# Patient Record
Sex: Female | Born: 1955 | Race: White | Hispanic: No | Marital: Married | Smoking: Current every day smoker
Health system: Southern US, Community
[De-identification: ages and names within clinical notes are randomized; demographics above are authoritative.]

## PROBLEM LIST (undated history)

## (undated) DIAGNOSIS — E049 Nontoxic goiter, unspecified: Secondary | ICD-10-CM

## (undated) DIAGNOSIS — J449 Chronic obstructive pulmonary disease, unspecified: Secondary | ICD-10-CM

## (undated) DIAGNOSIS — I10 Essential (primary) hypertension: Secondary | ICD-10-CM

## (undated) DIAGNOSIS — J45909 Unspecified asthma, uncomplicated: Secondary | ICD-10-CM

## (undated) DIAGNOSIS — N8502 Endometrial intraepithelial neoplasia [EIN]: Secondary | ICD-10-CM

## (undated) DIAGNOSIS — E538 Deficiency of other specified B group vitamins: Secondary | ICD-10-CM

## (undated) DIAGNOSIS — E039 Hypothyroidism, unspecified: Secondary | ICD-10-CM

## (undated) DIAGNOSIS — M109 Gout, unspecified: Secondary | ICD-10-CM

## (undated) DIAGNOSIS — R011 Cardiac murmur, unspecified: Secondary | ICD-10-CM

## (undated) DIAGNOSIS — B379 Candidiasis, unspecified: Secondary | ICD-10-CM

## (undated) HISTORY — DX: Gout, unspecified: M10.9

## (undated) HISTORY — DX: Unspecified asthma, uncomplicated: J45.909

## (undated) HISTORY — PX: APPENDECTOMY: SHX54

## (undated) HISTORY — DX: Essential (primary) hypertension: I10

## (undated) HISTORY — DX: Hypothyroidism, unspecified: E03.9

## (undated) HISTORY — DX: Nontoxic goiter, unspecified: E04.9

## (undated) HISTORY — PX: TONSILLECTOMY: SUR1361

## (undated) HISTORY — DX: Endometrial intraepithelial neoplasia (EIN): N85.02

---

## 1992-12-24 HISTORY — PX: NECK SURGERY: SHX720

## 2003-05-26 ENCOUNTER — Encounter: Payer: Self-pay | Admitting: Family Medicine

## 2003-05-26 ENCOUNTER — Ambulatory Visit (HOSPITAL_COMMUNITY): Admission: RE | Admit: 2003-05-26 | Discharge: 2003-05-26 | Payer: Self-pay | Admitting: Family Medicine

## 2003-06-29 ENCOUNTER — Encounter: Payer: Self-pay | Admitting: Family Medicine

## 2003-06-29 ENCOUNTER — Ambulatory Visit (HOSPITAL_COMMUNITY): Admission: RE | Admit: 2003-06-29 | Discharge: 2003-06-29 | Payer: Self-pay | Admitting: Family Medicine

## 2003-12-29 ENCOUNTER — Ambulatory Visit (HOSPITAL_COMMUNITY): Admission: RE | Admit: 2003-12-29 | Discharge: 2003-12-29 | Payer: Self-pay | Admitting: Family Medicine

## 2007-05-20 ENCOUNTER — Encounter (HOSPITAL_COMMUNITY): Admission: RE | Admit: 2007-05-20 | Discharge: 2007-05-21 | Payer: Self-pay | Admitting: Endocrinology

## 2007-12-25 HISTORY — PX: CHOLECYSTECTOMY: SHX55

## 2012-11-12 ENCOUNTER — Encounter: Payer: Self-pay | Admitting: Gynecologic Oncology

## 2012-11-12 ENCOUNTER — Ambulatory Visit: Payer: Self-pay | Admitting: Gynecologic Oncology

## 2012-11-12 ENCOUNTER — Ambulatory Visit: Payer: BC Managed Care – PPO | Attending: Gynecologic Oncology | Admitting: Gynecologic Oncology

## 2012-11-12 ENCOUNTER — Encounter (HOSPITAL_COMMUNITY): Payer: Self-pay | Admitting: Pharmacy Technician

## 2012-11-12 VITALS — BP 128/76 | HR 78 | Temp 98.4°F | Resp 22 | Ht 66.1 in | Wt 277.0 lb

## 2012-11-12 DIAGNOSIS — E669 Obesity, unspecified: Secondary | ICD-10-CM | POA: Insufficient documentation

## 2012-11-12 DIAGNOSIS — N8501 Benign endometrial hyperplasia: Secondary | ICD-10-CM

## 2012-11-12 DIAGNOSIS — N8502 Endometrial intraepithelial neoplasia [EIN]: Secondary | ICD-10-CM

## 2012-11-12 NOTE — Patient Instructions (Signed)
Return to clinic for pre-op visit.

## 2012-11-12 NOTE — Progress Notes (Signed)
Consult Note: Gyn-Onc  Christina Castaneda 56 y.o. female  CC:  Chief Complaint  Patient presents with  . Atypical Hyperplasia    New patient    HPI: Patient is seen today in consultation at the request of Dr. Tresa Res for complex atypical hyperplasia.  Patient is a 56 year old gravida 1 para 1 who had a son in 51 who she put up for adoption. She been menopausal for about 8 years and never took any hormone replacement therapy. In January of this year she began taking some aspirin intermittently for headaches or discomfort and she at that time noticed that she would have some spotting bleeding or even clotted blood per vagina and that it would then stop. She subsequently saw her primary physician Dr. Luster Landsberg had a Pap smear performed which was unremarkable. He then referred her to Dr. Tresa Res. She was seen by Dr. Tresa Res on November 5 and an endometrial biopsy was performed at that time that revealed complex atypical hyperplasia. It is for this reason that she comes in to see [place] today.  She is otherwise doing quite well and has no concerns. 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 The patient's father is there to be of assistance and hospice is also involved.   Review of Systems: She denies any chest pain, shortness of breath, nausea, vomiting, fevers, chills. She has a change in her bowel bladder habits. She has metastases greater than 4 she can go up a flight of steps. However, she states that she has to take them one step at a time as she continues to get used to her new glasses. She has lost in the last 3-4 years approximately 80 pounds. She states her thyroid has been increasingly improved with regards to regulation she's been able to lose weight though not completely intentionally. She does have a yeast infection on her pannus that she's been taking care of. She has chronic lower extremity edema left greater than right. Review of systems is otherwise negative. Of note she  does smoke a pack per day for the past 2 years. 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  Her last mammogram was 3 years ago her last colonoscopy was 6 years ago.  Current Meds:  Outpatient Encounter Prescriptions as of 11/12/2012  Medication Sig Dispense Refill  . albuterol (PROVENTIL HFA;VENTOLIN HFA) 108 (90 BASE) MCG/ACT inhaler Inhale 2 puffs into the lungs every 6 (six) hours as needed.      . ergocalciferol (VITAMIN D2) 50000 UNITS capsule Take 50,000 Units by mouth once a week.      . Febuxostat (ULORIC) 80 MG TABS Take by mouth daily.      Marland Kitchen l-methylfolate-B6-B12 (METANX) 3-35-2 MG TABS Take 1 tablet by mouth daily.      Marland Kitchen levothyroxine (SYNTHROID, LEVOTHROID) 200 MCG tablet Take 200 mcg by mouth daily.      Marland Kitchen triamterene-hydrochlorothiazide (MAXZIDE) 75-50 MG per tablet Take 1 tablet by mouth daily.        Allergy:  Allergies  Allergen Reactions  . Penicillins Hives, Shortness Of Breath and Swelling    Social Hx:   History   Social History  . Marital Status: Married    Spouse Name: N/A    Number of Children: N/A  . Years of Education: N/A   Occupational History  . Not on file.   Social History Main Topics  . Smoking status: Current  Every Day Smoker -- 1.0 packs/day for 28 years    Types: Cigarettes  . Smokeless tobacco: Not on file  . Alcohol Use: Yes     Comment: "couple beers on the weekend"  . Drug Use: No  . Sexually Active: Not on file   Other Topics Concern  . Not on file   Social History Narrative  . No narrative on file    Past Surgical Hx:  Past Surgical History  Procedure Date  . Cholecystectomy 2009    gallstone removed  . Neck surgery     replace disk  . Tonsillectomy     as a child    Past Medical Hx:  Past Medical History  Diagnosis Date  . Goiter   . Hypothyroidism   . Asthma     mild  . Hypertension   . Atypical endometrial hyperplasia     Family Hx:  Family History  Problem  Relation Age of Onset  . COPD Mother   . Diabetes Mother   . Congestive Heart Failure Mother   . Diabetes Father   . Heart attack Father     Vitals:  Blood pressure 128/76, pulse 78, temperature 98.4 F (36.9 C), temperature source Oral, resp. rate 22, height 5' 6.1" (1.679 m), weight 277 lb (125.646 kg).  Physical Exam:  Rash well-developed female in no acute distress. BMI is 35.  Neck: Supple, no lymphadenopathy, no thyromegaly.  Lungs: Clear to auscultation bilaterally.  Cardiovascular: Regular rate and rhythm.  Abdomen: Morbidly obese large pannus. There is evidence of erythema consistent with yeast under the pannus. There is no palpable masses or hepatosplenomegaly. Exam is limited by habitus. She does have well-healed incisions from her laparoscopic cholecystectomy. There is no obvious abdominal wall hernias.  Groins: No lymphadenopathy.  Extremities: She has 4+ nonpitting edema on the left leg. It is approximately 3+ on the right leg. She has 1+ distal pulses.  Pelvic: Normal external female genitalia. Vagina is atrophic. The cervix is visualized portions of prominent anterior lip the cervix is no visible lesions. Bimanual examination the corpus is not markedly enlarged and there are no adnexal masses. However, exam is limited by habitus.  Assessment/Plan: 56-year-old gravida 1 para 1 with postmenopausal bleeding. Endometrial biopsy of a complex atypical hyperplasia. She had previously discussed definitive surgery with Dr. Phylliss Bob mind in this is how she would like to proceed at this time. She understands that we'll proceed with a total hysterectomy bilateral salpingo-oophorectomy. While she is asleep the uterus we sent for frozen section. If there is either no evidence of cancer or cancer has less than 50% myometrial invasion will conclude the procedure. If there is a higher grade of cancer or there's greater than 50% myometrial invasion we'll need to proceed with attempt at  lymphadenectomy. She understands we'll attempt to do this in a minimally invasive fashion. The limitations to minimally invasive surgery that could decrease our success in performing a minimally invasive fashion include her morbid obesity as well as her smoking. I discussed with her the need for Trendelenburg position how this can be an issue in smokers who have reactive airway disease. She states that she can quit smoking tomorrow prior to her surgery. I also counseled her to stop taking any aspirin products prior surgery.  She understands as stated above that we'll attempt to proceed with surgery in a minimally invasive fashion. Should we not be able to the surgery via MIA, we will proceed with a vertical midline incision above  the area of erythema. Risks of the surgery included but are not limited to bleeding infection cervical and treated surrounding organs and thromboembolic disease. She understands the need for home Lovenox will depend on what type of incision was made and whether or not she has cancer. Her questions as well as those of her husband were elicited in answer to her satisfaction. She'll go to her pre-care system. She has our card and will call us if she has any questions prior to her surgery. Her surgery is tentatively scheduled for November 26.  Anel Creighton A., MD 11/12/2012, 12:27 PM

## 2012-11-14 ENCOUNTER — Encounter (HOSPITAL_COMMUNITY): Payer: Self-pay

## 2012-11-14 ENCOUNTER — Other Ambulatory Visit (HOSPITAL_COMMUNITY): Payer: Self-pay | Admitting: *Deleted

## 2012-11-14 ENCOUNTER — Other Ambulatory Visit: Payer: Self-pay | Admitting: *Deleted

## 2012-11-14 ENCOUNTER — Encounter (HOSPITAL_COMMUNITY)
Admission: RE | Admit: 2012-11-14 | Discharge: 2012-11-14 | Disposition: A | Discharge status: Home / Self Care | Payer: BC Managed Care – PPO | Source: Ambulatory Visit | Attending: Obstetrics & Gynecology | Admitting: Obstetrics & Gynecology

## 2012-11-14 ENCOUNTER — Ambulatory Visit (HOSPITAL_COMMUNITY)
Admission: RE | Admit: 2012-11-14 | Discharge: 2012-11-14 | Disposition: A | Discharge status: Home / Self Care | Payer: BC Managed Care – PPO | Source: Ambulatory Visit | Attending: Gynecologic Oncology | Admitting: Gynecologic Oncology

## 2012-11-14 DIAGNOSIS — Z01818 Encounter for other preprocedural examination: Secondary | ICD-10-CM | POA: Insufficient documentation

## 2012-11-14 HISTORY — DX: Deficiency of other specified B group vitamins: E53.8

## 2012-11-14 HISTORY — DX: Candidiasis, unspecified: B37.9

## 2012-11-14 HISTORY — DX: Cardiac murmur, unspecified: R01.1

## 2012-11-14 LAB — COMPREHENSIVE METABOLIC PANEL
ALT: 17 U/L (ref 0–35)
Alkaline Phosphatase: 69 U/L (ref 39–117)
BUN: 15 mg/dL (ref 6–23)
CO2: 28 mEq/L (ref 19–32)
GFR calc Af Amer: 57 mL/min — ABNORMAL LOW (ref >90)
GFR calc non Af Amer: 49 mL/min — ABNORMAL LOW (ref >90)
Glucose, Bld: 78 mg/dL (ref 70–99)
Potassium: 3.7 mEq/L (ref 3.5–5.1)
Total Bilirubin: 0.4 mg/dL (ref 0.3–1.2)
Total Protein: 7.4 g/dL (ref 6.0–8.3)

## 2012-11-14 LAB — CBC WITH DIFFERENTIAL/PLATELET
Eosinophils Absolute: 0.4 10*3/uL (ref 0.0–0.7)
Hemoglobin: 16.3 g/dL — ABNORMAL HIGH (ref 12.0–15.0)
Lymphocytes Relative: 27 % (ref 12–46)
Lymphs Abs: 2.5 10*3/uL (ref 0.7–4.0)
MCH: 30.8 pg (ref 26.0–34.0)
MCV: 90.9 fL (ref 78.0–100.0)
Monocytes Relative: 5 % (ref 3–12)
Neutrophils Relative %: 64 % (ref 43–77)
RBC: 5.29 MIL/uL — ABNORMAL HIGH (ref 3.87–5.11)
WBC: 9.4 10*3/uL (ref 4.0–10.5)

## 2012-11-14 LAB — SURGICAL PCR SCREEN: MRSA, PCR: NEGATIVE

## 2012-11-14 LAB — TYPE AND SCREEN: ABO/RH(D): O POS

## 2012-11-14 NOTE — Progress Notes (Signed)
Last office visit 04/21/12 note Dellie Catholic PA from [facility] cardiology on chart, ECHO 03/23/11 [facility] on chart.

## 2012-11-14 NOTE — Patient Instructions (Addendum)
20 CAELIE [address]URG  11/14/2012   Your procedure is scheduled on:  11-18-12  Report to [id number]  AM.  Call this number if you have problems the morning of surgery: [phone]   Remember:   Do not eat food :After Midnight. Sunday night.               Clear liquids all day Monday 11-17-2012 and clear liquids day of surgery 11-18-12 until 0745 am, then nothing.  .  Take these medicines the morning of surgery with A SIP OF WATER: synthroid, uloric albuterol inhaler if needed and bring inhaler   Do not wear jewelry or make up.  Do not wear lotions, powders, or perfumes. You may wear deodorant.    Do not bring valuables to the hospital.  Contacts, dentures or bridgework may not be worn into surgery.  Leave suitcase in the car. After surgery it may be brought to your room.  For patients admitted to the hospital, checkout time is 11:00 AM the day of discharge                             Patients discharged the day of surgery will not be allowed to drive home. If going home same day of surgery, you must have someone stay with you the first 24 hours at home and arrange for some one to drive you home from hospital.    Special Instructions: See [facility] Preparing for Surgery instruction sheet. Women do not shave legs or underarms for 12 hours before showers. Men may shave face morning of surgery.    Please read over the following fact sheets that you were given: MRSA Information, blood fact sheet, incentive spirometer fact sheet, clear liquid sheet.  Cain Sieve [facility] pre op nurse phone number [phone], call if needed

## 2012-11-14 NOTE — Progress Notes (Signed)
11/14/12 1002  OBSTRUCTIVE SLEEP APNEA  Have you ever been diagnosed with sleep apnea through a sleep study? No  Do you snore loudly (loud enough to be heard through closed doors)?  0  Do you often feel tired, fatigued, or sleepy during the daytime? 1  Has anyone observed you stop breathing during your sleep? 0  Do you have, or are you being treated for high blood pressure? 1  BMI more than 35 kg/m2? 1  Age over 58 years old? 1  Neck circumference greater than 40 cm/18 inches? 0  Gender: 0  Obstructive Sleep Apnea Score 4   Score 4 or greater  Results sent to PCP

## 2012-11-18 ENCOUNTER — Inpatient Hospital Stay (HOSPITAL_COMMUNITY): Payer: BC Managed Care – PPO | Admitting: Anesthesiology

## 2012-11-18 ENCOUNTER — Ambulatory Visit (HOSPITAL_COMMUNITY)
Admission: RE | Admit: 2012-11-18 | Discharge: 2012-11-19 | Disposition: A | Discharge status: Home / Self Care | Payer: BC Managed Care – PPO | Source: Ambulatory Visit | Attending: Obstetrics & Gynecology | Admitting: Obstetrics & Gynecology

## 2012-11-18 ENCOUNTER — Encounter (HOSPITAL_COMMUNITY)
Admission: RE | Disposition: A | Discharge status: Home / Self Care | Payer: Self-pay | Source: Ambulatory Visit | Attending: Obstetrics & Gynecology

## 2012-11-18 ENCOUNTER — Encounter (HOSPITAL_COMMUNITY): Payer: Self-pay | Admitting: *Deleted

## 2012-11-18 ENCOUNTER — Encounter (HOSPITAL_COMMUNITY): Payer: Self-pay | Admitting: Anesthesiology

## 2012-11-18 DIAGNOSIS — N7013 Chronic salpingitis and oophoritis: Secondary | ICD-10-CM | POA: Insufficient documentation

## 2012-11-18 DIAGNOSIS — N8000 Endometriosis of the uterus, unspecified: Secondary | ICD-10-CM | POA: Insufficient documentation

## 2012-11-18 DIAGNOSIS — E039 Hypothyroidism, unspecified: Secondary | ICD-10-CM | POA: Insufficient documentation

## 2012-11-18 DIAGNOSIS — I10 Essential (primary) hypertension: Secondary | ICD-10-CM | POA: Insufficient documentation

## 2012-11-18 DIAGNOSIS — J4489 Other specified chronic obstructive pulmonary disease: Secondary | ICD-10-CM | POA: Insufficient documentation

## 2012-11-18 DIAGNOSIS — J449 Chronic obstructive pulmonary disease, unspecified: Secondary | ICD-10-CM | POA: Insufficient documentation

## 2012-11-18 DIAGNOSIS — N8501 Benign endometrial hyperplasia: Secondary | ICD-10-CM

## 2012-11-18 DIAGNOSIS — Z23 Encounter for immunization: Secondary | ICD-10-CM | POA: Insufficient documentation

## 2012-11-18 DIAGNOSIS — C549 Malignant neoplasm of corpus uteri, unspecified: Principal | ICD-10-CM | POA: Insufficient documentation

## 2012-11-18 DIAGNOSIS — N135 Crossing vessel and stricture of ureter without hydronephrosis: Secondary | ICD-10-CM | POA: Insufficient documentation

## 2012-11-18 DIAGNOSIS — Z79899 Other long term (current) drug therapy: Secondary | ICD-10-CM | POA: Insufficient documentation

## 2012-11-18 HISTORY — PX: ROBOTIC ASSISTED TOTAL HYSTERECTOMY WITH BILATERAL SALPINGO OOPHERECTOMY: SHX6086

## 2012-11-18 SURGERY — ROBOTIC ASSISTED TOTAL HYSTERECTOMY WITH BILATERAL SALPINGO OOPHORECTOMY
Anesthesia: General | Wound class: Clean Contaminated

## 2012-11-18 MED ORDER — ZOLPIDEM TARTRATE 5 MG PO TABS: 5.0000 mg | ORAL_TABLET | Freq: Every evening | ORAL | Status: DC | PRN

## 2012-11-18 MED ORDER — HYDROMORPHONE HCL PF 1 MG/ML IJ SOLN
0.2500 mg | INTRAMUSCULAR | Status: DC | PRN
  Administered 2012-11-18 (×2): 0.5 mg via INTRAVENOUS

## 2012-11-18 MED ORDER — LACTATED RINGERS IV SOLN
INTRAVENOUS | Status: DC
  Administered 2012-11-18 (×2): via INTRAVENOUS
  Administered 2012-11-18: 1000 mL via INTRAVENOUS

## 2012-11-18 MED ORDER — MORPHINE SULFATE 2 MG/ML IJ SOLN
2.0000 mg | INTRAMUSCULAR | Status: DC | PRN
  Administered 2012-11-18: 2 mg via INTRAVENOUS
  Filled 2012-11-18: qty 1

## 2012-11-18 MED ORDER — METRONIDAZOLE IN NACL 5-0.79 MG/ML-% IV SOLN
500.0000 mg | INTRAVENOUS | Status: AC
  Administered 2012-11-18: 500 mg via INTRAVENOUS

## 2012-11-18 MED ORDER — GLYCOPYRROLATE 0.2 MG/ML IJ SOLN
INTRAMUSCULAR | Status: DC | PRN
  Administered 2012-11-18: .6 mg via INTRAVENOUS

## 2012-11-18 MED ORDER — ALBUTEROL SULFATE HFA 108 (90 BASE) MCG/ACT IN AERS
INHALATION_SPRAY | RESPIRATORY_TRACT | Status: AC
  Filled 2012-11-18: qty 6.7

## 2012-11-18 MED ORDER — SUCCINYLCHOLINE CHLORIDE 20 MG/ML IJ SOLN
INTRAMUSCULAR | Status: DC | PRN
  Administered 2012-11-18: 100 mg via INTRAVENOUS

## 2012-11-18 MED ORDER — MEPERIDINE HCL 50 MG/ML IJ SOLN: 6.2500 mg | INTRAMUSCULAR | Status: DC | PRN

## 2012-11-18 MED ORDER — ACETAMINOPHEN 10 MG/ML IV SOLN: 1000.0000 mg | Freq: Once | INTRAVENOUS | Status: DC | PRN

## 2012-11-18 MED ORDER — HYDROMORPHONE HCL PF 1 MG/ML IJ SOLN
INTRAMUSCULAR | Status: AC
  Filled 2012-11-18: qty 1

## 2012-11-18 MED ORDER — PNEUMOCOCCAL VAC POLYVALENT 25 MCG/0.5ML IJ INJ
0.5000 mL | INJECTION | INTRAMUSCULAR | Status: DC
  Filled 2012-11-18: qty 0.5

## 2012-11-18 MED ORDER — ONDANSETRON HCL 4 MG PO TABS: 4.0000 mg | ORAL_TABLET | Freq: Four times a day (QID) | ORAL | Status: DC | PRN

## 2012-11-18 MED ORDER — ALBUTEROL SULFATE HFA 108 (90 BASE) MCG/ACT IN AERS
2.0000 | INHALATION_SPRAY | Freq: Four times a day (QID) | RESPIRATORY_TRACT | Status: DC | PRN
  Filled 2012-11-18: qty 6.7

## 2012-11-18 MED ORDER — MORPHINE SULFATE 10 MG/ML IJ SOLN: 2.0000 mg | INTRAMUSCULAR | Status: DC | PRN

## 2012-11-18 MED ORDER — ALBUTEROL SULFATE HFA 108 (90 BASE) MCG/ACT IN AERS
INHALATION_SPRAY | RESPIRATORY_TRACT | Status: DC | PRN
  Administered 2012-11-18: 2 via RESPIRATORY_TRACT

## 2012-11-18 MED ORDER — LACTATED RINGERS IV SOLN
INTRAVENOUS | Status: DC | PRN
  Administered 2012-11-18: 500 mL via INTRAVENOUS

## 2012-11-18 MED ORDER — KCL IN DEXTROSE-NACL 20-5-0.45 MEQ/L-%-% IV SOLN
INTRAVENOUS | Status: DC
  Administered 2012-11-18 – 2012-11-19 (×2): via INTRAVENOUS
  Filled 2012-11-18 (×4): qty 1000

## 2012-11-18 MED ORDER — ONDANSETRON HCL 4 MG/2ML IJ SOLN
INTRAMUSCULAR | Status: DC | PRN
  Administered 2012-11-18: 4 mg via INTRAVENOUS

## 2012-11-18 MED ORDER — CIPROFLOXACIN IN D5W 400 MG/200ML IV SOLN
400.0000 mg | INTRAVENOUS | Status: AC
  Administered 2012-11-18: 400 mg via INTRAVENOUS

## 2012-11-18 MED ORDER — OXYCODONE HCL 5 MG PO TABS
5.0000 mg | ORAL_TABLET | Freq: Once | ORAL | Status: DC | PRN
Start: 2012-11-18 — End: 2012-11-18

## 2012-11-18 MED ORDER — ACETAMINOPHEN 10 MG/ML IV SOLN
INTRAVENOUS | Status: DC | PRN
  Administered 2012-11-18: 1000 mg via INTRAVENOUS

## 2012-11-18 MED ORDER — ROCURONIUM BROMIDE 100 MG/10ML IV SOLN: INTRAVENOUS | Status: DC | PRN

## 2012-11-18 MED ORDER — OXYCODONE-ACETAMINOPHEN 5-325 MG PO TABS
1.0000 | ORAL_TABLET | ORAL | Status: DC | PRN
  Administered 2012-11-19 (×2): 2 via ORAL
  Filled 2012-11-18 (×2): qty 2

## 2012-11-18 MED ORDER — PROMETHAZINE HCL 25 MG/ML IJ SOLN: 6.2500 mg | INTRAMUSCULAR | Status: DC | PRN

## 2012-11-18 MED ORDER — FENTANYL CITRATE 0.05 MG/ML IJ SOLN
INTRAMUSCULAR | Status: DC | PRN
  Administered 2012-11-18 (×3): 50 ug via INTRAVENOUS
  Administered 2012-11-18: 100 ug via INTRAVENOUS
  Administered 2012-11-18 (×3): 50 ug via INTRAVENOUS

## 2012-11-18 MED ORDER — KETOROLAC TROMETHAMINE 30 MG/ML IJ SOLN
30.0000 mg | Freq: Four times a day (QID) | INTRAMUSCULAR | Status: AC
  Filled 2012-11-18 (×2): qty 1

## 2012-11-18 MED ORDER — NEOSTIGMINE METHYLSULFATE 1 MG/ML IJ SOLN
INTRAMUSCULAR | Status: DC | PRN
  Administered 2012-11-18: 4 mg via INTRAVENOUS

## 2012-11-18 MED ORDER — PROPOFOL 10 MG/ML IV BOLUS
INTRAVENOUS | Status: DC | PRN
  Administered 2012-11-18: 150 mg via INTRAVENOUS

## 2012-11-18 MED ORDER — DEXAMETHASONE SODIUM PHOSPHATE 10 MG/ML IJ SOLN
INTRAMUSCULAR | Status: DC | PRN
  Administered 2012-11-18: 10 mg via INTRAVENOUS

## 2012-11-18 MED ORDER — ONDANSETRON HCL 4 MG/2ML IJ SOLN: 4.0000 mg | Freq: Four times a day (QID) | INTRAMUSCULAR | Status: DC | PRN

## 2012-11-18 MED ORDER — KETOROLAC TROMETHAMINE 30 MG/ML IJ SOLN
30.0000 mg | Freq: Four times a day (QID) | INTRAMUSCULAR | Status: AC
  Administered 2012-11-18 – 2012-11-19 (×3): 30 mg via INTRAVENOUS
  Filled 2012-11-18: qty 1

## 2012-11-18 MED ORDER — MIDAZOLAM HCL 5 MG/5ML IJ SOLN
INTRAMUSCULAR | Status: DC | PRN
  Administered 2012-11-18: 2 mg via INTRAVENOUS

## 2012-11-18 MED ORDER — ROCURONIUM BROMIDE 100 MG/10ML IV SOLN
INTRAVENOUS | Status: DC | PRN
  Administered 2012-11-18: 10 mg via INTRAVENOUS
  Administered 2012-11-18: 5 mg via INTRAVENOUS
  Administered 2012-11-18: 50 mg via INTRAVENOUS

## 2012-11-18 MED ORDER — OXYCODONE HCL 5 MG/5ML PO SOLN
5.0000 mg | Freq: Once | ORAL | Status: DC | PRN
  Filled 2012-11-18: qty 5

## 2012-11-18 SURGICAL SUPPLY — 51 items
APL SKNCLS STERI-STRIP NONHPOA (GAUZE/BANDAGES/DRESSINGS) ×1
BAG SPEC RTRVL LRG 6X4 10 (ENDOMECHANICALS) ×2
BENZOIN TINCTURE PRP APPL 2/3 (GAUZE/BANDAGES/DRESSINGS) ×2 IMPLANT
CHLORAPREP W/TINT 26ML (MISCELLANEOUS) ×3 IMPLANT
CLOTH BEACON ORANGE TIMEOUT ST (SAFETY) ×2 IMPLANT
CORD HIGH FREQUENCY UNIPOLAR (ELECTROSURGICAL) ×2 IMPLANT
CORDS BIPOLAR (ELECTRODE) ×1 IMPLANT
COVER MAYO STAND STRL (DRAPES) ×2 IMPLANT
COVER SURGICAL LIGHT HANDLE (MISCELLANEOUS) ×2 IMPLANT
COVER TIP SHEARS 8 DVNC (MISCELLANEOUS) ×1 IMPLANT
COVER TIP SHEARS 8MM DA VINCI (MISCELLANEOUS) ×1
DECANTER SPIKE VIAL GLASS SM (MISCELLANEOUS) ×2 IMPLANT
DRAPE LG THREE QUARTER DISP (DRAPES) ×4 IMPLANT
DRAPE SURG IRRIG POUCH 19X23 (DRAPES) ×2 IMPLANT
DRAPE TABLE BACK 44X90 PK DISP (DRAPES) ×3 IMPLANT
DRAPE UTILITY XL STRL (DRAPES) ×2 IMPLANT
DRAPE WARM FLUID 44X44 (DRAPE) ×2 IMPLANT
DRSG TEGADERM 6X8 (GAUZE/BANDAGES/DRESSINGS) ×4 IMPLANT
ELECT REM PT RETURN 9FT ADLT (ELECTROSURGICAL) ×2
ELECTRODE REM PT RTRN 9FT ADLT (ELECTROSURGICAL) ×1 IMPLANT
GAUZE VASELINE 3X9 (GAUZE/BANDAGES/DRESSINGS) IMPLANT
GLOVE BIO SURGEON STRL SZ 6.5 (GLOVE) ×8 IMPLANT
GLOVE BIO SURGEON STRL SZ7.5 (GLOVE) ×4 IMPLANT
GLOVE BIOGEL PI IND STRL 7.0 (GLOVE) ×2 IMPLANT
GLOVE BIOGEL PI INDICATOR 7.0 (GLOVE) ×2
GOWN PREVENTION PLUS XLARGE (GOWN DISPOSABLE) ×10 IMPLANT
HOLDER FOLEY CATH W/STRAP (MISCELLANEOUS) ×2 IMPLANT
KIT ACCESSORY DA VINCI DISP (KITS) ×1
KIT ACCESSORY DVNC DISP (KITS) ×1 IMPLANT
MANIPULATOR UTERINE 4.5 ZUMI (MISCELLANEOUS) ×2 IMPLANT
OCCLUDER COLPOPNEUMO (BALLOONS) ×2 IMPLANT
PACK LAPAROSCOPY W LONG (CUSTOM PROCEDURE TRAY) ×2 IMPLANT
POUCH SPECIMEN RETRIEVAL 10MM (ENDOMECHANICALS) ×4 IMPLANT
SET TUBE IRRIG SUCTION NO TIP (IRRIGATION / IRRIGATOR) ×2 IMPLANT
SHEET LAVH (DRAPES) ×2 IMPLANT
SOLUTION ELECTROLUBE (MISCELLANEOUS) ×2 IMPLANT
SPONGE LAP 18X18 X RAY DECT (DISPOSABLE) IMPLANT
STRIP CLOSURE SKIN 1/2X4 (GAUZE/BANDAGES/DRESSINGS) ×2 IMPLANT
SUT VIC AB 0 CT1 27 (SUTURE) ×6
SUT VIC AB 0 CT1 27XBRD ANTBC (SUTURE) ×3 IMPLANT
SUT VIC AB 4-0 PS2 27 (SUTURE) ×4 IMPLANT
SUT VICRYL 0 UR6 27IN ABS (SUTURE) ×2 IMPLANT
SYR 50ML LL SCALE MARK (SYRINGE) ×2 IMPLANT
SYR BULB IRRIGATION 50ML (SYRINGE) IMPLANT
TOWEL NATURAL 10PK STERILE (DISPOSABLE) ×2 IMPLANT
TOWEL OR NON WOVEN STRL DISP B (DISPOSABLE) ×1 IMPLANT
TRAP SPECIMEN MUCOUS 40CC (MISCELLANEOUS) ×2 IMPLANT
TRAY FOLEY CATH 14FRSI W/METER (CATHETERS) ×2 IMPLANT
TROCAR XCEL 12X100 BLDLESS (ENDOMECHANICALS) ×2 IMPLANT
TROCAR XCEL BLADELESS 5X75MML (TROCAR) ×2 IMPLANT
WATER STERILE IRR 1500ML POUR (IV SOLUTION) ×4 IMPLANT

## 2012-11-18 NOTE — Op Note (Signed)
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 Duard Brady, MD; Antionette Char, MD   Assistant: Telford Nab   Anesthesia: General   Estimated blood loss: 50 ml   IVF: 1500 ml   Urine output: 100 ml   Complications: None   Pathology: Uterus, cervix, bilateral tubes and ovaries  Operative findings: Bilateral hydrosalpinges. The left measured approximately 6 cm in the right approximately 4 cm. Significant adhesive disease of the omentum to the left adnexa and the posterior cul-de-sac. Endometriotic implant on the right pelvic sidewall. 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 Normal size uterus. Significant intra-abdominal obesity. 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   Procedure: The patient was identified in the preoperative holding area. Informed consent was signed on the chart. Patient was seen history was reviewed and exam was performed.   The patient was then taken to the operating room and placed in the supine position with SCD hose on. General anesthesia was then induced without difficulty. She was then placed in the dorsolithotomy position. Her arms were tucked at her side with appropriate precautions on the gel pad and sleds. Shoulder blocks were then placed in the usual fashion with appropriate precautions. A OG-tube was placed to suction. First timeout was performed to confirm the patient procedure antibiotic allergy status, estimated estimated blood loss and OR time. The perineum was then prepped in the usual fashion with  Betadine. A 14 French Foley was inserted into the bladder under sterile conditions. A sterile speculum was placed in the vagina. The cervix was without lesions. The cervix was grasped with a single-tooth tenaculum. The dilator without difficulty. A ZUMI with a large Koe ring was placed without difficulty. The abdomen was then prepped with 2 Chlor prep sponges per protocol.  Patient was then draped after the prep was dried.   Second timeout was performed to confirm the above. After again confirming OG tube placement and it was to suction. A stab-wound was made in left upper quadrant 2 cm below the costal margin on the left in the midclavicular line. A 5 mm operative report was used to assure intra-abdominal placement. The abdomen was insufflated. At this point all points during the procedure the patient's intra-abdominal pressure was not increased over 15 mm of mercury. After insufflation was complete, the patient was placed in deep Trendelenburg position. 25 cm above the pubic symphysis that area was marked the camera port. Bilateral robotic ports were marked 10 cm from the midline incision at approximately 5 angle and a fourth trochars placed approximately 8 cm away from the third arm in a straight line. Under direct visualization each of the trochars was placed into the abdomen. We could not fold the small bowel back on its mesentery secondary to the significant adhesive disease of the omentum to the pelvis. The 5 mm LUQ port was then converted to a 10/12 port under direct visualization.  After assuring adequate visualization, the robot was then docked in the usual fashion. Under direct visualization the robotic instruments replaced.   Using careful dissection the omentum was dissected off the descending colon, the left adnexa, the left hydrosalpinx, the posterior cul-de-sac, and the right ovary. The descending  colon was then dissected free of the left lateral side wall and the left adnexa. The round  ligament on the patient's left side was transected with monopolar cautery. The anterior and posterior leaves of the broad ligament were then taken down in the usual fashion. The ureter was identified on the medial leaf of the broad ligament. A window was made between the IP and the ureter. The IP was coagulated with bipolar cautery and transected. The posterior leaf of the broad ligament was taken down to the level of the KOH ring. The bladder flap was created using meticulous dissection and pinpoint cautery. The uterine vessels were coagulated with bipolar cautery. The uterine vessels were then transected and the C loop was created. The same procedure was performed on the patient's right side except that prior to making a window between the ureter and the infundibulopelvic vessels,the ureter was dissected off the medial leaf of the broad ligament. The paravesical space on the patient's right side was opened to assist with the identification of the ureter. The ovary was densely adherent to the medial leaf of the peritoneum and the posterior ovarian fossa. This was dissected free to ensure visualization of the ureter at all times. At this point and the window could be created between the ureter and the infundibulopelvic vessels to. The vessels were then cauterized and transected. The bladder flap was completed anteriorly. The uterine vessels were skeletonized on the patient's right side and then coagulated with bipolar cautery. The uterine vessels were transected and the C-loop was created.  The pneumo-occulder in the vagina was then insufflated. The colpotomy was then created in the usual fashion. The specimen was then delivered to the vagina and sent for frozen section.  The vaginal cuff was closed with a running 0 Vicryl on CT 1 suture. At this point frozen section returned as minimal myometrial invasion of grade 1 lesion. The abdomen and pelvis were copiously irrigated and noted to be hemostatic.   The  robotic instruments were removed under direct visualization as were the robotic trochars. The pneumoperitoneum was removed. The patient was then taken out of the Trendelenburg position. Anesthesia gave the patient deep breath took spell the CO2 gas in the abdomen.  S retractors were used to identify the fascia the midline port. It was grasped with Allis clamps and closed in a figure-of-eight fashion with 0 Vicryl on a UR 6 needle. The left upper quadrant subcutaneous tissues were reapproximated with 0 Vicryl on a UR 6.The skin was closed using 4-0 Vicryl. Steri-Strips and benzoin were applied. The pneumo occluded balloon was removed from the vagina. The vagina was swabbed and noted to be hemostatic.    All instrument needle and Ray-Tec counts were correct x2. The patient tolerated the procedure well and was taken to the recovery room in stable condition. This is Lynsey Ange dictating an operative note on patient Samar Cleaton Sundell.

## 2012-11-18 NOTE — Anesthesia Preprocedure Evaluation (Addendum)
Anesthesia Evaluation  Patient identified by MRN, date of birth, ID band Patient awake    Reviewed: Allergy & Precautions, H&P , NPO status , Patient's Chart, lab work & pertinent test results  Airway Mallampati: II TM Distance: >3 FB Neck ROM: Full    Dental  (+) Dental Advisory Given, Teeth Intact and Missing   Pulmonary asthma , COPD COPD inhaler,  breath sounds clear to auscultation  Pulmonary exam normal       Cardiovascular hypertension, Pt. on medications - CAD and - Past MI Rhythm:Regular Rate:Normal + Systolic murmurs    Neuro/Psych negative neurological ROS  negative psych ROS   GI/Hepatic negative GI ROS, Neg liver ROS,   Endo/Other  Hypothyroidism Morbid obesity  Renal/GU negative Renal ROS     Musculoskeletal negative musculoskeletal ROS (+)   Abdominal (+) + obese,   Peds  Hematology negative hematology ROS (+)   Anesthesia Other Findings   Reproductive/Obstetrics                        Anesthesia Physical Anesthesia Plan  ASA: III  Anesthesia Plan: General   Post-op Pain Management:    Induction: Intravenous  Airway Management Planned: Oral ETT  Additional Equipment:   Intra-op Plan:   Post-operative Plan: Extubation in OR  Informed Consent: I have reviewed the patients History and Physical, chart, labs and discussed the procedure including the risks, benefits and alternatives for the proposed anesthesia with the patient or authorized representative who has indicated his/her understanding and acceptance.   Dental advisory given  Plan Discussed with: CRNA  Anesthesia Plan Comments:         Anesthesia Quick Evaluation

## 2012-11-18 NOTE — Anesthesia Postprocedure Evaluation (Signed)
Anesthesia Post Note  Patient: Christina Castaneda  Procedure(s) Performed: Procedure(s) (LRB): ROBOTIC ASSISTED TOTAL HYSTERECTOMY WITH BILATERAL SALPINGO OOPHORECTOMY (N/A)  Anesthesia type: General  Patient location: PACU  Post pain: Pain level controlled  Post assessment: Post-op Vital signs reviewed  Last Vitals: BP 111/47  Pulse 43  Temp 36.3 C (Oral)  Resp 13  SpO2 96%  Post vital signs: Reviewed  Level of consciousness: sedated  Complications: No apparent anesthesia complications

## 2012-11-18 NOTE — Interval H&P Note (Signed)
History and Physical Interval Note:  11/18/2012 12:16 PM  Christina Castaneda  has presented today for surgery, with the diagnosis of complex hyperplasia  The various methods of treatment have been discussed with the patient and family. After consideration of risks, benefits and other options for treatment, the patient has consented to  Procedure(s) (LRB) with comments: ROBOTIC ASSISTED TOTAL HYSTERECTOMY WITH BILATERAL SALPINGO OOPHORECTOMY (N/A) as a surgical intervention .  The patient's history has been reviewed, patient examined, no change in status, stable for surgery.  I have reviewed the patient's chart and labs.  Questions were answered to the patient's satisfaction.     Kegan Shepardson A.

## 2012-11-18 NOTE — Transfer of Care (Signed)
Immediate Anesthesia Transfer of Care Note  Patient: Christina Castaneda  Procedure(s) Performed: Procedure(s) (LRB) with comments: ROBOTIC ASSISTED TOTAL HYSTERECTOMY WITH BILATERAL SALPINGO OOPHORECTOMY (N/A)  Patient Location: PACU  Anesthesia Type:General  Level of Consciousness: awake, alert , oriented and patient cooperative  Airway & Oxygen Therapy: Patient Spontanous Breathing and Patient connected to face mask oxygen  Post-op Assessment: Report given to PACU RN and Post -op Vital signs reviewed and stable  Post vital signs: Reviewed and stable  Complications: No apparent anesthesia complications

## 2012-11-18 NOTE — Preoperative (Signed)
Beta Blockers   Reason not to administer Beta Blockers:Not Applicable 

## 2012-11-18 NOTE — H&P (View-Only) (Signed)
Consult Note: Gyn-Onc  Christina Castaneda 56 y.o. female  CC:  Chief Complaint  Patient presents with  . Atypical Hyperplasia    New patient    HPI: Patient is seen today in consultation at the request of Dr. Romine for complex atypical hyperplasia.  Patient is a 48-year-old gravida 1 para 1 who had a son in 1974 who she put up for adoption. She been menopausal for about 8 years and never took any hormone replacement therapy. In January of this year she began taking some aspirin intermittently for headaches or discomfort and she at that time noticed that she would have some spotting bleeding or even clotted blood per vagina and that it would then stop. She subsequently saw her primary physician Dr. Berger had a Pap smear performed which was unremarkable. He then referred her to Dr. Romine. She was seen by Dr. Romine on November 5 and an endometrial biopsy was performed at that time that revealed complex atypical hyperplasia. It is for this reason that she comes in to see us today.  She is otherwise doing quite well and has no concerns. 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 The patient's father is there to be of assistance and hospice is also involved.   Review of Systems: She denies any chest pain, shortness of breath, nausea, vomiting, fevers, chills. She has a change in her bowel bladder habits. She has metastases greater than 4 she can go up a flight of steps. However, she states that she has to take them one step at a time as she continues to get used to her new glasses. She has lost in the last 3-4 years approximately 80 pounds. She states her thyroid has been increasingly improved with regards to regulation she's been able to lose weight though not completely intentionally. She does have a yeast infection on her pannus that she's been taking care of. She has chronic lower extremity edema left greater than right. Review of systems is otherwise negative. Of note she  does smoke a pack per day for the past 2 years. 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  Her last mammogram was 3 years ago her last colonoscopy was 6 years ago.  Current Meds:  Outpatient Encounter Prescriptions as of 11/12/2012  Medication Sig Dispense Refill  . albuterol (PROVENTIL HFA;VENTOLIN HFA) 108 (90 BASE) MCG/ACT inhaler Inhale 2 puffs into the lungs every 6 (six) hours as needed.      . ergocalciferol (VITAMIN D2) 50000 UNITS capsule Take 50,000 Units by mouth once a week.      . Febuxostat (ULORIC) 80 MG TABS Take by mouth daily.      . l-methylfolate-B6-B12 (METANX) 3-35-2 MG TABS Take 1 tablet by mouth daily.      . levothyroxine (SYNTHROID, LEVOTHROID) 200 MCG tablet Take 200 mcg by mouth daily.      . triamterene-hydrochlorothiazide (MAXZIDE) 75-50 MG per tablet Take 1 tablet by mouth daily.        Allergy:  Allergies  Allergen Reactions  . Penicillins Hives, Shortness Of Breath and Swelling    Social Hx:   History   Social History  . Marital Status: Married    Spouse Name: N/A    Number of Children: N/A  . Years of Education: N/A   Occupational History  . Not on file.   Social History Main Topics  . Smoking status: Current   Every Day Smoker -- 1.0 packs/day for 28 years    Types: Cigarettes  . Smokeless tobacco: Not on file  . Alcohol Use: Yes     Comment: "couple beers on the weekend"  . Drug Use: No  . Sexually Active: Not on file   Other Topics Concern  . Not on file   Social History Narrative  . No narrative on file    Past Surgical Hx:  Past Surgical History  Procedure Date  . Cholecystectomy 2009    gallstone removed  . Neck surgery     replace disk  . Tonsillectomy     as a child    Past Medical Hx:  Past Medical History  Diagnosis Date  . Goiter   . Hypothyroidism   . Asthma     mild  . Hypertension   . Atypical endometrial hyperplasia     Family Hx:  Family History  Problem  Relation Age of Onset  . COPD Mother   . Diabetes Mother   . Congestive Heart Failure Mother   . Diabetes Father   . Heart attack Father     Vitals:  Blood pressure 128/76, pulse 78, temperature 98.4 F (36.9 C), temperature source Oral, resp. rate 22, height 5' 6.1" (1.679 m), weight 277 lb (125.646 kg).  Physical Exam:  Rash well-developed female in no acute distress. BMI is 35.  Neck: Supple, no lymphadenopathy, no thyromegaly.  Lungs: Clear to auscultation bilaterally.  Cardiovascular: Regular rate and rhythm.  Abdomen: Morbidly obese large pannus. There is evidence of erythema consistent with yeast under the pannus. There is no palpable masses or hepatosplenomegaly. Exam is limited by habitus. She does have well-healed incisions from her laparoscopic cholecystectomy. There is no obvious abdominal wall hernias.  Groins: No lymphadenopathy.  Extremities: She has 4+ nonpitting edema on the left leg. It is approximately 3+ on the right leg. She has 1+ distal pulses.  Pelvic: Normal external female genitalia. Vagina is atrophic. The cervix is visualized portions of prominent anterior lip the cervix is no visible lesions. Bimanual examination the corpus is not markedly enlarged and there are no adnexal masses. However, exam is limited by habitus.  Assessment/Plan: D6-year-old gravida 1 para 1 with postmenopausal bleeding. Endometrial biopsy of a complex atypical hyperplasia. She had previously discussed definitive surgery with Dr. Rowe mind in this is how she would like to proceed at this time. She understands that we'll proceed with a total hysterectomy bilateral salpingo-oophorectomy. While she is asleep the uterus we sent for frozen section. If there is either no evidence of cancer or cancer has less than 50% myometrial invasion will conclude the procedure. If there is a higher grade of cancer or there's greater than 50% myometrial invasion we'll need to proceed with attempt at  lymphadenectomy. She understands we'll attempt to do this in a minimally invasive fashion. The limitations to minimally invasive surgery that could decrease our success in performing a minimally invasive fashion include her morbid obesity as well as her smoking. I discussed with her the need for Trendelenburg position how this can be an issue in smokers who have reactive airway disease. She states that she can quit smoking tomorrow prior to her surgery. I also counseled her to stop taking any aspirin products prior surgery.  She understands as stated above that we'll attempt to proceed with surgery in a minimally invasive fashion. Should we not be able to the surgery via MIA, we will proceed with a vertical midline incision above   the area of erythema. Risks of the surgery included but are not limited to bleeding infection cervical and treated surrounding organs and thromboembolic disease. She understands the need for home Lovenox will depend on what type of incision was made and whether or not she has cancer. Her questions as well as those of her husband were elicited in answer to her satisfaction. She'll go to her pre-care system. She has our card and will call us if she has any questions prior to her surgery. Her surgery is tentatively scheduled for November 26.  Axell Trigueros A., MD 11/12/2012, 12:27 PM   

## 2012-11-19 ENCOUNTER — Encounter (HOSPITAL_COMMUNITY): Payer: Self-pay | Admitting: Gynecologic Oncology

## 2012-11-19 LAB — BASIC METABOLIC PANEL
BUN: 16 mg/dL (ref 6–23)
CO2: 24 mEq/L (ref 19–32)
Calcium: 8.8 mg/dL (ref 8.4–10.5)
Glucose, Bld: 160 mg/dL — ABNORMAL HIGH (ref 70–99)
Sodium: 131 mEq/L — ABNORMAL LOW (ref 135–145)

## 2012-11-19 MED ORDER — OXYCODONE-ACETAMINOPHEN 5-325 MG PO TABS: 1.0000 | ORAL_TABLET | ORAL | Status: DC | PRN

## 2012-11-19 NOTE — Discharge Summary (Signed)
Physician Discharge Summary  Patient ID: Christina Castaneda MRN: [id number] DOB/AGE: Jun 20, 1956 56 y.o.  Admit date: 11/18/2012 Discharge date: 11/19/2012  Admission Diagnoses: Complex endometrial hyperplasia  Discharge Diagnoses:  Principal Problem:  *Complex endometrial hyperplasia  Discharged Condition: The patient is in good condition and stable for discharge.  Hospital Course:  On 11/18/2012, the patient underwent the following: Procedure(s): ROBOTIC ASSISTED TOTAL HYSTERECTOMY WITH BILATERAL SALPINGO OOPHORECTOMY.  The postoperative course was uneventful.  She was discharged to home on postoperative day 1 tolerating a regular diet.  Consults: None  Significant Diagnostic Studies: None  Treatments: surgery: See above  Discharge Exam: Blood pressure 141/73, pulse 54, temperature 98.2 F (36.8 C), temperature source Oral, resp. rate 18, height 5\' 6"  (1.676 m), weight 277 lb 15.9 oz (126.098 kg), SpO2 94.00%. General appearance: alert, cooperative and no distress Resp: clear to auscultation bilaterally Cardio: bradycardic, HR 55- pt asymptomatic, systolic murmur noted GI: soft, non-tender; bowel sounds normal; no masses,  no organomegaly and obese, +flatus Extremities: extremities normal, atraumatic, no cyanosis or edema Incision/Wound: Lap sites with steri strips clean, dry, and intact  Disposition: Final discharge disposition not confirmed  Discharge Orders    Future Orders Please Complete By Expires   Diet - low sodium heart healthy      Increase activity slowly      Driving Restrictions      Comments:   No driving for 1-2 weeks.  Do not take narcotics and drive.   Lifting restrictions      Comments:   No lifting greater than 10 lbs.   Sexual Activity Restrictions      Comments:   No sexual activity, nothing in the vagina, for 8 weeks.   Call MD for:  temperature >100.4      Call MD for:  persistant nausea and vomiting      Call MD for:  severe uncontrolled pain       Call MD for:  redness, tenderness, or signs of infection (pain, swelling, redness, odor or green/yellow discharge around incision site)      Call MD for:  difficulty breathing, headache or visual disturbances      Call MD for:  hives      Call MD for:  persistant dizziness or light-headedness      Call MD for:  extreme fatigue          Medication List     As of 11/19/2012  9:22 AM    TAKE these medications         albuterol 108 (90 BASE) MCG/ACT inhaler   Commonly known as: PROVENTIL HFA;VENTOLIN HFA   Inhale 2 puffs into the lungs every 6 (six) hours as needed. Wheezing and shortness of breath      ergocalciferol 50000 UNITS capsule   Commonly known as: VITAMIN D2   Take 50,000 Units by mouth once a week.      l-methylfolate-B6-B12 3-35-2 MG Tabs   Commonly known as: METANX   Take 1 tablet by mouth daily.      levothyroxine 200 MCG tablet   Commonly known as: SYNTHROID, LEVOTHROID   Take 200 mcg by mouth daily before breakfast.      nystatin cream   Commonly known as: MYCOSTATIN   Apply topically 2 (two) times daily. For yeast infection      oxyCODONE-acetaminophen 5-325 MG per tablet   Commonly known as: PERCOCET/ROXICET   Take 1-2 tablets by mouth every 4 (four) hours as needed (for moderate to  severe pain.).      triamterene-hydrochlorothiazide 75-50 MG per tablet   Commonly known as: MAXZIDE   Take 1 tablet by mouth daily before breakfast.      ULORIC 80 MG Tabs   Generic drug: Febuxostat   Take 80 mg by mouth daily before breakfast.           Follow-up Information    Please follow up. (GYN Oncology will call you with the final pathology report and to schedule a follow up appointment.)          Signed: Damyah Gugel DEAL 11/19/2012, 9:22 AM

## 2013-02-05 ENCOUNTER — Ambulatory Visit: Payer: BC Managed Care – PPO | Admitting: Gynecologic Oncology

## 2013-03-05 ENCOUNTER — Encounter: Payer: Self-pay | Admitting: Gynecologic Oncology

## 2013-03-05 ENCOUNTER — Ambulatory Visit: Payer: BC Managed Care – PPO | Attending: Gynecologic Oncology | Admitting: Gynecologic Oncology

## 2013-03-05 VITALS — BP 128/68 | HR 68 | Temp 98.4°F | Resp 18 | Ht 66.0 in | Wt 276.7 lb

## 2013-03-05 DIAGNOSIS — Z90710 Acquired absence of both cervix and uterus: Secondary | ICD-10-CM | POA: Insufficient documentation

## 2013-03-05 DIAGNOSIS — C549 Malignant neoplasm of corpus uteri, unspecified: Secondary | ICD-10-CM | POA: Insufficient documentation

## 2013-03-05 DIAGNOSIS — J45909 Unspecified asthma, uncomplicated: Secondary | ICD-10-CM | POA: Insufficient documentation

## 2013-03-05 DIAGNOSIS — Z9079 Acquired absence of other genital organ(s): Secondary | ICD-10-CM | POA: Insufficient documentation

## 2013-03-05 DIAGNOSIS — Z79899 Other long term (current) drug therapy: Secondary | ICD-10-CM | POA: Insufficient documentation

## 2013-03-05 DIAGNOSIS — E039 Hypothyroidism, unspecified: Secondary | ICD-10-CM | POA: Insufficient documentation

## 2013-03-05 DIAGNOSIS — C541 Malignant neoplasm of endometrium: Secondary | ICD-10-CM

## 2013-03-05 DIAGNOSIS — I10 Essential (primary) hypertension: Secondary | ICD-10-CM | POA: Insufficient documentation

## 2013-03-05 NOTE — Patient Instructions (Signed)
Please schedule your mammogram.  See Dr. Tresa Res in 6 months and return to see [place] in one year.

## 2013-03-05 NOTE — Progress Notes (Signed)
Consult Note: Gyn-Onc  Christina Castaneda 57 y.o. female  CC:  Chief Complaint  Patient presents with  . Endometrial ca    Follow up    HPI:  Patient is a 57 year old gravida 1 para 1 who had a son in 79 who she put up for adoption. She been menopausal for about 8 years and never took any hormone replacement therapy. In January of this year she began taking some aspirin intermittently for headaches or discomfort and she at that time noticed that she would have some spotting bleeding or even clotted blood per vagina and that it would then stop. She subsequently saw her primary physician Dr. Luster Landsberg had a Pap smear performed which was unremarkable. He then referred her to Dr. Tresa Res. She was seen by Dr. Tresa Res on November 5 and an endometrial biopsy was performed at that time that revealed complex atypical hyperplasia.   She underwent a diagnostic laparoscopy total robotic hysterectomy bilateral salpingo-oophorectomy extensive lysis of her adhesions for 1 hour and right ureteral lysis on November 26. Operative findings included bilateral house hydrosalpinges. The left measured proximal B6 centimeters and on the right 4 cm. She has significant adhesive disease of the omentum to the left adnexa and the posterior cul-de-sac. Should endometriotic appearing implant on the right pelvic sidewall. 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 She did well postoperatively. Final pathology revealed:    Uterus and cervix, bilateral ovaries and fallopian tubes - ENDOMETRIOID ADENOCARCINOMA, FIGO GRADE I, ARISING IN A BACKGROUND OF EXTENSIVE ATYPICAL COMPLEX ENDOMETRIAL HYPERPLASIA, SUPERFICIALLY INVOLVING THE UNDERLYING MYOMETRIUM, CONFINED WITH INNER HALF OF THE MYOMETRIUM. - CERVIX: BENIGN SQUAMOUS MUCOSA AND ENDOCERVICAL MUCOSA, NO DYSPLASIA OR MALIGNANCY. - LEIOMYOMA. - BILATERAL OVARIES: BENIGN OVARIAN TISSUE WITH ENDOMETRIOSIS AND ENDOSALPINGIOSIS, NO ATYPIA OR MALIGNANCY. - BILATERAL FALLOPIAN  TUBES: BENIGN FALLOPIAN TUBAL TISSUE WITH ASSOCIATED ENDOMETRIOSIS, NO ATYPIA, OR MALIGNANCY.   Myometrial invasion: 0.1 cm where myometrium is 2.0 cm in thickness Lymph vascular invasion: Not identified.  Interval History:  She comes in today for her postop check as she had missed appointments. She's doing very well and is a very uneventful recovery from her surgery. She does have some issues with him something " close to" stool incontinence but never actually has had any stool incontinence. She continues to smoke but is at approximately 15 cigarettes per day. She has any vaginal bleeding any change about bladder habits other than mentioned above. She denies any abdominal or pelvic pain. She's not yet had a mammogram with her last one being 3 years ago. She does have yeast infection under her pannus.  Current Meds:  Outpatient Encounter Prescriptions as of 03/05/2013  Medication Sig Dispense Refill  . albuterol (PROVENTIL HFA;VENTOLIN HFA) 108 (90 BASE) MCG/ACT inhaler Inhale 2 puffs into the lungs every 6 (six) hours as needed. Wheezing and shortness of breath      . ergocalciferol (VITAMIN D2) 50000 UNITS capsule Take 50,000 Units by mouth once a week.      . Febuxostat (ULORIC) 80 MG TABS Take 80 mg by mouth daily before breakfast.       . l-methylfolate-B6-B12 (METANX) 3-35-2 MG TABS Take 1 tablet by mouth daily.      Marland Kitchen levothyroxine (SYNTHROID, LEVOTHROID) 200 MCG tablet Take 200 mcg by mouth daily before breakfast.       . nystatin cream (MYCOSTATIN) Apply topically 2 (two) times daily. For yeast infection      . triamterene-hydrochlorothiazide (MAXZIDE) 75-50 MG per tablet Take 1 tablet by mouth  daily before breakfast.       . oxyCODONE-acetaminophen (PERCOCET/ROXICET) 5-325 MG per tablet Take 1-2 tablets by mouth every 4 (four) hours as needed (for moderate to severe pain.).  45 tablet  0   No facility-administered encounter medications on file as of 03/05/2013.    Allergy:  Allergies   Allergen Reactions  . Penicillins Hives, Shortness Of Breath and Swelling    Social Hx:   History   Social History  . Marital Status: Married    Spouse Name: N/A    Number of Children: N/A  . Years of Education: N/A   Occupational History  . Not on file.   Social History Main Topics  . Smoking status: Current Every Day Smoker -- 1.00 packs/day for 28 years    Types: Cigarettes  . Smokeless tobacco: Never Used  . Alcohol Use: Yes     Comment: "couple beers on the weekend"  . Drug Use: No  . Sexually Active: Not on file   Other Topics Concern  . Not on file   Social History Narrative  . No narrative on file    Past Surgical Hx:  Past Surgical History  Procedure Laterality Date  . Neck surgery      replace disk  . Tonsillectomy      as a child  . Cholecystectomy  2009    gallstone removed  . Robotic assisted total hysterectomy with bilateral salpingo oopherectomy  11/18/2012    Procedure: ROBOTIC ASSISTED TOTAL HYSTERECTOMY WITH BILATERAL SALPINGO OOPHORECTOMY;  Surgeon: Rejeana Brock A. Duard Brady, MD;  Location: [facility];  Service: Gynecology;  Laterality: N/A;    Past Medical Hx:  Past Medical History  Diagnosis Date  . Goiter   . Hypothyroidism   . Asthma     mild  . Hypertension   . Atypical endometrial hyperplasia   . Heart murmur   . Vitamin B12 deficiency   . Yeast infection     for last 3 weeks    Family Hx:  Family History  Problem Relation Age of Onset  . COPD Mother   . Diabetes Mother   . Congestive Heart Failure Mother   . Diabetes Father   . Heart attack Father     Vitals:  Blood pressure 128/68, pulse 68, temperature 98.4 F (36.9 C), temperature source Oral, resp. rate 18, height 5\' 6"  (1.676 m), weight 276 lb 11.2 oz (125.51 kg).  Physical Exam:  Well-nourished well-developed female in no acute distress.  Abdomen: Well-healed laparoscopic skin incisions. There is no evidence of a laparoscopic R. incision site hernias. Abdomen is morbidly  obese. There is no masses or hepatosplenomegaly but exam is limited by habitus. Under her pannus there is erythema consistent with Candida.  Pelvic: Normal external female genitalia. Vagina is atrophic. The vaginal cuff is well-healed. There is no masses or nodularity rectal confirms.  Assessment/Plan: 56 CD59 her with a stage IA grade 1 endometrioid adenocarcinoma status post surgery November 2013 who clinically is doing well postoperatively has no evidence of persistent or recurrent disease.  She will followup with Dr. Tresa Res in 6 months return to see me in one year.  Demri Poulton A., MD 03/05/2013, 2:20 PM

## 2013-12-04 ENCOUNTER — Ambulatory Visit: Payer: Self-pay | Admitting: Certified Nurse Midwife

## 2014-01-06 ENCOUNTER — Encounter: Payer: Self-pay | Admitting: Certified Nurse Midwife

## 2014-01-07 ENCOUNTER — Encounter: Payer: Self-pay | Admitting: Certified Nurse Midwife

## 2014-01-07 ENCOUNTER — Ambulatory Visit (INDEPENDENT_AMBULATORY_CARE_PROVIDER_SITE_OTHER): Payer: BC Managed Care – PPO | Admitting: Certified Nurse Midwife

## 2014-01-07 VITALS — BP 110/70 | HR 72 | Resp 16 | Ht 66.25 in | Wt 305.0 lb

## 2014-01-07 DIAGNOSIS — Z0000 Encounter for general adult medical examination without abnormal findings: Secondary | ICD-10-CM

## 2014-01-07 DIAGNOSIS — Z01419 Encounter for gynecological examination (general) (routine) without abnormal findings: Secondary | ICD-10-CM

## 2014-01-07 NOTE — Progress Notes (Signed)
58 y.o. G64P1001 Married Caucasian Fe here for annual exam. Menopausal no HRT. Denies vaginal dryness or issues. Sees [facility] for all labs and medication management of asthma, hypothyroid,hypertension and following vitamin B12 deficiency. Recovered fine from Robotic hysterectomy and oophorectomy for atypical hyperplasia and endometrial hyperplasia, done by Dr.Gehrig in 11/13.Marland KitchenHappy with referral and care. 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 No health issues today.  Patient's last menstrual period was 11/18/2012.          Sexually active: yes  The current method of family planning is status post hysterectomy.    Exercising: no  exercise Smoker:  yes  Health Maintenance: Pap:  2013 done by dr berger (unremarkable) MMG:  2007 plans to schedule this year Colonoscopy:  2008 polyps 5 years,aware due schedules with PCP BMD:   Scheduled for tomorrow TDaP:  2013 Labs: done monthly   reports that she has been smoking Cigarettes.  She has a 28 pack-year smoking history. She has never used smokeless tobacco. She reports that she drinks about 0.5 ounces of alcohol per week. She reports that she does not use illicit drugs.  Past Medical History  Diagnosis Date  . Goiter   . Hypothyroidism   . Asthma     mild  . Hypertension   . Atypical endometrial hyperplasia   . Heart murmur   . Vitamin B12 deficiency   . Yeast infection     for last 3 weeks  . Gout     Past Surgical History  Procedure Laterality Date  . Neck surgery  1994    repair disk  . Tonsillectomy      as a child  . Cholecystectomy  2009    gallstone removed  . Robotic assisted total hysterectomy with bilateral salpingo oopherectomy  11/18/2012    Procedure: ROBOTIC ASSISTED TOTAL HYSTERECTOMY WITH BILATERAL SALPINGO OOPHORECTOMY;  Surgeon: Imagene Gurney A. Alycia Rossetti, MD;  Location: [facility];   Service: Gynecology;  Laterality: N/A;    Current Outpatient Prescriptions  Medication Sig Dispense Refill  . ADVAIR DISKUS 250-50 MCG/DOSE AEPB Once in the am & once at pm      . albuterol (PROVENTIL HFA;VENTOLIN HFA) 108 (90 BASE) MCG/ACT inhaler Inhale 2 puffs into the lungs every 6 (six) hours as needed. Wheezing and shortness of breath      . ergocalciferol (VITAMIN D2) 50000 UNITS capsule Take 50,000 Units by mouth once a week.      . Febuxostat (ULORIC) 80 MG TABS Take 80 mg by mouth daily before breakfast.       . levothyroxine (SYNTHROID, LEVOTHROID) 200 MCG tablet Take 200 mcg by mouth daily before breakfast.       . nystatin cream (MYCOSTATIN) Apply topically 2 (two) times daily. For yeast infection      . triamterene-hydrochlorothiazide (MAXZIDE) 75-50 MG per tablet Take 1 tablet by mouth daily before breakfast.        No current facility-administered medications for this visit.    Family History  Problem Relation Age of Onset  . COPD Mother   . Diabetes Mother   . Congestive Heart Failure Mother   . Diabetes Father   . Heart attack Father     ROS:  Pertinent items are noted in HPI.  Otherwise, a comprehensive ROS was negative.  Exam:   BP 110/70  Pulse 72  Resp 16  Ht 5' 6.25" (1.683  m)  Wt 305 lb (138.347 kg)  BMI 48.84 kg/m2  LMP 11/18/2012 Height: 5' 6.25" (168.3 cm)  Ht Readings from Last 3 Encounters:  01/07/14 5' 6.25" (1.683 m)  03/05/13 5\' 6"  (1.676 m)  11/18/12 5\' 6"  (1.676 m)    General appearance: alert, cooperative and appears stated age Head: Normocephalic, without obvious abnormality, atraumatic Neck: no adenopathy, supple, symmetrical, trachea midline and thyroid non-palpable Lungs: clear to auscultation bilaterally Breasts: normal appearance, no masses or tenderness, No nipple retraction or dimpling, No nipple discharge or bleeding, No axillary or supraclavicular adenopathy Heart: regular rate and rhythm Abdomen: soft, non-tender; no masses,   no organomegaly Extremities: extremities normal, atraumatic, no cyanosis or edema Skin: Skin color, texture, turgor normal. No rashes or lesions Lymph nodes: Cervical, supraclavicular, and axillary nodes normal. No abnormal inguinal nodes palpated Neurologic: Grossly normal   Pelvic: External genitalia:  no lesions              Urethra:  normal appearing urethra with no masses, tenderness or lesions              Bartholin's and Skene's: normal                 Vagina: normal appearing vagina with normal color and discharge, no lesions              Cervix: absent              Pap taken: yes Bimanual Exam:  Uterus:  uterus absent              Adnexa: no mass, fullness, tenderness adnexa absent, exam limited by body habitus               Rectovaginal: Confirms               Anus:  normal sphincter tone, no lesions  A:  Well Woman with normal exam  Menopausal no HRT S/P Robotic hysterectomy for endometrial hyperplasia and atypical hyperplasia per chart with bilateral oophorectomy  Morbid Obesity, not interested in surgical intervention  Hypertension/hypothyroid/asthma stable on medication with PCP management  Needs mammogram, not done since 2007  Colonoscopy due, patient to schedule with PCP  P:   Reviewed health and wellness pertinent to exam  Continue follow up as indicated  Encouraged dietary changes and exercise  Pap smear as per guidelines   Mammogram yearly stressed pap smear taken today with reflex  counseled on breast self exam, mammography screening, adequate intake of calcium and vitamin D, diet and exercise, Kegel's exercises  return annually or prn  An After Visit Summary was printed and given to the patient.

## 2014-01-07 NOTE — Patient Instructions (Signed)

## 2014-01-08 LAB — IPS PAP TEST WITH REFLEX TO HPV

## 2014-01-08 NOTE — Progress Notes (Signed)
Reviewed personally.  M. Suzanne Kiwan Gadsden, MD.  

## 2014-01-11 ENCOUNTER — Telehealth: Payer: Self-pay

## 2014-01-11 NOTE — Telephone Encounter (Signed)
Message copied by Susy Manor on Mon Jan 11, 2014 10:32 AM ------      Message from: Regina Eck      Created: Sat Jan 09, 2014 11:13 AM       Notify patient pap smear negative      08 ------

## 2014-01-11 NOTE — Telephone Encounter (Signed)
[email or web address]

## 2014-01-20 NOTE — Telephone Encounter (Signed)
Patient notified of results.

## 2014-01-20 NOTE — Telephone Encounter (Signed)
Returning a call to Joy. °

## 2014-10-25 ENCOUNTER — Encounter: Payer: Self-pay | Admitting: Certified Nurse Midwife

## 2015-01-17 ENCOUNTER — Ambulatory Visit: Payer: BC Managed Care – PPO | Admitting: Certified Nurse Midwife

## 2015-01-17 ENCOUNTER — Encounter: Payer: Self-pay | Admitting: Certified Nurse Midwife

## 2015-09-06 DIAGNOSIS — G4734 Idiopathic sleep related nonobstructive alveolar hypoventilation: Secondary | ICD-10-CM | POA: Insufficient documentation

## 2015-09-06 DIAGNOSIS — N289 Disorder of kidney and ureter, unspecified: Secondary | ICD-10-CM | POA: Insufficient documentation

## 2015-12-22 DIAGNOSIS — E890 Postprocedural hypothyroidism: Secondary | ICD-10-CM | POA: Insufficient documentation

## 2016-03-13 DIAGNOSIS — A419 Sepsis, unspecified organism: Secondary | ICD-10-CM | POA: Insufficient documentation

## 2016-03-13 DIAGNOSIS — K631 Perforation of intestine (nontraumatic): Secondary | ICD-10-CM | POA: Insufficient documentation

## 2016-03-13 DIAGNOSIS — D649 Anemia, unspecified: Secondary | ICD-10-CM | POA: Insufficient documentation

## 2016-03-28 ENCOUNTER — Emergency Department (HOSPITAL_COMMUNITY): Payer: BLUE CROSS/BLUE SHIELD | Admitting: Anesthesiology

## 2016-03-28 ENCOUNTER — Emergency Department (HOSPITAL_COMMUNITY): Payer: BLUE CROSS/BLUE SHIELD

## 2016-03-28 ENCOUNTER — Encounter (HOSPITAL_COMMUNITY): Admission: EM | Disposition: A | Discharge status: Rehabilitation Facility | Payer: Self-pay | Source: Home / Self Care

## 2016-03-28 ENCOUNTER — Inpatient Hospital Stay (HOSPITAL_COMMUNITY)
Admission: EM | Admit: 2016-03-28 | Discharge: 2016-05-02 | DRG: 003 | Disposition: A | Discharge status: Rehabilitation Facility | Payer: BLUE CROSS/BLUE SHIELD | Attending: General Surgery | Admitting: General Surgery

## 2016-03-28 ENCOUNTER — Encounter (HOSPITAL_COMMUNITY): Payer: Self-pay | Admitting: Emergency Medicine

## 2016-03-28 DIAGNOSIS — M25562 Pain in left knee: Secondary | ICD-10-CM | POA: Diagnosis not present

## 2016-03-28 DIAGNOSIS — Z9911 Dependence on respirator [ventilator] status: Secondary | ICD-10-CM | POA: Diagnosis not present

## 2016-03-28 DIAGNOSIS — R1313 Dysphagia, pharyngeal phase: Secondary | ICD-10-CM | POA: Diagnosis not present

## 2016-03-28 DIAGNOSIS — R6521 Severe sepsis with septic shock: Secondary | ICD-10-CM | POA: Diagnosis not present

## 2016-03-28 DIAGNOSIS — Z880 Allergy status to penicillin: Secondary | ICD-10-CM

## 2016-03-28 DIAGNOSIS — S31109A Unspecified open wound of abdominal wall, unspecified quadrant without penetration into peritoneal cavity, initial encounter: Secondary | ICD-10-CM | POA: Diagnosis not present

## 2016-03-28 DIAGNOSIS — G9341 Metabolic encephalopathy: Secondary | ICD-10-CM | POA: Diagnosis not present

## 2016-03-28 DIAGNOSIS — J9621 Acute and chronic respiratory failure with hypoxia: Secondary | ICD-10-CM | POA: Insufficient documentation

## 2016-03-28 DIAGNOSIS — G4733 Obstructive sleep apnea (adult) (pediatric): Secondary | ICD-10-CM | POA: Diagnosis present

## 2016-03-28 DIAGNOSIS — D6489 Other specified anemias: Secondary | ICD-10-CM | POA: Diagnosis not present

## 2016-03-28 DIAGNOSIS — T8140XA Infection following a procedure, unspecified, initial encounter: Secondary | ICD-10-CM

## 2016-03-28 DIAGNOSIS — Z9289 Personal history of other medical treatment: Secondary | ICD-10-CM

## 2016-03-28 DIAGNOSIS — I10 Essential (primary) hypertension: Secondary | ICD-10-CM | POA: Diagnosis present

## 2016-03-28 DIAGNOSIS — Z930 Tracheostomy status: Secondary | ICD-10-CM | POA: Diagnosis not present

## 2016-03-28 DIAGNOSIS — N179 Acute kidney failure, unspecified: Secondary | ICD-10-CM | POA: Diagnosis not present

## 2016-03-28 DIAGNOSIS — R1310 Dysphagia, unspecified: Secondary | ICD-10-CM | POA: Insufficient documentation

## 2016-03-28 DIAGNOSIS — Y838 Other surgical procedures as the cause of abnormal reaction of the patient, or of later complication, without mention of misadventure at the time of the procedure: Secondary | ICD-10-CM | POA: Diagnosis present

## 2016-03-28 DIAGNOSIS — Z720 Tobacco use: Secondary | ICD-10-CM | POA: Insufficient documentation

## 2016-03-28 DIAGNOSIS — R1110 Vomiting, unspecified: Secondary | ICD-10-CM

## 2016-03-28 DIAGNOSIS — E039 Hypothyroidism, unspecified: Secondary | ICD-10-CM | POA: Insufficient documentation

## 2016-03-28 DIAGNOSIS — B957 Other staphylococcus as the cause of diseases classified elsewhere: Secondary | ICD-10-CM | POA: Diagnosis not present

## 2016-03-28 DIAGNOSIS — K632 Fistula of intestine: Secondary | ICD-10-CM | POA: Diagnosis present

## 2016-03-28 DIAGNOSIS — M109 Gout, unspecified: Secondary | ICD-10-CM | POA: Diagnosis present

## 2016-03-28 DIAGNOSIS — J9600 Acute respiratory failure, unspecified whether with hypoxia or hypercapnia: Secondary | ICD-10-CM | POA: Diagnosis not present

## 2016-03-28 DIAGNOSIS — Z6841 Body Mass Index (BMI) 40.0 and over, adult: Secondary | ICD-10-CM | POA: Diagnosis not present

## 2016-03-28 DIAGNOSIS — R609 Edema, unspecified: Secondary | ICD-10-CM | POA: Diagnosis not present

## 2016-03-28 DIAGNOSIS — F17210 Nicotine dependence, cigarettes, uncomplicated: Secondary | ICD-10-CM | POA: Diagnosis present

## 2016-03-28 DIAGNOSIS — A419 Sepsis, unspecified organism: Secondary | ICD-10-CM | POA: Diagnosis not present

## 2016-03-28 DIAGNOSIS — K651 Peritoneal abscess: Secondary | ICD-10-CM | POA: Diagnosis present

## 2016-03-28 DIAGNOSIS — L8990 Pressure ulcer of unspecified site, unspecified stage: Secondary | ICD-10-CM | POA: Insufficient documentation

## 2016-03-28 DIAGNOSIS — R001 Bradycardia, unspecified: Secondary | ICD-10-CM | POA: Diagnosis not present

## 2016-03-28 DIAGNOSIS — J449 Chronic obstructive pulmonary disease, unspecified: Secondary | ICD-10-CM | POA: Diagnosis present

## 2016-03-28 DIAGNOSIS — Z978 Presence of other specified devices: Secondary | ICD-10-CM

## 2016-03-28 DIAGNOSIS — E872 Acidosis: Secondary | ICD-10-CM | POA: Diagnosis not present

## 2016-03-28 DIAGNOSIS — N390 Urinary tract infection, site not specified: Secondary | ICD-10-CM | POA: Diagnosis not present

## 2016-03-28 DIAGNOSIS — E876 Hypokalemia: Secondary | ICD-10-CM | POA: Diagnosis not present

## 2016-03-28 DIAGNOSIS — Z825 Family history of asthma and other chronic lower respiratory diseases: Secondary | ICD-10-CM

## 2016-03-28 DIAGNOSIS — Z430 Encounter for attention to tracheostomy: Secondary | ICD-10-CM | POA: Diagnosis not present

## 2016-03-28 DIAGNOSIS — K9402 Colostomy infection: Secondary | ICD-10-CM | POA: Diagnosis present

## 2016-03-28 DIAGNOSIS — Z4659 Encounter for fitting and adjustment of other gastrointestinal appliance and device: Secondary | ICD-10-CM

## 2016-03-28 DIAGNOSIS — E8770 Fluid overload, unspecified: Secondary | ICD-10-CM | POA: Diagnosis not present

## 2016-03-28 DIAGNOSIS — D62 Acute posthemorrhagic anemia: Secondary | ICD-10-CM | POA: Diagnosis not present

## 2016-03-28 DIAGNOSIS — E871 Hypo-osmolality and hyponatremia: Secondary | ICD-10-CM | POA: Diagnosis not present

## 2016-03-28 DIAGNOSIS — S31109D Unspecified open wound of abdominal wall, unspecified quadrant without penetration into peritoneal cavity, subsequent encounter: Secondary | ICD-10-CM | POA: Diagnosis not present

## 2016-03-28 DIAGNOSIS — M25561 Pain in right knee: Secondary | ICD-10-CM | POA: Diagnosis not present

## 2016-03-28 DIAGNOSIS — J9601 Acute respiratory failure with hypoxia: Secondary | ICD-10-CM | POA: Diagnosis not present

## 2016-03-28 DIAGNOSIS — L03311 Cellulitis of abdominal wall: Secondary | ICD-10-CM | POA: Diagnosis present

## 2016-03-28 DIAGNOSIS — J9602 Acute respiratory failure with hypercapnia: Secondary | ICD-10-CM | POA: Diagnosis not present

## 2016-03-28 DIAGNOSIS — R34 Anuria and oliguria: Secondary | ICD-10-CM | POA: Diagnosis not present

## 2016-03-28 DIAGNOSIS — Z79899 Other long term (current) drug therapy: Secondary | ICD-10-CM | POA: Diagnosis not present

## 2016-03-28 DIAGNOSIS — J9690 Respiratory failure, unspecified, unspecified whether with hypoxia or hypercapnia: Secondary | ICD-10-CM

## 2016-03-28 DIAGNOSIS — S31109S Unspecified open wound of abdominal wall, unspecified quadrant without penetration into peritoneal cavity, sequela: Secondary | ICD-10-CM | POA: Diagnosis not present

## 2016-03-28 DIAGNOSIS — R0600 Dyspnea, unspecified: Secondary | ICD-10-CM | POA: Diagnosis not present

## 2016-03-28 HISTORY — DX: Chronic obstructive pulmonary disease, unspecified: J44.9

## 2016-03-28 HISTORY — PX: OMENTECTOMY: SHX5985

## 2016-03-28 HISTORY — PX: COLOSTOMY REVISION: SHX5232

## 2016-03-28 HISTORY — PX: WOUND DEBRIDEMENT: SHX247

## 2016-03-28 HISTORY — PX: LAPAROTOMY: SHX154

## 2016-03-28 LAB — URINALYSIS, ROUTINE W REFLEX MICROSCOPIC
BILIRUBIN URINE: NEGATIVE
GLUCOSE, UA: NEGATIVE mg/dL
Hgb urine dipstick: NEGATIVE
KETONES UR: 15 mg/dL — AB
LEUKOCYTES UA: NEGATIVE
NITRITE: NEGATIVE
PROTEIN: NEGATIVE mg/dL
Specific Gravity, Urine: 1.025 (ref 1.005–1.030)
pH: 7.5 (ref 5.0–8.0)

## 2016-03-28 LAB — I-STAT CG4 LACTIC ACID, ED: LACTIC ACID, VENOUS: 1.63 mmol/L (ref 0.5–2.0)

## 2016-03-28 LAB — COMPREHENSIVE METABOLIC PANEL
ALBUMIN: 2.5 g/dL — AB (ref 3.5–5.0)
ALK PHOS: 50 U/L (ref 38–126)
ALT: 24 U/L (ref 14–54)
AST: 34 U/L (ref 15–41)
Anion gap: 10 (ref 5–15)
BILIRUBIN TOTAL: 0.8 mg/dL (ref 0.3–1.2)
BUN: 7 mg/dL (ref 6–20)
CALCIUM: 8.9 mg/dL (ref 8.9–10.3)
CO2: 29 mmol/L (ref 22–32)
CREATININE: 1.12 mg/dL — AB (ref 0.44–1.00)
Chloride: 101 mmol/L (ref 101–111)
GFR calc Af Amer: 60 mL/min — ABNORMAL LOW (ref >60)
GFR calc non Af Amer: 52 mL/min — ABNORMAL LOW (ref >60)
GLUCOSE: 87 mg/dL (ref 65–99)
Potassium: 3.9 mmol/L (ref 3.5–5.1)
Sodium: 140 mmol/L (ref 135–145)
TOTAL PROTEIN: 6.6 g/dL (ref 6.5–8.1)

## 2016-03-28 LAB — CBC WITH DIFFERENTIAL/PLATELET
BASOS ABS: 0 10*3/uL (ref 0.0–0.1)
BASOS PCT: 0 %
EOS PCT: 1 %
Eosinophils Absolute: 0.1 10*3/uL (ref 0.0–0.7)
HCT: 35.8 % — ABNORMAL LOW (ref 36.0–46.0)
Hemoglobin: 11.2 g/dL — ABNORMAL LOW (ref 12.0–15.0)
Lymphocytes Relative: 15 %
Lymphs Abs: 1.4 10*3/uL (ref 0.7–4.0)
MCH: 29.4 pg (ref 26.0–34.0)
MCHC: 31.3 g/dL (ref 30.0–36.0)
MCV: 94 fL (ref 78.0–100.0)
MONO ABS: 1 10*3/uL (ref 0.1–1.0)
Monocytes Relative: 10 %
Neutro Abs: 7.2 10*3/uL (ref 1.7–7.7)
Neutrophils Relative %: 74 %
PLATELETS: 524 10*3/uL — AB (ref 150–400)
RBC: 3.81 MIL/uL — ABNORMAL LOW (ref 3.87–5.11)
RDW: 15.9 % — AB (ref 11.5–15.5)
WBC: 9.7 10*3/uL (ref 4.0–10.5)

## 2016-03-28 LAB — I-STAT VENOUS BLOOD GAS, ED
ACID-BASE EXCESS: 8 mmol/L — AB (ref 0.0–2.0)
Bicarbonate: 32.5 mEq/L — ABNORMAL HIGH (ref 20.0–24.0)
O2 SAT: 28 %
PO2 VEN: 17 mmHg — AB (ref 31.0–45.0)
TCO2: 34 mmol/L (ref 0–100)
pCO2, Ven: 44.7 mmHg — ABNORMAL LOW (ref 45.0–50.0)
pH, Ven: 7.47 — ABNORMAL HIGH (ref 7.250–7.300)

## 2016-03-28 LAB — BRAIN NATRIURETIC PEPTIDE: B NATRIURETIC PEPTIDE 5: 87.2 pg/mL (ref 0.0–100.0)

## 2016-03-28 LAB — LIPASE, BLOOD: Lipase: 64 U/L — ABNORMAL HIGH (ref 11–51)

## 2016-03-28 SURGERY — REVISION, COLOSTOMY
Anesthesia: General | Site: Abdomen

## 2016-03-28 MED ORDER — MEPERIDINE HCL 25 MG/ML IJ SOLN: 6.2500 mg | INTRAMUSCULAR | Status: DC | PRN

## 2016-03-28 MED ORDER — IOPAMIDOL (ISOVUE-300) INJECTION 61%
INTRAVENOUS | Status: AC
  Administered 2016-03-28: 100 mL
  Filled 2016-03-28: qty 100

## 2016-03-28 MED ORDER — LIDOCAINE HCL (CARDIAC) 20 MG/ML IV SOLN
INTRAVENOUS | Status: AC
  Filled 2016-03-28: qty 5

## 2016-03-28 MED ORDER — CIPROFLOXACIN IN D5W 400 MG/200ML IV SOLN
400.0000 mg | INTRAVENOUS | Status: AC
  Administered 2016-03-28: 400 mg via INTRAVENOUS
  Filled 2016-03-28: qty 200

## 2016-03-28 MED ORDER — HYDROMORPHONE HCL 1 MG/ML IJ SOLN
1.0000 mg | Freq: Once | INTRAMUSCULAR | Status: AC
  Administered 2016-03-28: 1 mg via INTRAVENOUS
  Filled 2016-03-28: qty 1

## 2016-03-28 MED ORDER — FENTANYL CITRATE (PF) 250 MCG/5ML IJ SOLN
INTRAMUSCULAR | Status: AC
  Filled 2016-03-28: qty 5

## 2016-03-28 MED ORDER — ROCURONIUM BROMIDE 50 MG/5ML IV SOLN
INTRAVENOUS | Status: AC
  Filled 2016-03-28: qty 1

## 2016-03-28 MED ORDER — PROPOFOL 10 MG/ML IV BOLUS
INTRAVENOUS | Status: AC
  Filled 2016-03-28: qty 20

## 2016-03-28 MED ORDER — METRONIDAZOLE IN NACL 5-0.79 MG/ML-% IV SOLN
500.0000 mg | INTRAVENOUS | Status: AC
  Administered 2016-03-29: 500 mg via INTRAVENOUS
  Filled 2016-03-28: qty 100

## 2016-03-28 MED ORDER — OXYCODONE HCL 5 MG/5ML PO SOLN: 5.0000 mg | Freq: Once | ORAL | Status: DC | PRN

## 2016-03-28 MED ORDER — OXYCODONE HCL 5 MG PO TABS: 5.0000 mg | ORAL_TABLET | Freq: Once | ORAL | Status: DC | PRN

## 2016-03-28 MED ORDER — SODIUM CHLORIDE 0.9 % IV SOLN: Freq: Once | INTRAVENOUS | Status: DC

## 2016-03-28 MED ORDER — ALBUTEROL (5 MG/ML) CONTINUOUS INHALATION SOLN
10.0000 mg/h | INHALATION_SOLUTION | RESPIRATORY_TRACT | Status: DC
  Administered 2016-03-28: 10 mg/h via RESPIRATORY_TRACT
  Filled 2016-03-28: qty 20

## 2016-03-28 MED ORDER — MIDAZOLAM HCL 2 MG/2ML IJ SOLN
INTRAMUSCULAR | Status: AC
  Filled 2016-03-28: qty 2

## 2016-03-28 MED ORDER — SODIUM CHLORIDE 0.9 % IV SOLN: 1.0000 g | Freq: Once | INTRAVENOUS | Status: DC

## 2016-03-28 MED ORDER — HYDROMORPHONE HCL 1 MG/ML IJ SOLN: 0.2500 mg | INTRAMUSCULAR | Status: DC | PRN

## 2016-03-28 SURGICAL SUPPLY — 71 items
APL SKNCLS STERI-STRIP NONHPOA (GAUZE/BANDAGES/DRESSINGS) ×2
BENZOIN TINCTURE PRP APPL 2/3 (GAUZE/BANDAGES/DRESSINGS) ×1 IMPLANT
BLADE SURG ROTATE 9660 (MISCELLANEOUS) IMPLANT
BRR ADH 5X3 SEPRAFILM 6 SHT (MISCELLANEOUS)
CANISTER SUCTION 2500CC (MISCELLANEOUS) ×3 IMPLANT
CANISTER WOUND CARE 500ML ATS (WOUND CARE) ×1 IMPLANT
CHLORAPREP W/TINT 26ML (MISCELLANEOUS) ×2 IMPLANT
COVER MAYO STAND STRL (DRAPES) ×6 IMPLANT
COVER SURGICAL LIGHT HANDLE (MISCELLANEOUS) ×4 IMPLANT
DRAPE LAPAROSCOPIC ABDOMINAL (DRAPES) ×3 IMPLANT
DRAPE PROXIMA HALF (DRAPES) ×6 IMPLANT
DRAPE UTILITY XL STRL (DRAPES) ×15 IMPLANT
DRAPE WARM FLUID 44X44 (DRAPE) ×3 IMPLANT
DRSG OPSITE POSTOP 4X10 (GAUZE/BANDAGES/DRESSINGS) IMPLANT
DRSG OPSITE POSTOP 4X8 (GAUZE/BANDAGES/DRESSINGS) IMPLANT
DRSG VAC ATS LRG SENSATRAC (GAUZE/BANDAGES/DRESSINGS) ×1 IMPLANT
DRSG VERSA FOAM LRG 10X15 (GAUZE/BANDAGES/DRESSINGS) ×1 IMPLANT
ELECT BLADE 6.5 EXT (BLADE) ×3 IMPLANT
ELECT CAUTERY BLADE 6.4 (BLADE) ×6 IMPLANT
ELECT REM PT RETURN 9FT ADLT (ELECTROSURGICAL) ×3
ELECTRODE REM PT RTRN 9FT ADLT (ELECTROSURGICAL) ×2 IMPLANT
GLOVE BIOGEL PI IND STRL 8 (GLOVE) ×4 IMPLANT
GLOVE BIOGEL PI INDICATOR 8 (GLOVE) ×2
GLOVE ECLIPSE 7.5 STRL STRAW (GLOVE) ×6 IMPLANT
GOWN STRL REUS W/ TWL LRG LVL3 (GOWN DISPOSABLE) ×12 IMPLANT
GOWN STRL REUS W/TWL LRG LVL3 (GOWN DISPOSABLE) ×18
KIT BASIN OR (CUSTOM PROCEDURE TRAY) ×3 IMPLANT
KIT REMOVER STAPLE SKIN (MISCELLANEOUS) ×1 IMPLANT
KIT ROOM TURNOVER OR (KITS) ×3 IMPLANT
LEGGING LITHOTOMY PAIR STRL (DRAPES) IMPLANT
LIGASURE IMPACT 36 18CM CVD LR (INSTRUMENTS) ×1 IMPLANT
NS IRRIG 1000ML POUR BTL (IV SOLUTION) ×6 IMPLANT
PACK GENERAL/GYN (CUSTOM PROCEDURE TRAY) ×3 IMPLANT
PAD ARMBOARD 7.5X6 YLW CONV (MISCELLANEOUS) ×6 IMPLANT
PENCIL BUTTON HOLSTER BLD 10FT (ELECTRODE) ×3 IMPLANT
SEPRAFILM PROCEDURAL PACK 3X5 (MISCELLANEOUS) IMPLANT
SOLUTION BETADINE 4OZ (MISCELLANEOUS) ×2 IMPLANT
SPECIMEN JAR LARGE (MISCELLANEOUS) ×1 IMPLANT
SPECIMEN JAR MEDIUM (MISCELLANEOUS) IMPLANT
SPONGE ABDOMINAL VAC ABTHERA (MISCELLANEOUS) ×1 IMPLANT
SPONGE GAUZE 4X4 12PLY STER LF (GAUZE/BANDAGES/DRESSINGS) ×2 IMPLANT
SPONGE LAP 18X18 X RAY DECT (DISPOSABLE) ×2 IMPLANT
STAPLER GUN LINEAR PROX 60 (STAPLE) ×1 IMPLANT
STAPLER VISISTAT 35W (STAPLE) ×3 IMPLANT
SUCTION POOLE TIP (SUCTIONS) ×3 IMPLANT
SUT NOVA 1 T20/GS 25DT (SUTURE) IMPLANT
SUT NOVA NAB DX-16 0-1 5-0 T12 (SUTURE) IMPLANT
SUT PDS AB 1 TP1 54 (SUTURE) IMPLANT
SUT PDS AB 1 TP1 96 (SUTURE) ×6 IMPLANT
SUT PROLENE 2 0 CT2 30 (SUTURE) ×1 IMPLANT
SUT SILK 2 0 SH (SUTURE) ×3 IMPLANT
SUT SILK 2 0 SH CR/8 (SUTURE) ×3 IMPLANT
SUT SILK 2 0 TIES 10X30 (SUTURE) ×3 IMPLANT
SUT SILK 3 0 SH CR/8 (SUTURE) ×3 IMPLANT
SUT SILK 3 0 TIES 10X30 (SUTURE) ×3 IMPLANT
SUT VIC AB 1 CT1 18XCR BRD 8 (SUTURE) IMPLANT
SUT VIC AB 1 CT1 8-18 (SUTURE)
SUT VIC AB 2-0 CT1 27 (SUTURE)
SUT VIC AB 2-0 CT1 TAPERPNT 27 (SUTURE) IMPLANT
SUT VIC AB 3-0 54X BRD REEL (SUTURE) IMPLANT
SUT VIC AB 3-0 BRD 54 (SUTURE)
SUT VIC AB 3-0 SH 27 (SUTURE)
SUT VIC AB 3-0 SH 27X BRD (SUTURE) IMPLANT
SUT VIC AB 3-0 SH 8-18 (SUTURE) ×3 IMPLANT
SYR BULB IRRIGATION 50ML (SYRINGE) ×3 IMPLANT
TOWEL OR 17X26 10 PK STRL BLUE (TOWEL DISPOSABLE) ×6 IMPLANT
TRAY FOLEY CATH 14FRSI W/METER (CATHETERS) IMPLANT
TRAY FOLEY CATH 16FRSI W/METER (SET/KITS/TRAYS/PACK) ×1 IMPLANT
TUBE CONNECTING 12X1/4 (SUCTIONS) ×3 IMPLANT
WATER STERILE IRR 1000ML POUR (IV SOLUTION) ×3 IMPLANT
YANKAUER SUCT BULB TIP NO VENT (SUCTIONS) IMPLANT

## 2016-03-28 NOTE — ED Provider Notes (Signed)
Patient seen/examined in the Emergency Department in conjunction with Resident Physician Provider Irick Patient presents with increased abdominal pain and leaking from abdominal wound.  Pt with recent exp. Laparotomy and colostomy placement in [place].  She reports starting yesterday she had onset of pain and leaking from wound Exam : awake/alert, diffuse abdominal tenderness, wound is noticeably leaking She is also wheezing but in no distress tolerating albuterol Plan: will need CT imaging and surgical consult    Ripley Fraise, MD 03/28/16 1731

## 2016-03-28 NOTE — Anesthesia Preprocedure Evaluation (Addendum)
Anesthesia Evaluation  Patient identified by MRN, date of birth, ID band Patient awake    Reviewed: Allergy & Precautions, NPO status   Airway Mallampati: I  TM Distance: >3 FB Neck ROM: Full    Dental  (+) Dental Advisory Given, Teeth Intact, Lower Dentures Patient described two lower molars as 'shattered':   Pulmonary asthma , COPD,  COPD inhaler, Current Smoker,    breath sounds clear to auscultation       Cardiovascular hypertension, Pt. on medications + Valvular Problems/Murmurs  Rhythm:Regular Rate:Normal     Neuro/Psych    GI/Hepatic   Endo/Other  Hypothyroidism Morbid obesity  Renal/GU      Musculoskeletal   Abdominal   Peds  Hematology   Anesthesia Other Findings   Reproductive/Obstetrics                            Anesthesia Physical Anesthesia Plan  ASA: III and emergent  Anesthesia Plan: General   Post-op Pain Management:    Induction: Intravenous, Rapid sequence and Cricoid pressure planned  Airway Management Planned: Oral ETT  Additional Equipment:   Intra-op Plan:   Post-operative Plan: Extubation in OR  Informed Consent: I have reviewed the patients History and Physical, chart, labs and discussed the procedure including the risks, benefits and alternatives for the proposed anesthesia with the patient or authorized representative who has indicated his/her understanding and acceptance.   Dental advisory given  Plan Discussed with: CRNA, Anesthesiologist and Surgeon  Anesthesia Plan Comments:        Anesthesia Quick Evaluation

## 2016-03-28 NOTE — ED Notes (Signed)
Per [place], pt from her doctors office, lives at home. Pt has colostmy bag that's leaking, leaking out of belly button. Low grade fever. Pt had bowel surgery a few weeks ago. Incision on belly is approximated but greenish drainage. Pt SOB. Hx of COPD. C/o cough.

## 2016-03-28 NOTE — ED Notes (Signed)
Dr. Irick at bedside   

## 2016-03-28 NOTE — H&P (Signed)
Christina Castaneda is an 60 y.o. female.   Chief Complaint: Stool draining from midline wound and abdominal pain. HPI: Patient is about two weeks out from an emergency colectomy and col[place] for perforated diverticulitis.  She was discharge Sunday to come home to the [place] area, went to have staples from her midline wound removed, when feculent discharge was noted from her midline wound.  She was sent to the ED for evaluation.  Past Medical History  Diagnosis Date  . Goiter   . Hypothyroidism   . Asthma     mild  . Hypertension   . Atypical endometrial hyperplasia   . Heart murmur   . Vitamin B12 deficiency   . Yeast infection     for last 3 weeks  . Gout   . COPD (chronic obstructive pulmonary disease) [facility])     Past Surgical History  Procedure Laterality Date  . Neck surgery  1994    repair disk  . Tonsillectomy      as a child  . Cholecystectomy  2009    gallstone removed  . Robotic assisted total hysterectomy with bilateral salpingo oopherectomy  11/18/2012    Procedure: ROBOTIC ASSISTED TOTAL HYSTERECTOMY WITH BILATERAL SALPINGO OOPHORECTOMY;  Surgeon: Imagene Gurney A. Alycia Rossetti, MD;  Location: [facility] ORS;  Service: Gynecology;  Laterality: N/A;    Family History  Problem Relation Age of Onset  . COPD Mother   . Diabetes Mother   . Congestive Heart Failure Mother   . Diabetes Father   . Heart attack Father    Social History:  reports that she has been smoking Cigarettes.  She has a 28 pack-year smoking history. She has never used smokeless tobacco. She reports that she drinks about 0.5 - 1.5 oz of alcohol per week. She reports that she does not use illicit drugs.  Allergies:  Allergies  Allergen Reactions  . Penicillins Hives, Shortness Of Breath and Swelling     (Not in a hospital admission)  Results for orders placed or performed during the hospital encounter of 03/28/16 (from the past 48 hour(s))  Comprehensive metabolic panel     Status: Abnormal   Collection  Time: 03/28/16  5:05 PM  Result Value Ref Range   Sodium 140 135 - 145 mmol/L   Potassium 3.9 3.5 - 5.1 mmol/L   Chloride 101 101 - 111 mmol/L   CO2 29 22 - 32 mmol/L   Glucose, Bld 87 65 - 99 mg/dL   BUN 7 6 - 20 mg/dL   Creatinine, Ser 1.12 (H) 0.44 - 1.00 mg/dL   Calcium 8.9 8.9 - 10.3 mg/dL   Total Protein 6.6 6.5 - 8.1 g/dL   Albumin 2.5 (L) 3.5 - 5.0 g/dL   AST 34 15 - 41 U/L   ALT 24 14 - 54 U/L   Alkaline Phosphatase 50 38 - 126 U/L   Total Bilirubin 0.8 0.3 - 1.2 mg/dL   GFR calc non Af Amer 52 (L) >60 mL/min   GFR calc Af Amer 60 (L) >60 mL/min    Comment: (NOTE) The eGFR has been calculated using the CKD EPI equation. This calculation has not been validated in all clinical situations. eGFR's persistently <60 mL/min signify possible Chronic Kidney Disease.    Anion gap 10 5 - 15  CBC WITH DIFFERENTIAL     Status: Abnormal   Collection Time: 03/28/16  5:05 PM  Result Value Ref Range   WBC 9.7 4.0 - 10.5 K/uL  RBC 3.81 (L) 3.87 - 5.11 MIL/uL   Hemoglobin 11.2 (L) 12.0 - 15.0 g/dL   HCT 35.8 (L) 36.0 - 46.0 %   MCV 94.0 78.0 - 100.0 fL   MCH 29.4 26.0 - 34.0 pg   MCHC 31.3 30.0 - 36.0 g/dL   RDW 15.9 (H) 11.5 - 15.5 %   Platelets 524 (H) 150 - 400 K/uL   Neutrophils Relative % 74 %   Neutro Abs 7.2 1.7 - 7.7 K/uL   Lymphocytes Relative 15 %   Lymphs Abs 1.4 0.7 - 4.0 K/uL   Monocytes Relative 10 %   Monocytes Absolute 1.0 0.1 - 1.0 K/uL   Eosinophils Relative 1 %   Eosinophils Absolute 0.1 0.0 - 0.7 K/uL   Basophils Relative 0 %   Basophils Absolute 0.0 0.0 - 0.1 K/uL  Lipase, blood     Status: Abnormal   Collection Time: 03/28/16  5:05 PM  Result Value Ref Range   Lipase 64 (H) 11 - 51 U/L  Brain natriuretic peptide     Status: None   Collection Time: 03/28/16  5:05 PM  Result Value Ref Range   B Natriuretic Peptide 87.2 0.0 - 100.0 pg/mL  I-Stat venous blood gas, ED ([facility], [facility])     Status: Abnormal   Collection Time: 03/28/16  5:17 PM  Result Value Ref  Range   pH, Ven 7.470 (H) 7.250 - 7.300   pCO2, Ven 44.7 (L) 45.0 - 50.0 mmHg   pO2, Ven 17.0 (L) 31.0 - 45.0 mmHg   Bicarbonate 32.5 (H) 20.0 - 24.0 mEq/L   TCO2 34 0 - 100 mmol/L   O2 Saturation 28.0 %   Acid-Base Excess 8.0 (H) 0.0 - 2.0 mmol/L   Patient temperature HIDE    Sample type VENOUS    Comment NOTIFIED PHYSICIAN   I-Stat CG4 Lactic Acid, ED  (not at  [facility])     Status: None   Collection Time: 03/28/16  5:22 PM  Result Value Ref Range   Lactic Acid, Venous 1.63 0.5 - 2.0 mmol/L  Urinalysis, Routine w reflex microscopic (not at [facility])     Status: Abnormal   Collection Time: 03/28/16  9:42 PM  Result Value Ref Range   Color, Urine YELLOW YELLOW   APPearance CLOUDY (A) CLEAR   Specific Gravity, Urine 1.025 1.005 - 1.030   pH 7.5 5.0 - 8.0   Glucose, UA NEGATIVE NEGATIVE mg/dL   Hgb urine dipstick NEGATIVE NEGATIVE   Bilirubin Urine NEGATIVE NEGATIVE   Ketones, ur 15 (A) NEGATIVE mg/dL   Protein, ur NEGATIVE NEGATIVE mg/dL   Nitrite NEGATIVE NEGATIVE   Leukocytes, UA NEGATIVE NEGATIVE    Comment: MICROSCOPIC NOT DONE ON URINES WITH NEGATIVE PROTEIN, BLOOD, LEUKOCYTES, NITRITE, OR GLUCOSE <1000 mg/dL.   Ct Abdomen Pelvis W Contrast  03/28/2016  CLINICAL DATA:  Abdominal pain status post hemicolectomy for perforated diverticulitis 2 weeks prior 03/12/2016. Patient reports pain before and after surgery, progressive pain since hospital discharge. EXAM: CT ABDOMEN AND PELVIS WITH CONTRAST TECHNIQUE: Multidetector CT imaging of the abdomen and pelvis was performed using the standard protocol following bolus administration of intravenous contrast. CONTRAST:  100 mL ISOVUE-300 IOPAMIDOL (ISOVUE-300) INJECTION 61% COMPARISON:  None available.  Preoperative CT not available. FINDINGS: Lower chest: Opacity in the medial posterior right lung base with small right pleural effusion. Trace dependent atelectasis in the left lower lobe. There mitral annulus calcifications. Liver: Prominent size  measuring 23 cm cranial caudal. Probable steatosis. No focal  lesion. Hepatobiliary: Clips in the gallbladder fossa postcholecystectomy. No biliary dilatation. Pancreas: No ductal dilatation or inflammation. Spleen: Normal. Adrenal glands: No nodule. Kidneys: Symmetric renal enhancement. No hydronephrosis. No perinephric stranding. Stomach/Bowel: Stomach is decompressed. Small hiatal hernia. There are no dilated or thickened small bowel loops. No small bowel obstruction. Left lower quadrant colostomy, contrast opacifies the entire colon. No large bowel obstruction, contrast exits the colostomy and is present in the ostomy bag. Contrast opacifies the distal sigmoid colon and rectum with unremarkable appearance of the Hartmann's pouch. No extraluminal contrast to suggest bowel perforation. Vascular/Lymphatic: No retroperitoneal adenopathy. Abdominal aorta is normal in caliber. Atherosclerosis of the abdominal aorta without aneurysm. Reproductive: Uterus is surgically absent.  No adnexal mass. Bladder: Physiologically distended. Intravesicular air is likely iatrogenic. Other: No intra-abdominal abscess or free air. Minimal pelvic stranding and trace free fluid is likely postoperative. Postsurgical change in the anterior abdominal wall with fat containing lower ventral abdominal wall hernia. Midline skin staples with ill-defined air in fluid in the subcutaneous tissues, without rim enhancing or loculated collection. Left flank edema. Musculoskeletal: There are no acute or suspicious osseous abnormalities. Multilevel degenerative change in the spine. IMPRESSION: 1. Recent postsurgical change with left lower quadrant colostomy. No bowel obstruction or intra-abdominal abscess. 2. Midline abdominal skin staples remain in place. Air with ill-defined fluid in the subjacent subcutaneous tissues, without rim enhancing or drainable collection. This may reflect normal postsurgical findings, superimposed infection is difficult to  exclude based on imaging findings alone. There is a fat containing lower ventral abdominal wall hernia. 3. Medial right lung base opacity, favor atelectasis, less likely pneumonia. Trace right pleural effusion. 4. Additional chronic findings as described. Electronically Signed   By: Jeb Levering M.D.   On: 03/28/2016 21:18   Dg Chest Port 1 View  03/28/2016  CLINICAL DATA:  Shortness of breath and cough.  COPD.  Fever. EXAM: PORTABLE CHEST 1 VIEW COMPARISON:  11/14/2012 FINDINGS: The heart size and mediastinal contours are within normal limits. Both lungs are clear. The visualized skeletal structures are unremarkable. IMPRESSION: Normal chest. Electronically Signed   By: Lorriane Shire M.D.   On: 03/28/2016 17:43    Review of Systems  Constitutional: Positive for fever and chills.  Gastrointestinal: Positive for abdominal pain.       Stool drainage from midline wound    Blood pressure 144/66, pulse 88, temperature 99.5 F (37.5 C), temperature source Oral, resp. rate 25, last menstrual period 11/18/2012, SpO2 96 %. Physical Exam  Constitutional: She is oriented to person, place, and time.  Morbidly obese  HENT:  Head: Normocephalic and atraumatic.  Eyes: Conjunctivae and EOM are normal. Pupils are equal, round, and reactive to light.  Neck: Normal range of motion. Neck supple.  Cardiovascular: Normal rate and normal heart sounds.   No murmur heard. Respiratory: Effort normal and breath sounds normal.  GI: Bowel sounds are decreased. There is generalized tenderness.    Musculoskeletal: Normal range of motion.  Neurological: She is alert and oriented to person, place, and time. She has normal reflexes.  Skin: Skin is warm and dry.  Psychiatric: She has a normal mood and affect. Her behavior is normal. Thought content normal.     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  IV antibiotics OR for revision, possible  diversion.  Judeth Horn, MD 03/28/2016, 11:07 PM

## 2016-03-28 NOTE — ED Provider Notes (Signed)
CSN: [id number]     Arrival date & time 03/28/16  1633 History   First MD Initiated Contact with Patient 03/28/16 1635     Chief Complaint  Patient presents with  . GI Problem      HPI  60 y.o. female presenting with postoperative complication. 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 She went to a nearby hospital and was diagnosed with perforated diverticulitis. She underwent an exploratory laparotomy with sigmoid colon resection on March 20. She was discharged from the hospital there after a week and just returned home to [place] 5 days ago. Yesterday she reports developing leakage of fecal matter from her midline surgical incision site. She continues to have normal ostomy output from her left lower quadrant stoma. Over the last 24 hours she has also experienced increased generalized abdominal pain. This had low-grade temperatures of 99.9. Denies nausea, vomiting, dysuria, frequency, chest pain. Vision has COPD and reports increasing shortness of breath over the day today as well as a nonproductive cough. Her home inhalers produced minimal improvement.   Past Medical History  Diagnosis Date  . Goiter   . Hypothyroidism   . Asthma     mild  . Hypertension   . Atypical endometrial hyperplasia   . Heart murmur   . Vitamin B12 deficiency   . Yeast infection     for last 3 weeks  . Gout   . COPD (chronic obstructive pulmonary disease) [facility])    Past Surgical History  Procedure Laterality Date  . Neck surgery  1994    repair disk  . Tonsillectomy      as a child  . Cholecystectomy  2009    gallstone removed  . Robotic assisted total hysterectomy with bilateral salpingo oopherectomy  11/18/2012    Procedure: ROBOTIC ASSISTED TOTAL HYSTERECTOMY WITH BILATERAL SALPINGO OOPHORECTOMY;  Surgeon: Imagene Gurney A. Alycia Rossetti, MD;  Location: [facility];  Service: Gynecology;  Laterality: N/A;   Family History  Problem  Relation Age of Onset  . COPD Mother   . Diabetes Mother   . Congestive Heart Failure Mother   . Diabetes Father   . Heart attack Father    Social History  Substance Use Topics  . Smoking status: Current Every Day Smoker -- 1.00 packs/day for 28 years    Types: Cigarettes  . Smokeless tobacco: Never Used  . Alcohol Use: 0.5 - 1.5 oz/week    1-3 Standard drinks or equivalent per week     Comment: "couple beers on the weekend"   OB History    Gravida Para Term Preterm AB TAB SAB Ectopic Multiple Living   1 1 1       1      Review of Systems  Constitutional: Negative for fever, chills, activity change and appetite change.  HENT: Negative for congestion, rhinorrhea and sore throat.   Eyes: Negative for visual disturbance.  Respiratory: Positive for cough, shortness of breath and wheezing.   Cardiovascular: Positive for chest pain. Negative for palpitations.  Gastrointestinal: Positive for abdominal pain. Negative for nausea, vomiting, blood in stool and abdominal distention.  Genitourinary: Negative for dysuria, frequency and flank pain.  Musculoskeletal: Negative for myalgias, back pain, joint swelling, arthralgias, gait problem, neck pain and neck stiffness.  Skin: Negative for rash.  Neurological: Negative for dizziness, tremors, syncope, facial asymmetry, speech difficulty, weakness, numbness and headaches.  Psychiatric/Behavioral: Negative for behavioral problems, confusion and agitation.  Allergies  Penicillins  Home Medications   Prior to Admission medications   Medication Sig Start Date End Date Taking? Authorizing Provider  ADVAIR DISKUS 250-50 MCG/DOSE AEPB Once in the am & once at pm 01/06/14  Yes Historical Provider, MD  albuterol (PROVENTIL HFA;VENTOLIN HFA) 108 (90 BASE) MCG/ACT inhaler Inhale 2 puffs into the lungs every 6 (six) hours as needed. Wheezing and shortness of breath   Yes Historical Provider, MD  ergocalciferol (VITAMIN D2) 50000 UNITS capsule  Take 50,000 Units by mouth once a week.   Yes Historical Provider, MD  Febuxostat (ULORIC) 80 MG TABS Take 80 mg by mouth daily before breakfast.    Yes Historical Provider, MD  levothyroxine (SYNTHROID, LEVOTHROID) 200 MCG tablet Take 200 mcg by mouth daily before breakfast.    Yes Historical Provider, MD  lisinopril (PRINIVIL,ZESTRIL) 20 MG tablet Take 20 mg by mouth daily.    Historical Provider, MD  nystatin cream (MYCOSTATIN) Apply topically 2 (two) times daily. For yeast infection    Historical Provider, MD   BP 144/66 mmHg  Pulse 88  Temp(Src) 99.5 F (37.5 C) (Oral)  Resp 25  SpO2 96%  LMP 11/18/2012 Physical Exam  Constitutional: She is oriented to person, place, and time. She appears well-developed and well-nourished. No distress.  HENT:  Head: Normocephalic and atraumatic.  Right Ear: External ear normal.  Left Ear: External ear normal.  Nose: Nose normal.  Mouth/Throat: Oropharynx is clear and moist. No oropharyngeal exudate.  Eyes: Conjunctivae and EOM are normal. Pupils are equal, round, and reactive to light. Right eye exhibits no discharge. Left eye exhibits no discharge.  Neck: Normal range of motion. Neck supple.  Cardiovascular: Normal rate, regular rhythm and normal heart sounds.  Exam reveals no gallop and no friction rub.   No murmur heard. Pulmonary/Chest:  Diffuse wheezing with mild conversational dyspnea. No retractions. Mild increased work of breathing. No rales or rhonchi.  Abdominal: Soft. Bowel sounds are normal. She exhibits no distension.  Leakage of liquid fecal contents from midline surgical incision. Incision is well approximated with no surrounding erythema. Left lower quadrant stoma is retracted beneath the skin, with exposure of subcutaneous tissue. Normal appearance of liquid stool in the ostomy bag. Generalized tenderness to palpation of the abdomen without rebound, guarding, peritoneal signs.  Musculoskeletal: Normal range of motion. She exhibits  no edema or tenderness.  Neurological: She is alert and oriented to person, place, and time. She exhibits normal muscle tone.  Skin: Skin is warm and dry. No rash noted. She is not diaphoretic.  Psychiatric: She has a normal mood and affect. Her behavior is normal. Judgment and thought content normal.    ED Course  Procedures (including critical care time) Labs Review Labs Reviewed  COMPREHENSIVE METABOLIC PANEL - Abnormal; Notable for the following:    Creatinine, Ser 1.12 (*)    Albumin 2.5 (*)    GFR calc non Af Amer 52 (*)    GFR calc Af Amer 60 (*)    All other components within normal limits  CBC WITH DIFFERENTIAL/PLATELET - Abnormal; Notable for the following:    RBC 3.81 (*)    Hemoglobin 11.2 (*)    HCT 35.8 (*)    RDW 15.9 (*)    Platelets 524 (*)    All other components within normal limits  URINALYSIS, ROUTINE W REFLEX MICROSCOPIC (NOT AT [facility]) - Abnormal; Notable for the following:    APPearance CLOUDY (*)    Ketones, ur 15 (*)  All other components within normal limits  LIPASE, BLOOD - Abnormal; Notable for the following:    Lipase 64 (*)    All other components within normal limits  I-STAT VENOUS BLOOD GAS, ED - Abnormal; Notable for the following:    pH, Ven 7.470 (*)    pCO2, Ven 44.7 (*)    pO2, Ven 17.0 (*)    Bicarbonate 32.5 (*)    Acid-Base Excess 8.0 (*)    All other components within normal limits  CULTURE, BLOOD (ROUTINE X 2)  CULTURE, BLOOD (ROUTINE X 2)  URINE CULTURE  BRAIN NATRIURETIC PEPTIDE  I-STAT CG4 LACTIC ACID, ED  TYPE AND SCREEN  ABO/RH    Imaging Review Ct Abdomen Pelvis W Contrast  03/28/2016  CLINICAL DATA:  Abdominal pain status post hemicolectomy for perforated diverticulitis 2 weeks prior 03/12/2016. Patient reports pain before and after surgery, progressive pain since hospital discharge. EXAM: CT ABDOMEN AND PELVIS WITH CONTRAST TECHNIQUE: Multidetector CT imaging of the abdomen and pelvis was performed using the standard  protocol following bolus administration of intravenous contrast. CONTRAST:  100 mL ISOVUE-300 IOPAMIDOL (ISOVUE-300) INJECTION 61% COMPARISON:  None available.  Preoperative CT not available. FINDINGS: Lower chest: Opacity in the medial posterior right lung base with small right pleural effusion. Trace dependent atelectasis in the left lower lobe. There mitral annulus calcifications. Liver: Prominent size measuring 23 cm cranial caudal. Probable steatosis. No focal lesion. Hepatobiliary: Clips in the gallbladder fossa postcholecystectomy. No biliary dilatation. Pancreas: No ductal dilatation or inflammation. Spleen: Normal. Adrenal glands: No nodule. Kidneys: Symmetric renal enhancement. No hydronephrosis. No perinephric stranding. Stomach/Bowel: Stomach is decompressed. Small hiatal hernia. There are no dilated or thickened small bowel loops. No small bowel obstruction. Left lower quadrant colostomy, contrast opacifies the entire colon. No large bowel obstruction, contrast exits the colostomy and is present in the ostomy bag. Contrast opacifies the distal sigmoid colon and rectum with unremarkable appearance of the Hartmann's pouch. No extraluminal contrast to suggest bowel perforation. Vascular/Lymphatic: No retroperitoneal adenopathy. Abdominal aorta is normal in caliber. Atherosclerosis of the abdominal aorta without aneurysm. Reproductive: Uterus is surgically absent.  No adnexal mass. Bladder: Physiologically distended. Intravesicular air is likely iatrogenic. Other: No intra-abdominal abscess or free air. Minimal pelvic stranding and trace free fluid is likely postoperative. Postsurgical change in the anterior abdominal wall with fat containing lower ventral abdominal wall hernia. Midline skin staples with ill-defined air in fluid in the subcutaneous tissues, without rim enhancing or loculated collection. Left flank edema. Musculoskeletal: There are no acute or suspicious osseous abnormalities. Multilevel  degenerative change in the spine. IMPRESSION: 1. Recent postsurgical change with left lower quadrant colostomy. No bowel obstruction or intra-abdominal abscess. 2. Midline abdominal skin staples remain in place. Air with ill-defined fluid in the subjacent subcutaneous tissues, without rim enhancing or drainable collection. This may reflect normal postsurgical findings, superimposed infection is difficult to exclude based on imaging findings alone. There is a fat containing lower ventral abdominal wall hernia. 3. Medial right lung base opacity, favor atelectasis, less likely pneumonia. Trace right pleural effusion. 4. Additional chronic findings as described. Electronically Signed   By: Jeb Levering M.D.   On: 03/28/2016 21:18   Dg Chest Port 1 View  03/28/2016  CLINICAL DATA:  Shortness of breath and cough.  COPD.  Fever. EXAM: PORTABLE CHEST 1 VIEW COMPARISON:  11/14/2012 FINDINGS: The heart size and mediastinal contours are within normal limits. Both lungs are clear. The visualized skeletal structures are unremarkable. IMPRESSION: Normal chest. Electronically  Signed   By: Lorriane Shire M.D.   On: 03/28/2016 17:43   I have personally reviewed and evaluated these images and lab results as part of my medical decision-making.   EKG Interpretation   Date/Time:  Wednesday March 28 2016 17:07:38 EDT Ventricular Rate:  68 PR Interval:  159 QRS Duration: 103 QT Interval:  466 QTC Calculation: 496 R Axis:   64 Text Interpretation:  Sinus rhythm RSR' in V1 or V2, right VCD or RVH  Borderline prolonged QT interval artifact noted No significant change  since last tracing Confirmed by [place] ([id number]) on 03/28/2016  5:27:17 PM      MDM   Final diagnoses:  Post-operative infection    Patient is ill-appearing. Stoma is retracted beneath the skin with exposure of subcutaneous tissue. Fecal matter noted to be leaking from midline incision. CT abdomen pelvis with no intra-abdominal abscess  and normal postsurgical changes. Patient is afebrile, normotensive, and does not appear septic at this time. No leukocytosis. Given abnormal physical exam findings, Gen. surgery was consultation and evaluated the patient at bedside. They're concerned for retraction of the patient's stoma, with necrosis of portions of the colon leading to leakage of fecal contents into the subcutaneous tissue. The patient will be brought emergently to the operating room by general surgery for definitive management. Per request of general surgery, ertapenem was given in the ED for preoperative prophylaxis.  Patient was noted to have increased work of breathing with wheezing on arrival. She was given 1 hour of continuous albuterol with marked improvement in her breathing. Chest x-ray without indication of pneumonia. On reassessment lungs are clear to auscultation bilaterally. Patient is stable for transfer to the OR.      Jenifer Algernon Huxley, MD 03/28/16 [place], MD 03/28/16 [phone]

## 2016-03-28 NOTE — ED Notes (Signed)
Pt has front partial dentures on the bottom, pt states they cant come out.

## 2016-03-28 NOTE — ED Notes (Signed)
X-ray at bedside

## 2016-03-28 NOTE — ED Notes (Signed)
Short stay 36. [phone]

## 2016-03-29 ENCOUNTER — Encounter (HOSPITAL_COMMUNITY): Payer: Self-pay | Admitting: General Surgery

## 2016-03-29 ENCOUNTER — Inpatient Hospital Stay (HOSPITAL_COMMUNITY): Payer: BLUE CROSS/BLUE SHIELD

## 2016-03-29 DIAGNOSIS — K632 Fistula of intestine: Secondary | ICD-10-CM | POA: Diagnosis present

## 2016-03-29 DIAGNOSIS — K9402 Colostomy infection: Principal | ICD-10-CM

## 2016-03-29 DIAGNOSIS — G4733 Obstructive sleep apnea (adult) (pediatric): Secondary | ICD-10-CM

## 2016-03-29 DIAGNOSIS — J9601 Acute respiratory failure with hypoxia: Secondary | ICD-10-CM

## 2016-03-29 DIAGNOSIS — Z9911 Dependence on respirator [ventilator] status: Secondary | ICD-10-CM

## 2016-03-29 DIAGNOSIS — E039 Hypothyroidism, unspecified: Secondary | ICD-10-CM

## 2016-03-29 DIAGNOSIS — S31109A Unspecified open wound of abdominal wall, unspecified quadrant without penetration into peritoneal cavity, initial encounter: Secondary | ICD-10-CM

## 2016-03-29 LAB — CBC WITH DIFFERENTIAL/PLATELET
BASOS ABS: 0 10*3/uL (ref 0.0–0.1)
Basophils Relative: 0 %
EOS PCT: 0 %
Eosinophils Absolute: 0 10*3/uL (ref 0.0–0.7)
HEMATOCRIT: 35.1 % — AB (ref 36.0–46.0)
Hemoglobin: 11 g/dL — ABNORMAL LOW (ref 12.0–15.0)
LYMPHS PCT: 6 %
Lymphs Abs: 1 10*3/uL (ref 0.7–4.0)
MCH: 30.1 pg (ref 26.0–34.0)
MCHC: 31.3 g/dL (ref 30.0–36.0)
MCV: 95.9 fL (ref 78.0–100.0)
MONOS PCT: 9 %
Monocytes Absolute: 1.5 10*3/uL — ABNORMAL HIGH (ref 0.1–1.0)
NEUTROS ABS: 14 10*3/uL — AB (ref 1.7–7.7)
Neutrophils Relative %: 85 %
Platelets: 427 10*3/uL — ABNORMAL HIGH (ref 150–400)
RBC: 3.66 MIL/uL — ABNORMAL LOW (ref 3.87–5.11)
RDW: 16.3 % — AB (ref 11.5–15.5)
WBC: 16.5 10*3/uL — ABNORMAL HIGH (ref 4.0–10.5)

## 2016-03-29 LAB — COMPREHENSIVE METABOLIC PANEL
ALT: 18 U/L (ref 14–54)
AST: 25 U/L (ref 15–41)
Albumin: 2.1 g/dL — ABNORMAL LOW (ref 3.5–5.0)
Alkaline Phosphatase: 41 U/L (ref 38–126)
Anion gap: 9 (ref 5–15)
BILIRUBIN TOTAL: 1 mg/dL (ref 0.3–1.2)
BUN: 7 mg/dL (ref 6–20)
CO2: 26 mmol/L (ref 22–32)
CREATININE: 1.35 mg/dL — AB (ref 0.44–1.00)
Calcium: 7.9 mg/dL — ABNORMAL LOW (ref 8.9–10.3)
Chloride: 99 mmol/L — ABNORMAL LOW (ref 101–111)
GFR calc Af Amer: 48 mL/min — ABNORMAL LOW (ref >60)
GFR, EST NON AFRICAN AMERICAN: 41 mL/min — AB (ref >60)
Glucose, Bld: 103 mg/dL — ABNORMAL HIGH (ref 65–99)
POTASSIUM: 4.4 mmol/L (ref 3.5–5.1)
Sodium: 134 mmol/L — ABNORMAL LOW (ref 135–145)
TOTAL PROTEIN: 5.3 g/dL — AB (ref 6.5–8.1)

## 2016-03-29 LAB — POCT I-STAT 3, ART BLOOD GAS (G3+)
Acid-Base Excess: 2 mmol/L (ref 0.0–2.0)
BICARBONATE: 27.4 meq/L — AB (ref 20.0–24.0)
O2 Saturation: 99 %
PCO2 ART: 46.4 mmHg — AB (ref 35.0–45.0)
PO2 ART: 135 mmHg — AB (ref 80.0–100.0)
Patient temperature: 98.69
TCO2: 29 mmol/L (ref 0–100)
pH, Arterial: 7.38 (ref 7.350–7.450)

## 2016-03-29 LAB — GLUCOSE, CAPILLARY
GLUCOSE-CAPILLARY: 58 mg/dL — AB (ref 65–99)
GLUCOSE-CAPILLARY: 79 mg/dL (ref 65–99)
GLUCOSE-CAPILLARY: 81 mg/dL (ref 65–99)
GLUCOSE-CAPILLARY: 73 mg/dL (ref 65–99)
GLUCOSE-CAPILLARY: 89 mg/dL (ref 65–99)
Glucose-Capillary: 69 mg/dL (ref 65–99)
Glucose-Capillary: 132 mg/dL — ABNORMAL HIGH (ref 65–99)

## 2016-03-29 LAB — TYPE AND SCREEN
ABO/RH(D): O POS
ANTIBODY SCREEN: NEGATIVE

## 2016-03-29 LAB — TRIGLYCERIDES: Triglycerides: 155 mg/dL — ABNORMAL HIGH (ref <150)

## 2016-03-29 LAB — PHOSPHORUS: PHOSPHORUS: 3.8 mg/dL (ref 2.5–4.6)

## 2016-03-29 LAB — MAGNESIUM: MAGNESIUM: 1.9 mg/dL (ref 1.7–2.4)

## 2016-03-29 LAB — MRSA PCR SCREENING: MRSA by PCR: NEGATIVE

## 2016-03-29 LAB — ABO/RH: ABO/RH(D): O POS

## 2016-03-29 LAB — LACTIC ACID, PLASMA: LACTIC ACID, VENOUS: 0.9 mmol/L (ref 0.5–2.0)

## 2016-03-29 MED ORDER — GLYCOPYRROLATE 0.2 MG/ML IJ SOLN
INTRAMUSCULAR | Status: AC
  Filled 2016-03-29: qty 4

## 2016-03-29 MED ORDER — ONDANSETRON HCL 4 MG/2ML IJ SOLN
4.0000 mg | Freq: Four times a day (QID) | INTRAMUSCULAR | Status: DC | PRN
  Administered 2016-04-15 – 2016-04-18 (×2): 4 mg via INTRAVENOUS
  Filled 2016-03-29 (×3): qty 2

## 2016-03-29 MED ORDER — SODIUM CHLORIDE 0.9% FLUSH: 10.0000 mL | INTRAVENOUS | Status: DC | PRN

## 2016-03-29 MED ORDER — POTASSIUM CHLORIDE IN NACL 20-0.9 MEQ/L-% IV SOLN
INTRAVENOUS | Status: DC
  Administered 2016-03-29 (×3): via INTRAVENOUS
  Filled 2016-03-29 (×4): qty 1000

## 2016-03-29 MED ORDER — CHLORHEXIDINE GLUCONATE 0.12% ORAL RINSE (MEDLINE KIT)
15.0000 mL | Freq: Once | OROMUCOSAL | Status: AC
  Administered 2016-03-29: 15 mL via OROMUCOSAL

## 2016-03-29 MED ORDER — MIDAZOLAM HCL 2 MG/2ML IJ SOLN
INTRAMUSCULAR | Status: AC
  Filled 2016-03-29: qty 2

## 2016-03-29 MED ORDER — ALBUTEROL SULFATE (2.5 MG/3ML) 0.083% IN NEBU
2.5000 mg | INHALATION_SOLUTION | Freq: Three times a day (TID) | RESPIRATORY_TRACT | Status: DC
  Administered 2016-03-29 – 2016-04-06 (×23): 2.5 mg via RESPIRATORY_TRACT
  Filled 2016-03-29 (×25): qty 3

## 2016-03-29 MED ORDER — PROPOFOL 500 MG/50ML IV EMUL
INTRAVENOUS | Status: DC | PRN
Start: 2016-03-29 — End: 2016-03-29
  Administered 2016-03-29: 25 ug/kg/min via INTRAVENOUS

## 2016-03-29 MED ORDER — CHLORHEXIDINE GLUCONATE 0.12% ORAL RINSE (MEDLINE KIT): 15.0000 mL | Freq: Two times a day (BID) | OROMUCOSAL | Status: DC

## 2016-03-29 MED ORDER — ANTISEPTIC ORAL RINSE SOLUTION (CORINZ)
7.0000 mL | OROMUCOSAL | Status: DC
  Administered 2016-03-29 – 2016-04-20 (×190): 7 mL via OROMUCOSAL

## 2016-03-29 MED ORDER — LACTATED RINGERS IV SOLN
INTRAVENOUS | Status: DC | PRN
  Administered 2016-03-28 – 2016-03-29 (×2): via INTRAVENOUS

## 2016-03-29 MED ORDER — FENTANYL BOLUS VIA INFUSION
50.0000 ug | INTRAVENOUS | Status: DC | PRN
  Administered 2016-03-29 – 2016-04-17 (×33): 50 ug via INTRAVENOUS
  Filled 2016-03-29: qty 50

## 2016-03-29 MED ORDER — ENOXAPARIN SODIUM 40 MG/0.4ML ~~LOC~~ SOLN
40.0000 mg | SUBCUTANEOUS | Status: DC
Start: 2016-03-29 — End: 2016-05-02
  Administered 2016-03-29 – 2016-05-01 (×33): 40 mg via SUBCUTANEOUS
  Filled 2016-03-29 (×33): qty 0.4

## 2016-03-29 MED ORDER — PROPOFOL 1000 MG/100ML IV EMUL
0.0000 ug/kg/min | INTRAVENOUS | Status: DC
  Administered 2016-03-29 (×2): 25 ug/kg/min via INTRAVENOUS
  Administered 2016-03-29: 20 ug/kg/min via INTRAVENOUS
  Administered 2016-03-29: 40 ug/kg/min via INTRAVENOUS
  Administered 2016-03-29: 50 ug/kg/min via INTRAVENOUS
  Administered 2016-03-30 (×2): 20 ug/kg/min via INTRAVENOUS
  Administered 2016-03-30: 25 ug/kg/min via INTRAVENOUS
  Administered 2016-03-31 – 2016-04-01 (×3): 15 ug/kg/min via INTRAVENOUS
  Administered 2016-04-02: 10 ug/kg/min via INTRAVENOUS
  Administered 2016-04-02: 15 ug/kg/min via INTRAVENOUS
  Administered 2016-04-03: 10 ug/kg/min via INTRAVENOUS
  Administered 2016-04-04: 20 ug/kg/min via INTRAVENOUS
  Filled 2016-03-29 (×21): qty 100

## 2016-03-29 MED ORDER — ROCURONIUM BROMIDE 100 MG/10ML IV SOLN
INTRAVENOUS | Status: DC | PRN
  Administered 2016-03-29: 50 mg via INTRAVENOUS

## 2016-03-29 MED ORDER — CIPROFLOXACIN IN D5W 400 MG/200ML IV SOLN
400.0000 mg | Freq: Two times a day (BID) | INTRAVENOUS | Status: DC
  Administered 2016-03-29 – 2016-04-06 (×16): 400 mg via INTRAVENOUS
  Filled 2016-03-29 (×17): qty 200

## 2016-03-29 MED ORDER — PROPOFOL 1000 MG/100ML IV EMUL: 0.0000 ug/kg/min | INTRAVENOUS | Status: DC

## 2016-03-29 MED ORDER — PROPOFOL 10 MG/ML IV BOLUS
INTRAVENOUS | Status: DC | PRN
Start: 2016-03-29 — End: 2016-03-29
  Administered 2016-03-29: 200 mg via INTRAVENOUS

## 2016-03-29 MED ORDER — NOREPINEPHRINE BITARTRATE 1 MG/ML IV SOLN
2.0000 ug/min | INTRAVENOUS | Status: DC
  Administered 2016-03-29: 2 ug/min via INTRAVENOUS
  Administered 2016-03-30: 16 ug/min via INTRAVENOUS
  Filled 2016-03-29 (×3): qty 4

## 2016-03-29 MED ORDER — FENTANYL CITRATE (PF) 250 MCG/5ML IJ SOLN
INTRAMUSCULAR | Status: DC | PRN
  Administered 2016-03-29 (×2): 50 ug via INTRAVENOUS
  Administered 2016-03-29 (×2): 100 ug via INTRAVENOUS
  Administered 2016-03-29: 50 ug via INTRAVENOUS

## 2016-03-29 MED ORDER — POTASSIUM CHLORIDE IN NACL 20-0.9 MEQ/L-% IV SOLN
INTRAVENOUS | Status: DC
  Administered 2016-03-29: 03:00:00 via INTRAVENOUS
  Filled 2016-03-29 (×3): qty 1000

## 2016-03-29 MED ORDER — ONDANSETRON HCL 4 MG/2ML IJ SOLN
INTRAMUSCULAR | Status: AC
  Filled 2016-03-29: qty 2

## 2016-03-29 MED ORDER — METRONIDAZOLE IN NACL 5-0.79 MG/ML-% IV SOLN
500.0000 mg | Freq: Three times a day (TID) | INTRAVENOUS | Status: DC
  Administered 2016-03-29 – 2016-04-06 (×25): 500 mg via INTRAVENOUS
  Filled 2016-03-29 (×26): qty 100

## 2016-03-29 MED ORDER — LEVOTHYROXINE SODIUM 100 MCG IV SOLR
100.0000 ug | Freq: Every day | INTRAVENOUS | Status: DC
  Administered 2016-03-29 – 2016-04-12 (×14): 100 ug via INTRAVENOUS
  Filled 2016-03-29 (×14): qty 5

## 2016-03-29 MED ORDER — ONDANSETRON 4 MG PO TBDP
4.0000 mg | ORAL_TABLET | Freq: Four times a day (QID) | ORAL | Status: DC | PRN
  Filled 2016-03-29: qty 1

## 2016-03-29 MED ORDER — INSULIN ASPART 100 UNIT/ML ~~LOC~~ SOLN
0.0000 [IU] | Freq: Four times a day (QID) | SUBCUTANEOUS | Status: DC
  Administered 2016-03-30 (×2): 3 [IU] via SUBCUTANEOUS
  Administered 2016-03-30 – 2016-03-31 (×2): 4 [IU] via SUBCUTANEOUS
  Administered 2016-03-31 – 2016-04-01 (×4): 3 [IU] via SUBCUTANEOUS
  Administered 2016-04-01: 2 [IU] via SUBCUTANEOUS
  Administered 2016-04-01 – 2016-04-05 (×7): 3 [IU] via SUBCUTANEOUS

## 2016-03-29 MED ORDER — SODIUM CHLORIDE 0.9% FLUSH
10.0000 mL | Freq: Two times a day (BID) | INTRAVENOUS | Status: DC
  Administered 2016-03-29 – 2016-04-19 (×28): 10 mL
  Administered 2016-04-20: 20 mL
  Administered 2016-04-20 – 2016-04-27 (×9): 10 mL
  Administered 2016-04-27: 20 mL
  Administered 2016-04-28: 10 mL
  Administered 2016-04-28 – 2016-04-29 (×2): 20 mL
  Administered 2016-04-30: 10 mL
  Administered 2016-04-30 – 2016-05-02 (×4): 30 mL

## 2016-03-29 MED ORDER — FENTANYL CITRATE (PF) 100 MCG/2ML IJ SOLN
50.0000 ug | Freq: Once | INTRAMUSCULAR | Status: AC
  Administered 2016-03-29: 50 ug via INTRAVENOUS

## 2016-03-29 MED ORDER — MIDAZOLAM HCL 2 MG/2ML IJ SOLN
INTRAMUSCULAR | Status: DC | PRN
  Administered 2016-03-28 – 2016-03-29 (×2): 2 mg via INTRAVENOUS

## 2016-03-29 MED ORDER — TRACE MINERALS CR-CU-MN-SE-ZN 10-1000-500-60 MCG/ML IV SOLN
INTRAVENOUS | Status: AC
  Administered 2016-03-29: 18:00:00 via INTRAVENOUS
  Filled 2016-03-29: qty 840

## 2016-03-29 MED ORDER — PANTOPRAZOLE SODIUM 40 MG IV SOLR
40.0000 mg | Freq: Every day | INTRAVENOUS | Status: DC
  Administered 2016-03-29 – 2016-04-22 (×25): 40 mg via INTRAVENOUS
  Filled 2016-03-29 (×25): qty 40

## 2016-03-29 MED ORDER — SODIUM CHLORIDE 0.9 % IV BOLUS (SEPSIS)
1000.0000 mL | Freq: Once | INTRAVENOUS | Status: AC
  Administered 2016-03-29: 1000 mL via INTRAVENOUS

## 2016-03-29 MED ORDER — NEOSTIGMINE METHYLSULFATE 10 MG/10ML IV SOLN
INTRAVENOUS | Status: AC
Start: 2016-03-29 — End: 2016-03-29
  Filled 2016-03-29: qty 1

## 2016-03-29 MED ORDER — ANTISEPTIC ORAL RINSE SOLUTION (CORINZ)
7.0000 mL | Freq: Four times a day (QID) | OROMUCOSAL | Status: DC
  Administered 2016-03-29: 7 mL via OROMUCOSAL

## 2016-03-29 MED ORDER — FENTANYL CITRATE (PF) 250 MCG/5ML IJ SOLN
INTRAMUSCULAR | Status: AC
  Filled 2016-03-29: qty 5

## 2016-03-29 MED ORDER — LIDOCAINE HCL (CARDIAC) 20 MG/ML IV SOLN
INTRAVENOUS | Status: DC | PRN
  Administered 2016-03-29: 80 mg via INTRATRACHEAL

## 2016-03-29 MED ORDER — 0.9 % SODIUM CHLORIDE (POUR BTL) OPTIME
TOPICAL | Status: DC | PRN
  Administered 2016-03-29: 1000 mL

## 2016-03-29 MED ORDER — CHLORHEXIDINE GLUCONATE 0.12% ORAL RINSE (MEDLINE KIT)
15.0000 mL | Freq: Two times a day (BID) | OROMUCOSAL | Status: DC
  Administered 2016-03-29 – 2016-05-02 (×63): 15 mL via OROMUCOSAL

## 2016-03-29 MED ORDER — SUCCINYLCHOLINE CHLORIDE 20 MG/ML IJ SOLN
INTRAMUSCULAR | Status: DC | PRN
  Administered 2016-03-29: 160 mg via INTRAVENOUS

## 2016-03-29 MED ORDER — ALBUTEROL SULFATE (2.5 MG/3ML) 0.083% IN NEBU
2.5000 mg | INHALATION_SOLUTION | RESPIRATORY_TRACT | Status: DC | PRN
  Administered 2016-03-30 – 2016-04-23 (×9): 2.5 mg via RESPIRATORY_TRACT
  Filled 2016-03-29 (×9): qty 3

## 2016-03-29 MED ORDER — SODIUM CHLORIDE 0.9 % IV SOLN
25.0000 ug/h | INTRAVENOUS | Status: DC
  Administered 2016-03-29: 50 ug/h via INTRAVENOUS
  Administered 2016-03-29: 150 ug/h via INTRAVENOUS
  Administered 2016-03-29: 50 ug/h via INTRAVENOUS
  Administered 2016-03-30: 175 ug/h via INTRAVENOUS
  Administered 2016-03-30: 200 ug/h via INTRAVENOUS
  Administered 2016-04-01: 150 ug/h via INTRAVENOUS
  Administered 2016-04-01: 200 ug/h via INTRAVENOUS
  Administered 2016-04-02 (×2): 250 ug/h via INTRAVENOUS
  Administered 2016-04-03: 200 ug/h via INTRAVENOUS
  Administered 2016-04-04: 10:00:00 via INTRAVENOUS
  Administered 2016-04-04: 225 ug/h via INTRAVENOUS
  Administered 2016-04-04 – 2016-04-09 (×15): 400 ug/h via INTRAVENOUS
  Administered 2016-04-10: 300 ug/h via INTRAVENOUS
  Administered 2016-04-10 (×2): 400 ug/h via INTRAVENOUS
  Administered 2016-04-11: 300 ug/h via INTRAVENOUS
  Administered 2016-04-12: 250 ug/h via INTRAVENOUS
  Administered 2016-04-12: 300 ug/h via INTRAVENOUS
  Administered 2016-04-12 – 2016-04-13 (×3): 250 ug/h via INTRAVENOUS
  Administered 2016-04-14 (×2): 300 ug/h via INTRAVENOUS
  Administered 2016-04-14: 30 ug/h via INTRAVENOUS
  Administered 2016-04-15: 400 ug/h via INTRAVENOUS
  Administered 2016-04-15 (×2): 300 ug/h via INTRAVENOUS
  Administered 2016-04-16: 400 ug/h via INTRAVENOUS
  Administered 2016-04-16: 300 ug/h via INTRAVENOUS
  Administered 2016-04-16 – 2016-04-18 (×5): 250 ug/h via INTRAVENOUS
  Filled 2016-03-29 (×60): qty 50

## 2016-03-29 NOTE — Anesthesia Postprocedure Evaluation (Signed)
Anesthesia Post Note  Patient: Christina Castaneda  Procedure(s) Performed: Procedure(s) (LRB): COLOSTOMY REVISION (N/A) EXPLORATORY LAPAROTOMY (N/A) OMENTECTOMY (N/A) DEBRIDEMENT WOUND (N/A)  Patient location during evaluation: ICU Anesthesia Type: General Level of consciousness: sedated Pain management: pain level controlled Vital Signs Assessment: post-procedure vital signs reviewed and stable Respiratory status: patient on ventilator - see flowsheet for VS and patient remains intubated per anesthesia plan Cardiovascular status: stable Anesthetic complications: no    Last Vitals:  Filed Vitals:   03/29/16 0300 03/29/16 0400  BP: 110/62   Pulse: 74   Temp:  36.8 C  Resp: 16     Last Pain:  Filed Vitals:   03/29/16 0420  PainSc: 8                  Doshia Dalia A

## 2016-03-29 NOTE — Progress Notes (Signed)
Peripherally Inserted Central Catheter/Midline Placement  The IV Nurse has discussed with the patient and/or persons authorized to consent for the patient, the purpose of this procedure and the potential benefits and risks involved with this procedure.  The benefits include less needle sticks, lab draws from the catheter and patient may be discharged home with the catheter.  Risks include, but not limited to, infection, bleeding, blood clot (thrombus formation), and puncture of an artery; nerve damage and irregular heat beat.  Alternatives to this procedure were also discussed.  Consent signed by Husband due to altered mental status.  PICC/Midline Placement Documentation        Haytham Maher, Nicolette Bang 03/29/2016, 5:20 PM

## 2016-03-29 NOTE — Progress Notes (Signed)
eLink Physician-Brief Progress Note Patient Name: Christina Castaneda DOB: 09-09-1956 MRN: [id number]   Date of Service  03/29/2016  HPI/Events of Note  Hypotension - BP = 87/51. CVP = 8.  eICU Interventions  Will bolus with 0.9 NaCl 1 liter IV over 1 hour now.      Intervention Category Major Interventions: Hypotension - evaluation and management  Lysle Dingwall 03/29/2016, 6:43 PM

## 2016-03-29 NOTE — Progress Notes (Signed)
eLink Physician-Brief Progress Note Patient Name: Christina Castaneda DOB: Oct 18, 1956 MRN: [id number]   Date of Service  03/29/2016  HPI/Events of Note  Hypotension - BP = 79/48.   eICU Interventions  Will order Norepinephrine IV infusion. Titrate to MAP >= 65.     Intervention Category Major Interventions: Hypotension - evaluation and management  Sommer,Steven Eugene 03/29/2016, 7:20 PM

## 2016-03-29 NOTE — Anesthesia Procedure Notes (Signed)
Procedure Name: Intubation Date/Time: 03/29/2016 12:05 AM Performed by: Valetta Fuller Pre-anesthesia Checklist: Patient identified, Emergency Drugs available, Suction available and Patient being monitored Patient Re-evaluated:Patient Re-evaluated prior to inductionOxygen Delivery Method: Circle system utilized Preoxygenation: Pre-oxygenation with 100% oxygen Intubation Type: IV induction, Rapid sequence and Cricoid Pressure applied Laryngoscope Size: Miller and 2 Grade View: Grade I Tube type: Oral Tube size: 7.5 mm Airway Equipment and Method: Stylet Placement Confirmation: ETT inserted through vocal cords under direct vision,  positive ETCO2 and breath sounds checked- equal and bilateral Secured at: 23 cm Tube secured with: Tape Dental Injury: Teeth and Oropharynx as per pre-operative assessment

## 2016-03-29 NOTE — Progress Notes (Addendum)
PARENTERAL NUTRITION CONSULT NOTE - INITIAL  Pharmacy Consult for TPN Indication: Intolerance to enteral feeds  Allergies  Allergen Reactions  . Penicillins Hives, Shortness Of Breath and Swelling    Patient Measurements: Weight: (!) 300 lb 7.8 oz (136.3 kg)  IBW: ~59.3kg Adjusted Body Weight: 82.4kg  Vital Signs: Temp: 97.9 F (36.6 C) (04/06 0700) Temp Source: Axillary (04/06 0700) BP: 107/63 mmHg (04/06 0838) Pulse Rate: 75 (04/06 0845) Intake/Output from previous day: 04/05 0701 - 04/06 0700 In: 1771.4 [I.V.:1671.4; IV Piggyback:100] Out: 1015 [Urine:465; Drains:150; Stool:200; Blood:200] Intake/Output from this shift: Total I/O In: 335.4 [I.V.:335.4] Out: 155 [Urine:55; Drains:100]  Labs:  Recent Labs  03/28/16 1705  WBC 9.7  HGB 11.2*  HCT 35.8*  PLT 524*     Recent Labs  03/28/16 1705 03/29/16 0455  NA 140  --   K 3.9  --   CL 101  --   CO2 29  --   GLUCOSE 87  --   BUN 7  --   CREATININE 1.12*  --   CALCIUM 8.9  --   PROT 6.6  --   ALBUMIN 2.5*  --   AST 34  --   ALT 24  --   ALKPHOS 50  --   BILITOT 0.8  --   TRIG  --  155*   CrCl cannot be calculated (Unknown ideal weight.).   No results for input(s): GLUCAP in the last 72 hours.  Medical History: Past Medical History  Diagnosis Date  . Goiter   . Hypothyroidism   . Asthma     mild  . Hypertension   . Atypical endometrial hyperplasia   . Heart murmur   . Vitamin B12 deficiency   . Yeast infection     for last 3 weeks  . Gout   . COPD (chronic obstructive pulmonary disease) (HCC)     Insulin Requirements in the past 24 hours:  Not on SSI  Assessment: 58 yof 2 weeks out from emergency colectomy/colostomy. Discharged 4/2, feculent discharge noted from midline wound when having staples removed. Noted to have necrotic stoma w/ peritonitis, fascial necrosis. Pharmacy consulted to start TPN due to expected prolonged ileus. Patient in the surgical ICU post-op on  4/6.  Surgeries/Procedures: ~2 wks pta: emergency colectomy/colostomy 4/6: colostomy removal and end stapling of stoma, partial colectomy, ex-lap, omentectomy, wound debridement  GI: s/p Hartmann's procedure ~2wks ago, POD#1 here. PPI IV, NGT, bowel rest, likely back to OR 4/7 Endo: No DM hx. CBGs 82-87 on no SSI. hypothyroidism - synthroid Lytes:  K 4.4, coCa~9.3, Mag 1.9, phos 3.8 Renal: SCr 1.12>1.35. IVF Pulm: COPD. Vent post-op - keeping since likely back to OR 4/7. Consulting PCCM Cards: HTN. VSS Hepatobil: LFTs/alk phos/tbili wnl, TG 155 Neuro: sedated on propofol/fent gtts ID: Cipro/Flagyl D#1 for intra-abdominal infxn. AF, wbc wnl. BC/UC pending 4/6 cipro/flagyl>> Best Practices: scds, lovenox ppx, ppi iv  TPN Access: not yet placed TPN start date: 4/6>>  Current Nutrition:  NPO Propofol gtt - received ~220 kcal so far (will provide ~860kcal/24h if continued at same rate)  IVF: NS+20k '@125'   Nutritional Goals:  1648 kCal, 71-83 grams of protein per day  Plan:  -Start Clinimix E 5/15 at 35 ml/h. Start low with no lipids as patient will receive ~860 kcal from propofol daily at current rate. Advance to goal as tolerated. -Will provide 1456 kcal + 42 g protein daily (including kcal from propofol) -Add MVI + trace elements to TPN -Order SSI and  monitor CBGs -Decrease IVF to 85 ml/h when TPN starts at 1800 -F/u dietitian recs -F/u TPN labs -Monitor TG on propofol  Elicia Lamp, PharmD, [facility] Clinical Pharmacist Pager [phone] 03/29/2016 9:26 AM

## 2016-03-29 NOTE — Progress Notes (Signed)
Notified E-link of continued decrease in SBP after 1L bolus

## 2016-03-29 NOTE — Consult Note (Signed)
PULMONARY / CRITICAL CARE MEDICINE   Name: Christina Castaneda MRN: [id number] DOB: [birth year]    ADMISSION DATE:  03/28/2016 CONSULTATION DATE:  4/6  REFERRING MD:  CCS  CHIEF COMPLAINT:  Vent managment  HISTORY OF PRESENT ILLNESS:   MO WF with diverticulitis , surgery 3/20 Hartman's with colostomy at [facility], returns to [facility] with feculent drainage and required exp lap with open wound 4/6. She has a PMH of OSA, COPD, Smoker, MO (300 lbs) and will remain on vent till abd wound stabilized. PCCM asked to manage vent.   PAST MEDICAL HISTORY :  She  has a past medical history of Goiter; Hypothyroidism; Asthma; Hypertension; Atypical endometrial hyperplasia; Heart murmur; Vitamin B12 deficiency; Yeast infection; Gout; and COPD (chronic obstructive pulmonary disease) ([place]).  PAST SURGICAL HISTORY: She  has past surgical history that includes Neck surgery (1994); Tonsillectomy; Cholecystectomy (2009); and Robotic assisted total hysterectomy with bilateral salpingo oophorectomy (11/18/2012).  Allergies  Allergen Reactions  . Penicillins Hives, Shortness Of Breath and Swelling    No current facility-administered medications on file prior to encounter.   Current Outpatient Prescriptions on File Prior to Encounter  Medication Sig  . ADVAIR DISKUS 250-50 MCG/DOSE AEPB Once in the am & once at pm  . albuterol (PROVENTIL HFA;VENTOLIN HFA) 108 (90 BASE) MCG/ACT inhaler Inhale 2 puffs into the lungs every 6 (six) hours as needed. Wheezing and shortness of breath  . ergocalciferol (VITAMIN D2) 50000 UNITS capsule Take 50,000 Units by mouth once a week.  . Febuxostat (ULORIC) 80 MG TABS Take 80 mg by mouth daily before breakfast.   . levothyroxine (SYNTHROID, LEVOTHROID) 200 MCG tablet Take 200 mcg by mouth daily before breakfast.   . nystatin cream (MYCOSTATIN) Apply topically 2 (two) times daily. For yeast infection    FAMILY HISTORY:  Her indicated that her mother is deceased. She indicated that her  father is deceased.   SOCIAL HISTORY: She  reports that she has been smoking Cigarettes.  She has a 28 pack-year smoking history. She has never used smokeless tobacco. She reports that she drinks about 0.5 - 1.5 oz of alcohol per week. She reports that she does not use illicit drugs.  REVIEW OF SYSTEMS:   NA  SUBJECTIVE:  Sedated on vent  VITAL SIGNS: BP 107/63 mmHg  Pulse 75  Temp(Src) 97.9 F (36.6 C) (Axillary)  Resp 18  Wt 300 lb 7.8 oz (136.3 kg)  SpO2 100%  LMP 11/18/2012  HEMODYNAMICS:    VENTILATOR SETTINGS: Vent Mode:  [-] PRVC FiO2 (%):  [50 %-60 %] 50 % Set Rate:  [16 bmp] 16 bmp Vt Set:  [600 mL] 600 mL PEEP:  [5 cmH20] 5 cmH20 Plateau Pressure:  [19 cmH20-25 cmH20] 25 cmH20  INTAKE / OUTPUT: I/O last 3 completed shifts: In: 1771.4 [I.V.:1671.4; IV Piggyback:100] Out: [id number] [Urine:465; Drains:150; Stool:200; Blood:200]  PHYSICAL EXAMINATION: General:  MOWF sedated on vent Neuro:  Currently sedated. Arouse to voice but no follow commands HEENT:  No JVD, ETT-> vent OGT Cardiovascular:  HSR RRR Lungs: Decreased in bases Abdomen:  Open wound as noted Musculoskeletal:  intact Skin: warm  LABS:  BMET  Recent Labs Lab 03/28/16 1705  NA 140  K 3.9  CL 101  CO2 29  BUN 7  CREATININE 1.12*  GLUCOSE 87    Electrolytes  Recent Labs Lab 03/28/16 1705  CALCIUM 8.9    CBC  Recent Labs Lab 03/28/16 1705  WBC 9.7  HGB 11.2*  HCT 35.8*  PLT 524*    Coag's No results for input(s): APTT, INR in the last 168 hours.  Sepsis Markers  Recent Labs Lab 03/28/16 1722  LATICACIDVEN 1.63    ABG  Recent Labs Lab 03/29/16 0320  PHART 7.380  PCO2ART 46.4*  PO2ART 135.0*    Liver Enzymes  Recent Labs Lab 03/28/16 1705  AST 34  ALT 24  ALKPHOS 50  BILITOT 0.8  ALBUMIN 2.5*    Cardiac Enzymes No results for input(s): TROPONINI, PROBNP in the last 168 hours.  Glucose No results for input(s): GLUCAP in the last 168  hours.  Imaging Ct Abdomen Pelvis W Contrast  03/28/2016  CLINICAL DATA:  Abdominal pain status post hemicolectomy for perforated diverticulitis 2 weeks prior 03/12/2016. Patient reports pain before and after surgery, progressive pain since hospital discharge. EXAM: CT ABDOMEN AND PELVIS WITH CONTRAST TECHNIQUE: Multidetector CT imaging of the abdomen and pelvis was performed using the standard protocol following bolus administration of intravenous contrast. CONTRAST:  100 mL ISOVUE-300 IOPAMIDOL (ISOVUE-300) INJECTION 61% COMPARISON:  None available.  Preoperative CT not available. FINDINGS: Lower chest: Opacity in the medial posterior right lung base with small right pleural effusion. Trace dependent atelectasis in the left lower lobe. There mitral annulus calcifications. Liver: Prominent size measuring 23 cm cranial caudal. Probable steatosis. No focal lesion. Hepatobiliary: Clips in the gallbladder fossa postcholecystectomy. No biliary dilatation. Pancreas: No ductal dilatation or inflammation. Spleen: Normal. Adrenal glands: No nodule. Kidneys: Symmetric renal enhancement. No hydronephrosis. No perinephric stranding. Stomach/Bowel: Stomach is decompressed. Small hiatal hernia. There are no dilated or thickened small bowel loops. No small bowel obstruction. Left lower quadrant colostomy, contrast opacifies the entire colon. No large bowel obstruction, contrast exits the colostomy and is present in the ostomy bag. Contrast opacifies the distal sigmoid colon and rectum with unremarkable appearance of the Hartmann's pouch. No extraluminal contrast to suggest bowel perforation. Vascular/Lymphatic: No retroperitoneal adenopathy. Abdominal aorta is normal in caliber. Atherosclerosis of the abdominal aorta without aneurysm. Reproductive: Uterus is surgically absent.  No adnexal mass. Bladder: Physiologically distended. Intravesicular air is likely iatrogenic. Other: No intra-abdominal abscess or free air. Minimal  pelvic stranding and trace free fluid is likely postoperative. Postsurgical change in the anterior abdominal wall with fat containing lower ventral abdominal wall hernia. Midline skin staples with ill-defined air in fluid in the subcutaneous tissues, without rim enhancing or loculated collection. Left flank edema. Musculoskeletal: There are no acute or suspicious osseous abnormalities. Multilevel degenerative change in the spine. IMPRESSION: 1. Recent postsurgical change with left lower quadrant colostomy. No bowel obstruction or intra-abdominal abscess. 2. Midline abdominal skin staples remain in place. Air with ill-defined fluid in the subjacent subcutaneous tissues, without rim enhancing or drainable collection. This may reflect normal postsurgical findings, superimposed infection is difficult to exclude based on imaging findings alone. There is a fat containing lower ventral abdominal wall hernia. 3. Medial right lung base opacity, favor atelectasis, less likely pneumonia. Trace right pleural effusion. 4. Additional chronic findings as described. Electronically Signed   By: Jeb Levering M.D.   On: 03/28/2016 21:18   Dg Chest Port 1 View  03/29/2016  CLINICAL DATA:  Check endotracheal tube placement EXAM: PORTABLE CHEST 1 VIEW COMPARISON:  03/29/2016 FINDINGS: Endotracheal tube is again noted 4.7 cm above the carina. The nasogastric catheter courses into the stomach. Cardiac shadow is mildly enlarged but stable. The lungs are well aerated. IMPRESSION: Tubes and lines as described.  No acute abnormality noted. Electronically Signed   By: Elta Guadeloupe  Lukens M.D.   On: 03/29/2016 08:12   Dg Chest Port 1 View  03/29/2016  CLINICAL DATA:  Endotracheal tube placement. EXAM: PORTABLE CHEST 1 VIEW COMPARISON:  Radiographs yesterday at 1711 hour FINDINGS: Endotracheal tube 3.4 cm from the carina. Enteric tube in place tip below the diaphragm not included in the field of view. Lung volumes are low. Cardiomediastinal  contours are unchanged. Medial right lung base opacity on CT is not well visualized radiographically. No pulmonary edema, large pleural effusion or pneumothorax. IMPRESSION: Endotracheal and enteric tubes in place. Low lung volumes. Medial right lung base opacity on CT not well seen radiographically. Electronically Signed   By: Jeb Levering M.D.   On: 03/29/2016 02:33   Dg Chest Port 1 View  03/28/2016  CLINICAL DATA:  Shortness of breath and cough.  COPD.  Fever. EXAM: PORTABLE CHEST 1 VIEW COMPARISON:  11/14/2012 FINDINGS: The heart size and mediastinal contours are within normal limits. Both lungs are clear. The visualized skeletal structures are unremarkable. IMPRESSION: Normal chest. Electronically Signed   By: Lorriane Shire M.D.   On: 03/28/2016 17:43     STUDIES:    CULTURES: 4/5 uc>> 4/5 bc x 2>>  ANTIBIOTICS: 4/5 cipro>> 4/5 flagyl>>  SIGNIFICANT EVENTS: 3/20 Exp lap appendectomy,removal sigmoid colon, bowel repair in setting diverticulitis. [facility] 4/6 Exp Lap , colostomy removal, partial colectomy,omentectom, wound debridement, open wound with wound vac.  LINES/TUBES: 4/6 et >> 4/6 abd wound vac>>  DISCUSSION: MO(300 lb) female , smoker, asthma , who had exp lap 3/30 at [facility] with colostomy and presented 4/5 feculent drainage and required exp lap with open wound. She will remain on vent and return to OR in 24-48 hrs.   ASSESSMENT / PLAN:  PULMONARY A: VDRF post lap 4/6 Asthma Copd and tobacco abuse OSA P:   No wean till further abd surgery to stabilize wound  BD's May need NIMVS post extubation, likely needs CPAP long term  CARDIOVASCULAR A:  Hx of Htn  P:  Monitor VS Hold antihypertensives for now, restart as indicated  RENAL Lab Results  Component Value Date   CREATININE 1.12* 03/28/2016   CREATININE 1.13* 11/19/2012   CREATININE 1.20* 11/14/2012   A:   Renal insuff P:   Follow creatinine, volume status  GASTROINTESTINAL A:     Post Exp lap  with open wound, in discontinuity 4/6 P:   Back to OR per [facility] plans PPI Consider PICC for TNA  HEMATOLOGIC  Recent Labs  03/28/16 1705  HGB 11.2*  A:   DVT protection P:  Enoxaparin  Follow H/H  INFECTIOUS A:   Open abd wound with feculent drainage P:   cipro + flagyl day 2 of X  ENDOCRINE A:   Hypothyroid    P:   Synthroid IV Check TSH  NEUROLOGIC A:   Sedated on vent P:   RASS goal: -2 Open abd wound therefore heavier sedation till wound stabilized.   FAMILY  - Updates: None at bedside  - Inter-disciplinary family meet or Palliative Care meeting due by: 4/12   Richardson Landry Minor ACNP Maryanna Shape PCCM Pager [phone] till 3 pm If no answer page [phone] 03/29/2016, 9:11 AM   Attending Note:  I have examined patient, reviewed labs, studies and notes. I have discussed the case with S Minor, and I agree with the data and plans as amended above. Patient is post-op urgent exp-lap for peritonitis, infection of and loss of integrity of colostomy stoma. Left in discontinuity, with open  abd. In the ICU sedated and on MV. On my eval she is hemodynamically stable, will arouse to voice but not follow any commands. Wound vac in place. We will plan to continue pain control, sedation and mechanical ventilation pending repeat trips to OR. Suspect she will require long term TPN, will request PICC placement.  Independent critical care time is 40 minutes.   Baltazar Apo, MD, PhD 03/29/2016, 10:16 AM [facility] [phone] or if no answer [phone]

## 2016-03-29 NOTE — Progress Notes (Signed)
eLink Physician-Brief Progress Note Patient Name: Christina Castaneda DOB: [birth year] MRN: [id number]   Date of Service  03/29/2016  HPI/Events of Note  Hypotension - BP = 83/46  eICU Interventions  Will bolus with 0.9 NaCl 1 liter IV over 1 hour now.      Intervention Category Major Interventions: Hypotension - evaluation and management  Sommer,Steven Eugene 03/29/2016, 3:53 PM

## 2016-03-29 NOTE — Progress Notes (Signed)
Notified e-link regarding urine output of 21mL/hr for 3 hours and 10mL for 2hours; bladder scan completed with 48mL in bladder; foley flushed with return of all flush.  Awaiting further orders.

## 2016-03-29 NOTE — Progress Notes (Signed)
Initial Nutrition Assessment  DOCUMENTATION CODES:   Morbid obesity  INTERVENTION:    TPN per pharmacy  NUTRITION DIAGNOSIS:   Inadequate oral intake related to inability to eat as evidenced by NPO status  GOAL:   Provide needs based on ASPEN/SCCM guidelines  MONITOR:   Vent status, Labs, Weight trends, Skin, I & O's  REASON FOR ASSESSMENT:   Consult New TPN/TNA  ASSESSMENT:   60 y.o. Female presented with postoperative complication. 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 She went to a nearby hospital and was diagnosed with perforated diverticulitis. She underwent an exploratory laparotomy with sigmoid colon resection on March 20. She was discharged from the hospital there after a week and just returned home to [place] 5 days ago. Yesterday she reports developing leakage of fecal matter from her midline surgical incision site. She continues to have normal ostomy output from her left lower quadrant stoma. Over the last 24 hours she has also experienced increased generalized abdominal pain.  Patient s/p procedure 4/6: COLOSTOMY REMOVAL AND END STAPLING OF STOMA PARTIAL COLECTOMY EXPLORATORY LAPAROTOMY OMENTECTOMY DEBRIDEMENT WOUND  Patient is currently intubated on ventilator support -- NGT to LIS Temp (24hrs), Avg:98.7 F (37.1 C), Min:97.9 F (36.6 C), Max:99.8 F (37.7 C)   Propofol: 20.4 ml/hr ----> 538 fat kcals   Patient is receiving TPN with Clinimix E 5/15 @ 35 ml/hr.  No lipids at this time.  TPN + Propofol infusion to provide 1130 kcal and 42 grams protein per day.  Meets 75% minimum estimated energy needs and 29% minimum estimated protein needs.  Nutrition focused physical exam completed.  No muscle or subcutaneous fat depletion noticed.  Diet Order:  Diet NPO time specified .TPN (CLINIMIX-E) Adult  Skin:  Wound (see comment) (abdominal wound VAC)  Last BM:   N/A  Height:   Ht Readings from Last 1 Encounters:  03/29/16 5\' 6"  (1.676 m)    Weight:   Wt Readings from Last 1 Encounters:  03/29/16 300 lb 7.8 oz (136.3 kg)    Ideal Body Weight:  59 kg  BMI:  Body mass index is 48.52 kg/(m^2).  Estimated Nutritional Needs:   Kcal:  [id number]  Protein:  145-155 gm  Fluid:  per MD  EDUCATION NEEDS:   No education needs identified at this time  Arthur Holms, RD, LDN Pager #: [phone] After-Hours Pager #: [phone]

## 2016-03-29 NOTE — Transfer of Care (Signed)
Immediate Anesthesia Transfer of Care Note  Patient: Christina Castaneda  Procedure(s) Performed: Procedure(s): COLOSTOMY REVISION (N/A) EXPLORATORY LAPAROTOMY (N/A) OMENTECTOMY (N/A) DEBRIDEMENT WOUND (N/A)  Patient Location: SICU  Anesthesia Type:General  Level of Consciousness: Patient remains intubated per anesthesia plan  Airway & Oxygen Therapy: Patient placed on Ventilator (see vital sign flow sheet for setting)  Post-op Assessment: Report given to RN and Post -op Vital signs reviewed and stable  Post vital signs: Reviewed and stable  Last Vitals:  Filed Vitals:   03/28/16 2230 03/28/16 2245  BP: 138/75 144/66  Pulse: 83 88  Temp:    Resp: 23 25    Complications: No apparent anesthesia complications

## 2016-03-29 NOTE — Progress Notes (Signed)
Patient ID: Christina Castaneda, female   DOB: [birth year], 60 y.o.   MRN: [id number]     [facility]      [address] [place], [place] [address]    Phone: [phone] FAX: [phone]     Subjective: On vent, sedated.  Agitation with abdominal palpation.   VSS.  Afebrile.  abg's okay, no labs this AM.   Objective:  Vital signs:  Filed Vitals:   03/29/16 0400 03/29/16 0500 03/29/16 0600 03/29/16 0700  BP: 111/66 112/52 121/61 126/58  Pulse: 66 66 71 73  Temp: 98.2 F (36.8 C)   97.9 F (36.6 C)  TempSrc: Oral   Axillary  Resp: _0 Weight:      SpO2: 100% 100% 100% 100%       Intake/Output   Yesterday:  04/05 0701 - 04/06 0700 In: 1771.4 [I.V.:1671.4; IV Piggyback:100] Out: 1275 [Urine:465; Drains:150; Stool:200; Blood:200] This shift:    I/O last 3 completed shifts: In: 1771.4 [I.V.:1671.4; IV Piggyback:100] Out: 1700 [Urine:465; Drains:150; Stool:200; Blood:200]    Physical Exam: General: Pt on vent and sedated.  Chest: scattered rhonchi.  CV:  Pulses intact.  Regular rhythm MS: Normal AROM mjr joints.  No obvious deformity Abdomen: no bowel sounds are present.  VAC in place with dark serosanguinous drainage.  llq stoma with a sponge.  Grimacing with exam.  Ext:  SCDs BLE.  No mjr edema.  No cyanosis Skin: No petechiae / purpura   Problem List:   Active Problems:   Infection of colostomy stoma (HCC)   Colocutaneous fistula    Results:   Labs: Results for orders placed or performed during the hospital encounter of 03/28/16 (from the past 48 hour(s))  Comprehensive metabolic panel     Status: Abnormal   Collection Time: 03/28/16  5:05 PM  Result Value Ref Range   Sodium 140 135 - 145 mmol/L   Potassium 3.9 3.5 - 5.1 mmol/L   Chloride 101 101 - 111 mmol/L   CO2 29 22 - 32 mmol/L   Glucose, Bld 87 65 - 99 mg/dL   BUN 7 6 - 20 mg/dL   Creatinine, Ser 1.12 (H) 0.44 - 1.00 mg/dL   Calcium 8.9 8.9  - 10.3 mg/dL   Total Protein 6.6 6.5 - 8.1 g/dL   Albumin 2.5 (L) 3.5 - 5.0 g/dL   AST 34 15 - 41 U/L   ALT 24 14 - 54 U/L   Alkaline Phosphatase 50 38 - 126 U/L   Total Bilirubin 0.8 0.3 - 1.2 mg/dL   GFR calc non Af Amer 52 (L) >60 mL/min   GFR calc Af Amer 60 (L) >60 mL/min    Comment: (NOTE) The eGFR has been calculated using the CKD EPI equation. This calculation has not been validated in all clinical situations. eGFR's persistently <60 mL/min signify possible Chronic Kidney Disease.    Anion gap 10 5 - 15  CBC WITH DIFFERENTIAL     Status: Abnormal   Collection Time: 03/28/16  5:05 PM  Result Value Ref Range   WBC 9.7 4.0 - 10.5 K/uL   RBC 3.81 (L) 3.87 - 5.11 MIL/uL   Hemoglobin 11.2 (L) 12.0 - 15.0 g/dL   HCT 35.8 (L) 36.0 - 46.0 %   MCV 94.0 78.0 - 100.0 fL   MCH 29.4 26.0 - 34.0 pg   MCHC 31.3 30.0 - 36.0 g/dL   RDW 15.9 (H) 11.5 - 15.5 %  Platelets 524 (H) 150 - 400 K/uL   Neutrophils Relative % 74 %   Neutro Abs 7.2 1.7 - 7.7 K/uL   Lymphocytes Relative 15 %   Lymphs Abs 1.4 0.7 - 4.0 K/uL   Monocytes Relative 10 %   Monocytes Absolute 1.0 0.1 - 1.0 K/uL   Eosinophils Relative 1 %   Eosinophils Absolute 0.1 0.0 - 0.7 K/uL   Basophils Relative 0 %   Basophils Absolute 0.0 0.0 - 0.1 K/uL  Lipase, blood     Status: Abnormal   Collection Time: 03/28/16  5:05 PM  Result Value Ref Range   Lipase 64 (H) 11 - 51 U/L  Brain natriuretic peptide     Status: None   Collection Time: 03/28/16  5:05 PM  Result Value Ref Range   B Natriuretic Peptide 87.2 0.0 - 100.0 pg/mL  I-Stat venous blood gas, ED ([facility], [facility])     Status: Abnormal   Collection Time: 03/28/16  5:17 PM  Result Value Ref Range   pH, Ven 7.470 (H) 7.250 - 7.300   pCO2, Ven 44.7 (L) 45.0 - 50.0 mmHg   pO2, Ven 17.0 (L) 31.0 - 45.0 mmHg   Bicarbonate 32.5 (H) 20.0 - 24.0 mEq/L   TCO2 34 0 - 100 mmol/L   O2 Saturation 28.0 %   Acid-Base Excess 8.0 (H) 0.0 - 2.0 mmol/L   Patient temperature HIDE     Sample type VENOUS    Comment NOTIFIED PHYSICIAN   I-Stat CG4 Lactic Acid, ED  (not at  [facility])     Status: None   Collection Time: 03/28/16  5:22 PM  Result Value Ref Range   Lactic Acid, Venous 1.63 0.5 - 2.0 mmol/L  Urinalysis, Routine w reflex microscopic (not at [facility])     Status: Abnormal   Collection Time: 03/28/16  9:42 PM  Result Value Ref Range   Color, Urine YELLOW YELLOW   APPearance CLOUDY (A) CLEAR   Specific Gravity, Urine 1.025 1.005 - 1.030   pH 7.5 5.0 - 8.0   Glucose, UA NEGATIVE NEGATIVE mg/dL   Hgb urine dipstick NEGATIVE NEGATIVE   Bilirubin Urine NEGATIVE NEGATIVE   Ketones, ur 15 (A) NEGATIVE mg/dL   Protein, ur NEGATIVE NEGATIVE mg/dL   Nitrite NEGATIVE NEGATIVE   Leukocytes, UA NEGATIVE NEGATIVE    Comment: MICROSCOPIC NOT DONE ON URINES WITH NEGATIVE PROTEIN, BLOOD, LEUKOCYTES, NITRITE, OR GLUCOSE <1000 mg/dL.  Type and screen [facility]     Status: None   Collection Time: 03/28/16 11:20 PM  Result Value Ref Range   ABO/RH(D) O POS    Antibody Screen NEG    Sample Expiration 03/31/2016   ABO/Rh     Status: None   Collection Time: 03/28/16 11:20 PM  Result Value Ref Range   ABO/RH(D) O POS   MRSA PCR Screening     Status: None   Collection Time: 03/29/16  2:15 AM  Result Value Ref Range   MRSA by PCR NEGATIVE NEGATIVE    Comment:        The GeneXpert MRSA Assay (FDA approved for NASAL specimens only), is one component of a comprehensive MRSA colonization surveillance program. It is not intended to diagnose MRSA infection nor to guide or monitor treatment for MRSA infections.   I-STAT 3, arterial blood gas (G3+)     Status: Abnormal   Collection Time: 03/29/16  3:20 AM  Result Value Ref Range   pH, Arterial 7.380 7.350 - 7.450  pCO2 arterial 46.4 (H) 35.0 - 45.0 mmHg   pO2, Arterial 135.0 (H) 80.0 - 100.0 mmHg   Bicarbonate 27.4 (H) 20.0 - 24.0 mEq/L   TCO2 29 0 - 100 mmol/L   O2 Saturation 99.0 %   Acid-Base Excess 2.0  0.0 - 2.0 mmol/L   Patient temperature 98.69 F    Collection site RADIAL, ALLEN'S TEST ACCEPTABLE    Drawn by RT    Sample type ARTERIAL   Triglycerides     Status: Abnormal   Collection Time: 03/29/16  4:55 AM  Result Value Ref Range   Triglycerides 155 (H) <150 mg/dL    Imaging / Studies: Ct Abdomen Pelvis W Contrast  03/28/2016  CLINICAL DATA:  Abdominal pain status post hemicolectomy for perforated diverticulitis 2 weeks prior 03/12/2016. Patient reports pain before and after surgery, progressive pain since hospital discharge. EXAM: CT ABDOMEN AND PELVIS WITH CONTRAST TECHNIQUE: Multidetector CT imaging of the abdomen and pelvis was performed using the standard protocol following bolus administration of intravenous contrast. CONTRAST:  100 mL ISOVUE-300 IOPAMIDOL (ISOVUE-300) INJECTION 61% COMPARISON:  None available.  Preoperative CT not available. FINDINGS: Lower chest: Opacity in the medial posterior right lung base with small right pleural effusion. Trace dependent atelectasis in the left lower lobe. There mitral annulus calcifications. Liver: Prominent size measuring 23 cm cranial caudal. Probable steatosis. No focal lesion. Hepatobiliary: Clips in the gallbladder fossa postcholecystectomy. No biliary dilatation. Pancreas: No ductal dilatation or inflammation. Spleen: Normal. Adrenal glands: No nodule. Kidneys: Symmetric renal enhancement. No hydronephrosis. No perinephric stranding. Stomach/Bowel: Stomach is decompressed. Small hiatal hernia. There are no dilated or thickened small bowel loops. No small bowel obstruction. Left lower quadrant colostomy, contrast opacifies the entire colon. No large bowel obstruction, contrast exits the colostomy and is present in the ostomy bag. Contrast opacifies the distal sigmoid colon and rectum with unremarkable appearance of the Hartmann's pouch. No extraluminal contrast to suggest bowel perforation. Vascular/Lymphatic: No retroperitoneal adenopathy.  Abdominal aorta is normal in caliber. Atherosclerosis of the abdominal aorta without aneurysm. Reproductive: Uterus is surgically absent.  No adnexal mass. Bladder: Physiologically distended. Intravesicular air is likely iatrogenic. Other: No intra-abdominal abscess or free air. Minimal pelvic stranding and trace free fluid is likely postoperative. Postsurgical change in the anterior abdominal wall with fat containing lower ventral abdominal wall hernia. Midline skin staples with ill-defined air in fluid in the subcutaneous tissues, without rim enhancing or loculated collection. Left flank edema. Musculoskeletal: There are no acute or suspicious osseous abnormalities. Multilevel degenerative change in the spine. IMPRESSION: 1. Recent postsurgical change with left lower quadrant colostomy. No bowel obstruction or intra-abdominal abscess. 2. Midline abdominal skin staples remain in place. Air with ill-defined fluid in the subjacent subcutaneous tissues, without rim enhancing or drainable collection. This may reflect normal postsurgical findings, superimposed infection is difficult to exclude based on imaging findings alone. There is a fat containing lower ventral abdominal wall hernia. 3. Medial right lung base opacity, favor atelectasis, less likely pneumonia. Trace right pleural effusion. 4. Additional chronic findings as described. Electronically Signed   By: Jeb Levering M.D.   On: 03/28/2016 21:18   Dg Chest Port 1 View  03/29/2016  CLINICAL DATA:  Check endotracheal tube placement EXAM: PORTABLE CHEST 1 VIEW COMPARISON:  03/29/2016 FINDINGS: Endotracheal tube is again noted 4.7 cm above the carina. The nasogastric catheter courses into the stomach. Cardiac shadow is mildly enlarged but stable. The lungs are well aerated. IMPRESSION: Tubes and lines as described.  No acute  abnormality noted. Electronically Signed   By: Inez Catalina M.D.   On: 03/29/2016 08:12   Dg Chest Port 1 View  03/29/2016  CLINICAL  DATA:  Endotracheal tube placement. EXAM: PORTABLE CHEST 1 VIEW COMPARISON:  Radiographs yesterday at 1711 hour FINDINGS: Endotracheal tube 3.4 cm from the carina. Enteric tube in place tip below the diaphragm not included in the field of view. Lung volumes are low. Cardiomediastinal contours are unchanged. Medial right lung base opacity on CT is not well visualized radiographically. No pulmonary edema, large pleural effusion or pneumothorax. IMPRESSION: Endotracheal and enteric tubes in place. Low lung volumes. Medial right lung base opacity on CT not well seen radiographically. Electronically Signed   By: Jeb Levering M.D.   On: 03/29/2016 02:33   Dg Chest Port 1 View  03/28/2016  CLINICAL DATA:  Shortness of breath and cough.  COPD.  Fever. EXAM: PORTABLE CHEST 1 VIEW COMPARISON:  11/14/2012 FINDINGS: The heart size and mediastinal contours are within normal limits. Both lungs are clear. The visualized skeletal structures are unremarkable. IMPRESSION: Normal chest. Electronically Signed   By: Lorriane Shire M.D.   On: 03/28/2016 17:43    Medications / Allergies:  Scheduled Meds: . albuterol  2.5 mg Nebulization TID  . antiseptic oral rinse  7 mL Mouth Rinse 10 times per day  . chlorhexidine gluconate (SAGE KIT)  15 mL Mouth Rinse BID  . ciprofloxacin  400 mg Intravenous Q12H  . enoxaparin (LOVENOX) injection  40 mg Subcutaneous Q24H  . metronidazole  500 mg Intravenous Q8H  . pantoprazole (PROTONIX) IV  40 mg Intravenous QHS   Continuous Infusions: . 0.9 % NaCl with KCl 20 mEq / L 125 mL/hr at 03/29/16 0700  . fentaNYL infusion INTRAVENOUS 100 mcg/hr (03/29/16 0700)  . propofol (DIPRIVAN) infusion 40 mcg/kg/min (03/29/16 0700)   PRN Meds:.albuterol, fentaNYL, ondansetron **OR** ondansetron (ZOFRAN) IV  Antibiotics: Anti-infectives    Start     Dose/Rate Route Frequency Ordered Stop   03/29/16 1200  ciprofloxacin (CIPRO) IVPB 400 mg     400 mg 200 mL/hr over 60 Minutes Intravenous  Every 12 hours 03/29/16 0213     03/29/16 0600  [MAR Hold]  ciprofloxacin (CIPRO) IVPB 400 mg     (MAR Hold since 03/28/16 2357)   400 mg 200 mL/hr over 60 Minutes Intravenous On call to O.R. 03/28/16 2306 03/28/16 2355   03/29/16 0600  [MAR Hold]  metroNIDAZOLE (FLAGYL) IVPB 500 mg     (MAR Hold since 03/28/16 2357)   500 mg 100 mL/hr over 60 Minutes Intravenous On call to O.R. 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 Hulen Skains -NGT, bowel rest -likely back to OR tomorrow -keep on vent for OR Respiratory-full vent support, consulted CCM, Dr. Lamonte Sakai to see.  Appreciate assistance  ID-cipro/flagyl VTE prophylaxis-SCD/lovenox Neuro-propofol and fentanyl for sedation FEN-IVF, check labs. PCM-no ostomy presently.  Expected to have a prolonged ileus.  Will therefore insert a PICC and start TPN Hypothyroidism-start IV levo Dispo-ICU, tentative take back to [facility], [facility] Pager [phone](7A-4:30P) For consults and floor pages call [phone](7A-4:30P)  03/29/2016 8:35 AM

## 2016-03-29 NOTE — Op Note (Addendum)
OPERATIVE REPORT  DATE OF OPERATION:  03/29/2016  PATIENT:  Christina Castaneda  60 y.o. female  PRE-OPERATIVE DIAGNOSIS:  Necrostic stoma with peritonitis  POST-OPERATIVE DIAGNOSIS:  Necrostic stoma with peritonitis, fascial necrosis  PROCEDURE:  Procedure(s): COLOSTOMY REMOVAL AND END STAPLING OF STOMA PARTIAL COLECTOMY EXPLORATORY LAPAROTOMY OMENTECTOMY DEBRIDEMENT WOUND  SURGEON:  Surgeon(s): Judeth Horn, MD Hall Busing, MD  ASSISTANT: Rolanda Jay, M.D.  ANESTHESIA:   general  EBL: 200 ml  BLOOD ADMINISTERED: none  DRAINS: Nasogastric Tube, Urinary Catheter (Foley) and Open abdomen wound dressng for open abdomen   SPECIMEN:  Source of Specimen:  Necrotic distal colon at colostomy site  COUNTS CORRECT:  YES  PROCEDURE DETAILS: The patient was taken to the operating room urgently for exploratory lateral parotid me because of a devascularize, necrotic, fistulized left upper quadrant stoma placed approximately 2 weeks ago.  She was placed on the table in supine position. After an adequate general endotracheal anesthetic was administered she was prepped and draped in the usual sterile manner exposing her large midline wound and also the left-sided stoma which was necrotic.  A proper timeout was performed identifying the patient and the procedure to be performed. We started by removing the midline staples through which purulent and feculent material was losing. All the staples are used to clearly see that the subcutaneous fat and tissue down to the fascia had been infected and partially eaten away by this intense infection.  The midline fascia stitches were partially torn through the left side of the abdominal wall fascia. There was a large mass protruding from the lower one third of the incision. In order to safely get into the peritoneal cavity the incision was actually taken proximal to the midline fascia from the previous operation. Was at that site that we were able to get into  some free peritoneal and come down on the fascial attachments laterally which were mostly omentum.  A large piece of omentum was resected from the lower third of the wound using a LigaSure device. It is intensely attached to what appeared to be the preperitoneal fascia in the pelvis and also the rectal stump. Within the omentum there appeared to be somewhat of an internal hernia where there was small bowel loops attached to this large lump of omentum. These were safely separated in the large clump of omentum in the lower part of wound was resected without event.  Once we are able to freely enter into the peritoneal cavity attachment of the stoma to the left side could be seen. It was significantly necrotic and stenotic where the stoma had fallen away from the skin and just above the fascia which was several inches from the surface. The stoma was dissected free of its fascial opening brought back into the peritoneal cavity were Kocher clamp was passed across the distal and then subsequently resected with a TX 60 stapler.  The distal end of the descending colon was marked using a figure-of-eight stitch of 2-0 Prolene. It was slightly mobilize also in order to provide for better formation of the stoma in the future.  The distal rectal stump was identified did not appear to be leaking. There was necrotic fluid in the subcutaneous wound. Cultures were not sent as this was clearly feculent material. We irrigated this out copiously as we did in the peritoneal cavity. No significant drains were placed in the subcutaneous space however the wound was left open with a negative pressure wound dressing.  The stoma site was washed  out with saline solution then a thick rol of white foam placed in the stoma noted to account for the multiple layers and flaps. On top of the stoma site at the skin was a folded piece of blue foam which correlated with the blue foam on the intra-abdominal negative pressure wound dressing  placed.  Prior to closing the abdomen with the negative pressure wound dressing we irrigated the peritoneal cavity copiously with saline solution. All needle counts, sponge counts, and instrument counts were correct. The negative pressure wound dressing was applied. There was excellent leak control. There are no lap tapes (the peritoneal cavity however the transected descending colon was left in discontinuity and not brought out as a stoma as the patient has significant fascial and muscular necrosis on the left side this may need to be mobilized to the right side. This is more or less a damage control procedure with the necrotic and retracted stoma was controlled and the subfascial and the subcutaneous tissue necrotic areas were debrided.  All counts were correct including needles, sponges, and instruments.  PATIENT DISPOSITION:  ICU - intubated and critically ill.   Christina Castaneda 4/6/20171:55 AM

## 2016-03-30 ENCOUNTER — Inpatient Hospital Stay (HOSPITAL_COMMUNITY): Payer: BLUE CROSS/BLUE SHIELD

## 2016-03-30 ENCOUNTER — Inpatient Hospital Stay (HOSPITAL_COMMUNITY): Payer: BLUE CROSS/BLUE SHIELD | Admitting: Anesthesiology

## 2016-03-30 ENCOUNTER — Encounter (HOSPITAL_COMMUNITY): Admission: EM | Disposition: A | Discharge status: Rehabilitation Facility | Payer: Self-pay | Source: Home / Self Care

## 2016-03-30 DIAGNOSIS — Z9911 Dependence on respirator [ventilator] status: Secondary | ICD-10-CM

## 2016-03-30 DIAGNOSIS — S31109S Unspecified open wound of abdominal wall, unspecified quadrant without penetration into peritoneal cavity, sequela: Secondary | ICD-10-CM

## 2016-03-30 DIAGNOSIS — E039 Hypothyroidism, unspecified: Secondary | ICD-10-CM

## 2016-03-30 HISTORY — PX: VACUUM ASSISTED CLOSURE CHANGE: SHX5227

## 2016-03-30 HISTORY — PX: COLOSTOMY: SHX63

## 2016-03-30 HISTORY — PX: COLOSTOMY REVISION: SHX5232

## 2016-03-30 LAB — COMPREHENSIVE METABOLIC PANEL
ALBUMIN: 1.8 g/dL — AB (ref 3.5–5.0)
ALT: 16 U/L (ref 14–54)
AST: 20 U/L (ref 15–41)
Alkaline Phosphatase: 38 U/L (ref 38–126)
Anion gap: 8 (ref 5–15)
BUN: 8 mg/dL (ref 6–20)
CHLORIDE: 102 mmol/L (ref 101–111)
CO2: 23 mmol/L (ref 22–32)
CREATININE: 1.23 mg/dL — AB (ref 0.44–1.00)
Calcium: 7.6 mg/dL — ABNORMAL LOW (ref 8.9–10.3)
GFR calc non Af Amer: 46 mL/min — ABNORMAL LOW (ref >60)
GFR, EST AFRICAN AMERICAN: 53 mL/min — AB (ref >60)
Glucose, Bld: 156 mg/dL — ABNORMAL HIGH (ref 65–99)
Potassium: 4.2 mmol/L (ref 3.5–5.1)
Sodium: 133 mmol/L — ABNORMAL LOW (ref 135–145)
Total Bilirubin: 0.9 mg/dL (ref 0.3–1.2)
Total Protein: 5.1 g/dL — ABNORMAL LOW (ref 6.5–8.1)

## 2016-03-30 LAB — TRIGLYCERIDES: Triglycerides: 75 mg/dL (ref <150)

## 2016-03-30 LAB — DIFFERENTIAL
BASOS ABS: 0 10*3/uL (ref 0.0–0.1)
BASOS PCT: 0 %
EOS ABS: 0.2 10*3/uL (ref 0.0–0.7)
Eosinophils Relative: 1 %
Lymphocytes Relative: 5 %
Lymphs Abs: 0.7 10*3/uL (ref 0.7–4.0)
Monocytes Absolute: 0.8 10*3/uL (ref 0.1–1.0)
Monocytes Relative: 6 %
NEUTROS PCT: 88 %
Neutro Abs: 12.4 10*3/uL — ABNORMAL HIGH (ref 1.7–7.7)

## 2016-03-30 LAB — CBC
HCT: 31.8 % — ABNORMAL LOW (ref 36.0–46.0)
Hemoglobin: 10.1 g/dL — ABNORMAL LOW (ref 12.0–15.0)
MCH: 30.4 pg (ref 26.0–34.0)
MCHC: 31.8 g/dL (ref 30.0–36.0)
MCV: 95.8 fL (ref 78.0–100.0)
Platelets: 433 10*3/uL — ABNORMAL HIGH (ref 150–400)
RBC: 3.32 MIL/uL — ABNORMAL LOW (ref 3.87–5.11)
RDW: 16.3 % — AB (ref 11.5–15.5)
WBC: 14 10*3/uL — AB (ref 4.0–10.5)

## 2016-03-30 LAB — POCT I-STAT 3, ART BLOOD GAS (G3+)
ACID-BASE DEFICIT: 3 mmol/L — AB (ref 0.0–2.0)
BICARBONATE: 22.8 meq/L (ref 20.0–24.0)
O2 Saturation: 98 %
TCO2: 24 mmol/L (ref 0–100)
pCO2 arterial: 41.9 mmHg (ref 35.0–45.0)
pH, Arterial: 7.345 — ABNORMAL LOW (ref 7.350–7.450)
pO2, Arterial: 116 mmHg — ABNORMAL HIGH (ref 80.0–100.0)

## 2016-03-30 LAB — LACTIC ACID, PLASMA: LACTIC ACID, VENOUS: 1 mmol/L (ref 0.5–2.0)

## 2016-03-30 LAB — GLUCOSE, CAPILLARY
GLUCOSE-CAPILLARY: 116 mg/dL — AB (ref 65–99)
GLUCOSE-CAPILLARY: 107 mg/dL — AB (ref 65–99)
Glucose-Capillary: 121 mg/dL — ABNORMAL HIGH (ref 65–99)
Glucose-Capillary: 150 mg/dL — ABNORMAL HIGH (ref 65–99)
Glucose-Capillary: 155 mg/dL — ABNORMAL HIGH (ref 65–99)

## 2016-03-30 LAB — URINE CULTURE

## 2016-03-30 LAB — MAGNESIUM: MAGNESIUM: 1.7 mg/dL (ref 1.7–2.4)

## 2016-03-30 LAB — PHOSPHORUS: PHOSPHORUS: 3.8 mg/dL (ref 2.5–4.6)

## 2016-03-30 LAB — PREALBUMIN: Prealbumin: 8.4 mg/dL — ABNORMAL LOW (ref 18–38)

## 2016-03-30 SURGERY — REPLACEMENT, WOUND VAC DRESSING, ABDOMEN
Anesthesia: General | Site: Abdomen

## 2016-03-30 MED ORDER — MIDAZOLAM HCL 5 MG/5ML IJ SOLN
INTRAMUSCULAR | Status: DC | PRN
  Administered 2016-03-30: 2 mg via INTRAVENOUS

## 2016-03-30 MED ORDER — FENTANYL CITRATE (PF) 100 MCG/2ML IJ SOLN
INTRAMUSCULAR | Status: DC | PRN
  Administered 2016-03-30: 50 ug via INTRAVENOUS

## 2016-03-30 MED ORDER — TRACE MINERALS CR-CU-MN-SE-ZN 10-1000-500-60 MCG/ML IV SOLN
INTRAVENOUS | Status: AC
  Administered 2016-03-30: 18:00:00 via INTRAVENOUS
  Filled 2016-03-30: qty 1320

## 2016-03-30 MED ORDER — ROCURONIUM BROMIDE 100 MG/10ML IV SOLN
INTRAVENOUS | Status: DC | PRN
  Administered 2016-03-30 (×2): 50 mg via INTRAVENOUS

## 2016-03-30 MED ORDER — 0.9 % SODIUM CHLORIDE (POUR BTL) OPTIME
TOPICAL | Status: DC | PRN
  Administered 2016-03-30 (×2): 1000 mL

## 2016-03-30 MED ORDER — MIDAZOLAM HCL 2 MG/2ML IJ SOLN
INTRAMUSCULAR | Status: AC
  Filled 2016-03-30: qty 2

## 2016-03-30 MED ORDER — NOREPINEPHRINE BITARTRATE 1 MG/ML IV SOLN
2.0000 ug/min | INTRAVENOUS | Status: DC
  Administered 2016-03-30: 35 ug/min via INTRAVENOUS
  Administered 2016-03-30: 13 ug/min via INTRAVENOUS
  Administered 2016-03-31 (×2): 33 ug/min via INTRAVENOUS
  Administered 2016-04-01: 30 ug/min via INTRAVENOUS
  Administered 2016-04-02: 10 ug/min via INTRAVENOUS
  Administered 2016-04-04: 15 ug/min via INTRAVENOUS
  Administered 2016-04-05: 20 ug/min via INTRAVENOUS
  Administered 2016-04-06: 6 ug/min via INTRAVENOUS
  Administered 2016-04-07: 10 ug/min via INTRAVENOUS
  Administered 2016-04-07: 12 ug/min via INTRAVENOUS
  Administered 2016-04-08: 14 ug/min via INTRAVENOUS
  Administered 2016-04-09: 13 ug/min via INTRAVENOUS
  Administered 2016-04-11: 8 ug/min via INTRAVENOUS
  Administered 2016-04-12: 7 ug/min via INTRAVENOUS
  Administered 2016-04-14: 4 ug/min via INTRAVENOUS
  Filled 2016-03-30 (×16): qty 16

## 2016-03-30 MED ORDER — LIDOCAINE HCL (CARDIAC) 20 MG/ML IV SOLN
INTRAVENOUS | Status: AC
  Filled 2016-03-30: qty 5

## 2016-03-30 MED ORDER — LACTATED RINGERS IV SOLN
INTRAVENOUS | Status: DC | PRN
  Administered 2016-03-30: 10:00:00 via INTRAVENOUS

## 2016-03-30 MED ORDER — PHENYLEPHRINE 40 MCG/ML (10ML) SYRINGE FOR IV PUSH (FOR BLOOD PRESSURE SUPPORT)
PREFILLED_SYRINGE | INTRAVENOUS | Status: AC
  Filled 2016-03-30: qty 10

## 2016-03-30 MED ORDER — FENTANYL CITRATE (PF) 250 MCG/5ML IJ SOLN
INTRAMUSCULAR | Status: AC
  Filled 2016-03-30: qty 5

## 2016-03-30 MED ORDER — EVICEL 5 ML EX KIT
PACK | CUTANEOUS | Status: AC
  Filled 2016-03-30: qty 1

## 2016-03-30 MED ORDER — ONDANSETRON HCL 4 MG/2ML IJ SOLN
INTRAMUSCULAR | Status: AC
  Filled 2016-03-30: qty 2

## 2016-03-30 MED ORDER — MAGNESIUM SULFATE 2 GM/50ML IV SOLN
2.0000 g | Freq: Once | INTRAVENOUS | Status: AC
  Administered 2016-03-30: 2 g via INTRAVENOUS
  Filled 2016-03-30: qty 50

## 2016-03-30 MED ORDER — PHENYLEPHRINE HCL 10 MG/ML IJ SOLN
INTRAMUSCULAR | Status: DC | PRN
  Administered 2016-03-30: 120 ug via INTRAVENOUS

## 2016-03-30 MED ORDER — PROPOFOL 10 MG/ML IV BOLUS
INTRAVENOUS | Status: AC
  Filled 2016-03-30: qty 20

## 2016-03-30 MED ORDER — POTASSIUM CHLORIDE IN NACL 20-0.9 MEQ/L-% IV SOLN
INTRAVENOUS | Status: AC
Start: 2016-03-30 — End: 2016-04-02
  Administered 2016-04-01 – 2016-04-02 (×2): 65 mL/h via INTRAVENOUS
  Filled 2016-03-30 (×5): qty 1000

## 2016-03-30 SURGICAL SUPPLY — 43 items
BLADE SURG ROTATE 9660 (MISCELLANEOUS) IMPLANT
CANISTER SUCTION 2500CC (MISCELLANEOUS) ×3 IMPLANT
CANISTER WOUND CARE 500ML ATS (WOUND CARE) ×5 IMPLANT
CATH ROBINSON RED A/P 16FR (CATHETERS) IMPLANT
CATH ROBINSON RED A/P 18FR (CATHETERS) IMPLANT
CATH ROBINSON RED A/P 20FR (CATHETERS) IMPLANT
CHLORAPREP W/TINT 26ML (MISCELLANEOUS) ×3 IMPLANT
COVER SURGICAL LIGHT HANDLE (MISCELLANEOUS) ×3 IMPLANT
DRAPE LAPAROSCOPIC ABDOMINAL (DRAPES) ×3 IMPLANT
DRAPE WARM FLUID 44X44 (DRAPE) ×3 IMPLANT
DRSG VAC ATS LRG SENSATRAC (GAUZE/BANDAGES/DRESSINGS) IMPLANT
DURAPREP 26ML APPLICATOR (WOUND CARE) IMPLANT
ELECT BLADE 6.5 EXT (BLADE) ×2 IMPLANT
ELECT CAUTERY BLADE 6.4 (BLADE) ×3 IMPLANT
ELECT REM PT RETURN 9FT ADLT (ELECTROSURGICAL) ×3
ELECTRODE REM PT RTRN 9FT ADLT (ELECTROSURGICAL) ×2 IMPLANT
GAUZE SPONGE 4X4 12PLY STRL (GAUZE/BANDAGES/DRESSINGS) ×3 IMPLANT
GLOVE BIO SURGEON STRL SZ7 (GLOVE) ×8 IMPLANT
GLOVE BIOGEL PI IND STRL 7.0 (GLOVE) ×2 IMPLANT
GLOVE BIOGEL PI IND STRL 7.5 (GLOVE) ×2 IMPLANT
GLOVE BIOGEL PI INDICATOR 7.0 (GLOVE) ×2
GLOVE BIOGEL PI INDICATOR 7.5 (GLOVE) ×1
GOWN STRL REUS W/ TWL LRG LVL3 (GOWN DISPOSABLE) ×5 IMPLANT
GOWN STRL REUS W/TWL LRG LVL3 (GOWN DISPOSABLE) ×9
KIT BASIN OR (CUSTOM PROCEDURE TRAY) ×3 IMPLANT
KIT ROOM TURNOVER OR (KITS) ×3 IMPLANT
LOOP VESSEL MAXI BLUE (MISCELLANEOUS) ×2 IMPLANT
NS IRRIG 1000ML POUR BTL (IV SOLUTION) ×6 IMPLANT
PACK GENERAL/GYN (CUSTOM PROCEDURE TRAY) ×3 IMPLANT
PAD ARMBOARD 7.5X6 YLW CONV (MISCELLANEOUS) ×6 IMPLANT
PAD NEG PRESSURE SENSATRAC (MISCELLANEOUS) ×3 IMPLANT
SPONGE ABDOMINAL VAC ABTHERA (MISCELLANEOUS) ×5 IMPLANT
STAPLER PROXIMATE 75MM BLUE (STAPLE) ×2 IMPLANT
STAPLER VISISTAT 35W (STAPLE) ×3 IMPLANT
SUCTION POOLE TIP (SUCTIONS) ×2 IMPLANT
SUT ETHILON 2 0 FS 18 (SUTURE) ×3 IMPLANT
SUT SILK 2 0 SH CR/8 (SUTURE) ×3 IMPLANT
SUT SILK 2 0 TIES 10X30 (SUTURE) ×2 IMPLANT
SUT VIC AB 3-0 SH 18 (SUTURE) ×2 IMPLANT
TOWEL OR 17X24 6PK STRL BLUE (TOWEL DISPOSABLE) ×1 IMPLANT
TOWEL OR 17X26 10 PK STRL BLUE (TOWEL DISPOSABLE) ×3 IMPLANT
TRAY FOLEY CATH 16FRSI W/METER (SET/KITS/TRAYS/PACK) ×3 IMPLANT
WATER STERILE IRR 1000ML POUR (IV SOLUTION) IMPLANT

## 2016-03-30 NOTE — Transfer of Care (Signed)
Immediate Anesthesia Transfer of Care Note  Patient: Christina Castaneda  Procedure(s) Performed: Procedure(s): ABDOMINAL VAC CHANGE (N/A) COLOSTOMY (N/A) COLON RESECTION LEFT (N/A)  Patient Location: SICU  Anesthesia Type:General  Level of Consciousness: sedated and Patient remains intubated per anesthesia plan  Airway & Oxygen Therapy: Patient remains intubated per anesthesia plan and Patient placed on Ventilator (see vital sign flow sheet for setting)  Post-op Assessment: Report given to RN and Post -op Vital signs reviewed and stable  Post vital signs: Reviewed  Last Vitals:  Filed Vitals:   03/30/16 0845 03/30/16 0900  BP: 108/61 123/58  Pulse: 76 75  Temp:    Resp: 16 17    Complications: No apparent anesthesia complications

## 2016-03-30 NOTE — Progress Notes (Signed)
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 She has a PMH of OSA, COPD, Smoker, MO (300 lbs) and will remain on vent till abd wound stabilized. PCCM asked to manage vent.  SUBJECTIVE:  Plan back to OR today for debridement Evolved shock overnight, started norepi  VITAL SIGNS: BP 123/58 mmHg  Pulse 75  Temp(Src) 99.2 F (37.3 C) (Oral)  Resp 17  Ht 5\' 6"  (1.676 m)  Wt 136.3 kg (300 lb 7.8 oz)  BMI 48.52 kg/m2  SpO2 100%  LMP 11/18/2012  HEMODYNAMICS: CVP:  [5 mmHg-15 mmHg] 9 mmHg  VENTILATOR SETTINGS: Vent Mode:  [-] PRVC FiO2 (%):  [50 %] 50 % Set Rate:  [16 bmp] 16 bmp Vt Set:  [600 mL] 600 mL PEEP:  [5 cmH20] 5 cmH20 Plateau Pressure:  [20 [id number] cmH20] 22 cmH20  INTAKE / OUTPUT: I/O last 3 completed shifts: In: [id number] [I.V.:5375.3; Other:20; IV [id number] Out: 2295 [Urine:895; Emesis/NG output:100; Drains:900; Stool:200; Blood:200]  PHYSICAL EXAMINATION: General:  MOWF sedated on vent Neuro:  Currently sedated. Arouse to voice but no follow commands HEENT:  No JVD, ETT-> vent OGT Cardiovascular:  HSR RRR Lungs: Decreased in bases Abdomen:  Open wound as noted Musculoskeletal:  intact Skin: warm  LABS:  BMET  Recent Labs Lab 03/28/16 1705 03/29/16 1411 03/30/16 0500  NA 140 134* 133*  K 3.9 4.4 4.2  CL 101 99* 102  CO2 29 26 23   BUN 7 7 8   CREATININE 1.12* 1.35* 1.23*  GLUCOSE 87 103* 156*    Electrolytes  Recent Labs Lab 03/28/16 1705 03/29/16 1411 03/30/16 0500  CALCIUM 8.9 7.9* 7.6*  MG  --  1.9 1.7  PHOS  --  3.8 3.8    CBC  Recent Labs Lab 03/28/16 1705 03/29/16 1411  03/30/16 0500  WBC 9.7 16.5* 14.0*  HGB 11.2* 11.0* 10.1*  HCT 35.8* 35.1* 31.8*  PLT 524* 427* 433*    Coag's No results for input(s): APTT, INR in the last 168 hours.  Sepsis Markers  Recent Labs Lab 03/28/16 1722 03/29/16 2000 03/29/16 2320  LATICACIDVEN 1.63 0.9 1.0    ABG  Recent Labs Lab 03/29/16 0320 03/30/16 0458  PHART 7.380 7.345*  PCO2ART 46.4* 41.9  PO2ART 135.0* 116.0*    Liver Enzymes  Recent Labs Lab 03/28/16 1705 03/29/16 1411 03/30/16 0500  AST 34 25 20  ALT 24 18 16   ALKPHOS 50 41 38  BILITOT 0.8 1.0 0.9  ALBUMIN 2.5* 2.1* 1.8*    Cardiac Enzymes No results for input(s): TROPONINI, PROBNP in the last 168 hours.  Glucose  Recent Labs Lab 03/29/16 1535 03/29/16 1724 03/29/16 1935 03/29/16 2321 03/30/16 0506 03/30/16 0831  GLUCAP 81 73 89 132* 155* 116*    Imaging Dg Chest Port 1 View  03/30/2016  CLINICAL DATA:  Hypoxia EXAM: PORTABLE CHEST 1 VIEW COMPARISON:  March 29, 2016 FINDINGS: Endotracheal tube tip is 5.3 cm above the carina. Central catheter tip is in the superior vena cava. Nasogastric tube tip and side port are below the diaphragm. No pneumothorax. There is a small pleural effusion on the right. Lungs elsewhere clear. Heart  is borderline enlarged with pulmonary vascular within normal limits. No adenopathy evident. IMPRESSION: Tube and catheter positions as described without pneumothorax. Small right effusion. Lungs elsewhere clear. Stable cardiac silhouette. Electronically Signed   By: Lowella Grip III M.D.   On: 03/30/2016 07:53     STUDIES:    CULTURES: 4/5 uc>> 4/5 bc x 2>>  ANTIBIOTICS: 4/5 cipro>> 4/5 flagyl>>  SIGNIFICANT EVENTS: 3/20 Exp lap appendectomy,removal sigmoid colon, bowel repair in setting diverticulitis. [facility] 4/6 Exp Lap , colostomy removal, partial colectomy,omentectom, wound debridement, open wound with wound vac.  LINES/TUBES: 4/6 et >> 4/6 abd wound vac>> 4/6 R PICC >>    DISCUSSION: MO(300 lb) female , smoker, asthma , who had exp lap 3/30 at [facility] with colostomy and presented 4/5 feculent drainage and required exp lap with open wound. She will remain on vent and return to OR in 24-48 hrs.   ASSESSMENT / PLAN:  PULMONARY A: VDRF post lap 4/6 Asthma Copd and tobacco abuse OSA P:   No wean till further abd surgery to stabilize wound  BD's May need NIMVS post extubation, likely needs CPAP long term  CARDIOVASCULAR A:  Septic Shock, due to peritonitis, cellulitis  Hx of Htn P:  Norepi started 4/7, titrate for MAP > 65 Hold antihypertensives for now, restart as indicated  RENAL Lab Results  Component Value Date   CREATININE 1.23* 03/30/2016   CREATININE 1.35* 03/29/2016   CREATININE 1.12* 03/28/2016   A:   Acute renal failure, oliguric P:   Follow BMP with improved perfusion  GASTROINTESTINAL A:     Post Exp lap with open wound, in discontinuity 4/6 P:   Back to OR per CCS plans PPI PICC for TNA  HEMATOLOGIC  Recent Labs  03/29/16 1411 03/30/16 0500  HGB 11.0* 10.1*  A:   DVT protection P:  Enoxaparin  Follow H/H  INFECTIOUS A:   Peritonitis  Ostomy infection / cellulitis P:   cipro + flagyl day 3 of X Consider addition vanco given skin and fascial involvement; would do so if clinical worsening  ENDOCRINE A:   Hypothyroid    P:   Synthroid IV Check TSH  NEUROLOGIC A:   Sedated on vent P:   RASS goal: -2 Open abd wound therefore heavier sedation till wound stabilized.   FAMILY  - Updates: None at bedside  - Inter-disciplinary family meet or Palliative Care meeting due by: 4/12   Independent critical care time is 32 minutes.    Baltazar Apo, MD, PhD 03/30/2016, 10:31 AM [facility] [phone] or if no answer [phone]

## 2016-03-30 NOTE — Care Management Note (Signed)
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  Pt may benefit from HH/DME at discharge; CM will continue to monitor for disposition needs   Expected Discharge Date:                  Expected Discharge Plan:  [place]  In-House Referral:     Discharge planning Services  CM Consult  Post Acute Care Choice:    Choice offered to:     DME Arranged:    DME Agency:     HH Arranged:    [place] Agency:     Status of Service:  In process, will continue to follow  Medicare Important Message Given:    Date Medicare IM Given:    Medicare IM give by:    Date Additional Medicare IM Given:    Additional Medicare Important Message give by:     If discussed at [place] of Stay Meetings, dates discussed:    Additional Comments:  Maryclare Labrador, RN 03/30/2016, 11:53 AM

## 2016-03-30 NOTE — Progress Notes (Signed)
PARENTERAL NUTRITION CONSULT NOTE - INITIAL  Pharmacy Consult for TPN Indication: Intolerance to enteral feeds  Allergies  Allergen Reactions  . Penicillins Anaphylaxis, Hives, Shortness Of Breath, Swelling and Other (See Comments)    Angioedema    Patient Measurements: Height: '5\' 6"'  (167.6 cm) Weight: (!) 300 lb 7.8 oz (136.3 kg) IBW/kg (Calculated) : 59.3  IBW: ~59.3kg Adjusted Body Weight: 82.4kg  Vital Signs: Temp: 99.2 F (37.3 C) (04/07 0408) Temp Source: Oral (04/07 0408) BP: 116/57 mmHg (04/07 0700) Pulse Rate: 77 (04/07 0700) Intake/Output from previous day: 04/06 0701 - 04/07 0700 In: 4884.2 [I.V.:3704; IV Piggyback:700; TPN:460.3] Out: 1280 [Urine:430; Emesis/NG output:100; Drains:750] Intake/Output from this shift:    Labs:  Recent Labs  03/28/16 1705 03/29/16 1411 03/30/16 0500  WBC 9.7 16.5* 14.0*  HGB 11.2* 11.0* 10.1*  HCT 35.8* 35.1* 31.8*  PLT 524* 427* 433*     Recent Labs  03/28/16 1705 03/29/16 0455 03/29/16 1411 03/30/16 0500  NA 140  --  134* 133*  K 3.9  --  4.4 4.2  CL 101  --  99* 102  CO2 29  --  26 23  GLUCOSE 87  --  103* 156*  BUN 7  --  7 8  CREATININE 1.12*  --  1.35* 1.23*  CALCIUM 8.9  --  7.9* 7.6*  MG  --   --  1.9 1.7  PHOS  --   --  3.8 3.8  PROT 6.6  --  5.3* 5.1*  ALBUMIN 2.5*  --  2.1* 1.8*  AST 34  --  25 20  ALT 24  --  18 16  ALKPHOS 50  --  41 38  BILITOT 0.8  --  1.0 0.9  PREALBUMIN  --   --   --  8.4*  TRIG  --  155*  --  75   Estimated Creatinine Clearance: 67.5 mL/min (by C-G formula based on Cr of 1.23).    Recent Labs  03/29/16 1935 03/29/16 2321 03/30/16 0506  GLUCAP 89 132* 155*    Medical History: Past Medical History  Diagnosis Date  . Goiter   . Hypothyroidism   . Asthma     mild  . Hypertension   . Atypical endometrial hyperplasia   . Heart murmur   . Vitamin B12 deficiency   . Yeast infection     for last 3 weeks  . Gout   . COPD (chronic obstructive pulmonary  disease) (HCC)     Insulin Requirements in the past 24 hours:  7 units/h on q6h SSI resistant  Assessment: 60 yof 2 weeks out from emergency colectomy/colostomy. Discharged 4/2, feculent discharge noted from midline wound when having staples removed. Noted to have necrotic stoma w/ peritonitis, fascial necrosis. Pharmacy consulted to start TPN due to expected prolonged ileus. Patient in the surgical ICU post-op on 4/6. No muscle or subcutaneous fat depletion noticed per RD assessment.  Surgeries/Procedures: ~2 wks pta: emergency colectomy/colostomy 4/6: colostomy removal and end stapling of stoma, partial colectomy, ex-lap, omentectomy, wound debridement 4/7: lap, VAC change, possible debridement  GI: s/p Hartmann's procedure ~2wks ago, POD#2 here. PPI IV, NGT, bowel rest, back to OR 4/7 for lap, VAC change, possible debridement. Pre-alb 8.4 Endo: No DM hx. CBGs 103-156 on SSI. hypothyroidism - synthroid Lytes: Na 133, K 4.4>4.2 (goal >/=4 w/ ileus), coCa~9.3, Mag 1.9>1.7 (goal >/=2 w/ ileus), phos 3.8 stable Renal: SCr 1.35>1.23, UOP 0.1 per documentation. IVF Pulm: COPD. Vent post-op - back to  OR 4/7. PCCM following Cards: hypotensive overnight 4/6 - NE gtt started Hepatobil: LFTs/alk phos/tbili wnl, TG 155>75 Neuro: sedated on propofol/fent gtts ID: Cipro/Flagyl D#2 for intra-abdominal infxn. AF, wbc down 14. BC ngtd. UC pending Best Practices: scds, lovenox ppx, ppi iv  TPN Access: PICC 03/29/16>> TPN start date: 03/29/16  Current Nutrition:  NPO Clinimix E 5/15 at 35 ml/h - 42g protein + 596 kcal daily Propofol gtt - received ~550 kcal in last 24 hours  IVF: NS+20k '@85'   Nutritional Goals: (per RD recs 4/6) 1460-4799 kCal, 145-155 grams of protein per day  Plan:  -Increase Clinimix E 5/15 to 55 ml/h. No lipids as propofol contributing ~550 kcal in last 24h hours. Start low and advance to goal as tolerated. -Will provide 1487 kcal + 66 g protein daily (including kcal from  propofol) -Add MVI + trace elements to TPN -Monitor CBGs on SSI -Mag sulfate 2g x1 -Decrease IVF to 65 ml/h when TPN starts at 1800 -F/u AM labs -Monitor TG on propofol  Elicia Lamp, PharmD, BCPS Clinical Pharmacist Pager [phone] 03/30/2016 8:15 AM

## 2016-03-30 NOTE — Progress Notes (Addendum)
RT suctioned patient and then took patient off the vent to have the CRNA manually bag the patient during transport to OR.

## 2016-03-30 NOTE — Progress Notes (Signed)
RT connected patient to vent upon returning to 2S11 from OR.

## 2016-03-30 NOTE — Anesthesia Preprocedure Evaluation (Signed)
Anesthesia Evaluation  Patient identified by MRN, date of birth, ID band  Reviewed: Allergy & Precautions, NPO status , Unable to perform ROS - Chart review only  Airway Mallampati: I  TM Distance: >3 FB Neck ROM: Full    Dental  (+) Teeth Intact, Dental Advisory Given   Pulmonary Current Smoker,    breath sounds clear to auscultation       Cardiovascular hypertension, Pt. on medications  Rhythm:Regular Rate:Normal     Neuro/Psych    GI/Hepatic   Endo/Other  Hypothyroidism Morbid obesity  Renal/GU      Musculoskeletal   Abdominal   Peds  Hematology   Anesthesia Other Findings   Reproductive/Obstetrics                             Anesthesia Physical Anesthesia Plan  ASA: III  Anesthesia Plan: General   Post-op Pain Management:    Induction: Intravenous  Airway Management Planned: Oral ETT  Additional Equipment:   Intra-op Plan:   Post-operative Plan: Extubation in OR  Informed Consent: I have reviewed the patients History and Physical, chart, labs and discussed the procedure including the risks, benefits and alternatives for the proposed anesthesia with the patient or authorized representative who has indicated his/her understanding and acceptance.   Dental advisory given  Plan Discussed with: CRNA, Anesthesiologist and Surgeon  Anesthesia Plan Comments:         Anesthesia Quick Evaluation

## 2016-03-30 NOTE — Anesthesia Procedure Notes (Signed)
Procedure Name: Intubation Date/Time: 03/30/2016 11:10 AM Performed by: Carney Living Pre-anesthesia Checklist: Patient identified, Emergency Drugs available, Suction available, Patient being monitored and Timeout performed Patient Re-evaluated:Patient Re-evaluated prior to inductionOxygen Delivery Method: Circle system utilized Tube size: 8.0 mm Number of attempts: 1 Airway Equipment and Method: Stylet Placement Confirmation: positive ETCO2 and breath sounds checked- equal and bilateral Secured at: 22 cm Dental Injury: Teeth and Oropharynx as per pre-operative assessment  Comments: ETT easily changed over cook catheter, + ETC02, BBS=, VSS

## 2016-03-30 NOTE — Op Note (Addendum)
Preoperative diagnosis: open abdomen, intestinal discontinuity Postoperative diagnosis: same as above Procedure:completion left colectomy with mobilization splenic flexure, end colostomy, placement of abdominal vac sponge Surgeon: Dr Serita Grammes Anes: general EBL: 50cc Specimens: left colon  Complications none Drains abdominal vac Sponge and needle count correct at end dispo to icu  Indications: This is a 44 yof who underwent colectomy and colostomy at outside facility.  She had necrosis of her colostomy and underwent surgery a couple nights ago.  She had left colon stapled off and an abdominal wound vac. She is going to return today.   Procedure: After informed consent was obtained she was taken to the operating room. She was already on antibiotic regimen. She had scds in place. Se was placed under general anesthesia without complication.The vac was removed.  Shee was prepped and draped in the standard sterile surgical fashion. A timeout was performed.  I inspected the abdomen and all was viable. I removed some more rectus muscle on the left. I elected to remove the remainder of the left colon to mid transverse colon in order to place a stoma in better location. I divided the transverse colon with a gia stapler and used the ligasure device to divided the mesentery. The splenic flexure was mobilized entirely to do this.  I then made a stoma site in the ruq to be out of way in case gastrostomy was placed and in thinner part of abdominal wall given history. It easily reached there without tension. I then made a cruciate incision in the fascia and entered the peritoneum. I brought the colon out.  The abdomen was not going to close today and likely the abdominal wall may need additional debridement.  I placed an abdominal wound vac and connected this underneath to the prior stoma site. I used vessel loops to apply tension to the skin.  I then matured the stoma with 3-0 vicryl and this was viable.   The vac was sealed and functional. An appliance was placed. She tolerated well and was transferred back to icu stable.

## 2016-03-30 NOTE — Progress Notes (Signed)
Patient ID: Christina Castaneda, female   DOB: [birth year], 60 y.o.   MRN: [id number]     [facility]      [address] [place], [place] [address]    Phone: [phone] FAX: [phone]     Subjective: Hypotensive, requiring levo.  Awake.  sCr improving.   uop down.   Objective:  Vital signs:  Filed Vitals:   03/30/16 0615 03/30/16 0630 03/30/16 0645 03/30/16 0700  BP: 118/54 112/58 109/58 116/57  Pulse: 72 72 75 77  Temp:      TempSrc:      Resp: '17 17 20 19  ' Height:      Weight:      SpO2: 100% 100% 100% 100%       Intake/Output   Yesterday:  04/06 0701 - 04/07 0700 In: 4884.2 [I.V.:3704; IV Piggyback:700; TPN:460.3] Out: 1280 [Urine:430; Emesis/NG output:100; Drains:750] This shift: I/O last 3 completed shifts: In: 5916.3 [I.V.:5375.3; Other:20; IV [id number]:701] Out: 2295 [Urine:895; Emesis/NG output:100; Drains:900; Stool:200; Blood:200]    Physical Exam: General: Pt awake on the vent.  Chest: scattered rhonchi.  CV: Pulses intact. Regular rhythm MS: Normal AROM mjr joints. No obvious deformity Abdomen: no bowel sounds are present. VAC in place with dark serosanguinous drainage. llq stoma with a sponge. Grimacing with exam.  Ext: SCDs BLE. No mjr edema. No cyanosis Skin: No petechiae / purpura    Problem List:   Active Problems:   Infection of colostomy stoma (HCC)   Colocutaneous fistula   Ventilator dependence ([place]) post exp lap with open abd   Hypothyroid   Morbid obesity ([place])   Open wound of abdomen    Results:   Labs: Results for orders placed or performed during the hospital encounter of 03/28/16 (from the past 48 hour(s))  Comprehensive metabolic panel     Status: Abnormal   Collection Time: 03/28/16  5:05 PM  Result Value Ref Range   Sodium 140 135 - 145 mmol/L   Potassium 3.9 3.5 - 5.1 mmol/L   Chloride 101 101 - 111 mmol/L   CO2 29 22 - 32 mmol/L   Glucose, Bld 87 65 - 99  mg/dL   BUN 7 6 - 20 mg/dL   Creatinine, Ser 1.12 (H) 0.44 - 1.00 mg/dL   Calcium 8.9 8.9 - 10.3 mg/dL   Total Protein 6.6 6.5 - 8.1 g/dL   Albumin 2.5 (L) 3.5 - 5.0 g/dL   AST 34 15 - 41 U/L   ALT 24 14 - 54 U/L   Alkaline Phosphatase 50 38 - 126 U/L   Total Bilirubin 0.8 0.3 - 1.2 mg/dL   GFR calc non Af Amer 52 (L) >60 mL/min   GFR calc Af Amer 60 (L) >60 mL/min    Comment: (NOTE) The eGFR has been calculated using the CKD EPI equation. This calculation has not been validated in all clinical situations. eGFR's persistently <60 mL/min signify possible Chronic Kidney Disease.    Anion gap 10 5 - 15  CBC WITH DIFFERENTIAL     Status: Abnormal   Collection Time: 03/28/16  5:05 PM  Result Value Ref Range   WBC 9.7 4.0 - 10.5 K/uL   RBC 3.81 (L) 3.87 - 5.11 MIL/uL   Hemoglobin 11.2 (L) 12.0 - 15.0 g/dL   HCT 35.8 (L) 36.0 - 46.0 %   MCV 94.0 78.0 - 100.0 fL   MCH 29.4 26.0 - 34.0 pg   MCHC 31.3 30.0 -  36.0 g/dL   RDW 15.9 (H) 11.5 - 15.5 %   Platelets 524 (H) 150 - 400 K/uL   Neutrophils Relative % 74 %   Neutro Abs 7.2 1.7 - 7.7 K/uL   Lymphocytes Relative 15 %   Lymphs Abs 1.4 0.7 - 4.0 K/uL   Monocytes Relative 10 %   Monocytes Absolute 1.0 0.1 - 1.0 K/uL   Eosinophils Relative 1 %   Eosinophils Absolute 0.1 0.0 - 0.7 K/uL   Basophils Relative 0 %   Basophils Absolute 0.0 0.0 - 0.1 K/uL  Blood Culture (routine x 2)     Status: None (Preliminary result)   Collection Time: 03/28/16  5:05 PM  Result Value Ref Range   Specimen Description BLOOD LEFT ANTECUBITAL    Special Requests BOTTLES DRAWN AEROBIC AND ANAEROBIC 5CC    Culture NO GROWTH < 24 HOURS    Report Status PENDING   Lipase, blood     Status: Abnormal   Collection Time: 03/28/16  5:05 PM  Result Value Ref Range   Lipase 64 (H) 11 - 51 U/L  Brain natriuretic peptide     Status: None   Collection Time: 03/28/16  5:05 PM  Result Value Ref Range   B Natriuretic Peptide 87.2 0.0 - 100.0 pg/mL  I-Stat venous  blood gas, ED ([facility], [facility])     Status: Abnormal   Collection Time: 03/28/16  5:17 PM  Result Value Ref Range   pH, Ven 7.470 (H) 7.250 - 7.300   pCO2, Ven 44.7 (L) 45.0 - 50.0 mmHg   pO2, Ven 17.0 (L) 31.0 - 45.0 mmHg   Bicarbonate 32.5 (H) 20.0 - 24.0 mEq/L   TCO2 34 0 - 100 mmol/L   O2 Saturation 28.0 %   Acid-Base Excess 8.0 (H) 0.0 - 2.0 mmol/L   Patient temperature HIDE    Sample type VENOUS    Comment NOTIFIED PHYSICIAN   I-Stat CG4 Lactic Acid, ED  (not at  [facility])     Status: None   Collection Time: 03/28/16  5:22 PM  Result Value Ref Range   Lactic Acid, Venous 1.63 0.5 - 2.0 mmol/L  Blood Culture (routine x 2)     Status: None (Preliminary result)   Collection Time: 03/28/16  5:34 PM  Result Value Ref Range   Specimen Description BLOOD LEFT HAND    Special Requests BOTTLES DRAWN AEROBIC AND ANAEROBIC 5CC    Culture NO GROWTH < 24 HOURS    Report Status PENDING   Urinalysis, Routine w reflex microscopic (not at [facility])     Status: Abnormal   Collection Time: 03/28/16  9:42 PM  Result Value Ref Range   Color, Urine YELLOW YELLOW   APPearance CLOUDY (A) CLEAR   Specific Gravity, Urine 1.025 1.005 - 1.030   pH 7.5 5.0 - 8.0   Glucose, UA NEGATIVE NEGATIVE mg/dL   Hgb urine dipstick NEGATIVE NEGATIVE   Bilirubin Urine NEGATIVE NEGATIVE   Ketones, ur 15 (A) NEGATIVE mg/dL   Protein, ur NEGATIVE NEGATIVE mg/dL   Nitrite NEGATIVE NEGATIVE   Leukocytes, UA NEGATIVE NEGATIVE    Comment: MICROSCOPIC NOT DONE ON URINES WITH NEGATIVE PROTEIN, BLOOD, LEUKOCYTES, NITRITE, OR GLUCOSE <1000 mg/dL.  Urine culture     Status: None (Preliminary result)   Collection Time: 03/28/16  9:42 PM  Result Value Ref Range   Specimen Description URINE, RANDOM    Special Requests NONE    Culture TOO YOUNG TO READ    Report Status  PENDING   Type and screen [place]     Status: None   Collection Time: 03/28/16 11:20 PM  Result Value Ref Range   ABO/RH(D) O POS    Antibody  Screen NEG    Sample Expiration 03/31/2016   ABO/Rh     Status: None   Collection Time: 03/28/16 11:20 PM  Result Value Ref Range   ABO/RH(D) O POS   MRSA PCR Screening     Status: None   Collection Time: 03/29/16  2:15 AM  Result Value Ref Range   MRSA by PCR NEGATIVE NEGATIVE    Comment:        The GeneXpert MRSA Assay (FDA approved for NASAL specimens only), is one component of a comprehensive MRSA colonization surveillance program. It is not intended to diagnose MRSA infection nor to guide or monitor treatment for MRSA infections.   I-STAT 3, arterial blood gas (G3+)     Status: Abnormal   Collection Time: 03/29/16  3:20 AM  Result Value Ref Range   pH, Arterial 7.380 7.350 - 7.450   pCO2 arterial 46.4 (H) 35.0 - 45.0 mmHg   pO2, Arterial 135.0 (H) 80.0 - 100.0 mmHg   Bicarbonate 27.4 (H) 20.0 - 24.0 mEq/L   TCO2 29 0 - 100 mmol/L   O2 Saturation 99.0 %   Acid-Base Excess 2.0 0.0 - 2.0 mmol/L   Patient temperature 98.69 F    Collection site RADIAL, ALLEN'S TEST ACCEPTABLE    Drawn by RT    Sample type ARTERIAL   Triglycerides     Status: Abnormal   Collection Time: 03/29/16  4:55 AM  Result Value Ref Range   Triglycerides 155 (H) <150 mg/dL  Glucose, capillary     Status: Abnormal   Collection Time: 03/29/16 10:40 AM  Result Value Ref Range   Glucose-Capillary 58 (L) 65 - 99 mg/dL   Comment 1 Capillary Specimen   Glucose, capillary     Status: None   Collection Time: 03/29/16 10:42 AM  Result Value Ref Range   Glucose-Capillary 79 65 - 99 mg/dL   Comment 1 Capillary Specimen   Glucose, capillary     Status: None   Collection Time: 03/29/16 11:47 AM  Result Value Ref Range   Glucose-Capillary 69 65 - 99 mg/dL  CBC with Differential/Platelet     Status: Abnormal   Collection Time: 03/29/16  2:11 PM  Result Value Ref Range   WBC 16.5 (H) 4.0 - 10.5 K/uL   RBC 3.66 (L) 3.87 - 5.11 MIL/uL   Hemoglobin 11.0 (L) 12.0 - 15.0 g/dL   HCT 35.1 (L) 36.0 - 46.0 %    MCV 95.9 78.0 - 100.0 fL   MCH 30.1 26.0 - 34.0 pg   MCHC 31.3 30.0 - 36.0 g/dL   RDW 16.3 (H) 11.5 - 15.5 %   Platelets 427 (H) 150 - 400 K/uL   Neutrophils Relative % 85 %   Lymphocytes Relative 6 %   Monocytes Relative 9 %   Eosinophils Relative 0 %   Basophils Relative 0 %   Neutro Abs 14.0 (H) 1.7 - 7.7 K/uL   Lymphs Abs 1.0 0.7 - 4.0 K/uL   Monocytes Absolute 1.5 (H) 0.1 - 1.0 K/uL   Eosinophils Absolute 0.0 0.0 - 0.7 K/uL   Basophils Absolute 0.0 0.0 - 0.1 K/uL  Comprehensive metabolic panel     Status: Abnormal   Collection Time: 03/29/16  2:11 PM  Result Value Ref Range  Sodium 134 (L) 135 - 145 mmol/L   Potassium 4.4 3.5 - 5.1 mmol/L   Chloride 99 (L) 101 - 111 mmol/L   CO2 26 22 - 32 mmol/L   Glucose, Bld 103 (H) 65 - 99 mg/dL   BUN 7 6 - 20 mg/dL   Creatinine, Ser 1.35 (H) 0.44 - 1.00 mg/dL   Calcium 7.9 (L) 8.9 - 10.3 mg/dL   Total Protein 5.3 (L) 6.5 - 8.1 g/dL   Albumin 2.1 (L) 3.5 - 5.0 g/dL   AST 25 15 - 41 U/L   ALT 18 14 - 54 U/L   Alkaline Phosphatase 41 38 - 126 U/L   Total Bilirubin 1.0 0.3 - 1.2 mg/dL   GFR calc non Af Amer 41 (L) >60 mL/min   GFR calc Af Amer 48 (L) >60 mL/min    Comment: (NOTE) The eGFR has been calculated using the CKD EPI equation. This calculation has not been validated in all clinical situations. eGFR's persistently <60 mL/min signify possible Chronic Kidney Disease.    Anion gap 9 5 - 15  Magnesium     Status: None   Collection Time: 03/29/16  2:11 PM  Result Value Ref Range   Magnesium 1.9 1.7 - 2.4 mg/dL  Phosphorus     Status: None   Collection Time: 03/29/16  2:11 PM  Result Value Ref Range   Phosphorus 3.8 2.5 - 4.6 mg/dL  Glucose, capillary     Status: None   Collection Time: 03/29/16  3:35 PM  Result Value Ref Range   Glucose-Capillary 81 65 - 99 mg/dL   Comment 1 Capillary Specimen   Glucose, capillary     Status: None   Collection Time: 03/29/16  5:24 PM  Result Value Ref Range   Glucose-Capillary 73 65  - 99 mg/dL  Glucose, capillary     Status: None   Collection Time: 03/29/16  7:35 PM  Result Value Ref Range   Glucose-Capillary 89 65 - 99 mg/dL  Lactic acid, plasma     Status: None   Collection Time: 03/29/16  8:00 PM  Result Value Ref Range   Lactic Acid, Venous 0.9 0.5 - 2.0 mmol/L  Lactic acid, plasma     Status: None   Collection Time: 03/29/16 11:20 PM  Result Value Ref Range   Lactic Acid, Venous 1.0 0.5 - 2.0 mmol/L  Glucose, capillary     Status: Abnormal   Collection Time: 03/29/16 11:21 PM  Result Value Ref Range   Glucose-Capillary 132 (H) 65 - 99 mg/dL   Comment 1 Venous Specimen   I-STAT 3, arterial blood gas (G3+)     Status: Abnormal   Collection Time: 03/30/16  4:58 AM  Result Value Ref Range   pH, Arterial 7.345 (L) 7.350 - 7.450   pCO2 arterial 41.9 35.0 - 45.0 mmHg   pO2, Arterial 116.0 (H) 80.0 - 100.0 mmHg   Bicarbonate 22.8 20.0 - 24.0 mEq/L   TCO2 24 0 - 100 mmol/L   O2 Saturation 98.0 %   Acid-base deficit 3.0 (H) 0.0 - 2.0 mmol/L   Patient temperature 99.2 F    Collection site RADIAL, ALLEN'S TEST ACCEPTABLE    Drawn by RT    Sample type ARTERIAL   CBC     Status: Abnormal   Collection Time: 03/30/16  5:00 AM  Result Value Ref Range   WBC 14.0 (H) 4.0 - 10.5 K/uL   RBC 3.32 (L) 3.87 - 5.11 MIL/uL   Hemoglobin  10.1 (L) 12.0 - 15.0 g/dL   HCT 31.8 (L) 36.0 - 46.0 %   MCV 95.8 78.0 - 100.0 fL   MCH 30.4 26.0 - 34.0 pg   MCHC 31.8 30.0 - 36.0 g/dL   RDW 16.3 (H) 11.5 - 15.5 %   Platelets 433 (H) 150 - 400 K/uL  Comprehensive metabolic panel     Status: Abnormal   Collection Time: 03/30/16  5:00 AM  Result Value Ref Range   Sodium 133 (L) 135 - 145 mmol/L   Potassium 4.2 3.5 - 5.1 mmol/L   Chloride 102 101 - 111 mmol/L   CO2 23 22 - 32 mmol/L   Glucose, Bld 156 (H) 65 - 99 mg/dL   BUN 8 6 - 20 mg/dL   Creatinine, Ser 1.23 (H) 0.44 - 1.00 mg/dL   Calcium 7.6 (L) 8.9 - 10.3 mg/dL   Total Protein 5.1 (L) 6.5 - 8.1 g/dL   Albumin 1.8 (L) 3.5  - 5.0 g/dL   AST 20 15 - 41 U/L   ALT 16 14 - 54 U/L   Alkaline Phosphatase 38 38 - 126 U/L   Total Bilirubin 0.9 0.3 - 1.2 mg/dL   GFR calc non Af Amer 46 (L) >60 mL/min   GFR calc Af Amer 53 (L) >60 mL/min    Comment: (NOTE) The eGFR has been calculated using the CKD EPI equation. This calculation has not been validated in all clinical situations. eGFR's persistently <60 mL/min signify possible Chronic Kidney Disease.    Anion gap 8 5 - 15  Prealbumin     Status: Abnormal   Collection Time: 03/30/16  5:00 AM  Result Value Ref Range   Prealbumin 8.4 (L) 18 - 38 mg/dL  Magnesium     Status: None   Collection Time: 03/30/16  5:00 AM  Result Value Ref Range   Magnesium 1.7 1.7 - 2.4 mg/dL  Phosphorus     Status: None   Collection Time: 03/30/16  5:00 AM  Result Value Ref Range   Phosphorus 3.8 2.5 - 4.6 mg/dL  Triglycerides     Status: None   Collection Time: 03/30/16  5:00 AM  Result Value Ref Range   Triglycerides 75 <150 mg/dL  Differential     Status: Abnormal   Collection Time: 03/30/16  5:00 AM  Result Value Ref Range   Neutrophils Relative % 88 %   Neutro Abs 12.4 (H) 1.7 - 7.7 K/uL   Lymphocytes Relative 5 %   Lymphs Abs 0.7 0.7 - 4.0 K/uL   Monocytes Relative 6 %   Monocytes Absolute 0.8 0.1 - 1.0 K/uL   Eosinophils Relative 1 %   Eosinophils Absolute 0.2 0.0 - 0.7 K/uL   Basophils Relative 0 %   Basophils Absolute 0.0 0.0 - 0.1 K/uL  Glucose, capillary     Status: Abnormal   Collection Time: 03/30/16  5:06 AM  Result Value Ref Range   Glucose-Capillary 155 (H) 65 - 99 mg/dL   Comment 1 Venous Specimen     Imaging / Studies: Ct Abdomen Pelvis W Contrast  03/28/2016  CLINICAL DATA:  Abdominal pain status post hemicolectomy for perforated diverticulitis 2 weeks prior 03/12/2016. Patient reports pain before and after surgery, progressive pain since hospital discharge. EXAM: CT ABDOMEN AND PELVIS WITH CONTRAST TECHNIQUE: Multidetector CT imaging of the abdomen  and pelvis was performed using the standard protocol following bolus administration of intravenous contrast. CONTRAST:  100 mL ISOVUE-300 IOPAMIDOL (ISOVUE-300) INJECTION 61% COMPARISON:  None available.  Preoperative CT not available. FINDINGS: Lower chest: Opacity in the medial posterior right lung base with small right pleural effusion. Trace dependent atelectasis in the left lower lobe. There mitral annulus calcifications. Liver: Prominent size measuring 23 cm cranial caudal. Probable steatosis. No focal lesion. Hepatobiliary: Clips in the gallbladder fossa postcholecystectomy. No biliary dilatation. Pancreas: No ductal dilatation or inflammation. Spleen: Normal. Adrenal glands: No nodule. Kidneys: Symmetric renal enhancement. No hydronephrosis. No perinephric stranding. Stomach/Bowel: Stomach is decompressed. Small hiatal hernia. There are no dilated or thickened small bowel loops. No small bowel obstruction. Left lower quadrant colostomy, contrast opacifies the entire colon. No large bowel obstruction, contrast exits the colostomy and is present in the ostomy bag. Contrast opacifies the distal sigmoid colon and rectum with unremarkable appearance of the Hartmann's pouch. No extraluminal contrast to suggest bowel perforation. Vascular/Lymphatic: No retroperitoneal adenopathy. Abdominal aorta is normal in caliber. Atherosclerosis of the abdominal aorta without aneurysm. Reproductive: Uterus is surgically absent.  No adnexal mass. Bladder: Physiologically distended. Intravesicular air is likely iatrogenic. Other: No intra-abdominal abscess or free air. Minimal pelvic stranding and trace free fluid is likely postoperative. Postsurgical change in the anterior abdominal wall with fat containing lower ventral abdominal wall hernia. Midline skin staples with ill-defined air in fluid in the subcutaneous tissues, without rim enhancing or loculated collection. Left flank edema. Musculoskeletal: There are no acute or  suspicious osseous abnormalities. Multilevel degenerative change in the spine. IMPRESSION: 1. Recent postsurgical change with left lower quadrant colostomy. No bowel obstruction or intra-abdominal abscess. 2. Midline abdominal skin staples remain in place. Air with ill-defined fluid in the subjacent subcutaneous tissues, without rim enhancing or drainable collection. This may reflect normal postsurgical findings, superimposed infection is difficult to exclude based on imaging findings alone. There is a fat containing lower ventral abdominal wall hernia. 3. Medial right lung base opacity, favor atelectasis, less likely pneumonia. Trace right pleural effusion. 4. Additional chronic findings as described. Electronically Signed   By: Jeb Levering M.D.   On: 03/28/2016 21:18   Dg Chest Port 1 View  03/30/2016  CLINICAL DATA:  Hypoxia EXAM: PORTABLE CHEST 1 VIEW COMPARISON:  March 29, 2016 FINDINGS: Endotracheal tube tip is 5.3 cm above the carina. Central catheter tip is in the superior vena cava. Nasogastric tube tip and side port are below the diaphragm. No pneumothorax. There is a small pleural effusion on the right. Lungs elsewhere clear. Heart is borderline enlarged with pulmonary vascular within normal limits. No adenopathy evident. IMPRESSION: Tube and catheter positions as described without pneumothorax. Small right effusion. Lungs elsewhere clear. Stable cardiac silhouette. Electronically Signed   By: Lowella Grip III M.D.   On: 03/30/2016 07:53   Dg Chest Port 1 View  03/29/2016  CLINICAL DATA:  Check endotracheal tube placement EXAM: PORTABLE CHEST 1 VIEW COMPARISON:  03/29/2016 FINDINGS: Endotracheal tube is again noted 4.7 cm above the carina. The nasogastric catheter courses into the stomach. Cardiac shadow is mildly enlarged but stable. The lungs are well aerated. IMPRESSION: Tubes and lines as described.  No acute abnormality noted. Electronically Signed   By: Inez Catalina M.D.   On:  03/29/2016 08:12   Dg Chest Port 1 View  03/29/2016  CLINICAL DATA:  Endotracheal tube placement. EXAM: PORTABLE CHEST 1 VIEW COMPARISON:  Radiographs yesterday at 1711 hour FINDINGS: Endotracheal tube 3.4 cm from the carina. Enteric tube in place tip below the diaphragm not included in the field of view. Lung volumes are low. Cardiomediastinal contours  are unchanged. Medial right lung base opacity on CT is not well visualized radiographically. No pulmonary edema, large pleural effusion or pneumothorax. IMPRESSION: Endotracheal and enteric tubes in place. Low lung volumes. Medial right lung base opacity on CT not well seen radiographically. Electronically Signed   By: Jeb Levering M.D.   On: 03/29/2016 02:33   Dg Chest Port 1 View  03/28/2016  CLINICAL DATA:  Shortness of breath and cough.  COPD.  Fever. EXAM: PORTABLE CHEST 1 VIEW COMPARISON:  11/14/2012 FINDINGS: The heart size and mediastinal contours are within normal limits. Both lungs are clear. The visualized skeletal structures are unremarkable. IMPRESSION: Normal chest. Electronically Signed   By: Lorriane Shire M.D.   On: 03/28/2016 17:43    Medications / Allergies:  Scheduled Meds: . albuterol  2.5 mg Nebulization TID  . antiseptic oral rinse  7 mL Mouth Rinse 10 times per day  . chlorhexidine gluconate (SAGE KIT)  15 mL Mouth Rinse BID  . ciprofloxacin  400 mg Intravenous Q12H  . enoxaparin (LOVENOX) injection  40 mg Subcutaneous Q24H  . insulin aspart  0-20 Units Subcutaneous 4 times per day  . levothyroxine  100 mcg Intravenous Daily  . metronidazole  500 mg Intravenous Q8H  . pantoprazole (PROTONIX) IV  40 mg Intravenous QHS  . sodium chloride flush  10-40 mL Intracatheter Q12H   Continuous Infusions: . Marland KitchenTPN (CLINIMIX-E) Adult 35 mL/hr at 03/30/16 0700  . 0.9 % NaCl with KCl 20 mEq / L 85 mL/hr at 03/30/16 0700  . fentaNYL infusion INTRAVENOUS 150 mcg/hr (03/30/16 0700)  . norepinephrine (LEVOPHED) Adult infusion    .  propofol (DIPRIVAN) infusion 20 mcg/kg/min (03/30/16 0700)   PRN Meds:.albuterol, fentaNYL, ondansetron **OR** ondansetron (ZOFRAN) IV, sodium chloride flush  Antibiotics: Anti-infectives    Start     Dose/Rate Route Frequency Ordered Stop   03/29/16 1200  ciprofloxacin (CIPRO) IVPB 400 mg     400 mg 200 mL/hr over 60 Minutes Intravenous Every 12 hours 03/29/16 0213     03/29/16 0600  [MAR Hold]  ciprofloxacin (CIPRO) IVPB 400 mg     (MAR Hold since 03/28/16 2357)   400 mg 200 mL/hr over 60 Minutes Intravenous On call to O.R. 03/28/16 2306 03/28/16 2355   03/29/16 0600  [MAR Hold]  metroNIDAZOLE (FLAGYL) IVPB 500 mg     (MAR Hold since 03/28/16 2357)   500 mg 100 mL/hr over 60 Minutes Intravenous On call to O.R. 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 Hulen Skains -NGT, bowel rest -OR today for laparotomy for VAC change, possible debridement  Hypotension-on levo Respiratory-full vent support, appreciate CCM assistance ID-cipro/flagyl.  ?change to Invanz  VTE prophylaxis-SCD/lovenox Neuro-propofol and fentanyl for sedation FEN-IVF, check labs. PCM-TPN started 4/6.  Prealbumin 8.4.  Hypothyroidism-start IV levo Dispo-ICU, OR today   Erby Pian, ANP-BC [facility] Pager [phone](7A-4:30P) For consults and floor pages  call [phone](7A-4:30P)  03/30/2016 7:56 AM

## 2016-03-30 NOTE — Anesthesia Postprocedure Evaluation (Signed)
Anesthesia Post Note  Patient: Labella Golob Manganaro  Procedure(s) Performed: Procedure(s) (LRB): ABDOMINAL VAC CHANGE (N/A) COLOSTOMY (N/A) COLON RESECTION LEFT (N/A)  Patient location during evaluation: ICU Anesthesia Type: General Level of consciousness: sedated and patient remains intubated per anesthesia plan Pain management: pain level controlled Vital Signs Assessment: post-procedure vital signs reviewed and stable Respiratory status: patient remains intubated per anesthesia plan and patient on ventilator - see flowsheet for VS Cardiovascular status: blood pressure returned to baseline Anesthetic complications: no    Last Vitals:  Filed Vitals:   03/30/16 0900 03/30/16 1134  BP: 123/58 115/55  Pulse: 75 98  Temp:  36.9 C  Resp: 17 17    Last Pain:  Filed Vitals:   03/30/16 1217  PainSc: 8                  Robie Oats A

## 2016-03-31 ENCOUNTER — Inpatient Hospital Stay (HOSPITAL_COMMUNITY): Payer: BLUE CROSS/BLUE SHIELD

## 2016-03-31 DIAGNOSIS — R6521 Severe sepsis with septic shock: Secondary | ICD-10-CM

## 2016-03-31 DIAGNOSIS — S31109A Unspecified open wound of abdominal wall, unspecified quadrant without penetration into peritoneal cavity, initial encounter: Secondary | ICD-10-CM

## 2016-03-31 DIAGNOSIS — A419 Sepsis, unspecified organism: Secondary | ICD-10-CM

## 2016-03-31 LAB — TSH: TSH: 4.183 u[IU]/mL (ref 0.350–4.500)

## 2016-03-31 LAB — BASIC METABOLIC PANEL
Anion gap: 6 (ref 5–15)
BUN: 8 mg/dL (ref 6–20)
CALCIUM: 7.2 mg/dL — AB (ref 8.9–10.3)
CO2: 21 mmol/L — AB (ref 22–32)
CREATININE: 1.08 mg/dL — AB (ref 0.44–1.00)
Chloride: 105 mmol/L (ref 101–111)
GFR calc non Af Amer: 54 mL/min — ABNORMAL LOW (ref >60)
Glucose, Bld: 149 mg/dL — ABNORMAL HIGH (ref 65–99)
Potassium: 4.8 mmol/L (ref 3.5–5.1)
SODIUM: 132 mmol/L — AB (ref 135–145)

## 2016-03-31 LAB — PROCALCITONIN: PROCALCITONIN: 0.85 ng/mL

## 2016-03-31 LAB — CBC
HCT: 32.1 % — ABNORMAL LOW (ref 36.0–46.0)
Hemoglobin: 9.6 g/dL — ABNORMAL LOW (ref 12.0–15.0)
MCH: 28.5 pg (ref 26.0–34.0)
MCHC: 29.9 g/dL — AB (ref 30.0–36.0)
MCV: 95.3 fL (ref 78.0–100.0)
PLATELETS: 448 10*3/uL — AB (ref 150–400)
RBC: 3.37 MIL/uL — ABNORMAL LOW (ref 3.87–5.11)
RDW: 16.3 % — ABNORMAL HIGH (ref 11.5–15.5)
WBC: 20.5 10*3/uL — ABNORMAL HIGH (ref 4.0–10.5)

## 2016-03-31 LAB — GLUCOSE, CAPILLARY
GLUCOSE-CAPILLARY: 133 mg/dL — AB (ref 65–99)
Glucose-Capillary: 153 mg/dL — ABNORMAL HIGH (ref 65–99)
Glucose-Capillary: 118 mg/dL — ABNORMAL HIGH (ref 65–99)

## 2016-03-31 LAB — PHOSPHORUS: PHOSPHORUS: 3.4 mg/dL (ref 2.5–4.6)

## 2016-03-31 LAB — MAGNESIUM: MAGNESIUM: 2.1 mg/dL (ref 1.7–2.4)

## 2016-03-31 MED ORDER — TRACE MINERALS CR-CU-MN-SE-ZN 10-1000-500-60 MCG/ML IV SOLN
INTRAVENOUS | Status: AC
  Administered 2016-03-31: 18:00:00 via INTRAVENOUS
  Filled 2016-03-31: qty 1560

## 2016-03-31 MED ORDER — SODIUM CHLORIDE 0.9 % IV BOLUS (SEPSIS)
500.0000 mL | Freq: Once | INTRAVENOUS | Status: AC
  Administered 2016-03-31: 500 mL via INTRAVENOUS

## 2016-03-31 NOTE — Progress Notes (Signed)
eLink Physician-Brief Progress Note Patient Name: Christina ABERG DOB: [birth year] MRN: [id number]   Date of Service  03/31/2016  HPI/Events of Note  Nurse reports gradual increase in NE gtt for BP support over past 7 hours.  Now at 35 mcg.  CVP through PICC line is 5 (? Accuracy).  Current MAP if 61.  Is net positive over past 24 to 48 hours over 5 liters.  eICU Interventions  Plan: Bolus of 500 cc NS for BP support     Intervention Category Intermediate Interventions: Hypotension - evaluation and management  DETERDING,ELIZABETH 03/31/2016, 12:36 AM

## 2016-03-31 NOTE — Progress Notes (Signed)
PULMONARY / CRITICAL CARE MEDICINE   Name: Christina Castaneda MRN: [id number] DOB: 09/21/1956    ADMISSION DATE:  03/28/2016 CONSULTATION DATE:  4/6  REFERRING MD:  CCS  CHIEF COMPLAINT:  Vent managment  HISTORY OF PRESENT ILLNESS:   MO WF with diverticulitis , surgery 3/20 Hartman's with colostomy at [facility], returns to [facility] with feculent drainage and required exp lap with open wound 4/6. She has a PMH of OSA, COPD, Smoker, MO (300 lbs) and will remain on vent till abd wound stabilized. PCCM asked to manage vent.  SUBJECTIVE: Patient with increasing pressure requirement overnight but did seem to respond to IVF bolus. Patient planned for repeat surgery on Monday (4/10). Patient nods no to any chest pain or difficulty breathing but does signify that she is having abdominal pain.  REVIEW OF SYSTEMS:  Unable to obtain given intubation & sedation.   VITAL SIGNS: BP 123/45 mmHg  Pulse 61  Temp(Src) 99.4 F (37.4 C) (Oral)  Resp 15  Ht 5\' 6"  (1.676 m)  Wt 300 lb 7.8 oz (136.3 kg)  BMI 48.52 kg/m2  SpO2 99%  LMP 11/18/2012  HEMODYNAMICS: CVP:  [5 mmHg-9 mmHg] 7 mmHg  VENTILATOR SETTINGS: Vent Mode:  [-] PRVC FiO2 (%):  [50 %] 50 % Set Rate:  [16 bmp] 16 bmp Vt Set:  [600 mL] 600 mL PEEP:  [5 cmH20] 5 cmH20 Plateau Pressure:  [20 cmH20-26 cmH20] 22 cmH20  INTAKE / OUTPUT: I/O last 3 completed shifts: In: 8635.1 [I.V.:5462.2; NG/GT:120; IV Piggyback:1600] Out: 3280 [Urine:1805; Emesis/NG output:200; Drains:1275]  PHYSICAL EXAMINATION: General:  Obese female. No distress. Family at bedside. Neuro:  Eyes open. Following commands. Nods to questions. HEENT:  Endotracheal tube in place. No scleral icterus or injection. Cardiovascular:  Regular rate. No appreciable JVD. Normal S1 & S2. Lungs: Symmetric chest rise on ventilator. Overall clear on auscultation.  Abdomen:  Soft. Protuberant. Wound vac in place with mild erythema around edge of open wound. Integument:  Warm & dry. No rash on  exposed skin. LABS:  BMET  Recent Labs Lab 03/29/16 1411 03/30/16 0500 03/31/16 0510  NA 134* 133* 132*  K 4.4 4.2 4.8  CL 99* 102 105  CO2 26 23 21*  BUN 7 8 8   CREATININE 1.35* 1.23* 1.08*  GLUCOSE 103* 156* 149*    Electrolytes  Recent Labs Lab 03/29/16 1411 03/30/16 0500 03/31/16 0510  CALCIUM 7.9* 7.6* 7.2*  MG 1.9 1.7 2.1  PHOS 3.8 3.8 3.4    CBC  Recent Labs Lab 03/29/16 1411 03/30/16 0500 03/31/16 0510  WBC 16.5* 14.0* 20.5*  HGB 11.0* 10.1* 9.6*  HCT 35.1* 31.8* 32.1*  PLT 427* 433* 448*    Coag's No results for input(s): APTT, INR in the last 168 hours.  Sepsis Markers  Recent Labs Lab 03/28/16 1722 03/29/16 2000 03/29/16 2320  LATICACIDVEN 1.63 0.9 1.0    ABG  Recent Labs Lab 03/29/16 0320 03/30/16 0458  PHART 7.380 7.345*  PCO2ART 46.4* 41.9  PO2ART 135.0* 116.0*    Liver Enzymes  Recent Labs Lab 03/28/16 1705 03/29/16 1411 03/30/16 0500  AST 34 25 20  ALT 24 18 16   ALKPHOS 50 41 38  BILITOT 0.8 1.0 0.9  ALBUMIN 2.5* 2.1* 1.8*    Cardiac Enzymes No results for input(s): TROPONINI, PROBNP in the last 168 hours.  Glucose  Recent Labs Lab 03/30/16 0506 03/30/16 0831 03/30/16 1206 03/30/16 1538 03/30/16 2358 03/31/16 0515  GLUCAP 155* 116* 107* 121* 150* 153*  Imaging Dg Chest Port 1 View  03/31/2016  CLINICAL DATA:  60 year old female with a history of acute respiratory failure. Asthma, hypertension, COPD. EXAM: PORTABLE CHEST 1 VIEW COMPARISON:  03/30/2016, 03/29/2016 FINDINGS: Cardiomediastinal silhouette unchanged in size and contour. Low lung volumes with bibasilar atelectasis. Opacity at the right base has improved, with improved aeration. Unchanged position of endotracheal tube terminating 5.5 cm above the carina. Unchanged gastric tube terminating out of the field of view. Unchanged right-sided PICC. IMPRESSION: Improved aeration at the right base, with decreased size of right pleural effusion.  Similar appearance of bilateral atelectasis. Unchanged endotracheal tube and gastric tube. Unchanged right-sided PICC Signed, Dulcy Fanny. Earleen Newport, DO [facility] Radiology Electronically Signed   By: Corrie Mckusick D.O.   On: 03/31/2016 08:30   Dg Chest Port 1 View  03/30/2016  CLINICAL DATA:  Follow-up endotracheal tube exchange EXAM: PORTABLE CHEST 1 VIEW COMPARISON:  03/30/2016 FINDINGS: Cardiomediastinal silhouette is stable. Persistent small right pleural effusion with right basilar atelectasis or infiltrate. Endotracheal tube in place with tip 4.4 cm above the carina. NG tube in place. Stable right arm PICC line position. No pulmonary edema. There is no pneumothorax. IMPRESSION: Persistent small right pleural effusion with right basilar atelectasis or infiltrate. Endotracheal tube in place with tip 4.4 cm above the carina. NG tube in place. Stable right arm PICC line position. No pulmonary edema. There is no pneumothorax. Electronically Signed   By: Lahoma Crocker M.D.   On: 03/30/2016 13:58     STUDIES:  PORT CXR 4/8:  Personally reviewed by me. Low lung volumes. Improving right basilar opacification. Endotracheal tube 6 cm above carina.  MICROBIOLOGY: Urine Ctx 4/5:  Multiple species present Blood Ctx x2 4/5>>> MRSA PCR 4/6:  Negative   ANTIBIOTICS: Cipro 4/5>>> Flagyl 4/5>>>  SIGNIFICANT EVENTS: 3/20 Exp lap appendectomy,removal sigmoid colon, bowel repair in setting diverticulitis. [facility] 4/6 Exp Lap , colostomy removal, partial colectomy,omentectom, wound debridement, open wound with wound vac.  LINES/TUBES: OETT 8.0 4/6>>> R PICC 4/6>>> L RADIAL ART LINE 4/7>>> FOLEY 4/6>>> NGT 4/6>>> WOUND VAC 4/6>>> PIV x2  ASSESSMENT / PLAN:  PULMONARY A: Acute Hypoxic Respiratory Failure H/O Asthma vs COPD Chronic Tobacco Use H/O OSA  P:   Advance ETT 2cm today No weaning for now Likely needs long-term CPAP & NIPPV post extubation Full  Vent Support for now Albuterol neb tid  CARDIOVASCULAR A:  Septic Shock - Worsening. Volume up. H/O HTN  P:  Levophed to maintain MAP >65 Vitals per unit protocol Monitor on telemetry  RENAL A:   Acute Renal Failure - Improving. UOP improving.  P:   Maintain MAP >65 Trending UOP with foley Monitoring electrolytes & renal function daily Replacing electrolytes as indicated  GASTROINTESTINAL A:     Post Exp lap with open wound, in discontinuity 4/6  P:   Surgery per CCS TPN  HEMATOLOGIC A:   Anemia - Mild. No obvious active bleeding Leukocytosis - Worsening. Likely secondary to sepsis.  P:  Trending cell counts daily Lovenox [place] daily SCDs  INFECTIOUS A:   Sepsis Peritonitis  Ostomy infection / cellulitis  P:   Empiric Cipro & Flagyl Day #4 Awaiting blood cultures Checking Procalcitonin per algorithm  ENDOCRINE A:   H/O Hypothyroidism   P:   Synthroid IV Resistant algorithm SSI Accu-Checks q6hr  NEUROLOGIC A:   Sedation on Ventilator Pain Control  P:   RASS goal: -2 Propofol gtt Fentanyl gtt & IV prn  FAMILY  -  Updates: Family updated at bedside by Dr. Ashok Cordia 4/8.  - Inter-disciplinary family meet or Palliative Care meeting due by: 4/12   TODAY'S SUMMARY:  MO(300 lb) female , smoker, asthma , who had exp lap 3/30 at [facility] with colostomy and presented 4/5 feculent drainage and required exp lap with open wound. She will remain on vent and return to OR on 4/10. Slight elevation in WBC concerning. Checking procalcitonin to guide further empiric antibiotics.  I have spent a total of 34 minutes of critical care time this morning caring for the patient and reviewing the patient's electronic medical record.   Sonia Baller Ashok Cordia, M.D. [facility] Pulmonary & Critical Care Pager:  [phone] After 3pm or if no response, call [phone]  03/31/2016, 10:51 AM

## 2016-03-31 NOTE — Progress Notes (Signed)
RT note- breath stacking

## 2016-03-31 NOTE — Progress Notes (Signed)
1 Day Post-Op  Subjective: Had to go up on pressor.   Objective: Vital signs in last 24 hours: Temp:  [98.2 F (36.8 C)-99.6 F (37.6 C)] 99.4 F (37.4 C) (04/08 0758) Pulse Rate:  [60-98] 61 (04/08 0800) Resp:  [13-26] 15 (04/08 0800) BP: (81-126)/(45-61) 123/45 mmHg (04/07 1654) SpO2:  [95 %-100 %] 99 % (04/08 0804) Arterial Line BP: (102-162)/(41-56) 116/51 mmHg (04/08 0800) FiO2 (%):  [50 %] 50 % (04/08 0804)    Intake/Output from previous day: 04/07 0701 - 04/08 0700 In: 5885.4 [I.V.:3532.6; NG/GT:120; IV Piggyback:1200; TPN:1032.8] Out: 2480 [Urine:1505; Emesis/NG output:100; Drains:875] Intake/Output this shift: Total I/O In: 115.7 [I.V.:60.7; TPN:55] Out: 50 [Urine:50]  Intubated, mild sedation. oe to voice Obese, OPEN abd wound vac in place. serosang drainage. TTP on L abd Colostomy viable - no air/stool in bag  Lab Results:   Recent Labs  03/30/16 0500 03/31/16 0510  WBC 14.0* 20.5*  HGB 10.1* 9.6*  HCT 31.8* 32.1*  PLT 433* 448*   BMET  Recent Labs  03/30/16 0500 03/31/16 0510  NA 133* 132*  K 4.2 4.8  CL 102 105  CO2 23 21*  GLUCOSE 156* 149*  BUN 8 8  CREATININE 1.23* 1.08*  CALCIUM 7.6* 7.2*   PT/INR No results for input(s): LABPROT, INR in the last 72 hours. ABG  Recent Labs  03/29/16 0320 03/30/16 0458  PHART 7.380 7.345*  HCO3 27.4* 22.8    Studies/Results: Dg Chest Port 1 View  03/31/2016  CLINICAL DATA:  60 year old female with a history of acute respiratory failure. Asthma, hypertension, COPD. EXAM: PORTABLE CHEST 1 VIEW COMPARISON:  03/30/2016, 03/29/2016 FINDINGS: Cardiomediastinal silhouette unchanged in size and contour. Low lung volumes with bibasilar atelectasis. Opacity at the right base has improved, with improved aeration. Unchanged position of endotracheal tube terminating 5.5 cm above the carina. Unchanged gastric tube terminating out of the field of view. Unchanged right-sided PICC. IMPRESSION: Improved aeration at  the right base, with decreased size of right pleural effusion. Similar appearance of bilateral atelectasis. Unchanged endotracheal tube and gastric tube. Unchanged right-sided PICC Signed, Dulcy Fanny. Earleen Newport, DO [facility] Radiology Electronically Signed   By: Corrie Mckusick D.O.   On: 03/31/2016 08:30   Dg Chest Port 1 View  03/30/2016  CLINICAL DATA:  Follow-up endotracheal tube exchange EXAM: PORTABLE CHEST 1 VIEW COMPARISON:  03/30/2016 FINDINGS: Cardiomediastinal silhouette is stable. Persistent small right pleural effusion with right basilar atelectasis or infiltrate. Endotracheal tube in place with tip 4.4 cm above the carina. NG tube in place. Stable right arm PICC line position. No pulmonary edema. There is no pneumothorax. IMPRESSION: Persistent small right pleural effusion with right basilar atelectasis or infiltrate. Endotracheal tube in place with tip 4.4 cm above the carina. NG tube in place. Stable right arm PICC line position. No pulmonary edema. There is no pneumothorax. Electronically Signed   By: Lahoma Crocker M.D.   On: 03/30/2016 13:58   Dg Chest Port 1 View  03/30/2016  CLINICAL DATA:  Hypoxia EXAM: PORTABLE CHEST 1 VIEW COMPARISON:  March 29, 2016 FINDINGS: Endotracheal tube tip is 5.3 cm above the carina. Central catheter tip is in the superior vena cava. Nasogastric tube tip and side port are below the diaphragm. No pneumothorax. There is a small pleural effusion on the right. Lungs elsewhere clear. Heart is borderline enlarged with pulmonary vascular within normal limits. No adenopathy evident. IMPRESSION: Tube and catheter positions as described without pneumothorax. Small right effusion.  Lungs elsewhere clear. Stable cardiac silhouette. Electronically Signed   By: Lowella Grip III M.D.   On: 03/30/2016 07:53    Anti-infectives: Anti-infectives    Start     Dose/Rate Route Frequency Ordered Stop   03/29/16 1200  ciprofloxacin (CIPRO)  IVPB 400 mg     400 mg 200 mL/hr over 60 Minutes Intravenous Every 12 hours 03/29/16 0213     03/29/16 0600  [MAR Hold]  ciprofloxacin (CIPRO) IVPB 400 mg     (MAR Hold since 03/28/16 2357)   400 mg 200 mL/hr over 60 Minutes Intravenous On call to O.R. 03/28/16 2306 03/28/16 2355   03/29/16 0600  [MAR Hold]  metroNIDAZOLE (FLAGYL) IVPB 500 mg     (MAR Hold since 03/28/16 2357)   500 mg 100 mL/hr over 60 Minutes Intravenous On call to O.R. 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 Hulen Skains POD #1, reexploration, complete resection of L colon, creation RUQ colostomy, debridement abdominal wall, place open vac -NGT, bowel rest -OR Monday for laparotomy for VAC change, possible debridement, possible feeding tube, possible closure, possible placement of ABRA- closure will be very difficult, incisional hernia 100% guaranteed.  Hypotension-on levo, CVP 6-8, could use addl IVF Respiratory-full vent support, appreciate CCM assistance ID-cipro/flagyl. wbc up but no fever. Monitor. May need to change up abx VTE prophylaxis-SCD/lovenox Neuro-propofol and fentanyl for sedation FEN-IVF, check labs. PCM-TPN started 4/6. Prealbumin 8.4.  Hypothyroidism-start IV levo Dispo-ICU, stays intubated given OPEN abdomen  Leighton Ruff. Redmond Pulling, MD, FACS General, Bariatric, & Minimally Invasive Surgery [facility]: [facility], [place]   LOS: 3 days    Gayland Curry 03/31/2016

## 2016-03-31 NOTE — Progress Notes (Signed)
PARENTERAL NUTRITION CONSULT NOTE - Follow Up  Pharmacy Consult for TPN Indication: Intolerance to enteral feeds  Allergies  Allergen Reactions  . Penicillins Anaphylaxis, Hives, Shortness Of Breath, Swelling and Other (See Comments)    Angioedema   Patient Measurements: Height: '5\' 6"'  (167.6 cm) Weight: (!) 300 lb 7.8 oz (136.3 kg) IBW/kg (Calculated) : 59.3  IBW: ~59.3kg Adjusted Body Weight: 82.4kg  Vital Signs: Temp: 98.2 F (36.8 C) (04/08 0330) Temp Source: Oral (04/08 0330) Pulse Rate: 66 (04/08 0700) Intake/Output from previous day: 04/07 0701 - 04/08 0700 In: 5885.4 [I.V.:3532.6; NG/GT:120; IV Piggyback:1200; TPN:1032.8] Out: 2480 [Urine:1505; Emesis/NG output:100; Drains:875] Intake/Output from this shift:   Labs:  Recent Labs  03/29/16 1411 03/30/16 0500 03/31/16 0510  WBC 16.5* 14.0* 20.5*  HGB 11.0* 10.1* 9.6*  HCT 35.1* 31.8* 32.1*  PLT 427* 433* 448*    Recent Labs  03/28/16 1705 03/29/16 0455 03/29/16 1411 03/30/16 0500 03/31/16 0510  NA 140  --  134* 133* 132*  K 3.9  --  4.4 4.2 4.8  CL 101  --  99* 102 105  CO2 29  --  26 23 21*  GLUCOSE 87  --  103* 156* 149*  BUN 7  --  '7 8 8  ' CREATININE 1.12*  --  1.35* 1.23* 1.08*  CALCIUM 8.9  --  7.9* 7.6* 7.2*  MG  --   --  1.9 1.7 2.1  PHOS  --   --  3.8 3.8 3.4  PROT 6.6  --  5.3* 5.1*  --   ALBUMIN 2.5*  --  2.1* 1.8*  --   AST 34  --  25 20  --   ALT 24  --  18 16  --   ALKPHOS 50  --  41 38  --   BILITOT 0.8  --  1.0 0.9  --   PREALBUMIN  --   --   --  8.4*  --   TRIG  --  155*  --  75  --    Estimated Creatinine Clearance: 76.8 mL/min (by C-G formula based on Cr of 1.08).   Recent Labs  03/30/16 1538 03/30/16 2358 03/31/16 0515  GLUCAP 121* 150* 153*   Medical History: Past Medical History  Diagnosis Date  . Goiter   . Hypothyroidism   . Asthma     mild  . Hypertension   . Atypical endometrial hyperplasia   . Heart murmur   . Vitamin B12 deficiency   . Yeast  infection     for last 3 weeks  . Gout   . COPD (chronic obstructive pulmonary disease) (HCC)    Insulin Requirements in the past 24 hours:  10 units/h on q6h SSI resistant  Admission: 60 yof 2 weeks out from emergency colectomy/colostomy. Discharged 4/2, feculent discharge noted from midline wound when having staples removed. Noted to have necrotic stoma w/ peritonitis, fascial necrosis. Pharmacy consulted to start TPN due to expected prolonged ileus.   Assessment: 4-7 Patient in the surgical ICU post-op on 4/6. No muscle or subcutaneous fat depletion noticed per RD assessment. 4-8 Patient remains intubated and on pressor support  Surgeries/Procedures: ~2 wks pta: emergency colectomy/colostomy 4/6: colostomy removal and end stapling of stoma, partial colectomy, ex-lap, omentectomy, wound debridement 4/7: lap, VAC change, possible debridement  GI: s/p Hartmann's procedure ~2wks ago, POD#2 here. PPI IV, NGT, bowel rest, back to OR 4/7 for lap, VAC change, possible debridement. Pre-alb 8.4 Endo: No DM hx. CBGs 149-156  on SSI. hypothyroidism - synthroid Lytes: Na 132, K 4.8>4.2 (goal >/=4 w/ ileus), coCa~9.3, Mag 2.1 (goal >/=2 w/ ileus), phos 3.4 stable Renal: SCr 1.08, UOP 0.1>>0.4 slightly improving Pulm: COPD. Vent post-op - back to OR 4/7. PCCM following Cards: hypotensive overnight 4/6 - NE gtt started Hepatobil: LFTs/alk phos/tbili wnl, TG 155>75 Neuro: sedated on propofol/fent gtts - RASS -2 ID: Cipro/Flagyl D#3 for intra-abdominal infxn. AF, wbc up - 20.5. BC ngtd. UC pending Best Practices: scds, lovenox ppx, ppi iv  TPN Access: PICC 03/29/16>> TPN start date: 03/29/16  Current Nutrition:  NPO Clinimix E 5/15 at 55 ml/h - 66g protein +  kcal daily Propofol gtt - received ~325 kcal in last 24 hours  IVF: NS+20k '@65'   Nutritional Goals: (per RD recs 4/6) 1496-1904 kCal, 145-155 grams of protein per day  Plan:  -Increase Clinimix E 5/15 to 65 ml/h. No lipids yet.  Propofol  contributing ~325 kcal in last 24h hours (~ 23%kcal from lipids).  -Will provide 1106 kcal + 78 g protein daily (will not be able to provide adequate protein needs with TPN).  Lipids from Propofol currently at 41mg/kg/min (12.337mhr) - ~ 325kcal for a total of ~1430kcal. -Monitor CBGs on SSI -F/u AM labs -Monitor TG on propofol  NiRober MinionPharmD., MS Clinical Pharmacist Pager:  [phone] you for allowing pharmacy to be part of this patients care team. 03/31/2016 7:32 AM

## 2016-04-01 DIAGNOSIS — K632 Fistula of intestine: Secondary | ICD-10-CM

## 2016-04-01 DIAGNOSIS — L8990 Pressure ulcer of unspecified site, unspecified stage: Secondary | ICD-10-CM | POA: Insufficient documentation

## 2016-04-01 LAB — RENAL FUNCTION PANEL
ALBUMIN: 1.2 g/dL — AB (ref 3.5–5.0)
Anion gap: 7 (ref 5–15)
BUN: 8 mg/dL (ref 6–20)
CALCIUM: 7.2 mg/dL — AB (ref 8.9–10.3)
CO2: 22 mmol/L (ref 22–32)
CREATININE: 1.08 mg/dL — AB (ref 0.44–1.00)
Chloride: 104 mmol/L (ref 101–111)
GFR, EST NON AFRICAN AMERICAN: 54 mL/min — AB (ref >60)
Glucose, Bld: 131 mg/dL — ABNORMAL HIGH (ref 65–99)
PHOSPHORUS: 2.1 mg/dL — AB (ref 2.5–4.6)
Potassium: 4.8 mmol/L (ref 3.5–5.1)
SODIUM: 133 mmol/L — AB (ref 135–145)

## 2016-04-01 LAB — TRIGLYCERIDES: Triglycerides: 48 mg/dL (ref <150)

## 2016-04-01 LAB — GLUCOSE, CAPILLARY
Glucose-Capillary: 124 mg/dL — ABNORMAL HIGH (ref 65–99)
Glucose-Capillary: 131 mg/dL — ABNORMAL HIGH (ref 65–99)
Glucose-Capillary: 131 mg/dL — ABNORMAL HIGH (ref 65–99)
Glucose-Capillary: 136 mg/dL — ABNORMAL HIGH (ref 65–99)

## 2016-04-01 LAB — CBC WITH DIFFERENTIAL/PLATELET
BASOS PCT: 0 %
Basophils Absolute: 0 10*3/uL (ref 0.0–0.1)
EOS PCT: 1 %
Eosinophils Absolute: 0.1 10*3/uL (ref 0.0–0.7)
HEMATOCRIT: 28.8 % — AB (ref 36.0–46.0)
HEMOGLOBIN: 8.8 g/dL — AB (ref 12.0–15.0)
LYMPHS PCT: 10 %
Lymphs Abs: 1.3 10*3/uL (ref 0.7–4.0)
MCH: 28.9 pg (ref 26.0–34.0)
MCHC: 30.6 g/dL (ref 30.0–36.0)
MCV: 94.7 fL (ref 78.0–100.0)
MONOS PCT: 13 %
Monocytes Absolute: 1.7 10*3/uL — ABNORMAL HIGH (ref 0.1–1.0)
NEUTROS PCT: 76 %
Neutro Abs: 9.9 10*3/uL — ABNORMAL HIGH (ref 1.7–7.7)
Platelets: 363 10*3/uL (ref 150–400)
RBC: 3.04 MIL/uL — AB (ref 3.87–5.11)
RDW: 16.1 % — AB (ref 11.5–15.5)
WBC: 13 10*3/uL — AB (ref 4.0–10.5)

## 2016-04-01 LAB — PROCALCITONIN: PROCALCITONIN: 0.62 ng/mL

## 2016-04-01 LAB — MAGNESIUM: Magnesium: 1.8 mg/dL (ref 1.7–2.4)

## 2016-04-01 MED ORDER — TRACE MINERALS CR-CU-MN-SE-ZN 10-1000-500-60 MCG/ML IV SOLN
INTRAVENOUS | Status: AC
Start: 2016-04-01 — End: 2016-04-02
  Administered 2016-04-01: 18:00:00 via INTRAVENOUS
  Filled 2016-04-01: qty 2280

## 2016-04-01 MED ORDER — MAGNESIUM SULFATE 2 GM/50ML IV SOLN
2.0000 g | Freq: Once | INTRAVENOUS | Status: AC
  Administered 2016-04-01: 2 g via INTRAVENOUS
  Filled 2016-04-01: qty 50

## 2016-04-01 MED ORDER — SODIUM PHOSPHATE 3 MMOLE/ML IV SOLN
30.0000 mmol | Freq: Once | INTRAVENOUS | Status: AC
  Administered 2016-04-01: 30 mmol via INTRAVENOUS
  Filled 2016-04-01: qty 10

## 2016-04-01 MED ORDER — ACETAMINOPHEN 160 MG/5ML PO SOLN
650.0000 mg | Freq: Four times a day (QID) | ORAL | Status: DC | PRN
Start: 2016-04-01 — End: 2016-04-28
  Administered 2016-04-01: 650 mg

## 2016-04-01 MED ORDER — ACETAMINOPHEN 160 MG/5ML PO SOLN
650.0000 mg | Freq: Four times a day (QID) | ORAL | Status: DC | PRN
  Filled 2016-04-01: qty 20.3

## 2016-04-01 NOTE — Progress Notes (Signed)
PULMONARY / CRITICAL CARE MEDICINE   Name: Christina Castaneda MRN: [id number] DOB: [birth year]    ADMISSION DATE:  03/28/2016 CONSULTATION DATE:  4/6  REFERRING MD:  CCS  CHIEF COMPLAINT:  Vent managment  HISTORY OF PRESENT ILLNESS:   MO WF with diverticulitis , surgery 3/20 Hartman's with colostomy at [facility], returns to [facility] with feculent drainage and required exp lap with open wound 4/6. She has a PMH of OSA, COPD, Smoker, MO (300 lbs) and will remain on vent till abd wound stabilized. PCCM asked to manage vent.  SUBJECTIVE: Febrile overnight. Cultures ordered. No other events overnight. Patient reporting only abdominal pain. Weaning pressor.  REVIEW OF SYSTEMS:  Unable to obtain given intubation & sedation.   VITAL SIGNS: BP 123/45 mmHg  Pulse 75  Temp(Src) 100.9 F (38.3 C) (Oral)  Resp 16  Ht 5\' 6"  (1.676 m)  Wt 316 lb 12.8 oz (143.7 kg)  BMI 51.16 kg/m2  SpO2 99%  LMP 11/18/2012  HEMODYNAMICS: CVP:  [5 mmHg-12 mmHg] 10 mmHg  VENTILATOR SETTINGS: Vent Mode:  [-] PRVC FiO2 (%):  [40 %-50 %] 40 % Set Rate:  [16 bmp] 16 bmp Vt Set:  [600 mL] 600 mL PEEP:  [5 cmH20] 5 cmH20 Plateau Pressure:  [11 cmH20-23 cmH20] 23 cmH20  INTAKE / OUTPUT: I/O last 3 completed shifts: In: 8436.6 [I.V.:4391.6; NG/GT:190; IV Piggyback:1800] Out: [id number] [Urine:3930; Emesis/NG output:400; Drains:1050]  PHYSICAL EXAMINATION: General:  Obese female. No distress. No family at bedside. Neuro:  Sleeping until awoken. Following commands. Nods to questions. HEENT:  Endotracheal tube in place. No scleral icterus or injection. Cardiovascular:  Regular rate. No appreciable JVD. Normal S1 & S2. Lungs: Symmetric chest rise on ventilator. Overall clear on auscultation.  Abdomen:  Soft. Protuberant. Questionable hypoactive bowel sounds. Wound vac in place. Integument:  Warm & dry. No rash on exposed skin. Mild erythema around edge of open abdominal wound.  LABS:  BMET  Recent Labs Lab 03/30/16 0500  03/31/16 0510 04/01/16 0320  NA 133* 132* 133*  K 4.2 4.8 4.8  CL 102 105 104  CO2 23 21* 22  BUN 8 8 8   CREATININE 1.23* 1.08* 1.08*  GLUCOSE 156* 149* 131*    Electrolytes  Recent Labs Lab 03/30/16 0500 03/31/16 0510 04/01/16 0320  CALCIUM 7.6* 7.2* 7.2*  MG 1.7 2.1 1.8  PHOS 3.8 3.4 2.1*    CBC  Recent Labs Lab 03/30/16 0500 03/31/16 0510 04/01/16 0320  WBC 14.0* 20.5* 13.0*  HGB 10.1* 9.6* 8.8*  HCT 31.8* 32.1* 28.8*  PLT 433* 448* 363    Coag's No results for input(s): APTT, INR in the last 168 hours.  Sepsis Markers  Recent Labs Lab 03/28/16 1722 03/29/16 2000 03/29/16 2320 03/31/16 1500 04/01/16 0320  LATICACIDVEN 1.63 0.9 1.0  --   --   PROCALCITON  --   --   --  0.85 0.62    ABG  Recent Labs Lab 03/29/16 0320 03/30/16 0458  PHART 7.380 7.345*  PCO2ART 46.4* 41.9  PO2ART 135.0* 116.0*    Liver Enzymes  Recent Labs Lab 03/28/16 1705 03/29/16 1411 03/30/16 0500 04/01/16 0320  AST 34 25 20  --   ALT 24 18 16   --   ALKPHOS 50 41 38  --   BILITOT 0.8 1.0 0.9  --   ALBUMIN 2.5* 2.1* 1.8* 1.2*    Cardiac Enzymes No results for input(s): TROPONINI, PROBNP in the last 168 hours.  Glucose  Recent Labs Lab 03/30/16 2358  03/31/16 0515 03/31/16 1138 03/31/16 1731 03/31/16 2357 04/01/16 0621  GLUCAP 150* 153* 133* 118* 136* 131*    Imaging No results found.   STUDIES:  PORT CXR 4/8:  Personally reviewed by me. Low lung volumes. Improving right basilar opacification. Endotracheal tube 6 cm above carina.  MICROBIOLOGY: Blood Ctx x2 4/9>>> Urine Ctx 4/9>>> Tracheal Asp Ctx 4/9>>> Blood Ctx x2 4/5>>> Urine Ctx 4/5:  Multiple species present MRSA PCR 4/6:  Negative   ANTIBIOTICS: Cipro 4/5>>> Flagyl 4/5>>>  SIGNIFICANT EVENTS: 3/20 Exp lap appendectomy,removal sigmoid colon, bowel repair in setting diverticulitis. [facility] 4/6 Exp Lap , colostomy removal, partial colectomy,omentectom, wound debridement, open wound  with wound vac.  LINES/TUBES: OETT 8.0 4/6>>> R PICC 4/6>>> L RADIAL ART LINE 4/7>>> FOLEY 4/6>>> NGT 4/6>>> WOUND VAC 4/6>>> PIV x2  ASSESSMENT / PLAN:  PULMONARY A: Acute Hypoxic Respiratory Failure H/O Asthma vs COPD Chronic Tobacco Use H/O OSA  P:   Advance ETT 2cm today No weaning for now Likely needs long-term CPAP & NIPPV post extubation Full Vent Support for now Albuterol neb tid  CARDIOVASCULAR A:  Septic Shock - Worsening. Volume up. H/O HTN  P:  Levophed to maintain MAP >65 Vitals per unit protocol Monitor on telemetry  RENAL A:   Acute Renal Failure - Improving. UOP improving. Hypophosphatemia - Replacing.  P:   Sodium Phos 61mmol IV Maintain MAP >65 Trending UOP with foley Monitoring electrolytes & renal function daily Replacing electrolytes as indicated  GASTROINTESTINAL A:     Post Exp lap with open wound, in discontinuity 4/6  P:   Surgery per CCS TPN  HEMATOLOGIC A:   Anemia - Worsening but question dilution. No obvious active bleeding Leukocytosis - Improving. Likely secondary to sepsis.  P:  Trending cell counts daily Lovenox [place] daily SCDs  INFECTIOUS A:   Sepsis Peritonitis  Ostomy infection / cellulitis  P:   Empiric Cipro & Flagyl Day #5 Awaiting blood cultures Repeat Blood, Urine, & Tracheal aspirate cultures Procalcitonin per algorithm - Improving.  ENDOCRINE A:   H/O Hypothyroidism   P:   Synthroid IV Resistant algorithm SSI Accu-Checks q6hr  NEUROLOGIC A:   Sedation on Ventilator Pain Control  P:   RASS goal: -2 Propofol gtt Fentanyl gtt & IV prn  FAMILY  - Updates: Family updated at bedside by Dr. Ashok Cordia 4/8.  - Inter-disciplinary family meet or Palliative Care meeting due by: 4/12   TODAY'S SUMMARY:  MO(300 lb) female , smoker, asthma , who had exp lap 3/30 at [facility] with colostomy and presented 4/5 feculent drainage and required exp lap with open wound. She will remain on vent and  return to OR on 4/10. Suspect improvement in leukocytosis & slight worsening in anemia due to dilution. Repeating cultures given fever but holding on changing antibiotics given improving Procalcitonin.   I have spent a total of 31 minutes of critical care time this morning caring for the patient and reviewing the patient's electronic medical record.   Sonia Baller Ashok Cordia, M.D. [facility] Pulmonary & Critical Care Pager:  [phone] After 3pm or if no response, call [phone]  04/01/2016, 7:25 AM

## 2016-04-01 NOTE — Progress Notes (Signed)
eLink Physician-Brief Progress Note Patient Name: Christina Castaneda DOB: 06-30-1956 MRN: [id number]   Date of Service  04/01/2016  HPI/Events of Note  Hypophosphatemia  eICU Interventions  Phos replaced     Intervention Category Intermediate Interventions: Electrolyte abnormality - evaluation and management  DETERDING,ELIZABETH 04/01/2016, 4:55 AM

## 2016-04-01 NOTE — Progress Notes (Signed)
eLink Physician-Brief Progress Note Patient Name: Christina Castaneda DOB: 1956/09/16 MRN: [id number]   Date of Service  04/01/2016  HPI/Events of Note  Temp of 102.4 with normal LFTs.  Blood cultures obtained and on broad spec ABX.  eICU Interventions  Order for tylenol via tube q6 hours prn temp greater than 100.28F     Intervention Category Minor Interventions: Routine modifications to care plan (e.g. PRN medications for pain, fever)  DETERDING,ELIZABETH 04/01/2016, 12:17 AM

## 2016-04-01 NOTE — Progress Notes (Signed)
2 Days Post-Op  Subjective: Remains on vent and sedated, responsive  Objective: Vital signs in last 24 hours: Temp:  [99.1 F (37.3 C)-102.4 F (39.1 C)] 100 F (37.8 C) (04/09 0833) Pulse Rate:  [64-91] 75 (04/09 0630) Resp:  [14-24] 16 (04/09 0630) SpO2:  [98 %-100 %] 99 % (04/09 0930) Arterial Line BP: (76-162)/(37-64) 137/62 mmHg (04/09 0900) FiO2 (%):  [40 %] 40 % (04/09 0930) Weight:  [143.7 kg (316 lb 12.8 oz)] 143.7 kg (316 lb 12.8 oz) (04/09 0400) Last BM Date:  (unknown. Pt intubated)  Intake/Output from previous day: 04/08 0701 - 04/09 0700 In: 5470 [I.V.:2980; NG/GT:130; IV Piggyback:900; TPN:1460] Out: [id number] [Urine:2935; Emesis/NG output:300; Drains:650] Intake/Output this shift: Total I/O In: 30 [NG/GT:30] Out: 400 [Urine:400]  Abdomen obese with VAC in place Ostomy viable  Lab Results:   Recent Labs  03/31/16 0510 04/01/16 0320  WBC 20.5* 13.0*  HGB 9.6* 8.8*  HCT 32.1* 28.8*  PLT 448* 363   BMET  Recent Labs  03/31/16 0510 04/01/16 0320  NA 132* 133*  K 4.8 4.8  CL 105 104  CO2 21* 22  GLUCOSE 149* 131*  BUN 8 8  CREATININE 1.08* 1.08*  CALCIUM 7.2* 7.2*   PT/INR No results for input(s): LABPROT, INR in the last 72 hours. ABG  Recent Labs  03/30/16 0458  PHART 7.345*  HCO3 22.8    Studies/Results: Dg Chest Port 1 View  03/31/2016  CLINICAL DATA:  60 year old female with a history of acute respiratory failure. Asthma, hypertension, COPD. EXAM: PORTABLE CHEST 1 VIEW COMPARISON:  03/30/2016, 03/29/2016 FINDINGS: Cardiomediastinal silhouette unchanged in size and contour. Low lung volumes with bibasilar atelectasis. Opacity at the right base has improved, with improved aeration. Unchanged position of endotracheal tube terminating 5.5 cm above the carina. Unchanged gastric tube terminating out of the field of view. Unchanged right-sided PICC. IMPRESSION: Improved aeration at the right base, with decreased size of right pleural effusion.  Similar appearance of bilateral atelectasis. Unchanged endotracheal tube and gastric tube. Unchanged right-sided PICC Signed, Dulcy Fanny. Earleen Newport, DO [facility] Electronically Signed   By: Corrie Mckusick D.O.   On: 03/31/2016 08:30   Dg Chest Port 1 View  03/30/2016  CLINICAL DATA:  Follow-up endotracheal tube exchange EXAM: PORTABLE CHEST 1 VIEW COMPARISON:  03/30/2016 FINDINGS: Cardiomediastinal silhouette is stable. Persistent small right pleural effusion with right basilar atelectasis or infiltrate. Endotracheal tube in place with tip 4.4 cm above the carina. NG tube in place. Stable right arm PICC line position. No pulmonary edema. There is no pneumothorax. IMPRESSION: Persistent small right pleural effusion with right basilar atelectasis or infiltrate. Endotracheal tube in place with tip 4.4 cm above the carina. NG tube in place. Stable right arm PICC line position. No pulmonary edema. There is no pneumothorax. Electronically Signed   By: Lahoma Crocker M.D.   On: 03/30/2016 13:58    Anti-infectives: Anti-infectives    Start     Dose/Rate Route Frequency Ordered Stop   03/29/16 1200  ciprofloxacin (CIPRO) IVPB 400 mg     400 mg 200 mL/hr over 60 Minutes Intravenous Every 12 hours 03/29/16 0213     03/29/16 0600  [MAR Hold]  ciprofloxacin (CIPRO) IVPB 400 mg     (MAR Hold since 03/28/16 2357)   400 mg 200 mL/hr over 60 Minutes Intravenous On call to O.R. 03/28/16 2306 03/28/16 2355   03/29/16 0600  [MAR Hold]  metroNIDAZOLE (FLAGYL) IVPB 500 mg     (  MAR Hold since 03/28/16 2357)   500 mg 100 mL/hr over 60 Minutes Intravenous On call to O.R. 03/28/16 2306 03/29/16 0025   03/29/16 0600  metroNIDAZOLE (FLAGYL) IVPB 500 mg     500 mg 100 mL/hr over 60 Minutes Intravenous Every 8 hours 03/29/16 0213     03/28/16 2300  ertapenem (INVANZ) 1 g in sodium chloride 0.9 % 50 mL IVPB  Status:  Discontinued     1 g 100 mL/hr over 30 Minutes Intravenous   Once 03/28/16 2259 03/28/16 2306      Assessment/Plan: s/p Procedure(s): ABDOMINAL VAC CHANGE (N/A) COLOSTOMY (N/A) COLON RESECTION LEFT (N/A)  Continuing ICU current care Dr. Redmond Pulling to return to OR tomorrow   LOS: 4 days    Christina Castaneda A 04/01/2016

## 2016-04-01 NOTE — Progress Notes (Signed)
PARENTERAL NUTRITION CONSULT NOTE - Follow Up  Pharmacy Consult for TPN Indication: Intolerance to enteral feeds  Allergies  Allergen Reactions  . Penicillins Anaphylaxis, Hives, Shortness Of Breath, Swelling and Other (See Comments)    Angioedema   Patient Measurements: Height: '5\' 6"'  (167.6 cm) Weight: (!) 316 lb 12.8 oz (143.7 kg) IBW/kg (Calculated) : 59.3  IBW: ~59.3kg Adjusted Body Weight: 82.4kg  Vital Signs: Temp: 100.9 F (38.3 C) (04/09 0454) Temp Source: Oral (04/09 0454) Pulse Rate: 75 (04/09 0630) Intake/Output from previous day: 04/08 0701 - 04/09 0700 In: 5300.3 [I.V.:2875.3; NG/GT:130; IV Piggyback:900; TPN:1395] Out: 3885 [Urine:2935; Emesis/NG output:300; Drains:650] Intake/Output from this shift:   Labs:  Recent Labs  03/30/16 0500 03/31/16 0510 04/01/16 0320  WBC 14.0* 20.5* 13.0*  HGB 10.1* 9.6* 8.8*  HCT 31.8* 32.1* 28.8*  PLT 433* 448* 363    Recent Labs  03/29/16 1411 03/30/16 0500 03/31/16 0510 04/01/16 0320  NA 134* 133* 132* 133*  K 4.4 4.2 4.8 4.8  CL 99* 102 105 104  CO2 26 23 21* 22  GLUCOSE 103* 156* 149* 131*  BUN '7 8 8 8  ' CREATININE 1.35* 1.23* 1.08* 1.08*  CALCIUM 7.9* 7.6* 7.2* 7.2*  MG 1.9 1.7 2.1 1.8  PHOS 3.8 3.8 3.4 2.1*  PROT 5.3* 5.1*  --   --   ALBUMIN 2.1* 1.8*  --  1.2*  AST 25 20  --   --   ALT 18 16  --   --   ALKPHOS 41 38  --   --   BILITOT 1.0 0.9  --   --   PREALBUMIN  --  8.4*  --   --   TRIG  --  75  --  48   Estimated Creatinine Clearance: 79.4 mL/min (by C-G formula based on Cr of 1.08).   Recent Labs  03/31/16 1731 03/31/16 2357 04/01/16 0621  GLUCAP 118* 136* 131*   Medical History: Past Medical History  Diagnosis Date  . Goiter   . Hypothyroidism   . Asthma     mild  . Hypertension   . Atypical endometrial hyperplasia   . Heart murmur   . Vitamin B12 deficiency   . Yeast infection     for last 3 weeks  . Gout   . COPD (chronic obstructive pulmonary disease) (HCC)     Insulin Requirements in the past 24 hours:  10 units/h on q6h SSI resistant  Admission: 60 yof 2 weeks out from emergency colectomy/colostomy. Discharged 4/2, feculent discharge noted from midline wound when having staples removed. Noted to have necrotic stoma w/ peritonitis, fascial necrosis. Pharmacy consulted to start TPN due to expected prolonged ileus.   Assessment: 4-7 Patient in the surgical ICU post-op on 4/6. No muscle or subcutaneous fat depletion noticed per RD assessment. 4-9 Triglycerides 48 on propofol this AM  Surgeries/Procedures: ~2 wks pta: emergency colectomy/colostomy 4/6: colostomy removal and end stapling of stoma, partial colectomy, ex-lap, omentectomy, wound debridement 4/7: lap, VAC change, possible debridement  GI: s/p Hartmann's procedure ~2wks ago, POD#3 here. PPI IV, NGT, bowel rest, back to OR 4/10 for lap, VAC change, possible debridement. Pre-alb 8.4 Endo: No DM hx. CBGs 130's on SSI. hypothyroidism - synthroid Lytes: Na 133, K 4.8 (goal >/=4 w/ ileus), coCa~9.3, Mag 1.8 (goal >/=2 w/ ileus), phos 2.1 (MD ordered 30Mmol sodium phos x 1 this AM).  4/7 Magnesium 2gm x 1, 4/9 Sodium Phos. 84mol x1 Renal: SCr 1.08, UOP 0.1>>0.4>0.9 much improved Pulm:  COPD. Vent post-op - back to OR 4/7. CXR - improved pleural effusion/aeration Cards: hypotensive overnight 4/6 - NE gtt titrated Hepatobil: LFTs/alk phos/tbili wnl, TG 155>75 Neuro: sedated on propofol/fent gtts - RASS -2 ID: Cipro/Flagyl D#4 for intra-abdominal infxn. Temp to 102.4 now 100.9, wbc up - 20.5>>13. BC ngtd. UC pending Best Practices: scds, lovenox ppx, ppi iv  TPN Access: PICC 03/29/16>> TPN start date: 03/29/16  Current Nutrition:  NPO Clinimix E 5/15 at 65 ml/h - 78g protein +  1108kcal daily Propofol gtt - at 57mg/kg/hr - received ~325 kcal in last 24 hours  IVF: NS+20k '@65'   Nutritional Goals: (per RD recs 4/6) 1496-1904 kCal, 145-155 grams of protein per day  Plan:  -Increase  Clinimix E 5/15 to 95 ml/h. No lipids yet.  Propofol contributing ~325 kcal in last 24h hours - At 965mhr she will receive 114gm protein, 1617kcal + 325 kcal from Propofol (~18% total kcal) for a total of 114 gm protein and 1942kcal.  We will not be able to meet protein needs and kcal goals with TPN - Magnesium bolus of 2gm x 1 -Monitor CBGs on SSI -F/u AM labs -Monitor TG on propofol  NiRober MinionPharmD., MS Clinical Pharmacist Pager:  [phone] you for allowing pharmacy to be part of this patients care team. 04/01/2016 7:21 AM

## 2016-04-02 ENCOUNTER — Inpatient Hospital Stay (HOSPITAL_COMMUNITY): Payer: BLUE CROSS/BLUE SHIELD | Admitting: Certified Registered Nurse Anesthetist

## 2016-04-02 ENCOUNTER — Encounter (HOSPITAL_COMMUNITY): Payer: Self-pay | Admitting: Certified Registered Nurse Anesthetist

## 2016-04-02 ENCOUNTER — Encounter (HOSPITAL_COMMUNITY): Admission: EM | Disposition: A | Discharge status: Rehabilitation Facility | Payer: Self-pay | Source: Home / Self Care

## 2016-04-02 HISTORY — PX: APPLICATION OF WOUND VAC: SHX5189

## 2016-04-02 HISTORY — PX: LAPAROTOMY: SHX154

## 2016-04-02 LAB — PREALBUMIN: PREALBUMIN: 3.2 mg/dL — AB (ref 18–38)

## 2016-04-02 LAB — GLUCOSE, CAPILLARY
GLUCOSE-CAPILLARY: 136 mg/dL — AB (ref 65–99)
Glucose-Capillary: 113 mg/dL — ABNORMAL HIGH (ref 65–99)
Glucose-Capillary: 140 mg/dL — ABNORMAL HIGH (ref 65–99)

## 2016-04-02 LAB — COMPREHENSIVE METABOLIC PANEL
ALT: 11 U/L — ABNORMAL LOW (ref 14–54)
AST: 17 U/L (ref 15–41)
Albumin: 1.2 g/dL — ABNORMAL LOW (ref 3.5–5.0)
Alkaline Phosphatase: 34 U/L — ABNORMAL LOW (ref 38–126)
Anion gap: 5 (ref 5–15)
BUN: 10 mg/dL (ref 6–20)
CHLORIDE: 104 mmol/L (ref 101–111)
CO2: 24 mmol/L (ref 22–32)
Calcium: 7.3 mg/dL — ABNORMAL LOW (ref 8.9–10.3)
Creatinine, Ser: 0.87 mg/dL (ref 0.44–1.00)
Glucose, Bld: 116 mg/dL — ABNORMAL HIGH (ref 65–99)
POTASSIUM: 4.7 mmol/L (ref 3.5–5.1)
Sodium: 133 mmol/L — ABNORMAL LOW (ref 135–145)
Total Bilirubin: 0.4 mg/dL (ref 0.3–1.2)
Total Protein: 4.4 g/dL — ABNORMAL LOW (ref 6.5–8.1)

## 2016-04-02 LAB — CULTURE, BLOOD (ROUTINE X 2)
CULTURE: NO GROWTH
Culture: NO GROWTH

## 2016-04-02 LAB — PROCALCITONIN: PROCALCITONIN: 0.27 ng/mL

## 2016-04-02 LAB — CBC WITH DIFFERENTIAL/PLATELET
BASOS ABS: 0 10*3/uL (ref 0.0–0.1)
Basophils Relative: 0 %
EOS ABS: 0.2 10*3/uL (ref 0.0–0.7)
EOS PCT: 2 %
HCT: 28 % — ABNORMAL LOW (ref 36.0–46.0)
Hemoglobin: 8.4 g/dL — ABNORMAL LOW (ref 12.0–15.0)
Lymphocytes Relative: 16 %
Lymphs Abs: 1.5 10*3/uL (ref 0.7–4.0)
MCH: 28.5 pg (ref 26.0–34.0)
MCHC: 30 g/dL (ref 30.0–36.0)
MCV: 94.9 fL (ref 78.0–100.0)
MONO ABS: 1.4 10*3/uL — AB (ref 0.1–1.0)
Monocytes Relative: 15 %
Neutro Abs: 6.3 10*3/uL (ref 1.7–7.7)
Neutrophils Relative %: 67 %
PLATELETS: 309 10*3/uL (ref 150–400)
RBC: 2.95 MIL/uL — AB (ref 3.87–5.11)
RDW: 16.1 % — AB (ref 11.5–15.5)
WBC: 9.4 10*3/uL (ref 4.0–10.5)

## 2016-04-02 LAB — PHOSPHORUS: PHOSPHORUS: 2.9 mg/dL (ref 2.5–4.6)

## 2016-04-02 LAB — TRIGLYCERIDES: Triglycerides: 50 mg/dL (ref <150)

## 2016-04-02 LAB — MAGNESIUM: MAGNESIUM: 1.8 mg/dL (ref 1.7–2.4)

## 2016-04-02 SURGERY — APPLICATION, WOUND VAC
Anesthesia: General | Site: Abdomen

## 2016-04-02 MED ORDER — FENTANYL CITRATE (PF) 100 MCG/2ML IJ SOLN
INTRAMUSCULAR | Status: DC | PRN
  Administered 2016-04-02: 25 ug via INTRAVENOUS
  Administered 2016-04-02: 50 ug via INTRAVENOUS
  Administered 2016-04-02: 25 ug via INTRAVENOUS

## 2016-04-02 MED ORDER — VANCOMYCIN HCL 10 G IV SOLR
2500.0000 mg | Freq: Once | INTRAVENOUS | Status: AC
  Administered 2016-04-02: 2500 mg via INTRAVENOUS
  Filled 2016-04-02: qty 2500

## 2016-04-02 MED ORDER — TRACE MINERALS CR-CU-MN-SE-ZN 10-1000-500-60 MCG/ML IV SOLN
INTRAVENOUS | Status: AC
  Administered 2016-04-02: 18:00:00 via INTRAVENOUS
  Filled 2016-04-02: qty 2640

## 2016-04-02 MED ORDER — 0.9 % SODIUM CHLORIDE (POUR BTL) OPTIME
TOPICAL | Status: DC | PRN
  Administered 2016-04-02: 2000 mL

## 2016-04-02 MED ORDER — SODIUM CHLORIDE 0.9% FLUSH
10.0000 mL | Freq: Two times a day (BID) | INTRAVENOUS | Status: DC
  Administered 2016-04-02 – 2016-04-06 (×3): 10 mL

## 2016-04-02 MED ORDER — MAGNESIUM SULFATE 50 % IJ SOLN
3.0000 g | Freq: Once | INTRAVENOUS | Status: AC
  Administered 2016-04-02: 3 g via INTRAVENOUS
  Filled 2016-04-02: qty 6

## 2016-04-02 MED ORDER — MIDAZOLAM HCL 2 MG/2ML IJ SOLN
INTRAMUSCULAR | Status: AC
  Filled 2016-04-02: qty 2

## 2016-04-02 MED ORDER — FENTANYL CITRATE (PF) 250 MCG/5ML IJ SOLN
INTRAMUSCULAR | Status: AC
  Filled 2016-04-02: qty 5

## 2016-04-02 MED ORDER — POTASSIUM CHLORIDE IN NACL 20-0.9 MEQ/L-% IV SOLN
INTRAVENOUS | Status: AC
  Administered 2016-04-03: 01:00:00 via INTRAVENOUS
  Filled 2016-04-02 (×2): qty 1000

## 2016-04-02 MED ORDER — ROCURONIUM BROMIDE 100 MG/10ML IV SOLN
INTRAVENOUS | Status: DC | PRN
  Administered 2016-04-02: 20 mg via INTRAVENOUS
  Administered 2016-04-02: 30 mg via INTRAVENOUS
  Administered 2016-04-02 (×2): 50 mg via INTRAVENOUS

## 2016-04-02 MED ORDER — VANCOMYCIN HCL 10 G IV SOLR
1500.0000 mg | Freq: Two times a day (BID) | INTRAVENOUS | Status: DC
  Administered 2016-04-03 – 2016-04-06 (×5): 1500 mg via INTRAVENOUS
  Filled 2016-04-02 (×7): qty 1500

## 2016-04-02 MED ORDER — MIDAZOLAM HCL 5 MG/5ML IJ SOLN
INTRAMUSCULAR | Status: DC | PRN
  Administered 2016-04-02 (×2): 2 mg via INTRAVENOUS

## 2016-04-02 MED ORDER — SODIUM CHLORIDE 0.9% FLUSH: 10.0000 mL | INTRAVENOUS | Status: DC | PRN

## 2016-04-02 MED ORDER — LACTATED RINGERS IV SOLN
INTRAVENOUS | Status: DC | PRN
  Administered 2016-04-02: 15:00:00 via INTRAVENOUS

## 2016-04-02 MED ORDER — PHENYLEPHRINE HCL 10 MG/ML IJ SOLN
INTRAMUSCULAR | Status: DC | PRN
  Administered 2016-04-02 (×3): 80 ug via INTRAVENOUS
  Administered 2016-04-02 (×2): 40 ug via INTRAVENOUS

## 2016-04-02 SURGICAL SUPPLY — 51 items
BLADE SURG ROTATE 9660 (MISCELLANEOUS) IMPLANT
CANISTER SUCTION 2500CC (MISCELLANEOUS) ×3 IMPLANT
CANISTER WOUND CARE 500ML ATS (WOUND CARE) ×3 IMPLANT
CHLORAPREP W/TINT 26ML (MISCELLANEOUS) ×2 IMPLANT
COVER SURGICAL LIGHT HANDLE (MISCELLANEOUS) ×3 IMPLANT
DRAPE LAPAROSCOPIC ABDOMINAL (DRAPES) ×3 IMPLANT
DRAPE LAPAROTOMY T 98X78 PEDS (DRAPES) IMPLANT
DRAPE UTILITY XL STRL (DRAPES) ×6 IMPLANT
DRAPE WARM FLUID 44X44 (DRAPE) ×3 IMPLANT
DRSG OPSITE POSTOP 4X10 (GAUZE/BANDAGES/DRESSINGS) IMPLANT
DRSG OPSITE POSTOP 4X8 (GAUZE/BANDAGES/DRESSINGS) IMPLANT
DRSG VAC ATS LRG SENSATRAC (GAUZE/BANDAGES/DRESSINGS) IMPLANT
DRSG VAC ATS MED SENSATRAC (GAUZE/BANDAGES/DRESSINGS) IMPLANT
DRSG VAC ATS SM SENSATRAC (GAUZE/BANDAGES/DRESSINGS) IMPLANT
DRSG VERSA FOAM LRG 10X15 (GAUZE/BANDAGES/DRESSINGS) IMPLANT
ELECT BLADE 6.5 EXT (BLADE) IMPLANT
ELECT CAUTERY BLADE 6.4 (BLADE) ×3 IMPLANT
ELECT REM PT RETURN 9FT ADLT (ELECTROSURGICAL) ×3
ELECTRODE REM PT RTRN 9FT ADLT (ELECTROSURGICAL) ×2 IMPLANT
GLOVE BIOGEL M STRL SZ7.5 (GLOVE) ×3 IMPLANT
GLOVE BIOGEL PI IND STRL 8 (GLOVE) ×2 IMPLANT
GLOVE BIOGEL PI INDICATOR 8 (GLOVE) ×1
GLOVE SURG SS PI 7.5 STRL IVOR (GLOVE) ×2 IMPLANT
GOWN STRL REUS W/ TWL LRG LVL3 (GOWN DISPOSABLE) ×4 IMPLANT
GOWN STRL REUS W/ TWL XL LVL3 (GOWN DISPOSABLE) ×2 IMPLANT
GOWN STRL REUS W/TWL LRG LVL3 (GOWN DISPOSABLE) ×6
GOWN STRL REUS W/TWL XL LVL3 (GOWN DISPOSABLE) ×3
KIT BASIN OR (CUSTOM PROCEDURE TRAY) ×3 IMPLANT
KIT OSTOMY DRAINABLE 2.75 STR (WOUND CARE) ×1 IMPLANT
KIT REMOVER STAPLE SKIN (MISCELLANEOUS) ×2 IMPLANT
KIT ROOM TURNOVER OR (KITS) ×3 IMPLANT
LIGASURE IMPACT 36 18CM CVD LR (INSTRUMENTS) IMPLANT
NS IRRIG 1000ML POUR BTL (IV SOLUTION) ×6 IMPLANT
PACK GENERAL/GYN (CUSTOM PROCEDURE TRAY) ×3 IMPLANT
PAD ARMBOARD 7.5X6 YLW CONV (MISCELLANEOUS) ×3 IMPLANT
SPECIMEN JAR LARGE (MISCELLANEOUS) IMPLANT
SPONGE ABDOMINAL VAC ABTHERA (MISCELLANEOUS) ×1 IMPLANT
SPONGE LAP 18X18 X RAY DECT (DISPOSABLE) IMPLANT
STAPLER VISISTAT 35W (STAPLE) ×2 IMPLANT
SUCTION POOLE TIP (SUCTIONS) ×3 IMPLANT
SUT PDS AB 1 TP1 96 (SUTURE) ×4 IMPLANT
SUT SILK 2 0 SH CR/8 (SUTURE) ×3 IMPLANT
SUT SILK 2 0 TIES 10X30 (SUTURE) ×3 IMPLANT
SUT SILK 3 0 SH CR/8 (SUTURE) ×2 IMPLANT
SUT SILK 3 0 TIES 10X30 (SUTURE) ×3 IMPLANT
SUT VIC AB 3-0 SH 18 (SUTURE) IMPLANT
TOWEL OR 17X24 6PK STRL BLUE (TOWEL DISPOSABLE) ×3 IMPLANT
TOWEL OR 17X26 10 PK STRL BLUE (TOWEL DISPOSABLE) ×3 IMPLANT
TRAY FOLEY CATH 16FRSI W/METER (SET/KITS/TRAYS/PACK) IMPLANT
WATER STERILE IRR 1000ML POUR (IV SOLUTION) IMPLANT
YANKAUER SUCT BULB TIP NO VENT (SUCTIONS) IMPLANT

## 2016-04-02 NOTE — Progress Notes (Signed)
eLink Physician-Brief Progress Note Patient Name: Christina Castaneda DOB: July 29, 1956 MRN: [id number]   Date of Service  04/02/2016  HPI/Events of Note  Blood culture from 04/01/2016 is positive for GPC in clusters.   eICU Interventions  Will order: 1. Vancomycin per pharmacy consultation.      Intervention Category Major Interventions: Acute renal failure - evaluation and management;Infection - evaluation and management  Sommer,Steven Eugene 04/02/2016, 8:09 PM

## 2016-04-02 NOTE — Anesthesia Preprocedure Evaluation (Addendum)
Anesthesia Evaluation  Patient identified by MRN, date of birth, ID band  Reviewed: Allergy & Precautions, NPO status , Unable to perform ROS - Chart review only  Airway Mallampati: Intubated  TM Distance: >3 FB Neck ROM: Full    Dental  (+) Teeth Intact, Dental Advisory Given   Pulmonary COPD, Current Smoker,    breath sounds clear to auscultation       Cardiovascular hypertension, Pt. on medications  Rhythm:Regular Rate:Normal     Neuro/Psych    GI/Hepatic   Endo/Other  Hypothyroidism Morbid obesity  Renal/GU      Musculoskeletal   Abdominal   Peds  Hematology   Anesthesia Other Findings   Reproductive/Obstetrics                            Lab Results  Component Value Date   WBC 9.4 04/02/2016   HGB 8.4* 04/02/2016   HCT 28.0* 04/02/2016   MCV 94.9 04/02/2016   PLT 309 04/02/2016   Lab Results  Component Value Date   CREATININE 0.87 04/02/2016   BUN 10 04/02/2016   NA 133* 04/02/2016   K 4.7 04/02/2016   CL 104 04/02/2016   CO2 24 04/02/2016    Anesthesia Physical  Anesthesia Plan  ASA: III  Anesthesia Plan: General   Post-op Pain Management:    Induction: Intravenous  Airway Management Planned: Oral ETT  Additional Equipment:   Intra-op Plan:   Post-operative Plan: Post-operative intubation/ventilation  Informed Consent: I have reviewed the patients History and Physical, chart, labs and discussed the procedure including the risks, benefits and alternatives for the proposed anesthesia with the patient or authorized representative who has indicated his/her understanding and acceptance.   Dental advisory given  Plan Discussed with: CRNA, Anesthesiologist and Surgeon  Anesthesia Plan Comments:        Anesthesia Quick Evaluation

## 2016-04-02 NOTE — Op Note (Signed)
03/28/2016 - 04/02/2016  4:15 PM  PATIENT:  Christina Castaneda  60 y.o. female  PRE-OPERATIVE DIAGNOSIS:  OPEN  ABDOMEN; History of perforated diverticulitis with sigmoid resection with end colostomy at [facility] in [place] complicated by necrotic-ischemic colostomy; status post reexploration, resection of colostomy,, completion left colectomy, creation of right upper quadrant colostomy and placement of open abdominal wound VAC  POST-OPERATIVE DIAGNOSIS:  same  PROCEDURE:  Procedure(s): Re-exploration of open abdomen, application of abdominal wound vac application of wound vac+  SURGEON:  Surgeon(s): Greer Pickerel, MD  ASSISTANTS: none   ANESTHESIA:   general  DRAINS: Nasogastric Tube and Urinary Catheter (Foley)   LOCAL MEDICATIONS USED:  NONE  SPECIMEN:  No Specimen  DISPOSITION OF SPECIMEN:  N/A  COUNTS:  YES  INDICATION FOR PROCEDURE: Patient is a morbidly obese Caucasian female who developed perforated diverticulitis that required emergency exploratory laparotomy in [place]. She underwent Hartman's procedure and returned to [place]. She presented to our institution with pain. She was found to have an ischemic colostomy.  Her colostomy was nonviable. She was taken to the operating room by my partner where the colostomy was resected and underwent drainage of intra-abdominal abscess, resection of  Part of her left rectus muscle due to necrosis and completion of left colectomy with creation of a new right upper quadrant colostomy and placement of an open abdominal wound VAC to her septic shock. She was brought back to the operating room today for reexploration, possible bowel resection possible abdominal closure. Please see chart for additional details regarding that discussion I had with the patient and her husband  PROCEDURE: She was brought to operating room one and placed supine on the operating room table. Her endotracheal circuit was connected to the anesthesia  circuit. Full deep General anesthesia was established.She was on therapeutic broad-spectrum antibiotics. The old wound VAC was removed. Her skin was prepped with Betadine. A surgical timeout was performed. I reexplored her abdomen carefully. I didn't get too aggressive due to her on was having bowel brain. Her left rectus muscle had been debrided as Dr. Donne Hazel had verbally discuss with me. Her peritoneum on that side looked decently healthy. Her colostomy was viable. There is no necrotic tissue left in the abdominal wall. There is no purulent fluid in the abdomen. There is just some simple serous fluid above the liver. At this point  Then began evaluating her abdominal wall muscle for potential closure. There was no way to bring her fascia back together primarily due to her morbid obesity and the loss of muscle on the left side. There is at least 15 cm between the right fascial edge and the most distal aspect of the left fascial edge. At this point I decided just to do simple 1 VAC change. The open abdominal wound VAC was replaced. The new colostomy appliance was  Applied. All needle, instrument, and sponge counts were correct 2. The patient was taken back to the ICU intubated.. I will discuss abdominal wall  Closure options with the patient's husband. Options include bridging the fascia with biologic mesh and covering with skin versus placing ABRA abdominal wound closure device.   PLAN OF CARE: ivu  PATIENT DISPOSITION:  ICU - intubated and hemodynamically stable.   Delay start of Pharmacological VTE agent (>24hrs) due to surgical blood loss or risk of bleeding:  No  Leighton Ruff. Redmond Pulling, MD, [place], Bariatric, & Minimally Invasive Surgery [facility], [place]   Leighton Ruff. Redmond Pulling, MD, [place],  Bariatric, & Minimally Invasive Surgery [facility], [place]

## 2016-04-02 NOTE — Progress Notes (Signed)
Initial Nutrition Assessment  DOCUMENTATION CODES:   Morbid obesity  INTERVENTION:    TPN per pharmacy  NUTRITION DIAGNOSIS:   Inadequate oral intake related to inability to eat as evidenced by NPO status, ongoing  GOAL:   Provide needs based on ASPEN/SCCM guidelines, met  MONITOR:   Vent status, Labs, Weight trends, Skin, I & O's, TPN prescription   ASSESSMENT:   60 y.o. Female presented with postoperative complication. 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 She went to a nearby hospital and was diagnosed with perforated diverticulitis. She underwent an exploratory laparotomy with sigmoid colon resection on March 20. She was discharged from the hospital there after a week and just returned home to [place] 5 days ago. Yesterday she reports developing leakage of fecal matter from her midline surgical incision site. She continues to have normal ostomy output from her left lower quadrant stoma. Over the last 24 hours she has also experienced increased generalized abdominal pain.  Patient s/p procedure 4/6: COLOSTOMY REMOVAL AND END STAPLING OF STOMA PARTIAL COLECTOMY EXPLORATORY LAPAROTOMY OMENTECTOMY DEBRIDEMENT WOUND  Patient is currently intubated on ventilator support -- NGT to LIS Temp (24hrs), Avg:98 F (36.7 C), Min:97.9 F (36.6 C), Max:98.2 F (36.8 C)   Propofol: 4.1 ml/hr ----> 108 fat kcals   Patient is receiving TPN with Clinimix E 5/15 @ 110 ml/hr.  No lipids at this time.  TPN + Propofol infusion to provide 1982 kcal and 132 grams protein per day.  Meets 100% minimum estimated energy needs and 90% minimum estimated protein needs.  Diet Order:  Diet NPO time specified .TPN (CLINIMIX-E) Adult .TPN (CLINIMIX-E) Adult  Skin:  Wound (see comment) (abdominal wound VAC)  Last BM:  N/A  Height:   Ht Readings from Last 1 Encounters:  03/29/16 '5\' 6"'  (1.676 m)    Weight:    Wt Readings from Last 1 Encounters:  04/01/16 316 lb 12.8 oz (143.7 kg)    Ideal Body Weight:  59 kg  BMI:  Body mass index is 51.16 kg/(m^2).  Estimated Nutritional Needs:   Kcal:  9678-9381  Protein:  145-155 gm  Fluid:  per MD  EDUCATION NEEDS:   No education needs identified at this time  Arthur Holms, RD, LDN Pager #: [phone] After-Hours Pager #: [phone]

## 2016-04-02 NOTE — Anesthesia Postprocedure Evaluation (Signed)
Anesthesia Post Note  Patient: Christina Castaneda  Procedure(s) Performed: Procedure(s) (LRB): Re-exploration of open abdomen, application of abdominal wound vac (N/A) application of wound vac+ (N/A)  Patient location during evaluation: SICU Anesthesia Type: General Level of consciousness: sedated Pain management: pain level controlled Vital Signs Assessment: post-procedure vital signs reviewed and stable Respiratory status: patient remains intubated per anesthesia plan Cardiovascular status: stable Anesthetic complications: no    Last Vitals:  Filed Vitals:   04/02/16 1218 04/02/16 1300  BP:    Pulse:  60  Temp: 36.8 C   Resp:  16    Last Pain:  Filed Vitals:   04/02/16 1457  PainSc: 8                  Tiajuana Amass

## 2016-04-02 NOTE — Progress Notes (Signed)
3 Days Post-Op  Subjective: No changes.   Objective: Vital signs in last 24 hours: Temp:  [97.9 F (36.6 C)-98.9 F (37.2 C)] 97.9 F (36.6 C) (04/10 0824) Pulse Rate:  [51-79] 57 (04/10 1000) Resp:  [12-20] 16 (04/10 1000) BP: (103-135)/(54-58) 103/54 mmHg (04/10 0806) SpO2:  [95 %-100 %] 97 % (04/10 1000) Arterial Line BP: (79-258)/(41-146) 123/53 mmHg (04/10 1000) FiO2 (%):  [40 %] 40 % (04/10 1000) Last BM Date:  (pt intubated, date unknown)  Intake/Output from previous day: 04/09 0701 - 04/10 0700 In: 5045.3 [I.V.:2635.9; NG/GT:90; IV Piggyback:650; TPN:1669.4] Out: 3770 [Urine:2820; Emesis/NG output:250; Drains:700] Intake/Output this shift: Total I/O In: 623.5 [I.V.:308.5; NG/GT:30; TPN:285] Out: 220 [Urine:120; Emesis/NG output:50; Drains:50]  Awake, on vent Obese open abd. Wound vac intact. serosang drainage  Lab Results:   Recent Labs  04/01/16 0320 04/02/16 0349  WBC 13.0* 9.4  HGB 8.8* 8.4*  HCT 28.8* 28.0*  PLT 363 309   BMET  Recent Labs  04/01/16 0320 04/02/16 0349  NA 133* 133*  K 4.8 4.7  CL 104 104  CO2 22 24  GLUCOSE 131* 116*  BUN 8 10  CREATININE 1.08* 0.87  CALCIUM 7.2* 7.3*   PT/INR No results for input(s): LABPROT, INR in the last 72 hours. ABG No results for input(s): PHART, HCO3 in the last 72 hours.  Invalid input(s): PCO2, PO2  Studies/Results: No results found.  Anti-infectives: Anti-infectives    Start     Dose/Rate Route Frequency Ordered Stop   03/29/16 1200  ciprofloxacin (CIPRO) IVPB 400 mg     400 mg 200 mL/hr over 60 Minutes Intravenous Every 12 hours 03/29/16 0213     03/29/16 0600  [MAR Hold]  ciprofloxacin (CIPRO) IVPB 400 mg     (MAR Hold since 03/28/16 2357)   400 mg 200 mL/hr over 60 Minutes Intravenous On call to O.R. 03/28/16 2306 03/28/16 2355   03/29/16 0600  [MAR Hold]  metroNIDAZOLE (FLAGYL) IVPB 500 mg     (MAR Hold since 03/28/16 2357)   500 mg 100 mL/hr over 60 Minutes Intravenous On call  to O.R. 03/28/16 2306 03/29/16 0025   03/29/16 0600  metroNIDAZOLE (FLAGYL) IVPB 500 mg     500 mg 100 mL/hr over 60 Minutes Intravenous Every 8 hours 03/29/16 0213     03/28/16 2300  ertapenem (INVANZ) 1 g in sodium chloride 0.9 % 50 mL IVPB  Status:  Discontinued     1 g 100 mL/hr over 30 Minutes Intravenous  Once 03/28/16 2259 03/28/16 2306      Assessment/Plan: s/p Procedure(s): ABDOMINAL VAC CHANGE (N/A) COLOSTOMY (N/A) COLON RESECTION LEFT (N/A)  Plan to return to OR today Discussed with pt and husband via phone risks of surgery - HERNIA!!!, just a wound vac change today, potential bowel resection,fistula, infection, injury to surrounding structures, dehiscence, need for addl procedures, need for blood, potential feeding tube, prolonged ICU stay. Discussed that given her obesity, PCMN, rectus muscle debridement - surgery will be challenging and high risk for problems as described above.   Leighton Ruff. Redmond Pulling, MD, FACS General, Bariatric, & Minimally Invasive Surgery [facility], [place]   LOS: 5 days    Gayland Curry 04/02/2016

## 2016-04-02 NOTE — Progress Notes (Signed)
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 She has a PMH of OSA, COPD, Smoker, MO (300 lbs) and will remain on vent till abd wound stabilized. PCCM asked to manage vent.  SUBJECTIVE: No events overnight. Not febrile today AM  REVIEW OF SYSTEMS:  Unable to obtain given intubation & sedation.   VITAL SIGNS: BP 103/54 mmHg  Pulse 57  Temp(Src) 97.9 F (36.6 C) (Axillary)  Resp 16  Ht 5\' 6"  (1.676 m)  Wt 316 lb 12.8 oz (143.7 kg)  BMI 51.16 kg/m2  SpO2 97%  LMP 11/18/2012  HEMODYNAMICS: CVP:  [8 mmHg-16 mmHg] 9 mmHg  VENTILATOR SETTINGS: Vent Mode:  [-] PRVC FiO2 (%):  [40 %] 40 % Set Rate:  [16 bmp] 16 bmp Vt Set:  [600 mL] 600 mL PEEP:  [5 cmH20] 5 cmH20 Plateau Pressure:  [18 cmH20-23 cmH20] 18 cmH20  INTAKE / OUTPUT: I/O last 3 completed shifts: In: 7956.9 [I.V.:4107.5; NG/GT:150; IV Piggyback:1250] Out: 6005 [Urine:4605; Emesis/NG output:400; Drains:1000]  PHYSICAL EXAMINATION: General:  No distress. Obeys commands Neuro:  Moves all ext. No focal deficits.  HEENT:  ETT in place, no thyormegaly, JVD Cardiovascular:  RRR, no MRG Lungs: Clear. No wheeze, crackles Abdomen:  Soft, Wound vac in place. Integument:  Warm & dry. No rash.  LABS:  BMET  Recent Labs Lab 03/31/16 0510 04/01/16 0320 04/02/16 0349  NA 132* 133* 133*  K 4.8 4.8 4.7  CL 105 104 104  CO2 21* 22 24  BUN 8 8 10   CREATININE 1.08* 1.08* 0.87  GLUCOSE 149* 131* 116*    Electrolytes  Recent Labs Lab 03/31/16 0510 04/01/16 0320 04/02/16 0349  CALCIUM 7.2* 7.2* 7.3*  MG 2.1 1.8 1.8  PHOS 3.4 2.1* 2.9    CBC  Recent Labs Lab  03/31/16 0510 04/01/16 0320 04/02/16 0349  WBC 20.5* 13.0* 9.4  HGB 9.6* 8.8* 8.4*  HCT 32.1* 28.8* 28.0*  PLT 448* 363 309    Coag's No results for input(s): APTT, INR in the last 168 hours.  Sepsis Markers  Recent Labs Lab 03/28/16 1722 03/29/16 2000 03/29/16 2320 03/31/16 1500 04/01/16 0320 04/02/16 0349  LATICACIDVEN 1.63 0.9 1.0  --   --   --   PROCALCITON  --   --   --  0.85 0.62 0.27    ABG  Recent Labs Lab 03/29/16 0320 03/30/16 0458  PHART 7.380 7.345*  PCO2ART 46.4* 41.9  PO2ART 135.0* 116.0*    Liver Enzymes  Recent Labs Lab 03/29/16 1411 03/30/16 0500 04/01/16 0320 04/02/16 0349  AST 25 20  --  17  ALT 18 16  --  11*  ALKPHOS 41 38  --  34*  BILITOT 1.0 0.9  --  0.4  ALBUMIN 2.1* 1.8* 1.2* 1.2*    Cardiac Enzymes No results for input(s): TROPONINI, PROBNP in the last 168 hours.  Glucose  Recent Labs Lab 03/31/16 2357 04/01/16 0621 04/01/16 1200 04/01/16 1833 04/02/16 0020 04/02/16 0603  GLUCAP 136* 131* 131* 124* 136* 113*    Imaging No results found.   STUDIES:  No new CXR  MICROBIOLOGY: Blood Ctx x2 4/9>>> Urine  Ctx 4/9>>> Tracheal Asp Ctx 4/9>>> Blood Ctx x2 4/5>>> Urine Ctx 4/5:  Multiple species present MRSA PCR 4/6:  Negative   ANTIBIOTICS: Cipro 4/5>>> Flagyl 4/5>>>  SIGNIFICANT EVENTS: 3/20 Exp lap appendectomy,removal sigmoid colon, bowel repair in setting diverticulitis. [facility] 4/6 Exp Lap , colostomy removal, partial colectomy,omentectom, wound debridement, open wound with wound vac.  LINES/TUBES: OETT 8.0 4/6>>> R PICC 4/6>>> L RADIAL ART LINE 4/7>>> FOLEY 4/6>>> NGT 4/6>>> WOUND VAC 4/6>>> PIV x2  ASSESSMENT / PLAN:  PULMONARY A: Acute Hypoxic Respiratory Failure H/O Asthma vs COPD Chronic Tobacco Use H/O OSA  P:   No weaning for now as she is going back to OR for wound closure Full Vent Support for now Albuterol neb tid  CARDIOVASCULAR A:  Septic Shock - Worsening. Volume  up. H/O HTN  P:  Levophed to maintain MAP >65. Wean as tolerated Vitals per unit protocol Monitor on telemetry  RENAL A:   Acute Renal Failure - Improving. UOP improving.  P:   Monitoring electrolytes & renal function daily Replacing electrolytes as needed  GASTROINTESTINAL A:     Post Exp lap with open wound, in discontinuity 4/6  P:   To OR today per [facility] TPN  HEMATOLOGIC A:   Anemia - Worsening but question dilution. No obvious active bleeding Leukocytosis - Improving. Likely secondary to sepsis.  P:  Follow CBC Lovenox [place] daily SCDs  INFECTIOUS A:   Sepsis Peritonitis  Ostomy infection / cellulitis  P:   Empiric Cipro & Flagyl Day #6 Follow repeat cultures Procalcitonin per algorithm - Improving.  ENDOCRINE A:   H/O Hypothyroidism   P:   Synthroid IV Resistant algorithm SSI Accu-Checks q6hr  NEUROLOGIC A:   Sedation on Ventilator Pain Control  P:   RASS goal: -2 Propofol gtt Fentanyl gtt & IV prn  FAMILY  - Updates: Family updated at bedside by Dr. Ashok Cordia 4/8. Nio family at bedside 4/10. - Inter-disciplinary family meet or Palliative Care meeting due by: 4/12  TODAY'S SUMMARY:  MO(300 lb) female , smoker, asthma , who had exp lap 3/30 at [facility] with colostomy and presented 4/5 feculent drainage and required exp lap with open wound. She will  return to OR today. Fever curve and procalcitonin are improving so we will continue the current abx without any change.   Critical care time- 35 mins  Marshell Garfinkel MD [facility] Pager [phone] If no answer or after 3pm call: [phone] 04/02/2016, 10:38 AM

## 2016-04-02 NOTE — Progress Notes (Signed)
Pharmacy Antibiotic Note  Christina Castaneda is a 60 y.o. female admitted on 03/28/2016 with bacteremia.  S/p OR for colostomy and wound vac.  Pharmacy has been consulted for vancomycin dosing.  Scr 0.87, est CrCl ~ 100 ml/min.  Wt = 143.7 kg  Plan: 1. Vancomycin 2500 mg x 1, then 2. Vancomycin 1500 mg IV q 12 hr. 3. F/u cultures and narrow as able.  Height: 5\' 6"  (167.6 cm) Weight: (!) 316 lb 12.8 oz (143.7 kg) IBW/kg (Calculated) : 59.3  Temp (24hrs), Avg:98 F (36.7 C), Min:97.9 F (36.6 C), Max:98.2 F (36.8 C)   Recent Labs Lab 03/28/16 1722 03/29/16 1411 03/29/16 2000 03/29/16 2320 03/30/16 0500 03/31/16 0510 04/01/16 0320 04/02/16 0349  WBC  --  16.5*  --   --  14.0* 20.5* 13.0* 9.4  CREATININE  --  1.35*  --   --  1.23* 1.08* 1.08* 0.87  LATICACIDVEN 1.63  --  0.9 1.0  --   --   --   --     Estimated Creatinine Clearance: 98.5 mL/min (by C-G formula based on Cr of 0.87).    Allergies  Allergen Reactions  . Penicillins Anaphylaxis, Hives, Shortness Of Breath, Swelling and Other (See Comments)    Angioedema    Antimicrobials this admission: Vancomycin >> 4/10 Cipro >> 4/6 > Flagyl 4/6 >  Dose adjustments this admission:   Microbiology results: 4/5 BCx : Neg 4/9 BCx: GPC in clusters 4/9 UCx: CNS  4/9 Sputum: pending  4/6 MRSA PCR: neg  Thank you for allowing pharmacy to be a part of this patient's care.  Uvaldo Rising, BCPS  Clinical Pharmacist Pager [phone]  04/02/2016 8:15 PM

## 2016-04-02 NOTE — Progress Notes (Addendum)
CRITICAL VALUE ALERT  Critical value received:  Anerobic Gram positive cocci  Date of notification:  04/02/16  Time of notification:  [id number]  Critical value read back:Yes.    Nurse who received alert:  Berenice Primas, RN  MD notified (1st page):  Dr. Vaughan Browner  Time of first page:  62  MD notified (2nd page):  Time of second page:  Responding MD:  Dr. Vaughan Browner  Time MD responded: [phone]

## 2016-04-02 NOTE — Transfer of Care (Signed)
Immediate Anesthesia Transfer of Care Note  Patient: Christina Castaneda  Procedure(s) Performed: Procedure(s): Re-exploration of open abdomen, application of abdominal wound vac (N/A) application of wound vac+ (N/A)  Patient Location: SICU  Anesthesia Type:General  Level of Consciousness: sedated and Patient remains intubated per anesthesia plan  Airway & Oxygen Therapy: Patient remains intubated per anesthesia plan and Patient placed on Ventilator (see vital sign flow sheet for setting)  Post-op Assessment: Report given to RN and Post -op Vital signs reviewed and stable  Post vital signs: Reviewed and stable  Last Vitals:  Filed Vitals:   04/02/16 1218 04/02/16 1300  BP:    Pulse:  60  Temp: 36.8 C   Resp:  16    Complications: No apparent anesthesia complications   Pt tx from OR to ICU with standard monitors (HR, BP, SPO2). Emergency drugs and equipment available. VSS. Pt VSS during transport to ICU. Pt met my ICU RN and RT. Report given to RN/RT and all questions answered. Pt connected to ICU monitors and continued stability confirmed.

## 2016-04-02 NOTE — Progress Notes (Signed)
PARENTERAL NUTRITION CONSULT NOTE - Follow Up  Pharmacy Consult for TPN Indication: Intolerance to enteral feeds (Expected prolonged ileus)  Allergies  Allergen Reactions  . Penicillins Anaphylaxis, Hives, Shortness Of Breath, Swelling and Other (See Comments)    Angioedema   Patient Measurements: Height: '5\' 6"'  (167.6 cm) Weight: (!) 316 lb 12.8 oz (143.7 kg) IBW/kg (Calculated) : 59.3  IBW: ~59.3kg Adjusted Body Weight: 82.4kg  Vital Signs: Temp: 97.9 F (36.6 C) (04/10 0418) Temp Source: Axillary (04/10 0418) BP: 135/58 mmHg (04/09 2329) Pulse Rate: 55 (04/10 0700) Intake/Output from previous day: 04/09 0701 - 04/10 0700 In: 5045.3 [I.V.:2635.9; NG/GT:90; IV Piggyback:650; TPN:1669.4] Out: 3770 [Urine:2820; Emesis/NG output:250; Drains:700] Intake/Output from this shift: Total I/O In: 30 [NG/GT:30] Out: -  Labs:  Recent Labs  03/31/16 0510 04/01/16 0320 04/02/16 0349  WBC 20.5* 13.0* 9.4  HGB 9.6* 8.8* 8.4*  HCT 32.1* 28.8* 28.0*  PLT 448* 363 309    Recent Labs  03/31/16 0510 04/01/16 0320 04/02/16 0349  NA 132* 133* 133*  K 4.8 4.8 4.7  CL 105 104 104  CO2 21* 22 24  GLUCOSE 149* 131* 116*  BUN '8 8 10  ' CREATININE 1.08* 1.08* 0.87  CALCIUM 7.2* 7.2* 7.3*  MG 2.1 1.8 1.8  PHOS 3.4 2.1* 2.9  PROT  --   --  4.4*  ALBUMIN  --  1.2* 1.2*  AST  --   --  17  ALT  --   --  11*  ALKPHOS  --   --  34*  BILITOT  --   --  0.4  PREALBUMIN  --   --  3.2*  TRIG  --  48 50   Estimated Creatinine Clearance: 98.5 mL/min (by C-G formula based on Cr of 0.87).   Recent Labs  04/01/16 1833 04/02/16 0020 04/02/16 0603  GLUCAP 124* 136* 113*   Medical History: Past Medical History  Diagnosis Date  . Goiter   . Hypothyroidism   . Asthma     mild  . Hypertension   . Atypical endometrial hyperplasia   . Heart murmur   . Vitamin B12 deficiency   . Yeast infection     for last 3 weeks  . Gout   . COPD (chronic obstructive pulmonary disease) (HCC)     Insulin Requirements in the past 24 hours:  9 units of SSI resistant  Admission: 32 yof 2 weeks out from emergency colectomy/colostomy. Discharged 4/2, feculent discharge noted from midline wound when having staples removed. Noted to have necrotic stoma w/ peritonitis, fascial necrosis. Pharmacy consulted to start TPN due to expected prolonged ileus.   Assessment: 4-7 Patient in the surgical ICU post-op on 4/6. No muscle or subcutaneous fat depletion noticed per RD assessment. 4-9 Triglycerides 48 on propofol this AM  Surgeries/Procedures: ~2 wks pta: emergency colectomy/colostomy 4/6: colostomy removal and end stapling of stoma, partial colectomy, ex-lap, omentectomy, wound debridement 4/7: lap, VAC change, possible debridement  GI: s/p Hartmann's procedure ~2wks ago, POD#4 here. Pre-alb down to 3.2. Plan to return back to OR today for lap, VAC change, and possible debridement. NG output down to 218m yesterday. Drain output is stable at 65164m Endo: No DM hx. CBGs 110-130s on SSI. hypothyroidism - synthroid Lytes: wnl exc Na 133, K 4.7 (goal >/=4 w/ ileus), coCa~9.6, Mag remains at 1.8 (goal >/=2 w/ ileus) after replacement yesterday, phos up to 2.9 after replacement  Renal: SCr down to wnl, UOP improved to 0.64m85mg/hr. NS with 20 of KCl  at 55m/hr. Pulm: COPD. Vent post-op - back to OR 4/7. CXR - improved pleural effusion/aeration Cards: HR ok and BR soft. NE gtt Hepatobil: LFTs/alk phos/tbili wnl, TG down to 50 Neuro: sedated on propofol/fent gtts ID: Day #5 of Cipro/Flagyl for intra-abdominal infxn. Afebrile, WBC down to wnl Best Practices: scds, lovenox ppx, ppi iv  TPN Access: PICC 03/29/16>> TPN start date: 03/29/16  Current Nutrition:  NPO Clinimix E 5/15 at 95 ml/h  Propofol gtt - at 549m/kg/hr (~100 kcal per day)  Nutritional Goals: (per RD recs 4/6) Kcal: 142567-2091Cal,  Protein: 145-155 g  Plan:  Increase Clinimix E 5/15 to 11073mr tonight Propofol at  5mc31mg/min (4.1ml/20m ~100 kcal) Hold 20% lipid emulsion for first 7 days for ICU patients per ASPEN guidelines (Start date 4/6)  Decrease NS with 20 KCl to 50 ml/hr TPN and propofol provides 132 g of protein and 1974 kCals per day meeting 90% of protein and 100% of kCal needs We will not be able to meet protein and kcal needs with Clinimix Continue MVI and TE in TPN Continue moderate SSI and adjust as needed Monitor TPN labs, daily Mg  Give Mg 3g IV x 1 today  NathaElenor QuinonesrmD, BCPS Clinical Pharmacist Pager [phone] 8:16 AM

## 2016-04-02 NOTE — Progress Notes (Signed)
RT note- Patient back from OR, placed bck to full support

## 2016-04-03 ENCOUNTER — Encounter (HOSPITAL_COMMUNITY): Payer: Self-pay | Admitting: General Surgery

## 2016-04-03 LAB — GLUCOSE, CAPILLARY
GLUCOSE-CAPILLARY: 109 mg/dL — AB (ref 65–99)
Glucose-Capillary: 100 mg/dL — ABNORMAL HIGH (ref 65–99)
Glucose-Capillary: 104 mg/dL — ABNORMAL HIGH (ref 65–99)
Glucose-Capillary: 109 mg/dL — ABNORMAL HIGH (ref 65–99)
Glucose-Capillary: 128 mg/dL — ABNORMAL HIGH (ref 65–99)
Glucose-Capillary: 134 mg/dL — ABNORMAL HIGH (ref 65–99)

## 2016-04-03 LAB — CBC WITH DIFFERENTIAL/PLATELET
BASOS ABS: 0 10*3/uL (ref 0.0–0.1)
BASOS PCT: 0 %
EOS ABS: 0.1 10*3/uL (ref 0.0–0.7)
Eosinophils Relative: 1 %
HCT: 26.8 % — ABNORMAL LOW (ref 36.0–46.0)
HEMOGLOBIN: 8.3 g/dL — AB (ref 12.0–15.0)
LYMPHS PCT: 10 %
Lymphs Abs: 1.4 10*3/uL (ref 0.7–4.0)
MCH: 29.2 pg (ref 26.0–34.0)
MCHC: 31 g/dL (ref 30.0–36.0)
MCV: 94.4 fL (ref 78.0–100.0)
MONO ABS: 1.6 10*3/uL — AB (ref 0.1–1.0)
Monocytes Relative: 12 %
NEUTROS PCT: 77 %
Neutro Abs: 10.4 10*3/uL — ABNORMAL HIGH (ref 1.7–7.7)
PLATELETS: 325 10*3/uL (ref 150–400)
RBC: 2.84 MIL/uL — ABNORMAL LOW (ref 3.87–5.11)
RDW: 16.1 % — ABNORMAL HIGH (ref 11.5–15.5)
WBC MORPHOLOGY: INCREASED
WBC: 13.5 10*3/uL — ABNORMAL HIGH (ref 4.0–10.5)

## 2016-04-03 LAB — MAGNESIUM: MAGNESIUM: 2 mg/dL (ref 1.7–2.4)

## 2016-04-03 LAB — RENAL FUNCTION PANEL
ALBUMIN: 1.1 g/dL — AB (ref 3.5–5.0)
ANION GAP: 6 (ref 5–15)
BUN: 15 mg/dL (ref 6–20)
CALCIUM: 7.2 mg/dL — AB (ref 8.9–10.3)
CO2: 22 mmol/L (ref 22–32)
Chloride: 103 mmol/L (ref 101–111)
Creatinine, Ser: 0.81 mg/dL (ref 0.44–1.00)
Glucose, Bld: 134 mg/dL — ABNORMAL HIGH (ref 65–99)
PHOSPHORUS: 2.6 mg/dL (ref 2.5–4.6)
POTASSIUM: 5 mmol/L (ref 3.5–5.1)
SODIUM: 131 mmol/L — AB (ref 135–145)

## 2016-04-03 LAB — CULTURE, RESPIRATORY

## 2016-04-03 LAB — URINE CULTURE
Culture: 30000 — AB
Special Requests: NORMAL

## 2016-04-03 LAB — CULTURE, RESPIRATORY W GRAM STAIN: Special Requests: NORMAL

## 2016-04-03 MED ORDER — TRACE MINERALS CR-CU-MN-SE-ZN 10-1000-500-60 MCG/ML IV SOLN
INTRAVENOUS | Status: AC
  Administered 2016-04-03: 18:00:00 via INTRAVENOUS
  Filled 2016-04-03: qty 2640

## 2016-04-03 MED ORDER — SODIUM CHLORIDE 0.9 % IV SOLN
INTRAVENOUS | Status: DC
  Administered 2016-04-05: 10 mL via INTRAVENOUS
  Administered 2016-04-07: 07:00:00 via INTRAVENOUS
  Administered 2016-04-13: 10 mL via INTRAVENOUS
  Administered 2016-04-18: 01:00:00 via INTRAVENOUS
  Administered 2016-04-21 – 2016-04-22 (×2): 10 mL/h via INTRAVENOUS
  Administered 2016-04-23 – 2016-04-28 (×4): via INTRAVENOUS

## 2016-04-03 NOTE — Progress Notes (Signed)
Elink notified of pt HR dipping into 40's (45-50) intermittently. Will continue to monitor closely.  Eleonore Chiquito RN 2 [place]

## 2016-04-03 NOTE — Plan of Care (Signed)
Problem: Activity: Goal: Risk for activity intolerance will decrease Outcome: Progressing Unable to progress mobility d/t unstable condition. Doing AROM w/pt as tolerated throughout day.  Problem: Urinary Elimination: Goal: Will remain free from infection Outcome: Progressing Addressing current infections w/antibiotics

## 2016-04-03 NOTE — Progress Notes (Signed)
PARENTERAL NUTRITION CONSULT NOTE - Follow Up  Pharmacy Consult for TPN Indication: Intolerance to enteral feeds (Expected prolonged ileus)  Allergies  Allergen Reactions  . Penicillins Anaphylaxis, Hives, Shortness Of Breath, Swelling and Other (See Comments)    Angioedema   Patient Measurements: Height: '5\' 6"'  (167.6 cm) Weight: (!) 330 lb 0.5 oz (149.7 kg) IBW/kg (Calculated) : 59.3  IBW: ~59.3kg Adjusted Body Weight: 82.4kg  Vital Signs: Temp: 99 F (37.2 C) (04/11 0407) Temp Source: Axillary (04/11 0407) BP: 86/52 mmHg (04/10 1952) Pulse Rate: 66 (04/11 0715) Intake/Output from previous day: 04/10 0701 - 04/11 0700 In: 4033.1 [I.V.:2173.1; NG/GT:60; IV Piggyback:900; TPN:900] Out: 2265 [Urine:1605; Emesis/NG output:50; Drains:600; Blood:10] Intake/Output from this shift:   Labs:  Recent Labs  04/01/16 0320 04/02/16 0349 04/03/16 0438  WBC 13.0* 9.4 13.5*  HGB 8.8* 8.4* 8.3*  HCT 28.8* 28.0* 26.8*  PLT 363 309 325    Recent Labs  04/01/16 0320 04/02/16 0349 04/03/16 0438  NA 133* 133* 131*  K 4.8 4.7 5.0  CL 104 104 103  CO2 '22 24 22  ' GLUCOSE 131* 116* 134*  BUN '8 10 15  ' CREATININE 1.08* 0.87 0.81  CALCIUM 7.2* 7.3* 7.2*  MG 1.8 1.8 2.0  PHOS 2.1* 2.9 2.6  PROT  --  4.4*  --   ALBUMIN 1.2* 1.2* 1.1*  AST  --  17  --   ALT  --  11*  --   ALKPHOS  --  34*  --   BILITOT  --  0.4  --   PREALBUMIN  --  3.2*  --   TRIG 48 50  --    Estimated Creatinine Clearance: 108.6 mL/min (by C-G formula based on Cr of 0.81).   Recent Labs  04/02/16 1835 04/02/16 2338 04/03/16 0615  GLUCAP 104* 100* 109*   Medical History: Past Medical History  Diagnosis Date  . Goiter   . Hypothyroidism   . Asthma     mild  . Hypertension   . Atypical endometrial hyperplasia   . Heart murmur   . Vitamin B12 deficiency   . Yeast infection     for last 3 weeks  . Gout   . COPD (chronic obstructive pulmonary disease) (HCC)    Insulin Requirements in the past 24  hours:  3 units of SSI resistant  Admission: 72 yof 2 weeks out from emergency colectomy/colostomy. Discharged 4/2, feculent discharge noted from midline wound when having staples removed. Noted to have necrotic stoma w/ peritonitis, fascial necrosis. Pharmacy consulted to start TPN due to expected prolonged ileus.   Assessment: 4-7 Patient in the surgical ICU post-op on 4/6. No muscle or subcutaneous fat depletion noticed per RD assessment. 4-9 Triglycerides 48 on propofol this AM  Surgeries/Procedures: ~2 wks pta: emergency colectomy/colostomy 4/6: colostomy removal and end stapling of stoma, partial colectomy, ex-lap, omentectomy, wound debridement 4/7: lap, VAC change, possible debridement 4/10: Re-exploration of abdomen, Wound VAC change  GI: s/p Hartmann's procedure ~2wks ago, POD#4 here. Pre-alb down to 3.2. Albumin is low at 1.1. Reutned back to OR on 4/10 for re-ex lap and VAC change. NG output down to 161m yesterday. Drain output is stable at 6564m Endo: No DM hx. CBGs 100-140s on SSI. hypothyroidism - synthroid Lytes: wnl exc Na 131, K 5.0 (goal >/=4 w/ ileus), coCa~9.6, Mag up to 2.0 after replacement yesterday (goal >/=2 w/ ileus) phos ok at 2.6. Renal: SCr down to wnl, UOPok at 0.56m64mg/hr. NS with 20 of KCl at  83m/hr. Pulm: COPD. Vent post-op - back to OR 4/7. CXR - improved pleural effusion/aeration Cards: HR ok and BR soft. NE gtt Hepatobil: LFTs/alk phos/tbili wnl, TG down to 50 Neuro: sedated on propofol/fent gtts ID: Day #6 of Cipro/Flagyl for intra-abdominal infxn. Afebrile, WBC up to 13.5. Best Practices: scds, lovenox ppx, ppi iv  TPN Access: PICC 03/29/16>> TPN start date: 03/29/16  Current Nutrition:  NPO Clinimix E 5/15 at 110 ml/hr Propofol gtt at 133m/kg/hr (216 kcal per day)  Nutritional Goals: (per RD recs 4/10) Kcal: 142241-1464Cal,  Protein: 145-155 g  Plan:  Continue Clinimix E 5/15 to 11058mr tonight Propofol at 32m59mg/min (8.2ml/31m 216  kcal) Hold 20% lipid emulsion for first 7 days for ICU patients per ASPEN guidelines (Start date 4/6)  Switch NS with KCl to plain NS at 50ml/47mue to rising K TPN and propofol provides 132 g of protein and 2090 kCals per day meeting 90% of protein and > 100% of kCal needs Will not be able to meet protein and kcal needs with Clinimix Continue MVI and TE in TPN Continue moderate SSI and adjust as needed Monitor TPN labs, K closely  NathanElenor QuinonesmD, BCPS Clinical Pharmacist Pager [phone] 7:34 AM

## 2016-04-03 NOTE — Progress Notes (Signed)
1 Day Post-Op  Subjective: intubated  Objective: Vital signs in last 24 hours: Temp:  [98.2 F (36.8 C)-99 F (37.2 C)] 98.5 F (36.9 C) (04/11 1202) Pulse Rate:  [47-96] 59 (04/11 1700) Resp:  [13-20] 16 (04/11 1700) BP: (86-125)/(45-64) 125/47 mmHg (04/11 1459) SpO2:  [95 %-100 %] 95 % (04/11 1700) FiO2 (%):  [40 %] 40 % (04/11 1600) Weight:  [149.7 kg (330 lb 0.5 oz)] 149.7 kg (330 lb 0.5 oz) (04/11 0500) Last BM Date:  (Pt intubated. Unknown)  Intake/Output from previous day: 04/10 0701 - 04/11 0700 In: 4033.1 [I.V.:2173.1; NG/GT:60; IV Piggyback:900; TPN:900] Out: 2265 [Urine:1605; Emesis/NG output:50; Drains:600; Blood:10] Intake/Output this shift: Total I/O In: 1247.2 [I.V.:947.2; IV Piggyback:300] Out: 1180 [Urine:1180]  Intubated, OE to voice, FC Obese, ostomy viable, no ostomy function. Open abd - wound vac intact - serosang   Lab Results:   Recent Labs  04/02/16 0349 04/03/16 0438  WBC 9.4 13.5*  HGB 8.4* 8.3*  HCT 28.0* 26.8*  PLT 309 325   BMET  Recent Labs  04/02/16 0349 04/03/16 0438  NA 133* 131*  K 4.7 5.0  CL 104 103  CO2 24 22  GLUCOSE 116* 134*  BUN 10 15  CREATININE 0.87 0.81  CALCIUM 7.3* 7.2*   PT/INR No results for input(s): LABPROT, INR in the last 72 hours. ABG No results for input(s): PHART, HCO3 in the last 72 hours.  Invalid input(s): PCO2, PO2  Studies/Results: No results found.  Anti-infectives: Anti-infectives    Start     Dose/Rate Route Frequency Ordered Stop   04/03/16 2100  vancomycin (VANCOCIN) 1,500 mg in sodium chloride 0.9 % 500 mL IVPB     1,500 mg 250 mL/hr over 120 Minutes Intravenous Every 12 hours 04/02/16 2020     04/02/16 2030  vancomycin (VANCOCIN) 2,500 mg in sodium chloride 0.9 % 500 mL IVPB     2,500 mg 250 mL/hr over 120 Minutes Intravenous  Once 04/02/16 2020 04/02/16 2306   03/29/16 1200  ciprofloxacin (CIPRO) IVPB 400 mg     400 mg 200 mL/hr over 60 Minutes Intravenous Every 12 hours  03/29/16 0213     03/29/16 0600  [MAR Hold]  ciprofloxacin (CIPRO) IVPB 400 mg     (MAR Hold since 03/28/16 2357)   400 mg 200 mL/hr over 60 Minutes Intravenous On call to O.R. 03/28/16 2306 03/28/16 2355   03/29/16 0600  [MAR Hold]  metroNIDAZOLE (FLAGYL) IVPB 500 mg     (MAR Hold since 03/28/16 2357)   500 mg 100 mL/hr over 60 Minutes Intravenous On call to O.R. 03/28/16 2306 03/29/16 0025   03/29/16 0600  metroNIDAZOLE (FLAGYL) IVPB 500 mg     500 mg 100 mL/hr over 60 Minutes Intravenous Every 8 hours 03/29/16 0213     03/28/16 2300  ertapenem (INVANZ) 1 g in sodium chloride 0.9 % 50 mL IVPB  Status:  Discontinued     1 g 100 mL/hr over 30 Minutes Intravenous  Once 03/28/16 2259 03/28/16 2306      Assessment/Plan: s/p Procedure(s): Re-exploration of open abdomen, application of abdominal wound vac (N/A) application of wound vac+ (N/A)  Long discussion with pt's husband regarding open abdomen situation. I really only see 2 decent options. Pt has large separation of fascia that currently can't be closed primarily Option 1 - bridge defect with biologic mesh and cover mesh with skin. Adv - should be able to get off ventilator soon, disadv - 100% chance of forming  rather large incisional hernia which would complicate future procedures, this is assuming biologic mesh doesn't disintegrate or she dehisces wound   Option 2 - apply dynamic abdominal wall closure device (ABRA). Husband shown video. This would allow gradual bedside reapproximation of fascia with eventual trip to OR for closure. I still believe even with ABRA that she will prob form incisional hernia (morbid obesity, PCMN, state of muscle) but I believe she would have less risk of loss of abdominal domain with this route. Disadv - would be on ventilator longer, skin ulcers   O/w risks are similar - bleeding, injury surrounding tissues, fistula formation, dehiscence (biologic > ABRA), hernia, ileus, skin breakdown/ulcer, blood clot,  cardiac -pulmonary events.   After much discussion, husband has decided to go with placement of abdominal wall closure system (ABRA).   Assuming no delay in arrival of closure system from vendor, will plan on OR first thing in AM  Appreciate CCM assistance  Leighton Ruff. Redmond Pulling, MD, FACS General, Bariatric, & Minimally Invasive Surgery [facility], [place]   LOS: 6 days    Gayland Curry 04/03/2016

## 2016-04-03 NOTE — Progress Notes (Signed)
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 She has a PMH of OSA, COPD, Smoker, MO (300 lbs) and will remain on vent till abd wound stabilized. PCCM asked to manage vent.  SUBJECTIVE: To OR yesterday. Still with open abdomen  REVIEW OF SYSTEMS:  Unable to obtain given intubation & sedation.   VITAL SIGNS: BP 96/48 mmHg  Pulse 68  Temp(Src) 98.2 F (36.8 C) (Axillary)  Resp 14  Ht 5\' 6"  (1.676 m)  Wt 330 lb 0.5 oz (149.7 kg)  BMI 53.29 kg/m2  SpO2 95%  LMP 11/18/2012  HEMODYNAMICS: CVP:  [9 mmHg-12 mmHg] 12 mmHg  VENTILATOR SETTINGS: Vent Mode:  [-] PRVC FiO2 (%):  [40 %] 40 % Set Rate:  [16 bmp] 16 bmp Vt Set:  [600 mL] 600 mL PEEP:  [5 cmH20] 5 cmH20 Plateau Pressure:  [18 cmH20-20 cmH20] 19 cmH20  INTAKE / OUTPUT: I/O last 3 completed shifts: In: 6781.5 [I.V.:3451.5; NG/GT:90; IV Piggyback:1200] Out: 32 [Urine:2575; Emesis/NG output:200; Drains:1000; Blood:10]  PHYSICAL EXAMINATION: General:  No distress. Obeys commands Neuro:  Moves all ext. No focal deficits.  HEENT:  ETT in place, no thyormegaly, JVD Cardiovascular:  RRR, no MRG Lungs: Clear. No wheeze, crackles Abdomen:  Soft, Wound vac in place. Integument:  Warm & dry. No rash.  LABS:  BMET  Recent Labs Lab 04/01/16 0320 04/02/16 0349 04/03/16 0438  NA 133* 133* 131*  K 4.8 4.7 5.0  CL 104 104 103  CO2 22 24 22   BUN 8 10 15   CREATININE 1.08* 0.87 0.81  GLUCOSE 131* 116* 134*    Electrolytes  Recent Labs Lab 04/01/16 0320 04/02/16 0349 04/03/16 0438  CALCIUM 7.2* 7.3* 7.2*  MG 1.8 1.8 2.0  PHOS 2.1* 2.9 2.6    CBC  Recent Labs Lab  04/01/16 0320 04/02/16 0349 04/03/16 0438  WBC 13.0* 9.4 13.5*  HGB 8.8* 8.4* 8.3*  HCT 28.8* 28.0* 26.8*  PLT 363 309 325    Coag's No results for input(s): APTT, INR in the last 168 hours.  Sepsis Markers  Recent Labs Lab 03/28/16 1722 03/29/16 2000 03/29/16 2320 03/31/16 1500 04/01/16 0320 04/02/16 0349  LATICACIDVEN 1.63 0.9 1.0  --   --   --   PROCALCITON  --   --   --  0.85 0.62 0.27    ABG  Recent Labs Lab 03/29/16 0320 03/30/16 0458  PHART 7.380 7.345*  PCO2ART 46.4* 41.9  PO2ART 135.0* 116.0*    Liver Enzymes  Recent Labs Lab 03/29/16 1411 03/30/16 0500 04/01/16 0320 04/02/16 0349 04/03/16 0438  AST 25 20  --  17  --   ALT 18 16  --  11*  --   ALKPHOS 41 38  --  34*  --   BILITOT 1.0 0.9  --  0.4  --   ALBUMIN 2.1* 1.8* 1.2* 1.2* 1.1*    Cardiac Enzymes No results for input(s): TROPONINI, PROBNP in the last 168 hours.  Glucose  Recent Labs Lab 04/02/16 0603 04/02/16 1216 04/02/16 1825 04/02/16 1835 04/02/16 2338 04/03/16 0615  GLUCAP 113* 140* >600* 104* 100* 109*    Imaging No  results found.   STUDIES:  No new CXR  MICROBIOLOGY: Blood Ctx x2 4/9>>> GPCs Urine Ctx 4/9>>> Coag neg staph Tracheal Asp Ctx 4/9>>> Blood Ctx x2 4/5>>> Urine Ctx 4/5:  Multiple species present MRSA PCR 4/6:  Negative   ANTIBIOTICS: Cipro 4/5>>> Flagyl 4/5>>> Vanco 4/11 >>  SIGNIFICANT EVENTS: 3/20 Exp lap appendectomy,removal sigmoid colon, bowel repair in setting diverticulitis. [facility] 4/6 Exp Lap , colostomy removal, partial colectomy,omentectom, wound debridement, open wound with wound vac.  LINES/TUBES: OETT 8.0 4/6>>> R PICC 4/6>>> L RADIAL ART LINE 4/7>>> FOLEY 4/6>>> NGT 4/6>>> WOUND VAC 4/6>>> PIV x2  ASSESSMENT / PLAN:  PULMONARY A: Acute Hypoxic Respiratory Failure H/O Asthma vs COPD Chronic Tobacco Use H/O OSA  P:   No weaning for now until abd is closed.  Full Vent Support for now Albuterol neb  tid  CARDIOVASCULAR A:  Septic Shock - Worsening. Volume up. H/O HTN  P:  Levophed to maintain MAP >65. Wean as tolerated Vitals per unit protocol Monitor on telemetry  RENAL A:   Acute Renal Failure - Improving. UOP improving.  P:   Monitoring electrolytes & renal function daily Replacing electrolytes as needed  GASTROINTESTINAL A:     Post Exp lap with open wound, in discontinuity 4/6  P:   TPN PPI for SUP  HEMATOLOGIC A:   Anemia - Worsening but question dilution. No obvious active bleeding Leukocytosis - Improving. Likely secondary to sepsis.  P:  Follow CBC Lovenox [place] daily SCDs  INFECTIOUS A:   Sepsis Peritonitis  Ostomy infection / cellulitis  P:   Empiric Cipro & Flagyl Day #7 On vanco form GPCs Follow repeat cultures Procalcitonin per algorithm - Improving.  ENDOCRINE A:   H/O Hypothyroidism   P:   Synthroid IV Resistant algorithm SSI Accu-Checks q6hr  NEUROLOGIC A:   Sedation on Ventilator Pain Control  P:   RASS goal: -2 Propofol gtt Fentanyl gtt & IV prn  FAMILY  - Updates: Son updated 4/11. - Inter-disciplinary family meet or Palliative Care meeting due by: 4/12  TODAY'S SUMMARY:  MO(300 lb) female , smoker, asthma , who had exp lap 3/30 at [facility] with colostomy and presented 4/5 feculent drainage and required exp lap with open wound. Fever curve and procalcitonin are improving.  Critical care time- 35 mins  Marshell Garfinkel MD [place] [facility] Pager [phone] If no answer or after 3pm call: [phone] 04/03/2016, 9:25 AM

## 2016-04-04 ENCOUNTER — Inpatient Hospital Stay (HOSPITAL_COMMUNITY): Payer: BLUE CROSS/BLUE SHIELD | Admitting: Anesthesiology

## 2016-04-04 ENCOUNTER — Encounter (HOSPITAL_COMMUNITY): Payer: Self-pay | Admitting: Certified Registered Nurse Anesthetist

## 2016-04-04 ENCOUNTER — Encounter (HOSPITAL_COMMUNITY): Admission: EM | Disposition: A | Discharge status: Rehabilitation Facility | Payer: Self-pay | Source: Home / Self Care

## 2016-04-04 HISTORY — PX: LAPAROTOMY: SHX154

## 2016-04-04 LAB — TRIGLYCERIDES: Triglycerides: 49 mg/dL (ref <150)

## 2016-04-04 LAB — RENAL FUNCTION PANEL
Albumin: 1.2 g/dL — ABNORMAL LOW (ref 3.5–5.0)
Anion gap: 5 (ref 5–15)
BUN: 16 mg/dL (ref 6–20)
CHLORIDE: 105 mmol/L (ref 101–111)
CO2: 23 mmol/L (ref 22–32)
CREATININE: 0.81 mg/dL (ref 0.44–1.00)
Calcium: 7.2 mg/dL — ABNORMAL LOW (ref 8.9–10.3)
GFR calc Af Amer: >60 mL/min (ref >60)
GFR calc non Af Amer: >60 mL/min (ref >60)
GLUCOSE: 147 mg/dL — AB (ref 65–99)
Phosphorus: 2.9 mg/dL (ref 2.5–4.6)
Potassium: 4.6 mmol/L (ref 3.5–5.1)
Sodium: 133 mmol/L — ABNORMAL LOW (ref 135–145)

## 2016-04-04 LAB — CBC WITH DIFFERENTIAL/PLATELET
BAND NEUTROPHILS: 10 %
BASOS PCT: 0 %
Basophils Absolute: 0 10*3/uL (ref 0.0–0.1)
Blasts: 0 %
EOS PCT: 0 %
Eosinophils Absolute: 0 10*3/uL (ref 0.0–0.7)
HCT: 26.3 % — ABNORMAL LOW (ref 36.0–46.0)
Hemoglobin: 8 g/dL — ABNORMAL LOW (ref 12.0–15.0)
LYMPHS ABS: 2.2 10*3/uL (ref 0.7–4.0)
LYMPHS PCT: 22 %
MCH: 28.8 pg (ref 26.0–34.0)
MCHC: 30.4 g/dL (ref 30.0–36.0)
MCV: 94.6 fL (ref 78.0–100.0)
MONO ABS: 0.5 10*3/uL (ref 0.1–1.0)
Metamyelocytes Relative: 0 %
Monocytes Relative: 5 %
Myelocytes: 0 %
NEUTROS PCT: 63 %
NRBC: 0 /100{WBCs}
Neutro Abs: 7.3 10*3/uL (ref 1.7–7.7)
OTHER: 0 %
PLATELETS: 301 10*3/uL (ref 150–400)
Promyelocytes Absolute: 0 %
RBC: 2.78 MIL/uL — ABNORMAL LOW (ref 3.87–5.11)
RDW: 16 % — AB (ref 11.5–15.5)
WBC: 10 10*3/uL (ref 4.0–10.5)

## 2016-04-04 LAB — GLUCOSE, CAPILLARY
GLUCOSE-CAPILLARY: 90 mg/dL (ref 65–99)
Glucose-Capillary: 126 mg/dL — ABNORMAL HIGH (ref 65–99)
Glucose-Capillary: 116 mg/dL — ABNORMAL HIGH (ref 65–99)
Glucose-Capillary: 102 mg/dL — ABNORMAL HIGH (ref 65–99)
Glucose-Capillary: 91 mg/dL (ref 65–99)

## 2016-04-04 LAB — CULTURE, BLOOD (SINGLE)

## 2016-04-04 LAB — MAGNESIUM: MAGNESIUM: 1.9 mg/dL (ref 1.7–2.4)

## 2016-04-04 SURGERY — LAPAROTOMY, EXPLORATORY
Anesthesia: General | Site: Abdomen

## 2016-04-04 MED ORDER — VECURONIUM BROMIDE 10 MG IV SOLR
INTRAVENOUS | Status: AC
  Filled 2016-04-04: qty 10

## 2016-04-04 MED ORDER — PROPOFOL 1000 MG/100ML IV EMUL
5.0000 ug/kg/min | INTRAVENOUS | Status: DC
  Administered 2016-04-04: 10 ug/kg/min via INTRAVENOUS
  Filled 2016-04-04: qty 100

## 2016-04-04 MED ORDER — VECURONIUM BROMIDE 10 MG IV SOLR
INTRAVENOUS | Status: DC | PRN
  Administered 2016-04-04 (×2): 5 mg via INTRAVENOUS

## 2016-04-04 MED ORDER — STERILE WATER FOR INJECTION IJ SOLN
INTRAMUSCULAR | Status: AC
  Filled 2016-04-04: qty 10

## 2016-04-04 MED ORDER — PHENYLEPHRINE HCL 10 MG/ML IJ SOLN
INTRAMUSCULAR | Status: DC | PRN
  Administered 2016-04-04: 80 ug via INTRAVENOUS

## 2016-04-04 MED ORDER — MIDAZOLAM HCL 5 MG/5ML IJ SOLN
INTRAMUSCULAR | Status: DC | PRN
  Administered 2016-04-04: 2 mg via INTRAVENOUS

## 2016-04-04 MED ORDER — MAGNESIUM SULFATE 2 GM/50ML IV SOLN
2.0000 g | Freq: Once | INTRAVENOUS | Status: AC
  Administered 2016-04-04: 2 g via INTRAVENOUS
  Filled 2016-04-04: qty 50

## 2016-04-04 MED ORDER — TRACE MINERALS CR-CU-MN-SE-ZN 10-1000-500-60 MCG/ML IV SOLN
INTRAVENOUS | Status: AC
  Administered 2016-04-04: 18:00:00 via INTRAVENOUS
  Filled 2016-04-04: qty 2640

## 2016-04-04 MED ORDER — HYDROMORPHONE HCL 1 MG/ML IJ SOLN
INTRAMUSCULAR | Status: AC
  Filled 2016-04-04: qty 1

## 2016-04-04 MED ORDER — LACTATED RINGERS IV SOLN
INTRAVENOUS | Status: DC | PRN
  Administered 2016-04-04: 08:00:00 via INTRAVENOUS

## 2016-04-04 MED ORDER — VITAL HIGH PROTEIN PO LIQD
1000.0000 mL | ORAL | Status: DC
  Administered 2016-04-05 – 2016-04-06 (×2): 1000 mL

## 2016-04-04 MED ORDER — ROCURONIUM BROMIDE 50 MG/5ML IV SOLN
INTRAVENOUS | Status: AC
  Filled 2016-04-04: qty 1

## 2016-04-04 MED ORDER — MIDAZOLAM HCL 2 MG/2ML IJ SOLN
1.0000 mg | INTRAMUSCULAR | Status: DC | PRN
  Administered 2016-04-04 (×3): 1 mg via INTRAVENOUS
  Filled 2016-04-04 (×3): qty 2

## 2016-04-04 MED ORDER — FENTANYL CITRATE (PF) 250 MCG/5ML IJ SOLN
INTRAMUSCULAR | Status: AC
  Filled 2016-04-04: qty 5

## 2016-04-04 MED ORDER — 0.9 % SODIUM CHLORIDE (POUR BTL) OPTIME
TOPICAL | Status: DC | PRN
  Administered 2016-04-04: 2000 mL

## 2016-04-04 MED ORDER — VECURONIUM BROMIDE 10 MG IV SOLR
INTRAVENOUS | Status: AC
Start: 2016-04-04 — End: 2016-04-04
  Filled 2016-04-04: qty 10

## 2016-04-04 MED ORDER — LIDOCAINE HCL (CARDIAC) 20 MG/ML IV SOLN
INTRAVENOUS | Status: AC
  Filled 2016-04-04: qty 5

## 2016-04-04 MED ORDER — PROPOFOL 10 MG/ML IV BOLUS
INTRAVENOUS | Status: AC
  Filled 2016-04-04: qty 20

## 2016-04-04 MED ORDER — HYDROMORPHONE HCL 1 MG/ML IJ SOLN
INTRAMUSCULAR | Status: DC | PRN
  Administered 2016-04-04 (×2): 0.5 mg via INTRAVENOUS

## 2016-04-04 MED ORDER — MIDAZOLAM HCL 2 MG/2ML IJ SOLN
INTRAMUSCULAR | Status: AC
  Filled 2016-04-04: qty 2

## 2016-04-04 MED ORDER — PHENYLEPHRINE 40 MCG/ML (10ML) SYRINGE FOR IV PUSH (FOR BLOOD PRESSURE SUPPORT)
PREFILLED_SYRINGE | INTRAVENOUS | Status: AC
  Filled 2016-04-04: qty 10

## 2016-04-04 MED ORDER — EPHEDRINE SULFATE 50 MG/ML IJ SOLN
INTRAMUSCULAR | Status: AC
  Filled 2016-04-04: qty 1

## 2016-04-04 MED ORDER — ROCURONIUM BROMIDE 100 MG/10ML IV SOLN
INTRAVENOUS | Status: DC | PRN
  Administered 2016-04-04: 50 mg via INTRAVENOUS
  Administered 2016-04-04: 20 mg via INTRAVENOUS
  Administered 2016-04-04: 30 mg via INTRAVENOUS

## 2016-04-04 MED ORDER — FENTANYL CITRATE (PF) 100 MCG/2ML IJ SOLN
INTRAMUSCULAR | Status: DC | PRN
  Administered 2016-04-04 (×4): 100 ug via INTRAVENOUS
  Administered 2016-04-04 (×2): 50 ug via INTRAVENOUS

## 2016-04-04 MED ORDER — PROPOFOL 1000 MG/100ML IV EMUL
5.0000 ug/kg/min | INTRAVENOUS | Status: DC
Start: 2016-04-04 — End: 2016-04-05
  Administered 2016-04-04: 40 ug/kg/min via INTRAVENOUS
  Administered 2016-04-04: 25 ug/kg/min via INTRAVENOUS
  Administered 2016-04-04 – 2016-04-05 (×3): 40 ug/kg/min via INTRAVENOUS
  Filled 2016-04-04 (×5): qty 100
  Filled 2016-04-04: qty 200

## 2016-04-04 SURGICAL SUPPLY — 54 items
BLADE SURG 11 STRL SS (BLADE) ×1 IMPLANT
BLADE SURG ROTATE 9660 (MISCELLANEOUS) IMPLANT
CANISTER SUCTION 2500CC (MISCELLANEOUS) ×2 IMPLANT
CHLORAPREP W/TINT 26ML (MISCELLANEOUS) ×2 IMPLANT
COVER SURGICAL LIGHT HANDLE (MISCELLANEOUS) ×2 IMPLANT
DRAPE INCISE IOBAN 66X45 STRL (DRAPES) ×1 IMPLANT
DRAPE INCISE IOBAN 85X60 (DRAPES) ×1 IMPLANT
DRAPE LAPAROSCOPIC ABDOMINAL (DRAPES) ×2 IMPLANT
DRAPE WARM FLUID 44X44 (DRAPE) ×2 IMPLANT
DRSG ADAPTIC 3X8 NADH LF (GAUZE/BANDAGES/DRESSINGS) ×1 IMPLANT
DRSG OPSITE POSTOP 4X10 (GAUZE/BANDAGES/DRESSINGS) IMPLANT
DRSG OPSITE POSTOP 4X8 (GAUZE/BANDAGES/DRESSINGS) IMPLANT
DRSG VAC ATS LRG SENSATRAC (GAUZE/BANDAGES/DRESSINGS) ×1 IMPLANT
DRSG VAC ATS SM SENSATRAC (GAUZE/BANDAGES/DRESSINGS) ×1 IMPLANT
DURAPREP 26ML APPLICATOR (WOUND CARE) ×1 IMPLANT
ELECT BLADE 6.5 EXT (BLADE) IMPLANT
ELECT CAUTERY BLADE 6.4 (BLADE) ×2 IMPLANT
ELECT REM PT RETURN 9FT ADLT (ELECTROSURGICAL) ×2
ELECTRODE REM PT RTRN 9FT ADLT (ELECTROSURGICAL) ×1 IMPLANT
GEL ULTRASOUND 20GR AQUASONIC (MISCELLANEOUS) ×1 IMPLANT
GLOVE BIOGEL M STRL SZ7.5 (GLOVE) ×2 IMPLANT
GLOVE BIOGEL PI IND STRL 8 (GLOVE) ×1 IMPLANT
GLOVE BIOGEL PI INDICATOR 8 (GLOVE) ×1
GOWN STRL REUS W/ TWL LRG LVL3 (GOWN DISPOSABLE) ×1 IMPLANT
GOWN STRL REUS W/ TWL XL LVL3 (GOWN DISPOSABLE) ×1 IMPLANT
GOWN STRL REUS W/TWL LRG LVL3 (GOWN DISPOSABLE) ×2
GOWN STRL REUS W/TWL XL LVL3 (GOWN DISPOSABLE) ×2
KIT BASIN OR (CUSTOM PROCEDURE TRAY) ×2 IMPLANT
KIT OSTOMY DRAINABLE 2.75 STR (WOUND CARE) ×1 IMPLANT
KIT ROOM TURNOVER OR (KITS) ×2 IMPLANT
LIGASURE IMPACT 36 18CM CVD LR (INSTRUMENTS) IMPLANT
MATRIX SURGICAL PSM 7X10CM (Tissue) ×1 IMPLANT
MICROMATRIX 1000MG (Tissue) ×4 IMPLANT
NS IRRIG 1000ML POUR BTL (IV SOLUTION) ×4 IMPLANT
PACK GENERAL/GYN (CUSTOM PROCEDURE TRAY) ×2 IMPLANT
PAD ARMBOARD 7.5X6 YLW CONV (MISCELLANEOUS) ×2 IMPLANT
PEN SKIN MARKING BROAD (MISCELLANEOUS) ×3 IMPLANT
SET ABDOMINAL WALL CLOSURE (MISCELLANEOUS) ×1 IMPLANT
SET ABDOMINAL WALL CLS EXT 30 (MISCELLANEOUS) ×1 IMPLANT
SOLUTION PARTIC MCRMTRX 1000MG (Tissue) IMPLANT
SPECIMEN JAR LARGE (MISCELLANEOUS) IMPLANT
SPONGE LAP 18X18 X RAY DECT (DISPOSABLE) IMPLANT
STAPLER VISISTAT 35W (STAPLE) ×3 IMPLANT
SUCTION POOLE TIP (SUCTIONS) ×2 IMPLANT
SUT CHROMIC 3 0 SH 27 (SUTURE) ×1 IMPLANT
SUT PDS AB 1 TP1 96 (SUTURE) ×4 IMPLANT
SUT SILK 2 0 SH CR/8 (SUTURE) ×2 IMPLANT
SUT SILK 2 0 TIES 10X30 (SUTURE) ×2 IMPLANT
SUT SILK 3 0 SH CR/8 (SUTURE) ×2 IMPLANT
SUT SILK 3 0 TIES 10X30 (SUTURE) ×2 IMPLANT
SUT VIC AB 3-0 SH 18 (SUTURE) IMPLANT
TOWEL OR 17X26 10 PK STRL BLUE (TOWEL DISPOSABLE) ×2 IMPLANT
TRAY FOLEY CATH 16FRSI W/METER (SET/KITS/TRAYS/PACK) IMPLANT
YANKAUER SUCT BULB TIP NO VENT (SUCTIONS) IMPLANT

## 2016-04-04 NOTE — Transfer of Care (Signed)
Immediate Anesthesia Transfer of Care Note  Patient: Christina Castaneda  Procedure(s) Performed: Procedure(s): EXPLORATORY LAPAROTOMY, PLACEMENT OF ABRA ABDOMINAL WALL CLOSURE SET  (N/A)  Patient Location: SICU  Anesthesia Type:General  Level of Consciousness: sedated and Patient remains intubated per anesthesia plan  Airway & Oxygen Therapy: Patient remains intubated per anesthesia plan and Patient placed on Ventilator (see vital sign flow sheet for setting)  Post-op Assessment: Report given to RN and Post -op Vital signs reviewed and stable  Post vital signs: Reviewed and stable  Last Vitals:  Filed Vitals:   04/04/16 0800 04/04/16 0815  BP:    Pulse: 56 76  Temp:    Resp: 11 16    Complications: No apparent anesthesia complications

## 2016-04-04 NOTE — Brief Op Note (Signed)
03/28/2016 - 04/04/2016  10:53 AM  PATIENT:  Christina Castaneda  60 y.o. female  PRE-OPERATIVE DIAGNOSIS:  OPEN ABDOMEN  POST-OPERATIVE DIAGNOSIS:  OPEN ABDOMEN  PROCEDURE:  Procedure(s): EXPLORATORY LAPAROTOMY, PLACEMENT OF ABRA ABDOMINAL WALL CLOSURE SET  (N/A)  SURGEON:  Surgeon(s) and Role:    * Greer Pickerel, MD - Primary    * Judeth Horn, MD - Assisting  PHYSICIAN ASSISTANT:   ASSISTANTS: see above   ANESTHESIA:   general  EBL:  Total I/O In: 500 [I.V.:500] Out: 370 [Urine:350; Blood:20]  BLOOD ADMINISTERED:none  DRAINS: Nasogastric Tube and Urinary Catheter (Foley)   LOCAL MEDICATIONS USED:  NONE  SPECIMEN:  No Specimen  DISPOSITION OF SPECIMEN:  N/A  COUNTS:  YES  TOURNIQUET:  * No tourniquets in log *  DICTATION: .Other Dictation: Dictation Number [id number]  PLAN OF CARE: return to ICU  PATIENT DISPOSITION:  ICU - intubated and hemodynamically stable.   Delay start of Pharmacological VTE agent (>24hrs) due to surgical blood loss or risk of bleeding: no  Leighton Ruff. Redmond Pulling, MD, FACS General, Bariatric, & Minimally Invasive Surgery [facility], [place]

## 2016-04-04 NOTE — Anesthesia Preprocedure Evaluation (Signed)
Anesthesia Evaluation  Patient identified by MRN, date of birth, ID band Patient awake    Reviewed: Allergy & Precautions, NPO status , Patient's Chart, lab work & pertinent test results  History of Anesthesia Complications Negative for: history of anesthetic complications  Airway Mallampati: Intubated       Dental no notable dental hx.    Pulmonary asthma , COPD, Current Smoker,    Pulmonary exam normal breath sounds clear to auscultation       Cardiovascular hypertension, Pt. on medications Normal cardiovascular exam Rhythm:Regular Rate:Normal     Neuro/Psych negative neurological ROS  negative psych ROS   GI/Hepatic negative GI ROS, Neg liver ROS,   Endo/Other  Hypothyroidism Morbid obesity  Renal/GU negative Renal ROS  negative genitourinary   Musculoskeletal negative musculoskeletal ROS (+)   Abdominal   Peds negative pediatric ROS (+)  Hematology negative hematology ROS (+)   Anesthesia Other Findings   Reproductive/Obstetrics negative OB ROS                             Anesthesia Physical Anesthesia Plan  ASA: IV  Anesthesia Plan: General   Post-op Pain Management:    Induction: Intravenous  Airway Management Planned: Oral ETT  Additional Equipment:   Intra-op Plan:   Post-operative Plan: Post-operative intubation/ventilation  Informed Consent: I have reviewed the patients History and Physical, chart, labs and discussed the procedure including the risks, benefits and alternatives for the proposed anesthesia with the patient or authorized representative who has indicated his/her understanding and acceptance.   Dental advisory given  Plan Discussed with: CRNA  Anesthesia Plan Comments:         Anesthesia Quick Evaluation

## 2016-04-04 NOTE — Progress Notes (Signed)
eLink Physician-Brief Progress Note Patient Name: Christina Castaneda DOB: 1956/12/23 MRN: [id number]   Date of Service  04/04/2016  HPI/Events of Note  Bradycardia - HR = 46. Patient is currently on a Propofol IV infusion and a Fentanyl IV infusion.   eICU Interventions  Will order: 1. D/C Propofol IV infusion. 2. Versed 1 mg IV Q 1 hour PRN.      Intervention Category Major Interventions: Arrhythmia - evaluation and management  Kerston Landeck Cornelia Copa 04/04/2016, 3:39 AM

## 2016-04-04 NOTE — Progress Notes (Signed)
PARENTERAL NUTRITION CONSULT NOTE - Follow Up  Pharmacy Consult for TPN Indication: Intolerance to enteral feeds (Expected prolonged ileus)  Allergies  Allergen Reactions  . Penicillins Anaphylaxis, Hives, Shortness Of Breath, Swelling and Other (See Comments)    Angioedema   Patient Measurements: Height: '5\' 6"'  (167.6 cm) Weight: (!) 337 lb 4.9 oz (153 kg) IBW/kg (Calculated) : 59.3  IBW: ~59.3kg Adjusted Body Weight: 82.4kg  Vital Signs: Temp: 97.6 F (36.4 C) (04/12 0400) Temp Source: Oral (04/12 0400) BP: 97/48 mmHg (04/12 0700) Pulse Rate: 50 (04/12 0700) Intake/Output from previous day: 04/11 0701 - 04/12 0700 In: 4876.3 [I.V.:2246.3; IV Piggyback:1200; TPN:1430] Out: 4709 [GGEZM:6294; Emesis/NG output:250; Drains:825] Intake/Output from this shift:   Labs:  Recent Labs  04/02/16 0349 04/03/16 0438 04/04/16 0214  WBC 9.4 13.5* 10.0  HGB 8.4* 8.3* 8.0*  HCT 28.0* 26.8* 26.3*  PLT 309 325 301    Recent Labs  04/02/16 0349 04/03/16 0438 04/04/16 0214  NA 133* 131* 133*  K 4.7 5.0 4.6  CL 104 103 105  CO2 '24 22 23  ' GLUCOSE 116* 134* 147*  BUN '10 15 16  ' CREATININE 0.87 0.81 0.81  CALCIUM 7.3* 7.2* 7.2*  MG 1.8 2.0 1.9  PHOS 2.9 2.6 2.9  PROT 4.4*  --   --   ALBUMIN 1.2* 1.1* 1.2*  AST 17  --   --   ALT 11*  --   --   ALKPHOS 34*  --   --   BILITOT 0.4  --   --   PREALBUMIN 3.2*  --   --   TRIG 50  --  49   Estimated Creatinine Clearance: 110 mL/min (by C-G formula based on Cr of 0.81).   Recent Labs  04/03/16 1730 04/03/16 2355 04/04/16 0529  GLUCAP 109* 134* 126*   Medical History: Past Medical History  Diagnosis Date  . Goiter   . Hypothyroidism   . Asthma     mild  . Hypertension   . Atypical endometrial hyperplasia   . Heart murmur   . Vitamin B12 deficiency   . Yeast infection     for last 3 weeks  . Gout   . COPD (chronic obstructive pulmonary disease) (HCC)    Insulin Requirements in the past 24 hours:  9 units of SSI  resistant  Admission: 48 yof 2 weeks out from emergency colectomy/colostomy. Discharged 4/2, feculent discharge noted from midline wound when having staples removed. Noted to have necrotic stoma w/ peritonitis, fascial necrosis. Pharmacy consulted to start TPN due to expected prolonged ileus.   Assessment: 4-7 Patient in the surgical ICU post-op on 4/6. No muscle or subcutaneous fat depletion noticed per RD assessment. 4-9 Triglycerides 48 on propofol this AM  Surgeries/Procedures: ~2 wks pta: emergency colectomy/colostomy 4/6: colostomy removal and end stapling of stoma, partial colectomy, ex-lap, omentectomy, wound debridement 4/7: lap, VAC change, possible debridement 4/10: Re-exploration of abdomen, Wound VAC change  GI: s/p Hartmann's procedure ~2wks ago. Pre-alb down to 3.2. Albumin is low at 1.2. Reutned back to OR on 4/10 for re-ex lap and VAC change. Now going back to OR today for placement of abdominal wall closure system. NG output up to 249m yesterday. Drain output is fairly stable at 8218m Endo: No DM hx. CBGs controlled (100-130s) on SSI. hypothyroidism - synthroid Lytes: wnl exc Na 133, K 4.6 (goal >/=4 w/ ileus), coCa~9.7, Mag 1.9 (goal >/=2 w/ ileus) phos ok at 2.9. Renal: SCr stable, normalized CrCl ~8083min. UOP ok  at 0.57m/kg/hr. NS at 553mhr Pulm: COPD. Vent post-op. CXR - improved pleural effusion/aeration. Fi02 at 40% Cards: HR ok and BR soft. NE gtt Hepatobil: LFTs/alk phos/tbili wnl, TG down to 49 Neuro: fentanyl gtt. Propofol off ID: Day #7 of Cipro/Flagyl for intra-abdominal infxn. Also on vancomycin. Afebrile, WBC up to 13.5. Best Practices: scds, lovenox ppx, ppi iv  TPN Access: PICC 03/29/16>> TPN start date: 03/29/16  Current Nutrition:  NPO Clinimix E 5/15 at 110 ml/hr  Nutritional Goals: (per RD recs 4/10) Kcal: 148367-2550Cal,  Protein: 145-155 g   Plan:  Continue Clinimix E 5/15 at 11067mr tonight Propofol gtt titrated off Hold 20% lipid  emulsion for first 7 days for ICU patients per ASPEN guidelines (Start date 4/6) *Could consider doing MWF to help try and meet lower Kcal goal better Continue NS at 42m57m TPN provides 132 g of protein and 1874 kCals per day meeting 90% of protein and 100% of kCal needs Will not be able to meet protein and kcal needs with Clinimix Continue MVI and TE in TPN Continue moderate SSI and adjust as needed Monitor TPN labs, renal function panel daily, K closely  NathElenor QuinonesarmD, BCPS Clinical Pharmacist Pager [phone]/2017 7:07 AM

## 2016-04-04 NOTE — Progress Notes (Signed)
Notified Dr Redmond Pulling of continued VAC leak; will come to bedside to fix.

## 2016-04-04 NOTE — Progress Notes (Signed)
Patient returned from OR and placed back on vent.

## 2016-04-04 NOTE — Progress Notes (Signed)
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 She has a PMH of OSA, COPD, Smoker, MO (300 lbs) and will remain on vent till abd wound stabilized. PCCM asked to manage vent.  SUBJECTIVE: Out of OR, no complications, no events.  VITAL SIGNS: BP 185/91 mmHg  Pulse 104  Temp(Src) 96.4 F (35.8 C) (Axillary)  Resp 16  Ht 5\' 6"  (1.676 m)  Wt 153 kg (337 lb 4.9 oz)  BMI 54.47 kg/m2  SpO2 99%  LMP 11/18/2012  HEMODYNAMICS: CVP:  [6 mmHg-20 mmHg] 20 mmHg  VENTILATOR SETTINGS: Vent Mode:  [-] PRVC FiO2 (%):  [40 %] 40 % Set Rate:  [16 bmp] 16 bmp Vt Set:  [600 mL] 600 mL PEEP:  [5 cmH20] 5 cmH20 Plateau Pressure:  [18 cmH20-25 cmH20] 25 cmH20  INTAKE / OUTPUT: I/O last 3 completed shifts: In: 7041.1 [I.V.:3351.1; NG/GT:30; IV Piggyback:1900] Out: 5570 [id number]; Emesis/NG output:250; Drains:1325]  PHYSICAL EXAMINATION: General:  No distress. Obeys commands Neuro:  Moves all ext. No focal deficits.  HEENT:  ETT in place, no thyormegaly, JVD Cardiovascular:  RRR, no MRG Lungs: Clear. No wheeze, crackles Abdomen:  Soft, Wound vac in place. Integument:  Warm & dry. No rash.  LABS:  BMET  Recent Labs Lab 04/02/16 0349 04/03/16 0438 04/04/16 0214  NA 133* 131* 133*  K 4.7 5.0 4.6  CL 104 103 105  CO2 24 22 23   BUN 10 15 16   CREATININE 0.87 0.81 0.81  GLUCOSE 116* 134* 147*   Electrolytes  Recent Labs Lab 04/02/16 0349 04/03/16 0438 04/04/16 0214  CALCIUM 7.3* 7.2* 7.2*  MG 1.8 2.0 1.9  PHOS 2.9 2.6 2.9   CBC  Recent Labs Lab 04/02/16 0349 04/03/16 0438 04/04/16 0214  WBC 9.4 13.5* 10.0  HGB 8.4* 8.3* 8.0*    HCT 28.0* 26.8* 26.3*  PLT 309 325 301   Coag's No results for input(s): APTT, INR in the last 168 hours.  Sepsis Markers  Recent Labs Lab 03/28/16 1722 03/29/16 2000 03/29/16 2320 03/31/16 1500 04/01/16 0320 04/02/16 0349  LATICACIDVEN 1.63 0.9 1.0  --   --   --   PROCALCITON  --   --   --  0.85 0.62 0.27   ABG  Recent Labs Lab 03/29/16 0320 03/30/16 0458  PHART 7.380 7.345*  PCO2ART 46.4* 41.9  PO2ART 135.0* 116.0*   Liver Enzymes  Recent Labs Lab 03/29/16 1411 03/30/16 0500  04/02/16 0349 04/03/16 0438 04/04/16 0214  AST 25 20  --  17  --   --   ALT 18 16  --  11*  --   --   ALKPHOS 41 38  --  34*  --   --   BILITOT 1.0 0.9  --  0.4  --   --   ALBUMIN 2.1* 1.8*  < > 1.2* 1.1* 1.2*  < > = values in this interval not displayed.  Cardiac Enzymes No results for input(s): TROPONINI, PROBNP in the last 168 hours.  Glucose  Recent Labs Lab 04/02/16 2338 04/03/16 0615 04/03/16 1159 04/03/16 1730 04/03/16 2355 04/04/16 0529  GLUCAP 100* 109* 128* 109* 134*  126*   Imaging No results found.  STUDIES:  No new CXR  MICROBIOLOGY: Blood Ctx x2 4/9>>> GPCs Urine Ctx 4/9>>> Coag neg staph Tracheal Asp Ctx 4/9>>> Blood Ctx x2 4/5>>> Urine Ctx 4/5:  Multiple species present MRSA PCR 4/6:  Negative   ANTIBIOTICS: Cipro 4/5>>> Flagyl 4/5>>> Vanco 4/11 >>  SIGNIFICANT EVENTS: 3/20 Exp lap appendectomy,removal sigmoid colon, bowel repair in setting diverticulitis. [facility] 4/6 Exp Lap , colostomy removal, partial colectomy,omentectom, wound debridement, open wound with wound vac.  LINES/TUBES: OETT 8.0 4/6>>> R PICC 4/6>>> L RADIAL ART LINE 4/7>>> FOLEY 4/6>>> NGT 4/6>>> WOUND VAC 4/6>>> PIV x2  ASSESSMENT / PLAN:  PULMONARY A: Acute Hypoxic Respiratory Failure H/O Asthma vs COPD Chronic Tobacco Use H/O OSA  P:   No weaning for now until abd is closed.  Full Vent Support for now Albuterol neb TID Adjust vent for ABG. No weaning for 2  wks  CARDIOVASCULAR A:  Septic Shock - Worsening. Volume up. H/O HTN  P:  D/C levophed (very hypertensive this AM). Vitals per unit protocol. Monitor on telemetry.  RENAL A:   Acute Renal Failure - Improving. UOP improving.  P:   Monitoring electrolytes & renal function daily. Replacing electrolytes as needed.  GASTROINTESTINAL A:     Post Exp lap with open wound, in discontinuity 4/6  P:   TPN. PPI for SUP.  HEMATOLOGIC A:   Anemia - Worsening but question dilution. No obvious active bleeding Leukocytosis - Improving. Likely secondary to sepsis.  P:  Follow CBC. Lovenox [place] daily. SCDs.  INFECTIOUS A:   Sepsis Peritonitis  Ostomy infection / cellulitis  P:   Empiric Cipro & Flagyl Day #8. On vanco form GPCs. Follow repeat cultures. Procalcitonin per algorithm - Improving.  ENDOCRINE A:   H/O Hypothyroidism   P:   Synthroid IV Resistant algorithm SSI Accu-Checks q6hr TPN per pharmacy.  NEUROLOGIC A:   Sedation on Ventilator Pain Control  P:   RASS goal: -2. Propofol gtt restarted. Fentanyl gtt & IV prn.  FAMILY  - Updates: No family bedside 4/12. - Inter-disciplinary family meet or Palliative Care meeting due by: 4/12  The patient is critically ill with multiple organ systems failure and requires high complexity decision making for assessment and support, frequent evaluation and titration of therapies, application of advanced monitoring technologies and extensive interpretation of multiple databases.   Critical Care Time devoted to patient care services described in this note is  35  Minutes. This time reflects time of care of this signee Dr Jennet Maduro. This critical care time does not reflect procedure time, or teaching time or supervisory time of PA/NP/Med student/Med Resident etc but could involve care discussion time.  Rush Farmer, M.D. [facility] Pulmonary/Critical Care Medicine. Pager: [phone]. After hours pager:  [phone].  04/04/2016, 1:04 PM

## 2016-04-04 NOTE — Progress Notes (Signed)
Called [facility] d/t sustained sinus bradycardia in 40s. Spoke with Dr. Oletta Darter, got orders to d/c propofol, which was at 72mcg, and got PRN versed added if needed for sedation purposes. Will monitor over next hour to see if any improvement.  Pt currently resting comfortably.  Henreitta Leber, RN 3:42 AM 04/04/2016

## 2016-04-05 ENCOUNTER — Inpatient Hospital Stay (HOSPITAL_COMMUNITY): Payer: BLUE CROSS/BLUE SHIELD

## 2016-04-05 ENCOUNTER — Encounter (HOSPITAL_COMMUNITY): Payer: Self-pay | Admitting: General Surgery

## 2016-04-05 DIAGNOSIS — J9621 Acute and chronic respiratory failure with hypoxia: Secondary | ICD-10-CM | POA: Insufficient documentation

## 2016-04-05 DIAGNOSIS — J9600 Acute respiratory failure, unspecified whether with hypoxia or hypercapnia: Secondary | ICD-10-CM | POA: Insufficient documentation

## 2016-04-05 LAB — GLUCOSE, CAPILLARY
GLUCOSE-CAPILLARY: 104 mg/dL — AB (ref 65–99)
GLUCOSE-CAPILLARY: 119 mg/dL — AB (ref 65–99)
Glucose-Capillary: 134 mg/dL — ABNORMAL HIGH (ref 65–99)
Glucose-Capillary: 91 mg/dL (ref 65–99)
Glucose-Capillary: 78 mg/dL (ref 65–99)

## 2016-04-05 LAB — COMPREHENSIVE METABOLIC PANEL
ALT: 15 U/L (ref 14–54)
AST: 32 U/L (ref 15–41)
Albumin: 1.1 g/dL — ABNORMAL LOW (ref 3.5–5.0)
Alkaline Phosphatase: 46 U/L (ref 38–126)
Anion gap: 7 (ref 5–15)
BUN: 18 mg/dL (ref 6–20)
CHLORIDE: 105 mmol/L (ref 101–111)
CO2: 22 mmol/L (ref 22–32)
CREATININE: 0.73 mg/dL (ref 0.44–1.00)
Calcium: 7.2 mg/dL — ABNORMAL LOW (ref 8.9–10.3)
GFR calc Af Amer: >60 mL/min (ref >60)
Glucose, Bld: 141 mg/dL — ABNORMAL HIGH (ref 65–99)
Potassium: 4.5 mmol/L (ref 3.5–5.1)
Sodium: 134 mmol/L — ABNORMAL LOW (ref 135–145)
Total Bilirubin: 0.3 mg/dL (ref 0.3–1.2)
Total Protein: 4.3 g/dL — ABNORMAL LOW (ref 6.5–8.1)

## 2016-04-05 LAB — PROCALCITONIN: Procalcitonin: 0.31 ng/mL

## 2016-04-05 LAB — CBC WITH DIFFERENTIAL/PLATELET
BASOS PCT: 0 %
Basophils Absolute: 0 10*3/uL (ref 0.0–0.1)
EOS ABS: 0.2 10*3/uL (ref 0.0–0.7)
EOS PCT: 1 %
HEMATOCRIT: 30.8 % — AB (ref 36.0–46.0)
Hemoglobin: 9.3 g/dL — ABNORMAL LOW (ref 12.0–15.0)
LYMPHS PCT: 13 %
Lymphs Abs: 2.3 10*3/uL (ref 0.7–4.0)
MCH: 28.8 pg (ref 26.0–34.0)
MCHC: 30.2 g/dL (ref 30.0–36.0)
MCV: 95.4 fL (ref 78.0–100.0)
MONOS PCT: 9 %
Monocytes Absolute: 1.6 10*3/uL — ABNORMAL HIGH (ref 0.1–1.0)
NEUTROS ABS: 13.6 10*3/uL — AB (ref 1.7–7.7)
Neutrophils Relative %: 77 %
Platelets: 425 10*3/uL — ABNORMAL HIGH (ref 150–400)
RBC: 3.23 MIL/uL — ABNORMAL LOW (ref 3.87–5.11)
RDW: 16.1 % — ABNORMAL HIGH (ref 11.5–15.5)
WBC MORPHOLOGY: INCREASED
WBC: 17.7 10*3/uL — ABNORMAL HIGH (ref 4.0–10.5)

## 2016-04-05 LAB — POCT I-STAT 3, ART BLOOD GAS (G3+)
ACID-BASE DEFICIT: 2 mmol/L (ref 0.0–2.0)
BICARBONATE: 24.9 meq/L — AB (ref 20.0–24.0)
O2 SAT: 98 %
PCO2 ART: 53.3 mmHg — AB (ref 35.0–45.0)
PH ART: 7.276 — AB (ref 7.350–7.450)
Patient temperature: 36.6
TCO2: 27 mmol/L (ref 0–100)
pO2, Arterial: 113 mmHg — ABNORMAL HIGH (ref 80.0–100.0)

## 2016-04-05 LAB — PHOSPHORUS: Phosphorus: 3.1 mg/dL (ref 2.5–4.6)

## 2016-04-05 LAB — MAGNESIUM: MAGNESIUM: 2.1 mg/dL (ref 1.7–2.4)

## 2016-04-05 MED ORDER — ETOMIDATE 2 MG/ML IV SOLN: 40.0000 mg | Freq: Once | INTRAVENOUS | Status: DC

## 2016-04-05 MED ORDER — SODIUM CHLORIDE 0.9 % IV SOLN
0.0000 mg/h | INTRAVENOUS | Status: DC
  Administered 2016-04-05: 2 mg/h via INTRAVENOUS
  Filled 2016-04-05 (×3): qty 10

## 2016-04-05 MED ORDER — MIDAZOLAM HCL 5 MG/ML IJ SOLN
0.0000 mg/h | INTRAMUSCULAR | Status: DC
  Filled 2016-04-05: qty 20

## 2016-04-05 MED ORDER — VECURONIUM BROMIDE 10 MG IV SOLR
10.0000 mg | Freq: Once | INTRAVENOUS | Status: AC
  Administered 2016-04-05: 10 mg via INTRAVENOUS

## 2016-04-05 MED ORDER — VECURONIUM BROMIDE 10 MG IV SOLR
INTRAVENOUS | Status: AC
  Filled 2016-04-05: qty 10

## 2016-04-05 MED ORDER — LIDOCAINE VISCOUS 2 % MT SOLN
OROMUCOSAL | Status: AC
  Filled 2016-04-05: qty 15

## 2016-04-05 MED ORDER — MIDAZOLAM HCL 2 MG/2ML IJ SOLN: 1.0000 mg | INTRAMUSCULAR | Status: DC | PRN

## 2016-04-05 MED ORDER — MIDAZOLAM HCL 2 MG/2ML IJ SOLN: 2.0000 mg | INTRAMUSCULAR | Status: DC | PRN

## 2016-04-05 MED ORDER — MIDAZOLAM BOLUS VIA INFUSION
1.0000 mg | INTRAVENOUS | Status: DC | PRN
  Administered 2016-04-07: 2 mg via INTRAVENOUS
  Administered 2016-04-12: 1 mg via INTRAVENOUS
  Filled 2016-04-05 (×3): qty 2

## 2016-04-05 MED ORDER — MIDAZOLAM HCL 2 MG/2ML IJ SOLN: 4.0000 mg | Freq: Once | INTRAMUSCULAR | Status: DC

## 2016-04-05 MED ORDER — SODIUM CHLORIDE 0.9 % IV SOLN
0.0000 mg/h | INTRAVENOUS | Status: DC
  Administered 2016-04-05: 4 mg/h via INTRAVENOUS
  Administered 2016-04-06: 5 mg/h via INTRAVENOUS
  Administered 2016-04-06: 4 mg/h via INTRAVENOUS
  Administered 2016-04-07 – 2016-04-08 (×4): 10 mg/h via INTRAVENOUS
  Filled 2016-04-05 (×7): qty 10

## 2016-04-05 MED ORDER — IOPAMIDOL (ISOVUE-300) INJECTION 61%: 50.0000 mL | Freq: Once | INTRAVENOUS | Status: DC | PRN

## 2016-04-05 MED ORDER — TRACE MINERALS CR-CU-MN-SE-ZN 10-1000-500-60 MCG/ML IV SOLN
INTRAVENOUS | Status: AC
  Administered 2016-04-05: 19:00:00 via INTRAVENOUS
  Filled 2016-04-05: qty 2640

## 2016-04-05 MED ORDER — FENTANYL CITRATE (PF) 100 MCG/2ML IJ SOLN: 200.0000 ug | Freq: Once | INTRAMUSCULAR | Status: DC

## 2016-04-05 MED ORDER — IOHEXOL 300 MG/ML  SOLN
50.0000 mL | Freq: Once | INTRAMUSCULAR | Status: AC | PRN
  Administered 2016-04-05: 50 mL via ORAL

## 2016-04-05 MED ORDER — FENTANYL CITRATE (PF) 100 MCG/2ML IJ SOLN: 25.0000 ug | INTRAMUSCULAR | Status: DC | PRN

## 2016-04-05 MED ORDER — PROPOFOL 500 MG/50ML IV EMUL
5.0000 ug/kg/min | Freq: Once | INTRAVENOUS | Status: AC
  Administered 2016-04-05: 40 ug/kg/min via INTRAVENOUS

## 2016-04-05 NOTE — Op Note (Signed)
NAMEJACOB, WOLAK NO.:  [id number]  MEDICAL RECORD NO.:  [id number]  LOCATION:  2S11C                        FACILITY:  [place]  PHYSICIAN:  Leighton Ruff. 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  PROCEDURES:  Re-exploration of abdomen; placement of ABRA abdominal wall closure device.  SURGEON:  Leighton Ruff. Redmond Pulling, MD, FACS  ASSISTANT SURGEON:  Gwenyth Ober, M.D. FACS  ANESTHESIA:  General.  EBL:  Minimal.  INDICATIONS FOR PROCEDURE:  The patient is an unfortunate, morbidly obese, Caucasian female, who was [place] out of [place] when she developed perforated sigmoid diverticulitis, which required emergency laparotomy at outside institution in [place].  She underwent sigmoid colectomy with end-colostomy.  She presented at our institution with stool draining from her midline wound.  She underwent operative evaluation, which required resection of her colostomy, debridement of her left abdominal wall rectus muscle.  She was left with an open abdomen.  She was then brought back for re-exploration and had completion left colectomy and creation of a new right upper quadrant colostomy.  She has maintained an open  abdomen.  She had quite an extensive fascial defect that was not able to be brought together primarily.  The width of the fascial separation was approximately 20 cm.  After discussing several options with the patient's husband, he elected for [place] to place the dynamic tissue systems-abdominal wall closure set, also known as ABRA.  We discussed at length the risks and benefits of the procedure.  Please see my notes for discussion.  FINDINGS:  At the time of operation today, the patient had a fascial defect of 20 cm wide by 22 cm vertical.  DESCRIPTION OF PROCEDURE:  After obtaining informed consent, she was taken to the OR #1 at [facility].  Her endotracheal tube circuit was connected to the anesthesia machine and full general endotracheal anesthesia was established after being placed supine on the OR table.  The patient already had sequential compression devices.  Her open abdominal ABThera wound VAC was removed.  Her abdomen was prepped and draped in usual standard surgical fashion with Betadine.  A surgical time-out was performed.  She was on broad-spectrum antibiotics already. Surgical time-out was performed.  Her abdomen was inspected.  The bowel was viable.  She had a decent amount of omentum overlying the small bowel.  There was no evidence of additional necrotic abdominal wall.  At this point, we took a marking pen and placed 5 cm marks from the fascial edge on the skin surface along both the left and right side  of the abdomen.  Then, starting at our ostomy site on the patient's right side, we outlined the ostomy appliance device through an outline on the skin. We then measured 1.75 cm above and below that mark to create our first new hashtag mark.  We then measured 3-cm intervals from that superior and inferior marks to mark our hash marks and all those were approximately 5 cm from the fascial edge.  We then did the same thing on the contralateral side where there was no  colostomy.  Again, we made a 5- cm mark on the skin surface that was 5 cm from the fascial edge.  We then connected all the dots with the line.  We then started marking at 3- cm intervals along that line to creat hash marks.  It should be noted that the patient had an old fascial and skin defect from her original colostomy in the left lower quadrant.  We then made a small stab incision with electrocautery at each of those hash marks.  We then, using the trocar device, placed the elastomers on the patient's left Side-The side that did not have the current colostomy.  The trocar was placed perpendicular through the skin and fascia into the abdominal cavity.  The abdominal contents were protected with the Fish.  We then threaded the elastomer to the trocar and brought it back up on the left side and secured the other end to itself with a hemostat.  We placed all the elastomers on the patient's left side initially.  Prior to placing the elastomers on left side, we numbered the hash tags so that we could place the elastomers in proper fashion so that there was appropriate tension accounting for the colostomy on the right side.  I then passed the trocar on the right side on the side of the colostomy at the previously placed hashtags perpendicular to the skin and fashioned, bringing it into the abdominal cavity and then we passed the elastomer from the left side to the right side through the appropriate corresponding side.  Around the ostomy in the upper quadrant, two elastomers were placed above the colostomy through the same button and then two elastomers were placed through the same button inferior to the colostomy.  We then obtained the silicone sheet and trimmed it to accommodate the colostomy in the right upper quadrant.  We then placed the silicone sheath in the abdominal cavity, so that the edges were extending at least 2 cm distal to the placement of the elastomers.  We then placed the  elastomer retainer into the abdomen on top of the silicone sheet and trimmed it so that it was not pushing into the viscera or the fascia.  We then placed the elastomers into the appropriate retainer slot.  We then placed the button anchors and secured the elastomer on each side.  The elastomers were placed at 90 degree angles coming up through the button anchors.  We then set the elastomer tension to two times stretch for the majority of all the elastomers.  Around the colostomy where there were two elastomers per button anchor on the right side, that tension strength was set to 1-1/2 stretch.  We then reinspected our silicone sheet to make sure that it was not bunched and that the colostomy site was still protected.  We then performed the move, which is the myofascial massage on the left and right side of the abdominal wall three times for 10 seconds each.  We then measured  the fascial defect, at this point, it was 10 cm, part with.  We then placed ACell powder 2 g into the left lower quadrant open wound where the old colostomy had come through and then we sutured a piece of the ACell sheet inside the wound with several 3-0 chromic sutures and then placed Xeroform and then Wound Gel on top.  We then obtained a piece of negative pressure wound therapy, black sponge in the midline over the open wound and secured it to the skin with several skin staples so that the skin would be flushed with the wound VAC sponge.  We then placed the plastic drape over the wound VAC.  We then placed a black sponge into the left lower quadrant site on top of the Wound Gel and created a bridge with a sponge to the midline.  We then secured this with a plastic drape.  We then connected the wound VAC to suction.  We had a small leak, but we were able to get a good seal by adding some additional plastic drapes around the old colostomy site.  We then secured the buttons to the button tails to the abdominal wall.   We did not place any additional new tension on the elastomers point setting the button tails.  It should be noted that prior to placing the elastomers, we did place a large sheath of [place] across the abdomen for skin protection on the left and right side and then we obviously cut a hole in the [place] around the midline defect.  We then applied the appliance on the colostomy in the right upper quadrant.  All needle, instrument, and sponge counts were correct x2.  There were no immediate complications.  The patient tolerated this procedure well.  She will be taken back to the ICU in stable condition.     Leighton Ruff. Redmond Pulling, MD, FACS     EMW/MEDQ  D:  04/04/2016  T:  04/05/2016  Job:  [id number]

## 2016-04-05 NOTE — Progress Notes (Addendum)
PULMONARY / CRITICAL CARE MEDICINE   Name: Christina Castaneda MRN: [id number] DOB: 1956/02/06    ADMISSION DATE:  03/28/2016 CONSULTATION DATE:  4/6  REFERRING MD:  CCS  CHIEF COMPLAINT:  Vent managment  HISTORY OF PRESENT ILLNESS:   [place] WF with diverticulitis , surgery 3/20 Hartman's with colostomy at [facility], returns to [facility] with feculent drainage and required exp lap with open wound 4/6. She has a PMH of OSA, COPD, Smoker, MO (300 lbs) and will remain on vent till abd wound stabilized. PCCM asked to manage vent.  SUBJECTIVE:  No events overnight.  VITAL SIGNS: BP 109/41 mmHg  Pulse 52  Temp(Src) 98.4 F (36.9 C) (Axillary)  Resp 16  Ht 5\' 6"  (1.676 m)  Wt 353 lb (160.12 kg)  BMI 57.00 kg/m2  SpO2 100%  LMP 11/18/2012  HEMODYNAMICS: CVP:  [10 mmHg-20 mmHg] 10 mmHg  VENTILATOR SETTINGS: Vent Mode:  [-] PRVC FiO2 (%):  [40 %] 40 % Set Rate:  [16 bmp-22 bmp] 22 bmp Vt Set:  [600 mL] 600 mL PEEP:  [5 cmH20] 5 cmH20 Plateau Pressure:  [22 cmH20-25 cmH20] 24 cmH20  INTAKE / OUTPUT: I/O last 3 completed shifts: In: 9394.2 [I.V.:3724.2; IV [id number] Out: [id number]; Emesis/NG output:250; Drains:1350; Blood:20]  PHYSICAL EXAMINATION: General:  Mild distress, sedated unresponsive Neuro:  Moves all ext. No focal deficits.  HEENT:  ETT in place, no thyormegaly, JVD Cardiovascular:  RRR, no MRG Lungs: Clear. No wheeze, crackles Abdomen:  Soft, Abd closure device in place. Colostomy clean. Integument:  Warm & dry. No rash.  LABS:  BMET  Recent Labs Lab 04/03/16 0438 04/04/16 0214 04/05/16 0547  NA 131* 133* 134*  K 5.0 4.6 4.5  CL 103 105 105  CO2 22 23 22   BUN 15 16 18   CREATININE 0.81 0.81 0.73  GLUCOSE 134* 147* 141*   Electrolytes  Recent Labs Lab 04/03/16 0438 04/04/16 0214 04/05/16 0547  CALCIUM 7.2* 7.2* 7.2*  MG 2.0 1.9 2.1  PHOS 2.6 2.9 3.1   CBC  Recent Labs Lab 04/03/16 0438 04/04/16 0214 04/05/16 0547  WBC 13.5* 10.0 17.7*    HGB 8.3* 8.0* 9.3*  HCT 26.8* 26.3* 30.8*  PLT 325 301 425*   Coag's No results for input(s): APTT, INR in the last 168 hours.  Sepsis Markers  Recent Labs Lab 03/29/16 2000 03/29/16 2320 03/31/16 1500 04/01/16 0320 04/02/16 0349  LATICACIDVEN 0.9 1.0  --   --   --   PROCALCITON  --   --  0.85 0.62 0.27   ABG  Recent Labs Lab 03/30/16 0458 04/05/16 0453  PHART 7.345* 7.276*  PCO2ART 41.9 53.3*  PO2ART 116.0* 113.0*   Liver Enzymes  Recent Labs Lab 03/30/16 0500  04/02/16 0349 04/03/16 0438 04/04/16 0214 04/05/16 0547  AST 20  --  17  --   --  32  ALT 16  --  11*  --   --  15  ALKPHOS 38  --  34*  --   --  46  BILITOT 0.9  --  0.4  --   --  0.3  ALBUMIN 1.8*  < > 1.2* 1.1* 1.2* 1.1*  < > = values in this interval not displayed.  Cardiac Enzymes No results for input(s): TROPONINI, PROBNP in the last 168 hours.  Glucose  Recent Labs Lab 04/04/16 0529 04/04/16 1231 04/04/16 1853 04/04/16 1923 04/04/16 2346 04/05/16 0347  GLUCAP 126* 116* 102* 91 90 134*   Imaging Dg Chest [facility]  1 View  04/05/2016  CLINICAL DATA:  Intubated. EXAM: PORTABLE CHEST 1 VIEW COMPARISON:  03/31/2016 FINDINGS: The endotracheal tube remains in place and terminates approximately 2.5 cm above the carina. Enteric tube courses into the left upper abdomen with tip not imaged. Right PICC terminates over the lower SVC. Cardiomediastinal silhouette is unchanged. There is mild bibasilar atelectasis, with improved aeration of the right lung base. No sizable pleural effusion or pneumothorax is identified. IMPRESSION: Mild bibasilar atelectasis with improved aeration of the right base. Electronically Signed   By: Logan Bores M.D.   On: 04/05/2016 07:45    STUDIES:  No new CXR  MICROBIOLOGY: Blood Ctx x2 4/9>>> Coag neg staph Urine Ctx 4/9>>> Coag neg staph Tracheal Asp Ctx 4/9>>> Blood Ctx x2 4/5>>> Urine Ctx 4/5:  Multiple species present MRSA PCR 4/6:  Negative    ANTIBIOTICS: Cipro 4/5>>> Flagyl 4/5>>> Vanco 4/11 >>  SIGNIFICANT EVENTS: 3/20 Exp lap appendectomy,removal sigmoid colon, bowel repair in setting diverticulitis. [facility] 4/6 Exp Lap , colostomy removal, partial colectomy,omentectom, wound debridement, open wound with wound vac.  LINES/TUBES: OETT 8.0 4/6>>> R PICC 4/6>>> L RADIAL ART LINE 4/7>>> FOLEY 4/6>>> NGT 4/6>>> WOUND VAC 4/6>>> PIV x2  ASSESSMENT / PLAN:  PULMONARY A: Acute Hypoxic Respiratory Failure H/O Asthma vs COPD Chronic Tobacco Use H/O OSA  P:   No weaning as she has an open abdomen for atleast 2 more weeks. Will need a trach next week. Discussed with husband. He is agreeable for the trach. Full Vent Support for now Albuterol neb TID Adjust vent for ABG. Increase RR for hypercarbia.   CARDIOVASCULAR A:  Septic Shock - Worsening. Volume up. H/O HTN  P:  In levaphed. Will wean as tolerated Vitals per unit protocol. Monitor on telemetry.  RENAL A:   Acute Renal Failure - Improving. UOP improving.  P:   Monitoring electrolytes & renal function daily. Replacing electrolytes as needed.  GASTROINTESTINAL A:   4/13  Post Exp lap with open wound S/P wound closure device 4/12 P:   TPN. PPI for SUP.  HEMATOLOGIC A:   Anemia - Worsening but question dilution. No obvious active bleeding Leukocytosis - Higher today. Maybe from stress of surgery yesterday. Will monitor.   P:  Follow CBC. Lovenox [place] daily. SCDs.  INFECTIOUS A:   Sepsis Peritonitis  Ostomy infection / cellulitis Coag neg staph bacteremia and UTI  P:   Empiric Cipro & Flagyl Day #9 On vanco for Coag neg staph Follow repeat cultures. Procalcitonin per algorithm - Improving.  ENDOCRINE A:   H/O Hypothyroidism   P:   Synthroid IV Resistant algorithm SSI Accu-Checks q6hr TPN per pharmacy.  NEUROLOGIC A:   Sedation on Ventilator Pain Control  P:   RASS goal: -2. D/C propofol due to bradycardia. Start  versed drip.  Fentanyl gtt & IV prn.  FAMILY  - Updates: Husband updated over phone on 4/13.  - Inter-disciplinary family meet or Palliative Care meeting due by: 4/12  Critical care time- 35 mins.  Marshell Garfinkel MD [place] [facility] Pager [phone] If no answer or after 3pm call: [phone] 04/05/2016, 8:25 AM

## 2016-04-05 NOTE — Progress Notes (Signed)
1 Day Post-Op  Subjective: Sedated on the Vent, getting abdominal film.  Nothing from the NG and it sounds like it's in the back of her throat.  Well sedated on the Vent.    Objective: Vital signs in last 24 hours: Temp:  [96.4 F (35.8 C)-98.6 F (37 C)] 98.4 F (36.9 C) (04/13 0751) Pulse Rate:  [45-104] 52 (04/13 0751) Resp:  [9-20] 16 (04/13 0751) BP: (79-185)/(40-91) 109/41 mmHg (04/13 0751) SpO2:  [98 %-100 %] 100 % (04/13 0806) FiO2 (%):  [40 %] 40 % (04/13 0751) Weight:  [160.12 kg (353 lb)] 160.12 kg (353 lb) (04/13 0630) Last BM Date:  (UTA; pt intubated; fresh colostomy w/minimal serosanguinous ) IV fluids/TNA Drain 1000 Stool 0 Afebrile, BP stable WBC is up [place] 7.26/pco2 53/hco3 24.9 Intake/Output from previous day: 04/12 0701 - 04/13 0700 In: 6048.5 [I.V.:2598.5; IV Piggyback:1250; TPN:2200] Out: 9629 [Urine:3405; Drains:1000; Blood:20] Intake/Output this shift:    General appearance: Sedated on Vent,  Resp: clear to auscultation bilaterally and Anterior exam, she is getting film now.  sedated on VENT. GI: open ABRRA/wound vac in place.  No bowel sounds, nothing in ostomy pouch, ostomy looks good.  ABBRA system in place  Lab Results:   Recent Labs  04/04/16 0214 04/05/16 0547  WBC 10.0 17.7*  HGB 8.0* 9.3*  HCT 26.3* 30.8*  PLT 301 425*    BMET  Recent Labs  04/04/16 0214 04/05/16 0547  NA 133* 134*  K 4.6 4.5  CL 105 105  CO2 23 22  GLUCOSE 147* 141*  BUN 16 18  CREATININE 0.81 0.73  CALCIUM 7.2* 7.2*   PT/INR No results for input(s): LABPROT, INR in the last 72 hours.   Recent Labs Lab 03/29/16 1411 03/30/16 0500 04/01/16 0320 04/02/16 0349 04/03/16 0438 04/04/16 0214 04/05/16 0547  AST 25 20  --  17  --   --  32  ALT 18 16  --  11*  --   --  15  ALKPHOS 41 38  --  34*  --   --  46  BILITOT 1.0 0.9  --  0.4  --   --  0.3  PROT 5.3* 5.1*  --  4.4*  --   --  4.3*  ALBUMIN 2.1* 1.8* 1.2* 1.2* 1.1* 1.2* 1.1*     Lipase      Component Value Date/Time   LIPASE 64* 03/28/2016 1705     Studies/Results: Dg Chest Port 1 View  04/05/2016  CLINICAL DATA:  Intubated. EXAM: PORTABLE CHEST 1 VIEW COMPARISON:  03/31/2016 FINDINGS: The endotracheal tube remains in place and terminates approximately 2.5 cm above the carina. Enteric tube courses into the left upper abdomen with tip not imaged. Right PICC terminates over the lower SVC. Cardiomediastinal silhouette is unchanged. There is mild bibasilar atelectasis, with improved aeration of the right lung base. No sizable pleural effusion or pneumothorax is identified. IMPRESSION: Mild bibasilar atelectasis with improved aeration of the right base. Electronically Signed   By: Logan Bores M.D.   On: 04/05/2016 07:45    Medications: . albuterol  2.5 mg Nebulization TID  . antiseptic oral rinse  7 mL Mouth Rinse 10 times per day  . chlorhexidine gluconate (SAGE KIT)  15 mL Mouth Rinse BID  . ciprofloxacin  400 mg Intravenous Q12H  . enoxaparin (LOVENOX) injection  40 mg Subcutaneous Q24H  . feeding supplement (VITAL HIGH PROTEIN)  1,000 mL Per Tube Q24H  . insulin aspart  0-20 Units Subcutaneous  4 times per day  . levothyroxine  100 mcg Intravenous Daily  . metronidazole  500 mg Intravenous Q8H  . pantoprazole (PROTONIX) IV  40 mg Intravenous QHS  . sodium chloride flush  10-40 mL Intracatheter Q12H  . sodium chloride flush  10-40 mL Intracatheter Q12H  . vancomycin  1,500 mg Intravenous Q12H   . Marland KitchenTPN (CLINIMIX-E) Adult 110 mL/hr at 04/05/16 0400  . sodium chloride 50 mL/hr at 04/05/16 0400  . fentaNYL infusion INTRAVENOUS 400 mcg/hr (04/05/16 0856)  . norepinephrine (LEVOPHED) Adult infusion 16 mcg/min (04/05/16 0729)  . propofol (DIPRIVAN) infusion 40 mcg/kg/min (04/05/16 3428)     Assessment/Plan Open abdomen, history of perforated sigmoid diverticulitis with history of Hartmann's procedure 03/12/16 in [place], complicated by necrotic colostomy with intraabdominal  abscess, which required resection of ostomy, debridement of abdominal wall, completion of left colectomy and creation of a right upper quadrant colostomy. 1.  COLOSTOMY REMOVAL AND END STAPLING OF STOMA, PARTIAL COLECTOMY. EXPLORATORY LAPAROTOMY, OMENTECTOMY, DEBRIDEMENT WOUND 03/29/16. Judeth Horn, MD 2.  Re-exploration of open abdomen, application of abdominal wound vac application of wound vac+, 04/02/16, Greer Pickerel 3.  EXPLORATORY LAPAROTOMY, PLACEMENT OF ABRA ABDOMINAL WALL CLOSURE SET, 04/04/16, Dr. Greer Pickerel Acute respiratory failure/COPD/OSA/Tobacco use (tracheal aspirate 04/03/16 [place]) Septic shock Acute renal failure- Improving Anemia Hypothyroid Body mass index is 57 Antibiotics:  Lovenox 40 MGQ 24H -is this enough? SCD Antibiotics: Day 8, currently on Cipro/Flagyl/Vancomycin  Urine culture - Staph Coag negative  NO GROWTH blood cultures  Plan:  I don't think NG is in, film, is pending.  Feeding tube has been placed, and I assume film is to confirm placement.    Will continue current support level.        LOS: 8 days    Kazi Montoro 04/05/2016 [phone]

## 2016-04-05 NOTE — Anesthesia Postprocedure Evaluation (Signed)
Anesthesia Post Note  Patient: Christina Castaneda  Procedure(s) Performed: Procedure(s) (LRB): EXPLORATORY LAPAROTOMY, PLACEMENT OF ABRA ABDOMINAL WALL CLOSURE SET  (N/A)  Patient location during evaluation: ICU Anesthesia Type: General Level of consciousness: sedated Pain management: pain level controlled Vital Signs Assessment: post-procedure vital signs reviewed and stable Respiratory status: patient remains intubated per anesthesia plan Cardiovascular status: stable Anesthetic complications: no    Last Vitals:  Filed Vitals:   04/05/16 0715 04/05/16 0751  BP:  109/41  Pulse: 50 52  Temp:  36.9 C  Resp: 16 16    Last Pain:  Filed Vitals:   04/05/16 1102  PainSc: [place]

## 2016-04-05 NOTE — Progress Notes (Signed)
PARENTERAL NUTRITION CONSULT NOTE - Follow Up  Pharmacy Consult for TPN Indication: Intolerance to enteral feeds (Expected prolonged ileus)  Allergies  Allergen Reactions  . Penicillins Anaphylaxis, Hives, Shortness Of Breath, Swelling and Other (See Comments)    Angioedema   Patient Measurements: Height: '5\' 6"'  (167.6 cm) Weight: (!) 353 lb (160.12 kg) IBW/kg (Calculated) : 59.3  IBW: ~59.3kg Adjusted Body Weight: 82.4kg  Vital Signs: Temp: 97.9 F (36.6 C) (04/13 0403) Temp Source: Oral (04/13 0403) BP: 108/41 mmHg (04/13 0630) Pulse Rate: 50 (04/13 0715) Intake/Output from previous day: 04/12 0701 - 04/13 0700 In: 6048.5 [I.V.:2598.5; IV Piggyback:1250; TPN:2200] Out: 9458 [Urine:3405; Drains:1000; Blood:20] Intake/Output from this shift:   Labs:  Recent Labs  04/03/16 0438 04/04/16 0214 04/05/16 0547  WBC 13.5* 10.0 17.7*  HGB 8.3* 8.0* 9.3*  HCT 26.8* 26.3* 30.8*  PLT 325 301 425*    Recent Labs  04/03/16 0438 04/04/16 0214 04/05/16 0547  NA 131* 133* 134*  K 5.0 4.6 4.5  CL 103 105 105  CO2 '22 23 22  ' GLUCOSE 134* 147* 141*  BUN '15 16 18  ' CREATININE 0.81 0.81 0.73  CALCIUM 7.2* 7.2* 7.2*  MG 2.0 1.9 2.1  PHOS 2.6 2.9 3.1  PROT  --   --  4.3*  ALBUMIN 1.1* 1.2* 1.1*  AST  --   --  32  ALT  --   --  15  ALKPHOS  --   --  46  BILITOT  --   --  0.3  TRIG  --  49  --    Estimated Creatinine Clearance: 114.6 mL/min (by C-G formula based on Cr of 0.73).   Recent Labs  04/04/16 1923 04/04/16 2346 04/05/16 0347  GLUCAP 91 90 134*   Medical History: Past Medical History  Diagnosis Date  . Goiter   . Hypothyroidism   . Asthma     mild  . Hypertension   . Atypical endometrial hyperplasia   . Heart murmur   . Vitamin B12 deficiency   . Yeast infection     for last 3 weeks  . Gout   . COPD (chronic obstructive pulmonary disease) (HCC)    Insulin Requirements in the past 24 hours:  3 units of SSI resistant   Admission: 22 yof 2 weeks  out from emergency colectomy/colostomy. Discharged 4/2, feculent discharge noted from midline wound when having staples removed. Noted to have necrotic stoma w/ peritonitis, fascial necrosis. Pharmacy consulted to start TPN due to expected prolonged ileus.   Assessment: 4-7 Patient in the surgical ICU post-op on 4/6. No muscle or subcutaneous fat depletion noticed per RD assessment. 4-9 Triglycerides 48 on propofol this AM 4-13 TG 49 on 4/12; propofol resumed post-operatively 4/12 and running at 40 mcg/kg/min.   Surgeries/Procedures: ~2 wks pta: emergency colectomy/colostomy 4/6: colostomy removal and end stapling of stoma, partial colectomy, ex-lap, omentectomy, wound debridement 4/7: lap, VAC change, possible debridement 4/10: Re-exploration of abdomen, Wound VAC change 4/12: Re-exploration of abdomen, placement of abdominal wall closure device   GI: s/p Hartmann's procedure ~2wks ago. Pre-alb down to 3.2. Albumin is low at 1.1. Reutned back to OR on 4/12 for reexploration and placement of abdominal wall closure system. NG output 0 yesterday, Colostomy output 41m. Initiating trickle feeds today.  Endo: No DM hx. CBGs controlled (90-141) on SSI. hypothyroidism - synthroid Lytes:Na 134 (trend up), K 4.5 (goal >/=4 w/ ileus), coCa~9.52, Mag 2.1 (goal >/=2 w/ ileus) phos ok at 3.1 Renal: SCr stable,  normalized CrCl ~9m/min. UOP ok at 0.964mkg/hr. NS at 505mr Pulm: COPD. Vent post-op. CXR - improved pleural effusion/aeration. Fi02 at 40%>> pH 7.276, CO2 53 (resp acidosis)- plan for trach today.  Cards: Hypotensive on norepinephrine @ 16 mcg/min (increased), HR 50 (SB): both of which may be due to Propofol.  Hepatobil: LFTs/alk phos/tbili wnl, TG down to 49 Neuro: Fentanyl gtt @ 400. Propofol resumed 1300 4/12 post-op- currently at 40 mcg/kg/min>> now changing to Versed drip due to bradycardia. RASS -2 (at goal), GCS 13. CPOT 0 currently.  ID: Day #8 of Cipro/Flagyl for intra-abdominal infxn.  Also on vancomycin. Afebrile, WBC down to 10. Coagulase negative staph in blood and urine.  Best Practices: scds, lovenox [id number], ppi iv  TPN Access: PICC 03/29/16>> TPN start date: 03/29/16  Current Nutrition:  NPO >> initiating trickle feeds Clinimix E 5/15 at 110 ml/hr Propofol gtt stopping and changing to Versed gtt  Nutritional Goals: (per RD recs 4/10) Kcal: [id number],  Protein: 145-155 g   Plan:  Continue Clinimix E 5/15 to 110 ml/hr tonight (stopping propofol and going to versed) Continue to hold 20% lipid emulsion for first 7 days (day 7/7) for ICU patients per ASPEN guidelines (Start date 4/6)*Could consider doing MWF to help try and meet lower Kcal goal better Continue NS at 50ml72mTPN provides 132 g of protein and 1874 kCals per day meeting 90% of protein and 100% of kCal needs Will not be able to meet protein and kcal needs with Clinimix Continue MVI and TE in TPN Continue moderate SSI and adjust as needed Monitor TPN labs, renal function panel daily, K closely Follow-up toleration of trickle feeds and ability to advance.   JessiSloan LeiterrmD, BCPS Clinical Pharmacist [phone] 7:36 AM

## 2016-04-05 NOTE — Procedures (Signed)
Percutaneous Tracheostomy Placement  Consent from family.  Patient sedated, paralyzed and position.  Placed on 100% FiO2 and RR matched.  Area cleaned and draped.  Lidocaine/epi injected.  Skin incision done followed by blunt dissection.  Trachea palpated then punctured, catheter passed and visualized bronchoscopically.  Wire placed and visualized.  Catheter removed.  Airway then crushed and dilated.  Size 6 cuffed shiley trach placed and visualized bronchoscopically well above carina.  Good volume returns.  Patient tolerated the procedure well without complications.  Minimal blood loss.  CXR ordered and pending.  Amica Harron G. Timithy Arons, M.D. [place] [facility]. Pager: [phone]. After hours pager: [phone]. 

## 2016-04-05 NOTE — Procedures (Signed)
Bronchoscopy note:  At first bronch was introduced through ET tube and structures of tracheal rings, carina identified for operator of tracheostomy who was Dr Nelda Marseille. Light of bronch passed through trachea and skin for indentification of tracheal rings for tracheostomy puncture. After this, under bronchoscopy guidance, ET tube was pulled back sufficiently and very carefully. The ET tube was pulled back enough to give room for tracheostomy operator and yet at same time to to ensure a secured airway. After this was accomplished, bronchoscope was withdrawn into the ET tube. After this, Dr Nelda Marseille then performed tracheostomy under video visual provided by flexible video bronchoscopy. Followng introduction of tracheostomy, the bronchoscope was removed from ET tube and introduced through tracheostomy. Correct position of tracheostomy was ensured, with enough room between carina and distal tracheostomy and no evidence of bleeding. The bronchoscope was then withdrawn. Respiratory therapist was then instructed to remove the ET tube. Dr Nelda Marseille then proceeded to complete the tracheostomy with stay sutures   No complications   Marshell Garfinkel MD [facility] Pager [phone] If no answer or after 3pm call: [phone] 04/05/2016, 5:29 PM

## 2016-04-05 NOTE — Progress Notes (Signed)
Nutrition Consult/Follow Up  DOCUMENTATION CODES:   Morbid obesity  INTERVENTION:    Initiate Vital High Protein formula at 10 ml/hr - recommend goal rate of 70 ml/hr to provide 1680 kcals, 147 gm protein, 1404 ml of water   TPN per pharmacy  NUTRITION DIAGNOSIS:   Inadequate oral intake related to inability to eat as evidenced by NPO status, ongoing  GOAL:   Provide needs based on ASPEN/SCCM guidelines, met  MONITOR:   Vent status, TF tolerance, Labs, Weight trends, Skin, I & O's  ASSESSMENT:   60 y.o. Female presented with postoperative complication. 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 She went to a nearby hospital and was diagnosed with perforated diverticulitis. She underwent an exploratory laparotomy with sigmoid colon resection on March 20. She was discharged from the hospital there after a week and just returned home to [place] 5 days ago. Yesterday she reports developing leakage of fecal matter from her midline surgical incision site. She continues to have normal ostomy output from her left lower quadrant stoma. Over the last 24 hours she has also experienced increased generalized abdominal pain.  Patient s/p procedure 4/6: COLOSTOMY REMOVAL AND END STAPLING OF STOMA PARTIAL COLECTOMY EXPLORATORY LAPAROTOMY OMENTECTOMY DEBRIDEMENT WOUND  Patient is currently intubated on ventilator support -- NGT to LIS Temp (24hrs), Avg:97.7 F (36.5 C), Min:96.4 F (35.8 C), Max:98.6 F (37 C)   Plan to discontinue Propofol and start Versed.  Patient is receiving TPN with Clinimix E 5/15 @ 110 ml/hr. No lipids at this time. TPN providing 1874 kcal and 132 grams protein per day. Meets 100% minimum estimated energy needs and 90% minimum estimated protein needs.  Plan to start trickle feeding (Vital HP formula).  CORTRAK tube in place.  Tip in stomach at this time.  IR to try and advance  post-pyloric.  Diet Order:  Diet NPO time specified .TPN (CLINIMIX-E) Adult Diet NPO time specified .TPN (CLINIMIX-E) Adult  Skin:  Wound (see comment) (abdominal wound VAC)  Last BM:  N/A  Height:   Ht Readings from Last 1 Encounters:  04/05/16 '5\' 6"'  (1.676 m)    Weight:   Wt Readings from Last 1 Encounters:  04/05/16 353 lb (160.12 kg)    Ideal Body Weight:  59 kg  BMI:  Body mass index is 57 kg/(m^2).  Estimated Nutritional Needs:   Kcal:  9038-3338  Protein:  145-155 gm  Fluid:  per MD  EDUCATION NEEDS:   No education needs identified at this time  Arthur Holms, RD, LDN Pager #: [phone] After-Hours Pager #: [phone]

## 2016-04-05 NOTE — Progress Notes (Signed)
Per Dr Vaughan Browner, based on current ABG, Increased RR to 22 on ventilator. RT will continue to monitor.

## 2016-04-05 NOTE — Progress Notes (Signed)
Unable to complete trach and vent check at this time as pt is having a bedside procedure.

## 2016-04-06 ENCOUNTER — Inpatient Hospital Stay (HOSPITAL_COMMUNITY): Payer: BLUE CROSS/BLUE SHIELD

## 2016-04-06 LAB — BLOOD GAS, ARTERIAL
Acid-base deficit: 3.1 mmol/L — ABNORMAL HIGH (ref 0.0–2.0)
BICARBONATE: 22.4 meq/L (ref 20.0–24.0)
DRAWN BY: 36496
FIO2: 0.4
O2 SAT: 98.3 %
PATIENT TEMPERATURE: 98
PCO2 ART: 46.5 mmHg — AB (ref 35.0–45.0)
PEEP: 5 cmH2O
PH ART: 7.302 — AB (ref 7.350–7.450)
PO2 ART: 115 mmHg — AB (ref 80.0–100.0)
RATE: 22 resp/min
TCO2: 23.8 mmol/L (ref 0–100)
VT: 480 mL

## 2016-04-06 LAB — GLUCOSE, CAPILLARY
GLUCOSE-CAPILLARY: 111 mg/dL — AB (ref 65–99)
GLUCOSE-CAPILLARY: 85 mg/dL (ref 65–99)
Glucose-Capillary: 110 mg/dL — ABNORMAL HIGH (ref 65–99)
Glucose-Capillary: 89 mg/dL (ref 65–99)
Glucose-Capillary: 91 mg/dL (ref 65–99)

## 2016-04-06 LAB — RENAL FUNCTION PANEL
ANION GAP: 5 (ref 5–15)
Albumin: 1.1 g/dL — ABNORMAL LOW (ref 3.5–5.0)
BUN: 23 mg/dL — ABNORMAL HIGH (ref 6–20)
CALCIUM: 7.2 mg/dL — AB (ref 8.9–10.3)
CHLORIDE: 106 mmol/L (ref 101–111)
CO2: 23 mmol/L (ref 22–32)
Creatinine, Ser: 0.91 mg/dL (ref 0.44–1.00)
GFR calc non Af Amer: >60 mL/min (ref >60)
GLUCOSE: 120 mg/dL — AB (ref 65–99)
POTASSIUM: 5 mmol/L (ref 3.5–5.1)
Phosphorus: 3.6 mg/dL (ref 2.5–4.6)
SODIUM: 134 mmol/L — AB (ref 135–145)

## 2016-04-06 LAB — CBC WITH DIFFERENTIAL/PLATELET
BASOS ABS: 0 10*3/uL (ref 0.0–0.1)
Basophils Relative: 0 %
EOS PCT: 1 %
Eosinophils Absolute: 0.1 10*3/uL (ref 0.0–0.7)
HEMATOCRIT: 25.3 % — AB (ref 36.0–46.0)
HEMOGLOBIN: 7.2 g/dL — AB (ref 12.0–15.0)
LYMPHS ABS: 1.5 10*3/uL (ref 0.7–4.0)
Lymphocytes Relative: 13 %
MCH: 28.1 pg (ref 26.0–34.0)
MCHC: 28.5 g/dL — AB (ref 30.0–36.0)
MCV: 98.8 fL (ref 78.0–100.0)
MONO ABS: 1 10*3/uL (ref 0.1–1.0)
Monocytes Relative: 9 %
NEUTROS ABS: 8.8 10*3/uL — AB (ref 1.7–7.7)
Neutrophils Relative %: 77 %
Platelets: 323 10*3/uL (ref 150–400)
RBC: 2.56 MIL/uL — AB (ref 3.87–5.11)
RDW: 16.6 % — AB (ref 11.5–15.5)
WBC: 11.4 10*3/uL — AB (ref 4.0–10.5)

## 2016-04-06 LAB — BASIC METABOLIC PANEL
ANION GAP: 4 — AB (ref 5–15)
BUN: 23 mg/dL — AB (ref 6–20)
CALCIUM: 7.6 mg/dL — AB (ref 8.9–10.3)
CO2: 24 mmol/L (ref 22–32)
Chloride: 107 mmol/L (ref 101–111)
Creatinine, Ser: 0.82 mg/dL (ref 0.44–1.00)
GFR calc Af Amer: >60 mL/min (ref >60)
GLUCOSE: 110 mg/dL — AB (ref 65–99)
Potassium: 5.1 mmol/L (ref 3.5–5.1)
Sodium: 135 mmol/L (ref 135–145)

## 2016-04-06 LAB — PROCALCITONIN: PROCALCITONIN: 0.26 ng/mL

## 2016-04-06 LAB — MAGNESIUM: MAGNESIUM: 1.9 mg/dL (ref 1.7–2.4)

## 2016-04-06 MED ORDER — BUDESONIDE 0.25 MG/2ML IN SUSP
0.2500 mg | Freq: Two times a day (BID) | RESPIRATORY_TRACT | Status: DC
  Administered 2016-04-06 – 2016-04-23 (×35): 0.25 mg via RESPIRATORY_TRACT
  Filled 2016-04-06 (×35): qty 2

## 2016-04-06 MED ORDER — MAGNESIUM SULFATE 2 GM/50ML IV SOLN
2.0000 g | Freq: Once | INTRAVENOUS | Status: AC
  Administered 2016-04-06: 2 g via INTRAVENOUS
  Filled 2016-04-06: qty 50

## 2016-04-06 MED ORDER — TRACE MINERALS CR-CU-MN-SE-ZN 10-1000-500-60 MCG/ML IV SOLN
INTRAVENOUS | Status: AC
  Administered 2016-04-06: 17:00:00 via INTRAVENOUS
  Filled 2016-04-06: qty 2640

## 2016-04-06 MED ORDER — IPRATROPIUM-ALBUTEROL 0.5-2.5 (3) MG/3ML IN SOLN
3.0000 mL | Freq: Four times a day (QID) | RESPIRATORY_TRACT | Status: DC
  Administered 2016-04-06 – 2016-04-07 (×3): 3 mL via RESPIRATORY_TRACT
  Filled 2016-04-06 (×3): qty 3

## 2016-04-06 MED ORDER — ARFORMOTEROL TARTRATE 15 MCG/2ML IN NEBU
15.0000 ug | INHALATION_SOLUTION | Freq: Two times a day (BID) | RESPIRATORY_TRACT | Status: DC
  Administered 2016-04-06 – 2016-05-02 (×53): 15 ug via RESPIRATORY_TRACT
  Filled 2016-04-06 (×55): qty 2

## 2016-04-06 NOTE — Progress Notes (Signed)
04/06/2016 CCM paged and made aware of pt. Ventilator dyssynchrony despite increasing sedation and prn fentanyl bolus. Orders received to change settings to Pressure Support. Dr. Rosendo Gros at bedside and verbal order received not to change to weaning mode due to open abdomen. Orders enacted. Respiratory therapy updated on MD orders. Pt. Continued on PRVC. Will continue to closely monitor patient.  Endora Teresi, Arville Lime

## 2016-04-06 NOTE — Progress Notes (Signed)
Changed dressing on a-line per RN request. RT will continue to monitor.

## 2016-04-06 NOTE — Progress Notes (Signed)
PULMONARY / CRITICAL CARE MEDICINE   Name: Christina Castaneda MRN: [id number] DOB: 1956-11-23    ADMISSION DATE:  03/28/2016 CONSULTATION DATE:  03/29/2016  REFERRING MD:  CCS  CHIEF COMPLAINT:  Colostomy bag leaking  SUBJECTIVE:  Remains on sedation, pressors.  VITAL SIGNS: BP 127/55 mmHg  Pulse 66  Temp(Src) 98.4 F (36.9 C) (Oral)  Resp 20  Ht 5\' 6"  (1.676 m)  Wt 353 lb (160.12 kg)  BMI 57.00 kg/m2  SpO2 100%  LMP 11/18/2012  HEMODYNAMICS: CVP:  [10 mmHg-31 mmHg] 15 mmHg  VENTILATOR SETTINGS: Vent Mode:  [-] PRVC FiO2 (%):  [40 %] 40 % Set Rate:  [22 bmp] 22 bmp Vt Set:  [480 mL] 480 mL PEEP:  [5 cmH20] 5 cmH20 Plateau Pressure:  [14 cmH20-28 cmH20] 28 cmH20  INTAKE / OUTPUT: I/O last 3 completed shifts: In: 8439.4 [I.V.:2979.4; NG/GT:220; IV Piggyback:2600] Out: [id number] [Urine:4190; Drains:1400]  PHYSICAL EXAMINATION: General: sedated Neuro: RASS -2 HEENT: trach site clean Cardiac: regular Chest: b/l wheeze Abd: wound vac Ext: 2+ edema Skin: no rashes  LABS:  BMET  Recent Labs Lab 04/04/16 0214 04/05/16 0547 04/06/16 0530  NA 133* 134* 134*  K 4.6 4.5 5.0  CL 105 105 106  CO2 23 22 23   BUN 16 18 23*  CREATININE 0.81 0.73 0.91  GLUCOSE 147* 141* 120*   Electrolytes  Recent Labs Lab 04/04/16 0214 04/05/16 0547 04/06/16 0444 04/06/16 0530  CALCIUM 7.2* 7.2*  --  7.2*  MG 1.9 2.1 1.9  --   PHOS 2.9 3.1  --  3.6   CBC  Recent Labs Lab 04/04/16 0214 04/05/16 0547 04/06/16 0444  WBC 10.0 17.7* 11.4*  HGB 8.0* 9.3* 7.2*  HCT 26.3* 30.8* 25.3*  PLT 301 425* 323   Coag's No results for input(s): APTT, INR in the last 168 hours.  Sepsis Markers  Recent Labs Lab 04/02/16 0349 04/05/16 1022 04/06/16 0444  PROCALCITON 0.27 0.31 0.26   ABG  Recent Labs Lab 04/05/16 0453 04/06/16 0339  PHART 7.276* 7.302*  PCO2ART 53.3* 46.5*  PO2ART 113.0* 115*   Liver Enzymes  Recent Labs Lab 04/02/16 0349  04/04/16 0214  04/05/16 0547 04/06/16 0530  AST 17  --   --  32  --   ALT 11*  --   --  15  --   ALKPHOS 34*  --   --  46  --   BILITOT 0.4  --   --  0.3  --   ALBUMIN 1.2*  < > 1.2* 1.1* 1.1*  < > = values in this interval not displayed.  Cardiac Enzymes No results for input(s): TROPONINI, PROBNP in the last 168 hours.  Glucose  Recent Labs Lab 04/05/16 1203 04/05/16 1810 04/05/16 1917 04/05/16 2351 04/06/16 0352 04/06/16 0622  GLUCAP 119* 91 78 104* 111* 110*   Imaging Dg Abd 1 View  04/05/2016  CLINICAL DATA:  Attempted advancement of feeding tube EXAM: ABDOMEN - 1 VIEW COMPARISON:  Earlier abdominal radiograph 04/05/2016 FINDINGS: Feeding tube is coiled in the proximal stomach with tip projecting over mid stomach. Injected contrast material opacifies the gastric fundus. The feeding tube was unable to be placed beyond the pylorus. IMPRESSION: Feeding tube coiled in stomach. Electronically Signed   By: Lavonia Dana M.D.   On: 04/05/2016 18:51   Dg Abd 1 View  04/05/2016  CLINICAL DATA:  Encounter for feeding tube placement. EXAM: ABDOMEN - 1 VIEW COMPARISON:  CT, 03/28/2016 FINDINGS: Enteric feeding tube  passes below the diaphragm. Tip projects in the left mid abdomen, consistent with the metallic tip being in the distal stomach. Normal bowel gas pattern. Surgical vascular clips in the right upper quadrant from a prior cholecystectomy. There are skin staples in the left upper abdomen. IMPRESSION: Enteric feeding tube tip projects in the distal stomach. Electronically Signed   By: Lajean Manes M.D.   On: 04/05/2016 09:31   Dg Chest Port 1 View  04/06/2016  CLINICAL DATA:  Acute respiratory failure. EXAM: PORTABLE CHEST 1 VIEW COMPARISON:  04/05/2016. FINDINGS: Tracheostomy tube, feeding tube, right PICC line in stable position. Cardiomegaly with normal pulmonary vascularity. Mild bibasilar atelectasis. Mild infiltrate left lung base cannot be excluded. No pleural effusion or pneumothorax  IMPRESSION: One lines and tubes in stable position. 1. Low lung volumes with mild bibasilar atelectasis. Mild infiltrate left lung base cannot be excluded Electronically Signed   By: [place]   On: 04/06/2016 07:55   Dg Chest Port 1 View  04/05/2016  CLINICAL DATA:  Tracheostomy EXAM: PORTABLE CHEST 1 VIEW COMPARISON:  04/05/2016 and 03/31/2016 FINDINGS: Cardiomediastinal silhouette is stable. Endotracheal tube has been removed. New tracheostomy tube in place. There is NG feeding tube in place. Persistent mild basilar atelectasis. Central mild vascular congestion without convincing pulmonary edema. There is no evidence of pneumothorax. IMPRESSION: New tracheostomy tube in place. There is NG feeding tube in place. Persistent mild basilar atelectasis. Central mild vascular congestion without convincing pulmonary edema. There is no evidence Electronically Signed   By: Lahoma Crocker M.D.   On: 04/05/2016 11:42   Dg Addison Bailey G Tube Plc W/fl-no Rad  04/05/2016  CLINICAL DATA:  NASO G TUBE PLACEMENT WITH FLUORO Fluoroscopy was utilized by the requesting physician.  No radiographic interpretation.    STUDIES:    MICROBIOLOGY: Blood Ctx x2 4/9>>> Coag neg staph Urine Ctx 4/9>>> Coag neg staph Tracheal Asp Ctx 4/9>>> yeast Blood Ctx x2 4/5>>> negative Urine Ctx 4/5:  Multiple species present MRSA PCR 4/6:  Negative   ANTIBIOTICS: Cipro 4/5>>> Flagyl 4/5>>> Vanco 4/11 >>  SIGNIFICANT EVENTS: 3/20 Exp lap appendectomy,removal sigmoid colon, bowel repair in setting diverticulitis. [facility] 4/6 Exp Lap , colostomy removal, partial colectomy,omentectom, wound debridement, open wound with wound vac. 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  Noted to have feculent material from midline wound and present  to ER.  ASSESSMENT / PLAN:  PULMONARY A: Compromised airway. 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 Hx of COPD with asthma, OSA >> wheezing noted 4/14. Tobacco abuse. P:   Pressure support wean as tolerated F/u CXR Brovana, pulmicort with prn albuterol  CARDIOVASCULAR A:  Septic shock. Hx of HTN. P:  Pressors to keep MAP > 65  RENAL A:   AKI >> resolved. P:   Monitor renal fx, urine outpt  GASTROINTESTINAL A:   Peritonitis s/p laparotomy. P:   Post-op care, TPN per CCS Protonix for SUP  HEMATOLOGIC A:   Anemia of critical illness. P:  F/u CBC Transfuse for Hb < 7 Lovenox for DVT prevention  INFECTIOUS A:   Septic shock 2nd to peritonitis and cellulitis of ostomy site. Coag neg Staph bacteremia, UTI. Hx of PCN allergy. P:   Day 10 cipro, flagyl for peritonitis Day 5 vancomycin for Coag neg staph  ENDOCRINE A:  Hx of hypothyroidism. P:   Synthroid IV  NEUROLOGIC A:   Acute metabolic encephalopathy. Bradycardia from diprivan. P:   RASS goal: -2   CC time 36 minutes.  Chesley Mires, MD [facility] 04/06/2016, 9:10 AM Pager:  [phone] After 3pm call: [phone]

## 2016-04-06 NOTE — Progress Notes (Signed)
04/06/2016 1300 Nursing note 75 mcg Fentanyl and 4 mg Versed given via bolus from bag per verbal order Dr. Rosendo Gros during [facility] dressing change. Will continue to closely monitor patient.  Ivoree Felmlee, Arville Lime

## 2016-04-06 NOTE — Progress Notes (Addendum)
04/06/2016 0930 Nursing note Dr. Rosendo Gros at bedside states he will return shortly to complete abdominal dressing change. Verbal order received ok to leave VAC off for right now and disconnected from patient. Will continue to closely monitor patient.  Naji Mehringer, Arville Lime

## 2016-04-06 NOTE — Progress Notes (Signed)
PARENTERAL NUTRITION CONSULT NOTE - Follow Up  Pharmacy Consult for TPN Indication: Intolerance to enteral feeds (Expected prolonged ileus)  Allergies  Allergen Reactions  . Penicillins Anaphylaxis, Hives, Shortness Of Breath, Swelling and Other (See Comments)    Angioedema   Patient Measurements: Height: '5\' 6"'  (167.6 cm) Weight: (!) 353 lb (160.12 kg) IBW/kg (Calculated) : 59.3  IBW: ~59.3kg Adjusted Body Weight: 82.4kg  Vital Signs: Temp: 98.4 F (36.9 C) (04/14 0400) Temp Source: Oral (04/14 0400) BP: 120/50 mmHg (04/13 2008) Pulse Rate: 58 (04/14 0700) Intake/Output from previous day: 04/13 0701 - 04/14 0700 In: 4709.6 [I.V.:1359.6; NG/GT:220; IV Piggyback:1700; TPN:1430] Out: 2585 [Urine:1685; Drains:900] Intake/Output from this shift:   Labs:  Recent Labs  04/04/16 0214 04/05/16 0547 04/06/16 0444  WBC 10.0 17.7* 11.4*  HGB 8.0* 9.3* 7.2*  HCT 26.3* 30.8* 25.3*  PLT 301 425* 323    Recent Labs  04/04/16 0214 04/05/16 0547 04/06/16 0444 04/06/16 0530  NA 133* 134*  --  134*  K 4.6 4.5  --  5.0  CL 105 105  --  106  CO2 23 22  --  23  GLUCOSE 147* 141*  --  120*  BUN 16 18  --  23*  CREATININE 0.81 0.73  --  0.91  CALCIUM 7.2* 7.2*  --  7.2*  MG 1.9 2.1 1.9  --   PHOS 2.9 3.1  --  3.6  PROT  --  4.3*  --   --   ALBUMIN 1.2* 1.1*  --  1.1*  AST  --  32  --   --   ALT  --  15  --   --   ALKPHOS  --  46  --   --   BILITOT  --  0.3  --   --   TRIG 49  --   --   --    Estimated Creatinine Clearance: 100.8 mL/min (by C-G formula based on Cr of 0.91).   Recent Labs  04/05/16 2351 04/06/16 0352 04/06/16 0622  GLUCAP 104* 111* 110*   Medical History: Past Medical History  Diagnosis Date  . Goiter   . Hypothyroidism   . Asthma     mild  . Hypertension   . Atypical endometrial hyperplasia   . Heart murmur   . Vitamin B12 deficiency   . Yeast infection     for last 3 weeks  . Gout   . COPD (chronic obstructive pulmonary disease) (HCC)     Insulin Requirements in the past 24 hours:  3 units of SSI resistant   Admission: 20 yof 2 weeks out from emergency colectomy/colostomy. Discharged 4/2, feculent discharge noted from midline wound when having staples removed. Noted to have necrotic stoma w/ peritonitis, fascial necrosis. Pharmacy consulted to start TPN due to expected prolonged ileus.   Assessment: 4-7 Patient in the surgical ICU post-op on 4/6. No muscle or subcutaneous fat depletion noticed per RD assessment. 4-9 Triglycerides 48 on propofol this AM 4-13 TG 49 on 4/12; propofol resumed post-operatively 4/12 and running at 40 mcg/kg/min.   Surgeries/Procedures: ~2 wks pta: emergency colectomy/colostomy 4/6: colostomy removal and end stapling of stoma, partial colectomy, ex-lap, omentectomy, wound debridement 4/7: lap, VAC change, possible debridement 4/10: Re-exploration of abdomen, Wound VAC change 4/12: Re-exploration of abdomen, placement of abdominal wall closure device 4/13: Trach placed   GI: s/p Hartmann's procedure ~2wks ago. Pre-alb down to 3.2. Albumin is low at 1.1. NG output 0 yesterday, Colostomy output 42m.  Trickle feeds to continue until off pressors.  Endo: No DM hx. CBGs controlled (78-120) on SSI. hypothyroidism - synthroid Lytes:Na 134 (trend up), K 5 (goal >/=4 w/ ileus), coCa~9.52, Mag 1.9 (goal >/=2 w/ ileus) phos ok at 3.6 Renal: SCr bump to 0.91-monitor, normalized CrCl ~40m/min. UOP down [facility] 0.5 ml[facility]kg[facility]  Pulm: COPD. Vent post-op. Trach placed 4/13- FiO2 40% on vent. Thick, moderate secretions. Cards: Weaning norepinephrine down to 6 mcg/min, HR 60-70s (SB-NSR)- improved off propofol.  Hepatobil: LFTs/alk phos/tbili wnl, TG down to 49 Neuro: Fentanyl gtt @ 375, Versed @ 2, off Propofol due to bradycardia. RASS -2 (at goal), GCS 13. CPOT 0 currently.  ID: 10d of Cipro/Flagyl for intra-abdominal infxn. Also on vancomycin. Afebrile, WBC trending down. Coagulase negative staph in blood  and urine.  Heme: Hgb drop s/p trach (9.3>>7.2), plts wnl Best Practices: scds, lovenox 459mppx, ppi iv  TPN Access: PICC 03/29/16>> TPN start date: 03/29/16  Current Nutrition:  Vital HP @ 10 ml/hr - remaining at this rate until off pressors Clinimix E 5/15 at 110 ml/hr (provides 132g protein (90%) and 1874 kcals per day (100%)  *Off Propofol [received propofol x7 days including yesterday when running at 36.52m61mr for ~17 hrs (providing ~686 kcals or 76g) & prior to this averaged 8.2 to 12.3ml26m x7 days or more (providng~216 to 325 kcals/day or ~24-36g/day)] >>this equates to >100g lipids per week and should be sufficient to prevent fatty acid deficiency; will recheck lipids on Monday if to continue.   Nutritional Goals: (per RD recs 4/10) Kcal: [id number],  Protein: 145-155 g   Plan:  -Change Clinimix to 5/15 (No lytes today) at 110 ml/hr - *removing lytes due to rising K without other sources, bump in SCr, and decline in UOP.  -Continue to hold lipids due patient having received >100g/week of lipids from propofol this week *Will consider doing MWF to help try and meet lower Kcal goal better if continuing TPN.  -Will not be able to meet protein and kcal needs with Clinimix - currently providing 90% protein and 100% kcal needs -Continue MVI and TE in TPN -Continue moderate SSI and adjust as needed -Monitor TPN labs, renal function panel daily, K closely -Follow-up toleration of trickle feeds, pressor requirements and ability to advance.  -Give Magnesium 2g IV x1 today -No electrolytes in TNA today - will recheck electrolytes at 1800 PM and replace outside of bag if needed  JessSloan LeiterarmD, BCPS Clinical Pharmacist [phone] 7:32 AM

## 2016-04-06 NOTE — Progress Notes (Signed)
2 Days Post-Op  Subjective: Pt with no acute events overnight.  Objective: Vital signs in last 24 hours: Temp:  [97.7 F (36.5 C)-98.8 F (37.1 C)] 97.8 F (36.6 C) (04/14 0920) Pulse Rate:  [43-102] 66 (04/14 0800) Resp:  [11-27] 20 (04/14 0800) BP: (120-127)/(48-55) 127/55 mmHg (04/14 0734) SpO2:  [94 %-100 %] 100 % (04/14 0800) Arterial Line BP: (99-116)/(52-55) 116/52 mmHg (04/14 0500) FiO2 (%):  [40 %] 40 % (04/14 0739) Weight:  [160.12 kg (353 lb)] 160.12 kg (353 lb) (04/14 0420) Last BM Date:  (UTA; pt intubated; fresh colostomy w/minimal serosanguinous )  Intake/Output from previous day: 04/13 0701 - 04/14 0700 In: 4720.3 [I.V.:1370.3; NG/GT:220; IV Piggyback:1700; TPN:1430] Out: 2635 [Urine:1735; Drains:900] Intake/Output this shift: Total I/O In: 176.1 [I.V.:56.1; NG/GT:10; TPN:110] Out: 50 [Urine:50]  General appearance: alert Cardio: regular rate and rhythm, S1, S2 normal, no murmur, click, rub or gallop GI: soft, ABRA in place, ostomy pink,   Lab Results:   Recent Labs  04/05/16 0547 04/06/16 0444  WBC 17.7* 11.4*  HGB 9.3* 7.2*  HCT 30.8* 25.3*  PLT 425* 323   BMET  Recent Labs  04/05/16 0547 04/06/16 0530  NA 134* 134*  K 4.5 5.0  CL 105 106  CO2 22 23  GLUCOSE 141* 120*  BUN 18 23*  CREATININE 0.73 0.91  CALCIUM 7.2* 7.2*   PT/INR No results for input(s): LABPROT, INR in the last 72 hours. ABG  Recent Labs  04/05/16 0453 04/06/16 0339  PHART 7.276* 7.302*  HCO3 24.9* 22.4    Studies/Results: Dg Abd 1 View  04/05/2016  CLINICAL DATA:  Attempted advancement of feeding tube EXAM: ABDOMEN - 1 VIEW COMPARISON:  Earlier abdominal radiograph 04/05/2016 FINDINGS: Feeding tube is coiled in the proximal stomach with tip projecting over mid stomach. Injected contrast material opacifies the gastric fundus. The feeding tube was unable to be placed beyond the pylorus. IMPRESSION: Feeding tube coiled in stomach. Electronically Signed   By: Lavonia Dana M.D.   On: 04/05/2016 18:51   Dg Abd 1 View  04/05/2016  CLINICAL DATA:  Encounter for feeding tube placement. EXAM: ABDOMEN - 1 VIEW COMPARISON:  CT, 03/28/2016 FINDINGS: Enteric feeding tube passes below the diaphragm. Tip projects in the left mid abdomen, consistent with the metallic tip being in the distal stomach. Normal bowel gas pattern. Surgical vascular clips in the right upper quadrant from a prior cholecystectomy. There are skin staples in the left upper abdomen. IMPRESSION: Enteric feeding tube tip projects in the distal stomach. Electronically Signed   By: Lajean Manes M.D.   On: 04/05/2016 09:31   Dg Chest Port 1 View  04/06/2016  CLINICAL DATA:  Acute respiratory failure. EXAM: PORTABLE CHEST 1 VIEW COMPARISON:  04/05/2016. FINDINGS: Tracheostomy tube, feeding tube, right PICC line in stable position. Cardiomegaly with normal pulmonary vascularity. Mild bibasilar atelectasis. Mild infiltrate left lung base cannot be excluded. No pleural effusion or pneumothorax IMPRESSION: One lines and tubes in stable position. 1. Low lung volumes with mild bibasilar atelectasis. Mild infiltrate left lung base cannot be excluded Electronically Signed   By: Harlan   On: 04/06/2016 07:55   Dg Chest Port 1 View  04/05/2016  CLINICAL DATA:  Tracheostomy EXAM: PORTABLE CHEST 1 VIEW COMPARISON:  04/05/2016 and 03/31/2016 FINDINGS: Cardiomediastinal silhouette is stable. Endotracheal tube has been removed. New tracheostomy tube in place. There is NG feeding tube in place. Persistent mild basilar atelectasis. Central mild vascular congestion without convincing pulmonary edema.  There is no evidence of pneumothorax. IMPRESSION: New tracheostomy tube in place. There is NG feeding tube in place. Persistent mild basilar atelectasis. Central mild vascular congestion without convincing pulmonary edema. There is no evidence Electronically Signed   By: Lahoma Crocker M.D.   On: 04/05/2016 11:42   Dg Chest  Port 1 View  04/05/2016  CLINICAL DATA:  Intubated. EXAM: PORTABLE CHEST 1 VIEW COMPARISON:  03/31/2016 FINDINGS: The endotracheal tube remains in place and terminates approximately 2.5 cm above the carina. Enteric tube courses into the left upper abdomen with tip not imaged. Right PICC terminates over the lower SVC. Cardiomediastinal silhouette is unchanged. There is mild bibasilar atelectasis, with improved aeration of the right lung base. No sizable pleural effusion or pneumothorax is identified. IMPRESSION: Mild bibasilar atelectasis with improved aeration of the right base. Electronically Signed   By: Logan Bores M.D.   On: 04/05/2016 07:45   Dg Addison Bailey G Tube Plc W/fl-no Rad  04/05/2016  CLINICAL DATA:  NASO G TUBE PLACEMENT WITH FLUORO Fluoroscopy was utilized by the requesting physician.  No radiographic interpretation.    Anti-infectives: Anti-infectives    Start     Dose/Rate Route Frequency Ordered Stop   04/03/16 2100  vancomycin (VANCOCIN) 1,500 mg in sodium chloride 0.9 % 500 mL IVPB     1,500 mg 250 mL/hr over 120 Minutes Intravenous Every 12 hours 04/02/16 2020     04/02/16 2030  vancomycin (VANCOCIN) 2,500 mg in sodium chloride 0.9 % 500 mL IVPB     2,500 mg 250 mL/hr over 120 Minutes Intravenous  Once 04/02/16 2020 04/02/16 2306   03/29/16 1200  ciprofloxacin (CIPRO) IVPB 400 mg     400 mg 200 mL/hr over 60 Minutes Intravenous Every 12 hours 03/29/16 0213     03/29/16 0600  [MAR Hold]  ciprofloxacin (CIPRO) IVPB 400 mg     (MAR Hold since 03/28/16 2357)   400 mg 200 mL/hr over 60 Minutes Intravenous On call to O.R. 03/28/16 2306 03/28/16 2355   03/29/16 0600  [MAR Hold]  metroNIDAZOLE (FLAGYL) IVPB 500 mg     (MAR Hold since 03/28/16 2357)   500 mg 100 mL/hr over 60 Minutes Intravenous On call to O.R. 03/28/16 2306 03/29/16 0025   03/29/16 0600  metroNIDAZOLE (FLAGYL) IVPB 500 mg     500 mg 100 mL/hr over 60 Minutes Intravenous Every 8 hours 03/29/16 0213     03/28/16 2300   ertapenem (INVANZ) 1 g in sodium chloride 0.9 % 50 mL IVPB  Status:  Discontinued     1 g 100 mL/hr over 30 Minutes Intravenous  Once 03/28/16 2259 03/28/16 2306      Assessment/Plan: s/p Procedure(s): EXPLORATORY LAPAROTOMY, PLACEMENT OF ABRA ABDOMINAL WALL CLOSURE SET  (N/A) Con't TF at trickle until off pressors than adv Will plan on changing ABRA later this morning Stop abx today Wean pressors as possible  LOS: 9 days    Rosario Jacks., [facility] 04/06/2016

## 2016-04-06 NOTE — Progress Notes (Addendum)
Dr. Rosendo Gros paged and aware of two top loose buttons of ABRA closure system that have broken completely. Will assess at bedside shortly. Day nurse aware during report.

## 2016-04-06 NOTE — Progress Notes (Signed)
Pt very dysynchronous with ventilator. RT adjusted I-time with no changes in pt's comfort. Dr Lamonte Sakai was walking through unit and was in agreement to try Pressure Control Mode with a Pressure of 10, Peep of 5, for a total pressure of 15, Rate of 12, and 40%. I-time remains at 0.9. Pt tolerating at this time. RN aware of changes. RT will continue to monitor.

## 2016-04-06 NOTE — Op Note (Signed)
Pre Operative Diagnosis:  Open abdomen, ABRA  Post Operative Diagnosis: Same  Procedure: VAC change, myofascial advancement  Surgeon: Dr. Ralene Ok  Assistant: Dr. Dalbert Batman  Anesthesia: Sedation  EBL: Minimal  Complications: None  Findings:  Fascial gap approximately 8-10 cm, the last murmurs tightened by approximately 2 times.  Indications for procedure:  Patient is a 60 year old female with a placement of ABRA for an open abdomen. Patient required sponge exchange.  Details of the procedure:  Patient was prepped and draped in the standard fashion. The previous elastomers were unhitched. These were tightened by approximately 1 hashmark on each side. The elastomers were reattached. The sponge was placed. The plastic sheaths were placed over the sponge and suction was restarted. There was a good seal. It should be noted that the most superior elastomers had popped. This was attempted to be replaced however could not. At this time the fascial massage took place.  Patient on the procedure well.

## 2016-04-07 ENCOUNTER — Inpatient Hospital Stay (HOSPITAL_COMMUNITY): Payer: BLUE CROSS/BLUE SHIELD

## 2016-04-07 LAB — POCT I-STAT 3, VENOUS BLOOD GAS (G3P V)
ACID-BASE DEFICIT: 3 mmol/L — AB (ref 0.0–2.0)
Bicarbonate: 23 mEq/L (ref 20.0–24.0)
O2 SAT: 80 %
PH VEN: 7.331 — AB (ref 7.250–7.300)
Patient temperature: 98.5
TCO2: 24 mmol/L (ref 0–100)
pCO2, Ven: 43.6 mmHg — ABNORMAL LOW (ref 45.0–50.0)
pO2, Ven: 48 mmHg — ABNORMAL HIGH (ref 31.0–45.0)

## 2016-04-07 LAB — POCT I-STAT 3, ART BLOOD GAS (G3+)
ACID-BASE DEFICIT: 2 mmol/L (ref 0.0–2.0)
ACID-BASE DEFICIT: 3 mmol/L — AB (ref 0.0–2.0)
Bicarbonate: 26.6 mEq/L — ABNORMAL HIGH (ref 20.0–24.0)
Bicarbonate: 24.4 mEq/L — ABNORMAL HIGH (ref 20.0–24.0)
O2 SAT: 94 %
O2 SAT: 98 %
PCO2 ART: 60 mmHg — AB (ref 35.0–45.0)
PH ART: 7.253 — AB (ref 7.350–7.450)
PH ART: 7.271 — AB (ref 7.350–7.450)
Patient temperature: 36.8
TCO2: 28 mmol/L (ref 0–100)
TCO2: 26 mmol/L (ref 0–100)
pCO2 arterial: 52.9 mmHg — ABNORMAL HIGH (ref 35.0–45.0)
pO2, Arterial: 85 mmHg (ref 80.0–100.0)
pO2, Arterial: 118 mmHg — ABNORMAL HIGH (ref 80.0–100.0)

## 2016-04-07 LAB — GLUCOSE, CAPILLARY
GLUCOSE-CAPILLARY: 104 mg/dL — AB (ref 65–99)
GLUCOSE-CAPILLARY: 111 mg/dL — AB (ref 65–99)
GLUCOSE-CAPILLARY: 81 mg/dL (ref 65–99)

## 2016-04-07 LAB — RENAL FUNCTION PANEL
Albumin: 1.2 g/dL — ABNORMAL LOW (ref 3.5–5.0)
Anion gap: 5 (ref 5–15)
BUN: 24 mg/dL — AB (ref 6–20)
CHLORIDE: 107 mmol/L (ref 101–111)
CO2: 23 mmol/L (ref 22–32)
Calcium: 7.6 mg/dL — ABNORMAL LOW (ref 8.9–10.3)
Creatinine, Ser: 0.8 mg/dL (ref 0.44–1.00)
GFR calc Af Amer: >60 mL/min (ref >60)
GLUCOSE: 123 mg/dL — AB (ref 65–99)
PHOSPHORUS: 3.2 mg/dL (ref 2.5–4.6)
Potassium: 4.9 mmol/L (ref 3.5–5.1)
Sodium: 135 mmol/L (ref 135–145)

## 2016-04-07 LAB — CBC WITH DIFFERENTIAL/PLATELET
BASOS ABS: 0 10*3/uL (ref 0.0–0.1)
BASOS PCT: 0 %
Eosinophils Absolute: 0.1 10*3/uL (ref 0.0–0.7)
Eosinophils Relative: 1 %
HEMATOCRIT: 26.5 % — AB (ref 36.0–46.0)
Hemoglobin: 8.4 g/dL — ABNORMAL LOW (ref 12.0–15.0)
Lymphocytes Relative: 13 %
Lymphs Abs: 1.7 10*3/uL (ref 0.7–4.0)
MCH: 30.3 pg (ref 26.0–34.0)
MCHC: 31.7 g/dL (ref 30.0–36.0)
MCV: 95.7 fL (ref 78.0–100.0)
MONO ABS: 1.1 10*3/uL — AB (ref 0.1–1.0)
Monocytes Relative: 9 %
NEUTROS ABS: 9.5 10*3/uL — AB (ref 1.7–7.7)
NEUTROS PCT: 77 %
PLATELETS: 376 10*3/uL (ref 150–400)
RBC: 2.77 MIL/uL — ABNORMAL LOW (ref 3.87–5.11)
RDW: 16.2 % — AB (ref 11.5–15.5)
WBC: 12.4 10*3/uL — AB (ref 4.0–10.5)

## 2016-04-07 LAB — TRIGLYCERIDES: TRIGLYCERIDES: 53 mg/dL (ref <150)

## 2016-04-07 LAB — PROCALCITONIN: Procalcitonin: 0.2 ng/mL

## 2016-04-07 LAB — MAGNESIUM: MAGNESIUM: 2.2 mg/dL (ref 1.7–2.4)

## 2016-04-07 MED ORDER — M.V.I. ADULT IV INJ
INJECTION | INTRAVENOUS | Status: AC
Start: 2016-04-07 — End: 2016-04-08
  Administered 2016-04-07: 18:00:00 via INTRAVENOUS
  Filled 2016-04-07: qty 2400

## 2016-04-07 MED ORDER — PRO-STAT SUGAR FREE PO LIQD
60.0000 mL | Freq: Two times a day (BID) | ORAL | Status: DC
  Administered 2016-04-07 – 2016-04-15 (×6): 60 mL
  Filled 2016-04-07 (×7): qty 60

## 2016-04-07 MED ORDER — VITAL HIGH PROTEIN PO LIQD
1000.0000 mL | ORAL | Status: DC
  Administered 2016-04-07: 1000 mL
  Administered 2016-04-13 (×2)
  Administered 2016-04-13: 1000 mL
  Administered 2016-04-13: 16:00:00
  Administered 2016-04-13: 1000 mL
  Administered 2016-04-13 – 2016-04-14 (×4)
  Administered 2016-04-14: 1000 mL
  Administered 2016-04-14: 03:00:00
  Administered 2016-04-15: 1000 mL

## 2016-04-07 MED ORDER — FUROSEMIDE 10 MG/ML IJ SOLN
40.0000 mg | Freq: Four times a day (QID) | INTRAMUSCULAR | Status: AC
  Administered 2016-04-07 (×2): 40 mg via INTRAVENOUS
  Filled 2016-04-07 (×2): qty 4

## 2016-04-07 NOTE — Progress Notes (Signed)
PARENTERAL NUTRITION CONSULT NOTE - Follow Up  Pharmacy Consult for TPN Indication: Intolerance to enteral feeds (Expected prolonged ileus)  Allergies  Allergen Reactions  . Penicillins Anaphylaxis, Hives, Shortness Of Breath, Swelling and Other (See Comments)    Angioedema   Patient Measurements: Height: _0  (167.6 cm) Weight: (!) 339 lb 8.1 oz (154 kg) IBW/kg (Calculated) : 59.3  IBW: ~59.3kg Adjusted Body Weight: 82.4kg  Vital Signs: Temp: 98.2 F (36.8 C) (04/15 0338) Temp Source: Oral (04/15 0338) BP: 120/53 mmHg (04/15 0350) Pulse Rate: 59 (04/15 0715) Intake/Output from previous day: 04/14 0701 - 04/15 0700 In: 4920.2 [I.V.:1490.2; NG/GT:240; IV Piggyback:550; TPN:2640] Out: 2500 [Urine:2100; Drains:400] Intake/Output from this shift:   Labs:  Recent Labs  04/05/16 0547 04/06/16 0444 04/07/16 0210  WBC 17.7* 11.4* 12.4*  HGB 9.3* 7.2* 8.4*  HCT 30.8* 25.3* 26.5*  PLT 425* 323 376    Recent Labs  04/05/16 0547 04/06/16 0444 04/06/16 0530 04/06/16 1724 04/07/16 0210  NA 134*  --  134* 135 135  K 4.5  --  5.0 5.1 4.9  CL 105  --  106 107 107  CO2 22  --  _1 GLUCOSE 141*  --  120* 110* 123*  BUN 18  --  23* 23* 24*  CREATININE 0.73  --  0.91 0.82 0.80  CALCIUM 7.2*  --  7.2* 7.6* 7.6*  MG 2.1 1.9  --   --  2.2  PHOS 3.1  --  3.6  --  3.2  PROT 4.3*  --   --   --   --   ALBUMIN 1.1*  --  1.1*  --  1.2*  AST 32  --   --   --   --   ALT 15  --   --   --   --   ALKPHOS 46  --   --   --   --   BILITOT 0.3  --   --   --   --   TRIG  --   --   --   --  53   Estimated Creatinine Clearance: 111.9 mL/min (by C-G formula based on Cr of 0.8).   Recent Labs  04/06/16 0918 04/06/16 1231 04/06/16 1721  GLUCAP 89 91 85   Medical History: Past Medical History  Diagnosis Date  . Goiter   . Hypothyroidism   . Asthma     mild  . Hypertension   . Atypical endometrial hyperplasia   . Heart murmur   . Vitamin B12 deficiency   . Yeast  infection     for last 3 weeks  . Gout   . COPD (chronic obstructive pulmonary disease) (HCC)    Insulin Requirements in the past 24 hours:  0 units of SSI  Admission: 47 yof 2 weeks out from emergency colectomy/colostomy. Discharged 4/2, feculent discharge noted from midline wound when having staples removed. Noted to have necrotic stoma w/ peritonitis, fascial necrosis. Pharmacy consulted to start TPN due to expected prolonged ileus.   Assessment: 4-7 Patient in the surgical ICU post-op on 4/6. No muscle or subcutaneous fat depletion noticed per RD assessment. 4-9 Triglycerides 48 on propofol this AM 4-13 TG 49 on 4/12; propofol resumed post-operatively 4/12 and running at 40 mcg/kg/min.   Surgeries/Procedures: ~2 wks pta: emergency colectomy/colostomy 4/6: colostomy removal and end stapling of stoma, partial colectomy, ex-lap, omentectomy, wound debridement 4/7: lap, VAC change, possible debridement 4/10: Re-exploration of abdomen, Wound VAC change  4/12: Re-exploration of abdomen, placement of abdominal wall closure device 4/13: Trach placed  4/14: Wound VAC change  GI: s/p Hartmann's procedure ~2wks ago. Pre-alb down to 3.2. Albumin is low at 1.2. NG output 0 yesterday,Drains put out 47m yesterday which is down from the day before. Trickle feeds to continue until off pressors. Surgery this am plans to advance TFs even though still on pressors. Endo: No DM hx. CBGs controlled (80s-120s) on SSI. hypothyroidism - synthroid Lytes: wnl. K 4.9 (goal >/=4 w/ ileus), coCa 9.9, Mag 2.2 (goal >/=2 w/ ileus) phos ok at 3.2 Renal: SCr stable, normalized CrCl ~870mmin. UOP ok at 0.70m35mg/hr. [facility] Vent post-op. Trach placed 4/13- FiO2 40% on vent. Thick, moderate secretions. Cards: BP ok and HR brady on Norepi back up to 12m15min.  Hepatobil: LFTs/alk phos/tbili wnl, TG remain stable at 53 Neuro: Fentanyl gtt @ 400, Versed @ 10mg69m off Propofol due to bradycardia. RASS  -2 (at goal), GCS 12. CPOT 1 currently.  ID: S/p 10 days of Cipro/Flagyl for intra-abdominal infxn. Afebrile, WBC trending down. Coagulase negative staph in blood and urine.  Heme: Hgb low at 8.4, plts wnl Best Practices: scds, lovenox 40mg 41m ppi iv  TPN Access: PICC 03/29/16>> TPN start date: 03/29/16  Current Nutrition:  Vital HP @ 10 ml/hr - remaining at this rate until off pressors Clinimix 5/15 at 110 ml/hr  TF and TPN provides 153 g of protein and 2,114 kcal per day meeting 100% of protein needs and over 100% of kcal needs  Nutritional Goals: (per RD recs 4/13) Kcal: [phone]  Protein: 145-155 g   Plan:  Decrease Clinimix 5/15 (no lytes) to 100ml/h12mntinue to hold lipids due to patient having received > 100g of lipids in the past week while on propofol  TF and TPN provides 141 g of protein and 1,944 kcal per day meeting 95% of protein needs and 100% of kcal needs *Will consider MWF lipids to better meet current nutritional goals Continue MVI and TE in TPN Continue moderate SSI and adjust as needed Monitor TPN labs, renal function panel daily, K closely Follow-up toleration of trickle feeds and ability to advance  Brieann Osinski Elenor QuinonesD, BCPS ClDuluth Surgical Suites LLCal Pharmacist Pager [phone] 8:03 AM

## 2016-04-07 NOTE — Progress Notes (Signed)
Nutrition Follow-up  DOCUMENTATION CODES:   Morbid obesity  INTERVENTION:  - Will order Vital High Protein @ 40 mL/hr with 60 mL Prostat BID (goal rate). Increase TF from 20 to 30 mL/hr at this time and then advance to goal rate of 40 mL/hr after 12 hours. Goal rate as outlined above will provide 1360 kcal, 144 grams of protein (97% minimum estimated protein needs), and 802 mL free water. - Free water flush per MD/NP/PA - TPN/TPN weaning per pharmacy - RD will continue to monitor for adjustment of nutrition support   NUTRITION DIAGNOSIS:   Inadequate oral intake related to inability to eat as evidenced by NPO status. -ongoing  GOAL:   Provide needs based on ASPEN/SCCM guidelines -greatly exceeding with current nutrition support regimen  MONITOR:   Vent status, TF tolerance, Weight trends, Labs, Skin, I & O's, Other (Comment) (TPN regimen)  REASON FOR ASSESSMENT:   Consult Enteral/tube feeding initiation and management (advance TF to goal rate)  ASSESSMENT:   60 y.o. Female presented with postoperative complication. 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 She went to a nearby hospital and was diagnosed with perforated diverticulitis. She underwent an exploratory laparotomy with sigmoid colon resection on March 20. She was discharged from the hospital there after a week and just returned home to [place] 5 days ago. Yesterday she reports developing leakage of fecal matter from her midline surgical incision site. She continues to have normal ostomy output from her left lower quadrant stoma. Over the last 24 hours she has also experienced increased generalized abdominal pain  4/15 New consult for advancement of TF rate and plan to start weaning TPN if this is successful. Pt remains intubated with CORTRAK tube in place. No family/visitors present at this time. Nutrition needs updated since previous  assessment based on current weight status and in accordance with ASPEN guidelines for morbidly obese, critically ill pt. Weight fluctuations since admission: 300-353 lbs; will continue to monitor weight trends and need for adjustment concerning estimated nutrition needs.  Patient is currently intubated on ventilator support via trach collar with NGT in place. MV: 10.6 L/min Temp (24hrs), Avg:98 F (36.7 C), Min:97.4 F (36.3 C), Max:98.2 F (36.8 C) Propofol: none   Per pulmonology note 4/13 concerning feeding tube: Earlier abdominal radiograph 04/05/2016 FINDINGS: Feeding tube is coiled in the proximal stomach with tip projecting over mid stomach. Injected contrast material opacifies the gastric fundus. The feeding tube was unable to be placed beyond the pylorus.  Pt currently receiving Vital High Protein @ 20 mL/hr with 30 mL free water every 4 hours which is providing 480 kcal, 42 grams of protein, and 521 mL free water. She is also receiving Clinimix E 5/15 @ 110 mL/hr without lipids, per ICU protocol, which is providing 1874 kcal, 132 grams protein. Current nutrition support regimen providing total of  2354 kcal, 174 grams protein which greatly exceeds estimated needs.   Per pharmacy note today at [phone]: plan to decrease TPN to Clinimix E 5/15 @ 100 mL/hr without lipids to provide 1704 kcal, 120 grams of protein. Will adjust TF as outlined above and monitor for tolerance and ability for continued TPN wean.   Medications reviewed. Labs reviewed; BUN: 24 mg/dL, Ca: 7.6 mg/dL. Drips: Levo @ 11.73 mcg/min, Fentanyl @ 400 mcg/hr.     4/13 Patient s/p procedure 4/6: COLOSTOMY REMOVAL AND END STAPLING OF STOMA PARTIAL COLECTOMY EXPLORATORY LAPAROTOMY OMENTECTOMY  DEBRIDEMENT WOUND  Patient is currently intubated on ventilator support -- NGT to LIS Plan to discontinue Propofol and start Versed.  Patient is receiving TPN with Clinimix E 5/15 @ 110 ml/hr. No lipids at this time. TPN  providing 1874 kcal and 132 grams protein per day. Meets 100% minimum estimated energy needs and 90% minimum estimated protein needs.  Plan to start trickle feeding (Vital HP formula). CORTRAK tube in place. Tip in stomach at this time. IR to try and advance post-pyloric.   Diet Order:  Diet NPO time specified TPN (CLINIMIX) Adult without lytes TPN (CLINIMIX) Adult without lytes  Skin:  Wound (see comment) (abdominal wound VAC)  Last BM:  N/A  Height:   Ht Readings from Last 1 Encounters:  04/07/16 5\' 6"  (1.676 m)    Weight:   Wt Readings from Last 1 Encounters:  04/07/16 339 lb 8.1 oz (154 kg)    Ideal Body Weight:  59 kg  BMI:  Body mass index is 54.82 kg/(m^2).  Estimated Nutritional Needs:   Kcal:  [id number] (22-25 kcal/kg IBW)  Protein:  148 grams (2.5 grams/kg IBW)  Fluid:  per MD/NP/PA  EDUCATION NEEDS:   No education needs identified at this time     Jarome Matin, RD, LDN Inpatient Clinical Dietitian Pager # [phone] After hours/weekend pager # [phone]

## 2016-04-07 NOTE — Progress Notes (Signed)
RRT changed vent settings per MD Deterding to [facility] 40/5/18 at 04:30. Will monitor pt's sedation gtt and collect f/u ABG at 05:30.  Henreitta Leber, RN 5:07 AM 04/07/2016

## 2016-04-07 NOTE — Procedures (Signed)
Arterial Catheter Insertion Procedure Note [name] [id number] 08-Jul-1956  Procedure: Insertion of Arterial Catheter  Indications: Blood pressure monitoring and Frequent blood sampling  Procedure Details Consent: Unable to obtain consent because of emergent medical necessity. Time Out: Verified patient identification, verified procedure, site/side was marked, verified correct patient position, special equipment/implants available, medications/allergies/relevent history reviewed, required imaging and test results available.  Performed  Maximum sterile technique was used including antiseptics, cap, gloves, gown, hand hygiene, mask and sheet. Skin prep: Chlorhexidine; local anesthetic administered 20 gauge catheter was inserted into right femoral artery using the Seldinger technique.  Evaluation Blood flow good; BP tracing good. Complications: No apparent complications.   Richardson Landry Minor ACNP Maryanna Shape PCCM Pager [phone] till 3 pm If no answer page [phone] 04/07/2016, 2:39 PM

## 2016-04-07 NOTE — Progress Notes (Signed)
eLink Physician-Brief Progress Note Patient Name: Christina Castaneda DOB: 01-08-1956 MRN: [id number]   Date of Service  04/07/2016  HPI/Events of Note  Patient with resp acidosis following change in vent settings from [employer] to PCV along with change in sedation.     eICU Interventions  Plan: Change back to [facility] Continue to increase versed for patient comfort and vent synchrony Recheck ABG in 1 hour post vent change      Intervention Category Major Interventions: Acid-Base disturbance - evaluation and management  DETERDING,ELIZABETH 04/07/2016, 4:14 AM

## 2016-04-07 NOTE — Progress Notes (Signed)
PULMONARY / CRITICAL CARE MEDICINE   Name: Christina Castaneda MRN: [id number] DOB: 08-11-1956    ADMISSION DATE:  03/28/2016 CONSULTATION DATE:  03/29/2016  REFERRING MD:  CCS  CHIEF COMPLAINT:  Colostomy bag leaking  SUBJECTIVE:  Remains on sedation, pressors.  VITAL SIGNS: BP 121/49 mmHg  Pulse 60  Temp(Src) 98.2 F (36.8 C) (Axillary)  Resp 22  Ht 5\' 6"  (1.676 m)  Wt 339 lb 8.1 oz (154 kg)  BMI 54.82 kg/m2  SpO2 100%  LMP 11/18/2012  HEMODYNAMICS: CVP:  [6 mmHg-16 mmHg] 6 mmHg  VENTILATOR SETTINGS: Vent Mode:  [-] PCV FiO2 (%):  [40 %] 40 % Set Rate:  [12 bmp-18 bmp] 12 bmp Vt Set:  [480 mL] 480 mL PEEP:  [5 cmH20] 5 cmH20 Plateau Pressure:  [14 cmH20-17 cmH20] 14 cmH20  INTAKE / OUTPUT: I/O last 3 completed shifts: In: 7989.9 [I.V.:2129.9; NG/GT:450; IV Piggyback:1450] Out: [id number] [Urine:3010; Drains:950]  PHYSICAL EXAMINATION: General: sedated Neuro: RASS -2 HEENT: trach site clean Cardiac: regular Chest: b/l wheeze Abd: wound vac Ext: 2+ edema Skin: no rashes  LABS:  BMET  Recent Labs Lab 04/06/16 0530 04/06/16 1724 04/07/16 0210  NA 134* 135 135  K 5.0 5.1 4.9  CL 106 107 107  CO2 23 24 23   BUN 23* 23* 24*  CREATININE 0.91 0.82 0.80  GLUCOSE 120* 110* 123*   Electrolytes  Recent Labs Lab 04/05/16 0547 04/06/16 0444 04/06/16 0530 04/06/16 1724 04/07/16 0210  CALCIUM 7.2*  --  7.2* 7.6* 7.6*  MG 2.1 1.9  --   --  2.2  PHOS 3.1  --  3.6  --  3.2   CBC  Recent Labs Lab 04/05/16 0547 04/06/16 0444 04/07/16 0210  WBC 17.7* 11.4* 12.4*  HGB 9.3* 7.2* 8.4*  HCT 30.8* 25.3* 26.5*  PLT 425* 323 376   Coag's No results for input(s): APTT, INR in the last 168 hours.  Sepsis Markers  Recent Labs Lab 04/05/16 1022 04/06/16 0444 04/07/16 0210  PROCALCITON 0.31 0.26 0.20   ABG  Recent Labs Lab 04/06/16 0339 04/07/16 0401 04/07/16 0540  PHART 7.302* 7.253* 7.271*  PCO2ART 46.5* 60.0* 52.9*  PO2ART 115* 85.0 118.0*    Liver Enzymes  Recent Labs Lab 04/02/16 0349  04/05/16 0547 04/06/16 0530 04/07/16 0210  AST 17  --  32  --   --   ALT 11*  --  15  --   --   ALKPHOS 34*  --  46  --   --   BILITOT 0.4  --  0.3  --   --   ALBUMIN 1.2*  < > 1.1* 1.1* 1.2*  < > = values in this interval not displayed.  Cardiac Enzymes No results for input(s): TROPONINI, PROBNP in the last 168 hours.  Glucose  Recent Labs Lab 04/05/16 2351 04/06/16 0352 04/06/16 0622 04/06/16 0918 04/06/16 1231 04/06/16 1721  GLUCAP 104* 111* 110* 89 91 85   Imaging No results found.  STUDIES:    MICROBIOLOGY: Blood Ctx x2 4/9>>> Coag neg staph Urine Ctx 4/9>>> Coag neg staph Tracheal Asp Ctx 4/9>>> yeast Blood Ctx x2 4/5>>> negative Urine Ctx 4/5:  Multiple species present MRSA PCR 4/6:  Negative   ANTIBIOTICS: Cipro 4/5>>> Flagyl 4/5>>> Vanco 4/11 >>  SIGNIFICANT EVENTS: 3/20 Exp lap appendectomy,removal sigmoid colon, bowel repair in setting diverticulitis. [facility] 4/6 Exp Lap , colostomy removal, partial colectomy,omentectom, wound debridement, open wound with wound vac. 4/10 Start TPN  LINES/TUBES: ETT 4/6>>> 4/13  R PICC 4/6>>> L RADIAL ART LINE 4/7>>> #6 cuffed Trach Hyman Bible) 4/13 >>  DISCUSSION: 60 yo female had diverticulitis with sigmoid colon resection, appendectomy at [facility] on 3/20 and required colostomy.  Noted to have feculent material from midline wound and present to ER.  ASSESSMENT / PLAN:  PULMONARY A: Compromised airway. 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 Hx of COPD with asthma, OSA >> wheezing noted 4/14. Tobacco abuse. P:   Continue full vent support >> no weaning until okay with surgery F/u CXR, ABG Brovana, pulmicort with prn albuterol  CARDIOVASCULAR A:  Septic shock. Hx of HTN. Hypervolemia. P:  Lasix 40 mg x 2 on 4/15, and pressors to keep MAP > 65 F/u Echo  RENAL A:   AKI >> resolved. P:   Monitor renal fx, urine outpt  GASTROINTESTINAL A:   Peritonitis s/p  laparotomy. P:   Post-op care, TPN per CCS Protonix for SUP  HEMATOLOGIC A:   Anemia of critical illness. P:  F/u CBC Transfuse for Hb < 7 Lovenox for DVT prevention  INFECTIOUS A:   Septic shock 2nd to peritonitis and cellulitis of ostomy site. Coag neg Staph bacteremia, UTI. Hx of PCN allergy. P:   Day 11 cipro, flagyl for peritonitis Day 6 vancomycin for Coag neg staph  ENDOCRINE A:   Hx of hypothyroidism. P:   Synthroid IV  NEUROLOGIC A:   Acute metabolic encephalopathy. Bradycardia from diprivan. P:   RASS goal: -2   CC time 34 minutes.  Chesley Mires, MD [facility] Pulmonary/Critical Care 04/07/2016, 9:04 AM Pager:  [phone] After 3pm call: [phone]

## 2016-04-07 NOTE — Progress Notes (Signed)
3 Days Post-Op  Subjective: dysynchrony with vent now better, on levophed still, sedated  Objective: Vital signs in last 24 hours: Temp:  [97.4 F (36.3 C)-98.2 F (36.8 C)] 98.2 F (36.8 C) (04/15 0338) Pulse Rate:  [53-78] 60 (04/15 0749) Resp:  [10-30] 22 (04/15 0749) BP: (116-141)/(49-59) 121/49 mmHg (04/15 0749) SpO2:  [99 %-100 %] 100 % (04/15 0752) FiO2 (%):  [40 %] 40 % (04/15 0749) Weight:  [154 kg (339 lb 8.1 oz)] 154 kg (339 lb 8.1 oz) (04/15 0600) Last BM Date:  (UTA; pt intubated; fresh colostomy w/minimal serosanguinous )  Intake/Output from previous day: 04/14 0701 - 04/15 0700 In: 4920.2 [I.V.:1490.2; NG/GT:240; IV Piggyback:550; TPN:2640] Out: 2500 [Urine:2100; Drains:400] Intake/Output this shift:    Resp: coarse bilateral breath sounds Cardio: regular rate and rhythm GI: vac and elastomers in place, ostomy pink  Lab Results:   Recent Labs  04/06/16 0444 04/07/16 0210  WBC 11.4* 12.4*  HGB 7.2* 8.4*  HCT 25.3* 26.5*  PLT 323 376   BMET  Recent Labs  04/06/16 1724 04/07/16 0210  NA 135 135  K 5.1 4.9  CL 107 107  CO2 24 23  GLUCOSE 110* 123*  BUN 23* 24*  CREATININE 0.82 0.80  CALCIUM 7.6* 7.6*   PT/INR No results for input(s): LABPROT, INR in the last 72 hours. ABG  Recent Labs  04/07/16 0401 04/07/16 0540  PHART 7.253* 7.271*  HCO3 26.6* 24.4*    Studies/Results: Dg Abd 1 View  04/05/2016  CLINICAL DATA:  Attempted advancement of feeding tube EXAM: ABDOMEN - 1 VIEW COMPARISON:  Earlier abdominal radiograph 04/05/2016 FINDINGS: Feeding tube is coiled in the proximal stomach with tip projecting over mid stomach. Injected contrast material opacifies the gastric fundus. The feeding tube was unable to be placed beyond the pylorus. IMPRESSION: Feeding tube coiled in stomach. Electronically Signed   By: Lavonia Dana M.D.   On: 04/05/2016 18:51   Dg Abd 1 View  04/05/2016  CLINICAL DATA:  Encounter for feeding tube placement. EXAM:  ABDOMEN - 1 VIEW COMPARISON:  CT, 03/28/2016 FINDINGS: Enteric feeding tube passes below the diaphragm. Tip projects in the left mid abdomen, consistent with the metallic tip being in the distal stomach. Normal bowel gas pattern. Surgical vascular clips in the right upper quadrant from a prior cholecystectomy. There are skin staples in the left upper abdomen. IMPRESSION: Enteric feeding tube tip projects in the distal stomach. Electronically Signed   By: Lajean Manes M.D.   On: 04/05/2016 09:31   Dg Chest Port 1 View  04/06/2016  CLINICAL DATA:  Acute respiratory failure. EXAM: PORTABLE CHEST 1 VIEW COMPARISON:  04/05/2016. FINDINGS: Tracheostomy tube, feeding tube, right PICC line in stable position. Cardiomegaly with normal pulmonary vascularity. Mild bibasilar atelectasis. Mild infiltrate left lung base cannot be excluded. No pleural effusion or pneumothorax IMPRESSION: One lines and tubes in stable position. 1. Low lung volumes with mild bibasilar atelectasis. Mild infiltrate left lung base cannot be excluded Electronically Signed   By: Fords   On: 04/06/2016 07:55   Dg Chest Port 1 View  04/05/2016  CLINICAL DATA:  Tracheostomy EXAM: PORTABLE CHEST 1 VIEW COMPARISON:  04/05/2016 and 03/31/2016 FINDINGS: Cardiomediastinal silhouette is stable. Endotracheal tube has been removed. New tracheostomy tube in place. There is NG feeding tube in place. Persistent mild basilar atelectasis. Central mild vascular congestion without convincing pulmonary edema. There is no evidence of pneumothorax. IMPRESSION: New tracheostomy tube in place. There is NG feeding  tube in place. Persistent mild basilar atelectasis. Central mild vascular congestion without convincing pulmonary edema. There is no evidence Electronically Signed   By: Lahoma Crocker M.D.   On: 04/05/2016 11:42   Dg Addison Bailey G Tube Plc W/fl-no Rad  04/05/2016  CLINICAL DATA:  NASO G TUBE PLACEMENT WITH FLUORO Fluoroscopy was utilized by the requesting  physician.  No radiographic interpretation.    Anti-infectives: Anti-infectives    Start     Dose/Rate Route Frequency Ordered Stop   04/03/16 2100  vancomycin (VANCOCIN) 1,500 mg in sodium chloride 0.9 % 500 mL IVPB  Status:  Discontinued     1,500 mg 250 mL/hr over 120 Minutes Intravenous Every 12 hours 04/02/16 2020 04/06/16 0943   04/02/16 2030  vancomycin (VANCOCIN) 2,500 mg in sodium chloride 0.9 % 500 mL IVPB     2,500 mg 250 mL/hr over 120 Minutes Intravenous  Once 04/02/16 2020 04/02/16 2306   03/29/16 1200  ciprofloxacin (CIPRO) IVPB 400 mg  Status:  Discontinued     400 mg 200 mL/hr over 60 Minutes Intravenous Every 12 hours 03/29/16 0213 04/06/16 0943   03/29/16 0600  [MAR Hold]  ciprofloxacin (CIPRO) IVPB 400 mg     (MAR Hold since 03/28/16 2357)   400 mg 200 mL/hr over 60 Minutes Intravenous On call to O.R. 03/28/16 2306 03/28/16 2355   03/29/16 0600  [MAR Hold]  metroNIDAZOLE (FLAGYL) IVPB 500 mg     (MAR Hold since 03/28/16 2357)   500 mg 100 mL/hr over 60 Minutes Intravenous On call to O.R. 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 Hulen Skains, subsequent ruq stoma and washout, subsequent abra placement by Dr Redmond Pulling - change vac on Monday - will try to advance tube feeds as tolerated, if can go up will wean tna to off Hypotension-on levo, will try to wean today Respiratory-full vent support,  appreciate CCM assistance, better again after going back to pc VTE prophylaxis-SCD/lovenox Neuro-propofol and fentanyl for sedation FEN-tna PCM-TPN started 4/6.advance tube feeds Dispo-ICU  Abiageal Blowe 04/07/2016

## 2016-04-07 NOTE — Progress Notes (Signed)
CRITICAL VALUE ALERT  Critical value received:  PCO2 on ABG = 60.0  Date of notification:  04/07/2016  Time of notification:  04:01  Critical value read back: n/a, RN did test via iSTAT  Nurse who received alert:  Paris Lore, RN  MD notified:  Cornish Warren Lacy)  Time of first page:  04:10  Reported critical value as well as rest of ABG with Dr. Jimmy Footman via Warren Lacy. The pH was 7.253, discussed pt's vent asynchrony problems and current settings. MD gave orders to switch vent settings from pressure control to [facility] and to keep increasing sedation to stop asynchrony issues. Will report to RRT and continue to monitor.  Henreitta Leber, RN 4:15 AM 04/07/2016

## 2016-04-07 NOTE — Progress Notes (Signed)
Text-paged General Surgery on-call Donne Hazel) about pt's high peak airway pressures d/t possibility of issue with ABRA closure cords. Also talked with RRT, who said it could be caused by pt's asynchrony with vent (has been double-stacking breaths all night despite maxed out versed and fentanyl gtts).   Passed information on to the day shift RN.   Henreitta Leber, RN 7:41 AM 04/07/2016

## 2016-04-08 ENCOUNTER — Inpatient Hospital Stay (HOSPITAL_COMMUNITY): Payer: BLUE CROSS/BLUE SHIELD

## 2016-04-08 DIAGNOSIS — J9602 Acute respiratory failure with hypercapnia: Secondary | ICD-10-CM

## 2016-04-08 LAB — CBC WITH DIFFERENTIAL/PLATELET
BASOS ABS: 0 10*3/uL (ref 0.0–0.1)
BASOS PCT: 0 %
EOS ABS: 0.1 10*3/uL (ref 0.0–0.7)
EOS PCT: 1 %
HEMATOCRIT: 24.9 % — AB (ref 36.0–46.0)
Hemoglobin: 7.7 g/dL — ABNORMAL LOW (ref 12.0–15.0)
LYMPHS ABS: 1.6 10*3/uL (ref 0.7–4.0)
Lymphocytes Relative: 15 %
MCH: 29.2 pg (ref 26.0–34.0)
MCHC: 30.9 g/dL (ref 30.0–36.0)
MCV: 94.3 fL (ref 78.0–100.0)
MONO ABS: 0.8 10*3/uL (ref 0.1–1.0)
Monocytes Relative: 7 %
Neutro Abs: 8.6 10*3/uL — ABNORMAL HIGH (ref 1.7–7.7)
Neutrophils Relative %: 77 %
Platelets: 355 10*3/uL (ref 150–400)
RBC: 2.64 MIL/uL — ABNORMAL LOW (ref 3.87–5.11)
RDW: 16.1 % — AB (ref 11.5–15.5)
WBC: 11 10*3/uL — AB (ref 4.0–10.5)

## 2016-04-08 LAB — GLUCOSE, CAPILLARY
GLUCOSE-CAPILLARY: 80 mg/dL (ref 65–99)
GLUCOSE-CAPILLARY: 92 mg/dL (ref 65–99)
Glucose-Capillary: 98 mg/dL (ref 65–99)
Glucose-Capillary: 102 mg/dL — ABNORMAL HIGH (ref 65–99)
Glucose-Capillary: 112 mg/dL — ABNORMAL HIGH (ref 65–99)

## 2016-04-08 LAB — BLOOD GAS, ARTERIAL
Acid-Base Excess: 0.6 mmol/L (ref 0.0–2.0)
BICARBONATE: 26.2 meq/L — AB (ref 20.0–24.0)
Drawn by: 345601
FIO2: 0.5
LHR: 16 {breaths}/min
O2 Saturation: 99 %
PATIENT TEMPERATURE: 98.6
PEEP: 8 cmH2O
TCO2: 27.8 mmol/L (ref 0–100)
VT: 550 mL
pCO2 arterial: 53.7 mmHg — ABNORMAL HIGH (ref 35.0–45.0)
pH, Arterial: 7.309 — ABNORMAL LOW (ref 7.350–7.450)
pO2, Arterial: 169 mmHg — ABNORMAL HIGH (ref 80.0–100.0)

## 2016-04-08 LAB — POCT I-STAT 3, ART BLOOD GAS (G3+)
Acid-Base Excess: 1 mmol/L (ref 0.0–2.0)
Bicarbonate: 27.7 mEq/L — ABNORMAL HIGH (ref 20.0–24.0)
O2 Saturation: 99 %
PCO2 ART: 54.9 mmHg — AB (ref 35.0–45.0)
PH ART: 7.309 — AB (ref 7.350–7.450)
TCO2: 29 mmol/L (ref 0–100)
pO2, Arterial: 170 mmHg — ABNORMAL HIGH (ref 80.0–100.0)

## 2016-04-08 LAB — RENAL FUNCTION PANEL
ALBUMIN: 1.2 g/dL — AB (ref 3.5–5.0)
ANION GAP: 6 (ref 5–15)
BUN: 24 mg/dL — AB (ref 6–20)
CHLORIDE: 104 mmol/L (ref 101–111)
CO2: 26 mmol/L (ref 22–32)
Calcium: 7.5 mg/dL — ABNORMAL LOW (ref 8.9–10.3)
Creatinine, Ser: 0.86 mg/dL (ref 0.44–1.00)
Glucose, Bld: 112 mg/dL — ABNORMAL HIGH (ref 65–99)
PHOSPHORUS: 2.7 mg/dL (ref 2.5–4.6)
POTASSIUM: 3.6 mmol/L (ref 3.5–5.1)
Sodium: 136 mmol/L (ref 135–145)

## 2016-04-08 LAB — BASIC METABOLIC PANEL
Anion gap: 7 (ref 5–15)
BUN: 23 mg/dL — AB (ref 6–20)
CHLORIDE: 102 mmol/L (ref 101–111)
CO2: 27 mmol/L (ref 22–32)
CREATININE: 0.84 mg/dL (ref 0.44–1.00)
Calcium: 7.7 mg/dL — ABNORMAL LOW (ref 8.9–10.3)
GFR calc Af Amer: >60 mL/min (ref >60)
GLUCOSE: 118 mg/dL — AB (ref 65–99)
POTASSIUM: 3.7 mmol/L (ref 3.5–5.1)
Sodium: 136 mmol/L (ref 135–145)

## 2016-04-08 LAB — MAGNESIUM
MAGNESIUM: 1.6 mg/dL — AB (ref 1.7–2.4)
Magnesium: 1.7 mg/dL (ref 1.7–2.4)

## 2016-04-08 MED ORDER — MIDAZOLAM HCL 5 MG/ML IJ SOLN
0.0000 mg/h | INTRAMUSCULAR | Status: DC
  Administered 2016-04-08 – 2016-04-09 (×4): 10 mg/h via INTRAVENOUS
  Administered 2016-04-10: 5 mg/h via INTRAVENOUS
  Administered 2016-04-10: 10 mg/h via INTRAVENOUS
  Filled 2016-04-08 (×8): qty 20

## 2016-04-08 MED ORDER — TRACE MINERALS CR-CU-MN-SE-ZN 10-1000-500-60 MCG/ML IV SOLN
INTRAVENOUS | Status: AC
  Administered 2016-04-08: 17:00:00 via INTRAVENOUS
  Filled 2016-04-08: qty 1992

## 2016-04-08 MED ORDER — TRACE MINERALS CR-CU-MN-SE-ZN 10-1000-500-60 MCG/ML IV SOLN
INTRAVENOUS | Status: DC
  Filled 2016-04-08: qty 1992

## 2016-04-08 MED ORDER — POTASSIUM CHLORIDE 10 MEQ/50ML IV SOLN
10.0000 meq | INTRAVENOUS | Status: AC
  Administered 2016-04-08 (×4): 10 meq via INTRAVENOUS
  Filled 2016-04-08 (×4): qty 50

## 2016-04-08 MED ORDER — MAGNESIUM SULFATE 2 GM/50ML IV SOLN
2.0000 g | Freq: Once | INTRAVENOUS | Status: AC
  Administered 2016-04-08: 2 g via INTRAVENOUS
  Filled 2016-04-08: qty 50

## 2016-04-08 MED ORDER — FUROSEMIDE 10 MG/ML IJ SOLN
40.0000 mg | Freq: Four times a day (QID) | INTRAMUSCULAR | Status: AC
  Administered 2016-04-08 (×2): 40 mg via INTRAVENOUS
  Filled 2016-04-08 (×2): qty 4

## 2016-04-08 NOTE — Progress Notes (Signed)
PULMONARY / CRITICAL CARE MEDICINE   Name: Christina Castaneda MRN: [id number] DOB: 01-Oct-1956    ADMISSION DATE:  03/28/2016 CONSULTATION DATE:  03/29/2016  REFERRING MD:  CCS  CHIEF COMPLAINT:  Colostomy bag leaking  SUBJECTIVE:  Had 8 liters urine outpt with lasix yesterday.  VITAL SIGNS: BP 102/48 mmHg  Pulse 51  Temp(Src) 98.1 F (36.7 C) (Oral)  Resp 15  Ht 5\' 6"  (1.676 m)  Wt 336 lb (152.409 kg)  BMI 54.26 kg/m2  SpO2 100%  LMP 11/18/2012  HEMODYNAMICS: CVP:  [5 mmHg-8 mmHg] 8 mmHg  VENTILATOR SETTINGS: Vent Mode:  [-] PRVC FiO2 (%):  [40 %-100 %] 50 % Set Rate:  [16 bmp] 16 bmp Vt Set:  [550 mL] 550 mL PEEP:  [8 cmH20] 8 cmH20 Plateau Pressure:  [20 [id number] cmH20] 20 cmH20  INTAKE / OUTPUT: I/O last 3 completed shifts: In: 6622.4 [I.V.:2332.4; NG/GT:420; IV Piggyback:50] Out: [id number] [Urine:9585; Drains:725]  PHYSICAL EXAMINATION: General: sedated Neuro: RASS -2 HEENT: trach site clean Cardiac: regular Chest: better air movement, still faint b/l wheeze Abd: wound vac Ext: 2+ edema Skin: no rashes  LABS:  BMET  Recent Labs Lab 04/06/16 1724 04/07/16 0210 04/08/16 0450  NA 135 135 136  K 5.1 4.9 3.6  CL 107 107 104  CO2 24 23 26   BUN 23* 24* 24*  CREATININE 0.82 0.80 0.86  GLUCOSE 110* 123* 112*   Electrolytes  Recent Labs Lab 04/06/16 0444 04/06/16 0530 04/06/16 1724 04/07/16 0210 04/08/16 0450  CALCIUM  --  7.2* 7.6* 7.6* 7.5*  MG 1.9  --   --  2.2 1.6*  PHOS  --  3.6  --  3.2 2.7   CBC  Recent Labs Lab 04/06/16 0444 04/07/16 0210 04/08/16 0450  WBC 11.4* 12.4* 11.0*  HGB 7.2* 8.4* 7.7*  HCT 25.3* 26.5* 24.9*  PLT 323 376 355   Coag's No results for input(s): APTT, INR in the last 168 hours.  Sepsis Markers  Recent Labs Lab 04/05/16 1022 04/06/16 0444 04/07/16 0210  PROCALCITON 0.31 0.26 0.20   ABG  Recent Labs Lab 04/07/16 0540 04/08/16 0423 04/08/16 0453  PHART 7.271* 7.309* 7.309*  PCO2ART 52.9* 53.7*  54.9*  PO2ART 118.0* 169* 170.0*   Liver Enzymes  Recent Labs Lab 04/02/16 0349  04/05/16 0547 04/06/16 0530 04/07/16 0210 04/08/16 0450  AST 17  --  32  --   --   --   ALT 11*  --  15  --   --   --   ALKPHOS 34*  --  46  --   --   --   BILITOT 0.4  --  0.3  --   --   --   ALBUMIN 1.2*  < > 1.1* 1.1* 1.2* 1.2*  < > = values in this interval not displayed.  Cardiac Enzymes No results for input(s): TROPONINI, PROBNP in the last 168 hours.  Glucose  Recent Labs Lab 04/06/16 1721 04/06/16 2339 04/07/16 1245 04/07/16 1731 04/07/16 2357 04/08/16 0624  GLUCAP 85 81 111* 104* 80 92   Imaging No results found.  STUDIES:    MICROBIOLOGY: Blood Ctx x2 4/9>>> Coag neg staph Urine Ctx 4/9>>> Coag neg staph Tracheal Asp Ctx 4/9>>> yeast Blood Ctx x2 4/5>>> negative Urine Ctx 4/5:  Multiple species present MRSA PCR 4/6:  Negative   ANTIBIOTICS: Cipro 4/5>>> Flagyl 4/5>>> Vanco 4/11 >>  SIGNIFICANT EVENTS: 3/20 Exp lap appendectomy,removal sigmoid colon, bowel repair in setting diverticulitis. [facility] 4/6  Exp Lap , colostomy removal, partial colectomy,omentectom, wound debridement, open wound with wound vac. 4/10 Start TPN  LINES/TUBES: ETT 4/6>>> 4/13 R PICC 4/6>>> L RADIAL ART LINE 4/7>>>4/15 #6 cuffed Trach Hyman Bible) 4/13 >> Rt femoral aline 4/15 >>  DISCUSSION: 60 yo female had diverticulitis with sigmoid colon resection, appendectomy at [facility] on 3/20 and required colostomy.  Noted to have feculent material from midline wound and present to ER.  ASSESSMENT / PLAN:  PULMONARY A: Compromised airway. 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 Hx of COPD with asthma, OSA >> wheezing noted 4/14. Tobacco abuse. P:   Continue full vent support >> no weaning until okay with surgery F/u CXR, ABG Brovana, pulmicort with prn albuterol  CARDIOVASCULAR A:  Septic shock. Hx of HTN. Hypervolemia >> great response to lasix 4/15. P:  Lasix 40 mg x 2 on 4/16, and pressors to keep  MAP > 65 ?if she has RV dilation causing LV compression >> /u Echo  RENAL A:   AKI >> resolved. Hypokalemia. Hypomagnesemia. P:   Monitor renal fx, urine outpt Replace electrolytes as needed  GASTROINTESTINAL A:   Peritonitis s/p laparotomy. P:   Post-op care, TPN per CCS Trickle tube feeds as tolerated per CCS >> held 4/16 due to increase residual Protonix for SUP  HEMATOLOGIC A:   Anemia of critical illness. P:  F/u CBC Transfuse for Hb < 7 Lovenox for DVT prevention  INFECTIOUS A:   Septic shock 2nd to peritonitis and cellulitis of ostomy site. Coag neg Staph bacteremia, UTI. Hx of PCN allergy. P:   Day 12 cipro, flagyl for peritonitis Day 7 vancomycin for Coag neg staph  ENDOCRINE A:   Hx of hypothyroidism. P:   Synthroid IV  NEUROLOGIC A:   Acute metabolic encephalopathy. Bradycardia from diprivan. P:   RASS goal: -2   CC time 32 minutes.  Chesley Mires, MD [facility] Pulmonary/Critical Care 04/08/2016, 7:58 AM Pager:  [phone] After 3pm call: [phone]

## 2016-04-08 NOTE — Progress Notes (Signed)
Pt vomited small amount bilious liquid from mouth and nose.  Vital HP infusing at 20cc/hr paused, checked residual (20cc).  Dr. Elsworth Soho notified, ordered to call CCS.  Dr Donne Hazel notified, orders received to stop tube feeding and all per tube meds and reassess in am.  Will continue to monitor.  Vista Lawman, RN

## 2016-04-08 NOTE — Progress Notes (Signed)
[facility] ADULT ICU REPLACEMENT PROTOCOL FOR AM LAB REPLACEMENT ONLY  The patient does apply for the [facility] Adult ICU Electrolyte Replacment Protocol based on the criteria listed below:   1. Is GFR >/= 40 ml/min? Yes.    Patient's GFR today is >60 2. Is urine output >/= 0.5 ml/kg/hr for the last 6 hours? Yes.   Patient's UOP is 1.03 ml/kg/hr 3. Is BUN < 60 mg/dL? Yes.    Patient's BUN today is 24 4. Abnormal electrolyte(s):  Mg - 1.6 5. Ordered repletion with: PER PROTOCOL 6. If a panic level lab has been reported, has the CCM MD in charge been notified? Yes.  .   Physician:  Dr. Molli Posey 04/08/2016 5:59 AM

## 2016-04-08 NOTE — Progress Notes (Signed)
PARENTERAL NUTRITION CONSULT NOTE - Follow Up  Pharmacy Consult for TPN Indication: Intolerance to enteral feeds (Expected prolonged ileus)  Allergies  Allergen Reactions  . Penicillins Anaphylaxis, Hives, Shortness Of Breath, Swelling and Other (See Comments)    Angioedema   Patient Measurements: Height: _0  (167.6 cm) Weight: (!) 336 lb (152.409 kg) IBW/kg (Calculated) : 59.3  IBW: ~59.3kg Adjusted Body Weight: 82.4kg  Vital Signs: Temp: 98.1 F (36.7 C) (04/16 0410) Temp Source: Oral (04/16 0410) BP: 102/48 mmHg (04/16 0700) Pulse Rate: 51 (04/16 0700) Intake/Output from previous day: 04/15 0701 - 04/16 0700 In: 4402 [I.V.:1552; NG/GT:300; IV Piggyback:50; TPN:2500] Out: 8800 [Urine:8300; Drains:500] Intake/Output from this shift:   Labs:  Recent Labs  04/06/16 0444 04/07/16 0210 04/08/16 0450  WBC 11.4* 12.4* 11.0*  HGB 7.2* 8.4* 7.7*  HCT 25.3* 26.5* 24.9*  PLT 323 376 355    Recent Labs  04/06/16 0444  04/06/16 0530 04/06/16 1724 04/07/16 0210 04/08/16 0450  NA  --   < > 134* 135 135 136  K  --   < > 5.0 5.1 4.9 3.6  CL  --   < > 106 107 107 104  CO2  --   < > _1 GLUCOSE  --   < > 120* 110* 123* 112*  BUN  --   < > 23* 23* 24* 24*  CREATININE  --   < > 0.91 0.82 0.80 0.86  CALCIUM  --   < > 7.2* 7.6* 7.6* 7.5*  MG 1.9  --   --   --  2.2 1.6*  PHOS  --   --  3.6  --  3.2 2.7  ALBUMIN  --   --  1.1*  --  1.2* 1.2*  TRIG  --   --   --   --  53  --   < > = values in this interval not displayed. Estimated Creatinine Clearance: 103.3 mL/min (by C-G formula based on Cr of 0.86).   Recent Labs  04/07/16 1731 04/07/16 2357 04/08/16 0624  GLUCAP 104* 80 92   Medical History: Past Medical History  Diagnosis Date  . Goiter   . Hypothyroidism   . Asthma     mild  . Hypertension   . Atypical endometrial hyperplasia   . Heart murmur   . Vitamin B12 deficiency   . Yeast infection     for last 3 weeks  . Gout   . COPD (chronic  obstructive pulmonary disease) (HCC)    Insulin Requirements in the past 24 hours:  0 units of SSI  Admission: 36 yof 2 weeks out from emergency colectomy/colostomy. Discharged 4/2, feculent discharge noted from midline wound when having staples removed. Noted to have necrotic stoma w/ peritonitis, fascial necrosis. Pharmacy consulted to start TPN due to expected prolonged ileus.   Assessment: 4-7 Patient in the surgical ICU post-op on 4/6. No muscle or subcutaneous fat depletion noticed per RD assessment. 4-9 Triglycerides 48 on propofol this AM 4-13 TG 49 on 4/12; propofol resumed post-operatively 4/12 and running at 40 mcg/kg/min.   Surgeries/Procedures: ~2 wks pta: emergency colectomy/colostomy 4/6: colostomy removal and end stapling of stoma, partial colectomy, ex-lap, omentectomy, wound debridement 4/7: lap, VAC change, possible debridement 4/10: Re-exploration of abdomen, Wound VAC change 4/12: Re-exploration of abdomen, placement of abdominal wall closure device 4/13: Trach placed  4/14: Wound VAC change  GI: s/p Hartmann's procedure ~2wks ago. Pre-alb down to 3.2. Albumin is low  at 1.2. NG output 0 yesterday,Drains put out 546m yesterday which is up slightly. Trickle feeds to continue until off pressors. TFs were advanced per surgery yesterday while on pressors uo to 257mhr. Early this am patient had bilious liquid coming out from mouth and nose so TFs and per tube meds are currently on hold. Endo: No DM hx. CBGs controlled (80s-110s) on SSI. hypothyroidism - synthroid Lytes: wnl. K down to 3.6 (goal >/=4 w/ ileus), coCa 9.8, Mag down to 1.6 'already replaced' (goal >/=2 w/ ileus) phos ok at 2.7 Renal: SCr stable, normalized CrCl ~8040min. UOP good at 2.3ml25m/hr. NS [facility]. Vent post-op. Trach placed 4/13- FiO2 40% on vent. Thick, moderate secretions. Cards: BP ok and HR brady on Norepi back up to 12mc75mn.  Hepatobil: LFTs/alk phos/tbili wnl, TG remain stable  at 53 Neuro: Fentanyl gtt @ 400, Versed @ 10mg/39moff Propofol due to bradycardia. RASS -2 (at goal), GCS 12. CPOT 1 currently.  ID: S/p 10 days of Cipro/Flagyl for intra-abdominal infxn. Afebrile, WBC trending down. Coagulase negative staph in blood and urine.  Heme: Hgb low at 8.4, plts wnl Best Practices: scds, lovenox 40mg p50mppi iv  TPN Access: PICC 03/29/16>> TPN start date: 03/29/16  Current Nutrition:  Vital HP Prostat 60mL BI106m0g and 200 kcal per serving) TFs and per tube meds currently on hold Clinimix 5/15 at 100 ml/hr  TPN provides 120 g of protein and 1704 kcal per day  Nutritional Goals: (per RD recs 4/13) Kcal: 1300-1477 kCal,  Protein: 148 g   Plan:  Decrease Clinimix E 5/15  (add back electrolytes) to 83ml/hr 25minue to hold lipids due to patient having received > 100g of lipids in the past week while on propofol  *Will consider MWF lipids to better meet current nutritional goals TPN provides 83 g of protein and 1414 kcal per day meeting 56% of protein needs and 100% of kcal needs *Will not be able to meet patient needs with Clinimix Currently holding tube feeds *If patient able to restart Prostat 60mL BID 95m it will help meet patient's daily protein goal Continue MVI and TE in TPN Continue moderate SSI and adjust as needed Monitor TPN labs, renal function panel daily, K closely Follow-up restart of TFs  Give KCl 10mEq x 4 38m today  Briget Shaheed BatcElenor QuinonesCPS ClinicProvidence Seward Medical Centerharmacist Pager [phone] [id number] AM

## 2016-04-08 NOTE — Progress Notes (Signed)
4 Days Post-Op  Subjective: Pt with no acute issues overnight  Objective: Vital signs in last 24 hours: Temp:  [97 F (36.1 C)-98.1 F (36.7 C)] 97.7 F (36.5 C) (04/16 0822) Pulse Rate:  [45-63] 50 (04/16 0822) Resp:  [10-20] 17 (04/16 0822) BP: (90-168)/(36-93) 106/46 mmHg (04/16 0822) SpO2:  [100 %] 100 % (04/16 0822) Arterial Line BP: (77-148)/(34-64) 103/43 mmHg (04/16 0800) FiO2 (%):  [40 %-100 %] 40 % (04/16 0822) Weight:  [152.409 kg (336 lb)] 152.409 kg (336 lb) (04/16 0454) Last BM Date:  (UTA; pt intubated; fresh colostomy w/minimal serosanguinous )  Intake/Output from previous day: 04/15 0701 - 04/16 0700 In: 4402 [I.V.:1552; NG/GT:300; IV Piggyback:50; TPN:2500] Out: 9000 [Urine:8500; Drains:500] Intake/Output this shift: Total I/O In: 168.4 [I.V.:68.4; TPN:100] Out: -   General appearance: arousable Resp: clear to auscultation bilaterally Cardio: regular rate and rhythm, S1, S2 normal, no murmur, click, rub or gallop GI: soft, ABRA in place, ostomy pink  Lab Results:   Recent Labs  04/07/16 0210 04/08/16 0450  WBC 12.4* 11.0*  HGB 8.4* 7.7*  HCT 26.5* 24.9*  PLT 376 355   BMET  Recent Labs  04/07/16 0210 04/08/16 0450  NA 135 136  K 4.9 3.6  CL 107 104  CO2 23 26  GLUCOSE 123* 112*  BUN 24* 24*  CREATININE 0.80 0.86  CALCIUM 7.6* 7.5*   PT/INR No results for input(s): LABPROT, INR in the last 72 hours. ABG  Recent Labs  04/08/16 0423 04/08/16 0453  PHART 7.309* 7.309*  HCO3 26.2* 27.7*    Studies/Results: Dg Chest Port 1 View  04/08/2016  CLINICAL DATA:  60 year old female with history of respiratory failure. EXAM: PORTABLE CHEST 1 VIEW COMPARISON:  Chest x-ray 04/07/2016. FINDINGS: A tracheostomy tube is in place with tip 5.7 cm above the carina. There is a right upper extremity PICC with tip terminating in the mid superior vena cava. Persistent opacity in the medial aspect of the right lower lobe likely corresponds to an area of  scarring based on comparison with prior CT of the abdomen and pelvis 03/28/2016. No definite acute consolidative airspace disease. No pleural effusions. No evidence of pulmonary edema. Heart size is mildly enlarged. Upper mediastinal contours are within normal limits. IMPRESSION: 1. Support apparatus, as above. 2. Probable scarring in the right lower lobe redemonstrated, as above. No radiographic evidence of acute cardiopulmonary disease. Electronically Signed   By: Vinnie Langton M.D.   On: 04/08/2016 08:22   Dg Chest Port 1 View  04/07/2016  CLINICAL DATA:  60 year old female with a history of hypertension EXAM: PORTABLE CHEST 1 VIEW COMPARISON:  04/06/2016 FINDINGS: Cardiomediastinal silhouette unchanged in size and contour. Vague airspace and interstitial opacities at the bilateral lung bases. Unchanged tracheostomy tube. Unchanged gastric tube, terminating out of the field of view. Unchanged right sided upper extremity PICC. Atherosclerosis. IMPRESSION: Low lung volumes with bibasilar vague opacities, potentially atelectasis or developing consolidation. No pleural effusion. Unchanged right upper extremity PICC, gastric tube, and tracheostomy tube. Atherosclerosis. Signed, Dulcy Fanny. Earleen Newport, DO [facility] Electronically Signed   By: Corrie Mckusick D.O.   On: 04/07/2016 09:32    Anti-infectives: Anti-infectives    Start     Dose/Rate Route Frequency Ordered Stop   04/03/16 2100  vancomycin (VANCOCIN) 1,500 mg in sodium chloride 0.9 % 500 mL IVPB  Status:  Discontinued     1,500 mg 250 mL/hr over 120 Minutes Intravenous Every 12 hours 04/02/16 2020  04/06/16 0943   04/02/16 2030  vancomycin (VANCOCIN) 2,500 mg in sodium chloride 0.9 % 500 mL IVPB     2,500 mg 250 mL/hr over 120 Minutes Intravenous  Once 04/02/16 2020 04/02/16 2306   03/29/16 1200  ciprofloxacin (CIPRO) IVPB 400 mg  Status:  Discontinued     400 mg 200 mL/hr over 60 Minutes  Intravenous Every 12 hours 03/29/16 0213 04/06/16 0943   03/29/16 0600  [MAR Hold]  ciprofloxacin (CIPRO) IVPB 400 mg     (MAR Hold since 03/28/16 2357)   400 mg 200 mL/hr over 60 Minutes Intravenous On call to O.R. 03/28/16 2306 03/28/16 2355   03/29/16 0600  [MAR Hold]  metroNIDAZOLE (FLAGYL) IVPB 500 mg     (MAR Hold since 03/28/16 2357)   500 mg 100 mL/hr over 60 Minutes Intravenous On call to O.R. 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 Hulen Skains, subsequent ruq stoma and washout, subsequent abra placement by Dr Redmond Pulling - change vac on Monday - hold TFs for now, Con't TNA Hypotension-on levo, will try to wean as tol Respiratory-full vent support, appreciate CCM assistance, better again after going back to pc VTE prophylaxis-SCD/lovenox Neuro-propofol and fentanyl for sedation FEN-tna Dispo-ICU   LOS: 11 days    Rosario Jacks., Anne Hahn 04/08/2016

## 2016-04-09 ENCOUNTER — Inpatient Hospital Stay (HOSPITAL_COMMUNITY): Payer: BLUE CROSS/BLUE SHIELD

## 2016-04-09 DIAGNOSIS — R0600 Dyspnea, unspecified: Secondary | ICD-10-CM

## 2016-04-09 DIAGNOSIS — Z930 Tracheostomy status: Secondary | ICD-10-CM

## 2016-04-09 LAB — ECHOCARDIOGRAM COMPLETE
HEIGHTINCHES: 66 in
WEIGHTICAEL: 5360 [oz_av]

## 2016-04-09 LAB — CBC WITH DIFFERENTIAL/PLATELET
BASOS ABS: 0 10*3/uL (ref 0.0–0.1)
BASOS PCT: 0 %
EOS ABS: 0.1 10*3/uL (ref 0.0–0.7)
Eosinophils Relative: 1 %
HEMATOCRIT: 27.6 % — AB (ref 36.0–46.0)
HEMOGLOBIN: 8.3 g/dL — AB (ref 12.0–15.0)
Lymphocytes Relative: 15 %
Lymphs Abs: 1.6 10*3/uL (ref 0.7–4.0)
MCH: 28.2 pg (ref 26.0–34.0)
MCHC: 30.1 g/dL (ref 30.0–36.0)
MCV: 93.9 fL (ref 78.0–100.0)
Monocytes Absolute: 0.5 10*3/uL (ref 0.1–1.0)
Monocytes Relative: 5 %
NEUTROS ABS: 8.8 10*3/uL — AB (ref 1.7–7.7)
NEUTROS PCT: 79 %
Platelets: 417 10*3/uL — ABNORMAL HIGH (ref 150–400)
RBC: 2.94 MIL/uL — AB (ref 3.87–5.11)
RDW: 15.9 % — ABNORMAL HIGH (ref 11.5–15.5)
WBC: 11.1 10*3/uL — AB (ref 4.0–10.5)

## 2016-04-09 LAB — COMPREHENSIVE METABOLIC PANEL
ALBUMIN: 1.3 g/dL — AB (ref 3.5–5.0)
ALT: 11 U/L — ABNORMAL LOW (ref 14–54)
AST: 18 U/L (ref 15–41)
Alkaline Phosphatase: 60 U/L (ref 38–126)
Anion gap: 7 (ref 5–15)
BUN: 21 mg/dL — AB (ref 6–20)
CHLORIDE: 101 mmol/L (ref 101–111)
CO2: 29 mmol/L (ref 22–32)
Calcium: 7.9 mg/dL — ABNORMAL LOW (ref 8.9–10.3)
Creatinine, Ser: 0.91 mg/dL (ref 0.44–1.00)
GFR calc Af Amer: >60 mL/min (ref >60)
GFR calc non Af Amer: >60 mL/min (ref >60)
GLUCOSE: 127 mg/dL — AB (ref 65–99)
POTASSIUM: 3.7 mmol/L (ref 3.5–5.1)
Sodium: 137 mmol/L (ref 135–145)
Total Bilirubin: 0.2 mg/dL — ABNORMAL LOW (ref 0.3–1.2)
Total Protein: 5.7 g/dL — ABNORMAL LOW (ref 6.5–8.1)

## 2016-04-09 LAB — POCT I-STAT 3, ART BLOOD GAS (G3+)
ACID-BASE EXCESS: 6 mmol/L — AB (ref 0.0–2.0)
BICARBONATE: 31.9 meq/L — AB (ref 20.0–24.0)
O2 Saturation: 97 %
PH ART: 7.385 (ref 7.350–7.450)
TCO2: 34 mmol/L (ref 0–100)
pCO2 arterial: 53.4 mmHg — ABNORMAL HIGH (ref 35.0–45.0)
pO2, Arterial: 93 mmHg (ref 80.0–100.0)

## 2016-04-09 LAB — GLUCOSE, CAPILLARY
GLUCOSE-CAPILLARY: 91 mg/dL (ref 65–99)
GLUCOSE-CAPILLARY: 93 mg/dL (ref 65–99)
Glucose-Capillary: 73 mg/dL (ref 65–99)

## 2016-04-09 LAB — PREALBUMIN: Prealbumin: 13 mg/dL — ABNORMAL LOW (ref 18–38)

## 2016-04-09 LAB — MAGNESIUM: Magnesium: 1.7 mg/dL (ref 1.7–2.4)

## 2016-04-09 LAB — PHOSPHORUS: Phosphorus: 2.8 mg/dL (ref 2.5–4.6)

## 2016-04-09 MED ORDER — POTASSIUM CHLORIDE 10 MEQ/50ML IV SOLN
10.0000 meq | INTRAVENOUS | Status: AC
  Administered 2016-04-09 (×2): 10 meq via INTRAVENOUS
  Filled 2016-04-09: qty 50

## 2016-04-09 MED ORDER — INSULIN ASPART 100 UNIT/ML ~~LOC~~ SOLN: 0.0000 [IU] | Freq: Two times a day (BID) | SUBCUTANEOUS | Status: DC

## 2016-04-09 MED ORDER — MAGNESIUM SULFATE 50 % IJ SOLN
3.0000 g | Freq: Once | INTRAVENOUS | Status: AC
  Administered 2016-04-09: 3 g via INTRAVENOUS
  Filled 2016-04-09: qty 6

## 2016-04-09 MED ORDER — FAT EMULSION 20 % IV EMUL
240.0000 mL | INTRAVENOUS | Status: AC
  Administered 2016-04-09: 240 mL via INTRAVENOUS
  Filled 2016-04-09: qty 250

## 2016-04-09 MED ORDER — FUROSEMIDE 10 MG/ML IJ SOLN
20.0000 mg | Freq: Every day | INTRAMUSCULAR | Status: DC
  Administered 2016-04-09 – 2016-04-10 (×2): 20 mg via INTRAVENOUS
  Filled 2016-04-09 (×2): qty 2

## 2016-04-09 MED ORDER — M.V.I. ADULT IV INJ
INJECTION | INTRAVENOUS | Status: AC
  Administered 2016-04-09: 18:00:00 via INTRAVENOUS
  Filled 2016-04-09: qty 1992

## 2016-04-09 NOTE — Progress Notes (Signed)
PULMONARY / CRITICAL CARE MEDICINE   Name: Christina Castaneda MRN: [id number] DOB: 11-Dec-1956    ADMISSION DATE:  03/28/2016 CONSULTATION DATE:  03/29/2016  REFERRING MD:  CCS  CHIEF COMPLAINT:  Colostomy bag leaking   60 yo female had diverticulitis with sigmoid colon resection, appendectomy at [facility] on 3/20 and required colostomy.  Noted to have feculent material from midline wound and present to ER on 4/5  S/p laparotomy, colostomy removal and end stapling of stoma, partial colectomy, omentectomy, debridement of wound--Dr. Hulen Skains, subsequent ruq stoma and washout, subsequent abra placement by Dr Redmond Pulling  SUBJECTIVE:  Remains critically ill on levo gtt Sedated 9L out with lasix  VITAL SIGNS: BP 94/40 mmHg  Pulse 25  Temp(Src) 98.7 F (37.1 C) (Axillary)  Resp 16  Ht 5\' 6"  (1.676 m)  Wt 335 lb (151.955 kg)  BMI 54.10 kg/m2  SpO2 100%  LMP 11/18/2012  HEMODYNAMICS: CVP:  [10 mmHg-13 mmHg] 10 mmHg  VENTILATOR SETTINGS: Vent Mode:  [-] PRVC FiO2 (%):  [40 %] 40 % Set Rate:  [16 bmp] 16 bmp Vt Set:  [550 mL] 550 mL PEEP:  [5 cmH20] 5 cmH20 Plateau Pressure:  [18 cmH20-23 cmH20] 18 cmH20  INTAKE / OUTPUT: I/O last 3 completed shifts: In: 6298.4 [I.V.:2616.4; NG/GT:70; IV Piggyback:250] Out: [id number]; Drains:550]  PHYSICAL EXAMINATION: General: sedated, acutely ill Neuro: RASS -2 HEENT: trach site clean Cardiac: regular Chest: decreased BS BL, still faint b/l wheeze Abd: wound vac Ext: 2+ edema Skin: no rashes  LABS:  BMET  Recent Labs Lab 04/08/16 0450 04/08/16 1744 04/09/16 0225  NA 136 136 137  K 3.6 3.7 3.7  CL 104 102 101  CO2 26 27 29   BUN 24* 23* 21*  CREATININE 0.86 0.84 0.91  GLUCOSE 112* 118* 127*   Electrolytes  Recent Labs Lab 04/07/16 0210 04/08/16 0450 04/08/16 1744 04/09/16 0225  CALCIUM 7.6* 7.5* 7.7* 7.9*  MG 2.2 1.6* 1.7 1.7  PHOS 3.2 2.7  --  2.8   CBC  Recent Labs Lab 04/07/16 0210 04/08/16 0450 04/09/16 0225   WBC 12.4* 11.0* 11.1*  HGB 8.4* 7.7* 8.3*  HCT 26.5* 24.9* 27.6*  PLT 376 355 417*   Coag's No results for input(s): APTT, INR in the last 168 hours.  Sepsis Markers  Recent Labs Lab 04/05/16 1022 04/06/16 0444 04/07/16 0210  PROCALCITON 0.31 0.26 0.20   ABG  Recent Labs Lab 04/08/16 0423 04/08/16 0453 04/09/16 0433  PHART 7.309* 7.309* 7.385  PCO2ART 53.7* 54.9* 53.4*  PO2ART 169* 170.0* 93.0   Liver Enzymes  Recent Labs Lab 04/05/16 0547  04/07/16 0210 04/08/16 0450 04/09/16 0225  AST 32  --   --   --  18  ALT 15  --   --   --  11*  ALKPHOS 46  --   --   --  60  BILITOT 0.3  --   --   --  0.2*  ALBUMIN 1.1*  < > 1.2* 1.2* 1.3*  < > = values in this interval not displayed.  Cardiac Enzymes No results for input(s): TROPONINI, PROBNP in the last 168 hours.  Glucose  Recent Labs Lab 04/08/16 0624 04/08/16 1219 04/08/16 1740 04/08/16 2004 04/09/16 04/09/16 0821  GLUCAP 92 98 102* 112* 91 93   Imaging Dg Chest Port 1 View  04/09/2016  CLINICAL DATA:  60 year old female with respiratory failure. Subsequent encounter. EXAM: PORTABLE CHEST 1 VIEW COMPARISON:  04/08/2016. FINDINGS: Tracheostomy tube tip midline. Right central  line tip distal superior vena cava level. Feeding tube in place with tip not imaged on present exam. Mild cardiomegaly. Mild pulmonary vascular prominence most notable centrally. Right infrahilar consolidation may represent atelectasis rather than infiltrate. Attention to this on follow-up. IMPRESSION: Mild pulmonary vascular prominence most notable centrally. Right infrahilar consolidation may represent atelectasis rather than infiltrate. Attention to this on follow-up. Electronically Signed   By: Genia Del M.D.   On: 04/09/2016 08:04    STUDIES:    MICROBIOLOGY: Blood Ctx x2 4/9>>> Coag neg staph Urine Ctx 4/9>>> Coag neg staph Tracheal Asp Ctx 4/9>>> yeast Blood Ctx x2 4/5>>> negative Urine Ctx 4/5:  Multiple species  present MRSA PCR 4/6:  Negative   ANTIBIOTICS: Cipro 4/5>>> Flagyl 4/5>>> Vanco 4/11 >>  SIGNIFICANT EVENTS: 3/20 Exp lap appendectomy,removal sigmoid colon, bowel repair in setting diverticul[facility]. [facility]6 Exp Lap , colostomy removal, partial colectomy,omentectom, wound debridement, open wound with wound vac. 4/10 Start TPN  LINES/TUBES: ETT 4/6>>> 4/13 R PICC 4/6>>> L RADIAL ART LINE 4/7>>>4/15 #6 cuffed Trach [employer]) 4/13 >> Rt femoral aline 4/15 >>  DISCUSSION: Maintain deep sedation while open abdomen  ASSESSMENT / PLAN:  PULMONARY A: Acute resp failure , prolonged ventialtion  s/p tracheostomy. Hx of COPD with asthma, OSA >> wheezing noted 4/14. Tobacco abuse. P:   Continue full vent support >> no weaning until okay with surgery Brovana, pulmicort with prn albuterol  CARDIOVASCULAR A:  Septic shock. Hx of HTN. Hypervolemia >> great response to lasix 4/15. Echo - nml LV fn, nml RV P:  Levophed gtt  to keep MAP > 65   RENAL A:   AKI >> resolved. Hypokalemia. Hypomagnesemia. P:   Monitor renal fx, urine outpt Replace electrolytes as needed Drop lasix to 20 daily for equal balance  GASTROINTESTINAL A:   Peritonitis s/p laparotomy. P:   Post-op care, TPN per CCS Trickle tube feeds as tolerated per CCS >> held 4/16 due to increase residual Protonix for SUP  HEMATOLOGIC A:   Anemia of critical illness. P:  F/u CBC Transfuse for Hb < 7 Lovenox for DVT prevention  INFECTIOUS A:   Septic shock 2nd to peritonitis and cellulitis of ostomy site. Coag neg Staph bacteremia, UTI. Hx of PCN allergy. P:   cipro, flagyl for peritonitis- until open abd dc vancomycin for Coag neg staph x 7ds  ENDOCRINE A:   Hx of hypothyroidism. P:   Synthroid IV  NEUROLOGIC A:   Acute metabolic encephalopathy. Bradycardia from diprivan. P:   RASS goal: -2 Versed/fent gtt  The patient is critically ill with multiple organ systems failure and requires high  complexity decision making for assessment and support, frequent evaluation and titration of therapies, application of advanced monitoring technologies and extensive interpretation of multiple databases. Critical Care Time devoted to patient care services described in this note independent of APP time is 35 minutes.   Kara Mead MD. Shade Flood. [facility] Pager [phone] If no response call [phone]   04/09/2016     04/09/2016, 12:31 PM

## 2016-04-09 NOTE — Progress Notes (Signed)
Progress Note: General Surgery Service   Subjective: Hypotensive this morning, better with levophed  Objective: Vital signs in last 24 hours: Temp:  [98.3 F (36.8 C)-98.7 F (37.1 C)] 98.7 F (37.1 C) (04/17 1217) Pulse Rate:  [25-66] 25 (04/17 1000) Resp:  [14-22] 16 (04/17 1100) BP: (66-153)/(18-96) 94/40 mmHg (04/17 1100) SpO2:  [82 %-100 %] 100 % (04/17 1138) Arterial Line BP: (64-211)/(33-81) 115/50 mmHg (04/17 1100) FiO2 (%):  [40 %] 40 % (04/17 1138) Weight:  [151.955 kg (335 lb)] 151.955 kg (335 lb) (04/17 0500) Last BM Date:  (UTA; pt intubated; fresh colostomy w/minimal serosanguinous )  Intake/Output from previous day: 04/16 0701 - 04/17 0700 In: 4066.4 [I.V.:1674.4; NG/GT:30; IV Piggyback:200; TPN:2162] Out: 9965 [XMIWO:0321; Drains:300] Intake/Output this shift: Total I/O In: 819.9 [I.V.:287.9; IV Piggyback:200; TPN:332] Out: 275 [Urine:275]  Lungs: coarse b/l  Cardiovascular: RRR  Abd: open abdomen with abra and vac in place  Extremities: +edema  Neuro: GCS 5T  Lab Results: CBC   Recent Labs  04/08/16 0450 04/09/16 0225  WBC 11.0* 11.1*  HGB 7.7* 8.3*  HCT 24.9* 27.6*  PLT 355 417*   BMET  Recent Labs  04/08/16 1744 04/09/16 0225  NA 136 137  K 3.7 3.7  CL 102 101  CO2 27 29  GLUCOSE 118* 127*  BUN 23* 21*  CREATININE 0.84 0.91  CALCIUM 7.7* 7.9*   PT/INR No results for input(s): LABPROT, INR in the last 72 hours. ABG  Recent Labs  04/08/16 0453 04/09/16 0433  PHART 7.309* 7.385  HCO3 27.7* 31.9*    Studies/Results:  Anti-infectives: Anti-infectives    Start     Dose/Rate Route Frequency Ordered Stop   04/03/16 2100  vancomycin (VANCOCIN) 1,500 mg in sodium chloride 0.9 % 500 mL IVPB  Status:  Discontinued     1,500 mg 250 mL/hr over 120 Minutes Intravenous Every 12 hours 04/02/16 2020 04/06/16 0943   04/02/16 2030  vancomycin (VANCOCIN) 2,500 mg in sodium chloride 0.9 % 500 mL IVPB     2,500 mg 250 mL/hr over  120 Minutes Intravenous  Once 04/02/16 2020 04/02/16 2306   03/29/16 1200  ciprofloxacin (CIPRO) IVPB 400 mg  Status:  Discontinued     400 mg 200 mL/hr over 60 Minutes Intravenous Every 12 hours 03/29/16 0213 04/06/16 0943   03/29/16 0600  [MAR Hold]  ciprofloxacin (CIPRO) IVPB 400 mg     (MAR Hold since 03/28/16 2357)   400 mg 200 mL/hr over 60 Minutes Intravenous On call to O.R. 03/28/16 2306 03/28/16 2355   03/29/16 0600  [MAR Hold]  metroNIDAZOLE (FLAGYL) IVPB 500 mg     (MAR Hold since 03/28/16 2357)   500 mg 100 mL/hr over 60 Minutes Intravenous On call to O.R. 03/28/16 2306 03/29/16 0025   03/29/16 0600  metroNIDAZOLE (FLAGYL) IVPB 500 mg  Status:  Discontinued     500 mg 100 mL/hr over 60 Minutes Intravenous Every 8 hours 03/29/16 0213 04/06/16 0943   03/28/16 2300  ertapenem (INVANZ) 1 g in sodium chloride 0.9 % 50 mL IVPB  Status:  Discontinued     1 g 100 mL/hr over 30 Minutes Intravenous  Once 03/28/16 2259 03/28/16 2306      Medications: Scheduled Meds: . antiseptic oral rinse  7 mL Mouth Rinse 10 times per day  . arformoterol  15 mcg Nebulization BID  . budesonide (PULMICORT) nebulizer solution  0.25 mg Nebulization BID  . chlorhexidine gluconate (SAGE KIT)  15 mL Mouth Rinse  BID  . enoxaparin (LOVENOX) injection  40 mg Subcutaneous Q24H  . feeding supplement (PRO-STAT SUGAR FREE 64)  60 mL Per Tube BID  . feeding supplement (VITAL HIGH PROTEIN)  1,000 mL Per Tube Q24H  . furosemide  20 mg Intravenous Daily  . insulin aspart  0-20 Units Subcutaneous Q12H  . levothyroxine  100 mcg Intravenous Daily  . pantoprazole (PROTONIX) IV  40 mg Intravenous QHS  . sodium chloride flush  10-40 mL Intracatheter Q12H   Continuous Infusions: . Marland KitchenTPN (CLINIMIX-E) Adult 83 mL/hr at 04/08/16 1708  . sodium chloride 10 mL/hr at 04/07/16 1800  . Marland KitchenTPN (CLINIMIX-E) Adult     And  . fat emulsion    . fentaNYL infusion INTRAVENOUS 400 mcg/hr (04/09/16 1100)  . midazolam (VERSED)  infusion 10 mg/hr (04/09/16 1100)  . norepinephrine (LEVOPHED) Adult infusion 13 mcg/min (04/09/16 1100)   PRN Meds:.acetaminophen (TYLENOL) oral liquid 160 mg/5 mL, albuterol, fentaNYL, iopamidol, midazolam, [DISCONTINUED] ondansetron **OR** ondansetron (ZOFRAN) IV, sodium chloride flush  Assessment/Plan: Patient Active Problem List   Diagnosis Date Noted  . Acute respiratory failure ([place])   . Pressure ulcer 04/01/2016  . 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 Hypothyroid 03/29/2016  . Morbid obesity ([place]) 03/29/2016  . Open wound of abdomen 03/29/2016  . 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 Obesity 11/12/2012   s/p Procedure(s): EXPLORATORY LAPAROTOMY, PLACEMENT OF ABRA ABDOMINAL WALL CLOSURE SET  04/04/2016  abra "move" today Continue TPN Continue ventilator and pressors per PCCM    LOS: 12 days   Mickeal Skinner, MD Pg# [phone] [facility]

## 2016-04-09 NOTE — Op Note (Signed)
Preoperative diagnosis: open abdomen  Postoperative diagnosis: open abdomen   Procedure: VAC change > 125cm^2, myofascial advancement  Surgeon: Gurney Maxin, M.D.  Asst: Jomarie Longs  Anesthesia: in ICU with IV sedation and ventilator  Indications for procedure: [name] is a 60 y.o. year old female with symptoms of ischemic end colostomy who underwent resection and laparotomy with open abdomen.  Description of procedure: The patient was prepped and draped in the usual sterile fashion. A time out was performed. Next the previous vac was taken down. The fascia defect was measured at 7.5cm. The myofascial advancement maneuver was performed twice. The bands were adjusted to appropriate length. A final advancement maneuver was performed. The fascial defect was then measured as 6cm. The wound vac was replaced in usual fashion with bridging to the previous ostomy to avoid skin injury.  Findings: 7.5 --> 6cm defect  Specimen: none  Implant: large black sponge vac  Blood loss: minimal  Local anesthesia:   Complications: none  Gurney Maxin, M.D. General, Bariatric, & Minimally Invasive Surgery [facility]

## 2016-04-09 NOTE — Progress Notes (Signed)
PARENTERAL NUTRITION CONSULT NOTE - FOLLOW UP  Pharmacy Consult: TPN Indication: Intolerance to EN (Expected prolonged ileus)  Allergies  Allergen Reactions  . Penicillins Anaphylaxis, Hives, Shortness Of Breath, Swelling and Other (See Comments)    Angioedema   Patient Measurements: Height: 5\' 6"  (167.6 cm) Weight: (!) 335 lb (151.955 kg) IBW/kg (Calculated) : 59.3  Adjusted Body Weight: 82 kg  Vital Signs: Temp: 98.6 F (37 C) (04/17 0400) Temp Source: Axillary (04/17 0400) BP: 105/38 mmHg (04/17 0700) Pulse Rate: 51 (04/17 0630) Intake/Output from previous day: 04/16 0701 - 04/17 0700 In: 4066.4 [I.V.:1674.4; NG/GT:30; IV Piggyback:200; TPN:2162] Out: 9965 [Urine:9665; Drains:300]  Labs:  Recent Labs  04/07/16 0210 04/08/16 0450 04/09/16 0225  WBC 12.4* 11.0* 11.1*  HGB 8.4* 7.7* 8.3*  HCT 26.5* 24.9* 27.6*  PLT 376 355 417*    Recent Labs  04/07/16 0210 04/08/16 0450 04/08/16 1744 04/09/16 0225  NA 135 136 136 137  K 4.9 3.6 3.7 3.7  CL 107 104 102 101  CO2 23 26 27 29   GLUCOSE 123* 112* 118* 127*  BUN 24* 24* 23* 21*  CREATININE 0.80 0.86 0.84 0.91  CALCIUM 7.6* 7.5* 7.7* 7.9*  MG 2.2 1.6* 1.7 1.7  PHOS 3.2 2.7  --  2.8  PROT  --   --   --  5.7*  ALBUMIN 1.2* 1.2*  --  1.3*  AST  --   --   --  18  ALT  --   --   --  11*  ALKPHOS  --   --   --  60  BILITOT  --   --   --  0.2*  PREALBUMIN  --   --   --  13.0*  TRIG 53  --   --   --    Estimated Creatinine Clearance: 97.5 mL/min (by C-G formula based on Cr of 0.91).   Recent Labs  04/08/16 1740 04/08/16 2004 04/09/16  GLUCAP 102* 112* 91    Insulin Requirements in the past 24 hours:  0 unit of SSI  Admission: 60 YOF who was 2 weeks out from emergency colectomy/colostomy and discharged on 03/25/16.  Noted to have feculent discharge from midline wound when having staples removed and diagnosed with necrotic stoma, peritonitis, and fascial necrosis.  Pharmacy consulted to start TPN due to  expected prolonged ileus.   Assessment: 4-7 Patient in the surgical ICU post-op on 4/6. No muscle or subcutaneous fat depletion noticed per RD assessment. 4-9 Triglycerides 48 on propofol this AM 4-13 TG 49 on 4/12; propofol resumed post-operatively 4/12 and running at 40 mcg/kg/min.   Surgeries/Procedures: ~2 wks pta: emergency colectomy/colostomy (Hartman's) 4/6: colostomy removal and end stapling of stoma, partial colectomy, ex-lap, omentectomy, wound debridement 4/7: lap, VAC change, possible debridement 4/10: Re-exploration of abdomen, Wound VAC change 4/12: Re-exploration of abdomen, placement of abdominal wall closure device 4/13: Trach placed  4/14, 4/17: Wound VAC change  GI: prealbumin 3.2 > 13, continued drain output. TF held 4/16 d/t bilious liquid coming out from mouth/nose.   Endo: hypothyroid on IV Synthroid.  Gout. No hx DM - CBGs low normal, no SSI use Lytes: no lytes in TPN on 4/14 and 4/15 - K+ 3.7 (goal >/= 4 for ileus), Mag 1.7 (goal >/= 2 for ileus), others WNL (CoCa 10.06) Renal: SCr stable, normalized CrCL 73 ml/min - excellent UOP 2.7 ml/kg/hr (Lasix x2), NS at 10 ml/hr Pulm: COPD / asthma. Vent post-op, trached 4/13, on vent with FiO2 40% -  Brovana, Pulmicort Cards: HTN - BP soft on Levophed, brady, CVP 13 Hepatobil: LFTs / tbili / TG WNL.  No lipid with TPN since initiation, only through Propofol which was d/c'ed on 4/13. Neuro: Fentanyl/Versed gtts, off Propofol d/t bradycardia - GCS 8, CPOT 0-1, RASS at goal -2 ID: s/p 10d of Cipro/Flagyl for intra-abd infxn.  CoNS in urine and blood (1 of 2).  Afebrile, WBC 11.1 Best Practices: Lovenox, PPI IV, MC TPN Access: PICC 03/29/16 TPN start date: 03/29/16  Current Nutrition:  Vital HP - held Prostat 68mL BID (30g and 200 kCal per serving) - none received TPN  Nutritional Goals: 1300-1477 kCal and 145-155 gm protein per day    Plan:  - Continue Clinimix E 5/15 at 83 ml/hr + 20% ILE at 10 ml/hr only on Mon and  Thurs to prevent EFAD.  TPN will provide a weekly average of 1551 kCal and 100gm of protein daily.  TPN will exceed kCal need by 5% and meet 70% of protein need.  Unable to meet patient's needs d/t limitations of Clinimix. - Daily multivitamin and trace elements in TPN - Continue SSI, change to Q12H.  Consider d/c'ing if CBGs remain low normal. - Mag sulfate 3gm IV x 1 - KCL x 2 runs.  Lytes were removed from TPN on 4/14 and 4/15 d/t rising K.  Supplement conservatively today and allow K+ to trend up with what is provided in TPN. - F/U with possibility of restarting TF, labs in AM    Christina Castaneda, PharmD, BCPS Pager:  [phone] 04/09/2016, 7:58 AM

## 2016-04-10 LAB — RENAL FUNCTION PANEL
Albumin: 1.3 g/dL — ABNORMAL LOW (ref 3.5–5.0)
Anion gap: 8 (ref 5–15)
BUN: 20 mg/dL (ref 6–20)
CHLORIDE: 100 mmol/L — AB (ref 101–111)
CO2: 27 mmol/L (ref 22–32)
Calcium: 7.7 mg/dL — ABNORMAL LOW (ref 8.9–10.3)
Creatinine, Ser: 0.86 mg/dL (ref 0.44–1.00)
GFR calc Af Amer: >60 mL/min (ref >60)
GFR calc non Af Amer: >60 mL/min (ref >60)
GLUCOSE: 109 mg/dL — AB (ref 65–99)
POTASSIUM: 3.8 mmol/L (ref 3.5–5.1)
Phosphorus: 3.2 mg/dL (ref 2.5–4.6)
Sodium: 135 mmol/L (ref 135–145)

## 2016-04-10 LAB — CBC WITH DIFFERENTIAL/PLATELET
BASOS ABS: 0 10*3/uL (ref 0.0–0.1)
BASOS PCT: 0 %
Eosinophils Absolute: 0.1 10*3/uL (ref 0.0–0.7)
Eosinophils Relative: 1 %
HEMATOCRIT: 27.4 % — AB (ref 36.0–46.0)
HEMOGLOBIN: 8.4 g/dL — AB (ref 12.0–15.0)
LYMPHS ABS: 2.2 10*3/uL (ref 0.7–4.0)
Lymphocytes Relative: 19 %
MCH: 28.8 pg (ref 26.0–34.0)
MCHC: 30.7 g/dL (ref 30.0–36.0)
MCV: 93.8 fL (ref 78.0–100.0)
Monocytes Absolute: 0.9 10*3/uL (ref 0.1–1.0)
Monocytes Relative: 8 %
NEUTROS ABS: 8.3 10*3/uL — AB (ref 1.7–7.7)
Neutrophils Relative %: 72 %
Platelets: 446 10*3/uL — ABNORMAL HIGH (ref 150–400)
RBC: 2.92 MIL/uL — AB (ref 3.87–5.11)
RDW: 16.3 % — ABNORMAL HIGH (ref 11.5–15.5)
WBC: 11.5 10*3/uL — ABNORMAL HIGH (ref 4.0–10.5)

## 2016-04-10 LAB — MAGNESIUM: MAGNESIUM: 1.8 mg/dL (ref 1.7–2.4)

## 2016-04-10 LAB — GLUCOSE, CAPILLARY
GLUCOSE-CAPILLARY: 103 mg/dL — AB (ref 65–99)
GLUCOSE-CAPILLARY: 102 mg/dL — AB (ref 65–99)
GLUCOSE-CAPILLARY: 107 mg/dL — AB (ref 65–99)

## 2016-04-10 MED ORDER — POTASSIUM CHLORIDE 10 MEQ/50ML IV SOLN
10.0000 meq | INTRAVENOUS | Status: AC
  Administered 2016-04-10 (×2): 10 meq via INTRAVENOUS
  Filled 2016-04-10 (×2): qty 50

## 2016-04-10 MED ORDER — TRACE MINERALS CR-CU-MN-SE-ZN 10-1000-500-60 MCG/ML IV SOLN
INTRAVENOUS | Status: AC
  Administered 2016-04-10: 18:00:00 via INTRAVENOUS
  Filled 2016-04-10: qty 1992

## 2016-04-10 MED ORDER — MAGNESIUM SULFATE 2 GM/50ML IV SOLN
2.0000 g | Freq: Once | INTRAVENOUS | Status: AC
  Administered 2016-04-10: 2 g via INTRAVENOUS
  Filled 2016-04-10: qty 50

## 2016-04-10 MED ORDER — FUROSEMIDE 10 MG/ML IJ SOLN
20.0000 mg | Freq: Two times a day (BID) | INTRAMUSCULAR | Status: DC
  Administered 2016-04-10 – 2016-04-18 (×16): 20 mg via INTRAVENOUS
  Filled 2016-04-10 (×16): qty 2

## 2016-04-10 NOTE — Progress Notes (Signed)
PULMONARY / CRITICAL CARE MEDICINE   Name: Christina Castaneda MRN: [id number] DOB: 01-30-1956    ADMISSION DATE:  03/28/2016 CONSULTATION DATE:  03/29/2016  REFERRING MD:  CCS  CHIEF COMPLAINT:  Colostomy bag leaking   60 yo female had diverticulitis with sigmoid colon resection, appendectomy at [place], [place] on 3/20 and required colostomy.Came home to [place] , [place]  Noted to have feculent material from midline wound and present to ER on 4/5  S/p laparotomy, colostomy removal and end stapling of stoma, partial colectomy, omentectomy, debridement of wound--Dr. Hulen Skains, subsequent ruq stoma and washout, subsequent abra placement by Dr Redmond Pulling  SUBJECTIVE:  Remains critically ill on levo gtt Sedated deeply on versed/ fent gtt Afebrile Good UO with lasix  VITAL SIGNS: BP 137/63 mmHg  Pulse 58  Temp(Src) 99 F (37.2 C) (Oral)  Resp 16  Ht 5\' 6"  (1.676 m)  Wt 337 lb (152.862 kg)  BMI 54.42 kg/m2  SpO2 98%  LMP 11/18/2012  HEMODYNAMICS: CVP:  [9 mmHg-10 mmHg] 10 mmHg  VENTILATOR SETTINGS: Vent Mode:  [-] PRVC FiO2 (%):  [40 %] 40 % Set Rate:  [16 bmp] 16 bmp Vt Set:  [550 mL] 550 mL PEEP:  [5 cmH20] 5 cmH20 Plateau Pressure:  [20 cmH20-25 cmH20] 23 cmH20  INTAKE / OUTPUT: I/O last 3 completed shifts: In: 5946.8 [I.V.:2593.8; NG/GT:30; IV Piggyback:200] Out: DI:[id number]; Drains:550]  PHYSICAL EXAMINATION: General: sedated, acutely ill Neuro: RASS -2 HEENT: trach site clean Cardiac: regular Chest: decreased BS BL, still faint b/l wheeze Abd: wound vac, abra closure device Ext: 2+ edema Skin: no rashes  LABS:  BMET  Recent Labs Lab 04/08/16 1744 04/09/16 0225 04/10/16 0358  NA 136 137 135  K 3.7 3.7 3.8  CL 102 101 100*  CO2 27 29 27   BUN 23* 21* 20  CREATININE 0.84 0.91 0.86  GLUCOSE 118* 127* 109*   Electrolytes  Recent Labs Lab 04/08/16 0450 04/08/16 1744 04/09/16 0225 04/10/16 0358 04/10/16 0359  CALCIUM 7.5* 7.7* 7.9* 7.7*  --   MG 1.6* 1.7 1.7  --   1.8  PHOS 2.7  --  2.8 3.2  --    CBC  Recent Labs Lab 04/08/16 0450 04/09/16 0225 04/10/16 0359  WBC 11.0* 11.1* 11.5*  HGB 7.7* 8.3* 8.4*  HCT 24.9* 27.6* 27.4*  PLT 355 417* 446*   Coag's No results for input(s): APTT, INR in the last 168 hours.  Sepsis Markers  Recent Labs Lab 04/05/16 1022 04/06/16 0444 04/07/16 0210  PROCALCITON 0.31 0.26 0.20   ABG  Recent Labs Lab 04/08/16 0423 04/08/16 0453 04/09/16 0433  PHART 7.309* 7.309* 7.385  PCO2ART 53.7* 54.9* 53.4*  PO2ART 169* 170.0* 93.0   Liver Enzymes  Recent Labs Lab 04/05/16 0547  04/08/16 0450 04/09/16 0225 04/10/16 0358  AST 32  --   --  18  --   ALT 15  --   --  11*  --   ALKPHOS 46  --   --  60  --   BILITOT 0.3  --   --  0.2*  --   ALBUMIN 1.1*  < > 1.2* 1.3* 1.3*  < > = values in this interval not displayed.  Cardiac Enzymes No results for input(s): TROPONINI, PROBNP in the last 168 hours.  Glucose  Recent Labs Lab 04/08/16 2004 04/09/16 04/09/16 0821 04/09/16 1957 04/10/16 0744 04/10/16 1138  GLUCAP 112* 91 93 73 103* 102*   Imaging No results found.  STUDIES:  MICROBIOLOGY: Blood Ctx x1 4/9>>> Coag neg staph Urine Ctx 4/9>>> Coag neg staph Tracheal Asp Ctx 4/9>>> yeast Blood Ctx x2 4/5>>> negative Urine Ctx 4/5:  Multiple species present MRSA PCR 4/6:  Negative   ANTIBIOTICS: Cipro 4/5>>> Flagyl 4/5>>> Vanco 4/11 >>  SIGNIFICANT EVENTS: 3/20 Exp lap appendectomy,removal sigmoid colon, bowel repair in setting diverticulitis. [facility] 4/6 Exp Lap , colostomy removal, partial colectomy,omentectom, wound debridement, open wound with wound vac. 4/10 Start TPN  LINES/TUBES: ETT 4/6>>> 4/13 R PICC 4/6>>> L RADIAL ART LINE 4/7>>>4/15 #6 cuffed Trach [employer]) 4/13 >> Rt femoral aline 4/15 >>  DISCUSSION: Maintain deep sedation while open abdomen  ASSESSMENT / PLAN:  PULMONARY A: Acute resp failure , prolonged ventialtion  s/p tracheostomy. Hx of COPD with  asthma, OSA >> wheezing noted 4/14. Tobacco abuse. P:   Continue full vent support >> no weaning until  abd closed Brovana, pulmicort with prn albuterol  CARDIOVASCULAR A:  Septic shock. Hx of HTN. Hypervolemia >> great response to lasix 4/15. Echo - nml LV fn, nml RV P:  Levophed gtt  to keep MAP > 65   RENAL A:   AKI >> resolved. Hypokalemia. Hypomagnesemia. P:   Monitor renal fx, urine outpt Replace electrolytes as needed  lasix to 20 q 12h for equal balance  GASTROINTESTINAL A:   Peritonitis s/p laparotomy. Trickle tube feeds as tolerated per CCS >> held 4/16 due to increase residual/ vomiting P:   Post-op care, TPN per CCS  Protonix for SUP  HEMATOLOGIC A:   Anemia of critical illness. P:  F/u CBC Transfuse for Hb < 7 Lovenox for DVT prevention  INFECTIOUS A:   Septic shock 2nd to peritonitis and cellulitis of ostomy site. Coag neg Staph bacteremia, UTI. Hx of PCN allergy. P:   cipro, flagyl for peritonitis- until open abd dc vancomycin for Coag neg staph x 7ds Rpt blood cx   ENDOCRINE A:   Hx of hypothyroidism. P:   Synthroid IV  NEUROLOGIC A:   Acute metabolic encephalopathy. Bradycardia from diprivan. P:   RASS goal: -2 Versed gtt- minimise /fent gtt   The patient is critically ill with multiple organ systems failure and requires high complexity decision making for assessment and support, frequent evaluation and titration of therapies, application of advanced monitoring technologies and extensive interpretation of multiple databases. Critical Care Time devoted to patient care services described in this note independent of APP time is 35 minutes.   Kara Mead MD. Shade Flood. [facility] Pager [phone] If no response call [phone]    04/10/2016, 12:01 PM

## 2016-04-10 NOTE — Progress Notes (Signed)
Patient ID: Christina Castaneda, female   DOB: 16-Dec-1956, 60 y.o.   MRN: [id number]     [address] [place], [place] [address]    Phone: [phone] FAX: [phone]     Subjective: bp okay with levo.  Sedation decreased, but pt still sedated.  Vomited tf.   Objective:  Vital signs:  Filed Vitals:   04/10/16 0830 04/10/16 0844 04/10/16 0845 04/10/16 0900  BP:      Pulse: 55  54 58  Temp:      TempSrc:      Resp: '16  15 16  ' Height:      Weight:      SpO2: 100% 100% 100% 99%    Last BM Date:  (unknown)  Intake/Output   Yesterday:  04/17 0701 - 04/18 0700 In: 4041.2 [I.V.:1714.2; IV Piggyback:200; JOI:7867] Out: 6720 [Urine:3130; Drains:250] This shift:  Total I/O In: 286 [I.V.:100; TPN:186] Out: -    Physical Exam: General: sedated on vent.  Chest: cta. Vent.  CV:  Pulses intact.  Regular rhythm Abdomen: midline wound with vac in place.  elastemers in place, the move was performed.  Ostomy is pink and viable, no significant air, no stool.  Ext:  SCDs BLE.  Generalized edema.  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[place])   Colocutaneous fistula   Ventilator dependence ([place]) post exp lap with open abd   Hypothyroid   Morbid obesity ([place])   Open wound of abdomen   Pressure ulcer   Acute respiratory failure ([place])    Results:   Labs: Results for orders placed or performed during the hospital encounter of 03/28/16 (from the past 48 hour(s))  Glucose, capillary     Status: None   Collection Time: 04/08/16 12:19 PM  Result Value Ref Range   Glucose-Capillary 98 65 - 99 mg/dL   Comment 1 Notify RN   Glucose, capillary     Status: Abnormal   Collection Time: 04/08/16  5:40 PM  Result Value Ref Range   Glucose-Capillary 102 (H) 65 - 99 mg/dL  Basic metabolic panel     Status: Abnormal   Collection Time: 04/08/16  5:44 PM  Result Value Ref Range    Sodium 136 135 - 145 mmol/L   Potassium 3.7 3.5 - 5.1 mmol/L   Chloride 102 101 - 111 mmol/L   CO2 27 22 - 32 mmol/L   Glucose, Bld 118 (H) 65 - 99 mg/dL   BUN 23 (H) 6 - 20 mg/dL   Creatinine, Ser 0.84 0.44 - 1.00 mg/dL   Calcium 7.7 (L) 8.9 - 10.3 mg/dL   GFR calc non Af Amer >60 >60 mL/min   GFR calc Af Amer >60 >60 mL/min    Comment: (NOTE) The eGFR has been calculated using the CKD EPI equation. This calculation has not been validated in all clinical situations. eGFR's persistently <60 mL/min signify possible Chronic Kidney Disease.    Anion gap 7 5 - 15  Magnesium     Status: None   Collection Time: 04/08/16  5:44 PM  Result Value Ref Range   Magnesium 1.7 1.7 - 2.4 mg/dL  Glucose, capillary     Status: Abnormal   Collection Time: 04/08/16  8:04 PM  Result Value Ref Range   Glucose-Capillary 112 (H) 65 - 99 mg/dL   Comment 1 Arterial Specimen  Comment 2 Notify RN   Glucose, capillary     Status: None   Collection Time: 04/09/16 12:00 AM  Result Value Ref Range   Glucose-Capillary 91 65 - 99 mg/dL   Comment 1 Notify RN    Comment 2 Document in Chart   Comprehensive metabolic panel     Status: Abnormal   Collection Time: 04/09/16  2:25 AM  Result Value Ref Range   Sodium 137 135 - 145 mmol/L   Potassium 3.7 3.5 - 5.1 mmol/L   Chloride 101 101 - 111 mmol/L   CO2 29 22 - 32 mmol/L   Glucose, Bld 127 (H) 65 - 99 mg/dL   BUN 21 (H) 6 - 20 mg/dL   Creatinine, Ser 0.91 0.44 - 1.00 mg/dL   Calcium 7.9 (L) 8.9 - 10.3 mg/dL   Total Protein 5.7 (L) 6.5 - 8.1 g/dL   Albumin 1.3 (L) 3.5 - 5.0 g/dL   AST 18 15 - 41 U/L   ALT 11 (L) 14 - 54 U/L   Alkaline Phosphatase 60 38 - 126 U/L   Total Bilirubin 0.2 (L) 0.3 - 1.2 mg/dL   GFR calc non Af Amer >60 >60 mL/min   GFR calc Af Amer >60 >60 mL/min    Comment: (NOTE) The eGFR has been calculated using the CKD EPI equation. This calculation has not been validated in all clinical situations. eGFR's persistently <60 mL/min  signify possible Chronic Kidney Disease.    Anion gap 7 5 - 15  Magnesium     Status: None   Collection Time: 04/09/16  2:25 AM  Result Value Ref Range   Magnesium 1.7 1.7 - 2.4 mg/dL  Phosphorus     Status: None   Collection Time: 04/09/16  2:25 AM  Result Value Ref Range   Phosphorus 2.8 2.5 - 4.6 mg/dL  Prealbumin     Status: Abnormal   Collection Time: 04/09/16  2:25 AM  Result Value Ref Range   Prealbumin 13.0 (L) 18 - 38 mg/dL  CBC with Differential/Platelet     Status: Abnormal   Collection Time: 04/09/16  2:25 AM  Result Value Ref Range   WBC 11.1 (H) 4.0 - 10.5 K/uL   RBC 2.94 (L) 3.87 - 5.11 MIL/uL   Hemoglobin 8.3 (L) 12.0 - 15.0 g/dL   HCT 27.6 (L) 36.0 - 46.0 %   MCV 93.9 78.0 - 100.0 fL   MCH 28.2 26.0 - 34.0 pg   MCHC 30.1 30.0 - 36.0 g/dL   RDW 15.9 (H) 11.5 - 15.5 %   Platelets 417 (H) 150 - 400 K/uL   Neutrophils Relative % 79 %   Neutro Abs 8.8 (H) 1.7 - 7.7 K/uL   Lymphocytes Relative 15 %   Lymphs Abs 1.6 0.7 - 4.0 K/uL   Monocytes Relative 5 %   Monocytes Absolute 0.5 0.1 - 1.0 K/uL   Eosinophils Relative 1 %   Eosinophils Absolute 0.1 0.0 - 0.7 K/uL   Basophils Relative 0 %   Basophils Absolute 0.0 0.0 - 0.1 K/uL  I-STAT 3, arterial blood gas (G3+)     Status: Abnormal   Collection Time: 04/09/16  4:33 AM  Result Value Ref Range   pH, Arterial 7.385 7.350 - 7.450   pCO2 arterial 53.4 (H) 35.0 - 45.0 mmHg   pO2, Arterial 93.0 80.0 - 100.0 mmHg   Bicarbonate 31.9 (H) 20.0 - 24.0 mEq/L   TCO2 34 0 - 100 mmol/L   O2 Saturation 97.0 %  Acid-Base Excess 6.0 (H) 0.0 - 2.0 mmol/L   Patient temperature 98.6 F    Collection site ARTERIAL LINE    Drawn by Nurse    Sample type ARTERIAL   Glucose, capillary     Status: None   Collection Time: 04/09/16  8:21 AM  Result Value Ref Range   Glucose-Capillary 93 65 - 99 mg/dL   Comment 1 Venous Specimen   Glucose, capillary     Status: None   Collection Time: 04/09/16  7:57 PM  Result Value Ref Range    Glucose-Capillary 73 65 - 99 mg/dL   Comment 1 Notify RN    Comment 2 Document in Chart   Renal function panel     Status: Abnormal   Collection Time: 04/10/16  3:58 AM  Result Value Ref Range   Sodium 135 135 - 145 mmol/L   Potassium 3.8 3.5 - 5.1 mmol/L   Chloride 100 (L) 101 - 111 mmol/L   CO2 27 22 - 32 mmol/L   Glucose, Bld 109 (H) 65 - 99 mg/dL   BUN 20 6 - 20 mg/dL   Creatinine, Ser 0.86 0.44 - 1.00 mg/dL   Calcium 7.7 (L) 8.9 - 10.3 mg/dL   Phosphorus 3.2 2.5 - 4.6 mg/dL   Albumin 1.3 (L) 3.5 - 5.0 g/dL   GFR calc non Af Amer >60 >60 mL/min   GFR calc Af Amer >60 >60 mL/min    Comment: (NOTE) The eGFR has been calculated using the CKD EPI equation. This calculation has not been validated in all clinical situations. eGFR's persistently <60 mL/min signify possible Chronic Kidney Disease.    Anion gap 8 5 - 15  CBC with Differential/Platelet     Status: Abnormal   Collection Time: 04/10/16  3:59 AM  Result Value Ref Range   WBC 11.5 (H) 4.0 - 10.5 K/uL   RBC 2.92 (L) 3.87 - 5.11 MIL/uL   Hemoglobin 8.4 (L) 12.0 - 15.0 g/dL   HCT 27.4 (L) 36.0 - 46.0 %   MCV 93.8 78.0 - 100.0 fL   MCH 28.8 26.0 - 34.0 pg   MCHC 30.7 30.0 - 36.0 g/dL   RDW 16.3 (H) 11.5 - 15.5 %   Platelets 446 (H) 150 - 400 K/uL   Neutrophils Relative % 72 %   Neutro Abs 8.3 (H) 1.7 - 7.7 K/uL   Lymphocytes Relative 19 %   Lymphs Abs 2.2 0.7 - 4.0 K/uL   Monocytes Relative 8 %   Monocytes Absolute 0.9 0.1 - 1.0 K/uL   Eosinophils Relative 1 %   Eosinophils Absolute 0.1 0.0 - 0.7 K/uL   Basophils Relative 0 %   Basophils Absolute 0.0 0.0 - 0.1 K/uL  Magnesium     Status: None   Collection Time: 04/10/16  3:59 AM  Result Value Ref Range   Magnesium 1.8 1.7 - 2.4 mg/dL  Glucose, capillary     Status: Abnormal   Collection Time: 04/10/16  7:44 AM  Result Value Ref Range   Glucose-Capillary 103 (H) 65 - 99 mg/dL   Comment 1 Capillary Specimen     Imaging / Studies: Dg Chest Port 1  View  04/09/2016  CLINICAL DATA:  60 year old female with respiratory failure. Subsequent encounter. EXAM: PORTABLE CHEST 1 VIEW COMPARISON:  04/08/2016. FINDINGS: Tracheostomy tube tip midline. Right central line tip distal superior vena cava level. Feeding tube in place with tip not imaged on present exam. Mild cardiomegaly. Mild pulmonary vascular prominence most notable centrally.  Right infrahilar consolidation may represent atelectasis rather than infiltrate. Attention to this on follow-up. IMPRESSION: Mild pulmonary vascular prominence most notable centrally. Right infrahilar consolidation may represent atelectasis rather than infiltrate. Attention to this on follow-up. Electronically Signed   By: Genia Del M.D.   On: 04/09/2016 08:04    Medications / Allergies:  Scheduled Meds: . antiseptic oral rinse  7 mL Mouth Rinse 10 times per day  . arformoterol  15 mcg Nebulization BID  . budesonide (PULMICORT) nebulizer solution  0.25 mg Nebulization BID  . chlorhexidine gluconate (SAGE KIT)  15 mL Mouth Rinse BID  . enoxaparin (LOVENOX) injection  40 mg Subcutaneous Q24H  . feeding supplement (PRO-STAT SUGAR FREE 64)  60 mL Per Tube BID  . feeding supplement (VITAL HIGH PROTEIN)  1,000 mL Per Tube Q24H  . furosemide  20 mg Intravenous Daily  . levothyroxine  100 mcg Intravenous Daily  . magnesium sulfate 1 - 4 g bolus IVPB  2 g Intravenous Once  . pantoprazole (PROTONIX) IV  40 mg Intravenous QHS  . potassium chloride  10 mEq Intravenous Q1 Hr x 2  . sodium chloride flush  10-40 mL Intracatheter Q12H   Continuous Infusions: . [place] KitchenTPN (CLINIMIX-E) Adult    . sodium chloride 10 mL/hr at 04/10/16 0700  . [place] KitchenTPN (CLINIMIX-E) Adult 83 mL/hr at 04/09/16 1730   And  . fat emulsion 240 mL (04/09/16 1730)  . fentaNYL infusion INTRAVENOUS 400 mcg/hr (04/10/16 0700)  . midazolam (VERSED) infusion 5 mg/hr (04/10/16 0800)  . norepinephrine (LEVOPHED) Adult infusion 13 mcg/min (04/10/16 0215)   PRN  Meds:.acetaminophen (TYLENOL) oral liquid 160 mg/5 mL, albuterol, fentaNYL, iopamidol, midazolam, [DISCONTINUED] ondansetron **OR** ondansetron (ZOFRAN) IV, sodium chloride flush  Antibiotics: Anti-infectives    Start     Dose/Rate Route Frequency Ordered Stop   04/03/16 2100  vancomycin (VANCOCIN) 1,500 mg in sodium chloride 0.9 % 500 mL IVPB  Status:  Discontinued     1,500 mg 250 mL/hr over 120 Minutes Intravenous Every 12 hours 04/02/16 2020 04/06/16 0943   04/02/16 2030  vancomycin (VANCOCIN) 2,500 mg in sodium chloride 0.9 % 500 mL IVPB     2,500 mg 250 mL/hr over 120 Minutes Intravenous  Once 04/02/16 2020 04/02/16 2306   03/29/16 1200  ciprofloxacin (CIPRO) IVPB 400 mg  Status:  Discontinued     400 mg 200 mL/hr over 60 Minutes Intravenous Every 12 hours 03/29/16 0213 04/06/16 0943   03/29/16 0600  [MAR Hold]  ciprofloxacin (CIPRO) IVPB 400 mg     (MAR Hold since 03/28/16 2357)   400 mg 200 mL/hr over 60 Minutes Intravenous On call to O.R. 03/28/16 2306 03/28/16 2355   03/29/16 0600  [MAR Hold]  metroNIDAZOLE (FLAGYL) IVPB 500 mg     (MAR Hold since 03/28/16 2357)   500 mg 100 mL/hr over 60 Minutes Intravenous On call to O.R. 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 Hulen Skains, subsequent ruq stoma and washout, subsequent abra placement by Dr Redmond Pulling - change vac on  - hold to TF, will consider trickle TF  once ostomy function increases and no n/v Hypotension-on levo Respiratory-full vent support, appreciate CCM assistance, s/p trach.  MAY WEAN TOWARDS EXTUBATION WITH ABRA DEVICE..  VTE prophylaxis-SCD/lovenox Neuro-versed and fentanyl for sedation, weaning some PCM-TPN started 4/6. Vomited, TF stopped  Dispo-ICU    Erby Pian, [facility] Surgery Pager [phone]) For consults and floor pages call [phone]([id number])  04/10/2016 10:32 AM

## 2016-04-10 NOTE — Progress Notes (Signed)
PARENTERAL NUTRITION CONSULT NOTE - FOLLOW UP  Pharmacy Consult for TPN Indication: Intolerance to EN (expected to prolong ileus)  Allergies  Allergen Reactions  . Penicillins Anaphylaxis, Hives, Shortness Of Breath, Swelling and Other (See Comments)    Angioedema    Patient Measurements: Height: _0  (167.6 cm) Weight: (!) 337 lb (152.862 kg) IBW/kg (Calculated) : 59.3 Adjusted Body Weight:  Usual Weight:   Vital Signs: Temp: 98.4 F (36.9 C) (04/18 0411) Temp Source: Axillary (04/18 0411) BP: 137/63 mmHg (04/18 0316) Pulse Rate: 63 (04/18 0645) Intake/Output from previous day: 04/17 0701 - 04/18 0700 In: 3888.2 [I.V.:1654.2; IV Piggyback:200; TPN:2034] Out: 1950 [Urine:3130; Drains:250] Intake/Output from this shift:    Labs:  Recent Labs  04/08/16 0450 04/09/16 0225 04/10/16 0359  WBC 11.0* 11.1* 11.5*  HGB 7.7* 8.3* 8.4*  HCT 24.9* 27.6* 27.4*  PLT 355 417* 446*     Recent Labs  04/08/16 0450 04/08/16 1744 04/09/16 0225 04/10/16 0358 04/10/16 0359  NA 136 136 137 135  --   K 3.6 3.7 3.7 3.8  --   CL 104 102 101 100*  --   CO2 _1 --   GLUCOSE 112* 118* 127* 109*  --   BUN 24* 23* 21* 20  --   CREATININE 0.86 0.84 0.91 0.86  --   CALCIUM 7.5* 7.7* 7.9* 7.7*  --   MG 1.6* 1.7 1.7  --  1.8  PHOS 2.7  --  2.8 3.2  --   PROT  --   --  5.7*  --   --   ALBUMIN 1.2*  --  1.3* 1.3*  --   AST  --   --  18  --   --   ALT  --   --  11*  --   --   ALKPHOS  --   --  60  --   --   BILITOT  --   --  0.2*  --   --   PREALBUMIN  --   --  13.0*  --   --    Estimated Creatinine Clearance: 103.5 mL/min (by C-G formula based on Cr of 0.86).    Recent Labs  04/09/16 04/09/16 0821 04/09/16 1957  GLUCAP 91 93 73    Medications:  Scheduled:  . antiseptic oral rinse  7 mL Mouth Rinse 10 times per day  . arformoterol  15 mcg Nebulization BID  . budesonide (PULMICORT) nebulizer solution  0.25 mg Nebulization BID  . chlorhexidine gluconate (SAGE  KIT)  15 mL Mouth Rinse BID  . enoxaparin (LOVENOX) injection  40 mg Subcutaneous Q24H  . feeding supplement (PRO-STAT SUGAR FREE 64)  60 mL Per Tube BID  . feeding supplement (VITAL HIGH PROTEIN)  1,000 mL Per Tube Q24H  . furosemide  20 mg Intravenous Daily  . insulin aspart  0-20 Units Subcutaneous Q12H  . levothyroxine  100 mcg Intravenous Daily  . pantoprazole (PROTONIX) IV  40 mg Intravenous QHS  . sodium chloride flush  10-40 mL Intracatheter Q12H    Insulin Requirements in the past 24 hours:  0 unit of SSI  Admission: 60 YOF who was 2 weeks out from emergency colectomy/colostomy and discharged on 03/25/16. Noted to have feculent discharge from midline wound when having staples removed and diagnosed with necrotic stoma, peritonitis, and fascial necrosis. Pharmacy consulted to start TPN due to expected prolonged ileus.   Assessment: 4-7 Patient in the surgical ICU post-op on 4/6.  No muscle or subcutaneous fat depletion noticed per RD assessment. 4-9 Triglycerides 48 on propofol this AM 4-13 TG 49 on 4/12; propofol resumed post-operatively 4/12 and running at 40 mcg/kg/min.   Surgeries/Procedures: ~2 wks pta: emergency colectomy/colostomy (Hartman's) 4/6: colostomy removal and end stapling of stoma, partial colectomy, ex-lap, omentectomy, wound debridement 4/7: lap, VAC change, possible debridement 4/10: Re-exploration of abdomen, Wound VAC change 4/12: Re-exploration of abdomen, placement of abdominal wall closure device 4/13: Trach placed  4/14, 4/17: Wound VAC change  GI: prealbumin 3.2 > 13, continued drain output. TF held 4/16 d/t bilious liquid coming out from mouth/nose.  Endo: hypothyroid on IV Synthroid. Gout. No hx DM - CBGs low normal, no SSI use Lytes: no lytes in TPN on 4/14 and 4/15 - K+ 3.8 (goal >/= 4 for ileus; rec'd 2 runs on 4/17), Mag 1.8 (goal >/= 2 for ileus; rec'd 3g on 4/17), others WNL (CoCa 9.86) Renal: SCr stable, normalized CrCL 73 ml/min -  excellent UOP 0.9 ml/kg/hr (Lasix x2), NS at 10 ml/hr.  Furosemide 66m IV qday Pulm: COPD / asthma. Vent post-op, trached 4/13, on vent with FiO2 40% - Brovana, Pulmicort Cards: HTN - BP soft on Levophed, brady, CVP 10 Hepatobil: LFTs / tbili / TG WNL. No lipid with TPN since initiation, only through Propofol which was d/c'ed on 4/13. Neuro: Fentanyl/Versed gtts, off Propofol d/t bradycardia - GCS 9, CPOT 0-1, RASS at goal -2 ID: s/p 10d of Cipro/Flagyl for intra-abd infxn. CoNS in urine and blood (1 of 2). Afebrile, WBC 11.5  Best Practices: Lovenox, PPI IV, MC TPN Access: PICC 03/29/16 TPN start date: 03/29/16  Current Nutrition:  Vital HP - held Prostat 636mBID (30g and 200 kCal per serving) - none received TPN  Nutritional Goals: 1300-1477 kCal and 145-155 gm protein per day    Plan:  - Continue Clinimix E 5/15 at 83 ml/hr + 20% ILE at 10 ml/hr only on Mon and Thurs to prevent EFAD. TPN will provide a weekly average of 1551 kCal and 100gm of protein daily. TPN will exceed kCal need by 5% and meet 70% of protein need. Unable to meet patient's needs d/t limitations of Clinimix. - Daily multivitamin and trace elements in TPN - D/C SSI - Mag sulfate 2gm IV x 1 - KCL x 2 runs. Lytes were removed from TPN on 4/14 and 4/15 d/t rising K. Supplement conservatively today and allow K+ to trend up with what is provided in TPN. - F/U with possibility of restarting TF, labs in AM  KeGracy BruinsPharmD Clinical Pharmacist [facility]

## 2016-04-10 NOTE — Progress Notes (Signed)
Report obtained at this time.  Will assume care of pt at this time.

## 2016-04-11 ENCOUNTER — Inpatient Hospital Stay (HOSPITAL_COMMUNITY): Payer: BLUE CROSS/BLUE SHIELD

## 2016-04-11 LAB — RENAL FUNCTION PANEL
ALBUMIN: 1.4 g/dL — AB (ref 3.5–5.0)
Anion gap: 8 (ref 5–15)
BUN: 21 mg/dL — AB (ref 6–20)
CO2: 30 mmol/L (ref 22–32)
Calcium: 7.9 mg/dL — ABNORMAL LOW (ref 8.9–10.3)
Chloride: 98 mmol/L — ABNORMAL LOW (ref 101–111)
Creatinine, Ser: 0.86 mg/dL (ref 0.44–1.00)
GFR calc Af Amer: >60 mL/min (ref >60)
GFR calc non Af Amer: >60 mL/min (ref >60)
GLUCOSE: 110 mg/dL — AB (ref 65–99)
PHOSPHORUS: 3.9 mg/dL (ref 2.5–4.6)
POTASSIUM: 4 mmol/L (ref 3.5–5.1)
Sodium: 136 mmol/L (ref 135–145)

## 2016-04-11 LAB — CBC WITH DIFFERENTIAL/PLATELET
BASOS ABS: 0 10*3/uL (ref 0.0–0.1)
Basophils Relative: 0 %
EOS PCT: 1 %
Eosinophils Absolute: 0.1 10*3/uL (ref 0.0–0.7)
HEMATOCRIT: 24.8 % — AB (ref 36.0–46.0)
Hemoglobin: 7.6 g/dL — ABNORMAL LOW (ref 12.0–15.0)
LYMPHS ABS: 1.8 10*3/uL (ref 0.7–4.0)
LYMPHS PCT: 20 %
MCH: 28.8 pg (ref 26.0–34.0)
MCHC: 30.6 g/dL (ref 30.0–36.0)
MCV: 93.9 fL (ref 78.0–100.0)
MONO ABS: 0.8 10*3/uL (ref 0.1–1.0)
MONOS PCT: 9 %
Neutro Abs: 6.3 10*3/uL (ref 1.7–7.7)
Neutrophils Relative %: 70 %
PLATELETS: 383 10*3/uL (ref 150–400)
RBC: 2.64 MIL/uL — ABNORMAL LOW (ref 3.87–5.11)
RDW: 16.4 % — AB (ref 11.5–15.5)
WBC: 8.9 10*3/uL (ref 4.0–10.5)

## 2016-04-11 LAB — GLUCOSE, CAPILLARY
GLUCOSE-CAPILLARY: 101 mg/dL — AB (ref 65–99)
GLUCOSE-CAPILLARY: 95 mg/dL (ref 65–99)
Glucose-Capillary: 93 mg/dL (ref 65–99)

## 2016-04-11 LAB — BASIC METABOLIC PANEL
Anion gap: 8 (ref 5–15)
BUN: 21 mg/dL — AB (ref 6–20)
CALCIUM: 7.9 mg/dL — AB (ref 8.9–10.3)
CO2: 30 mmol/L (ref 22–32)
Chloride: 98 mmol/L — ABNORMAL LOW (ref 101–111)
Creatinine, Ser: 0.89 mg/dL (ref 0.44–1.00)
GFR calc Af Amer: >60 mL/min (ref >60)
GLUCOSE: 111 mg/dL — AB (ref 65–99)
Potassium: 4 mmol/L (ref 3.5–5.1)
Sodium: 136 mmol/L (ref 135–145)

## 2016-04-11 LAB — MAGNESIUM: Magnesium: 2.1 mg/dL (ref 1.7–2.4)

## 2016-04-11 MED ORDER — TRACE MINERALS CR-CU-MN-SE-ZN 10-1000-500-60 MCG/ML IV SOLN
INTRAVENOUS | Status: AC
  Administered 2016-04-11: 18:00:00 via INTRAVENOUS
  Filled 2016-04-11: qty 1992

## 2016-04-11 NOTE — Progress Notes (Addendum)
Nutrition Follow Up  DOCUMENTATION CODES:   Morbid obesity  INTERVENTION:    TPN per pharmacy   Restart Vital HP formula at trickle rate of 10 ml/hr?  NUTRITION DIAGNOSIS:   Inadequate oral intake related to inability to eat as evidenced by NPO status, ongoing  GOAL:   Provide needs based on ASPEN/SCCM guidelines, progressing  MONITOR:   Vent status, Labs, Weight trends, Skin, I & O's (TPN prescription)  ASSESSMENT:   60 y.o. Female presented with postoperative complication. 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 She went to a nearby hospital and was diagnosed with perforated diverticulitis. She underwent an exploratory laparotomy with sigmoid colon resection on March 20. She was discharged from the hospital there after a week and just returned home to [place] 5 days ago. Yesterday she reports developing leakage of fecal matter from her midline surgical incision site. She continues to have normal ostomy output from her left lower quadrant stoma. Over the last 24 hours she has also experienced increased generalized abdominal pain.  Patient s/p procedure 4/6: COLOSTOMY REMOVAL AND END STAPLING OF STOMA PARTIAL COLECTOMY EXPLORATORY LAPAROTOMY OMENTECTOMY DEBRIDEMENT WOUND  Patient is currently intubated on ventilator support Temp (24hrs), Avg:99.1 F (37.3 C), Min:98.4 F (36.9 C), Max:99.6 F (37.6 C)   Vital HP formula started 4/13 and stopped 4/15 due to pt vomiting.   CORTRAK small bore feeding tube remains in place. Tube tip was unable to be advanced post-pyloric per IR.  Patient is receiving TPN with Clinimix E 5/15 @ 83 ml/hr + 20% ILE at 10 ml/hr on Monday and Thursday only to prevent EFAD.  This provides a weekly average of 1551 kcal and 100 gm of protein per day.  Meets 100% minimum estimated energy needs and 69% minimum estimated protein needs.  Diet Order:  Diet NPO time  specified .TPN (CLINIMIX-E) Adult .TPN (CLINIMIX-E) Adult  Skin:  open abdomen with ABRA and VAC in place  Last BM:  N/A  Height:   Ht Readings from Last 1 Encounters:  04/07/16 5\' 6"  (1.676 m)    Weight:   Wt Readings from Last 1 Encounters:  04/11/16 344 lb (156.037 kg)    Ideal Body Weight:  59 kg  BMI:  Body mass index is 55.55 kg/(m^2).  Estimated Nutritional Needs:   Kcal:  [id number]  Protein:  145-155 gm  Fluid:  per MD  EDUCATION NEEDS:   No education needs identified at this time  Arthur Holms, RD, LDN Pager #: [phone] After-Hours Pager #: [phone]

## 2016-04-11 NOTE — Progress Notes (Signed)
PULMONARY / CRITICAL CARE MEDICINE   Name: Christina Castaneda MRN: [id number] DOB: 25-Jul-1956    ADMISSION DATE:  03/28/2016 CONSULTATION DATE:  03/29/2016  REFERRING MD:  CCS  CHIEF COMPLAINT:  Colostomy bag leaking  60 yo female had diverticulitis with sigmoid colon resection, appendectomy at [facility] on 3/20 and required colostomy.Came home to [place] , [place]  Noted to have feculent material from midline wound and present to ER on 4/5  S/p laparotomy, colostomy removal and end stapling of stoma, partial colectomy, omentectomy, debridement of wound--Dr. Hulen Skains, subsequent ruq stoma and washout, subsequent abra placement by Dr Redmond Pulling  SUBJECTIVE:  Remains critically ill on levo gtt & sedated deeply on versed/ fent gtt due to open abdomen Afebrile Even balance with lasix  VITAL SIGNS: BP 143/71 mmHg  Pulse 58  Temp(Src) 99.2 F (37.3 C) (Axillary)  Resp 16  Ht 5\' 6"  (1.676 m)  Wt 344 lb (156.037 kg)  BMI 55.55 kg/m2  SpO2 100%  LMP 11/18/2012  HEMODYNAMICS: CVP:  [10 mmHg-11 mmHg] 11 mmHg  VENTILATOR SETTINGS: Vent Mode:  [-] PRVC FiO2 (%):  [40 %] 40 % Set Rate:  [16 bmp] 16 bmp Vt Set:  [550 mL] 550 mL PEEP:  [5 cmH20] 5 cmH20 Plateau Pressure:  [20 cmH20-24 cmH20] 20 cmH20  INTAKE / OUTPUT: I/O last 3 completed shifts: In: 5333.8 [I.V.:2190.5; IV Piggyback:100] Out: MB:[id number]; Drains:600]  PHYSICAL EXAMINATION: General: sedated, acutely ill Neuro: RASS -2 HEENT: trach site clean Cardiac: regular Chest: decreased BS BL, still faint b/l wheeze Abd: wound vac, abra closure device Ext: 2+ edema Skin: no rashes  LABS:  BMET  Recent Labs Lab 04/09/16 0225 04/10/16 0358 04/11/16 0340  NA 137 135 136  136  K 3.7 3.8 4.0  4.0  CL 101 100* 98*  98*  CO2 29 27 30  30   BUN 21* 20 21*  21*  CREATININE 0.91 0.86 0.86  0.89  GLUCOSE 127* 109* 110*  111*   Electrolytes  Recent Labs Lab 04/09/16 0225 04/10/16 0358 04/10/16 0359 04/11/16 0340   CALCIUM 7.9* 7.7*  --  7.9*  7.9*  MG 1.7  --  1.8 2.1  PHOS 2.8 3.2  --  3.9   CBC  Recent Labs Lab 04/09/16 0225 04/10/16 0359 04/11/16 0340  WBC 11.1* 11.5* 8.9  HGB 8.3* 8.4* 7.6*  HCT 27.6* 27.4* 24.8*  PLT 417* 446* 383   Coag's No results for input(s): APTT, INR in the last 168 hours.  Sepsis Markers  Recent Labs Lab 04/05/16 1022 04/06/16 0444 04/07/16 0210  PROCALCITON 0.31 0.26 0.20   ABG  Recent Labs Lab 04/08/16 0423 04/08/16 0453 04/09/16 0433  PHART 7.309* 7.309* 7.385  PCO2ART 53.7* 54.9* 53.4*  PO2ART 169* 170.0* 93.0   Liver Enzymes  Recent Labs Lab 04/05/16 0547  04/09/16 0225 04/10/16 0358 04/11/16 0340  AST 32  --  18  --   --   ALT 15  --  11*  --   --   ALKPHOS 46  --  60  --   --   BILITOT 0.3  --  0.2*  --   --   ALBUMIN 1.1*  < > 1.3* 1.3* 1.4*  < > = values in this interval not displayed.  Cardiac Enzymes No results for input(s): TROPONINI, PROBNP in the last 168 hours.  Glucose  Recent Labs Lab 04/09/16 0821 04/09/16 1957 04/10/16 0744 04/10/16 1138 04/10/16 2310 04/11/16 0356  GLUCAP 93 73 103*  102* 107* 93   Imaging Dg Chest Port 1 View  04/11/2016  CLINICAL DATA:  Respiratory failure EXAM: PORTABLE CHEST 1 VIEW COMPARISON:  April 09, 2016 FINDINGS: Tracheostomy catheter tip is 5.4 cm above the carina. Feeding tube tip is below the diaphragm. Central catheter tip is in the superior vena cava. No pneumothorax. There is no edema or consolidation. Heart is borderline enlarged with pulmonary vascularity within normal limits. No adenopathy. IMPRESSION: Tube and catheter positions as described without pneumothorax. No edema or consolidation. Stable cardiac prominence. Electronically Signed   By: Lowella Grip III M.D.   On: 04/11/2016 07:38    STUDIES:    MICROBIOLOGY: Blood Ctx x1 4/9>>> Coag neg staph Urine Ctx 4/9>>> Coag neg staph Tracheal Asp Ctx 4/9>>> yeast Blood Ctx x2 4/5>>> negative Urine Ctx  4/5:  Multiple species present MRSA PCR 4/6:  Negative   ANTIBIOTICS: Cipro 4/5>>> off Flagyl 4/5>>>off Vanco 4/11 >>off  SIGNIFICANT EVENTS: 3/20 Exp lap appendectomy,removal sigmoid colon, bowel repair in setting diverticulitis. [facility] 4/6 Exp Lap , colostomy removal, partial colectomy,omentectom, wound debridement, open wound with wound vac. 4/10 Start TPN  LINES/TUBES: ETT 4/6>>> 4/13 R PICC 4/6>>> L RADIAL ART LINE 4/7>>>4/15 #6 cuffed Trach [employer]) 4/13 >> Rt femoral aline 4/15 >>  DISCUSSION: Maintaining deep sedation while open abdomen  ASSESSMENT / PLAN:  PULMONARY A: Acute resp failure , prolonged ventialtion  s/p tracheostomy. Hx of COPD with asthma, OSA >> wheezing noted 4/14. Tobacco abuse. P:   Continue full vent support >> no weaning until  abd closed Brovana, pulmicort with prn albuterol  CARDIOVASCULAR A:  Septic shock. Hx of HTN. Hypervolemia >> great response to lasix 4/15. Echo - nml LV fn, nml RV P:  Levophed gtt  to keep MAP > 65 -attempt taper   RENAL A:   AKI >> resolved. Hypokalemia. Hypomagnesemia. P:   Monitor renal fx, urine outpt Replace electrolytes as needed  lasix to 20 q 12h for equal balance  GASTROINTESTINAL A:   Peritonitis s/p laparotomy. Trickle tube feeds as tolerated per CCS >> held 4/16 due to increase residual/ vomiting P:   Post-op care, TPN per CCS Protonix for SUP  HEMATOLOGIC A:   Anemia of critical illness. P:  F/u CBC Transfuse for Hb < 7 Lovenox for DVT prevention  INFECTIOUS A:   Septic shock 2nd to peritonitis and cellulitis of ostomy site. Coag neg Staph bacteremia, UTI. Hx of PCN allergy. P:   cipro, flagyl for peritonitis- stopped dc vancomycin for Coag neg staph x 7ds Rpt blood cx to ensure clearance   ENDOCRINE A:   Hx of hypothyroidism. P:   Synthroid IV  NEUROLOGIC A:   Acute metabolic encephalopathy. Bradycardia from diprivan. P:   RASS goal: -2 Versed gtt- minimise /fent  gtt   The patient is critically ill with multiple organ systems failure and requires high complexity decision making for assessment and support, frequent evaluation and titration of therapies, application of advanced monitoring technologies and extensive interpretation of multiple databases. Critical Care Time devoted to patient care services described in this note independent of APP time is 31 minutes.   Kara Mead MD. Shade Flood. [facility] Pager [phone] If no response call [phone]    04/11/2016, 11:59 AM

## 2016-04-11 NOTE — Progress Notes (Signed)
PARENTERAL NUTRITION CONSULT NOTE - FOLLOW UP  Pharmacy Consult for TPN Indication: Intolerance to EN (expected to prolong ileus)  Allergies  Allergen Reactions  . Penicillins Anaphylaxis, Hives, Shortness Of Breath, Swelling and Other (See Comments)    Angioedema    Patient Measurements: Height: _0  (167.6 cm) Weight: (!) 344 lb (156.037 kg) IBW/kg (Calculated) : 59.3 Adjusted Body Weight:  Usual Weight:   Vital Signs: Temp: 98.4 F (36.9 C) (04/19 0400) Temp Source: Oral (04/19 0400) BP: 143/71 mmHg (04/19 0310) Pulse Rate: 54 (04/19 0600) Intake/Output from previous day: 04/18 0701 - 04/19 0700 In: 3322.3 [I.V.:1294.9; IV Piggyback:100; TPN:1927.4] Out: 4025 [Urine:3675; Drains:350] Intake/Output from this shift: Total I/O In: -  Out: 130 [Urine:130]  Labs:  Recent Labs  04/09/16 0225 04/10/16 0359 04/11/16 0340  WBC 11.1* 11.5* 8.9  HGB 8.3* 8.4* 7.6*  HCT 27.6* 27.4* 24.8*  PLT 417* 446* 383     Recent Labs  04/09/16 0225 04/10/16 0358 04/10/16 0359 04/11/16 0340  NA 137 135  --  136  136  K 3.7 3.8  --  4.0  4.0  CL 101 100*  --  98*  98*  CO2 29 27  --  30  30  GLUCOSE 127* 109*  --  110*  111*  BUN 21* 20  --  21*  21*  CREATININE 0.91 0.86  --  0.86  0.89  CALCIUM 7.9* 7.7*  --  7.9*  7.9*  MG 1.7  --  1.8 2.1  PHOS 2.8 3.2  --  3.9  PROT 5.7*  --   --   --   ALBUMIN 1.3* 1.3*  --  1.4*  AST 18  --   --   --   ALT 11*  --   --   --   ALKPHOS 60  --   --   --   BILITOT 0.2*  --   --   --   PREALBUMIN 13.0*  --   --   --    Estimated Creatinine Clearance: 104.9 mL/min (by C-G formula based on Cr of 0.86).    Recent Labs  04/10/16 1138 04/10/16 2310 04/11/16 0356  GLUCAP 102* 107* 93    Medications:  Scheduled:  . antiseptic oral rinse  7 mL Mouth Rinse 10 times per day  . arformoterol  15 mcg Nebulization BID  . budesonide (PULMICORT) nebulizer solution  0.25 mg Nebulization BID  . chlorhexidine gluconate (SAGE  KIT)  15 mL Mouth Rinse BID  . enoxaparin (LOVENOX) injection  40 mg Subcutaneous Q24H  . feeding supplement (PRO-STAT SUGAR FREE 64)  60 mL Per Tube BID  . feeding supplement (VITAL HIGH PROTEIN)  1,000 mL Per Tube Q24H  . furosemide  20 mg Intravenous BID  . levothyroxine  100 mcg Intravenous Daily  . pantoprazole (PROTONIX) IV  40 mg Intravenous QHS  . sodium chloride flush  10-40 mL Intracatheter Q12H    Insulin Requirements in the past 24 hours:  SSI d/c'd  Admission: 60 YOF who was 2 weeks out from emergency colectomy/colostomy and discharged on 03/25/16. Noted to have feculent discharge from midline wound when having staples removed and diagnosed with necrotic stoma, peritonitis, and fascial necrosis. Pharmacy consulted to start TPN due to expected prolonged ileus.   Assessment: 4-7 Patient in the surgical ICU post-op on 4/6. No muscle or subcutaneous fat depletion noticed per RD assessment. 4-9 Triglycerides 48 on propofol this AM 4-13 TG 49 on 4/12; propofol  resumed post-operatively 4/12 and running at 40 mcg/kg/min.   Surgeries/Procedures: ~2 wks pta: emergency colectomy/colostomy (Hartman's) 4/6: colostomy removal and end stapling of stoma, partial colectomy, ex-lap, omentectomy, wound debridement 4/7: lap, VAC change, possible debridement 4/10: Re-exploration of abdomen, Wound VAC change 4/12: Re-exploration of abdomen, placement of abdominal wall closure device 4/13: Trach placed  4/14, 4/17: Wound VAC change  GI: prealbumin 3.2 > 13, continued drain output. TF held 4/16 d/t bilious liquid coming out from mouth/nose.PPI-IV  Endo: hypothyroid on IV Synthroid. Gout. No hx DM - SSI stopped, serum gluc 110. Lytes: no lytes in TPN on 4/14 and 4/15 - K 4 (goal >/= 4 for ileus; rec'd 2 runs on 4/18), Mag 2.1 (goal >/= 2 for ileus; rec'd 3g on 4/18).  Phos 3.9, Ca 7.9 (CoCa 9.98) Renal: SCr stable, normalized CrCL 73 ml/min - UOP 1 ml/kg/hr (Lasix x2), NS at 10 ml/hr.  Furosemide 58m IV qday Pulm: COPD / asthma. Vent post-op, trached 4/13, FiO2 40%, no wean until abd closed.  Brovana, Pulmicort Cards: HTN - BP soft on Levophed, brady, CVP 10 Hepatobil: LFTs / tbili / TG WNL. No lipid with TPN since initiation, only through Propofol which was d/c'ed on 4/13. Neuro: Fentanyl/Versed gtts, off Propofol d/t bradycardia - GCS 9, CPOT 0-1, RASS at goal -2 ID: s/p 10d of Cipro/Flagyl for intra-abd infxn. CoNS in urine and blood (1 of 2). Afebrile, WBC wnl this AM.  New blood cx drawn 4/18, pending.  Alerted Dr AElsworth Sohothat abx were stopped by surgery on 4/14.  Best Practices: Lovenox, PPI IV, MC TPN Access: PICC 03/29/16 TPN start date: 03/29/16  Current Nutrition:  Vital HP - held Prostat 690mBID (30g and 200 kCal per serving) - none received TPN  Nutritional Goals: 1300-1477 kCal and 145-155 gm protein per day    Plan:  - Continue Clinimix E 5/15 at 83 ml/hr + 20% ILE at 10 ml/hr only on Mon and Thurs to prevent EFAD. TPN will provide a weekly average of 1551 kCal and 100gm of protein daily. TPN will exceed kCal need by 5% and meet 70% of protein need. Unable to meet patient's needs d/t limitations of Clinimix. - Daily multivitamin and trace elements in TPN - TPN labs Mon/Thurs - F/U with possibility of restarting TF, labs in AM  KeGracy BruinsPharmD Clinical Pharmacist [facility]

## 2016-04-12 LAB — CBC WITH DIFFERENTIAL/PLATELET
BASOS ABS: 0 10*3/uL (ref 0.0–0.1)
BASOS PCT: 1 %
Eosinophils Absolute: 0.1 10*3/uL (ref 0.0–0.7)
Eosinophils Relative: 1 %
HEMATOCRIT: 25.4 % — AB (ref 36.0–46.0)
HEMOGLOBIN: 7.5 g/dL — AB (ref 12.0–15.0)
Lymphocytes Relative: 18 %
Lymphs Abs: 1.5 10*3/uL (ref 0.7–4.0)
MCH: 27.7 pg (ref 26.0–34.0)
MCHC: 29.5 g/dL — ABNORMAL LOW (ref 30.0–36.0)
MCV: 93.7 fL (ref 78.0–100.0)
MONO ABS: 0.6 10*3/uL (ref 0.1–1.0)
Monocytes Relative: 8 %
NEUTROS ABS: 5.9 10*3/uL (ref 1.7–7.7)
NEUTROS PCT: 72 %
Platelets: 405 10*3/uL — ABNORMAL HIGH (ref 150–400)
RBC: 2.71 MIL/uL — ABNORMAL LOW (ref 3.87–5.11)
RDW: 16.8 % — AB (ref 11.5–15.5)
WBC: 8.1 10*3/uL (ref 4.0–10.5)

## 2016-04-12 LAB — COMPREHENSIVE METABOLIC PANEL
ALBUMIN: 1.5 g/dL — AB (ref 3.5–5.0)
ALT: 10 U/L — ABNORMAL LOW (ref 14–54)
ANION GAP: 9 (ref 5–15)
AST: 24 U/L (ref 15–41)
Alkaline Phosphatase: 64 U/L (ref 38–126)
BUN: 20 mg/dL (ref 6–20)
CO2: 30 mmol/L (ref 22–32)
Calcium: 8 mg/dL — ABNORMAL LOW (ref 8.9–10.3)
Chloride: 97 mmol/L — ABNORMAL LOW (ref 101–111)
Creatinine, Ser: 1.01 mg/dL — ABNORMAL HIGH (ref 0.44–1.00)
GFR calc Af Amer: >60 mL/min (ref >60)
GFR calc non Af Amer: 58 mL/min — ABNORMAL LOW (ref >60)
GLUCOSE: 102 mg/dL — AB (ref 65–99)
POTASSIUM: 4 mmol/L (ref 3.5–5.1)
SODIUM: 136 mmol/L (ref 135–145)
Total Bilirubin: 0.3 mg/dL (ref 0.3–1.2)
Total Protein: 5.8 g/dL — ABNORMAL LOW (ref 6.5–8.1)

## 2016-04-12 LAB — MAGNESIUM: Magnesium: 1.9 mg/dL (ref 1.7–2.4)

## 2016-04-12 LAB — PHOSPHORUS: Phosphorus: 4 mg/dL (ref 2.5–4.6)

## 2016-04-12 LAB — RENAL FUNCTION PANEL
ALBUMIN: 1.5 g/dL — AB (ref 3.5–5.0)
ANION GAP: 9 (ref 5–15)
BUN: 20 mg/dL (ref 6–20)
CALCIUM: 7.9 mg/dL — AB (ref 8.9–10.3)
CO2: 30 mmol/L (ref 22–32)
Chloride: 96 mmol/L — ABNORMAL LOW (ref 101–111)
Creatinine, Ser: 0.91 mg/dL (ref 0.44–1.00)
Glucose, Bld: 104 mg/dL — ABNORMAL HIGH (ref 65–99)
PHOSPHORUS: 4 mg/dL (ref 2.5–4.6)
Potassium: 4 mmol/L (ref 3.5–5.1)
SODIUM: 135 mmol/L (ref 135–145)

## 2016-04-12 LAB — GLUCOSE, CAPILLARY
GLUCOSE-CAPILLARY: 77 mg/dL (ref 65–99)
Glucose-Capillary: 100 mg/dL — ABNORMAL HIGH (ref 65–99)
Glucose-Capillary: 103 mg/dL — ABNORMAL HIGH (ref 65–99)

## 2016-04-12 MED ORDER — TRACE MINERALS CR-CU-MN-SE-ZN 10-1000-500-60 MCG/ML IV SOLN
INTRAVENOUS | Status: AC
  Administered 2016-04-12: 17:00:00 via INTRAVENOUS
  Filled 2016-04-12: qty 1992

## 2016-04-12 MED ORDER — FAT EMULSION 20 % IV EMUL
240.0000 mL | INTRAVENOUS | Status: AC
  Administered 2016-04-12: 240 mL via INTRAVENOUS
  Filled 2016-04-12: qty 250

## 2016-04-12 NOTE — Progress Notes (Signed)
PULMONARY / CRITICAL CARE MEDICINE   Name: Christina Castaneda MRN: [id number] DOB: 09/28/1956    ADMISSION DATE:  03/28/2016 CONSULTATION DATE:  03/29/2016  REFERRING MD:  CCS  CHIEF COMPLAINT:  Colostomy bag leaking  60 yo female had diverticulitis with sigmoid colon resection, appendectomy at [place] [place] on 3/20 and required colostomy.Came home to [place] , [place]  Noted to have feculent material from midline wound and present to ER on 4/5  S/p laparotomy, colostomy removal and end stapling of stoma, partial colectomy, omentectomy, debridement of wound--Dr. Hulen Skains, subsequent ruq stoma and washout, subsequent abra placement by Dr Redmond Pulling  SUBJECTIVE:  Remains critically ill on  Low dose levo gtt  sedated deeply on lower doses versed/ fent gtt due to open abdomen Afebrile   VITAL SIGNS: BP 143/55 mmHg  Pulse 68  Temp(Src) 98.8 F (37.1 C) (Axillary)  Resp 20  Ht 5\' 6"  (1.676 m)  Wt 343 lb (155.584 kg)  BMI 55.39 kg/m2  SpO2 100%  LMP 11/18/2012  HEMODYNAMICS: CVP:  [9 mmHg-10 mmHg] 9 mmHg  VENTILATOR SETTINGS: Vent Mode:  [-] PRVC FiO2 (%):  [40 %] 40 % Set Rate:  [16 bmp] 16 bmp Vt Set:  [550 mL] 550 mL PEEP:  [5 cmH20] 5 cmH20 Plateau Pressure:  [17 cmH20-19 cmH20] 19 cmH20  INTAKE / OUTPUT: I/O last 3 completed shifts: In: 4711.9 [I.V.:1806.9] Out: [id number]; Drains:500]  PHYSICAL EXAMINATION: General: sedated, acutely ill Neuro: RASS -2 HEENT: trach site clean Cardiac: regular Chest: decreased BS BL Abd: wound vac, abra closure device Ext: 2+ edema Skin: no rashes  LABS:  BMET  Recent Labs Lab 04/11/16 0340 04/12/16 0430 04/12/16 0431  NA 136  136 136 135  K 4.0  4.0 4.0 4.0  CL 98*  98* 97* 96*  CO2 30  30 30 30   BUN 21*  21* 20 20  CREATININE 0.86  0.89 1.01* 0.91  GLUCOSE 110*  111* 102* 104*   Electrolytes  Recent Labs Lab 04/10/16 0359 04/11/16 0340 04/12/16 0430 04/12/16 0431  CALCIUM  --  7.9*  7.9* 8.0* 7.9*  MG 1.8 2.1  1.9  --   PHOS  --  3.9 4.0 4.0   CBC  Recent Labs Lab 04/10/16 0359 04/11/16 0340 04/12/16 0430  WBC 11.5* 8.9 8.1  HGB 8.4* 7.6* 7.5*  HCT 27.4* 24.8* 25.4*  PLT 446* 383 405*   Coag's No results for input(s): APTT, INR in the last 168 hours.  Sepsis Markers  Recent Labs Lab 04/05/16 1022 04/06/16 0444 04/07/16 0210  PROCALCITON 0.31 0.26 0.20   ABG  Recent Labs Lab 04/08/16 0423 04/08/16 0453 04/09/16 0433  PHART 7.309* 7.309* 7.385  PCO2ART 53.7* 54.9* 53.4*  PO2ART 169* 170.0* 93.0   Liver Enzymes  Recent Labs Lab 04/09/16 0225  04/11/16 0340 04/12/16 0430 04/12/16 0431  AST 18  --   --  24  --   ALT 11*  --   --  10*  --   ALKPHOS 60  --   --  64  --   BILITOT 0.2*  --   --  0.3  --   ALBUMIN 1.3*  < > 1.4* 1.5* 1.5*  < > = values in this interval not displayed.  Cardiac Enzymes No results for input(s): TROPONINI, PROBNP in the last 168 hours.  Glucose  Recent Labs Lab 04/10/16 1138 04/10/16 2310 04/11/16 0356 04/11/16 1229 04/11/16 1641 04/12/16 0356  GLUCAP 102* 107* 93 101* 95 100*  Imaging No results found.  STUDIES:    MICROBIOLOGY: Blood Ctx x1 4/9>>> Coag neg staph Urine Ctx 4/9>>> Coag neg staph Tracheal Asp Ctx 4/9>>> yeast Blood Ctx x2 4/18>>> negative Urine Ctx 4/5:  Multiple species present MRSA PCR 4/6:  Negative   ANTIBIOTICS: Cipro 4/5>>> off Flagyl 4/5>>>off Vanco 4/11 >>off  SIGNIFICANT EVENTS: 3/20 Exp lap appendectomy,removal sigmoid colon, bowel repair in setting diverticulitis. [facility] 4/6 Exp Lap , colostomy removal, partial colectomy,omentectom, wound debridement, open wound with wound vac. 4/10 Start TPN  LINES/TUBES: ETT 4/6>>> 4/13 R PICC 4/6>>> L RADIAL ART LINE 4/7>>>4/15 #6 cuffed Trach [employer]) 4/13 >> Rt femoral aline 4/15 >>  DISCUSSION: Maintaining deep sedation while open abdomen  ASSESSMENT / PLAN:  PULMONARY A: Acute resp failure , prolonged ventialtion  s/p tracheostomy. Hx  of COPD with asthma, OSA >> wheezing noted 4/14. Tobacco abuse. P:   Continue full vent support >> no weaning until  abd closed Brovana, pulmicort with prn albuterol  CARDIOVASCULAR A:  Septic shock. Hx of HTN. Hypervolemia >> great response to lasix 4/15. Echo - nml LV fn, nml RV P:  Levophed gtt  to keep MAP > 65 -attempt taper   RENAL A:   AKI >> resolved. Hypokalemia. Hypomagnesemia. P:   Monitor renal fx, urine outpt Replace electrolytes as needed  lasix to 20 q 12h for equal balance  GASTROINTESTINAL A:   Peritonitis s/p laparotomy. Trickle tube feeds as tolerated per CCS >> held 4/16 due to increase residual/ vomiting Protein calorie malnutrition  Morbid obesity P:   Post-op care, TPN per CCS,abra closure device being advanced last 4/19 Protonix for SUP Resume trickle TFs when ostomy output improves  HEMATOLOGIC A:   Anemia of critical illness. P:  F/u CBC Transfuse for Hb < 7 Lovenox for DVT prevention  INFECTIOUS A:   Septic shock 2nd to peritonitis and cellulitis of ostomy site. Coag neg Staph bacteremia, UTI. Hx of PCN allergy. P:   cipro, flagyl for peritonitis- stopped dc vancomycin for Coag neg staph x 7ds Rpt blood cx shows clearance   ENDOCRINE A:   Hx of hypothyroidism. P:   Synthroid IV -change to via tube soon  NEUROLOGIC A:   Acute metabolic encephalopathy. Bradycardia from diprivan. P:   RASS goal: -2 Versed gtt- minimise /fent gtt   The patient is critically ill with multiple organ systems failure and requires high complexity decision making for assessment and support, frequent evaluation and titration of therapies, application of advanced monitoring technologies and extensive interpretation of multiple databases. Critical Care Time devoted to patient care services described in this note independent of APP time is 31 minutes.   Kara Mead MD. [place] Flood. [facility] Pager [phone] If no response call [phone]    04/12/2016, 9:03 AM

## 2016-04-12 NOTE — Progress Notes (Signed)
CVP and Arterial line set up changed per protocol.

## 2016-04-12 NOTE — Progress Notes (Signed)
8 Days Post-Op  Subjective: On vent  Objective: Vital signs in last 24 hours: Temp:  [98.6 F (37 C)-99.2 F (37.3 C)] 98.8 F (37.1 C) (04/20 0400) Pulse Rate:  [50-79] 58 (04/20 0645) Resp:  [14-31] 18 (04/20 0645) BP: (130-139)/(56-75) 139/66 mmHg (04/20 0338) SpO2:  [98 %-100 %] 100 % (04/20 0645) Arterial Line BP: (81-199)/(38-82) 105/45 mmHg (04/20 0645) FiO2 (%):  [40 %] 40 % (04/20 0338) Weight:  [155.584 kg (343 lb)] 155.584 kg (343 lb) (04/20 0159) Last BM Date:  (unknown/PTA)  Intake/Output from previous day: 04/19 0701 - 04/20 0700 In: 3191.4 [I.V.:1199.4; TPN:1992] Out: 4550 [Urine:4250; Drains:300] Intake/Output this shift:    General appearance: no distress Neck: trach Resp: clear to auscultation bilaterally GI: soft, RUQ stoma pink, midline VAC  Lab Results:   Recent Labs  04/11/16 0340 04/12/16 0430  WBC 8.9 8.1  HGB 7.6* 7.5*  HCT 24.8* 25.4*  PLT 383 405*   BMET  Recent Labs  04/12/16 0430 04/12/16 0431  NA 136 135  K 4.0 4.0  CL 97* 96*  CO2 30 30  GLUCOSE 102* 104*  BUN 20 20  CREATININE 1.01* 0.91  CALCIUM 8.0* 7.9*   PT/INR No results for input(s): LABPROT, INR in the last 72 hours. ABG No results for input(s): PHART, HCO3 in the last 72 hours.  Invalid input(s): PCO2, PO2  Studies/Results: Dg Chest Port 1 View  04/11/2016  CLINICAL DATA:  Respiratory failure EXAM: PORTABLE CHEST 1 VIEW COMPARISON:  April 09, 2016 FINDINGS: Tracheostomy catheter tip is 5.4 cm above the carina. Feeding tube tip is below the diaphragm. Central catheter tip is in the superior vena cava. No pneumothorax. There is no edema or consolidation. Heart is borderline enlarged with pulmonary vascularity within normal limits. No adenopathy. IMPRESSION: Tube and catheter positions as described without pneumothorax. No edema or consolidation. Stable cardiac prominence. Electronically Signed   By: Lowella Grip III M.D.   On: 04/11/2016 07:38     Anti-infectives: Anti-infectives    Start     Dose/Rate Route Frequency Ordered Stop   04/03/16 2100  vancomycin (VANCOCIN) 1,500 mg in sodium chloride 0.9 % 500 mL IVPB  Status:  Discontinued     1,500 mg 250 mL/hr over 120 Minutes Intravenous Every 12 hours 04/02/16 2020 04/06/16 0943   04/02/16 2030  vancomycin (VANCOCIN) 2,500 mg in sodium chloride 0.9 % 500 mL IVPB     2,500 mg 250 mL/hr over 120 Minutes Intravenous  Once 04/02/16 2020 04/02/16 2306   03/29/16 1200  ciprofloxacin (CIPRO) IVPB 400 mg  Status:  Discontinued     400 mg 200 mL/hr over 60 Minutes Intravenous Every 12 hours 03/29/16 0213 04/06/16 0943   03/29/16 0600  [MAR Hold]  ciprofloxacin (CIPRO) IVPB 400 mg     (MAR Hold since 03/28/16 2357)   400 mg 200 mL/hr over 60 Minutes Intravenous On call to O.R. 03/28/16 2306 03/28/16 2355   03/29/16 0600  [MAR Hold]  metroNIDAZOLE (FLAGYL) IVPB 500 mg     (MAR Hold since 03/28/16 2357)   500 mg 100 mL/hr over 60 Minutes Intravenous On call to O.R. 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 Hulen Skains, subsequent ruq stoma and washout, subsequent abra placement by Dr Redmond Pulling - change vac tomorrow - hold TF until ostomy output increases as had emesis Hypotension-on levo Respiratory-full vent support, appreciate CCM assistance, s/p trach.  May wean with ABRA in place  VTE prophylaxis-SCD/lovenox Neuro- versed down to  3mg /h PCM-TPN started 4/6. Vomited, TF stopped  Dispo-ICU  LOS: 15 days    Ingeborg Fite E 04/12/2016

## 2016-04-12 NOTE — Progress Notes (Signed)
PARENTERAL NUTRITION CONSULT NOTE - FOLLOW UP  Pharmacy Consult for TPN Indication: Intolerance to EN (expected to prolong ileus)  Allergies  Allergen Reactions  . Penicillins Anaphylaxis, Hives, Shortness Of Breath, Swelling and Other (See Comments)    Angioedema    Patient Measurements: Height: '5\' 6"'  (167.6 cm) Weight: (!) 343 lb (155.584 kg) IBW/kg (Calculated) : 59.3 Adjusted Body Weight:  Usual Weight:   Vital Signs: Temp: 98.8 F (37.1 C) (04/20 0801) Temp Source: Axillary (04/20 0801) BP: 143/55 mmHg (04/20 0801) Pulse Rate: 68 (04/20 0801) Intake/Output from previous day: 04/19 0701 - 04/20 0700 In: 3320.1 [I.V.:1245.1; TPN:2075] Out: 1975 [Urine:4375; Drains:300] Intake/Output from this shift: Total I/O In: 247.6 [I.V.:81.6; TPN:166] Out: 100 [Urine:100]  Labs:  Recent Labs  04/10/16 0359 04/11/16 0340 04/12/16 0430  WBC 11.5* 8.9 8.1  HGB 8.4* 7.6* 7.5*  HCT 27.4* 24.8* 25.4*  PLT 446* 383 405*     Recent Labs  04/10/16 0359 04/11/16 0340 04/12/16 0430 04/12/16 0431  NA  --  136  136 136 135  K  --  4.0  4.0 4.0 4.0  CL  --  98*  98* 97* 96*  CO2  --  '30  30 30 30  ' GLUCOSE  --  110*  111* 102* 104*  BUN  --  21*  21* 20 20  CREATININE  --  0.86  0.89 1.01* 0.91  CALCIUM  --  7.9*  7.9* 8.0* 7.9*  MG 1.8 2.1 1.9  --   PHOS  --  3.9 4.0 4.0  PROT  --   --  5.8*  --   ALBUMIN  --  1.4* 1.5* 1.5*  AST  --   --  24  --   ALT  --   --  10*  --   ALKPHOS  --   --  64  --   BILITOT  --   --  0.3  --    Estimated Creatinine Clearance: 99 mL/min (by C-G formula based on Cr of 0.91).    Recent Labs  04/11/16 1229 04/11/16 1641 04/12/16 0356  GLUCAP 101* 95 100*    Medications:  Scheduled:  . antiseptic oral rinse  7 mL Mouth Rinse 10 times per day  . arformoterol  15 mcg Nebulization BID  . budesonide (PULMICORT) nebulizer solution  0.25 mg Nebulization BID  . chlorhexidine gluconate (SAGE KIT)  15 mL Mouth Rinse BID  .  enoxaparin (LOVENOX) injection  40 mg Subcutaneous Q24H  . feeding supplement (PRO-STAT SUGAR FREE 64)  60 mL Per Tube BID  . feeding supplement (VITAL HIGH PROTEIN)  1,000 mL Per Tube Q24H  . furosemide  20 mg Intravenous BID  . levothyroxine  100 mcg Intravenous Daily  . pantoprazole (PROTONIX) IV  40 mg Intravenous QHS  . sodium chloride flush  10-40 mL Intracatheter Q12H    Insulin Requirements in the past 24 hours:  SSI d/c'd  Admission: 60 YOF who was 2 weeks out from emergency colectomy/colostomy and discharged on 03/25/16. Noted to have feculent discharge from midline wound when having staples removed and diagnosed with necrotic stoma, peritonitis, and fascial necrosis. Pharmacy consulted to start TPN due to expected prolonged ileus.   Assessment: 4-7 Patient in the surgical ICU post-op on 4/6. No muscle or subcutaneous fat depletion noticed per RD assessment. 4-9 Triglycerides 48 on propofol this AM 4-13 TG 49 on 4/12; propofol resumed post-operatively 4/12 and running at 40 mcg/kg/min.   Surgeries/Procedures: ~2 wks  pta: emergency colectomy/colostomy (Hartman's) 4/6: colostomy removal and end stapling of stoma, partial colectomy, ex-lap, omentectomy, wound debridement 4/7: lap, VAC change, possible debridement 4/10: Re-exploration of abdomen, Wound VAC change 4/12: Re-exploration of abdomen, placement of abdominal wall closure device 4/13: Trach placed  4/14, 4/17: Wound VAC change  GI: prealbumin 3.2 > 13.  Drain with 348m out. TF held 4/16 d/t bilious liquid coming out from mouth/nose.Holding TF until ostomy output increases. VAC change 4/21.  PPI-IV  Endo: hypothyroid on IV Synthroid. Gout. No hx DM - SSI stopped, serum gluc 104. Lytes: no lytes in TPN on 4/14 and 4/15 - K 4 (goal >/= 4 for ileus), Mag 1.9. Phos 4, Ca 7.9 (CoCa 9.98) Renal: SCr stable, normalized CrCL 73 ml/min - UOP 1.1 ml/kg/hr (Lasix x2), NS at 10 ml/hr. Furosemide 257mIV qday Pulm: COPD /  asthma. Vent post-op, trached 4/13, FiO2 40%, no wean until abd closed. Brovana, Pulmicort Cards: HTN - BP soft on Levophed, brady, CVP 10 Hepatobil: LFTs / tbili / TG WNL. No lipid with TPN since initiation, only through Propofol which was d/c'ed on 4/13. Neuro: Fentanyl/Versed gtts- weaning, sedation d/t open abd - GCS 9, CPOT 0-1, RASS at goal -2 ID: s/p 10d of Cipro/Flagyl for intra-abd infxn. CoNS in urine and blood (1 of 2). Afebrile, WBC wnl this AM. New blood cx drawn 4/18, pending.   Best Practices: Lovenox, PPI IV, MC TPN Access: PICC 03/29/16 TPN start date: 03/29/16  Current Nutrition:  Vital HP - held Prostat 6019mID (30g and 200 kCal per serving) - held  Nutritional Goals: [id number] and 145-155 gm protein per day    Plan:  - Continue Clinimix E 5/15 at 83 ml/hr + 20% ILE at 10 ml/hr only on Mon and Thurs to prevent EFAD. TPN will provide a weekly average of 1551 kCal and 100gm of protein daily. TPN will exceed kCal need by 5% and meet 70% of protein need. Unable to meet patient's needs d/t limitations of Clinimix. - Daily multivitamin and trace elements in TPN - TPN labs Mon/Thurs - F/U with possibility of restarting TF, labs in AM   KenGracy BruinsharmD Clinical Pharmacist [facility]

## 2016-04-12 NOTE — Progress Notes (Signed)
"  the move" performed.

## 2016-04-12 NOTE — Progress Notes (Signed)
Pt is sleeping comfortably at this time no distress noted.  

## 2016-04-12 NOTE — Op Note (Signed)
Preoperative diagnosis: open abdomen  Postoperative diagnosis: open abdomen   Procedure: myofascial advancement, negative pressure dressing of 50-100cm^2  Surgeon: Gurney Maxin, M.D.  Asst: Jomarie Longs  Anesthesia: in ICU with fentanyl and versed  Indications for procedure: Christina Castaneda is a 60 y.o. year old female with symptoms of dead colostomy, s/p resection with complication of open abdomen.  Description of procedure: WHO checklist was applied. The patient was prepped and draped in the usual sterile fashion. The previous vac was taken down. The fashion was measured at 5.5cm in the middle with the lower area of 10cm. The myofascial maneuver was performed. The bands were tightened to 2cm  In the lower abdomen and 1.5cm around the ostomy. The myofascial maneuver was performed again. The fascial defect was 5cm in the middle with 9cm at the lower area. The vac was replaced in standard fashion with good suction  Findings: 5cm defect  Specimen: non  Implant: wound vac  Blood loss: <53ml  Local anesthesia:   Complications: none  Gurney Maxin, M.D. General, Bariatric, & Minimally Invasive Surgery [facility]

## 2016-04-13 LAB — CBC WITH DIFFERENTIAL/PLATELET
Basophils Absolute: 0 10*3/uL (ref 0.0–0.1)
Basophils Relative: 0 %
EOS PCT: 1 %
Eosinophils Absolute: 0.1 10*3/uL (ref 0.0–0.7)
HCT: 24.6 % — ABNORMAL LOW (ref 36.0–46.0)
Hemoglobin: 7.7 g/dL — ABNORMAL LOW (ref 12.0–15.0)
LYMPHS ABS: 1.3 10*3/uL (ref 0.7–4.0)
LYMPHS PCT: 15 %
MCH: 29.3 pg (ref 26.0–34.0)
MCHC: 31.3 g/dL (ref 30.0–36.0)
MCV: 93.5 fL (ref 78.0–100.0)
MONO ABS: 0.7 10*3/uL (ref 0.1–1.0)
MONOS PCT: 8 %
Neutro Abs: 6.6 10*3/uL (ref 1.7–7.7)
Neutrophils Relative %: 76 %
PLATELETS: 422 10*3/uL — AB (ref 150–400)
RBC: 2.63 MIL/uL — ABNORMAL LOW (ref 3.87–5.11)
RDW: 17.1 % — AB (ref 11.5–15.5)
WBC: 8.8 10*3/uL (ref 4.0–10.5)

## 2016-04-13 LAB — RENAL FUNCTION PANEL
ALBUMIN: 1.4 g/dL — AB (ref 3.5–5.0)
Anion gap: 8 (ref 5–15)
BUN: 21 mg/dL — AB (ref 6–20)
CO2: 31 mmol/L (ref 22–32)
Calcium: 8 mg/dL — ABNORMAL LOW (ref 8.9–10.3)
Chloride: 95 mmol/L — ABNORMAL LOW (ref 101–111)
Creatinine, Ser: 0.87 mg/dL (ref 0.44–1.00)
GFR calc Af Amer: >60 mL/min (ref >60)
GFR calc non Af Amer: >60 mL/min (ref >60)
GLUCOSE: 110 mg/dL — AB (ref 65–99)
PHOSPHORUS: 3.8 mg/dL (ref 2.5–4.6)
POTASSIUM: 4 mmol/L (ref 3.5–5.1)
Sodium: 134 mmol/L — ABNORMAL LOW (ref 135–145)

## 2016-04-13 LAB — GLUCOSE, CAPILLARY
Glucose-Capillary: 105 mg/dL — ABNORMAL HIGH (ref 65–99)
Glucose-Capillary: 86 mg/dL (ref 65–99)

## 2016-04-13 MED ORDER — TRACE MINERALS CR-CU-MN-SE-ZN 10-1000-500-60 MCG/ML IV SOLN
INTRAVENOUS | Status: AC
  Administered 2016-04-13: 17:00:00 via INTRAVENOUS
  Filled 2016-04-13: qty 1680

## 2016-04-13 MED ORDER — LEVOTHYROXINE SODIUM 100 MCG PO TABS
200.0000 ug | ORAL_TABLET | Freq: Every day | ORAL | Status: DC
  Administered 2016-04-14: 200 ug via ORAL
  Filled 2016-04-13: qty 2

## 2016-04-13 MED ORDER — MIDAZOLAM HCL 2 MG/2ML IJ SOLN
1.0000 mg | INTRAMUSCULAR | Status: DC | PRN
  Administered 2016-04-14 – 2016-04-15 (×3): 2 mg via INTRAVENOUS
  Filled 2016-04-13 (×4): qty 2

## 2016-04-13 MED ORDER — FAT EMULSION 20 % IV EMUL
240.0000 mL | INTRAVENOUS | Status: AC
  Administered 2016-04-13: 240 mL via INTRAVENOUS
  Filled 2016-04-13: qty 250

## 2016-04-13 NOTE — Progress Notes (Addendum)
30 mg midazolam wasted and witnessed by Sula Soda RN, in sink [place], [place] 6:07 PM Witnessed [place], [place]

## 2016-04-13 NOTE — Progress Notes (Signed)
PARENTERAL NUTRITION CONSULT NOTE - FOLLOW UP  Pharmacy Consult for TPN Indication: Intolerance to EN (expected to prolong ileus)  Allergies  Allergen Reactions  . Penicillins Anaphylaxis, Hives, Shortness Of Breath, Swelling and Other (See Comments)    Angioedema    Patient Measurements: Height: '5\' 6"'  (167.6 cm) Weight: (!) 337 lb (152.862 kg) IBW/kg (Calculated) : 59.3 Adjusted Body Weight:  Usual Weight:   Vital Signs: Temp: 99 F (37.2 C) (04/21 0400) Temp Source: Oral (04/21 0400) BP: 138/63 mmHg (04/21 0319) Pulse Rate: 55 (04/21 0645) Intake/Output from previous day: 04/20 0701 - 04/21 0700 In: 3405.9 [I.V.:973.9; NG/GT:300; JTT:0177] Out: 3375 [Urine:2725; Drains:250; Stool:400] Intake/Output from this shift:    Labs:  Recent Labs  04/11/16 0340 04/12/16 0430 04/13/16 0500  WBC 8.9 8.1 8.8  HGB 7.6* 7.5* 7.7*  HCT 24.8* 25.4* 24.6*  PLT 383 405* 422*     Recent Labs  04/11/16 0340 04/12/16 0430 04/12/16 0431 04/13/16 0500  NA 136  136 136 135 134*  K 4.0  4.0 4.0 4.0 4.0  CL 98*  98* 97* 96* 95*  CO2 '30  30 30 30 31  ' GLUCOSE 110*  111* 102* 104* 110*  BUN 21*  21* 20 20 21*  CREATININE 0.86  0.89 1.01* 0.91 0.87  CALCIUM 7.9*  7.9* 8.0* 7.9* 8.0*  MG 2.1 1.9  --   --   PHOS 3.9 4.0 4.0 3.8  PROT  --  5.8*  --   --   ALBUMIN 1.4* 1.5* 1.5* 1.4*  AST  --  24  --   --   ALT  --  10*  --   --   ALKPHOS  --  64  --   --   BILITOT  --  0.3  --   --    Estimated Creatinine Clearance: 102.4 mL/min (by C-G formula based on Cr of 0.87).    Recent Labs  04/12/16 0356 04/12/16 1140 04/12/16 2240  GLUCAP 100* 77 103*    Medications:  Scheduled:  . antiseptic oral rinse  7 mL Mouth Rinse 10 times per day  . arformoterol  15 mcg Nebulization BID  . budesonide (PULMICORT) nebulizer solution  0.25 mg Nebulization BID  . chlorhexidine gluconate (SAGE KIT)  15 mL Mouth Rinse BID  . enoxaparin (LOVENOX) injection  40 mg Subcutaneous  Q24H  . feeding supplement (PRO-STAT SUGAR FREE 64)  60 mL Per Tube BID  . feeding supplement (VITAL HIGH PROTEIN)  1,000 mL Per Tube Q24H  . furosemide  20 mg Intravenous BID  . levothyroxine  100 mcg Intravenous Daily  . pantoprazole (PROTONIX) IV  40 mg Intravenous QHS  . sodium chloride flush  10-40 mL Intracatheter Q12H    Insulin Requirements in the past 24 hours:  SSI d/c'd  Admission: 60 YOF who was 2 weeks out from emergency colectomy/colostomy and discharged on 03/25/16. Noted to have feculent discharge from midline wound when having staples removed and diagnosed with necrotic stoma, peritonitis, and fascial necrosis. Pharmacy consulted to start TPN due to expected prolonged ileus.   Surgeries/Procedures: ~2 wks pta: emergency colectomy/colostomy (Hartman's) 4/6: colostomy removal and end stapling of stoma, partial colectomy, ex-lap, omentectomy, wound debridement 4/7: lap, VAC change, possible debridement 4/10: Re-exploration of abdomen, Wound VAC change 4/12: Re-exploration of abdomen, placement of abdominal wall closure device 4/13: Trach placed  4/14, 4/17: Wound VAC change  GI: prealbumin 3.2 > 13.  Drain with 249m out. TF held 4/16 d/t bilious  liquid coming out from mouth/nose.Resuming trickle feeds today since ostomy output improving. VAC change 4/21.  PPI-IV  Endo: hypothyroid on IV Synthroid. Gout. No hx DM - SSI stopped, serum gluc 104. Lytes: K 4 (goal >/= 4 for ileus), Mag 1.9. Phos 3.8, Ca 8 (CoCa 9.76) Renal: SCr stable, normalized CrCL 71 ml/min - UOP 0.8 ml/kg/hr (Lasix x2), NS at 10 ml/hr. Furosemide 54m IV qday Pulm: COPD / asthma. Vent post-op, trached 4/13, FiO2 40%, no wean until abd closed. Brovana, Pulmicort Cards: HTN - BP soft on Levophed, brady Hepatobil: LFTs / tbili / TG WNL. No lipid with TPN since initiation, only through Propofol which was d/c'ed on 4/13. Neuro: Fentanyl/Versed gtts- weaning, sedation d/t open abd - GCS 12, CPOT 0, RASS  at goal -2 ID: s/p 10d of Cipro/Flagyl for intra-abd infxn. CoNS in urine and blood (1 of 2). Afebrile, WBC wnl this AM. New blood cx drawn 4/18, pending.   Best Practices: Lovenox, PPI IV, MC TPN Access: PICC 03/29/16 TPN start date: 03/29/16  Current Nutrition:  Resuming tricking feeds today  Prostat 639mBID (30g and 200 kCal per serving) - held   Nutritional Goals: 134287-6811Cal and 145-155 gm protein per day    Plan:  - Decrease Clinimix E 5/15 to 70 ml/hr + 20% ILE at 10 ml/hr only on Mon/Wed/Fri to prevent EFAD. TPN plus trickle feeds will provide goal calories. Unable to meet patient's protein needs d/t limitations of Clinimix. - Daily multivitamin and trace elements in TPN - TPN labs Mon/Thurs - Monitor tolerance to trickle feeds and plans for diet advancement tomorrow    BeAlbertina ParrPharmD., BCPS Clinical Pharmacist Pager [phone]

## 2016-04-13 NOTE — Progress Notes (Signed)
RT note- attempted to wean, increase in blood pressure, RN aware and medicaton given. Sutures removed per Dr. Elsworth Soho.

## 2016-04-13 NOTE — Care Management Note (Signed)
Case Management Note  Patient Details  Name: Christina Castaneda MRN: [id number] Date of Birth: [birth year]  Subjective/Objective:     S/p Procedure:completion left colectomy with mobilization splenic flexure, end colostomy, placement of abdominal vac sponge               Action/Plan:  PTA - pt is independent from home alone with husband, recent surgery 3/20 [facility]man's with colostomy at [facility].  Pt may benefit from HH/DME at discharge; CM will continue to monitor for disposition needs   Expected Discharge Date:                  Expected Discharge Plan:  [place]  In-House Referral:     Discharge planning Services  CM Consult  Post Acute Care Choice:    Choice offered to:     DME Arranged:    DME Agency:     HH Arranged:    [place] Agency:     Status of Service:  In process, will continue to follow  Medicare Important Message Given:    Date Medicare IM Given:    Medicare IM give by:    Date Additional Medicare IM Given:    Additional Medicare Important Message give by:     If discussed at [place] of Stay Meetings, dates discussed:    Additional Comments: 04/13/2016 CM spoke with husband in great detail regarding discharge planning.  Husband is very positive regarding pts recovery.  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  Pt remains vented with an open wound.  CM will continue to follow for disposition need Maryclare Labrador, RN 04/13/2016, 1:32 PM

## 2016-04-13 NOTE — Progress Notes (Signed)
Progress Note: General Surgery Service   Subjective: Weaned versed, fentanyl and levophed. +ostomy output  Objective: Vital signs in last 24 hours: Temp:  [98.7 F (37.1 C)-99 F (37.2 C)] 99 F (37.2 C) (04/21 0400) Pulse Rate:  [50-87] 55 (04/21 0645) Resp:  [17-26] 18 (04/21 0700) BP: (111-139)/(51-65) 138/63 mmHg (04/21 0319) SpO2:  [95 %-100 %] 100 % (04/21 0645) Arterial Line BP: (110-211)/(48-104) 124/48 mmHg (04/21 0700) FiO2 (%):  [40 %] 40 % (04/21 0400) Weight:  [152.862 kg (337 lb)] 152.862 kg (337 lb) (04/21 0400) Last BM Date: 04/12/16 (RUQ ostomy putting out stool)  Intake/Output from previous day: 04/20 0701 - 04/21 0700 In: 3405.9 [I.V.:973.9; NG/GT:300; SHF:0263] Out: 3375 [Urine:2725; Drains:250; Stool:400] Intake/Output this shift:    Lungs: coarse b/l, assisted ventilation  Cardiovascular: bradycardic  Abd: ABRA in place, ostomy pink with liquid stool  Extremities: +edema  Neuro: intubated sedated, GCS 6t  Lab Results: CBC   Recent Labs  04/12/16 0430 04/13/16 0500  WBC 8.1 8.8  HGB 7.5* 7.7*  HCT 25.4* 24.6*  PLT 405* 422*   BMET  Recent Labs  04/12/16 0431 04/13/16 0500  NA 135 134*  K 4.0 4.0  CL 96* 95*  CO2 30 31  GLUCOSE 104* 110*  BUN 20 21*  CREATININE 0.91 0.87  CALCIUM 7.9* 8.0*   PT/INR No results for input(s): LABPROT, INR in the last 72 hours. ABG No results for input(s): PHART, HCO3 in the last 72 hours.  Invalid input(s): PCO2, PO2  Studies/Results:  Anti-infectives: Anti-infectives    Start     Dose/Rate Route Frequency Ordered Stop   04/03/16 2100  vancomycin (VANCOCIN) 1,500 mg in sodium chloride 0.9 % 500 mL IVPB  Status:  Discontinued     1,500 mg 250 mL/hr over 120 Minutes Intravenous Every 12 hours 04/02/16 2020 04/06/16 0943   04/02/16 2030  vancomycin (VANCOCIN) 2,500 mg in sodium chloride 0.9 % 500 mL IVPB     2,500 mg 250 mL/hr over 120 Minutes Intravenous  Once 04/02/16 2020 04/02/16  2306   03/29/16 1200  ciprofloxacin (CIPRO) IVPB 400 mg  Status:  Discontinued     400 mg 200 mL/hr over 60 Minutes Intravenous Every 12 hours 03/29/16 0213 04/06/16 0943   03/29/16 0600  [MAR Hold]  ciprofloxacin (CIPRO) IVPB 400 mg     (MAR Hold since 03/28/16 2357)   400 mg 200 mL/hr over 60 Minutes Intravenous On call to O.R. 03/28/16 2306 03/28/16 2355   03/29/16 0600  [MAR Hold]  metroNIDAZOLE (FLAGYL) IVPB 500 mg     (MAR Hold since 03/28/16 2357)   500 mg 100 mL/hr over 60 Minutes Intravenous On call to O.R. 03/28/16 2306 03/29/16 0025   03/29/16 0600  metroNIDAZOLE (FLAGYL) IVPB 500 mg  Status:  Discontinued     500 mg 100 mL/hr over 60 Minutes Intravenous Every 8 hours 03/29/16 0213 04/06/16 0943   03/28/16 2300  ertapenem (INVANZ) 1 g in sodium chloride 0.9 % 50 mL IVPB  Status:  Discontinued     1 g 100 mL/hr over 30 Minutes Intravenous  Once 03/28/16 2259 03/28/16 2306      Medications: Scheduled Meds: . antiseptic oral rinse  7 mL Mouth Rinse 10 times per day  . arformoterol  15 mcg Nebulization BID  . budesonide (PULMICORT) nebulizer solution  0.25 mg Nebulization BID  . chlorhexidine gluconate (SAGE KIT)  15 mL Mouth Rinse BID  . enoxaparin (LOVENOX) injection  40 mg Subcutaneous  Q24H  . feeding supplement (PRO-STAT SUGAR FREE 64)  60 mL Per Tube BID  . feeding supplement (VITAL HIGH PROTEIN)  1,000 mL Per Tube Q24H  . furosemide  20 mg Intravenous BID  . levothyroxine  100 mcg Intravenous Daily  . pantoprazole (PROTONIX) IV  40 mg Intravenous QHS  . sodium chloride flush  10-40 mL Intracatheter Q12H   Continuous Infusions: . sodium chloride 10 mL/hr at 04/13/16 0700  . Marland KitchenTPN (CLINIMIX-E) Adult 83 mL/hr at 04/13/16 0700   And  . fat emulsion 240 mL (04/13/16 0700)  . fentaNYL infusion INTRAVENOUS 250 mcg/hr (04/13/16 0734)  . midazolam (VERSED) infusion 1 mg/hr (04/13/16 0700)  . norepinephrine (LEVOPHED) Adult infusion 3 mcg/min (04/13/16 0700)   PRN  Meds:.acetaminophen (TYLENOL) oral liquid 160 mg/5 mL, albuterol, fentaNYL, iopamidol, midazolam, [DISCONTINUED] ondansetron **OR** ondansetron (ZOFRAN) IV, sodium chloride flush  Assessment/Plan: Patient Active Problem List   Diagnosis Date Noted  . Acute respiratory failure ([place])   . Pressure ulcer 04/01/2016  . 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 Hypothyroid 03/29/2016  . Morbid obesity ([place]) 03/29/2016  . Open wound of abdomen 03/29/2016  . 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 Obesity 11/12/2012   s/p Procedure(s): EXPLORATORY LAPAROTOMY, PLACEMENT OF ABRA ABDOMINAL WALL CLOSURE SET  04/04/2016 -plan for vac change today -ok to restart tube feeds -continue wean   LOS: 16 days   Mickeal Skinner, MD Pg# [phone] [facility]

## 2016-04-13 NOTE — Progress Notes (Signed)
PULMONARY / CRITICAL CARE MEDICINE   Name: Christina Castaneda MRN: [id number] DOB: 05-21-1956    ADMISSION DATE:  03/28/2016 CONSULTATION DATE:  03/29/2016  REFERRING MD:  CCS  CHIEF COMPLAINT:  Colostomy bag leaking  60 yo female had diverticulitis with sigmoid colon resection, appendectomy at [place], [place] on 3/20 and required colostomy.Came home to [place] , [place]  Noted to have feculent material from midline wound and present to ER on 4/5  S/p laparotomy, colostomy removal and end stapling of stoma, partial colectomy, omentectomy, debridement of wound--Dr. Hulen Skains, subsequent ruq stoma and washout, subsequent abra placement by Dr Redmond Pulling  SUBJECTIVE:  Remains critically ill , intubated on  Low dose levo gtt   on lower doses versed/ fent gtt  Afebrile   VITAL SIGNS: BP 138/63 mmHg  Pulse 55  Temp(Src) 99 F (37.2 C) (Oral)  Resp 18  Ht 5\' 6"  (1.676 m)  Wt 337 lb (152.862 kg)  BMI 54.42 kg/m2  SpO2 100%  LMP 11/18/2012  HEMODYNAMICS: CVP:  [6 mmHg-10 mmHg] 10 mmHg  VENTILATOR SETTINGS: Vent Mode:  [-] PRVC FiO2 (%):  [40 %] 40 % Set Rate:  [16 bmp] 16 bmp Vt Set:  [550 mL] 550 mL PEEP:  [5 cmH20] 5 cmH20 Plateau Pressure:  [13 cmH20-18 cmH20] 18 cmH20  INTAKE / OUTPUT: I/O last 3 completed shifts: In: 4995.5 [I.V.:1567.5; NG/GT:300] Out: 6355 [Urine:5505; Drains:450; Stool:400]  PHYSICAL EXAMINATION: General: sedated, acutely ill Neuro: RASS -2 HEENT: trach site clean Cardiac: regular Chest: decreased BS BL Abd: wound vac, abra closure device Ext: 2+ edema Skin: no rashes  LABS:  BMET  Recent Labs Lab 04/12/16 0430 04/12/16 0431 04/13/16 0500  NA 136 135 134*  K 4.0 4.0 4.0  CL 97* 96* 95*  CO2 30 30 31   BUN 20 20 21*  CREATININE 1.01* 0.91 0.87  GLUCOSE 102* 104* 110*   Electrolytes  Recent Labs Lab 04/10/16 0359 04/11/16 0340 04/12/16 0430 04/12/16 0431 04/13/16 0500  CALCIUM  --  7.9*  7.9* 8.0* 7.9* 8.0*  MG 1.8 2.1 1.9  --   --   PHOS  --   3.9 4.0 4.0 3.8   CBC  Recent Labs Lab 04/11/16 0340 04/12/16 0430 04/13/16 0500  WBC 8.9 8.1 8.8  HGB 7.6* 7.5* 7.7*  HCT 24.8* 25.4* 24.6*  PLT 383 405* 422*   Coag's No results for input(s): APTT, INR in the last 168 hours.  Sepsis Markers  Recent Labs Lab 04/07/16 0210  PROCALCITON 0.20   ABG  Recent Labs Lab 04/08/16 0423 04/08/16 0453 04/09/16 0433  PHART 7.309* 7.309* 7.385  PCO2ART 53.7* 54.9* 53.4*  PO2ART 169* 170.0* 93.0   Liver Enzymes  Recent Labs Lab 04/09/16 0225  04/12/16 0430 04/12/16 0431 04/13/16 0500  AST 18  --  24  --   --   ALT 11*  --  10*  --   --   ALKPHOS 60  --  64  --   --   BILITOT 0.2*  --  0.3  --   --   ALBUMIN 1.3*  < > 1.5* 1.5* 1.4*  < > = values in this interval not displayed.  Cardiac Enzymes No results for input(s): TROPONINI, PROBNP in the last 168 hours.  Glucose  Recent Labs Lab 04/11/16 0356 04/11/16 1229 04/11/16 1641 04/12/16 0356 04/12/16 1140 04/12/16 2240  GLUCAP 93 101* 95 100* 77 103*   Imaging No results found.  STUDIES:    MICROBIOLOGY: Blood Ctx  x1 4/9>>> Coag neg staph Urine Ctx 4/9>>> Coag neg staph Tracheal Asp Ctx 4/9>>> yeast Blood Ctx x2 4/18>>> negative Urine Ctx 4/5:  Multiple species present MRSA PCR 4/6:  Negative   ANTIBIOTICS: Cipro 4/5>>> off Flagyl 4/5>>>off Vanco 4/11 >>off  SIGNIFICANT EVENTS: 3/20 Exp lap appendectomy,removal sigmoid colon, bowel repair in setting diverticulitis. [facility] 4/6 Exp Lap , colostomy removal, partial colectomy,omentectom, wound debridement, open wound with wound vac. 4/10 Start TPN  LINES/TUBES: ETT 4/6>>> 4/13 R PICC 4/6>>> L RADIAL ART LINE 4/7>>>4/15 #6 cuffed Trach Hyman Bible) 4/13 >> Rt femoral aline 4/15 >>  DISCUSSION: Can lighten sedation  with open abdomen  ASSESSMENT / PLAN:  PULMONARY A: Acute resp failure , prolonged ventialtion  s/p tracheostomy. Hx of COPD with asthma, OSA >> wheezing noted 4/14. Tobacco  abuse. P:   Start SBTs with goal ATC daytime Brovana, pulmicort with prn albuterol  CARDIOVASCULAR A:  Septic shock. Hx of HTN. Hypervolemia >> great response to lasix 4/15. Echo - nml LV fn, nml RV P:  Levophed gtt  to keep MAP > 65 -attempt taper   RENAL A:   AKI >> resolved. Hypokalemia. Hypomagnesemia. P:   Monitor renal fx, urine outpt Replace electrolytes as needed  lasix to 20 q 12h for equal balance  GASTROINTESTINAL A:   Peritonitis s/p laparotomy. Trickle tube feeds as tolerated per CCS >> held 4/16 due to increase residual/ vomiting Protein calorie malnutrition  Morbid obesity P:   Post-op care, TPN per CCS,abra closure device being advanced last 4/19 Protonix for SUP Resume trickle TFs now that ostomy output improving  HEMATOLOGIC A:   Anemia of critical illness. P:  F/u CBC Transfuse for Hb < 7 Lovenox for DVT prevention  INFECTIOUS A:   Septic shock 2nd to peritonitis and cellulitis of ostomy site. Coag neg Staph bacteremia, UTI. Hx of PCN allergy. P:   cipro, flagyl for peritonitis- stopped dc vancomycin for Coag neg staph x 7ds Rpt blood cx shows clearance   ENDOCRINE A:   Hx of hypothyroidism. P:   Synthroid IV -change to via tube   NEUROLOGIC A:   Acute metabolic encephalopathy. Bradycardia from diprivan. P:   RASS goal: -1 Dc Versed gtt- change to prn Ct fent gtt   The patient is critically ill with multiple organ systems failure and requires high complexity decision making for assessment and support, frequent evaluation and titration of therapies, application of advanced monitoring technologies and extensive interpretation of multiple databases. Critical Care Time devoted to patient care services described in this note independent of APP time is 31 minutes.   Kara Mead MD. [place] Flood. [place] [facility] Pager [phone] If no response call [phone]    04/13/2016, 11:18 AM

## 2016-04-14 LAB — POCT I-STAT 3, ART BLOOD GAS (G3+)
Acid-Base Excess: 11 mmol/L — ABNORMAL HIGH (ref 0.0–2.0)
Bicarbonate: 35.7 mEq/L — ABNORMAL HIGH (ref 20.0–24.0)
O2 Saturation: 97 %
PCO2 ART: 47.8 mmHg — AB (ref 35.0–45.0)
PH ART: 7.48 — AB (ref 7.350–7.450)
TCO2: 37 mmol/L (ref 0–100)
pO2, Arterial: 89 mmHg (ref 80.0–100.0)

## 2016-04-14 LAB — CBC WITH DIFFERENTIAL/PLATELET
BASOS ABS: 0 10*3/uL (ref 0.0–0.1)
BASOS PCT: 0 %
EOS ABS: 0.1 10*3/uL (ref 0.0–0.7)
Eosinophils Relative: 1 %
HEMATOCRIT: 24.5 % — AB (ref 36.0–46.0)
HEMOGLOBIN: 7.6 g/dL — AB (ref 12.0–15.0)
Lymphocytes Relative: 15 %
Lymphs Abs: 1.6 10*3/uL (ref 0.7–4.0)
MCH: 29.1 pg (ref 26.0–34.0)
MCHC: 31 g/dL (ref 30.0–36.0)
MCV: 93.9 fL (ref 78.0–100.0)
Monocytes Absolute: 0.7 10*3/uL (ref 0.1–1.0)
Monocytes Relative: 6 %
NEUTROS ABS: 8.1 10*3/uL — AB (ref 1.7–7.7)
NEUTROS PCT: 78 %
Platelets: 447 10*3/uL — ABNORMAL HIGH (ref 150–400)
RBC: 2.61 MIL/uL — AB (ref 3.87–5.11)
RDW: 17.1 % — ABNORMAL HIGH (ref 11.5–15.5)
WBC: 10.4 10*3/uL (ref 4.0–10.5)

## 2016-04-14 LAB — GLUCOSE, CAPILLARY
GLUCOSE-CAPILLARY: 134 mg/dL — AB (ref 65–99)
GLUCOSE-CAPILLARY: 112 mg/dL — AB (ref 65–99)
Glucose-Capillary: 115 mg/dL — ABNORMAL HIGH (ref 65–99)

## 2016-04-14 LAB — RENAL FUNCTION PANEL
ALBUMIN: 1.5 g/dL — AB (ref 3.5–5.0)
ANION GAP: 10 (ref 5–15)
BUN: 22 mg/dL — ABNORMAL HIGH (ref 6–20)
CO2: 30 mmol/L (ref 22–32)
Calcium: 8.1 mg/dL — ABNORMAL LOW (ref 8.9–10.3)
Chloride: 92 mmol/L — ABNORMAL LOW (ref 101–111)
Creatinine, Ser: 0.85 mg/dL (ref 0.44–1.00)
Glucose, Bld: 123 mg/dL — ABNORMAL HIGH (ref 65–99)
Phosphorus: 3.8 mg/dL (ref 2.5–4.6)
Potassium: 3.9 mmol/L (ref 3.5–5.1)
Sodium: 132 mmol/L — ABNORMAL LOW (ref 135–145)

## 2016-04-14 LAB — MAGNESIUM: Magnesium: 2 mg/dL (ref 1.7–2.4)

## 2016-04-14 MED ORDER — TRACE MINERALS CR-CU-MN-SE-ZN 10-1000-500-60 MCG/ML IV SOLN
INTRAVENOUS | Status: AC
  Administered 2016-04-14: 17:00:00 via INTRAVENOUS
  Filled 2016-04-14: qty 960

## 2016-04-14 NOTE — Progress Notes (Signed)
PARENTERAL NUTRITION CONSULT NOTE - FOLLOW UP  Pharmacy Consult for TPN Indication: Intolerance to EN (expected to prolong ileus)  Allergies  Allergen Reactions  . Penicillins Anaphylaxis, Hives, Shortness Of Breath, Swelling and Other (See Comments)    Angioedema    Patient Measurements: Height: '5\' 6"'  (167.6 cm) Weight: (!) 338 lb (153.316 kg) IBW/kg (Calculated) : 59.3 Adjusted Body Weight:  Usual Weight:   Vital Signs: Temp: 98.2 F (36.8 C) (04/22 0338) Temp Source: Oral (04/22 0338) BP: 94/42 mmHg (04/22 0600) Pulse Rate: 59 (04/22 0600) Intake/Output from previous day: 04/21 0701 - 04/22 0700 In: 3585.9 [I.V.:965.2; NG/GT:557.7; TPN:2063] Out: 3735 [Urine:3085; Drains:250; Stool:400] Intake/Output from this shift:    Labs:  Recent Labs  04/12/16 0430 04/13/16 0500 04/14/16 0405  WBC 8.1 8.8 10.4  HGB 7.5* 7.7* 7.6*  HCT 25.4* 24.6* 24.5*  PLT 405* 422* 447*     Recent Labs  04/12/16 0430 04/12/16 0431 04/13/16 0500 04/14/16 0405  NA 136 135 134* 132*  K 4.0 4.0 4.0 3.9  CL 97* 96* 95* 92*  CO2 '30 30 31 30  ' GLUCOSE 102* 104* 110* 123*  BUN 20 20 21* 22*  CREATININE 1.01* 0.91 0.87 0.85  CALCIUM 8.0* 7.9* 8.0* 8.1*  MG 1.9  --   --  2.0  PHOS 4.0 4.0 3.8 3.8  PROT 5.8*  --   --   --   ALBUMIN 1.5* 1.5* 1.4* 1.5*  AST 24  --   --   --   ALT 10*  --   --   --   ALKPHOS 64  --   --   --   BILITOT 0.3  --   --   --    Estimated Creatinine Clearance: 105 mL/min (by C-G formula based on Cr of 0.85).    Recent Labs  04/13/16 1137 04/13/16 2248 04/13/16 2343  GLUCAP 86 134* 105*    Medications:  Scheduled:  . antiseptic oral rinse  7 mL Mouth Rinse 10 times per day  . arformoterol  15 mcg Nebulization BID  . budesonide (PULMICORT) nebulizer solution  0.25 mg Nebulization BID  . chlorhexidine gluconate (SAGE KIT)  15 mL Mouth Rinse BID  . enoxaparin (LOVENOX) injection  40 mg Subcutaneous Q24H  . feeding supplement (PRO-STAT SUGAR FREE  64)  60 mL Per Tube BID  . feeding supplement (VITAL HIGH PROTEIN)  1,000 mL Per Tube Q24H  . furosemide  20 mg Intravenous BID  . levothyroxine  200 mcg Oral QAC breakfast  . pantoprazole (PROTONIX) IV  40 mg Intravenous QHS  . sodium chloride flush  10-40 mL Intracatheter Q12H    Insulin Requirements in the past 24 hours:  SSI d/c'd  Admission: 60 YOF who was 2 weeks out from emergency colectomy/colostomy and discharged on 03/25/16. Noted to have feculent discharge from midline wound when having staples removed and diagnosed with necrotic stoma, peritonitis, and fascial necrosis. Pharmacy consulted to start TPN due to expected prolonged ileus.   Surgeries/Procedures: ~2 wks pta: emergency colectomy/colostomy ([facility]man's) 4/6: colostomy removal and end stapling of stoma, partial colectomy, ex-lap, omentectomy, wound debridement 4/7: lap, VAC change, possible debridement 4/10: Re-exploration of abdomen, Wound VAC change 4/12: Re-exploration of abdomen, placement of abdominal wall closure device 4/13: Trach placed  4/14, 4/17: Wound VAC change  GI: prealbumin 3.2 > 13.  Drain with 287m out. TF held 4/16 d/t bilious liquid coming out from mouth/nose.Resumed feeds yesterday since ostomy output improving. Vital HP up to  30 mL/hr. VAC change 4/21.  PPI-IV  Endo: hypothyroid on IV Synthroid. Gout. No hx DM - SSI stopped, serum gluc 123 Lytes: K 3.9 (goal >/= 4 for ileus), Mag 2. Phos 3.8, Ca 8 (CoCa 9.76) Renal: SCr stable, normalized CrCL 71 ml/min - UOP 0.8 ml/kg/hr (Lasix x2), NS at 10 ml/hr. Furosemide 78m IV twice daily  Pulm: COPD / asthma. Vent post-op, trached 4/13, FiO2 40%, no wean until abd closed. Brovana, Pulmicort Cards: HTN - BP soft on Levophed, brady Hepatobil: LFTs / tbili / TG WNL. No lipid with TPN since initiation, only through Propofol which was d/c'ed on 4/13. Neuro: Fentanyl/Versed gtts- weaning, sedation d/t open abd - GCS 12, CPOT 0, RASS at goal -2 ID: s/p  10d of Cipro/Flagyl for intra-abd infxn. CoNS in urine and blood (1 of 2). Afebrile, WBC wnl this AM. New blood cx drawn 4/18, pending.   Best Practices: Lovenox, PPI IV, MC TPN Access: PICC 03/29/16 TPN start date: 03/29/16  Current Nutrition:  Vital HP @ 30 mL/hr   Prostat 634mBID (30g and 200 kCal per serving)  Clinimix E 5/15 at 70 mL/hr + 20% ILE at 10 mL/hr   Nutritional Goals: 1300-1477 kCal and 145-155 gm protein per day    Plan:  - Decrease Clinimix E 5/15 to 40 ml/hr + continue 20% ILE at 10 ml/hr only on Mon/Wed/Fri to prevent EFAD. TPN plus trickle feeds will provide goal calories. Unable to meet patient's protein needs d/t limitations of Clinimix. - Daily multivitamin and trace elements in TPN - TPN labs Mon/Thurs - Monitor tolerance tube feeds and plans for diet advancement tomorrow    BeAlbertina ParrPharmD., BCPS Clinical Pharmacist Pager [phone]

## 2016-04-14 NOTE — Progress Notes (Signed)
PULMONARY / CRITICAL CARE MEDICINE   Name: Christina Castaneda MRN: [id number] DOB: [birth year]    ADMISSION DATE:  03/28/2016 CONSULTATION DATE:  03/29/2016  REFERRING MD:  CCS  CHIEF COMPLAINT:  Colostomy bag leaking  60 yo female had diverticulitis with sigmoid colon resection, appendectomy at [place], [place] on 3/20 and required colostomy.Came home to [place] , [place]  Noted to have feculent material from midline wound and present to ER on 4/5. S/p laparotomy, colostomy removal and end stapling of stoma, partial colectomy, omentectomy, debridement of wound--Dr. Hulen Skains, subsequent ruq stoma and washout, subsequent abra placement by Dr Redmond Pulling    MICROBIOLOGY: Blood Ctx x1 4/9>>> Coag neg staph Urine Ctx 4/9>>> Coag neg staph Tracheal Asp Ctx 4/9>>> yeast Blood Ctx x2 4/18>>> negative Urine Ctx 4/5:  Multiple species present MRSA PCR 4/6:  Negative   ANTIBIOTICS: Cipro 4/5>>> off Flagyl 4/5>>>off Vanco 4/11 >>off   LINES/TUBES: ETT 4/6>>> 4/13 R PICC 4/6>>> L RADIAL ART LINE 4/7>>>4/15 #6 cuffed Trach Hyman Bible) 4/13 >> Rt femoral aline 4/15 >>    SIGNIFICANT EVENTS: 3/20 Exp lap appendectomy,removal sigmoid colon, bowel repair in setting diverticulitis. [facility] 4/6 Exp Lap , colostomy removal, partial colectomy,omentectom, wound debridement, open wound with wound vac. 4/10 Start TPN 04/13/16 - Can lighten sedation  with open abdomen. Remains critically ill , intubated on  Low dose levo gtt .  on lower doses versed/ fent gtt  Afebrile   SUBJECTIVE/OVERNIGHT/INTERVAL HX 04/14/16 - tapering down levophed - 25mcg/min. On fent 372mcg. On trickle feed x 1 day. Still on TPN. Followed commands on 357mcg fent - per RN. lowring sedation makes her agitated - per RN CCS does not want vent dysnchronly due to open abd  VITAL SIGNS: BP 101/53 mmHg  Pulse 76  Temp(Src) 99.8 F (37.7 C) (Axillary)  Resp 18  Ht 5\' 6"  (1.676 m)  Wt 153.316 kg (338 lb)  BMI 54.58 kg/m2  SpO2 97%  LMP  11/18/2012  HEMODYNAMICS: CVP:  [8 mmHg-11 mmHg] 8 mmHg  VENTILATOR SETTINGS: Vent Mode:  [-] PRVC FiO2 (%):  [40 %] 40 % Set Rate:  [16 bmp] 16 bmp Vt Set:  [550 mL] 550 mL PEEP:  [5 cmH20] 5 cmH20 Plateau Pressure:  [15 cmH20-18 cmH20] 16 cmH20  INTAKE / OUTPUT: I/O last 3 completed shifts: In: 5525.2 [I.V.:1458.5; NG/GT:887.7] Out: 5070 [id number]; Drains:350; Stool:400]  PHYSICAL EXAMINATION: General: sedated, acutely ill, obese Neuro: RASS -2 on fent gtt at this poin HEENT: trach site clean Cardiac: regular Chest: decreased BS BL. Coarse. Sync with vent Abd: wound vac, abra closure device Ext: 2+ edema Skin: no rashes  LABS:  PULMONARY  Recent Labs Lab 04/08/16 0423 04/08/16 0453 04/09/16 0433 04/14/16 0345  PHART 7.309* 7.309* 7.385 7.480*  PCO2ART 53.7* 54.9* 53.4* 47.8*  PO2ART 169* 170.0* 93.0 89.0  HCO3 26.2* 27.7* 31.9* 35.7*  TCO2 27.8 29 34 37  O2SAT 99.0 99.0 97.0 97.0    CBC  Recent Labs Lab 04/12/16 0430 04/13/16 0500 04/14/16 0405  HGB 7.5* 7.7* 7.6*  HCT 25.4* 24.6* 24.5*  WBC 8.1 8.8 10.4  PLT 405* 422* 447*    COAGULATION No results for input(s): INR in the last 168 hours.  CARDIAC  No results for input(s): TROPONINI in the last 168 hours. No results for input(s): PROBNP in the last 168 hours.   CHEMISTRY  Recent Labs Lab 04/09/16 0225  04/10/16 0359 04/11/16 0340 04/12/16 0430 04/12/16 0431 04/13/16 0500 04/14/16 0405  NA 137  < >  --  136  136 136 135 134* 132*  K 3.7  < >  --  4.0  4.0 4.0 4.0 4.0 3.9  CL 101  < >  --  98*  98* 97* 96* 95* 92*  CO2 29  < >  --  30  30 30 30 31 30   GLUCOSE 127*  < >  --  110*  111* 102* 104* 110* 123*  BUN 21*  < >  --  21*  21* 20 20 21* 22*  CREATININE 0.91  < >  --  0.86  0.89 1.01* 0.91 0.87 0.85  CALCIUM 7.9*  < >  --  7.9*  7.9* 8.0* 7.9* 8.0* 8.1*  MG 1.7  --  1.8 2.1 1.9  --   --  2.0  PHOS 2.8  < >  --  3.9 4.0 4.0 3.8 3.8  < > = values in this interval not  displayed. Estimated Creatinine Clearance: 105 mL/min (by C-G formula based on Cr of 0.85).   LIVER  Recent Labs Lab 04/09/16 0225  04/11/16 0340 04/12/16 0430 04/12/16 0431 04/13/16 0500 04/14/16 0405  AST 18  --   --  24  --   --   --   ALT 11*  --   --  10*  --   --   --   ALKPHOS 60  --   --  64  --   --   --   BILITOT 0.2*  --   --  0.3  --   --   --   PROT 5.7*  --   --  5.8*  --   --   --   ALBUMIN 1.3*  < > 1.4* 1.5* 1.5* 1.4* 1.5*  < > = values in this interval not displayed.   INFECTIOUS No results for input(s): LATICACIDVEN, PROCALCITON in the last 168 hours.   ENDOCRINE CBG (last 3)   Recent Labs  04/13/16 2248 04/13/16 2343 04/14/16 1154  GLUCAP 134* 105* 112*         IMAGING x48h  - image(s) personally visualized  -   highlighted in bold No results found.     ASSESSMENT / PLAN:  PULMONARY A: Acute resp failure , prolonged ventialtion  s/p tracheostomy. Hx of COPD with asthma, OSA >> wheezing noted 4/14. Tobacco abuse.   - on full vent support 40%  P:    SBTs with goal ATC daytime - ful vent support otherwise Brovana, pulmicort with prn albuterol  CARDIOVASCULAR A:  Septic shock. Hx of HTN. Hypervolemia >> great response to lasix 4/15. Echo - nml LV fn, nml RV   - improving pressor needed P:  Levophed gtt  to keep MAP > 65 -  RENAL A:   AKI >> resolved. Hypokalemia. Hypomagnesemia. P:   Monitor renal fx, urine outpt Replace electrolytes as needed  lasix to 20 q 12h for equal balance  GASTROINTESTINAL A:   Peritonitis s/p laparotomy. Trickle tube feeds as tolerated per CCS >> held 4/16 due to increase residual/ vomiting Protein calorie malnutrition  Morbid obesity P:   Post-op care, TPN per CCS,abra closure device being advanced last 4/19 Protonix for SUP  trickle TFs now that ostomy output improving since 04/13/16   HEMATOLOGIC A:   Anemia of critical illness. P:  F/u CBC Transfuse for Hb < 7 Lovenox for  DVT prevention  INFECTIOUS A:   Septic shock 2nd to peritonitis and cellulitis of ostomy site. Coag neg Staph bacteremia, UTI.  Hx of PCN allergy. P:   cipro, flagyl for peritonitis- stopped dc vancomycin for Coag neg staph x 7ds Rpt blood cx shows clearance   ENDOCRINE A:   Hx of hypothyroidism. P:   Synthroid IV -change to via tube   NEUROLOGIC A:   Acute metabolic encephalopathy. Bradycardia from diprivan. P:   RASS goal: -1 Dc Versed gtt- change to prn Ct fent gtt   No family at bedside  The patient is critically ill with multiple organ systems failure and requires high complexity decision making for assessment and support, frequent evaluation and titration of therapies, application of advanced monitoring technologies and extensive interpretation of multiple databases.   Critical Care Time devoted to patient care services described in this note is  30  Minutes. This time reflects time of care of this signee Dr Brand Males. This critical care time does not reflect procedure time, or teaching time or supervisory time of PA/NP/Med student/Med Resident etc but could involve care discussion time    Dr. Brand Males, M.D., [facility].C.P Pulmonary and Critical Care Medicine Staff Physician [place] [facility] Pager: [phone], If no answer or between  15:00h - 7:00h: call [phone]  04/14/2016 1:32 PM

## 2016-04-14 NOTE — Progress Notes (Signed)
Progress Note: General Surgery Service   Subjective: Versed weaned to off, TF restarted  Objective: Vital signs in last 24 hours: Temp:  [98.2 F (36.8 C)-100.2 F (37.9 C)] 98.2 F (36.8 C) (04/22 0338) Pulse Rate:  [59-84] 59 (04/22 0600) Resp:  [14-24] 18 (04/22 0600) BP: (87-133)/(42-65) 94/42 mmHg (04/22 0600) SpO2:  [96 %-100 %] 99 % (04/22 0600) Arterial Line BP: (103-143)/(47-68) 106/49 mmHg (04/22 0600) FiO2 (%):  [40 %] 40 % (04/22 0348) Weight:  [153.316 kg (338 lb)] 153.316 kg (338 lb) (04/22 0600) Last BM Date: 04/12/16 (RUQ ostomy putting out stool)  Intake/Output from previous day: 04/21 0701 - 04/22 0700 In: 3585.9 [I.V.:965.2; NG/GT:557.7; TPN:2063] Out: 3735 [Urine:3085; Drains:250; Stool:400] Intake/Output this shift:    Lungs: coarse b/l  Cardiovascular: bradycardic  Abd: open abdomen with ABRA in place, ostomy with liquid output  Extremities: + edema improved from previous  Neuro: intubated sedated GCS 6T  Lab Results: CBC   Recent Labs  04/13/16 0500 04/14/16 0405  WBC 8.8 10.4  HGB 7.7* 7.6*  HCT 24.6* 24.5*  PLT 422* 447*   BMET  Recent Labs  04/13/16 0500 04/14/16 0405  NA 134* 132*  K 4.0 3.9  CL 95* 92*  CO2 31 30  GLUCOSE 110* 123*  BUN 21* 22*  CREATININE 0.87 0.85  CALCIUM 8.0* 8.1*   PT/INR No results for input(s): LABPROT, INR in the last 72 hours. ABG  Recent Labs  04/14/16 0345  PHART 7.480*  HCO3 35.7*    Studies/Results:  Anti-infectives: Anti-infectives    Start     Dose/Rate Route Frequency Ordered Stop   04/03/16 2100  vancomycin (VANCOCIN) 1,500 mg in sodium chloride 0.9 % 500 mL IVPB  Status:  Discontinued     1,500 mg 250 mL/hr over 120 Minutes Intravenous Every 12 hours 04/02/16 2020 04/06/16 0943   04/02/16 2030  vancomycin (VANCOCIN) 2,500 mg in sodium chloride 0.9 % 500 mL IVPB     2,500 mg 250 mL/hr over 120 Minutes Intravenous  Once 04/02/16 2020 04/02/16 2306   03/29/16 1200   ciprofloxacin (CIPRO) IVPB 400 mg  Status:  Discontinued     400 mg 200 mL/hr over 60 Minutes Intravenous Every 12 hours 03/29/16 0213 04/06/16 0943   03/29/16 0600  [MAR Hold]  ciprofloxacin (CIPRO) IVPB 400 mg     (MAR Hold since 03/28/16 2357)   400 mg 200 mL/hr over 60 Minutes Intravenous On call to O.R. 03/28/16 2306 03/28/16 2355   03/29/16 0600  [MAR Hold]  metroNIDAZOLE (FLAGYL) IVPB 500 mg     (MAR Hold since 03/28/16 2357)   500 mg 100 mL/hr over 60 Minutes Intravenous On call to O.R. 03/28/16 2306 03/29/16 0025   03/29/16 0600  metroNIDAZOLE (FLAGYL) IVPB 500 mg  Status:  Discontinued     500 mg 100 mL/hr over 60 Minutes Intravenous Every 8 hours 03/29/16 0213 04/06/16 0943   03/28/16 2300  ertapenem (INVANZ) 1 g in sodium chloride 0.9 % 50 mL IVPB  Status:  Discontinued     1 g 100 mL/hr over 30 Minutes Intravenous  Once 03/28/16 2259 03/28/16 2306      Medications: Scheduled Meds: . antiseptic oral rinse  7 mL Mouth Rinse 10 times per day  . arformoterol  15 mcg Nebulization BID  . budesonide (PULMICORT) nebulizer solution  0.25 mg Nebulization BID  . chlorhexidine gluconate (SAGE KIT)  15 mL Mouth Rinse BID  . enoxaparin (LOVENOX) injection  40 mg  Subcutaneous Q24H  . feeding supplement (PRO-STAT SUGAR FREE 64)  60 mL Per Tube BID  . feeding supplement (VITAL HIGH PROTEIN)  1,000 mL Per Tube Q24H  . furosemide  20 mg Intravenous BID  . levothyroxine  200 mcg Oral QAC breakfast  . pantoprazole (PROTONIX) IV  40 mg Intravenous QHS  . sodium chloride flush  10-40 mL Intracatheter Q12H   Continuous Infusions: . Marland KitchenTPN (CLINIMIX-E) Adult 70 mL/hr at 04/14/16 0700   And  . fat emulsion 240 mL (04/14/16 0700)  . sodium chloride 10 mL/hr at 04/14/16 0700  . fentaNYL infusion INTRAVENOUS 300 mcg/hr (04/14/16 0700)  . norepinephrine (LEVOPHED) Adult infusion 4.053 mcg/min (04/14/16 0700)   PRN Meds:.acetaminophen (TYLENOL) oral liquid 160 mg/5 mL, albuterol, fentaNYL,  iopamidol, midazolam, [DISCONTINUED] ondansetron **OR** ondansetron (ZOFRAN) IV, sodium chloride flush  Assessment/Plan: Patient Active Problem List   Diagnosis Date Noted  . Acute respiratory failure ([place])   . Pressure ulcer 04/01/2016  . 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 Hypothyroid 03/29/2016  . Morbid obesity ([place]) 03/29/2016  . Open wound of abdomen 03/29/2016  . 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 Obesity 11/12/2012   s/p Procedure(s): EXPLORATORY LAPAROTOMY, PLACEMENT OF ABRA ABDOMINAL WALL CLOSURE SET  04/04/2016 -continue tube feeds -continue TPN -myofascial manipulation performed at bedside -plan for vac change monday   LOS: 17 days   Mickeal Skinner, MD Pg# [phone] [facility]

## 2016-04-15 ENCOUNTER — Inpatient Hospital Stay (HOSPITAL_COMMUNITY): Payer: BLUE CROSS/BLUE SHIELD

## 2016-04-15 LAB — BASIC METABOLIC PANEL
ANION GAP: 10 (ref 5–15)
BUN: 24 mg/dL — ABNORMAL HIGH (ref 6–20)
CHLORIDE: 93 mmol/L — AB (ref 101–111)
CO2: 32 mmol/L (ref 22–32)
Calcium: 8.1 mg/dL — ABNORMAL LOW (ref 8.9–10.3)
Creatinine, Ser: 0.86 mg/dL (ref 0.44–1.00)
GFR calc non Af Amer: >60 mL/min (ref >60)
GLUCOSE: 102 mg/dL — AB (ref 65–99)
Potassium: 3.5 mmol/L (ref 3.5–5.1)
Sodium: 135 mmol/L (ref 135–145)

## 2016-04-15 LAB — RENAL FUNCTION PANEL
ALBUMIN: 1.5 g/dL — AB (ref 3.5–5.0)
Anion gap: 7 (ref 5–15)
BUN: 25 mg/dL — ABNORMAL HIGH (ref 6–20)
CALCIUM: 8.1 mg/dL — AB (ref 8.9–10.3)
CO2: 33 mmol/L — AB (ref 22–32)
Chloride: 93 mmol/L — ABNORMAL LOW (ref 101–111)
Creatinine, Ser: 0.92 mg/dL (ref 0.44–1.00)
GFR calc Af Amer: >60 mL/min (ref >60)
GFR calc non Af Amer: >60 mL/min (ref >60)
GLUCOSE: 103 mg/dL — AB (ref 65–99)
PHOSPHORUS: 3.5 mg/dL (ref 2.5–4.6)
Potassium: 3.4 mmol/L — ABNORMAL LOW (ref 3.5–5.1)
SODIUM: 133 mmol/L — AB (ref 135–145)

## 2016-04-15 LAB — CBC WITH DIFFERENTIAL/PLATELET
BASOS ABS: 0 10*3/uL (ref 0.0–0.1)
BASOS PCT: 0 %
Eosinophils Absolute: 0 10*3/uL (ref 0.0–0.7)
Eosinophils Relative: 0 %
HEMATOCRIT: 24.9 % — AB (ref 36.0–46.0)
Hemoglobin: 7.5 g/dL — ABNORMAL LOW (ref 12.0–15.0)
LYMPHS PCT: 14 %
Lymphs Abs: 1.6 10*3/uL (ref 0.7–4.0)
MCH: 28 pg (ref 26.0–34.0)
MCHC: 30.1 g/dL (ref 30.0–36.0)
MCV: 92.9 fL (ref 78.0–100.0)
MONO ABS: 0.8 10*3/uL (ref 0.1–1.0)
MONOS PCT: 7 %
NEUTROS ABS: 8.8 10*3/uL — AB (ref 1.7–7.7)
NEUTROS PCT: 79 %
Platelets: 440 10*3/uL — ABNORMAL HIGH (ref 150–400)
RBC: 2.68 MIL/uL — ABNORMAL LOW (ref 3.87–5.11)
RDW: 17.2 % — AB (ref 11.5–15.5)
WBC: 11.2 10*3/uL — ABNORMAL HIGH (ref 4.0–10.5)

## 2016-04-15 LAB — CULTURE, BLOOD (ROUTINE X 2)
CULTURE: NO GROWTH
CULTURE: NO GROWTH

## 2016-04-15 LAB — GLUCOSE, CAPILLARY
GLUCOSE-CAPILLARY: 102 mg/dL — AB (ref 65–99)
Glucose-Capillary: 102 mg/dL — ABNORMAL HIGH (ref 65–99)
Glucose-Capillary: 110 mg/dL — ABNORMAL HIGH (ref 65–99)

## 2016-04-15 MED ORDER — POTASSIUM CHLORIDE 20 MEQ/15ML (10%) PO SOLN: 20.0000 meq | ORAL | Status: DC

## 2016-04-15 MED ORDER — M.V.I. ADULT IV INJ
INJECTION | INTRAVENOUS | Status: AC
  Administered 2016-04-15: 17:00:00 via INTRAVENOUS
  Filled 2016-04-15: qty 1680

## 2016-04-15 MED ORDER — LEVOTHYROXINE SODIUM 100 MCG IV SOLR
100.0000 ug | Freq: Every day | INTRAVENOUS | Status: DC
  Administered 2016-04-15 – 2016-04-18 (×4): 100 ug via INTRAVENOUS
  Filled 2016-04-15 (×4): qty 5

## 2016-04-15 MED ORDER — MIDAZOLAM HCL 2 MG/2ML IJ SOLN
1.0000 mg | INTRAMUSCULAR | Status: DC | PRN
  Administered 2016-04-15 – 2016-04-16 (×7): 1 mg via INTRAVENOUS
  Filled 2016-04-15 (×6): qty 2

## 2016-04-15 MED ORDER — POTASSIUM CHLORIDE 10 MEQ/50ML IV SOLN
10.0000 meq | INTRAVENOUS | Status: AC
  Administered 2016-04-15 (×3): 10 meq via INTRAVENOUS
  Filled 2016-04-15 (×3): qty 50

## 2016-04-15 NOTE — Progress Notes (Signed)
PARENTERAL NUTRITION CONSULT NOTE - FOLLOW UP  Pharmacy Consult for TPN Indication: Intolerance to EN (expected to prolong ileus)  Allergies  Allergen Reactions  . Penicillins Anaphylaxis, Hives, Shortness Of Breath, Swelling and Other (See Comments)    Angioedema    Patient Measurements: Height: '5\' 6"'  (167.6 cm) Weight: (!) 336 lb (152.409 kg) IBW/kg (Calculated) : 59.3   Vital Signs: Temp: 98.4 F (36.9 C) (04/23 0722) Temp Source: Oral (04/23 0722) BP: 106/56 mmHg (04/23 0730) Pulse Rate: 75 (04/23 0730) Intake/Output from previous day: 04/22 0701 - 04/23 0700 In: 3026.8 [I.V.:872.3; NG/GT:880; TPN:1274.5] Out: 4945 [Urine:3720; Drains:350; Stool:875] Intake/Output from this shift:    Labs:  Recent Labs  04/13/16 0500 04/14/16 0405 04/15/16 0352  WBC 8.8 10.4 11.2*  HGB 7.7* 7.6* 7.5*  HCT 24.6* 24.5* 24.9*  PLT 422* 447* 440*     Recent Labs  04/13/16 0500 04/14/16 0405 04/15/16 0352  NA 134* 132* 135  133*  K 4.0 3.9 3.5  3.4*  CL 95* 92* 93*  93*  CO2 31 30 32  33*  GLUCOSE 110* 123* 102*  103*  BUN 21* 22* 24*  25*  CREATININE 0.87 0.85 0.86  0.92  CALCIUM 8.0* 8.1* 8.1*  8.1*  MG  --  2.0  --   PHOS 3.8 3.8 3.5  ALBUMIN 1.4* 1.5* 1.5*   Estimated Creatinine Clearance: 96.6 mL/min (by C-G formula based on Cr of 0.92).    Recent Labs  04/13/16 2343 04/14/16 1154 04/14/16 2315  GLUCAP 105* 112* 115*    Medications:  Scheduled:  . antiseptic oral rinse  7 mL Mouth Rinse 10 times per day  . arformoterol  15 mcg Nebulization BID  . budesonide (PULMICORT) nebulizer solution  0.25 mg Nebulization BID  . chlorhexidine gluconate (SAGE KIT)  15 mL Mouth Rinse BID  . enoxaparin (LOVENOX) injection  40 mg Subcutaneous Q24H  . feeding supplement (PRO-STAT SUGAR FREE 64)  60 mL Per Tube BID  . feeding supplement (VITAL HIGH PROTEIN)  1,000 mL Per Tube Q24H  . furosemide  20 mg Intravenous BID  . levothyroxine  200 mcg Oral QAC  breakfast  . pantoprazole (PROTONIX) IV  40 mg Intravenous QHS  . potassium chloride  20 mEq Per Tube Q4H  . sodium chloride flush  10-40 mL Intracatheter Q12H    Insulin Requirements in the past 24 hours:  SSI d/c'd  Admission: [place] who was 2 weeks out from emergency colectomy/colostomy and discharged on 03/25/16. Noted to have feculent discharge from midline wound when having staples removed and diagnosed with necrotic stoma, peritonitis, and fascial necrosis. Pharmacy consulted to start TPN due to expected prolonged ileus.   Surgeries/Procedures: ~2 wks pta: emergency colectomy/colostomy (Hartman's) 4/6: colostomy removal and end stapling of stoma, partial colectomy, ex-lap, omentectomy, wound debridement 4/7: lap, VAC change, possible debridement 4/10: Re-exploration of abdomen, Wound VAC change 4/12: Re-exploration of abdomen, placement of abdominal wall closure device 4/13: Trach placed  4/14, 4/17: Wound VAC change  GI: prealbumin 3.2 > 13.  Drain with 224m out. VAC change 4/21.  PPI-IV. TF were resumed yesterday but patient vomited three times yesterday. TF have been held but surgery not concerned for obstruction since ostomy has flatus and stool  Endo: hypothyroid on IV Synthroid. Gout. No hx DM - SSI stopped, serum gluc 123 Lytes: K 3.4 (goal >/= 4 for ileus), Mag 2. Phos 3.5, Ca 8 (CoCa 10.1) Renal: SCr stable, normalized CrCL 71 ml/min - UOP 1  ml/kg/hr (Lasix x2), NS at 10 ml/hr. Furosemide 55m IV twice daily  Pulm: COPD / asthma. Vent post-op, trached 4/13, FiO2 30%, no wean until abd closed. Brovana, Pulmicort Cards: HTN - BP soft on Levophed, HR improved  Hepatobil: LFTs / tbili / TG WNL.  Neuro: Fentanyl/Versed gtts- weaning, sedation d/t open abd - GCS 12, CPOT 0, RASS at goal -2 ID: s/p 10d of Cipro/Flagyl for intra-abd infxn. CoNS in urine and blood (1 of 2). Afebrile, WBC wnl this AM. New blood cx drawn 4/18, pending.   Best Practices: Lovenox, PPI IV,  MC TPN Access: PICC 03/29/16 TPN start date: 03/29/16  Current Nutrition:  Vital HP @ 30 mL/hr   Prostat 623mBID (30g and 200 kCal per serving)  Clinimix E 5/15 at 30 mL/hr + 20% ILE at 10 mL/hr on M/W/F  Nutritional Goals: 1300-1477 kCal and 145-155 gm protein per day    Plan:  - Increase Clinimix E 5/15 back to 70 ml/hr + continue 20% ILE at 10 ml/hr only on Mon/Wed/Fri to prevent EFAD. Unable to meet patient's protein needs d/t limitations of Clinimix. - KCl x 3 runs ordered per MD  - Daily multivitamin and trace elements in TPN - TPN labs Mon/Thurs    BeAlbertina ParrPharmD., BCPS Clinical Pharmacist Pager [phone]

## 2016-04-15 NOTE — Progress Notes (Signed)
[facility] ADULT ICU REPLACEMENT PROTOCOL FOR AM LAB REPLACEMENT ONLY  The patient does apply for the [facility] Adult ICU Electrolyte Replacment Protocol based on the criteria listed below:   1. Is GFR >/= 40 ml/min? Yes.    Patient's GFR today is >60 2. Is urine output >/= 0.5 ml/kg/hr for the last 6 hours? Yes.   Patient's UOP is 0.5 ml/kg/hr 3. Is BUN < 60 mg/dL? Yes.    Patient's BUN today is 24 4. Abnormal electrolyte(s): K 3.5 5. Ordered repletion with: per protocol 6. If a panic level lab has been reported, has the CCM MD in charge been notified? No..   Physician:    Ronda Fairly A 04/15/2016 6:08 AM

## 2016-04-15 NOTE — Op Note (Signed)
Preoperative diagnosis: open abdomen  Postoperative diagnosis: open abdomen   Procedure: myofascial advancement, vac change of 50-100 cm^2  Surgeon: Gurney Maxin, M.D.  Asst: Jomarie Longs, PA  Anesthesia: fentanyl and versed  Indications for procedure: Christina Castaneda is a 60 y.o. year old female with symptoms of dead colostomy, s/p resection with complication of open abdomen.  Description of procedure: WHO checklist was applied. The patient was prepped and draped in the usual sterile fashion. The previous vac was taken down. The fashion was measured at 4cm in the middle with the lower area of 6cm. The myofascial maneuver was performed. The bands were tightened to 2cm In the lower abdomen and 1.5cm around the ostomy. The myofascial maneuver was performed again. The fascial defect was 3.5cm in the middle with 6cm at the lower area. The vac was replaced in standard fashion with good suction  Findings: 3.5 and 6cm defect  Specimen: none  Implant: vac  Blood loss: <49ml  Local anesthesia: 0 ml 0.25% marcaine with epinephrine  Complications: none  Gurney Maxin, M.D. General, Bariatric, & Minimally Invasive Surgery [facility]

## 2016-04-15 NOTE — Progress Notes (Signed)
Patient vomited cream colored vomitus once around 2215, stopped tube feeding and E Link MD notified and ordered to restart tube feeding 2 hours later if no residuals. Nausea relieved per patient. Tube feeding restarted two hours later with no residuals, patient vomited gain cream colored with 20 ml of residual and later vomited green colored vomitus. Stopped Tube feeding and IV Zofran given once for nausea. Head of bed has been elevated and will continue to monitor. Levo drip stopped. Suctioned trach multiple times.

## 2016-04-15 NOTE — Progress Notes (Signed)
PULMONARY / CRITICAL CARE MEDICINE   Name: Christina Castaneda MRN: [id number] DOB: January 16, 1956    ADMISSION DATE:  03/28/2016 CONSULTATION DATE:  03/29/2016  REFERRING MD:  CCS  CHIEF COMPLAINT:  Colostomy bag leaking  60 yo female had diverticulitis with sigmoid colon resection, appendectomy at [place], [place] on 3/20 and required colostomy.Came home to [place] , [place]  Noted to have feculent material from midline wound and present to ER on 4/5. S/p laparotomy, colostomy removal and end stapling of stoma, partial colectomy, omentectomy, debridement of wound--Dr. Hulen Skains, subsequent ruq stoma and washout, subsequent abra placement by Dr Redmond Pulling    MICROBIOLOGY: Blood Ctx x1 4/9>>> Coag neg staph Urine Ctx 4/9>>> Coag neg staph Tracheal Asp Ctx 4/9>>> yeast Blood Ctx x2 4/18>>> negative Urine Ctx 4/5:  Multiple species present MRSA PCR 4/6:  Negative   ANTIBIOTICS: Cipro 4/5>>> off Flagyl 4/5>>>off Vanco 4/11 >>off   LINES/TUBES: ETT 4/6>>> 4/13 R PICC 4/6>>> L RADIAL ART LINE 4/7>>>4/15 #6 cuffed Trach [employer]) 4/13 >> Rt femoral aline 4/15 >>    SIGNIFICANT EVENTS: 3/20 Exp lap appendectomy,removal sigmoid colon, bowel repair in setting diverticulitis. [facility] 4/6 Exp Lap , colostomy removal, partial colectomy,omentectom, wound debridement, open wound with wound vac. 4/10 Start TPN 04/13/16 - Can lighten sedation  with open abdomen. Remains critically ill , intubated on  Low dose levo gtt .  on lower doses versed/ fent gtt  Afebrile   SUBJECTIVE/OVERNIGHT/INTERVAL HX 04/15/16 -Weaned off pressors last pm, TF on hold due to vomiting . KUB without obstruction. surgery left TF on hold and TPN increased. Following commands on 367mcg fent Weaned 10/5 for 2 hr 4/23    VITAL SIGNS: BP 121/65 mmHg  Pulse 70  Temp(Src) 98.4 F (36.9 C) (Oral)  Resp 13  Ht 5\' 6"  (1.676 m)  Wt 336 lb (152.409 kg)  BMI 54.26 kg/m2  SpO2 98%  LMP 11/18/2012  HEMODYNAMICS: CVP:  [11 mmHg] 11 mmHg  VENTILATOR  SETTINGS: Vent Mode:  [-] PRVC FiO2 (%):  [30 %-40 %] 30 % Set Rate:  [16 bmp] 16 bmp Vt Set:  [550 mL] 550 mL PEEP:  [5 cmH20] 5 cmH20 Plateau Pressure:  [16 cmH20-21 cmH20] 21 cmH20  INTAKE / OUTPUT: I/O last 3 completed shifts: In: 5416.9 [I.V.:1554.7; NG/GT:1467.7] Out: [id number] [Urine:5350; Drains:550; [id number]  PHYSICAL EXAMINATION: General: sedated, acutely ill, obese Neuro: RASS 1 on fent gtt  HEENT: trach site clean Cardiac: regular Chest: decreased BS BL. Coarse. Sync with vent Abd: wound vac, abra closure device Ext: 1+ edema Skin: no rashes  LABS:  PULMONARY  Recent Labs Lab 04/09/16 0433 04/14/16 0345  PHART 7.385 7.480*  PCO2ART 53.4* 47.8*  PO2ART 93.0 89.0  HCO3 31.9* 35.7*  TCO2 34 37  O2SAT 97.0 97.0    CBC  Recent Labs Lab 04/13/16 0500 04/14/16 0405 04/15/16 0352  HGB 7.7* 7.6* 7.5*  HCT 24.6* 24.5* 24.9*  WBC 8.8 10.4 11.2*  PLT 422* 447* 440*    COAGULATION No results for input(s): INR in the last 168 hours.  CARDIAC  No results for input(s): TROPONINI in the last 168 hours. No results for input(s): PROBNP in the last 168 hours.   CHEMISTRY  Recent Labs Lab 04/09/16 0225  04/10/16 0359 04/11/16 0340 04/12/16 0430 04/12/16 0431 04/13/16 0500 04/14/16 0405 04/15/16 0352  NA 137  < >  --  136  136 136 135 134* 132* 135  133*  K 3.7  < >  --  4.0  4.0  4.0 4.0 4.0 3.9 3.5  3.4*  CL 101  < >  --  98*  98* 97* 96* 95* 92* 93*  93*  CO2 29  < >  --  30  30 30 30 31 30  32  33*  GLUCOSE 127*  < >  --  110*  111* 102* 104* 110* 123* 102*  103*  BUN 21*  < >  --  21*  21* 20 20 21* 22* 24*  25*  CREATININE 0.91  < >  --  0.86  0.89 1.01* 0.91 0.87 0.85 0.86  0.92  CALCIUM 7.9*  < >  --  7.9*  7.9* 8.0* 7.9* 8.0* 8.1* 8.1*  8.1*  MG 1.7  --  1.8 2.1 1.9  --   --  2.0  --   PHOS 2.8  < >  --  3.9 4.0 4.0 3.8 3.8 3.5  < > = values in this interval not displayed. Estimated Creatinine Clearance: 96.6 mL/min (by C-G  formula based on Cr of 0.92).   LIVER  Recent Labs Lab 04/09/16 0225  04/12/16 0430 04/12/16 0431 04/13/16 0500 04/14/16 0405 04/15/16 0352  AST 18  --  24  --   --   --   --   ALT 11*  --  10*  --   --   --   --   ALKPHOS 60  --  64  --   --   --   --   BILITOT 0.2*  --  0.3  --   --   --   --   PROT 5.7*  --  5.8*  --   --   --   --   ALBUMIN 1.3*  < > 1.5* 1.5* 1.4* 1.5* 1.5*  < > = values in this interval not displayed.   INFECTIOUS No results for input(s): LATICACIDVEN, PROCALCITON in the last 168 hours.   ENDOCRINE CBG (last 3)   Recent Labs  04/13/16 2343 04/14/16 1154 04/14/16 2315  GLUCAP 105* 112* 115*         IMAGING x48h  - image(s) personally visualized  -   highlighted in bold Dg Chest Port 1 View  04/15/2016  CLINICAL DATA:  Endotracheal tube present EXAM: PORTABLE CHEST 1 VIEW COMPARISON:  04/11/2016 FINDINGS: Tracheostomy tube and feeding tube unchanged. RIGHT PICC line unchanged. Stable mildly enlarged cardiac silhouette. There is central venous congestion pattern unchanged. No consolidation or pneumothorax. IMPRESSION: 1. Stable support apparatus. 2. No interval change. 3. Central venous congestion. Electronically Signed   By: Suzy Bouchard M.D.   On: 04/15/2016 09:48   Dg Abd Portable 1v  04/15/2016  CLINICAL DATA:  Emesis 3 times last night. EXAM: PORTABLE ABDOMEN - 1 VIEW COMPARISON:  Abdominal film 04/05/2016 FINDINGS: The feeding tube looped in the stomach with tip in the fundus of the stomach. No dilated loops of large or small bowel. Small amount of contrast in the rectum. IMPRESSION: Feeding tube looped in the stomach with tip in the gastric fundus Electronically Signed   By: Suzy Bouchard M.D.   On: 04/15/2016 11:00       ASSESSMENT / PLAN:  PULMONARY A: Acute resp failure , prolonged ventialtion  s/p tracheostomy. Hx of COPD with asthma, OSA >> wheezing noted 4/14. Tobacco abuse.   - on full vent support 40%  P:     SBTs with goal ATC daytime - ful vent support otherwise Cont to try to wean  Brovana, pulmicort  with prn albuterol  CARDIOVASCULAR A:  Septic shock. Hx of HTN. Hypervolemia >> great response to lasix 4/15. Echo - nml LV fn, nml RV 4/23 >weaned off pressors , remains in neg bal ~5L    P:  Cont to monitor Cont lasix   RENAL A:   AKI >> resolved. Hypokalemia. Hypomagnesemia. Hypokalemia  P:   Monitor renal fx, urine outpt Replace electrolytes as needed  lasix to 20 q 12h for equal balance K+ replaced   GASTROINTESTINAL A:   Peritonitis s/p laparotomy. Trickle tube feeds as tolerated per CCS >> held 4/23 due to  Vomiting>KUB w/ no ileus  Protein calorie malnutrition  Morbid obesity P:   Post-op care, TPN per CCS,abra closure device being advanced last 4/19 Protonix for SUP    HEMATOLOGIC A:   Anemia of critical illness. P:  F/u CBC Transfuse for Hb < 7 Lovenox for DVT prevention  INFECTIOUS A:   Septic shock 2nd to peritonitis and cellulitis of ostomy site. Coag neg Staph bacteremia, UTI. Hx of PCN allergy. P:   cipro, flagyl for peritonitis- stopped Off vancomycin for Coag neg staph x 7ds, Rpt blood cx shows clearance   ENDOCRINE A:   Hx of hypothyroidism. P:   Synthroid IV  NEUROLOGIC A:   Acute metabolic encephalopathy.-improving  Bradycardia from diprivan. P:   RASS goal: 0  Versed gtt-  prn Ct fent gtt    Carson Bogden NP-C  [facility]  [phone]    04/15/2016 11:44 AM

## 2016-04-15 NOTE — Progress Notes (Signed)
Patient ID: Christina Castaneda, female   DOB: 01-31-1956, 60 y.o.   MRN: [id number]  Progress Note: General Surgery Service   Subjective: Had some n/v last night.  Tube feeds on hold.    Objective: Vital signs in last 24 hours: Temp:  [98.4 F (36.9 C)-99.8 F (37.7 C)] 98.4 F (36.9 C) (04/23 0722) Pulse Rate:  [56-91] 77 (04/23 0800) Resp:  [10-26] 10 (04/23 0800) BP: (88-138)/(42-73) 123/62 mmHg (04/23 0800) SpO2:  [90 %-100 %] 99 % (04/23 0800) Arterial Line BP: (97-181)/(44-94) 153/70 mmHg (04/23 0800) FiO2 (%):  [30 %-40 %] 30 % (04/23 0735) Weight:  [152.409 kg (336 lb)] 152.409 kg (336 lb) (04/23 0330) Last BM Date: 04/15/16  Intake/Output from previous day: 04/22 0701 - 04/23 0700 In: 3246.8 [I.V.:1012.3; NG/GT:880; TPN:1354.5] Out: 1950 [Urine:3720; Drains:350; Stool:875] Intake/Output this shift: Total I/O In: 210 [I.V.:40; NG/GT:30; IV Piggyback:100; TPN:40] Out: 430 [Urine:380; Drains:50]  Gen - anxious.  Lungs: coarse b/l  Cardiovascular: bradycardic  Abd: open abdomen with ABRA in place, ostomy with liquid output  Extremities: + edema improved from previous  Neuro: anxious.  Trached, but alert. Able to answer yes/no questions.    Lab Results: CBC   Recent Labs  04/14/16 0405 04/15/16 0352  WBC 10.4 11.2*  HGB 7.6* 7.5*  HCT 24.5* 24.9*  PLT 447* 440*   BMET  Recent Labs  04/14/16 0405 04/15/16 0352  NA 132* 135  133*  K 3.9 3.5  3.4*  CL 92* 93*  93*  CO2 30 32  33*  GLUCOSE 123* 102*  103*  BUN 22* 24*  25*  CREATININE 0.85 0.86  0.92  CALCIUM 8.1* 8.1*  8.1*   PT/INR No results for input(s): LABPROT, INR in the last 72 hours. ABG  Recent Labs  04/14/16 0345  PHART 7.480*  HCO3 35.7*    Studies/Results:  Anti-infectives: Anti-infectives    Start     Dose/Rate Route Frequency Ordered Stop   04/03/16 2100  vancomycin (VANCOCIN) 1,500 mg in sodium chloride 0.9 % 500 mL IVPB  Status:  Discontinued     1,500 mg 250  mL/hr over 120 Minutes Intravenous Every 12 hours 04/02/16 2020 04/06/16 0943   04/02/16 2030  vancomycin (VANCOCIN) 2,500 mg in sodium chloride 0.9 % 500 mL IVPB     2,500 mg 250 mL/hr over 120 Minutes Intravenous  Once 04/02/16 2020 04/02/16 2306   03/29/16 1200  ciprofloxacin (CIPRO) IVPB 400 mg  Status:  Discontinued     400 mg 200 mL/hr over 60 Minutes Intravenous Every 12 hours 03/29/16 0213 04/06/16 0943   03/29/16 0600  [MAR Hold]  ciprofloxacin (CIPRO) IVPB 400 mg     (MAR Hold since 03/28/16 2357)   400 mg 200 mL/hr over 60 Minutes Intravenous On call to O.R. 03/28/16 2306 03/28/16 2355   03/29/16 0600  [MAR Hold]  metroNIDAZOLE (FLAGYL) IVPB 500 mg     (MAR Hold since 03/28/16 2357)   500 mg 100 mL/hr over 60 Minutes Intravenous On call to O.R. 03/28/16 2306 03/29/16 0025   03/29/16 0600  metroNIDAZOLE (FLAGYL) IVPB 500 mg  Status:  Discontinued     500 mg 100 mL/hr over 60 Minutes Intravenous Every 8 hours 03/29/16 0213 04/06/16 0943   03/28/16 2300  ertapenem (INVANZ) 1 g in sodium chloride 0.9 % 50 mL IVPB  Status:  Discontinued     1 g 100 mL/hr over 30 Minutes Intravenous  Once 03/28/16 2259 03/28/16 2306  Medications: Scheduled Meds: . antiseptic oral rinse  7 mL Mouth Rinse 10 times per day  . arformoterol  15 mcg Nebulization BID  . budesonide (PULMICORT) nebulizer solution  0.25 mg Nebulization BID  . chlorhexidine gluconate (SAGE KIT)  15 mL Mouth Rinse BID  . enoxaparin (LOVENOX) injection  40 mg Subcutaneous Q24H  . feeding supplement (PRO-STAT SUGAR FREE 64)  60 mL Per Tube BID  . feeding supplement (VITAL HIGH PROTEIN)  1,000 mL Per Tube Q24H  . furosemide  20 mg Intravenous BID  . levothyroxine  100 mcg Intravenous Daily  . pantoprazole (PROTONIX) IV  40 mg Intravenous QHS  . potassium chloride  10 mEq Intravenous Q1 Hr x 3  . sodium chloride flush  10-40 mL Intracatheter Q12H   Continuous Infusions: . Marland KitchenTPN (CLINIMIX-E) Adult 40 mL/hr at 04/15/16  0800  . sodium chloride 10 mL/hr at 04/15/16 0800  . fentaNYL infusion INTRAVENOUS 300 mcg/hr (04/15/16 0800)  . norepinephrine (LEVOPHED) Adult infusion Stopped (04/14/16 2146)   PRN Meds:.acetaminophen (TYLENOL) oral liquid 160 mg/5 mL, albuterol, fentaNYL, iopamidol, midazolam, [DISCONTINUED] ondansetron **OR** ondansetron (ZOFRAN) IV, sodium chloride flush  Assessment/Plan: Patient Active Problem List   Diagnosis Date Noted  . Acute respiratory failure ([place])   . Pressure ulcer 04/01/2016  . 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 Hypothyroid 03/29/2016  . Morbid obesity ([place]) 03/29/2016  . Open wound of abdomen 03/29/2016  . 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 Obesity 11/12/2012   s/p Procedure(s): EXPLORATORY LAPAROTOMY, PLACEMENT OF ABRA ABDOMINAL WALL CLOSURE SET  04/04/2016 -hold tube feeds secondary to emesis.  Clearly not obstructed because ostomy has flatus and stool.   -continue TPN -tentative vac change planned Monday. - convert synthroid to PO.   LOS: 18 days   Modupe Shampine, [facility]

## 2016-04-16 LAB — COMPREHENSIVE METABOLIC PANEL
ALT: 20 U/L (ref 14–54)
AST: 43 U/L — AB (ref 15–41)
Albumin: 1.8 g/dL — ABNORMAL LOW (ref 3.5–5.0)
Alkaline Phosphatase: 69 U/L (ref 38–126)
Anion gap: 8 (ref 5–15)
BUN: 24 mg/dL — AB (ref 6–20)
CHLORIDE: 97 mmol/L — AB (ref 101–111)
CO2: 31 mmol/L (ref 22–32)
Calcium: 8.2 mg/dL — ABNORMAL LOW (ref 8.9–10.3)
Creatinine, Ser: 0.91 mg/dL (ref 0.44–1.00)
GFR calc Af Amer: >60 mL/min (ref >60)
Glucose, Bld: 107 mg/dL — ABNORMAL HIGH (ref 65–99)
POTASSIUM: 3.3 mmol/L — AB (ref 3.5–5.1)
SODIUM: 136 mmol/L (ref 135–145)
Total Bilirubin: 0.2 mg/dL — ABNORMAL LOW (ref 0.3–1.2)
Total Protein: 6.2 g/dL — ABNORMAL LOW (ref 6.5–8.1)

## 2016-04-16 LAB — GLUCOSE, CAPILLARY: Glucose-Capillary: 107 mg/dL — ABNORMAL HIGH (ref 65–99)

## 2016-04-16 LAB — CBC WITH DIFFERENTIAL/PLATELET
Basophils Absolute: 0 10*3/uL (ref 0.0–0.1)
Basophils Relative: 0 %
EOS ABS: 0.1 10*3/uL (ref 0.0–0.7)
EOS PCT: 1 %
HCT: 24.2 % — ABNORMAL LOW (ref 36.0–46.0)
Hemoglobin: 7.4 g/dL — ABNORMAL LOW (ref 12.0–15.0)
LYMPHS ABS: 1.8 10*3/uL (ref 0.7–4.0)
Lymphocytes Relative: 17 %
MCH: 28.2 pg (ref 26.0–34.0)
MCHC: 30.6 g/dL (ref 30.0–36.0)
MCV: 92.4 fL (ref 78.0–100.0)
MONO ABS: 0.9 10*3/uL (ref 0.1–1.0)
MONOS PCT: 8 %
Neutro Abs: 8 10*3/uL — ABNORMAL HIGH (ref 1.7–7.7)
Neutrophils Relative %: 74 %
PLATELETS: 442 10*3/uL — AB (ref 150–400)
RBC: 2.62 MIL/uL — ABNORMAL LOW (ref 3.87–5.11)
RDW: 17.3 % — ABNORMAL HIGH (ref 11.5–15.5)
WBC: 10.2 10*3/uL (ref 4.0–10.5)

## 2016-04-16 LAB — PREALBUMIN: Prealbumin: 16.1 mg/dL — ABNORMAL LOW (ref 18–38)

## 2016-04-16 LAB — PHOSPHORUS: PHOSPHORUS: 3.6 mg/dL (ref 2.5–4.6)

## 2016-04-16 LAB — TRIGLYCERIDES: TRIGLYCERIDES: 128 mg/dL (ref <150)

## 2016-04-16 LAB — MAGNESIUM: MAGNESIUM: 2 mg/dL (ref 1.7–2.4)

## 2016-04-16 MED ORDER — PANCRELIPASE (LIP-PROT-AMYL) 12000-38000 UNITS PO CPEP
2.0000 | ORAL_CAPSULE | Freq: Once | ORAL | Status: AC
  Administered 2016-04-16: 24000 [IU] via ORAL
  Filled 2016-04-16: qty 2

## 2016-04-16 MED ORDER — SODIUM BICARBONATE 650 MG PO TABS
650.0000 mg | ORAL_TABLET | Freq: Once | ORAL | Status: AC
  Administered 2016-04-16: 650 mg via ORAL
  Filled 2016-04-16: qty 1

## 2016-04-16 MED ORDER — MIDAZOLAM HCL 2 MG/2ML IJ SOLN
INTRAMUSCULAR | Status: AC
  Administered 2016-04-16: 2 mg
  Filled 2016-04-16: qty 2

## 2016-04-16 MED ORDER — FENTANYL 75 MCG/HR TD PT72
100.0000 ug | MEDICATED_PATCH | TRANSDERMAL | Status: DC
  Administered 2016-04-16 – 2016-05-01 (×6): 100 ug via TRANSDERMAL
  Filled 2016-04-16 (×7): qty 1

## 2016-04-16 MED ORDER — POTASSIUM CHLORIDE 10 MEQ/50ML IV SOLN
10.0000 meq | INTRAVENOUS | Status: AC
  Administered 2016-04-16 (×2): 10 meq via INTRAVENOUS
  Filled 2016-04-16 (×2): qty 50

## 2016-04-16 MED ORDER — MIDAZOLAM HCL 2 MG/2ML IJ SOLN: 2.0000 mg | Freq: Once | INTRAMUSCULAR | Status: AC

## 2016-04-16 MED ORDER — TRACE MINERALS CR-CU-MN-SE-ZN 10-1000-500-60 MCG/ML IV SOLN
INTRAVENOUS | Status: AC
  Administered 2016-04-16: 17:00:00 via INTRAVENOUS
  Filled 2016-04-16: qty 1992

## 2016-04-16 NOTE — Progress Notes (Addendum)
[facility] NOTE  Pharmacy Consult for TPN Indication: Intolerance to EN (expected to prolong ileus)  Allergies  Allergen Reactions  . Penicillins Anaphylaxis, Hives, Shortness Of Breath, Swelling and Other (See Comments)    Angioedema    Patient Measurements: Height: '5\' 6"'  (167.6 cm) Weight: (!) 328 lb (148.78 kg) IBW/kg (Calculated) : 59.3   Vital Signs: Temp: 98.2 F (36.8 C) (04/24 0748) Temp Source: Oral (04/24 0748) BP: 130/65 mmHg (04/24 0800) Pulse Rate: 55 (04/24 0800) Intake/Output from previous day: 04/23 0701 - 04/24 0700 In: 2726.5 [I.V.:1045; NG/GT:60; IV Piggyback:250; TPN:1371.5] Out: 2960 [Urine:2535; Drains:250; Stool:175] Intake/Output from this shift: Total I/O In: 180 [I.V.:80; NG/GT:30; TPN:70] Out: 300 [Urine:300]  Labs:  Recent Labs  04/14/16 0405 04/15/16 0352 04/16/16 0335  WBC 10.4 11.2* 10.2  HGB 7.6* 7.5* 7.4*  HCT 24.5* 24.9* 24.2*  PLT 447* 440* 442*     Recent Labs  04/14/16 0405 04/15/16 0352 04/16/16 0335  NA 132* 135  133* 136  K 3.9 3.5  3.4* 3.3*  CL 92* 93*  93* 97*  CO2 30 32  33* 31  GLUCOSE 123* 102*  103* 107*  BUN 22* 24*  25* 24*  CREATININE 0.85 0.86  0.92 0.91  CALCIUM 8.1* 8.1*  8.1* 8.2*  MG 2.0  --  2.0  PHOS 3.8 3.5 3.6  PROT  --   --  6.2*  ALBUMIN 1.5* 1.5* 1.8*  AST  --   --  43*  ALT  --   --  20  ALKPHOS  --   --  69  BILITOT  --   --  0.2*  PREALBUMIN  --   --  16.1*  TRIG  --   --  128   Estimated Creatinine Clearance: 96.2 mL/min (by C-G formula based on Cr of 0.91).    Recent Labs  04/15/16 1150 04/15/16 2154 04/15/16 2342  GLUCAP 102* 110* 102*    Medications:  Scheduled:  . antiseptic oral rinse  7 mL Mouth Rinse 10 times per day  . arformoterol  15 mcg Nebulization BID  . budesonide (PULMICORT) nebulizer solution  0.25 mg Nebulization BID  . chlorhexidine gluconate (SAGE KIT)  15 mL Mouth Rinse BID  . enoxaparin (LOVENOX) injection  40 mg  Subcutaneous Q24H  . feeding supplement (PRO-STAT SUGAR FREE 64)  60 mL Per Tube BID  . feeding supplement (VITAL HIGH PROTEIN)  1,000 mL Per Tube Q24H  . furosemide  20 mg Intravenous BID  . levothyroxine  100 mcg Intravenous Daily  . pantoprazole (PROTONIX) IV  40 mg Intravenous QHS  . sodium chloride flush  10-40 mL Intracatheter Q12H    Insulin Requirements in the past 24 hours:  None  Admission: 50 YOF who was 2 weeks out from emergency colectomy/colostomy and discharged on 03/25/16. Noted to have feculent discharge from midline wound when having staples removed and diagnosed with necrotic stoma, peritonitis, and fascial necrosis. Pharmacy consulted to start TPN due to expected prolonged ileus.   Surgeries/Procedures: ~2 wks pta: emergency colectomy/colostomy (Hartman's) 4/6: colostomy removal and end stapling of stoma, partial colectomy, ex-lap, omentectomy, wound debridement 4/7: lap, VAC change, possible debridement 4/10: Re-exploration of abdomen, Wound VAC change 4/12: Re-exploration of abdomen, placement of abdominal wall closure device 4/13: Trach placed  4/14, 4/17: Wound VAC change  GI: prealbumin 3.2 > 13 > 16.1.  Drain with 249m out yesterday. VAC change 4/21.  PPI-IV. TF were resumed but patient had +N/V. TF have  been held but surgery not concerned for obstruction since ostomy has flatus and stool Endo: hypothyroid on IV Synthroid. Gout. No hx DM, cbgs controlled Lytes: K 3.3 (goal >/= 4 for ileus), MD ordered KCl x 2, Mg 2.CoCa 9.92 Renal: SCr stable, normalized CrCL 71 ml/min - UOP 1 ml/kg/hr (Lasix x2), NS at 10 ml/hr. Furosemide 60m IV twice daily  Pulm: COPD / asthma. Vent post-op, trached 4/13, FiO2 30%, no wean until abd closed. Brovana, Pulmicort Cards: HTN - BP soft on Levophedm also on Lasix Hepatobil: AST 24 > 43, ALT 10 > 20, Tbili wnl, TG 128 Neuro: Fentanyl/Versed gtts- weaning, sedation d/t open abd - GCS 12, CPOT 0, RASS at goal -2 ID: s/p 10d of  Cipro/Flagyl for intra-abd infxn. CoNS in urine and blood (1 of 2). Afebrile, WBC wnl this AM. New blood cx drawn 4/18, ngtd.   Best Practices: Lovenox, PPI IV, MC TPN Access: PICC 03/29/16 TPN start date: 03/29/16  Current Nutrition:  Vital HP @ 30 mL/hr  -- on hold Prostat 643mBID (30g and 200 kCal per serving)  -- on hold Clinimix E 5/15 at 70 mL/hr + 20% ILE at 10 mL/hr on M/W/F  Nutritional Goals: Per RD 4/15 1300-1477 kCal and 145-155 gm protein per day    Plan:  - Increase Clinimix E 5/15 to 83 ml/hr, this will provide 1414 kcal (100% of goal) and 100 g AA (69% of goal) - No IVFA day #1 in ICU -- will also allow [place] increase protein in TPN - F/u restart of TF - KCl x 4 runs - Daily multivitamin and trace elements in TPN - TPN labs Mon/Thurs    Al[place] BetterPharmD, BCPS Clinical Pharmacist 04/16/2016 8:37 AM

## 2016-04-16 NOTE — Progress Notes (Signed)
12 Days Post-Op  Subjective: Pt on trach and awake and responsive   Objective: Vital signs in last 24 hours: Temp:  [97.8 F (36.6 C)-98.5 F (36.9 C)] 98.2 F (36.8 C) (04/24 0748) Pulse Rate:  [45-70] 55 (04/24 0800) Resp:  [11-28] 13 (04/24 0800) BP: (100-132)/(45-76) 130/65 mmHg (04/24 0800) SpO2:  [94 %-100 %] 100 % (04/24 0848) Arterial Line BP: (105-154)/(49-67) 147/61 mmHg (04/24 0800) FiO2 (%):  [30 %] 30 % (04/24 0848) Weight:  [148.78 kg (328 lb)] 148.78 kg (328 lb) (04/24 0500) Last BM Date: 04/15/16  Intake/Output from previous day: 04/23 0701 - 04/24 0700 In: 2726.5 [I.V.:1045; NG/GT:60; IV Piggyback:250; TPN:1371.5] Out: 2960 [Urine:2535; Drains:250; Stool:175] Intake/Output this shift: Total I/O In: 180 [I.V.:80; NG/GT:30; TPN:70] Out: 300 [Urine:300]  Incision/Wound:see op note   Lab Results:   Recent Labs  04/15/16 0352 04/16/16 0335  WBC 11.2* 10.2  HGB 7.5* 7.4*  HCT 24.9* 24.2*  PLT 440* 442*   BMET  Recent Labs  04/15/16 0352 04/16/16 0335  NA 135  133* 136  K 3.5  3.4* 3.3*  CL 93*  93* 97*  CO2 32  33* 31  GLUCOSE 102*  103* 107*  BUN 24*  25* 24*  CREATININE 0.86  0.92 0.91  CALCIUM 8.1*  8.1* 8.2*   PT/INR No results for input(s): LABPROT, INR in the last 72 hours. ABG  Recent Labs  04/14/16 0345  PHART 7.480*  HCO3 35.7*    Studies/Results: Dg Chest Port 1 View  04/15/2016  CLINICAL DATA:  Endotracheal tube present EXAM: PORTABLE CHEST 1 VIEW COMPARISON:  04/11/2016 FINDINGS: Tracheostomy tube and feeding tube unchanged. RIGHT PICC line unchanged. Stable mildly enlarged cardiac silhouette. There is central venous congestion pattern unchanged. No consolidation or pneumothorax. IMPRESSION: 1. Stable support apparatus. 2. No interval change. 3. Central venous congestion. Electronically Signed   By: Suzy Bouchard M.D.   On: 04/15/2016 09:48   Dg Abd Portable 1v  04/15/2016  CLINICAL DATA:  Emesis 3 times last  night. EXAM: PORTABLE ABDOMEN - 1 VIEW COMPARISON:  Abdominal film 04/05/2016 FINDINGS: The feeding tube looped in the stomach with tip in the fundus of the stomach. No dilated loops of large or small bowel. Small amount of contrast in the rectum. IMPRESSION: Feeding tube looped in the stomach with tip in the gastric fundus Electronically Signed   By: Suzy Bouchard M.D.   On: 04/15/2016 11:00    Anti-infectives: Anti-infectives    Start     Dose/Rate Route Frequency Ordered Stop   04/03/16 2100  vancomycin (VANCOCIN) 1,500 mg in sodium chloride 0.9 % 500 mL IVPB  Status:  Discontinued     1,500 mg 250 mL/hr over 120 Minutes Intravenous Every 12 hours 04/02/16 2020 04/06/16 0943   04/02/16 2030  vancomycin (VANCOCIN) 2,500 mg in sodium chloride 0.9 % 500 mL IVPB     2,500 mg 250 mL/hr over 120 Minutes Intravenous  Once 04/02/16 2020 04/02/16 2306   03/29/16 1200  ciprofloxacin (CIPRO) IVPB 400 mg  Status:  Discontinued     400 mg 200 mL/hr over 60 Minutes Intravenous Every 12 hours 03/29/16 0213 04/06/16 0943   03/29/16 0600  [MAR Hold]  ciprofloxacin (CIPRO) IVPB 400 mg     (MAR Hold since 03/28/16 2357)   400 mg 200 mL/hr over 60 Minutes Intravenous On call to O.R. 03/28/16 2306 03/28/16 2355   03/29/16 0600  [MAR Hold]  metroNIDAZOLE (FLAGYL) IVPB 500 mg     (  MAR Hold since 03/28/16 2357)   500 mg 100 mL/hr over 60 Minutes Intravenous On call to O.R. 03/28/16 2306 03/29/16 0025   03/29/16 0600  metroNIDAZOLE (FLAGYL) IVPB 500 mg  Status:  Discontinued     500 mg 100 mL/hr over 60 Minutes Intravenous Every 8 hours 03/29/16 0213 04/06/16 0943   03/28/16 2300  ertapenem (INVANZ) 1 g in sodium chloride 0.9 % 50 mL IVPB  Status:  Discontinued     1 g 100 mL/hr over 30 Minutes Intravenous  Once 03/28/16 2259 03/28/16 2306      Assessment/Plan: s/p Procedure(s): EXPLORATORY LAPAROTOMY, PLACEMENT OF ABRA ABDOMINAL WALL CLOSURE SET  (N/A) Dressing change  See note   Plan on return to OR  wed for closure   LOS: 19 days    Christina Castaneda A. 04/16/2016

## 2016-04-16 NOTE — Progress Notes (Signed)
PULMONARY / CRITICAL CARE MEDICINE   Name: Christina Castaneda MRN: [id number] DOB: 1956-07-11    ADMISSION DATE:  03/28/2016 CONSULTATION DATE:  03/29/2016  REFERRING MD:  CCS  CHIEF COMPLAINT:  Colostomy bag leaking  60 yo female had diverticulitis with sigmoid colon resection, appendectomy at [place], [place] on 3/20 and required colostomy.Came home to [place] , [place]  Noted to have feculent material from midline wound and present to ER on 4/5. S/p laparotomy, colostomy removal and end stapling of stoma, partial colectomy, omentectomy, debridement of wound--Dr. Hulen Skains, subsequent ruq stoma and washout, subsequent abra placement by Dr Redmond Pulling  MICROBIOLOGY: Blood Ctx x1 4/9>>> Coag neg staph Urine Ctx 4/9>>> Coag neg staph Tracheal Asp Ctx 4/9>>> yeast Blood Ctx x2 4/18>>> negative Urine Ctx 4/5:  Multiple species present MRSA PCR 4/6:  Negative   ANTIBIOTICS: Cipro 4/5>>> off Flagyl 4/5>>>off Vanco 4/11 >>off  LINES/TUBES: ETT 4/6>>> 4/13 R PICC 4/6>>> L RADIAL ART LINE 4/7>>>4/15 #6 cuffed Trach [employer]) 4/13 >> Rt femoral aline 4/15 >>  SIGNIFICANT EVENTS: 3/20 Exp lap appendectomy,removal sigmoid colon, bowel repair in setting diverticulitis. [facility] 4/6 Exp Lap , colostomy removal, partial colectomy,omentectom, wound debridement, open wound with wound vac. 4/10 Start TPN 04/13/16 - Can lighten sedation  with open abdomen. Remains critically ill , intubated on  Low dose levo gtt .  on lower doses versed/ fent gtt  Afebrile   SUBJECTIVE/OVERNIGHT/INTERVAL HX Back on the vent for sedation for myofacial advancement.  VITAL SIGNS: BP 130/65 mmHg  Pulse 55  Temp(Src) 98.2 F (36.8 C) (Oral)  Resp 13  Ht 5\' 6"  (1.676 m)  Wt 148.78 kg (328 lb)  BMI 52.97 kg/m2  SpO2 100%  LMP 11/18/2012  HEMODYNAMICS: CVP:  [8 mmHg-12 mmHg] 8 mmHg  VENTILATOR SETTINGS: Vent Mode:  [-] PSV;CPAP FiO2 (%):  [30 %] 30 % Set Rate:  [16 bmp] 16 bmp Vt Set:  [550 mL] 550 mL PEEP:  [5 cmH20] 5 cmH20 Pressure  Support:  [10 cmH20] 10 cmH20 Plateau Pressure:  [18 cmH20-24 cmH20] 19 cmH20  INTAKE / OUTPUT: I/O last 3 completed shifts: In: 3904.1 [I.V.:1532.6; NG/GT:270; IV Piggyback:250] Out: [id number] [Urine:3570; Drains:450; Stool:525]  PHYSICAL EXAMINATION: General: sedated, acutely ill, obese Neuro: RASS 1 on fent gtt  HEENT: trach site clean Cardiac: regular Chest: decreased BS BL. Coarse. Sync with vent Abd: wound vac, abra closure device  Ext: 1+ edema Skin: no rashes   LABS:  PULMONARY  Recent Labs Lab 04/14/16 0345  PHART 7.480*  PCO2ART 47.8*  PO2ART 89.0  HCO3 35.7*  TCO2 37  O2SAT 97.0    CBC  Recent Labs Lab 04/14/16 0405 04/15/16 0352 04/16/16 0335  HGB 7.6* 7.5* 7.4*  HCT 24.5* 24.9* 24.2*  WBC 10.4 11.2* 10.2  PLT 447* 440* 442*    COAGULATION No results for input(s): INR in the last 168 hours.  CARDIAC  No results for input(s): TROPONINI in the last 168 hours. No results for input(s): PROBNP in the last 168 hours.   CHEMISTRY  Recent Labs Lab 04/10/16 0359 04/11/16 0340 04/12/16 0430 04/12/16 0431 04/13/16 0500 04/14/16 0405 04/15/16 0352 04/16/16 0335  NA  --  136  136 136 135 134* 132* 135  133* 136  K  --  4.0  4.0 4.0 4.0 4.0 3.9 3.5  3.4* 3.3*  CL  --  98*  98* 97* 96* 95* 92* 93*  93* 97*  CO2  --  30  30 30 30 31 30  32  33* 31  GLUCOSE  --  110*  111* 102* 104* 110* 123* 102*  103* 107*  BUN  --  21*  21* 20 20 21* 22* 24*  25* 24*  CREATININE  --  0.86  0.89 1.01* 0.91 0.87 0.85 0.86  0.92 0.91  CALCIUM  --  7.9*  7.9* 8.0* 7.9* 8.0* 8.1* 8.1*  8.1* 8.2*  MG 1.8 2.1 1.9  --   --  2.0  --  2.0  PHOS  --  3.9 4.0 4.0 3.8 3.8 3.5 3.6   Estimated Creatinine Clearance: 96.2 mL/min (by C-G formula based on Cr of 0.91).  LIVER  Recent Labs Lab 04/12/16 0430 04/12/16 0431 04/13/16 0500 04/14/16 0405 04/15/16 0352 04/16/16 0335  AST 24  --   --   --   --  43*  ALT 10*  --   --   --   --  20  ALKPHOS 64  --    --   --   --  69  BILITOT 0.3  --   --   --   --  0.2*  PROT 5.8*  --   --   --   --  6.2*  ALBUMIN 1.5* 1.5* 1.4* 1.5* 1.5* 1.8*   INFECTIOUS No results for input(s): LATICACIDVEN, PROCALCITON in the last 168 hours.  ENDOCRINE CBG (last 3)   Recent Labs  04/15/16 1150 04/15/16 2154 04/15/16 2342  GLUCAP 102* 110* 102*   IMAGING x48h  - image(s) personally visualized  -   highlighted in bold Dg Chest Port 1 View  04/15/2016  CLINICAL DATA:  Endotracheal tube present EXAM: PORTABLE CHEST 1 VIEW COMPARISON:  04/11/2016 FINDINGS: Tracheostomy tube and feeding tube unchanged. RIGHT PICC line unchanged. Stable mildly enlarged cardiac silhouette. There is central venous congestion pattern unchanged. No consolidation or pneumothorax. IMPRESSION: 1. Stable support apparatus. 2. No interval change. 3. Central venous congestion. Electronically Signed   By: Suzy Bouchard M.D.   On: 04/15/2016 09:48   Dg Abd Portable 1v  04/15/2016  CLINICAL DATA:  Emesis 3 times last night. EXAM: PORTABLE ABDOMEN - 1 VIEW COMPARISON:  Abdominal film 04/05/2016 FINDINGS: The feeding tube looped in the stomach with tip in the fundus of the stomach. No dilated loops of large or small bowel. Small amount of contrast in the rectum. IMPRESSION: Feeding tube looped in the stomach with tip in the gastric fundus Electronically Signed   By: Suzy Bouchard M.D.   On: 04/15/2016 11:00   ASSESSMENT / PLAN:  PULMONARY A: Acute resp failure , prolonged ventialtion  s/p tracheostomy. Hx of COPD with asthma, OSA >> wheezing noted 4/14. Tobacco abuse.   - on full vent support 40%  P:    SBTs with goal ATC daytime - ful vent support otherwise Cont to try to wean  Brovana, pulmicort with prn albuterol  CARDIOVASCULAR A:  Septic shock. Hx of HTN. Hypervolemia >> great response to lasix 4/15. Echo - nml LV fn, nml RV 4/23 >weaned off pressors , remains in neg bal ~5L   P:  Cont to monitor Cont lasix 20 mg IV  BID.  RENAL A:   AKI >> resolved. Hypokalemia. Hypomagnesemia. Hypokalemia  P:   Monitor renal fx, urine outpt Replace electrolytes as needed Lasix to 20 q 12h for equal balance K+ replaced 40 meq IV.  GASTROINTESTINAL A:   Peritonitis s/p laparotomy. Trickle tube feeds as tolerated per CCS >> held 4/23 due to  Vomiting>KUB w/ no  ileus  Protein calorie malnutrition  Morbid obesity P:   Post-op care, TPN per CCS,abra closure device being advanced last 4/19 Protonix for SUP Reinsert cortrak Restart TF once it is post pyloric.  HEMATOLOGIC A:   Anemia of critical illness. P:  F/u CBC Transfuse for Hb < 7 Lovenox for DVT prevention  INFECTIOUS A:   Septic shock 2nd to peritonitis and cellulitis of ostomy site. Coag neg Staph bacteremia, UTI. Hx of PCN allergy. P:   Cipro, flagyl for peritonitis- stopped Off vancomycin for Coag neg staph x 7ds, Rpt blood cx shows clearance   ENDOCRINE A:   Hx of hypothyroidism. P:   Synthroid IV  NEUROLOGIC A:   Acute metabolic encephalopathy.-improving  Bradycardia from diprivan. P:   RASS goal: 0  Versed prn Ct fent gtt at 400 mcg/hr. Add Fentanyl patch. Once able to start PO then will add methadone to decrease fentanyl drip.  The patient is critically ill with multiple organ systems failure and requires high complexity decision making for assessment and support, frequent evaluation and titration of therapies, application of advanced monitoring technologies and extensive interpretation of multiple databases.   Critical Care Time devoted to patient care services described in this note is  35  Minutes. This time reflects time of care of this signee Dr Jennet Maduro. This critical care time does not reflect procedure time, or teaching time or supervisory time of PA/NP/Med student/Med Resident etc but could involve care discussion time.  Rush Farmer, M.D. [facility] Pulmonary/Critical Care Medicine. Pager: [phone]. After  hours pager: [phone].  04/16/2016 9:52 AM

## 2016-04-16 NOTE — Op Note (Signed)
Preoperative diagnosis: open abdomen  Postoperative diagnosis: open abdomen   Procedure: myofascial advancement, vac change of 50-100 cm^2  Surgeon: Gurney Maxin, M.D.  Asst: Jomarie Longs, PA  Anesthesia: fentanyl and versed  Indications for procedure: Christina Castaneda is a 60 y.o. year old female with symptoms of dead colostomy, s/p resection with complication of open abdomen.  Description of procedure: WHO checklist was applied. The patient was prepped and draped in the usual sterile fashion. The previous vac was taken down. The fashion was measured at 4cm in the middle with the lower area of 6cm. The myofascial maneuver was performed. The bands were tightened to 2cm In the lower abdomen and 1.5cm around the ostomy. The myofascial maneuver was performed again. The fascial defect was 3.5cm in the middle with 6cm at the lower area. The vac was replaced in standard fashion with good suction  Findings: 4 cm  and 6cm defect  Specimen: none  Implant: vac  Blood loss: <20ml  Local anesthesia: 0 ml 0.25% marcaine with epinephrine  Complications: none   Pt much more awake and stable Closure planned for wed since she has good abdominal wall mobility during the "move"    High risk of abdominal wall problems no matter how she is closed but this may help get her up faster and hopefully shorten hospital stay  I see no further advantage leaving the ABRA device in place any longer

## 2016-04-17 ENCOUNTER — Inpatient Hospital Stay (HOSPITAL_COMMUNITY): Payer: BLUE CROSS/BLUE SHIELD

## 2016-04-17 LAB — BLOOD GAS, ARTERIAL
Acid-Base Excess: 5.1 mmol/L — ABNORMAL HIGH (ref 0.0–2.0)
Bicarbonate: 27.9 mEq/L — ABNORMAL HIGH (ref 20.0–24.0)
FIO2: 0.3
MECHVT: 550 mL
O2 Saturation: 99.2 %
PATIENT TEMPERATURE: 98.6
PCO2 ART: 32.2 mmHg — AB (ref 35.0–45.0)
PEEP: 5 cmH2O
PO2 ART: 129 mmHg — AB (ref 80.0–100.0)
RATE: 16 resp/min
TCO2: 28.9 mmol/L (ref 0–100)
pH, Arterial: 7.547 — ABNORMAL HIGH (ref 7.350–7.450)

## 2016-04-17 LAB — RENAL FUNCTION PANEL
ALBUMIN: 1.7 g/dL — AB (ref 3.5–5.0)
Anion gap: 11 (ref 5–15)
BUN: 23 mg/dL — AB (ref 6–20)
CHLORIDE: 93 mmol/L — AB (ref 101–111)
CO2: 27 mmol/L (ref 22–32)
CREATININE: 0.84 mg/dL (ref 0.44–1.00)
Calcium: 7.8 mg/dL — ABNORMAL LOW (ref 8.9–10.3)
GFR calc Af Amer: >60 mL/min (ref >60)
GLUCOSE: 108 mg/dL — AB (ref 65–99)
PHOSPHORUS: 4 mg/dL (ref 2.5–4.6)
Potassium: 3.4 mmol/L — ABNORMAL LOW (ref 3.5–5.1)
Sodium: 131 mmol/L — ABNORMAL LOW (ref 135–145)

## 2016-04-17 LAB — CBC WITH DIFFERENTIAL/PLATELET
Basophils Absolute: 0 10*3/uL (ref 0.0–0.1)
Basophils Relative: 0 %
EOS ABS: 0.1 10*3/uL (ref 0.0–0.7)
Eosinophils Relative: 1 %
HEMATOCRIT: 25.7 % — AB (ref 36.0–46.0)
HEMOGLOBIN: 7.2 g/dL — AB (ref 12.0–15.0)
LYMPHS ABS: 1.5 10*3/uL (ref 0.7–4.0)
Lymphocytes Relative: 16 %
MCH: 28.1 pg (ref 26.0–34.0)
MCHC: 28 g/dL — ABNORMAL LOW (ref 30.0–36.0)
MCV: 100.4 fL — ABNORMAL HIGH (ref 78.0–100.0)
Monocytes Absolute: 0.7 10*3/uL (ref 0.1–1.0)
Monocytes Relative: 7 %
NEUTROS ABS: 6.8 10*3/uL (ref 1.7–7.7)
NEUTROS PCT: 75 %
Platelets: 428 10*3/uL — ABNORMAL HIGH (ref 150–400)
RBC: 2.56 MIL/uL — AB (ref 3.87–5.11)
RDW: 18.4 % — ABNORMAL HIGH (ref 11.5–15.5)
WBC: 9.2 10*3/uL (ref 4.0–10.5)

## 2016-04-17 LAB — BASIC METABOLIC PANEL
Anion gap: 8 (ref 5–15)
BUN: 21 mg/dL — AB (ref 6–20)
CHLORIDE: 88 mmol/L — AB (ref 101–111)
CO2: 27 mmol/L (ref 22–32)
Calcium: 7.9 mg/dL — ABNORMAL LOW (ref 8.9–10.3)
Creatinine, Ser: 0.95 mg/dL (ref 0.44–1.00)
GFR calc non Af Amer: >60 mL/min (ref >60)
Glucose, Bld: 1209 mg/dL (ref 65–99)
POTASSIUM: 6 mmol/L — AB (ref 3.5–5.1)
SODIUM: 123 mmol/L — AB (ref 135–145)

## 2016-04-17 LAB — MAGNESIUM: MAGNESIUM: 2.4 mg/dL (ref 1.7–2.4)

## 2016-04-17 LAB — GLUCOSE, CAPILLARY
GLUCOSE-CAPILLARY: 89 mg/dL (ref 65–99)
GLUCOSE-CAPILLARY: 99 mg/dL (ref 65–99)
Glucose-Capillary: 101 mg/dL — ABNORMAL HIGH (ref 65–99)

## 2016-04-17 LAB — PHOSPHORUS: Phosphorus: 8.6 mg/dL — ABNORMAL HIGH (ref 2.5–4.6)

## 2016-04-17 MED ORDER — TRACE MINERALS CR-CU-MN-SE-ZN 10-1000-500-60 MCG/ML IV SOLN
INTRAVENOUS | Status: AC
  Administered 2016-04-17: 18:00:00 via INTRAVENOUS
  Filled 2016-04-17: qty 1992

## 2016-04-17 MED ORDER — CIPROFLOXACIN IN D5W 400 MG/200ML IV SOLN
400.0000 mg | INTRAVENOUS | Status: DC
  Filled 2016-04-17: qty 200

## 2016-04-17 MED ORDER — POTASSIUM CHLORIDE 10 MEQ/50ML IV SOLN
10.0000 meq | INTRAVENOUS | Status: AC
  Administered 2016-04-17 (×3): 10 meq via INTRAVENOUS
  Filled 2016-04-17 (×3): qty 50

## 2016-04-17 NOTE — Progress Notes (Signed)
PULMONARY / CRITICAL CARE MEDICINE   Name: Christina Castaneda MRN: [id number] DOB: Oct 31, 1956    ADMISSION DATE:  03/28/2016 CONSULTATION DATE:  03/29/2016  REFERRING MD:  CCS  CHIEF COMPLAINT:  Colostomy bag leaking  60 yo female had diverticulitis with sigmoid colon resection, appendectomy at [facility] [place] on 3/20 and required colostomy.Came home to [place] , [place]  Noted to have feculent material from midline wound and present to ER on 4/5. S/p laparotomy, colostomy removal and end stapling of stoma, partial colectomy, omentectomy, debridement of wound--Dr. Hulen Skains, subsequent ruq stoma and washout, subsequent abra placement by Dr Redmond Pulling  MICROBIOLOGY: Blood Ctx x1 4/9>>> Coag neg staph Urine Ctx 4/9>>> Coag neg staph Tracheal Asp Ctx 4/9>>> yeast Blood Ctx x2 4/18>>> negative Urine Ctx 4/5:  Multiple species present MRSA PCR 4/6:  Negative   ANTIBIOTICS: Cipro 4/5>>> off Flagyl 4/5>>>off Vanco 4/11 >>off  LINES/TUBES: ETT 4/6>>> 4/13 R PICC 4/6>>> L RADIAL ART LINE 4/7>>>4/15 #6 cuffed Trach [employer]) 4/13 >> Rt femoral aline 4/15 >>  SIGNIFICANT EVENTS: 3/20 Exp lap appendectomy,removal sigmoid colon, bowel repair in setting diverticulitis. [facility] 4/6 Exp Lap , colostomy removal, partial colectomy,omentectom, wound debridement, open wound with wound vac. 4/10 Start TPN 04/13/16 - Can lighten sedation  with open abdomen. Remains critically ill , intubated on  Low dose levo gtt .  on lower doses versed/ fent gtt  Afebrile   SUBJECTIVE/OVERNIGHT/INTERVAL HX No events overnight, tolerating TC a little better this AM.  VITAL SIGNS: BP 132/71 mmHg  Pulse 64  Temp(Src) 98.6 F (37 C) (Oral)  Resp 11  Ht 5\' 6"  (1.676 m)  Wt 147.419 kg (325 lb)  BMI 52.48 kg/m2  SpO2 99%  LMP 11/18/2012  HEMODYNAMICS: CVP:  [7 mmHg-10 mmHg] 7 mmHg  VENTILATOR SETTINGS: Vent Mode:  [-] PSV;CPAP FiO2 (%):  [30 %] 30 % Set Rate:  [16 bmp] 16 bmp Vt Set:  [550 mL] 550 mL PEEP:  [5 cmH20] 5 cmH20 Pressure  Support:  [5 cmH20] 5 cmH20 Plateau Pressure:  [18 cmH20-21 cmH20] 18 cmH20  INTAKE / OUTPUT: I/O last 3 completed shifts: In: 4531.3 [I.V.:1485.8; NG/GT:145; IV Piggyback:200] Out: 4880 [Urine:4420; Drains:425; Stool:35]  PHYSICAL EXAMINATION: General: Alert and interactive, obese Neuro: RASS 1 on fent gtt  HEENT: trach site clean Cardiac: regular Chest: decreased BS BL. Coarse. Sync with vent Abd: wound vac, abra closure device  Ext: 1+ edema Skin: no rashes   LABS:  PULMONARY  Recent Labs Lab 04/14/16 0345 04/17/16 0600  PHART 7.480* 7.547*  PCO2ART 47.8* 32.2*  PO2ART 89.0 129*  HCO3 35.7* 27.9*  TCO2 37 28.9  O2SAT 97.0 99.2    CBC  Recent Labs Lab 04/15/16 0352 04/16/16 0335 04/17/16 0532  HGB 7.5* 7.4* 7.2*  HCT 24.9* 24.2* 25.7*  WBC 11.2* 10.2 9.2  PLT 440* 442* 428*    COAGULATION No results for input(s): INR in the last 168 hours.  CARDIAC  No results for input(s): TROPONINI in the last 168 hours. No results for input(s): PROBNP in the last 168 hours.   CHEMISTRY  Recent Labs Lab 04/11/16 0340 04/12/16 0430  04/14/16 0405 04/15/16 0352 04/16/16 0335 04/17/16 0532 04/17/16 0637  NA 136  136 136  < > 132* 135  133* 136 123* 131*  K 4.0  4.0 4.0  < > 3.9 3.5  3.4* 3.3* 6.0* 3.4*  CL 98*  98* 97*  < > 92* 93*  93* 97* 88* 93*  CO2 30  30 30   < >  30 32  33* 31 27 27   GLUCOSE 110*  111* 102*  < > 123* 102*  103* 107* 1209* 108*  BUN 21*  21* 20  < > 22* 24*  25* 24* 21* 23*  CREATININE 0.86  0.89 1.01*  < > 0.85 0.86  0.92 0.91 0.95 0.84  CALCIUM 7.9*  7.9* 8.0*  < > 8.1* 8.1*  8.1* 8.2* 7.9* 7.8*  MG 2.1 1.9  --  2.0  --  2.0 2.4  --   PHOS 3.9 4.0  < > 3.8 3.5 3.6 8.6* 4.0  < > = values in this interval not displayed. Estimated Creatinine Clearance: 103.6 mL/min (by C-G formula based on Cr of 0.84).  LIVER  Recent Labs Lab 04/12/16 0430  04/13/16 0500 04/14/16 0405 04/15/16 0352 04/16/16 0335 04/17/16 0637   AST 24  --   --   --   --  43*  --   ALT 10*  --   --   --   --  20  --   ALKPHOS 64  --   --   --   --  69  --   BILITOT 0.3  --   --   --   --  0.2*  --   PROT 5.8*  --   --   --   --  6.2*  --   ALBUMIN 1.5*  < > 1.4* 1.5* 1.5* 1.8* 1.7*  < > = values in this interval not displayed. INFECTIOUS No results for input(s): LATICACIDVEN, PROCALCITON in the last 168 hours.  ENDOCRINE CBG (last 3)   Recent Labs  04/15/16 2342 04/16/16 1156 04/16/16 2346  GLUCAP 102* 89 107*   IMAGING x48h  - image(s) personally visualized  -   highlighted in bold Dg Chest Port 1 View  04/17/2016  CLINICAL DATA:  Respiratory difficulty EXAM: PORTABLE CHEST 1 VIEW COMPARISON:  04/15/2016 FINDINGS: Tracheostomy tube, feeding tube, right arm PICC are stable. Lungs are under aerated and grossly clear. Upper normal heart size. No pneumothorax. IMPRESSION: Stable exam. Low volumes without evidence of consolidation or pulmonary edema. Electronically Signed   By: Marybelle Killings M.D.   On: 04/17/2016 07:40   ASSESSMENT / PLAN:  PULMONARY A: Acute resp failure , prolonged ventialtion  s/p tracheostomy. Hx of COPD with asthma, OSA >> wheezing noted 4/14. Tobacco abuse.   - on full vent support 40%  P:   PS but no TC since going to the OR in AM, anticipate will advance to TC quickly, Tv of 1.2L on 5/5. Titrate O2 for sat of 88-92%. Brovana, pulmicort with prn albuterol  CARDIOVASCULAR A:  Septic shock. Hx of HTN. Hypervolemia >> great response to lasix 4/15. Echo - nml LV fn, nml RV 4/23 >weaned off pressors , remains in neg bal ~5L   P:  Cont to monitor Cont lasix 20 mg IV BID.  RENAL A:   AKI >> resolved. Hypokalemia. Hypomagnesemia. Hypokalemia  P:   Monitor renal fx, urine outpt Replace electrolytes as needed Lasix to 20 q 12h for equal balance. Goal is fluid even at this point.  GASTROINTESTINAL A:   Peritonitis s/p laparotomy. Trickle tube feeds as tolerated per CCS >> held 4/23  due to  Vomiting>KUB w/ no ileus  Protein calorie malnutrition  Morbid obesity P:   Post-op care, TPN per CCS, abra closure device being advanced, anticipate closure in the OR in AM 4/26. Protonix for SUP NPO for OR.  HEMATOLOGIC A:  Anemia of critical illness. P:  F/u CBC. Transfuse for Hb < 7. Lovenox for DVT prevention.  INFECTIOUS A:   Septic shock 2nd to peritonitis and cellulitis of ostomy site. Coag neg Staph bacteremia, UTI. Hx of PCN allergy. P:   Cipro, flagyl for peritonitis- stopped Cipro restarted by CCS. Off vancomycin for Coag neg staph x 7ds, Rpt blood cx shows clearance   ENDOCRINE A:   Hx of hypothyroidism. P:   Synthroid IV  NEUROLOGIC A:   Acute metabolic encephalopathy.-improving  Bradycardia from diprivan. P:   RASS goal: 0  Versed prn Ct fent gtt down to 250 mcg/hr after initiation of fentanyl patch at 100. Once able to start PO then will add methadone to decrease fentanyl drip.  The patient is critically ill with multiple organ systems failure and requires high complexity decision making for assessment and support, frequent evaluation and titration of therapies, application of advanced monitoring technologies and extensive interpretation of multiple databases.   Critical Care Time devoted to patient care services described in this note is  35  Minutes. This time reflects time of care of this signee Dr Jennet Maduro. This critical care time does not reflect procedure time, or teaching time or supervisory time of PA/NP/Med student/Med Resident etc but could involve care discussion time.  Rush Farmer, M.D. [facility] Pulmonary/Critical Care Medicine. Pager: [phone]. After hours pager: [phone].  04/17/2016 11:15 AM

## 2016-04-17 NOTE — Progress Notes (Signed)
[place] NOTE  Pharmacy Consult for TPN Indication: Intolerance to EN (expected to prolong ileus)  Allergies  Allergen Reactions  . Penicillins Anaphylaxis, Hives, Shortness Of Breath, Swelling and Other (See Comments)    Angioedema    Patient Measurements: Height: '5\' 6"'  (167.6 cm) Weight: (!) 325 lb (147.419 kg) IBW/kg (Calculated) : 59.3   Vital Signs: Temp: 98.6 F (37 C) (04/25 0727) Temp Source: Oral (04/25 0727) BP: 145/75 mmHg (04/25 0700) Pulse Rate: 69 (04/25 0700) Intake/Output from previous day: 04/24 0701 - 04/25 0700 In: 3031.3 [I.V.:925.8; NG/GT:145; IV Piggyback:100; TPN:1860.5] Out: 3560 [Urine:3225; Drains:325; Stool:10] Intake/Output from this shift:    Labs:  Recent Labs  04/15/16 0352 04/16/16 0335 04/17/16 0532  WBC 11.2* 10.2 9.2  HGB 7.5* 7.4* 7.2*  HCT 24.9* 24.2* 25.7*  PLT 440* 442* 428*     Recent Labs  04/15/16 0352 04/16/16 0335 04/17/16 0532 04/17/16 0637  NA 135  133* 136 123* 131*  K 3.5  3.4* 3.3* 6.0* 3.4*  CL 93*  93* 97* 88* 93*  CO2 32  33* '31 27 27  ' GLUCOSE 102*  103* 107* 1209* 108*  BUN 24*  25* 24* 21* 23*  CREATININE 0.86  0.92 0.91 0.95 0.84  CALCIUM 8.1*  8.1* 8.2* 7.9* 7.8*  MG  --  2.0 2.4  --   PHOS 3.5 3.6 8.6* 4.0  PROT  --  6.2*  --   --   ALBUMIN 1.5* 1.8*  --  1.7*  AST  --  43*  --   --   ALT  --  20  --   --   ALKPHOS  --  69  --   --   BILITOT  --  0.2*  --   --   PREALBUMIN  --  16.1*  --   --   TRIG  --  128  --   --    Estimated Creatinine Clearance: 103.6 mL/min (by C-G formula based on Cr of 0.84).    Recent Labs  04/15/16 2342 04/16/16 1156 04/16/16 2346  GLUCAP 102* 89 107*    Medications:  Scheduled:  . antiseptic oral rinse  7 mL Mouth Rinse 10 times per day  . arformoterol  15 mcg Nebulization BID  . budesonide (PULMICORT) nebulizer solution  0.25 mg Nebulization BID  . chlorhexidine gluconate (SAGE KIT)  15 mL Mouth Rinse BID  . enoxaparin  (LOVENOX) injection  40 mg Subcutaneous Q24H  . feeding supplement (VITAL HIGH PROTEIN)  1,000 mL Per Tube Q24H  . fentaNYL  100 mcg Transdermal Q72H  . furosemide  20 mg Intravenous BID  . levothyroxine  100 mcg Intravenous Daily  . pantoprazole (PROTONIX) IV  40 mg Intravenous QHS  . potassium chloride  10 mEq Intravenous Q1 Hr x 3  . sodium chloride flush  10-40 mL Intracatheter Q12H    Insulin Requirements in the past 24 hours:  None  Admission: 62 YOF who was 2 weeks out from emergency colectomy/colostomy and discharged on 03/25/16. Noted to have feculent discharge from midline wound when having staples removed and diagnosed with necrotic stoma, peritonitis, and fascial necrosis. Pharmacy consulted to start TPN due to expected prolonged ileus.   Surgeries/Procedures: ~2 wks pta: emergency colectomy/colostomy (Hartman's) 4/6: colostomy removal and end stapling of stoma, partial colectomy, ex-lap, omentectomy, wound debridement 4/7: lap, VAC change, possible debridement 4/10: Re-exploration of abdomen, Wound VAC change 4/12: Re-exploration of abdomen, placement of abdominal wall closure device 4/13:  Trach placed  4/14, 4/17: Wound VAC change 4/24: Wound vac change 4/145 OR tomorrow for wound vac closure  GI: prealbumin 3.2 > 13 > 16.1.  Drain with 234m out yesterday. VAC change 4/21.  PPI-IV. TF were resumed but patient had +N/V. TF have been held but surgery not concerned for obstruction since ostomy has flatus and stool Endo: hypothyroid on IV Synthroid. Gout. No hx DM, cbgs controlled Lytes: K 3.4 -KCl x3 ordered per MD, Mg 2.CoCa 9.92 Renal: SCr stable4, normalized CrCL 71 ml/min - UOP 1 ml/kg/hr (Lasix x2), NS at 10 ml/hr. Furosemide 260mIV twice daily  Pulm: COPD / asthma. Vent post-op, trached 4/13, FiO2 30%, no wean until abd closed. Brovana, Pulmicort Cards: HTN - BP soft on Levophedm also on Lasix Hepatobil: AST 24 > 43, ALT 10 > 20, Tbili wnl, TG 128 Neuro:  Fentanyl/Versed gtts- weaning, sedation d/t open abd - GCS 12, CPOT 0, RASS at goal -2 ID: s/p 10d of Cipro/Flagyl for intra-abd infxn. CoNS in urine and blood (1 of 2). Afebrile, WBC wnl this AM. New blood cx drawn 4/18, ngtd.   Best Practices: Lovenox, PPI IV, MC TPN Access: PICC 03/29/16 TPN start date: 03/29/16  Current Nutrition:  Vital HP @ 30 mL/hr  -- on hold Prostat 6073mID (30g and 200 kCal per serving)  -- on hold Clinimix E 5/15 at 83 mL/hr   Nutritional Goals: Per RD 4/19 1300-1475 kCal and 145-155 gm protein per day    Plan:  - Continue Clinimix E 5/15 at 83 ml/hr, this will provide 1414 kcal (100% of goal) and 100 g AA (69% of goal) - No IVFA day #2 in ICU -- will also allow us [place] increase protein in TPN - F/u restart of TF - Daily multivitamin and trace elements in TPN - TPN labs Mon/Thurs    AliHughes BetterharmD, BCPS Clinical Pharmacist 04/17/2016 9:03 AM

## 2016-04-17 NOTE — Plan of Care (Signed)
Problem: Pain Managment: Goal: General experience of comfort will improve Outcome: Progressing Patient received IV fentanyl 50 mcg bolus x 3 for breakthrough pain. Patient appeared comfortable and verbalized pain relief to a tolerable level. Fentanyl has been infusing at 250 mcg/hr and the patient was wide awake and alert. Patient had a bath and slept intermittently. Suctioned the trach and had 100 cc output (cream colored) during my shift.

## 2016-04-17 NOTE — Progress Notes (Signed)
13 Days Post-Op  Subjective: Pt awake and alert  Objective: Vital signs in last 24 hours: Temp:  [98.4 F (36.9 C)-98.8 F (37.1 C)] 98.6 F (37 C) (04/25 0727) Pulse Rate:  [52-73] 69 (04/25 0700) Resp:  [8-19] 14 (04/25 0700) BP: (107-155)/(54-87) 145/75 mmHg (04/25 0700) SpO2:  [93 %-100 %] 98 % (04/25 0757) Arterial Line BP: (127-158)/(54-69) 151/62 mmHg (04/25 0700) FiO2 (%):  [30 %] 30 % (04/25 0757) Weight:  [147.419 kg (325 lb)] 147.419 kg (325 lb) (04/25 0415) Last BM Date: 04/17/16  Intake/Output from previous day: 04/24 0701 - 04/25 0700 In: 3031.3 [I.V.:925.8; NG/GT:145; IV Piggyback:100; TPN:1860.5] Out: 3560 [Urine:3225; Drains:325; Stool:10] Intake/Output this shift:    Incision/Wound:dressing in place suction intact Will need move later this am   Lab Results:   Recent Labs  04/16/16 0335 04/17/16 0532  WBC 10.2 9.2  HGB 7.4* 7.2*  HCT 24.2* 25.7*  PLT 442* 428*   BMET  Recent Labs  04/17/16 0532 04/17/16 0637  NA 123* 131*  K 6.0* 3.4*  CL 88* 93*  CO2 27 27  GLUCOSE 1209* 108*  BUN 21* 23*  CREATININE 0.95 0.84  CALCIUM 7.9* 7.8*   PT/INR No results for input(s): LABPROT, INR in the last 72 hours. ABG  Recent Labs  04/17/16 0600  PHART 7.547*  HCO3 27.9*    Studies/Results: Dg Chest Port 1 View  04/17/2016  CLINICAL DATA:  Respiratory difficulty EXAM: PORTABLE CHEST 1 VIEW COMPARISON:  04/15/2016 FINDINGS: Tracheostomy tube, feeding tube, right arm PICC are stable. Lungs are under aerated and grossly clear. Upper normal heart size. No pneumothorax. IMPRESSION: Stable exam. Low volumes without evidence of consolidation or pulmonary edema. Electronically Signed   By: Marybelle Killings M.D.   On: 04/17/2016 07:40   Dg Abd Portable 1v  04/15/2016  CLINICAL DATA:  Emesis 3 times last night. EXAM: PORTABLE ABDOMEN - 1 VIEW COMPARISON:  Abdominal film 04/05/2016 FINDINGS: The feeding tube looped in the stomach with tip in the fundus of the  stomach. No dilated loops of large or small bowel. Small amount of contrast in the rectum. IMPRESSION: Feeding tube looped in the stomach with tip in the gastric fundus Electronically Signed   By: Suzy Bouchard M.D.   On: 04/15/2016 11:00    Anti-infectives: Anti-infectives    Start     Dose/Rate Route Frequency Ordered Stop   04/03/16 2100  vancomycin (VANCOCIN) 1,500 mg in sodium chloride 0.9 % 500 mL IVPB  Status:  Discontinued     1,500 mg 250 mL/hr over 120 Minutes Intravenous Every 12 hours 04/02/16 2020 04/06/16 0943   04/02/16 2030  vancomycin (VANCOCIN) 2,500 mg in sodium chloride 0.9 % 500 mL IVPB     2,500 mg 250 mL/hr over 120 Minutes Intravenous  Once 04/02/16 2020 04/02/16 2306   03/29/16 1200  ciprofloxacin (CIPRO) IVPB 400 mg  Status:  Discontinued     400 mg 200 mL/hr over 60 Minutes Intravenous Every 12 hours 03/29/16 0213 04/06/16 0943   03/29/16 0600  [MAR Hold]  ciprofloxacin (CIPRO) IVPB 400 mg     (MAR Hold since 03/28/16 2357)   400 mg 200 mL/hr over 60 Minutes Intravenous On call to O.R. 03/28/16 2306 03/28/16 2355   03/29/16 0600  [MAR Hold]  metroNIDAZOLE (FLAGYL) IVPB 500 mg     (MAR Hold since 03/28/16 2357)   500 mg 100 mL/hr over 60 Minutes Intravenous On call to O.R. 03/28/16 2306 03/29/16 0025  03/29/16 0600  metroNIDAZOLE (FLAGYL) IVPB 500 mg  Status:  Discontinued     500 mg 100 mL/hr over 60 Minutes Intravenous Every 8 hours 03/29/16 0213 04/06/16 0943   03/28/16 2300  ertapenem (INVANZ) 1 g in sodium chloride 0.9 % 50 mL IVPB  Status:  Discontinued     1 g 100 mL/hr over 30 Minutes Intravenous  Once 03/28/16 2259 03/28/16 2306      Assessment/Plan: s/p Procedure(s): EXPLORATORY LAPAROTOMY, PLACEMENT OF ABRA ABDOMINAL WALL CLOSURE SET  (N/A) Patient Active Problem List   Diagnosis Date Noted  . Acute respiratory failure ([place])   . Pressure ulcer 04/01/2016  . 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 Hypothyroid 03/29/2016  . Morbid obesity ([place]) 03/29/2016  . Open wound of abdomen 03/29/2016  . 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 Obesity 11/12/2012    Pt doing well Will plan on closing her wed in OR Discussed with patient and husband  The procedure has been discussed with the patient.  Alternative therapies have been discussed with the patient.  Operative risks include bleeding,  Infection,  Organ injury,  Nerve injury,  Blood vessel injury,  DVT,  Pulmonary embolism,  Death,  And possible reoperation.  Medical management risks include worsening of present situation.  The success of the procedure is 50 -90 % at treating patients symptoms.  The patient understands and agrees to proceed.  LOS: 20 days    Jeffory Snelgrove A. 04/17/2016

## 2016-04-17 NOTE — Progress Notes (Signed)
Nutrition Follow Up  DOCUMENTATION CODES:   Morbid obesity  INTERVENTION:    TPN per pharmacy  NUTRITION DIAGNOSIS:   Inadequate oral intake related to inability to eat as evidenced by NPO status, ongoing  GOAL:   Provide needs based on ASPEN/SCCM guidelines, progressing  MONITOR:   Vent status, Labs, Weight trends, Skin, I & O's (TPN prescription)  ASSESSMENT:   60 y.o. Female presented with postoperative complication. 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 She went to a nearby hospital and was diagnosed with perforated diverticulitis. She underwent an exploratory laparotomy with sigmoid colon resection on March 20. She was discharged from the hospital there after a week and just returned home to [place] 5 days ago. Yesterday she reports developing leakage of fecal matter from her midline surgical incision site. She continues to have normal ostomy output from her left lower quadrant stoma. Over the last 24 hours she has also experienced increased generalized abdominal pain.  Patient s/p procedure 4/6: COLOSTOMY REMOVAL AND END STAPLING OF STOMA PARTIAL COLECTOMY EXPLORATORY LAPAROTOMY OMENTECTOMY DEBRIDEMENT WOUND  Patient is currently intubated on ventilator support Temp (24hrs), Avg:98.6 F (37 C), Min:98.4 F (36.9 C), Max:98.8 F (37.1 C)   Vital HP formula started 4/13 and stopped 4/15 due to pt vomiting.   CORTRAK small bore feeding tube remains in place. Tube tip was unable to be advanced post-pyloric per IR.  Patient is receiving TPN with Clinimix E 5/15 @ 83 ml/hr.  No ILE at this time.  Provides 1414 kcals and 100 gm protein per day.  Meets 100% minimum estimated energy needs and 69% minimum estimated protein needs.  Surgery note reviewed.  Plan is for abdominal closure tomorrow.  Diet Order:  Diet NPO time specified .TPN (CLINIMIX-E) Adult Diet NPO time specified .TPN  (CLINIMIX-E) Adult  Skin:  open abdomen with ABRA and VAC in place  Last BM:  N/A  Height:   Ht Readings from Last 1 Encounters:  04/13/16 5\' 6"  (1.676 m)    Weight:   Wt Readings from Last 1 Encounters:  04/17/16 325 lb (147.419 kg)    Ideal Body Weight:  59 kg  BMI:  Body mass index is 52.48 kg/(m^2).  Estimated Nutritional Needs:   Kcal:  [id number]  Protein:  145-155 gm  Fluid:  per MD  EDUCATION NEEDS:   No education needs identified at this time  Arthur Holms, RD, LDN Pager #: [phone] After-Hours Pager #: [phone]

## 2016-04-18 ENCOUNTER — Inpatient Hospital Stay (HOSPITAL_COMMUNITY): Payer: BLUE CROSS/BLUE SHIELD | Admitting: Certified Registered Nurse Anesthetist

## 2016-04-18 ENCOUNTER — Encounter (HOSPITAL_COMMUNITY): Admission: EM | Disposition: A | Discharge status: Rehabilitation Facility | Payer: Self-pay | Source: Home / Self Care

## 2016-04-18 HISTORY — PX: LAPAROTOMY: SHX154

## 2016-04-18 HISTORY — PX: ABDOMINAL WALL DEFECT REPAIR: SHX53

## 2016-04-18 LAB — BASIC METABOLIC PANEL
Anion gap: 8 (ref 5–15)
BUN: 23 mg/dL — AB (ref 6–20)
CHLORIDE: 97 mmol/L — AB (ref 101–111)
CO2: 28 mmol/L (ref 22–32)
CREATININE: 0.71 mg/dL (ref 0.44–1.00)
Calcium: 8 mg/dL — ABNORMAL LOW (ref 8.9–10.3)
GFR calc non Af Amer: >60 mL/min (ref >60)
Glucose, Bld: 107 mg/dL — ABNORMAL HIGH (ref 65–99)
Potassium: 3.8 mmol/L (ref 3.5–5.1)
Sodium: 133 mmol/L — ABNORMAL LOW (ref 135–145)

## 2016-04-18 LAB — CBC WITH DIFFERENTIAL/PLATELET
Basophils Absolute: 0 10*3/uL (ref 0.0–0.1)
Basophils Relative: 0 %
Eosinophils Absolute: 0.2 10*3/uL (ref 0.0–0.7)
Eosinophils Relative: 2 %
HCT: 26.2 % — ABNORMAL LOW (ref 36.0–46.0)
Hemoglobin: 8.2 g/dL — ABNORMAL LOW (ref 12.0–15.0)
Lymphocytes Relative: 19 %
Lymphs Abs: 1.7 10*3/uL (ref 0.7–4.0)
MCH: 29.1 pg (ref 26.0–34.0)
MCHC: 31.3 g/dL (ref 30.0–36.0)
MCV: 92.9 fL (ref 78.0–100.0)
Monocytes Absolute: 0.8 10*3/uL (ref 0.1–1.0)
Monocytes Relative: 9 %
Neutro Abs: 6.6 10*3/uL (ref 1.7–7.7)
Neutrophils Relative %: 71 %
Platelets: 484 10*3/uL — ABNORMAL HIGH (ref 150–400)
RBC: 2.82 MIL/uL — ABNORMAL LOW (ref 3.87–5.11)
RDW: 17.5 % — ABNORMAL HIGH (ref 11.5–15.5)
WBC: 9.3 10*3/uL (ref 4.0–10.5)

## 2016-04-18 LAB — RENAL FUNCTION PANEL
Albumin: 1.8 g/dL — ABNORMAL LOW (ref 3.5–5.0)
Anion gap: 8 (ref 5–15)
BUN: 23 mg/dL — ABNORMAL HIGH (ref 6–20)
CO2: 28 mmol/L (ref 22–32)
Calcium: 8 mg/dL — ABNORMAL LOW (ref 8.9–10.3)
Chloride: 97 mmol/L — ABNORMAL LOW (ref 101–111)
Creatinine, Ser: 0.76 mg/dL (ref 0.44–1.00)
GFR calc Af Amer: >60 mL/min (ref >60)
GFR calc non Af Amer: >60 mL/min (ref >60)
Glucose, Bld: 110 mg/dL — ABNORMAL HIGH (ref 65–99)
Phosphorus: 4 mg/dL (ref 2.5–4.6)
Potassium: 3.8 mmol/L (ref 3.5–5.1)
Sodium: 133 mmol/L — ABNORMAL LOW (ref 135–145)

## 2016-04-18 LAB — PHOSPHORUS: PHOSPHORUS: 4.1 mg/dL (ref 2.5–4.6)

## 2016-04-18 LAB — GLUCOSE, CAPILLARY
GLUCOSE-CAPILLARY: 85 mg/dL (ref 65–99)
Glucose-Capillary: 97 mg/dL (ref 65–99)

## 2016-04-18 LAB — MAGNESIUM: Magnesium: 2.2 mg/dL (ref 1.7–2.4)

## 2016-04-18 SURGERY — LAPAROTOMY, EXPLORATORY
Anesthesia: General

## 2016-04-18 MED ORDER — PHENYLEPHRINE HCL 10 MG/ML IJ SOLN
INTRAMUSCULAR | Status: DC | PRN
  Administered 2016-04-18: 120 ug via INTRAVENOUS

## 2016-04-18 MED ORDER — PHENYLEPHRINE 40 MCG/ML (10ML) SYRINGE FOR IV PUSH (FOR BLOOD PRESSURE SUPPORT)
PREFILLED_SYRINGE | INTRAVENOUS | Status: AC
  Filled 2016-04-18: qty 20

## 2016-04-18 MED ORDER — PROPOFOL 10 MG/ML IV BOLUS
INTRAVENOUS | Status: AC
  Filled 2016-04-18: qty 20

## 2016-04-18 MED ORDER — FUROSEMIDE 10 MG/ML IJ SOLN: 20.0000 mg | Freq: Every day | INTRAMUSCULAR | Status: DC

## 2016-04-18 MED ORDER — MIDAZOLAM HCL 2 MG/2ML IJ SOLN
INTRAMUSCULAR | Status: AC
  Filled 2016-04-18: qty 2

## 2016-04-18 MED ORDER — ROCURONIUM BROMIDE 50 MG/5ML IV SOLN
INTRAVENOUS | Status: AC
  Filled 2016-04-18: qty 7

## 2016-04-18 MED ORDER — EPHEDRINE SULFATE 50 MG/ML IJ SOLN
INTRAMUSCULAR | Status: DC | PRN
  Administered 2016-04-18 (×4): 5 mg via INTRAVENOUS

## 2016-04-18 MED ORDER — MIDAZOLAM HCL 2 MG/2ML IJ SOLN
INTRAMUSCULAR | Status: DC | PRN
  Administered 2016-04-18: 2 mg via INTRAVENOUS

## 2016-04-18 MED ORDER — TRACE MINERALS CR-CU-MN-SE-ZN 10-1000-500-60 MCG/ML IV SOLN
INTRAVENOUS | Status: AC
  Administered 2016-04-18: 18:00:00 via INTRAVENOUS
  Filled 2016-04-18: qty 1992

## 2016-04-18 MED ORDER — GLYCOPYRROLATE 0.2 MG/ML IJ SOLN
INTRAMUSCULAR | Status: DC | PRN
Start: 2016-04-18 — End: 2016-04-18
  Administered 2016-04-18: 0.2 mg via INTRAVENOUS

## 2016-04-18 MED ORDER — LACTATED RINGERS IV SOLN
INTRAVENOUS | Status: DC | PRN
Start: 2016-04-18 — End: 2016-04-18
  Administered 2016-04-18: 12:00:00 via INTRAVENOUS

## 2016-04-18 MED ORDER — PROPOFOL 500 MG/50ML IV EMUL
INTRAVENOUS | Status: DC | PRN
  Administered 2016-04-18: 30 ug/kg/min via INTRAVENOUS

## 2016-04-18 MED ORDER — ROCURONIUM BROMIDE 100 MG/10ML IV SOLN
INTRAVENOUS | Status: DC | PRN
  Administered 2016-04-18: 50 mg via INTRAVENOUS
  Administered 2016-04-18: 3 mg via INTRAVENOUS
  Administered 2016-04-18: 50 mg via INTRAVENOUS

## 2016-04-18 MED ORDER — 0.9 % SODIUM CHLORIDE (POUR BTL) OPTIME
TOPICAL | Status: DC | PRN
  Administered 2016-04-18: 1000 mL

## 2016-04-18 MED ORDER — DEXTROSE 5 % IV SOLN
10.0000 mg | INTRAVENOUS | Status: DC | PRN
  Administered 2016-04-18: 20 ug/min via INTRAVENOUS

## 2016-04-18 SURGICAL SUPPLY — 49 items
BAG URIMETER BARDEX IC 350 (UROLOGICAL SUPPLIES) ×1 IMPLANT
BLADE SURG ROTATE 9660 (MISCELLANEOUS) IMPLANT
BNDG GAUZE ELAST 4 BULKY (GAUZE/BANDAGES/DRESSINGS) ×1 IMPLANT
CANISTER SUCTION 2500CC (MISCELLANEOUS) ×2 IMPLANT
CHLORAPREP W/TINT 26ML (MISCELLANEOUS) ×2 IMPLANT
COVER SURGICAL LIGHT HANDLE (MISCELLANEOUS) ×2 IMPLANT
DRAPE LAPAROSCOPIC ABDOMINAL (DRAPES) ×2 IMPLANT
DRAPE WARM FLUID 44X44 (DRAPE) ×2 IMPLANT
DRSG OPSITE POSTOP 4X10 (GAUZE/BANDAGES/DRESSINGS) IMPLANT
DRSG OPSITE POSTOP 4X8 (GAUZE/BANDAGES/DRESSINGS) IMPLANT
DRSG PAD ABDOMINAL 8X10 ST (GAUZE/BANDAGES/DRESSINGS) ×2 IMPLANT
ELECT BLADE 6.5 EXT (BLADE) IMPLANT
ELECT CAUTERY BLADE 6.4 (BLADE) ×2 IMPLANT
ELECT REM PT RETURN 9FT ADLT (ELECTROSURGICAL) ×2
ELECTRODE REM PT RTRN 9FT ADLT (ELECTROSURGICAL) ×1 IMPLANT
GAUZE SPONGE 4X4 12PLY STRL (GAUZE/BANDAGES/DRESSINGS) ×1 IMPLANT
GLOVE BIO SURGEON STRL SZ8 (GLOVE) ×2 IMPLANT
GLOVE BIOGEL PI IND STRL 8 (GLOVE) ×1 IMPLANT
GLOVE BIOGEL PI INDICATOR 8 (GLOVE) ×1
GOWN STRL REUS W/ TWL LRG LVL3 (GOWN DISPOSABLE) ×1 IMPLANT
GOWN STRL REUS W/ TWL XL LVL3 (GOWN DISPOSABLE) ×1 IMPLANT
GOWN STRL REUS W/TWL LRG LVL3 (GOWN DISPOSABLE) ×2
GOWN STRL REUS W/TWL XL LVL3 (GOWN DISPOSABLE) ×2
KIT BASIN OR (CUSTOM PROCEDURE TRAY) ×2 IMPLANT
KIT ROOM TURNOVER OR (KITS) ×2 IMPLANT
LIGASURE IMPACT 36 18CM CVD LR (INSTRUMENTS) IMPLANT
NS IRRIG 1000ML POUR BTL (IV SOLUTION) ×4 IMPLANT
PACK GENERAL/GYN (CUSTOM PROCEDURE TRAY) ×2 IMPLANT
PAD ARMBOARD 7.5X6 YLW CONV (MISCELLANEOUS) ×2 IMPLANT
POUCH OSTOMY 2 3/4  H 3804 (WOUND CARE) ×1
POUCH OSTOMY 2 3/4 H 3804 (WOUND CARE) ×1
POUCH OSTOMY 2 PC DRNBL 2.75 (WOUND CARE) IMPLANT
SPECIMEN JAR LARGE (MISCELLANEOUS) IMPLANT
SPONGE LAP 18X18 X RAY DECT (DISPOSABLE) IMPLANT
STAPLER VISISTAT 35W (STAPLE) ×1 IMPLANT
SUCTION POOLE TIP (SUCTIONS) ×2 IMPLANT
SUT ETHILON 1 LR 30 (SUTURE) ×5 IMPLANT
SUT ETHILON 2 0 FS 18 (SUTURE) ×2 IMPLANT
SUT NOVA 1 T20/GS 25DT (SUTURE) ×1 IMPLANT
SUT PDS AB 1 TP1 96 (SUTURE) ×2 IMPLANT
SUT RET BRIDGE (SUTURE) ×2 IMPLANT
SUT VIC AB 2-0 SH 18 (SUTURE) ×1 IMPLANT
SUT VIC AB 3-0 SH 18 (SUTURE) ×2 IMPLANT
SUT VICRYL AB 2 0 TIES (SUTURE) ×1 IMPLANT
SUT VICRYL AB 3 0 TIES (SUTURE) ×1 IMPLANT
TOWEL OR 17X24 6PK STRL BLUE (TOWEL DISPOSABLE) ×2 IMPLANT
TOWEL OR 17X26 10 PK STRL BLUE (TOWEL DISPOSABLE) ×2 IMPLANT
TRAY FOLEY CATH 16FRSI W/METER (SET/KITS/TRAYS/PACK) IMPLANT
YANKAUER SUCT BULB TIP NO VENT (SUCTIONS) IMPLANT

## 2016-04-18 NOTE — Interval H&P Note (Signed)
History and Physical Interval Note:  04/18/2016 11:20 AM  Christina Castaneda  has presented today for surgery, with the diagnosis of open abdomen  The various methods of treatment have been discussed with the patient and family. After consideration of risks, benefits and other options for treatment, the patient has consented to  Procedure(s): EXPLORATORY LAPAROTOMY (N/A) CLOSURE OF ABDOMEN (N/A) as a surgical intervention .  The patient's history has been reviewed, patient examined, no change in status, stable for surgery.  I have reviewed the patient's chart and labs.  Questions were answered to the patient's satisfaction.     [name] A.

## 2016-04-18 NOTE — Anesthesia Preprocedure Evaluation (Addendum)
Anesthesia Evaluation  Patient identified by MRN, date of birth, ID band Patient awake    Reviewed: Allergy & Precautions, NPO status , Patient's Chart, lab work & pertinent test results  Airway Mallampati: [place] no notable dental hx.    Pulmonary asthma , COPD, Current Smoker,    Pulmonary exam normal        Cardiovascular hypertension, negative cardio ROS Normal cardiovascular exam     Neuro/Psych negative neurological ROS  negative psych ROS   GI/Hepatic negative GI ROS, Neg liver ROS,   Endo/Other  negative endocrine ROSMorbid obesity  Renal/GU negative Renal ROS  negative genitourinary   Musculoskeletal negative musculoskeletal ROS (+)   Abdominal Normal abdominal exam  (+)   Peds negative pediatric ROS (+)  Hematology negative hematology ROS (+)   Anesthesia Other Findings   Reproductive/Obstetrics negative OB ROS                          Anesthesia Physical Anesthesia Plan  ASA: IV  Anesthesia Plan: General   Post-op Pain Management:    Induction: Intravenous  Airway Management Planned: Tracheostomy  Additional Equipment:   Intra-op Plan:   Post-operative Plan: Post-operative intubation/ventilation  Informed Consent: I have reviewed the patients History and Physical, chart, labs and discussed the procedure including the risks, benefits and alternatives for the proposed anesthesia with the patient or authorized representative who has indicated his/her understanding and acceptance.   Dental advisory given  Plan Discussed with: CRNA  Anesthesia Plan Comments: (Straight back ICU to OR. Trach, ventilated)       Anesthesia Quick Evaluation                                  Anesthesia Evaluation  Patient identified by MRN, date of birth, ID band Patient awake    Reviewed: Allergy & Precautions, NPO status , Patient's Chart, lab work & pertinent test  results  History of Anesthesia Complications Negative for: history of anesthetic complications  Airway Mallampati: Intubated       Dental no notable dental hx.    Pulmonary asthma , COPD, Current Smoker,    Pulmonary exam normal breath sounds clear to auscultation       Cardiovascular hypertension, Pt. on medications Normal cardiovascular exam Rhythm:Regular Rate:Normal     Neuro/Psych negative neurological ROS  negative psych ROS   GI/Hepatic negative GI ROS, Neg liver ROS,   Endo/Other  Hypothyroidism Morbid obesity  Renal/GU negative Renal ROS  negative genitourinary   Musculoskeletal negative musculoskeletal ROS (+)   Abdominal   Peds negative pediatric ROS (+)  Hematology negative hematology ROS (+)   Anesthesia Other Findings   Reproductive/Obstetrics negative OB ROS                             Anesthesia Physical Anesthesia Plan  ASA: IV  Anesthesia Plan: General   Post-op Pain Management:    Induction: Intravenous  Airway Management Planned: Oral ETT  Additional Equipment:   Intra-op Plan:   Post-operative Plan: Post-operative intubation/ventilation  Informed Consent: I have reviewed the patients History and Physical, chart, labs and discussed the procedure including the risks, benefits and alternatives for the proposed anesthesia with the patient or authorized representative who has indicated his/her understanding and acceptance.   Dental advisory given  Plan  Discussed with: CRNA  Anesthesia Plan Comments:         Anesthesia Quick Evaluation

## 2016-04-18 NOTE — Progress Notes (Signed)
PULMONARY / CRITICAL CARE MEDICINE   Name: Christina Castaneda MRN: [id number] DOB: 1956/09/30    ADMISSION DATE:  03/28/2016 CONSULTATION DATE:  03/29/2016  REFERRING MD:  CCS  CHIEF COMPLAINT:  Colostomy bag leaking  60 yo female had diverticulitis with sigmoid colon resection, appendectomy at [place] [facility],  on 3/20 and required colostomy.Came home to [place] , [place]  Noted to have feculent material from midline wound and present to ER on 4/5. S/p laparotomy, colostomy removal and end stapling of stoma, partial colectomy, omentectomy, debridement of wound--Dr. Hulen Skains, subsequent ruq stoma and washout, subsequent abra placement by Dr Redmond Pulling  MICROBIOLOGY: Blood Ctx x1 4/9>>> Coag neg staph Urine Ctx 4/9>>> Coag neg staph Tracheal Asp Ctx 4/9>>> yeast Blood Ctx x2 4/18>>> negative Urine Ctx 4/5:  Multiple species present MRSA PCR 4/6:  Negative   ANTIBIOTICS: Cipro 4/5>>> off Flagyl 4/5>>>off Vanco 4/11 >>off  LINES/TUBES: ETT 4/6>>> 4/13 R PICC 4/6>>> L RADIAL ART LINE 4/7>>>4/15 #6 cuffed Trach [employer]) 4/13 >> Rt femoral aline 4/15 >>  SIGNIFICANT EVENTS: 3/20 Exp lap appendectomy,removal sigmoid colon, bowel repair in setting diverticulitis. [facility] 4/6 Exp Lap , colostomy removal, partial colectomy,omentectom, wound debridement, open wound with wound vac. 4/10 Start TPN 04/13/16 -  lightened sedation  with open abdomen. Remains critically ill , intubated on  Low dose levo gtt .  on lower doses versed/ fent gtt  Afebrile   SUBJECTIVE/OVERNIGHT/INTERVAL HX Afebrile Denies pain Remains critically ill  VITAL SIGNS: BP 129/62 mmHg  Pulse 60  Temp(Src) 98.4 F (36.9 C) (Oral)  Resp 18  Ht 5\' 6"  (1.676 m)  Wt 324 lb (146.965 kg)  BMI 52.32 kg/m2  SpO2 99%  LMP 11/18/2012  HEMODYNAMICS: CVP:  [8 mmHg-88 mmHg] 9 mmHg  VENTILATOR SETTINGS: Vent Mode:  [-] PRVC FiO2 (%):  [30 %] 30 % Set Rate:  [16 bmp] 16 bmp Vt Set:  [550 mL] 550 mL PEEP:  [5 cmH20] 5 cmH20 Pressure Support:  [5  cmH20] 5 cmH20 Plateau Pressure:  [12 cmH20-21 cmH20] 17 cmH20  INTAKE / OUTPUT: I/O last 3 completed shifts: In: [id number] [I.V.:1250; NG/GT:85] Out: 4580 [Urine:3985; Drains:535; Stool:60]  PHYSICAL EXAMINATION: General: Alert and interactive, obese Neuro: RASS 1 on fent gtt  HEENT: trach site clean Cardiac: regular Chest: decreased BS BL. Coarse. Sync with vent Abd: wound vac, abra closure device  Ext: 1+ edema Skin: no rashes   LABS:  PULMONARY  Recent Labs Lab 04/14/16 0345 04/17/16 0600  PHART 7.480* 7.547*  PCO2ART 47.8* 32.2*  PO2ART 89.0 129*  HCO3 35.7* 27.9*  TCO2 37 28.9  O2SAT 97.0 99.2    CBC  Recent Labs Lab 04/16/16 0335 04/17/16 0532 04/18/16 0305  HGB 7.4* 7.2* 8.2*  HCT 24.2* 25.7* 26.2*  WBC 10.2 9.2 9.3  PLT 442* 428* 484*    COAGULATION No results for input(s): INR in the last 168 hours.  CARDIAC  No results for input(s): TROPONINI in the last 168 hours. No results for input(s): PROBNP in the last 168 hours.   CHEMISTRY  Recent Labs Lab 04/12/16 0430  04/14/16 0405 04/15/16 0352 04/16/16 0335 04/17/16 0532 04/17/16 0637 04/18/16 0305  NA 136  < > 132* 135  133* 136 123* 131* 133*  133*  K 4.0  < > 3.9 3.5  3.4* 3.3* 6.0* 3.4* 3.8  3.8  CL 97*  < > 92* 93*  93* 97* 88* 93* 97*  97*  CO2 30  < > 30 32  33* 31 27  27 28  28   GLUCOSE 102*  < > 123* 102*  103* 107* 1209* 108* 107*  110*  BUN 20  < > 22* 24*  25* 24* 21* 23* 23*  23*  CREATININE 1.01*  < > 0.85 0.86  0.92 0.91 0.95 0.84 0.71  0.76  CALCIUM 8.0*  < > 8.1* 8.1*  8.1* 8.2* 7.9* 7.8* 8.0*  8.0*  MG 1.9  --  2.0  --  2.0 2.4  --  2.2  PHOS 4.0  < > 3.8 3.5 3.6 8.6* 4.0 4.1  4.0  < > = values in this interval not displayed. Estimated Creatinine Clearance: 108.7 mL/min (by C-G formula based on Cr of 0.76).  LIVER  Recent Labs Lab 04/12/16 0430  04/14/16 0405 04/15/16 0352 04/16/16 0335 04/17/16 0637 04/18/16 0305  AST 24  --   --   --  43*   --   --   ALT 10*  --   --   --  20  --   --   ALKPHOS 64  --   --   --  69  --   --   BILITOT 0.3  --   --   --  0.2*  --   --   PROT 5.8*  --   --   --  6.2*  --   --   ALBUMIN 1.5*  < > 1.5* 1.5* 1.8* 1.7* 1.8*  < > = values in this interval not displayed. INFECTIOUS No results for input(s): LATICACIDVEN, PROCALCITON in the last 168 hours.  ENDOCRINE CBG (last 3)   Recent Labs  04/17/16 1118 04/17/16 1135 04/18/16 0916  GLUCAP 99 101* 97   IMAGING x48h  - image(s) personally visualized  -   highlighted in bold Dg Chest Port 1 View  04/17/2016  CLINICAL DATA:  Respiratory difficulty EXAM: PORTABLE CHEST 1 VIEW COMPARISON:  04/15/2016 FINDINGS: Tracheostomy tube, feeding tube, right arm PICC are stable. Lungs are under aerated and grossly clear. Upper normal heart size. No pneumothorax. IMPRESSION: Stable exam. Low volumes without evidence of consolidation or pulmonary edema. Electronically Signed   By: Marybelle Killings M.D.   On: 04/17/2016 07:40   ASSESSMENT / PLAN:  PULMONARY A: Acute resp failure , prolonged ventialtion  s/p tracheostomy. Hx of COPD with asthma, OSA >> wheezing noted 4/14. Tobacco abuse.   P:   anticipate will advance to TC quickly once abdomen closed Titrate O2 for sat of 88-92%. Brovana, pulmicort with prn albuterol  CARDIOVASCULAR A:  Septic shock -resolved Hx of HTN. Hypervolemia >> great response to lasix 4/15. Echo - nml LV fn, nml RV 4/23 >weaned off pressors , remains in neg bal ~5L   P:  Cont to monitor drop lasix 20 mg IV q 24  RENAL A:   AKI >> resolved. Hypokalemia. Hypomagnesemia. Hypokalemia  P:   Monitor renal fx, urine outpt Replace electrolytes as needed Lasix to 20 q24h for equal balance.   GASTROINTESTINAL A:   Peritonitis s/p laparotomy. Trickle tube feeds as tolerated per CCS >> held 4/23 due to  Vomiting>KUB w/ no ileus  Protein calorie malnutrition  Morbid obesity P:   Post-op care, TPN per CCS, abra closure  device being advanced, anticipate closure in the OR 4/26. Protonix for SUP NPO for OR.  HEMATOLOGIC A:   Anemia of critical illness. P:  F/u CBC. Transfuse for Hb < 7. Lovenox for DVT prevention.  INFECTIOUS A:   Septic shock 2nd to peritonitis  and cellulitis of ostomy site. Coag neg Staph bacteremia, UTI- vancomycin  x 7ds, Rpt blood cx shows clearance  Hx of PCN allergy. P:   Cipro, flagyl for peritonitis- stopped Cipro restarted by CCS.   ENDOCRINE A:   Hx of hypothyroidism. P:   Synthroid IV  NEUROLOGIC A:   Acute metabolic encephalopathy.-improving  Bradycardia from diprivan. P:   RASS goal: 0  Versed prn Taper fent gtt  after initiation of fentanyl patch at 100. Once able to start PO then can add methadone to decrease fentanyl drip.  The patient is critically ill with multiple organ systems failure and requires high complexity decision making for assessment and support, frequent evaluation and titration of therapies, application of advanced monitoring technologies and extensive interpretation of multiple databases.   Critical Care Time devoted to patient care services described in this note is  35  Minutes.  Kara Mead MD. Shade Flood. [facility] Pager [phone] If no response call [phone]     04/18/2016 9:25 AM

## 2016-04-18 NOTE — Progress Notes (Signed)
14 Days Post-Op  Subjective: Christina Castaneda awake on TC  Family at bedside    Objective: Vital signs in last 24 hours: Temp:  [98.3 F (36.8 C)-98.7 F (37.1 C)] 98.4 F (36.9 C) (04/26 0802) Pulse Rate:  [49-71] 60 (04/26 0802) Resp:  [11-25] 17 (04/26 0802) BP: (107-154)/(54-87) 127/61 mmHg (04/26 0802) SpO2:  [88 %-100 %] 99 % (04/26 0802) Arterial Line BP: (125-162)/(52-87) 150/59 mmHg (04/26 0800) FiO2 (%):  [30 %] 30 % (04/26 0802) Weight:  [146.965 kg (324 lb)] 146.965 kg (324 lb) (04/26 0400) Last BM Date: 04/17/16  Intake/Output from previous day: 04/25 0701 - 04/26 0700 In: 2882 [I.V.:830; NG/GT:60; [id number] Out: 3150 [Urine:2880; Drains:220; Stool:50] Intake/Output this shift: Total I/O In: 128 [I.V.:45; TPN:83] Out: 650 [Urine:600; Drains:50]  Incision/Wound:vac ABRA in place   Lab Results:   Recent Labs  04/17/16 0532 04/18/16 0305  WBC 9.2 9.3  HGB 7.2* 8.2*  HCT 25.7* 26.2*  PLT 428* 484*   BMET  Recent Labs  04/17/16 0637 04/18/16 0305  NA 131* 133*  133*  K 3.4* 3.8  3.8  CL 93* 97*  97*  CO2 27 28  28   GLUCOSE 108* 107*  110*  BUN 23* 23*  23*  CREATININE 0.84 0.71  0.76  CALCIUM 7.8* 8.0*  8.0*   Christina Castaneda/INR No results for input(s): LABPROT, INR in the last 72 hours. ABG  Recent Labs  04/17/16 0600  PHART 7.547*  HCO3 27.9*    Studies/Results: Dg Chest Port 1 View  04/17/2016  CLINICAL DATA:  Respiratory difficulty EXAM: PORTABLE CHEST 1 VIEW COMPARISON:  04/15/2016 FINDINGS: Tracheostomy tube, feeding tube, right arm PICC are stable. Lungs are under aerated and grossly clear. Upper normal heart size. No pneumothorax. IMPRESSION: Stable exam. Low volumes without evidence of consolidation or pulmonary edema. Electronically Signed   By: Marybelle Killings M.D.   On: 04/17/2016 07:40    Anti-infectives: Anti-infectives    Start     Dose/Rate Route Frequency Ordered Stop   04/18/16 1000  ciprofloxacin (CIPRO) IVPB 400 mg     400 mg 200  mL/hr over 60 Minutes Intravenous To Short Stay 04/17/16 1026 04/19/16 1000   04/03/16 2100  vancomycin (VANCOCIN) 1,500 mg in sodium chloride 0.9 % 500 mL IVPB  Status:  Discontinued     1,500 mg 250 mL/hr over 120 Minutes Intravenous Every 12 hours 04/02/16 2020 04/06/16 0943   04/02/16 2030  vancomycin (VANCOCIN) 2,500 mg in sodium chloride 0.9 % 500 mL IVPB     2,500 mg 250 mL/hr over 120 Minutes Intravenous  Once 04/02/16 2020 04/02/16 2306   03/29/16 1200  ciprofloxacin (CIPRO) IVPB 400 mg  Status:  Discontinued     400 mg 200 mL/hr over 60 Minutes Intravenous Every 12 hours 03/29/16 0213 04/06/16 0943   03/29/16 0600  [MAR Hold]  ciprofloxacin (CIPRO) IVPB 400 mg     (MAR Hold since 03/28/16 2357)   400 mg 200 mL/hr over 60 Minutes Intravenous On call to O.R. 03/28/16 2306 03/28/16 2355   03/29/16 0600  [MAR Hold]  metroNIDAZOLE (FLAGYL) IVPB 500 mg     (MAR Hold since 03/28/16 2357)   500 mg 100 mL/hr over 60 Minutes Intravenous On call to O.R. 03/28/16 2306 03/29/16 0025   03/29/16 0600  metroNIDAZOLE (FLAGYL) IVPB 500 mg  Status:  Discontinued     500 mg 100 mL/hr over 60 Minutes Intravenous Every 8 hours 03/29/16 0213 04/06/16 0943   03/28/16 2300  ertapenem (INVANZ) 1 g in sodium chloride 0.9 % 50 mL IVPB  Status:  Discontinued     1 g 100 mL/hr over 30 Minutes Intravenous  Once 03/28/16 2259 03/28/16 2306      Assessment/Plan: s/p Procedure(s): EXPLORATORY LAPAROTOMY, PLACEMENT OF ABRA ABDOMINAL WALL CLOSURE SET  (N/A) To OR today for closure /  Removal of ABRA device  The procedure has been discussed with the patient.  Alternative therapies have been discussed with the patient.  Operative risks include bleeding,  Infection,  Organ injury,  Nerve injury,  Blood vessel injury,  DVT,  Pulmonary embolism,  Death,  And possible reoperation.  Medical management risks include worsening of present situation.  The success of the procedure is 50 -90 % at treating patients symptoms.   The patient understands and agrees to proceed.  LOS: 21 days    Shashank Kwasnik A. 04/18/2016

## 2016-04-18 NOTE — Transfer of Care (Signed)
Immediate Anesthesia Transfer of Care Note  Patient: Christina Castaneda  Procedure(s) Performed: Procedure(s): EXPLORATORY LAPAROTOMY (N/A) CLOSURE OF ABDOMEN (N/A)  Patient Location: ICU  Anesthesia Type:General  Level of Consciousness: sedated and Patient remains intubated per anesthesia plan  Airway & Oxygen Therapy: Patient Spontanous Breathing and Patient placed on Ventilator (see vital sign flow sheet for setting)  Post-op Assessment: Report given to RN  Post vital signs: Reviewed and stable  Last Vitals:  Filed Vitals:   04/18/16 0802 04/18/16 0900  BP: 127/61 129/62  Pulse: 60 60  Temp: 36.9 C   Resp: 17 18    Last Pain:  Filed Vitals:   04/18/16 0922  PainSc: 3       Patients Stated Pain Goal: 4 ([id number])  Complications: No apparent anesthesia complications

## 2016-04-18 NOTE — Progress Notes (Signed)
Patient suctioned prior to transport to OR with ambu bag  by CRNA.

## 2016-04-18 NOTE — Brief Op Note (Signed)
03/28/2016 - 04/18/2016  1:14 PM  PATIENT:  Christina Castaneda  60 y.o. female  PRE-OPERATIVE DIAGNOSIS:  open abdomen  POST-OPERATIVE DIAGNOSIS:  same  PROCEDURE:  Procedure(s): EXPLORATORY LAPAROTOMY (N/A) CLOSURE OF ABDOMEN (N/A)  SURGEON:  Surgeon(s) and Role:    * Erroll Luna, MD - Primary        ANESTHESIA:   general  EBL:  Total I/O In: 15 [I.V.:80; TPN:166] Out: 950 [Urine:900; Drains:50]            COUNTS:  YES   DICTATION: .Other Dictation: Dictation Number [id number]  PLAN OF CARE: Admit to inpatient   PATIENT DISPOSITION:  ICU - intubated and hemodynamically stable.   Delay start of Pharmacological VTE agent (>24hrs) due to surgical blood loss or risk of bleeding: yes

## 2016-04-18 NOTE — Anesthesia Postprocedure Evaluation (Signed)
Anesthesia Post Note  Patient: Christina Castaneda  Procedure(s) Performed: Procedure(s) (LRB): EXPLORATORY LAPAROTOMY (N/A) CLOSURE OF ABDOMEN (N/A)  Patient location during evaluation: ICU Anesthesia Type: General Level of consciousness: sedated Pain management: pain level controlled Vital Signs Assessment: post-procedure vital signs reviewed and stable Respiratory status: spontaneous breathing, respiratory function stable and patient on ventilator - see flowsheet for VS Cardiovascular status: blood pressure returned to baseline and stable Postop Assessment: no signs of nausea or vomiting Anesthetic complications: no    Last Vitals:  Filed Vitals:   04/18/16 1400 04/18/16 1500  BP: 113/61 111/70  Pulse: 80 91  Temp:    Resp: 16 24    Last Pain:  Filed Vitals:   04/18/16 1502  PainSc: 3                  Montez Hageman

## 2016-04-18 NOTE — Progress Notes (Signed)
[place] NOTE  Pharmacy Consult for TPN Indication: Intolerance to EN  Allergies  Allergen Reactions  . Penicillins Anaphylaxis, Hives, Shortness Of Breath, Swelling and Other (See Comments)    Angioedema    Patient Measurements: Height: _0  (167.6 cm) Weight: (!) 324 lb (146.965 kg) IBW/kg (Calculated) : 59.3   Vital Signs: Temp: 98.4 F (36.9 C) (04/26 0802) Temp Source: Oral (04/26 0802) BP: 129/62 mmHg (04/26 0900) Pulse Rate: 60 (04/26 0900) Intake/Output from previous day: 04/25 0701 - 04/26 0700 In: 2882 [I.V.:830; NG/GT:60; TPN:1992] Out: 3150 [Urine:2880; Drains:220; Stool:50] Intake/Output from this shift: Total I/O In: 246 [I.V.:80; TPN:166] Out: 650 [Urine:600; Drains:50]  Labs:  Recent Labs  04/16/16 0335 04/17/16 0532 04/18/16 0305  WBC 10.2 9.2 9.3  HGB 7.4* 7.2* 8.2*  HCT 24.2* 25.7* 26.2*  PLT 442* 428* 484*     Recent Labs  04/16/16 0335 04/17/16 0532 04/17/16 0637 04/18/16 0305  NA 136 123* 131* 133*  133*  K 3.3* 6.0* 3.4* 3.8  3.8  CL 97* 88* 93* 97*  97*  CO2 _1 GLUCOSE 107* 1209* 108* 107*  110*  BUN 24* 21* 23* 23*  23*  CREATININE 0.91 0.95 0.84 0.71  0.76  CALCIUM 8.2* 7.9* 7.8* 8.0*  8.0*  MG 2.0 2.4  --  2.2  PHOS 3.6 8.6* 4.0 4.1  4.0  PROT 6.2*  --   --   --   ALBUMIN 1.8*  --  1.7* 1.8*  AST 43*  --   --   --   ALT 20  --   --   --   ALKPHOS 69  --   --   --   BILITOT 0.2*  --   --   --   PREALBUMIN 16.1*  --   --   --   TRIG 128  --   --   --    Estimated Creatinine Clearance: 108.7 mL/min (by C-G formula based on Cr of 0.76).    Recent Labs  04/17/16 1118 04/17/16 1135 04/18/16 0916  GLUCAP 99 101* 97    Medications:  Scheduled:  . antiseptic oral rinse  7 mL Mouth Rinse 10 times per day  . arformoterol  15 mcg Nebulization BID  . budesonide (PULMICORT) nebulizer solution  0.25 mg Nebulization BID  . chlorhexidine gluconate (SAGE KIT)  15 mL Mouth Rinse  BID  . ciprofloxacin  400 mg Intravenous To SSTC  . enoxaparin (LOVENOX) injection  40 mg Subcutaneous Q24H  . feeding supplement (VITAL HIGH PROTEIN)  1,000 mL Per Tube Q24H  . fentaNYL  100 mcg Transdermal Q72H  . furosemide  20 mg Intravenous BID  . levothyroxine  100 mcg Intravenous Daily  . pantoprazole (PROTONIX) IV  40 mg Intravenous QHS  . sodium chloride flush  10-40 mL Intracatheter Q12H    Insulin Requirements in the past 24 hours:  None  Admission: 70 YOF who was 2 weeks out from emergency colectomy/colostomy and discharged on 03/25/16. Noted to have feculent discharge from midline wound when having staples removed and diagnosed with necrotic stoma, peritonitis, and fascial necrosis. Pharmacy consulted to start TPN due to expected prolonged ileus.   Surgeries/Procedures: ~2 wks pta: emergency colectomy/colostomy (Hartman's) 4/6: colostomy removal and end stapling of stoma, partial colectomy, ex-lap, omentectomy, wound debridement 4/7: lap, VAC change, possible debridement 4/10: Re-exploration of abdomen, Wound VAC change 4/12: Re-exploration of abdomen, placement of abdominal wall closure device 4/13: Lurline Idol  placed  4/14, 4/17: Wound VAC change 4/24: Wound vac change 4/25 OR tomorrow for wound vac closure 4/26 OR today for closure and removal of ABRA  GI: prealbumin 3.2 > 13 > 16.1.  Drain with 536m out yesterday.  PPI-IV. TF were resumed but patient had +N/V. TF have been held but surgery not concerned for obstruction since ostomy has flatus and stool Endo: hypothyroid on IV Synthroid. Gout. No hx DM, cbgs controlled Lytes: K 3.8, Mg 2.CoCa 9.92 Renal: SCr stable4, normalized CrCL 71 ml/min - UOP 1 ml/kg/hr (Lasix x2), NS at 10 ml/hr. Furosemide 228mIV twice daily  Pulm: COPD / asthma. Vent post-op, trached 4/13, FiO2 30%, no wean until abd closed. Brovana, Pulmicort Cards: HTN - BP soft on Levophedm also on Lasix Hepatobil: AST 24 > 43, ALT 10 > 20, Tbili wnl, TG  128 Neuro: Fentanyl/Versed gtts- weaning, sedation d/t open abd - GCS 12, CPOT 0, RASS at goal -2 ID: s/p 10d of Cipro/Flagyl for intra-abd infxn. CoNS in urine and blood (1 of 2). Afebrile, WBC wnl this AM. New blood cx drawn 4/18, ngtd.    Best Practices: Lovenox, PPI IV, MC TPN Access: PICC 03/29/16 TPN start date: 03/29/16  Current Nutrition:  Vital HP @ 30 mL/hr  -- on hold Prostat 6026mID (30g and 200 kCal per serving)  -- on hold Clinimix E 5/15 at 83 mL/hr   Nutritional Goals: Per RD 4/25 1300-1475 kCal/day  145-155 g AA/day   Plan:  - Continue Clinimix E 5/15 at 83 ml/hr, this will provide 1414 kcal (100% of goal) and 100 g AA (69% of goal) - No IVFA day #3 in ICU  - F/u restart of TF - Daily multivitamin and trace elements in TPN - TPN labs Mon/Thurs    AliHughes BetterharmD, BCPS Clinical Pharmacist 04/18/2016 9:29 AM

## 2016-04-18 NOTE — H&P (View-Only) (Signed)
14 Days Post-Op  Subjective: Pt awake on TC  Family at bedside    Objective: Vital signs in last 24 hours: Temp:  [98.3 F (36.8 C)-98.7 F (37.1 C)] 98.4 F (36.9 C) (04/26 0802) Pulse Rate:  [49-71] 60 (04/26 0802) Resp:  [11-25] 17 (04/26 0802) BP: (107-154)/(54-87) 127/61 mmHg (04/26 0802) SpO2:  [88 %-100 %] 99 % (04/26 0802) Arterial Line BP: (125-162)/(52-87) 150/59 mmHg (04/26 0800) FiO2 (%):  [30 %] 30 % (04/26 0802) Weight:  [146.965 kg (324 lb)] 146.965 kg (324 lb) (04/26 0400) Last BM Date: 04/17/16  Intake/Output from previous day: 04/25 0701 - 04/26 0700 In: 2882 [I.V.:830; NG/GT:60; [id number]:[id number] Out: 3150 [Urine:2880; Drains:220; Stool:50] Intake/Output this shift: Total I/O In: 128 [I.V.:45; TPN:83] Out: 650 [Urine:600; Drains:50]  Incision/Wound:vac ABRA in place   Lab Results:   Recent Labs  04/17/16 0532 04/18/16 0305  WBC 9.2 9.3  HGB 7.2* 8.2*  HCT 25.7* 26.2*  PLT 428* 484*   BMET  Recent Labs  04/17/16 0637 04/18/16 0305  NA 131* 133*  133*  K 3.4* 3.8  3.8  CL 93* 97*  97*  CO2 27 28  28   GLUCOSE 108* 107*  110*  BUN 23* 23*  23*  CREATININE 0.84 0.71  0.76  CALCIUM 7.8* 8.0*  8.0*   PT/INR No results for input(s): LABPROT, INR in the last 72 hours. ABG  Recent Labs  04/17/16 0600  PHART 7.547*  HCO3 27.9*    Studies/Results: Dg Chest Port 1 View  04/17/2016  CLINICAL DATA:  Respiratory difficulty EXAM: PORTABLE CHEST 1 VIEW COMPARISON:  04/15/2016 FINDINGS: Tracheostomy tube, feeding tube, right arm PICC are stable. Lungs are under aerated and grossly clear. Upper normal heart size. No pneumothorax. IMPRESSION: Stable exam. Low volumes without evidence of consolidation or pulmonary edema. Electronically Signed   By: Marybelle Killings M.D.   On: 04/17/2016 07:40    Anti-infectives: Anti-infectives    Start     Dose/Rate Route Frequency Ordered Stop   04/18/16 1000  ciprofloxacin (CIPRO) IVPB 400 mg     400 mg 200  mL/hr over 60 Minutes Intravenous To Short Stay 04/17/16 1026 04/19/16 1000   04/03/16 2100  vancomycin (VANCOCIN) 1,500 mg in sodium chloride 0.9 % 500 mL IVPB  Status:  Discontinued     1,500 mg 250 mL/hr over 120 Minutes Intravenous Every 12 hours 04/02/16 2020 04/06/16 0943   04/02/16 2030  vancomycin (VANCOCIN) 2,500 mg in sodium chloride 0.9 % 500 mL IVPB     2,500 mg 250 mL/hr over 120 Minutes Intravenous  Once 04/02/16 2020 04/02/16 2306   03/29/16 1200  ciprofloxacin (CIPRO) IVPB 400 mg  Status:  Discontinued     400 mg 200 mL/hr over 60 Minutes Intravenous Every 12 hours 03/29/16 0213 04/06/16 0943   03/29/16 0600  [MAR Hold]  ciprofloxacin (CIPRO) IVPB 400 mg     (MAR Hold since 03/28/16 2357)   400 mg 200 mL/hr over 60 Minutes Intravenous On call to O.R. 03/28/16 2306 03/28/16 2355   03/29/16 0600  [MAR Hold]  metroNIDAZOLE (FLAGYL) IVPB 500 mg     (MAR Hold since 03/28/16 2357)   500 mg 100 mL/hr over 60 Minutes Intravenous On call to O.R. 03/28/16 2306 03/29/16 0025   03/29/16 0600  metroNIDAZOLE (FLAGYL) IVPB 500 mg  Status:  Discontinued     500 mg 100 mL/hr over 60 Minutes Intravenous Every 8 hours 03/29/16 0213 04/06/16 0943   03/28/16 2300  ertapenem (INVANZ) 1 g in sodium chloride 0.9 % 50 mL IVPB  Status:  Discontinued     1 g 100 mL/hr over 30 Minutes Intravenous  Once 03/28/16 2259 03/28/16 2306      Assessment/Plan: s/p Procedure(s): EXPLORATORY LAPAROTOMY, PLACEMENT OF ABRA ABDOMINAL WALL CLOSURE SET  (N/A) To OR today for closure /  Removal of ABRA device  The procedure has been discussed with the patient.  Alternative therapies have been discussed with the patient.  Operative risks include bleeding,  Infection,  Organ injury,  Nerve injury,  Blood vessel injury,  DVT,  Pulmonary embolism,  Death,  And possible reoperation.  Medical management risks include worsening of present situation.  The success of the procedure is 50 -90 % at treating patients symptoms.   The patient understands and agrees to proceed.  LOS: 21 days    Kayna Suppa A. 04/18/2016

## 2016-04-18 NOTE — Progress Notes (Signed)
Patient arrived back from OR to 2S11 and placed back on full vent support.

## 2016-04-18 NOTE — Progress Notes (Signed)
Pt came back from OR on 68mcg of Neo and 12mcg of Propofol. The Propofol and Neo are turned off. She is currently following commands and opening eyes to speech. Vitals are stable. Will continue to monitor closely.

## 2016-04-19 ENCOUNTER — Encounter (HOSPITAL_COMMUNITY): Payer: Self-pay | Admitting: Surgery

## 2016-04-19 LAB — COMPREHENSIVE METABOLIC PANEL
ALT: 35 U/L (ref 14–54)
AST: 54 U/L — ABNORMAL HIGH (ref 15–41)
Albumin: 1.7 g/dL — ABNORMAL LOW (ref 3.5–5.0)
Alkaline Phosphatase: 91 U/L (ref 38–126)
Anion gap: 8 (ref 5–15)
BUN: 24 mg/dL — ABNORMAL HIGH (ref 6–20)
CHLORIDE: 97 mmol/L — AB (ref 101–111)
CO2: 28 mmol/L (ref 22–32)
Calcium: 7.7 mg/dL — ABNORMAL LOW (ref 8.9–10.3)
Creatinine, Ser: 0.83 mg/dL (ref 0.44–1.00)
Glucose, Bld: 116 mg/dL — ABNORMAL HIGH (ref 65–99)
POTASSIUM: 4.2 mmol/L (ref 3.5–5.1)
Sodium: 133 mmol/L — ABNORMAL LOW (ref 135–145)
Total Bilirubin: 0.3 mg/dL (ref 0.3–1.2)
Total Protein: 5.8 g/dL — ABNORMAL LOW (ref 6.5–8.1)

## 2016-04-19 LAB — CBC WITH DIFFERENTIAL/PLATELET
Basophils Absolute: 0 10*3/uL (ref 0.0–0.1)
Basophils Relative: 0 %
EOS PCT: 1 %
Eosinophils Absolute: 0.1 10*3/uL (ref 0.0–0.7)
HEMATOCRIT: 26.1 % — AB (ref 36.0–46.0)
Hemoglobin: 7.9 g/dL — ABNORMAL LOW (ref 12.0–15.0)
LYMPHS ABS: 1.5 10*3/uL (ref 0.7–4.0)
Lymphocytes Relative: 12 %
MCH: 28.1 pg (ref 26.0–34.0)
MCHC: 30.3 g/dL (ref 30.0–36.0)
MCV: 92.9 fL (ref 78.0–100.0)
MONO ABS: 1 10*3/uL (ref 0.1–1.0)
Monocytes Relative: 8 %
Neutro Abs: 10.3 10*3/uL — ABNORMAL HIGH (ref 1.7–7.7)
Neutrophils Relative %: 79 %
Platelets: 456 10*3/uL — ABNORMAL HIGH (ref 150–400)
RBC: 2.81 MIL/uL — AB (ref 3.87–5.11)
RDW: 17.7 % — ABNORMAL HIGH (ref 11.5–15.5)
WBC: 12.9 10*3/uL — AB (ref 4.0–10.5)

## 2016-04-19 LAB — RENAL FUNCTION PANEL
ALBUMIN: 1.7 g/dL — AB (ref 3.5–5.0)
Anion gap: 8 (ref 5–15)
BUN: 24 mg/dL — AB (ref 6–20)
CHLORIDE: 97 mmol/L — AB (ref 101–111)
CO2: 27 mmol/L (ref 22–32)
CREATININE: 0.82 mg/dL (ref 0.44–1.00)
Calcium: 7.6 mg/dL — ABNORMAL LOW (ref 8.9–10.3)
GFR calc Af Amer: >60 mL/min (ref >60)
Glucose, Bld: 111 mg/dL — ABNORMAL HIGH (ref 65–99)
PHOSPHORUS: 4.1 mg/dL (ref 2.5–4.6)
Potassium: 4.1 mmol/L (ref 3.5–5.1)
Sodium: 132 mmol/L — ABNORMAL LOW (ref 135–145)

## 2016-04-19 LAB — GLUCOSE, CAPILLARY: Glucose-Capillary: 116 mg/dL — ABNORMAL HIGH (ref 65–99)

## 2016-04-19 LAB — PHOSPHORUS: PHOSPHORUS: 4.1 mg/dL (ref 2.5–4.6)

## 2016-04-19 LAB — MAGNESIUM: MAGNESIUM: 2.1 mg/dL (ref 1.7–2.4)

## 2016-04-19 MED ORDER — TRACE MINERALS CR-CU-MN-SE-ZN 10-1000-500-60 MCG/ML IV SOLN
INTRAVENOUS | Status: AC
  Administered 2016-04-19: 17:00:00 via INTRAVENOUS
  Filled 2016-04-19: qty 1992

## 2016-04-19 MED ORDER — FENTANYL CITRATE (PF) 100 MCG/2ML IJ SOLN
25.0000 ug | INTRAMUSCULAR | Status: DC | PRN
Start: 2016-04-19 — End: 2016-04-26
  Administered 2016-04-19 – 2016-04-23 (×25): 100 ug via INTRAVENOUS
  Administered 2016-04-23: 25 ug via INTRAVENOUS
  Administered 2016-04-24 (×4): 100 ug via INTRAVENOUS
  Administered 2016-04-24: 50 ug via INTRAVENOUS
  Administered 2016-04-24 – 2016-04-25 (×2): 100 ug via INTRAVENOUS
  Administered 2016-04-25: 50 ug via INTRAVENOUS
  Administered 2016-04-25 (×2): 100 ug via INTRAVENOUS
  Administered 2016-04-25: 50 ug via INTRAVENOUS
  Administered 2016-04-25 – 2016-04-26 (×9): 100 ug via INTRAVENOUS
  Filled 2016-04-19 (×45): qty 2

## 2016-04-19 MED ORDER — FUROSEMIDE 10 MG/ML IJ SOLN
20.0000 mg | Freq: Two times a day (BID) | INTRAMUSCULAR | Status: DC
  Administered 2016-04-19 – 2016-04-26 (×16): 20 mg via INTRAVENOUS
  Filled 2016-04-19 (×16): qty 2

## 2016-04-19 MED ORDER — LEVOTHYROXINE SODIUM 100 MCG PO TABS: 200.0000 ug | ORAL_TABLET | Freq: Every day | ORAL | Status: DC

## 2016-04-19 MED ORDER — FUROSEMIDE 10 MG/ML IJ SOLN
INTRAMUSCULAR | Status: AC
  Filled 2016-04-19: qty 2

## 2016-04-19 NOTE — Progress Notes (Signed)
PULMONARY / CRITICAL CARE MEDICINE   Name: Christina Castaneda MRN: [id number] DOB: Sep 21, 1956    ADMISSION DATE:  03/28/2016 CONSULTATION DATE:  03/29/2016  REFERRING MD:  CCS  CHIEF COMPLAINT:  Colostomy bag leaking  61 yo female had diverticulitis with sigmoid colon resection, appendectomy at [place] [place] on 3/20 and required colostomy.Came home to [place] , [place]  Noted to have feculent material from midline wound and present to ER on 4/5. S/p laparotomy, colostomy removal and end stapling of stoma, partial colectomy, omentectomy, debridement of wound--Dr. Hulen Skains, subsequent ruq stoma and washout, subsequent abra placement by Dr Redmond Pulling  MICROBIOLOGY: Blood Ctx x1 4/9>>> Coag neg staph Urine Ctx 4/9>>> Coag neg staph Tracheal Asp Ctx 4/9>>> yeast Blood Ctx x2 4/18>>> negative Urine Ctx 4/5:  Multiple species present MRSA PCR 4/6:  Negative   ANTIBIOTICS: Cipro 4/5>>> off Flagyl 4/5>>>off Vanco 4/11 >>off  LINES/TUBES: ETT 4/6>>> 4/13 R PICC 4/6>>> L RADIAL ART LINE 4/7>>>4/15 #6 cuffed Trach [employer]) 4/13 >> Rt femoral aline 4/15 >>  SIGNIFICANT EVENTS: 3/20 Exp lap appendectomy,removal sigmoid colon, bowel repair in setting diverticulitis. [facility] 4/6 Exp Lap , colostomy removal, partial colectomy,omentectom, wound debridement, open wound with wound vac. 4/10 Start TPN 04/13/16 -  lightened sedation  with open abdomen. Remains critically ill , intubated on  Low dose levo gtt .  on lower doses versed/ fent gtt  4/26 fascia closure & removal of abra device   SUBJECTIVE/OVERNIGHT/INTERVAL HX Afebrile Denies pain Remains critically ill on vent  VITAL SIGNS: BP 95/55 mmHg  Pulse 56  Temp(Src) 98.3 F (36.8 C) (Oral)  Resp 17  Ht 5\' 6"  (1.676 m)  Wt 320 lb (145.151 kg)  BMI 51.67 kg/m2  SpO2 99%  LMP 11/18/2012  HEMODYNAMICS: CVP:  [7 mmHg-90 mmHg] 77 mmHg  VENTILATOR SETTINGS: Vent Mode:  [-] PRVC FiO2 (%):  [30 %] 30 % Set Rate:  [16 bmp] 16 bmp Vt Set:  [550 mL] 550 mL PEEP:   [5 cmH20] 5 cmH20 Plateau Pressure:  [12 cmH20-20 cmH20] 18 cmH20  INTAKE / OUTPUT: I/O last 3 completed shifts: In: 4839.1 [I.V.:1791.1; NG/GT:60] Out: [id number] [Urine:3735; Drains:200; Stool:50; Blood:10]  PHYSICAL EXAMINATION: General: Alert and interactive, obese Neuro: RASS 1 on fent gtt  HEENT: trach site clean Cardiac: regular Chest: decreased BS BL. Coarse. Sync with vent Abd: wound vac, abra closure device  Ext: 1+ edema Skin: no rashes   LABS:  PULMONARY  Recent Labs Lab 04/14/16 0345 04/17/16 0600  PHART 7.480* 7.547*  PCO2ART 47.8* 32.2*  PO2ART 89.0 129*  HCO3 35.7* 27.9*  TCO2 37 28.9  O2SAT 97.0 99.2    CBC  Recent Labs Lab 04/17/16 0532 04/18/16 0305 04/19/16 0310  HGB 7.2* 8.2* 7.9*  HCT 25.7* 26.2* 26.1*  WBC 9.2 9.3 12.9*  PLT 428* 484* 456*    COAGULATION No results for input(s): INR in the last 168 hours.  CARDIAC  No results for input(s): TROPONINI in the last 168 hours. No results for input(s): PROBNP in the last 168 hours.   CHEMISTRY  Recent Labs Lab 04/14/16 0405  04/16/16 0335 04/17/16 0532 04/17/16 0637 04/18/16 0305 04/19/16 0310  NA 132*  < > 136 123* 131* 133*  133* 133*  132*  K 3.9  < > 3.3* 6.0* 3.4* 3.8  3.8 4.2  4.1  CL 92*  < > 97* 88* 93* 97*  97* 97*  97*  CO2 30  < > 31 27 27 28  28  28  27  GLUCOSE 123*  < > 107* 1209* 108* 107*  110* 116*  111*  BUN 22*  < > 24* 21* 23* 23*  23* 24*  24*  CREATININE 0.85  < > 0.91 0.95 0.84 0.71  0.76 0.83  0.82  CALCIUM 8.1*  < > 8.2* 7.9* 7.8* 8.0*  8.0* 7.7*  7.6*  MG 2.0  --  2.0 2.4  --  2.2 2.1  PHOS 3.8  < > 3.6 8.6* 4.0 4.1  4.0 4.1  4.1  < > = values in this interval not displayed. Estimated Creatinine Clearance: 104 mL/min (by C-G formula based on Cr of 0.83).  LIVER  Recent Labs Lab 04/15/16 0352 04/16/16 0335 04/17/16 0637 04/18/16 0305 04/19/16 0310  AST  --  43*  --   --  54*  ALT  --  20  --   --  35  ALKPHOS  --  69  --   --   91  BILITOT  --  0.2*  --   --  0.3  PROT  --  6.2*  --   --  5.8*  ALBUMIN 1.5* 1.8* 1.7* 1.8* 1.7*  1.7*   INFECTIOUS No results for input(s): LATICACIDVEN, PROCALCITON in the last 168 hours.  ENDOCRINE CBG (last 3)   Recent Labs  04/18/16 0916 04/18/16 1629 04/18/16 2342  GLUCAP 97 85 116*   IMAGING x48h  - image(s) personally visualized  -   highlighted in bold No results found. ASSESSMENT / PLAN:  PULMONARY A: Acute resp failure , prolonged ventialtion  s/p tracheostomy. Hx of COPD with asthma, OSA >> wheezing noted 4/14. Tobacco abuse.   P:   ATC trials - goal daytime Titrate O2 for sat of 88-92%. Brovana, pulmicort with prn albuterol  CARDIOVASCULAR A:  Septic shock -resolved Hx of HTN. Hypervolemia >> great response to lasix 4/15. Echo - nml LV fn, nml RV 4/23 >weaned off pressors , remains in neg bal ~5L   P:  Cont to monitor  RENAL A:   AKI >> resolved. Hypokalemia. Hypomagnesemia. Hypokalemia  P:   Monitor renal fx, urine outpt Replace electrolytes as needed Lasix to 20 q12h for equal balance.   GASTROINTESTINAL A:   Peritonitis s/p laparotomy. Trickle tube feeds as tolerated per CCS >> held 4/23 due to  Vomiting>KUB w/ no ileus  Protein calorie malnutrition  Morbid obesity P:   Post-op care, TPN per CCS Protonix for SUP NPO for OR.  HEMATOLOGIC A:   Anemia of critical illness. P:  F/u CBC. Transfuse for Hb < 7. Lovenox for DVT prevention.  INFECTIOUS A:   Septic shock 2nd to peritonitis and cellulitis of ostomy site. Coag neg Staph bacteremia, UTI- vancomycin  x 7ds, Rpt blood cx shows clearance  Hx of PCN allergy. P:   Cipro, flagyl for peritonitis- stopped Cipro restarted by CCS.   ENDOCRINE A:   Hx of hypothyroidism. P:   Synthroid IV -change to PO soon  NEUROLOGIC A:   Acute metabolic encephalopathy.-improving  Bradycardia from diprivan. P:   RASS goal: 0  Versed prn Taper fent gtt  -change to prn  after initiation of fentanyl patch at 100.   The patient is critically ill with multiple organ systems failure and requires high complexity decision making for assessment and support, frequent evaluation and titration of therapies, application of advanced monitoring technologies and extensive interpretation of multiple databases.   Critical Care Time devoted to patient care services described in this note is  31  Minutes.  Kara Mead MD. Shade Flood. [facility] Pager [phone] If no response call [phone]     04/19/2016 8:37 AM

## 2016-04-19 NOTE — Progress Notes (Signed)
[facility] NOTE  Pharmacy Consult for TPN Indication: Intolerance to EN  Allergies  Allergen Reactions  . Penicillins Anaphylaxis, Hives, Shortness Of Breath, Swelling and Other (See Comments)    Angioedema    Patient Measurements: Height: '5\' 6"'  (167.6 cm) Weight: (!) 320 lb (145.151 kg) IBW/kg (Calculated) : 59.3   Vital Signs: Temp: 98.3 F (36.8 C) (04/27 0700) Temp Source: Oral (04/27 0700) BP: 95/55 mmHg (04/27 0809) Pulse Rate: 66 (04/27 0809) Intake/Output from previous day: 04/26 0701 - 04/27 0700 In: 3393.1 [I.V.:1371.1; NG/GT:30; TPN:1992] Out: 2665 [Urine:2605; Drains:50; Blood:10] Intake/Output from this shift: Total I/O In: 108 [I.V.:25; TPN:83] Out: 120 [Urine:120]  Labs:  Recent Labs  04/17/16 0532 04/18/16 0305 04/19/16 0310  WBC 9.2 9.3 12.9*  HGB 7.2* 8.2* 7.9*  HCT 25.7* 26.2* 26.1*  PLT 428* 484* 456*     Recent Labs  04/17/16 0532 04/17/16 0637 04/18/16 0305 04/19/16 0310  NA 123* 131* 133*  133* 133*  132*  K 6.0* 3.4* 3.8  3.8 4.2  4.1  CL 88* 93* 97*  97* 97*  97*  CO2 '27 27 28  28 28  27  ' GLUCOSE 1209* 108* 107*  110* 116*  111*  BUN 21* 23* 23*  23* 24*  24*  CREATININE 0.95 0.84 0.71  0.76 0.83  0.82  CALCIUM 7.9* 7.8* 8.0*  8.0* 7.7*  7.6*  MG 2.4  --  2.2 2.1  PHOS 8.6* 4.0 4.1  4.0 4.1  4.1  PROT  --   --   --  5.8*  ALBUMIN  --  1.7* 1.8* 1.7*  1.7*  AST  --   --   --  54*  ALT  --   --   --  35  ALKPHOS  --   --   --  91  BILITOT  --   --   --  0.3   Estimated Creatinine Clearance: 104 mL/min (by C-G formula based on Cr of 0.83).    Recent Labs  04/18/16 0916 04/18/16 1629 04/18/16 2342  GLUCAP 97 85 116*    Medications:  Scheduled:  . antiseptic oral rinse  7 mL Mouth Rinse 10 times per day  . arformoterol  15 mcg Nebulization BID  . budesonide (PULMICORT) nebulizer solution  0.25 mg Nebulization BID  . chlorhexidine gluconate (SAGE KIT)  15 mL Mouth Rinse BID  .  enoxaparin (LOVENOX) injection  40 mg Subcutaneous Q24H  . feeding supplement (VITAL HIGH PROTEIN)  1,000 mL Per Tube Q24H  . fentaNYL  100 mcg Transdermal Q72H  . furosemide      . furosemide  20 mg Intravenous Q12H  . levothyroxine  200 mcg Oral QAC breakfast  . pantoprazole (PROTONIX) IV  40 mg Intravenous QHS  . sodium chloride flush  10-40 mL Intracatheter Q12H    Insulin Requirements in the past 24 hours:  None  Admission: 1 YOF who was 2 weeks out from emergency colectomy/colostomy and discharged on 03/25/16. Noted to have feculent discharge from midline wound when having staples removed and diagnosed with necrotic stoma, peritonitis, and fascial necrosis. Pharmacy consulted to start TPN due to expected prolonged ileus.   Surgeries/Procedures: ~2 wks pta: emergency colectomy/colostomy (Hartman's) 4/6: colostomy removal and end stapling of stoma, partial colectomy, ex-lap, omentectomy, wound debridement 4/7: lap, VAC change, possible debridement 4/10: Re-exploration of abdomen, Wound VAC change 4/12: Re-exploration of abdomen, placement of abdominal wall closure device 4/13: Trach placed  4/14, 4/17: Wound VAC  change 4/24: Wound vac change 4/25 OR tomorrow for wound vac closure 4/26 OR today for closure and removal of ABRA  GI: prealbumin 3.2 > 13 > 16.1.  Drain with 50 mL out yesterday.  PPI-IV. TF were resumed but patient had +N/V. TF have been held but surgery not concerned for obstruction since ostomy has flatus and stool, s/p closure of abdomen yesterday Endo: hypothyroid on IV Synthroid. Gout. No hx DM, cbgs controlled Lytes: K 4.2, Mg 2.1,CoCa 9.92, Phos 4.1 Renal: SCr stable, good uop, NS at kvo. Furosemide 63m IV twice daily  Pulm: COPD / asthma. Vent post-op, trached 4/13, FiO2 30%, no wean until abd closed. Brovana, Pulmicort Cards: HTN - BP soft on Levophedm also on Lasix Hepatobil: Small change in LFTs, AST 43 > 54, ALT 20 > 35, Tbili wnl, TG 128 Neuro: On  trach collar, weaning sedation ID: s/p 10d of Cipro/Flagyl for intra-abd infxn. CoNS in urine and blood (1 of 2). Afebrile, WBC wnl this AM. New blood cx drawn 4/18, ngtd.    Best Practices: Lovenox, PPI IV, MC TPN Access: PICC 03/29/16 TPN start date: 03/29/16  Current Nutrition:  Vital HP @ 30 mL/hr  -- on hold Prostat 612mBID (30g and 200 kCal per serving)  -- on hold Clinimix E 5/15 at 83 mL/hr   Nutritional Goals: Per RD 4/25 1300-1475 kCal/day  145-155 g AA/day   Plan:  - Continue Clinimix E 5/15 at 83 ml/hr, this will provide 1414 kcal (100% of goal) and 100 g AA (69% of goal) - No IVFA day #4 in ICU  - F/u restart of TF - Daily multivitamin and trace elements in TPN - TPN labs Mon/Thurs    [place] BetterPharmD, BCPS Clinical Pharmacist 04/19/2016 9:36 AM

## 2016-04-19 NOTE — Trach Care Team (Signed)
Trach Care Progression Note   Patient Details Name: Christina Castaneda MRN: [id number] DOB: [birth year] Today's Date: 04/19/2016   Tracheostomy Assessment    Tracheostomy [place] 6 mm Cuffed (Active)  Status Secured 04/19/2016 12:00 PM  Site Assessment Clean;Dry 04/19/2016 12:00 PM  Site Care Cleansed;Dried;Dressing applied 04/18/2016  8:00 PM  [place] Changed/new 04/18/2016  8:00 PM  Ties Assessment Clean;Dry;Secure 04/19/2016 12:00 PM  Cuff pressure (cm) 24 cm 04/19/2016 12:00 PM  Trach Changed Yes 04/15/2016  8:00 AM  Tracheostomy Equipment at bedside Yes and checklist posted at head of bed 04/19/2016 12:00 PM     Care Needs     Respiratory Therapy O2 Device: Tracheostomy Collar FiO2 (%): 28 % SpO2: 100 % Education: Not applicable Follow up recommendations: Will continue to follow for progression    Speech Language Pathology  SLP chart review complete: Patient ready for SLP services, Request order for PMSV evaluation   Physical Therapy      Occupational Therapy      Nutritional Patient's Current Diet: Total nutrient admixture, NPO Tube Feeding:  Tube Feeding Frequency: TF on HOLD.    Case Management/Social Work      Clinical biochemist Care Team/Provider                        Recommendations [place] Team Members Present-  Doris Cheadle, RT, Molli Barrows, RD Herbie Baltimore, SLP     Trach during this adm and tolerating trach collar. SLP to see.          Claudell Rhody, Jaci Carrel 04/19/2016, 2:26 PM  Scribe for team

## 2016-04-19 NOTE — Progress Notes (Addendum)
Pt states she feels tired and wants to rest. Pt taken off trach collar and placed back on ventilator on Full Support. RN aware. RT will continue to monitor.  Pt placed on trach collar at 0800 and back on FS at 1546

## 2016-04-19 NOTE — Evaluation (Signed)
[facility] Speaking [facility] - Evaluation Patient Details  Name: Christina Castaneda MRN: [id number] Date of Birth: 1956-05-31  Today's Date: 04/19/2016 Time: [id number] SLP Time Calculation (min) (ACUTE ONLY): 28 min  Past Medical History:  Past Medical History  Diagnosis Date  . Goiter   . Hypothyroidism   . Asthma     mild  . Hypertension   . Atypical endometrial hyperplasia   . Heart murmur   . Vitamin B12 deficiency   . Yeast infection     for last 3 weeks  . Gout   . COPD (chronic obstructive pulmonary disease) ([place])    Past Surgical History:  Past Surgical History  Procedure Laterality Date  . Neck surgery  1994    repair disk  . Tonsillectomy      as a child  . Cholecystectomy  2009    gallstone removed  . Robotic assisted total hysterectomy with bilateral salpingo oopherectomy  11/18/2012    Procedure: ROBOTIC ASSISTED TOTAL HYSTERECTOMY WITH BILATERAL SALPINGO OOPHORECTOMY;  Surgeon: Imagene Gurney A. Alycia Rossetti, MD;  Location: [facility];  Service: Gynecology;  Laterality: N/A;  . Colostomy revision N/A 03/28/2016    Procedure: COLOSTOMY REVISION;  Surgeon: Judeth Horn, MD;  Location: [facility];  Service: General;  Laterality: N/A;  . Laparotomy N/A 03/28/2016    Procedure: EXPLORATORY LAPAROTOMY;  Surgeon: Judeth Horn, MD;  Location: [facility];  Service: General;  Laterality: N/A;  . Omentectomy N/A 03/28/2016    Procedure: OMENTECTOMY;  Surgeon: Judeth Horn, MD;  Location: [place];  Service: General;  Laterality: N/A;  . Wound debridement N/A 03/28/2016    Procedure: DEBRIDEMENT WOUND;  Surgeon: Judeth Horn, MD;  Location: [place];  Service: General;  Laterality: N/A;  . Vacuum assisted closure change N/A 03/30/2016    Procedure: ABDOMINAL VAC CHANGE;  Surgeon: Rolm Bookbinder, MD;  Location: [facility];  Service: General;  Laterality: N/A;  . Colostomy N/A 03/30/2016    Procedure: COLOSTOMY;  Surgeon: Rolm Bookbinder, MD;  Location: [facility] [place];  Service: General;  Laterality: N/A;  . Colostomy revision N/A  03/30/2016    Procedure: COLON RESECTION LEFT;  Surgeon: Rolm Bookbinder, MD;  Location: [facility];  Service: General;  Laterality: N/A;  . Laparotomy N/A 04/02/2016    Procedure: Re-exploration of open abdomen, application of abdominal wound vac;  Surgeon: Greer Pickerel, MD;  Location: [facility];  Service: General;  Laterality: N/A;  . Application of wound vac N/A 04/02/2016    Procedure: application of wound vac+;  Surgeon: Greer Pickerel, MD;  Location: [facility];  Service: General;  Laterality: N/A;  . Laparotomy N/A 04/04/2016    Procedure: EXPLORATORY LAPAROTOMY, PLACEMENT OF ABRA ABDOMINAL WALL CLOSURE SET ;  Surgeon: Greer Pickerel, MD;  Location: [facility];  Service: General;  Laterality: N/A;  . Laparotomy N/A 04/18/2016    Procedure: EXPLORATORY LAPAROTOMY;  Surgeon: Erroll Luna, MD;  Location: [place];  Service: General;  Laterality: N/A;  . Abdominal wall defect repair N/A 04/18/2016    Procedure: CLOSURE OF ABDOMEN;  Surgeon: Erroll Luna, MD;  Location: [facility];  Service: General;  Laterality: N/A;   HPI:  60 yo female had diverticulitis with sigmoid colon resection, appendectomy at [place], [place] on 3/20 and required colostomy.Came home to [place] , [place] Noted to have feculent material from midline wound and present to ER on 4/5. S/p laparotomy, colostomy removal and end stapling of stoma, partial colectomy, omentectomy, debridement of wound--Dr. Hulen Skains, subsequent ruq stoma and washout, subsequent abra placement by Dr  Wilson. Pt was intubated 4/6-4/13, then trach.    Assessment / Plan / Recommendation Clinical Impression  Pt's cuff was deflated with strong cough to tracheally expectorate small amount of secretions. Good airflow noted with digital occlusion, followed by good tolerance of PMSV placement for over 20 minutes. Phonation was soft with reduced breath support, requiring Mod cues for deeper inhalations and "chunking" of phrases to facilitate vocal intensity and intelligibility. Recommend to utilize PMSV with  full supervision from staff.     SLP Assessment  Patient needs continued Speech Lanaguage Pathology Services    Follow Up Recommendations   (tba)    Frequency and Duration min 2x/week  2 weeks    PMSV Trial PMSV was placed for: 23 min Able to redirect subglottic air through upper airway: Yes Able to Attain Phonation: Yes Voice Quality: Hoarse;Low vocal intensity;Breathy Able to Expectorate Secretions: Yes Level of Secretion Expectoration with PMSV: Tracheal (x1 upon cuff deflation) Breath Support for Phonation: Moderately decreased Intelligibility: Intelligibility reduced Conversation: 75-100% accurate Respirations During Trial: 14 SpO2 During Trial: 100 % Pulse During Trial: 62 Behavior: Alert;Controlled;Cooperative   Tracheostomy Tube       Vent Dependency  FiO2 (%): 28 %    Cuff Deflation Trial  GO Tolerated Cuff Deflation: Yes Length of Time for Cuff Deflation Trial: over 30 min Behavior: Alert;Controlled;Cooperative        Germain Osgood, M.A. CCC-SLP [phone]  Germain Osgood 04/19/2016, 3:24 PM

## 2016-04-19 NOTE — Progress Notes (Signed)
[facility] Progress Note  1 Day Post-Op  Subjective: Pt doing well post-operatively.  No N/V, not much pain.  We did dressing change at bedside.  She's very thankful for our care.  I showed her the picture of her abdomen.  Objective: Vital signs in last 24 hours: Temp:  [98.3 F (36.8 C)-99.2 F (37.3 C)] 98.3 F (36.8 C) (04/27 0700) Pulse Rate:  [53-92] 66 (04/27 0809) Resp:  [15-25] 24 (04/27 0809) BP: (88-141)/(43-84) 95/55 mmHg (04/27 0809) SpO2:  [90 %-99 %] 95 % (04/27 0809) Arterial Line BP: (109-159)/(53-87) 152/75 mmHg (04/27 0500) FiO2 (%):  [30 %] 30 % (04/27 0809) Weight:  [320 lb (145.151 kg)] 320 lb (145.151 kg) (04/27 0500) Last BM Date: 04/17/16  Intake/Output from previous day: 04/26 0701 - 04/27 0700 In: 3393.1 [I.V.:1371.1; NG/GT:30; [id number] Out: 2665 [Urine:2605; Drains:50; Blood:10] Intake/Output this shift: Total I/O In: 108 [I.V.:25; TPN:83] Out: 120 [Urine:120]  PE: Gen:  Alert, NAD, pleasant Abd: Obese, soft, mild distension, mild tenderness, +BS, no HSM, incisions C/D/I, drain with minimal sanguinous drainage, no abdominal scars noted    Above:  Before abdominal closure with ABRA device    Above:  04/19/16 Abdominal wound closure, open old ostomy site wound in LLQ   Lab Results:   Recent Labs  04/18/16 0305 04/19/16 0310  WBC 9.3 12.9*  HGB 8.2* 7.9*  HCT 26.2* 26.1*  PLT 484* 456*   BMET  Recent Labs  04/18/16 0305 04/19/16 0310  NA 133*  133* 133*  132*  K 3.8  3.8 4.2  4.1  CL 97*  97* 97*  97*  CO2 28  28 28  27   GLUCOSE 107*  110* 116*  111*  BUN 23*  23* 24*  24*  CREATININE 0.71  0.76 0.83  0.82  CALCIUM 8.0*  8.0* 7.7*  7.6*   PT/INR No results for input(s): LABPROT, INR in the last 72 hours. CMP     Component Value Date/Time   NA 133* 04/19/2016 0310   NA 132* 04/19/2016 0310   K 4.2 04/19/2016 0310   K 4.1 04/19/2016 0310   CL 97* 04/19/2016 0310   CL 97* 04/19/2016 0310     CO2 28 04/19/2016 0310   CO2 27 04/19/2016 0310   GLUCOSE 116* 04/19/2016 0310   GLUCOSE 111* 04/19/2016 0310   BUN 24* 04/19/2016 0310   BUN 24* 04/19/2016 0310   CREATININE 0.83 04/19/2016 0310   CREATININE 0.82 04/19/2016 0310   CALCIUM 7.7* 04/19/2016 0310   CALCIUM 7.6* 04/19/2016 0310   PROT 5.8* 04/19/2016 0310   ALBUMIN 1.7* 04/19/2016 0310   ALBUMIN 1.7* 04/19/2016 0310   AST 54* 04/19/2016 0310   ALT 35 04/19/2016 0310   ALKPHOS 91 04/19/2016 0310   BILITOT 0.3 04/19/2016 0310   GFRNONAA >60 04/19/2016 0310   GFRNONAA >60 04/19/2016 0310   GFRAA >60 04/19/2016 0310   GFRAA >60 04/19/2016 0310   Lipase     Component Value Date/Time   LIPASE 64* 03/28/2016 1705       Studies/Results: No results found.  Anti-infectives: Anti-infectives    Start     Dose/Rate Route Frequency Ordered Stop   04/18/16 1000  ciprofloxacin (CIPRO) IVPB 400 mg  Status:  Discontinued     400 mg 200 mL/hr over 60 Minutes Intravenous To Short Stay 04/17/16 1026 04/18/16 1340   04/03/16 2100  vancomycin (VANCOCIN) 1,500 mg in sodium chloride 0.9 % 500 mL IVPB  Status:  Discontinued     1,500 mg 250 mL/hr over 120 Minutes Intravenous Every 12 hours 04/02/16 2020 04/06/16 0943   04/02/16 2030  vancomycin (VANCOCIN) 2,500 mg in sodium chloride 0.9 % 500 mL IVPB     2,500 mg 250 mL/hr over 120 Minutes Intravenous  Once 04/02/16 2020 04/02/16 2306   03/29/16 1200  ciprofloxacin (CIPRO) IVPB 400 mg  Status:  Discontinued     400 mg 200 mL/hr over 60 Minutes Intravenous Every 12 hours 03/29/16 0213 04/06/16 0943   03/29/16 0600  [MAR Hold]  ciprofloxacin (CIPRO) IVPB 400 mg     (MAR Hold since 03/28/16 2357)   400 mg 200 mL/hr over 60 Minutes Intravenous On call to O.R. 03/28/16 2306 03/28/16 2355   03/29/16 0600  [MAR Hold]  metroNIDAZOLE (FLAGYL) IVPB 500 mg     (MAR Hold since 03/28/16 2357)   500 mg 100 mL/hr over 60 Minutes Intravenous On call to O.R. 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 Hulen Skains, subsequent RUQ stoma and washout, subsequent ABRA placement by Dr. Redmond Pulling POD #1 s/p Removal of ABRA device, closure of abdominal wall with retention sutures - Dr. Brantley Stage -Hold to TF for now, continue TPN -SLP swallow evaluation and PMV trials.  If she's able to tolerate PO then will go that route, if recommendation is NPO then will start tube feeds through NG.   -Ostomy and wound care -Abdominal binder ordered -Retention sutures will stay in for at least 4-6 weeks as well as interrupted Novafil sutures -Okay to elevate the head of bed Respiratory - TC at 30% FIO2, PMV trials VTE prophylaxis - SCD/lovenox Deconditioning - PT/OT ordered, bed exercises recommended and start to get OOB.  Use abdominal binder. PCM - TPN started 4/6. Vomited, TF stopped  Dispo - ICU    LOS: 22 days    Nat Christen 04/19/2016, 10:41 AM Pager: [phone]  (7am - 4:30pm M-F; 7am - 11:30am Sa/Su)

## 2016-04-19 NOTE — Evaluation (Signed)
Clinical/Bedside Swallow Evaluation Patient Details  Name: Christina Castaneda MRN: [id number] Date of Birth: November 16, 1956  Today's Date: 04/19/2016 Time: SLP Start Time (ACUTE ONLY): [id number] SLP Stop Time (ACUTE ONLY): 1417 SLP Time Calculation (min) (ACUTE ONLY): 28 min  Past Medical History:  Past Medical History  Diagnosis Date  . Goiter   . Hypothyroidism   . Asthma     mild  . Hypertension   . Atypical endometrial hyperplasia   . Heart murmur   . Vitamin B12 deficiency   . Yeast infection     for last 3 weeks  . Gout   . COPD (chronic obstructive pulmonary disease) ([place])    Past Surgical History:  Past Surgical History  Procedure Laterality Date  . Neck surgery  1994    repair disk  . Tonsillectomy      as a child  . Cholecystectomy  2009    gallstone removed  . Robotic assisted total hysterectomy with bilateral salpingo oopherectomy  11/18/2012    Procedure: ROBOTIC ASSISTED TOTAL HYSTERECTOMY WITH BILATERAL SALPINGO OOPHORECTOMY;  Surgeon: Imagene Gurney A. Alycia Rossetti, MD;  Location: [facility];  Service: Gynecology;  Laterality: N/A;  . Colostomy revision N/A 03/28/2016    Procedure: COLOSTOMY REVISION;  Surgeon: Judeth Horn, MD;  Location: [facility];  Service: General;  Laterality: N/A;  . Laparotomy N/A 03/28/2016    Procedure: EXPLORATORY LAPAROTOMY;  Surgeon: Judeth Horn, MD;  Location: [facility];  Service: General;  Laterality: N/A;  . Omentectomy N/A 03/28/2016    Procedure: OMENTECTOMY;  Surgeon: Judeth Horn, MD;  Location: [facility];  Service: General;  Laterality: N/A;  . Wound debridement N/A 03/28/2016    Procedure: DEBRIDEMENT WOUND;  Surgeon: Judeth Horn, MD;  Location: [facility];  Service: General;  Laterality: N/A;  . Vacuum assisted closure change N/A 03/30/2016    Procedure: ABDOMINAL VAC CHANGE;  Surgeon: Rolm Bookbinder, MD;  Location: [facility];  Service: General;  Laterality: N/A;  . Colostomy N/A 03/30/2016    Procedure: COLOSTOMY;  Surgeon: Rolm Bookbinder, MD;  Location: [facility];  Service:  General;  Laterality: N/A;  . Colostomy revision N/A 03/30/2016    Procedure: COLON RESECTION LEFT;  Surgeon: Rolm Bookbinder, MD;  Location: [facility];  Service: General;  Laterality: N/A;  . Laparotomy N/A 04/02/2016    Procedure: Re-exploration of open abdomen, application of abdominal wound vac;  Surgeon: Greer Pickerel, MD;  Location: [facility];  Service: General;  Laterality: N/A;  . Application of wound vac N/A 04/02/2016    Procedure: application of wound vac+;  Surgeon: Greer Pickerel, MD;  Location: [facility];  Service: General;  Laterality: N/A;  . Laparotomy N/A 04/04/2016    Procedure: EXPLORATORY LAPAROTOMY, PLACEMENT OF ABRA ABDOMINAL WALL CLOSURE SET ;  Surgeon: Greer Pickerel, MD;  Location: [facility];  Service: General;  Laterality: N/A;  . Laparotomy N/A 04/18/2016    Procedure: EXPLORATORY LAPAROTOMY;  Surgeon: Erroll Luna, MD;  Location: [facility];  Service: General;  Laterality: N/A;  . Abdominal wall defect repair N/A 04/18/2016    Procedure: CLOSURE OF ABDOMEN;  Surgeon: Erroll Luna, MD;  Location: [facility];  Service: General;  Laterality: N/A;   HPI:  60 yo female had diverticulitis with sigmoid colon resection, appendectomy at [place], [place] on 3/20 and required colostomy.Came home to [place] , [place] Noted to have feculent material from midline wound and present to ER on 4/5. S/p laparotomy, colostomy removal and end stapling of stoma, partial colectomy, omentectomy, debridement of wound--Dr. Hulen Skains, subsequent  ruq stoma and washout, subsequent abra placement by Dr Redmond Pulling. Pt was intubated 4/6-4/13, then trach.    Assessment / Plan / Recommendation Clinical Impression  Pt consumed ice chips and small sips of water with coughing after initial sips not observed as trials continued. Her voice is hoarse and low in intensity, although reflexive cough does appear to be strong. Recommend to proceed with FEES prior to initiating PO diet given risk for aspiration and pt's already complicated hospital course. Can attempt on  next date.    Aspiration Risk  Moderate aspiration risk    Diet Recommendation NPO   Medication Administration: Via alternative means    Other  Recommendations Oral Care Recommendations: Oral care QID   Follow up Recommendations   (tba)    Frequency and Duration min 2x/week          Prognosis        Swallow Study   General HPI: 60 yo female had diverticulitis with sigmoid colon resection, appendectomy at [place], [place] on 3/20 and required colostomy.Came home to [place] , [place] Noted to have feculent material from midline wound and present to ER on 4/5. S/p laparotomy, colostomy removal and end stapling of stoma, partial colectomy, omentectomy, debridement of wound--Dr. Hulen Skains, subsequent ruq stoma and washout, subsequent abra placement by Dr Redmond Pulling. Pt was intubated 4/6-4/13, then trach.  Type of Study: Bedside Swallow Evaluation Previous Swallow Assessment: none in chart, denies any prior dysphagia Diet Prior to this Study: NPO Temperature Spikes Noted: No Respiratory Status: Trach;Trach Collar Trach Size and Type: Cuff;#6;Deflated;With PMSV in place History of Recent Intubation: Yes Length of Intubations (days): 7 days Date extubated:  (trach 4/13) Behavior/Cognition: Alert;Cooperative;Pleasant mood Oral Cavity Assessment: Within Functional Limits Oral Care Completed by SLP: No Oral Cavity - Dentition: Missing dentition Patient Positioning: Upright in bed Baseline Vocal Quality: Breathy;Hoarse;Low vocal intensity    Oral/Motor/Sensory Function     Ice Chips Ice chips: Impaired Presentation: Spoon Pharyngeal Phase Impairments: Cough - Immediate;Suspected delayed Swallow   Thin Liquid Thin Liquid: Impaired Presentation: Spoon;Straw Pharyngeal  Phase Impairments: Suspected delayed Swallow    Nectar Thick Nectar Thick Liquid: Not tested   Honey Thick Honey Thick Liquid: Not tested   Puree Puree: Not tested   Solid   GO   Solid: Not tested        Germain Osgood, M.A.  CCC-SLP [phone]  Germain Osgood 04/19/2016,3:31 PM

## 2016-04-19 NOTE — Op Note (Addendum)
NAMESHAWNAY, EAKEN NO.:  [id number]  MEDICAL RECORD NO.:  [id number]  LOCATION:  2S11C                        FACILITY:  [facility]  PHYSICIAN:  Marcello Moores A. Tyjai Matuszak, M.D.DATE OF BIRTH:  [birth year]  DATE OF PROCEDURE:04/18/2016 DATE OF DISCHARGE:                              OPERATIVE REPORT   PREOPERATIVE DIAGNOSIS:  Open abdomen secondary to intraabdominal sepsis.  POSTOPERATIVE DIAGNOSIS:  Open abdomen secondary to intraabdominal sepsis.  PROCEDURE:  Removal of ABRA device and closure of abdominal wall.  SURGEON:  Marcello Moores A. Kyler Lerette, M.D.  ANESTHESIA:  General endotracheal anesthesia.  EBL:  Minimal.  SPECIMENS:  None.  DRAINS:  None.  INDICATIONS FOR PROCEDURE:  The patient is a 60 year old female, admitted 2 weeks ago secondary to intraabdominal sepsis.  She underwent colostomy in [place], [place] 4 weeks ago.  Her colostomy died and she developed intraabdominal sepsis and necrosis of her abdominal wall fascia.  This required emergent exploration with debridement and washout.  Her colostomy was removed and she was left open with the ABRA device placed by Dr. Redmond Pulling.  This device was used to slowly bring her abdominal wall together to facilitate closure.  This has been in place for the last almost few weeks.  Over the last 5 days, she has made good progress and came off the ventilator.  She is awake and alert, on trach collar.  She is bed ridden secondary to this device.  This was changed 2 days ago and the fascia looked close enough to close.  She is morbidly obese, which creates multiple problems.  At this point in time, it is being 2 weeks out and the fact that her fascia was approximating each other and looked to be closeable, recommended to her and her husband that we go ahead and proceed to get her abdominal wall closed, we could begin to mobilize her and get her out of bed.  I explained that she will have her hernia no matter when this was  done, given her poor abdominal wall status and morbid obesity.  I discussed use of possible bioprosthetic and mesh, but explained these would more than likely not prevent hernia formation and are expensive to use.  The ideal situation is to be able to use her native tissue to close her abdominal wall.  I explained she has well and over 80-100% chance of developing a hernia down the road, but this will least unable to get her out of bed and at some point begin physical therapy as the step getting closer going home. The patient understood this as well as her husband and family.  Risk of bleeding, infection, bowel injury, bladder injury, fistula formation, recurrence of wound, breakdown of wound, death, DVT, sepsis, injury to small intestine, injury to large intestine, intracutaneous fistula, hernia formation, and other exacerbation of underlying medical problems discussed.  DESCRIPTION OF PROCEDURE:  The patient was brought from the ICU and then taken to the operating room.  She was placed under anesthesia through her tracheostomy.  The ABRA device was then exposed.  I used scissors to cut and remove the sponge from the wound VAC portion of central part of this.  This exposed the underlying  vessel loops that were used to tighten the device.  I cut away all the occlusive dressing.  I was careful to remove all the anchors on both sides of the incision and passed these off the field and these were all accounted for.  The vessel loops were also then removed and passed off the field.  We then prepped and draped the abdomen in a sterile fashion.  I then was able to remove the inner circular buttress, and this was taken down in its entirety and passed off the field.  The sheet barrier was then removed in its entirety.  I then felt around the inside of the intraabdominal cavity and felt no other foreign body circumferentially.  Her bowel was densely adherent to each other.  This was a bowel BRAIN  _.  I could not separate loops and chose not to do so being 2 weeks out and this would have lead to fistula formation if I did that.  I very carefully irrigated out the abdominal cavity.  At this point in time, I assessed her fascia, and for the most part, I had about areas that were maybe 2 cm apart when pulled together.  She did lose a lot of her fascia around her old ostomy site in the left lower quadrant, this was closer to 11. Superiorly, the fascia was about 1 cm apart.  I felt closure with sutures would be her best interest.  I did not feel the use of biologic gave her any advantage in this circumstance, and since she was going to get her hernia with or without it especially if it was used as a bridge. After irrigating the abdominal cavity, I felt around a 2nd time to make sure there were no foreign bodies from the ABRA device retained which I could not feel any.  I then began to close the fascia with a combination of #1 Novafil pop-off sutures and 2-0 nylon retention sutures with bridges.  This was done from the top and bottom.  The central portion of the wound where the largest fascial defect was noted, was closed with full-thickness bites of tissue using the #1 Novafil sutures.  I checked to make sure there was no bow stringing of the sutures under the incisions as I since this would potentially lead to fistula formation. There was none upon checking prior to complete closure of the abdomen. Once the abdomen was closed, I packed the open skin wound with Kerlix soaked in saline.  The left upper quadrant wound was packed with a saline soaked Kerlix.  Colostomy appliances were placed and dry dressings were applied.  General anesthesia was terminated and the patient was taken back to the ICU in stable condition.  All final counts of sponge, needles, and instruments were found to be correct for this portion of the case.     Dawnielle Christiana A. Abie Killian, M.D.     TAC/MEDQ  D:   04/18/2016  T:  04/18/2016  Job:  [id number]

## 2016-04-20 LAB — CBC WITH DIFFERENTIAL/PLATELET
BASOS ABS: 0 10*3/uL (ref 0.0–0.1)
BASOS PCT: 0 %
EOS ABS: 0.3 10*3/uL (ref 0.0–0.7)
Eosinophils Relative: 2 %
HEMATOCRIT: 18.7 % — AB (ref 36.0–46.0)
HEMOGLOBIN: 5.7 g/dL — AB (ref 12.0–15.0)
Lymphocytes Relative: 17 %
Lymphs Abs: 2.2 10*3/uL (ref 0.7–4.0)
MCH: 28.5 pg (ref 26.0–34.0)
MCHC: 30.5 g/dL (ref 30.0–36.0)
MCV: 93.5 fL (ref 78.0–100.0)
MONOS PCT: 10 %
Monocytes Absolute: 1.4 10*3/uL — ABNORMAL HIGH (ref 0.1–1.0)
NEUTROS ABS: 9.3 10*3/uL — AB (ref 1.7–7.7)
NEUTROS PCT: 71 %
Platelets: 583 10*3/uL — ABNORMAL HIGH (ref 150–400)
RBC: 2 MIL/uL — ABNORMAL LOW (ref 3.87–5.11)
RDW: 17.5 % — ABNORMAL HIGH (ref 11.5–15.5)
WBC: 13.2 10*3/uL — AB (ref 4.0–10.5)

## 2016-04-20 LAB — RENAL FUNCTION PANEL
ALBUMIN: 1.8 g/dL — AB (ref 3.5–5.0)
ANION GAP: 7 (ref 5–15)
BUN: 22 mg/dL — ABNORMAL HIGH (ref 6–20)
CALCIUM: 7.8 mg/dL — AB (ref 8.9–10.3)
CO2: 27 mmol/L (ref 22–32)
CREATININE: 0.78 mg/dL (ref 0.44–1.00)
Chloride: 96 mmol/L — ABNORMAL LOW (ref 101–111)
Glucose, Bld: 104 mg/dL — ABNORMAL HIGH (ref 65–99)
PHOSPHORUS: 3.3 mg/dL (ref 2.5–4.6)
Potassium: 3.9 mmol/L (ref 3.5–5.1)
SODIUM: 130 mmol/L — AB (ref 135–145)

## 2016-04-20 LAB — GLUCOSE, CAPILLARY
Glucose-Capillary: 100 mg/dL — ABNORMAL HIGH (ref 65–99)
Glucose-Capillary: 99 mg/dL (ref 65–99)
Glucose-Capillary: 85 mg/dL (ref 65–99)
Glucose-Capillary: 88 mg/dL (ref 65–99)

## 2016-04-20 LAB — CBC
HCT: 25.9 % — ABNORMAL LOW (ref 36.0–46.0)
Hemoglobin: 7.9 g/dL — ABNORMAL LOW (ref 12.0–15.0)
MCH: 28.8 pg (ref 26.0–34.0)
MCHC: 30.5 g/dL (ref 30.0–36.0)
MCV: 94.5 fL (ref 78.0–100.0)
PLATELETS: 452 10*3/uL — AB (ref 150–400)
RBC: 2.74 MIL/uL — AB (ref 3.87–5.11)
RDW: 17.6 % — ABNORMAL HIGH (ref 11.5–15.5)
WBC: 10.8 10*3/uL — ABNORMAL HIGH (ref 4.0–10.5)

## 2016-04-20 LAB — PREPARE RBC (CROSSMATCH)

## 2016-04-20 MED ORDER — SODIUM CHLORIDE 0.9 % IV SOLN: Freq: Once | INTRAVENOUS | Status: DC

## 2016-04-20 MED ORDER — TRACE MINERALS CR-CU-MN-SE-ZN 10-1000-500-60 MCG/ML IV SOLN
INTRAVENOUS | Status: AC
  Administered 2016-04-20: 18:00:00 via INTRAVENOUS
  Filled 2016-04-20: qty 1992

## 2016-04-20 MED ORDER — LEVOTHYROXINE SODIUM 100 MCG IV SOLR
100.0000 ug | Freq: Every day | INTRAVENOUS | Status: DC
  Administered 2016-04-20 – 2016-04-23 (×4): 100 ug via INTRAVENOUS
  Filled 2016-04-20 (×4): qty 5

## 2016-04-20 NOTE — Progress Notes (Signed)
[facility] NOTE  Pharmacy Consult for TPN Indication: Intolerance to EN  Allergies  Allergen Reactions  . Penicillins Anaphylaxis, Hives, Shortness Of Breath, Swelling and Other (See Comments)    Angioedema    Patient Measurements: Height: '5\' 6"'  (167.6 cm) Weight: (!) 319 lb (144.697 kg) IBW/kg (Calculated) : 59.3   Vital Signs: Temp: 98.7 F (37.1 C) (04/28 0813) Temp Source: Oral (04/28 0813) BP: 120/57 mmHg (04/28 0734) Pulse Rate: 58 (04/28 0734) Intake/Output from previous day: 04/27 0701 - 04/28 0700 In: 2220 [I.V.:192.5; NG/GT:30; PIR:5188.4] Out: 3280 [Urine:3180; Stool:100] Intake/Output from this shift:    Labs:  Recent Labs  04/19/16 0310 04/20/16 0315 04/20/16 0709  WBC 12.9* 13.2* 10.8*  HGB 7.9* 5.7* 7.9*  HCT 26.1* 18.7* 25.9*  PLT 456* 583* 452*     Recent Labs  04/18/16 0305 04/19/16 0310 04/20/16 0315  NA 133*  133* 133*  132* 130*  K 3.8  3.8 4.2  4.1 3.9  CL 97*  97* 97*  97* 96*  CO2 '28  28 28  27 27  ' GLUCOSE 107*  110* 116*  111* 104*  BUN 23*  23* 24*  24* 22*  CREATININE 0.71  0.76 0.83  0.82 0.78  CALCIUM 8.0*  8.0* 7.7*  7.6* 7.8*  MG 2.2 2.1  --   PHOS 4.1  4.0 4.1  4.1 3.3  PROT  --  5.8*  --   ALBUMIN 1.8* 1.7*  1.7* 1.8*  AST  --  54*  --   ALT  --  35  --   ALKPHOS  --  91  --   BILITOT  --  0.3  --    Estimated Creatinine Clearance: 107.6 mL/min (by C-G formula based on Cr of 0.78).    Recent Labs  04/18/16 1629 04/18/16 2342 04/20/16 0346  GLUCAP 85 116* 99    Medications:  Scheduled:  . sodium chloride   Intravenous Once  . antiseptic oral rinse  7 mL Mouth Rinse 10 times per day  . arformoterol  15 mcg Nebulization BID  . budesonide (PULMICORT) nebulizer solution  0.25 mg Nebulization BID  . chlorhexidine gluconate (SAGE KIT)  15 mL Mouth Rinse BID  . enoxaparin (LOVENOX) injection  40 mg Subcutaneous Q24H  . feeding supplement (VITAL HIGH PROTEIN)  1,000 mL Per  Tube Q24H  . fentaNYL  100 mcg Transdermal Q72H  . furosemide  20 mg Intravenous Q12H  . levothyroxine  100 mcg Intravenous Daily  . pantoprazole (PROTONIX) IV  40 mg Intravenous QHS  . sodium chloride flush  10-40 mL Intracatheter Q12H    Insulin Requirements in the past 24 hours:  None  Admission: 63 YOF who was 2 weeks out from emergency colectomy/colostomy and discharged on 03/25/16. Noted to have feculent discharge from midline wound when having staples removed and diagnosed with necrotic stoma, peritonitis, and fascial necrosis. Pharmacy consulted to start TPN due to expected prolonged ileus.   Surgeries/Procedures: ~2 wks pta: emergency colectomy/colostomy ([facility]'s) 4/6: colostomy removal and end stapling of stoma, partial colectomy, ex-lap, omentectomy, wound debridement 4/7: lap, VAC change, possible debridement 4/10: Re-exploration of abdomen, Wound VAC change 4/12: Re-exploration of abdomen, placement of abdominal wall closure device 4/13: Trach placed  4/14, 4/17: Wound VAC change 4/24: Wound vac change 4/25 OR tomorrow for wound vac closure 4/26 OR today for closure and removal of ABRA  GI: prealbumin 3.2 > 13 > 16.1.  Drain with 50 mL out yesterday.  PPI-IV.  TF were resumed but patient had +N/V. TF have been held but surgery not concerned for obstruction since ostomy has flatus and stool, s/p closure of abdomen yesterday Endo: hypothyroid on IV Synthroid. Gout. No hx DM, cbgs controlled Lytes: K 3.9, Mg 2.1,CoCa 9.9, Phos 3.3 Renal: SCr stable, good uop, NS at kvo. Furosemide 46m IV twice daily  Pulm: COPD / asthma. Trached 4/13 [place], Pulmicort Cards: HTN - VSS. Lasix q12 Hepatobil: Small change in LFTs, AST 43 > 54, ALT 20 > 35, Tbili wnl, TG 128 Neuro: On trach collar, weaning sedation ID: s/p 10d of Cipro/Flagyl for intra-abd infxn. CoNS in urine and blood (1 of 2). Afebrile, WBC wnl this AM. New blood cx drawn 4/18, ngF.    Best Practices: Lovenox,  PPI IV, MC TPN Access: PICC 03/29/16 TPN start date: 03/29/16  Current Nutrition:  Vital HP   -- on hold Clinimix E 5/15 at 83 mL/hr   Nutritional Goals: Per RD 4/25 1300-1475 kCal/day  145-155 g AA/day   Plan:  - Continue Clinimix E 5/15 at 83 ml/hr, this will provide 1414 kcal (100% of goal) and 100 g AA (69% of goal) - No IVFA day #5 in ICU  - F/u restart of TF - Daily multivitamin and trace elements in TPN - TPN labs Mon/Thurs    AHughes Better PharmD, BCPS Clinical Pharmacist 04/20/2016 9:18 AM

## 2016-04-20 NOTE — Progress Notes (Signed)
Repeat CBC done this AM following critical value of 5.4 for HGB.  Hgb results of 7.9 reported to Dr. Brantley Stage.  Was told to not give the PRBC ordered in response to the first critical low HGB.  Christina Castaneda

## 2016-04-20 NOTE — Evaluation (Signed)
Physical Therapy Evaluation Patient Details Name: Christina Castaneda MRN: [id number] DOB: [birth year] Today's Date: 04/20/2016   History of Present Illness  Pt adm with Necrotic stoma with peritonitis and fascial necrosis after recent colostomy in [place]. Underwent S/p laparotomy, colostomy removal and end stapling of stoma, partial colectomy, omentectomy, debridement of wound--Dr. Hulen Skains, subsequent RUQ stoma and washout, subsequent ABRA placement. Underwent Removal of ABRA device, closure of abdominal wall with retention sutures on 04/18/16. PMH -HTN, gout, obesity, COPD  Clinical Impression  Pt admitted with above diagnosis and presents to PT with functional limitations due to deficits listed below (See PT problem list). Pt needs skilled PT to maximize independence and safety to allow discharge to CIR prior to return home. Pt with lengthy hospitalization resulting in significant decline in function. Pt motivated to work to return to independence.     Follow Up Recommendations CIR    Equipment Recommendations  Other (comment) (To be determined)    Recommendations for Other Services Rehab consult     Precautions / Restrictions Precautions Precautions: Fall      Mobility  Bed Mobility Overal bed mobility: Needs Assistance Bed Mobility: Rolling;Sidelying to Sit;Sit to Sidelying Rolling: Min assist Sidelying to sit: +2 for physical assistance;Max assist     Sit to sidelying: +2 for physical assistance;Max assist General bed mobility comments: Assist for legs and to elevate trunk. Assist to lower trunk and to bring legs back up into bed.  Transfers                    Ambulation/Gait                Stairs            Wheelchair Mobility    Modified Rankin (Stroke Patients Only)       Balance Overall balance assessment: Needs assistance Sitting-balance support: Bilateral upper extremity supported;Feet unsupported Sitting balance-Leahy Scale: Poor Sitting  balance - Comments: Sat EOB x 3 minutes with min A. Pt request to return to supine due to fatigue and anxiety.                                     Pertinent Vitals/Pain      Home Living Family/patient expects to be discharged to:: Private residence Living Arrangements: Spouse/significant other Available Help at Discharge: Family;Available PRN/intermittently Type of Home: House Home Access: Stairs to enter Entrance Stairs-Rails: Right Entrance Stairs-Number of Steps: 2 Home Layout: Two level;Laundry or work area in basement;Able to live on main level with bedroom/bathroom Home Equipment: Environmental consultant - 2 wheels;Bedside commode      Prior Function Level of Independence: Independent               Hand Dominance        Extremity/Trunk Assessment   Upper Extremity Assessment: Defer to OT evaluation           Lower Extremity Assessment: RLE deficits/detail;LLE deficits/detail RLE Deficits / Details: grossly 3-/5 LLE Deficits / Details: grossly 3-/5     Communication   Communication: Tracheostomy;Passy-Muir valve  Cognition Arousal/Alertness: Awake/alert Behavior During Therapy: WFL for tasks assessed/performed Overall Cognitive Status: Within Functional Limits for tasks assessed                      General Comments      Exercises        Assessment/Plan  PT Assessment Patient needs continued PT services  PT Diagnosis Difficulty walking;Generalized weakness   PT Problem List Decreased strength;Decreased activity tolerance;Decreased balance;Decreased mobility;Obesity  PT Treatment Interventions DME instruction;Gait training;Functional mobility training;Therapeutic activities;Therapeutic exercise;Balance training;Patient/family education   PT Goals (Current goals can be found in the Care Plan section) Acute Rehab PT Goals Patient Stated Goal: Return home PT Goal Formulation: With patient Time For Goal Achievement: 05/04/16 Potential to  Achieve Goals: Good    Frequency Min 3X/week   Barriers to discharge        Co-evaluation               End of Session Equipment Utilized During Treatment: Gait belt Activity Tolerance: Patient tolerated treatment well Patient left: in bed;with call bell/phone within reach Nurse Communication: Mobility status;Need for lift equipment         Time: 1447-1511 PT Time Calculation (min) (ACUTE ONLY): 24 min   Charges:   PT Evaluation $PT Eval Moderate Complexity: 1 Procedure     PT G Codes:        Armine Rizzolo 2016-05-16, 3:41 PM [facility] PT [phone]

## 2016-04-20 NOTE — Progress Notes (Signed)
Rehab Admissions Coordinator Note:  Patient was screened by Cleatrice Burke for appropriateness for an Inpatient Acute Rehab Consult per PT, OT, and SLP recommendations.  At this time, we are recommending an inpt rehab consult once on trach collar without needs for vent support to rest.  Cleatrice Burke 04/20/2016, 4:13 PM  I can be reached at [phone].

## 2016-04-20 NOTE — Procedures (Signed)
Objective Swallowing Evaluation: Type of Study: FEES-Fiberoptic Endoscopic Evaluation of Swallow  Patient Details  Name: Christina Castaneda MRN: [id number] Date of Birth: Dec 04, 1956  Today's Date: 04/20/2016 Time: SLP Start Time (ACUTE ONLY): 1324-SLP Stop Time (ACUTE ONLY): 1400 SLP Time Calculation (min) (ACUTE ONLY): 36 min  Past Medical History:  Past Medical History  Diagnosis Date  . Goiter   . Hypothyroidism   . Asthma     mild  . Hypertension   . Atypical endometrial hyperplasia   . Heart murmur   . Vitamin B12 deficiency   . Yeast infection     for last 3 weeks  . Gout   . COPD (chronic obstructive pulmonary disease) ([place])    Past Surgical History:  Past Surgical History  Procedure Laterality Date  . Neck surgery  1994    repair disk  . Tonsillectomy      as a child  . Cholecystectomy  2009    gallstone removed  . Robotic assisted total hysterectomy with bilateral salpingo oopherectomy  11/18/2012    Procedure: ROBOTIC ASSISTED TOTAL HYSTERECTOMY WITH BILATERAL SALPINGO OOPHORECTOMY;  Surgeon: Imagene Gurney A. Alycia Rossetti, MD;  Location: [facility];  Service: Gynecology;  Laterality: N/A;  . Colostomy revision N/A 03/28/2016    Procedure: COLOSTOMY REVISION;  Surgeon: Judeth Horn, MD;  Location: [facility];  Service: General;  Laterality: N/A;  . Laparotomy N/A 03/28/2016    Procedure: EXPLORATORY LAPAROTOMY;  Surgeon: Judeth Horn, MD;  Location: [facility];  Service: General;  Laterality: N/A;  . Omentectomy N/A 03/28/2016    Procedure: OMENTECTOMY;  Surgeon: Judeth Horn, MD;  Location: [facility];  Service: General;  Laterality: N/A;  . Wound debridement N/A 03/28/2016    Procedure: DEBRIDEMENT WOUND;  Surgeon: Judeth Horn, MD;  Location: [facility];  Service: General;  Laterality: N/A;  . Vacuum assisted closure change N/A 03/30/2016    Procedure: ABDOMINAL VAC CHANGE;  Surgeon: Rolm Bookbinder, MD;  Location: [facility];  Service: General;  Laterality: N/A;  . Colostomy N/A 03/30/2016    Procedure: COLOSTOMY;   Surgeon: Rolm Bookbinder, MD;  Location: [facility];  Service: General;  Laterality: N/A;  . Colostomy revision N/A 03/30/2016    Procedure: COLON RESECTION LEFT;  Surgeon: Rolm Bookbinder, MD;  Location: [facility];  Service: General;  Laterality: N/A;  . Laparotomy N/A 04/02/2016    Procedure: Re-exploration of open abdomen, application of abdominal wound vac;  Surgeon: Greer Pickerel, MD;  Location: [facility];  Service: General;  Laterality: N/A;  . Application of wound vac N/A 04/02/2016    Procedure: application of wound vac+;  Surgeon: Greer Pickerel, MD;  Location: [facility];  Service: General;  Laterality: N/A;  . Laparotomy N/A 04/04/2016    Procedure: EXPLORATORY LAPAROTOMY, PLACEMENT OF ABRA ABDOMINAL WALL CLOSURE SET ;  Surgeon: Greer Pickerel, MD;  Location: [facility];  Service: General;  Laterality: N/A;  . Laparotomy N/A 04/18/2016    Procedure: EXPLORATORY LAPAROTOMY;  Surgeon: Erroll Luna, MD;  Location: [facility];  Service: General;  Laterality: N/A;  . Abdominal wall defect repair N/A 04/18/2016    Procedure: CLOSURE OF ABDOMEN;  Surgeon: Erroll Luna, MD;  Location: [facility];  Service: General;  Laterality: N/A;   HPI: 60 yo female had diverticulitis with sigmoid colon resection, appendectomy at [place], [place] on 3/20 and required colostomy.Came home to [place] , [place] Noted to have feculent material from midline wound and present to ER on 4/5. S/p laparotomy, colostomy removal and end stapling of stoma, partial  colectomy, omentectomy, debridement of wound--Dr. Hulen Skains, subsequent ruq stoma and washout, subsequent abra placement by Dr Redmond Pulling. Pt was intubated 4/6-4/13, then trach.   Subjective: pt alert, cooperative, pleasant  Assessment / Plan / Recommendation  CHL IP CLINICAL IMPRESSIONS 04/20/2016  Therapy Diagnosis Mild pharyngeal phase dysphagia  Clinical Impression Pt has a mild pharyngeal dysphagia with a mild delay in swallow trigger and small amounts of vallecular residue with dry solids, which clear with a liquid  wash. No evidence of airway penetration or aspiration observed. Recommend to start with Dys 2 diet for energy conservation and thin liquids. Pt should wear PMSV during all PO intake. Will continue to follow for tolerance with good prognosis for advancement.  Impact on safety and function Mild aspiration risk      CHL IP TREATMENT RECOMMENDATION 04/20/2016  Treatment Recommendations Therapy as outlined in treatment plan below     Prognosis 04/20/2016  Prognosis for Safe Diet Advancement Good  Barriers to Reach Goals --  Barriers/Prognosis Comment --    CHL IP DIET RECOMMENDATION 04/20/2016  SLP Diet Recommendations Dysphagia 2 (Fine chop) solids;Thin liquid  Liquid Administration via Cup;Straw  Medication Administration Whole meds with puree  Compensations Slow rate;Small sips/bites  Postural Changes Remain semi-upright after after feeds/meals (Comment);Seated upright at 90 degrees      CHL IP OTHER RECOMMENDATIONS 04/20/2016  Recommended Consults --  Oral Care Recommendations Oral care BID  Other Recommendations --      CHL IP FOLLOW UP RECOMMENDATIONS 04/20/2016  Follow up Recommendations Inpatient Rehab      CHL IP FREQUENCY AND DURATION 04/20/2016  Speech Therapy Frequency (ACUTE ONLY) min 2x/week  Treatment Duration 2 weeks           CHL IP ORAL PHASE 04/20/2016  Oral Phase WFL  Oral - Pudding Teaspoon --  Oral - Pudding Cup --  Oral - Honey Teaspoon --  Oral - Honey Cup --  Oral - Nectar Teaspoon --  Oral - Nectar Cup --  Oral - Nectar Straw --  Oral - Thin Teaspoon --  Oral - Thin Cup --  Oral - Thin Straw --  Oral - Puree --  Oral - Mech Soft --  Oral - Regular --  Oral - Multi-Consistency --  Oral - Pill --  Oral Phase - Comment --    CHL IP PHARYNGEAL PHASE 04/20/2016  Pharyngeal Phase Impaired  Pharyngeal- Pudding Teaspoon --  Pharyngeal --  Pharyngeal- Pudding Cup --  Pharyngeal --  Pharyngeal- Honey Teaspoon --  Pharyngeal --  Pharyngeal- Honey Cup  --  Pharyngeal --  Pharyngeal- Nectar Teaspoon --  Pharyngeal --  Pharyngeal- Nectar Cup --  Pharyngeal --  Pharyngeal- Nectar Straw --  Pharyngeal --  Pharyngeal- Thin Teaspoon Delayed swallow initiation-vallecula  Pharyngeal --  Pharyngeal- Thin Cup --  Pharyngeal --  Pharyngeal- Thin Straw Delayed swallow initiation-vallecula  Pharyngeal --  Pharyngeal- Puree Delayed swallow initiation-vallecula  Pharyngeal --  Pharyngeal- Mechanical Soft --  Pharyngeal --  Pharyngeal- Regular Delayed swallow initiation-vallecula;Pharyngeal residue - valleculae  Pharyngeal --  Pharyngeal- Multi-consistency --  Pharyngeal --  Pharyngeal- Pill --  Pharyngeal --  Pharyngeal Comment --     CHL IP CERVICAL ESOPHAGEAL PHASE 04/20/2016  Cervical Esophageal Phase WFL  Pudding Teaspoon --  Pudding Cup --  Honey Teaspoon --  Honey Cup --  Nectar Teaspoon --  Nectar Cup --  Nectar Straw --  Thin Teaspoon --  Thin Cup --  Thin Straw --  Puree --  Mechanical Soft --  Regular --  Multi-consistency --  Pill --  Cervical Esophageal Comment --    No flowsheet data found.  Germain Osgood, M.A. CCC-SLP [phone]  Germain Osgood 04/20/2016, 4:13 PM

## 2016-04-20 NOTE — Evaluation (Signed)
Occupational Therapy Evaluation Patient Details Name: Christina Castaneda MRN: [id number] DOB: 05-Jul-1956 Today's Date: 04/20/2016    History of Present Illness Pt adm with Necrotic stoma with peritonitis and fascial necrosis after recent colostomy in [place]. Underwent S/p laparotomy, colostomy removal and end stapling of stoma, partial colectomy, omentectomy, debridement of wound--Dr. Hulen Skains, subsequent RUQ stoma and washout, subsequent ABRA placement. Underwent Removal of ABRA device, closure of abdominal wall with retention sutures on 04/18/16. PMH -HTN, gout, obesity, COPD   Clinical Impression   Pt reports being independent at her baseline. She has had a prolonged hospitalization and bed rest and presents with generalized weakness, decreased activity tolerance, anxiety when seated at EOB and dependence in all ADL. She requires +2 assistance for bed mobility. Pt will need intensive rehab prior to return home. Recommending inpatient rehab. Will follow acutely.    Follow Up Recommendations  CIR    Equipment Recommendations  Tub/shower bench    Recommendations for Other Services       Precautions / Restrictions Precautions Precautions: Fall, Abdominal binder Restrictions Weight Bearing Restrictions: No      Mobility Bed Mobility Overal bed mobility: Needs Assistance Bed Mobility: Rolling;Sidelying to Sit;Sit to Sidelying Rolling: Min assist Sidelying to sit: +2 for physical assistance;Max assist     Sit to sidelying: +2 for physical assistance;Max assist General bed mobility comments: Assist for legs and to elevate trunk. Assist to lower trunk and to bring legs back up into bed.  Transfers                 General transfer comment: deferred this visit    Balance Overall balance assessment: Needs assistance Sitting-balance support: Bilateral upper extremity supported;Feet unsupported Sitting balance-Leahy Scale: Poor Sitting balance - Comments: Sat EOB x 3 minutes  with min A. Pt request to return to supine due to fatigue and anxiety.                                    ADL Overall ADL's : Needs assistance/impaired Eating/Feeding: NPO   Grooming: Wash/dry hands;Wash/dry face;Bed level;Set up   Upper Body Bathing: Total assistance;Bed level   Lower Body Bathing: Total assistance;Bed level   Upper Body Dressing : Bed level;Moderate assistance   Lower Body Dressing: Total assistance;Bed level       Toileting- Clothing Manipulation and Hygiene: Total assistance;Bed level               Vision     Perception     Praxis      Pertinent Vitals/Pain Pain Assessment: Faces Faces Pain Scale: No hurt     Hand Dominance Right   Extremity/Trunk Assessment Upper Extremity Assessment Upper Extremity Assessment: Generalized weakness   Lower Extremity Assessment Lower Extremity Assessment: Defer to PT evaluation RLE Deficits / Details: grossly 3-/5 LLE Deficits / Details: grossly 3-/5       Communication Communication Communication: Tracheostomy;Passy-Muir valve   Cognition Arousal/Alertness: Awake/alert Behavior During Therapy: WFL for tasks assessed/performed;Anxious Overall Cognitive Status: Within Functional Limits for tasks assessed                     General Comments       Exercises       Shoulder Instructions      Home Living Family/patient expects to be discharged to:: Private residence Living Arrangements: Spouse/significant other Available Help at Discharge: Family;Available PRN/intermittently Type of Home: [place]  Access: Stairs to enter [employer] of Steps: 2 Entrance Stairs-Rails: Right Home Layout: Two level;Laundry or work area in basement;Able to live on main level with bedroom/bathroom     Bathroom Shower/Tub: Teacher, early years/pre: [place]: Environmental consultant - 2 wheels;Bedside commode          Prior Functioning/Environment Level of  Independence: Independent             OT Diagnosis: Generalized weakness   OT Problem List: Decreased strength;Decreased activity tolerance;Impaired balance (sitting and/or standing);Decreased knowledge of use of DME or AE;Cardiopulmonary status limiting activity;Obesity;Pain   OT Treatment/Interventions: Self-care/ADL training;Therapeutic exercise;DME and/or AE instruction;Patient/family education;Balance training;Therapeutic activities    OT Goals(Current goals can be found in the care plan section) Acute Rehab OT Goals Patient Stated Goal: Return home OT Goal Formulation: With patient Time For Goal Achievement: 05/04/16 Potential to Achieve Goals: Good ADL Goals Pt Will Perform Grooming: with supervision;with set-up;sitting Pt Will Perform Upper Body Bathing: with min assist;sitting Pt Will Perform Upper Body Dressing: with min assist;sitting Pt Will Transfer to Toilet: with +2 assist;with min assist;bedside commode;stand pivot transfer Pt/caregiver will Perform Home Exercise Program: Increased strength;Both right and left upper extremity;With theraband;With Supervision (level 2) Additional ADL Goal #1: Pt will perform side<>sit with mod assist in preparation for ADL at EOB. Additional ADL Goal #2: Pt will sit EOB x 10 minutes with supervision in preparation for ADL.  OT Frequency: Min 2X/week   Barriers to D/C:            Co-evaluation PT/OT/SLP Co-Evaluation/Treatment: Yes Reason for Co-Treatment: Complexity of the patient's impairments (multi-system involvement);For patient/therapist safety   OT goals addressed during session: Strengthening/ROM      End of Session Equipment Utilized During Treatment: Oxygen  Activity Tolerance: Patient limited by fatigue Patient left: in bed;with call bell/phone within reach   Time: [id number] OT Time Calculation (min): 23 min Charges:  OT General Charges $OT Visit: 1 Procedure OT Evaluation $OT Eval High Complexity: 1  Procedure G-Codes:    Malka So 04/20/2016, 3:51 PM

## 2016-04-20 NOTE — Progress Notes (Signed)
eLink Physician-Brief Progress Note Patient Name: Christina Castaneda DOB: 1956-02-18 MRN: [id number]   Date of Service  04/20/2016  HPI/Events of Note  Hgb drop from 7.9 to 5.7  eICU Interventions  Plan: Transfuse 1 unit pRBC Post-transfusion CBC     Intervention Category Intermediate Interventions: Other:  Chaela Branscum 04/20/2016, 3:53 AMHgb

## 2016-04-20 NOTE — Progress Notes (Signed)
Orthopedic Tech Progress Note Patient Details:  Christina Castaneda Nov 05, 1956 [id number]  Ortho Devices Type of Ortho Device: Abdominal binder Ortho Device/Splint Location: 2 large abdominal binders   [name] 04/20/2016, 8:53 AM

## 2016-04-20 NOTE — Progress Notes (Signed)
2 Days Post-Op  Subjective: Pt awake and alert  In good spirits   Objective: Vital signs in last 24 hours: Temp:  [98.2 F (36.8 C)-99.2 F (37.3 C)] 98.7 F (37.1 C) (04/28 0813) Pulse Rate:  [54-79] 58 (04/28 0734) Resp:  [14-32] 25 (04/28 0734) BP: (98-139)/(45-90) 120/57 mmHg (04/28 0734) SpO2:  [95 %-100 %] 100 % (04/28 0734) FiO2 (%):  [28 %-30 %] 30 % (04/28 0734) Weight:  [144.697 kg (319 lb)] 144.697 kg (319 lb) (04/28 0500) Last BM Date: 04/17/16  Intake/Output from previous day: 04/27 0701 - 04/28 0700 In: 2220 [I.V.:192.5; NG/GT:30; [id number] Out: 3280 [Urine:3180; Stool:100] Intake/Output this shift:    Incision/Wound:wound closed  Maceration from ABRA device noted:  sutures in place : ostomy pink and functioning  Open areas clean   Lab Results:   Recent Labs  04/20/16 0315 04/20/16 0709  WBC 13.2* 10.8*  HGB 5.7* 7.9*  HCT 18.7* 25.9*  PLT 583* 452*   BMET  Recent Labs  04/19/16 0310 04/20/16 0315  NA 133*  132* 130*  K 4.2  4.1 3.9  CL 97*  97* 96*  CO2 28  27 27   GLUCOSE 116*  111* 104*  BUN 24*  24* 22*  CREATININE 0.83  0.82 0.78  CALCIUM 7.7*  7.6* 7.8*   PT/INR No results for input(s): LABPROT, INR in the last 72 hours. ABG No results for input(s): PHART, HCO3 in the last 72 hours.  Invalid input(s): PCO2, PO2  Studies/Results: No results found.  Anti-infectives: Anti-infectives    Start     Dose/Rate Route Frequency Ordered Stop   04/18/16 1000  ciprofloxacin (CIPRO) IVPB 400 mg  Status:  Discontinued     400 mg 200 mL/hr over 60 Minutes Intravenous To Short Stay 04/17/16 1026 04/18/16 1340   04/03/16 2100  vancomycin (VANCOCIN) 1,500 mg in sodium chloride 0.9 % 500 mL IVPB  Status:  Discontinued     1,500 mg 250 mL/hr over 120 Minutes Intravenous Every 12 hours 04/02/16 2020 04/06/16 0943   04/02/16 2030  vancomycin (VANCOCIN) 2,500 mg in sodium chloride 0.9 % 500 mL IVPB     2,500 mg 250 mL/hr over 120  Minutes Intravenous  Once 04/02/16 2020 04/02/16 2306   03/29/16 1200  ciprofloxacin (CIPRO) IVPB 400 mg  Status:  Discontinued     400 mg 200 mL/hr over 60 Minutes Intravenous Every 12 hours 03/29/16 0213 04/06/16 0943   03/29/16 0600  [MAR Hold]  ciprofloxacin (CIPRO) IVPB 400 mg     (MAR Hold since 03/28/16 2357)   400 mg 200 mL/hr over 60 Minutes Intravenous On call to O.R. 03/28/16 2306 03/28/16 2355   03/29/16 0600  [MAR Hold]  metroNIDAZOLE (FLAGYL) IVPB 500 mg     (MAR Hold since 03/28/16 2357)   500 mg 100 mL/hr over 60 Minutes Intravenous On call to O.R. 03/28/16 2306 03/29/16 0025   03/29/16 0600  metroNIDAZOLE (FLAGYL) IVPB 500 mg  Status:  Discontinued     500 mg 100 mL/hr over 60 Minutes Intravenous Every 8 hours 03/29/16 0213 04/06/16 0943   03/28/16 2300  ertapenem (INVANZ) 1 g in sodium chloride 0.9 % 50 mL IVPB  Status:  Discontinued     1 g 100 mL/hr over 30 Minutes Intravenous  Once 03/28/16 2259 03/28/16 2306      Assessment/Plan: s/p Procedure(s): EXPLORATORY LAPAROTOMY (N/A) CLOSURE OF ABDOMEN (N/A)   Open abdomen s/p ABRA placement Hx of Hartman's procedure in [place] secondary  to perforated diverticulitis Abdomen closed Start PT/OT Trach in place  Speech to evaluate swallowing   On TF OOB  today     Patient Active Problem List   Diagnosis Date Noted  . Acute respiratory failure ([place])   . Pressure ulcer 04/01/2016  . 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 Hypothyroid 03/29/2016  . Morbid obesity ([place]) 03/29/2016  . Open wound of abdomen 03/29/2016  . 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 Obesity 11/12/2012    LOS: 23 days    Josh Nicolosi A. 04/20/2016

## 2016-04-20 NOTE — Progress Notes (Signed)
PULMONARY / CRITICAL CARE MEDICINE   Name: Christina Castaneda MRN: [id number] DOB: [birth year]    ADMISSION DATE:  03/28/2016 CONSULTATION DATE:  03/29/2016  REFERRING MD:  [facility]  CHIEF COMPLAINT:  Colostomy bag leaking  60 yo female had diverticulitis with sigmoid colon resection, appendectomy at [place], [place] on 3/20 and required colostomy.Came home to [place] , [place]  Noted to have feculent material from midline wound and present to ER on 4/5. S/p laparotomy, colostomy removal and end stapling of stoma, partial colectomy, omentectomy, debridement of wound--Dr. Hulen Skains, subsequent ruq stoma and washout, subsequent abra placement by Dr Redmond Pulling  MICROBIOLOGY: Blood Ctx x1 4/9>>> Coag neg staph Urine Ctx 4/9>>> Coag neg staph Tracheal Asp Ctx 4/9>>> yeast Blood Ctx x2 4/18>>> negative Urine Ctx 4/5:  Multiple species present MRSA PCR 4/6:  Negative   ANTIBIOTICS: Cipro 4/5>>> off Flagyl 4/5>>>off Vanco 4/11 >>off  LINES/TUBES: ETT 4/6>>> 4/13 R PICC 4/6>>> L RADIAL ART LINE 4/7>>>4/15 #6 cuffed Trach [employer]) 4/13 >> Rt femoral aline 4/15 >>  SIGNIFICANT EVENTS: 3/20 Exp lap appendectomy,removal sigmoid colon, bowel repair in setting diverticulitis. [facility] 4/6 Exp Lap , colostomy removal, partial colectomy,omentectom, wound debridement, open wound with wound vac. 4/10 Start TPN 04/13/16 -  lightened sedation  with open abdomen. Remains critically ill , intubated on  Low dose levo gtt .  on lower doses versed/ fent gtt  4/26 fascia closure & removal of abra device 4/27 ATC daytime   SUBJECTIVE/OVERNIGHT/INTERVAL HX Afebrile Denies pain Tolerated ATC daytime 4/27 Hb drop this am - transfused  VITAL SIGNS: BP 130/68 mmHg  Pulse 61  Temp(Src) 99 F (37.2 C) (Oral)  Resp 20  Ht 5\' 6"  (1.676 m)  Wt 319 lb (144.697 kg)  BMI 51.51 kg/m2  SpO2 99%  LMP 11/18/2012  HEMODYNAMICS: CVP:  [8 mmHg-69 mmHg] 8 mmHg  VENTILATOR SETTINGS: Vent Mode:  [-] PRVC FiO2 (%):  [28 %-30 %] 28 % Set Rate:  [16  bmp] 16 bmp Vt Set:  [550 mL] 550 mL PEEP:  [5 cmH20] 5 cmH20 Plateau Pressure:  [12 cmH20-17 cmH20] 12 cmH20  INTAKE / OUTPUT: I/O last 3 completed shifts: In: 4006 [I.V.:952.5; NG/GT:60] Out: [id number] [Urine:3860; Stool:100]  PHYSICAL EXAMINATION: General: Alert and interactive, obese Neuro: RASS 1  HEENT: trach site clean Cardiac: regular Chest: decreased BS BL. Coarse.  Abd: wound vac, abra closure device  Ext: 1+ edema Skin: no rashes   LABS:  PULMONARY  Recent Labs Lab 04/14/16 0345 04/17/16 0600  PHART 7.480* 7.547*  PCO2ART 47.8* 32.2*  PO2ART 89.0 129*  HCO3 35.7* 27.9*  TCO2 37 28.9  O2SAT 97.0 99.2    CBC  Recent Labs Lab 04/19/16 0310 04/20/16 0315 04/20/16 0709  HGB 7.9* 5.7* 7.9*  HCT 26.1* 18.7* 25.9*  WBC 12.9* 13.2* 10.8*  PLT 456* 583* 452*    COAGULATION No results for input(s): INR in the last 168 hours.  CARDIAC  No results for input(s): TROPONINI in the last 168 hours. No results for input(s): PROBNP in the last 168 hours.   CHEMISTRY  Recent Labs Lab 04/14/16 0405  04/16/16 0335 04/17/16 0532 04/17/16 [id number] 04/18/16 0305 04/19/16 0310 04/20/16 0315  NA 132*  < > 136 123* 131* 133*  133* 133*  132* 130*  K 3.9  < > 3.3* 6.0* 3.4* 3.8  3.8 4.2  4.1 3.9  CL 92*  < > 97* 88* 93* 97*  97* 97*  97* 96*  CO2 30  < > 31  27 27 28  28 28  27 27   GLUCOSE 123*  < > 107* 1209* 108* 107*  110* 116*  111* 104*  BUN 22*  < > 24* 21* 23* 23*  23* 24*  24* 22*  CREATININE 0.85  < > 0.91 0.95 0.84 0.71  0.76 0.83  0.82 0.78  CALCIUM 8.1*  < > 8.2* 7.9* 7.8* 8.0*  8.0* 7.7*  7.6* 7.8*  MG 2.0  --  2.0 2.4  --  2.2 2.1  --   PHOS 3.8  < > 3.6 8.6* 4.0 4.1  4.0 4.1  4.1 3.3  < > = values in this interval not displayed. Estimated Creatinine Clearance: 107.6 mL/min (by C-G formula based on Cr of 0.78).  LIVER  Recent Labs Lab 04/16/16 0335 04/17/16 0637 04/18/16 0305 04/19/16 0310 04/20/16 0315  AST 43*  --   --   54*  --   ALT 20  --   --  35  --   ALKPHOS 69  --   --  91  --   BILITOT 0.2*  --   --  0.3  --   PROT 6.2*  --   --  5.8*  --   ALBUMIN 1.8* 1.7* 1.8* 1.7*  1.7* 1.8*   INFECTIOUS No results for input(s): LATICACIDVEN, PROCALCITON in the last 168 hours.  ENDOCRINE CBG (last 3)   Recent Labs  04/18/16 2342 04/20/16 0346 04/20/16 1236  GLUCAP 116* 99 85   IMAGING x48h  - image(s) personally visualized  -   highlighted in bold No results found. ASSESSMENT / PLAN:  PULMONARY A: Acute resp failure , prolonged ventialtion  s/p tracheostomy. Hx of COPD with asthma, OSA >> wheezing noted 4/14. Tobacco abuse.   P:   ATC trials - goal 24h  Titrate O2 for sat of 88-92%. Brovana, pulmicort with prn albuterol  CARDIOVASCULAR A:  Septic shock -resolved Hx of HTN. Hypervolemia >> great response to lasix 4/15. Echo - nml LV fn, nml RV 4/23 >weaned off pressors , remains in neg bal ~5L   P:  Cont to monitor  RENAL A:   AKI >> resolved. Hypokalemia. Hypomagnesemia. Hypokalemia  P:   Monitor renal fx, urine outpt Replace electrolytes as needed Lasix to 20 q12h for equal balance.   GASTROINTESTINAL A:   Peritonitis s/p laparotomy. Trickle tube feeds as tolerated per [facility] >> held 4/23 due to  Vomiting>KUB w/ no ileus  Protein calorie malnutrition  Morbid obesity P:   Post-op care, TPN per [facility] Protonix for SUP Restart TFs?  HEMATOLOGIC A:   Anemia of critical illness, acute blood loss 4/27  P:  F/u CBC check today. Transfuse for Hb < 7. Lovenox for DVT prevention.  INFECTIOUS A:   Septic shock 2nd to peritonitis and cellulitis of ostomy site. Coag neg Staph bacteremia, UTI- vancomycin  x 7ds, Rpt blood cx shows clearance  Hx of PCN allergy. P:   Cipro, flagyl for peritonitis- stopped   ENDOCRINE A:   Hx of hypothyroidism. P:   Synthroid IV -change to PO soon  NEUROLOGIC A:   Acute metabolic encephalopathy.-improving  Bradycardia from  diprivan. P:   RASS goal: 0  Versed prn fent prn + fentanyl patch    The patient is critically ill with multiple organ systems failure and requires high complexity decision making for assessment and support, frequent evaluation and titration of therapies, application of advanced monitoring technologies and extensive interpretation of multiple databases.   Critical Care  Time devoted to patient care services described in this note is  31  Minutes.  Kara Mead MD. Shade Flood. [facility] Pager [phone] If no response call [phone]     04/20/2016 1:13 PM

## 2016-04-20 NOTE — Progress Notes (Signed)
Speech Language Pathology Treatment: Christina Castaneda Speaking valve  Patient Details Name: Christina Castaneda MRN: [id number] DOB: 1956-10-11 Today's Date: 04/20/2016 Time: 1324-1400 SLP Time Calculation (min) (ACUTE ONLY): 36 min  Assessment / Plan / Recommendation Clinical Impression  SLP deflated cuff and placed PMSV for approximately 30 minutes with no evidence of intolerance. Her voice remains soft, but she can increase her volume with Mod cues for deeper breaths and louder voice. Min cues provided for more effortful cough to clear secretions. Recommend to continue use during the day with full staff supervision. Valve was removed upon SLP departure, although cuff left deflated (RN made aware).   HPI HPI: 60 yo female had diverticulitis with sigmoid colon resection, appendectomy at [facility], [place] on 3/20 and required colostomy.Came home to [place] , [place] Noted to have feculent material from midline wound and present to ER on 4/5. S/p laparotomy, colostomy removal and end stapling of stoma, partial colectomy, omentectomy, debridement of wound--Dr. Hulen Skains, subsequent ruq stoma and washout, subsequent abra placement by Dr Redmond Pulling. Pt was intubated 4/6-4/13, then trach.       SLP Plan  Continue with current plan of care     Recommendations         Patient may use Passy-Muir Speech Valve: Intermittently with supervision;During all therapies with supervision PMSV Supervision: Full MD: Please consider changing trach tube to : Smaller size;Cuffless      Oral Care Recommendations: Oral care BID Follow up Recommendations: Inpatient Rehab Plan: Continue with current plan of care     GO               Germain Osgood, M.A. CCC-SLP [phone]  Germain Osgood 04/20/2016, 3:59 PM

## 2016-04-21 DIAGNOSIS — S31109D Unspecified open wound of abdominal wall, unspecified quadrant without penetration into peritoneal cavity, subsequent encounter: Secondary | ICD-10-CM

## 2016-04-21 LAB — RENAL FUNCTION PANEL
Albumin: 1.7 g/dL — ABNORMAL LOW (ref 3.5–5.0)
Anion gap: 7 (ref 5–15)
BUN: 19 mg/dL (ref 6–20)
CHLORIDE: 97 mmol/L — AB (ref 101–111)
CO2: 29 mmol/L (ref 22–32)
Calcium: 7.9 mg/dL — ABNORMAL LOW (ref 8.9–10.3)
Creatinine, Ser: 0.76 mg/dL (ref 0.44–1.00)
Glucose, Bld: 98 mg/dL (ref 65–99)
POTASSIUM: 4.2 mmol/L (ref 3.5–5.1)
Phosphorus: 3.6 mg/dL (ref 2.5–4.6)
Sodium: 133 mmol/L — ABNORMAL LOW (ref 135–145)

## 2016-04-21 LAB — GLUCOSE, CAPILLARY
Glucose-Capillary: 82 mg/dL (ref 65–99)
Glucose-Capillary: 97 mg/dL (ref 65–99)

## 2016-04-21 LAB — CBC WITH DIFFERENTIAL/PLATELET
Basophils Absolute: 0 10*3/uL (ref 0.0–0.1)
Basophils Relative: 1 %
Eosinophils Absolute: 0.3 10*3/uL (ref 0.0–0.7)
Eosinophils Relative: 3 %
HCT: 27.7 % — ABNORMAL LOW (ref 36.0–46.0)
HEMOGLOBIN: 8.2 g/dL — AB (ref 12.0–15.0)
LYMPHS ABS: 1.9 10*3/uL (ref 0.7–4.0)
LYMPHS PCT: 23 %
MCH: 27.7 pg (ref 26.0–34.0)
MCHC: 29.6 g/dL — ABNORMAL LOW (ref 30.0–36.0)
MCV: 93.6 fL (ref 78.0–100.0)
Monocytes Absolute: 0.8 10*3/uL (ref 0.1–1.0)
Monocytes Relative: 10 %
NEUTROS PCT: 64 %
Neutro Abs: 5.3 10*3/uL (ref 1.7–7.7)
Platelets: 415 10*3/uL — ABNORMAL HIGH (ref 150–400)
RBC: 2.96 MIL/uL — AB (ref 3.87–5.11)
RDW: 17.3 % — ABNORMAL HIGH (ref 11.5–15.5)
WBC: 8.3 10*3/uL (ref 4.0–10.5)

## 2016-04-21 MED ORDER — TRACE MINERALS CR-CU-MN-SE-ZN 10-1000-500-60 MCG/ML IV SOLN
INTRAVENOUS | Status: AC
  Administered 2016-04-21: 18:00:00 via INTRAVENOUS
  Filled 2016-04-21: qty 1992

## 2016-04-21 MED ORDER — ALTEPLASE 2 MG IJ SOLR
2.0000 mg | Freq: Once | INTRAMUSCULAR | Status: AC
  Administered 2016-04-21: 2 mg
  Filled 2016-04-21: qty 2

## 2016-04-21 MED ORDER — CETYLPYRIDINIUM CHLORIDE 0.05 % MT LIQD
7.0000 mL | Freq: Two times a day (BID) | OROMUCOSAL | Status: DC
  Administered 2016-04-21 – 2016-05-02 (×22): 7 mL via OROMUCOSAL

## 2016-04-21 NOTE — Plan of Care (Signed)
Problem: Nutrition: Goal: Adequate nutrition will be maintained Outcome: Completed/Met Date Met:  04/21/16 Pt receiving TNA until pt reaches adequate PO intake.

## 2016-04-21 NOTE — Progress Notes (Signed)
[facility] NOTE  Pharmacy Consult for TPN Indication: Intolerance to EN  Allergies  Allergen Reactions  . Penicillins Anaphylaxis, Hives, Shortness Of Breath, Swelling and Other (See Comments)    Angioedema    Patient Measurements: Height: '5\' 6"'  (167.6 cm) Weight: (!) 319 lb (144.697 kg) IBW/kg (Calculated) : 59.3   Vital Signs: Temp: 98.3 F (36.8 C) (04/29 0359) Temp Source: Oral (04/29 0359) BP: 120/47 mmHg (04/29 0700) Pulse Rate: 59 (04/29 0700) Intake/Output from previous day: 04/28 0701 - 04/29 0700 In: 2073 [P.O.:30; I.V.:240; NG/GT:60; UXN:2355] Out: 2980 [Urine:2980]  Labs:  Recent Labs  04/20/16 0315 04/20/16 0709 04/21/16 0559  WBC 13.2* 10.8* 8.3  HGB 5.7* 7.9* 8.2*  HCT 18.7* 25.9* 27.7*  PLT 583* 452* 415*    Recent Labs  04/19/16 0310 04/20/16 0315 04/21/16 0558  NA 133*  132* 130* 133*  K 4.2  4.1 3.9 4.2  CL 97*  97* 96* 97*  CO2 '28  27 27 29  ' GLUCOSE 116*  111* 104* 98  BUN 24*  24* 22* 19  CREATININE 0.83  0.82 0.78 0.76  CALCIUM 7.7*  7.6* 7.8* 7.9*  MG 2.1  --   --   PHOS 4.1  4.1 3.3 3.6  PROT 5.8*  --   --   ALBUMIN 1.7*  1.7* 1.8* 1.7*  AST 54*  --   --   ALT 35  --   --   ALKPHOS 91  --   --   BILITOT 0.3  --   --    Estimated Creatinine Clearance: 107.6 mL/min (by C-G formula based on Cr of 0.76).    Recent Labs  04/20/16 0346 04/20/16 1236 04/20/16 2203  GLUCAP 99 85 88    Medications:  Scheduled:  . sodium chloride   Intravenous Once  . antiseptic oral rinse  7 mL Mouth Rinse q12n4p  . arformoterol  15 mcg Nebulization BID  . budesonide (PULMICORT) nebulizer solution  0.25 mg Nebulization BID  . chlorhexidine gluconate (SAGE KIT)  15 mL Mouth Rinse BID  . enoxaparin (LOVENOX) injection  40 mg Subcutaneous Q24H  . fentaNYL  100 mcg Transdermal Q72H  . furosemide  20 mg Intravenous Q12H  . levothyroxine  100 mcg Intravenous Daily  . pantoprazole (PROTONIX) IV  40 mg Intravenous  QHS  . sodium chloride flush  10-40 mL Intracatheter Q12H    Insulin Requirements in the past 24 hours:  None  Admission: 61 YOF who was 2 weeks out from emergency colectomy/colostomy and discharged on 03/25/16. Noted to have feculent discharge from midline wound when having staples removed and diagnosed with necrotic stoma, peritonitis, and fascial necrosis. Pharmacy consulted to start TPN due to expected prolonged ileus.   Surgeries/Procedures: ~2 wks pta: emergency colectomy/colostomy (Hartman's) 4/6: colostomy removal and end stapling of stoma, partial colectomy, ex-lap, omentectomy, wound debridement 4/7: lap, VAC change, possible debridement 4/10: Re-exploration of abdomen, Wound VAC change 4/12: Re-exploration of abdomen, placement of abdominal wall closure device 4/13: Trach placed  4/14, 4/17: Wound VAC change 4/24: Wound vac change 4/25 OR tomorrow for wound vac closure 4/26 OR today for closure and removal of ABRA  GI: prealbumin 3.2 > 13 > 16.1.  Drain with 50 mL out yesterday.  PPI-IV. TF were resumed but patient had +N/V. TF have been held but surgery not concerned for obstruction since ostomy has flatus and stool, s/p closure of abdomen yesterday Endo: hypothyroid on IV Synthroid. Gout. No hx DM, cbgs controlled  Lytes: Lytes stable - K 3.9>>4.2, (Mg 2.1,CoCa 9.9, Phos 3.6 on 4/27) Renal: SCr stable, good uop, NS at kvo. Furosemide 22m IV twice daily  Pulm: COPD / asthma. Trached 4/13 Brovana, Pulmicort Cards: HTN - VSS. Lasix q12 Hepatobil: Small change in LFTs, AST 43 > 54, ALT 20 > 35, Tbili wnl, TG 128 Neuro: On trach collar, weaning sedation ID: s/p 10d of Cipro/Flagyl for intra-abd infxn. CoNS in urine and blood (1 of 2). Afebrile, WBC wnl this AM. New blood cx drawn 4/18, ngF.   DVT Prophylaxis - LMWH - may need weight based dose adj. at some point. (Hgb up/down 5.7>7.9>8.2 last 72hr)  Best Practices: Lovenox, PPI IV, MC TPN Access: PICC 03/29/16 TPN  start date: 03/29/16  Current Nutrition:  Vital HP   -- on hold Clinimix E 5/15 at 83 mL/hr   Nutritional Goals: Per RD 4/25 1300-1475 kCal/day  145-155 g AA/day  Plan:  - Continue Clinimix E 5/15 at 83 ml/hr, this will provide 1414 kcal (100% of goal) and 100 g AA (69% of goal) - No IVFA day #6 in ICU  - F/u restart of TF - Daily multivitamin and trace elements in TPN - TPN labs Mon/Thurs  NRober Minion PharmD., MS Clinical Pharmacist Pager:  [phone] you for allowing pharmacy to be part of this patients care team. 04/21/2016 7:15 AM

## 2016-04-21 NOTE — Progress Notes (Signed)
Another IV team nurse came at 06:45, instilled 2mg /54mL of TPA into red port lumen (has orange STOP sticker over cap right now). TPA to instill for 2 hours. White port lumen with Total Parenteral Nutrition infusing. Grey port lumen saline locked and blue-capped d/t inability to flush as of 06:45 (was able to flush earlier).  Passed info on to AM nurse.  Henreitta Leber, RN 7:53 AM 04/21/2016

## 2016-04-21 NOTE — Progress Notes (Signed)
3 Days Post-Op  Subjective: Awake and alert Comfortable   Objective: Vital signs in last 24 hours: Temp:  [98.1 F (36.7 C)-99 F (37.2 C)] 98.2 F (36.8 C) (04/29 0800) Pulse Rate:  [50-68] 56 (04/29 0800) Resp:  [15-29] 19 (04/29 0800) BP: (102-139)/(47-77) 117/59 mmHg (04/29 0800) SpO2:  [94 %-100 %] 100 % (04/29 0800) FiO2 (%):  [28 %] 28 % (04/29 0400) Last BM Date: 04/20/16  Intake/Output from previous day: 04/28 0701 - 04/29 0700 In: 2352 [P.O.:30; I.V.:270; NG/GT:60; TPN:1992] Out: 3255 [Urine:3255] Intake/Output this shift: Total I/O In: 93 [I.V.:10; TPN:83] Out: 200 [Urine:200]  Awake Trach site ok Lungs clear Abdomen soft Ostomy working well  Lab Results:   Recent Labs  04/20/16 0709 04/21/16 0559  WBC 10.8* 8.3  HGB 7.9* 8.2*  HCT 25.9* 27.7*  PLT 452* 415*   BMET  Recent Labs  04/20/16 0315 04/21/16 0558  NA 130* 133*  K 3.9 4.2  CL 96* 97*  CO2 27 29  GLUCOSE 104* 98  BUN 22* 19  CREATININE 0.78 0.76  CALCIUM 7.8* 7.9*   PT/INR No results for input(s): LABPROT, INR in the last 72 hours. ABG No results for input(s): PHART, HCO3 in the last 72 hours.  Invalid input(s): PCO2, PO2  Studies/Results: No results found.  Anti-infectives: Anti-infectives    Start     Dose/Rate Route Frequency Ordered Stop   04/18/16 1000  ciprofloxacin (CIPRO) IVPB 400 mg  Status:  Discontinued     400 mg 200 mL/hr over 60 Minutes Intravenous To Short Stay 04/17/16 1026 04/18/16 1340   04/03/16 2100  vancomycin (VANCOCIN) 1,500 mg in sodium chloride 0.9 % 500 mL IVPB  Status:  Discontinued     1,500 mg 250 mL/hr over 120 Minutes Intravenous Every 12 hours 04/02/16 2020 04/06/16 0943   04/02/16 2030  vancomycin (VANCOCIN) 2,500 mg in sodium chloride 0.9 % 500 mL IVPB     2,500 mg 250 mL/hr over 120 Minutes Intravenous  Once 04/02/16 2020 04/02/16 2306   03/29/16 1200  ciprofloxacin (CIPRO) IVPB 400 mg  Status:  Discontinued     400 mg 200 mL/hr  over 60 Minutes Intravenous Every 12 hours 03/29/16 0213 04/06/16 0943   03/29/16 0600  [MAR Hold]  ciprofloxacin (CIPRO) IVPB 400 mg     (MAR Hold since 03/28/16 2357)   400 mg 200 mL/hr over 60 Minutes Intravenous On call to O.R. 03/28/16 2306 03/28/16 2355   03/29/16 0600  [MAR Hold]  metroNIDAZOLE (FLAGYL) IVPB 500 mg     (MAR Hold since 03/28/16 2357)   500 mg 100 mL/hr over 60 Minutes Intravenous On call to O.R. 03/28/16 2306 03/29/16 0025   03/29/16 0600  metroNIDAZOLE (FLAGYL) IVPB 500 mg  Status:  Discontinued     500 mg 100 mL/hr over 60 Minutes Intravenous Every 8 hours 03/29/16 0213 04/06/16 0943   03/28/16 2300  ertapenem (INVANZ) 1 g in sodium chloride 0.9 % 50 mL IVPB  Status:  Discontinued     1 g 100 mL/hr over 30 Minutes Intravenous  Once 03/28/16 2259 03/28/16 2306      Assessment/Plan: s/p Procedure(s): EXPLORATORY LAPAROTOMY (N/Castaneda) CLOSURE OF ABDOMEN (N/Castaneda)  Continuing current care ?resuming tube feeds Ok for trach change from surgery standpoint per speech path recommendations  LOS: 24 days    Christina Castaneda 04/21/2016

## 2016-04-21 NOTE — Progress Notes (Signed)
PICC line not drawing back blood now (did at start of shift 1900 4/28). Flushed all three lumens and repositioned arm, but still did not draw back at all. Fluids go in fine, lines just won't pull back.   Put in IV team consult to assess problem and called Phlebotomy at 05:16 to come draw her labs for this AM so they won't be delayed.  Will continue to monitor. Explained to pt the plan, verbalized understanding and acceptance.  Henreitta Leber, RN 5:17 AM 04/21/2016

## 2016-04-21 NOTE — Progress Notes (Signed)
PULMONARY / CRITICAL CARE MEDICINE   Name: Christina Castaneda MRN: [id number] DOB: 24-May-1956    ADMISSION DATE:  03/28/2016 CONSULTATION DATE:  03/29/2016  REFERRING MD:  CCS  CHIEF COMPLAINT:  Colostomy bag leaking  60 yo female had diverticulitis with sigmoid colon resection, appendectomy at [facility] [place], [place] on 3/20 and required colostomy.Came home to [place] , [place]  Noted to have feculent material from midline wound and present to ER on 4/5. S/p laparotomy, colostomy removal and end stapling of stoma, partial colectomy, omentectomy, debridement of wound--Dr. Hulen Skains, subsequent ruq stoma and washout, subsequent abra placement by Dr Redmond Pulling  MICROBIOLOGY: Blood Ctx x1 4/9>>> Coag neg staph Urine Ctx 4/9>>> Coag neg staph Tracheal Asp Ctx 4/9>>> yeast Blood Ctx x2 4/18>>> negative Urine Ctx 4/5:  Multiple species present MRSA PCR 4/6:  Negative   ANTIBIOTICS: Cipro 4/5>>> off Flagyl 4/5>>>off Vanco 4/11 >>off  LINES/TUBES: ETT 4/6>>> 4/13 R PICC 4/6>>> L RADIAL ART LINE 4/7>>>4/15 #6 cuffed Trach [employer]) 4/13 >> Rt femoral aline 4/15 >> out   SIGNIFICANT EVENTS: 3/20 Exp lap appendectomy,removal sigmoid colon, bowel repair in setting diverticulitis. [facility] 4/6 Exp Lap , colostomy removal, partial colectomy,omentectom, wound debridement, open wound with wound vac. 4/10 Start TPN 04/13/16 -  lightened sedation  with open abdomen. Remains critically ill , intubated on  Low dose levo gtt .  on lower doses versed/ fent gtt  4/26 fascia closure & removal of abra device 4/27 ATC daytime > continued 4/29   SUBJECTIVE/OVERNIGHT/INTERVAL HX ATC since yesterday morning No acute events  VITAL SIGNS: BP 117/59 mmHg  Pulse 56  Temp(Src) 98.2 F (36.8 C) (Oral)  Resp 18  Ht 5\' 6"  (1.676 m)  Wt 144.697 kg (319 lb)  BMI 51.51 kg/m2  SpO2 100%  LMP 11/18/2012  HEMODYNAMICS: CVP:  [4 mmHg-8 mmHg] 5 mmHg  VENTILATOR SETTINGS: Vent Mode:  [-]  FiO2 (%):  [28 %] 28 %  INTAKE / OUTPUT: I/O last 3  completed shifts: In: 3838 [P.O.:30; I.V.:730; NG/GT:90] Out: 4955 [Urine:4855; Stool:100]  PHYSICAL EXAMINATION: General: Alert and interactive, obese Neuro: no distress HEENT: trach site clean Cardiac: regular Chest: CTA B Abd: wound vac,  Ext: trace edema Skin: no rash   LABS:  PULMONARY  Recent Labs Lab 04/17/16 0600  PHART 7.547*  PCO2ART 32.2*  PO2ART 129*  HCO3 27.9*  TCO2 28.9  O2SAT 99.2    CBC  Recent Labs Lab 04/20/16 0315 04/20/16 0709 04/21/16 0559  HGB 5.7* 7.9* 8.2*  HCT 18.7* 25.9* 27.7*  WBC 13.2* 10.8* 8.3  PLT 583* 452* 415*    COAGULATION No results for input(s): INR in the last 168 hours.  CARDIAC  No results for input(s): TROPONINI in the last 168 hours. No results for input(s): PROBNP in the last 168 hours.   CHEMISTRY  Recent Labs Lab 04/16/16 0335 04/17/16 0532 04/17/16 [id number] 04/18/16 0305 04/19/16 0310 04/20/16 0315 04/21/16 0558  NA 136 123* 131* 133*  133* 133*  132* 130* 133*  K 3.3* 6.0* 3.4* 3.8  3.8 4.2  4.1 3.9 4.2  CL 97* 88* 93* 97*  97* 97*  97* 96* 97*  CO2 31 27 27 28  28 28  27 27 29   GLUCOSE 107* 1209* 108* 107*  110* 116*  111* 104* 98  BUN 24* 21* 23* 23*  23* 24*  24* 22* 19  CREATININE 0.91 0.95 0.84 0.71  0.76 0.83  0.82 0.78 0.76  CALCIUM 8.2* 7.9* 7.8* 8.0*  8.0* 7.7*  7.6* 7.8* 7.9*  MG 2.0 2.4  --  2.2 2.1  --   --   PHOS 3.6 8.6* 4.0 4.1  4.0 4.1  4.1 3.3 3.6   Estimated Creatinine Clearance: 107.6 mL/min (by C-G formula based on Cr of 0.76).  LIVER  Recent Labs Lab 04/16/16 0335 04/17/16 [id number] 04/18/16 0305 04/19/16 0310 04/20/16 0315 04/21/16 0558  AST 43*  --   --  54*  --   --   ALT 20  --   --  35  --   --   ALKPHOS 69  --   --  91  --   --   BILITOT 0.2*  --   --  0.3  --   --   PROT 6.2*  --   --  5.8*  --   --   ALBUMIN 1.8* 1.7* 1.8* 1.7*  1.7* 1.8* 1.7*   INFECTIOUS No results for input(s): LATICACIDVEN, PROCALCITON in the last 168  hours.  ENDOCRINE CBG (last 3)   Recent Labs  04/20/16 0346 04/20/16 1236 04/20/16 2203  GLUCAP 99 85 88   IMAGING x48h  - image(s) personally visualized  -   highlighted in bold No results found. ASSESSMENT / PLAN:  PULMONARY A: Acute resp failure , prolonged ventialtion  s/p tracheostomy> now doing well with trach collar trials Hx of COPD with asthma, OSA >> wheezing noted 4/14 Tobacco abuse   P:   ATC trials > continue Downsize tracheostomy tomorrow if tolerates ATC all day Titrate O2 for sat of 88-92%. Brovana, pulmicort with prn albuterol  CARDIOVASCULAR A:  Septic shock -resolved Hx of HTN Echo - nml LV fn, nml RV  P:  Cont to monitor  RENAL A:   AKI >> resolved Hypokalemia Hypomagnesemia Hypokalemia  P:   Continue lasix for now Monitor BMET and UOP Replace electrolytes as needed   GASTROINTESTINAL A:   Peritonitis s/p laparotomy Trickle tube feeds as tolerated per CCS >> held 4/23 due to  Vomiting>KUB w/ no ileus  Protein calorie malnutrition  Morbid obesity P:   Post-op care, nutrition per CCS Protonix for SUP  HEMATOLOGIC A:   Anemia of critical illness, acute blood loss 4/27  P:  Transfuse for Hb < 7 Lovenox for DVT prevention  INFECTIOUS A:   Septic shock 2nd to peritonitis and cellulitis of ostomy site> resolved Coag neg Staph bacteremia, UTI- vancomycin  x 7ds, Rpt blood cx shows clearance  Hx of PCN allergy P:   Monitor off of antibiotics   ENDOCRINE A:   Hx of hypothyroidism P:   Synthroid IV -change to PO soon  NEUROLOGIC A:   Acute metabolic encephalopathy.-improving  P:   RASS goal: 0  D/c versed fent prn + fentanyl patch   Roselie Awkward, MD [place] [facility] Pager: [phone] Cell: [phone] After 3pm or if no response, call [phone]     04/21/2016 10:30 AM

## 2016-04-22 LAB — CBC WITH DIFFERENTIAL/PLATELET
BASOS PCT: 0 %
Basophils Absolute: 0 10*3/uL (ref 0.0–0.1)
EOS ABS: 0.4 10*3/uL (ref 0.0–0.7)
Eosinophils Relative: 4 %
HCT: 28 % — ABNORMAL LOW (ref 36.0–46.0)
HEMOGLOBIN: 8.6 g/dL — AB (ref 12.0–15.0)
Lymphocytes Relative: 27 %
Lymphs Abs: 2.2 10*3/uL (ref 0.7–4.0)
MCH: 28.7 pg (ref 26.0–34.0)
MCHC: 30.7 g/dL (ref 30.0–36.0)
MCV: 93.3 fL (ref 78.0–100.0)
MONOS PCT: 9 %
Monocytes Absolute: 0.7 10*3/uL (ref 0.1–1.0)
NEUTROS PCT: 60 %
Neutro Abs: 4.8 10*3/uL (ref 1.7–7.7)
Platelets: 410 10*3/uL — ABNORMAL HIGH (ref 150–400)
RBC: 3 MIL/uL — ABNORMAL LOW (ref 3.87–5.11)
RDW: 17.1 % — ABNORMAL HIGH (ref 11.5–15.5)
WBC: 8.1 10*3/uL (ref 4.0–10.5)

## 2016-04-22 LAB — RENAL FUNCTION PANEL
ALBUMIN: 1.8 g/dL — AB (ref 3.5–5.0)
Anion gap: 6 (ref 5–15)
BUN: 21 mg/dL — ABNORMAL HIGH (ref 6–20)
CALCIUM: 7.9 mg/dL — AB (ref 8.9–10.3)
CO2: 29 mmol/L (ref 22–32)
CREATININE: 0.76 mg/dL (ref 0.44–1.00)
Chloride: 95 mmol/L — ABNORMAL LOW (ref 101–111)
Glucose, Bld: 101 mg/dL — ABNORMAL HIGH (ref 65–99)
PHOSPHORUS: 3.6 mg/dL (ref 2.5–4.6)
Potassium: 4 mmol/L (ref 3.5–5.1)
SODIUM: 130 mmol/L — AB (ref 135–145)

## 2016-04-22 LAB — GLUCOSE, CAPILLARY
GLUCOSE-CAPILLARY: 97 mg/dL (ref 65–99)
Glucose-Capillary: 95 mg/dL (ref 65–99)
Glucose-Capillary: 104 mg/dL — ABNORMAL HIGH (ref 65–99)

## 2016-04-22 MED ORDER — TRACE MINERALS CR-CU-MN-SE-ZN 10-1000-500-60 MCG/ML IV SOLN
INTRAVENOUS | Status: AC
  Administered 2016-04-22: 18:00:00 via INTRAVENOUS
  Filled 2016-04-22: qty 1992

## 2016-04-22 NOTE — Progress Notes (Signed)
[place] NOTE  Pharmacy Consult for TPN Indication: Intolerance to EN  Please consider trial of trickle feeds if you feel appropriate at this time   Allergies  Allergen Reactions  . Penicillins Anaphylaxis, Hives, Shortness Of Breath, Swelling and Other (See Comments)    Angioedema   Patient Measurements: Height: _0  (167.6 cm) Weight: (!) 319 lb (144.697 kg) IBW/kg (Calculated) : 59.3  Vital Signs: Temp: 98.5 F (36.9 C) (04/30 0400) Temp Source: Oral (04/30 0400) BP: 127/67 mmHg (04/30 0700) Pulse Rate: 55 (04/30 0700) Intake/Output from previous day: 04/29 0701 - 04/30 0700 In: 2232 [I.V.:240; TPN:1992] Out: 4670 [TZGYF:7494; Stool:25]  Labs:  Recent Labs  04/20/16 0709 04/21/16 0559 04/22/16 0410  WBC 10.8* 8.3 8.1  HGB 7.9* 8.2* 8.6*  HCT 25.9* 27.7* 28.0*  PLT 452* 415* 410*    Recent Labs  04/20/16 0315 04/21/16 0558 04/22/16 0410  NA 130* 133* 130*  K 3.9 4.2 4.0  CL 96* 97* 95*  CO2 _1 GLUCOSE 104* 98 101*  BUN 22* 19 21*  CREATININE 0.78 0.76 0.76  CALCIUM 7.8* 7.9* 7.9*  PHOS 3.3 3.6 3.6  ALBUMIN 1.8* 1.7* 1.8*   Estimated Creatinine Clearance: 107.6 mL/min (by C-G formula based on Cr of 0.76).    Recent Labs  04/21/16 1144 04/21/16 2211 04/22/16 0046  GLUCAP 82 97 97   Medications:  Scheduled:  . sodium chloride   Intravenous Once  . antiseptic oral rinse  7 mL Mouth Rinse q12n4p  . arformoterol  15 mcg Nebulization BID  . budesonide (PULMICORT) nebulizer solution  0.25 mg Nebulization BID  . chlorhexidine gluconate (SAGE KIT)  15 mL Mouth Rinse BID  . enoxaparin (LOVENOX) injection  40 mg Subcutaneous Q24H  . fentaNYL  100 mcg Transdermal Q72H  . furosemide  20 mg Intravenous Q12H  . levothyroxine  100 mcg Intravenous Daily  . pantoprazole (PROTONIX) IV  40 mg Intravenous QHS  . sodium chloride flush  10-40 mL Intracatheter Q12H   Insulin Requirements in the past 24 hours:   None  Admission: 69 YOF who was 2 weeks out from emergency colectomy/colostomy and discharged on 03/25/16. Noted to have feculent discharge from midline wound when having staples removed and diagnosed with necrotic stoma, peritonitis, and fascial necrosis. Pharmacy consulted to start TPN due to expected prolonged ileus.   Surgeries/Procedures: ~2 wks pta: emergency colectomy/colostomy (Hartman's) 4/6: colostomy removal and end stapling of stoma, partial colectomy, ex-lap, omentectomy, wound debridement 4/7: lap, VAC change, possible debridement 4/10: Re-exploration of abdomen, Wound VAC change 4/12: Re-exploration of abdomen, placement of abdominal wall closure device 4/13: Trach placed  4/14, 4/17: Wound VAC change 4/24: Wound vac change 4/25 OR tomorrow for wound vac closure 4/26 OR today for closure and removal of ABRA  GI: prealbumin 3.2 > 13 > 16.1.  Drain with 50 mL out yesterday.  PPI-IV. TF were resumed but patient had +N/V. TF have been held but surgery not concerned for obstruction since ostomy has flatus and stool, s/p closure of abdomen yesterday Endo: hypothyroid on IV Synthroid. Gout. No hx DM, cbgs controlled Lytes: Lytes stable - K 3.9>>4.0, (Mg 2.1,CoCa 9.9, Phos 3.6 on 4/27) Renal: SCr stable, good uop, NS at kvo. Furosemide 19m IV twice daily  Pulm: COPD / asthma. Trached 4/13 Brovana, Pulmicort Cards: HTN - VSS. Lasix q12 Hepatobil: Small change in LFTs, AST 43 > 54, ALT 20 > 35, Tbili wnl, TG 128 Neuro: On trach collar, weaning sedation  ID: s/p 10d of Cipro/Flagyl for intra-abd infxn. CoNS in urine and blood (1 of 2). Afebrile, WBC wnl this AM. New blood cx drawn 4/18, ngF.   DVT Prophylaxis - LMWH - may need weight based dose adj. at some point. (Hgb up/down 5.7>7.9>8.2 last 72hr)  Best Practices: Lovenox, PPI IV, MC TPN Access: PICC 03/29/16 TPN start date: 03/29/16  Current Nutrition:  Vital HP   -- on hold Clinimix E 5/15 at 83 mL/hr   Nutritional  Goals: Per RD 4/25 1300-1475 kCal/day  145-155 g AA/day  Plan:  - Continue Clinimix E 5/15 at 83 ml/hr, this will provide 1414 kcal (100% of goal) and 100 g AA (69% of goal) - No IVFA day #7 in ICU  - F/u restart of TF - Daily multivitamin and trace elements in TPN - TPN labs Mon/Thurs  Rober Minion, PharmD., MS Clinical Pharmacist Pager:  [phone] Thank you for allowing pharmacy to be part of this patients care team. 04/22/2016 7:42 AM

## 2016-04-22 NOTE — Progress Notes (Signed)
Progress Note: General Surgery Service   Subjective: Tolerated some oral food yesterday, no issues overnight, continues on trach collar  Objective: Vital signs in last 24 hours: Temp:  [98.1 F (36.7 C)-98.9 F (37.2 C)] 98.5 F (36.9 C) (04/30 0400) Pulse Rate:  [49-64] 55 (04/30 0700) Resp:  [16-22] 17 (04/30 0700) BP: (99-139)/(46-77) 127/67 mmHg (04/30 0700) SpO2:  [97 %-100 %] 100 % (04/30 0748) FiO2 (%):  [28 %] 28 % (04/30 0748) Last BM Date: 04/20/16  Intake/Output from previous day: 04/29 0701 - 04/30 0700 In: 2232 [I.V.:240; JKD:3267] Out: 1245 [YKDXI:3382; Stool:25] Intake/Output this shift:    Lungs: CTAB  Cardiovascular: RRR  Abd: soft, with obesity, retention sutures in place, no erythema  Extremities: moves all extremities  Neuro: GCS 11T  Lab Results: CBC   Recent Labs  04/21/16 0559 04/22/16 0410  WBC 8.3 8.1  HGB 8.2* 8.6*  HCT 27.7* 28.0*  PLT 415* 410*   BMET  Recent Labs  04/21/16 0558 04/22/16 0410  NA 133* 130*  K 4.2 4.0  CL 97* 95*  CO2 29 29  GLUCOSE 98 101*  BUN 19 21*  CREATININE 0.76 0.76  CALCIUM 7.9* 7.9*   PT/INR No results for input(s): LABPROT, INR in the last 72 hours. ABG No results for input(s): PHART, HCO3 in the last 72 hours.  Invalid input(s): PCO2, PO2  Studies/Results:  Anti-infectives: Anti-infectives    Start     Dose/Rate Route Frequency Ordered Stop   04/18/16 1000  ciprofloxacin (CIPRO) IVPB 400 mg  Status:  Discontinued     400 mg 200 mL/hr over 60 Minutes Intravenous To Short Stay 04/17/16 1026 04/18/16 1340   04/03/16 2100  vancomycin (VANCOCIN) 1,500 mg in sodium chloride 0.9 % 500 mL IVPB  Status:  Discontinued     1,500 mg 250 mL/hr over 120 Minutes Intravenous Every 12 hours 04/02/16 2020 04/06/16 0943   04/02/16 2030  vancomycin (VANCOCIN) 2,500 mg in sodium chloride 0.9 % 500 mL IVPB     2,500 mg 250 mL/hr over 120 Minutes Intravenous  Once 04/02/16 2020 04/02/16 2306   03/29/16 1200  ciprofloxacin (CIPRO) IVPB 400 mg  Status:  Discontinued     400 mg 200 mL/hr over 60 Minutes Intravenous Every 12 hours 03/29/16 0213 04/06/16 0943   03/29/16 0600  [MAR Hold]  ciprofloxacin (CIPRO) IVPB 400 mg     (MAR Hold since 03/28/16 2357)   400 mg 200 mL/hr over 60 Minutes Intravenous On call to O.R. 03/28/16 2306 03/28/16 2355   03/29/16 0600  [MAR Hold]  metroNIDAZOLE (FLAGYL) IVPB 500 mg     (MAR Hold since 03/28/16 2357)   500 mg 100 mL/hr over 60 Minutes Intravenous On call to O.R. 03/28/16 2306 03/29/16 0025   03/29/16 0600  metroNIDAZOLE (FLAGYL) IVPB 500 mg  Status:  Discontinued     500 mg 100 mL/hr over 60 Minutes Intravenous Every 8 hours 03/29/16 0213 04/06/16 0943   03/28/16 2300  ertapenem (INVANZ) 1 g in sodium chloride 0.9 % 50 mL IVPB  Status:  Discontinued     1 g 100 mL/hr over 30 Minutes Intravenous  Once 03/28/16 2259 03/28/16 2306      Medications: Scheduled Meds: . sodium chloride   Intravenous Once  . antiseptic oral rinse  7 mL Mouth Rinse q12n4p  . arformoterol  15 mcg Nebulization BID  . budesonide (PULMICORT) nebulizer solution  0.25 mg Nebulization BID  . chlorhexidine gluconate (SAGE KIT)  15  mL Mouth Rinse BID  . enoxaparin (LOVENOX) injection  40 mg Subcutaneous Q24H  . fentaNYL  100 mcg Transdermal Q72H  . furosemide  20 mg Intravenous Q12H  . levothyroxine  100 mcg Intravenous Daily  . pantoprazole (PROTONIX) IV  40 mg Intravenous QHS  . sodium chloride flush  10-40 mL Intracatheter Q12H   Continuous Infusions: . Marland KitchenTPN (CLINIMIX-E) Adult 83 mL/hr at 04/21/16 1810  . Marland KitchenTPN (CLINIMIX-E) Adult    . sodium chloride 10 mL/hr at 04/22/16 0700   PRN Meds:.acetaminophen (TYLENOL) oral liquid 160 mg/5 mL, albuterol, fentaNYL (SUBLIMAZE) injection, iopamidol, [DISCONTINUED] ondansetron **OR** ondansetron (ZOFRAN) IV, sodium chloride flush  Assessment/Plan: Patient Active Problem List   Diagnosis Date Noted  . Acute respiratory  failure ([place])   . Pressure ulcer 04/01/2016  . 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 Hypothyroid 03/29/2016  . Morbid obesity ([place]) 03/29/2016  . Open wound of abdomen 03/29/2016  . Infection of colostomy stoma ([place]) 03/28/2016  . 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 Obesity 11/12/2012   s/p Procedure(s): EXPLORATORY LAPAROTOMY CLOSURE OF ABDOMEN 04/18/2016 -continue oral food, if unable to tolerate, will plan on tube feeds -increase physical activity -continue pain control   LOS: 25 days   Mickeal Skinner, MD Pg# [phone] [facility]

## 2016-04-22 NOTE — Progress Notes (Signed)
PULMONARY / CRITICAL CARE MEDICINE   Name: Christina Castaneda MRN: [id number] DOB: Jan 27, 1956    ADMISSION DATE:  03/28/2016 CONSULTATION DATE:  03/29/2016  REFERRING MD:  CCS  CHIEF COMPLAINT:  Colostomy bag leaking  60 yo female had diverticulitis with sigmoid colon resection, appendectomy at [place], [place] on 3/20 and required colostomy.Came home to [place] , [place]  Noted to have feculent material from midline wound and present to ER on 4/5. S/p laparotomy, colostomy removal and end stapling of stoma, partial colectomy, omentectomy, debridement of wound--Dr. Hulen Skains, subsequent ruq stoma and washout, subsequent abra placement by Dr Redmond Pulling  MICROBIOLOGY: Blood Ctx x1 4/9>>> Coag neg staph Urine Ctx 4/9>>> Coag neg staph Tracheal Asp Ctx 4/9>>> yeast Blood Ctx x2 4/18>>> negative Urine Ctx 4/5:  Multiple species present MRSA PCR 4/6:  Negative   ANTIBIOTICS: Cipro 4/5>>> off Flagyl 4/5>>>off Vanco 4/11 >>off  LINES/TUBES: ETT 4/6>>> 4/13 R PICC 4/6>>> L RADIAL ART LINE 4/7>>>4/15 #6 cuffed Trach [employer]) 4/13 >> Rt femoral aline 4/15 >> out   SIGNIFICANT EVENTS: 3/20 Exp lap appendectomy,removal sigmoid colon, bowel repair in setting diverticulitis. [facility] 4/6 Exp Lap , colostomy removal, partial colectomy,omentectom, wound debridement, open wound with wound vac. 4/10 Start TPN 04/13/16 -  lightened sedation  with open abdomen. Remains critically ill , intubated on  Low dose levo gtt .  on lower doses versed/ fent gtt  4/26 fascia closure & removal of abra device 4/27 ATC daytime > continued 4/29   SUBJECTIVE/OVERNIGHT/INTERVAL HX ATC since 4/28 AM Says she feels a little short of breath with speaking valve  VITAL SIGNS: BP 135/62 mmHg  Pulse 62  Temp(Src) 98.6 F (37 C) (Oral)  Resp 20  Ht 5\' 6"  (1.676 m)  Wt 144.697 kg (319 lb)  BMI 51.51 kg/m2  SpO2 99%  LMP 11/18/2012  HEMODYNAMICS: CVP:  [5 mmHg-7 mmHg] 7 mmHg  VENTILATOR SETTINGS: Vent Mode:  [-]  FiO2 (%):  [28 %] 28  %  INTAKE / OUTPUT: I/O last 3 completed shifts: In: 3408 [P.O.:30; I.V.:390] Out: 6065 [Urine:6040; Stool:25]  PHYSICAL EXAMINATION: General: Alert and interactive, obese Neuro: alert and oriented HEENT: trach site clean Cardiac: RRR, no mgr Chest: CTA B Abd: infrequent bowel sounds, abdominal dressing in place Ext: trace edema Skin: no rash   LABS:  PULMONARY  Recent Labs Lab 04/17/16 0600  PHART 7.547*  PCO2ART 32.2*  PO2ART 129*  HCO3 27.9*  TCO2 28.9  O2SAT 99.2    CBC  Recent Labs Lab 04/20/16 0709 04/21/16 0559 04/22/16 0410  HGB 7.9* 8.2* 8.6*  HCT 25.9* 27.7* 28.0*  WBC 10.8* 8.3 8.1  PLT 452* 415* 410*    COAGULATION No results for input(s): INR in the last 168 hours.  CARDIAC  No results for input(s): TROPONINI in the last 168 hours. No results for input(s): PROBNP in the last 168 hours.   CHEMISTRY  Recent Labs Lab 04/16/16 0335 04/17/16 0532  04/18/16 0305 04/19/16 0310 04/20/16 0315 04/21/16 0558 04/22/16 0410  NA 136 123*  < > 133*  133* 133*  132* 130* 133* 130*  K 3.3* 6.0*  < > 3.8  3.8 4.2  4.1 3.9 4.2 4.0  CL 97* 88*  < > 97*  97* 97*  97* 96* 97* 95*  CO2 31 27  < > 28  28 28  27 27 29 29   GLUCOSE 107* 1209*  < > 107*  110* 116*  111* 104* 98 101*  BUN 24* 21*  < >  23*  23* 24*  24* 22* 19 21*  CREATININE 0.91 0.95  < > 0.71  0.76 0.83  0.82 0.78 0.76 0.76  CALCIUM 8.2* 7.9*  < > 8.0*  8.0* 7.7*  7.6* 7.8* 7.9* 7.9*  MG 2.0 2.4  --  2.2 2.1  --   --   --   PHOS 3.6 8.6*  < > 4.1  4.0 4.1  4.1 3.3 3.6 3.6  < > = values in this interval not displayed. Estimated Creatinine Clearance: 107.6 mL/min (by C-G formula based on Cr of 0.76).  LIVER  Recent Labs Lab 04/16/16 0335  04/18/16 0305 04/19/16 0310 04/20/16 0315 04/21/16 0558 04/22/16 0410  AST 43*  --   --  54*  --   --   --   ALT 20  --   --  35  --   --   --   ALKPHOS 69  --   --  91  --   --   --   BILITOT 0.2*  --   --  0.3  --   --    --   PROT 6.2*  --   --  5.8*  --   --   --   ALBUMIN 1.8*  < > 1.8* 1.7*  1.7* 1.8* 1.7* 1.8*  < > = values in this interval not displayed. INFECTIOUS No results for input(s): LATICACIDVEN, PROCALCITON in the last 168 hours.  ENDOCRINE CBG (last 3)   Recent Labs  04/21/16 2211 04/22/16 0046 04/22/16 1134  GLUCAP 97 97 95   IMAGING x48h  - image(s) personally visualized  -   highlighted in bold No results found. ASSESSMENT / PLAN:  PULMONARY A: Acute resp failure , prolonged ventialtion  s/p tracheostomy> now doing well with trach collar trials Hx of COPD with asthma, OSA >> wheezing noted 4/14 Tobacco abuse   P:   Continue trach collar Downsize tracheostomy 5/1 to facilitate breathing with speaking valve Titrate O2 for sat of 88-92%. Brovana, pulmicort with prn albuterol  CARDIOVASCULAR A:  Septic shock -resolved Hx of HTN Echo - nml LV fn, nml RV  P:  Cont to monitor  RENAL A:   AKI >> resolved Hyponatremia P:   Continue lasix for now Monitor BMET and UOP Replace electrolytes as needed   GASTROINTESTINAL A:   Peritonitis s/p laparotomy Protein calorie malnutrition  Morbid obesity P:   Post-op care, nutrition per CCS Protonix for SUP TPN per pharmacy  HEMATOLOGIC A:   Anemia of critical illness, acute blood loss 4/27  P:  Transfuse for Hb < 7 Lovenox for DVT prevention  INFECTIOUS A:   Septic shock 2nd to peritonitis and cellulitis of ostomy site> resolved Coag neg Staph bacteremia, UTI- vancomycin  x 7ds, Rpt blood cx shows clearance  Hx of PCN allergy P:   Monitor off of antibiotics  ENDOCRINE A:   Hx of hypothyroidism P:   Synthroid IV -change to PO soon  NEUROLOGIC A:   Acute metabolic encephalopathy.-improving  Pain P:   fent prn + fentanyl patch   Roselie Awkward, MD [place] [facility] Pager: [phone] Cell: [phone] After 3pm or if no response, call [phone]     04/22/2016 2:18 PM

## 2016-04-23 LAB — CBC WITH DIFFERENTIAL/PLATELET
Basophils Absolute: 0 10*3/uL (ref 0.0–0.1)
Basophils Relative: 1 %
EOS ABS: 0.3 10*3/uL (ref 0.0–0.7)
EOS PCT: 4 %
HCT: 27.5 % — ABNORMAL LOW (ref 36.0–46.0)
Hemoglobin: 8.6 g/dL — ABNORMAL LOW (ref 12.0–15.0)
LYMPHS ABS: 2.6 10*3/uL (ref 0.7–4.0)
Lymphocytes Relative: 29 %
MCH: 29.1 pg (ref 26.0–34.0)
MCHC: 31.3 g/dL (ref 30.0–36.0)
MCV: 92.9 fL (ref 78.0–100.0)
MONOS PCT: 9 %
Monocytes Absolute: 0.8 10*3/uL (ref 0.1–1.0)
Neutro Abs: 5.1 10*3/uL (ref 1.7–7.7)
Neutrophils Relative %: 57 %
PLATELETS: 392 10*3/uL (ref 150–400)
RBC: 2.96 MIL/uL — ABNORMAL LOW (ref 3.87–5.11)
RDW: 17 % — ABNORMAL HIGH (ref 11.5–15.5)
WBC: 8.8 10*3/uL (ref 4.0–10.5)

## 2016-04-23 LAB — COMPREHENSIVE METABOLIC PANEL
ALK PHOS: 86 U/L (ref 38–126)
ALT: 37 U/L (ref 14–54)
ANION GAP: 7 (ref 5–15)
AST: 47 U/L — ABNORMAL HIGH (ref 15–41)
Albumin: 1.9 g/dL — ABNORMAL LOW (ref 3.5–5.0)
BUN: 24 mg/dL — ABNORMAL HIGH (ref 6–20)
CALCIUM: 8.1 mg/dL — AB (ref 8.9–10.3)
CO2: 30 mmol/L (ref 22–32)
Chloride: 94 mmol/L — ABNORMAL LOW (ref 101–111)
Creatinine, Ser: 0.84 mg/dL (ref 0.44–1.00)
GFR calc non Af Amer: >60 mL/min (ref >60)
Glucose, Bld: 104 mg/dL — ABNORMAL HIGH (ref 65–99)
POTASSIUM: 4.4 mmol/L (ref 3.5–5.1)
SODIUM: 131 mmol/L — AB (ref 135–145)
TOTAL PROTEIN: 6.3 g/dL — AB (ref 6.5–8.1)
Total Bilirubin: 0.4 mg/dL (ref 0.3–1.2)

## 2016-04-23 LAB — RENAL FUNCTION PANEL
Albumin: 1.9 g/dL — ABNORMAL LOW (ref 3.5–5.0)
Anion gap: 8 (ref 5–15)
BUN: 24 mg/dL — AB (ref 6–20)
CHLORIDE: 94 mmol/L — AB (ref 101–111)
CO2: 29 mmol/L (ref 22–32)
CREATININE: 0.81 mg/dL (ref 0.44–1.00)
Calcium: 8.2 mg/dL — ABNORMAL LOW (ref 8.9–10.3)
GFR calc Af Amer: >60 mL/min (ref >60)
Glucose, Bld: 108 mg/dL — ABNORMAL HIGH (ref 65–99)
Phosphorus: 4 mg/dL (ref 2.5–4.6)
Potassium: 4.3 mmol/L (ref 3.5–5.1)
Sodium: 131 mmol/L — ABNORMAL LOW (ref 135–145)

## 2016-04-23 LAB — PHOSPHORUS: PHOSPHORUS: 3.9 mg/dL (ref 2.5–4.6)

## 2016-04-23 LAB — GLUCOSE, CAPILLARY
GLUCOSE-CAPILLARY: 74 mg/dL (ref 65–99)
Glucose-Capillary: 77 mg/dL (ref 65–99)

## 2016-04-23 LAB — PREALBUMIN: PREALBUMIN: 19.4 mg/dL (ref 18–38)

## 2016-04-23 LAB — MAGNESIUM: Magnesium: 2.2 mg/dL (ref 1.7–2.4)

## 2016-04-23 MED ORDER — LEVOTHYROXINE SODIUM 100 MCG PO TABS
200.0000 ug | ORAL_TABLET | Freq: Every day | ORAL | Status: DC
  Administered 2016-04-24 – 2016-05-02 (×9): 200 ug via ORAL
  Filled 2016-04-23 (×9): qty 2

## 2016-04-23 MED ORDER — BUDESONIDE 0.5 MG/2ML IN SUSP
0.5000 mg | Freq: Two times a day (BID) | RESPIRATORY_TRACT | Status: DC
  Administered 2016-04-23 – 2016-05-02 (×18): 0.5 mg via RESPIRATORY_TRACT
  Filled 2016-04-23 (×18): qty 2

## 2016-04-23 MED ORDER — PANTOPRAZOLE SODIUM 40 MG PO PACK
40.0000 mg | PACK | Freq: Every day | ORAL | Status: DC
  Administered 2016-04-23 – 2016-04-27 (×5): 40 mg
  Filled 2016-04-23 (×4): qty 20

## 2016-04-23 MED ORDER — VITAL HIGH PROTEIN PO LIQD
1000.0000 mL | ORAL | Status: DC
Start: 2016-04-23 — End: 2016-04-27
  Administered 2016-04-23 – 2016-04-26 (×4): 1000 mL
  Filled 2016-04-23 (×6): qty 1000

## 2016-04-23 MED ORDER — OXYCODONE HCL 5 MG/5ML PO SOLN
5.0000 mg | ORAL | Status: DC | PRN
  Administered 2016-04-23 – 2016-04-26 (×8): 10 mg via ORAL
  Filled 2016-04-23 (×9): qty 10

## 2016-04-23 NOTE — Care Management Note (Signed)
Case Management Note  Patient Details  Name: Christina Castaneda MRN: [id number] Date of Birth: March 09, 1956  Subjective/Objective:     S/p Procedure:completion left colectomy with mobilization splenic flexure, end colostomy, placement of abdominal vac sponge               Action/Plan:  PTA - pt is independent from home alone with husband, recent surgery 3/20 [facility] with colostomy at [facility].  Pt may benefit from HH/DME at discharge; CM will continue to monitor for disposition needs   Expected Discharge Date:                  Expected Discharge Plan:  [place]  In-House Referral:     Discharge planning Services  CM Consult  Post Acute Care Choice:    Choice offered to:     DME Arranged:    DME Agency:     HH Arranged:    [place] Agency:     Status of Service:  In process, will continue to follow  Medicare Important Message Given:    Date Medicare IM Given:    Medicare IM give by:    Date Additional Medicare IM Given:    Additional Medicare Important Message give by:     If discussed at [place] of Stay Meetings, dates discussed:    Additional Comments: 04/23/2016  CIR recommendation placed, CSW consulted for back up plan  04/19/16 CM spoke with husband in great detail regarding discharge planning.  Husband is very positive regarding pts recovery.  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  Pt remains vented with an open wound.  CM will continue to follow for disposition need Maryclare Labrador, RN 04/23/2016, 10:58 AM

## 2016-04-23 NOTE — Progress Notes (Addendum)
Physical Therapy Treatment Patient Details Name: Christina Castaneda MRN: [id number] DOB: [birth year] Today's Date: 04/23/2016    History of Present Illness Pt adm with Necrotic stoma with peritonitis and fascial necrosis after recent colostomy in [place]. Underwent S/p laparotomy, colostomy removal and end stapling of stoma, partial colectomy, omentectomy, debridement of wound--Dr. Hulen Skains, subsequent RUQ stoma and washout, subsequent ABRA placement. Underwent Removal of ABRA device, closure of abdominal wall with retention sutures on 04/18/16. PMH -HTN, gout, obesity, COPD    PT Comments    Pt admitted with above diagnosis. Pt currently with functional limitations due to balance and endurance deficits. Pt was able to sit EOB 10 min with mod assist initially progressing to min guard assist.  Pt also performed LE exercises sitting EOB.  Progressing daily. Hopeful for Rehab soon.   Pt will benefit from skilled PT to increase their independence and safety with mobility to allow discharge to the venue listed below.    Follow Up Recommendations  CIR     Equipment Recommendations  Other (comment) (To be determined)    Recommendations for Other Services Rehab consult     Precautions / Restrictions Precautions Precautions: Fall Precaution Comments: Abdominal binder Restrictions Weight Bearing Restrictions: No    Mobility  Bed Mobility Overal bed mobility: Needs Assistance Bed Mobility: Rolling;Sidelying to Sit;Sit to Sidelying Rolling: Mod assist;+2 for safety/equipment Sidelying to sit: Mod assist;+2 for physical assistance     Sit to sidelying: Mod assist;+2 for physical assistance General bed mobility comments: Assist for legs and to elevate trunk. Assist to lower trunk and to bring legs back up into bed.  Transfers                 General transfer comment: deferred this visit  Ambulation/Gait                 Stairs            Wheelchair Mobility    Modified  [place] (Stroke Patients Only)       Balance Overall balance assessment: Needs assistance Sitting-balance support: Bilateral upper extremity supported;Feet supported Sitting balance-Leahy Scale: Poor Sitting balance - Comments: Sat EOB x 10 minutes with initially needing mod assist but progressed to min guard  A. Pt washed her face a little bit but couldn't do it much.  OT washed pts back while sitting and pt performed LAQ sitting EOB as well.   Postural control: Posterior lean                          Cognition Arousal/Alertness: Awake/alert Behavior During Therapy: WFL for tasks assessed/performed;Anxious Overall Cognitive Status: Within Functional Limits for tasks assessed                      Exercises General Exercises - Lower Extremity Ankle Circles/Pumps: AROM;Both;10 reps;Supine;Seated Long Arc Quad: AROM;Both;10 reps;Seated    General Comments General comments (skin integrity, edema, etc.): Bil LEs with edema.  Placed binder on abdomen prior to mobilizing and unhooked prior to departure from room.  Also pt had a little BM and cleaned pt as well.        Pertinent Vitals/Pain Pain Assessment: Faces Faces Pain Scale: Hurts whole lot Pain Location: bil LEs with movement, abdomen Pain Descriptors / Indicators: Aching;Sore Pain Intervention(s): Limited activity within patient's tolerance;Monitored during session;Premedicated before session;Repositioned;Patient requesting pain meds-RN notified  VSS on 28% trach collar    Home Living  Prior Function            PT Goals (current goals can now be found in the care plan section) Acute Rehab PT Goals Patient Stated Goal: Return home Progress towards PT goals: Progressing toward goals    Frequency  Min 3X/week    PT Plan Current plan remains appropriate    Co-evaluation PT/OT/SLP Co-Evaluation/Treatment: Yes Reason for Co-Treatment: Complexity of the patient's  impairments (multi-system involvement) PT goals addressed during session: Mobility/safety with mobility       End of Session Equipment Utilized During Treatment: Gait belt;Oxygen (trach collar) Activity Tolerance: Patient tolerated treatment well Patient left: in bed;with call bell/phone within reach;with SCD's reapplied     Time: [id number] PT Time Calculation (min) (ACUTE ONLY): 31 min  Charges:  $Therapeutic Activity: 8-22 mins                    G CodesDenice Paradise 2016-05-14, 9:53 AM  [facility] [phone] (pager)

## 2016-04-23 NOTE — Progress Notes (Signed)
Speech Language Pathology Treatment: Christina Castaneda Speaking valve  Patient Details Name: Christina Castaneda MRN: [id number] DOB: 01-07-1956 Today's Date: 04/23/2016 Time: [id number] SLP Time Calculation (min) (ACUTE ONLY): 8 min  Assessment / Plan / Recommendation Clinical Impression  Pt with cuff deflated and audible secretions upon SLP arrival. Min cues provided for cough to clear, although unsuccessful. SLP placed PMSV which facilitated stronger cued cough, expelling the valve from the trach hub with small amount of secretions behind it. Pt achieved louder voicing with PMSV today, although continues to have reduced breath support which limits phonation time. Pt only wore the valve for 1-2 minutes at a time today before requesting that it be taken off because it was "choking" her. VS remained stable and no air trapping noted upon removal. Per chart review, note that plan is for possible downsize today, which may increase pt's comfort with valve. Encouraged pt to wear valve in small increments at least for PO intake (no trials provided today as pt unable to wear valve).   HPI HPI: 60 yo female had diverticulitis with sigmoid colon resection, appendectomy at [place], [place] on 3/20 and required colostomy.Came home to [place] , [place] Noted to have feculent material from midline wound and present to ER on 4/5. S/p laparotomy, colostomy removal and end stapling of stoma, partial colectomy, omentectomy, debridement of wound--Dr. Hulen Skains, subsequent ruq stoma and washout, subsequent abra placement by Dr Redmond Pulling. Pt was intubated 4/6-4/13, then trach.       SLP Plan  Continue with current plan of care     Recommendations  Diet recommendations: Dysphagia 2 (fine chop);Thin liquid Liquids provided via: Cup;Straw Medication Administration: Crushed with puree Supervision: Full supervision/cueing for compensatory strategies Compensations: Slow rate;Small sips/bites Postural Changes and/or Swallow Maneuvers: Seated upright 90  degrees;Upright 30-60 min after meal      Patient may use Passy-Muir Speech Valve: Intermittently with supervision;During all therapies with supervision PMSV Supervision: Full MD: Please consider changing trach tube to : Smaller size;Cuffless      Oral Care Recommendations: Oral care BID Follow up Recommendations: Inpatient Rehab Plan: Continue with current plan of care     GO               Germain Osgood, M.A. CCC-SLP [phone]  Germain Osgood 04/23/2016, 2:02 PM

## 2016-04-23 NOTE — Significant Event (Signed)
Spoke with Dr. [place] Bells regarding patient's current pain regimen, that patient requesting frequent pain medication, < Q1H. Per MD's verbal order, RN can give oxycodone suspension 5-10mg  Q4H. RN to enter order as MD unable to at the time verbal order was given.

## 2016-04-23 NOTE — Progress Notes (Signed)
Nutrition Consult/Follow Up  DOCUMENTATION CODES:   Morbid obesity  INTERVENTION:   Initiate Vital High Protein formula at 25 ml/hr and increase by 10 ml every 8 hours to goal rate of 75 ml/hr  TF regimen to provide 1800 kcals, 157 gm protein, 1505 ml of free water daily  NUTRITION DIAGNOSIS:   Inadequate oral intake now related to poor appetite, altered GI function as evidenced by meal completion < 25%, ongoing  GOAL:   Patient will meet greater than or equal to 90% of their needs, currently unmet  MONITOR:   TF tolerance, PO intake, Labs, Weight trends, Skin, I & O's  ASSESSMENT:   60 y.o. Female presented with postoperative complication. 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 She went to a nearby hospital and was diagnosed with perforated diverticulitis. She underwent an exploratory laparotomy with sigmoid colon resection on March 20. She was discharged from the hospital there after a week and just returned home to [place] 5 days ago. Yesterday she reports developing leakage of fecal matter from her midline surgical incision site. She continues to have normal ostomy output from her left lower quadrant stoma. Over the last 24 hours she has also experienced increased generalized abdominal pain.  Patient s/p procedure 4/6: COLOSTOMY REMOVAL AND END STAPLING OF STOMA PARTIAL COLECTOMY EXPLORATORY LAPAROTOMY OMENTECTOMY DEBRIDEMENT WOUND  Trach placed 4/13. Removal of ABRA device, closure of abdominal wall 4/27.  Pt currently on trach collar. S/p FEES 4/28.  Diet advanced to Dys 2 - thin liquids. Spoke with RN.  Pt's PO intake very poor.  CORTRAK tube remains in place (tip in stomach). Plan is to wean off TPN and start tube feeding for nutrition support.  Patient is currently receiving TPN with Clinimix E 5/15 @ 83 ml/hr. No ILE at this time. Provides 1414 kcals and 100 gm protein per day.  Meets 78% minimum re-estimated energy needs and 69% minimum estimated protein needs.  Diet Order:  DIET DYS 2 Room service appropriate?: Yes; Fluid consistency:: Thin .TPN (CLINIMIX-E) Adult  Skin:  Wound (see comment) (abdominal wound VAC)  Last BM:  5/1  Height:   Ht Readings from Last 1 Encounters:  04/13/16 5\' 6"  (1.676 m)    Weight:   Wt Readings from Last 1 Encounters:  04/20/16 319 lb (144.697 kg)    Ideal Body Weight:  59 kg  BMI:  Body mass index is 51.51 kg/(m^2).  Estimated Nutritional Needs:   Kcal:  1800-2000  Protein:  145-155 gm  Fluid:  per MD  EDUCATION NEEDS:   No education needs identified at this time  Arthur Holms, RD, LDN Pager #: [phone] After-Hours Pager #: [phone]

## 2016-04-23 NOTE — Progress Notes (Signed)
Occupational Therapy Treatment Patient Details Name: Christina Castaneda MRN: [id number] DOB: October 04, 1956 Today's Date: 04/23/2016    History of present illness Pt adm with Necrotic stoma with peritonitis and fascial necrosis after recent colostomy in [place]. Underwent S/p laparotomy, colostomy removal and end stapling of stoma, partial colectomy, omentectomy, debridement of wound--Dr. Hulen Skains, subsequent RUQ stoma and washout, subsequent ABRA placement. Underwent Removal of ABRA device, closure of abdominal wall with retention sutures on 04/18/16. PMH -HTN, gout, obesity, COPD   OT comments  Pt able to sit EOB x 10 minutes today with less assistance needed for bed mobility and sitting balance. Works hard, but limited by abdominal pain. VSS throughout session. Continues to be a good inpatient rehab candidate.   Follow Up Recommendations  CIR    Equipment Recommendations  Tub/shower bench    Recommendations for Other Services      Precautions / Restrictions Precautions Precautions: Fall Precaution Comments: Abdominal binder Restrictions Weight Bearing Restrictions: No       Mobility Bed Mobility Overal bed mobility: Needs Assistance Bed Mobility: Rolling;Sidelying to Sit;Sit to Sidelying Rolling: Mod assist;+2 for safety/equipment Sidelying to sit: Mod assist;+2 for physical assistance     Sit to sidelying: Mod assist;+2 for physical assistance General bed mobility comments: Assist for legs and to elevate trunk. Assist to lower trunk and to bring legs back up into bed.  Transfers                 General transfer comment: deferred this visit    Balance Overall balance assessment: Needs assistance Sitting-balance support: Bilateral upper extremity supported;Feet supported Sitting balance-Leahy Scale: Poor Sitting balance - Comments: Sat EOB x 10 minutes with initially needing mod assist but progressed to min guard  A. Pt washed her face a little bit but couldn't do it much.   OT washed pts back while sitting and pt performed LAQ sitting EOB as well.   Postural control: Posterior lean                         ADL Overall ADL's : Needs assistance/impaired     Grooming: Wash/dry face;Sitting;Supervision/safety   Upper Body Bathing: Moderate assistance;Sitting                                    Vision                     Perception     Praxis      Cognition   Behavior During Therapy: WFL for tasks assessed/performed;Anxious Overall Cognitive Status: Within Functional Limits for tasks assessed                       Extremity/Trunk Assessment               Exercises General Exercises - Lower Extremity Ankle Circles/Pumps: AROM;Both;10 reps;Supine;Seated Long Arc Quad: AROM;Both;10 reps;Seated   Shoulder Instructions       General Comments      Pertinent Vitals/ Pain       Pain Assessment: Faces Faces Pain Scale: Hurts whole lot Pain Location: abdomen, B LEs with movement Pain Descriptors / Indicators: Aching;Sore Pain Intervention(s): Monitored during session;Premedicated before session;Repositioned;Patient requesting pain meds-RN notified  Home Living  Prior Functioning/Environment              Frequency Min 2X/week     Progress Toward Goals  OT Goals(current goals can now be found in the care plan section)  Progress towards OT goals: Progressing toward goals  Acute Rehab OT Goals Patient Stated Goal: Return home Time For Goal Achievement: 05/04/16 Potential to Achieve Goals: Good  Plan Discharge plan remains appropriate    Co-evaluation    PT/OT/SLP Co-Evaluation/Treatment: Yes Reason for Co-Treatment: Complexity of the patient's impairments (multi-system involvement);For patient/therapist safety PT goals addressed during session: Mobility/safety with mobility OT goals addressed during session: ADL's and self-care       End of Session Equipment Utilized During Treatment: Oxygen   Activity Tolerance     Patient Left in bed;with call bell/phone within reach   Nurse Communication Patient requests pain meds        Time: [id number] OT Time Calculation (min): 31 min  Charges: OT General Charges $OT Visit: 1 Procedure OT Treatments $Therapeutic Activity: 8-22 mins  Malka So 04/23/2016, 9:58 AM  [phone]

## 2016-04-23 NOTE — Consult Note (Addendum)
WOC ostomy consult note Stoma type/location: RUQ colostomy Stomal assessment/size: 1 3/4" x 1 3/8" oval, pink, moist Peristomal assessment: intact  Treatment options for stomal/peristomal skin: 2" skin barrier due to proximity to the surgical wound Output liquid brown  Ostomy pouching: 1pc flat with barrier ring  Education provided: will start to provide some education with patient and family.  Patient is awake but has a trach.  Not sure if she is using [employer] valve yet. Family arrived at the bedside while [place] nurse here.   Demonstrated measurement of stoma, cutting 1pc pouch, use of barrier ring to patient and husband. Patient and husband were attempting to manage her other colostomy prior to this, they report it was retracted and leaked all the time.  Enrolled patient in [place] program:No  Patient is noted to have a wound  on the right side of her open incision, it is not really under the stay sutures so I feel it may be more likely related to abdominal edema and just the extent of the surgical procedures performed on her.   WOC will follow along with you for continued support with ostomy teaching and care [facility] RN,CWOCN [id number]

## 2016-04-23 NOTE — Progress Notes (Addendum)
RT attempted to change trach per MD order.  Resistance was met.  RT called [facility] and informed Dr. Sommers Trach was not changed.   

## 2016-04-23 NOTE — Progress Notes (Signed)
PULMONARY / CRITICAL CARE MEDICINE   Name: Christina Castaneda MRN: [id number] DOB: 1956/02/04    ADMISSION DATE:  03/28/2016 CONSULTATION DATE:  03/29/2016  REFERRING MD:  CCS  CHIEF COMPLAINT:  Colostomy bag leaking  BRIEF: 60 y/o female had diverticulitis with sigmoid colon resection, appendectomy at [place] [place] on 3/20 and required colostomy.  Came home to [place], [place] and noted to have feculent material from midline wound and present to ER on 4/5. S/p laparotomy, colostomy removal and end stapling of stoma, partial colectomy, omentectomy, debridement of wound--Dr. Hulen Skains, subsequent ruq stoma and washout, subsequent abra placement by Dr Redmond Pulling.  MICROBIOLOGY: Blood Ctx x1 4/9 >> Coag neg staph Urine Ctx 4/9 >> Coag neg staph Tracheal Asp Ctx 4/9 >> yeast Blood Ctx x2 4/18 >> negative Urine Ctx 4/5 >>  Multiple species present MRSA PCR 4/6 >>  Negative   ANTIBIOTICS: Cipro 4/5 >> 4/14 Flagyl 4/5 >> 4/14 Vanco 4/11 >> 4/14  LINES/TUBES: ETT 4/6>>> 4/13 R PICC 4/6 >> L RADIAL ART LINE 4/7>>>4/15 #6 cuffed Trach [employer]) 4/13 >> Rt femoral aline 4/15 >> out   SIGNIFICANT EVENTS: 3/20  Exp lap appendectomy,removal sigmoid colon, bowel repair in setting diverticulitis. [facility] 4/06  Exp Lap , colostomy removal, partial colectomy,omentectom, wound debridement, open wound with wound vac. 4/10  Start TPN 4/21  Lightened sedation  with open abdomen. Remains critically ill , intubated on  Low dose levo gtt.  lower doses versed/ fent gtt  4/26  fascia closure & removal of abra device 4/27  ATC daytime > continued 4/29 4/30  On ATC since 4/28 am, tolerating PMV some   SUBJECTIVE:  Pt reports feeling tired and abdominal pain after working with PT.  Sat on the side of the bed for approx 10 mins.  Feels the PMV is "not natural" and is able to phonate around the trach.  Small bore feeding tube placed. Remains on TNA  VITAL SIGNS: BP 121/56 mmHg  Pulse 52  Temp(Src) 98 F (36.7 C) (Oral)  Resp 16  Ht  5\' 6"  (1.676 m)  Wt 319 lb (144.697 kg)  BMI 51.51 kg/m2  SpO2 100%  LMP 11/18/2012  HEMODYNAMICS: CVP:  [14 mmHg-62 mmHg] 16 mmHg  VENTILATOR SETTINGS: Vent Mode:  [-]  FiO2 (%):  [28 %] 28 %  INTAKE / OUTPUT: I/O last 3 completed shifts: In: 3255 [I.V.:350] Out: 5615 [Urine:5515; Stool:100]  PHYSICAL EXAMINATION: General: Alert and interactive, obese Neuro: AAOx4, speech clear, MAE / generalized weakness  HEENT: trach site clean, mm pink/moist Cardiac: RRR, no mgr Chest: even/non-labored, lungs bilaterally with wheezing  Abd: infrequent bowel sounds, abdominal dressing in place, colostomy  Ext: trace edema Skin: no rash   LABS:  PULMONARY  Recent Labs Lab 04/17/16 0600  PHART 7.547*  PCO2ART 32.2*  PO2ART 129*  HCO3 27.9*  TCO2 28.9  O2SAT 99.2   CBC  Recent Labs Lab 04/21/16 0559 04/22/16 0410 04/23/16 0509  HGB 8.2* 8.6* 8.6*  HCT 27.7* 28.0* 27.5*  WBC 8.3 8.1 8.8  PLT 415* 410* 392   COAGULATION No results for input(s): INR in the last 168 hours.  CARDIAC  No results for input(s): TROPONINI in the last 168 hours. No results for input(s): PROBNP in the last 168 hours.  CHEMISTRY  Recent Labs Lab 04/17/16 0532  04/18/16 0305 04/19/16 0310 04/20/16 0315 04/21/16 0558 04/22/16 0410 04/23/16 0509  NA 123*  < > 133*  133* 133*  132* 130* 133* 130* 131*  131*  K 6.0*  < > 3.8  3.8 4.2  4.1 3.9 4.2 4.0 4.4  4.3  CL 88*  < > 97*  97* 97*  97* 96* 97* 95* 94*  94*  CO2 27  < > 28  28 28  27 27 29 29 30  29   GLUCOSE 1209*  < > 107*  110* 116*  111* 104* 98 101* 104*  108*  BUN 21*  < > 23*  23* 24*  24* 22* 19 21* 24*  24*  CREATININE 0.95  < > 0.71  0.76 0.83  0.82 0.78 0.76 0.76 0.84  0.81  CALCIUM 7.9*  < > 8.0*  8.0* 7.7*  7.6* 7.8* 7.9* 7.9* 8.1*  8.2*  MG 2.4  --  2.2 2.1  --   --   --  2.2  PHOS 8.6*  < > 4.1  4.0 4.1  4.1 3.3 3.6 3.6 3.9  4.0  < > = values in this interval not displayed. Estimated  Creatinine Clearance: 102.5 mL/min (by C-G formula based on Cr of 0.84).  LIVER  Recent Labs Lab 04/19/16 0310 04/20/16 0315 04/21/16 0558 04/22/16 0410 04/23/16 0509  AST 54*  --   --   --  47*  ALT 35  --   --   --  37  ALKPHOS 91  --   --   --  86  BILITOT 0.3  --   --   --  0.4  PROT 5.8*  --   --   --  6.3*  ALBUMIN 1.7*  1.7* 1.8* 1.7* 1.8* 1.9*  1.9*   INFECTIOUS No results for input(s): LATICACIDVEN, PROCALCITON in the last 168 hours.  ENDOCRINE CBG (last 3)   Recent Labs  04/22/16 1134 04/22/16 2211 04/23/16 1010  GLUCAP 95 104* 74   IMAGING x48h No results found.    ASSESSMENT / PLAN:  PULMONARY A: Acute resp failure, prolonged ventialtion s/p tracheostomy - now doing well with trach collar trials Hx of COPD with asthma, OSA -  wheezing noted 4/14 Tobacco abuse P:   Continue trach collar Downsize tracheostomy 5/1 - will discuss uncuffed 6 vs 4 with MD Titrate O2 for sat of 88-92%. Brovana, pulmicort with prn albuterol  CARDIOVASCULAR A:  Septic shock - resolved Hx of HTN Echo - nml LV fn, nml RV P:  Cont to monitor, ok to transfer to SDU when ok with CCS  RENAL A:   AKI - resolved Hyponatremia P:   Continue lasix 20 mg IV Q12   Monitor BMET and UOP Replace electrolytes as needed  GASTROINTESTINAL A:   Peritonitis s/p laparotomy Protein calorie malnutrition  Morbid obesity P:   Post-op care, nutrition per CCS Protonix for SUP TPN per pharmacy Small bore feeding tube placed for TF  HEMATOLOGIC A:   Anemia of critical illness, acute blood loss 4/27  P:  Transfuse for Hb < 7 Lovenox for DVT prevention Limit phlebotomy as able   INFECTIOUS A:   Septic shock 2nd to peritonitis and cellulitis of ostomy site - resolved Coag neg Staph bacteremia, UTI - treated with vancomycin  x 7ds, Rpt blood cx shows clearance  Hx of PCN allergy P:   Monitor off of antibiotics Trend fever curve / wbc  ENDOCRINE A:   Hx of  hypothyroidism P:   Change synthroid to PO  NEUROLOGIC A:   Acute metabolic encephalopathy - resolved  Pain P:   fent prn + fentanyl patch  Noe Gens, NP-C [facility] Pgr: [phone] or if no answer [phone] 04/23/2016, 10:27 AM

## 2016-04-23 NOTE — Progress Notes (Signed)
5 Days Post-Op  Subjective: Patient is awake, has been on trach collar for 2 days Poor appetite, limited PO intake Feeding tube placed - no tube feeds yet On TNA   Objective: Vital signs in last 24 hours: Temp:  [98 F (36.7 C)-98.8 F (37.1 C)] 98 F (36.7 C) (05/01 0745) Pulse Rate:  [44-71] 54 (05/01 1000) Resp:  [14-21] 15 (05/01 1000) BP: (110-147)/(40-70) 116/40 mmHg (05/01 1000) SpO2:  [96 %-100 %] 98 % (05/01 1000) FiO2 (%):  [28 %] 28 % (05/01 1000) Last BM Date: 04/23/16  Intake/Output from previous day: 04/30 0701 - 05/01 0700 In: 2139 [I.V.:230; TPN:1909] Out: 3745 [Urine:3670; Stool:75] Intake/Output this shift: Total I/O In: 279 [I.V.:30; TPN:249] Out: 620 [Urine:620]  General appearance: alert, cooperative and no distress Cardio: regular rate and rhythm, S1, S2 normal, no murmur, click, rub or gallop GI: soft, obese, ostomy with thick brown stool Midline - wound intact; retention bridges with some skin maceration  Lab Results:   Recent Labs  04/22/16 0410 04/23/16 0509  WBC 8.1 8.8  HGB 8.6* 8.6*  HCT 28.0* 27.5*  PLT 410* 392   BMET  Recent Labs  04/22/16 0410 04/23/16 0509  NA 130* 131*  131*  K 4.0 4.4  4.3  CL 95* 94*  94*  CO2 29 30  29   GLUCOSE 101* 104*  108*  BUN 21* 24*  24*  CREATININE 0.76 0.84  0.81  CALCIUM 7.9* 8.1*  8.2*   PT/INR No results for input(s): LABPROT, INR in the last 72 hours. ABG No results for input(s): PHART, HCO3 in the last 72 hours.  Invalid input(s): PCO2, PO2  Studies/Results: No results found.  Anti-infectives: Anti-infectives    Start     Dose/Rate Route Frequency Ordered Stop   04/18/16 1000  ciprofloxacin (CIPRO) IVPB 400 mg  Status:  Discontinued     400 mg 200 mL/hr over 60 Minutes Intravenous To Short Stay 04/17/16 1026 04/18/16 1340   04/03/16 2100  vancomycin (VANCOCIN) 1,500 mg in sodium chloride 0.9 % 500 mL IVPB  Status:  Discontinued     1,500 mg 250 mL/hr over 120  Minutes Intravenous Every 12 hours 04/02/16 2020 04/06/16 0943   04/02/16 2030  vancomycin (VANCOCIN) 2,500 mg in sodium chloride 0.9 % 500 mL IVPB     2,500 mg 250 mL/hr over 120 Minutes Intravenous  Once 04/02/16 2020 04/02/16 2306   03/29/16 1200  ciprofloxacin (CIPRO) IVPB 400 mg  Status:  Discontinued     400 mg 200 mL/hr over 60 Minutes Intravenous Every 12 hours 03/29/16 0213 04/06/16 0943   03/29/16 0600  [MAR Hold]  ciprofloxacin (CIPRO) IVPB 400 mg     (MAR Hold since 03/28/16 2357)   400 mg 200 mL/hr over 60 Minutes Intravenous On call to O.R. 03/28/16 2306 03/28/16 2355   03/29/16 0600  [MAR Hold]  metroNIDAZOLE (FLAGYL) IVPB 500 mg     (MAR Hold since 03/28/16 2357)   500 mg 100 mL/hr over 60 Minutes Intravenous On call to O.R. 03/28/16 2306 03/29/16 0025   03/29/16 0600  metroNIDAZOLE (FLAGYL) IVPB 500 mg  Status:  Discontinued     500 mg 100 mL/hr over 60 Minutes Intravenous Every 8 hours 03/29/16 0213 04/06/16 0943   03/28/16 2300  ertapenem (INVANZ) 1 g in sodium chloride 0.9 % 50 mL IVPB  Status:  Discontinued     1 g 100 mL/hr over 30 Minutes Intravenous  Once 03/28/16 2259 03/28/16 2306  Assessment/Plan: s/p Procedure(s): EXPLORATORY LAPAROTOMY (N/A) CLOSURE OF ABDOMEN (N/A) Open abdomen s/p ABRA placement Hx of Hartman's procedure in [place] secondary to perforated diverticulitis Abdomen closed Start PT/OT Trach in place Speech to evaluate swallowing  Start tube feeds/ wean off TNA Transfer to step-down unit OOB today   LOS: 26 days    Yamaris Cummings K. 04/23/2016

## 2016-04-24 LAB — CBC WITH DIFFERENTIAL/PLATELET
BASOS ABS: 0 10*3/uL (ref 0.0–0.1)
BASOS PCT: 1 %
EOS ABS: 0.3 10*3/uL (ref 0.0–0.7)
EOS PCT: 4 %
HCT: 29 % — ABNORMAL LOW (ref 36.0–46.0)
Hemoglobin: 8.9 g/dL — ABNORMAL LOW (ref 12.0–15.0)
Lymphocytes Relative: 25 %
Lymphs Abs: 2 10*3/uL (ref 0.7–4.0)
MCH: 28.5 pg (ref 26.0–34.0)
MCHC: 30.7 g/dL (ref 30.0–36.0)
MCV: 92.9 fL (ref 78.0–100.0)
MONO ABS: 0.8 10*3/uL (ref 0.1–1.0)
Monocytes Relative: 10 %
NEUTROS ABS: 4.9 10*3/uL (ref 1.7–7.7)
Neutrophils Relative %: 60 %
PLATELETS: 388 10*3/uL (ref 150–400)
RBC: 3.12 MIL/uL — ABNORMAL LOW (ref 3.87–5.11)
RDW: 17.2 % — AB (ref 11.5–15.5)
WBC: 8.1 10*3/uL (ref 4.0–10.5)

## 2016-04-24 LAB — TYPE AND SCREEN
ABO/RH(D): O POS
ANTIBODY SCREEN: NEGATIVE
Unit division: 0

## 2016-04-24 LAB — GLUCOSE, CAPILLARY
GLUCOSE-CAPILLARY: 74 mg/dL (ref 65–99)
GLUCOSE-CAPILLARY: 87 mg/dL (ref 65–99)
GLUCOSE-CAPILLARY: 87 mg/dL (ref 65–99)
GLUCOSE-CAPILLARY: 92 mg/dL (ref 65–99)
Glucose-Capillary: 79 mg/dL (ref 65–99)
Glucose-Capillary: 82 mg/dL (ref 65–99)

## 2016-04-24 LAB — RENAL FUNCTION PANEL
ALBUMIN: 2 g/dL — AB (ref 3.5–5.0)
ANION GAP: 9 (ref 5–15)
BUN: 23 mg/dL — AB (ref 6–20)
CALCIUM: 8.1 mg/dL — AB (ref 8.9–10.3)
CO2: 27 mmol/L (ref 22–32)
CREATININE: 0.89 mg/dL (ref 0.44–1.00)
Chloride: 97 mmol/L — ABNORMAL LOW (ref 101–111)
GFR calc Af Amer: >60 mL/min (ref >60)
GFR calc non Af Amer: >60 mL/min (ref >60)
GLUCOSE: 82 mg/dL (ref 65–99)
PHOSPHORUS: 3.7 mg/dL (ref 2.5–4.6)
Potassium: 3.9 mmol/L (ref 3.5–5.1)
SODIUM: 133 mmol/L — AB (ref 135–145)

## 2016-04-24 NOTE — Consult Note (Signed)
WOC by to check on patient, she is resting. Had been having some pain issues.  Checked with bedside nurse, pouch intact and draining well.  Extra supplies in the room. No family in the room today at the time of my visit.   WOC will follow along with you for continued support with ostomy teaching and care [facility] RN,CWOCN [phone]

## 2016-04-24 NOTE — Progress Notes (Signed)
PULMONARY / CRITICAL CARE MEDICINE   Name: Christina Castaneda MRN: [id number] DOB: [birth year]    ADMISSION DATE:  03/28/2016 CONSULTATION DATE:  03/29/2016  REFERRING MD:  CCS  CHIEF COMPLAINT:  Colostomy bag leaking  BRIEF: 60 y/o female had diverticulitis with sigmoid colon resection, appendectomy at [place], [place] on 3/20 and required colostomy.  Came home to [place], [place] and noted to have feculent material from midline wound and present to ER on 4/5. S/p laparotomy, colostomy removal and end stapling of stoma, partial colectomy, omentectomy, debridement of wound--Dr. Hulen Skains, subsequent ruq stoma and washout, subsequent abra placement by Dr Redmond Pulling.   SUBJECTIVE:   RT reports trach change attempted last pm and resistance was met.  Unable to change.  No acute events.    VITAL SIGNS: BP 123/71 mmHg  Pulse 51  Temp(Src) 98.3 F (36.8 C) (Oral)  Resp 16  Ht '5\' 6"'  (1.676 m)  Wt 315 lb 14.7 oz (143.3 kg)  BMI 51.02 kg/m2  SpO2 100%  LMP 11/18/2012  HEMODYNAMICS: CVP:  [14 mmHg-62 mmHg] 58 mmHg  VENTILATOR SETTINGS: Vent Mode:  [-]  FiO2 (%):  [28 %] 28 %  INTAKE / OUTPUT: I/O last 3 completed shifts: In: 2568.2 [I.V.:350; NG/GT:724.2] Out: 4595 [Urine:4470; Stool:125]  PHYSICAL EXAMINATION: General: Alert and interactive, obese Neuro: AAOx4, speech clear, MAE / generalized weakness  HEENT: trach site clean, mm pink/moist Cardiac: RRR, no mgr Chest: even/non-labored, lungs bilaterally with wheezing  Abd: infrequent bowel sounds, abdominal dressing in place, colostomy  Ext: trace edema Skin: no rash   LABS:  PULMONARY No results for input(s): PHART, PCO2ART, PO2ART, HCO3, TCO2, O2SAT in the last 168 hours.  Invalid input(s): PCO2, PO2 CBC  Recent Labs Lab 04/22/16 0410 04/23/16 0509 04/24/16 0311  HGB 8.6* 8.6* 8.9*  HCT 28.0* 27.5* 29.0*  WBC 8.1 8.8 8.1  PLT 410* 392 388   COAGULATION No results for input(s): INR in the last 168 hours.  CARDIAC  No results for  input(s): TROPONINI in the last 168 hours. No results for input(s): PROBNP in the last 168 hours.  CHEMISTRY  Recent Labs Lab 04/18/16 0305 04/19/16 0310 04/20/16 0315 04/21/16 0558 04/22/16 0410 04/23/16 0509 04/24/16 0311  NA 133*  133* 133*  132* 130* 133* 130* 131*  131* 133*  K 3.8  3.8 4.2  4.1 3.9 4.2 4.0 4.4  4.3 3.9  CL 97*  97* 97*  97* 96* 97* 95* 94*  94* 97*  CO2 '28  28 28  27 27 29 29 30  29 27  ' GLUCOSE 107*  110* 116*  111* 104* 98 101* 104*  108* 82  BUN 23*  23* 24*  24* 22* 19 21* 24*  24* 23*  CREATININE 0.71  0.76 0.83  0.82 0.78 0.76 0.76 0.84  0.81 0.89  CALCIUM 8.0*  8.0* 7.7*  7.6* 7.8* 7.9* 7.9* 8.1*  8.2* 8.1*  MG 2.2 2.1  --   --   --  2.2  --   PHOS 4.1  4.0 4.1  4.1 3.3 3.6 3.6 3.9  4.0 3.7   Estimated Creatinine Clearance: 96.1 mL/min (by C-G formula based on Cr of 0.89).  LIVER  Recent Labs Lab 04/19/16 0310 04/20/16 0315 04/21/16 0558 04/22/16 0410 04/23/16 0509 04/24/16 0311  AST 54*  --   --   --  47*  --   ALT 35  --   --   --  37  --   ALKPHOS 91  --   --   --  86  --   BILITOT 0.3  --   --   --  0.4  --   PROT 5.8*  --   --   --  6.3*  --   ALBUMIN 1.7*  1.7* 1.8* 1.7* 1.8* 1.9*  1.9* 2.0*   INFECTIOUS No results for input(s): LATICACIDVEN, PROCALCITON in the last 168 hours.  ENDOCRINE CBG (last 3)   Recent Labs  04/24/16 04/24/16 0436 04/24/16 0743  GLUCAP 74 79 82   IMAGING x48h No results found.     MICROBIOLOGY: Blood Ctx x1 4/9 >> Coag neg staph Urine Ctx 4/9 >> Coag neg staph Tracheal Asp Ctx 4/9 >> yeast Blood Ctx x2 4/18 >> negative Urine Ctx 4/5 >>  Multiple species present MRSA PCR 4/6 >>  Negative   ANTIBIOTICS: Cipro 4/5 >> 4/14 Flagyl 4/5 >> 4/14 Vanco 4/11 >> 4/14  LINES/TUBES: ETT 4/6>>> 4/13 R PICC 4/6 >> L RADIAL ART LINE 4/7>>>4/15 #6 cuffed Trach [employer] Bible) 4/13 >> Rt femoral aline 4/15 >> out   SIGNIFICANT EVENTS: 3/20  Exp lap appendectomy,removal sigmoid  colon, bowel repair in setting diverticulitis. [facility] 4/06  Exp Lap , colostomy removal, partial colectomy,omentectom, wound debridement, open wound with wound vac. 4/10  Start TPN 4/21  Lightened sedation  with open abdomen. Remains critically ill , intubated on  Low dose levo gtt.  lower doses versed/ fent gtt  4/26  fascia closure & removal of abra device 4/27  ATC daytime > continued 4/29 4/30  On ATC since 4/28 am, tolerating PMV some   ASSESSMENT / PLAN:  PULMONARY A: Acute resp failure, prolonged ventialtion s/p tracheostomy - now doing well with trach collar trials Hx of COPD with asthma, OSA -  wheezing noted 4/14 Tobacco abuse P:   Continue trach collar Will attempt to downsize trach later in am Titrate O2 for sat of 88-92%. Brovana, pulmicort with prn albuterol  CARDIOVASCULAR A:  Septic shock - resolved Hx of HTN Echo - nml LV fn, nml RV P:  Cont to monitor, ok to transfer to SDU when ok with CCS  RENAL A:   AKI - resolved Hyponatremia P:   Continue lasix 20 mg IV Q12   Monitor BMET and UOP Replace electrolytes as indicated  GASTROINTESTINAL A:   Peritonitis s/p laparotomy Protein calorie malnutrition  Morbid obesity P:   Post-op care, nutrition per CCS Protonix for SUP TPN per pharmacy Small bore feeding tube placed for TF  HEMATOLOGIC A:   Anemia of critical illness, acute blood loss 4/27  P:  Transfuse for Hb < 7 Lovenox for DVT prevention Limit phlebotomy as able   INFECTIOUS A:   Septic shock 2nd to peritonitis and cellulitis of ostomy site - resolved Coag neg Staph bacteremia, UTI - treated with vancomycin  x 7ds, Rpt blood cx shows clearance  Hx of PCN allergy P:   Monitor off of antibiotics Trend fever curve / wbc  ENDOCRINE A:   Hx of hypothyroidism P:   Change synthroid to PO  NEUROLOGIC A:   Acute metabolic encephalopathy - resolved  Pain Deconditioning P:   fent prn + fentanyl patch  Push PT efforts,  mobilize  Noe Gens, NP-C [facility] Pgr: [phone] or if no answer [phone] 04/24/2016, 8:42 AM    Attending:  I have seen and examined the patient with nurse practitioner/resident and agree with the note above.  We formulated the plan together and I elicited the following history.  Stable, awaiting transfer to SDU Still has a hard time breathing with PMV in place  On exam: Awake, alert No distress, lungs clear, tracheostomy intact Belly soft  Tracheostomy status Peritonitis  Plan Downsize trach to 6 cuffless  Roselie Awkward, MD [facility] Pager: [phone] Cell: [phone] After 3pm or if no response, call [phone]

## 2016-04-24 NOTE — Procedures (Signed)
[facility] P[facility] Tracheostomy Change  First tracheostomy change performed today.  We removed the #6 cuffed shiley and replaced it with a 6 cuffless.  She tolerated the procedure well.  Roselie Awkward, MD [facility] Pager: [phone] Cell: [phone] After 3pm or if no response, call [phone]

## 2016-04-24 NOTE — Progress Notes (Signed)
6 Days Post-Op  Subjective: Awaiting step-down bed CCM to downsize trach Tube feeds initiated - no complaints  Objective: Vital signs in last 24 hours: Temp:  [98 F (36.7 C)-98.6 F (37 C)] 98.3 F (36.8 C) (05/02 0733) Pulse Rate:  [51-67] 64 (05/02 1000) Resp:  [14-22] 16 (05/02 1000) BP: (94-139)/(41-71) 94/41 mmHg (05/02 1000) SpO2:  [97 %-100 %] 99 % (05/02 1000) FiO2 (%):  [28 %] 28 % (05/02 0800) Weight:  [143.3 kg (315 lb 14.7 oz)] 143.3 kg (315 lb 14.7 oz) (05/02 0500) Last BM Date: 04/24/16  Intake/Output from previous day: 05/01 0701 - 05/02 0700 In: 1452.2 [I.V.:230; NG/GT:724.2; TPN:498] Out: 3100 [Urine:3050; Stool:50] Intake/Output this shift: Total I/O In: 400 [I.V.:60; NG/GT:340] Out: 600 [Urine:550; Stool:50]  General appearance: alert, cooperative and no distress GI: soft, obese, non-tender; RLQ ostomy with thick brown output Midline wound - retention bridges; some maceration of surrounding skin  Lab Results:   Recent Labs  04/23/16 0509 04/24/16 0311  WBC 8.8 8.1  HGB 8.6* 8.9*  HCT 27.5* 29.0*  PLT 392 388   BMET  Recent Labs  04/23/16 0509 04/24/16 0311  NA 131*  131* 133*  K 4.4  4.3 3.9  CL 94*  94* 97*  CO2 30  29 27   GLUCOSE 104*  108* 82  BUN 24*  24* 23*  CREATININE 0.84  0.81 0.89  CALCIUM 8.1*  8.2* 8.1*   PT/INR No results for input(s): LABPROT, INR in the last 72 hours. ABG No results for input(s): PHART, HCO3 in the last 72 hours.  Invalid input(s): PCO2, PO2  Studies/Results: No results found.  Anti-infectives: Anti-infectives    Start     Dose/Rate Route Frequency Ordered Stop   04/18/16 1000  ciprofloxacin (CIPRO) IVPB 400 mg  Status:  Discontinued     400 mg 200 mL/hr over 60 Minutes Intravenous To Short Stay 04/17/16 1026 04/18/16 1340   04/03/16 2100  vancomycin (VANCOCIN) 1,500 mg in sodium chloride 0.9 % 500 mL IVPB  Status:  Discontinued     1,500 mg 250 mL/hr over 120 Minutes Intravenous  Every 12 hours 04/02/16 2020 04/06/16 0943   04/02/16 2030  vancomycin (VANCOCIN) 2,500 mg in sodium chloride 0.9 % 500 mL IVPB     2,500 mg 250 mL/hr over 120 Minutes Intravenous  Once 04/02/16 2020 04/02/16 2306   03/29/16 1200  ciprofloxacin (CIPRO) IVPB 400 mg  Status:  Discontinued     400 mg 200 mL/hr over 60 Minutes Intravenous Every 12 hours 03/29/16 0213 04/06/16 0943   03/29/16 0600  [MAR Hold]  ciprofloxacin (CIPRO) IVPB 400 mg     (MAR Hold since 03/28/16 2357)   400 mg 200 mL/hr over 60 Minutes Intravenous On call to O.R. 03/28/16 2306 03/28/16 2355   03/29/16 0600  [MAR Hold]  metroNIDAZOLE (FLAGYL) IVPB 500 mg     (MAR Hold since 03/28/16 2357)   500 mg 100 mL/hr over 60 Minutes Intravenous On call to O.R. 03/28/16 2306 03/29/16 0025   03/29/16 0600  metroNIDAZOLE (FLAGYL) IVPB 500 mg  Status:  Discontinued     500 mg 100 mL/hr over 60 Minutes Intravenous Every 8 hours 03/29/16 0213 04/06/16 0943   03/28/16 2300  ertapenem (INVANZ) 1 g in sodium chloride 0.9 % 50 mL IVPB  Status:  Discontinued     1 g 100 mL/hr over 30 Minutes Intravenous  Once 03/28/16 2259 03/28/16 2306      Assessment/Plan: s/p Procedure(s): EXPLORATORY  LAPAROTOMY (N/A) CLOSURE OF ABDOMEN (N/A) Open abdomen s/p ABRA placement Hx of Hartman's procedure in [place] secondary to perforated diverticulitis Abdomen closed Start PT/OT Trach in place Speech to evaluate swallowing  On tube feeds/ wean off TNA Transfer to step-down unit OOB today   LOS: 27 days    Daje Stark K. 04/24/2016

## 2016-04-25 DIAGNOSIS — D62 Acute posthemorrhagic anemia: Secondary | ICD-10-CM | POA: Insufficient documentation

## 2016-04-25 DIAGNOSIS — Z430 Encounter for attention to tracheostomy: Secondary | ICD-10-CM | POA: Insufficient documentation

## 2016-04-25 DIAGNOSIS — E871 Hypo-osmolality and hyponatremia: Secondary | ICD-10-CM | POA: Insufficient documentation

## 2016-04-25 DIAGNOSIS — M25561 Pain in right knee: Secondary | ICD-10-CM | POA: Insufficient documentation

## 2016-04-25 DIAGNOSIS — J449 Chronic obstructive pulmonary disease, unspecified: Secondary | ICD-10-CM | POA: Insufficient documentation

## 2016-04-25 DIAGNOSIS — E039 Hypothyroidism, unspecified: Secondary | ICD-10-CM | POA: Insufficient documentation

## 2016-04-25 DIAGNOSIS — R1310 Dysphagia, unspecified: Secondary | ICD-10-CM | POA: Insufficient documentation

## 2016-04-25 DIAGNOSIS — I10 Essential (primary) hypertension: Secondary | ICD-10-CM | POA: Insufficient documentation

## 2016-04-25 DIAGNOSIS — Z720 Tobacco use: Secondary | ICD-10-CM | POA: Insufficient documentation

## 2016-04-25 DIAGNOSIS — M25562 Pain in left knee: Secondary | ICD-10-CM

## 2016-04-25 LAB — BASIC METABOLIC PANEL
Anion gap: 9 (ref 5–15)
BUN: 25 mg/dL — ABNORMAL HIGH (ref 6–20)
CHLORIDE: 96 mmol/L — AB (ref 101–111)
CO2: 29 mmol/L (ref 22–32)
CREATININE: 0.9 mg/dL (ref 0.44–1.00)
Calcium: 8.3 mg/dL — ABNORMAL LOW (ref 8.9–10.3)
GFR calc non Af Amer: >60 mL/min (ref >60)
Glucose, Bld: 106 mg/dL — ABNORMAL HIGH (ref 65–99)
POTASSIUM: 4.7 mmol/L (ref 3.5–5.1)
Sodium: 134 mmol/L — ABNORMAL LOW (ref 135–145)

## 2016-04-25 LAB — RENAL FUNCTION PANEL
Albumin: 2 g/dL — ABNORMAL LOW (ref 3.5–5.0)
Anion gap: 9 (ref 5–15)
BUN: 25 mg/dL — AB (ref 6–20)
CHLORIDE: 95 mmol/L — AB (ref 101–111)
CO2: 30 mmol/L (ref 22–32)
Calcium: 7.9 mg/dL — ABNORMAL LOW (ref 8.9–10.3)
Creatinine, Ser: 0.93 mg/dL (ref 0.44–1.00)
GFR calc Af Amer: >60 mL/min (ref >60)
GFR calc non Af Amer: >60 mL/min (ref >60)
GLUCOSE: 109 mg/dL — AB (ref 65–99)
POTASSIUM: 3.7 mmol/L (ref 3.5–5.1)
Phosphorus: 3.2 mg/dL (ref 2.5–4.6)
Sodium: 134 mmol/L — ABNORMAL LOW (ref 135–145)

## 2016-04-25 LAB — GLUCOSE, CAPILLARY
GLUCOSE-CAPILLARY: 102 mg/dL — AB (ref 65–99)
GLUCOSE-CAPILLARY: 98 mg/dL (ref 65–99)
Glucose-Capillary: 103 mg/dL — ABNORMAL HIGH (ref 65–99)
Glucose-Capillary: 104 mg/dL — ABNORMAL HIGH (ref 65–99)
Glucose-Capillary: 89 mg/dL (ref 65–99)
Glucose-Capillary: 93 mg/dL (ref 65–99)

## 2016-04-25 LAB — CBC WITH DIFFERENTIAL/PLATELET
Basophils Absolute: 0 10*3/uL (ref 0.0–0.1)
Basophils Relative: 0 %
EOS PCT: 3 %
Eosinophils Absolute: 0.3 10*3/uL (ref 0.0–0.7)
HCT: 28.3 % — ABNORMAL LOW (ref 36.0–46.0)
Hemoglobin: 8.6 g/dL — ABNORMAL LOW (ref 12.0–15.0)
LYMPHS ABS: 2.1 10*3/uL (ref 0.7–4.0)
LYMPHS PCT: 28 %
MCH: 27.8 pg (ref 26.0–34.0)
MCHC: 30.4 g/dL (ref 30.0–36.0)
MCV: 91.6 fL (ref 78.0–100.0)
MONO ABS: 0.6 10*3/uL (ref 0.1–1.0)
Monocytes Relative: 8 %
Neutro Abs: 4.6 10*3/uL (ref 1.7–7.7)
Neutrophils Relative %: 61 %
PLATELETS: 398 10*3/uL (ref 150–400)
RBC: 3.09 MIL/uL — AB (ref 3.87–5.11)
RDW: 17.3 % — ABNORMAL HIGH (ref 11.5–15.5)
WBC: 7.6 10*3/uL (ref 4.0–10.5)

## 2016-04-25 MED ORDER — POTASSIUM CHLORIDE 20 MEQ/15ML (10%) PO SOLN
20.0000 meq | ORAL | Status: AC
  Administered 2016-04-25 (×2): 20 meq
  Filled 2016-04-25 (×2): qty 15

## 2016-04-25 NOTE — Progress Notes (Signed)
Patient ID: Christina Castaneda, female   DOB: Jun 16, 1956, 60 y.o.   MRN: [id number]     [address] [place], [place] [address]    Phone: [phone] FAX: [phone]     Subjective: Pt awake.  Talking.  Husband at bedside. Taking some POs, but poor intake.    Objective:  Vital signs:  Filed Vitals:   04/25/16 0829 04/25/16 1000 04/25/16 1200 04/25/16 1215  BP:  130/62 126/61 126/61  Pulse:  65 65 58  Temp: 98.2 F (36.8 C) 98.4 F (36.9 C)  99 F (37.2 C)  TempSrc: Oral Oral  Oral  Resp:  14  14  Height:      Weight:      SpO2: 97% 98% 96% 97%    Last BM Date: 04/24/16  Intake/Output   Yesterday:  05/02 0701 - 05/03 0700 In: 1960 [I.V.:360; NG/GT:1600] Out: 3050 [Urine:2800; Stool:250] This shift:  Total I/O In: -  Out: 700 [Urine:700]   Physical Exam: General: Pt awake/alert/oriented x4 in no acute distress Chest: on trach collar CV:  Pulses intact.  Regular rhythm Abdomen: Soft.  Nondistended. Midline wound is open, fascia intact, retention sutures in place, llq ostomy site is clean.  ruq stoma is pink and viable.  No evidence of peritonitis.  No incarcerated hernias. Ext:  SCDs BLE.  Generalized edema.  No cyanosis Skin: No petechiae / purpura   Problem List:   Active Problems:   Infection of colostomy stoma (HCC)   Colocutaneous fistula   Ventilator dependence ([facility]) post exp lap with open abd   Hypothyroid   Morbid obesity ([facility])   Open wound of abdomen   Pressure ulcer   Acute respiratory failure ([facility])    Results:   Labs: Results for orders placed or performed during the hospital encounter of 03/28/16 (from the past 48 hour(s))  Glucose, capillary     Status: None   Collection Time: 04/23/16  7:53 PM  Result Value Ref Range   Glucose-Capillary 77 65 - 99 mg/dL   Comment 1 Notify RN    Comment 2 Document in Chart   Glucose, capillary     Status: None   Collection Time:  04/24/16 12:00 AM  Result Value Ref Range   Glucose-Capillary 74 65 - 99 mg/dL   Comment 1 Notify RN    Comment 2 Document in Chart   Renal function panel     Status: Abnormal   Collection Time: 04/24/16  3:11 AM  Result Value Ref Range   Sodium 133 (L) 135 - 145 mmol/L   Potassium 3.9 3.5 - 5.1 mmol/L   Chloride 97 (L) 101 - 111 mmol/L   CO2 27 22 - 32 mmol/L   Glucose, Bld 82 65 - 99 mg/dL   BUN 23 (H) 6 - 20 mg/dL   Creatinine, Ser 0.89 0.44 - 1.00 mg/dL   Calcium 8.1 (L) 8.9 - 10.3 mg/dL   Phosphorus 3.7 2.5 - 4.6 mg/dL   Albumin 2.0 (L) 3.5 - 5.0 g/dL   GFR calc non Af Amer >60 >60 mL/min   GFR calc Af Amer >60 >60 mL/min    Comment: (NOTE) The eGFR has been calculated using the CKD EPI equation. This calculation has not been validated in all clinical situations. eGFR's persistently <60 mL/min signify possible Chronic Kidney Disease.    Anion gap 9 5 - 15  CBC with Differential/Platelet  Status: Abnormal   Collection Time: 04/24/16  3:11 AM  Result Value Ref Range   WBC 8.1 4.0 - 10.5 K/uL   RBC 3.12 (L) 3.87 - 5.11 MIL/uL   Hemoglobin 8.9 (L) 12.0 - 15.0 g/dL   HCT 29.0 (L) 36.0 - 46.0 %   MCV 92.9 78.0 - 100.0 fL   MCH 28.5 26.0 - 34.0 pg   MCHC 30.7 30.0 - 36.0 g/dL   RDW 17.2 (H) 11.5 - 15.5 %   Platelets 388 150 - 400 K/uL   Neutrophils Relative % 60 %   Neutro Abs 4.9 1.7 - 7.7 K/uL   Lymphocytes Relative 25 %   Lymphs Abs 2.0 0.7 - 4.0 K/uL   Monocytes Relative 10 %   Monocytes Absolute 0.8 0.1 - 1.0 K/uL   Eosinophils Relative 4 %   Eosinophils Absolute 0.3 0.0 - 0.7 K/uL   Basophils Relative 1 %   Basophils Absolute 0.0 0.0 - 0.1 K/uL  Glucose, capillary     Status: None   Collection Time: 04/24/16  4:36 AM  Result Value Ref Range   Glucose-Capillary 79 65 - 99 mg/dL   Comment 1 Notify RN    Comment 2 Document in Chart   Glucose, capillary     Status: None   Collection Time: 04/24/16  7:43 AM  Result Value Ref Range   Glucose-Capillary 82 65  - 99 mg/dL   Comment 1 Notify RN   Glucose, capillary     Status: None   Collection Time: 04/24/16 11:55 AM  Result Value Ref Range   Glucose-Capillary 87 65 - 99 mg/dL   Comment 1 Notify RN   Glucose, capillary     Status: None   Collection Time: 04/24/16  4:18 PM  Result Value Ref Range   Glucose-Capillary 92 65 - 99 mg/dL   Comment 1 Notify RN   Glucose, capillary     Status: None   Collection Time: 04/24/16  8:57 PM  Result Value Ref Range   Glucose-Capillary 87 65 - 99 mg/dL  Glucose, capillary     Status: None   Collection Time: 04/24/16 11:51 PM  Result Value Ref Range   Glucose-Capillary 89 65 - 99 mg/dL   Comment 1 Notify RN   CBC with Differential/Platelet     Status: Abnormal   Collection Time: 04/25/16  2:40 AM  Result Value Ref Range   WBC 7.6 4.0 - 10.5 K/uL   RBC 3.09 (L) 3.87 - 5.11 MIL/uL   Hemoglobin 8.6 (L) 12.0 - 15.0 g/dL   HCT 28.3 (L) 36.0 - 46.0 %   MCV 91.6 78.0 - 100.0 fL   MCH 27.8 26.0 - 34.0 pg   MCHC 30.4 30.0 - 36.0 g/dL   RDW 17.3 (H) 11.5 - 15.5 %   Platelets 398 150 - 400 K/uL   Neutrophils Relative % 61 %   Neutro Abs 4.6 1.7 - 7.7 K/uL   Lymphocytes Relative 28 %   Lymphs Abs 2.1 0.7 - 4.0 K/uL   Monocytes Relative 8 %   Monocytes Absolute 0.6 0.1 - 1.0 K/uL   Eosinophils Relative 3 %   Eosinophils Absolute 0.3 0.0 - 0.7 K/uL   Basophils Relative 0 %   Basophils Absolute 0.0 0.0 - 0.1 K/uL  Renal function panel     Status: Abnormal   Collection Time: 04/25/16  2:48 AM  Result Value Ref Range   Sodium 134 (L) 135 - 145 mmol/L   Potassium 3.7  3.5 - 5.1 mmol/L   Chloride 95 (L) 101 - 111 mmol/L   CO2 30 22 - 32 mmol/L   Glucose, Bld 109 (H) 65 - 99 mg/dL   BUN 25 (H) 6 - 20 mg/dL   Creatinine, Ser 0.93 0.44 - 1.00 mg/dL   Calcium 7.9 (L) 8.9 - 10.3 mg/dL   Phosphorus 3.2 2.5 - 4.6 mg/dL   Albumin 2.0 (L) 3.5 - 5.0 g/dL   GFR calc non Af Amer >60 >60 mL/min   GFR calc Af Amer >60 >60 mL/min    Comment: (NOTE) The eGFR has been  calculated using the CKD EPI equation. This calculation has not been validated in all clinical situations. eGFR's persistently <60 mL/min signify possible Chronic Kidney Disease.    Anion gap 9 5 - 15  Glucose, capillary     Status: Abnormal   Collection Time: 04/25/16  4:27 AM  Result Value Ref Range   Glucose-Capillary 104 (H) 65 - 99 mg/dL   Comment 1 Notify RN   Glucose, capillary     Status: Abnormal   Collection Time: 04/25/16  8:30 AM  Result Value Ref Range   Glucose-Capillary 102 (H) 65 - 99 mg/dL   Comment 1 Notify RN   BMET in AM     Status: Abnormal   Collection Time: 04/25/16  9:43 AM  Result Value Ref Range   Sodium 134 (L) 135 - 145 mmol/L   Potassium 4.7 3.5 - 5.1 mmol/L   Chloride 96 (L) 101 - 111 mmol/L   CO2 29 22 - 32 mmol/L   Glucose, Bld 106 (H) 65 - 99 mg/dL   BUN 25 (H) 6 - 20 mg/dL   Creatinine, Ser 0.90 0.44 - 1.00 mg/dL   Calcium 8.3 (L) 8.9 - 10.3 mg/dL   GFR calc non Af Amer >60 >60 mL/min   GFR calc Af Amer >60 >60 mL/min    Comment: (NOTE) The eGFR has been calculated using the CKD EPI equation. This calculation has not been validated in all clinical situations. eGFR's persistently <60 mL/min signify possible Chronic Kidney Disease.    Anion gap 9 5 - 15  Glucose, capillary     Status: Abnormal   Collection Time: 04/25/16 12:07 PM  Result Value Ref Range   Glucose-Capillary 103 (H) 65 - 99 mg/dL   Comment 1 Notify RN     Imaging / Studies: No results found.  Medications / Allergies:  Scheduled Meds: . sodium chloride   Intravenous Once  . antiseptic oral rinse  7 mL Mouth Rinse q12n4p  . arformoterol  15 mcg Nebulization BID  . budesonide (PULMICORT) nebulizer solution  0.5 mg Nebulization BID  . chlorhexidine gluconate (SAGE KIT)  15 mL Mouth Rinse BID  . enoxaparin (LOVENOX) injection  40 mg Subcutaneous Q24H  . fentaNYL  100 mcg Transdermal Q72H  . furosemide  20 mg Intravenous Q12H  . levothyroxine  200 mcg Oral QAC breakfast   . pantoprazole sodium  40 mg Per Tube Daily  . sodium chloride flush  10-40 mL Intracatheter Q12H   Continuous Infusions: . sodium chloride 10 mL/hr at 04/24/16 0400  . feeding supplement (VITAL HIGH PROTEIN) 1,000 mL (04/25/16 0622)   PRN Meds:.acetaminophen (TYLENOL) oral liquid 160 mg/5 mL, albuterol, fentaNYL (SUBLIMAZE) injection, iopamidol, [DISCONTINUED] ondansetron **OR** ondansetron (ZOFRAN) IV, oxyCODONE, sodium chloride flush  Antibiotics: Anti-infectives    Start     Dose/Rate Route Frequency Ordered Stop   04/18/16 1000  ciprofloxacin (CIPRO) IVPB  400 mg  Status:  Discontinued     400 mg 200 mL/hr over 60 Minutes Intravenous To Short Stay 04/17/16 1026 04/18/16 1340   04/03/16 2100  vancomycin (VANCOCIN) 1,500 mg in sodium chloride 0.9 % 500 mL IVPB  Status:  Discontinued     1,500 mg 250 mL/hr over 120 Minutes Intravenous Every 12 hours 04/02/16 2020 04/06/16 0943   04/02/16 2030  vancomycin (VANCOCIN) 2,500 mg in sodium chloride 0.9 % 500 mL IVPB     2,500 mg 250 mL/hr over 120 Minutes Intravenous  Once 04/02/16 2020 04/02/16 2306   03/29/16 1200  ciprofloxacin (CIPRO) IVPB 400 mg  Status:  Discontinued     400 mg 200 mL/hr over 60 Minutes Intravenous Every 12 hours 03/29/16 0213 04/06/16 0943   03/29/16 0600  [MAR Hold]  ciprofloxacin (CIPRO) IVPB 400 mg     (MAR Hold since 03/28/16 2357)   400 mg 200 mL/hr over 60 Minutes Intravenous On call to O.R. 03/28/16 2306 03/28/16 2355   03/29/16 0600  [MAR Hold]  metroNIDAZOLE (FLAGYL) IVPB 500 mg     (MAR Hold since 03/28/16 2357)   500 mg 100 mL/hr over 60 Minutes Intravenous On call to O.R. 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 Wyatt(03/29/16) Left colectomy and end colostomy--Dr. Donne Hazel 03/30/16 Re-exploration of open abdomen and application of wound vac--Dr. Redmond Pulling 04/02/16 Placement of ABRA abdominal wall closure device(04/05/16)  Removal of ABRA device and closure of abdominal wall(04/18/16) -continue TF via cortrek, SLP eval and treat, encourage PO intake.  -PT/OT recommending inpatient rehab, following -continue BID wet to dry dressing changes to previous ostomy site and midline wound. -continue with retention sutures Septic shock-resolved AKI-resolved Anemia of critical illness-stable Hypothyroidism-home meds Acute respiratory failure-s/p trach, down sizing per CCM, appreciate assistance VTE prophylaxis-SCD/lovenox PCM-off TPN, tolerating TF at goal Dispo-transfer to 3S when bed available     Erby Pian, [facility] Surgery Pager [phone](7A-4:30P) For consults and floor pages call [phone](7A-4:30P)  04/25/2016 1:21 PM

## 2016-04-25 NOTE — Progress Notes (Signed)
Nutrition Consult/Follow Up  DOCUMENTATION CODES:   Morbid obesity  INTERVENTION:   Continue Vital High Protein formula at goal rate of 75 ml/hr  TF regimen providing 1800 kcals, 157 gm protein, 1505 ml of free water daily  NUTRITION DIAGNOSIS:   Inadequate oral intake now related to poor appetite, altered GI function as evidenced by meal completion < 25%, ongoing  GOAL:   Patient will meet greater than or equal to 90% of their needs, met  MONITOR:   TF tolerance, PO intake, Labs, Weight trends, Skin, I & O's  ASSESSMENT:   60 y.o. Female presented with postoperative complication. 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 She went to a nearby hospital and was diagnosed with perforated diverticulitis. She underwent an exploratory laparotomy with sigmoid colon resection on March 20. She was discharged from the hospital there after a week and just returned home to [place] 5 days ago. Yesterday she reports developing leakage of fecal matter from her midline surgical incision site. She continues to have normal ostomy output from her left lower quadrant stoma. Over the last 24 hours she has also experienced increased generalized abdominal pain.  Patient s/p procedure 4/6: COLOSTOMY REMOVAL AND END STAPLING OF STOMA PARTIAL COLECTOMY EXPLORATORY LAPAROTOMY OMENTECTOMY DEBRIDEMENT WOUND  Trach placed 4/13. Removal of ABRA device, closure of abdominal wall 4/27.  TPN discontinued 5/1. Pt remains on trach collar. Trach downsized to #6 cuffless 5/2. Continues on a Dysphagia 2, thin liquids diet >> pt not eating. Vital HP formula infusing at goal rate of 75 ml/hr via CORTRAK tube providing 1800 kcals, 157 gm protein, 1505 ml of free water daily >> tolerating well.  Diet Order:  DIET DYS 2 Room service appropriate?: Yes; Fluid consistency:: Thin  Skin:  Wound (see comment) (abdominal wound VAC)  Last  BM:  5/2  Height:   Ht Readings from Last 1 Encounters:  04/13/16 _0  (1.676 m)    Weight:   Wt Readings from Last 1 Encounters:  04/25/16 309 lb 8.4 oz (140.4 kg)    Ideal Body Weight:  59 kg  BMI:  Body mass index is 49.98 kg/(m^2).  Estimated Nutritional Needs:   Kcal:  1800-2000  Protein:  145-155 gm  Fluid:  per MD  EDUCATION NEEDS:   No education needs identified at this time  Arthur Holms, RD, LDN Pager #: [phone] After-Hours Pager #: [phone]

## 2016-04-25 NOTE — Clinical Social Work Note (Signed)
Clinical Social Work Assessment  Patient Details  Name: Christina Castaneda MRN: [id number] Date of Birth: 1956/04/09  Date of referral:  04/25/16               Reason for consult:  Discharge Planning                Permission sought to share information with:  Chartered certified accountant granted to share information::  Yes, Verbal Permission Granted  Name::     Radio producer::  SNFs  Relationship::  Husband  Contact Information:     Housing/Transportation Living arrangements for the past 2 months:  Single Family Home Source of Information:  Patient, Spouse Patient Interpreter Needed:  None Criminal Activity/Legal Involvement Pertinent to Current Situation/Hospitalization:  No - Comment as needed Significant Relationships:    Lives with:  Spouse Do you feel safe going back to the place where you live?  Yes Need for family participation in patient care:  Yes (Comment)  Care giving concerns:  The patient is aggregable for short term rehab at discharge. Patient would like to rebuild her strength to return home.    Social Worker assessment / plan:  CSW met with patient at beside to complete assessment. Patient was resting comfortably in bed. Patient's husband was also at bedside.  CSW explained PT recommendation for CIR placement. CSW explained that alternate plan of SNF.  CSW explained SNF search and placement process to the patient and patient's husband and answered their questions. The patient and patient's husband request the CSW pursue alternate SNF plan. CSW will follow up with bed offers.   Employment status:  Retired Advertising copywriter PT Recommendations:  Inpatient [facility] / Referral to community resources:  [place]  Patient/Family's Response to care:  The patient appears happy with the care she is receiving in hospital and is appreciative of CSW assistance.  Patient/Family's Understanding of and  Emotional Response to Diagnosis, Current Treatment, and Prognosis:  The patient has a good understanding of why the patient was admitted. She understands the care plan and what she will need post discharge.  Emotional Assessment Appearance:  Appears stated age Attitude/Demeanor/Rapport:   (Patient was welcoming of CSW and appropriate. ) Affect (typically observed):  Calm, Appropriate, Accepting Orientation:  Oriented to Self, Oriented to Place, Oriented to  Time, Oriented to Situation Alcohol / Substance use:  Not Applicable Psych involvement (Current and /or in the community):  No (Comment)  Discharge Needs  Concerns to be addressed:  Discharge Planning Concerns Readmission within the last 30 days:  No Current discharge risk:  Physical Impairment Barriers to Discharge:  Continued Medical Work up   [employer], LCSW [phone]   04/25/2016, 1:56 PM

## 2016-04-25 NOTE — [place] FL2 (Signed)
[place] MEDICAID FL2 LEVEL OF CARE SCREENING TOOL     IDENTIFICATION  Patient Name: Christina Castaneda Birthdate: 08-28-1956 Sex: female Admission Date (Current Location): 03/28/2016  [facility] and [place] Number:  Herbalist and Address:  [facility], [place] [address] [place], [place] [address]      Provider Number: [id number]  Attending Physician Name and Address:  Md Edison Pace, MD  Relative Name and Phone Number:       Current Level of Care: SNF Recommended Level of Care: [place] Prior Approval Number:    Date Approved/Denied:   PASRR Number: [id number]  Discharge Plan: SNF    Current Diagnoses: Patient Active Problem List   Diagnosis Date Noted  . Tracheostomy care ()   . Chronic obstructive pulmonary disease ([place])   . Tobacco abuse   . Essential hypertension   . Bilateral knee pain   . Dysphagia   . Acute blood loss anemia   . Hyponatremia   . Thyroid activity decreased   . Acute respiratory failure ([place])   . Pressure ulcer 04/01/2016  . 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 Ventilator dependence ([place]) post exp lap with open abd 03/29/2016  . Hypothyroid 03/29/2016  . Morbid obesity ([place]) 03/29/2016  . Open wound of abdomen 03/29/2016  . Infection of colostomy stoma ([place]) 03/28/2016  . 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 Obesity 11/12/2012    Orientation RESPIRATION BLADDER Height & Weight     Self, Time, Situation, Place  Tracheostomy ( [place], Uncuffed 6) Indwelling catheter Weight: (!) 309 lb 8.4 oz (140.4 kg) Height:  _0  (167.6 cm)  BEHAVIORAL SYMPTOMS/MOOD NEUROLOGICAL BOWEL NUTRITION STATUS   (None)  (None) Colostomy Diet (DYS2)  AMBULATORY STATUS COMMUNICATION OF NEEDS Skin   Extensive Assist Verbally Surgical wounds, Other (Comment), PU Stage and Appropriate Care (Incision: LT Lower Anterior, Mid Anterior Medial) PU Stage 1 Dressing: No Dressing                     Personal Care  Assistance Level of Assistance  Bathing, Feeding, Dressing Bathing Assistance: Limited assistance Feeding assistance: Independent Dressing Assistance: Limited assistance     Functional Limitations Info  Sight, Hearing, Speech Sight Info: Adequate Hearing Info: Adequate Speech Info: Adequate    SPECIAL CARE FACTORS FREQUENCY  PT (By licensed PT), OT (By licensed OT)     PT Frequency: 5/ week OT Frequency: 5/ week            Contractures Contractures Info: Not present    Additional Factors Info  Code Status Code Status Info: Full  Allergies Info: Penicillins           Current Medications (04/25/2016):  This is the current hospital active medication list Current Facility-Administered Medications  Medication Dose Route Frequency Provider Last Rate Last Dose  . 0.9 %  sodium chloride infusion   Intravenous Continuous Laverle Hobby, MD 10 mL/hr at 04/25/16 1600 10 mL/hr at 04/25/16 1600  . 0.9 %  sodium chloride infusion   Intravenous Once Colbert Coyer, MD 10 mL/hr at 04/25/16 1600 10 mL/hr at 04/25/16 1600  . acetaminophen (TYLENOL) solution 650 mg  650 mg Per Tube Q6H PRN Colbert Coyer, MD   650 mg at 04/01/16 0100  . albuterol (PROVENTIL) (2.5 MG/3ML) 0.083% nebulizer solution 2.5 mg  2.5 mg Nebulization Q4H PRN Judeth Horn, MD   2.5 mg at 04/23/16 1834  . antiseptic oral rinse (CPC / CETYLPYRIDINIUM  CHLORIDE 0.05%) solution 7 mL  7 mL Mouth Rinse q12n4p Greer Pickerel, MD   7 mL at 04/25/16 1600  . arformoterol (BROVANA) nebulizer solution 15 mcg  15 mcg Nebulization BID Chesley Mires, MD   15 mcg at 04/25/16 0827  . budesonide (PULMICORT) nebulizer solution 0.5 mg  0.5 mg Nebulization BID Donita Brooks, NP   0.5 mg at 04/25/16 0827  . chlorhexidine gluconate (SAGE KIT) (PERIDEX) 0.12 % solution 15 mL  15 mL Mouth Rinse BID Judeth Horn, MD   15 mL at 04/25/16 0921  . enoxaparin (LOVENOX) injection 40 mg  40 mg Subcutaneous Q24H Judeth Horn, MD   40 mg at  04/24/16 2200  . feeding supplement (VITAL HIGH PROTEIN) liquid 1,000 mL  1,000 mL Per Tube Continuous Donnie Mesa, MD 75 mL/hr at 04/25/16 1600 1,000 mL at 04/25/16 1600  . fentaNYL (DURAGESIC - dosed mcg/hr) 100 mcg  100 mcg Transdermal Q72H Rush Farmer, MD   100 mcg at 04/25/16 8003  . fentaNYL (SUBLIMAZE) injection 25-100 mcg  25-100 mcg Intravenous Q2H PRN Rigoberto Noel, MD   100 mcg at 04/25/16 1252  . furosemide (LASIX) injection 20 mg  20 mg Intravenous Q12H Kara Mead V, MD   20 mg at 04/25/16 0921  . iopamidol (ISOVUE-300) 61 % injection 50 mL  50 mL Other Once PRN Greer Pickerel, MD      . levothyroxine (SYNTHROID, LEVOTHROID) tablet 200 mcg  200 mcg Oral QAC breakfast Donita Brooks, NP   200 mcg at 04/25/16 0921  . ondansetron (ZOFRAN) injection 4 mg  4 mg Intravenous Q6H PRN Judeth Horn, MD   4 mg at 04/18/16 2300  . oxyCODONE (ROXICODONE) 5 MG/5ML solution 5-10 mg  5-10 mg Oral Q4H PRN Juanito Doom, MD   10 mg at 04/25/16 1512  . pantoprazole sodium (PROTONIX) 40 mg/20 mL oral suspension 40 mg  40 mg Per Tube Daily Lyndee Leo, RPH   40 mg at 04/25/16 4917  . sodium chloride flush (NS) 0.9 % injection 10-40 mL  10-40 mL Intracatheter Q12H Collene Gobble, MD   10 mL at 04/24/16 2250  . sodium chloride flush (NS) 0.9 % injection 10-40 mL  10-40 mL Intracatheter PRN Collene Gobble, MD         Discharge Medications: Please see discharge summary for a list of discharge medications.  Relevant Imaging Results:  Relevant Lab Results:   Additional Information HXT:[id number]  Samule Dry, LCSW

## 2016-04-25 NOTE — Progress Notes (Signed)
SLP Cancellation Note  Patient Details Name: ISHANVI THIELKE MRN: [id number] DOB: 08-20-1956   Cancelled treatment:       Reason Eval/Treat Not Completed: Other (comment) Pt currently working with other providers. Will f/u as able.   Germain Osgood, M.A. CCC-SLP [phone]  Germain Osgood 04/25/2016, 11:46 AM

## 2016-04-25 NOTE — Progress Notes (Signed)
[facility] ADULT ICU REPLACEMENT PROTOCOL FOR AM LAB REPLACEMENT ONLY  The patient does apply for the [facility] Adult ICU Electrolyte Replacment Protocol based on the criteria listed below:   1. Is GFR >/= 40 ml/min? Yes.    Patient's GFR today is >60 2. Is urine output >/= 0.5 ml/kg/hr for the last 6 hours? Yes.   Patient's UOP is 0.64 ml/kg/hr 3. Is BUN < 60 mg/dL? Yes.    Patient's BUN today is 25 4. Abnormal electrolyte  K 3.7 5. Ordered repletion with: per protocol 6. If a panic level lab has been reported, has the CCM MD in charge been notified? Yes.  .   Physician:  Lang Snow, Canary Brim 04/25/2016 4:16 AM

## 2016-04-25 NOTE — Progress Notes (Signed)
Physical Therapy Treatment Patient Details Name: Christina Castaneda MRN: [id number] DOB: 03/04/1956 Today's Date: 04/25/2016    History of Present Illness Pt adm with Necrotic stoma with peritonitis and fascial necrosis after recent colostomy in [place]. Underwent S/p laparotomy, colostomy removal and end stapling of stoma, partial colectomy, omentectomy, debridement of wound--Dr. Hulen Skains, subsequent RUQ stoma and washout, subsequent ABRA placement. Underwent Removal of ABRA device, closure of abdominal wall with retention sutures on 04/18/16. PMH -HTN, gout, obesity, COPD    PT Comments    Pt able to participate in mobility with coming to sitting EOB.  Pt needs encouragement for attempting to increase overall activity and sitting time.  At this time feel pt would benefit from continued therapy at [facility].  Will continue to follow.    Follow Up Recommendations  CIR     Equipment Recommendations  None recommended by PT    Recommendations for Other Services       Precautions / Restrictions Precautions Precautions: Fall Precaution Comments: Abdominal binder Restrictions Weight Bearing Restrictions: No    Mobility  Bed Mobility Overal bed mobility: +2 for physical assistance;Needs Assistance Bed Mobility: Rolling;Sidelying to Sit;Sit to Sidelying Rolling: Mod assist;+2 for safety/equipment Sidelying to sit: Mod assist;+2 for physical assistance     Sit to sidelying: Mod assist;+2 for physical assistance General bed mobility comments: Assist for legs and to elevate trunk. Assist to lower trunk and to bring legs back up into bed. Pt able to assist with UEs and LEs with repositioning up in bed with bed inverted.  Transfers                 General transfer comment: pt declined to attempt this visit.  Ambulation/Gait                 Stairs            Wheelchair Mobility    Modified Rankin (Stroke Patients Only)       Balance Overall balance assessment: Needs  assistance Sitting-balance support: Feet supported;Bilateral upper extremity supported Sitting balance-Leahy Scale: Poor Sitting balance - Comments: sat EOB x 8 minutes before requesting to return to supine. Postural control: Posterior lean                          Cognition Arousal/Alertness: Awake/alert Behavior During Therapy: WFL for tasks assessed/performed;Anxious Overall Cognitive Status: Within Functional Limits for tasks assessed                      Exercises      General Comments        Pertinent Vitals/Pain Pain Assessment: 0-10 Pain Score: 5  Pain Location: Abdomen Pain Descriptors / Indicators: Aching;Grimacing Pain Intervention(s): Monitored during session;Repositioned;Patient requesting pain meds-RN notified    Home Living                      Prior Function            PT Goals (current goals can now be found in the care plan section) Acute Rehab PT Goals Patient Stated Goal: Return home PT Goal Formulation: With patient Time For Goal Achievement: 05/04/16 Potential to Achieve Goals: Good Progress towards PT goals: Progressing toward goals    Frequency  Min 3X/week    PT Plan Current plan remains appropriate    Co-evaluation PT/OT/SLP Co-Evaluation/Treatment: Yes Reason for Co-Treatment: Complexity of the patient's impairments (multi-system involvement);For patient/therapist safety PT  goals addressed during session: Mobility/safety with mobility;Balance       End of Session Equipment Utilized During Treatment: Gait belt;Oxygen (Trach collar) Activity Tolerance: Patient limited by fatigue Patient left: in bed;with call bell/phone within reach;with family/visitor present     Time: [id number] PT Time Calculation (min) (ACUTE ONLY): 35 min  Charges:  $Therapeutic Activity: 8-22 mins                    G CodesCatarina Hartshorn, [place] 04/25/2016, 3:44 PM

## 2016-04-25 NOTE — Progress Notes (Signed)
Occupational Therapy Treatment Patient Details Name: Christina Castaneda MRN: [id number] DOB: 02/07/1956 Today's Date: 04/25/2016    History of present illness Pt adm with Necrotic stoma with peritonitis and fascial necrosis after recent colostomy in [place]. Underwent S/p laparotomy, colostomy removal and end stapling of stoma, partial colectomy, omentectomy, debridement of wound--Dr. Hulen Skains, subsequent RUQ stoma and washout, subsequent ABRA placement. Underwent Removal of ABRA device, closure of abdominal wall with retention sutures on 04/18/16. PMH -HTN, gout, obesity, COPD   OT comments  Pt communicating with PSMV throughout session. Focus of session on bed mobility, sitting balance/tolerance at EOB x 8 minutes. Pt performing 2 grooming tasks at EOB with min assist. Husband present throughout session. Reports pt was walking household distances prior to admission. Pt's VSS throughout session. Pt grateful for therapy at end of session. Will continue to follow.  Follow Up Recommendations  CIR    Equipment Recommendations       Recommendations for Other Services      Precautions / Restrictions Precautions Precautions: Fall Precaution Comments: Abdominal binder Restrictions Weight Bearing Restrictions: No       Mobility Bed Mobility Overal bed mobility: +2 for physical assistance;Needs Assistance Bed Mobility: Rolling;Sidelying to Sit;Sit to Sidelying Rolling: Mod assist;+2 for safety/equipment Sidelying to sit: Mod assist;+2 for physical assistance     Sit to sidelying: Mod assist;+2 for physical assistance General bed mobility comments: Assist for legs and to elevate trunk. Assist to lower trunk and to bring legs back up into bed. Pt able to assist with UEs and LEs with repositioning up in bed with bed inverted.  Transfers                 General transfer comment: deferred this visit    Balance Overall balance assessment: Needs assistance Sitting-balance support: Feet  supported Sitting balance-Leahy Scale: Poor Sitting balance - Comments: sat EOB x 8 minutes before requesting to return to supine. Postural control: Posterior lean                         ADL Overall ADL's : Needs assistance/impaired     Grooming: Wash/dry hands;Oral care;Sitting;Minimal assistance     Upper Body Bathing Details (indicate cue type and reason): washed pt's back while in sitting         Lower Body Dressing: Total assistance;Bed level Lower Body Dressing Details (indicate cue type and reason): socks                      Vision                     Perception     Praxis      Cognition   Behavior During Therapy: [facility] for tasks assessed/performed;Anxious Overall Cognitive Status: Within Functional Limits for tasks assessed                       Extremity/Trunk Assessment               Exercises     Shoulder Instructions       General Comments      Pertinent Vitals/ Pain       Pain Assessment: 0-10 Pain Score: 5  Pain Location: abdomen Pain Descriptors / Indicators: Aching;Grimacing Pain Intervention(s): Monitored during session;Premedicated before session;Repositioned;Limited activity within patient's tolerance  Home Living  Prior Functioning/Environment              Frequency Min 2X/week     Progress Toward Goals  OT Goals(current goals can now be found in the care plan section)  Progress towards OT goals: Progressing toward goals  Acute Rehab OT Goals Patient Stated Goal: Return home Time For Goal Achievement: 05/04/16 Potential to Achieve Goals: Good  Plan Discharge plan remains appropriate    Co-evaluation    PT/OT/SLP Co-Evaluation/Treatment: Yes Reason for Co-Treatment: Complexity of the patient's impairments (multi-system involvement);For patient/therapist safety   OT goals addressed during session: ADL's and self-care       End of Session Equipment Utilized During Treatment:  (28%)   Activity Tolerance Patient limited by fatigue (anxiety)   Patient Left in bed;with call bell/phone within reach;with family/visitor present   Nurse Communication  (pt wanting suctioning)        Time: 1003-1036 OT Time Calculation (min): 33 min  Charges: OT General Charges $OT Visit: 1 Procedure OT Treatments $Self Care/Home Management : 8-22 mins  Malka So 04/25/2016, 10:45 AM  [phone]

## 2016-04-25 NOTE — Consult Note (Signed)
Physical Medicine and Rehabilitation Consult Reason for Consult: Debilitation/multi-medical Referring Physician: CCS   HPI: Christina Castaneda is a 60 y.o. right handed female with history of COPD, tobacco abuse and hypertension. Patient lives with spouse independent prior to admission also sedentary due to bilateral knee pain. Two-level home with bedroom on main floor. Patient with complicated history. Patient is a long distance truck driver and while driving to [place] in March when she developed severe abdominal pain went to a nearby hospital diagnosed with perforated diverticulitis. She underwent exploratory laparotomy with sigmoid colon resection/colostomy on March 20. She was later discharged from that hospital returned to home in [facility]. Developed leakage of fecal matter from her midline surgical incision site as well as persistent nausea and generalized abdominal pain. Noted low-grade fever. Admitted to [facility] on 03/28/2016 and underwent CT abdomen and pelvis showed no bowel obstruction or intra-abdominal abscess. Air with ill-defined fluid in the subjacent subcutaneous tissues. Underwent colostomy removal and in stapling stoma, partial colectomy exploratory laparotomy omentectomy and debridement of wound 03/29/2016. A wound VAC was applied and later underwent placement ABRA abdominal closure set of open abdomen 04/04/2016 as well as reexploration of abdomen and finally with removal of ABRA device and closure of abdominal wound 04/18/2016. Hospital course complicated by acute respiratory failure with vent management and patient had undergone tracheosto (my 04/05/2016 and changed to a #6, cuffless trach 04/24/2016 per pulmonary services. Currently on a dysphagia 2 thin liquid diet. Subcutaneous Lovenox for DVT prophylaxis. Physical occupational therapy evaluations completed with recommendations of physical medicine rehabilitation consult.   Review of Systems    Constitutional: Positive for fever. Negative for chills.  HENT: Negative for hearing loss.   Eyes: Negative for blurred vision and double vision.  Respiratory: Positive for cough and shortness of breath.   Cardiovascular: Positive for leg swelling. Negative for chest pain and palpitations.  Gastrointestinal: Positive for nausea, vomiting, abdominal pain and constipation.  Genitourinary: Negative for dysuria and hematuria.  Musculoskeletal: Positive for myalgias and joint pain.  Skin: Negative for rash.  Neurological: Positive for weakness. Negative for seizures and headaches.  All other systems reviewed and are negative.  Past Medical History  Diagnosis Date  . Goiter   . Hypothyroidism   . Asthma     mild  . Hypertension   . Atypical endometrial hyperplasia   . Heart murmur   . Vitamin B12 deficiency   . Yeast infection     for last 3 weeks  . Gout   . COPD (chronic obstructive pulmonary disease) [facility])    Past Surgical History  Procedure Laterality Date  . Neck surgery  1994    repair disk  . Tonsillectomy      as a child  . Cholecystectomy  2009    gallstone removed  . Robotic assisted total hysterectomy with bilateral salpingo oopherectomy  11/18/2012    Procedure: ROBOTIC ASSISTED TOTAL HYSTERECTOMY WITH BILATERAL SALPINGO OOPHORECTOMY;  Surgeon: Imagene Gurney A. Alycia Rossetti, MD;  Location: [facility];  Service: Gynecology;  Laterality: N/A;  . Colostomy revision N/A 03/28/2016    Procedure: COLOSTOMY REVISION;  Surgeon: Judeth Horn, MD;  Location: [place];  Service: General;  Laterality: N/A;  . Laparotomy N/A 03/28/2016    Procedure: EXPLORATORY LAPAROTOMY;  Surgeon: Judeth Horn, MD;  Location: [facility];  Service: General;  Laterality: N/A;  . Omentectomy N/A 03/28/2016    Procedure: OMENTECTOMY;  Surgeon: Judeth Horn, MD;  Location: [place];  Service:  General;  Laterality: N/A;  . Wound debridement N/A 03/28/2016    Procedure: DEBRIDEMENT WOUND;  Surgeon: Judeth Horn, MD;  Location: [place];   Service: General;  Laterality: N/A;  . Vacuum assisted closure change N/A 03/30/2016    Procedure: ABDOMINAL VAC CHANGE;  Surgeon: Rolm Bookbinder, MD;  Location: [place];  Service: General;  Laterality: N/A;  . Colostomy N/A 03/30/2016    Procedure: COLOSTOMY;  Surgeon: Rolm Bookbinder, MD;  Location: [place];  Service: General;  Laterality: N/A;  . Colostomy revision N/A 03/30/2016    Procedure: COLON RESECTION LEFT;  Surgeon: Rolm Bookbinder, MD;  Location: [place];  Service: General;  Laterality: N/A;  . Laparotomy N/A 04/02/2016    Procedure: Re-exploration of open abdomen, application of abdominal wound vac;  Surgeon: Greer Pickerel, MD;  Location: [place];  Service: General;  Laterality: N/A;  . Application of wound vac N/A 04/02/2016    Procedure: application of wound vac+;  Surgeon: Greer Pickerel, MD;  Location: [place];  Service: General;  Laterality: N/A;  . Laparotomy N/A 04/04/2016    Procedure: EXPLORATORY LAPAROTOMY, PLACEMENT OF ABRA ABDOMINAL WALL CLOSURE SET ;  Surgeon: Greer Pickerel, MD;  Location: [place];  Service: General;  Laterality: N/A;  . Laparotomy N/A 04/18/2016    Procedure: EXPLORATORY LAPAROTOMY;  Surgeon: Erroll Luna, MD;  Location: [place];  Service: General;  Laterality: N/A;  . Abdominal wall defect repair N/A 04/18/2016    Procedure: CLOSURE OF ABDOMEN;  Surgeon: Erroll Luna, MD;  [facility];  Service: General;  Laterality: N/A;   Family History  Problem Relation Age of Onset  . COPD Mother   . Diabetes Mother   . Congestive Heart Failure Mother   . Diabetes Father   . Heart attack Father    Social History:  reports that she has been smoking Cigarettes.  She has a 28 pack-year smoking history. She has never used smokeless tobacco. She reports that she drinks about 0.5 - 1.5 oz of alcohol per week. She reports that she does not use illicit drugs. Allergies:  Allergies  Allergen Reactions  . Penicillins Anaphylaxis, Hives, Shortness Of Breath, Swelling and Other (See  Comments)    Angioedema   Medications Prior to Admission  Medication Sig Dispense Refill  . ADVAIR DISKUS 250-50 MCG/DOSE AEPB Once in the am & once at pm    . albuterol (PROVENTIL HFA;VENTOLIN HFA) 108 (90 BASE) MCG/ACT inhaler Inhale 2 puffs into the lungs every 6 (six) hours as needed. Wheezing and shortness of breath    . ergocalciferol (VITAMIN D2) 50000 UNITS capsule Take 50,000 Units by mouth once a week.    . Febuxostat (ULORIC) 80 MG TABS Take 80 mg by mouth daily before breakfast.     . levothyroxine (SYNTHROID, LEVOTHROID) 200 MCG tablet Take 200 mcg by mouth daily before breakfast.     . lisinopril (PRINIVIL,ZESTRIL) 20 MG tablet Take 20 mg by mouth daily.    Marland Kitchen nystatin cream (MYCOSTATIN) Apply topically 2 (two) times daily. For yeast infection      Home: Home Living Family/patient expects to be discharged to:: Private residence Living Arrangements: Spouse/significant other Available Help at Discharge: Family, Available PRN/intermittently Type of Home: House Home Access: Stairs to enter [employer] of Steps: 2 Entrance Stairs-Rails: Right Home Layout: Two level, Laundry or work area in basement, Able to live on main level with bedroom/bathroom Bathroom Shower/Tub: Chiropodist: [place]: Environmental consultant - 2 wheels, Bedside commode  Functional History: Prior Function Level of Independence: Independent Functional Status:  Mobility: Bed Mobility Overal bed mobility: +2 for physical assistance, Needs Assistance Bed Mobility: Rolling, Sidelying to Sit, Sit to Sidelying Rolling: Mod assist, +2 for safety/equipment Sidelying to sit: Mod assist, +2 for physical assistance Sit to sidelying: Mod assist, +2 for physical assistance General bed mobility comments: Assist for legs and to elevate trunk. Assist to lower trunk and to bring legs back up into bed. Pt able to assist with UEs and LEs with repositioning up in bed with bed  inverted. Transfers General transfer comment: deferred this visit      ADL: ADL Overall ADL's : Needs assistance/impaired Eating/Feeding: NPO Grooming: Wash/dry hands, Oral care, Sitting, Minimal assistance Upper Body Bathing: Moderate assistance, Sitting Upper Body Bathing Details (indicate cue type and reason): washed pt's back while in sitting Lower Body Bathing: Total assistance, Bed level Upper Body Dressing : Bed level, Moderate assistance Lower Body Dressing: Total assistance, Bed level Lower Body Dressing Details (indicate cue type and reason): socks Toileting- Clothing Manipulation and Hygiene: Total assistance, Bed level  Cognition: Cognition Overall Cognitive Status: Within Functional Limits for tasks assessed Orientation Level: Oriented X4 Cognition Arousal/Alertness: Awake/alert Behavior During Therapy: WFL for tasks assessed/performed, Anxious Overall Cognitive Status: Within Functional Limits for tasks assessed  Blood pressure 130/62, pulse 65, temperature 98.2 F (36.8 C), temperature source Oral, resp. rate 14, height 5\' 6"  (1.676 m), weight 140.4 kg (309 lb 8.4 oz), last menstrual period 11/18/2012, SpO2 98 %. Physical Exam  Vitals reviewed. Constitutional: She is oriented to person, place, and time. She appears well-developed and well-nourished.  HENT:  Head: Normocephalic and atraumatic.  Nasogastric tube in place  Eyes: Conjunctivae and EOM are normal.  Neck: Normal range of motion. Neck supple. No thyromegaly present.  Tracheostomy tube in place  Cardiovascular: Normal rate and regular rhythm.   Respiratory: Effort normal and breath sounds normal.  GI: Soft. Bowel sounds are normal.  Abdominal wound is dressed  Musculoskeletal: She exhibits edema. She exhibits no tenderness.  Neurological: She is alert and oriented to person, place, and time.  Motor: Bilateral upper extremities 4+/5 proximal to distal Bilateral lower extremities hip flexion, knee  extension 3 -/5, ankle dorsi/plantarflexion 4+/5 Sensation diminished to light touch bilateral feet  Skin: Skin is warm and dry.  Psychiatric: She has a normal mood and affect. Her behavior is normal.    Results for orders placed or performed during the hospital encounter of 03/28/16 (from the past 24 hour(s))  Glucose, capillary     Status: None   Collection Time: 04/24/16 11:55 AM  Result Value Ref Range   Glucose-Capillary 87 65 - 99 mg/dL   Comment 1 Notify RN   Glucose, capillary     Status: None   Collection Time: 04/24/16  4:18 PM  Result Value Ref Range   Glucose-Capillary 92 65 - 99 mg/dL   Comment 1 Notify RN   Glucose, capillary     Status: None   Collection Time: 04/24/16  8:57 PM  Result Value Ref Range   Glucose-Capillary 87 65 - 99 mg/dL  Glucose, capillary     Status: None   Collection Time: 04/24/16 11:51 PM  Result Value Ref Range   Glucose-Capillary 89 65 - 99 mg/dL   Comment 1 Notify RN   CBC with Differential/Platelet     Status: Abnormal   Collection Time: 04/25/16  2:40 AM  Result Value Ref Range   WBC 7.6 4.0 - 10.5  K/uL   RBC 3.09 (L) 3.87 - 5.11 MIL/uL   Hemoglobin 8.6 (L) 12.0 - 15.0 g/dL   HCT 28.3 (L) 36.0 - 46.0 %   MCV 91.6 78.0 - 100.0 fL   MCH 27.8 26.0 - 34.0 pg   MCHC 30.4 30.0 - 36.0 g/dL   RDW 17.3 (H) 11.5 - 15.5 %   Platelets 398 150 - 400 K/uL   Neutrophils Relative % 61 %   Neutro Abs 4.6 1.7 - 7.7 K/uL   Lymphocytes Relative 28 %   Lymphs Abs 2.1 0.7 - 4.0 K/uL   Monocytes Relative 8 %   Monocytes Absolute 0.6 0.1 - 1.0 K/uL   Eosinophils Relative 3 %   Eosinophils Absolute 0.3 0.0 - 0.7 K/uL   Basophils Relative 0 %   Basophils Absolute 0.0 0.0 - 0.1 K/uL  Renal function panel     Status: Abnormal   Collection Time: 04/25/16  2:48 AM  Result Value Ref Range   Sodium 134 (L) 135 - 145 mmol/L   Potassium 3.7 3.5 - 5.1 mmol/L   Chloride 95 (L) 101 - 111 mmol/L   CO2 30 22 - 32 mmol/L   Glucose, Bld 109 (H) 65 - 99 mg/dL    BUN 25 (H) 6 - 20 mg/dL   Creatinine, Ser 0.93 0.44 - 1.00 mg/dL   Calcium 7.9 (L) 8.9 - 10.3 mg/dL   Phosphorus 3.2 2.5 - 4.6 mg/dL   Albumin 2.0 (L) 3.5 - 5.0 g/dL   GFR calc non Af Amer >60 >60 mL/min   GFR calc Af Amer >60 >60 mL/min   Anion gap 9 5 - 15  Glucose, capillary     Status: Abnormal   Collection Time: 04/25/16  4:27 AM  Result Value Ref Range   Glucose-Capillary 104 (H) 65 - 99 mg/dL   Comment 1 Notify RN   Glucose, capillary     Status: Abnormal   Collection Time: 04/25/16  8:30 AM  Result Value Ref Range   Glucose-Capillary 102 (H) 65 - 99 mg/dL   Comment 1 Notify RN   BMET in AM     Status: Abnormal   Collection Time: 04/25/16  9:43 AM  Result Value Ref Range   Sodium 134 (L) 135 - 145 mmol/L   Potassium 4.7 3.5 - 5.1 mmol/L   Chloride 96 (L) 101 - 111 mmol/L   CO2 29 22 - 32 mmol/L   Glucose, Bld 106 (H) 65 - 99 mg/dL   BUN 25 (H) 6 - 20 mg/dL   Creatinine, Ser 0.90 0.44 - 1.00 mg/dL   Calcium 8.3 (L) 8.9 - 10.3 mg/dL   GFR calc non Af Amer >60 >60 mL/min   GFR calc Af Amer >60 >60 mL/min   Anion gap 9 5 - 15   No results found.  Assessment/Plan: Diagnosis: Debilitation/multi-medical Labs and images independently reviewed.  Records reviewed and summated above.  1. Does the need for close, 24 hr/day medical supervision in concert with the patient's rehab needs make it unreasonable for this patient to be served in a less intensive setting? Yes  2. Co-Morbidities requiring supervision/potential complications: COPD (monitor O2 sats and respiratory rate with increased physical activity), tobacco abuse (counsel), HTN (monitor and provide prns in accordance with increased physical exertion and pain),bilateral knee pain (ensure pain does not limit therapies), dysphagia (continue SLP, consider vital stim, advance diet as tolerated), ABLA (transfuse if necessary to ensure appropriate perfusion for increased activity tolerance), hyponatremia (cont to  monitor, consider  treatment if necessary), morbid obesity (Body mass index is 49.98 kg/(m^2)., Diet and exercise education, encourage weight loss to increase endurance and promote overall health), hypothyroidism (ensure appropriate mood and energy for therapies) 3. Due to bladder management, safety, skin/wound care, disease management and patient education, does the patient require 24 hr/day rehab nursing? Yes 4. Does the patient require coordinated care of a physician, rehab nurse, PT (1-2 hrs/day, 5 days/week), OT (1-2 hrs/day, 5 days/week) and SLP (1-2 hrs/day, 5 days/week) to address physical and functional deficits in the context of the above medical diagnosis(es)? Yes Addressing deficits in the following areas: balance, endurance, locomotion, strength, transferring, bowel/bladder control, bathing, dressing, grooming, toileting and psychosocial support 5. Can the patient actively participate in an intensive therapy program of at least 3 hrs of therapy per day at least 5 days per week? Potentially 6. The potential for patient to make measurable gains while on inpatient rehab is good 7. Anticipated functional outcomes upon discharge from inpatient rehab are TBD  with PT, TBD with OT, independent and modified independent with SLP. 8. Estimated rehab length of stay to reach the above functional goals is: 20-24 days. 9. Does the patient have adequate social supports and living environment to accommodate these discharge functional goals? No 10. Anticipated D/C setting: TBD 11. Anticipated post D/C treatments: HH therapy and Home excercise program 12. Overall Rehab/Functional Prognosis: good  RECOMMENDATIONS: This patient's condition is appropriate for continued rehabilitative care in the following setting: Will need to clarify caregiver support at discharge.  Patient will also need to participate more in therapies, additional to bed mobility. Patient has agreed to participate in recommended program. Yes Note that  insurance prior authorization may be required for reimbursement for recommended care.  Comment: Rehab Admissions Coordinator to follow up.  Delice Lesch, MD 04/25/2016

## 2016-04-25 NOTE — Consult Note (Addendum)
WOC ostomy follow up Stoma type/location: RUQ, end colostomy Stomal assessment/size: oval shaped, flush with skin along distal 1/2.  1 3/4" x 1 3/8", pink and moist Peristomal assessment: partial thickness skin ulceration noted at 3 o'clock, slight mucocutaneous separation at 9 o'clock  Treatment options for stomal/peristomal skin: 2" skin barrier ring Output thin, brown effluent Ostomy pouching: 1pc.flat with 2" barrier ring Education provided: demonstrated pouch change with husband and patient today.  WOC will try with placement a little different today to attempt to prevent leakage towards wound. Patient has trach valve on today so she is able to communicate with me.  Enrolled patient in [place] Start Discharge program: Yes  WOC wound follow up Wound type: surgical with stay sutures/plastic rods Wound bed:2 openings along surgical incision that are clean, pink, and moist Colostomy takedown site in the LLQ has some exposed subcutaneous tissue that the husband was concerned about but I have explained the appearance and the difference between the tissues.  Drainage (amount, consistency, odor) moderate, serosanguinous  Periwound: many of the ABRA insertions sites have some tissue necrosis with 100% yellow slough noted, she additionally has some tissue necrosis at the edge of one of the stay rods that is 1/2 yellow and 1/2 brown, soft. Will need to monitor sites closely Dressing procedure/placement/frequency: Sites left unpacked per bedside nurses request, awaiting surgical team to assess wounds.     WOC will follow along with you for continued support with ostomy teaching and care [facility] RN,CWOCN [id number]

## 2016-04-26 LAB — CBC WITH DIFFERENTIAL/PLATELET
BASOS ABS: 0 10*3/uL (ref 0.0–0.1)
Basophils Relative: 0 %
Eosinophils Absolute: 0.2 10*3/uL (ref 0.0–0.7)
Eosinophils Relative: 3 %
HCT: 27.9 % — ABNORMAL LOW (ref 36.0–46.0)
Hemoglobin: 8.3 g/dL — ABNORMAL LOW (ref 12.0–15.0)
LYMPHS ABS: 2.6 10*3/uL (ref 0.7–4.0)
LYMPHS PCT: 35 %
MCH: 27.5 pg (ref 26.0–34.0)
MCHC: 29.7 g/dL — ABNORMAL LOW (ref 30.0–36.0)
MCV: 92.4 fL (ref 78.0–100.0)
MONOS PCT: 8 %
Monocytes Absolute: 0.6 10*3/uL (ref 0.1–1.0)
NEUTROS ABS: 4.1 10*3/uL (ref 1.7–7.7)
NEUTROS PCT: 54 %
PLATELETS: 363 10*3/uL (ref 150–400)
RBC: 3.02 MIL/uL — AB (ref 3.87–5.11)
RDW: 17.4 % — ABNORMAL HIGH (ref 11.5–15.5)
WBC: 7.6 10*3/uL (ref 4.0–10.5)

## 2016-04-26 LAB — GLUCOSE, CAPILLARY
GLUCOSE-CAPILLARY: 91 mg/dL (ref 65–99)
GLUCOSE-CAPILLARY: 91 mg/dL (ref 65–99)
Glucose-Capillary: 106 mg/dL — ABNORMAL HIGH (ref 65–99)
Glucose-Capillary: 107 mg/dL — ABNORMAL HIGH (ref 65–99)
Glucose-Capillary: 111 mg/dL — ABNORMAL HIGH (ref 65–99)
Glucose-Capillary: 114 mg/dL — ABNORMAL HIGH (ref 65–99)

## 2016-04-26 LAB — RENAL FUNCTION PANEL
Albumin: 2 g/dL — ABNORMAL LOW (ref 3.5–5.0)
Anion gap: 9 (ref 5–15)
BUN: 25 mg/dL — ABNORMAL HIGH (ref 6–20)
CHLORIDE: 96 mmol/L — AB (ref 101–111)
CO2: 29 mmol/L (ref 22–32)
Calcium: 8.1 mg/dL — ABNORMAL LOW (ref 8.9–10.3)
Creatinine, Ser: 0.83 mg/dL (ref 0.44–1.00)
Glucose, Bld: 105 mg/dL — ABNORMAL HIGH (ref 65–99)
POTASSIUM: 3.7 mmol/L (ref 3.5–5.1)
Phosphorus: 3.4 mg/dL (ref 2.5–4.6)
Sodium: 134 mmol/L — ABNORMAL LOW (ref 135–145)

## 2016-04-26 MED ORDER — FENTANYL CITRATE (PF) 100 MCG/2ML IJ SOLN
25.0000 ug | INTRAMUSCULAR | Status: DC | PRN
  Administered 2016-04-26 – 2016-04-30 (×8): 50 ug via INTRAVENOUS
  Filled 2016-04-26 (×9): qty 2

## 2016-04-26 MED ORDER — OXYCODONE HCL 5 MG/5ML PO SOLN
10.0000 mg | ORAL | Status: DC | PRN
Start: 2016-04-26 — End: 2016-04-27
  Administered 2016-04-26 – 2016-04-27 (×3): 15 mg via ORAL
  Filled 2016-04-26 (×3): qty 15

## 2016-04-26 NOTE — Progress Notes (Signed)
Speech Language Pathology Treatment: Dysphagia;Passy Muir Speaking valve  Patient Details Name: Christina Castaneda MRN: [id number] DOB: 13-Feb-1956 Today's Date: 04/26/2016 Time: [id number] SLP Time Calculation (min) (ACUTE ONLY): 16 min  Assessment / Plan / Recommendation Clinical Impression  Pt had PMSV in place upon SLP arrival with improved voicing and intelligibility of speech. She reports improved tolerance since change to cuffless trach and has been wearing it throughout the day and night. Provided education about the recommendation to remove for sleep, and she and her husband voiced their understanding.   Pt consumed several bites of her lunch tray which did include some advanced textures. She has no signs of difficulty with solid textures and expresses her desire for better food. Note that her intake overall has been low. Recommend advancing to unrestricted textures to allow for more choices with food selection. Also wonder if tube feedings could be limiting pt's appetite.  She did have one instance of coughing after a straw sip of thin liquid, but cough is strong and she and her husband both deny coughing during PO intake. Will continue to monitor for tolerance.   HPI HPI: 60 yo female had diverticulitis with sigmoid colon resection, appendectomy at [facility] [place] on 3/20 and required colostomy.Came home to [place] , [place] Noted to have feculent material from midline wound and present to ER on 4/5. S/p laparotomy, colostomy removal and end stapling of stoma, partial colectomy, omentectomy, debridement of wound--Dr. Hulen Skains, subsequent ruq stoma and washout, subsequent abra placement by Dr Redmond Pulling. Pt was intubated 4/6-4/13, then trach.       SLP Plan  Continue with current plan of care     Recommendations  Diet recommendations: Regular;Thin liquid Liquids provided via: Cup;Straw Medication Administration: Whole meds with puree Supervision: Staff to assist with self feeding;Full supervision/cueing for  compensatory strategies Compensations: Slow rate;Small sips/bites Postural Changes and/or Swallow Maneuvers: Seated upright 90 degrees;Upright 30-60 min after meal      Patient may use Passy-Muir Speech Valve: During all waking hours (remove during sleep);During PO intake/meals PMSV Supervision: Intermittent      Oral Care Recommendations: Oral care BID Follow up Recommendations: Inpatient Rehab Plan: Continue with current plan of care     GO               Germain Osgood, M.A. CCC-SLP [phone]  Germain Osgood 04/26/2016, 1:53 PM

## 2016-04-26 NOTE — Consult Note (Signed)
WOC in to assess pouch due to close proximity to the retention sutures.  1pc flat intact. Patient reports staff have been emptying on regular basis and easier to empty for staff with pouch to the side.  Will have my [place] partner check in on patient tomorrow since we have only had success with 2 days wear time.  No other questions today from patient, she will move to stepdown unit today.  WOC will follow along with you for continued support with ostomy teaching and care [facility] RN,CWOCN [id number]

## 2016-04-26 NOTE — Progress Notes (Signed)
Patient ID: Christina Castaneda, female   DOB: Jul 15, 1956, 60 y.o.   MRN: [id number]     [address] [place], [place] [address]    Phone: [phone] FAX: [phone]     Subjective: No oob today. Tearful when i told her were going to dc foley. Needs to mobilize more. Vss. Afebrile.   Objective:  Vital signs:  Filed Vitals:   04/26/16 0900 04/26/16 1000 04/26/16 1115 04/26/16 1120  BP: 118/49 98/54 130/47   Pulse: 61 66 55 57  Temp:   98.4 F (36.9 C)   TempSrc:   Oral   Resp: '17 18 15 15  ' Height:      Weight:      SpO2: 99% 99% 100% 99%    Last BM Date: 04/26/16  Intake/Output   Yesterday:  05/03 0701 - 05/04 0700 In: 0932 [I.V.:460; NG/GT:1125] Out: 3300 [Urine:2850; Stool:450] This shift:  Total I/O In: 570 [I.V.:60; NG/GT:510] Out: 1050 [Urine:900; Stool:150]   Physical Exam: General: Pt awake/alert/oriented x4 in no acute distress Chest: on trach collar and PMV CV: Pulses intact. Regular rhythm Abdomen: Soft. Nondistended.midline wound with dry dressing. sttoma is pink and viable. No evidence of peritonitis. No incarcerated hernias. Ext: SCDs BLE. Generalized edema. No cyanosis Skin: No petechiae / purpura   Problem List:   Active Problems:   Infection of colostomy stoma (HCC)   Colocutaneous fistula   Ventilator dependence ([place]) post exp lap with open abd   Hypothyroid   Morbid obesity ([place])   Open wound of abdomen   Pressure ulcer   Acute respiratory failure (HCC)   Tracheostomy care ([place])   Chronic obstructive pulmonary disease (HCC)   Tobacco abuse   Essential hypertension   Bilateral knee pain   Dysphagia   Acute blood loss anemia   Hyponatremia   Thyroid activity decreased    Results:   Labs: Results for orders placed or performed during the hospital encounter of 03/28/16 (from the past 48 hour(s))  Glucose, capillary     Status: None   Collection Time:  04/24/16  4:18 PM  Result Value Ref Range   Glucose-Capillary 92 65 - 99 mg/dL   Comment 1 Notify RN   Glucose, capillary     Status: None   Collection Time: 04/24/16  8:57 PM  Result Value Ref Range   Glucose-Capillary 87 65 - 99 mg/dL  Glucose, capillary     Status: None   Collection Time: 04/24/16 11:51 PM  Result Value Ref Range   Glucose-Capillary 89 65 - 99 mg/dL   Comment 1 Notify RN   CBC with Differential/Platelet     Status: Abnormal   Collection Time: 04/25/16  2:40 AM  Result Value Ref Range   WBC 7.6 4.0 - 10.5 K/uL   RBC 3.09 (L) 3.87 - 5.11 MIL/uL   Hemoglobin 8.6 (L) 12.0 - 15.0 g/dL   HCT 28.3 (L) 36.0 - 46.0 %   MCV 91.6 78.0 - 100.0 fL   MCH 27.8 26.0 - 34.0 pg   MCHC 30.4 30.0 - 36.0 g/dL   RDW 17.3 (H) 11.5 - 15.5 %   Platelets 398 150 - 400 K/uL   Neutrophils Relative % 61 %   Neutro Abs 4.6 1.7 - 7.7 K/uL   Lymphocytes Relative 28 %   Lymphs Abs 2.1 0.7 - 4.0 K/uL   Monocytes Relative 8 %   Monocytes Absolute 0.6 0.1 -  1.0 K/uL   Eosinophils Relative 3 %   Eosinophils Absolute 0.3 0.0 - 0.7 K/uL   Basophils Relative 0 %   Basophils Absolute 0.0 0.0 - 0.1 K/uL  Renal function panel     Status: Abnormal   Collection Time: 04/25/16  2:48 AM  Result Value Ref Range   Sodium 134 (L) 135 - 145 mmol/L   Potassium 3.7 3.5 - 5.1 mmol/L   Chloride 95 (L) 101 - 111 mmol/L   CO2 30 22 - 32 mmol/L   Glucose, Bld 109 (H) 65 - 99 mg/dL   BUN 25 (H) 6 - 20 mg/dL   Creatinine, Ser 0.93 0.44 - 1.00 mg/dL   Calcium 7.9 (L) 8.9 - 10.3 mg/dL   Phosphorus 3.2 2.5 - 4.6 mg/dL   Albumin 2.0 (L) 3.5 - 5.0 g/dL   GFR calc non Af Amer >60 >60 mL/min   GFR calc Af Amer >60 >60 mL/min    Comment: (NOTE) The eGFR has been calculated using the CKD EPI equation. This calculation has not been validated in all clinical situations. eGFR's persistently <60 mL/min signify possible Chronic Kidney Disease.    Anion gap 9 5 - 15  Glucose, capillary     Status: Abnormal    Collection Time: 04/25/16  4:27 AM  Result Value Ref Range   Glucose-Capillary 104 (H) 65 - 99 mg/dL   Comment 1 Notify RN   Glucose, capillary     Status: Abnormal   Collection Time: 04/25/16  8:30 AM  Result Value Ref Range   Glucose-Capillary 102 (H) 65 - 99 mg/dL   Comment 1 Notify RN   BMET in AM     Status: Abnormal   Collection Time: 04/25/16  9:43 AM  Result Value Ref Range   Sodium 134 (L) 135 - 145 mmol/L   Potassium 4.7 3.5 - 5.1 mmol/L   Chloride 96 (L) 101 - 111 mmol/L   CO2 29 22 - 32 mmol/L   Glucose, Bld 106 (H) 65 - 99 mg/dL   BUN 25 (H) 6 - 20 mg/dL   Creatinine, Ser 0.90 0.44 - 1.00 mg/dL   Calcium 8.3 (L) 8.9 - 10.3 mg/dL   GFR calc non Af Amer >60 >60 mL/min   GFR calc Af Amer >60 >60 mL/min    Comment: (NOTE) The eGFR has been calculated using the CKD EPI equation. This calculation has not been validated in all clinical situations. eGFR's persistently <60 mL/min signify possible Chronic Kidney Disease.    Anion gap 9 5 - 15  Glucose, capillary     Status: Abnormal   Collection Time: 04/25/16 12:07 PM  Result Value Ref Range   Glucose-Capillary 103 (H) 65 - 99 mg/dL   Comment 1 Notify RN   Glucose, capillary     Status: None   Collection Time: 04/25/16  3:59 PM  Result Value Ref Range   Glucose-Capillary 98 65 - 99 mg/dL   Comment 1 Notify RN   Glucose, capillary     Status: None   Collection Time: 04/25/16  8:15 PM  Result Value Ref Range   Glucose-Capillary 93 65 - 99 mg/dL   Comment 1 Capillary Specimen   Glucose, capillary     Status: None   Collection Time: 04/26/16 12:29 AM  Result Value Ref Range   Glucose-Capillary 91 65 - 99 mg/dL   Comment 1 Notify RN    Comment 2 Document in Chart   Glucose, capillary  Status: None   Collection Time: 04/26/16  3:59 AM  Result Value Ref Range   Glucose-Capillary 91 65 - 99 mg/dL   Comment 1 Notify RN    Comment 2 Document in Chart   Renal function panel     Status: Abnormal   Collection Time:  04/26/16  4:39 AM  Result Value Ref Range   Sodium 134 (L) 135 - 145 mmol/L   Potassium 3.7 3.5 - 5.1 mmol/L    Comment: DELTA CHECK NOTED   Chloride 96 (L) 101 - 111 mmol/L   CO2 29 22 - 32 mmol/L   Glucose, Bld 105 (H) 65 - 99 mg/dL   BUN 25 (H) 6 - 20 mg/dL   Creatinine, Ser 0.83 0.44 - 1.00 mg/dL   Calcium 8.1 (L) 8.9 - 10.3 mg/dL   Phosphorus 3.4 2.5 - 4.6 mg/dL   Albumin 2.0 (L) 3.5 - 5.0 g/dL   GFR calc non Af Amer >60 >60 mL/min   GFR calc Af Amer >60 >60 mL/min    Comment: (NOTE) The eGFR has been calculated using the CKD EPI equation. This calculation has not been validated in all clinical situations. eGFR's persistently <60 mL/min signify possible Chronic Kidney Disease.    Anion gap 9 5 - 15  CBC with Differential/Platelet     Status: Abnormal   Collection Time: 04/26/16  4:39 AM  Result Value Ref Range   WBC 7.6 4.0 - 10.5 K/uL   RBC 3.02 (L) 3.87 - 5.11 MIL/uL   Hemoglobin 8.3 (L) 12.0 - 15.0 g/dL   HCT 27.9 (L) 36.0 - 46.0 %   MCV 92.4 78.0 - 100.0 fL   MCH 27.5 26.0 - 34.0 pg   MCHC 29.7 (L) 30.0 - 36.0 g/dL   RDW 17.4 (H) 11.5 - 15.5 %   Platelets 363 150 - 400 K/uL   Neutrophils Relative % 54 %   Neutro Abs 4.1 1.7 - 7.7 K/uL   Lymphocytes Relative 35 %   Lymphs Abs 2.6 0.7 - 4.0 K/uL   Monocytes Relative 8 %   Monocytes Absolute 0.6 0.1 - 1.0 K/uL   Eosinophils Relative 3 %   Eosinophils Absolute 0.2 0.0 - 0.7 K/uL   Basophils Relative 0 %   Basophils Absolute 0.0 0.0 - 0.1 K/uL  Glucose, capillary     Status: Abnormal   Collection Time: 04/26/16  8:02 AM  Result Value Ref Range   Glucose-Capillary 107 (H) 65 - 99 mg/dL   Comment 1 Capillary Specimen    Comment 2 Notify RN     Imaging / Studies: No results found.  Medications / Allergies:  Scheduled Meds: . sodium chloride   Intravenous Once  . antiseptic oral rinse  7 mL Mouth Rinse q12n4p  . arformoterol  15 mcg Nebulization BID  . budesonide (PULMICORT) nebulizer solution  0.5 mg  Nebulization BID  . chlorhexidine gluconate (SAGE KIT)  15 mL Mouth Rinse BID  . enoxaparin (LOVENOX) injection  40 mg Subcutaneous Q24H  . fentaNYL  100 mcg Transdermal Q72H  . furosemide  20 mg Intravenous Q12H  . levothyroxine  200 mcg Oral QAC breakfast  . pantoprazole sodium  40 mg Per Tube Daily  . sodium chloride flush  10-40 mL Intracatheter Q12H   Continuous Infusions: . sodium chloride 10 mL/hr at 04/26/16 1000  . feeding supplement (VITAL HIGH PROTEIN) 1,000 mL (04/26/16 1000)   PRN Meds:.acetaminophen (TYLENOL) oral liquid 160 mg/5 mL, albuterol, fentaNYL (SUBLIMAZE) injection, iopamidol, [DISCONTINUED]  ondansetron **OR** ondansetron (ZOFRAN) IV, oxyCODONE, sodium chloride flush  Antibiotics: Anti-infectives    Start     Dose/Rate Route Frequency Ordered Stop   04/18/16 1000  ciprofloxacin (CIPRO) IVPB 400 mg  Status:  Discontinued     400 mg 200 mL/hr over 60 Minutes Intravenous To Short Stay 04/17/16 1026 04/18/16 1340   04/03/16 2100  vancomycin (VANCOCIN) 1,500 mg in sodium chloride 0.9 % 500 mL IVPB  Status:  Discontinued     1,500 mg 250 mL/hr over 120 Minutes Intravenous Every 12 hours 04/02/16 2020 04/06/16 0943   04/02/16 2030  vancomycin (VANCOCIN) 2,500 mg in sodium chloride 0.9 % 500 mL IVPB     2,500 mg 250 mL/hr over 120 Minutes Intravenous  Once 04/02/16 2020 04/02/16 2306   03/29/16 1200  ciprofloxacin (CIPRO) IVPB 400 mg  Status:  Discontinued     400 mg 200 mL/hr over 60 Minutes Intravenous Every 12 hours 03/29/16 0213 04/06/16 0943   03/29/16 0600  [MAR Hold]  ciprofloxacin (CIPRO) IVPB 400 mg     (MAR Hold since 03/28/16 2357)   400 mg 200 mL/hr over 60 Minutes Intravenous On call to O.R. 03/28/16 2306 03/28/16 2355   03/29/16 0600  [MAR Hold]  metroNIDAZOLE (FLAGYL) IVPB 500 mg     (MAR Hold since 03/28/16 2357)   500 mg 100 mL/hr over 60 Minutes Intravenous On call to O.R. 03/28/16 2306 03/29/16 0025   03/29/16 0600  metroNIDAZOLE (FLAGYL) IVPB  500 mg  Status:  Discontinued     500 mg 100 mL/hr over 60 Minutes Intravenous Every 8 hours 03/29/16 0213 04/06/16 0943   03/28/16 2300  ertapenem (INVANZ) 1 g in sodium chloride 0.9 % 50 mL IVPB  Status:  Discontinued     1 g 100 mL/hr over 30 Minutes Intravenous  Once 03/28/16 2259 03/28/16 2306        Assessment/Plan: S/p Hartmann's in [place], [place] for perforated diverticulitis  Necrotic stoma with peritonitis and fascial necrosis  S/p laparotomy, colostomy removal and end stapling of stoma, partial colectomy, omentectomy, debridement of wound--Dr. [place](03/29/16) Left colectomy and end colostomy--Dr. Donne Hazel 03/30/16 Re-exploration of open abdomen and application of wound vac--Dr. Redmond Pulling 04/02/16 Placement of ABRA abdominal wall closure device(04/05/16)  Removal of ABRA device and closure of abdominal wall(04/18/16) -continue TF via cortrek, SLP eval and treat, encourage PO intake.  -PT/OT recommending inpatient rehab, following, but needs to participate more.  SNF as back up -continue BID wet to dry dressing changes to previous ostomy site and midline wound. -continue with retention sutures -DC foley -increase oxy IR, decrease iv fentanyl.  Septic shock-resolved AKI-resolved Anemia of critical illness-stable Hypothyroidism-home meds Acute respiratory failure-s/p trach, down sizing per CCM, appreciate assistance VTE prophylaxis-SCD/lovenox PCM-off TPN, tolerating TF at goal Dispo-CIR when available.  Wean IV pain meds. If unable to participate, then SNF     Erby Pian, [facility] Pager [phone]) For consults and floor pages call [phone](7A-4:30P)  04/26/2016 2:48 PM

## 2016-04-26 NOTE — Progress Notes (Signed)
Report called to RN on 3S; pt and husband made aware of transfer to 3S04; will cont. To monitor.  Christina Castaneda

## 2016-04-26 NOTE — Care Management Note (Signed)
Case Management Note  Patient Details  Name: Christina Castaneda MRN: [id number] Date of Birth: 04/18/1956  Subjective/Objective:     S/p Procedure:completion left colectomy with mobilization splenic flexure, end colostomy, placement of abdominal vac sponge               Action/Plan:  PTA - pt is independent from home alone with husband, recent surgery 3/20 Hartman's with colostomy at [facility].  Pt may benefit from HH/DME at discharge; CM will continue to monitor for disposition needs   Expected Discharge Date:                  Expected Discharge Plan:  [place]  In-House Referral:     Discharge planning Services  CM Consult  Post Acute Care Choice:    Choice offered to:     DME Arranged:    DME Agency:     HH Arranged:    [place] Agency:     Status of Service:  In process, will continue to follow  Medicare Important Message Given:    Date Medicare IM Given:    Medicare IM give by:    Date Additional Medicare IM Given:    Additional Medicare Important Message give by:     If discussed at [place] of Stay Meetings, dates discussed:    Additional Comments: 04/23/16  CIR recommendation placed, CSW consulted for back up plan  04/19/16 CM spoke with husband in great detail regarding discharge planning.  Husband is very positive regarding pts recovery.  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  Pt remains vented with an open wound.  CM will continue to follow for disposition need Maryclare Labrador, RN 04/26/2016, 11:01 AM

## 2016-04-26 NOTE — Progress Notes (Signed)
Rehab admissions - I met with husband and patient.  They would like inpatient rehab admission once medically ready.  Will need to see patient more participatory with therapy and able to tolerate up in chair for an hour at a time.  I will follow progress for now.  Call me for questions.  #[phone]

## 2016-04-27 DIAGNOSIS — Z930 Tracheostomy status: Secondary | ICD-10-CM | POA: Insufficient documentation

## 2016-04-27 LAB — CBC WITH DIFFERENTIAL/PLATELET
BASOS PCT: 0 %
Basophils Absolute: 0 10*3/uL (ref 0.0–0.1)
Eosinophils Absolute: 0.2 10*3/uL (ref 0.0–0.7)
Eosinophils Relative: 3 %
HEMATOCRIT: 26.5 % — AB (ref 36.0–46.0)
Hemoglobin: 8 g/dL — ABNORMAL LOW (ref 12.0–15.0)
LYMPHS ABS: 2.6 10*3/uL (ref 0.7–4.0)
Lymphocytes Relative: 33 %
MCH: 28.1 pg (ref 26.0–34.0)
MCHC: 30.2 g/dL (ref 30.0–36.0)
MCV: 93 fL (ref 78.0–100.0)
MONO ABS: 0.7 10*3/uL (ref 0.1–1.0)
MONOS PCT: 9 %
NEUTROS ABS: 4.3 10*3/uL (ref 1.7–7.7)
NEUTROS PCT: 55 %
Platelets: 347 10*3/uL (ref 150–400)
RBC: 2.85 MIL/uL — ABNORMAL LOW (ref 3.87–5.11)
RDW: 17.5 % — AB (ref 11.5–15.5)
WBC: 7.6 10*3/uL (ref 4.0–10.5)

## 2016-04-27 LAB — RENAL FUNCTION PANEL
ANION GAP: 12 (ref 5–15)
Albumin: 2.1 g/dL — ABNORMAL LOW (ref 3.5–5.0)
BUN: 30 mg/dL — ABNORMAL HIGH (ref 6–20)
CALCIUM: 8.4 mg/dL — AB (ref 8.9–10.3)
CHLORIDE: 94 mmol/L — AB (ref 101–111)
CO2: 30 mmol/L (ref 22–32)
CREATININE: 0.79 mg/dL (ref 0.44–1.00)
GFR calc Af Amer: >60 mL/min (ref >60)
GFR calc non Af Amer: >60 mL/min (ref >60)
GLUCOSE: 93 mg/dL (ref 65–99)
Phosphorus: 3.6 mg/dL (ref 2.5–4.6)
Potassium: 3.4 mmol/L — ABNORMAL LOW (ref 3.5–5.1)
SODIUM: 136 mmol/L (ref 135–145)

## 2016-04-27 LAB — GLUCOSE, CAPILLARY
Glucose-Capillary: 95 mg/dL (ref 65–99)
Glucose-Capillary: 97 mg/dL (ref 65–99)
Glucose-Capillary: 103 mg/dL — ABNORMAL HIGH (ref 65–99)
Glucose-Capillary: 93 mg/dL (ref 65–99)
Glucose-Capillary: 109 mg/dL — ABNORMAL HIGH (ref 65–99)

## 2016-04-27 MED ORDER — VITAL HIGH PROTEIN PO LIQD
1000.0000 mL | ORAL | Status: DC
  Administered 2016-04-27: 1000 mL
  Filled 2016-04-27 (×5): qty 1000

## 2016-04-27 MED ORDER — OXYCODONE HCL 5 MG/5ML PO SOLN
15.0000 mg | ORAL | Status: DC | PRN
  Administered 2016-04-27 – 2016-04-28 (×5): 20 mg via ORAL
  Filled 2016-04-27 (×5): qty 20

## 2016-04-27 MED ORDER — POTASSIUM CHLORIDE CRYS ER 20 MEQ PO TBCR
20.0000 meq | EXTENDED_RELEASE_TABLET | Freq: Once | ORAL | Status: AC
  Administered 2016-04-27: 20 meq via ORAL
  Filled 2016-04-27: qty 1

## 2016-04-27 NOTE — Progress Notes (Signed)
Physical Therapy Treatment Patient Details Name: Christina Castaneda MRN: [id number] DOB: 12-Jul-1956 Today's Date: 04/27/2016    History of Present Illness Pt adm with Necrotic stoma with peritonitis and fascial necrosis after recent colostomy in [place]. Underwent S/p laparotomy, colostomy removal and end stapling of stoma, partial colectomy, omentectomy, debridement of wound--Dr. Hulen Skains, subsequent RUQ stoma and washout, subsequent ABRA placement. Underwent Removal of ABRA device, closure of abdominal wall with retention sutures on 04/18/16. PMH -HTN, gout, obesity, COPD    PT Comments    Pt admitted with above diagnosis. Pt currently with functional limitations due to balance and endurance deficits. Pt able to stand with Clarise Cruz plus to get to chair today.  Pt stood close to a minute in [place] plus.  Pt and husband very excited about pts progress.  Pt making improvements each visit.  Pt will benefit from skilled PT to increase their independence and safety with mobility to allow discharge to the venue listed below.    Follow Up Recommendations  CIR     Equipment Recommendations  None recommended by PT    Recommendations for Other Services       Precautions / Restrictions Precautions Precautions: Fall Precaution Comments: Abdominal binder Restrictions Weight Bearing Restrictions: No    Mobility  Bed Mobility Overal bed mobility: +2 for physical assistance;Needs Assistance Bed Mobility: Rolling;Sidelying to Sit;Sit to Sidelying Rolling: Min assist;+2 for physical assistance Sidelying to sit: Mod assist;+2 for physical assistance       General bed mobility comments: Assist for legs and to elevate trunk. Assist to lower trunk and to bring legs back up into bed. Pt able to assist with UEs and LEs with repositioning up in bed with bed inverted.  Transfers Overall transfer level: Needs assistance Equipment used: Ambulation equipment used Clarise Cruz plus) Transfers: Sit to/from [employer] to Stand: Mod assist;+2 physical assistance;From elevated surface (with Clarise Cruz lift) Stand pivot transfers: Mod assist;+2 physical assistance       General transfer comment: Pt was able to stand with Clarise Cruz plus with assist with pad to come to standing.  Once in standing, pt stood 45 seconds while pt moved toward recliner (as footplate was left on for transfer).  Pt needed assist to control descent into chair.   Ambulation/Gait             General Gait Details: Not ready yet   Stairs            Wheelchair Mobility    Modified Rankin (Stroke Patients Only)       Balance Overall balance assessment: Needs assistance Sitting-balance support: Bilateral upper extremity supported;Feet supported Sitting balance-Leahy Scale: Poor Sitting balance - Comments: Sits EOB up to 8 min with UE support at times with min guard assist.  Postural control: Posterior lean Standing balance support: Bilateral upper extremity supported;During functional activity Standing balance-Leahy Scale: Poor Standing balance comment: relies on UE support and Clarise Cruz plus for standing as well as +2 assist.                     Cognition Arousal/Alertness: Awake/alert Behavior During Therapy: WFL for tasks assessed/performed;Anxious Overall Cognitive Status: Within Functional Limits for tasks assessed                      Exercises General Exercises - Lower Extremity Ankle Circles/Pumps: AROM;Both;10 reps;Supine;Seated Long Arc Quad: AROM;Both;10 reps;Seated Hip Flexion/Marching: AROM;Both;Seated;10 reps    General Comments General comments (skin integrity, edema, etc.):  Bil LES with edema.  Placed binder on abdomen prior to mobilizing.        Pertinent Vitals/Pain Pain Assessment: Faces Faces Pain Scale: Hurts whole lot Pain Location: abdomen Pain Descriptors / Indicators: Aching;Grimacing;Guarding Pain Intervention(s): Limited activity within patient's  tolerance;Monitored during session;Repositioned;RN gave pain meds during session  VSS    Home Living                      Prior Function            PT Goals (current goals can now be found in the care plan section) Progress towards PT goals: Progressing toward goals    Frequency  Min 3X/week    PT Plan Current plan remains appropriate    Co-evaluation             End of Session Equipment Utilized During Treatment: Gait belt;Oxygen (Trach collar) Activity Tolerance: Patient limited by fatigue Patient left: in chair;with call bell/phone within reach;with chair alarm set;with family/visitor present     Time: [id number] PT Time Calculation (min) (ACUTE ONLY): 38 min  Charges:  $Gait Training: 8-22 mins $Therapeutic Activity: 23-37 mins                    G CodesDenice Paradise 05-14-2016, 2:57 PM [facility] [facility] [phone] (pager)

## 2016-04-27 NOTE — Progress Notes (Signed)
   04/27/16 1500  PT Visit Information  Last PT Received On 04/27/16  Assistance Needed +2  History of Present Illness Pt adm with Necrotic stoma with peritonitis and fascial necrosis after recent colostomy in [place]. Underwent S/p laparotomy, colostomy removal and end stapling of stoma, partial colectomy, omentectomy, debridement of wound--Dr. Hulen Skains, subsequent RUQ stoma and washout, subsequent ABRA placement. Underwent Removal of ABRA device, closure of abdominal wall with retention sutures on 04/18/16. PMH -HTN, gout, obesity, COPD  Subjective Data  Subjective "I need to get back to bed.  Its been 2 hours."  Precautions  Precautions Fall  Precaution Comments Abdominal binder  Restrictions  Weight Bearing Restrictions No  Pain Assessment  Pain Assessment Faces  Faces Pain Scale 6  Pain Location abdomen  Pain Descriptors / Indicators Aching;Grimacing;Guarding  Pain Intervention(s) Limited activity within patient's tolerance;Monitored during session;Repositioned  Cognition  Arousal/Alertness Awake/alert  Behavior During Therapy WFL for tasks assessed/performed;Anxious  Overall Cognitive Status Within Functional Limits for tasks assessed  Bed Mobility  Overal bed mobility Needs Assistance;+2 for physical assistance  Bed Mobility Sit to Supine  Rolling Min assist;Mod assist  Sit to supine Total assist  General bed mobility comments Used maxi move to get pt back to bed as nursing called PT to assist with getting pt back to bed.  Once pt back in bed, pt needed bed pan therefore assisted nursing in rolling pt to remove pad and place bedpan.   Transfers  Overall transfer level Needs assistance  Transfer via [place] transfer comment Used maximove to assist pt back to bed.  Nursing called PT for assist with lift equipment.    PT - End of Session  Equipment Utilized During Treatment (maximove)  Activity Tolerance Patient limited by fatigue  Patient left in bed;with  call bell/phone within reach;with bed alarm set;with nursing/sitter in room  Nurse Communication Mobility status;Need for lift equipment  PT - Assessment/Plan  PT Plan Current plan remains appropriate  PT Frequency (ACUTE ONLY) Min 3X/week  Follow Up Recommendations CIR  PT equipment None recommended by PT  PT Goal Progression  Progress towards PT goals Progressing toward goals  PT Time Calculation  PT Start Time (ACUTE ONLY) 1335  PT Stop Time (ACUTE ONLY) 1349  PT Time Calculation (min) (ACUTE ONLY) 14 min  PT General Charges  $$ ACUTE PT VISIT 1 Procedure  PT Treatments  $Self Care/Home Management 8-22  [facility] [phone] (pager)

## 2016-04-27 NOTE — Consult Note (Addendum)
WOC wound follow up Wound type: Consult requested by surgical team for topical treatment to decrease pressure under the retention sutures surrounding the abd wound, where full thickness skin loss is beginning to occur.  There are 8 places which are tightly adhered to the skin with sutures and it is difficult to reduce pressure under the site or place a dressing underneath the bars until they are removed.  Pt states areas are painful to touch.  Tucked foam dressings underneath locations to assist with providing some padding and to absorb drainage.  Ostomy pouch is intact with a good seal, mod amt sem-formed brown stool in the pouch.  Supplies at bedside for staff nurse use if pouch change is required. [place] team will continue to follow for teaching sessions next week. Julien Girt MSN, RN, [place], [place], [place]

## 2016-04-27 NOTE — Progress Notes (Signed)
Patient requesting to go back to bed.  Lift pad malpositioned under patient.  PT had gotten patient out of bed into chair.  I called PT to request PT come back and place patient back in the bed.

## 2016-04-27 NOTE — Progress Notes (Addendum)
PULMONARY / CRITICAL CARE MEDICINE   Name: Christina Castaneda MRN: [id number] DOB: 1956/12/11    ADMISSION DATE:  03/28/2016 CONSULTATION DATE:  03/29/2016  REFERRING MD:  CCS  CHIEF COMPLAINT:  Colostomy Leaking  BRIEF: 60 y/o female had diverticulitis with sigmoid colon resection, appendectomy at [place], [place] on 3/20 and required colostomy.  Came home to [place], [place] and noted to have feculent material from midline wound and present to ER on 4/5. S/p laparotomy, colostomy removal and end stapling of stoma, partial colectomy, omentectomy, debridement of wound--Dr. Hulen Skains, subsequent ruq stoma and washout, subsequent abra placement by Dr Redmond Pulling.   SUBJECTIVE:  Spoke with RN pt tolerating 6 shiley cuffless trach.  Eating and drinking fluids no s/s of respiratory distress present.    VITAL SIGNS: BP 137/57 mmHg  Pulse 63  Temp(Src) 98.1 F (36.7 C) (Oral)  Resp 16  Ht 5\' 6"  (1.676 m)  Wt 307 lb (139.254 kg)  BMI 49.57 kg/m2  SpO2 96%  LMP 11/18/2012  HEMODYNAMICS:    VENTILATOR SETTINGS: Vent Mode:  [-]  FiO2 (%):  [28 %] 28 %  INTAKE / OUTPUT: I/O last 3 completed shifts: In: 2635 [P.O.:240; I.V.:460; NG/GT:1935] Out: [id number] [Urine:4000; Stool:465]  PHYSICAL EXAMINATION: General: Alert and interactive, obese Neuro: AAOx4, speech clear, MAE / generalized weakness  HEENT: trach site clean, mm pink/moist Cardiac: RRR, no mgr Chest: even/non-labored, lungs diminished througout Abd: faint bowel sounds, abdominal dressing in place, colostomy  Ext: trace edema Skin: no rash   LABS:  PULMONARY No results for input(s): PHART, PCO2ART, PO2ART, HCO3, TCO2, O2SAT in the last 168 hours.  Invalid input(s): PCO2, PO2 CBC  Recent Labs Lab 04/25/16 0240 04/26/16 0439 04/27/16 0345  HGB 8.6* 8.3* 8.0*  HCT 28.3* 27.9* 26.5*  WBC 7.6 7.6 7.6  PLT 398 363 347   COAGULATION No results for input(s): INR in the last 168 hours.  CARDIAC  No results for input(s): TROPONINI in the last  168 hours. No results for input(s): PROBNP in the last 168 hours.  CHEMISTRY  Recent Labs Lab 04/23/16 0509 04/24/16 0311 04/25/16 0248 04/25/16 0943 04/26/16 0439 04/27/16 0345  NA 131*  131* 133* 134* 134* 134* 136  K 4.4  4.3 3.9 3.7 4.7 3.7 3.4*  CL 94*  94* 97* 95* 96* 96* 94*  CO2 30  29 27 30 29 29 30   GLUCOSE 104*  108* 82 109* 106* 105* 93  BUN 24*  24* 23* 25* 25* 25* 30*  CREATININE 0.84  0.81 0.89 0.93 0.90 0.83 0.79  CALCIUM 8.1*  8.2* 8.1* 7.9* 8.3* 8.1* 8.4*  MG 2.2  --   --   --   --   --   PHOS 3.9  4.0 3.7 3.2  --  3.4 3.6   Estimated Creatinine Clearance: 105.1 mL/min (by C-G formula based on Cr of 0.79).  LIVER  Recent Labs Lab 04/23/16 0509 04/24/16 0311 04/25/16 0248 04/26/16 0439 04/27/16 0345  AST 47*  --   --   --   --   ALT 37  --   --   --   --   ALKPHOS 86  --   --   --   --   BILITOT 0.4  --   --   --   --   PROT 6.3*  --   --   --   --   ALBUMIN 1.9*  1.9* 2.0* 2.0* 2.0* 2.1*   INFECTIOUS No results for input(s):  LATICACIDVEN, PROCALCITON in the last 168 hours.  ENDOCRINE CBG (last 3)   Recent Labs  04/26/16 2331 04/27/16 0325 04/27/16 0817  GLUCAP 114* 95 97   IMAGING x48h No results found.     MICROBIOLOGY: Blood Ctx x1 4/9 >> Coag neg staph Urine Ctx 4/9 >> Coag neg staph Tracheal Asp Ctx 4/9 >> yeast Blood Ctx x2 4/18 >> negative Urine Ctx 4/5 >>  Multiple species present MRSA PCR 4/6 >>  Negative   ANTIBIOTICS: Cipro 4/5 >> 4/14 Flagyl 4/5 >> 4/14 Vanco 4/11 >> 4/14  LINES/TUBES: ETT 4/6>>> 4/13 R PICC 4/6 >> L RADIAL ART LINE 4/7>>>4/15 #6 cuffed Trach [employer]) 4/13 >>5/2 #6 cuffless trach 5/2>> Rt femoral aline 4/15 >> out   SIGNIFICANT EVENTS: 3/20  Exp lap appendectomy,removal sigmoid colon, bowel repair in setting diverticulitis. [facility] 4/06  Exp Lap , colostomy removal, partial colectomy,omentectom, wound debridement, open wound with wound vac. 4/10  Start TPN 4/21  Lightened sedation  with open abdomen. Remains critically ill intubated, remains on low dose levo gtt lower doses versed/ fent gtt  4/26  fascia closure & removal of abra device 4/27  ATC daytime > continued 4/29 4/30  On ATC since 4/28 am, tolerating PMV some   ASSESSMENT / PLAN:  PULMONARY A: Acute resp failure, prolonged ventialtion s/p tracheostomy - tolerating ATC greater than 24 hrs with FiO2 at 28% Hx of COPD with asthma, OSA -  wheezing noted 4/14 Tobacco abuse P:   Continue trach collar Titrate O2 for sat of 88-92% Pulmonary hygiene Trach care Brovana, pulmicort with prn albuterol  CARDIOVASCULAR A:  Septic shock - resolved Hx of HTN Echo - nml LV fn, nml RV P:  Cont to monitor  RENAL A:   AKI - resolved Hyponatremia P:  Monitor BMET and UOP Replace electrolytes as indicated  GASTROINTESTINAL A:   Peritonitis s/p laparotomy Protein calorie malnutrition  Morbid obesity P:   Post-op care, nutrition per CCS Protonix for SUP Small bore feeding tube placed for TF  HEMATOLOGIC A:   Anemia of critical illness, acute blood loss 4/27  P:  Transfuse for Hb < 7 Lovenox for DVT prevention Limit phlebotomy as able   INFECTIOUS A:   Septic shock 2nd to peritonitis and cellulitis of ostomy site - resolved Coag neg Staph bacteremia, UTI - treated with vancomycin  x 7ds, Rpt blood cx shows clearance  Hx of PCN allergy P:   Monitor off of antibiotics Trend fever curve / wbc  ENDOCRINE A:   Hx of hypothyroidism P:   Continue PO synthroid  NEUROLOGIC A:   Acute metabolic encephalopathy - resolved  Pain Deconditioning P:   fent prn + fentanyl patch  Push PT efforts, mobilize  Marda Stalker, [place]

## 2016-04-27 NOTE — Progress Notes (Signed)
Misty, PT, to room and assisted in getting patient back to bed.

## 2016-04-27 NOTE — Progress Notes (Signed)
PT contacted second time to ask for help getting patient back to bed.  PT employee stated someone will come help.

## 2016-04-27 NOTE — Progress Notes (Signed)
Patient ID: Christina Castaneda, female   DOB: [birth year], 60 y.o.   MRN: [id number]     [address] [place], [place] [address]    Phone: [phone] FAX: [phone]     Subjective: k 3.4.  On lasix 51m bid. -6L. Eating about 15-20% of meals.  ngt is bothering her, thinks she can eat more if it was removed. Afebrile vss. Sore. Wants to get up with therapies.   Objective:  Vital signs:  Filed Vitals:   04/26/16 2329 04/27/16 0320 04/27/16 0409 04/27/16 0816  BP: 125/69 138/69  137/57  Pulse: 62 51 63   Temp: 97.8 F (36.6 C) 98 F (36.7 C)  98.1 F (36.7 C)  TempSrc: Oral Oral  Oral  Resp: '16 14 16   ' Height:  '5\' 6"'  (1.676 m)    Weight:  139.254 kg (307 lb)    SpO2: 94% 97% 96%     Last BM Date: 04/26/16  Intake/Output   Yesterday:  05/04 0701 - 05/05 0700 In: 13557[P.O.:240; I.V.:220; NG/GT:1110] Out: 2865 [Urine:2700; Stool:165] This shift:  Total I/O In: -  Out: 175 [Urine:175]   Physical Exam: General: Pt awake/alert/oriented x4 in no acute distress Chest: on trach collar and PMV CV: Pulses intact. Regular rhythm Abdomen: Soft. Nondistended.  Midline wound with retention sutures in place, some breakdown is noted, fascia is intact, wound is clean.  llq ostomy site, fibrinous tissue at 6 oclock otherwise clean, repacked.  sttoma is pink and viable. No evidence of peritonitis. No incarcerated hernias. Ext: SCDs BLE. Generalized edema. No cyanosis Skin: No petechiae / purpura  Problem List:   Active Problems:   Infection of colostomy stoma (HCC)   Colocutaneous fistula   Ventilator dependence ([place] post exp lap with open abd   Hypothyroid   Morbid obesity ([facility]   Open wound of abdomen   Pressure ulcer   Acute respiratory failure (HCC)   Tracheostomy care ([facility]   Chronic obstructive pulmonary disease ([facility]   Tobacco abuse   Essential hypertension   Bilateral knee pain  Dysphagia   Acute blood loss anemia   Hyponatremia   Thyroid activity decreased    Results:   Labs: Results for orders placed or performed during the hospital encounter of 03/28/16 (from the past 48 hour(s))  Glucose, capillary     Status: Abnormal   Collection Time: 04/25/16 12:07 PM  Result Value Ref Range   Glucose-Capillary 103 (H) 65 - 99 mg/dL   Comment 1 Notify RN   Glucose, capillary     Status: None   Collection Time: 04/25/16  3:59 PM  Result Value Ref Range   Glucose-Capillary 98 65 - 99 mg/dL   Comment 1 Notify RN   Glucose, capillary     Status: None   Collection Time: 04/25/16  8:15 PM  Result Value Ref Range   Glucose-Capillary 93 65 - 99 mg/dL   Comment 1 Capillary Specimen   Glucose, capillary     Status: None   Collection Time: 04/26/16 12:29 AM  Result Value Ref Range   Glucose-Capillary 91 65 - 99 mg/dL   Comment 1 Notify RN    Comment 2 Document in Chart   Glucose, capillary     Status: None   Collection Time: 04/26/16  3:59 AM  Result Value Ref Range   Glucose-Capillary 91 65 - 99 mg/dL   Comment 1 Notify RN  Comment 2 Document in Chart   Renal function panel     Status: Abnormal   Collection Time: 04/26/16  4:39 AM  Result Value Ref Range   Sodium 134 (L) 135 - 145 mmol/L   Potassium 3.7 3.5 - 5.1 mmol/L    Comment: DELTA CHECK NOTED   Chloride 96 (L) 101 - 111 mmol/L   CO2 29 22 - 32 mmol/L   Glucose, Bld 105 (H) 65 - 99 mg/dL   BUN 25 (H) 6 - 20 mg/dL   Creatinine, Ser 0.83 0.44 - 1.00 mg/dL   Calcium 8.1 (L) 8.9 - 10.3 mg/dL   Phosphorus 3.4 2.5 - 4.6 mg/dL   Albumin 2.0 (L) 3.5 - 5.0 g/dL   GFR calc non Af Amer >60 >60 mL/min   GFR calc Af Amer >60 >60 mL/min    Comment: (NOTE) The eGFR has been calculated using the CKD EPI equation. This calculation has not been validated in all clinical situations. eGFR's persistently <60 mL/min signify possible Chronic Kidney Disease.    Anion gap 9 5 - 15  CBC with Differential/Platelet      Status: Abnormal   Collection Time: 04/26/16  4:39 AM  Result Value Ref Range   WBC 7.6 4.0 - 10.5 K/uL   RBC 3.02 (L) 3.87 - 5.11 MIL/uL   Hemoglobin 8.3 (L) 12.0 - 15.0 g/dL   HCT 27.9 (L) 36.0 - 46.0 %   MCV 92.4 78.0 - 100.0 fL   MCH 27.5 26.0 - 34.0 pg   MCHC 29.7 (L) 30.0 - 36.0 g/dL   RDW 17.4 (H) 11.5 - 15.5 %   Platelets 363 150 - 400 K/uL   Neutrophils Relative % 54 %   Neutro Abs 4.1 1.7 - 7.7 K/uL   Lymphocytes Relative 35 %   Lymphs Abs 2.6 0.7 - 4.0 K/uL   Monocytes Relative 8 %   Monocytes Absolute 0.6 0.1 - 1.0 K/uL   Eosinophils Relative 3 %   Eosinophils Absolute 0.2 0.0 - 0.7 K/uL   Basophils Relative 0 %   Basophils Absolute 0.0 0.0 - 0.1 K/uL  Glucose, capillary     Status: Abnormal   Collection Time: 04/26/16  8:02 AM  Result Value Ref Range   Glucose-Capillary 107 (H) 65 - 99 mg/dL   Comment 1 Capillary Specimen    Comment 2 Notify RN   Glucose, capillary     Status: Abnormal   Collection Time: 04/26/16  2:51 PM  Result Value Ref Range   Glucose-Capillary 111 (H) 65 - 99 mg/dL   Comment 1 Notify RN    Comment 2 Document in Chart   Glucose, capillary     Status: Abnormal   Collection Time: 04/26/16  7:56 PM  Result Value Ref Range   Glucose-Capillary 106 (H) 65 - 99 mg/dL  Glucose, capillary     Status: Abnormal   Collection Time: 04/26/16 11:31 PM  Result Value Ref Range   Glucose-Capillary 114 (H) 65 - 99 mg/dL  Glucose, capillary     Status: None   Collection Time: 04/27/16  3:25 AM  Result Value Ref Range   Glucose-Capillary 95 65 - 99 mg/dL  Renal function panel     Status: Abnormal   Collection Time: 04/27/16  3:45 AM  Result Value Ref Range   Sodium 136 135 - 145 mmol/L   Potassium 3.4 (L) 3.5 - 5.1 mmol/L   Chloride 94 (L) 101 - 111 mmol/L   CO2 30 22 -  32 mmol/L   Glucose, Bld 93 65 - 99 mg/dL   BUN 30 (H) 6 - 20 mg/dL   Creatinine, Ser 0.79 0.44 - 1.00 mg/dL   Calcium 8.4 (L) 8.9 - 10.3 mg/dL   Phosphorus 3.6 2.5 - 4.6 mg/dL    Albumin 2.1 (L) 3.5 - 5.0 g/dL   GFR calc non Af Amer >60 >60 mL/min   GFR calc Af Amer >60 >60 mL/min    Comment: (NOTE) The eGFR has been calculated using the CKD EPI equation. This calculation has not been validated in all clinical situations. eGFR's persistently <60 mL/min signify possible Chronic Kidney Disease.    Anion gap 12 5 - 15  CBC with Differential/Platelet     Status: Abnormal   Collection Time: 04/27/16  3:45 AM  Result Value Ref Range   WBC 7.6 4.0 - 10.5 K/uL   RBC 2.85 (L) 3.87 - 5.11 MIL/uL   Hemoglobin 8.0 (L) 12.0 - 15.0 g/dL   HCT 26.5 (L) 36.0 - 46.0 %   MCV 93.0 78.0 - 100.0 fL   MCH 28.1 26.0 - 34.0 pg   MCHC 30.2 30.0 - 36.0 g/dL   RDW 17.5 (H) 11.5 - 15.5 %   Platelets 347 150 - 400 K/uL   Neutrophils Relative % 55 %   Neutro Abs 4.3 1.7 - 7.7 K/uL   Lymphocytes Relative 33 %   Lymphs Abs 2.6 0.7 - 4.0 K/uL   Monocytes Relative 9 %   Monocytes Absolute 0.7 0.1 - 1.0 K/uL   Eosinophils Relative 3 %   Eosinophils Absolute 0.2 0.0 - 0.7 K/uL   Basophils Relative 0 %   Basophils Absolute 0.0 0.0 - 0.1 K/uL  Glucose, capillary     Status: None   Collection Time: 04/27/16  8:17 AM  Result Value Ref Range   Glucose-Capillary 97 65 - 99 mg/dL    Imaging / Studies: No results found.  Medications / Allergies:  Scheduled Meds: . sodium chloride   Intravenous Once  . antiseptic oral rinse  7 mL Mouth Rinse q12n4p  . arformoterol  15 mcg Nebulization BID  . budesonide (PULMICORT) nebulizer solution  0.5 mg Nebulization BID  . chlorhexidine gluconate (SAGE KIT)  15 mL Mouth Rinse BID  . enoxaparin (LOVENOX) injection  40 mg Subcutaneous Q24H  . fentaNYL  100 mcg Transdermal Q72H  . levothyroxine  200 mcg Oral QAC breakfast  . pantoprazole sodium  40 mg Per Tube Daily  . sodium chloride flush  10-40 mL Intracatheter Q12H   Continuous Infusions: . sodium chloride 10 mL/hr at 04/26/16 1505  . feeding supplement (VITAL HIGH PROTEIN) 1,000 mL (04/26/16  1505)   PRN Meds:.acetaminophen (TYLENOL) oral liquid 160 mg/5 mL, albuterol, fentaNYL (SUBLIMAZE) injection, iopamidol, [DISCONTINUED] ondansetron **OR** ondansetron (ZOFRAN) IV, oxyCODONE, sodium chloride flush  Antibiotics: Anti-infectives    Start     Dose/Rate Route Frequency Ordered Stop   04/18/16 1000  ciprofloxacin (CIPRO) IVPB 400 mg  Status:  Discontinued     400 mg 200 mL/hr over 60 Minutes Intravenous To Short Stay 04/17/16 1026 04/18/16 1340   04/03/16 2100  vancomycin (VANCOCIN) 1,500 mg in sodium chloride 0.9 % 500 mL IVPB  Status:  Discontinued     1,500 mg 250 mL/hr over 120 Minutes Intravenous Every 12 hours 04/02/16 2020 04/06/16 0943   04/02/16 2030  vancomycin (VANCOCIN) 2,500 mg in sodium chloride 0.9 % 500 mL IVPB     2,500 mg 250 mL/hr over 120  Minutes Intravenous  Once 04/02/16 2020 04/02/16 2306   03/29/16 1200  ciprofloxacin (CIPRO) IVPB 400 mg  Status:  Discontinued     400 mg 200 mL/hr over 60 Minutes Intravenous Every 12 hours 03/29/16 0213 04/06/16 0943   03/29/16 0600  [MAR Hold]  ciprofloxacin (CIPRO) IVPB 400 mg     (MAR Hold since 03/28/16 2357)   400 mg 200 mL/hr over 60 Minutes Intravenous On call to O.R. 03/28/16 2306 03/28/16 2355   03/29/16 0600  [MAR Hold]  metroNIDAZOLE (FLAGYL) IVPB 500 mg     (MAR Hold since 03/28/16 2357)   500 mg 100 mL/hr over 60 Minutes Intravenous On call to O.R. 03/28/16 2306 03/29/16 0025   03/29/16 0600  metroNIDAZOLE (FLAGYL) IVPB 500 mg  Status:  Discontinued     500 mg 100 mL/hr over 60 Minutes Intravenous Every 8 hours 03/29/16 0213 04/06/16 0943   03/28/16 2300  ertapenem (INVANZ) 1 g in sodium chloride 0.9 % 50 mL IVPB  Status:  Discontinued     1 g 100 mL/hr over 30 Minutes Intravenous  Once 03/28/16 2259 03/28/16 2306        Assessment/Plan: S/p [facility] in [place], [place] for perforated diverticulitis  Necrotic stoma with peritonitis and fascial necrosis  S/p laparotomy, colostomy removal and end  stapling of stoma, partial colectomy, omentectomy, debridement of wound--Dr. Wyatt(03/29/16) Left colectomy and end colostomy--Dr. Donne Hazel 03/30/16 Re-exploration of open abdomen and application of wound vac--Dr. Redmond Pulling 04/02/16 Placement of ABRA abdominal wall closure device(04/05/16)  Removal of ABRA device and closure of abdominal wall(04/18/16) -PT/OT recommending inpatient rehab, following, but needs to participate more. SNF as back up -continue BID wet to dry dressing changes to previous ostomy site and midline wound.  -continue with retention sutures, ask woc to help with skin breakdown underneath the plastic sutures -increase oxy IR, decrease iv fentanyl.  Septic shock-resolved AKI-resolved Anemia of critical illness-stable Hypothyroidism-home meds Acute respiratory failure-s/p trach, down sizing per CCM, appreciate assistance VTE prophylaxis-SCD/lovenox Hypokalemia-supplement 29mq PO, stop lasix, -6L. No further diuresis needed.  PCM-tolerating TF at goal, pt thinks she could eat significantly more if cortrak was removed, will d/w Dr. TGeorgette DoverDispo-CIR when available. Wean IV pain meds. If unable to participate, then SNF   EErby Pian [facility] Pager [phone] For consults and floor pages call [phone](7A-4:30P)  04/27/2016 9:49 AM

## 2016-04-27 NOTE — Clinical Social Work Note (Signed)
CSW met with patient to present bed offers for SNF backup. Patient's husband at bedside. First preference is [facility] in [place] and second preference in [place] [facility]. Will continue to follow patient to determine if she is going to CIR or SNF.  Dayton Scrape, [place]

## 2016-04-28 LAB — CBC WITH DIFFERENTIAL/PLATELET
BASOS PCT: 0 %
Basophils Absolute: 0 10*3/uL (ref 0.0–0.1)
EOS ABS: 0.2 10*3/uL (ref 0.0–0.7)
Eosinophils Relative: 3 %
HCT: 26.6 % — ABNORMAL LOW (ref 36.0–46.0)
HEMOGLOBIN: 8.1 g/dL — AB (ref 12.0–15.0)
Lymphocytes Relative: 40 %
Lymphs Abs: 3 10*3/uL (ref 0.7–4.0)
MCH: 28.8 pg (ref 26.0–34.0)
MCHC: 30.5 g/dL (ref 30.0–36.0)
MCV: 94.7 fL (ref 78.0–100.0)
Monocytes Absolute: 0.6 10*3/uL (ref 0.1–1.0)
Monocytes Relative: 8 %
NEUTROS PCT: 49 %
Neutro Abs: 3.8 10*3/uL (ref 1.7–7.7)
PLATELETS: 332 10*3/uL (ref 150–400)
RBC: 2.81 MIL/uL — AB (ref 3.87–5.11)
RDW: 17.6 % — ABNORMAL HIGH (ref 11.5–15.5)
WBC: 7.6 10*3/uL (ref 4.0–10.5)

## 2016-04-28 LAB — GLUCOSE, CAPILLARY
GLUCOSE-CAPILLARY: 89 mg/dL (ref 65–99)
GLUCOSE-CAPILLARY: 77 mg/dL (ref 65–99)
GLUCOSE-CAPILLARY: 84 mg/dL (ref 65–99)
GLUCOSE-CAPILLARY: 112 mg/dL — AB (ref 65–99)
Glucose-Capillary: 86 mg/dL (ref 65–99)
Glucose-Capillary: 90 mg/dL (ref 65–99)

## 2016-04-28 LAB — RENAL FUNCTION PANEL
Albumin: 2 g/dL — ABNORMAL LOW (ref 3.5–5.0)
Anion gap: 9 (ref 5–15)
BUN: 27 mg/dL — ABNORMAL HIGH (ref 6–20)
CALCIUM: 8.4 mg/dL — AB (ref 8.9–10.3)
CO2: 30 mmol/L (ref 22–32)
CREATININE: 0.84 mg/dL (ref 0.44–1.00)
Chloride: 95 mmol/L — ABNORMAL LOW (ref 101–111)
Glucose, Bld: 93 mg/dL (ref 65–99)
Phosphorus: 3.8 mg/dL (ref 2.5–4.6)
Potassium: 3.6 mmol/L (ref 3.5–5.1)
SODIUM: 134 mmol/L — AB (ref 135–145)

## 2016-04-28 LAB — MRSA PCR SCREENING: MRSA by PCR: NEGATIVE

## 2016-04-28 MED ORDER — ACETAMINOPHEN 160 MG/5ML PO SOLN
650.0000 mg | Freq: Four times a day (QID) | ORAL | Status: DC | PRN
  Administered 2016-05-01 – 2016-05-02 (×2): 650 mg via ORAL
  Filled 2016-04-28 (×2): qty 20.3

## 2016-04-28 MED ORDER — CHLORHEXIDINE GLUCONATE 0.12 % MT SOLN
OROMUCOSAL | Status: AC
  Filled 2016-04-28: qty 15

## 2016-04-28 MED ORDER — PANTOPRAZOLE SODIUM 40 MG PO TBEC
40.0000 mg | DELAYED_RELEASE_TABLET | Freq: Every day | ORAL | Status: DC
  Administered 2016-04-28 – 2016-05-02 (×5): 40 mg via ORAL
  Filled 2016-04-28 (×5): qty 1

## 2016-04-28 MED ORDER — OXYCODONE HCL 5 MG PO TABS
10.0000 mg | ORAL_TABLET | ORAL | Status: DC | PRN
  Administered 2016-04-28: 10 mg via ORAL
  Administered 2016-04-28: 5 mg via ORAL
  Administered 2016-04-29 – 2016-04-30 (×6): 10 mg via ORAL
  Administered 2016-04-30: 15 mg via ORAL
  Administered 2016-04-30: 10 mg via ORAL
  Administered 2016-04-30: 15 mg via ORAL
  Administered 2016-05-01 – 2016-05-02 (×7): 10 mg via ORAL
  Administered 2016-05-02: 15 mg via ORAL
  Filled 2016-04-28 (×3): qty 2
  Filled 2016-04-28: qty 3
  Filled 2016-04-28 (×2): qty 2
  Filled 2016-04-28: qty 3
  Filled 2016-04-28 (×2): qty 2
  Filled 2016-04-28 (×3): qty 3
  Filled 2016-04-28 (×7): qty 2

## 2016-04-28 MED ORDER — OXYCODONE HCL 5 MG PO TABS
10.0000 mg | ORAL_TABLET | ORAL | Status: DC | PRN
  Administered 2016-04-28: 10 mg via ORAL
  Filled 2016-04-28: qty 2

## 2016-04-28 NOTE — Progress Notes (Signed)
Speech Language Pathology Treatment: Nada Boozer Speaking valve  Patient Details Name: TYGER LIFTON MRN: [id number] DOB: 04-06-1956 Today's Date: 04/28/2016 Time: 1000-1010 SLP Time Calculation (min) (ACUTE ONLY): 10 min  Assessment / Plan / Recommendation Clinical Impression  Pt tolerating PMV quite welll with SP02 at 100% and using valve all waking hours.  Vocal quality/intensity improving.  Reviewed placement/removal of valve with husband; instructed both about nightly cleaning of valve and allowing it to air-dry in purple container.  They verbalize understanding, I with cues.  Making good progress with communication/swallowing.    HPI HPI: 60 yo female had diverticulitis with sigmoid colon resection, appendectomy at [place], [place] on 3/20 and required colostomy.Came home to [place] , [place] Noted to have feculent material from midline wound and present to ER on 4/5. S/p laparotomy, colostomy removal and end stapling of stoma, partial colectomy, omentectomy, debridement of wound--Dr. Hulen Skains, subsequent ruq stoma and washout, subsequent abra placement by Dr Redmond Pulling. Pt was intubated 4/6-4/13, then trach.       SLP Plan  Continue with current plan of care     Recommendations         Patient may use Passy-Muir Speech Valve: During all waking hours (remove during sleep);During PO intake/meals PMSV Supervision: Intermittent      Oral Care Recommendations: Oral care BID Plan: Continue with current plan of care     GO                Juan Quam Laurice 04/28/2016, 10:20 AM

## 2016-04-28 NOTE — Progress Notes (Signed)
10 Days Post-Op  Subjective: Looks well trach with valve speaking clearly  Objective: Vital signs in last 24 hours: Temp:  [97.9 F (36.6 C)-98.3 F (36.8 C)] 97.9 F (36.6 C) (05/06 0353) Pulse Rate:  [51-69] 51 (05/06 0803) Resp:  [12-20] 16 (05/06 0803) BP: (117-140)/(50-79) 117/72 mmHg (05/06 0803) SpO2:  [94 %-100 %] 96 % (05/06 0803) FiO2 (%):  [28 %] 28 % (05/06 0803) Weight:  [139.708 kg (308 lb)] 139.708 kg (308 lb) (05/06 0228) Last BM Date: 04/27/16  Intake/Output from previous day: 05/05 0701 - 05/06 0700 In: 960 [P.O.:840; I.V.:120] Out: 1125 [Urine:825; Stool:300] Intake/Output this shift: Total I/O In: 20.5 [I.V.:20.5] Out: -   Resp: clear to auscultation bilaterally Cardio: regular rate and rhythm, S1, S2 normal, no murmur, click, rub or gallop Incision/Wound:sutures in place   Clean  Intact open with kerlex Ostomy functioning well   Lab Results:   Recent Labs  04/27/16 0345 04/28/16 0400  WBC 7.6 7.6  HGB 8.0* 8.1*  HCT 26.5* 26.6*  PLT 347 332   BMET  Recent Labs  04/27/16 0345 04/28/16 0408  NA 136 134*  K 3.4* 3.6  CL 94* 95*  CO2 30 30  GLUCOSE 93 93  BUN 30* 27*  CREATININE 0.79 0.84  CALCIUM 8.4* 8.4*   PT/INR No results for input(s): LABPROT, INR in the last 72 hours. ABG No results for input(s): PHART, HCO3 in the last 72 hours.  Invalid input(s): PCO2, PO2  Studies/Results: No results found.  Anti-infectives: Anti-infectives    Start     Dose/Rate Route Frequency Ordered Stop   04/18/16 1000  ciprofloxacin (CIPRO) IVPB 400 mg  Status:  Discontinued     400 mg 200 mL/hr over 60 Minutes Intravenous To Short Stay 04/17/16 1026 04/18/16 1340   04/03/16 2100  vancomycin (VANCOCIN) 1,500 mg in sodium chloride 0.9 % 500 mL IVPB  Status:  Discontinued     1,500 mg 250 mL/hr over 120 Minutes Intravenous Every 12 hours 04/02/16 2020 04/06/16 0943   04/02/16 2030  vancomycin (VANCOCIN) 2,500 mg in sodium chloride 0.9 % 500 mL  IVPB     2,500 mg 250 mL/hr over 120 Minutes Intravenous  Once 04/02/16 2020 04/02/16 2306   03/29/16 1200  ciprofloxacin (CIPRO) IVPB 400 mg  Status:  Discontinued     400 mg 200 mL/hr over 60 Minutes Intravenous Every 12 hours 03/29/16 0213 04/06/16 0943   03/29/16 0600  [MAR Hold]  ciprofloxacin (CIPRO) IVPB 400 mg     (MAR Hold since 03/28/16 2357)   400 mg 200 mL/hr over 60 Minutes Intravenous On call to O.R. 03/28/16 2306 03/28/16 2355   03/29/16 0600  [MAR Hold]  metroNIDAZOLE (FLAGYL) IVPB 500 mg     (MAR Hold since 03/28/16 2357)   500 mg 100 mL/hr over 60 Minutes Intravenous On call to O.R. 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 Wyatt(03/29/16) Left colectomy and end colostomy--Dr. Donne Hazel 03/30/16 Re-exploration of open abdomen and application of wound vac--Dr. Redmond Pulling 04/02/16 Placement of ABRA abdominal wall closure device(04/05/16)  Removal of ABRA device and closure of abdominal wall(04/18/16) -PT/OT recommending inpatient rehab, following, but needs to participate more. SNF as back up -continue BID wet to dry dressing changes to previous ostomy site and midline wound.  -continue with retention sutures, ask woc to help with skin breakdown underneath the plastic sutures -increase oxy IR, decrease iv fentanyl.  Septic  shock-resolved AKI-resolved Anemia of critical illness-stable Hypothyroidism-home meds Acute respiratory failure-s/p trach, down sizing per CCM, appreciate assistance VTE prophylaxis-SCD/lovenox Hypokalemia-supplement 56mEq PO, stop lasix, -6L. No further diuresis needed.  PCM-tolerating TF at goal, pt thinks she could eat significantly more if cortrak was removed, will d/w Dr. Georgette Dover Dispo-CIR when available. Wean IV pain meds. If unable to participate, then SNF  Not ready yet for the floor since she requires significant asisatnce This is improving  Encourage po intake   LOS: 31 days    Mckoy Bhakta A. 04/28/2016

## 2016-04-28 NOTE — Progress Notes (Signed)
Pt's cortrak tube removed per order.  No N/V reported by pt.  Pt has been educated to increase foods/fluids intake.  Will continue to monitor.

## 2016-04-29 LAB — CBC WITH DIFFERENTIAL/PLATELET
Basophils Absolute: 0 10*3/uL (ref 0.0–0.1)
Basophils Relative: 0 %
Eosinophils Absolute: 0.3 10*3/uL (ref 0.0–0.7)
Eosinophils Relative: 4 %
HCT: 27.2 % — ABNORMAL LOW (ref 36.0–46.0)
HEMOGLOBIN: 8 g/dL — AB (ref 12.0–15.0)
LYMPHS ABS: 2.6 10*3/uL (ref 0.7–4.0)
Lymphocytes Relative: 41 %
MCH: 27.7 pg (ref 26.0–34.0)
MCHC: 29.4 g/dL — ABNORMAL LOW (ref 30.0–36.0)
MCV: 94.1 fL (ref 78.0–100.0)
Monocytes Absolute: 0.5 10*3/uL (ref 0.1–1.0)
Monocytes Relative: 8 %
NEUTROS PCT: 47 %
Neutro Abs: 3 10*3/uL (ref 1.7–7.7)
Platelets: 328 10*3/uL (ref 150–400)
RBC: 2.89 MIL/uL — AB (ref 3.87–5.11)
RDW: 17.9 % — ABNORMAL HIGH (ref 11.5–15.5)
WBC: 6.4 10*3/uL (ref 4.0–10.5)

## 2016-04-29 LAB — RENAL FUNCTION PANEL
ALBUMIN: 2 g/dL — AB (ref 3.5–5.0)
ANION GAP: 10 (ref 5–15)
BUN: 17 mg/dL (ref 6–20)
CALCIUM: 8.6 mg/dL — AB (ref 8.9–10.3)
CO2: 30 mmol/L (ref 22–32)
CREATININE: 0.82 mg/dL (ref 0.44–1.00)
Chloride: 97 mmol/L — ABNORMAL LOW (ref 101–111)
GFR calc Af Amer: >60 mL/min (ref >60)
GFR calc non Af Amer: >60 mL/min (ref >60)
Glucose, Bld: 85 mg/dL (ref 65–99)
PHOSPHORUS: 3.9 mg/dL (ref 2.5–4.6)
Potassium: 3.9 mmol/L (ref 3.5–5.1)
SODIUM: 137 mmol/L (ref 135–145)

## 2016-04-29 LAB — GLUCOSE, CAPILLARY
GLUCOSE-CAPILLARY: 78 mg/dL (ref 65–99)
GLUCOSE-CAPILLARY: 81 mg/dL (ref 65–99)
GLUCOSE-CAPILLARY: 87 mg/dL (ref 65–99)
GLUCOSE-CAPILLARY: 92 mg/dL (ref 65–99)
Glucose-Capillary: 88 mg/dL (ref 65–99)
Glucose-Capillary: 92 mg/dL (ref 65–99)

## 2016-04-29 NOTE — Progress Notes (Signed)
11 Days Post-Op  Subjective: PT doing well speaking with valve   Objective: Vital signs in last 24 hours: Temp:  [97.7 F (36.5 C)-98.5 F (36.9 C)] 97.9 F (36.6 C) (05/07 0804) Pulse Rate:  [53-83] 54 (05/07 0804) Resp:  [11-18] 14 (05/07 0804) BP: (109-127)/(46-67) 120/67 mmHg (05/07 0804) SpO2:  [96 %-100 %] 100 % (05/07 0851) FiO2 (%):  [28 %] 28 % (05/07 0851) Last BM Date: 04/28/16  Intake/Output from previous day: 05/06 0701 - 05/07 0700 In: 610 [P.O.:360; I.V.:250] Out: 850 [Urine:850] Intake/Output this shift: Total I/O In: 10 [I.V.:10] Out: 175 [Urine:175]  Incision/Wound:sutures in place  Some maceration  Midline clean and packed  Granulation tissue noted   Lab Results:   Recent Labs  04/28/16 0400 04/29/16 0516  WBC 7.6 6.4  HGB 8.1* 8.0*  HCT 26.6* 27.2*  PLT 332 328   BMET  Recent Labs  04/28/16 0408 04/29/16 0516  NA 134* 137  K 3.6 3.9  CL 95* 97*  CO2 30 30  GLUCOSE 93 85  BUN 27* 17  CREATININE 0.84 0.82  CALCIUM 8.4* 8.6*   PT/INR No results for input(s): LABPROT, INR in the last 72 hours. ABG No results for input(s): PHART, HCO3 in the last 72 hours.  Invalid input(s): PCO2, PO2  Studies/Results: No results found.  Anti-infectives: Anti-infectives    Start     Dose/Rate Route Frequency Ordered Stop   04/18/16 1000  ciprofloxacin (CIPRO) IVPB 400 mg  Status:  Discontinued     400 mg 200 mL/hr over 60 Minutes Intravenous To Short Stay 04/17/16 1026 04/18/16 1340   04/03/16 2100  vancomycin (VANCOCIN) 1,500 mg in sodium chloride 0.9 % 500 mL IVPB  Status:  Discontinued     1,500 mg 250 mL/hr over 120 Minutes Intravenous Every 12 hours 04/02/16 2020 04/06/16 0943   04/02/16 2030  vancomycin (VANCOCIN) 2,500 mg in sodium chloride 0.9 % 500 mL IVPB     2,500 mg 250 mL/hr over 120 Minutes Intravenous  Once 04/02/16 2020 04/02/16 2306   03/29/16 1200  ciprofloxacin (CIPRO) IVPB 400 mg  Status:  Discontinued     400 mg 200  mL/hr over 60 Minutes Intravenous Every 12 hours 03/29/16 0213 04/06/16 0943   03/29/16 0600  [MAR Hold]  ciprofloxacin (CIPRO) IVPB 400 mg     (MAR Hold since 03/28/16 2357)   400 mg 200 mL/hr over 60 Minutes Intravenous On call to O.R. 03/28/16 2306 03/28/16 2355   03/29/16 0600  [MAR Hold]  metroNIDAZOLE (FLAGYL) IVPB 500 mg     (MAR Hold since 03/28/16 2357)   500 mg 100 mL/hr over 60 Minutes Intravenous On call to O.R. 03/28/16 2306 03/29/16 0025   03/29/16 0600  metroNIDAZOLE (FLAGYL) IVPB 500 mg  Status:  Discontinued     500 mg 100 mL/hr over 60 Minutes Intravenous Every 8 hours 03/29/16 0213 04/06/16 0943   03/28/16 2300  ertapenem (INVANZ) 1 g in sodium chloride 0.9 % 50 mL IVPB  Status:  Discontinued     1 g 100 mL/hr over 30 Minutes Intravenous  Once 03/28/16 2259 03/28/16 2306      Assessment/Plan: s/p Procedure(s): EXPLORATORY LAPAROTOMY (N/A) CLOSURE OF ABDOMEN (N/A) Patient Active Problem List   Diagnosis Date Noted  . Tracheostomy status ([place])   . Tracheostomy care ([place])   . Chronic obstructive pulmonary disease ([place])   . Tobacco abuse   . Essential hypertension   . Bilateral knee pain   .  Dysphagia   . Acute blood loss anemia   . Hyponatremia   . Thyroid activity decreased   . Acute respiratory failure ([place])   . Pressure ulcer 04/01/2016  . 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 Hypothyroid 03/29/2016  . Morbid obesity ([place]) 03/29/2016  . Open wound of abdomen 03/29/2016  . Infection of colostomy stoma ([place]) 03/28/2016  . 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 Obesity 11/12/2012    DOING BETTER  NEEDS TO BE OOB CONTINUE TO ENCOURAGE PO INTAKE  WILL NEED CONTINUE THERAPIES   LOS: 32 days    Tameisha Covell A. 04/29/2016

## 2016-04-30 LAB — CBC WITH DIFFERENTIAL/PLATELET
BASOS PCT: 0 %
Basophils Absolute: 0 10*3/uL (ref 0.0–0.1)
EOS ABS: 0.2 10*3/uL (ref 0.0–0.7)
EOS PCT: 3 %
HCT: 26.4 % — ABNORMAL LOW (ref 36.0–46.0)
Hemoglobin: 7.8 g/dL — ABNORMAL LOW (ref 12.0–15.0)
Lymphocytes Relative: 43 %
Lymphs Abs: 2.9 10*3/uL (ref 0.7–4.0)
MCH: 27.9 pg (ref 26.0–34.0)
MCHC: 29.5 g/dL — AB (ref 30.0–36.0)
MCV: 94.3 fL (ref 78.0–100.0)
MONO ABS: 0.5 10*3/uL (ref 0.1–1.0)
MONOS PCT: 7 %
Neutro Abs: 3.1 10*3/uL (ref 1.7–7.7)
Neutrophils Relative %: 47 %
PLATELETS: 290 10*3/uL (ref 150–400)
RBC: 2.8 MIL/uL — ABNORMAL LOW (ref 3.87–5.11)
RDW: 17.7 % — AB (ref 11.5–15.5)
WBC: 6.7 10*3/uL (ref 4.0–10.5)

## 2016-04-30 LAB — RENAL FUNCTION PANEL
ALBUMIN: 2.1 g/dL — AB (ref 3.5–5.0)
Anion gap: 8 (ref 5–15)
BUN: 12 mg/dL (ref 6–20)
CALCIUM: 8.2 mg/dL — AB (ref 8.9–10.3)
CO2: 29 mmol/L (ref 22–32)
CREATININE: 0.83 mg/dL (ref 0.44–1.00)
Chloride: 98 mmol/L — ABNORMAL LOW (ref 101–111)
GFR calc Af Amer: >60 mL/min (ref >60)
GFR calc non Af Amer: >60 mL/min (ref >60)
GLUCOSE: 85 mg/dL (ref 65–99)
Phosphorus: 3.7 mg/dL (ref 2.5–4.6)
Potassium: 3.7 mmol/L (ref 3.5–5.1)
SODIUM: 135 mmol/L (ref 135–145)

## 2016-04-30 LAB — GLUCOSE, CAPILLARY
GLUCOSE-CAPILLARY: 96 mg/dL (ref 65–99)
GLUCOSE-CAPILLARY: 86 mg/dL (ref 65–99)
Glucose-Capillary: 82 mg/dL (ref 65–99)
Glucose-Capillary: 103 mg/dL — ABNORMAL HIGH (ref 65–99)

## 2016-04-30 NOTE — Progress Notes (Signed)
12 Days Post-Op  Subjective: Doing well.  NAE overnight.  Resting comfortably in bed.  Objective: Vital signs in last 24 hours: Temp:  [98.1 F (36.7 C)-98.7 F (37.1 C)] 98.2 F (36.8 C) (05/08 0829) Pulse Rate:  [48-70] 49 (05/08 0755) Resp:  [11-18] 11 (05/08 0755) BP: (110-142)/(50-68) 131/59 mmHg (05/08 0755) SpO2:  [96 %-100 %] 100 % (05/08 0755) FiO2 (%):  [28 %] 28 % (05/08 0755) Weight:  [137 kg (302 lb 0.5 oz)] 137 kg (302 lb 0.5 oz) (05/08 0500) Last BM Date: 04/28/16  Intake/Output from previous day: 05/07 0701 - 05/08 0700 In: 550 [P.O.:360; I.V.:190] Out: 825 [Urine:825] Intake/Output this shift: Total I/O In: 260 [P.O.:240; I.V.:20] Out: 100 [Urine:100]  General appearance: alert, cooperative, appears older than stated age and no distress GI: normal findings: soft, non-tender Incision/Wound:Ostomy in RUQ pink and viable.  Midline wound with good granulation tissues in base.  Macerated skin adjacent to midline wound at site of previous ABRA and retention sutures.  Former ostomy site with good granulation tissue visible.  Lab Results:   Recent Labs  04/29/16 0516 04/30/16 0400  WBC 6.4 6.7  HGB 8.0* 7.8*  HCT 27.2* 26.4*  PLT 328 290   BMET  Recent Labs  04/29/16 0516 04/30/16 0400  NA 137 135  K 3.9 3.7  CL 97* 98*  CO2 30 29  GLUCOSE 85 85  BUN 17 12  CREATININE 0.82 0.83  CALCIUM 8.6* 8.2*   PT/INR No results for input(s): LABPROT, INR in the last 72 hours. ABG No results for input(s): PHART, HCO3 in the last 72 hours.  Invalid input(s): PCO2, PO2  Studies/Results: No results found.  Anti-infectives: Anti-infectives    Start     Dose/Rate Route Frequency Ordered Stop   04/18/16 1000  ciprofloxacin (CIPRO) IVPB 400 mg  Status:  Discontinued     400 mg 200 mL/hr over 60 Minutes Intravenous To Short Stay 04/17/16 1026 04/18/16 1340   04/03/16 2100  vancomycin (VANCOCIN) 1,500 mg in sodium chloride 0.9 % 500 mL IVPB  Status:   Discontinued     1,500 mg 250 mL/hr over 120 Minutes Intravenous Every 12 hours 04/02/16 2020 04/06/16 0943   04/02/16 2030  vancomycin (VANCOCIN) 2,500 mg in sodium chloride 0.9 % 500 mL IVPB     2,500 mg 250 mL/hr over 120 Minutes Intravenous  Once 04/02/16 2020 04/02/16 2306   03/29/16 1200  ciprofloxacin (CIPRO) IVPB 400 mg  Status:  Discontinued     400 mg 200 mL/hr over 60 Minutes Intravenous Every 12 hours 03/29/16 0213 04/06/16 0943   03/29/16 0600  [MAR Hold]  ciprofloxacin (CIPRO) IVPB 400 mg     (MAR Hold since 03/28/16 2357)   400 mg 200 mL/hr over 60 Minutes Intravenous On call to O.R. 03/28/16 2306 03/28/16 2355   03/29/16 0600  [MAR Hold]  metroNIDAZOLE (FLAGYL) IVPB 500 mg     (MAR Hold since 03/28/16 2357)   500 mg 100 mL/hr over 60 Minutes Intravenous On call to O.R. 03/28/16 2306 03/29/16 0025   03/29/16 0600  metroNIDAZOLE (FLAGYL) IVPB 500 mg  Status:  Discontinued     500 mg 100 mL/hr over 60 Minutes Intravenous Every 8 hours 03/29/16 0213 04/06/16 0943   03/28/16 2300  ertapenem (INVANZ) 1 g in sodium chloride 0.9 % 50 mL IVPB  Status:  Discontinued     1 g 100 mL/hr over 30 Minutes Intravenous  Once 03/28/16 2259 03/28/16 2306  Assessment/Plan: s/p Procedure(s): EXPLORATORY LAPAROTOMY (N/A) CLOSURE OF ABDOMEN (N/A) Wound doing well.  Continue with current management.  Will remove a couple of sutures no longer serving a purpose.  Continue with therapy.    LOS: 33 days    Christina Castaneda 04/30/2016

## 2016-04-30 NOTE — Progress Notes (Signed)
Physical Therapy Treatment Patient Details Name: Christina Castaneda MRN: [id number] DOB: January 12, 1956 Today's Date: 04/30/2016    History of Present Illness Pt adm with Necrotic stoma with peritonitis and fascial necrosis after recent colostomy in [place]. Underwent S/p laparotomy, colostomy removal and end stapling of stoma, partial colectomy, omentectomy, debridement of wound--Dr. Hulen Skains, subsequent RUQ stoma and washout, subsequent ABRA placement. Underwent Removal of ABRA device, closure of abdominal wall with retention sutures on 04/18/16. PMH -HTN, gout, obesity, COPD    PT Comments    Patient progressing with bed mobility and feel would have been able to stand if had not fatigued with dressing changes prior to session as well as having to change out lift due to not working.  Patient remains encouraged and motivated despite issues with equipment today.  Remains good CIR candidate for continued interdisciplinary skilled therapy needs prior to d/c home.  Follow Up Recommendations  CIR     Equipment Recommendations  None recommended by PT    Recommendations for Other Services       Precautions / Restrictions Precautions Precautions: Fall Precaution Comments: Abdominal binder    Mobility  Bed Mobility Overal bed mobility: Needs Assistance;+2 for physical assistance   Rolling: Min assist Sidelying to sit: Mod assist   Sit to supine: Mod assist;+2 for physical assistance   General bed mobility comments: rolling in bed for using bedpan and hygiene as well as donning abdominal binder prior to assist to elevate trunk with +2 for safety  Transfers Overall transfer level: Needs assistance Equipment used: Ambulation equipment used Clarise Cruz Plus) Transfers: Sit to/from Stand Sit to Stand: +2 physical assistance;Mod assist         General transfer comment: assist to stand with Clarise Cruz Plus, but pt unable to come up all the way to standing due to fatigued after dressing changes and having to  switch out [employer] due to not working, but attempted x 3 with ability to lift hips from EOB x 3 attempts, but not to come all the way up.   Ambulation/Gait                 Stairs            Wheelchair Mobility    Modified Rankin (Stroke Patients Only)       Balance Overall balance assessment: Needs assistance Sitting-balance support: Feet supported;Single extremity supported Sitting balance-Leahy Scale: Poor Sitting balance - Comments: UE support for sitting EOB but no physical help needed as in [employer]  much of session                             Cognition Arousal/Alertness: Awake/alert Behavior During Therapy: [facility] for tasks assessed/performed Overall Cognitive Status: Within Functional Limits for tasks assessed                      Exercises      General Comments        Pertinent Vitals/Pain Faces Pain Scale: Hurts little more Pain Location: abdomen Pain Descriptors / Indicators: Grimacing;Guarding Pain Intervention(s): Repositioned;Monitored during session;Limited activity within patient's tolerance    Home Living                      Prior Function            PT Goals (current goals can now be found in the care plan section) Progress towards PT goals: Progressing toward  goals    Frequency  Min 3X/week    PT Plan Current plan remains appropriate    Co-evaluation             End of Session Equipment Utilized During Treatment: Other (comment) (abdominal binder, Sara Plus) Activity Tolerance: Patient limited by fatigue Patient left: in bed;with call bell/phone within reach     Time: 1115-1155 PT Time Calculation (min) (ACUTE ONLY): 40 min  Charges:  $Therapeutic Activity: 23-37 mins                    G CodesReginia Naas 2016-05-09, 4:08 PM  Magda Kiel, [place] 05-09-2016

## 2016-04-30 NOTE — Consult Note (Signed)
WOC wound follow up Wound type: device related pressure injuries and skin necrosis at [place] sites. Wound [id number] areas of skin necrosis largest on the right side of the abdomen between stoma and retention rod.  Midline wound dressing changed with CCS at bedside.  Good granulation tissue, 100% pink.  Per Dr. Smith Mince order 3 prolene sutures removed today Ostomy takedown site, clean, early granulation Drainage (amount, consistency, odor) yellow, moderate, not purulent Periwound: intact  Dressing procedure/placement/frequency: Moist saline gauze packed into midline wound and takedown site. Covered with dry ABD pads and secured with tape. Changed silicone foam under retention sutures and over old [place] sites.  All of these sites have 100% yellow slough.   WOC ostomy follow up Stoma type/location: RLQ, end colostomy Stomal assessment/size: 1 3/4" x 1 3/8" oval shaped, budded, pink, moist Peristomal assessment: intact  Treatment options for stomal/peristomal skin: using 2" barrier ring to aid in seal  Output brown, some formed stool Ostomy pouching: 1pc. With 2" barrier ring  Education provided: demonstrated pouch change and use of barrier ring to patient and her husband. Offered to let husband cut pouch today, he declined stated "I have done it before, you are doing a fine job". Enrolled patient in [place] Start Discharge program: Yes  Dr. Kathreen Cosier at the bedside to assess wound and ostomy.   WOC will follow along with you for continued support with ostomy teaching and care [facility] RN,CWOCN [id number]

## 2016-04-30 NOTE — Care Management Note (Signed)
Case Management Note  Patient Details  Name: JENE STBERNARD MRN: [id number] Date of Birth: 09-30-1956  Subjective/Objective:  NCM received call from [facility] , stating that inpatient rehab is the best place for patient, [facility] does not have any beds right now and [place] would like for NCM to search other inpatient rehab facility such as [facility] and [facility].  Patient states to contact  her husband, NCM spoke with husband and he states that they are interested in the [facility], NCM explained to him that we can still work on the [facility] but would it be ok to send her information out to the inpatient rehab facilities to see if they could take patient, he said yes.  [facility].                 Action/Plan:   Expected Discharge Date:                  Expected Discharge Plan:  [place]  In-House Referral:     Discharge planning Services  CM Consult  Post Acute Care Choice:    Choice offered to:     DME Arranged:    DME Agency:     HH Arranged:    HH Agency:     Status of Service:  In process, will continue to follow  Medicare Important Message Given:    Date Medicare IM Given:    Medicare IM give by:    Date Additional Medicare IM Given:    Additional Medicare Important Message give by:     If discussed at [place] of Stay Meetings, dates discussed:    Additional Comments:  Zenon Mayo, RN 04/30/2016, 3:21 PM

## 2016-04-30 NOTE — Clinical Social Work Note (Addendum)
CSW spoke with CIR admissions coordinator this morning and she stated that CIR would not have bed availability for about one week. Seeking SNF placement. CSW paged Dr. Rosendo Gros asking for estimated discharge date due to 73 hour window for insurance authorization. He said that if we needed 48 hours, he could wait to discharge until then.  Dayton Scrape, [place]  CSW called patient's husband and discussed SNF placement. He accepted offer at [facility] in [place]. Patient's husband said that the doctor talked about removing her trach at the end of the week. Discussed insurance authorization process. CSW then called [facility] and left a message with admissions to let them know that they had accepted the offer.  Dayton Scrape, [place]

## 2016-04-30 NOTE — Progress Notes (Signed)
Rehab admissions - I have no beds available on rehab today.  Recommend SNF for backup in case no rehab bed available when patient medically ready for discharge.  Call me for questions.  [phone]

## 2016-05-01 LAB — RENAL FUNCTION PANEL
ALBUMIN: 2.1 g/dL — AB (ref 3.5–5.0)
ANION GAP: 11 (ref 5–15)
BUN: 10 mg/dL (ref 6–20)
CALCIUM: 8.1 mg/dL — AB (ref 8.9–10.3)
CO2: 27 mmol/L (ref 22–32)
Chloride: 97 mmol/L — ABNORMAL LOW (ref 101–111)
Creatinine, Ser: 0.97 mg/dL (ref 0.44–1.00)
GFR calc Af Amer: >60 mL/min (ref >60)
Glucose, Bld: 88 mg/dL (ref 65–99)
PHOSPHORUS: 4 mg/dL (ref 2.5–4.6)
POTASSIUM: 3.9 mmol/L (ref 3.5–5.1)
Sodium: 135 mmol/L (ref 135–145)

## 2016-05-01 LAB — CBC WITH DIFFERENTIAL/PLATELET
BASOS ABS: 0 10*3/uL (ref 0.0–0.1)
BASOS PCT: 0 %
EOS PCT: 3 %
Eosinophils Absolute: 0.2 10*3/uL (ref 0.0–0.7)
HEMATOCRIT: 26.7 % — AB (ref 36.0–46.0)
Hemoglobin: 7.8 g/dL — ABNORMAL LOW (ref 12.0–15.0)
LYMPHS PCT: 41 %
Lymphs Abs: 2.9 10*3/uL (ref 0.7–4.0)
MCH: 27.5 pg (ref 26.0–34.0)
MCHC: 29.2 g/dL — ABNORMAL LOW (ref 30.0–36.0)
MCV: 94 fL (ref 78.0–100.0)
Monocytes Absolute: 0.5 10*3/uL (ref 0.1–1.0)
Monocytes Relative: 7 %
NEUTROS ABS: 3.4 10*3/uL (ref 1.7–7.7)
Neutrophils Relative %: 49 %
PLATELETS: 268 10*3/uL (ref 150–400)
RBC: 2.84 MIL/uL — AB (ref 3.87–5.11)
RDW: 17.6 % — ABNORMAL HIGH (ref 11.5–15.5)
WBC: 7 10*3/uL (ref 4.0–10.5)

## 2016-05-01 LAB — GLUCOSE, CAPILLARY
GLUCOSE-CAPILLARY: 90 mg/dL (ref 65–99)
GLUCOSE-CAPILLARY: 93 mg/dL (ref 65–99)
GLUCOSE-CAPILLARY: 97 mg/dL (ref 65–99)
Glucose-Capillary: 76 mg/dL (ref 65–99)

## 2016-05-01 MED ORDER — ALTEPLASE 2 MG IJ SOLR
2.0000 mg | Freq: Once | INTRAMUSCULAR | Status: AC
  Administered 2016-05-01: 2 mg

## 2016-05-01 MED ORDER — ADULT MULTIVITAMIN W/MINERALS CH
1.0000 | ORAL_TABLET | Freq: Every day | ORAL | Status: DC
  Administered 2016-05-02: 1 via ORAL
  Filled 2016-05-01: qty 1

## 2016-05-01 MED ORDER — ENSURE ENLIVE PO LIQD
237.0000 mL | Freq: Two times a day (BID) | ORAL | Status: DC
  Administered 2016-05-01 – 2016-05-02 (×2): 237 mL via ORAL

## 2016-05-01 NOTE — Progress Notes (Signed)
IV team RN declotted PICC lines, states blood return present, but PICC is positional and to raise pt's arm in order to get adequate blood return.

## 2016-05-01 NOTE — Progress Notes (Signed)
PULMONARY / CRITICAL CARE MEDICINE   Name: Christina Castaneda MRN: [id number] DOB: [birth year]    ADMISSION DATE:  03/28/2016 CONSULTATION DATE:  03/29/2016  REFERRING MD:  CCS  CHIEF COMPLAINT:  Colostomy bag leaking  BRIEF: 60 y/o female had diverticulitis with sigmoid colon resection, appendectomy at [place], [place] on 3/20 and required colostomy.  Came home to [place], [place] and noted to have feculent material from midline wound and present to ER on 4/5. S/p laparotomy, colostomy removal and end stapling of stoma, partial colectomy, omentectomy, debridement of wound--Dr. Hulen Skains, subsequent ruq stoma and washout, subsequent abra placement by Dr Redmond Pulling.  MICROBIOLOGY: Blood Ctx x1 4/9 >> Coag neg staph Urine Ctx 4/9 >> Coag neg staph Tracheal Asp Ctx 4/9 >> yeast Blood Ctx x2 4/18 >> negative Urine Ctx 4/5 >>  Multiple species present MRSA PCR 4/6 >>  Negative   ANTIBIOTICS: Cipro 4/5 >> 4/14 Flagyl 4/5 >> 4/14 Vanco 4/11 >> 4/14  LINES/TUBES: ETT 4/6>>> 4/13 R PICC 4/6 >> L RADIAL ART LINE 4/7>>>4/15 #6 cuffed Trach [employer]) 4/13 >> Rt femoral aline 4/15 >> out   SIGNIFICANT EVENTS: 3/20  Exp lap appendectomy,removal sigmoid colon, bowel repair in setting diverticulitis. [facility] 4/06  Exp Lap , colostomy removal, partial colectomy,omentectom, wound debridement, open wound with wound vac. 4/10  Start TPN 4/21  Lightened sedation  with open abdomen. Remains critically ill , intubated on  Low dose levo gtt.  lower doses versed/ fent gtt  4/26  fascia closure & removal of abra device 4/27  ATC daytime > continued 4/29 4/30  On ATC since 4/28 am, tolerating PMV some 5/09  ATC 28%, tolerating PMV   SUBJECTIVE:  Pt reports feeling good, tolerating PMV.  Worked with PT yesterday.  Still has not stood independently.     VITAL SIGNS: BP 129/70 mmHg  Pulse 56  Temp(Src) 98.9 F (37.2 C) (Oral)  Resp 17  Ht 5\' 6"  (1.676 m)  Wt 306 lb 7 oz (139 kg)  BMI 49.48 kg/m2  SpO2 98%  LMP  11/18/2012  HEMODYNAMICS:    VENTILATOR SETTINGS: Vent Mode:  [-]  FiO2 (%):  [28 %] 28 %  INTAKE / OUTPUT: I/O last 3 completed shifts: In: 745 [P.O.:480; I.V.:265] Out: 975 [Urine:975]  PHYSICAL EXAMINATION: General: Alert and interactive, obese Neuro: AAOx4, speech clear, MAE / generalized weakness  HEENT: #6 trach site clean, mm pink/moist Cardiac: RRR, no mgr Chest: even/non-labored, lungs bilaterally distant / clear Abd: infrequent bowel sounds, abdominal dressing in place, colostomy  Ext: trace edema Skin: no rash   LABS:  CBC  Recent Labs Lab 04/29/16 0516 04/30/16 0400 05/01/16 0645  HGB 8.0* 7.8* 7.8*  HCT 27.2* 26.4* 26.7*  WBC 6.4 6.7 7.0  PLT 328 290 268   CHEMISTRY  Recent Labs Lab 04/27/16 0345 04/28/16 0408 04/29/16 0516 04/30/16 0400 05/01/16 0645  NA 136 134* 137 135 135  K 3.4* 3.6 3.9 3.7 3.9  CL 94* 95* 97* 98* 97*  CO2 30 30 30 29 27   GLUCOSE 93 93 85 85 88  BUN 30* 27* 17 12 10   CREATININE 0.79 0.84 0.82 0.83 0.97  CALCIUM 8.4* 8.4* 8.6* 8.2* 8.1*  PHOS 3.6 3.8 3.9 3.7 4.0   Estimated Creatinine Clearance: 86.6 mL/min (by C-G formula based on Cr of 0.97).  LIVER  Recent Labs Lab 04/27/16 0345 04/28/16 0408 04/29/16 0516 04/30/16 0400 05/01/16 0645  ALBUMIN 2.1* 2.0* 2.0* 2.1* 2.1*   IMAGING x48h No results found.  ASSESSMENT / PLAN:  PULMONARY A: Acute resp failure, prolonged ventialtion s/p tracheostomy - now doing well with trach collar trials Hx of COPD with asthma, OSA -  wheezing noted 4/14 Tobacco abuse P:   Continue trach collar SLP for PMV Continue # 6 cuffless.  No need to go to #4 at this point.   Consider decannulation post discharge / need to see her more independent, wounds healed etc Will need trach clinic follow up at discharge  Titrate O2 for sat of 88-92%. Brovana, pulmicort with prn albuterol  CARDIOVASCULAR A:  Septic shock - resolved Hx of HTN Echo - nml LV fn, nml RV P:  Cont  to monitor in SDU  RENAL A:   AKI - resolved Hyponatremia P:   Monitor BMET and UOP Replace electrolytes as needed  GASTROINTESTINAL A:   Peritonitis s/p laparotomy Protein calorie malnutrition  Morbid obesity P:   Post-op care, nutrition per CCS Protonix for SUP  HEMATOLOGIC A:   Anemia of critical illness, acute blood loss 4/27  P:  Transfuse for Hb < 7 Lovenox for DVT prevention Limit phlebotomy as able   INFECTIOUS A:   Septic shock 2nd to peritonitis and cellulitis of ostomy site - resolved Coag neg Staph bacteremia, UTI - treated with vancomycin  x 7ds, Rpt blood cx shows clearance  Hx of PCN allergy P:   Monitor off of antibiotics Trend fever curve / wbc  ENDOCRINE A:   Hx of hypothyroidism P:   Continue synthroid  NEUROLOGIC A:   Acute metabolic encephalopathy - resolved  Pain P:   fent prn + fentanyl patch  Push PT efforts   PCCM will see T/Th for trach needs.  Please call sooner if new needs arise.   Noe Gens, NP-C [facility] Pgr: [phone] or if no answer [phone] 05/01/2016, 2:06 PM   STAFF NOTE: I, Merrie Roof, MD FACP have personally reviewed patient's available data, including medical history, events of note, physical examination and test results as part of my evaluation. I have discussed with resident/NP and other care providers such as pharmacist, RN and RRT. In addition, I personally evaluated patient and elicited key findings of: good spirits, no distress, wearing pMV all day, eating well, no secretions, excellent speech, her ambulatory status pr hospital wasnt great, so I would be looking for her to be upright in chair long durations then consider decannulation, she has progressed well, will revisit Monday to consider this, would be nice to see some improved mobility    Lavon Paganini. Titus Mould, MD, [place] Pgr: [facility] 05/01/2016 3:53 PM '

## 2016-05-01 NOTE — Progress Notes (Signed)
No blood return noted from pt's PICC line.  IV team consulted.  Will continue to monitor situation.

## 2016-05-01 NOTE — Progress Notes (Signed)
Nutrition Follow-up  DOCUMENTATION CODES:   Morbid obesity  INTERVENTION:    Ensure Enlive po BID, each supplement provides 350 kcal and 20 grams of protein  MVI daily  NUTRITION DIAGNOSIS:   Increased nutrient needs related to wound healing as evidenced by estimated needs.  Ongoing  GOAL:   Patient will meet greater than or equal to 90% of their needs  Progressing  MONITOR:   PO intake, Supplement acceptance, Weight trends, Skin, I & O's, Labs  ASSESSMENT:   60 y.o. Female presented with postoperative complication. 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 She went to a nearby hospital and was diagnosed with perforated diverticulitis. She underwent an exploratory laparotomy with sigmoid colon resection on March 20. She was discharged from the hospital there after a week and just returned home to [place] 5 days ago. Yesterday she reports developing leakage of fecal matter from her midline surgical incision site. She continues to have normal ostomy output from her left lower quadrant stoma. Over the last 24 hours she has also experienced increased generalized abdominal pain  Diet has been advanced to regular with thin liquids. Patient reports good intake when she is allowed to choose her meals. She is consuming ~75% of meals. She likes Ensure, has some at home. Discussed with patient need for additional protein to support healing. Patient agreeable to receive Ensure Enlive supplements BID.   Diet Order:  Diet regular Room service appropriate?: Yes; Fluid consistency:: Thin  Skin:  Wound (see comment) (abdominal wound VAC)  Last BM:  5/8  Height:   Ht Readings from Last 1 Encounters:  04/28/16 5\' 6"  (1.676 m)    Weight:   Wt Readings from Last 1 Encounters:  05/01/16 306 lb 7 oz (139 kg)    Ideal Body Weight:  59 kg  BMI:  Body mass index is 49.48 kg/(m^2).  Estimated Nutritional  Needs:   Kcal:  1800-2000  Protein:  145-155 gm  Fluid:  per MD  EDUCATION NEEDS:   No education needs identified at this time  Molli Barrows, [place], [place], [place] Pager [phone] After Hours Pager [phone]

## 2016-05-01 NOTE — Care Management Note (Signed)
Case Management Note  Patient Details  Name: YURIKA HOLLON MRN: [id number] Date of Birth: May 11, 1956  Subjective/Objective:     NCM received call back from [facility] and they state they do no think patient is ready for inpatient rehab just yet and it will probably be better for her to do rehab here at [facility] where MD 's can follow her.  NCM awaiting to hear from [facility] in [place]  Per MD patient will be for decannulation maybe today after she checks with PCCM.  NCM will cont to follow for dc needs.               Action/Plan:   Expected Discharge Date:                  Expected Discharge Plan:  [place]  In-House Referral:     Discharge planning Services  CM Consult  Post Acute Care Choice:    Choice offered to:     DME Arranged:    DME Agency:     HH Arranged:    HH Agency:     Status of Service:  In process, will continue to follow  Medicare Important Message Given:    Date Medicare IM Given:    Medicare IM give by:    Date Additional Medicare IM Given:    Additional Medicare Important Message give by:     If discussed at [place] of Stay Meetings, dates discussed:    Additional Comments:  Zenon Mayo, RN 05/01/2016, 12:42 PM

## 2016-05-01 NOTE — Progress Notes (Signed)
Rehab admissions - I am opening the case requesting acute inpatient rehab admission.  Currently rehab beds are full today, but I do have beds opening each day this week.  I will update all once I hear back from [facility] case manager.  Call me for questions.  [phone]

## 2016-05-01 NOTE — Progress Notes (Signed)
[facility] Progress Note  13 Days Post-Op  Subjective: Pt depressed, wants to go to rehab, but she's having a hard time getting OOB due to deconditioning.  Having good bowel function and tolerating a regular diet.  Pain is getting better controlled.    Objective: Vital signs in last 24 hours: Temp:  [97.4 F (36.3 C)-98.9 F (37.2 C)] 98.2 F (36.8 C) (05/09 0821) Pulse Rate:  [56-82] 57 (05/09 0750) Resp:  [13-19] 14 (05/09 0750) BP: (109-141)/(52-69) 141/62 mmHg (05/09 0750) SpO2:  [96 %-100 %] 100 % (05/09 0750) FiO2 (%):  [28 %] 28 % (05/09 0750) Weight:  [306 lb 7 oz (139 kg)] 306 lb 7 oz (139 kg) (05/09 0439) Last BM Date: 04/30/16  Intake/Output from previous day: 05/08 0701 - 05/09 0700 In: 625 [P.O.:480; I.V.:145] Out: 550 [Urine:550] Intake/Output this shift: Total I/O In: 0  Out: 100 [Urine:100]  PE: Gen:  Alert, NAD, pleasant Card:  RRR, no M/G/R heard Pulm:  CTA, no W/R/R, good effort Abd: Obese, soft, mild distension, mild tenderness, +BS, no HSM, midline wound/ostomy site mostly clean, serous drainage, ulceration noted to ABRA sites    Lab Results:   Recent Labs  04/30/16 0400 05/01/16 0645  WBC 6.7 7.0  HGB 7.8* 7.8*  HCT 26.4* 26.7*  PLT 290 268   BMET  Recent Labs  04/30/16 0400 05/01/16 0645  NA 135 135  K 3.7 3.9  CL 98* 97*  CO2 29 27  GLUCOSE 85 88  BUN 12 10  CREATININE 0.83 0.97  CALCIUM 8.2* 8.1*   PT/INR No results for input(s): LABPROT, INR in the last 72 hours. CMP     Component Value Date/Time   NA 135 05/01/2016 0645   K 3.9 05/01/2016 0645   CL 97* 05/01/2016 0645   CO2 27 05/01/2016 0645   GLUCOSE 88 05/01/2016 0645   BUN 10 05/01/2016 0645   CREATININE 0.97 05/01/2016 0645   CALCIUM 8.1* 05/01/2016 0645   PROT 6.3* 04/23/2016 0509   ALBUMIN 2.1* 05/01/2016 0645   AST 47* 04/23/2016 0509   ALT 37 04/23/2016 0509   ALKPHOS 86 04/23/2016 0509   BILITOT 0.4 04/23/2016 0509   GFRNONAA >60  05/01/2016 0645   GFRAA >60 05/01/2016 0645   Lipase     Component Value Date/Time   LIPASE 64* 03/28/2016 1705       Studies/Results: No results found.  Anti-infectives: Anti-infectives    Start     Dose/Rate Route Frequency Ordered Stop   04/18/16 1000  ciprofloxacin (CIPRO) IVPB 400 mg  Status:  Discontinued     400 mg 200 mL/hr over 60 Minutes Intravenous To Short Stay 04/17/16 1026 04/18/16 1340   04/03/16 2100  vancomycin (VANCOCIN) 1,500 mg in sodium chloride 0.9 % 500 mL IVPB  Status:  Discontinued     1,500 mg 250 mL/hr over 120 Minutes Intravenous Every 12 hours 04/02/16 2020 04/06/16 0943   04/02/16 2030  vancomycin (VANCOCIN) 2,500 mg in sodium chloride 0.9 % 500 mL IVPB     2,500 mg 250 mL/hr over 120 Minutes Intravenous  Once 04/02/16 2020 04/02/16 2306   03/29/16 1200  ciprofloxacin (CIPRO) IVPB 400 mg  Status:  Discontinued     400 mg 200 mL/hr over 60 Minutes Intravenous Every 12 hours 03/29/16 0213 04/06/16 0943   03/29/16 0600  [MAR Hold]  ciprofloxacin (CIPRO) IVPB 400 mg     (MAR Hold since 03/28/16 2357)   400 mg 200 mL/hr  over 60 Minutes Intravenous On call to O.R. 03/28/16 2306 03/28/16 2355   03/29/16 0600  [MAR Hold]  metroNIDAZOLE (FLAGYL) IVPB 500 mg     (MAR Hold since 03/28/16 2357)   500 mg 100 mL/hr over 60 Minutes Intravenous On call to O.R. 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 Hulen Skains  Left colectomy and end colostomy (03/30/16)--Dr. Donne Hazel Re-exploration of open abdomen and application of wound vac (04/02/16)--Dr. Redmond Pulling Placement of ABRA abdominal wall closure device (04/05/16)--Dr. Redmond Pulling Removal of ABRA device and closure of abdominal wall (04/18/16)--Dr. Cornett -PT/OT recommending inpatient rehab, this is likely most appropriate for her.  SNF as back up -Continue BID wet to dry dressing changes to previous ostomy site and midline wound.  -Continue with retention sutures, WOC following to help with skin breakdown from retention sutures and ABRA elastomer sites -Pain controlled with orals and fentanyl patch Septic shock-resolved AKI-resolved Anemia of critical illness-stable, Hgb 7.8 Hypothyroidism-home meds Acute respiratory failure-s/p trach, down sized per CCM, appreciate assistance.  ?Plan to d/c 24 hours prior to discharge so we can monitor her before discharge, maybe today? VTE prophylaxis-SCD/lovenox PCM-on Reg diet Dispo-Medically ready for CIR, but still challenged with mobility, to CIR when bed available    LOS: 34 days    Nat Christen 05/01/2016, 8:58 AM Pager: [phone]  (7am - 4:30pm M-F; 7am - 11:30am Sa/Su)

## 2016-05-02 ENCOUNTER — Inpatient Hospital Stay (HOSPITAL_COMMUNITY)
Admission: RE | Admit: 2016-05-02 | Discharge: 2016-05-23 | DRG: 189 | Disposition: A | Discharge status: Home Health | Payer: BLUE CROSS/BLUE SHIELD | Source: Intra-hospital | Attending: Physical Medicine & Rehabilitation | Admitting: Physical Medicine & Rehabilitation

## 2016-05-02 ENCOUNTER — Encounter (HOSPITAL_COMMUNITY): Payer: Self-pay | Admitting: *Deleted

## 2016-05-02 DIAGNOSIS — Z79899 Other long term (current) drug therapy: Secondary | ICD-10-CM | POA: Diagnosis not present

## 2016-05-02 DIAGNOSIS — M25561 Pain in right knee: Secondary | ICD-10-CM | POA: Diagnosis not present

## 2016-05-02 DIAGNOSIS — R609 Edema, unspecified: Secondary | ICD-10-CM | POA: Insufficient documentation

## 2016-05-02 DIAGNOSIS — M109 Gout, unspecified: Secondary | ICD-10-CM

## 2016-05-02 DIAGNOSIS — Y836 Removal of other organ (partial) (total) as the cause of abnormal reaction of the patient, or of later complication, without mention of misadventure at the time of the procedure: Secondary | ICD-10-CM | POA: Diagnosis not present

## 2016-05-02 DIAGNOSIS — M25562 Pain in left knee: Secondary | ICD-10-CM | POA: Diagnosis not present

## 2016-05-02 DIAGNOSIS — F17210 Nicotine dependence, cigarettes, uncomplicated: Secondary | ICD-10-CM | POA: Diagnosis not present

## 2016-05-02 DIAGNOSIS — T8131XA Disruption of external operation (surgical) wound, not elsewhere classified, initial encounter: Secondary | ICD-10-CM

## 2016-05-02 DIAGNOSIS — Z930 Tracheostomy status: Secondary | ICD-10-CM | POA: Diagnosis not present

## 2016-05-02 DIAGNOSIS — D62 Acute posthemorrhagic anemia: Secondary | ICD-10-CM | POA: Diagnosis not present

## 2016-05-02 DIAGNOSIS — E8809 Other disorders of plasma-protein metabolism, not elsewhere classified: Secondary | ICD-10-CM | POA: Insufficient documentation

## 2016-05-02 DIAGNOSIS — R5381 Other malaise: Secondary | ICD-10-CM | POA: Diagnosis present

## 2016-05-02 DIAGNOSIS — I10 Essential (primary) hypertension: Secondary | ICD-10-CM | POA: Diagnosis not present

## 2016-05-02 DIAGNOSIS — E46 Unspecified protein-calorie malnutrition: Secondary | ICD-10-CM | POA: Diagnosis not present

## 2016-05-02 DIAGNOSIS — R946 Abnormal results of thyroid function studies: Secondary | ICD-10-CM

## 2016-05-02 DIAGNOSIS — K632 Fistula of intestine: Secondary | ICD-10-CM | POA: Diagnosis present

## 2016-05-02 DIAGNOSIS — G629 Polyneuropathy, unspecified: Secondary | ICD-10-CM

## 2016-05-02 DIAGNOSIS — J9610 Chronic respiratory failure, unspecified whether with hypoxia or hypercapnia: Principal | ICD-10-CM

## 2016-05-02 DIAGNOSIS — R601 Generalized edema: Secondary | ICD-10-CM

## 2016-05-02 DIAGNOSIS — G4733 Obstructive sleep apnea (adult) (pediatric): Secondary | ICD-10-CM

## 2016-05-02 DIAGNOSIS — H6120 Impacted cerumen, unspecified ear: Secondary | ICD-10-CM

## 2016-05-02 DIAGNOSIS — Z7951 Long term (current) use of inhaled steroids: Secondary | ICD-10-CM | POA: Diagnosis not present

## 2016-05-02 DIAGNOSIS — G894 Chronic pain syndrome: Secondary | ICD-10-CM | POA: Insufficient documentation

## 2016-05-02 DIAGNOSIS — Z6841 Body Mass Index (BMI) 40.0 and over, adult: Secondary | ICD-10-CM | POA: Diagnosis not present

## 2016-05-02 DIAGNOSIS — R531 Weakness: Secondary | ICD-10-CM | POA: Insufficient documentation

## 2016-05-02 DIAGNOSIS — Z720 Tobacco use: Secondary | ICD-10-CM

## 2016-05-02 DIAGNOSIS — F4323 Adjustment disorder with mixed anxiety and depressed mood: Secondary | ICD-10-CM | POA: Diagnosis present

## 2016-05-02 DIAGNOSIS — J449 Chronic obstructive pulmonary disease, unspecified: Secondary | ICD-10-CM

## 2016-05-02 DIAGNOSIS — E039 Hypothyroidism, unspecified: Secondary | ICD-10-CM | POA: Diagnosis not present

## 2016-05-02 DIAGNOSIS — Z9049 Acquired absence of other specified parts of digestive tract: Secondary | ICD-10-CM | POA: Diagnosis not present

## 2016-05-02 DIAGNOSIS — Z933 Colostomy status: Secondary | ICD-10-CM | POA: Insufficient documentation

## 2016-05-02 LAB — CBC WITH DIFFERENTIAL/PLATELET
BASOS ABS: 0 10*3/uL (ref 0.0–0.1)
BASOS PCT: 0 %
Eosinophils Absolute: 0.2 10*3/uL (ref 0.0–0.7)
Eosinophils Relative: 3 %
HEMATOCRIT: 25.5 % — AB (ref 36.0–46.0)
HEMOGLOBIN: 7.6 g/dL — AB (ref 12.0–15.0)
Lymphocytes Relative: 40 %
Lymphs Abs: 3.2 10*3/uL (ref 0.7–4.0)
MCH: 27.8 pg (ref 26.0–34.0)
MCHC: 29.8 g/dL — AB (ref 30.0–36.0)
MCV: 93.4 fL (ref 78.0–100.0)
MONOS PCT: 9 %
Monocytes Absolute: 0.7 10*3/uL (ref 0.1–1.0)
NEUTROS ABS: 3.9 10*3/uL (ref 1.7–7.7)
NEUTROS PCT: 48 %
PLATELETS: 264 10*3/uL (ref 150–400)
RBC: 2.73 MIL/uL — AB (ref 3.87–5.11)
RDW: 17.9 % — ABNORMAL HIGH (ref 11.5–15.5)
WBC: 8 10*3/uL (ref 4.0–10.5)

## 2016-05-02 LAB — GLUCOSE, CAPILLARY
GLUCOSE-CAPILLARY: 81 mg/dL (ref 65–99)
Glucose-Capillary: 83 mg/dL (ref 65–99)
Glucose-Capillary: 112 mg/dL — ABNORMAL HIGH (ref 65–99)
Glucose-Capillary: 99 mg/dL (ref 65–99)

## 2016-05-02 LAB — RENAL FUNCTION PANEL
ANION GAP: 7 (ref 5–15)
Albumin: 2 g/dL — ABNORMAL LOW (ref 3.5–5.0)
BUN: 12 mg/dL (ref 6–20)
CALCIUM: 8.1 mg/dL — AB (ref 8.9–10.3)
CO2: 28 mmol/L (ref 22–32)
Chloride: 101 mmol/L (ref 101–111)
Creatinine, Ser: 0.85 mg/dL (ref 0.44–1.00)
GFR calc non Af Amer: >60 mL/min (ref >60)
Glucose, Bld: 90 mg/dL (ref 65–99)
Phosphorus: 3.5 mg/dL (ref 2.5–4.6)
Potassium: 4 mmol/L (ref 3.5–5.1)
SODIUM: 136 mmol/L (ref 135–145)

## 2016-05-02 MED ORDER — GUAIFENESIN-DM 100-10 MG/5ML PO SYRP
5.0000 mL | ORAL_SOLUTION | Freq: Four times a day (QID) | ORAL | Status: DC | PRN
  Administered 2016-05-08 – 2016-05-09 (×2): 10 mL via ORAL
  Filled 2016-05-02 (×2): qty 10

## 2016-05-02 MED ORDER — PANTOPRAZOLE SODIUM 40 MG PO TBEC
40.0000 mg | DELAYED_RELEASE_TABLET | Freq: Every day | ORAL | Status: DC
  Administered 2016-05-03 – 2016-05-23 (×21): 40 mg via ORAL
  Filled 2016-05-02 (×21): qty 1

## 2016-05-02 MED ORDER — SODIUM CHLORIDE 0.9% FLUSH
10.0000 mL | INTRAVENOUS | Status: DC | PRN
  Administered 2016-05-04 – 2016-05-06 (×3): 30 mL
  Administered 2016-05-07 (×2): 10 mL
  Filled 2016-05-02 (×5): qty 40

## 2016-05-02 MED ORDER — ARFORMOTEROL TARTRATE 15 MCG/2ML IN NEBU
15.0000 ug | INHALATION_SOLUTION | Freq: Two times a day (BID) | RESPIRATORY_TRACT | Status: DC
  Administered 2016-05-02 – 2016-05-22 (×38): 15 ug via RESPIRATORY_TRACT
  Filled 2016-05-02 (×42): qty 2

## 2016-05-02 MED ORDER — TRAZODONE HCL 50 MG PO TABS
25.0000 mg | ORAL_TABLET | Freq: Every evening | ORAL | Status: DC | PRN
  Administered 2016-05-02 – 2016-05-22 (×21): 50 mg via ORAL
  Filled 2016-05-02 (×21): qty 1

## 2016-05-02 MED ORDER — CETYLPYRIDINIUM CHLORIDE 0.05 % MT LIQD
7.0000 mL | Freq: Two times a day (BID) | OROMUCOSAL | Status: DC
  Administered 2016-05-03 – 2016-05-22 (×25): 7 mL via OROMUCOSAL

## 2016-05-02 MED ORDER — ONDANSETRON HCL 4 MG PO TABS
4.0000 mg | ORAL_TABLET | Freq: Four times a day (QID) | ORAL | Status: DC | PRN
  Administered 2016-05-07 – 2016-05-17 (×5): 4 mg via ORAL
  Filled 2016-05-02 (×6): qty 1

## 2016-05-02 MED ORDER — ALBUTEROL SULFATE (2.5 MG/3ML) 0.083% IN NEBU
2.5000 mg | INHALATION_SOLUTION | RESPIRATORY_TRACT | Status: DC | PRN
  Administered 2016-05-03 – 2016-05-07 (×4): 2.5 mg via RESPIRATORY_TRACT
  Filled 2016-05-02 (×4): qty 3

## 2016-05-02 MED ORDER — BUDESONIDE 0.5 MG/2ML IN SUSP
0.5000 mg | Freq: Two times a day (BID) | RESPIRATORY_TRACT | Status: DC
  Administered 2016-05-02 – 2016-05-22 (×39): 0.5 mg via RESPIRATORY_TRACT
  Filled 2016-05-02 (×45): qty 2

## 2016-05-02 MED ORDER — SENNOSIDES-DOCUSATE SODIUM 8.6-50 MG PO TABS
1.0000 | ORAL_TABLET | Freq: Every evening | ORAL | Status: DC | PRN
Start: 2016-05-02 — End: 2016-05-23
  Administered 2016-05-13 – 2016-05-16 (×2): 1 via ORAL
  Filled 2016-05-02 (×2): qty 1

## 2016-05-02 MED ORDER — SODIUM CHLORIDE 0.9% FLUSH
10.0000 mL | Freq: Two times a day (BID) | INTRAVENOUS | Status: DC
  Administered 2016-05-03: 10 mL
  Administered 2016-05-05: 30 mL
  Administered 2016-05-09: 10 mL

## 2016-05-02 MED ORDER — INSULIN ASPART 100 UNIT/ML ~~LOC~~ SOLN: 0.0000 [IU] | Freq: Every day | SUBCUTANEOUS | Status: DC

## 2016-05-02 MED ORDER — ADULT MULTIVITAMIN W/MINERALS CH
1.0000 | ORAL_TABLET | Freq: Every day | ORAL | Status: DC
  Administered 2016-05-03 – 2016-05-23 (×21): 1 via ORAL
  Filled 2016-05-02 (×21): qty 1

## 2016-05-02 MED ORDER — DIPHENHYDRAMINE HCL 12.5 MG/5ML PO ELIX
12.5000 mg | ORAL_SOLUTION | Freq: Four times a day (QID) | ORAL | Status: DC | PRN
Start: 2016-05-02 — End: 2016-05-23
  Administered 2016-05-05 – 2016-05-10 (×3): 25 mg via ORAL
  Filled 2016-05-02 (×2): qty 10
  Filled 2016-05-02: qty 100

## 2016-05-02 MED ORDER — ENOXAPARIN SODIUM 40 MG/0.4ML ~~LOC~~ SOLN
40.0000 mg | SUBCUTANEOUS | Status: DC
  Administered 2016-05-02 – 2016-05-22 (×21): 40 mg via SUBCUTANEOUS
  Filled 2016-05-02 (×21): qty 0.4

## 2016-05-02 MED ORDER — CHLORHEXIDINE GLUCONATE 0.12% ORAL RINSE (MEDLINE KIT)
15.0000 mL | Freq: Two times a day (BID) | OROMUCOSAL | Status: DC
  Administered 2016-05-03 – 2016-05-21 (×15): 15 mL via OROMUCOSAL

## 2016-05-02 MED ORDER — LEVOTHYROXINE SODIUM 100 MCG PO TABS
200.0000 ug | ORAL_TABLET | Freq: Every day | ORAL | Status: DC
Start: 2016-05-03 — End: 2016-05-23
  Administered 2016-05-03 – 2016-05-23 (×21): 200 ug via ORAL
  Filled 2016-05-02 (×21): qty 2

## 2016-05-02 MED ORDER — ACETAMINOPHEN 325 MG PO TABS
325.0000 mg | ORAL_TABLET | ORAL | Status: DC | PRN
  Administered 2016-05-02 – 2016-05-21 (×13): 650 mg via ORAL
  Filled 2016-05-02 (×16): qty 2

## 2016-05-02 MED ORDER — OXYCODONE HCL 5 MG PO TABS
10.0000 mg | ORAL_TABLET | ORAL | Status: DC | PRN
Start: 2016-05-02 — End: 2016-05-04
  Administered 2016-05-03 (×3): 15 mg via ORAL
  Administered 2016-05-04: 10 mg via ORAL
  Administered 2016-05-04: 5 mg via ORAL
  Administered 2016-05-04 (×2): 15 mg via ORAL
  Filled 2016-05-02 (×2): qty 3
  Filled 2016-05-02: qty 2
  Filled 2016-05-02 (×5): qty 3

## 2016-05-02 MED ORDER — ALUM & MAG HYDROXIDE-SIMETH 200-200-20 MG/5ML PO SUSP
30.0000 mL | ORAL | Status: DC | PRN
Start: 2016-05-02 — End: 2016-05-23

## 2016-05-02 MED ORDER — INSULIN ASPART 100 UNIT/ML ~~LOC~~ SOLN: 0.0000 [IU] | Freq: Three times a day (TID) | SUBCUTANEOUS | Status: DC

## 2016-05-02 MED ORDER — ENSURE ENLIVE PO LIQD
237.0000 mL | Freq: Two times a day (BID) | ORAL | Status: DC
  Administered 2016-05-02 – 2016-05-21 (×27): 237 mL via ORAL

## 2016-05-02 MED ORDER — FLEET ENEMA 7-19 GM/118ML RE ENEM: 1.0000 | ENEMA | Freq: Once | RECTAL | Status: DC | PRN

## 2016-05-02 MED ORDER — ONDANSETRON HCL 4 MG/2ML IJ SOLN: 4.0000 mg | Freq: Four times a day (QID) | INTRAMUSCULAR | Status: DC | PRN

## 2016-05-02 MED ORDER — FENTANYL 100 MCG/HR TD PT72
100.0000 ug | MEDICATED_PATCH | TRANSDERMAL | Status: DC
  Administered 2016-05-04 – 2016-05-19 (×6): 100 ug via TRANSDERMAL
  Filled 2016-05-02 (×7): qty 1

## 2016-05-02 NOTE — Progress Notes (Signed)
Patient ID: Christina Castaneda, female   DOB: Jun 20, 1956, 60 y.o.   MRN: [id number] Patient arrived from 3S in Bariatric bed with RN and belongings. Respiratory notified patient on unit for patient to have trach collar attached to O2. Patient oriented to nurse call system, health resource notebook, rehab schedule, fall prevention plan, rehab safety plan, and rehab process. Patient resting comfortably in bed with no complaints of pain and call bell at side.

## 2016-05-02 NOTE — Progress Notes (Signed)
Rehab admissions - I have approval from [facility] for acute inpatient rehab admission here at [facility].  Bed available and will admit to inpatient rehab today.  Call me for questions.  [phone]

## 2016-05-02 NOTE — H&P (View-Only) (Signed)
Physical Medicine and Rehabilitation Admission H&P    Chief Complaint  Patient presents with  . Debility due to sepsis and multiple medical issues     HPI: Christina Castaneda is a 60 year old female with history of COPD with OSA, morbid obesity, vitamin B 12 deficiency, perforated diverticulitis s/p sigmoid colectomy with colostomy in [place], [place] on 03/20 who was discharged to home in [place], [place]. At follow up appointment for staple removal, she was found to have feculent discharge and was admitted via ED on 03/28/16. 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 Hulen Skains on 04/05. Abdomen kept open with wound VAC required multiple procedures including ABRA device to help with final wound closure on 04/18/16.  She continues to have retention sutures as well as evidence of breakdown and wet to dry dressing to prior ostomy site. Septic shock resolved and she required tracheostomy due to inability to tolerate vent wean.  She is tolerating PMSV  And was started on regular diet on 05/04.  CCM recommends leaving cuffed #6 in place until ready for decannulation--they will follow weekly for input.  Cortrack removed on 05/05 and patient encouraged to increase po intake to help with healing. Therapy ongoing and patient noted to be severely deconditioned and working on pre-gait activity. CIR recommended by rehab team for follow up therapy.     Review of Systems  Constitutional: Positive for malaise/fatigue.  HENT: Negative for hearing loss.   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   Cardiovascular: Negative for chest pain and palpitations.  Gastrointestinal: Negative for heartburn, nausea, vomiting and constipation.  Genitourinary: Negative for dysuria and urgency.    Musculoskeletal: Negative for myalgias, back pain and joint pain.  Skin: Negative for itching and rash.  Neurological: Positive for sensory change (feet and legs numb--"can't stand or walk on nerf balls") and weakness. Negative for focal weakness and headaches.  All other systems reviewed and are negative.     Past Medical History  Diagnosis Date  . Goiter   . Hypothyroidism   . Asthma     mild  . Hypertension   . Atypical endometrial hyperplasia   . Heart murmur   . Vitamin B12 deficiency   . Yeast infection     for last 3 weeks  . Gout   . COPD (chronic obstructive pulmonary disease) [facility])     Past Surgical History  Procedure Laterality Date  . Neck surgery  1994    repair disk  . Tonsillectomy      as a child  . Cholecystectomy  2009    gallstone removed  . Robotic assisted total hysterectomy with bilateral salpingo oopherectomy  11/18/2012    Procedure: ROBOTIC ASSISTED TOTAL HYSTERECTOMY WITH BILATERAL SALPINGO OOPHORECTOMY;  Surgeon: Imagene Gurney A. Alycia Rossetti, MD;  Location: [facility];  Service: Gynecology;  Laterality: N/A;  . Colostomy revision N/A 03/28/2016    Procedure: COLOSTOMY REVISION;  Surgeon: Judeth Horn, MD;  Location: [place];  Service: General;  Laterality: N/A;  . Laparotomy N/A 03/28/2016    Procedure: EXPLORATORY LAPAROTOMY;  Surgeon: Judeth Horn, MD;  Location: [place];  Service: General;  Laterality: N/A;  . Omentectomy N/A 03/28/2016    Procedure: OMENTECTOMY;  Surgeon: Judeth Horn, MD;  Location:  [place] [facility];  Service: General;  Laterality: N/A;  . Wound debridement N/A 03/28/2016    Procedure: DEBRIDEMENT WOUND;  Surgeon: Judeth Horn, MD;  Location: [place];  Service: General;  Laterality: N/A;  . Vacuum assisted closure change N/A 03/30/2016    Procedure: ABDOMINAL VAC CHANGE;  Surgeon: Rolm Bookbinder, MD;  Location: [place];  Service: General;  Laterality: N/A;  . Colostomy N/A 03/30/2016    Procedure: COLOSTOMY;  Surgeon: Rolm Bookbinder, MD;  Location: [place];  Service:  General;  Laterality: N/A;  . Colostomy revision N/A 03/30/2016    Procedure: COLON RESECTION LEFT;  Surgeon: Rolm Bookbinder, MD;  Location: [place];  Service: General;  Laterality: N/A;  . Laparotomy N/A 04/02/2016    Procedure: Re-exploration of open abdomen, application of abdominal wound vac;  Surgeon: Greer Pickerel, MD;  Location: [place];  Service: General;  Laterality: N/A;  . Application of wound vac N/A 04/02/2016    Procedure: application of wound vac+;  Surgeon: Greer Pickerel, MD;  Location: [place];  Service: General;  Laterality: N/A;  . Laparotomy N/A 04/04/2016    Procedure: EXPLORATORY LAPAROTOMY, PLACEMENT OF ABRA ABDOMINAL WALL CLOSURE SET ;  Surgeon: Greer Pickerel, MD;  Location: [place];  Service: General;  Laterality: N/A;  . Laparotomy N/A 04/18/2016    Procedure: EXPLORATORY LAPAROTOMY;  Surgeon: Erroll Luna, MD;  Location: [place];  Service: General;  Laterality: N/A;  . Abdominal wall defect repair N/A 04/18/2016    Procedure: CLOSURE OF ABDOMEN;  Surgeon: Erroll Luna, MD;  Location: [facility];  Service: General;  Laterality: N/A;    Family History  Problem Relation Age of Onset  . COPD Mother   . Diabetes Mother   . Congestive Heart Failure Mother   . Diabetes Father   . 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  Live in [place] and independent without AD. Sedentary and sits most of the time. She had difficulty standing for prolonge  due to foot pain/numbness. She reports that she has been smoking Cigarettes--1 1/2 PPD.  She has a 28 pack-year smoking history. She has never used smokeless tobacco. She reports that she drinks about 0.5 - 1.5 oz of alcohol per week. She reports that she does not use illicit drugs.    Allergies  Allergen Reactions  . Penicillins Anaphylaxis, Hives, Shortness Of Breath, Swelling and Other (See Comments)    Angioedema    Medications Prior to Admission  Medication Sig Dispense Refill  . ADVAIR DISKUS 250-50  MCG/DOSE AEPB Once in the am & once at pm    . albuterol (PROVENTIL HFA;VENTOLIN HFA) 108 (90 BASE) MCG/ACT inhaler Inhale 2 puffs into the lungs every 6 (six) hours as needed. Wheezing and shortness of breath    . ergocalciferol (VITAMIN D2) 50000 UNITS capsule Take 50,000 Units by mouth once a week.    . Febuxostat (ULORIC) 80 MG TABS Take 80 mg by mouth daily before breakfast.     . levothyroxine (SYNTHROID, LEVOTHROID) 200 MCG tablet Take 200 mcg by mouth daily before breakfast.     . lisinopril (PRINIVIL,ZESTRIL) 20 MG tablet Take 20 mg by mouth daily.    Marland Kitchen nystatin cream (MYCOSTATIN) Apply topically 2 (two) times daily. For yeast infection      Home: Home Living Family/patient expects to be discharged to:: Private residence Living Arrangements: Spouse/significant other Available Help at Discharge: Family, Available PRN/intermittently Type of Home: House Home Access: Stairs to  enter Entrance Stairs-Number of Steps: 2 Entrance Stairs-Rails: Right Home Layout: Two level, Laundry or work area in basement, Able to live on main level with bedroom/bathroom Bathroom Shower/Tub: Chiropodist: Standard Home Equipment: Environmental consultant - 2 wheels, Bedside commode   Functional History: Prior Function Level of Independence: Independent  Functional Status:  Mobility: Bed Mobility Overal bed mobility: Needs Assistance, +2 for physical assistance Bed Mobility: Sit to Supine Rolling: Min assist Sidelying to sit: Mod assist Sit to supine: Mod assist, +2 for physical assistance Sit to sidelying: Mod assist, +2 for physical assistance General bed mobility comments: rolling in bed for using bedpan and hygiene as well as donning abdominal binder prior to assist to elevate trunk with +2 for safety Transfers Overall transfer level: Needs assistance Equipment used: Ambulation equipment used Clarise Cruz Plus) Transfer via Lift Equipment: Marketing executive Transfers: Sit to/from Stand Sit to Stand:  +2 physical assistance, Mod assist Stand pivot transfers: Mod assist, +2 physical assistance General transfer comment: assist to stand with Clarise Cruz Plus, but pt unable to come up all the way to standing due to fatigued after dressing changes and having to switch out [employer] due to not working, but attempted x 3 with ability to lift hips from EOB x 3 attempts, but not to come all the way up.  Ambulation/Gait General Gait Details: Not ready yet    ADL: ADL Overall ADL's : Needs assistance/impaired Eating/Feeding: NPO Grooming: Wash/dry hands, Oral care, Sitting, Minimal assistance Upper Body Bathing: Moderate assistance, Sitting Upper Body Bathing Details (indicate cue type and reason): washed pt's back while in sitting Lower Body Bathing: Total assistance, Bed level Upper Body Dressing : Bed level, Moderate assistance Lower Body Dressing: Total assistance, Bed level Lower Body Dressing Details (indicate cue type and reason): socks Toileting- Clothing Manipulation and Hygiene: Total assistance, Bed level  Cognition: Cognition Overall Cognitive Status: Within Functional Limits for tasks assessed Orientation Level: Oriented X4 Cognition Arousal/Alertness: Awake/alert Behavior During Therapy: WFL for tasks assessed/performed Overall Cognitive Status: Within Functional Limits for tasks assessed  Physical Exam: Blood pressure 124/55, pulse 52, temperature 98.5 F (36.9 C), temperature source Oral, resp. rate 18, height _0  (1.676 m), weight 141 kg (310 lb 13.6 oz), last menstrual period 11/18/2012, SpO2 96 %. Physical Exam  Nursing note and vitals reviewed. Constitutional: She is oriented to person, place, and time. She appears well-developed and well-nourished.  Morbidly obese female  HENT:  Head: Normocephalic and atraumatic.  Eyes: Conjunctivae and EOM are normal. Pupils are equal, round, and reactive to light.  Neck:  Limited by trach in place--air loss heard    Cardiovascular: Normal rate and regular rhythm.   No murmur heard. Respiratory: Effort normal. No stridor. She has decreased breath sounds in the right lower field and the left lower field. She has wheezes. She has rhonchi.  GI: Soft. Bowel sounds are normal. She exhibits no distension. There is tenderness.  Ostomy with semi-formed stool.   Musculoskeletal: She exhibits edema. She exhibits no tenderness.  Neurological: She is alert and oriented to person, place, and time.  Motor:  Bilateral upper extremities 4+/5 proximal to distal Bilateral lower extremities hip flexion, knee extension 4-/5, ankle dorsi/plantarflexion 4+/5 Sensation diminished to light touch bilateral feet   Skin: Skin is warm and dry.  Midline abdominal incision with retention sutures as well as dehiscence in between. ABD pad and gauze soaked with serous drainage. Surrounding skin macerated appearing.   Psychiatric: Her speech is normal. Her affect is  blunt.  Slightly agitated    Results for orders placed or performed during the hospital encounter of 03/28/16 (from the past 48 hour(s))  Glucose, capillary     Status: None   Collection Time: 04/30/16 12:08 PM  Result Value Ref Range   Glucose-Capillary 96 65 - 99 mg/dL   Comment 1 Notify RN    Comment 2 Document in Chart   Glucose, capillary     Status: None   Collection Time: 04/30/16  4:50 PM  Result Value Ref Range   Glucose-Capillary 86 65 - 99 mg/dL  Glucose, capillary     Status: Abnormal   Collection Time: 04/30/16  9:03 PM  Result Value Ref Range   Glucose-Capillary 103 (H) 65 - 99 mg/dL  Renal function panel     Status: Abnormal   Collection Time: 05/01/16  6:45 AM  Result Value Ref Range   Sodium 135 135 - 145 mmol/L   Potassium 3.9 3.5 - 5.1 mmol/L   Chloride 97 (L) 101 - 111 mmol/L   CO2 27 22 - 32 mmol/L   Glucose, Bld 88 65 - 99 mg/dL   BUN 10 6 - 20 mg/dL   Creatinine, Ser 0.97 0.44 - 1.00 mg/dL   Calcium 8.1 (L) 8.9 - 10.3 mg/dL    Phosphorus 4.0 2.5 - 4.6 mg/dL   Albumin 2.1 (L) 3.5 - 5.0 g/dL   GFR calc non Af Amer >60 >60 mL/min   GFR calc Af Amer >60 >60 mL/min    Comment: (NOTE) The eGFR has been calculated using the CKD EPI equation. This calculation has not been validated in all clinical situations. eGFR's persistently <60 mL/min signify possible Chronic Kidney Disease.    Anion gap 11 5 - 15  CBC with Differential/Platelet     Status: Abnormal   Collection Time: 05/01/16  6:45 AM  Result Value Ref Range   WBC 7.0 4.0 - 10.5 K/uL   RBC 2.84 (L) 3.87 - 5.11 MIL/uL   Hemoglobin 7.8 (L) 12.0 - 15.0 g/dL   HCT 26.7 (L) 36.0 - 46.0 %   MCV 94.0 78.0 - 100.0 fL   MCH 27.5 26.0 - 34.0 pg   MCHC 29.2 (L) 30.0 - 36.0 g/dL   RDW 17.6 (H) 11.5 - 15.5 %   Platelets 268 150 - 400 K/uL   Neutrophils Relative % 49 %   Neutro Abs 3.4 1.7 - 7.7 K/uL   Lymphocytes Relative 41 %   Lymphs Abs 2.9 0.7 - 4.0 K/uL   Monocytes Relative 7 %   Monocytes Absolute 0.5 0.1 - 1.0 K/uL   Eosinophils Relative 3 %   Eosinophils Absolute 0.2 0.0 - 0.7 K/uL   Basophils Relative 0 %   Basophils Absolute 0.0 0.0 - 0.1 K/uL  Glucose, capillary     Status: None   Collection Time: 05/01/16  8:20 AM  Result Value Ref Range   Glucose-Capillary 76 65 - 99 mg/dL   Comment 1 Notify RN    Comment 2 Document in Chart   Glucose, capillary     Status: None   Collection Time: 05/01/16 11:51 AM  Result Value Ref Range   Glucose-Capillary 90 65 - 99 mg/dL   Comment 1 Notify RN    Comment 2 Document in Chart   Glucose, capillary     Status: None   Collection Time: 05/01/16  5:38 PM  Result Value Ref Range   Glucose-Capillary 93 65 - 99 mg/dL   Comment 1 Notify  RN    Comment 2 Document in Chart   Glucose, capillary     Status: None   Collection Time: 05/01/16  9:07 PM  Result Value Ref Range   Glucose-Capillary 97 65 - 99 mg/dL  Renal function panel     Status: Abnormal   Collection Time: 05/02/16  3:00 AM  Result Value Ref Range    Sodium 136 135 - 145 mmol/L   Potassium 4.0 3.5 - 5.1 mmol/L   Chloride 101 101 - 111 mmol/L   CO2 28 22 - 32 mmol/L   Glucose, Bld 90 65 - 99 mg/dL   BUN 12 6 - 20 mg/dL   Creatinine, Ser 0.85 0.44 - 1.00 mg/dL   Calcium 8.1 (L) 8.9 - 10.3 mg/dL   Phosphorus 3.5 2.5 - 4.6 mg/dL   Albumin 2.0 (L) 3.5 - 5.0 g/dL   GFR calc non Af Amer >60 >60 mL/min   GFR calc Af Amer >60 >60 mL/min    Comment: (NOTE) The eGFR has been calculated using the CKD EPI equation. This calculation has not been validated in all clinical situations. eGFR's persistently <60 mL/min signify possible Chronic Kidney Disease.    Anion gap 7 5 - 15  CBC with Differential/Platelet     Status: Abnormal   Collection Time: 05/02/16  3:00 AM  Result Value Ref Range   WBC 8.0 4.0 - 10.5 K/uL   RBC 2.73 (L) 3.87 - 5.11 MIL/uL   Hemoglobin 7.6 (L) 12.0 - 15.0 g/dL   HCT 25.5 (L) 36.0 - 46.0 %   MCV 93.4 78.0 - 100.0 fL   MCH 27.8 26.0 - 34.0 pg   MCHC 29.8 (L) 30.0 - 36.0 g/dL   RDW 17.9 (H) 11.5 - 15.5 %   Platelets 264 150 - 400 K/uL   Neutrophils Relative % 48 %   Neutro Abs 3.9 1.7 - 7.7 K/uL   Lymphocytes Relative 40 %   Lymphs Abs 3.2 0.7 - 4.0 K/uL   Monocytes Relative 9 %   Monocytes Absolute 0.7 0.1 - 1.0 K/uL   Eosinophils Relative 3 %   Eosinophils Absolute 0.2 0.0 - 0.7 K/uL   Basophils Relative 0 %   Basophils Absolute 0.0 0.0 - 0.1 K/uL  Glucose, capillary     Status: None   Collection Time: 05/02/16  8:35 AM  Result Value Ref Range   Glucose-Capillary 83 65 - 99 mg/dL   Comment 1 Notify RN    Comment 2 Document in Chart    No results found.     Medical Problem List and Plan: 1.  Weakness, decreased endurance secondary to debility from multiple medical issues 2.  DVT Prophylaxis/Anticoagulation: Pharmaceutical: Lovenox 3. Pain Management: continue Fentanyl patch 100 mcg with oxycodone prn for pain 4. Mood:  LCSW to follow for evaluation and support.  5. Neuropsych: This patient is  capable of making decisions on her own behalf. 6. Skin/Wound Care: Dressing changes--increase to tid to help decrease maceration.  7. Fluids/Electrolytes/Nutrition: Monitor I/O. Check lytes in am. Renal status stable. Add supplements to help manage low protein stores. 8. ABLA:  H/H stable at 7.6-8.0 range. Check anemia panel---has history of B12 deficiency and may need IM supplement. .  9.  COPD/OSA in setting of morbid obesity:  Continue ATC- tolerating PMSV.  Continue brovana bid and Pulmicort bid.  10. Hypothyroid: On supplement.  11. Morbid Obesity  Body mass index is 50.2 kg/(m^2).  Diet and exercise education  Will cont to encourage  weight loss to increase endurance and promote overall health 12. Tobacco abuse:  Continue counseling 13. Bilateral knee pain  Will continue to monitor and adjust medications as necessary 14. Hypothyroidism  Continue medications  Post Admission Physician Evaluation: 1. Functional deficits secondary  to debility from multiple medical issues. 2. Patient is admitted to receive collaborative, interdisciplinary care between the physiatrist, rehab nursing staff, and therapy team. 3. Patient's level of medical complexity and substantial therapy needs in context of that medical necessity cannot be provided at a lesser intensity of care such as a SNF. 4. Patient has experienced substantial functional loss from his/her baseline which was documented above under the "Functional History" and "Functional Status" headings.  Judging by the patient's diagnosis, physical exam, and functional history, the patient has potential for functional progress which will result in measurable gains while on inpatient rehab.  These gains will be of substantial and practical use upon discharge  in facilitating mobility and self-care at the household level. 5. Physiatrist will provide 24 hour management of medical needs as well as oversight of the therapy plan/treatment and provide guidance as  appropriate regarding the interaction of the two. 6. 24 hour rehab nursing will assist with bladder management, bowel management, safety, skin/wound care, disease management and patient education and help integrate therapy concepts, techniques,education, etc. 7. PT will assess and treat for/with: Lower extremity strength, range of motion, stamina, balance, functional mobility, safety, adaptive techniques and equipment, woundcare, coping skills, pain control, education.   Goals are: Min A/Supervision. 8. OT will assess and treat for/with: ADL's, functional mobility, safety, upper extremity strength, adaptive techniques and equipment, wound mgt, ego support, and community reintegration.   Goals are: Min A/Supervision. Therapy may not proceed with showering this patient. 9. Case Management and Social Worker will assess and treat for psychological issues and discharge planning. 10. Team conference will be held weekly to assess progress toward goals and to determine barriers to discharge. 11. Patient will receive at least 3 hours of therapy per day at least 5 days per week. 12. ELOS: 15-19 days. 13. Prognosis:  good  Delice Lesch, MD 05/02/2016

## 2016-05-02 NOTE — Progress Notes (Signed)
Retta Diones, RN Rehab Admission Coordinator Signed Physical Medicine and Rehabilitation PMR Pre-admission 05/02/2016 11:52 AM  Related encounter: ED to Hosp-Admission (Current) from 03/28/2016 in [place] Collapse All   PMR Admission Coordinator Pre-Admission Assessment  Patient: Christina Castaneda is an 60 y.o., female MRN: [id number] DOB: 1956/04/18 Height: _0  (167.6 cm) Weight: (!) 141 kg (310 lb 13.6 oz)  Insurance Information HMO: PPO: PCP: IPA: 80/20: OTHER: Group # [id number] PRIMARY: BCBS of  Policy#: [id number] Subscriber: [place] Name: Laruth Bouchard Phone#: [phone] Fax#: [phone] Pre-Cert#: [id number] for 2 weeks 05/10 to 05/15/16 with update due 05/15/16 Employer: Truck Driver FT Benefits: Phone #: [phone] Name: Automated Eff. Date: 01/25/16 Deduct: $3500 (met $3500) Out of Pocket Max: [phone] (met [phone]) Life Max: unlimited CIR: 70% SNF: 70% with 60 days max Outpatient: 30 visit limit Co-Pay: $25 copay Home Health: 70% Co-Pay: 30% DME: 50% Co-Pay: 50% Providers: in network  Medicaid Application Date: Case Manager:  Disability Application Date: Case Worker:   Emergency Facilities manager Information    Name Relation Home Work [place] Spouse [phone]  [phone]     Current Medical History  Patient Admitting Diagnosis:Debilitation/multi-medical   History of Present Illness: A 60 year old female with history of COPD with OSA, morbid obesity, vitamin B 12 deficiency, perforated diverticulitis s/p sigmoid colectomy with colostomy in [place], [place] on 03/20 who was discharged to home in [place], [place]. At follow up  appointment for staple removal, she was found to have feculent discharge and was admitted via ED on 03/28/16. 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 Hulen Skains on 04/05. Abdomen kept open with wound VAC required multiple procedures including ABRA device to help with final wound closure on 04/18/16. She continues to have retention sutures as well as evidence of breakdown and wet to dry dressing to prior ostomy site. Septic shock resolved and she required tracheostomy due to inability to tolerate vent wean. She is tolerating PMSV And was started on regular diet on 05/04. CCM recommends leaving cuffed #6 in place until ready for decannulation--they will follow weekly for input. Cortrack removed on 05/05 and patient encouraged to increase po intake to help with healing. Therapy ongoing and patient noted to be severely deconditioned and working on pre-gait activity. CIR recommended by rehab team for follow up therapy.    Past Medical History  Past Medical History  Diagnosis Date  . Goiter   . Hypothyroidism   . Asthma     mild  . Hypertension   . Atypical endometrial hyperplasia   . Heart murmur   . Vitamin B12 deficiency   . Yeast infection     for last 3 weeks  . Gout   . COPD (chronic obstructive pulmonary disease) (HCC)     Family History  family history includes COPD in her mother; Congestive Heart Failure in her mother; Diabetes in her father and mother; Heart attack in her father.  Prior Rehab/Hospitalizations: No previous rehab  Has the patient had major surgery during 100 days prior to admission? No  Current Medications   Current facility-administered medications:  . 0.9 % sodium chloride infusion, , Intravenous, Continuous, Laverle Hobby, MD, Last Rate: 10  mL/hr at 05/01/16 0800 . 0.9 % sodium chloride infusion, , Intravenous, Once,  Colbert Coyer, MD, Last Rate: 10 mL/hr at 04/26/16 1000 . acetaminophen (TYLENOL) solution 650 mg, 650 mg, Oral, Q6H PRN, Erroll Luna, MD, 650 mg at 05/01/16 1347 . albuterol (PROVENTIL) (2.5 MG/3ML) 0.083% nebulizer solution 2.5 mg, 2.5 mg, Nebulization, Q4H PRN, Judeth Horn, MD, 2.5 mg at 04/23/16 1834 . antiseptic oral rinse (CPC / CETYLPYRIDINIUM CHLORIDE 0.05%) solution 7 mL, 7 mL, Mouth Rinse, q12n4p, Greer Pickerel, MD, 7 mL at 05/01/16 1537 . arformoterol (BROVANA) nebulizer solution 15 mcg, 15 mcg, Nebulization, BID, Chesley Mires, MD, 15 mcg at 05/02/16 0737 . budesonide (PULMICORT) nebulizer solution 0.5 mg, 0.5 mg, Nebulization, BID, Donita Brooks, NP, 0.5 mg at 05/02/16 0737 . chlorhexidine gluconate (SAGE KIT) (PERIDEX) 0.12 % solution 15 mL, 15 mL, Mouth Rinse, BID, Judeth Horn, MD, 15 mL at 05/02/16 0800 . enoxaparin (LOVENOX) injection 40 mg, 40 mg, Subcutaneous, Q24H, Judeth Horn, MD, 40 mg at 05/01/16 2334 . feeding supplement (ENSURE ENLIVE) (ENSURE ENLIVE) liquid 237 mL, 237 mL, Oral, BID BM, Raylene Miyamoto, MD, 237 mL at 05/01/16 2336 . fentaNYL (DURAGESIC - dosed mcg/hr) 100 mcg, 100 mcg, Transdermal, Q72H, Rush Farmer, MD, 100 mcg at 05/01/16 1114 . iopamidol (ISOVUE-300) 61 % injection 50 mL, 50 mL, Other, Once PRN, Greer Pickerel, MD . levothyroxine (SYNTHROID, LEVOTHROID) tablet 200 mcg, 200 mcg, Oral, QAC breakfast, Donita Brooks, NP, 200 mcg at 05/02/16 1038 . multivitamin with minerals tablet 1 tablet, 1 tablet, Oral, Daily, Raylene Miyamoto, MD, 1 tablet at 05/02/16 [phone] . [DISCONTINUED] ondansetron (ZOFRAN-ODT) disintegrating tablet 4 mg, 4 mg, Oral, Q6H PRN **OR** ondansetron (ZOFRAN) injection 4 mg, 4 mg, Intravenous, Q6H PRN, Judeth Horn, MD, 4 mg at 04/18/16 2300 . oxyCODONE (Oxy IR/ROXICODONE) immediate release tablet 10-15 mg, 10-15 mg, Oral, Q4H PRN, Erroll Luna, MD, 10 mg at 05/02/16 1038 . pantoprazole (PROTONIX) EC tablet 40 mg, 40 mg, Oral, Daily, Erroll Luna, MD, 40 mg at 05/02/16 0849 . sodium chloride flush (NS) 0.9 % injection 10-40 mL, 10-40 mL, Intracatheter, Q12H, Collene Gobble, MD, 30 mL at 05/02/16 1000 . 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  Home Assistive Devices / Equipment Home Assistive Devices/Equipment: None Home Equipment: Walker - 2 wheels, Bedside commode  Prior Device Use: Indicate devices/aids used by the patient prior to current illness, exacerbation or injury? None  Prior Functional Level Prior Function Level of Independence: Independent  Self Care: Did the patient need help bathing, dressing, using the toilet or eating? Independent  Indoor Mobility: Did the patient need assistance with walking from room to room (with or without device)? Independent  Stairs: Did the patient need assistance with internal or external stairs (with or without device)? Independent  Functional Cognition: Did the patient need help planning regular tasks such as shopping or remembering to take medications? Independent  Current Functional Level Cognition  Overall Cognitive Status: Within Functional Limits for tasks assessed Orientation Level: Oriented X4   Extremity Assessment (includes Sensation/Coordination)  Upper Extremity Assessment: Generalized weakness  Lower Extremity Assessment: Defer to PT evaluation RLE  Deficits  / Details: grossly 3-/5 LLE Deficits / Details: grossly 3-/5    ADLs  Overall ADL's : Needs assistance/impaired Eating/Feeding: NPO Grooming: Wash/dry hands, Oral care, Sitting, Minimal assistance Upper Body Bathing: Moderate assistance, Sitting Upper Body Bathing Details (indicate cue type and reason): washed pt's back while in sitting Lower Body Bathing: Total assistance, Bed level Upper Body Dressing : Bed level, Moderate assistance Lower Body Dressing: Total assistance, Bed level Lower Body Dressing Details (indicate cue type and reason): socks Toileting- Clothing Manipulation and Hygiene: Total assistance, Bed level    Mobility  Overal bed mobility: Needs Assistance, +2 for physical assistance Bed Mobility: Sit to Supine Rolling: Min assist Sidelying to sit: Mod assist Sit to supine: Mod assist, +2 for physical assistance Sit to sidelying: Mod assist, +2 for physical assistance General bed mobility comments: rolling in bed for using bedpan and hygiene as well as donning abdominal binder prior to assist to elevate trunk with +2 for safety    Transfers  Overall transfer level: Needs assistance Equipment used: Ambulation equipment used Clarise Cruz Plus) Transfer via Lift Equipment: Marketing executive Transfers: Sit to/from Stand Sit to Stand: +2 physical assistance, Mod assist Stand pivot transfers: Mod assist, +2 physical assistance General transfer comment: assist to stand with Clarise Cruz Plus, but pt unable to come up all the way to standing due to fatigued after dressing changes and having to switch out [employer] due to not working, but attempted x 3 with ability to lift hips from EOB x 3 attempts, but not to come all the way up.     Ambulation / Gait / Stairs / Wheelchair Mobility  Ambulation/Gait General Gait Details: Not ready yet    Posture / Balance Dynamic Sitting Balance Sitting balance - Comments: UE support for sitting EOB but no physical help needed as in [employer] much of session  Balance Overall balance assessment: Needs assistance Sitting-balance support: Feet supported, Single extremity supported Sitting balance-Leahy Scale: Poor Sitting balance - Comments: UE support for sitting EOB but no physical help needed as in [employer]  much of session  Postural control: Posterior lean Standing balance support: Bilateral upper extremity supported, During functional activity Standing balance-Leahy Scale: Poor Standing balance comment: relies on UE support and Clarise Cruz plus for standing as well as +2 assist.     Special needs/care consideration BiPAP/CPAP yes, has CPAP CPM No Continuous Drip IV KVO Dialysis No  Life Vest No Oxygen Trach collar Special Bed: Currently on a low air loss bed Trach Size Yes, #6 cuffless Wound Vac (area) No  Skin: Abdominal incision  Bowel mgmt: Colostomy Bladder mgmt: Voiding on bedpan with increased frequency Diabetic mgmt No    Previous Home Environment Living Arrangements: Spouse/significant other Available Help at Discharge: Family, Available PRN/intermittently Type of Home: House Home Layout: Two level, Laundry or work area in basement, Able to live on main level with bedroom/bathroom Home Access: Stairs to enter Entrance Stairs-Rails: Right Entrance Stairs-Number of Steps: 2 Bathroom Shower/Tub: Chiropodist: Standard Home Care Services: No  Discharge Living Setting Plans for Discharge Living Setting: Patient's home, House, Lives with (comment) (Lives with husband.) Type of Home at Discharge: House Discharge Home Layout: Two level, Laundry or work area in basement, Able to live on main level with bedroom/bathroom Alternate Level Stairs-Number of Steps: Flight down to basement Discharge Home Access: Stairs to enter [employer] of Steps: 2 at [place] entry Does the patient have any problems obtaining your medications?:  No  Social/Family/Support Systems Patient Roles: Spouse (Has a husband.) Contact Information: Haydee Salter - spouse Anticipated Caregiver: husband Anticipated Caregiver's Contact Information: Tommie Raymond - husband - [phone] Ability/Limitations of Caregiver: Husband works FT as Administrator from lunch time on. Caregiver Availability: Intermittent (Husband may have to arrange for caregiver.) Discharge Plan Discussed with Primary Caregiver: Yes Is Caregiver In Agreement with Plan?: Yes Does Caregiver/Family have Issues with Lodging/Transportation while Pt is in Rehab?: No  Goals/Additional Needs Patient/Family Goal for Rehab: PT/OT supervision and min assist, ST mod I and I goals Expected length of stay: 20-24 days Cultural Considerations: None Dietary Needs: Regular diet, thin liquids Equipment Needs: TBD Pt/Family Agrees to Admission and willing to participate: Yes Program Orientation Provided & Reviewed with Pt/Caregiver Including Roles & Responsibilities: Yes  Decrease burden of Care through IP rehab admission: N/A  Possible need for SNF placement upon discharge: Yes, if patient does not progress well and if husband cannot arrange care at home after discharge.  Patient Condition: This patient's medical and functional status has changed since the consult dated: 04/25/16 in which the Rehabilitation Physician determined and documented that the patient's condition is appropriate for intensive rehabilitative care in an inpatient rehabilitation facility. See "History of Present Illness" (above) for medical update. Functional changes are: Currently requiring mod assist for transfers, no ambulation yet. Patient's medical and functional status update has been discussed with the Rehabilitation physician and patient remains appropriate for inpatient rehabilitation. Will admit to inpatient rehab today.  Preadmission Screen Completed By: Retta Diones, 05/02/2016 12:00  PM ______________________________________________________________________  Discussed status with Dr. Posey Pronto on 05/02/16 at 52 and received telephone approval for admission today.  Admission Coordinator: Retta Diones, time1210/Date05/10/17          Cosigned by: Ankit Lorie Phenix, MD at 05/02/2016 12:34 PM  Revision History     Date/Time User Provider Type Action   05/02/2016 12:34 PM Ankit Lorie Phenix, MD Physician Cosign   05/02/2016 12:11 PM Retta Diones, RN Rehab Admission Coordinator Sign

## 2016-05-02 NOTE — Consult Note (Signed)
WOC ostomy follow up Stoma type/location:  RLQ, end colosotmy Stomal assessment/size: 1 3/4" x 1 3/8" oval shaped stoma Peristomal assessment: pouch intact today Treatment options for stomal/peristomal skin: using 2" skin barrier ring  Output pasty, brown output Ostomy pouching: 1pc. Education provided: none today.  Enrolled patient in [place] Start Discharge program: Yes  Bedside nurse to change wound packing and foam dressings to ABRA and retention rod sites today.  WOC will follow along with you for continued support with ostomy teaching and care [facility] RN,CWOCN [id number]

## 2016-05-02 NOTE — PMR Pre-admission (Signed)
PMR Admission Coordinator Pre-Admission Assessment  Patient: Christina Castaneda is an 60 y.o., female MRN: [id number] DOB: 1956/11/10 Height: '5\' 6"'  (167.6 cm) Weight: (!) 141 kg (310 lb 13.6 oz)              Insurance Information HMO:      PPO:       PCP:       IPA:       80/20:       OTHER:  Group # [id number] PRIMARY: [employer] [place]      Policy#: [id number]      Subscriber: [place] Name: Laruth Bouchard      Phone#: [phone]     Fax#: [phone] Pre-Cert#: [id number] for 2 weeks 05/10 to 05/15/16 with update due 05/15/16      Employer: [employer] FT Benefits:  Phone #: [phone]     Name: Automated Eff. Date: 01/25/16     Deduct: $3500 (met $3500)      Out of Pocket Max: [phone] (met [phone])      Life Max: unlimited CIR: 70%      SNF: 70% with 60 days max Outpatient: 30 visit limit     Co-Pay: $25 copay Home Health: 70%      Co-Pay: 30% DME: 50%     Co-Pay: 50%  Providers: in network  Medicaid Application Date:        Case Manager:   Disability Application Date:        Case Worker:    Emergency Facilities manager Information    Name Relation Home Work [place] Spouse [phone]  [phone]     Current Medical History  Patient Admitting Diagnosis:Debilitation/multi-medical    History of Present Illness: A 60 year old female with history of COPD with OSA, morbid obesity, vitamin B 12 deficiency, perforated diverticulitis s/p sigmoid colectomy with colostomy in [place], [place] on 03/20 who was discharged to home in [place], [place]. At follow up appointment for staple removal, she was found to have feculent discharge and was admitted via ED on 03/28/16. 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 Hulen Skains on 04/05. Abdomen kept open with wound VAC required multiple procedures including ABRA device to  help with final wound closure on 04/18/16. She continues to have retention sutures as well as evidence of breakdown and wet to dry dressing to prior ostomy site. Septic shock resolved and she required tracheostomy due to inability to tolerate vent wean. She is tolerating PMSV And was started on regular diet on 05/04. CCM recommends leaving cuffed #6 in place until ready for decannulation--they will follow weekly for input. Cortrack removed on 05/05 and patient encouraged to increase po intake to help with healing. Therapy ongoing and patient noted to be severely deconditioned and working on pre-gait activity. CIR recommended by rehab team for follow up therapy.    Past Medical History  Past Medical History  Diagnosis Date  . Goiter   . Hypothyroidism   . Asthma     mild  . Hypertension   . Atypical endometrial hyperplasia   . Heart murmur   . Vitamin B12 deficiency   . Yeast infection     for last 3 weeks  . Gout   . COPD (chronic obstructive pulmonary disease) (HCC)     Family History  family history includes  COPD in her mother; Congestive Heart Failure in her mother; Diabetes in her father and mother; Heart attack in her father.  Prior Rehab/Hospitalizations: No previous rehab  Has the patient had major surgery during 100 days prior to admission? No  Current Medications   Current facility-administered medications:  .  0.9 %  sodium chloride infusion, , Intravenous, Continuous, Laverle Hobby, MD, Last Rate: 10 mL/hr at 05/01/16 0800 .  0.9 %  sodium chloride infusion, , Intravenous, Once, Colbert Coyer, MD, Last Rate: 10 mL/hr at 04/26/16 1000 .  acetaminophen (TYLENOL) solution 650 mg, 650 mg, Oral, Q6H PRN, Erroll Luna, MD, 650 mg at 05/01/16 1347 .  albuterol (PROVENTIL) (2.5 MG/3ML) 0.083% nebulizer solution 2.5 mg, 2.5 mg, Nebulization, Q4H PRN, Judeth Horn, MD, 2.5 mg at 04/23/16 1834 .  antiseptic oral rinse (CPC / CETYLPYRIDINIUM CHLORIDE 0.05%)  solution 7 mL, 7 mL, Mouth Rinse, q12n4p, Greer Pickerel, MD, 7 mL at 05/01/16 1537 .  arformoterol (BROVANA) nebulizer solution 15 mcg, 15 mcg, Nebulization, BID, Chesley Mires, MD, 15 mcg at 05/02/16 0737 .  budesonide (PULMICORT) nebulizer solution 0.5 mg, 0.5 mg, Nebulization, BID, Donita Brooks, NP, 0.5 mg at 05/02/16 0737 .  chlorhexidine gluconate (SAGE KIT) (PERIDEX) 0.12 % solution 15 mL, 15 mL, Mouth Rinse, BID, Judeth Horn, MD, 15 mL at 05/02/16 0800 .  enoxaparin (LOVENOX) injection 40 mg, 40 mg, Subcutaneous, Q24H, Judeth Horn, MD, 40 mg at 05/01/16 2334 .  feeding supplement (ENSURE ENLIVE) (ENSURE ENLIVE) liquid 237 mL, 237 mL, Oral, BID BM, Raylene Miyamoto, MD, 237 mL at 05/01/16 2336 .  fentaNYL (DURAGESIC - dosed mcg/hr) 100 mcg, 100 mcg, Transdermal, Q72H, Rush Farmer, MD, 100 mcg at 05/01/16 1114 .  iopamidol (ISOVUE-300) 61 % injection 50 mL, 50 mL, Other, Once PRN, Greer Pickerel, MD .  levothyroxine (SYNTHROID, LEVOTHROID) tablet 200 mcg, 200 mcg, Oral, QAC breakfast, Donita Brooks, NP, 200 mcg at 05/02/16 1038 .  multivitamin with minerals tablet 1 tablet, 1 tablet, Oral, Daily, Raylene Miyamoto, MD, 1 tablet at 05/02/16 [phone] .  [DISCONTINUED] ondansetron (ZOFRAN-ODT) disintegrating tablet 4 mg, 4 mg, Oral, Q6H PRN **OR** ondansetron (ZOFRAN) injection 4 mg, 4 mg, Intravenous, Q6H PRN, Judeth Horn, MD, 4 mg at 04/18/16 2300 .  oxyCODONE (Oxy IR/ROXICODONE) immediate release tablet 10-15 mg, 10-15 mg, Oral, Q4H PRN, Erroll Luna, MD, 10 mg at 05/02/16 1038 .  pantoprazole (PROTONIX) EC tablet 40 mg, 40 mg, Oral, Daily, Erroll Luna, MD, 40 mg at 05/02/16 0849 .  sodium chloride flush (NS) 0.9 % injection 10-40 mL, 10-40 mL, Intracatheter, Q12H, Collene Gobble, MD, 30 mL at 05/02/16 1000 .  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  Home Assistive Devices / Equipment Home Assistive Devices/Equipment: None Home Equipment: Walker - 2 wheels, Bedside commode  Prior Device Use: Indicate devices/aids used by the patient prior to current illness, exacerbation or injury? None  Prior Functional Level Prior Function Level of Independence: Independent  Self Care: Did the patient need help bathing, dressing, using the  toilet or eating?  Independent  Indoor Mobility: Did the patient need assistance with walking from room to room (with or without device)? Independent  Stairs: Did the patient need assistance with internal or external stairs (with or without device)? Independent  Functional Cognition: Did the patient need help planning regular tasks such as shopping or remembering to take medications? Independent  Current Functional Level Cognition  Overall Cognitive Status: Within Functional Limits for tasks assessed Orientation Level: Oriented X4    Extremity Assessment (includes Sensation/Coordination)  Upper Extremity Assessment: Generalized weakness  Lower Extremity Assessment: Defer to PT evaluation RLE Deficits / Details: grossly 3-/5 LLE Deficits / Details: grossly 3-/5    ADLs  Overall ADL's : Needs assistance/impaired Eating/Feeding: NPO Grooming: Wash/dry hands, Oral care, Sitting, Minimal assistance Upper Body Bathing: Moderate assistance, Sitting Upper Body Bathing Details (indicate cue type and reason): washed pt's back while in sitting Lower Body Bathing: Total assistance, Bed level Upper Body Dressing : Bed level, Moderate assistance Lower Body  Dressing: Total assistance, Bed level Lower Body Dressing Details (indicate cue type and reason): socks Toileting- Clothing Manipulation and Hygiene: Total assistance, Bed level    Mobility  Overal bed mobility: Needs Assistance, +2 for physical assistance Bed Mobility: Sit to Supine Rolling: Min assist Sidelying to sit: Mod assist Sit to supine: Mod assist, +2 for physical assistance Sit to sidelying: Mod assist, +2 for physical assistance General bed mobility comments: rolling in bed for using bedpan and hygiene as well as donning abdominal binder prior to assist to elevate trunk with +2 for safety    Transfers  Overall transfer level: Needs assistance Equipment used: Ambulation equipment used Clarise Cruz Plus) Transfer via Lift Equipment: Marketing executive Transfers: Sit to/from Stand Sit to Stand: +2 physical assistance, Mod assist Stand pivot transfers: Mod assist, +2 physical assistance General transfer comment: assist to stand with Clarise Cruz Plus, but pt unable to come up all the way to standing due to fatigued after dressing changes and having to switch out [employer] due to not working, but attempted x 3 with ability to lift hips from EOB x 3 attempts, but not to come all the way up.     Ambulation / Gait / Stairs / Wheelchair Mobility  Ambulation/Gait General Gait Details: Not ready yet    Posture / Balance Dynamic Sitting Balance Sitting balance - Comments: UE support for sitting EOB but no physical help needed as in [employer]  much of session  Balance Overall balance assessment: Needs assistance Sitting-balance support: Feet supported, Single extremity supported Sitting balance-Leahy Scale: Poor Sitting balance - Comments: UE support for sitting EOB but no physical help needed as in [employer]  much of session  Postural control: Posterior lean Standing balance support: Bilateral upper extremity supported, During functional activity Standing balance-Leahy Scale: Poor Standing balance  comment: relies on UE support and Clarise Cruz plus for standing as well as +2 assist.     Special needs/care consideration BiPAP/CPAP yes, has CPAP CPM No Continuous Drip IV KVO Dialysis No         Life Vest No Oxygen Trach collar Special Bed:  Currently on a low air loss bed Trach Size Yes, #6 cuffless Wound Vac (area) No      Skin:  Abdominal incision                              Bowel mgmt: Colostomy Bladder mgmt: Voiding on bedpan with increased frequency Diabetic  mgmt No    Previous Home Environment Living Arrangements: Spouse/significant other Available Help at Discharge: Family, Available PRN/intermittently Type of Home: House Home Layout: Two level, Laundry or work area in basement, Able to live on main level with bedroom/bathroom Home Access: Stairs to enter Entrance Stairs-Rails: Right Entrance Stairs-Number of Steps: 2 Bathroom Shower/Tub: Chiropodist: [place]: No  Discharge Living Setting Plans for Discharge Living Setting: Patient's home, House, Lives with (comment) (Lives with husband.) Type of Home at Discharge: House Discharge [employer]: Two level, Laundry or work area in basement, Able to live on main level with bedroom/bathroom Alternate Level Stairs-Number of Steps: Flight down to basement Discharge Home Access: Stairs to enter Technical brewer of Steps: 2 at [place] entry Does the patient have any problems obtaining your medications?: No  Social/Family/Support Systems Patient Roles: Spouse (Has a husband.) Contact Information: Haydee Salter - spouse Anticipated Caregiver: husband Anticipated Caregiver's Contact Information: Tommie Raymond - husband - [phone] Ability/Limitations of Caregiver: Husband works FT as Administrator from lunch time on. Caregiver Availability: Intermittent (Husband may have to arrange for caregiver.) Discharge Plan Discussed with Primary Caregiver: Yes Is Caregiver In Agreement with  Plan?: Yes Does Caregiver/Family have Issues with Lodging/Transportation while Pt is in Rehab?: No  Goals/Additional Needs Patient/Family Goal for Rehab: PT/OT supervision and min assist, ST mod I and I goals Expected length of stay: 20-24 days Cultural Considerations: None Dietary Needs: Regular diet, thin liquids Equipment Needs: TBD Pt/Family Agrees to Admission and willing to participate: Yes Program Orientation Provided & Reviewed with Pt/Caregiver Including Roles  & Responsibilities: Yes  Decrease burden of Care through IP rehab admission: N/A  Possible need for SNF placement upon discharge: Yes, if patient does not progress well and if husband cannot arrange care at home after discharge.  Patient Condition: This patient's medical and functional status has changed since the consult dated: 04/25/16 in which the Rehabilitation Physician determined and documented that the patient's condition is appropriate for intensive rehabilitative care in an inpatient rehabilitation facility. See "History of Present Illness" (above) for medical update. Functional changes are: Currently requiring mod assist for transfers, no ambulation yet. Patient's medical and functional status update has been discussed with the Rehabilitation physician and patient remains appropriate for inpatient rehabilitation. Will admit to inpatient rehab today.  Preadmission Screen Completed By:  Retta Diones, 05/02/2016 12:00 PM ______________________________________________________________________   Discussed status with Dr.  Posey Pronto on 05/02/16 at 58 and received telephone approval for admission today.  Admission Coordinator:  Retta Diones, time1210/Date05/10/17

## 2016-05-02 NOTE — Progress Notes (Signed)
Ankit Lorie Phenix, MD Physician Signed Physical Medicine and Rehabilitation Consult Note 04/25/2016 11:12 AM  Related encounter: ED to Hosp-Admission (Current) from 03/28/2016 in [place] Collapse All        Physical Medicine and Rehabilitation Consult Reason for Consult: Debilitation/multi-medical Referring Physician: CCS   HPI: Christina Castaneda is a 60 y.o. right handed female with history of COPD, tobacco abuse and hypertension. Patient lives with spouse independent prior to admission also sedentary due to bilateral knee pain. Two-level home with bedroom on main floor. Patient with complicated history. Patient is a long distance truck driver and while driving to [place] in March when she developed severe abdominal pain went to a nearby hospital diagnosed with perforated diverticulitis. She underwent exploratory laparotomy with sigmoid colon resection/colostomy on March 20. She was later discharged from that hospital returned to home in [facility]. Developed leakage of fecal matter from her midline surgical incision site as well as persistent nausea and generalized abdominal pain. Noted low-grade fever. Admitted to [facility] on 03/28/2016 and underwent CT abdomen and pelvis showed no bowel obstruction or intra-abdominal abscess. Air with ill-defined fluid in the subjacent subcutaneous tissues. Underwent colostomy removal and in stapling stoma, partial colectomy exploratory laparotomy omentectomy and debridement of wound 03/29/2016. A wound VAC was applied and later underwent placement ABRA abdominal closure set of open abdomen 04/04/2016 as well as reexploration of abdomen and finally with removal of ABRA device and closure of abdominal wound 04/18/2016. Hospital course complicated by acute respiratory failure with vent management and patient had undergone tracheosto (my 04/05/2016 and changed to a #6, cuffless trach 04/24/2016 per pulmonary  services. Currently on a dysphagia 2 thin liquid diet. Subcutaneous Lovenox for DVT prophylaxis. Physical occupational therapy evaluations completed with recommendations of physical medicine rehabilitation consult.   Review of Systems  Constitutional: Positive for fever. Negative for chills.  HENT: Negative for hearing loss.  Eyes: Negative for blurred vision and double vision.  Respiratory: Positive for cough and shortness of breath.  Cardiovascular: Positive for leg swelling. Negative for chest pain and palpitations.  Gastrointestinal: Positive for nausea, vomiting, abdominal pain and constipation.  Genitourinary: Negative for dysuria and hematuria.  Musculoskeletal: Positive for myalgias and joint pain.  Skin: Negative for rash.  Neurological: Positive for weakness. Negative for seizures and headaches.  All other systems reviewed and are negative.  Past Medical History  Diagnosis Date  . Goiter   . Hypothyroidism   . Asthma     mild  . Hypertension   . Atypical endometrial hyperplasia   . Heart murmur   . Vitamin B12 deficiency   . Yeast infection     for last 3 weeks  . Gout   . COPD (chronic obstructive pulmonary disease) [facility])    Past Surgical History  Procedure Laterality Date  . Neck surgery  1994    repair disk  . Tonsillectomy      as a child  . Cholecystectomy  2009    gallstone removed  . Robotic assisted total hysterectomy with bilateral salpingo oopherectomy  11/18/2012    Procedure: ROBOTIC ASSISTED TOTAL HYSTERECTOMY WITH BILATERAL SALPINGO OOPHORECTOMY; Surgeon: Imagene Gurney A. Alycia Rossetti, MD; Location: [facility]; Service: Gynecology; Laterality: N/A;  . Colostomy revision N/A 03/28/2016    Procedure: COLOSTOMY REVISION; Surgeon: Judeth Horn, MD; Location: [place]; Service: General; Laterality: N/A;  . Laparotomy N/A 03/28/2016    Procedure: EXPLORATORY LAPAROTOMY; Surgeon: Judeth Horn, MD; Location:  [place] OR; Service: General; Laterality: N/A;  . Omentectomy N/A 03/28/2016    Procedure: OMENTECTOMY; Surgeon: Judeth Horn, MD; Location: [place]; Service: General; Laterality: N/A;  . Wound debridement N/A 03/28/2016    Procedure: DEBRIDEMENT WOUND; Surgeon: Judeth Horn, MD; Location: [place]; Service: General; Laterality: N/A;  . Vacuum assisted closure change N/A 03/30/2016    Procedure: ABDOMINAL VAC CHANGE; Surgeon: Rolm Bookbinder, MD; Location: [place]; Service: General; Laterality: N/A;  . Colostomy N/A 03/30/2016    Procedure: COLOSTOMY; Surgeon: Rolm Bookbinder, MD; Location: [place]; Service: General; Laterality: N/A;  . Colostomy revision N/A 03/30/2016    Procedure: COLON RESECTION LEFT; Surgeon: Rolm Bookbinder, MD; Location: [place]; Service: General; Laterality: N/A;  . Laparotomy N/A 04/02/2016    Procedure: Re-exploration of open abdomen, application of abdominal wound vac; Surgeon: Greer Pickerel, MD; Location: [place]; Service: General; Laterality: N/A;  . Application of wound vac N/A 04/02/2016    Procedure: application of wound vac+; Surgeon: Greer Pickerel, MD; Location: [place]; Service: General; Laterality: N/A;  . Laparotomy N/A 04/04/2016    Procedure: EXPLORATORY LAPAROTOMY, PLACEMENT OF ABRA ABDOMINAL WALL CLOSURE SET ; Surgeon: Greer Pickerel, MD; Location: [place]; Service: General; Laterality: N/A;  . Laparotomy N/A 04/18/2016    Procedure: EXPLORATORY LAPAROTOMY; Surgeon: Erroll Luna, MD; Location: [place]; Service: General; Laterality: N/A;  . Abdominal wall defect repair N/A 04/18/2016    Procedure: CLOSURE OF ABDOMEN; Surgeon: Erroll Luna, MD; Location: [facility] OR; Service: General; Laterality: N/A;   Family History  Problem Relation Age of Onset  . COPD Mother   . Diabetes Mother   . Congestive Heart Failure Mother   . Diabetes Father     . Heart attack Father    Social History:  reports that she has been smoking Cigarettes. She has a 28 pack-year smoking history. She has never used smokeless tobacco. She reports that she drinks about 0.5 - 1.5 oz of alcohol per week. She reports that she does not use illicit drugs. Allergies:  Allergies  Allergen Reactions  . Penicillins Anaphylaxis, Hives, Shortness Of Breath, Swelling and Other (See Comments)    Angioedema   Medications Prior to Admission  Medication Sig Dispense Refill  . ADVAIR DISKUS 250-50 MCG/DOSE AEPB Once in the am & once at pm    . albuterol (PROVENTIL HFA;VENTOLIN HFA) 108 (90 BASE) MCG/ACT inhaler Inhale 2 puffs into the lungs every 6 (six) hours as needed. Wheezing and shortness of breath    . ergocalciferol (VITAMIN D2) 50000 UNITS capsule Take 50,000 Units by mouth once a week.    . Febuxostat (ULORIC) 80 MG TABS Take 80 mg by mouth daily before breakfast.     . levothyroxine (SYNTHROID, LEVOTHROID) 200 MCG tablet Take 200 mcg by mouth daily before breakfast.     . lisinopril (PRINIVIL,ZESTRIL) 20 MG tablet Take 20 mg by mouth daily.    Marland Kitchen nystatin cream (MYCOSTATIN) Apply topically 2 (two) times daily. For yeast infection      Home: Home Living Family/patient expects to be discharged to:: Private residence Living Arrangements: Spouse/significant other Available Help at Discharge: Family, Available PRN/intermittently Type of Home: House Home Access: Stairs to enter Technical brewer of Steps: 2 Entrance Stairs-Rails: Right Home Layout: Two level, Laundry or work area in basement, Able to live on main level with bedroom/bathroom Bathroom Shower/Tub: Chiropodist: Standard Home Equipment: Environmental consultant - 2 wheels, Bedside commode  Functional History: Prior Function Level of Independence: Independent Functional Status:  Mobility: Bed Mobility  Overal bed mobility: +2 for  physical assistance, Needs Assistance Bed Mobility: Rolling, Sidelying to Sit, Sit to Sidelying Rolling: Mod assist, +2 for safety/equipment Sidelying to sit: Mod assist, +2 for physical assistance Sit to sidelying: Mod assist, +2 for physical assistance General bed mobility comments: Assist for legs and to elevate trunk. Assist to lower trunk and to bring legs back up into bed. Pt able to assist with UEs and LEs with repositioning up in bed with bed inverted. Transfers General transfer comment: deferred this visit      ADL: ADL Overall ADL's : Needs assistance/impaired Eating/Feeding: NPO Grooming: Wash/dry hands, Oral care, Sitting, Minimal assistance Upper Body Bathing: Moderate assistance, Sitting Upper Body Bathing Details (indicate cue type and reason): washed pt's back while in sitting Lower Body Bathing: Total assistance, Bed level Upper Body Dressing : Bed level, Moderate assistance Lower Body Dressing: Total assistance, Bed level Lower Body Dressing Details (indicate cue type and reason): socks Toileting- Clothing Manipulation and Hygiene: Total assistance, Bed level  Cognition: Cognition Overall Cognitive Status: Within Functional Limits for tasks assessed Orientation Level: Oriented X4 Cognition Arousal/Alertness: Awake/alert Behavior During Therapy: WFL for tasks assessed/performed, Anxious Overall Cognitive Status: Within Functional Limits for tasks assessed  Blood pressure 130/62, pulse 65, temperature 98.2 F (36.8 C), temperature source Oral, resp. rate 14, height 5\' 6"  (1.676 m), weight 140.4 kg (309 lb 8.4 oz), last menstrual period 11/18/2012, SpO2 98 %. Physical Exam  Vitals reviewed. Constitutional: She is oriented to person, place, and time. She appears well-developed and well-nourished.  HENT:  Head: Normocephalic and atraumatic.  Nasogastric tube in place  Eyes: Conjunctivae and EOM are normal.  Neck: Normal range of motion. Neck supple. No  thyromegaly present.  Tracheostomy tube in place  Cardiovascular: Normal rate and regular rhythm.  Respiratory: Effort normal and breath sounds normal.  GI: Soft. Bowel sounds are normal.  Abdominal wound is dressed  Musculoskeletal: She exhibits edema. She exhibits no tenderness.  Neurological: She is alert and oriented to person, place, and time.  Motor: Bilateral upper extremities 4+/5 proximal to distal Bilateral lower extremities hip flexion, knee extension 3 -/5, ankle dorsi/plantarflexion 4+/5 Sensation diminished to light touch bilateral feet  Skin: Skin is warm and dry.  Psychiatric: She has a normal mood and affect. Her behavior is normal.     Lab Results Last 24 Hours    Results for orders placed or performed during the hospital encounter of 03/28/16 (from the past 24 hour(s))  Glucose, capillary Status: None   Collection Time: 04/24/16 11:55 AM  Result Value Ref Range   Glucose-Capillary 87 65 - 99 mg/dL   Comment 1 Notify RN   Glucose, capillary Status: None   Collection Time: 04/24/16 4:18 PM  Result Value Ref Range   Glucose-Capillary 92 65 - 99 mg/dL   Comment 1 Notify RN   Glucose, capillary Status: None   Collection Time: 04/24/16 8:57 PM  Result Value Ref Range   Glucose-Capillary 87 65 - 99 mg/dL  Glucose, capillary Status: None   Collection Time: 04/24/16 11:51 PM  Result Value Ref Range   Glucose-Capillary 89 65 - 99 mg/dL   Comment 1 Notify RN   CBC with Differential/Platelet Status: Abnormal   Collection Time: 04/25/16 2:40 AM  Result Value Ref Range   WBC 7.6 4.0 - 10.5 K/uL   RBC 3.09 (L) 3.87 - 5.11 MIL/uL   Hemoglobin 8.6 (L) 12.0 - 15.0 g/dL   HCT 28.3 (L) 36.0 - 46.0 %  MCV 91.6 78.0 - 100.0 fL   MCH 27.8 26.0 - 34.0 pg   MCHC 30.4 30.0 - 36.0 g/dL   RDW 17.3 (H) 11.5 - 15.5 %   Platelets 398 150 - 400 K/uL    Neutrophils Relative % 61 %   Neutro Abs 4.6 1.7 - 7.7 K/uL   Lymphocytes Relative 28 %   Lymphs Abs 2.1 0.7 - 4.0 K/uL   Monocytes Relative 8 %   Monocytes Absolute 0.6 0.1 - 1.0 K/uL   Eosinophils Relative 3 %   Eosinophils Absolute 0.3 0.0 - 0.7 K/uL   Basophils Relative 0 %   Basophils Absolute 0.0 0.0 - 0.1 K/uL  Renal function panel Status: Abnormal   Collection Time: 04/25/16 2:48 AM  Result Value Ref Range   Sodium 134 (L) 135 - 145 mmol/L   Potassium 3.7 3.5 - 5.1 mmol/L   Chloride 95 (L) 101 - 111 mmol/L   CO2 30 22 - 32 mmol/L   Glucose, Bld 109 (H) 65 - 99 mg/dL   BUN 25 (H) 6 - 20 mg/dL   Creatinine, Ser 0.93 0.44 - 1.00 mg/dL   Calcium 7.9 (L) 8.9 - 10.3 mg/dL   Phosphorus 3.2 2.5 - 4.6 mg/dL   Albumin 2.0 (L) 3.5 - 5.0 g/dL   GFR calc non Af Amer >60 >60 mL/min   GFR calc Af Amer >60 >60 mL/min   Anion gap 9 5 - 15  Glucose, capillary Status: Abnormal   Collection Time: 04/25/16 4:27 AM  Result Value Ref Range   Glucose-Capillary 104 (H) 65 - 99 mg/dL   Comment 1 Notify RN   Glucose, capillary Status: Abnormal   Collection Time: 04/25/16 8:30 AM  Result Value Ref Range   Glucose-Capillary 102 (H) 65 - 99 mg/dL   Comment 1 Notify RN   BMET in AM Status: Abnormal   Collection Time: 04/25/16 9:43 AM  Result Value Ref Range   Sodium 134 (L) 135 - 145 mmol/L   Potassium 4.7 3.5 - 5.1 mmol/L   Chloride 96 (L) 101 - 111 mmol/L   CO2 29 22 - 32 mmol/L   Glucose, Bld 106 (H) 65 - 99 mg/dL   BUN 25 (H) 6 - 20 mg/dL   Creatinine, Ser 0.90 0.44 - 1.00 mg/dL   Calcium 8.3 (L) 8.9 - 10.3 mg/dL   GFR calc non Af Amer >60 >60 mL/min   GFR calc Af Amer >60 >60 mL/min   Anion gap 9 5 - 15      Imaging Results (Last 48 hours)    No results found.     Assessment/Plan: Diagnosis: Debilitation/multi-medical Labs and images independently reviewed. Records reviewed and summated above.  1. Does the need for close, 24 hr/day medical supervision in concert with the patient's rehab needs make it unreasonable for this patient to be served in a less intensive setting? Yes  2. Co-Morbidities requiring supervision/potential complications: COPD (monitor O2 sats and respiratory rate with increased physical activity), tobacco abuse (counsel), HTN (monitor and provide prns in accordance with increased physical exertion and pain),bilateral knee pain (ensure pain does not limit therapies), dysphagia (continue SLP, consider vital stim, advance diet as tolerated), ABLA (transfuse if necessary to ensure appropriate perfusion for increased activity tolerance), hyponatremia (cont to monitor, consider treatment if necessary), morbid obesity (Body mass index is 49.98 kg/(m^2)., Diet and exercise education, encourage weight loss to increase endurance and promote overall health), hypothyroidism (ensure appropriate mood and energy for therapies) 3. Due  to bladder management, safety, skin/wound care, disease management and patient education, does the patient require 24 hr/day rehab nursing? Yes 4. Does the patient require coordinated care of a physician, rehab nurse, PT (1-2 hrs/day, 5 days/week), OT (1-2 hrs/day, 5 days/week) and SLP (1-2 hrs/day, 5 days/week) to address physical and functional deficits in the context of the above medical diagnosis(es)? Yes Addressing deficits in the following areas: balance, endurance, locomotion, strength, transferring, bowel/bladder control, bathing, dressing, grooming, toileting and psychosocial support 5. Can the patient actively participate in an intensive therapy program of at least 3 hrs of therapy per day at least 5 days per week? Potentially 6. The potential for patient to make measurable gains while on inpatient rehab is  good 7. Anticipated functional outcomes upon discharge from inpatient rehab are TBD with PT, TBD with OT, independent and modified independent with SLP. 8. Estimated rehab length of stay to reach the above functional goals is: 20-24 days. 9. Does the patient have adequate social supports and living environment to accommodate these discharge functional goals? No 10. Anticipated D/C setting: TBD 11. Anticipated post D/C treatments: HH therapy and Home excercise program 12. Overall Rehab/Functional Prognosis: good  RECOMMENDATIONS: This patient's condition is appropriate for continued rehabilitative care in the following setting: Will need to clarify caregiver support at discharge. Patient will also need to participate more in therapies, additional to bed mobility. Patient has agreed to participate in recommended program. Yes Note that insurance prior authorization may be required for reimbursement for recommended care.  Comment: Rehab Admissions Coordinator to follow up.  Delice Lesch, MD 04/25/2016       Revision History     Date/Time User Provider Type Action   04/25/2016 4:10 PM Ankit Lorie Phenix, MD Physician Sign   04/25/2016 11:39 AM Cathlyn Parsons, PA-C Physician Assistant Pend   View Details Report       Routing History     Date/Time From To Method   04/25/2016 4:10 PM Ankit Lorie Phenix, MD Chiquita Loth, MD Fax

## 2016-05-02 NOTE — Progress Notes (Signed)
Physical Therapy Treatment Patient Details Name: DENNIE SNOW MRN: [id number] DOB: June 19, 1956 Today's Date: 05/02/2016    History of Present Illness 60 y.o. female admitted to [facility] on 03/28/16.  Pt adm with Necrotic stoma with peritonitis and fascial necrosis after recent colostomy in [place]. Underwent laparotomy, colostomy removal and end stapling of stoma, partial colectomy, omentectomy, debridement of wound--Dr. Hulen Skains, subsequent RUQ stoma and washout, subsequent ABRA placement. Underwent Removal of ABRA device, closure of abdominal wall with retention sutures on 04/18/16.  Post op vent dependent, eventually trached and now on trach collar with PMV speaking valve.  PMH -HTN, gout, obesity, COPD.    PT Comments    Pt is progressing with therapy and was able to stand today.  She seemed mildly agitated, frustrated, and anxious.  She reports that, "a lot of people have been in here this morning and I am jittery".  Her fear and pain are limiting her participation.  She refused to use the lift to get OOB to the chair.  PT will continue to follow acutely, d/c to CIR scheduled for later today.   Follow Up Recommendations  CIR     Equipment Recommendations  None recommended by PT    Recommendations for Other Services   NA     Precautions / Restrictions Precautions Precautions: Fall Required Braces or Orthoses: Other Brace/Splint Other Brace/Splint: abdominal binder    Mobility  Bed Mobility Overal bed mobility: Needs Assistance Bed Mobility: Supine to Sit;Sit to Supine Rolling: Min assist   Supine to sit: Mod assist;HOB elevated Sit to supine: +2 for physical assistance;Mod assist   General bed mobility comments: Pt needed min assist to reach for bed rail to roll initially, mod assist to pull up to sitting EOB and mod assist to help control trunk and support legs to get back into bed at end of session (two people helping with transition to supine).    Transfers Overall transfer  level: Needs assistance Equipment used: 2 person hand held assist Transfers: Sit to/from Stand Sit to Stand: +2 physical assistance;Mod assist         General transfer comment: Two person mod assist to stand ~15 seconds EOB, both knees blocked for safety, verbal cues for upright posture and to extend hips.  Pt with limited standing tolerance and would only agree to try once.           Balance Overall balance assessment: Needs assistance Sitting-balance support: Feet unsupported;Single extremity supported Sitting balance-Leahy Scale: Fair     Standing balance support: Bilateral upper extremity supported Standing balance-Leahy Scale: Poor                      Cognition Arousal/Alertness: Awake/alert Behavior During Therapy: Anxious;Agitated (pt seems on edge today, stating there have been too many peo) Overall Cognitive Status: Within Functional Limits for tasks assessed                         General Comments General comments (skin integrity, edema, etc.): Encouraged pt to let us lift her to the recliner chair with maxi sky total lift.  Pt refused.  Educated her on the fact that starting tomorrow she will be up and out of bed multiple hours on inpatient rehab and she continued to refuse OOB today.  She stated, "I will start tomorrow".  I encouraged pt to think positively about her mobility as the therapists on intpatient rehab are going to ask her to  do a lot of difficult things that will make her scared, but that she needs to trust that they will keep her safe while making her stronger.  She has not had very consistant therapists on acute, so it will likely help to have consistency on rehab.        Pertinent Vitals/Pain Pain Assessment: 0-10 Pain Score: 8  Pain Location: abdomen with transitions Pain Descriptors / Indicators: Burning Pain Intervention(s): Limited activity within patient's tolerance;Monitored during session;Repositioned           PT Goals  (current goals can now be found in the care plan section) Acute Rehab PT Goals Patient Stated Goal: Return home Progress towards PT goals: Progressing toward goals    Frequency  Min 3X/week    PT Plan Current plan remains appropriate       End of Session Equipment Utilized During Treatment: Oxygen;Other (comment) (TC) Activity Tolerance: Patient limited by fatigue;Patient limited by pain Patient left: in bed;with call bell/phone within reach     Time: 1135-1200 PT Time Calculation (min) (ACUTE ONLY): 25 min  Charges:  $Therapeutic Activity: 23-37 mins                      Trenace Coughlin B. Mosella Kasa, PT, DPT [phone]   05/02/2016, 1:14 PM

## 2016-05-02 NOTE — Progress Notes (Signed)
[facility] Progress Note  14 Days Post-Op  Subjective: Pt's pain much better controlled.  Didn't work with therapy yesterday.  No N/V, tolerating diet.  Having more formed stools.  Sat on side of bed 2 days ago.  Urinating well.  Says CCM wants to see her move more before decanulating.  Objective: Vital signs in last 24 hours: Temp:  [98 F (36.7 C)-98.9 F (37.2 C)] 98.5 F (36.9 C) (05/10 0347) Pulse Rate:  [55-63] 55 (05/10 0350) Resp:  [13-17] 13 (05/10 0350) BP: (118-141)/(55-70) 124/55 mmHg (05/10 0350) SpO2:  [94 %-100 %] 97 % (05/10 0416) FiO2 (%):  [28 %] 28 % (05/10 0416) Weight:  [310 lb 13.6 oz (141 kg)] 310 lb 13.6 oz (141 kg) (05/10 0400) Last BM Date: 05/01/16  Intake/Output from previous day: 05/09 0701 - 05/10 0700 In: 570 [P.O.:360; I.V.:210] Out: 500 [Urine:500] Intake/Output this shift:    PE: Gen: Alert, NAD, pleasant Card: RRR, no M/G/R heard Pulm: CTA, no W/R/R, good effort Abd: Obese, soft, mild distension, mild tenderness, +BS, no HSM, midline wound/ostomy site mostly clean, serous drainage, ulceration noted to ABRA sites and around retention sutures  Lab Results:   Recent Labs  05/01/16 0645 05/02/16 0300  WBC 7.0 8.0  HGB 7.8* 7.6*  HCT 26.7* 25.5*  PLT 268 264   BMET  Recent Labs  05/01/16 0645 05/02/16 0300  NA 135 136  K 3.9 4.0  CL 97* 101  CO2 27 28  GLUCOSE 88 90  BUN 10 12  CREATININE 0.97 0.85  CALCIUM 8.1* 8.1*   PT/INR No results for input(s): LABPROT, INR in the last 72 hours. CMP     Component Value Date/Time   NA 136 05/02/2016 0300   K 4.0 05/02/2016 0300   CL 101 05/02/2016 0300   CO2 28 05/02/2016 0300   GLUCOSE 90 05/02/2016 0300   BUN 12 05/02/2016 0300   CREATININE 0.85 05/02/2016 0300   CALCIUM 8.1* 05/02/2016 0300   PROT 6.3* 04/23/2016 0509   ALBUMIN 2.0* 05/02/2016 0300   AST 47* 04/23/2016 0509   ALT 37 04/23/2016 0509   ALKPHOS 86 04/23/2016 0509   BILITOT 0.4 04/23/2016  0509   GFRNONAA >60 05/02/2016 0300   GFRAA >60 05/02/2016 0300   Lipase     Component Value Date/Time   LIPASE 64* 03/28/2016 1705       Studies/Results: No results found.  Anti-infectives: Anti-infectives    Start     Dose/Rate Route Frequency Ordered Stop   04/18/16 1000  ciprofloxacin (CIPRO) IVPB 400 mg  Status:  Discontinued     400 mg 200 mL/hr over 60 Minutes Intravenous To Short Stay 04/17/16 1026 04/18/16 1340   04/03/16 2100  vancomycin (VANCOCIN) 1,500 mg in sodium chloride 0.9 % 500 mL IVPB  Status:  Discontinued     1,500 mg 250 mL/hr over 120 Minutes Intravenous Every 12 hours 04/02/16 2020 04/06/16 0943   04/02/16 2030  vancomycin (VANCOCIN) 2,500 mg in sodium chloride 0.9 % 500 mL IVPB     2,500 mg 250 mL/hr over 120 Minutes Intravenous  Once 04/02/16 2020 04/02/16 2306   03/29/16 1200  ciprofloxacin (CIPRO) IVPB 400 mg  Status:  Discontinued     400 mg 200 mL/hr over 60 Minutes Intravenous Every 12 hours 03/29/16 0213 04/06/16 0943   03/29/16 0600  [MAR Hold]  ciprofloxacin (CIPRO) IVPB 400 mg     (MAR Hold since 03/28/16 2357)   400 mg 200  mL/hr over 60 Minutes Intravenous On call to O.R. 03/28/16 2306 03/28/16 2355   03/29/16 0600  [MAR Hold]  metroNIDAZOLE (FLAGYL) IVPB 500 mg     (MAR Hold since 03/28/16 2357)   500 mg 100 mL/hr over 60 Minutes Intravenous On call to O.R. 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 Hulen Skains  Left colectomy and end colostomy (03/30/16)--Dr. Donne Hazel Re-exploration of open abdomen and application of wound vac (04/02/16)--Dr. Redmond Pulling Placement of ABRA abdominal wall closure device (04/05/16)--Dr. Redmond Pulling Removal of ABRA device and closure of abdominal wall (04/18/16)--Dr. Cornett -PT/OT recommending inpatient rehab, this is likely most appropriate for her. SNF as back up -Continue BID wet to dry dressing changes to previous ostomy site and midline wound.  -Continue with retention sutures (6-8 weeks), WOC following to help with skin breakdown from retention sutures and ABRA elastomer sites -Pain controlled with orals and fentanyl patch (these will need to be weaned ar CIR) Septic shock-resolved AKI-resolved Anemia of critical illness-dropped a bit, Hgb 7.6 Hypothyroidism-home meds Acute respiratory failure-s/p trach, down sized per CCM, appreciate assistance. PCCM wants to see more mobility before decanulating.  They will check her on Thursday to see if she's ready. VTE prophylaxis-SCD/lovenox PCM-on Reg diet, nutritional supplements Dispo-Medically ready for CIR, to [facility] when bed available and insurance approval.  I think this is a much better option than SNF for her.    LOS: 35 days    Nat Christen 05/02/2016, 7:32 AM Pager: [phone]  (7am - 4:30pm M-F; 7am - 11:30am Sa/Su)

## 2016-05-02 NOTE — H&P (Addendum)
Physical Medicine and Rehabilitation Admission H&P    Chief Complaint  Patient presents with  . Debility due to sepsis and multiple medical issues     HPI: Christina Castaneda is a 60 year old female with history of COPD with OSA, morbid obesity, vitamin B 12 deficiency, perforated diverticulitis s/p sigmoid colectomy with colostomy in [place], [place] on 03/20 who was discharged to home in [place], [place]. At follow up appointment for staple removal, she was found to have feculent discharge and was admitted via ED on 03/28/16. 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 Hulen Skains on 04/05. Abdomen kept open with wound VAC required multiple procedures including ABRA device to help with final wound closure on 04/18/16.  She continues to have retention sutures as well as evidence of breakdown and wet to dry dressing to prior ostomy site. Septic shock resolved and she required tracheostomy due to inability to tolerate vent wean.  She is tolerating PMSV  And was started on regular diet on 05/04.  CCM recommends leaving cuffed #6 in place until ready for decannulation--they will follow weekly for input.  Cortrack removed on 05/05 and patient encouraged to increase po intake to help with healing. Therapy ongoing and patient noted to be severely deconditioned and working on pre-gait activity. CIR recommended by rehab team for follow up therapy.     Review of Systems  Constitutional: Positive for malaise/fatigue.  HENT: Negative for hearing loss.   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   Cardiovascular: Negative for chest pain and palpitations.  Gastrointestinal: Negative for heartburn, nausea, vomiting and constipation.  Genitourinary: Negative for dysuria and urgency.    Musculoskeletal: Negative for myalgias, back pain and joint pain.  Skin: Negative for itching and rash.  Neurological: Positive for sensory change (feet and legs numb--"can't stand or walk on nerf balls") and weakness. Negative for focal weakness and headaches.  All other systems reviewed and are negative.     Past Medical History  Diagnosis Date  . Goiter   . Hypothyroidism   . Asthma     mild  . Hypertension   . Atypical endometrial hyperplasia   . Heart murmur   . Vitamin B12 deficiency   . Yeast infection     for last 3 weeks  . Gout   . COPD (chronic obstructive pulmonary disease) [facility])     Past Surgical History  Procedure Laterality Date  . Neck surgery  1994    repair disk  . Tonsillectomy      as a child  . Cholecystectomy  2009    gallstone removed  . Robotic assisted total hysterectomy with bilateral salpingo oopherectomy  11/18/2012    Procedure: ROBOTIC ASSISTED TOTAL HYSTERECTOMY WITH BILATERAL SALPINGO OOPHORECTOMY;  Surgeon: Imagene Gurney A. Alycia Rossetti, MD;  Location: [facility];  Service: Gynecology;  Laterality: N/A;  . Colostomy revision N/A 03/28/2016    Procedure: COLOSTOMY REVISION;  Surgeon: Judeth Horn, MD;  Location: [place];  Service: General;  Laterality: N/A;  . Laparotomy N/A 03/28/2016    Procedure: EXPLORATORY LAPAROTOMY;  Surgeon: Judeth Horn, MD;  Location: [place];  Service: General;  Laterality: N/A;  . Omentectomy N/A 03/28/2016    Procedure: OMENTECTOMY;  Surgeon: Judeth Horn, MD;  Location:  [place] [facility];  Service: General;  Laterality: N/A;  . Wound debridement N/A 03/28/2016    Procedure: DEBRIDEMENT WOUND;  Surgeon: Judeth Horn, MD;  Location: [place];  Service: General;  Laterality: N/A;  . Vacuum assisted closure change N/A 03/30/2016    Procedure: ABDOMINAL VAC CHANGE;  Surgeon: Rolm Bookbinder, MD;  Location: [place];  Service: General;  Laterality: N/A;  . Colostomy N/A 03/30/2016    Procedure: COLOSTOMY;  Surgeon: Rolm Bookbinder, MD;  Location: [place];  Service:  General;  Laterality: N/A;  . Colostomy revision N/A 03/30/2016    Procedure: COLON RESECTION LEFT;  Surgeon: Rolm Bookbinder, MD;  Location: [place];  Service: General;  Laterality: N/A;  . Laparotomy N/A 04/02/2016    Procedure: Re-exploration of open abdomen, application of abdominal wound vac;  Surgeon: Greer Pickerel, MD;  Location: [place];  Service: General;  Laterality: N/A;  . Application of wound vac N/A 04/02/2016    Procedure: application of wound vac+;  Surgeon: Greer Pickerel, MD;  Location: [place];  Service: General;  Laterality: N/A;  . Laparotomy N/A 04/04/2016    Procedure: EXPLORATORY LAPAROTOMY, PLACEMENT OF ABRA ABDOMINAL WALL CLOSURE SET ;  Surgeon: Greer Pickerel, MD;  Location: [place];  Service: General;  Laterality: N/A;  . Laparotomy N/A 04/18/2016    Procedure: EXPLORATORY LAPAROTOMY;  Surgeon: Erroll Luna, MD;  Location: [place];  Service: General;  Laterality: N/A;  . Abdominal wall defect repair N/A 04/18/2016    Procedure: CLOSURE OF ABDOMEN;  Surgeon: Erroll Luna, MD;  Location: [facility];  Service: General;  Laterality: N/A;    Family History  Problem Relation Age of Onset  . COPD Mother   . Diabetes Mother   . Congestive Heart Failure Mother   . Diabetes Father   . 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  Live in [place] and independent without AD. Sedentary and sits most of the time. She had difficulty standing for prolonge  due to foot pain/numbness. She reports that she has been smoking Cigarettes--1 1/2 PPD.  She has a 28 pack-year smoking history. She has never used smokeless tobacco. She reports that she drinks about 0.5 - 1.5 oz of alcohol per week. She reports that she does not use illicit drugs.    Allergies  Allergen Reactions  . Penicillins Anaphylaxis, Hives, Shortness Of Breath, Swelling and Other (See Comments)    Angioedema    Medications Prior to Admission  Medication Sig Dispense Refill  . ADVAIR DISKUS 250-50  MCG/DOSE AEPB Once in the am & once at pm    . albuterol (PROVENTIL HFA;VENTOLIN HFA) 108 (90 BASE) MCG/ACT inhaler Inhale 2 puffs into the lungs every 6 (six) hours as needed. Wheezing and shortness of breath    . ergocalciferol (VITAMIN D2) 50000 UNITS capsule Take 50,000 Units by mouth once a week.    . Febuxostat (ULORIC) 80 MG TABS Take 80 mg by mouth daily before breakfast.     . levothyroxine (SYNTHROID, LEVOTHROID) 200 MCG tablet Take 200 mcg by mouth daily before breakfast.     . lisinopril (PRINIVIL,ZESTRIL) 20 MG tablet Take 20 mg by mouth daily.    Marland Kitchen nystatin cream (MYCOSTATIN) Apply topically 2 (two) times daily. For yeast infection      Home: Home Living Family/patient expects to be discharged to:: Private residence Living Arrangements: Spouse/significant other Available Help at Discharge: Family, Available PRN/intermittently Type of Home: House Home Access: Stairs to  enter Entrance Stairs-Number of Steps: 2 Entrance Stairs-Rails: Right Home Layout: Two level, Laundry or work area in basement, Able to live on main level with bedroom/bathroom Bathroom Shower/Tub: Chiropodist: Standard Home Equipment: Environmental consultant - 2 wheels, Bedside commode   Functional History: Prior Function Level of Independence: Independent  Functional Status:  Mobility: Bed Mobility Overal bed mobility: Needs Assistance, +2 for physical assistance Bed Mobility: Sit to Supine Rolling: Min assist Sidelying to sit: Mod assist Sit to supine: Mod assist, +2 for physical assistance Sit to sidelying: Mod assist, +2 for physical assistance General bed mobility comments: rolling in bed for using bedpan and hygiene as well as donning abdominal binder prior to assist to elevate trunk with +2 for safety Transfers Overall transfer level: Needs assistance Equipment used: Ambulation equipment used Clarise Cruz Plus) Transfer via Lift Equipment: Marketing executive Transfers: Sit to/from Stand Sit to Stand:  +2 physical assistance, Mod assist Stand pivot transfers: Mod assist, +2 physical assistance General transfer comment: assist to stand with Clarise Cruz Plus, but pt unable to come up all the way to standing due to fatigued after dressing changes and having to switch out [employer] due to not working, but attempted x 3 with ability to lift hips from EOB x 3 attempts, but not to come all the way up.  Ambulation/Gait General Gait Details: Not ready yet    ADL: ADL Overall ADL's : Needs assistance/impaired Eating/Feeding: NPO Grooming: Wash/dry hands, Oral care, Sitting, Minimal assistance Upper Body Bathing: Moderate assistance, Sitting Upper Body Bathing Details (indicate cue type and reason): washed pt's back while in sitting Lower Body Bathing: Total assistance, Bed level Upper Body Dressing : Bed level, Moderate assistance Lower Body Dressing: Total assistance, Bed level Lower Body Dressing Details (indicate cue type and reason): socks Toileting- Clothing Manipulation and Hygiene: Total assistance, Bed level  Cognition: Cognition Overall Cognitive Status: Within Functional Limits for tasks assessed Orientation Level: Oriented X4 Cognition Arousal/Alertness: Awake/alert Behavior During Therapy: WFL for tasks assessed/performed Overall Cognitive Status: Within Functional Limits for tasks assessed  Physical Exam: Blood pressure 124/55, pulse 52, temperature 98.5 F (36.9 C), temperature source Oral, resp. rate 18, height _0  (1.676 m), weight 141 kg (310 lb 13.6 oz), last menstrual period 11/18/2012, SpO2 96 %. Physical Exam  Nursing note and vitals reviewed. Constitutional: She is oriented to person, place, and time. She appears well-developed and well-nourished.  Morbidly obese female  HENT:  Head: Normocephalic and atraumatic.  Eyes: Conjunctivae and EOM are normal. Pupils are equal, round, and reactive to light.  Neck:  Limited by trach in place--air loss heard    Cardiovascular: Normal rate and regular rhythm.   No murmur heard. Respiratory: Effort normal. No stridor. She has decreased breath sounds in the right lower field and the left lower field. She has wheezes. She has rhonchi.  GI: Soft. Bowel sounds are normal. She exhibits no distension. There is tenderness.  Ostomy with semi-formed stool.   Musculoskeletal: She exhibits edema. She exhibits no tenderness.  Neurological: She is alert and oriented to person, place, and time.  Motor:  Bilateral upper extremities 4+/5 proximal to distal Bilateral lower extremities hip flexion, knee extension 4-/5, ankle dorsi/plantarflexion 4+/5 Sensation diminished to light touch bilateral feet   Skin: Skin is warm and dry.  Midline abdominal incision with retention sutures as well as dehiscence in between. ABD pad and gauze soaked with serous drainage. Surrounding skin macerated appearing.   Psychiatric: Her speech is normal. Her affect is  blunt.  Slightly agitated    Results for orders placed or performed during the hospital encounter of 03/28/16 (from the past 48 hour(s))  Glucose, capillary     Status: None   Collection Time: 04/30/16 12:08 PM  Result Value Ref Range   Glucose-Capillary 96 65 - 99 mg/dL   Comment 1 Notify RN    Comment 2 Document in Chart   Glucose, capillary     Status: None   Collection Time: 04/30/16  4:50 PM  Result Value Ref Range   Glucose-Capillary 86 65 - 99 mg/dL  Glucose, capillary     Status: Abnormal   Collection Time: 04/30/16  9:03 PM  Result Value Ref Range   Glucose-Capillary 103 (H) 65 - 99 mg/dL  Renal function panel     Status: Abnormal   Collection Time: 05/01/16  6:45 AM  Result Value Ref Range   Sodium 135 135 - 145 mmol/L   Potassium 3.9 3.5 - 5.1 mmol/L   Chloride 97 (L) 101 - 111 mmol/L   CO2 27 22 - 32 mmol/L   Glucose, Bld 88 65 - 99 mg/dL   BUN 10 6 - 20 mg/dL   Creatinine, Ser 0.97 0.44 - 1.00 mg/dL   Calcium 8.1 (L) 8.9 - 10.3 mg/dL    Phosphorus 4.0 2.5 - 4.6 mg/dL   Albumin 2.1 (L) 3.5 - 5.0 g/dL   GFR calc non Af Amer >60 >60 mL/min   GFR calc Af Amer >60 >60 mL/min    Comment: (NOTE) The eGFR has been calculated using the CKD EPI equation. This calculation has not been validated in all clinical situations. eGFR's persistently <60 mL/min signify possible Chronic Kidney Disease.    Anion gap 11 5 - 15  CBC with Differential/Platelet     Status: Abnormal   Collection Time: 05/01/16  6:45 AM  Result Value Ref Range   WBC 7.0 4.0 - 10.5 K/uL   RBC 2.84 (L) 3.87 - 5.11 MIL/uL   Hemoglobin 7.8 (L) 12.0 - 15.0 g/dL   HCT 26.7 (L) 36.0 - 46.0 %   MCV 94.0 78.0 - 100.0 fL   MCH 27.5 26.0 - 34.0 pg   MCHC 29.2 (L) 30.0 - 36.0 g/dL   RDW 17.6 (H) 11.5 - 15.5 %   Platelets 268 150 - 400 K/uL   Neutrophils Relative % 49 %   Neutro Abs 3.4 1.7 - 7.7 K/uL   Lymphocytes Relative 41 %   Lymphs Abs 2.9 0.7 - 4.0 K/uL   Monocytes Relative 7 %   Monocytes Absolute 0.5 0.1 - 1.0 K/uL   Eosinophils Relative 3 %   Eosinophils Absolute 0.2 0.0 - 0.7 K/uL   Basophils Relative 0 %   Basophils Absolute 0.0 0.0 - 0.1 K/uL  Glucose, capillary     Status: None   Collection Time: 05/01/16  8:20 AM  Result Value Ref Range   Glucose-Capillary 76 65 - 99 mg/dL   Comment 1 Notify RN    Comment 2 Document in Chart   Glucose, capillary     Status: None   Collection Time: 05/01/16 11:51 AM  Result Value Ref Range   Glucose-Capillary 90 65 - 99 mg/dL   Comment 1 Notify RN    Comment 2 Document in Chart   Glucose, capillary     Status: None   Collection Time: 05/01/16  5:38 PM  Result Value Ref Range   Glucose-Capillary 93 65 - 99 mg/dL   Comment 1 Notify  RN    Comment 2 Document in Chart   Glucose, capillary     Status: None   Collection Time: 05/01/16  9:07 PM  Result Value Ref Range   Glucose-Capillary 97 65 - 99 mg/dL  Renal function panel     Status: Abnormal   Collection Time: 05/02/16  3:00 AM  Result Value Ref Range    Sodium 136 135 - 145 mmol/L   Potassium 4.0 3.5 - 5.1 mmol/L   Chloride 101 101 - 111 mmol/L   CO2 28 22 - 32 mmol/L   Glucose, Bld 90 65 - 99 mg/dL   BUN 12 6 - 20 mg/dL   Creatinine, Ser 0.85 0.44 - 1.00 mg/dL   Calcium 8.1 (L) 8.9 - 10.3 mg/dL   Phosphorus 3.5 2.5 - 4.6 mg/dL   Albumin 2.0 (L) 3.5 - 5.0 g/dL   GFR calc non Af Amer >60 >60 mL/min   GFR calc Af Amer >60 >60 mL/min    Comment: (NOTE) The eGFR has been calculated using the CKD EPI equation. This calculation has not been validated in all clinical situations. eGFR's persistently <60 mL/min signify possible Chronic Kidney Disease.    Anion gap 7 5 - 15  CBC with Differential/Platelet     Status: Abnormal   Collection Time: 05/02/16  3:00 AM  Result Value Ref Range   WBC 8.0 4.0 - 10.5 K/uL   RBC 2.73 (L) 3.87 - 5.11 MIL/uL   Hemoglobin 7.6 (L) 12.0 - 15.0 g/dL   HCT 25.5 (L) 36.0 - 46.0 %   MCV 93.4 78.0 - 100.0 fL   MCH 27.8 26.0 - 34.0 pg   MCHC 29.8 (L) 30.0 - 36.0 g/dL   RDW 17.9 (H) 11.5 - 15.5 %   Platelets 264 150 - 400 K/uL   Neutrophils Relative % 48 %   Neutro Abs 3.9 1.7 - 7.7 K/uL   Lymphocytes Relative 40 %   Lymphs Abs 3.2 0.7 - 4.0 K/uL   Monocytes Relative 9 %   Monocytes Absolute 0.7 0.1 - 1.0 K/uL   Eosinophils Relative 3 %   Eosinophils Absolute 0.2 0.0 - 0.7 K/uL   Basophils Relative 0 %   Basophils Absolute 0.0 0.0 - 0.1 K/uL  Glucose, capillary     Status: None   Collection Time: 05/02/16  8:35 AM  Result Value Ref Range   Glucose-Capillary 83 65 - 99 mg/dL   Comment 1 Notify RN    Comment 2 Document in Chart    No results found.     Medical Problem List and Plan: 1.  Weakness, decreased endurance secondary to debility from multiple medical issues 2.  DVT Prophylaxis/Anticoagulation: Pharmaceutical: Lovenox 3. Pain Management: continue Fentanyl patch 100 mcg with oxycodone prn for pain 4. Mood:  LCSW to follow for evaluation and support.  5. Neuropsych: This patient is  capable of making decisions on her own behalf. 6. Skin/Wound Care: Dressing changes--increase to tid to help decrease maceration.  7. Fluids/Electrolytes/Nutrition: Monitor I/O. Check lytes in am. Renal status stable. Add supplements to help manage low protein stores. 8. ABLA:  H/H stable at 7.6-8.0 range. Check anemia panel---has history of B12 deficiency and may need IM supplement. .  9.  COPD/OSA in setting of morbid obesity:  Continue ATC- tolerating PMSV.  Continue brovana bid and Pulmicort bid.  10. Hypothyroid: On supplement.  11. Morbid Obesity  Body mass index is 50.2 kg/(m^2).  Diet and exercise education  Will cont to encourage  weight loss to increase endurance and promote overall health 12. Tobacco abuse:  Continue counseling 13. Bilateral knee pain  Will continue to monitor and adjust medications as necessary 14. Hypothyroidism  Continue medications  Post Admission Physician Evaluation: 1. Functional deficits secondary  to debility from multiple medical issues. 2. Patient is admitted to receive collaborative, interdisciplinary care between the physiatrist, rehab nursing staff, and therapy team. 3. Patient's level of medical complexity and substantial therapy needs in context of that medical necessity cannot be provided at a lesser intensity of care such as a SNF. 4. Patient has experienced substantial functional loss from his/her baseline which was documented above under the "Functional History" and "Functional Status" headings.  Judging by the patient's diagnosis, physical exam, and functional history, the patient has potential for functional progress which will result in measurable gains while on inpatient rehab.  These gains will be of substantial and practical use upon discharge  in facilitating mobility and self-care at the household level. 5. Physiatrist will provide 24 hour management of medical needs as well as oversight of the therapy plan/treatment and provide guidance as  appropriate regarding the interaction of the two. 6. 24 hour rehab nursing will assist with bladder management, bowel management, safety, skin/wound care, disease management and patient education and help integrate therapy concepts, techniques,education, etc. 7. PT will assess and treat for/with: Lower extremity strength, range of motion, stamina, balance, functional mobility, safety, adaptive techniques and equipment, woundcare, coping skills, pain control, education.   Goals are: Min A/Supervision. 8. OT will assess and treat for/with: ADL's, functional mobility, safety, upper extremity strength, adaptive techniques and equipment, wound mgt, ego support, and community reintegration.   Goals are: Min A/Supervision. Therapy may not proceed with showering this patient. 9. Case Management and Social Worker will assess and treat for psychological issues and discharge planning. 10. Team conference will be held weekly to assess progress toward goals and to determine barriers to discharge. 11. Patient will receive at least 3 hours of therapy per day at least 5 days per week. 12. ELOS: 15-19 days. 13. Prognosis:  good  Delice Lesch, MD 05/02/2016

## 2016-05-02 NOTE — Clinical Social Work Note (Signed)
Patient will be going to CIR today instead of SNF. CSW notified Gerald Stabs at [facility] in [place] so that he could cancel her insurance authorization.  CSW signing off. Consult if any other social work needs arise.  Christina Castaneda, Christina Castaneda

## 2016-05-02 NOTE — Progress Notes (Signed)
Cleansed and trach and provide trach care, emptied colostomy, and changed abdominal dressing

## 2016-05-02 NOTE — Interval H&P Note (Signed)
Christina Castaneda was admitted today to Inpatient Rehabilitation with the diagnosis of debility from multiple medical issues.  The patient's history has been reviewed, patient examined, and there is no change in status.  Patient continues to be appropriate for intensive inpatient rehabilitation.  I have reviewed the patient's chart and labs.  Questions were answered to the patient's satisfaction. The PAPE has been reviewed and assessment remains appropriate.  Mouhamad Teed Lorie Phenix 05/02/2016, 11:44 PM

## 2016-05-02 NOTE — Discharge Summary (Signed)
Physician Discharge Summary  Christina Castaneda [id number] DOB: 03/05/1956 DOA: 03/28/2016  PCP: Christina Loth, MD  Consultation: CCM, WOC  Admit date: 03/28/2016 Discharge date: 05/02/2016  Recommendations for Outpatient Follow-up:   Follow-up Information    Follow up with [facility] .   Specialty:  [place] information:   [address] [place] [phone]      Call Christina Horn, MD.   Specialty:  General Surgery   Why:  post op check   Contact information:   [place] [address] [phone]      Discharge Diagnoses:  1. Necrotic stoma 2. Peritonitis 3. Fascial necrosis 4. Septic shock 5. AKI 6. Anemia of critical illness 7. Acute respiratory failure 8. Hypothyroidism 9. CPM   Surgical Procedure:  S/p laparotomy, colostomy removal and end stapling of stoma, partial colectomy, omentectomy, debridement of wound (03/29/16)--Dr. Hulen Castaneda  Left colectomy and end colostomy (03/30/16)--Dr. Donne Castaneda Re-exploration of open abdomen and application of wound vac (04/02/16)--Dr. Wilson Placement of ABRA abdominal wall closure device (04/05/16)--Dr. Redmond Castaneda Removal of ABRA device and closure of abdominal wall (04/18/16)--Dr. Cornett  Discharge Condition: stable Disposition: home  Diet recommendation: regular  Filed Weights   04/30/16 0500 05/01/16 0439 05/02/16 0400  Weight: 137 kg (302 lb 0.5 oz) 139 kg (306 lb 7 oz) 141 kg (310 lb 13.6 oz)    Filed Vitals:   05/02/16 1130 05/02/16 1212  BP:    Pulse: 89   Temp:  98.1 F (36.7 C)  Resp: 22      Hospital Course:  Christina Castaneda underwent a hartmann's procedure in [place] about 2-3 weeks prior to coming to the ED.  She presented with stool drainage from her midline wound and abdominal pain.  She was found to have peritonitis and underwent the procedure listed above.  She was felt open and kept on the ventilator.  Critical  medicine was consulted for management.  Septic shock was treated with IV antibiotics, IVF and pressors.  She went back to the OR on 4/7 and had a left colectomy and creation of end colostomy, but unable to close her abdomen.  She underwent ABRA on 04/04/16 by Dr. Redmond Castaneda.  She was able to be weaned off TPN and advanced on tube feeds. She had a tracheostomy placed to help with weaning.  Her abdomen was finally closed on 04/18/16. She was weaned to trach collar, PMV and downsized by CCM.  PT/OT began working with the patient and recommended inpatient rehab.  Sepsis resolved.  Antibiotics were stopped.  She remained stable.  Tube feeds were stopped and the patient was tolerating a diet.  On HD#35 she was felt stable for discharge to inpatient rehab.  CCM to recheck trach and decide whether to decannulate.  The patient is to follow up with Dr. Hulen Castaneda after discharge.  Retention sutures to remain in place(placed 04/18/16).    Discharge Instructions     Medication List    TAKE these medications        ADVAIR DISKUS 250-50 MCG/DOSE Aepb  Generic drug:  Fluticasone-Salmeterol  Once in the am & once at pm     albuterol 108 (90 Base) MCG/ACT inhaler  Commonly known as:  PROVENTIL HFA;VENTOLIN HFA  Inhale 2 puffs into the lungs every 6 (six) hours as needed. Wheezing and shortness of breath     ergocalciferol 50000 units capsule  Commonly known as:  VITAMIN  D2  Take 50,000 Units by mouth once a week.     levothyroxine 200 MCG tablet  Commonly known as:  SYNTHROID, LEVOTHROID  Take 200 mcg by mouth daily before breakfast.     lisinopril 20 MG tablet  Commonly known as:  PRINIVIL,ZESTRIL  Take 20 mg by mouth daily.     nystatin cream  Commonly known as:  MYCOSTATIN  Apply topically 2 (two) times daily. For yeast infection     ULORIC 80 MG Tabs  Generic drug:  Febuxostat  Take 80 mg by mouth daily before breakfast.           Follow-up Information    Follow up with [facility] .    Specialty:  [place] information:   [address] [place] [phone]      Call Christina Horn, MD.   Specialty:  General Surgery   Why:  post op check   Contact information:   [place] [address] [phone]        The results of significant diagnostics from this hospitalization (including imaging, microbiology, ancillary and laboratory) are listed below for reference.    Significant Diagnostic Studies: Dg Abd 1 View  04/05/2016  CLINICAL DATA:  Attempted advancement of feeding tube EXAM: ABDOMEN - 1 VIEW COMPARISON:  Earlier abdominal radiograph 04/05/2016 FINDINGS: Feeding tube is coiled in the proximal stomach with tip projecting over mid stomach. Injected contrast material opacifies the gastric fundus. The feeding tube was unable to be placed beyond the pylorus. IMPRESSION: Feeding tube coiled in stomach. Electronically Signed   By: Christina Castaneda M.D.   On: 04/05/2016 18:51   Dg Abd 1 View  04/05/2016  CLINICAL DATA:  Encounter for feeding tube placement. EXAM: ABDOMEN - 1 VIEW COMPARISON:  CT, 03/28/2016 FINDINGS: Enteric feeding tube passes below the diaphragm. Tip projects in the left mid abdomen, consistent with the metallic tip being in the distal stomach. Normal bowel gas pattern. Surgical vascular clips in the right upper quadrant from a prior cholecystectomy. There are skin staples in the left upper abdomen. IMPRESSION: Enteric feeding tube tip projects in the distal stomach. Electronically Signed   By: Christina Castaneda M.D.   On: 04/05/2016 09:31   Dg Chest Port 1 View  04/17/2016  CLINICAL DATA:  Respiratory difficulty EXAM: PORTABLE CHEST 1 VIEW COMPARISON:  04/15/2016 FINDINGS: Tracheostomy tube, feeding tube, right arm PICC are stable. Lungs are under aerated and grossly clear. Upper normal heart size. No pneumothorax. IMPRESSION: Stable exam. Low volumes without evidence of consolidation or  pulmonary edema. Electronically Signed   By: Christina Castaneda M.D.   On: 04/17/2016 07:40   Dg Chest Port 1 View  04/15/2016  CLINICAL DATA:  Endotracheal tube present EXAM: PORTABLE CHEST 1 VIEW COMPARISON:  04/11/2016 FINDINGS: Tracheostomy tube and feeding tube unchanged. RIGHT PICC line unchanged. Stable mildly enlarged cardiac silhouette. There is central venous congestion pattern unchanged. No consolidation or pneumothorax. IMPRESSION: 1. Stable support apparatus. 2. No interval change. 3. Central venous congestion. Electronically Signed   By: Suzy Bouchard M.D.   On: 04/15/2016 09:48   Dg Chest Port 1 View  04/11/2016  CLINICAL DATA:  Respiratory failure EXAM: PORTABLE CHEST 1 VIEW COMPARISON:  April 09, 2016 FINDINGS: Tracheostomy catheter tip is 5.4 cm above the carina. Feeding tube tip is below the diaphragm. Central catheter tip is in the superior vena cava. No pneumothorax. There is  no edema or consolidation. Heart is borderline enlarged with pulmonary vascularity within normal limits. No adenopathy. IMPRESSION: Tube and catheter positions as described without pneumothorax. No edema or consolidation. Stable cardiac prominence. Electronically Signed   By: Lowella Grip III M.D.   On: 04/11/2016 07:38   Dg Chest Port 1 View  04/09/2016  CLINICAL DATA:  60 year old female with respiratory failure. Subsequent encounter. EXAM: PORTABLE CHEST 1 VIEW COMPARISON:  04/08/2016. FINDINGS: Tracheostomy tube tip midline. Right central line tip distal superior vena cava level. Feeding tube in place with tip not imaged on present exam. Mild cardiomegaly. Mild pulmonary vascular prominence most notable centrally. Right infrahilar consolidation may represent atelectasis rather than infiltrate. Attention to this on follow-up. IMPRESSION: Mild pulmonary vascular prominence most notable centrally. Right infrahilar consolidation may represent atelectasis rather than infiltrate. Attention to this on follow-up.  Electronically Signed   By: Genia Del M.D.   On: 04/09/2016 08:04   Dg Chest Port 1 View  04/08/2016  CLINICAL DATA:  60 year old female with history of respiratory failure. EXAM: PORTABLE CHEST 1 VIEW COMPARISON:  Chest x-ray 04/07/2016. FINDINGS: A tracheostomy tube is in place with tip 5.7 cm above the carina. There is a right upper extremity PICC with tip terminating in the mid superior vena cava. Persistent opacity in the medial aspect of the right lower lobe likely corresponds to an area of scarring based on comparison with prior CT of the abdomen and pelvis 03/28/2016. No definite acute consolidative airspace disease. No pleural effusions. No evidence of pulmonary edema. Heart size is mildly enlarged. Upper mediastinal contours are within normal limits. IMPRESSION: 1. Support apparatus, as above. 2. Probable scarring in the right lower lobe redemonstrated, as above. No radiographic evidence of acute cardiopulmonary disease. Electronically Signed   By: Vinnie Langton M.D.   On: 04/08/2016 08:22   Dg Chest Port 1 View  04/07/2016  CLINICAL DATA:  60 year old female with a history of hypertension EXAM: PORTABLE CHEST 1 VIEW COMPARISON:  04/06/2016 FINDINGS: Cardiomediastinal silhouette unchanged in size and contour. Vague airspace and interstitial opacities at the bilateral lung bases. Unchanged tracheostomy tube. Unchanged gastric tube, terminating out of the field of view. Unchanged right sided upper extremity PICC. Atherosclerosis. IMPRESSION: Low lung volumes with bibasilar vague opacities, potentially atelectasis or developing consolidation. No pleural effusion. Unchanged right upper extremity PICC, gastric tube, and tracheostomy tube. Atherosclerosis. Signed, Dulcy Fanny. Earleen Newport, DO [facility] Electronically Signed   By: Corrie Mckusick D.O.   On: 04/07/2016 09:32   Dg Chest Port 1 View  04/06/2016  CLINICAL DATA:  Acute respiratory  failure. EXAM: PORTABLE CHEST 1 VIEW COMPARISON:  04/05/2016. FINDINGS: Tracheostomy tube, feeding tube, right PICC line in stable position. Cardiomegaly with normal pulmonary vascularity. Mild bibasilar atelectasis. Mild infiltrate left lung base cannot be excluded. No pleural effusion or pneumothorax IMPRESSION: One lines and tubes in stable position. 1. Low lung volumes with mild bibasilar atelectasis. Mild infiltrate left lung base cannot be excluded Electronically Signed   By: [place]   On: 04/06/2016 07:55   Dg Chest Port 1 View  04/05/2016  CLINICAL DATA:  Tracheostomy EXAM: PORTABLE CHEST 1 VIEW COMPARISON:  04/05/2016 and 03/31/2016 FINDINGS: Cardiomediastinal silhouette is stable. Endotracheal tube has been removed. New tracheostomy tube in place. There is NG feeding tube in place. Persistent mild basilar atelectasis. Central mild vascular congestion without convincing pulmonary edema. There is no evidence of pneumothorax. IMPRESSION: New tracheostomy tube in place. There is NG  feeding tube in place. Persistent mild basilar atelectasis. Central mild vascular congestion without convincing pulmonary edema. There is no evidence Electronically Signed   By: Lahoma Crocker M.D.   On: 04/05/2016 11:42   Dg Chest Port 1 View  04/05/2016  CLINICAL DATA:  Intubated. EXAM: PORTABLE CHEST 1 VIEW COMPARISON:  03/31/2016 FINDINGS: The endotracheal tube remains in place and terminates approximately 2.5 cm above the carina. Enteric tube courses into the left upper abdomen with tip not imaged. Right PICC terminates over the lower SVC. Cardiomediastinal silhouette is unchanged. There is mild bibasilar atelectasis, with improved aeration of the right lung base. No sizable pleural effusion or pneumothorax is identified. IMPRESSION: Mild bibasilar atelectasis with improved aeration of the right base. Electronically Signed   By: Logan Bores M.D.   On: 04/05/2016 07:45   Dg Abd Portable 1v  04/15/2016  CLINICAL  DATA:  Emesis 3 times last night. EXAM: PORTABLE ABDOMEN - 1 VIEW COMPARISON:  Abdominal film 04/05/2016 FINDINGS: The feeding tube looped in the stomach with tip in the fundus of the stomach. No dilated loops of large or small bowel. Small amount of contrast in the rectum. IMPRESSION: Feeding tube looped in the stomach with tip in the gastric fundus Electronically Signed   By: Suzy Bouchard M.D.   On: 04/15/2016 11:00   Dg [place] Dys Tube Plc W/fl-no Rad  04/05/2016  CLINICAL DATA:  NASO G TUBE PLACEMENT WITH FLUORO Fluoroscopy was utilized by the requesting physician.  No radiographic interpretation.    Microbiology: Recent Results (from the past 240 hour(s))  MRSA PCR Screening     Status: None   Collection Time: 04/28/16 12:35 AM  Result Value Ref Range Status   MRSA by PCR NEGATIVE NEGATIVE Final    Comment:        The GeneXpert MRSA Assay (FDA approved for NASAL specimens only), is one component of a comprehensive MRSA colonization surveillance program. It is not intended to diagnose MRSA infection nor to guide or monitor treatment for MRSA infections.      Labs: Basic Metabolic Panel:  Recent Labs Lab 04/28/16 0408 04/29/16 0516 04/30/16 0400 05/01/16 0645 05/02/16 0300  NA 134* 137 135 135 136  K 3.6 3.9 3.7 3.9 4.0  CL 95* 97* 98* 97* 101  CO2 30 30 29 27 28   GLUCOSE 93 85 85 88 90  BUN 27* 17 12 10 12   CREATININE 0.84 0.82 0.83 0.97 0.85  CALCIUM 8.4* 8.6* 8.2* 8.1* 8.1*  PHOS 3.8 3.9 3.7 4.0 3.5   Liver Function Tests:  Recent Labs Lab 04/28/16 0408 04/29/16 0516 04/30/16 0400 05/01/16 0645 05/02/16 0300  ALBUMIN 2.0* 2.0* 2.1* 2.1* 2.0*   No results for input(s): LIPASE, AMYLASE in the last 168 hours. No results for input(s): AMMONIA in the last 168 hours. CBC:  Recent Labs Lab 04/28/16 0400 04/29/16 0516 04/30/16 0400 05/01/16 0645 05/02/16 0300  WBC 7.6 6.4 6.7 7.0 8.0  NEUTROABS 3.8 3.0 3.1 3.4 3.9  HGB 8.1* 8.0* 7.8* 7.8* 7.6*  HCT  26.6* 27.2* 26.4* 26.7* 25.5*  MCV 94.7 94.1 94.3 94.0 93.4  PLT 332 328 290 268 264   Cardiac Enzymes: No results for input(s): CKTOTAL, CKMB, CKMBINDEX, TROPONINI in the last 168 hours. BNP: BNP (last 3 results)  Recent Labs  03/28/16 1705  BNP 87.2    ProBNP (last 3 results) No results for input(s): PROBNP in the last 8760 hours.  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

## 2016-05-02 NOTE — Care Management Note (Signed)
Case Management Note Previous CM note initiated by Tomi Bamberger RN, CM  Patient Details  Name: LISA FAUNTLEROY MRN: [id number] Date of Birth: [birth year]  Subjective/Objective:     NCM received call back from [facility] and they state they do no think patient is ready for inpatient rehab just yet and it will probably be better for her to do rehab here at [facility] where MD 's can follow her.  NCM awaiting to hear from [facility] in [place]  Per MD patient will be for decannulation maybe today after she checks with PCCM.  NCM will cont to follow for dc needs.               Action/Plan: 05/02/16- spoke with Genie from CIR- they have insurance approval and bed available today- plan to d/c pt to CIR later today.   Expected Discharge Date:    05/02/16              Expected Discharge Plan:  [place]  In-House Referral:     Discharge planning Services  CM Consult  Post Acute Care Choice:    Choice offered to:     DME Arranged:    DME Agency:     HH Arranged:    HH Agency:     Status of Service:  Completed, signed off  Medicare Important Message Given:    Date Medicare IM Given:    Medicare IM give by:    Date Additional Medicare IM Given:    Additional Medicare Important Message give by:     If discussed at [place] of Stay Meetings, dates discussed:    Additional Comments:  Dawayne Patricia, RN 05/02/2016, 3:24 PM

## 2016-05-03 ENCOUNTER — Inpatient Hospital Stay (HOSPITAL_COMMUNITY): Payer: BLUE CROSS/BLUE SHIELD

## 2016-05-03 ENCOUNTER — Inpatient Hospital Stay (HOSPITAL_COMMUNITY): Payer: BLUE CROSS/BLUE SHIELD | Admitting: Speech Pathology

## 2016-05-03 ENCOUNTER — Inpatient Hospital Stay (HOSPITAL_COMMUNITY): Payer: BLUE CROSS/BLUE SHIELD | Admitting: Occupational Therapy

## 2016-05-03 DIAGNOSIS — R609 Edema, unspecified: Secondary | ICD-10-CM

## 2016-05-03 DIAGNOSIS — Z930 Tracheostomy status: Secondary | ICD-10-CM

## 2016-05-03 LAB — CBC WITH DIFFERENTIAL/PLATELET
Basophils Absolute: 0 10*3/uL (ref 0.0–0.1)
Basophils Relative: 0 %
Eosinophils Absolute: 0.2 10*3/uL (ref 0.0–0.7)
Eosinophils Relative: 3 %
HEMATOCRIT: 24.6 % — AB (ref 36.0–46.0)
HEMOGLOBIN: 7.5 g/dL — AB (ref 12.0–15.0)
LYMPHS ABS: 2.7 10*3/uL (ref 0.7–4.0)
Lymphocytes Relative: 39 %
MCH: 28.8 pg (ref 26.0–34.0)
MCHC: 30.5 g/dL (ref 30.0–36.0)
MCV: 94.6 fL (ref 78.0–100.0)
MONOS PCT: 7 %
Monocytes Absolute: 0.5 10*3/uL (ref 0.1–1.0)
NEUTROS ABS: 3.5 10*3/uL (ref 1.7–7.7)
NEUTROS PCT: 51 %
Platelets: 264 10*3/uL (ref 150–400)
RBC: 2.6 MIL/uL — ABNORMAL LOW (ref 3.87–5.11)
RDW: 17.6 % — ABNORMAL HIGH (ref 11.5–15.5)
WBC: 7 10*3/uL (ref 4.0–10.5)

## 2016-05-03 LAB — GLUCOSE, CAPILLARY
GLUCOSE-CAPILLARY: 104 mg/dL — AB (ref 65–99)
Glucose-Capillary: 84 mg/dL (ref 65–99)
Glucose-Capillary: 98 mg/dL (ref 65–99)
Glucose-Capillary: 111 mg/dL — ABNORMAL HIGH (ref 65–99)

## 2016-05-03 LAB — FOLATE: FOLATE: 10.2 ng/mL (ref >5.9)

## 2016-05-03 LAB — COMPREHENSIVE METABOLIC PANEL
ALK PHOS: 60 U/L (ref 38–126)
ALT: 15 U/L (ref 14–54)
ANION GAP: 8 (ref 5–15)
AST: 19 U/L (ref 15–41)
Albumin: 2 g/dL — ABNORMAL LOW (ref 3.5–5.0)
BILIRUBIN TOTAL: 0.5 mg/dL (ref 0.3–1.2)
BUN: 10 mg/dL (ref 6–20)
CALCIUM: 8.4 mg/dL — AB (ref 8.9–10.3)
CO2: 29 mmol/L (ref 22–32)
Chloride: 99 mmol/L — ABNORMAL LOW (ref 101–111)
Creatinine, Ser: 0.79 mg/dL (ref 0.44–1.00)
GFR calc non Af Amer: >60 mL/min (ref >60)
GLUCOSE: 88 mg/dL (ref 65–99)
Potassium: 4.1 mmol/L (ref 3.5–5.1)
Sodium: 136 mmol/L (ref 135–145)
TOTAL PROTEIN: 6 g/dL — AB (ref 6.5–8.1)

## 2016-05-03 LAB — VITAMIN B12: Vitamin B-12: 473 pg/mL (ref 180–914)

## 2016-05-03 LAB — FERRITIN: FERRITIN: 50 ng/mL (ref 11–307)

## 2016-05-03 LAB — RETICULOCYTES
RBC.: 2.6 MIL/uL — ABNORMAL LOW (ref 3.87–5.11)
Retic Count, Absolute: 135.2 10*3/uL (ref 19.0–186.0)
Retic Ct Pct: 5.2 % — ABNORMAL HIGH (ref 0.4–3.1)

## 2016-05-03 LAB — IRON AND TIBC
Iron: 18 ug/dL — ABNORMAL LOW (ref 28–170)
SATURATION RATIOS: 10 % — AB (ref 10.4–31.8)
TIBC: 188 ug/dL — AB (ref 250–450)
UIBC: 170 ug/dL

## 2016-05-03 MED ORDER — FERROUS SULFATE 325 (65 FE) MG PO TABS
325.0000 mg | ORAL_TABLET | Freq: Two times a day (BID) | ORAL | Status: DC
  Administered 2016-05-03 – 2016-05-23 (×40): 325 mg via ORAL
  Filled 2016-05-03 (×40): qty 1

## 2016-05-03 NOTE — Progress Notes (Signed)
Colostomy was changed by WOC. Administered breathing treatment, effective see eMAR. Continuous pulse ox

## 2016-05-03 NOTE — Progress Notes (Signed)
Patient information reviewed and entered into eRehab system by Joncarlo Friberg, RN, CRRN, PPS Coordinator.  Information including medical coding and functional independence measure will be reviewed and updated through discharge.    

## 2016-05-03 NOTE — Progress Notes (Signed)
[facility] PHYSICAL MEDICINE & REHABILITATION     PROGRESS NOTE    Subjective/Complaints: Had a fair night. Having abdominal pain this morning. Waiting for pain medication. Breathing fairly well. Legs numb  ROS: Pt denies fever, rash/itching, headache, blurred or double vision, nausea, vomiting,   diarrhea, chest pain, shortness of breath, palpitations, dysuria, dizziness, neck or back pain, bleeding, anxiety, or depression    Objective: Vital Signs: Blood pressure 127/61, pulse 61, temperature 98.4 F (36.9 C), temperature source Oral, resp. rate 18, height 5\' 7"  (1.702 m), weight 122.471 kg (270 lb), last menstrual period 11/18/2012, SpO2 96 %. No results found.  Recent Labs  05/02/16 0300 05/03/16 0445  WBC 8.0 7.0  HGB 7.6* 7.5*  HCT 25.5* 24.6*  PLT 264 264    Recent Labs  05/02/16 0300 05/03/16 0445  NA 136 136  K 4.0 4.1  CL 101 99*  GLUCOSE 90 88  BUN 12 10  CREATININE 0.85 0.79  CALCIUM 8.1* 8.4*   CBG (last 3)   Recent Labs  05/02/16 1714 05/02/16 2106 05/03/16 0634  GLUCAP 99 81 84    Wt Readings from Last 3 Encounters:  05/03/16 122.471 kg (270 lb)  05/02/16 141 kg (310 lb 13.6 oz)  01/07/14 138.347 kg (305 lb)    Physical Exam:  Constitutional: She is oriented to person, place, and time. She appears well-developed and well-nourished.  Morbidly obese female  HENT:  Head: Normocephalic and atraumatic.  Eyes: Conjunctivae and EOM are normal. Pupils are equal, round, and reactive to light.  Neck:  #6 trach with PMV. Reasonable phonation Cardiovascular: Normal rate and regular rhythm.  No murmur heard. Respiratory: Effort normal. No stridor. She has decreased breath sounds in the right lower field and the left lower field. She has wheezes. She has rhonchi.  GI: Soft. Bowel sounds are normal. She exhibits no distension. There is tenderness.  Ostomy with semi-formed stool. no leak Musculoskeletal: She exhibits edema. She exhibits no  tenderness.  Neurological: She is alert and oriented to person, place, and time.  Motor:  Bilateral upper extremities 4+/5 proximal to distal Bilateral lower extremities hip flexion, knee extension 4-/5, ankle dorsi/plantarflexion 4+/5 Sensation diminished to light touch bilateral feet  Skin: Skin is warm and dry.  Midline abdominal incision with retention sutures as well as dehiscence in between. ABD pad and gauze soaked with serous drainage. Surrounding skin still macerated    Psychiatric: Her speech is normal. Her affect is blunt.  Slightly anxious and irritable  Assessment/Plan: 1. debility secondary to weakness from multiple medical issues/respiratory failure which require 3+ hours per day of interdisciplinary therapy in a comprehensive inpatient rehab setting. Physiatrist is providing close team supervision and 24 hour management of active medical problems listed below. Physiatrist and rehab team continue to assess barriers to discharge/monitor patient progress toward functional and medical goals.  Function:  Bathing Bathing position      Bathing parts      Bathing assist        Upper Body Dressing/Undressing Upper body dressing                    Upper body assist        Lower Body Dressing/Undressing Lower body dressing                                  Lower body assist  Toileting Toileting          Toileting assist     Transfers Chair/bed Physiological scientist Comprehension    Expression    Social Interaction    Problem Solving    Memory     Medical Problem List and Plan: 1. Weakness, decreased endurance secondary to debility from multiple medical issues 2. DVT Prophylaxis/Anticoagulation: Pharmaceutical: Lovenox  -remains indicated 3. Pain Management: continue Fentanyl patch 100 mcg with oxycodone prn for pain  -states that pain is  predominantly abdominal  4. Mood: LCSW to follow for evaluation and support.  5. Neuropsych: This patient is capable of making decisions on her own behalf. 6. Skin/Wound Care: Dressing changes--increase to tid to help decrease maceration.   -area fairly clean at present. Continued serous drainage  -ostomy competent 7. Fluids/Electrolytes/Nutrition: Monitor I/O. Check lytes in am. Renal status stable. Add supplements to help manage low protein stores. 8. ABLA: H/H stable at 7.6-8.0 range.---7.5 today Check anemia panel--- iron deficiency anemia  -B12 normal.  9. COPD/OSA in setting of morbid obesity: Continue ATC- tolerating PMSV. Continue brovana bid and Pulmicort bid.   -continue trach for now. Need to increase mobility/activity tolerance 10. Hypothyroid: On supplement.  11. Morbid Obesity Body mass index is 50.2 kg/(m^2). Diet and exercise education Will cont to encourage weight loss to increase endurance and promote overall health 12. Tobacco abuse: Continue counseling 13. Bilateral knee pain Will continue to monitor and adjust medications as necessary 14. Hypothyroidism Continue medications LOS (Days) 1 A FACE TO FACE EVALUATION WAS PERFORMED  SWARTZ,ZACHARY T 05/03/2016 8:11 AM

## 2016-05-03 NOTE — Evaluation (Signed)
Occupational Therapy Assessment and Plan  Patient Details  Name: Christina Castaneda MRN: [id number] Date of Birth: Dec 12, 1956  OT Diagnosis: acute pain and muscle weakness (generalized) Rehab Potential: Rehab Potential (ACUTE ONLY): Good ELOS: 21-24 days   Today's Date: 05/03/2016 OT Individual Time: 2536-6440 OT Individual Time Calculation (min): 75 min     Problem List:  Patient Active Problem List   Diagnosis Date Noted  . Debility 05/02/2016  . Edema   . Generalized weakness   . Chronic pain syndrome   . Hypoalbuminemia due to protein-calorie malnutrition ([place])   . OSA (obstructive sleep apnea)   . Morbid obesity due to excess calories ([place])   . S/P colostomy ([place])   . Tracheostomy status ([place])   . Tracheostomy care ([place])   . Chronic obstructive pulmonary disease ([place])   . Tobacco abuse   . Essential hypertension   . Bilateral knee pain   . Dysphagia   . Acute blood loss anemia   . Hyponatremia   . Thyroid activity decreased   . Acute respiratory failure ([place])   . Pressure ulcer 04/01/2016  . 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 Ventilator dependence ([place]) post exp lap with open abd 03/29/2016  . Hypothyroid 03/29/2016  . Morbid obesity ([place]) 03/29/2016  . Open wound of abdomen 03/29/2016  . Infection of colostomy stoma ([place]) 03/28/2016  . 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 Obesity 11/12/2012    Past Medical History:  Past Medical History  Diagnosis Date  . Goiter   . Hypothyroidism   . Asthma     mild  . Hypertension   . Atypical endometrial hyperplasia   . Heart murmur   . Vitamin B12 deficiency   . Yeast infection     for last 3 weeks  . Gout   . COPD (chronic obstructive pulmonary disease) ([place])    Past Surgical History:  Past Surgical History  Procedure Laterality Date  . Neck surgery  1994    repair disk  . Tonsillectomy      as a child  . Cholecystectomy  2009    gallstone removed  . Robotic assisted total hysterectomy with  bilateral salpingo oopherectomy  11/18/2012    Procedure: ROBOTIC ASSISTED TOTAL HYSTERECTOMY WITH BILATERAL SALPINGO OOPHORECTOMY;  Surgeon: Imagene Gurney A. Alycia Rossetti, MD;  [facility];  Service: Gynecology;  Laterality: N/A;  . Colostomy revision N/A 03/28/2016    Procedure: COLOSTOMY REVISION;  Surgeon: Judeth Horn, MD;  Location: [place] [facility];  Service: General;  Laterality: N/A;  . Laparotomy N/A 03/28/2016    Procedure: EXPLORATORY LAPAROTOMY;  Surgeon: Judeth Horn, MD;  Location: [facility];  Service: General;  Laterality: N/A;  . Omentectomy N/A 03/28/2016    Procedure: OMENTECTOMY;  Surgeon: Judeth Horn, MD;  Location: [place];  Service: General;  Laterality: N/A;  . Wound debridement N/A 03/28/2016    Procedure: DEBRIDEMENT WOUND;  Surgeon: Judeth Horn, MD;  Location: [place];  Service: General;  Laterality: N/A;  . Vacuum assisted closure change N/A 03/30/2016    Procedure: ABDOMINAL VAC CHANGE;  Surgeon: Rolm Bookbinder, MD;  Location: [place] [facility];  Service: General;  Laterality: N/A;  . Colostomy N/A 03/30/2016    Procedure: COLOSTOMY;  Surgeon: Rolm Bookbinder, MD;  Location: [place];  Service: General;  Laterality: N/A;  . Colostomy revision N/A 03/30/2016    Procedure: COLON RESECTION LEFT;  Surgeon: Rolm Bookbinder, MD;  Location: [place] [facility];  Service: General;  Laterality: N/A;  . Laparotomy N/A 04/02/2016    Procedure:  Re-exploration of open abdomen, application of abdominal wound vac;  Surgeon: Greer Pickerel, MD;  Location: [facility];  Service: General;  Laterality: N/A;  . Application of wound vac N/A 04/02/2016    Procedure: application of wound vac+;  Surgeon: Greer Pickerel, MD;  Location: [place] [facility];  Service: General;  Laterality: N/A;  . Laparotomy N/A 04/04/2016    Procedure: EXPLORATORY LAPAROTOMY, PLACEMENT OF ABRA ABDOMINAL WALL CLOSURE SET ;  Surgeon: Greer Pickerel, MD;  Location: [place];  Service: General;  Laterality: N/A;  . Laparotomy N/A 04/18/2016    Procedure: EXPLORATORY LAPAROTOMY;  Surgeon: Erroll Luna, MD;   Location: [facility];  Service: General;  Laterality: N/A;  . Abdominal wall defect repair N/A 04/18/2016    Procedure: CLOSURE OF ABDOMEN;  Surgeon: Erroll Luna, MD;  Location: [place];  Service: General;  Laterality: N/A;    Assessment & Plan Clinical Impression: Christina Castaneda is a 60 year old female with history of COPD with OSA, morbid obesity, vitamin B 12 deficiency, perforated diverticulitis s/p sigmoid colectomy with colostomy in [place], [place] on 03/20 who was discharged to home in [place], [place]. At follow up appointment for staple removal, she was found to have feculent discharge and was admitted via ED on 03/28/16. 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 Hulen Skains on 04/05. Abdomen kept open with wound VAC required multiple procedures including ABRA device to help with final wound closure on 04/18/16.  She continues to have retention sutures as well as evidence of breakdown and wet to dry dressing to prior ostomy site. Septic shock resolved and she required tracheostomy due to inability to tolerate vent wean.  She is tolerating PMSV  And was started on regular diet on 05/04.  CCM recommends leaving cuffed #6 in place until ready for decannulation--they will follow weekly for input.  Cortrack removed on 05/05 and patient encouraged to increase po intake to help with healing. Therapy ongoing and patient noted to be severely deconditioned and working on pre-gait activity. CIR recommended by rehab team for follow up therapy.   Patient transferred to CIR on 05/02/2016 .    Patient currently requires max with basic self-care skills secondary to muscle weakness, decreased cardiorespiratoy endurance and decreased oxygen support and decreased standing balance.  Prior to hospitalization, patient could complete ADLs with independent .  Patient will  benefit from skilled intervention to increase independence with basic self-care skills prior to discharge home with care partner.  Anticipate patient will require minimal physical assistance and follow up home health.  OT - End of Session Activity Tolerance: Tolerates < 10 min activity, no significant change in vital signs Endurance Deficit: Yes Endurance Deficit Description: needs frequent rest breaks OT Assessment Rehab Potential (ACUTE ONLY): Good Barriers to Discharge: Decreased caregiver support Barriers to Discharge Comments: spouse works during day OT Patient demonstrates impairments in the following area(s): Balance;Endurance;Motor;Sensory OT Basic ADL's Functional Problem(s): Bathing;Dressing;Toileting OT Advanced ADL's Functional Problem(s): Simple Meal Preparation;Light Housekeeping OT Transfers Functional Problem(s): Toilet;Tub/Shower OT Additional Impairment(s): None OT Plan OT Intensity: Minimum of 1-2 x/day, 45 to 90 minutes OT Frequency: 5 out of 7 days OT Duration/Estimated Length of Stay: 21-24 days OT Treatment/Interventions: Balance/vestibular training;Discharge planning;DME/adaptive equipment instruction;Functional mobility training;Patient/family education;Psychosocial support;Self Care/advanced ADL retraining;Therapeutic Activities;Therapeutic Exercise;UE/LE Strength taining/ROM OT Self Feeding Anticipated Outcome(s): I OT Basic Self-Care Anticipated Outcome(s): S/ set  up bathing, LB dressing OT Toileting Anticipated Outcome(s): mod I  OT Bathroom Transfers Anticipated Outcome(s): mod I to toilet, S to tub bench in tub OT Recommendation Patient destination: Home Follow Up Recommendations: Home health OT Equipment Recommended: Tub/shower bench Equipment Details: has walker, BSC, shower seat   Skilled Therapeutic Intervention Pt seen for initial evaluation and ADL retraining. Spouse present. Reviewed and discussed role of OT, pt's goals, recommendations for home.   Pt did not have clothing available, spouse stated he will bring them tomorrow.  Pt very anxious about moving and stated she cannot move her LLE well. She actually did very well during the eval and participated well actively using LLE to push up in bed and assist with rolling. Used Clarise Cruz lift and pt pulled into standing with mod A .  She tolerated lift well maintaining good O2 levels.  W/c obtained for pt with elevating leg rests. Placed in w/c and pt stated she was comfortable for now.  Pt in room with all needs met. (did not place abdominal binder this am - as it would push on colostomy bag)  OT Evaluation Precautions/Restrictions  Precautions Precautions: Fall   Vital Signs Therapy Vitals Pulse Rate: 61 Resp: 18 Patient Position (if appropriate): Lying Oxygen Therapy SpO2: 100 % O2 Device: Tracheostomy Collar O2 Flow Rate (L/min): 5 L/min FiO2 (%): 28 % Pain Pain Assessment Pain Assessment: No/denies pain Pain Score: 3  Home Living/Prior Functioning Home Living Family/patient expects to be discharged to:: Private residence Living Arrangements: Spouse/significant other Available Help at Discharge: Family, Available PRN/intermittently Type of Home: House Home Access: Stairs to enter Technical brewer of Steps: 2 Entrance Stairs-Rails: Right Home Layout: Two level, Laundry or work area in basement, Able to live on main level with bedroom/bathroom Bathroom Shower/Tub: Chiropodist: Standard  Lives With: Spouse Prior Function Level of Independence: Independent with basic ADLs, Independent with gait Driving: Yes 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, 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 ADL ADL ADL Comments: refer to functional navigator Vision/Perception  Vision- History Baseline Vision/History: Wears glasses Wears Glasses: At all times Patient Visual Report: No change from baseline Vision- Assessment Additional Comments:  WFL Perception Comments: WFL  Cognition Overall Cognitive Status: Within Functional Limits for tasks assessed Arousal/Alertness: Awake/alert Orientation Level: Person;Place;Situation Person: Oriented Place: Oriented Situation: Oriented Year: 2017 Month: May Day of Week: Correct Memory: Appears intact Immediate Memory Recall: Sock;Blue;Bed Memory Recall: Sock;Blue;Bed Memory Recall Sock: Without Cue Memory Recall Blue: Without Cue Memory Recall Bed: Without Cue Awareness: Appears intact Problem Solving: Appears intact Safety/Judgment: Appears intact Sensation Sensation Light Touch: Impaired by gross assessment (decreased on LLE) Stereognosis: Appears Intact Hot/Cold: Appears Intact Proprioception: Appears Intact Coordination Gross Motor Movements are Fluid and Coordinated: No Fine Motor Movements are Fluid and Coordinated: Yes Coordination and Movement Description: limited AROM in LLE Motor  Motor Motor - Skilled Clinical Observations: generalized weakness, UE>LE Mobility    mod A for sit to stand to [facility] lift Trunk/Postural Assessment  Cervical Assessment Cervical Assessment: Within Functional Limits Thoracic Assessment Thoracic Assessment: Within Functional Limits Lumbar Assessment Lumbar Assessment: Within Functional Limits Postural Control Postural Control: Within Functional Limits  Balance Static Sitting Balance Static Sitting - Level of Assistance: 5: Stand by assistance Dynamic Sitting Balance Dynamic Sitting - Level of Assistance: 4: Min assist Static Standing Balance Static Standing - Level of Assistance: 2: Max assist Dynamic Standing Balance Dynamic Standing - Level of Assistance: Not tested (comment) Extremity/Trunk Assessment RUE Assessment RUE Assessment:  Within Functional Limits (4-/5 shoulder only, WFL distally) LUE Assessment LUE Assessment: Within Functional Limits (4-/5 shoulder, WFL distally)   See Function Navigator for Current  Functional Status.   Refer to Care Plan for Long Term Goals  Recommendations for other services: None  Discharge Criteria: Patient will be discharged from OT if patient refuses treatment 3 consecutive times without medical reason, if treatment goals not met, if there is a change in medical status, if patient makes no progress towards goals or if patient is discharged from hospital.  The above assessment, treatment plan, treatment alternatives and goals were discussed and mutually agreed upon: by patient and by family  [place] 05/03/2016, 10:44 AM

## 2016-05-03 NOTE — Evaluation (Signed)
Speech Language Pathology Assessment and Plan  Patient Details  Name: Christina Castaneda MRN: [id number] Date of Birth: 1956-03-02  SLP Diagnosis:     Today's Date: 05/03/2016 SLP Individual Time: 1000-1100 SLP Individual Time Calculation (min): 60 min   Problem List:  Patient Active Problem List   Diagnosis Date Noted  . Debility 05/02/2016  . Edema   . Generalized weakness   . Chronic pain syndrome   . Hypoalbuminemia due to protein-calorie malnutrition ([place])   . OSA (obstructive sleep apnea)   . Morbid obesity due to excess calories ([place])   . S/P colostomy ([place])   . Tracheostomy status ([place])   . Tracheostomy care ([place])   . Chronic obstructive pulmonary disease ([place])   . Tobacco abuse   . Essential hypertension   . Bilateral knee pain   . Dysphagia   . Acute blood loss anemia   . Hyponatremia   . Thyroid activity decreased   . Acute respiratory failure ([place])   . Pressure ulcer 04/01/2016  . 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 Ventilator dependence ([place]) post exp lap with open abd 03/29/2016  . Hypothyroid 03/29/2016  . Morbid obesity ([place]) 03/29/2016  . Open wound of abdomen 03/29/2016  . Infection of colostomy stoma ([place]) 03/28/2016  . 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 Obesity 11/12/2012   Past Medical History:  Past Medical History  Diagnosis Date  . Goiter   . Hypothyroidism   . Asthma     mild  . Hypertension   . Atypical endometrial hyperplasia   . Heart murmur   . Vitamin B12 deficiency   . Yeast infection     for last 3 weeks  . Gout   . COPD (chronic obstructive pulmonary disease) ([place])    Past Surgical History:  Past Surgical History  Procedure Laterality Date  . Neck surgery  1994    repair disk  . Tonsillectomy      as a child  . Cholecystectomy  2009    gallstone removed  . Robotic assisted total hysterectomy with bilateral salpingo oopherectomy  11/18/2012    Procedure: ROBOTIC ASSISTED TOTAL HYSTERECTOMY WITH BILATERAL  SALPINGO OOPHORECTOMY;  Surgeon: Imagene Gurney A. Alycia Rossetti, MD;  [facility];  Service: Gynecology;  Laterality: N/A;  . Colostomy revision N/A 03/28/2016    Procedure: COLOSTOMY REVISION;  Surgeon: Judeth Horn, MD;  Location: [place];  Service: General;  Laterality: N/A;  . Laparotomy N/A 03/28/2016    Procedure: EXPLORATORY LAPAROTOMY;  Surgeon: Judeth Horn, MD;  Location: [place];  Service: General;  Laterality: N/A;  . Omentectomy N/A 03/28/2016    Procedure: OMENTECTOMY;  Surgeon: Judeth Horn, MD;  Location: [place];  Service: General;  Laterality: N/A;  . Wound debridement N/A 03/28/2016    Procedure: DEBRIDEMENT WOUND;  Surgeon: Judeth Horn, MD;  Location: [place];  Service: General;  Laterality: N/A;  . Vacuum assisted closure change N/A 03/30/2016    Procedure: ABDOMINAL VAC CHANGE;  Surgeon: Rolm Bookbinder, MD;  Location: [place];  Service: General;  Laterality: N/A;  . Colostomy N/A 03/30/2016    Procedure: COLOSTOMY;  Surgeon: Rolm Bookbinder, MD;  Location: [place];  Service: General;  Laterality: N/A;  . Colostomy revision N/A 03/30/2016    Procedure: COLON RESECTION LEFT;  Surgeon: Rolm Bookbinder, MD;  Location: [place];  Service: General;  Laterality: N/A;  . Laparotomy N/A 04/02/2016    Procedure: Re-exploration of open abdomen, application of abdominal wound vac;  Surgeon: Greer Pickerel, MD;  Location:  [facility];  Service: General;  Laterality: N/A;  . Application of wound vac N/A 04/02/2016    Procedure: application of wound vac+;  Surgeon: Greer Pickerel, MD;  Location: [facility];  Service: General;  Laterality: N/A;  . Laparotomy N/A 04/04/2016    Procedure: EXPLORATORY LAPAROTOMY, PLACEMENT OF ABRA ABDOMINAL WALL CLOSURE SET ;  Surgeon: Greer Pickerel, MD;  Location: [facility];  Service: General;  Laterality: N/A;  . Laparotomy N/A 04/18/2016    Procedure: EXPLORATORY LAPAROTOMY;  Surgeon: Erroll Luna, MD;  Location: [facility] [place];  Service: General;  Laterality: N/A;  . Abdominal wall defect repair N/A 04/18/2016     Procedure: CLOSURE OF ABDOMEN;  Surgeon: Erroll Luna, MD;  Location: [place];  Service: General;  Laterality: N/A;    Assessment / Plan / Recommendation Clinical Impression   [name] is a 60 year old female with history of COPD with OSA, morbid obesity, vitamin B 12 deficiency, perforated diverticulitis s/p sigmoid colectomy with colostomy in [place], [place] on 03/20 who was discharged to home in [place], [place]. At follow up appointment for staple removal, she was found to have feculent discharge and was admitted via ED on 03/28/16. 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 Hulen Skains on 04/05. Abdomen kept open with wound VAC required multiple procedures including ABRA device to help with final wound closure on 04/18/16. She continues to have retention sutures as well as evidence of breakdown and wet to dry dressing to prior ostomy site. Septic shock resolved and she required tracheostomy due to inability to tolerate vent wean. She is tolerating PMSV And was started on regular diet on 05/04. CCM recommends leaving cuffed #6 in place until ready for decannulation--they will follow weekly for input.  Pt participated with assessment of speech, language and cognition as well as swallowing. Pt performed WNL in all areas. Recommend: Continue with Regular diet and PMSV use during waking hours. No further SLP services indicated. Pt and spouse verbalize agreement.   Skilled Therapeutic Interventions          Pt participated in the [facility] Cognitive Assessment (MoCA 7.2) which she scored a 26/30 possible points which is consistent with normal cognitive-linguistic function. Pt demonstrated ability to don/doff PMSV independently and is 100% intelligible with conversational speech. Pt managed all PO presented WNL. Pt and spouse agree that pt is at baseline  function for speech, language, cognition and swallowing ability.   SLP Assessment  Patient does not need any further Speech Lanaguage Pathology Services    Recommendations  Patient may use Passy-Muir Speech Valve: During all waking hours (remove during sleep) SLP Diet Recommendations: Age appropriate regular solids Liquid Administration via: Cup;Straw Medication Administration: Whole meds with liquid Supervision: Patient able to self feed Postural Changes and/or Swallow Maneuvers: Seated upright 90 degrees;Upright 30-60 min after meal Oral Care Recommendations: Oral care BID Patient destination: Home Follow up Recommendations: None Equipment Recommended: None recommended by SLP    SLP Frequency     SLP Duration  SLP Intensity  SLP Treatment/Interventions            Pain Pain Assessment Pain Assessment: 0-10 Pain Score: 5  Pain Type: Surgical pain Pain Location: Abdomen Pain Onset: Gradual Pain Intervention(s): RN made aware  Prior Functioning Cognitive/Linguistic Baseline: Within functional limits Type of Home: House  Lives With: Spouse Available Help at Discharge:  Family;Available PRN/intermittently  Function:  Eating Eating   Modified Consistency Diet: No Eating Assist Level: Set up assist for   Eating Set Up Assist For: Opening containers       Cognition Comprehension Comprehension assist level: Follows complex conversation/direction with no assist  Expression   Expression assist level: Expresses complex ideas: With no assist  Social Interaction Social Interaction assist level: Interacts appropriately with others - No medications needed.  Problem Solving Problem solving assist level: Solves complex problems: Recognizes & self-corrects  Memory Memory assist level: Complete Independence: No helper    Refer to Care Plan for Long Term Goals  Recommendations for other services: None  Discharge Criteria: Patient will be discharged from SLP if patient  refuses treatment 3 consecutive times without medical reason, if treatment goals not met, if there is a change in medical status, if patient makes no progress towards goals or if patient is discharged from hospital.  The above assessment, treatment plan, treatment alternatives and goals were discussed and mutually agreed upon: by patient and by family  Vinetta Bergamo Dante, [place] 05/03/2016, 12:38 PM

## 2016-05-03 NOTE — Consult Note (Signed)
WOC ostomy follow up Stoma type/location: LLQ end colostomy Stomal assessment/size: 1 3/4" x 1 3/8" oval shaped, budded, pink, moist, os points downward Peristomal assessment: intact  Treatment options for stomal/peristomal skin: using 2" skin barrier to aid in seal Output formed brown stool Ostomy pouching: 1pc.with 2" barrier ring Education provided: demonstrated pouch change to patient today, husband not in the room. Patient more attentive to ostomy care today.  Enrolled patient in [place] Start Discharge program: Yes Additional supplies ordered for room Will need OT to work with Patient on emptying pouch, today I have placed pouch downward in order to facilitate more participation in her care.   WOC will follow along with you for continued support with ostomy teaching and care [facility] RN,CWOCN [id number]

## 2016-05-03 NOTE — Progress Notes (Signed)
Patient A/O, no noted distress. Administered pain regimen, effective see emar. Dressing intact, dressing changed every other day (due Friday). Tolerated meds well. No noted breathing or swallowing issues. Patient sat up in chair today, participated in therapy. Staff will continue to monitor and meet needs.

## 2016-05-03 NOTE — Progress Notes (Signed)
VASCULAR LAB PRELIMINARY  PRELIMINARY  PRELIMINARY  PRELIMINARY  Bilateral lower extremity venous duplex completed.    Preliminary report:  Bilateral:  No evidence of DVT, superficial thrombosis, or Baker's Cyst.   Indyah Saulnier, RVS 05/03/2016, 4:06 PM

## 2016-05-03 NOTE — Evaluation (Signed)
Physical Therapy Assessment and Plan  Patient Details  Name: Christina Castaneda MRN: [id number] Date of Birth: 1956-05-01  PT Diagnosis: Difficulty walking, Impaired sensation, Muscle weakness and Pain in abdomen Rehab Potential: Good ELOS: 21-28 days   Today's Date: 05/03/2016 PT Individual Time: 5361-4431 PT Individual Time Calculation (min): 81 min    Problem List:  Patient Active Problem List   Diagnosis Date Noted  . Debility 05/02/2016  . Edema   . Generalized weakness   . Chronic pain syndrome   . Hypoalbuminemia due to protein-calorie malnutrition ([place])   . OSA (obstructive sleep apnea)   . Morbid obesity due to excess calories ([place])   . S/P colostomy ([place])   . Tracheostomy status ([place])   . Tracheostomy care ([place])   . Chronic obstructive pulmonary disease ([place])   . Tobacco abuse   . Essential hypertension   . Bilateral knee pain   . Dysphagia   . Acute blood loss anemia   . Hyponatremia   . Thyroid activity decreased   . Acute respiratory failure ([place])   . Pressure ulcer 04/01/2016  . 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 Ventilator dependence ([place]) post exp lap with open abd 03/29/2016  . Hypothyroid 03/29/2016  . Morbid obesity ([place]) 03/29/2016  . Open wound of abdomen 03/29/2016  . Infection of colostomy stoma ([place]) 03/28/2016  . 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 Obesity 11/12/2012    Past Medical History:  Past Medical History  Diagnosis Date  . Goiter   . Hypothyroidism   . Asthma     mild  . Hypertension   . Atypical endometrial hyperplasia   . Heart murmur   . Vitamin B12 deficiency   . Yeast infection     for last 3 weeks  . Gout   . COPD (chronic obstructive pulmonary disease) ([place])    Past Surgical History:  Past Surgical History  Procedure Laterality Date  . Neck surgery  1994    repair disk  . Tonsillectomy      as a child  . Cholecystectomy  2009    gallstone removed  . Robotic assisted total hysterectomy with bilateral  salpingo oopherectomy  11/18/2012    Procedure: ROBOTIC ASSISTED TOTAL HYSTERECTOMY WITH BILATERAL SALPINGO OOPHORECTOMY;  Surgeon: Imagene Gurney A. Alycia Rossetti, MD;  Location: [facility];  Service: Gynecology;  Laterality: N/A;  . Colostomy revision N/A 03/28/2016    Procedure: COLOSTOMY REVISION;  Surgeon: Judeth Horn, MD;  Location: [place];  Service: General;  Laterality: N/A;  . Laparotomy N/A 03/28/2016    Procedure: EXPLORATORY LAPAROTOMY;  Surgeon: Judeth Horn, MD;  Location: [place];  Service: General;  Laterality: N/A;  . Omentectomy N/A 03/28/2016    Procedure: OMENTECTOMY;  Surgeon: Judeth Horn, MD;  Location: [place];  Service: General;  Laterality: N/A;  . Wound debridement N/A 03/28/2016    Procedure: DEBRIDEMENT WOUND;  Surgeon: Judeth Horn, MD;  Location: [place];  Service: General;  Laterality: N/A;  . Vacuum assisted closure change N/A 03/30/2016    Procedure: ABDOMINAL VAC CHANGE;  Surgeon: Rolm Bookbinder, MD;  Location: [place];  Service: General;  Laterality: N/A;  . Colostomy N/A 03/30/2016    Procedure: COLOSTOMY;  Surgeon: Rolm Bookbinder, MD;  Location: [place];  Service: General;  Laterality: N/A;  . Colostomy revision N/A 03/30/2016    Procedure: COLON RESECTION LEFT;  Surgeon: Rolm Bookbinder, MD;  Location: [place];  Service: General;  Laterality: N/A;  . Laparotomy N/A 04/02/2016    Procedure: Re-exploration  of open abdomen, application of abdominal wound vac;  Surgeon: Greer Pickerel, MD;  Location: [place];  Service: General;  Laterality: N/A;  . Application of wound vac N/A 04/02/2016    Procedure: application of wound vac+;  Surgeon: Greer Pickerel, MD;  Location: [place];  Service: General;  Laterality: N/A;  . Laparotomy N/A 04/04/2016    Procedure: EXPLORATORY LAPAROTOMY, PLACEMENT OF ABRA ABDOMINAL WALL CLOSURE SET ;  Surgeon: Greer Pickerel, MD;  Location: [place];  Service: General;  Laterality: N/A;  . Laparotomy N/A 04/18/2016    Procedure: EXPLORATORY LAPAROTOMY;  Surgeon: Erroll Luna, MD;  Location: [place];  Service: General;  Laterality: N/A;  . Abdominal wall defect repair N/A 04/18/2016    Procedure: CLOSURE OF ABDOMEN;  Surgeon: Erroll Luna, MD;  Location: [place];  Service: General;  Laterality: N/A;    Assessment & Plan Clinical Impression: Patient is a 60 y.o. year old female with recent admission to the hospital with history of COPD with OSA, morbid obesity, vitamin B 12 deficiency, perforated diverticulitis s/p sigmoid colectomy with colostomy in [place], [place] on 03/20 who was discharged to home in [place], [place]. At follow up appointment for staple removal, she was found to have feculent discharge and was admitted via ED on 03/28/16. 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 Hulen Skains on 04/05. Abdomen kept open with wound VAC required multiple procedures including ABRA device to help with final wound closure on 04/18/16. She continues to have retention sutures as well as evidence of breakdown and wet to dry dressing to prior ostomy site. Septic shock resolved and she required tracheostomy due to inability to tolerate vent wean. She is tolerating PMSV And was started on regular diet on 05/04. CCM recommends leaving cuffed #6 in place until ready for decannulation--they will follow weekly for input. Cortrack removed on 05/05 and patient encouraged to increase po intake to help with healing. Therapy ongoing and patient noted to be severely deconditioned and working on pre-gait activity.  Patient transferred to CIR on 05/02/2016 .   Patient currently requires mod +2 with mobility secondary to muscle weakness, decreased cardiorespiratoy endurance and decreased oxygen support and decreased sitting balance, decreased standing balance and decreased balance strategies.  Prior to hospitalization, patient was independent  with mobility and lived  with Spouse in a House home.  Home access is 2Stairs to enter.  Patient will benefit from skilled PT intervention to maximize safe functional mobility, minimize fall risk and decrease caregiver burden for planned discharge home with intermittent assist.  Anticipate patient will benefit from follow up [facility] at discharge.  PT - End of Session Activity Tolerance: Decreased this session Endurance Deficit: Yes Endurance Deficit Description: frequent rest breaks needed; difficulty tolerating full session PT Assessment Rehab Potential (ACUTE/IP ONLY): Good Barriers to Discharge: Inaccessible home environment;Decreased caregiver support PT Patient demonstrates impairments in the following area(s): Balance;Behavior;Endurance;Motor;Pain;Sensory;Skin Integrity PT Transfers Functional Problem(s): Bed Mobility;Car;Bed to Chair;Furniture PT Locomotion Functional Problem(s): Ambulation;Stairs;Wheelchair Mobility PT Plan PT Intensity: Minimum of 1-2 x/day ,45 to 90 minutes PT Frequency: 5 out of 7 days (may need 15/7 schedule; continue to monitor) PT Duration Estimated Length of Stay: 21-28 days PT Treatment/Interventions: Ambulation/gait training;Balance/vestibular training;Community reintegration;Discharge planning;Disease management/prevention;DME/adaptive equipment instruction;Functional mobility training;Neuromuscular re-education;Pain management;Patient/family education;Psychosocial support;Skin care/wound management;Splinting/orthotics;Stair training;Therapeutic Activities;Therapeutic Exercise;UE/LE Strength taining/ROM;UE/LE Coordination activities;Wheelchair propulsion/positioning PT Transfers Anticipated Outcome(s):  supervision basic; min assist car PT Locomotion Anticipated Outcome(s): supervision household distances; w/c for long distances PT Recommendation Follow Up Recommendations: Home health PT Patient destination: Home Equipment Recommended: Rolling walker with 5" wheels;Wheelchair  (measurements);Wheelchair cushion (measurements)  Skilled Therapeutic Intervention Individual treatment initiated with focus on PT goals, orientation to rehab, bed mobility, overall endurance, transfers, standing tolerance, and seated therex. Pt able to roll in bed with supervision using bed rails for support. Attempted to don abdominal binder, but with discussion with RN, unable to don safely without covering ostomy site. Attempted to contact PA Pam Love but she was not available at this time. Used Stedy for sit <> stand and transfer OOB to w/c with cues for hand placement and upright posture. In parallel bars, pt able to come to standing position with 2 attempts and mod assist; limited tolerance in standing (about 15 seconds) before requiring descent to w/c - +2 present for safety as well as for confidence of patient. Vitals remained WFL during activity. Rest breaks needed throughout due to low activity tolerance and anxiety. Pt attempted to propel w/c x 15' with max assist and cues for improved upright posture to aid with positioning to reach wheels. Seated in w/c pt performed heel/toe raises, LAQ (x 3 reps each), and modified marches x 10 reps each BLE with extended rest breaks between exercises. Encouraged to sit up in w/c at least another 30 min to increase respiratory function and overall endurance. Pt agreeable.  PT Evaluation Precautions/Restrictions Precautions Precautions: Fall;Other (comment) Precaution Comments: 2L on trach collar; PMSV all waking hours Required Braces or Orthoses: Other Brace/Splint Other Brace/Splint: abdominal binder though unable to don safely on eval due to ostomy  Restrictions Weight Bearing Restrictions: No  Pain Pain Assessment Pain Assessment: 0-10 Pain Score: 5  Pain Type: Surgical pain Pain Location: Abdomen Pain Onset: Gradual Pain Intervention(s): RN made aware   Home Living/Prior Functioning Home Living Available Help at Discharge:  Family;Available PRN/intermittently Type of Home: House Home Access: Stairs to enter [employer] of Steps: 2 Entrance Stairs-Rails: Right Home Layout: Two level;Laundry or work area in basement;Able to live on main level with bedroom/bathroom Additional Comments: reports a w/c will not fit in house except living room and maybe hallway  Lives With: Spouse Prior Function Level of Independence: Independent with basic ADLs;Independent with gait  Able to Take Stairs?: Yes Driving: Yes 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, 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    Cognition Overall Cognitive Status: Within Functional Limits for tasks assessed Arousal/Alertness: Awake/alert Orientation Level: Oriented X4 Memory: Appears intact Awareness: Appears intact Problem Solving: Appears intact Safety/Judgment: Appears intact Comments: very anxious during evaluation Sensation Sensation Light Touch: Impaired Detail Light Touch Impaired Details: Impaired LLE (reports numbness LLE- present PTA) Coordination Gross Motor Movements are Fluid and Coordinated: No Coordination and Movement Description: body habitus and generalized debility limiting Motor  Motor Motor: Other (comment) Motor - Skilled Clinical Observations: significant debility     Trunk/Postural Assessment  Cervical Assessment Cervical Assessment: Within Functional Limits Thoracic Assessment Thoracic Assessment: Exceptions to [facility] (slumped posture) Lumbar Assessment Lumbar Assessment: Exceptions to [facility] (posterior pelvic tilt) Postural Control Postural Control: Within Functional Limits  Balance Static Sitting Balance Static Sitting - Level of Assistance: 5: Stand by assistance Dynamic Sitting Balance Dynamic Sitting - Level of Assistance: 5: Stand by assistance Static Standing Balance Static Standing - Level of Assistance: 3: Mod assist Dynamic Standing Balance Dynamic Standing - Level of Assistance: 1: +2  Total  assist Extremity Assessment   see OT eval for details   RLE Assessment RLE Assessment: Exceptions to [facility] (grossly 3/5; poor muscular endurance) LLE Assessment LLE Assessment: Exceptions to [facility] (grossly 3-/5; poor muscular endurance)   See Function Navigator for Current Functional Status.   Refer to Care Plan for Long Term Goals  Recommendations for other services: None  Discharge Criteria: Patient will be discharged from PT if patient refuses treatment 3 consecutive times without medical reason, if treatment goals not met, if there is a change in medical status, if patient makes no progress towards goals or if patient is discharged from hospital.  The above assessment, treatment plan, treatment alternatives and goals were discussed and mutually agreed upon: by patient  Juanna Cao, PT, DPT  05/03/2016, 2:48 PM

## 2016-05-04 ENCOUNTER — Inpatient Hospital Stay (HOSPITAL_COMMUNITY): Payer: BLUE CROSS/BLUE SHIELD

## 2016-05-04 ENCOUNTER — Inpatient Hospital Stay (HOSPITAL_COMMUNITY): Payer: BLUE CROSS/BLUE SHIELD | Admitting: Occupational Therapy

## 2016-05-04 DIAGNOSIS — K632 Fistula of intestine: Secondary | ICD-10-CM

## 2016-05-04 LAB — GLUCOSE, CAPILLARY
GLUCOSE-CAPILLARY: 81 mg/dL (ref 65–99)
GLUCOSE-CAPILLARY: 77 mg/dL (ref 65–99)
Glucose-Capillary: 90 mg/dL (ref 65–99)
Glucose-Capillary: 77 mg/dL (ref 65–99)

## 2016-05-04 MED ORDER — OXYCODONE HCL 5 MG PO TABS
5.0000 mg | ORAL_TABLET | ORAL | Status: DC | PRN
  Administered 2016-05-05 (×2): 10 mg via ORAL
  Administered 2016-05-05: 5 mg via ORAL
  Administered 2016-05-05 – 2016-05-07 (×8): 10 mg via ORAL
  Administered 2016-05-08 (×3): 5 mg via ORAL
  Administered 2016-05-08 – 2016-05-09 (×4): 10 mg via ORAL
  Administered 2016-05-09: 5 mg via ORAL
  Administered 2016-05-09 – 2016-05-12 (×10): 10 mg via ORAL
  Administered 2016-05-13 (×4): 5 mg via ORAL
  Administered 2016-05-14: 10 mg via ORAL
  Administered 2016-05-14: 5 mg via ORAL
  Administered 2016-05-14: 10 mg via ORAL
  Administered 2016-05-14: 5 mg via ORAL
  Administered 2016-05-15 – 2016-05-16 (×4): 10 mg via ORAL
  Administered 2016-05-16: 5 mg via ORAL
  Administered 2016-05-16: 10 mg via ORAL
  Administered 2016-05-16: 5 mg via ORAL
  Administered 2016-05-17: 10 mg via ORAL
  Administered 2016-05-17: 5 mg via ORAL
  Administered 2016-05-17 (×2): 10 mg via ORAL
  Administered 2016-05-18: 5 mg via ORAL
  Administered 2016-05-18: 10 mg via ORAL
  Administered 2016-05-18: 5 mg via ORAL
  Administered 2016-05-19 (×2): 10 mg via ORAL
  Administered 2016-05-20 – 2016-05-23 (×9): 5 mg via ORAL
  Filled 2016-05-04: qty 1
  Filled 2016-05-04: qty 2
  Filled 2016-05-04 (×4): qty 1
  Filled 2016-05-04 (×8): qty 2
  Filled 2016-05-04: qty 1
  Filled 2016-05-04 (×3): qty 2
  Filled 2016-05-04: qty 1
  Filled 2016-05-04 (×4): qty 2
  Filled 2016-05-04 (×2): qty 1
  Filled 2016-05-04 (×5): qty 2
  Filled 2016-05-04: qty 1
  Filled 2016-05-04 (×9): qty 2
  Filled 2016-05-04: qty 1
  Filled 2016-05-04 (×2): qty 2
  Filled 2016-05-04: qty 1
  Filled 2016-05-04 (×3): qty 2
  Filled 2016-05-04 (×4): qty 1
  Filled 2016-05-04 (×5): qty 2
  Filled 2016-05-04 (×2): qty 1
  Filled 2016-05-04: qty 2
  Filled 2016-05-04: qty 1
  Filled 2016-05-04: qty 2

## 2016-05-04 NOTE — IPOC Note (Signed)
Overall Plan of Care [facility]) Patient Details Name: Christina Castaneda MRN: [id number] DOB: [birth year]  Admitting Diagnosis: [facility] Problems: Principal Problem:   Debility Active Problems:   Colocutaneous fistula   Tracheostomy status ([place])   Edema   Generalized weakness   Chronic pain syndrome   Hypoalbuminemia due to protein-calorie malnutrition ([facility])   OSA (obstructive sleep apnea)   Morbid obesity due to excess calories ([facility])   S/P colostomy ([place])     Functional Problem List: Nursing Bladder, Bowel, Edema, Endurance, Medication Management, Nutrition, Pain, Safety, Sensory, Skin Integrity  PT Balance, Behavior, Endurance, Motor, Pain, Sensory, Skin Integrity  OT Balance, Endurance, Motor, Sensory  SLP    TR         Basic ADL's: OT Bathing, Dressing, Toileting     Advanced  ADL's: OT Simple Meal Preparation, Light Housekeeping     Transfers: PT Bed Mobility, Car, Bed to Chair, Manufacturing systems engineer, Metallurgist: PT Ambulation, Stairs, Emergency planning/management officer     Additional Impairments: OT None  SLP        TR      Anticipated Outcomes Item Anticipated Outcome  Self Feeding I  Swallowing      Basic self-care  S/ set up bathing, LB dressing  Toileting  mod I    Bathroom Transfers mod I to toilet, S to tub bench in tub  Bowel/Bladder  mod assist  Transfers  supervision basic; min assist car  Locomotion  supervision household distances; w/c for long distances  Communication     Cognition     Pain  <3 on a 0-10 pain scale  Safety/Judgment  mod to max assist   Therapy Plan: PT Intensity: Minimum of 1-2 x/day ,45 to 90 minutes PT Frequency: 5 out of 7 days (may need 15/7 schedule; continue to monitor) PT Duration Estimated Length of Stay: 21-28 days OT Intensity: Minimum of 1-2 x/day, 45 to 90 minutes OT Frequency: 5 out of 7 days OT Duration/Estimated Length of Stay: 21-24 days         Team Interventions: Nursing  Interventions Patient/Family Education, Bladder Management, Bowel Management, Disease Management/Prevention, Pain Management, Medication Management, Skin Care/Wound Management, Discharge Planning, Psychosocial Support  PT interventions Ambulation/gait training, Training and development officer, Community reintegration, Discharge planning, Disease management/prevention, DME/adaptive equipment instruction, Functional mobility training, Neuromuscular re-education, Pain management, Patient/family education, Psychosocial support, Skin care/wound management, Splinting/orthotics, Stair training, Therapeutic Activities, Therapeutic Exercise, UE/LE Strength taining/ROM, UE/LE Coordination activities, Wheelchair propulsion/positioning  OT Interventions Training and development officer, Discharge planning, DME/adaptive equipment instruction, Functional mobility training, Patient/family education, Psychosocial support, Self Care/advanced ADL retraining, Therapeutic Activities, Therapeutic Exercise, UE/LE Strength taining/ROM  SLP Interventions    TR Interventions    SW/CM Interventions Discharge Planning, Psychosocial Support, Patient/Family Education    Team Discharge Planning: Destination: PT-Home ,OT- Home , SLP-Home Projected Follow-up: PT-Home health PT, OT-  Home health OT, SLP-None Projected Equipment Needs: PT-Rolling walker with 5" wheels, Wheelchair (measurements), Wheelchair cushion (measurements), OT- Tub/shower bench, SLP-None recommended by SLP Equipment Details: PT- , OT-has walker, BSC, shower seat Patient/family involved in discharge planning: PT- Patient,  OT-Patient, Family member/caregiver, SLP-Patient, Family member/caregiver  MD ELOS: 24 days Medical Rehab Prognosis:  Excellent Assessment: The patient has been admitted for CIR therapies with the diagnosis of debility after multiple medical. The team will be addressing functional mobility, strength, stamina, balance, safety, adaptive techniques and  equipment, self-care, bowel and bladder mgt, patient and caregiver education, trach care, pain mgt, wound care, ego  support, community reintegration. Goals have been set at mod I to supervision with ADL's and supervision with locomotion and transfers. Will need some assistance with wound care also.    Meredith Staggers, MD, FAAPMR      See Team Conference Notes for weekly updates to the plan of care

## 2016-05-04 NOTE — Progress Notes (Signed)
Physical Therapy Session Note  Patient Details  Name: Christina Castaneda MRN: [id number] Date of Birth: 1956-05-23  Today's Date: 05/04/2016 PT Individual Time: 1405-1500 PT Individual Time Calculation (min): 55 min   Short Term Goals: Week 1:  PT Short Term Goal 1 (Week 1): Pt will be able to perform sit <> stands with mod assist using RW PT Short Term Goal 2 (Week 1): Pt will be able to initiate gait with +2 assist PT Short Term Goal 3 (Week 1): Pt will be able to tolerate OOB x 1 hour between therapy sessions to increase overall endurance/activity tolerance  Skilled Therapeutic Interventions/Progress Updates:    Pt reporting she had a low blood sugar reading around lunch time and had been sleeping since then. Agreeable to therapy session but due to needing dressing change following therapy, needed to remain in bed. Focused on issuing supine therex for BLE strengthening (handout given and reviewed with patient) including ankle pumps, heel slides, hip abduction, glute and quad sets with 5 second hold, and SAQ with 5 second hold x 10 reps each BLE. Bed mobility retraining with cues for log roll technique with min assist to get to EOB and mod assist to return to supine. Using Stedy, instructed in sit <> stands x 3 reps (from modified position) with holding standing x 15 seconds. Pt required max assist from bed and supervision from elevated Stedy. All needs in reach.  Therapy Documentation Precautions:  Precautions Precautions: Fall, Other (comment) Precaution Comments: 2L on trach collar; PMSV all waking hours Required Braces or Orthoses: Other Brace/Splint Other Brace/Splint: abdominal binder loosely over abdominal wound and ostomy site Restrictions Weight Bearing Restrictions: No  Pain: Pain Assessment Pain Assessment: 0-10 Pain Score: 4   Medication given by RN during session  See Function Navigator for Current Functional Status.   Therapy/Group: Individual Therapy  Canary Brim  [name], PT, DPT  05/04/2016, 3:07 PM

## 2016-05-04 NOTE — Progress Notes (Signed)
Occupational Therapy Session Note  Patient Details  Name: Christina Castaneda MRN: [id number] Date of Birth: May 14, 1956  Today's Date: 05/04/2016 OT Individual Time: 0800-0905 OT Individual Time Calculation (min): 65 min    Short Term Goals: Week 1:  OT Short Term Goal 1 (Week 1): Pt will be able to sit to stand from toilet with mod A.  OT Short Term Goal 2 (Week 1): Pt will perform toileting with mod A for clothing management. OT Short Term Goal 3 (Week 1): Pt will don pants with mod A. OT Short Term Goal 4 (Week 1): Pt will tolerate standing at the sink for 1 min.  Skilled Therapeutic Interventions/Progress Updates:    Pt seen for OT ADL bathing/dressing session. OT arrived at 7:30 for scheduled session, however, pt reported that nursing needed to perform dressing change and refused therapy until that time. Therapist arrived at 8:00 following dressing change and pt willing to participate. She transferred to EOB with min A and VCs for technique. She stood from EOB with mod A +1 into STEDY for transfer into w/c. She completed bathing/dressing and grooming tasks seated in w/c at sink. LH sponge provided for LB bathing. +2 required to stand at sink to complete buttock hygiene and pull down gown in the back. PT provided with and educated regarding use of sock aid for LB dressing. Following demonstration, pt able to don sock using sock aid. She required rest breaks throughout task due to decreased activity tolerance. VCs provided for deep breathing techniques. O2 remained >90% throughout session on 5L of supplemental O2.  Pt left sitting in w/c at end of session, all needs in reach, and husband and RN present.  Pt and husband educated throughout session regarding role of OT, OT goals, POC, AE, and d/c planning.  Therapy Documentation Precautions:  Precautions Precautions: Fall, Other (comment) Precaution Comments: 2L on trach collar; PMSV all waking hours Required Braces or Orthoses: Other  Brace/Splint Other Brace/Splint: abdominal binder though unable to don safely on eval due to ostomy  Restrictions Weight Bearing Restrictions: No Pain: Pain Assessment Pain Assessment: 0-10 Pain Score: 4  Faces Pain Scale: Hurts a little bit Pain Type: Surgical pain Pain Location: Abdomen Pain Orientation: Mid Pain Descriptors / Indicators: Aching Pain Frequency: Constant Pain Onset: Gradual Patients Stated Pain Goal: 2 Pain Intervention(s): Medication (See eMAR) ADL: ADL ADL Comments: refer to functional navigator  See Function Navigator for Current Functional Status.   Therapy/Group: Individual Therapy  Lewis, Knight [place] C 05/04/2016, 7:05 AM

## 2016-05-04 NOTE — Progress Notes (Signed)
Physical Therapy Session Note  Patient Details  Name: Christina Castaneda MRN: [id number] Date of Birth: 21-Sep-1956  Today's Date: 05/04/2016 PT Individual Time: 0915-1030 PT Individual Time Calculation (min): 75 min   Short Term Goals: Week 1:  PT Short Term Goal 1 (Week 1): Pt will be able to perform sit <> stands with mod assist using RW PT Short Term Goal 2 (Week 1): Pt will be able to initiate gait with +2 assist PT Short Term Goal 3 (Week 1): Pt will be able to tolerate OOB x 1 hour between therapy sessions to increase overall endurance/activity tolerance  Skilled Therapeutic Interventions/Progress Updates:    Focused on functional sit <> stands, standing tolerance, and pre-gait activities in parallel bars with cues for hand placement, technique, and encouragement. Pt required overall mod assist during sit <> stand attempts and during marches (able to do 6 reps max at a time x 2 sets). As fatigued required more assist. Seated therex for functional strengthening including heel raises, toe raises, and Kinetron on 10 cm/sec x 10 min with rest beaks. Required max assist at end of session with +2 for safety to transfer back to bed due fatigue using Stedy. Supervision sit <> stand from raised position in [place] to get onto the bed. Mod assist to return to supine with cues for technique to decrease strain through abdomen.   Discussed and educated on therapy progression of goals, overall activity tolerance, and modification in schedule. Initially pt stating she didn't understand why we came to work with her several times a day and explained this is to allow rest breaks for her to recover as she could not tolerate 3.5 hours straight. Educated on 15/7 schedule but pt declined and agreeable to try to work through full scheduled day.  Vital remained WFL during session on 2L of supplemental O2  Pt continues to be anxious throughout session requiring frequent encouragement, rest breaks, and cues for deep  breathing.     Therapy Documentation Precautions:  Precautions Precautions: Fall, Other (comment) Precaution Comments: 2L on trach collar; PMSV all waking hours Required Braces or Orthoses: Other Brace/Splint Other Brace/Splint: abdominal binder loosely over abdominal wound and ostomy site Restrictions Weight Bearing Restrictions: No  Pain: C/o pain in abdomen - premedicated.  See Function Navigator for Current Functional Status.   Therapy/Group: Individual Therapy  Canary Brim [name], PT, DPT  05/04/2016, 10:32 AM

## 2016-05-04 NOTE — Progress Notes (Signed)
[facility] PHYSICAL MEDICINE & REHABILITATION     PROGRESS NOTE    Subjective/Complaints: Slept well. Had a pretty good day with therapy yesterday. Was afraid that she was going to fall when up with PT. Definitely anxious  ROS: Pt denies fever, rash/itching, headache, blurred or double vision, nausea, vomiting,   diarrhea, chest pain, shortness of breath, palpitations, dysuria, dizziness, neck or back pain, bleeding, or depression    Objective: Vital Signs: Blood pressure 142/59, pulse 65, temperature 98.1 F (36.7 C), temperature source Oral, resp. rate 18, height 5\' 7"  (1.702 m), weight 130.636 kg (288 lb), last menstrual period 11/18/2012, SpO2 95 %. No results found.  Recent Labs  05/02/16 0300 05/03/16 0445  WBC 8.0 7.0  HGB 7.6* 7.5*  HCT 25.5* 24.6*  PLT 264 264    Recent Labs  05/02/16 0300 05/03/16 0445  NA 136 136  K 4.0 4.1  CL 101 99*  GLUCOSE 90 88  BUN 12 10  CREATININE 0.85 0.79  CALCIUM 8.1* 8.4*   CBG (last 3)   Recent Labs  05/03/16 1631 05/03/16 2056 05/04/16 0644  GLUCAP 98 111* 90    Wt Readings from Last 3 Encounters:  05/04/16 130.636 kg (288 lb)  05/02/16 141 kg (310 lb 13.6 oz)  01/07/14 138.347 kg (305 lb)    Physical Exam:  Constitutional: She is oriented to person, place, and time. She appears well-developed and well-nourished.  Morbidly obese female  HENT:  Head: Normocephalic and atraumatic.  Eyes: Conjunctivae and EOM are normal. Pupils are equal, round, and reactive to light.  Neck:  #6 trach with PMV. Reasonable phonation Cardiovascular: Normal rate and regular rhythm.  No murmur heard. Respiratory: Effort normal. No stridor. She has decreased breath sounds in the right lower field and the left lower field. She has wheezes. She has rhonchi.  GI: Soft. Bowel sounds are normal. She exhibits no distension. There is tenderness.  Ostomy with semi-formed stool. no leak Musculoskeletal: She exhibits edema. She  exhibits no tenderness.  Neurological: She is alert and oriented to person, place, and time.  Motor:  Bilateral upper extremities 4+/5 proximal to distal Bilateral lower extremities hip flexion, knee extension 4-/5, ankle dorsi/plantarflexion 4+/5 Sensation diminished to light touch bilateral feet  Skin: Skin is warm and dry.  Midline abdominal incision with retention sutures as well as dehiscence in between. ABD pad and gauze soaked with serous drainage. Surrounding skin still macerated    Psychiatric: Her speech is normal. Her affect is blunt.  Slightly anxious and irritable  Assessment/Plan: 1. debility secondary to weakness from multiple medical issues/respiratory failure which require 3+ hours per day of interdisciplinary therapy in a comprehensive inpatient rehab setting. Physiatrist is providing close team supervision and 24 hour management of active medical problems listed below. Physiatrist and rehab team continue to assess barriers to discharge/monitor patient progress toward functional and medical goals.  Function:  Bathing Bathing position   Position: Bed  Bathing parts Body parts bathed by patient: Right arm, Left arm, Chest Body parts bathed by helper: Front perineal area, Buttocks, Right upper leg, Left upper leg, Right lower leg, Left lower leg, Back  Bathing assist        Upper Body Dressing/Undressing Upper body dressing   What is the patient wearing?: Hospital gown                Upper body assist        Lower Body Dressing/Undressing Lower body dressing   What is  the patient wearing?: Hospital Gown, Non-skid slipper socks           Non-skid slipper socks- Performed by helper: Don/doff right sock, Don/doff left sock                  Lower body assist        Toileting Toileting          Toileting assist     Transfers Chair/bed transfer     Chair/bed transfer assist level: 2 helpers Chair/bed transfer assistive device:  Mechanical lift Mechanical lift: Stedy   Locomotion Ambulation Ambulation activity did not occur: Safety/medical concerns         Wheelchair   Type: Manual Max wheelchair distance: 15' Assist Level: Maximal assistance (Pt 25 - 49%)  Cognition Comprehension Comprehension assist level: Follows complex conversation/direction with no assist  Expression Expression assist level: Expresses complex ideas: With extra time/assistive device  Social Interaction Social Interaction assist level: Interacts appropriately 90% of the time - Needs monitoring or encouragement for participation or interaction.  Problem Solving Problem solving assist level: Solves complex problems: Recognizes & self-corrects  Memory Memory assist level: Recognizes or recalls 90% of the time/requires cueing < 10% of the time   Medical Problem List and Plan: 1. Weakness, decreased endurance secondary to debility from multiple medical issues  -continue CIR 2. DVT Prophylaxis/Anticoagulation: Pharmaceutical: Lovenox  -remains indicated  -dopplers negative 3. Pain Management: continue Fentanyl patch 100 mcg with oxycodone prn for pain  -states that pain is predominantly abdominal   -having some pain in legs with numbness, likely peripheral neuropathy with stocking glove sensory loss   -reviewed with patient potential causes and treatments. She hopes that it "gets better". 4. Mood: LCSW to follow for evaluation and support.  5. Neuropsych: This patient is capable of making decisions on her own behalf. 6. Skin/Wound Care: Dressing changes--increase to tid to help decrease maceration.   -area fairly clean at present. Continued serous drainage  -ostomy competent  -loose fitting abd binder to help hold things in place with therapy 7. Fluids/Electrolytes/Nutrition: Monitor I/O. Check lytes in am. Renal status stable. Add supplements to help manage low protein stores. 8. ABLA: H/H stable at 7.6-8.0 range.---7.5 today  -  iron deficiency anemia  -B12 normal.   -Fe++ supp 9. COPD/OSA in setting of morbid obesity: Continue ATC- tolerating PMSV. Continue brovana bid and Pulmicort bid.   -continue trach for now. Need to increase mobility/activity toleranceto remove. 10. Hypothyroid: On supplement.  11. Morbid Obesity Body mass index is 50.2 kg/(m^2). Diet and exercise education Will cont to encourage weight loss to increase endurance and promote overall health 12. Tobacco abuse: Continue counseling 13. Bilateral knee pain Will continue to monitor and adjust medications as necessary 14. Hypothyroidism Continue medications  -recheck TSH/ T4 at some point during admission    LOS (Days) 2 A FACE TO FACE EVALUATION WAS PERFORMED  SWARTZ,ZACHARY T 05/04/2016 8:31 AM

## 2016-05-04 NOTE — Care Management Note (Signed)
Inpatient Rehabilitation Center Individual Statement of Services  Patient Name:  Christina Castaneda  Date:  05/04/2016  Welcome to the [facility].  Our goal is to provide you with an individualized program based on your diagnosis and situation, designed to meet your specific needs.  With this comprehensive rehabilitation program, you will be expected to participate in at least 3 hours of rehabilitation therapies Monday-Friday, with modified therapy programming on the weekends.  Your rehabilitation program will include the following services:  Physical Therapy (PT), Occupational Therapy (OT), Speech Therapy (ST), 24 hour per day rehabilitation nursing, Therapeutic Recreaction (TR), Neuropsychology, Case Management (Social Worker), Rehabilitation Medicine, Nutrition Services and Pharmacy Services  Weekly team conferences will be held on Tuesdays to discuss your progress.  Your Social Worker will talk with you frequently to get your input and to update you on team discussions.  Team conferences with you and your family in attendance may also be held.  Expected length of stay: 21-24 days  Overall anticipated outcome: minimal assistance  Depending on your progress and recovery, your program may change. Your Social Worker will coordinate services and will keep you informed of any changes. Your Social Worker's name and contact numbers are listed  below.  The following services may also be recommended but are not provided by the [facility] will be made to provide these services after discharge if needed.  Arrangements include referral to agencies that provide these services.  Your insurance has been verified to be:  BCBs Your primary doctor is:  Neysa Hotter, MD  Pertinent information will be shared with your doctor and  your insurance company.  Social Worker:  [place], [place] or ([phone]   Information discussed with and copy given to patient by: Lennart Pall, 05/04/2016, 1:21 PM

## 2016-05-05 ENCOUNTER — Inpatient Hospital Stay (HOSPITAL_COMMUNITY): Payer: BLUE CROSS/BLUE SHIELD | Admitting: Occupational Therapy

## 2016-05-05 ENCOUNTER — Inpatient Hospital Stay (HOSPITAL_COMMUNITY): Payer: BLUE CROSS/BLUE SHIELD | Admitting: Physical Therapy

## 2016-05-05 DIAGNOSIS — R609 Edema, unspecified: Secondary | ICD-10-CM

## 2016-05-05 LAB — GLUCOSE, CAPILLARY
GLUCOSE-CAPILLARY: 78 mg/dL (ref 65–99)
GLUCOSE-CAPILLARY: 107 mg/dL — AB (ref 65–99)

## 2016-05-05 NOTE — Progress Notes (Signed)
Occupational Therapy Session Note  Patient Details  Name: Christina Castaneda MRN: [id number] Date of Birth: [birth year]  Today's Date: 05/05/2016 OT Individual Time: 0700-0800 OT Individual Time Calculation (min): 60 min    Short Term Goals: Week 1:  OT Short Term Goal 1 (Week 1): Pt will be able to sit to stand from toilet with mod A.  OT Short Term Goal 2 (Week 1): Pt will perform toileting with mod A for clothing management. OT Short Term Goal 3 (Week 1): Pt will don pants with mod A. OT Short Term Goal 4 (Week 1): Pt will tolerate standing at the sink for 1 min.  Skilled Therapeutic Interventions/Progress Updates:    Upon entering the room, pt supine in bed and requesting assistance for placement of meal tray. Pt refused OOB tasks secondary to pain and wishing to eat first. Pt performed supine >sit onto EOB with min A and use of bed rails. Pt seated on EOB for 20 minutes while eating food with supervision for safety with balance and set up A to open containers on tray. Pt then began complaining of increased pain in abdomen and requesting RN in room. RN notified and arrived in room at end of session with medications. Pt declined further activity until all medications received so therapist educated pt on energy conservation for self care tasks. OT provided paper handout as well for pt to review in free time. OT educated pt on specific situations she would encounter and how to perform those tasks by completing tasks without using all of energy. Pt verbalized understanding but education to continue.   Therapy Documentation Precautions:  Precautions Precautions: Fall, Other (comment) Precaution Comments: 2L on trach collar; PMSV all waking hours Required Braces or Orthoses: Other Brace/Splint Other Brace/Splint: abdominal binder loosely over abdominal wound and ostomy site Restrictions Weight Bearing Restrictions: No Vital Signs: Therapy Vitals Temp: 98.2 F (36.8 C) Temp Source:  Oral Pulse Rate: (!) 58 Resp: 18 BP: (!) 143/60 mmHg Patient Position (if appropriate): Lying Oxygen Therapy SpO2: 97 % O2 Device: Tracheostomy Collar O2 Flow Rate (L/min): 5 L/min FiO2 (%): 28 % ADL: ADL ADL Comments: refer to functional navigator Other Treatments:    See Function Navigator for Current Functional Status.   Therapy/Group: Individual Therapy  Phineas Semen 05/05/2016, 7:55 AM

## 2016-05-05 NOTE — Progress Notes (Signed)
[facility] PHYSICAL MEDICINE & REHABILITATION     PROGRESS NOTE    Subjective/Complaints: Therapies going fairly well. Pt asks why sugars dropping down low. A little winded this morning after working with OT.  ROS: Pt denies fever, rash/itching, headache, blurred or double vision, nausea, vomiting,   diarrhea, chest pain, shortness of breath, palpitations, dysuria, dizziness, neck or back pain, bleeding, or depression    Objective: Vital Signs: Blood pressure 143/60, pulse 73, temperature 98.2 F (36.8 C), temperature source Oral, resp. rate 18, height 5\' 7"  (1.702 m), weight 130.636 kg (288 lb), last menstrual period 11/18/2012, SpO2 100 %. No results found.  Recent Labs  05/03/16 0445  WBC 7.0  HGB 7.5*  HCT 24.6*  PLT 264    Recent Labs  05/03/16 0445  NA 136  K 4.1  CL 99*  GLUCOSE 88  BUN 10  CREATININE 0.79  CALCIUM 8.4*   CBG (last 3)   Recent Labs  05/04/16 1637 05/04/16 2047 05/05/16 0732  GLUCAP 81 77 78    Wt Readings from Last 3 Encounters:  05/05/16 130.636 kg (288 lb)  05/02/16 141 kg (310 lb 13.6 oz)  01/07/14 138.347 kg (305 lb)    Physical Exam:  Constitutional: She is oriented to person, place, and time. She appears well-developed and well-nourished.  Morbidly obese female  HENT:  Head: Normocephalic and atraumatic.  Eyes: Conjunctivae and EOM are normal. Pupils are equal, round, and reactive to light.  Neck:  #6 trach with PMV. good phonation Cardiovascular: Normal rate and regular rhythm.  No murmur heard. Respiratory: Effort normal. No stridor. She has decreased breath sounds in the right lower field and the left lower field. She has wheezes. She has rhonchi.  GI: Soft. Bowel sounds are normal. She exhibits no distension. There is tenderness.  Ostomy with semi-formed stool. area dry surrounding Musculoskeletal: She exhibits edema. She exhibits no tenderness.  Neurological: She is alert and oriented to person, place, and  time.  Motor:  Bilateral upper extremities 4+/5 proximal to distal Bilateral lower extremities hip flexion, knee extension 4-/5, ankle dorsi/plantarflexion 4+/5 Sensation diminished to light touch bilateral feet  Skin: Skin is warm and dry.  Midline abdominal incision with retention sutures as well as dehiscence in between. ABD pad and gauze with serous drainage. Surrounding skin still macerated    Psychiatric: Her speech is normal. Her affect is blunt.  Slightly anxious   Assessment/Plan: 1. Debility secondary to weakness from multiple medical issues/respiratory failure which require 3+ hours per day of interdisciplinary therapy in a comprehensive inpatient rehab setting. Physiatrist is providing close team supervision and 24 hour management of active medical problems listed below. Physiatrist and rehab team continue to assess barriers to discharge/monitor patient progress toward functional and medical goals.  Function:  Bathing Bathing position   Position: Wheelchair/chair at sink  Bathing parts Body parts bathed by patient: Right arm, Left arm, Chest, Abdomen, Right upper leg, Left upper leg, Right lower leg, Left lower leg Body parts bathed by helper: Back, Buttocks  Bathing assist Assist Level: 2 helpers      Upper Body Dressing/Undressing Upper body dressing   What is the patient wearing?: Button up shirt, Pull over shirt/dress     Pull over shirt/dress - Perfomed by patient: Thread/unthread right sleeve, Thread/unthread left sleeve, Put head through opening     Button up shirt - Perfomed by helper: Button/unbutton shirt    Upper body assist        Lower Body  Dressing/Undressing Lower body dressing   What is the patient wearing?: Non-skid slipper socks         Non-skid slipper socks- Performed by patient: Don/doff right sock, Don/doff left sock Non-skid slipper socks- Performed by helper: Don/doff right sock, Don/doff left sock                  Lower  body assist Assist for lower body dressing:  (+2 to stand to pull gown down in back)      Toileting Toileting          Toileting assist     Transfers Chair/bed transfer     Chair/bed transfer assist level: Maximal assist (Pt 25 - 49%/lift and lower) Chair/bed transfer assistive device: Mechanical lift Mechanical lift: Stedy   Locomotion Ambulation Ambulation activity did not occur: Safety/medical concerns         Wheelchair   Type: Manual Max wheelchair distance: 15' Assist Level: Maximal assistance (Pt 25 - 49%)  Cognition Comprehension Comprehension assist level: Follows complex conversation/direction with no assist  Expression Expression assist level: Expresses complex ideas: With extra time/assistive device  Social Interaction Social Interaction assist level: Interacts appropriately 90% of the time - Needs monitoring or encouragement for participation or interaction.  Problem Solving Problem solving assist level: Solves complex problems: Recognizes & self-corrects  Memory Memory assist level: Recognizes or recalls 90% of the time/requires cueing < 10% of the time   Medical Problem List and Plan: 1. Weakness, decreased endurance secondary to debility from multiple medical issues  -continue CIR 2. DVT Prophylaxis/Anticoagulation: Pharmaceutical: Lovenox  -remains indicated  -dopplers negative 3. Pain Management: continue Fentanyl patch 100 mcg with oxycodone prn for pain  -states that pain is predominantly abdominal   -having some pain in legs with numbness, likely peripheral neuropathy with stocking glove sensory loss   -reviewed with patient potential causes and treatments. She hopes that it "gets better". 4. Mood: LCSW to follow for evaluation and support.  5. Neuropsych: This patient is capable of making decisions on her own behalf. 6. Skin/Wound Care: Dressing changes--increase to tid to help decrease maceration.   -area fairly clean at present. Continued  serous drainage  -ostomy competent  -loose fitting abd binder to help hold things in place with therapy 7. Fluids/Electrolytes/Nutrition: Monitor I/O. Check lytes in am. Renal status stable. Add supplements to help manage low protein stores. 8. ABLA: H/H stable at 7.6-8.0 range.---7.5 today  - iron deficiency anemia  -B12 normal.   -Fe++ supp  -recheck blood count next week 9. COPD/OSA in setting of morbid obesity: Continue ATC- tolerating PMSV. Continue brovana bid and Pulmicort bid.   -continue trach for now. Need to increase mobility/activity toleranceto remove. 10. Hypothyroid: On supplement.  11. Morbid Obesity Body mass index is 50.2 kg/(m^2). Diet and exercise education Will cont to encourage weight loss to increase endurance and promote overall health   -dc cbg checks/SSI 12. Tobacco abuse: Continue counseling 13. Bilateral knee pain Will continue to monitor and adjust medications as necessary 14. Hypothyroidism Continue medications  -recheck TSH/ T4 at some point during admission    LOS (Days) 3 A FACE TO FACE EVALUATION WAS PERFORMED  SWARTZ,ZACHARY T 05/05/2016 8:52 AM

## 2016-05-05 NOTE — Progress Notes (Signed)
Physical Therapy Session Note  Patient Details  Name: Christina Castaneda MRN: [id number] Date of Birth: Nov 26, 1956  Today's Date: 05/05/2016 PT Individual Time: 1500-1640 PT Individual Time Calculation (min): 100 min   Short Term Goals: Week 1:  PT Short Term Goal 1 (Week 1): Pt will be able to perform sit <> stands with mod assist using RW PT Short Term Goal 2 (Week 1): Pt will be able to initiate gait with +2 assist PT Short Term Goal 3 (Week 1): Pt will be able to tolerate OOB x 1 hour between therapy sessions to increase overall endurance/activity tolerance  Skilled Therapeutic Interventions/Progress Updates:    PT attempted treatment in AM, and noted significant colostomy bag leak at attachment site. RN notify and assessed wound dressing. RN stated that wold be Pt unable to participate in PT until dressing changed. PT left patient with with RN.   PT attempted in see patient in afternoon and Patient received supine in bed. She was agreeable to PT but stated originally that she would not leave her room. Patient performed Roll R and L x 8 Bilaterally without bed rails to don Abdominal binder. Min A from PT with min-mod cues for improved shoulder flexion/protraction to initiate trunk rotation and to improve push through LE to increase pelvic rotation. Patient performed sit>supine with supervision A and heavy use of bed rails to come to sitting EOB. Patient reports mild dizziness with transfer and is noted to have O2 desat to 83%. PT instructed patient in pursed lip breathing and SpO2 increased to >90% in 2 minutes. Patient reported increased dizziness after Oxygen saturation improved, so PT assessed patients BP: 96/50 with patient sitting EOB. BP reassessed after 2 additional minutes 131/98; mild decrease in dizziness noted by patient.  Patient performed sit<>stand with mod A from PT in stedy; BP assessed in standing 131/101. Patient able to remain standing in stedy for 45 seconds. Patient performed  sit<>stand second time to transfer to [facility] in stedy with mod A from PT and min cues for UE placement and controlled descent into WC.   Patient performed sitting balance in WC without back support and 1 UE support to brush teeth and wash face for a total of 8 minutes. Patient required rest break following personal hygiene.  Pt initially declined WC mobility training, stating that using the [place] would be a waste of time, since she would not be using it at home. PT educated patient on the importance of mobility training for improved UE strength and increase Cardiorespiratory endurance, so pt became agreeable to WC mobility. Patient performed WC mobility training for 9ft in hall and 10ft after 5 minute rest break. Supervision A from PT provided as well as min cues for improved shoulder ROM to allow greater force of propulsion and decreased fatigue of UE.   Following WC mobillity patient performed LE therex in WC: seated marches x 8 BLE. LAQ x 8 BLE. Hip abduction against manual resistance from PT x 8 BLE. Mod Cues for improve ROM and increased control of eccentric movement provided by PT.     Stedy transfer to bed with mod A from PT and min cues for proper UE placement and increased push through BLE. Sit>supine transfer performed with max A from PT to manage BLE and heavy use of bed rails. Patient left supine in bed with trade off to RN   Throughout treatment patient remained on 5L/min supplemental O2 via trach collar with SpO2>93% except 1 desat episode following supine to  sit.   Therapy Documentation Precautions:  Precautions Precautions: Fall, Other (comment) Precaution Comments: 2L on trach collar; PMSV all waking hours Required Braces or Orthoses: Other Brace/Splint Other Brace/Splint: abdominal binder loosely over abdominal wound and ostomy site Restrictions Weight Bearing Restrictions: No General:   Vital Signs: Therapy Vitals Temp: 98.1 F (36.7 C) Temp Source: Oral Pulse Rate: 76 Resp:  20 BP: (!) 127/53 mmHg Patient Position (if appropriate): Lying Oxygen Therapy SpO2: 96 % O2 Device: Tracheostomy Collar O2 Flow Rate (L/min): 5 L/min FiO2 (%): 28 %   See Function Navigator for Current Functional Status.   Therapy/Group: Individual Therapy  Christina Castaneda 05/05/2016, 5:53 PM

## 2016-05-06 ENCOUNTER — Inpatient Hospital Stay (HOSPITAL_COMMUNITY): Payer: BLUE CROSS/BLUE SHIELD | Admitting: Physical Therapy

## 2016-05-06 ENCOUNTER — Inpatient Hospital Stay (HOSPITAL_COMMUNITY): Payer: BLUE CROSS/BLUE SHIELD | Admitting: Occupational Therapy

## 2016-05-06 NOTE — Progress Notes (Signed)
Social Work  Social Work Assessment and Plan  Patient Details  Name: Christina Castaneda MRN: [id number] Date of Birth: 1956/05/15  Today's Date: 05/04/2016  Problem List:  Patient Active Problem List   Diagnosis Date Noted  . Debility 05/02/2016  . Edema   . Generalized weakness   . Chronic pain syndrome   . Hypoalbuminemia due to protein-calorie malnutrition ([place])   . OSA (obstructive sleep apnea)   . Morbid obesity due to excess calories ([place])   . S/P colostomy ([place])   . Tracheostomy status ([place])   . Tracheostomy care ([place])   . Chronic obstructive pulmonary disease ([place])   . Tobacco abuse   . Essential hypertension   . Bilateral knee pain   . Dysphagia   . Acute blood loss anemia   . Hyponatremia   . Thyroid activity decreased   . Acute respiratory failure ([place])   . Pressure ulcer 04/01/2016  . 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 Ventilator dependence ([place]) post exp lap with open abd 03/29/2016  . Hypothyroid 03/29/2016  . Morbid obesity ([place]) 03/29/2016  . Open wound of abdomen 03/29/2016  . Infection of colostomy stoma ([place]) 03/28/2016  . 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 Obesity 11/12/2012   Past Medical History:  Past Medical History  Diagnosis Date  . Goiter   . Hypothyroidism   . Asthma     mild  . Hypertension   . Atypical endometrial hyperplasia   . Heart murmur   . Vitamin B12 deficiency   . Yeast infection     for last 3 weeks  . Gout   . COPD (chronic obstructive pulmonary disease) ([place])    Past Surgical History:  Past Surgical History  Procedure Laterality Date  . Neck surgery  1994    repair disk  . Tonsillectomy      as a child  . Cholecystectomy  2009    gallstone removed  . Robotic assisted total hysterectomy with bilateral salpingo oopherectomy  11/18/2012    Procedure: ROBOTIC ASSISTED TOTAL HYSTERECTOMY WITH BILATERAL SALPINGO OOPHORECTOMY;  Surgeon: Imagene Gurney A. Alycia Rossetti, MD;  Location: [facility];  Service: Gynecology;   Laterality: N/A;  . Colostomy revision N/A 03/28/2016    Procedure: COLOSTOMY REVISION;  Surgeon: Judeth Horn, MD;  Location: [place];  Service: General;  Laterality: N/A;  . Laparotomy N/A 03/28/2016    Procedure: EXPLORATORY LAPAROTOMY;  Surgeon: Judeth Horn, MD;  Location: [place];  Service: General;  Laterality: N/A;  . Omentectomy N/A 03/28/2016    Procedure: OMENTECTOMY;  Surgeon: Judeth Horn, MD;  Location: [place];  Service: General;  Laterality: N/A;  . Wound debridement N/A 03/28/2016    Procedure: DEBRIDEMENT WOUND;  Surgeon: Judeth Horn, MD;  Location: [place];  Service: General;  Laterality: N/A;  . Vacuum assisted closure change N/A 03/30/2016    Procedure: ABDOMINAL VAC CHANGE;  Surgeon: Rolm Bookbinder, MD;  Location: [place];  Service: General;  Laterality: N/A;  . Colostomy N/A 03/30/2016    Procedure: COLOSTOMY;  Surgeon: Rolm Bookbinder, MD;  Location: [place];  Service: General;  Laterality: N/A;  . Colostomy revision N/A 03/30/2016    Procedure: COLON RESECTION LEFT;  Surgeon: Rolm Bookbinder, MD;  Location: [place];  Service: General;  Laterality: N/A;  . Laparotomy N/A 04/02/2016    Procedure: Re-exploration of open abdomen, application of abdominal wound vac;  Surgeon: Greer Pickerel, MD;  Location: [place];  Service: General;  Laterality: N/A;  . Application of wound vac N/A 04/02/2016  Procedure: application of wound vac+;  Surgeon: Greer Pickerel, MD;  Location: [place];  Service: General;  Laterality: N/A;  . Laparotomy N/A 04/04/2016    Procedure: EXPLORATORY LAPAROTOMY, PLACEMENT OF ABRA ABDOMINAL WALL CLOSURE SET ;  Surgeon: Greer Pickerel, MD;  Location: [place];  Service: General;  Laterality: N/A;  . Laparotomy N/A 04/18/2016    Procedure: EXPLORATORY LAPAROTOMY;  Surgeon: Erroll Luna, MD;  Location: [place];  Service: General;  Laterality: N/A;  . Abdominal wall defect repair N/A 04/18/2016    Procedure: CLOSURE OF ABDOMEN;  Surgeon: Erroll Luna, MD;  Location: [place];  Service: General;   Laterality: N/A;   Social History:  reports that she has been smoking Cigarettes.  She has a 28 pack-year smoking history. She has never used smokeless tobacco. She reports that she drinks about 0.5 - 1.5 oz of alcohol per week. She reports that she does not use illicit drugs.  Family / Support Systems Marital Status: Married How Long?: 6 yrs ("together x 20) - a 3rd marriage for both Patient Roles: Spouse Spouse/Significant Other: Jonathan Dangel @ [phone] Children: None for either Anticipated Caregiver: husband Ability/Limitations of Caregiver: Husband works FT as Administrator from lunch time on. Has changed his routes to local.  Not doing long distance anymore. Caregiver Availability: Intermittent Family Dynamics: Pt and husband are very encouraging and supportive of one another.  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  Husband has changed to local routes only. Legal Hisotry/Current Legal Issues: None Guardian/Conservator: None - per MD, pt is capable of making decisions on her own behalf   Abuse/Neglect Physical Abuse: Denies Verbal Abuse: Denies Sexual Abuse: Denies Exploitation of patient/patient's resources: Denies Self-Neglect: Denies  Emotional Status Pt's affect, behavior adn adjustment status: Pt lying in bed and reports very fatigued from therapies.  Admits she is experiencing quite a bit of anxiety with therapy sessions over fears she will damage her incisions with movement. She and husband explain that "this whole ordeal has been horrific and we don't want anything to go wrong now that we've gotten this far."  (spouse)  She denies any s/s  of depression, however, will monitor and refer for neuropsychology as indicated. Recent Psychosocial Issues: None Pyschiatric History: None Substance Abuse History: None  Patient / Family Perceptions, Expectations & Goals Pt/Family understanding of illness & functional limitations: Pt and husband able to provide detailed report of pt's medical course beginning in [place] hospital.  They appear to have a very good understanding of the medical issues.  Good understanding of her current functional limitations/ need for CIR. 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   Anticipated changes in roles/activities/participation: Changes to their work assignments.  Husband to provide caregiver support. Pt/family expectations/goals: "I just need to be able to rebuild myself slowly."  [employer]: None Premorbid Home Care/DME Agencies: None Transportation available at discharge: yes Resource referrals recommended: Neuropsychology  Discharge Planning Living Arrangements: Spouse/significant other Support Systems: Spouse/significant other Type of Residence: Private residence Administrator, sports: Multimedia programmer (specify) Nurse, mental health) Financial Resources: Employment Financial Screen Referred: No Living Expenses: Own Money Management: Spouse Does  the patient have any problems obtaining your medications?: No Home Management: pt and spouse share  Patient/Family Preliminary Plans: Pt to return home with husband providing care Social Work Anticipated Follow Up Needs: HH/OP Expected length of stay: 21-24 days  Clinical Impression Very unfortunate woman who has been through a long medical ordeal which began in [place].  Very debilitated and admits much anxiety about damaging her incision with activity which affects tx sessions.  Husband very supportive and involved.  He does work and there are no other supports.  Will follow for support and d/c planning needs.  Joane Postel 05/04/2016, 4:39 PM

## 2016-05-06 NOTE — Progress Notes (Signed)
[place] PHYSICAL MEDICINE & REHABILITATION     PROGRESS NOTE    Subjective/Complaints: Doing ok. Pain controlled. Still gets sob easily  ROS: Pt denies fever, rash/itching, headache, blurred or double vision, nausea, vomiting,   diarrhea, chest pain, shortness of breath, palpitations, dysuria, dizziness, neck or back pain, bleeding, or depression    Objective: Vital Signs: Blood pressure 148/55, pulse 58, temperature 97.4 F (36.3 C), temperature source Oral, resp. rate 20, height 5\' 7"  (1.702 m), weight 130.636 kg (288 lb), last menstrual period 11/18/2012, SpO2 100 %. No results found. No results for input(s): WBC, HGB, HCT, PLT in the last 72 hours. No results for input(s): NA, K, CL, GLUCOSE, BUN, CREATININE, CALCIUM in the last 72 hours.  Invalid input(s): CO CBG (last 3)   Recent Labs  05/04/16 2047 05/05/16 0732 05/05/16 1158  GLUCAP 77 78 107*    Wt Readings from Last 3 Encounters:  05/06/16 130.636 kg (288 lb)  05/02/16 141 kg (310 lb 13.6 oz)  01/07/14 138.347 kg (305 lb)    Physical Exam:  Constitutional: She is oriented to person, place, and time. She appears well-developed and well-nourished.  Morbidly obese female  HENT:  Head: Normocephalic and atraumatic.  Eyes: Conjunctivae and EOM are normal. Pupils are equal, round, and reactive to light.  Neck:  #6 trach with PMV. good phonation Cardiovascular: Normal rate and regular rhythm.  No murmur heard. Respiratory: Effort normal. No stridor. She has decreased breath sounds in the right lower field and the left lower field. She has wheezes. She has rhonchi.  GI: Soft. Bowel sounds are normal. She exhibits no distension. There is tenderness.  Ostomy with semi-formed stool. area dry surrounding Musculoskeletal: She exhibits edema. She exhibits no tenderness.  Neurological: She is alert and oriented to person, place, and time.  Motor:  Bilateral upper extremities 4+/5 proximal to distal Bilateral  lower extremities hip flexion, knee extension 4-/5, ankle dorsi/plantarflexion 4+/5 Sensation diminished to light touch bilateral feet  Skin: Skin is warm and dry.  Midline abdominal incision with retention sutures as well as dehiscence in between. ABD pad and gauze with serous drainage. Surrounding skin still macerated    Psychiatric: Her speech is normal. Her affect is blunt.  Slightly anxious   Assessment/Plan: 1. Debility secondary to weakness from multiple medical issues/respiratory failure which require 3+ hours per day of interdisciplinary therapy in a comprehensive inpatient rehab setting. Physiatrist is providing close team supervision and 24 hour management of active medical problems listed below. Physiatrist and rehab team continue to assess barriers to discharge/monitor patient progress toward functional and medical goals.  Function:  Bathing Bathing position   Position: Wheelchair/chair at sink  Bathing parts Body parts bathed by patient: Right arm, Left arm, Chest, Abdomen, Right upper leg, Left upper leg, Right lower leg, Left lower leg Body parts bathed by helper: Back, Buttocks  Bathing assist Assist Level: 2 helpers      Upper Body Dressing/Undressing Upper body dressing   What is the patient wearing?: Button up shirt, Pull over shirt/dress     Pull over shirt/dress - Perfomed by patient: Thread/unthread right sleeve, Thread/unthread left sleeve, Put head through opening     Button up shirt - Perfomed by helper: Button/unbutton shirt    Upper body assist        Lower Body Dressing/Undressing Lower body dressing   What is the patient wearing?: Non-skid slipper socks         Non-skid slipper socks- Performed by patient:  Don/doff right sock, Don/doff left sock Non-skid slipper socks- Performed by helper: Don/doff right sock, Don/doff left sock                  Lower body assist Assist for lower body dressing:  (+2 to stand to pull gown down in  back)      Toileting Toileting          Toileting assist     Transfers Chair/bed transfer   Chair/bed transfer method: Other Chair/bed transfer assist level: Moderate assist (Pt 50 - 74%/lift or lower) Chair/bed transfer assistive device: Mechanical lift Mechanical lift: Stedy   Locomotion Ambulation Ambulation activity did not occur: Safety/medical concerns         Wheelchair   Type: Manual Max wheelchair distance: 2ft  Assist Level: Supervision or verbal cues  Cognition Comprehension Comprehension assist level: Follows complex conversation/direction with no assist  Expression Expression assist level: Expresses complex ideas: With extra time/assistive device  Social Interaction Social Interaction assist level: Interacts appropriately 90% of the time - Needs monitoring or encouragement for participation or interaction.  Problem Solving Problem solving assist level: Solves complex problems: Recognizes & self-corrects  Memory Memory assist level: Recognizes or recalls 90% of the time/requires cueing < 10% of the time   Medical Problem List and Plan: 1. Weakness, decreased endurance secondary to debility from multiple medical issues  -continue CIR--still with limited activity tolerance 2. DVT Prophylaxis/Anticoagulation: Pharmaceutical: Lovenox  -remains indicated  -dopplers negative 3. Pain Management: continue Fentanyl patch 100 mcg with oxycodone prn for pain  -states that pain is predominantly abdominal   -having some pain in legs with numbness, likely peripheral neuropathy with stocking glove sensory loss   -reviewed with patient potential causes and treatments. She hopes that it "gets better". 4. Mood: LCSW to follow for evaluation and support.  5. Neuropsych: This patient is capable of making decisions on her own behalf. 6. Skin/Wound Care: Dressing changes--increase to tid to help decrease maceration.   -area fairly clean at present. Continued serous  drainage  -ostomy competent  -loose fitting abd binder to help hold things in place with therapy 7. Fluids/Electrolytes/Nutrition: Monitor I/O. Check lytes in am. Renal status stable. Add supplements to help manage low protein stores. 8. ABLA: H/H stable at 7.6-8.0 range.---7.5 today  - iron deficiency anemia  -B12 normal.   -Fe++ supp  -recheck blood count next week 9. COPD/OSA in setting of morbid obesity: Continue ATC- tolerating PMSV. Continue brovana bid and Pulmicort bid.   -continue trach for now. Downsize this week 10. Hypothyroid: On supplement.  11. Morbid Obesity Body mass index is 50.2 kg/(m^2). Diet and exercise education Will cont to encourage weight loss to increase endurance and promote overall health   -dc cbg checks/SSI 12. Tobacco abuse: Continue counseling 13. Bilateral knee pain Will continue to monitor and adjust medications as necessary 14. Hypothyroidism Continue medications  -recheck TSH/ T4 tomorrow    LOS (Days) 4 A FACE TO FACE EVALUATION WAS PERFORMED  SWARTZ,ZACHARY T 05/06/2016 8:26 AM

## 2016-05-06 NOTE — Progress Notes (Signed)
Occupational Therapy Session Note  Patient Details  Name: ROSHUNDRA MADARA MRN: [id number] Date of Birth: [birth year]  Today's Date: 05/06/2016 OT Individual Time:  -   1400-1500  (60 min)      Short Term Goals: Week 1:  OT Short Term Goal 1 (Week 1): Pt will be able to sit to stand from toilet with mod A.  OT Short Term Goal 2 (Week 1): Pt will perform toileting with mod A for clothing management. OT Short Term Goal 3 (Week 1): Pt will don pants with mod A. OT Short Term Goal 4 (Week 1): Pt will tolerate standing at the sink for 1 min.     Skilled Therapeutic Interventions/Progress Updates:    Pt. Lying in bed upon OT arrival.  She did not know she had therapy today; had not seen a schedule and assumed there was no therapy dur to it being Mother's Day. Scheduled was tacked to bulletin board..  Showed ppt the schedule and she agreed to get up.  Used Clarise Cruz plus for transfer. Pt did supine to sit with mod assist; sit to stand with Clarise Cruz with mod assist.  Maintained standing balance in [place]  for 1 minute while positioning her to transition her to wc.  Placed wc at sink and engaged in grooming activities.  Pt remained in wc at end of session.  Her demeanor softened by end of session.    Therapy Documentation Precautions:  Precautions Precautions: Fall, Other (comment) Precaution Comments: 2L on trach collar; PMSV all waking hours Required Braces or Orthoses: Other Brace/Splint Other Brace/Splint: abdominal binder loosely over abdominal wound and ostomy site Restrictions Weight Bearing Restrictions: No    Pain: Pain Assessment Pain Score: 10  ADL: ADL ADL Comments: refer to functional navigator          See Function Navigator for Current Functional Status.   Therapy/Group: Individual Therapy  Lisa Roca 05/06/2016, 4:03 PM

## 2016-05-06 NOTE — Progress Notes (Signed)
Physical Therapy Session Note  Patient Details  Name: Christina Castaneda MRN: [id number] Date of Birth: 14-Aug-1956  Today's Date: 05/06/2016 PT Individual Time: [id number] PT Individual Time Calculation (min): 26 min   Short Term Goals: Week 1:  PT Short Term Goal 1 (Week 1): Pt will be able to perform sit <> stands with mod assist using RW PT Short Term Goal 2 (Week 1): Pt will be able to initiate gait with +2 assist PT Short Term Goal 3 (Week 1): Pt will be able to tolerate OOB x 1 hour between therapy sessions to increase overall endurance/activity tolerance  Skilled Therapeutic Interventions/Progress Updates:    Pt received in bed & agreeable to PT, noting 3/10 abdominal soreness & when offered to reposition pt stated "moving hurts". Pt slightly aggravated at PT arrival, noting fatigue due to sitting up in w/c for 1 hour and just getting back in bed. Pt agreeable to reviewing HEP & completed the following: ankle pumps, short arc quads, glute & quad sets, heel slides, hip abduction slides, and hip adduction squeezes with a pillow & 5 second hold, all 2 sets of 10 reps with rest breaks between each set 2/2 fatigue. Pt requested to be done with therapy after performing exercises 2/2 fatigue. Pt left in bed with all needs within reach.   Therapy Documentation Precautions:  Precautions Precautions: Fall, Other (comment) Precaution Comments: 2L on trach collar; PMSV all waking hours Required Braces or Orthoses: Other Brace/Splint Other Brace/Splint: abdominal binder loosely over abdominal wound and ostomy site Restrictions Weight Bearing Restrictions: No  Pain: Pain Assessment Pain Assessment: 0-10 Pain Score: 3  Pain Location: Abdomen Pain Descriptors / Indicators: Sore Pain Intervention(s): Distraction (via ther ex)   See Function Navigator for Current Functional Status.   Therapy/Group: Individual Therapy  Waunita Schooner 05/06/2016, 2:24 PM

## 2016-05-07 ENCOUNTER — Inpatient Hospital Stay (HOSPITAL_COMMUNITY): Payer: BLUE CROSS/BLUE SHIELD | Admitting: Physical Therapy

## 2016-05-07 ENCOUNTER — Inpatient Hospital Stay (HOSPITAL_COMMUNITY): Payer: BLUE CROSS/BLUE SHIELD

## 2016-05-07 ENCOUNTER — Inpatient Hospital Stay (HOSPITAL_COMMUNITY): Payer: BLUE CROSS/BLUE SHIELD | Admitting: Occupational Therapy

## 2016-05-07 LAB — CBC
HCT: 25.9 % — ABNORMAL LOW (ref 36.0–46.0)
HEMOGLOBIN: 7.6 g/dL — AB (ref 12.0–15.0)
MCH: 27.4 pg (ref 26.0–34.0)
MCHC: 29.3 g/dL — ABNORMAL LOW (ref 30.0–36.0)
MCV: 93.5 fL (ref 78.0–100.0)
Platelets: 232 10*3/uL (ref 150–400)
RBC: 2.77 MIL/uL — AB (ref 3.87–5.11)
RDW: 17.5 % — ABNORMAL HIGH (ref 11.5–15.5)
WBC: 6.2 10*3/uL (ref 4.0–10.5)

## 2016-05-07 LAB — TSH: TSH: 12.772 u[IU]/mL — ABNORMAL HIGH (ref 0.350–4.500)

## 2016-05-07 LAB — T4, FREE: FREE T4: 1.2 ng/dL — AB (ref 0.61–1.12)

## 2016-05-07 MED ORDER — SIMETHICONE 80 MG PO CHEW
160.0000 mg | CHEWABLE_TABLET | Freq: Three times a day (TID) | ORAL | Status: DC
  Administered 2016-05-07 – 2016-05-23 (×63): 160 mg via ORAL
  Filled 2016-05-07 (×62): qty 2

## 2016-05-07 NOTE — Progress Notes (Addendum)
Patient complaining of discomfort due to retention sutures. She still continuous to have copious serous drainage from wound. Contacted Emina PA with general surgery who indicated that sutures will likely need to stay in place for at least 2 months. Did request that CCS follow at least couple of times a week for monitoring wound and for input on any dressing changes during rehab stay.

## 2016-05-07 NOTE — Progress Notes (Signed)
[facility] Progress Note     Subjective: Pt says her drainage has been a bit overwhelming.  It's clear drainage from the wound and retention suture sites.  She's mobilizing more.  She was up to the side of the bed when I saw her today.  Having BM's and flatus.  Urinating well.  Tolerating diet.  Objective: Vital signs in last 24 hours: Temp:  [98 F (36.7 C)-98.7 F (37.1 C)] 98.7 F (37.1 C) (05/15 1421) Pulse Rate:  [59-68] 68 (05/15 1601) Resp:  [17-22] 22 (05/15 1601) BP: (133-144)/(51-54) 133/51 mmHg (05/15 1421) SpO2:  [98 %-100 %] 98 % (05/15 1601) FiO2 (%):  [28 %] 28 % (05/15 1601) Weight:  [279 lb (126.554 kg)] 279 lb (126.554 kg) (05/15 0539) Last BM Date: 05/06/16  Intake/Output from previous day: 05/14 0701 - 05/15 0700 In: 240 [P.O.:240] Out: -  Intake/Output this shift: Total I/O In: 480 [P.O.:480] Out: 175 [Urine:175]  PE: Gen:  Alert, NAD, pleasant Abd: Obese, soft, mild distension, mild tenderness, +BS, no HSM, midline wound/ostomy site mostly clean, serous drainage (dressing just changed so less drainage currently), ulceration around retention sutures are improved.  Lab Results:   Recent Labs  05/07/16 0713  WBC 6.2  HGB 7.6*  HCT 25.9*  PLT 232   BMET No results for input(s): NA, K, CL, CO2, GLUCOSE, BUN, CREATININE, CALCIUM in the last 72 hours. PT/INR No results for input(s): LABPROT, INR in the last 72 hours. CMP     Component Value Date/Time   NA 136 05/03/2016 0445   K 4.1 05/03/2016 0445   CL 99* 05/03/2016 0445   CO2 29 05/03/2016 0445   GLUCOSE 88 05/03/2016 0445   BUN 10 05/03/2016 0445   CREATININE 0.79 05/03/2016 0445   CALCIUM 8.4* 05/03/2016 0445   PROT 6.0* 05/03/2016 0445   ALBUMIN 2.0* 05/03/2016 0445   AST 19 05/03/2016 0445   ALT 15 05/03/2016 0445   ALKPHOS 60 05/03/2016 0445   BILITOT 0.5 05/03/2016 0445   GFRNONAA >60 05/03/2016 0445   GFRAA >60 05/03/2016 0445   Lipase     Component Value  Date/Time   LIPASE 64* 03/28/2016 1705       Studies/Results: No results found.  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 Hulen Skains  Left colectomy and end colostomy (03/30/16)--Dr. Donne Hazel Re-exploration of open abdomen and application of wound vac (04/02/16)--Dr. Redmond Pulling Placement of ABRA abdominal wall closure device (04/05/16)--Dr. Redmond Pulling Removal of ABRA device and closure of abdominal wall (04/18/16)--Dr. Cornett -Drainage likely ascites or third spacing now that she's mobilizing more.  Hopefully this drainage will begin to slow over the next few days.   -Change the dressings TID and more frequently as needed to keep the patients skin dry, can resume foam pads when drainage output is not so high -Will check her wound again on Thursday or Friday if still here.  -Rentention sutures will need to stay in until at least the end of June, this will be left up to Dr. Hulen Skains.   Please call the office to schedule a follow up appointment with Dr. Hulen Skains for 2-3 weeks after discharge.    LOS: 5 days    Nat Christen 05/07/2016, 4:15 PM Pager: [phone]  (7am - 4:30pm  M-F; 7am - 11:30am Sa/Su)

## 2016-05-07 NOTE — Consult Note (Signed)
WOC wound follow up Wound type: surgical Dressing procedure/placement/frequency: WOC had been consulted previously for the skin necrosis related to retention sutures and ABRA elastics.  She continues to have some necrotic tissue and the foam dressings while padded are getting too wet.  She has a lot of drainage. I will switch her to split gauze under the retention rods to see if this works any better.  Hoping that retention sutures can be removed soon.  Will ask rehab docs to work with surgery team.   Culdesac ostomy follow up Stoma type/location: LLQ, end colostomy Output formed brown stool Ostomy pouching: 1pc.changed over the weekend for leakage. She is having lots of gas and formed stool, patient does not empty independently and husband thinks maybe pouch got to full and leaked because of that.  Pouch is intact now and I will bring her some filtered pouches to see if this may help with the amount of gas in her pouch  Enrolled patient in [place] Start Discharge program: Yes  WOC will follow along with you for continued support with ostomy teaching and care [place] [phone]

## 2016-05-07 NOTE — Progress Notes (Signed)
[facility]     PROGRESS NOTE    Subjective/Complaints: Slept ok until "abruptly awoken" by food services person. Wants to know why legs are numb  ROS: Pt denies fever, rash/itching, headache, blurred or double vision, nausea, vomiting,   diarrhea, chest pain, shortness of breath, palpitations, dysuria, dizziness, neck or back pain, bleeding, or depression    Objective: Vital Signs: Blood pressure 144/54, pulse 66, temperature 98 F (36.7 C), temperature source Oral, resp. rate 20, height 5\' 7"  (1.702 m), weight 126.554 kg (279 lb), last menstrual period 11/18/2012, SpO2 100 %. No results found.  Recent Labs  05/07/16 0713  WBC 6.2  HGB 7.6*  HCT 25.9*  PLT 232   No results for input(s): NA, K, CL, GLUCOSE, BUN, CREATININE, CALCIUM in the last 72 hours.  Invalid input(s): CO CBG (last 3)   Recent Labs  05/04/16 2047 05/05/16 0732 05/05/16 1158  GLUCAP 77 78 107*    Wt Readings from Last 3 Encounters:  05/07/16 126.554 kg (279 lb)  05/02/16 141 kg (310 lb 13.6 oz)  01/07/14 138.347 kg (305 lb)    Physical Exam:  Constitutional: She is oriented to person, place, and time. She appears well-developed and well-nourished.  Morbidly obese female  HENT:  Head: Normocephalic and atraumatic.  Eyes: Conjunctivae and EOM are normal. Pupils are equal, round, and reactive to light.  Neck:  #6 trach with PMV. good phonation again Cardiovascular: Normal rate and regular rhythm.  No murmur heard. Respiratory: Effort normal. No stridor. She has decreased breath sounds in the right lower field and the left lower field. She has wheezes. She has rhonchi.  GI: Soft. Bowel sounds are normal. She exhibits no distension. There is tenderness.  Ostomy with  formed stool. area dry surrounding Musculoskeletal: She exhibits edema. She exhibits no tenderness.  Neurological: She is alert and oriented to person, place, and time.  Motor:  Bilateral  upper extremities 4+/5 proximal to distal Bilateral lower extremities hip flexion, knee extension 4-/5, ankle dorsi/plantarflexion 4+/5 Sensation diminished to light touch bilateral feet  Skin: Skin is warm and dry.  Midline abdominal incision with retention sutures as well as dehiscence in between. ABD pad and gauze with continued serous drainage. Surrounding skin still macerated    Psychiatric: Her speech is normal. Her affect is blunt.     Assessment/Plan: 1. Debility secondary to weakness from multiple medical issues/respiratory failure which require 3+ hours per day of interdisciplinary therapy in a comprehensive inpatient rehab setting. Physiatrist is providing close team supervision and 24 hour management of active medical problems listed below. Physiatrist and rehab team continue to assess barriers to discharge/monitor patient progress toward functional and medical goals.  Function:  Bathing Bathing position   Position: Wheelchair/chair at sink  Bathing parts Body parts bathed by patient: Right arm, Left arm, Chest, Abdomen, Right upper leg, Left upper leg, Right lower leg, Left lower leg Body parts bathed by helper: Back, Buttocks  Bathing assist Assist Level: 2 helpers      Upper Body Dressing/Undressing Upper body dressing   What is the patient wearing?: Button up shirt, Pull over shirt/dress     Pull over shirt/dress - Perfomed by patient: Thread/unthread right sleeve, Thread/unthread left sleeve, Put head through opening     Button up shirt - Perfomed by helper: Button/unbutton shirt    Upper body assist        Lower Body Dressing/Undressing Lower body dressing   What is the patient wearing?:  Non-skid slipper socks         Non-skid slipper socks- Performed by patient: Don/doff right sock, Don/doff left sock Non-skid slipper socks- Performed by helper: Don/doff right sock, Don/doff left sock                  Lower body assist Assist for lower body  dressing:  (+2 to stand to pull gown down in back)      Toileting Toileting          Toileting assist     Transfers Chair/bed transfer   Chair/bed transfer method: Other (stedy) Chair/bed transfer assist level: Moderate assist (Pt 50 - 74%/lift or lower) Chair/bed transfer assistive device: Mechanical lift Mechanical lift: Stedy   Locomotion Ambulation Ambulation activity did not occur: Safety/medical concerns         Wheelchair   Type: Manual Max wheelchair distance: 64ft  Assist Level: Supervision or verbal cues  Cognition Comprehension Comprehension assist level: Follows complex conversation/direction with no assist  Expression Expression assist level: Expresses complex ideas: With no assist  Social Interaction Social Interaction assist level: Interacts appropriately 90% of the time - Needs monitoring or encouragement for participation or interaction.  Problem Solving Problem solving assist level: Solves complex problems: Recognizes & self-corrects  Memory Memory assist level: Recognizes or recalls 90% of the time/requires cueing < 10% of the time   Medical Problem List and Plan: 1. Weakness, decreased endurance secondary to debility from multiple medical issues  -continue CIR--still with limited activity tolerance 2. DVT Prophylaxis/Anticoagulation: Pharmaceutical: Lovenox  -remains indicated  -dopplers negative 3. Pain Management: continue Fentanyl patch 100 mcg with oxycodone prn for pain  -states that pain is predominantly abdominal   -having some pain in legs with numbness, likely peripheral neuropathy with stocking glove sensory loss   -reviewed with patient potential causes and treatment yet again today---she doesn't recall [place] discussing before. Was satisfied with my answers. STM deficits 4. Mood: LCSW to follow for evaluation and support.  5. Neuropsych: This patient is capable of making decisions on her own behalf. 6. Skin/Wound Care: Dressing  changes--increase to tid to help decrease maceration.   -area fairly clean at present. Continued serous drainage  -ostomy competent  -loose fitting abd binder to help hold things in place with therapy 7. Fluids/Electrolytes/Nutrition: Monitor I/O. Check lytes in am. Renal status stable. Add supplements to help manage low protein stores. 8. ABLA: H/H stable at 7.6-8.0 range.---7.6 today  - iron deficiency anemia  -B12 normal.   -Fe++ supp  -I personally reviewed the patient's labs today.  9. COPD/OSA in setting of morbid obesity: Continue ATC- tolerating PMSV. Continue brovana bid and Pulmicort bid.   -continue #6 trach for now. Downsize this week? 10. Hypothyroid: On supplement.  11. Morbid Obesity Body mass index is 50.2 kg/(m^2). Diet and exercise education Will cont to encourage weight loss to increase endurance and promote overall health   -dc cbg checks/SSI 12. Tobacco abuse: Continue counseling 13. Bilateral knee pain Will continue to monitor and adjust medications as necessary 14. Hypothyroidism Continue medications  -TSH elevated by free T4 normal/high--no changes    LOS (Days) 5 A FACE TO FACE EVALUATION WAS PERFORMED  Christina Castaneda T 05/07/2016 8:49 AM

## 2016-05-07 NOTE — Progress Notes (Signed)
Occupational Therapy Session Note  Patient Details  Name: Christina Castaneda MRN: [id number] Date of Birth: 1956/02/14  Today's Date: 05/07/2016 OT Individual Time: 1045-1200 OT Individual Time Calculation (min): 75 min    Short Term Goals: Week 1:  OT Short Term Goal 1 (Week 1): Pt will be able to sit to stand from toilet with mod A.  OT Short Term Goal 2 (Week 1): Pt will perform toileting with mod A for clothing management. OT Short Term Goal 3 (Week 1): Pt will don pants with mod A. OT Short Term Goal 4 (Week 1): Pt will tolerate standing at the sink for 1 min.  Skilled Therapeutic Interventions/Progress Updates:    Pt seen for ADL retraining with a focus on activity tolerance and sit to stand. Pt received in w/c stating she was extremely anxious to get back to bed. Was able to convince her to tolerate 20 min to complete B/D at sink. Reviewed stg to stand at sink. Pt convinced she could not do it, but she did stand 2x up to 30 seconds with mod A to come into stand.  Once up she held herself up.  After the 2 stands, pt needed Clarise Cruz lift to get back into bed.  Once in bed, pt needed to urinate and wanted to use bed pan. Pt adjusted in bed, she worked on UE AROM and strengthening with 2lb dowel bar.  Pt in room with all needs met.  Therapy Documentation Precautions:  Precautions Precautions: Fall, Other (comment) Precaution Comments: 2L on trach collar; PMSV all waking hours Required Braces or Orthoses: Other Brace/Splint Other Brace/Splint: abdominal binder loosely over abdominal wound and ostomy site Restrictions Weight Bearing Restrictions: No     Pain: pt c/o pain in abdomen only with certain movements   ADL: ADL ADL Comments: refer to functional navigator  See Function Navigator for Current Functional Status.   Therapy/Group: Individual Therapy  Linton Hall 05/07/2016, 12:21 PM

## 2016-05-07 NOTE — Progress Notes (Signed)
Physical Therapy Session Note  Patient Details  Name: Christina Castaneda MRN: [id number] Date of Birth: [birth year]  Today's Date: 05/07/2016 PT Individual Time: 1545-1615 PT Individual Time Calculation (min): 30 min   Short Term Goals: Week 1:  PT Short Term Goal 1 (Week 1): Pt will be able to perform sit <> stands with mod assist using RW PT Short Term Goal 2 (Week 1): Pt will be able to initiate gait with +2 assist PT Short Term Goal 3 (Week 1): Pt will be able to tolerate OOB x 1 hour between therapy sessions to increase overall endurance/activity tolerance  Skilled Therapeutic Interventions/Progress Updates:    Missed first 15 minutes of session 2/2 pain and pt declining to participate until she'd had pain medication.  Session focus on transfers, activity tolerance, and LE strength.  Pt transitioned supine>sit with supervision using bed rails with HOB flat.  PT instructed pt in seated hip flexion x10 and LAQ with 3 second hold x10 bilaterally.  Sit<>stand in stedy with min assist from elevated bed and pt able to tolerate supported standing in stedy with no UE/single UE/BUE support x10 minutes during breathing treatment.  Attempted 10x sit<>stand from elevated STEDY but pt declined.  Returned to supine with mod assist for LEs and pt positioned to comfort with call bell in reach and needs met.   Therapy Documentation Precautions:  Precautions Precautions: Fall, Other (comment) Precaution Comments: 2L on trach collar; PMSV all waking hours Required Braces or Orthoses: Other Brace/Splint Other Brace/Splint: abdominal binder loosely over abdominal wound and ostomy site Restrictions Weight Bearing Restrictions: No General: PT Amount of Missed Time (min): 15 Minutes PT Missed Treatment Reason: Pain Vital Signs: Therapy Vitals Temp: 98.7 F (37.1 C) Temp Source: Oral Pulse Rate: 68 Resp: (!) 22 BP: (!) 133/51 mmHg Patient Position (if appropriate): Lying Oxygen Therapy SpO2: 98 % O2  Device: Tracheostomy Collar O2 Flow Rate (L/min): 5 L/min FiO2 (%): 28 % Pain:   Mobility:   Locomotion :    Trunk/Postural Assessment :    Balance:   Exercises:   Other Treatments:     See Function Navigator for Current Functional Status.   Therapy/Group: Individual Therapy  Earnest Conroy Penven-Crew 05/07/2016, 4:25 PM

## 2016-05-07 NOTE — Progress Notes (Signed)
Physical Therapy Session Note  Patient Details  Name: Christina Castaneda MRN: [id number] Date of Birth: [birth year]  Today's Date: 05/07/2016 PT Individual Time: 0805-0920 PT Individual Time Calculation (min): 75 min   Short Term Goals: Week 1:  PT Short Term Goal 1 (Week 1): Pt will be able to perform sit <> stands with mod assist using RW PT Short Term Goal 2 (Week 1): Pt will be able to initiate gait with +2 assist PT Short Term Goal 3 (Week 1): Pt will be able to tolerate OOB x 1 hour between therapy sessions to increase overall endurance/activity tolerance  Skilled Therapeutic Interventions/Progress Updates:   Pt reporting having a bad weekend. Eating breakfast and requesting to finish this first. Pt reporting urgent urge to urinate but adamently refusing use of BSC despite education on importance of upright position and OOB for this. Pt continues to decline and ultimately used bed pan with supervision for rolling for placement and removal. Pt receiving breathing treatment by respiratory therapy and awaiting RN to empty ostomy bag - while waiting for this, pt participate in supine therex for functional strengthening including heel slides and ankle pumps  X 10 reps each. Engaged in bed mobility, dynamic sitting balance EOB to don pants with supervision, sit <> stands with Stedy for pulling up pants x 3 reps, and use of Stedy to transfer into w/c. Pt able to perform sit <> stand with mod assist and cues for technique. Pt's husband present during second half of session and providing encouragement. Once in w/c, pt performed oral hygiene and grooming at sink at supervision level for set-up assist and extra time due to rest breaks needed. Pt instructed in w/c mobility training in hallway x bouts of up to 30-50' before rest break needed with cues for technique for turning and efficient propulsion. Pt performed this at supervision level. End of session set up in w/c with all needs in reach and husband at  bedside.   Therapy Documentation Precautions:  Precautions Precautions: Fall, Other (comment) Precaution Comments: 2L on trach collar; PMSV all waking hours Required Braces or Orthoses: Other Brace/Splint Other Brace/Splint: abdominal binder loosely over abdominal wound and ostomy site Restrictions Weight Bearing Restrictions: No   Vital Signs: O2 remained > 98% during session.  Pain:  medication given during session for pain in abdomen and gas pains.  See Function Navigator for Current Functional Status.   Therapy/Group: Individual Therapy  Canary Brim [name], PT, DPT  05/07/2016, 9:30 AM

## 2016-05-08 ENCOUNTER — Inpatient Hospital Stay (HOSPITAL_COMMUNITY): Payer: BLUE CROSS/BLUE SHIELD | Admitting: Occupational Therapy

## 2016-05-08 ENCOUNTER — Inpatient Hospital Stay (HOSPITAL_COMMUNITY): Payer: BLUE CROSS/BLUE SHIELD

## 2016-05-08 ENCOUNTER — Inpatient Hospital Stay (HOSPITAL_COMMUNITY): Payer: BLUE CROSS/BLUE SHIELD | Admitting: *Deleted

## 2016-05-08 MED ORDER — CARBAMIDE PEROXIDE 6.5 % OT SOLN
5.0000 [drp] | Freq: Two times a day (BID) | OTIC | Status: AC
  Administered 2016-05-08 – 2016-05-09 (×3): 5 [drp] via OTIC
  Filled 2016-05-08: qty 15

## 2016-05-08 NOTE — Progress Notes (Signed)
Physical Therapy Session Note  Patient Details  Name: Christina Castaneda MRN: [id number] Date of Birth: 02-11-1956  Today's Date: 05/08/2016 PT Individual Time: 0820-0915 PT Individual Time Calculation (min): 55 min   Short Term Goals: Week 1:  PT Short Term Goal 1 (Week 1): Pt will be able to perform sit <> stands with mod assist using RW PT Short Term Goal 2 (Week 1): Pt will be able to initiate gait with +2 assist PT Short Term Goal 3 (Week 1): Pt will be able to tolerate OOB x 1 hour between therapy sessions to increase overall endurance/activity tolerance  Skilled Therapeutic Interventions/Progress Updates:    Pt missed first 20 min due to requesting time to eat breakfast. Focused on bed mobility, sit <> stands and transfer OOB with Stedy with mod assist to stand, and initiated sit <> Stands with RW from w/c with mod A +2 and cues for technique. Multiple attempts needed but pt able to maintain about 20 seconds once up with min assist x 4 reps total. Pt repots feeling lightheaded - BP taken and vitals WFL. Rest breaks needed between each set where pt completed marching in place seated (unable to try in standing) for LE strengthening. Pt's husband to bring in pt's personal walker. End of session set up in w/c with all needs in reach and husband at bedside.  Therapy Documentation Precautions:  Precautions Precautions: Fall, Other (comment) Precaution Comments: 2L on trach collar; PMSV all waking hours Required Braces or Orthoses: Other Brace/Splint Other Brace/Splint: abdominal binder loosely over abdominal wound and ostomy site Restrictions Weight Bearing Restrictions: No General: PT Amount of Missed Time (min): 20 Minutes PT Missed Treatment Reason: Other (Comment) (Pt requesting to eat breakfast first) Vital Signs: 2L on trach collar with O2 >98% HR = 108 bpm Pain: C/o abdominal pain and gas - premedicated.    See Function Navigator for Current Functional  Status.   Therapy/Group: Individual Therapy and Co-Treatment with TR  Canary Brim [name], PT, DPT  05/08/2016, 9:27 AM

## 2016-05-08 NOTE — Progress Notes (Signed)
Therapy called nurse to room around 1000, reported pt had complaints of dizziness and nausea. Upon entering the room, pt was up in wheelchair leaned over sink with green emesis bag. Pt reported some nausea and felt like her head was "swimming." B/P taken 112/55, HR 103 Assisted therapist with getting patient back to bed. Pt reported feeling hot also, temp readjusted in room and personal fan set up on bedside table for pt convenience. Checked on patient a short while after and patient reports feeling a litter better. Will continue to monitor. Christina Castaneda, Dione Plover

## 2016-05-08 NOTE — Progress Notes (Signed)
Physical Therapy Session Note  Patient Details  Name: Christina Castaneda MRN: [id number] Date of Birth: 1956/08/02  Today's Date: 05/08/2016 PT Individual Time: 1435-1515 PT Individual Time Calculation (min): 40 min   Short Term Goals: Week 1:  PT Short Term Goal 1 (Week 1): Pt will be able to perform sit <> stands with mod assist using RW PT Short Term Goal 2 (Week 1): Pt will be able to initiate gait with +2 assist PT Short Term Goal 3 (Week 1): Pt will be able to tolerate OOB x 1 hour between therapy sessions to increase overall endurance/activity tolerance  Skilled Therapeutic Interventions/Progress Updates:    Pt requesting to stay in room for therapy and declined OOB. Agreeable to therex for UE and LE strengthening on EOB. Pt maintained sitting balance mod I during therex. Instructed in seated LAQ with 3 sec hold with 2# ankle weights, hip abduction and adduction squeezes with 5 second hold, marches with 2# ankle weights, overhead press with 2# straight weight, and bicep curls with 2# straight weights x 10 reps each. Pt declined any further activity and request to return to supine. Min assist to get to supine and supervision to scoot up in bed. All needs in reach. Missed 20 min of session.   Therapy Documentation Precautions:  Precautions Precautions: Fall, Other (comment) Precaution Comments: 2L on trach collar; PMSV all waking hours Required Braces or Orthoses: Other Brace/Splint Other Brace/Splint: abdominal binder loosely over abdominal wound and ostomy site Restrictions Weight Bearing Restrictions: No  Pain: Pain Assessment Pain Assessment: 0-10 Pain Score: 6  Pain Type: Surgical pain Pain Location: Abdomen Pain Descriptors / Indicators: Aching Pain Frequency: Intermittent Pain Onset: Gradual Patients Stated Pain Goal: 4 Pain Intervention(s): Medication (See eMAR)   See Function Navigator for Current Functional Status.   Therapy/Group: Individual Therapy  Canary Brim [name], PT, DPT  05/08/2016, 3:47 PM

## 2016-05-08 NOTE — Progress Notes (Signed)
[place] PHYSICAL MEDICINE & REHABILITATION     PROGRESS NOTE    Subjective/Complaints: Overall doing fairly well. Continued drainage from wound. Still anxious when up with therapies.   ROS: Pt denies fever, rash/itching, headache, blurred or double vision, nausea, vomiting,   diarrhea, chest pain, shortness of breath, palpitations, dysuria, dizziness, neck or back pain, bleeding, or depression    Objective: Vital Signs: Blood pressure 144/64, pulse 56, temperature 98.4 F (36.9 C), temperature source Oral, resp. rate 20, height 5\' 7"  (1.702 m), weight 127.7 kg (281 lb 8.4 oz), last menstrual period 11/18/2012, SpO2 99 %. No results found.  Recent Labs  05/07/16 0713  WBC 6.2  HGB 7.6*  HCT 25.9*  PLT 232   No results for input(s): NA, K, CL, GLUCOSE, BUN, CREATININE, CALCIUM in the last 72 hours.  Invalid input(s): CO CBG (last 3)   Recent Labs  05/05/16 1158  GLUCAP 107*    Wt Readings from Last 3 Encounters:  05/08/16 127.7 kg (281 lb 8.4 oz)  05/02/16 141 kg (310 lb 13.6 oz)  01/07/14 138.347 kg (305 lb)    Physical Exam:  Constitutional: She is oriented to person, place, and time. She appears well-developed and well-nourished.  Morbidly obese female  HENT: ear wax Head: Normocephalic and atraumatic.  Eyes: Conjunctivae and EOM are normal. Pupils are equal, round, and reactive to light.  Neck:  #6 trach with PMV. good phonation again Cardiovascular: Normal rate and regular rhythm.  No murmur heard. Respiratory: Effort normal. No stridor. She has decreased breath sounds in the right lower field and the left lower field. She has wheezes. She has rhonchi.  GI: Soft. Bowel sounds are normal. She exhibits no distension. There is tenderness.  Ostomy with  formed stool. area dry surrounding Musculoskeletal: She exhibits edema. She exhibits no tenderness.  Neurological: She is alert and oriented to person, place, and time.  Motor:  Bilateral upper  extremities 4+/5 proximal to distal Bilateral lower extremities hip flexion, knee extension 4-/5, ankle dorsi/plantarflexion 4+/5 Sensation diminished to light touch bilateral feet  Skin: Skin is warm and dry.  Midline abdominal incision with retention sutures as well as dehiscence in between. ABD pad and gauze with continued serous drainage. Surrounding skin still macerated    Psychiatric: Her speech is normal. Her affect is blunt.     Assessment/Plan: 1. Debility secondary to weakness from multiple medical issues/respiratory failure which require 3+ hours per day of interdisciplinary therapy in a comprehensive inpatient rehab setting. Physiatrist is providing close team supervision and 24 hour management of active medical problems listed below. Physiatrist and rehab team continue to assess barriers to discharge/monitor patient progress toward functional and medical goals.  Function:  Bathing Bathing position   Position: Wheelchair/chair at sink  Bathing parts Body parts bathed by patient: Right arm, Left arm, Chest, Right upper leg, Left upper leg, Right lower leg, Left lower leg, Front perineal area Body parts bathed by helper: Buttocks, Back, Right lower leg, Left lower leg  Bathing assist Assist Level: 2 helpers      Upper Body Dressing/Undressing Upper body dressing   What is the patient wearing?: Pull over shirt/dress     Pull over shirt/dress - Perfomed by patient: Thread/unthread right sleeve, Thread/unthread left sleeve, Put head through opening, Pull shirt over trunk Pull over shirt/dress - Perfomed by helper: Pull shirt over trunk Button up shirt - Perfomed by patient: Thread/unthread right sleeve, Thread/unthread left sleeve, Pull shirt around back Button up shirt -  Perfomed by helper: Button/unbutton shirt    Upper body assist Assist Level:  (patient completed 6/8, 75%, min assist)      Lower Body Dressing/Undressing Lower body dressing   What is the patient  wearing?: Non-skid slipper socks, Pants     Pants- Performed by patient: Thread/unthread right pants leg, Thread/unthread left pants leg Pants- Performed by helper: Pull pants up/down Non-skid slipper socks- Performed by patient: Don/doff right sock, Don/doff left sock Non-skid slipper socks- Performed by helper: Don/doff left sock, Don/doff right sock                  Lower body assist Assist for lower body dressing: Touching or steadying assistance (Pt > 75%) (in standing wtih Stedy to pull up pants)      Toileting Toileting   Toileting steps completed by patient: Performs perineal hygiene Toileting steps completed by helper: Adjust clothing prior to toileting, Adjust clothing after toileting    Toileting assist     Transfers Chair/bed transfer   Chair/bed transfer method: Other (stedy) Chair/bed transfer assist level: Moderate assist (Pt 50 - 74%/lift or lower) Chair/bed transfer assistive device: Mechanical lift Mechanical lift: Stedy   Locomotion Ambulation Ambulation activity did not occur: Safety/medical concerns         Wheelchair   Type: Manual Max wheelchair distance: 89ft  Assist Level: Supervision or verbal cues  Cognition Comprehension Comprehension assist level: Follows complex conversation/direction with no assist  Expression Expression assist level: Expresses complex ideas: With no assist  Social Interaction Social Interaction assist level: Interacts appropriately 90% of the time - Needs monitoring or encouragement for participation or interaction.  Problem Solving Problem solving assist level: Solves complex problems: Recognizes & self-corrects  Memory Memory assist level: Recognizes or recalls 90% of the time/requires cueing < 10% of the time   Medical Problem List and Plan: 1. Weakness, decreased endurance secondary to debility from multiple medical issues  -continue CIR--team conference today 2. DVT Prophylaxis/Anticoagulation: Pharmaceutical:  Lovenox  -remains indicated  -dopplers negative 3. Pain Management: continue Fentanyl patch 100 mcg with oxycodone prn for pain  -states that pain is predominantly abdominal   -having some pain in legs with numbness, likely peripheral neuropathy with stocking glove sensory loss   -reviewed with patient potential causes and treatment yet again today---she doesn't recall [place] discussing before. Was satisfied with my answers. STM deficits 4. Mood: LCSW to follow for evaluation and support.  5. Neuropsych: This patient is capable of making decisions on her own behalf. 6. Skin/Wound Care: Dressing changes--increased to tid  .   -wound dressings per surgery. Continued drainage particularly with increased activity  -ostomy competent  -abd binder to help hold things in place with therapy 7. Fluids/Electrolytes/Nutrition: Monitor I/O. Check lytes in am. Renal status stable. Add supplements to help manage low protein stores. 8. ABLA: H/H stable at 7.6-8.0 range.---7.6 today  - iron deficiency anemia  -B12 normal.   -Fe++ supp  -I personally reviewed the patient's labs today.  9. COPD/OSA in setting of morbid obesity: Continue ATC- tolerating PMSV. Continue brovana bid and Pulmicort bid.   -continue #6 trach for now. Downsize this week? 10. Hypothyroid: On supplement.  11. Morbid Obesity Body mass index is 50.2 kg/(m^2). Diet and exercise education Will cont to encourage weight loss to increase endurance and promote overall health   -dc cbg checks/SSI 12. Tobacco abuse: Continue counseling 13. Bilateral knee pain Will continue to monitor and adjust medications as necessary 14. Hypothyroidism Continue medications  -  TSH elevated by free T4 normal/high--no changes 15. Ear wax---debrox    LOS (Days) 6 A FACE TO FACE EVALUATION WAS PERFORMED  SWARTZ,ZACHARY T 05/08/2016 8:16 AM

## 2016-05-08 NOTE — Progress Notes (Signed)
Recreational Therapy Session Note  Patient Details  Name: Christina Castaneda MRN: [id number] Date of Birth: 05-Nov-1956 Today's Date: 05/08/2016  Order received and chart reviewed.  Met with pt during co-treat with PT.  Pt with limited participation in therapies per team report and little interest in leisure participation at this time.  Full eval deferred, will continue to monitor pt through team for future participation.  [place] 05/08/2016, 12:13 PM

## 2016-05-08 NOTE — Progress Notes (Signed)
Occupational Therapy Session Note  Patient Details  Name: Christina Castaneda MRN: [id number] Date of Birth: [birth year]  Today's Date: 05/08/2016 OT Individual Time: [id number] OT Individual Time Calculation (min): 30 min  OT Missed Time: 30 min  (pt fatigue; low BP)   Short Term Goals: Week 1:  OT Short Term Goal 1 (Week 1): Pt will be able to sit to stand from toilet with mod A.  OT Short Term Goal 2 (Week 1): Pt will perform toileting with mod A for clothing management. OT Short Term Goal 3 (Week 1): Pt will don pants with mod A. OT Short Term Goal 4 (Week 1): Pt will tolerate standing at the sink for 1 min.  Skilled Therapeutic Interventions/Progress Updates:    Pt sitting up in w/c upon arrival, agreeable to tx session. She voiced increased fatigue from previous sessions and is overwhelmed by amount of therapy, however, willing to attempt therapy. Assist required for w/c part management and to propel w/c towards sink. Pt completed face washing task seated in w/c at sink. She began to cough heavily and voiced need to be suctioned. Upon return of getting RN pt with reports of nausea and "swimming" feeling in head. BP 112/55 and HR 103. RN present and requesting pt to return to supine. STEDY used to transfer pt to EOB with +2 required to return to supine and posterior scoot into bed. BP reassessed in supine; 122/64. Pt declined bathing/dressing from supine level and requested to rest in bed. Pt left in supine with all needs in reach.   Educated regarding CIR, OT goals, and importance of mobility and OOB.  Missed 30 min of OT tx time. Will attempt to make up as able.   Therapy Documentation Precautions:  Precautions Precautions: Fall, Other (comment) Precaution Comments: 2L on trach collar; PMSV all waking hours Required Braces or Orthoses: Other Brace/Splint Other Brace/Splint: abdominal binder loosely over abdominal wound and ostomy site Restrictions Weight Bearing Restrictions:  No Pain: Pain Assessment Pain Assessment: 0-10 Pain Score: 3  Pain Type: Surgical pain Pain Location: Abdomen Pain Descriptors / Indicators: Aching Pain Onset: Gradual Pain Intervention(s):Repositioned ADL: ADL ADL Comments: refer to functional navigator  See Function Navigator for Current Functional Status.   Therapy/Group: Individual Therapy  Lewis, Masyn Rostro C 05/08/2016, 7:12 AM

## 2016-05-09 ENCOUNTER — Inpatient Hospital Stay (HOSPITAL_COMMUNITY): Payer: BLUE CROSS/BLUE SHIELD

## 2016-05-09 ENCOUNTER — Inpatient Hospital Stay (HOSPITAL_COMMUNITY): Payer: BLUE CROSS/BLUE SHIELD | Admitting: Occupational Therapy

## 2016-05-09 ENCOUNTER — Inpatient Hospital Stay (HOSPITAL_COMMUNITY): Payer: BLUE CROSS/BLUE SHIELD | Admitting: Physical Therapy

## 2016-05-09 DIAGNOSIS — J9611 Chronic respiratory failure with hypoxia: Secondary | ICD-10-CM

## 2016-05-09 DIAGNOSIS — F4322 Adjustment disorder with anxiety: Secondary | ICD-10-CM

## 2016-05-09 MED ORDER — ALPRAZOLAM 0.5 MG PO TABS
0.5000 mg | ORAL_TABLET | Freq: Two times a day (BID) | ORAL | Status: DC
  Administered 2016-05-09 – 2016-05-23 (×28): 0.5 mg via ORAL
  Filled 2016-05-09 (×29): qty 1

## 2016-05-09 NOTE — Progress Notes (Signed)
[place] PHYSICAL MEDICINE & REHABILITATION     PROGRESS NOTE    Subjective/Complaints: Had a reasonable night. Admits to bouts of anxiety with therapy and at night time too. States she's trying her best though  ROS: Pt denies fever, rash/itching, headache, blurred or double vision, nausea, vomiting,   diarrhea, chest pain, shortness of breath, palpitations, dysuria, dizziness, neck or back pain, bleeding, or depression    Objective: Vital Signs: Blood pressure 124/57, pulse 54, temperature 98.1 F (36.7 C), temperature source Oral, resp. rate 18, height 5\' 7"  (1.702 m), weight 127.914 kg (282 lb), last menstrual period 11/18/2012, SpO2 98 %. No results found.  Recent Labs  05/07/16 0713  WBC 6.2  HGB 7.6*  HCT 25.9*  PLT 232   No results for input(s): NA, K, CL, GLUCOSE, BUN, CREATININE, CALCIUM in the last 72 hours.  Invalid input(s): CO CBG (last 3)  No results for input(s): GLUCAP in the last 72 hours.  Wt Readings from Last 3 Encounters:  05/09/16 127.914 kg (282 lb)  05/02/16 141 kg (310 lb 13.6 oz)  01/07/14 138.347 kg (305 lb)    Physical Exam:  Constitutional: She is oriented to person, place, and time. She appears well-developed and well-nourished.  Morbidly obese female  HENT: ear wax Head: Normocephalic and atraumatic.  Eyes: Conjunctivae and EOM are normal. Pupils are equal, round, and reactive to light.  Neck:  #6 trach with PMV. good phonation   Cardiovascular: Normal rate and regular rhythm.  No murmur heard. Respiratory: Effort normal. No stridor. She has decreased breath sounds in the right lower field and the left lower field. She has wheezes. She has rhonchi.  GI: Soft. Bowel sounds are normal. She exhibits no distension. There is tenderness.  Ostomy with  formed stool. area dry surrounding Musculoskeletal: She exhibits edema. She exhibits no tenderness.  Neurological: She is alert and oriented to person, place, and time.  Motor:   Bilateral upper extremities 4+/5 proximal to distal Bilateral lower extremities hip flexion, knee extension 4-/5, ankle dorsi/plantarflexion 4+/5 Sensation diminished to light touch bilateral feet  Skin: Skin is warm and dry.  Midline abdominal incision with retention sutures/granulation tissue. ABD pad and gauze with continued serous drainage. Dressing saturated    Psychiatric: Her speech is normal. Her affect is appropriate at the moment     Assessment/Plan: 1. Debility secondary to weakness from multiple medical issues/respiratory failure which require 3+ hours per day of interdisciplinary therapy in a comprehensive inpatient rehab setting. Physiatrist is providing close team supervision and 24 hour management of active medical problems listed below. Physiatrist and rehab team continue to assess barriers to discharge/monitor patient progress toward functional and medical goals.  Function:  Bathing Bathing position   Position: Wheelchair/chair at sink  Bathing parts Body parts bathed by patient: Right arm, Left arm, Chest, Right upper leg, Left upper leg, Right lower leg, Left lower leg, Front perineal area Body parts bathed by helper: Buttocks, Back, Right lower leg, Left lower leg  Bathing assist Assist Level: 2 helpers      Upper Body Dressing/Undressing Upper body dressing   What is the patient wearing?: Pull over shirt/dress     Pull over shirt/dress - Perfomed by patient: Thread/unthread right sleeve, Thread/unthread left sleeve, Put head through opening, Pull shirt over trunk Pull over shirt/dress - Perfomed by helper: Pull shirt over trunk Button up shirt - Perfomed by patient: Thread/unthread right sleeve, Thread/unthread left sleeve, Pull shirt around back Button up shirt - Perfomed  by helper: Button/unbutton shirt    Upper body assist Assist Level:  (patient completed 6/8, 75%, min assist)      Lower Body Dressing/Undressing Lower body dressing   What is the  patient wearing?: Non-skid slipper socks, Pants     Pants- Performed by patient: Thread/unthread right pants leg, Thread/unthread left pants leg Pants- Performed by helper: Pull pants up/down Non-skid slipper socks- Performed by patient: Don/doff right sock, Don/doff left sock Non-skid slipper socks- Performed by helper: Don/doff left sock, Don/doff right sock                  Lower body assist Assist for lower body dressing: Touching or steadying assistance (Pt > 75%) (in standing wtih Stedy to pull up pants)      Toileting Toileting   Toileting steps completed by patient: Performs perineal hygiene Toileting steps completed by helper: Adjust clothing prior to toileting, Adjust clothing after toileting    Toileting assist     Transfers Chair/bed transfer   Chair/bed transfer method: Other Chair/bed transfer assist level: Moderate assist (Pt 50 - 74%/lift or lower) Chair/bed transfer assistive device: Mechanical lift Mechanical lift: Stedy   Locomotion Ambulation Ambulation activity did not occur: Safety/medical concerns         Wheelchair   Type: Manual Max wheelchair distance: 68ft  Assist Level: Supervision or verbal cues  Cognition Comprehension Comprehension assist level: Follows complex conversation/direction with no assist  Expression Expression assist level: Expresses complex ideas: With no assist  Social Interaction Social Interaction assist level: Interacts appropriately 90% of the time - Needs monitoring or encouragement for participation or interaction.  Problem Solving Problem solving assist level: Solves complex problems: Recognizes & self-corrects  Memory Memory assist level: Recognizes or recalls 90% of the time/requires cueing < 10% of the time   Medical Problem List and Plan: 1. Weakness, decreased endurance secondary to debility from multiple medical issues  -continue CIR-  -discussed the fact that she will have to work through [place] if  she wants to achieve goals 2. DVT Prophylaxis/Anticoagulation: Pharmaceutical: Lovenox  -remains indicated  -dopplers negative 3. Pain Management: continue Fentanyl patch 100 mcg with oxycodone prn for pain  -states that pain is predominantly abdominal   -having some pain in legs with numbness, likely peripheral neuropathy with stocking glove sensory loss   -reviewed with patient potential causes and treatment yet again today---she doesn't recall [place] discussing before. Was satisfied with my answers. STM deficits 4. Mood: LCSW to follow for evaluation and support.   -will add scheduled xanax to assist with anxiety  5. Neuropsych: This patient is capable of making decisions on her own behalf. 6. Skin/Wound Care: Dressing changes--increased to tid  .   -wound dressings per surgery. Continued drainage particularly with increased activity  -ostomy competent  -abd binder to help hold things in place with therapy 7. Fluids/Electrolytes/Nutrition: Monitor I/O. Check lytes in am. Renal status stable. Add supplements to help manage low protein stores. 8. ABLA: H/H stable at 7.6-8.0 range.---7.6 most recently - iron deficiency anemia  -B12 normal.   -Fe++ supp    9. COPD/OSA in setting of morbid obesity: Continue ATC- tolerating PMSV. Continue brovana bid and Pulmicort bid.   -continue #6 trach for now. Downsize this week? 10. Hypothyroid: On supplement.  11. Morbid Obesity Body mass index is 50.2 kg/(m^2). Diet and exercise education Will cont to encourage weight loss to increase endurance and promote overall health   -dc cbg checks/SSI 12. Tobacco abuse: Continue counseling  13. Bilateral knee pain Will continue to monitor and adjust medications as necessary 14. Hypothyroidism Continue medications  -TSH elevated by free T4 normal/high--no changes 15. Ear wax---debrox has helped    LOS (Days) 7 A FACE TO  FACE EVALUATION WAS PERFORMED  Ordell Prichett T 05/09/2016 8:40 AM

## 2016-05-09 NOTE — Patient Care Conference (Signed)
Inpatient RehabilitationTeam Conference and Plan of Care Update Date: 05/08/2016   Time: 2:00 PM    Patient Name: Christina Castaneda      Medical Record Number: [id number]  Date of Birth: 31-Dec-1955 Sex: Female         Room/Bed: 4W02C/4W02C-01 Payor Info: Payor: [place] / Plan: [employer] OTHER / Product Type: *No Product type* /    Admitting Diagnosis: Debility  Admit Date/Time:  05/02/2016  7:06 PM Admission Comments: No comment available   Primary Diagnosis:  Debility Principal Problem: Debility  Patient Active Problem List   Diagnosis Date Noted  . Debility 05/02/2016  . Edema   . Generalized weakness   . Chronic pain syndrome   . Hypoalbuminemia due to protein-calorie malnutrition ([place])   . OSA (obstructive sleep apnea)   . Morbid obesity due to excess calories ([place])   . S/P colostomy ([place])   . Tracheostomy status ([place])   . Tracheostomy care ([place])   . Chronic obstructive pulmonary disease ([place])   . Tobacco abuse   . Essential hypertension   . Bilateral knee pain   . Dysphagia   . Acute blood loss anemia   . Hyponatremia   . Thyroid activity decreased   . Acute respiratory failure ([place])   . Pressure ulcer 04/01/2016  . 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 Ventilator dependence ([place]) post exp lap with open abd 03/29/2016  . Hypothyroid 03/29/2016  . Morbid obesity ([place]) 03/29/2016  . Open wound of abdomen 03/29/2016  . 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 Obesity 11/12/2012    Expected Discharge Date: Expected Discharge Date: 05/25/16  Team Members Present: Physician leading conference: Dr. Alger Simons Social Worker Present: Lennart Pall, LCSW Nurse Present: Rayetta Pigg, RN PT Present: Canary Brim, PT OT Present: Napoleon Form, OT SLP Present: Weston Anna, SLP PPS Coordinator present : Daiva Nakayama, RN, CRRN     Current Status/Progress Goal Weekly Team Focus  Medical   ongoing major  abdominal wound issues---will need wound mgt for months. anxiety an issues. #6 trach tolerated  improve activity tolerance, optimize wound mgt for activity  wound care, trach, nutrition,    Bowel/Bladder   colostomy with formed stools 5/15, continent of urine-uses bedpan  remain continent, softer stools  monitor urine output & stool consistency   Swallow/Nutrition/ Hydration             ADL's   +2 sit <> stand; mod-max LB dressing; STEDY for functional transfers  Supervision- mod I Overall  sit <> stand; activity tolerance; ADL re-training   Mobility   mod assist sit <> stands with Stedy for transfers or parallel bars; min to mod assist bed mobility; unable to initiate gait at this time  supervision overall for transfers and short distance household gait; mod assist 2 stairs; min assist bed mobility and car transfer  education, endurance, strengthening, transfers, standing tolerance, sit <> stands, balance, pre-gait   Communication             Safety/Cognition/ Behavioral Observations            Pain   requires more frequent pain meds during daytime. Oxy 10 mg q4 h PRN  pain will be less & manageable  assess pain, medicaye prn   Skin   incisions with retention sutures, colostomy, abd folds (interdry0, microguard to breast folds, bottom looks good  prevent pressure sores & infection  monitor skin, keep surgical wounds clean, dressings  changes    Rehab Goals Patient on target to meet rehab goals: No Rehab Goals Revised: Pt remains very anxious with therapies and is her own worst enemy.  Reluctant to do therapy and this can certainly affect meeting her goals. *See Care Plan and progress notes for long and short-term goals.  Barriers to Discharge: size, large wound, anxiety    Possible Resolutions to Barriers:  continue to address mobility, activity tolerance, ?decr trach,     Discharge Planning/Teaching Needs:  Home with husband who does work days.  Need to determine what his plan  is to cover 24/7 care.  TBD   Team Discussion:  Still with drainage at wound;  Memory issues;  Hope to d/c trach prior to d/c.  BP drops with therapies and have not been able to get abdominal binder to work.   Pt putting up constant roadblocks with therapies and anxiety is a major barrier.  Goals had been set for supervision, however, concerning that we may not reach that level.  Currently needing +2 to stand with stedy x 2 seconds.  May need SNF.  Revisions to Treatment Plan:  Not at this time.   Continued Need for Acute Rehabilitation Level of Care: The patient requires daily medical management by a physician with specialized training in physical medicine and rehabilitation for the following conditions: Daily direction of a multidisciplinary physical rehabilitation program to ensure safe treatment while eliciting the highest outcome that is of practical value to the patient.: Yes Daily medical management of patient stability for increased activity during participation in an intensive rehabilitation regime.: Yes Daily analysis of laboratory values and/or radiology reports with any subsequent need for medication adjustment of medical intervention for : Diabetes problems;Post surgical problems;Pulmonary problems;Other;Mood/behavior problems  Quinnley Colasurdo 05/09/2016, 9:46 AM

## 2016-05-09 NOTE — Consult Note (Signed)
Name: Christina Castaneda MRN: [id number] DOB: 01/06/1956    ADMISSION DATE:  05/02/2016 CONSULTATION DATE:  5/17  REFERRING MD :  Naaman Plummer   CHIEF COMPLAINT:  respiratory failure   BRIEF PATIENT DESCRIPTION: 60yo female with hx COPD, OSA, morbid obesity who was initially admitted to [facility] 4/5 with complicated abdomen after a large abd surgery at an outside facility.  She underwent exp lap and multiple follow up surgeries for closure.  She had a prolonged, complicated hospital course including acute respiratory failure requiring tracheostomy placement.  She has been recovering slowly and has since tx to inpatient rehab where she is making some progress with therapy, tolerating passe muir valve.  PCCM now consulted regarding trach plan going forward.   SIGNIFICANT EVENTS  S/p laparotomy, colostomy removal and end stapling of stoma, partial colectomy, omentectomy, debridement of wound (03/29/16)--Dr. Hulen Skains  Left colectomy and end colostomy (03/30/16)--Dr. Donne Hazel Re-exploration of open abdomen and application of wound vac (04/02/16)--Dr. Redmond Pulling Placement of ABRA abdominal wall closure device (04/05/16)--Dr. Redmond Pulling Removal of ABRA device and closure of abdominal wall (04/18/16)--Dr. Cornett  #6 cuffed trach (JY) 4/13>>>  STUDIES:     HISTORY OF PRESENT ILLNESS:  60yo female with hx COPD, OSA, morbid obesity who was initially admitted to [facility] 4/5 with complicated abdomen after a large abd surgery at an outside facility.  She underwent exp lap and multiple follow up surgeries for closure.  She had a prolonged, complicated hospital course including acute respiratory failure requiring tracheostomy placement.  She has been recovering slowly and has since tx to inpatient rehab where she is making some progress with therapy, tolerating passe muir valve.  PCCM now consulted regarding trach plan going forward.   PAST MEDICAL HISTORY :   has a past medical history of Goiter; Hypothyroidism; Asthma;  Hypertension; Atypical endometrial hyperplasia; Heart murmur; Vitamin B12 deficiency; Yeast infection; Gout; and COPD (chronic obstructive pulmonary disease) ([place]).  has past surgical history that includes Neck surgery (1994); Tonsillectomy; Cholecystectomy (2009); Robotic assisted total hysterectomy with bilateral salpingo oophorectomy (11/18/2012); Colostomy revision (N/A, 03/28/2016); laparotomy (N/A, 03/28/2016); Omentectomy (N/A, 03/28/2016); Wound debridement (N/A, 03/28/2016); Vacuum assisted closure change (N/A, 03/30/2016); Colostomy (N/A, 03/30/2016); Colostomy revision (N/A, 03/30/2016); laparotomy (N/A, 04/02/2016); Application if wound vac (N/A, 04/02/2016); laparotomy (N/A, 04/04/2016); laparotomy (N/A, 04/18/2016); and Abdominal wall defect repair (N/A, 04/18/2016). Prior to Admission medications   Medication Sig Start Date End Date Taking? Authorizing Provider  ADVAIR DISKUS 250-50 MCG/DOSE AEPB Inhale 1 puff into the lungs 2 (two) times daily. Once in the am & once at pm 01/06/14  Yes Historical Provider, MD  albuterol (PROVENTIL HFA;VENTOLIN HFA) 108 (90 BASE) MCG/ACT inhaler Inhale 2 puffs into the lungs every 6 (six) hours as needed. Wheezing and shortness of breath   Yes Historical Provider, MD  ergocalciferol (VITAMIN D2) 50000 UNITS capsule Take 50,000 Units by mouth once a week.   Yes Historical Provider, MD  Febuxostat (ULORIC) 80 MG TABS Take 80 mg by mouth daily before breakfast.    Yes Historical Provider, MD  hydrochlorothiazide (MICROZIDE) 12.5 MG capsule Take 12.5-25 mg by mouth as directed. Take 1 capsule Tuesday, Thursday, Saturday, & Sunday Take 2 capsules all other days   Yes Historical Provider, MD  levothyroxine (SYNTHROID, LEVOTHROID) 200 MCG tablet Take 200 mcg by mouth daily before breakfast.    Yes Historical Provider, MD  lisinopril (PRINIVIL,ZESTRIL) 20 MG tablet Take 20 mg by mouth daily.   Yes Historical Provider, MD  nystatin cream (MYCOSTATIN) Apply  topically 2 (two) times  daily. For yeast infection   Yes Historical Provider, MD   Allergies  Allergen Reactions  . Penicillins Anaphylaxis, Hives, Shortness Of Breath, Swelling and Other (See Comments)    Has patient had a PCN reaction causing immediate rash, facial/tongue/throat swelling, SOB or lightheadedness with hypotension: No Has patient had a PCN reaction causing severe rash involving mucus membranes or skin necrosis: No Has patient had a PCN reaction that required hospitalization No Has patient had a PCN reaction occurring within the last 10 years: No If all of the above answers are "NO", then may proceed with Cephalosporin use.    FAMILY HISTORY:  family history includes COPD in her mother; Congestive Heart Failure in her mother; Diabetes in her father and mother; Heart attack in her father. SOCIAL HISTORY:  reports that she has been smoking Cigarettes.  She has a 28 pack-year smoking history. She has never used smokeless tobacco. She reports that she drinks about 0.5 - 1.5 oz of alcohol per week. She reports that she does not use illicit drugs.  REVIEW OF SYSTEMS:   As per HPI - All other systems reviewed and were neg.   SUBJECTIVE:   VITAL SIGNS: Temp:  [98.1 F (36.7 C)-98.2 F (36.8 C)] 98.1 F (36.7 C) (05/17 1443) Pulse Rate:  [54-71] 71 (05/17 1443) Resp:  [18] 18 (05/17 1443) BP: (120-129)/(52-57) 129/52 mmHg (05/17 1443) SpO2:  [97 %-100 %] 98 % (05/17 1443) FiO2 (%):  [28 %] 28 % (05/17 0835) Weight:  [127.914 kg (282 lb)] 127.914 kg (282 lb) (05/17 0546)  PHYSICAL EXAMINATION: General:  Obese female, NAD, pleasant Neuro:  Awake, alert, appropriate, MAE  HEENT:  Mm moist, no JVD, trach with passe muir  Cardiovascular:  s1s2 rrr Lungs:  resps even non labored on ATC  Abdomen:  Obese, soft, dressing clean, ostomy with stool  Musculoskeletal:  1+ BLE edema    Recent Labs Lab 05/03/16 0445  NA 136  K 4.1  CL 99*  CO2 29  BUN 10  CREATININE 0.79  GLUCOSE 88    Recent  Labs Lab 05/03/16 0445 05/07/16 0713  HGB 7.5* 7.6*  HCT 24.6* 25.9*  WBC 7.0 6.2  PLT 264 232   No results found.  ASSESSMENT / PLAN:  Chronic trach dependent respiratory failure -- post prolonged hospital course r/t complicated abd surgery, peritonitis with underlying COPD, OSA.  #6 cuffed trach placed on 4/13.  Now tol passe muir, PO diet.    PLAN -  Will cap trach now - discussed with RT  qhs CPAP  Suspect she can be decannulated  Continue aggressive rehab efforts   Nickolas Madrid, NP 05/09/2016  3:17 PM Pager: [phone] or [phone]

## 2016-05-09 NOTE — Progress Notes (Signed)
Occupational Therapy Session Note  Patient Details  Name: Christina Castaneda MRN: [id number] Date of Birth: [birth year]  Today's Date: 05/09/2016 OT Individual Time: [id number] and 1300-1345 OT Individual Time Calculation (min): 60 min and 45 min   Short Term Goals: Week 1:  OT Short Term Goal 1 (Week 1): Pt will be able to sit to stand from toilet with mod A.  OT Short Term Goal 2 (Week 1): Pt will perform toileting with mod A for clothing management. OT Short Term Goal 3 (Week 1): Pt will don pants with mod A. OT Short Term Goal 4 (Week 1): Pt will tolerate standing at the sink for 1 min.  Skilled Therapeutic Interventions/Progress Updates:    Session One: Pt seen for OT ADL bathing/dressing session with emphasis on functional transfers. Pt in supine upon arrival, voicing need to complete toileting task. She required +2 assist to stand from EOB into STEDY. STEDY used to transfer pt to [facility]. Toal A required for toileting task with +2 to stand from [facility].  She completed bathing/dressing and grooming tasks seated in w/c at sink. She completed all with set-up assist from w/c level. Pt provided with shampoo shower cap and assisted with getting knots out of hair.  Pt remained at sink to complete grooming tasks at end of session with husband present and awaiting next PT session.    Educated throughout session regarding importance of OOB and completing functional transfers in order to increase independence and confidence with transfers. Encouraged pt to stop using bed pan for toileting needs and to practice getting to [facility] with nursing staff throughout the day.   Session Two: Pt seen for OT session focusing on sit <> stand and transfers. Pt in supine upon arrival with call bell going off and pt voicing need for bed pan "right now!" She refused transfer to [facility] stating she did not have enough time. Bed pan placed total A. She was able to complete hygiene with set-up from bed level. She transferred to EOB with  supervision and completed min A stand pivot with RW to w/c with mod A for sit > stand.  Taken to therapy gym total A for time and energy conservation. Completed stand pivot to therapy mat in same manner as described above. Upon getting to mat, pt voiced feeling "swimmy headed" and complaints of nausea. BP 129/69. Pt provided with emesis bag and cold wash cloth. Following rest pt reported symptoms were not getting better. She was able to tolerate min A stand pivot back to w/c. Pt taken back to room total A. Left sitting in w/c with all needs in reach. RN made aware of pt's complaints/ status.   Therapy Documentation Precautions:  Precautions Precautions: Fall, Other (comment) Precaution Comments: 2L on trach collar; PMSV all waking hours Required Braces or Orthoses: Other Brace/Splint Other Brace/Splint: abdominal binder loosely over abdominal wound and ostomy site Restrictions Weight Bearing Restrictions: No Pain:   No/ denies pain ADL: ADL ADL Comments: refer to functional navigator  See Function Navigator for Current Functional Status.   Therapy/Group: Individual Therapy  Lewis, Arlynn Stare C 05/09/2016, 7:10 AM

## 2016-05-09 NOTE — Progress Notes (Signed)
Per CCM verbal order, pt's trach was capped.  Pt is tolerating well with sats of 94 on room air.

## 2016-05-09 NOTE — Progress Notes (Signed)
Physical Therapy Session Note  Patient Details  Name: Christina Castaneda MRN: [id number] Date of Birth: Sep 21, 1956  Today's Date: 05/09/2016 PT Individual Time: 1600-1700 PT Individual Time Calculation (min): 60 min   Short Term Goals: Week 1:  PT Short Term Goal 1 (Week 1): Pt will be able to perform sit <> stands with mod assist using RW PT Short Term Goal 2 (Week 1): Pt will be able to initiate gait with +2 assist PT Short Term Goal 3 (Week 1): Pt will be able to tolerate OOB x 1 hour between therapy sessions to increase overall endurance/activity tolerance  Skilled Therapeutic Interventions/Progress Updates:    Patient received supine in bed and agreeable to PT, as long as she doenst have to leave her room. Patient performed Roll L for placement and removal of bed pan bed with supervision A and heavy use of bed rails. Patient then performed supine sit with supervision A from PT and heavy use of bed rails.  Patient performed sit<>stand x 4 at edge of bed with mod A from PT and cues for improved anterior weight shift and not to pull on standard Walker with transfer. Stand pivot transfer to [facility] with mod A From PT and standard Walker. Patient rested for 4 minutes with mild complaints of orthostasis.  After orthostatic symptoms dissipated, Gait traing in room performed for 60ft with min A from PT. Patient noted to have mild increase in anxiety and had to sit in arm chair.for 5 minutes to rest with complaints that she was going to pass out. PT assessed vital: HR 89, BP 125/79, SpO2 99%. Patient performed stand pivot transfer back to [facility] following rest break and reqiured mod-max A to stand from lower sitting surface. Patient then performed squat pivot transfer into bed with min A and heavy use of bed rails. Sit<>supine transfer with min A to help the LLE.  Once supine Patient performed BLE therex including SLR x 8, Heel slides x 10, mini bridge x 6, and Hip abduction x 10 PT provided min cues for improved  ROM and increased control with eccentric movements for improved strengthening of activities.   Patient left supine in bed with call bell within reach.    Therapy Documentation Precautions:  Precautions Precautions: Fall, Other (comment) Precaution Comments: 2L on trach collar; PMSV all waking hours Required Braces or Orthoses: Other Brace/Splint Other Brace/Splint: abdominal binder loosely over abdominal wound and ostomy site Restrictions Weight Bearing Restrictions: No  Pain: Pain Assessment Pain Assessment: 0-10 Pain Score: 4  Pain Type: Acute pain Pain Location: Abdomen Pain Orientation: Left;Mid;Lower Pain Descriptors / Indicators: Aching Pain Frequency: Intermittent Pain Onset: Gradual Patients Stated Pain Goal: 2 Pain Intervention(s): Medication (See eMAR)  See Function Navigator for Current Functional Status.   Therapy/Group: Individual Therapy  Lorie Phenix 05/09/2016, 6:46 PM

## 2016-05-09 NOTE — Progress Notes (Signed)
Physical Therapy Session Note  Patient Details  Name: Christina Castaneda MRN: [id number] Date of Birth: [birth year]  Today's Date: 05/09/2016 PT Individual Time: [id number] PT Individual Time Calculation (min): 25 min   Short Term Goals: Week 1:  PT Short Term Goal 1 (Week 1): Pt will be able to perform sit <> stands with mod assist using RW PT Short Term Goal 2 (Week 1): Pt will be able to initiate gait with +2 assist PT Short Term Goal 3 (Week 1): Pt will be able to tolerate OOB x 1 hour between therapy sessions to increase overall endurance/activity tolerance  Skilled Therapeutic Interventions/Progress Updates:   pt up in w/c finishing doing her hair at the sink. Focused on sit <> stands, short distance gait with pt's personal SW, transfer back to bed, and overall endurance. Pt performed sit to stands with mod assist progressing to min assist on last trial with cues for hand placement. Min assist for gait with SW x 5' x 10' x 8' with cues for upright posture and placement of walker. Pt reporting she was "done and tired" and requested to return back to bed. Gait back to edge of bed and min assist to return to supine.scooted with supervision and all needs in reach. Only one report of mild dizziness during mobility but passed quickly and did not impact session.   Therapy Documentation Precautions:  Precautions Precautions: Fall, Other (comment) Precaution Comments: 2L on trach collar; PMSV all waking hours Required Braces or Orthoses: Other Brace/Splint Other Brace/Splint: abdominal binder loosely over abdominal wound and ostomy site Restrictions Weight Bearing Restrictions: No General: PT Amount of Missed Time (min): 5 Minutes PT Missed Treatment Reason: Patient fatigue  Pain: Premedicated. No complaints of pain just fatigue.   See Function Navigator for Current Functional Status.   Therapy/Group: Individual Therapy  Canary Brim [name], PT, DPT  05/09/2016, 11:35  AM

## 2016-05-10 ENCOUNTER — Inpatient Hospital Stay (HOSPITAL_COMMUNITY): Payer: BLUE CROSS/BLUE SHIELD | Admitting: Occupational Therapy

## 2016-05-10 ENCOUNTER — Inpatient Hospital Stay (HOSPITAL_COMMUNITY): Payer: BLUE CROSS/BLUE SHIELD

## 2016-05-10 ENCOUNTER — Inpatient Hospital Stay (HOSPITAL_COMMUNITY): Payer: BLUE CROSS/BLUE SHIELD | Admitting: Physical Therapy

## 2016-05-10 NOTE — Progress Notes (Signed)
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 Hulen Skains  Left colectomy and end colostomy (03/30/16)--Dr. Donne Hazel Re-exploration of open abdomen and application of wound vac (04/02/16)--Dr. Redmond Pulling Placement of ABRA abdominal wall closure device (04/05/16)--Dr. Redmond Pulling Removal of ABRA device and closure of abdominal wall (04/18/16)--Dr. Cornett -Continue present management -Drainage likely ascites or third spacing now that she's mobilizing more. Hopefully this drainage will begin to slow over the next few days.  -Change the dressings TID and more frequently as needed to keep the patients skin dry, can resume foam pads when drainage output is not so high -Rentention sutures will need to stay in until at least the end of June, this will be left up to Dr. Hulen Skains. Please call the office to schedule a follow up appointment with Dr. Hulen Skains for 2-3 weeks after discharge. -Will sign off, call with questions/concerns

## 2016-05-10 NOTE — Progress Notes (Signed)
Physical Therapy Session Note  Patient Details  Name: Christina Castaneda MRN: [id number] Date of Birth: [birth year]  Today's Date: 05/10/2016 PT Individual Time: 0918-1000 PT Individual Time Calculation (min): 42 min   Short Term Goals: Week 1:  PT Short Term Goal 1 (Week 1): Pt will be able to perform sit <> stands with mod assist using RW PT Short Term Goal 2 (Week 1): Pt will be able to initiate gait with +2 assist PT Short Term Goal 3 (Week 1): Pt will be able to tolerate OOB x 1 hour between therapy sessions to increase overall endurance/activity tolerance  Skilled Therapeutic Interventions/Progress Updates:   Upon arrival, pt very lethargic and difficult to arouse. Pt able to state she was just so sleepy and couldn't do anything right now besides sleep. Notified RN to find out of a new medication was given or something that could cause this. Pt still unable upon return, so PT left.  A few min later RN found PT to tell her the patient wanted to try to work with therapy. Pt agreeable to attempt to get up to see if this would increase her arousal. Extra time and use of bed rails, pt able to come to EOB with supervision and a few minutes to let wooziness pass. Performed BSC transfers and standing balance for patient to perform hygiene - completed sit <> stands with min to mod assist and actual transfer with close supervision/steady assist. Pt performed hygiene at sink and brushed her teeth mod I from w/c. Seated in w/c performed LE strengthening on Kinetron x 8 min total with 1 rest break between 4 min intervals. End of session set up in w/c at bedside with all needs in reach.   Therapy Documentation Precautions:  Precautions Precautions: Fall, Other (comment) Precaution Comments: Trach capped as of 5/18 on room air Required Braces or Orthoses: Other Brace/Splint Other Brace/Splint: abdominal binder loosely over abdominal wound and ostomy site Restrictions Weight Bearing Restrictions:  No General: PT Amount of Missed Time (min): 18 Minutes PT Missed Treatment Reason: Patient fatigue;Other (Comment) (lethargy) Vital Signs: O2 = 100% on room air with trach capped. Pain: Premedicated for abdominal pain.  See Function Navigator for Current Functional Status.   Therapy/Group: Individual Therapy  Canary Brim [name], PT, DPT  05/10/2016, 12:13 PM

## 2016-05-10 NOTE — Progress Notes (Signed)
Social Work Patient ID: Christina Castaneda, female   DOB: 29-Jun-1956, 60 y.o.   MRN: [id number]   Have reviewed team conference with pt this week.  She is aware and agreeable with targeted d/c date for 6/2 and minimal assistance goals.  We discussed her anxiety and the fact that it is intefering with tx progression.  She states she is aware of this, however, feels it is out of her control.  She also described having "panic attacks" that have even woken her from sleep.  Pt agreeable with referral for neuropsych consult to discuss this further - to be seen tomorrow.  Will follow up with her next week.  Aaden Buckman, LCSW

## 2016-05-10 NOTE — Consult Note (Signed)
Name: Christina Castaneda MRN: [id number] DOB: 23-Jul-1956    ADMISSION DATE:  05/02/2016 CONSULTATION DATE:  5/17  REFERRING MD :  Naaman Plummer   CHIEF COMPLAINT:  respiratory failure   BRIEF PATIENT DESCRIPTION: 60yo female with hx COPD, OSA, morbid obesity who was initially admitted to [facility] 4/5 with complicated abdomen after a large abd surgery at an outside facility.  She underwent exp lap and multiple follow up surgeries for closure.  She had a prolonged, complicated hospital course including acute respiratory failure requiring tracheostomy placement.  She has been recovering slowly and has since tx to inpatient rehab where she is making some progress with therapy, tolerating passe muir valve.  PCCM now consulted regarding trach plan going forward.   SIGNIFICANT EVENTS  S/p laparotomy, colostomy removal and end stapling of stoma, partial colectomy, omentectomy, debridement of wound (03/29/16)--Dr. Hulen Skains  Left colectomy and end colostomy (03/30/16)--Dr. Donne Hazel Re-exploration of open abdomen and application of wound vac (04/02/16)--Dr. Redmond Pulling Placement of ABRA abdominal wall closure device (04/05/16)--Dr. Redmond Pulling Removal of ABRA device and closure of abdominal wall (04/18/16)--Dr. Cornett  #6 cuffed trach (JY) 4/13>>>  STUDIES:     HISTORY OF PRESENT ILLNESS:  60yo female with hx COPD, OSA, morbid obesity who was initially admitted to [facility] 4/5 with complicated abdomen after a large abd surgery at an outside facility.  She underwent exp lap and multiple follow up surgeries for closure.  She had a prolonged, complicated hospital course including acute respiratory failure requiring tracheostomy placement.  She has been recovering slowly and has since tx to inpatient rehab where she is making some progress with therapy, tolerating passe muir valve.  PCCM now consulted regarding trach plan going forward.   PAST MEDICAL HISTORY :   has a past medical history of Goiter; Hypothyroidism; Asthma;  Hypertension; Atypical endometrial hyperplasia; Heart murmur; Vitamin B12 deficiency; Yeast infection; Gout; and COPD (chronic obstructive pulmonary disease) ([place]).  has past surgical history that includes Neck surgery (1994); Tonsillectomy; Cholecystectomy (2009); Robotic assisted total hysterectomy with bilateral salpingo oophorectomy (11/18/2012); Colostomy revision (N/A, 03/28/2016); laparotomy (N/A, 03/28/2016); Omentectomy (N/A, 03/28/2016); Wound debridement (N/A, 03/28/2016); Vacuum assisted closure change (N/A, 03/30/2016); Colostomy (N/A, 03/30/2016); Colostomy revision (N/A, 03/30/2016); laparotomy (N/A, 04/02/2016); Application if wound vac (N/A, 04/02/2016); laparotomy (N/A, 04/04/2016); laparotomy (N/A, 04/18/2016); and Abdominal wall defect repair (N/A, 04/18/2016). Prior to Admission medications   Medication Sig Start Date End Date Taking? Authorizing Provider  ADVAIR DISKUS 250-50 MCG/DOSE AEPB Inhale 1 puff into the lungs 2 (two) times daily. Once in the am & once at pm 01/06/14  Yes Historical Provider, MD  albuterol (PROVENTIL HFA;VENTOLIN HFA) 108 (90 BASE) MCG/ACT inhaler Inhale 2 puffs into the lungs every 6 (six) hours as needed. Wheezing and shortness of breath   Yes Historical Provider, MD  ergocalciferol (VITAMIN D2) 50000 UNITS capsule Take 50,000 Units by mouth once a week.   Yes Historical Provider, MD  Febuxostat (ULORIC) 80 MG TABS Take 80 mg by mouth daily before breakfast.    Yes Historical Provider, MD  hydrochlorothiazide (MICROZIDE) 12.5 MG capsule Take 12.5-25 mg by mouth as directed. Take 1 capsule Tuesday, Thursday, Saturday, & Sunday Take 2 capsules all other days   Yes Historical Provider, MD  levothyroxine (SYNTHROID, LEVOTHROID) 200 MCG tablet Take 200 mcg by mouth daily before breakfast.    Yes Historical Provider, MD  lisinopril (PRINIVIL,ZESTRIL) 20 MG tablet Take 20 mg by mouth daily.   Yes Historical Provider, MD  nystatin cream (MYCOSTATIN) Apply  topically 2 (two) times  daily. For yeast infection   Yes Historical Provider, MD   Allergies  Allergen Reactions  . Penicillins Anaphylaxis, Hives, Shortness Of Breath, Swelling and Other (See Comments)    Has patient had a PCN reaction causing immediate rash, facial/tongue/throat swelling, SOB or lightheadedness with hypotension: No Has patient had a PCN reaction causing severe rash involving mucus membranes or skin necrosis: No Has patient had a PCN reaction that required hospitalization No Has patient had a PCN reaction occurring within the last 10 years: No If all of the above answers are "NO", then may proceed with Cephalosporin use.    FAMILY HISTORY:  family history includes COPD in her mother; Congestive Heart Failure in her mother; Diabetes in her father and mother; Heart attack in her father. SOCIAL HISTORY:  reports that she has been smoking Cigarettes.  She has a 28 pack-year smoking history. She has never used smokeless tobacco. She reports that she drinks about 0.5 - 1.5 oz of alcohol per week. She reports that she does not use illicit drugs.  REVIEW OF SYSTEMS:   As per HPI - All other systems reviewed and were neg.   SUBJECTIVE:  Pt tolerating capped trach denies shortness of breath at rest. She is tolerating diet and ambulation with PT.    VITAL SIGNS: Temp:  [97.6 F (36.4 C)-98.1 F (36.7 C)] 97.6 F (36.4 C) (05/18 0553) Pulse Rate:  [62-71] 62 (05/18 0553) Resp:  [18-20] 20 (05/18 0553) BP: (129-136)/(44-52) 136/44 mmHg (05/18 0553) SpO2:  [97 %-99 %] 99 % (05/18 0834) Weight:  [282 lb (127.914 kg)] 282 lb (127.914 kg) (05/18 0553)  PHYSICAL EXAMINATION: General:  Obese female, NAD, pleasant Neuro:  Awake, alert, appropriate, MAE  HEENT:  Mm moist, no JVD, capped #6 shiley trach Cardiovascular:  s1s2 rrr Lungs:  resps even non labored on ATC, clear throughout Abdomen:  Obese, soft, dressing clean, ostomy with stool  Musculoskeletal:  1+ BLE edema   No results for input(s): NA,  K, CL, CO2, BUN, CREATININE, GLUCOSE in the last 168 hours.  Recent Labs Lab 05/07/16 0713  HGB 7.6*  HCT 25.9*  WBC 6.2  PLT 232   No results found.  ASSESSMENT / PLAN:  Chronic trach dependent respiratory failure -- post prolonged hospital course r/t complicated abd surgery, peritonitis with underlying COPD, OSA.  #6 cuffed trach placed on 4/13.  Now tol passe muir, PO diet.    PLAN -  Continue with capped trach qhs CPAP  Suspect she can be decannulated  Continue aggressive rehab efforts   Marda Stalker, [place]    Attending note: I have seen and examined the patient with nurse practitioner/resident and agree with the note. History, labs and imaging reviewed.  60 Y/O with prolonged stay , open abd, multiple abd surgeries.  Now doing well. Trach capped yesterday and she is comfortable. She was not put on CPAP last night.  Please ensure CPAP is used at nights. If stable on CPAP over the weekend then we will decannulate on Mon.   Marshell Garfinkel MD [facility] Pager [phone] If no answer or after 3pm call: [phone] 05/10/2016, 4:07 PM

## 2016-05-10 NOTE — Consult Note (Signed)
WOC wound follow up Wound type: surgical wound with retention sutures Husband asked me to look at the retention sutures sites, she did have some skin necrosis noted under many of the sites however these are improving.  She has one site just medial to her stoma that is aprox. 1.5cm x 2.0cm x 0.2 cm with 100% yellow slough but it seems to be autolysing itself.  I protected the site today with a small pc of silicone foam in order to pouch the stoma in a fashion that will be more like her needs at home (downward).  I replaced the gauze under the retention rods and contacted [facility] about one of the rods that is very loose and the suture is barely in the skin, however at this time they would like to keep that one in place.  Dressing procedure/placement/frequency: continue saline packing, changing TID and split gauze under the retention sutures.  In order to keep patient's skin dry.  WOC ostomy follow up Stoma type/location: LLQ, end colostomy Stomal assessment/size: 1 3/4" x 1 3/8" oval shaped, budded, pink, moist Peristomal assessment: intact, Treatment options for stomal/peristomal skin: using 2" barrier ring to aid in seal Output: formed, green stool Ostomy pouching: 1pc. With 2" barrier ring.  Education provided: demonstrated pouch change, with patient and husband watching.  Patient did better with odor today.   Enrolled patient in [place] Start Discharge program: Yes  WOC will follow along with you for continued support with ostomy teaching and care [facility] RN,CWOCN [id number]

## 2016-05-10 NOTE — Progress Notes (Signed)
[facility] PHYSICAL MEDICINE & REHABILITATION     PROGRESS NOTE    Subjective/Complaints: Had a reasonable night. Admits to bouts of anxiety with therapy and at night time too. States she's trying her best though  ROS: Pt denies fever, rash/itching, headache, blurred or double vision, nausea, vomiting,   diarrhea, chest pain, shortness of breath, palpitations, dysuria, dizziness, neck or back pain, bleeding, or depression    Objective: Vital Signs: Blood pressure 136/44, pulse 62, temperature 97.6 F (36.4 C), temperature source Oral, resp. rate 20, height 5\' 7"  (1.702 m), weight 127.914 kg (282 lb), last menstrual period 11/18/2012, SpO2 99 %. No results found. No results for input(s): WBC, HGB, HCT, PLT in the last 72 hours. No results for input(s): NA, K, CL, GLUCOSE, BUN, CREATININE, CALCIUM in the last 72 hours.  Invalid input(s): CO CBG (last 3)  No results for input(s): GLUCAP in the last 72 hours.  Wt Readings from Last 3 Encounters:  05/10/16 127.914 kg (282 lb)  05/02/16 141 kg (310 lb 13.6 oz)  01/07/14 138.347 kg (305 lb)    Physical Exam:  Constitutional: She is oriented to person, place, and time. She appears well-developed and well-nourished.  Morbidly obese female  HENT: ear wax Head: Normocephalic and atraumatic.  Eyes: Conjunctivae and EOM are normal. Pupils are equal, round, and reactive to light.  Neck:  #6 trach with PMV. good phonation   Cardiovascular: Normal rate and regular rhythm.  No murmur heard. Respiratory: Effort normal. No stridor. She has decreased breath sounds in the right lower field and the left lower field. She has wheezes. She has rhonchi.  GI: Soft. Bowel sounds are normal. She exhibits no distension. There is tenderness.  Ostomy with  formed stool. area dry surrounding Musculoskeletal: She exhibits edema. She exhibits no tenderness.  Neurological: She is alert and oriented to person, place, and time.  Motor:  Bilateral upper  extremities 4+/5 proximal to distal Bilateral lower extremities hip flexion, knee extension 4-/5, ankle dorsi/plantarflexion 4+/5 Sensation diminished to light touch bilateral feet  Skin: Skin is warm and dry.  Midline abdominal incision with retention sutures/granulation tissue. ABD pad and gauze with continued serous drainage. Dressing saturated    Psychiatric: Her speech is normal. Her affect is appropriate at the moment     Assessment/Plan: 1. Debility secondary to weakness from multiple medical issues/respiratory failure which require 3+ hours per day of interdisciplinary therapy in a comprehensive inpatient rehab setting. Physiatrist is providing close team supervision and 24 hour management of active medical problems listed below. Physiatrist and rehab team continue to assess barriers to discharge/monitor patient progress toward functional and medical goals.  Function:  Bathing Bathing position   Position: Wheelchair/chair at sink  Bathing parts Body parts bathed by patient: Right arm, Left arm, Chest, Abdomen, Right upper leg, Left upper leg, Right lower leg, Left lower leg, Front perineal area Body parts bathed by helper: Buttocks, Back  Bathing assist Assist Level: Touching or steadying assistance(Pt > 75%) (Standing in STEDY)      Upper Body Dressing/Undressing Upper body dressing   What is the patient wearing?: Button up shirt     Pull over shirt/dress - Perfomed by patient: Thread/unthread right sleeve, Thread/unthread left sleeve, Put head through opening, Pull shirt over trunk Pull over shirt/dress - Perfomed by helper: Pull shirt over trunk Button up shirt - Perfomed by patient: Thread/unthread right sleeve, Thread/unthread left sleeve, Pull shirt around back, Button/unbutton shirt Button up shirt - Perfomed by helper: Button/unbutton  shirt    Upper body assist Assist Level: Set up   Set up : To obtain clothing/put away  Lower Body Dressing/Undressing Lower body  dressing   What is the patient wearing?: Non-skid slipper socks     Pants- Performed by patient: Thread/unthread right pants leg, Thread/unthread left pants leg Pants- Performed by helper: Pull pants up/down Non-skid slipper socks- Performed by patient: Don/doff right sock, Don/doff left sock Non-skid slipper socks- Performed by helper: Don/doff left sock, Don/doff right sock                  Lower body assist Assist for lower body dressing: Touching or steadying assistance (Pt > 75%) (in standing wtih Stedy to pull up pants)      Toileting Toileting   Toileting steps completed by patient: Adjust clothing prior to toileting Toileting steps completed by helper: Performs perineal hygiene, Adjust clothing after toileting    Toileting assist     Transfers Chair/bed transfer   Chair/bed transfer method: Stand pivot, Ambulatory Chair/bed transfer assist level: Moderate assist (Pt 50 - 74%/lift or lower) Chair/bed transfer assistive device: Walker, Armrests Mechanical lift: Ecologist Ambulation activity did not occur: Safety/medical concerns   Max distance: 10' Assist level: Touching or steadying assistance (Pt > 75%)   Wheelchair   Type: Manual Max wheelchair distance: 55ft  Assist Level: Supervision or verbal cues  Cognition Comprehension Comprehension assist level: Follows complex conversation/direction with no assist  Expression Expression assist level: Expresses complex ideas: With no assist  Social Interaction Social Interaction assist level: Interacts appropriately 90% of the time - Needs monitoring or encouragement for participation or interaction.  Problem Solving Problem solving assist level: Solves complex problems: Recognizes & self-corrects  Memory Memory assist level: Recognizes or recalls 90% of the time/requires cueing < 10% of the time   Medical Problem List and Plan: 1. Weakness, decreased endurance secondary to debility from multiple  medical issues  -continue CIR-  -discussed the fact that she will have to work through [place] if she wants to achieve goals 2. DVT Prophylaxis/Anticoagulation: Pharmaceutical: Lovenox  -remains indicated  -dopplers negative 3. Pain Management: continue Fentanyl patch 100 mcg with oxycodone prn for pain  -states that pain is predominantly abdominal   -having some pain in legs with numbness, likely peripheral neuropathy with stocking glove sensory loss   -reviewed with patient potential causes and treatment yet again today---she doesn't recall [place] discussing before. Was satisfied with my answers. STM deficits 4. Mood: LCSW to follow for evaluation and support.   -will add scheduled xanax to assist with anxiety  5. Neuropsych: This patient is capable of making decisions on her own behalf. 6. Skin/Wound Care: Dressing changes--increased to tid  .   -wound dressings per surgery. Continued drainage particularly with increased activity  -ostomy competent  -abd binder to help hold things in place with therapy 7. Fluids/Electrolytes/Nutrition: Monitor I/O.   Renal status stable. Add supplements to help manage low protein stores. 8. ABLA: H/H stable at 7.6-8.0 range.---7.6 most recently - iron deficiency anemia  -B12 normal.   -Fe++ supp    9. COPD/OSA in setting of morbid obesity:  Continue brovana bid and Pulmicort bid.   - trach capped---did very well last night. decannulate soon. Pulmonary to follow up 10. Hypothyroid: On supplement.  11. Morbid Obesity Body mass index is 50.2 kg/(m^2). Diet and exercise education Will cont to encourage weight loss to increase endurance and promote overall health   -dc cbg  checks/SSI 12. Tobacco abuse: Continue counseling 13. Bilateral knee pain Will continue to monitor and adjust medications as necessary 14. Hypothyroidism Continue medications  -TSH elevated by free  T4 normal/high--no changes 15. Ear wax---debrox has helped    LOS (Days) 8 A FACE TO FACE EVALUATION WAS PERFORMED  SWARTZ,ZACHARY T 05/10/2016 8:19 AM

## 2016-05-10 NOTE — Progress Notes (Signed)
Physical Therapy Weekly Progress Note  Patient Details  Name: Christina Castaneda MRN: [id number] Date of Birth: 24-Jun-1956  Beginning of progress report period: May 03, 2016 End of progress report period: May 10, 2016  Patient has met 3 of 3 short term goals. Pt is making slow progress overall and very limited participation due to low activity tolerance, anxiety, and overall debility. Pt has missed a significant amount of time over the last week due to refusal to participate, fatigue, and dizziness. Pt has declined a modification in schedule (15/7). Pt has progressed to performing transfers and initiating short distance gait with her personal standard walker. Focus is to continue building overall endurance and strength to allow for safe discharge home. Husband is supportive and often present during sessions. Pt's trach is now capped and tolerating room air during activity.   Patient continues to demonstrate the following deficits: decreased strength, decreased endurance, decreased balance, decreased functional mobility, pain, orthostasis, and body habitus and therefore will continue to benefit from skilled PT intervention to enhance overall performance with activity tolerance, balance and ability to compensate for deficits.  Patient progressing toward long term goals..  Continue plan of care.  PT Short Term Goals Week 1:  PT Short Term Goal 1 (Week 1): Pt will be able to perform sit <> stands with mod assist using RW PT Short Term Goal 1 - Progress (Week 1): Met PT Short Term Goal 2 (Week 1): Pt will be able to initiate gait with +2 assist PT Short Term Goal 2 - Progress (Week 1): Met PT Short Term Goal 3 (Week 1): Pt will be able to tolerate OOB x 1 hour between therapy sessions to increase overall endurance/activity tolerance PT Short Term Goal 3 - Progress (Week 1): Met Week 2:  PT Short Term Goal 1 (Week 2): Pt will be able to progress sit <> stands to consistent min assist with walker PT  Short Term Goal 2 (Week 2): Pt will be able to gait x 20' with min assist PT Short Term Goal 3 (Week 2): Pt will be able to initiate stair negotiation training  Skilled Therapeutic Interventions/Progress Updates:  Ambulation/gait training;Balance/vestibular training;Community reintegration;Discharge planning;Disease management/prevention;DME/adaptive equipment instruction;Functional mobility training;Neuromuscular re-education;Pain management;Patient/family education;Psychosocial support;Skin care/wound management;Splinting/orthotics;Stair training;Therapeutic Activities;Therapeutic Exercise;UE/LE Strength taining/ROM;UE/LE Coordination activities;Wheelchair propulsion/positioning   Therapy Documentation Precautions:  Precautions Precautions: Fall, Other (comment) Precaution Comments: Trach capped as of 5/18 on room air Required Braces or Orthoses: Other Brace/Splint Other Brace/Splint: abdominal binder loosely over abdominal wound and ostomy site Restrictions Weight Bearing Restrictions: No   See Function Navigator for Current Functional Status.   Canary Brim [name], PT, DPT  05/10/2016, 4:09 PM

## 2016-05-10 NOTE — Progress Notes (Signed)
Occupational Therapy Weekly Progress Note  Patient Details  Name: Christina Castaneda MRN: [id number] Date of Birth: 02-Jan-1956  Beginning of progress report period: May 03, 2016 End of progress report period: May 10, 2016  Today's Date: 05/10/2016 OT Individual Time: 0092-3300 OT Individual Time Calculation (min): 68 min    Patient has met 0 of 4 short term goals.  Pt making slow progress towards OT goals. She is very limited by weakness/ deconditioning and anxiety. She requires encouragement for full participation during tx sessions and easily becomes overwhelmed by tasks given by therapist. Cont to adjust therapy schedule to allow for ample rest breaks throughout day in order to improve quality of therapy sessions and allow for rest btwn tx sessions. Pt remains very motivated to return home following CIR.   Patient continues to demonstrate the following deficits: acute pain and muscle weakness (generalized) and therefore will continue to benefit from skilled OT intervention to enhance overall performance with BADL, iADL and Reduce care partner burden.  Patient progressing toward long term goals..  Continue plan of care.  OT Short Term Goals Week 1:  OT Short Term Goal 1 (Week 1): Pt will be able to sit to stand from toilet with mod A.  OT Short Term Goal 1 - Progress (Week 1): Partly met OT Short Term Goal 2 (Week 1): Pt will perform toileting with mod A for clothing management. OT Short Term Goal 2 - Progress (Week 1): Not met OT Short Term Goal 3 (Week 1): Pt will don pants with mod A. OT Short Term Goal 3 - Progress (Week 1): Discontinued (comment) (Pt does not wear pants) OT Short Term Goal 4 (Week 1): Pt will tolerate standing at the sink for 1 min. OT Short Term Goal 4 - Progress (Week 1): Not met Week 2:  OT Short Term Goal 1 (Week 2): Pt will complete functional transfers with supervision consistently OT Short Term Goal 2 (Week 2): Pt will tolerate full OT bathing/ dressing with  only 1 rest break in order to increase functional activity tolerance OT Short Term Goal 3 (Week 2): Pt will complete toileting task with supervision OT Short Term Goal 4 (Week 2): Pt will complete one grooming task in standing in order to increase activity tolerance.   Skilled Therapeutic Interventions/Progress Updates:    Pt seen for OT ADL bathing/dressing session with emphasis on sit <> stand, functional transfers, and standing balance/ tolerance. Pt in supine upon arrival, voicing increased fatigue, however, with encouragement from husband and therapist pt willing to attempt session. She transferred into w/c, ambulating ~49f to w/c with min A. VCs required throughout session to reach back when sitting and for deep breathing techniques. Pt at >92% on RA during session. She completed bathing task seated in w/c at sink, standing with mod A to pull dress down. Pt completed 2x sit <> stand at sink, tolerating 40 seconds and 50 seconds respectively with VCs for upright posture as pt requires heavy B UE support in standing.  Pt taken to therapy gym total A. She completed stand pivot to therapy mat. Explained to pt wanting to work on standing balance without requiring B UE support. Pt became very anxious with idea of letting go of RW. Attempted to calm pt down and removed table top task she was initially set-up with. She was willing to attempt sit <> stand tolerating 20 seconds and 14 seconds respectively before requesting to sit down. VCs required to reach back to sit.  She  returned to room and requested to return to bed. Encouraged pt to sit up in recliner in order to build upright tolerance. She was  Agreeable to attempt. When ambulating short distance to recliner, pt with LOB episode requiring min A and use of wall to catch balance. Pt voiced it being due to weakness in knee was reason for fall. Once again suspect anxiety playing role. Will follow up with CSW regarding neuropsych eval.  Throughout session,  pt required extensive rest breaks throughout. Suspect this is a combination of generalized weakness and high anxiety.  Pt left in recliner at end of session, all needs in reach.   Therapy Documentation Precautions:  Precautions Precautions: Fall, Other (comment) Precaution Comments: 2L on trach collar; PMSV all waking hours Required Braces or Orthoses: Other Brace/Splint Other Brace/Splint: abdominal binder loosely over abdominal wound and ostomy site Restrictions Weight Bearing Restrictions: No ADL: ADL ADL Comments: refer to functional navigator  See Function Navigator for Current Functional Status.   Therapy/Group: Individual Therapy  Lewis, Harika Laidlaw C 05/10/2016, 7:19 AM

## 2016-05-10 NOTE — Progress Notes (Signed)
Occupational Therapy Note  Patient Details  Name: Christina Castaneda MRN: [id number] Date of Birth: 1956-06-22  Today's Date: 05/10/2016 OT Individual Time: 1330-1400 OT Individual Time Calculation (min): 30 min   Pt denied pain Individual therapy  Pt resting in recliner upon arrival.  Pt stated she was "exhausted" and was having trouble keeping her eyes open.  Initially attempted to engage patient in sit<>stand from recliner but pt c/o dizziness each time she sat upright in recliner.  Vitals: O2 97, HR 81, and BP 132/53.  Pt stated that the symptoms resolved slowly but reoccurred each time she sat upright.  Pt expressed frustration but stated she was fearful of standing secondary to dizziness.  Pt repositioned in recliner X 3 without assistance.  Pt remained in recliner with all needs within reach.    Leotis Shames [facility] 05/10/2016, 2:19 PM

## 2016-05-10 NOTE — Progress Notes (Signed)
Physical Therapy Session Note  Patient Details  Name: Christina Castaneda MRN: [id number] Date of Birth: December 26, 1955  Today's Date: 05/10/2016 PT Individual Time: 1527-1600 PT Individual Time Calculation (min): 33 min   Short Term Goals: Week 1:  PT Short Term Goal 1 (Week 1): Pt will be able to perform sit <> stands with mod assist using RW PT Short Term Goal 1 - Progress (Week 1): Met PT Short Term Goal 2 (Week 1): Pt will be able to initiate gait with +2 assist PT Short Term Goal 2 - Progress (Week 1): Met PT Short Term Goal 3 (Week 1): Pt will be able to tolerate OOB x 1 hour between therapy sessions to increase overall endurance/activity tolerance PT Short Term Goal 3 - Progress (Week 1): Met Week 2:  PT Short Term Goal 1 (Week 2): Pt will be able to progress sit <> stands to consistent min assist with walker PT Short Term Goal 2 (Week 2): Pt will be able to gait x 20' with min assist PT Short Term Goal 3 (Week 2): Pt will be able to initiate stair negotiation training  Skilled Therapeutic Interventions/Progress Updates:  Patient received supine in bed and agreeable to PT. Patient performed bed mobility training for roll L and R with bed rails x 3 in each direction with min A from PT. PT also provided moderate cues for improve pelvic rotation with increased hip extension followed by flexion as well as increased shoulder protraction to improve facilitation of trunk rotation. Patient report significant increase in dizziness with roll to R and minor increase in dizziness with roll to L. Patient was unable to keep eyes open following roll, therefore PT unable to assess for nystagmus.  Patient performed L sidelying to sit with supervision A and heavy use of bed rails.   Seated BLE therex performed EOB  LAQ with 3# ankle weight x 8 on RLE and x 5 on LLE due to increase pain in knee from OA.  Alternating marches x 10 BLE with 3# ankle weight  BLE hip abduction x 10 with manual resistance from PT.   BLE HS curl x 10 with manual resistance from PT PT provided min cues for increased ROM as well as improved control of eccentric movements to for improved strengthening aspect of movements.   Patient performed sit<>supine in min A from PT at assist with LLE. And min cues proper sequencing of movements to decrease strain on abdominals. Patient left supine in bed with call bell within reach.     Therapy Documentation Precautions:  Precautions Precautions: Fall, Other (comment) Precaution Comments: Trach capped as of 5/18 on room air Required Braces or Orthoses: Other Brace/Splint Other Brace/Splint: abdominal binder loosely over abdominal wound and ostomy site Restrictions Weight Bearing Restrictions: No General:   Vital Signs: Therapy Vitals Temp: 99.2 F (37.3 C) Temp Source: Oral Pulse Rate: 76 Resp: 16 BP: (!) 152/65 mmHg Patient Position (if appropriate): Sitting Oxygen Therapy SpO2: 95 % O2 Device: Not Delivered Pain:   0/10    See Function Navigator for Current Functional Status.   Therapy/Group: Individual Therapy  Lorie Phenix 05/10/2016, 4:11 PM

## 2016-05-11 ENCOUNTER — Inpatient Hospital Stay (HOSPITAL_COMMUNITY): Payer: BLUE CROSS/BLUE SHIELD

## 2016-05-11 ENCOUNTER — Inpatient Hospital Stay (HOSPITAL_COMMUNITY): Payer: BLUE CROSS/BLUE SHIELD | Admitting: Occupational Therapy

## 2016-05-11 ENCOUNTER — Encounter (HOSPITAL_COMMUNITY): Payer: BLUE CROSS/BLUE SHIELD

## 2016-05-11 DIAGNOSIS — F4323 Adjustment disorder with mixed anxiety and depressed mood: Secondary | ICD-10-CM

## 2016-05-11 MED ORDER — SENNOSIDES-DOCUSATE SODIUM 8.6-50 MG PO TABS
2.0000 | ORAL_TABLET | Freq: Two times a day (BID) | ORAL | Status: DC
  Administered 2016-05-11 – 2016-05-23 (×23): 2 via ORAL
  Filled 2016-05-11 (×23): qty 2

## 2016-05-11 NOTE — Consult Note (Signed)
Name: Christina Castaneda MRN: [id number] DOB: 12-21-1956    ADMISSION DATE:  05/02/2016 CONSULTATION DATE:  5/17  REFERRING MD :  Naaman Plummer   CHIEF COMPLAINT:  respiratory failure   BRIEF PATIENT DESCRIPTION: 60yo female with hx COPD, OSA, morbid obesity who was initially admitted to [facility] 4/5 with complicated abdomen after a large abd surgery at an outside facility.  She underwent exp lap and multiple follow up surgeries for closure.  She had a prolonged, complicated hospital course including acute respiratory failure requiring tracheostomy placement.  She has been recovering slowly and has since tx to inpatient rehab where she is making some progress with therapy, tolerating passe muir valve.  PCCM now consulted regarding trach plan going forward.   SIGNIFICANT EVENTS  S/p laparotomy, colostomy removal and end stapling of stoma, partial colectomy, omentectomy, debridement of wound (03/29/16)--Dr. Hulen Skains  Left colectomy and end colostomy (03/30/16)--Dr. Donne Hazel Re-exploration of open abdomen and application of wound vac (04/02/16)--Dr. Redmond Pulling Placement of ABRA abdominal wall closure device (04/05/16)--Dr. Redmond Pulling Removal of ABRA device and closure of abdominal wall (04/18/16)--Dr. Cornett  #6 cuffed trach (JY) 4/13>>> Decannulated 5/19>>  STUDIES:  HISTORY OF PRESENT ILLNESS:  60yo female with hx COPD, OSA, morbid obesity who was initially admitted to [facility] 4/5 with complicated abdomen after a large abd surgery at an outside facility.  She underwent exp lap and multiple follow up surgeries for closure.  She had a prolonged, complicated hospital course including acute respiratory failure requiring tracheostomy placement.  She has been recovering slowly and has since tx to inpatient rehab where she is making some progress with therapy, tolerating passe muir valve.  PCCM now consulted regarding trach plan going forward.   PAST MEDICAL HISTORY :   has a past medical history of Goiter;  Hypothyroidism; Asthma; Hypertension; Atypical endometrial hyperplasia; Heart murmur; Vitamin B12 deficiency; Yeast infection; Gout; and COPD (chronic obstructive pulmonary disease) ([place]).  has past surgical history that includes Neck surgery (1994); Tonsillectomy; Cholecystectomy (2009); Robotic assisted total hysterectomy with bilateral salpingo oophorectomy (11/18/2012); Colostomy revision (N/A, 03/28/2016); laparotomy (N/A, 03/28/2016); Omentectomy (N/A, 03/28/2016); Wound debridement (N/A, 03/28/2016); Vacuum assisted closure change (N/A, 03/30/2016); Colostomy (N/A, 03/30/2016); Colostomy revision (N/A, 03/30/2016); laparotomy (N/A, 04/02/2016); Application if wound vac (N/A, 04/02/2016); laparotomy (N/A, 04/04/2016); laparotomy (N/A, 04/18/2016); and Abdominal wall defect repair (N/A, 04/18/2016). Prior to Admission medications   Medication Sig Start Date End Date Taking? Authorizing Provider  ADVAIR DISKUS 250-50 MCG/DOSE AEPB Inhale 1 puff into the lungs 2 (two) times daily. Once in the am & once at pm 01/06/14  Yes Historical Provider, MD  albuterol (PROVENTIL HFA;VENTOLIN HFA) 108 (90 BASE) MCG/ACT inhaler Inhale 2 puffs into the lungs every 6 (six) hours as needed. Wheezing and shortness of breath   Yes Historical Provider, MD  ergocalciferol (VITAMIN D2) 50000 UNITS capsule Take 50,000 Units by mouth once a week.   Yes Historical Provider, MD  Febuxostat (ULORIC) 80 MG TABS Take 80 mg by mouth daily before breakfast.    Yes Historical Provider, MD  hydrochlorothiazide (MICROZIDE) 12.5 MG capsule Take 12.5-25 mg by mouth as directed. Take 1 capsule Tuesday, Thursday, Saturday, & Sunday Take 2 capsules all other days   Yes Historical Provider, MD  levothyroxine (SYNTHROID, LEVOTHROID) 200 MCG tablet Take 200 mcg by mouth daily before breakfast.    Yes Historical Provider, MD  lisinopril (PRINIVIL,ZESTRIL) 20 MG tablet Take 20 mg by mouth daily.   Yes Historical Provider, MD  nystatin cream (MYCOSTATIN) Apply  topically 2 (two) times daily. For yeast infection   Yes Historical Provider, MD   Allergies  Allergen Reactions  . Penicillins Anaphylaxis, Hives, Shortness Of Breath, Swelling and Other (See Comments)    Has patient had a PCN reaction causing immediate rash, facial/tongue/throat swelling, SOB or lightheadedness with hypotension: No Has patient had a PCN reaction causing severe rash involving mucus membranes or skin necrosis: No Has patient had a PCN reaction that required hospitalization No Has patient had a PCN reaction occurring within the last 10 years: No If all of the above answers are "NO", then may proceed with Cephalosporin use.    FAMILY HISTORY:  family history includes COPD in her mother; Congestive Heart Failure in her mother; Diabetes in her father and mother; Heart attack in her father. SOCIAL HISTORY:  reports that she has been smoking Cigarettes.  She has a 28 pack-year smoking history. She has never used smokeless tobacco. She reports that she drinks about 0.5 - 1.5 oz of alcohol per week. She reports that she does not use illicit drugs.  REVIEW OF SYSTEMS:   As per HPI - All other systems reviewed and were neg.   SUBJECTIVE:  Pt tolerating capped trach denies shortness of breath at rest. She is tolerating diet and ambulation with PT.    VITAL SIGNS: Temp:  [98.2 F (36.8 C)] 98.2 F (36.8 C) (05/19 0511) Pulse Rate:  [68-79] 68 (05/19 0511) Resp:  [16-18] 18 (05/19 0511) BP: (150)/(64) 150/64 mmHg (05/19 0511) SpO2:  [92 %-100 %] 92 % (05/19 1104) FiO2 (%):  [28 %] 28 % (05/19 1104) Weight:  [292 lb (132.45 kg)] 292 lb (132.45 kg) (05/19 0511)  PHYSICAL EXAMINATION: General:  Obese female, NAD, pleasant Neuro:  Awake, alert, appropriate, MAE  HEENT:  Mm moist, no JVD, capped #6 shiley trach Cardiovascular:  s1s2 rrr Lungs:  resps even non labored on ATC, clear throughout Abdomen:  Obese, soft, dressing clean, ostomy with stool  Musculoskeletal:  1+ BLE  edema   No results for input(s): NA, K, CL, CO2, BUN, CREATININE, GLUCOSE in the last 168 hours.  Recent Labs Lab 05/07/16 0713  HGB 7.6*  HCT 25.9*  WBC 6.2  PLT 232   No results found.  ASSESSMENT / PLAN:  Chronic trach dependent respiratory failure -- post prolonged hospital course r/t complicated abd surgery, peritonitis with underlying COPD, OSA.  Tolerating trach cap for close to 48 hrs. Did well on CPAP last night Decannulated without any issue Pt is comfortable  Reccs:  Stoma care per nursing Can hold on the CPAP till stoma closes PCCM will sign off. Please call with any questions.  Marshell Garfinkel MD [facility] Pager [phone] If no answer or after 3pm call: [phone] 05/11/2016, 1:12 PM

## 2016-05-11 NOTE — Progress Notes (Signed)
Physical Therapy Session Note  Patient Details  Name: Christina Castaneda MRN: [id number] Date of Birth: Aug 22, 1956  Today's Date: 05/11/2016 PT Individual Time: 0830-0900 PT Individual Time Calculation (min): 30 min   Short Term Goals: Week 2:  PT Short Term Goal 1 (Week 2): Pt will be able to progress sit <> stands to consistent min assist with walker PT Short Term Goal 2 (Week 2): Pt will be able to gait x 20' with min assist PT Short Term Goal 3 (Week 2): Pt will be able to initiate stair negotiation training  Skilled Therapeutic Interventions/Progress Updates:   Pt just received medication from RN and agreeable to session stating she slept well last night. Pt engaged in bed mobility with trial to L to simulate home environment and without rails and pt able to perform at supervision level with extra time and cues for technique to decrease pressure to abdomen. Once EOB pt reported some lightheadedness and as she sat up became increasingly nauseous and ultimately vomitted. Questioned patient in regards to lightheadedness feeling like that or room spinning or any h/o of vertigo as her symptoms often appear to coincide with transitional positional changes (PT yesterday noted some nystagmus and dizziness with rolling). Recommended vestibular screen by clinical specialist and educated patient on vestibular system and how this could present with symptoms. Pt agreeable and notified scheduling team. Ultimately pt missed 30 min due to N/v. Returned back to supine position (no symptoms at that point) and set-up with all needs in reach. RN notified as well. Recommended for patient to trial slow movement next time she gets to EOB and maintain eyes fixed on target to see if that helps with some symptoms - pt agreeable.   Therapy Documentation Precautions:  Precautions Precautions: Fall, Other (comment) Precaution Comments: Trach capped as of 5/18 on room air Required Braces or Orthoses: Other  Brace/Splint Other Brace/Splint: abdominal binder loosely over abdominal wound and ostomy site Restrictions Weight Bearing Restrictions: No General: PT Amount of Missed Time (min): 30 Minutes PT Missed Treatment Reason: Patient ill (Comment) (N/V)  Pain:  premedicated for abdominal pain due to wound site.    See Function Navigator for Current Functional Status.   Therapy/Group: Individual Therapy  Canary Brim [name], PT, DPT  05/11/2016, 9:14 AM

## 2016-05-11 NOTE — Progress Notes (Signed)
Occupational Therapy Session Note  Patient Details  Name: Christina Castaneda MRN: [id number] Date of Birth: 1956-02-10  Today's Date: 05/11/2016 OT Individual Time:  - 0930-1000  (30 min)  1st session                                         1345-1500 ( 75 min)   2nd session         Short Term Goals: Week 1:  OT Short Term Goal 1 (Week 1): Pt will be able to sit to stand from toilet with mod A.  OT Short Term Goal 1 - Progress (Week 1): Partly met OT Short Term Goal 2 (Week 1): Pt will perform toileting with mod A for clothing management. OT Short Term Goal 2 - Progress (Week 1): Not met OT Short Term Goal 3 (Week 1): Pt will don pants with mod A. OT Short Term Goal 3 - Progress (Week 1): Discontinued (comment) (Pt does not wear pants) OT Short Term Goal 4 (Week 1): Pt will tolerate standing at the sink for 1 min. OT Short Term Goal 4 - Progress (Week 1): Not met Week 2:  OT Short Term Goal 1 (Week 2): Pt will complete functional transfers with supervision consistently OT Short Term Goal 2 (Week 2): Pt will tolerate full OT bathing/ dressing with only 1 rest break in order to increase functional activity tolerance OT Short Term Goal 3 (Week 2): Pt will complete toileting task with supervision OT Short Term Goal 4 (Week 2): Pt will complete one grooming task in standing in order to increase activity tolerance.   Skilled Therapeutic Interventions/Progress Updates:   1st session:   Pt lying in bed.   Skilled OT intervention for Teacher, English as a foreign language;Functional mobility training;Neuromuscular re-education;Pain management;Psychosocial support;Skin care/wound management;;Therapeutic Activities;Therapeutic Exercise;UE/LE Strength training/ROM;UE/LE Coordination activities.  Pt seen by OT emphasis on sitting balance, and functional task of emptying ostomy bag.   Pt in supine upon arrival, voicing increased fatigue, however, with encouragement pt willing to  attempt session. She went from supine to sit with SBA.   Sat EOB and engaged in emptying ostomy bag.  Needed increased time and assist with set up.   Pt's pain coupled with anxiety.  Pt would feel bad 10/10 and then be talkative and t seemed ok during different periods of session.    Engaged pt in therapeutic breathing and gaze stabilization for better adaptation while sitting EOB.  .  Returned to supine after treatment and left with all needs in reach.   2nd session:  Skilled OT intervention for Balance/vestibular training;DME/adaptive equipment instruction;Functional mobility training;Neuromuscular re-education;Pain management;;Psychosocial support;Skin care/wound management;;Therapeutic Activities;Therapeutic Exercise;UE/LE Strength rtaining/ROM;UE/LE Coordination activities.  Pt required much encouragement in beginning to participate but then less as session progressed.  Pt engaged in 2# bar exercises, LE leg raises with 1 # ankle weights.  She sat EOB and allow hair to be combed and rebraided while adhering to breathing and gaze stabilization strategies.  Pt sat EOB for 30 minutes.  She engaged in leg raises x20 and cross overs x20 with 1 # weights.  She would not attempt standing or transfers.  Pt engaged in telling travel adventures and seemed better with thinking on pleasant events.  Pt brushed teeth and broke tooth whule brushing teeth.  Placed tooth in small plastic bag and put in suitcase.  Pt. returned to supine after  treatment and left with all needs in reach.  Therapy Documentation Precautions:  Precautions Precautions: Fall, Other (comment) Precaution Comments: Trach capped as of 5/18 on room air Required Braces or Orthoses: Other Brace/Splint Other Brace/Splint: abdominal binder loosely over abdominal wound and ostomy site Restrictions Weight Bearing Restrictions: No General:   Vital Signs: Therapy Vitals Temp: 98.2 F (36.8 C) Temp Source: Oral Pulse Rate: 68 Resp: 18 BP: (!)  150/64 mmHg Patient Position (if appropriate): Lying Oxygen Therapy SpO2: 100 % O2 Device: CPAP Pain:dizziness 8/10; stomach pain 7/10  1st and 2nd session   ADL: ADL ADL Comments: refer to functional navigator     See Function Navigator for Current Functional Status.   Therapy/Group: Individual Therapy  Lisa Roca 05/11/2016, 7:53 AM

## 2016-05-11 NOTE — Progress Notes (Signed)
[place] PHYSICAL MEDICINE & REHABILITATION     PROGRESS NOTE    Subjective/Complaints: Slept pretty well with auto-pap.  Still fatigues easily. No breathing issues.   ROS: Pt denies fever, rash/itching, headache, blurred or double vision, nausea, vomiting,   diarrhea, chest pain, shortness of breath, palpitations, dysuria, dizziness, neck or back pain, bleeding, or depression    Objective: Vital Signs: Blood pressure 150/64, pulse 68, temperature 98.2 F (36.8 C), temperature source Oral, resp. rate 18, height 5\' 7"  (1.702 m), weight 132.45 kg (292 lb), last menstrual period 11/18/2012, SpO2 100 %. No results found. No results for input(s): WBC, HGB, HCT, PLT in the last 72 hours. No results for input(s): NA, K, CL, GLUCOSE, BUN, CREATININE, CALCIUM in the last 72 hours.  Invalid input(s): CO CBG (last 3)  No results for input(s): GLUCAP in the last 72 hours.  Wt Readings from Last 3 Encounters:  05/11/16 132.45 kg (292 lb)  05/02/16 141 kg (310 lb 13.6 oz)  01/07/14 138.347 kg (305 lb)    Physical Exam:  Constitutional: She is oriented to person, place, and time. She appears well-developed and well-nourished.  Morbidly obese female  HENT: ear wax Head: Normocephalic and atraumatic.  Eyes: Conjunctivae and EOM are normal. Pupils are equal, round, and reactive to light.  Neck:  #6 trach with PMV. good phonation   Cardiovascular: Normal rate and regular rhythm.  No murmur heard. Respiratory: Effort normal. No stridor. She has decreased breath sounds in the right lower field and the left lower field. She has wheezes. She has rhonchi.  GI: Soft. Bowel sounds are normal. She exhibits no distension. There is tenderness.  Ostomy with  formed stool. area dry surrounding Musculoskeletal: She exhibits edema. She exhibits no tenderness.  Neurological: She is alert and oriented to person, place, and time.  Motor:  Bilateral upper extremities 4+/5 proximal to  distal Bilateral lower extremities hip flexion, knee extension 4-/5, ankle dorsi/plantarflexion 4+/5 Sensation diminished to light touch bilateral feet  Skin: Skin is warm and dry.  Midline abdominal incision with retention sutures/granulation tissue around 50%--remainder fibronecrotic. Lower wound with 100% granulation. Drainage much improved. Area tender to palpation. Surrounding skin less macerated    Psychiatric: Her speech is normal. Her affect is appropriate at the moment     Assessment/Plan: 1. Debility secondary to weakness from multiple medical issues/respiratory failure which require 3+ hours per day of interdisciplinary therapy in a comprehensive inpatient rehab setting. Physiatrist is providing close team supervision and 24 hour management of active medical problems listed below. Physiatrist and rehab team continue to assess barriers to discharge/monitor patient progress toward functional and medical goals.  Function:  Bathing Bathing position   Position: Wheelchair/chair at sink  Bathing parts Body parts bathed by patient: Right arm, Left arm, Chest, Abdomen, Right upper leg, Left upper leg, Right lower leg, Left lower leg, Front perineal area Body parts bathed by helper: Back  Bathing assist Assist Level: Touching or steadying assistance(Pt > 75%)      Upper Body Dressing/Undressing Upper body dressing   What is the patient wearing?: Button up shirt     Pull over shirt/dress - Perfomed by patient: Thread/unthread right sleeve, Thread/unthread left sleeve, Put head through opening, Pull shirt over trunk Pull over shirt/dress - Perfomed by helper: Pull shirt over trunk Button up shirt - Perfomed by patient: Thread/unthread right sleeve, Thread/unthread left sleeve, Pull shirt around back, Button/unbutton shirt Button up shirt - Perfomed by helper: Button/unbutton shirt  Upper body assist Assist Level: Set up   Set up : To obtain clothing/put away  Lower Body  Dressing/Undressing Lower body dressing   What is the patient wearing?: Non-skid slipper socks     Pants- Performed by patient: Thread/unthread right pants leg, Thread/unthread left pants leg Pants- Performed by helper: Pull pants up/down Non-skid slipper socks- Performed by patient: Don/doff right sock, Don/doff left sock Non-skid slipper socks- Performed by helper: Don/doff left sock, Don/doff right sock                  Lower body assist Assist for lower body dressing: Touching or steadying assistance (Pt > 75%) (in standing wtih Stedy to pull up pants)      Toileting Toileting   Toileting steps completed by patient: Performs perineal hygiene, Adjust clothing after toileting Toileting steps completed by helper: Adjust clothing prior to toileting Toileting Assistive Devices: Other (comment) (SW)  Toileting assist Assist level: Touching or steadying assistance (Pt.75%)   Transfers Chair/bed transfer   Chair/bed transfer method: Stand pivot Chair/bed transfer assist level: Moderate assist (Pt 50 - 74%/lift or lower) Chair/bed transfer assistive device: Walker, Armrests Mechanical lift: Ecologist Ambulation activity did not occur: Safety/medical concerns   Max distance: 10' Assist level: Touching or steadying assistance (Pt > 75%)   Wheelchair   Type: Manual Max wheelchair distance: 97ft  Assist Level: Supervision or verbal cues  Cognition Comprehension Comprehension assist level: Follows complex conversation/direction with no assist  Expression Expression assist level: Expresses complex ideas: With no assist  Social Interaction Social Interaction assist level: Interacts appropriately with others with medication or extra time (anti-anxiety, antidepressant).  Problem Solving Problem solving assist level: Solves complex problems: Recognizes & self-corrects  Memory Memory assist level: Recognizes or recalls 90% of the time/requires cueing < 10% of the  time   Medical Problem List and Plan: 1. Weakness, decreased endurance secondary to debility from multiple medical issues  -continue CIR-  -continue to work through [place] if she wants to achieve goals 2. DVT Prophylaxis/Anticoagulation: Pharmaceutical: Lovenox  -remains indicated  -dopplers negative 3. Pain Management: continue Fentanyl patch 100 mcg with oxycodone prn for pain  -states that pain is predominantly abdominal   -having some pain in legs with numbness, likely peripheral neuropathy with stocking glove sensory loss   -reviewed with patient potential causes and treatment yet again today---she doesn't recall [place] discussing before. Was satisfied with my answers. STM deficits 4. Mood: LCSW to follow for evaluation and support.   -continued scheduled low dose xanax to assist with anxiety  5. Neuropsych: This patient is capable of making decisions on her own behalf. 6. Skin/Wound Care: gradual improvement/ drainage much less .   -wound dressings per surgery.    -ostomy competent 7. Fluids/Electrolytes/Nutrition: Monitor I/O.   Renal status stable. Add supplements to help manage low protein stores. 8. ABLA: H/H stable at 7.6-8.0 range.---7.6 most recently - iron deficiency anemia  -B12 normal.   -Fe++ supp    9. COPD/OSA in setting of morbid obesity:  Continue brovana bid and Pulmicort bid.   - trach capped. auto-pap at night  -decannulate soon 10. Hypothyroid: On supplement.  11. Morbid Obesity Body mass index is 50.2 kg/(m^2). Diet and exercise education. Pt was highly sedentary PTA 12. Tobacco abuse: Continue counseling 13. Bilateral knee pain Will continue to monitor and adjust medications as necessary 14. Hypothyroidism Continue medications  -TSH elevated by free T4 normal/high--no changes 15. Ear wax---debrox completed with good  results    LOS (Days) 9 A FACE TO FACE EVALUATION WAS  PERFORMED  Janifer Gieselman T 05/11/2016 8:53 AM

## 2016-05-11 NOTE — Progress Notes (Signed)
Occupational Therapy Session Note  Patient Details  Name: Christina Castaneda MRN: [id number] Date of Birth: [birth year]  Today's Date: 05/11/2016 OT Individual Time: 3968-8648 OT Individual Time Calculation (min): 36 min    Short Term Goals: Week 1:  OT Short Term Goal 1 (Week 1): Pt will be able to sit to stand from toilet with mod A.  OT Short Term Goal 1 - Progress (Week 1): Partly met OT Short Term Goal 2 (Week 1): Pt will perform toileting with mod A for clothing management. OT Short Term Goal 2 - Progress (Week 1): Not met OT Short Term Goal 3 (Week 1): Pt will don pants with mod A. OT Short Term Goal 3 - Progress (Week 1): Discontinued (comment) (Pt does not wear pants) OT Short Term Goal 4 (Week 1): Pt will tolerate standing at the sink for 1 min. OT Short Term Goal 4 - Progress (Week 1): Not met  Skilled Therapeutic Interventions/Progress Updates:    OT session focused on sit<>stand, activity tolerance, and education. Pt received supine in bed following breathing treatment, requiring max cues of encouragement for participation due to fatigue. With increased time, pt completed supine>sit with mod A and HOB flat. Pt reported 4-5/10 dizziness, requiring extended time before completing sit<>stand. Pt completed sit<>stand 2x with mod-max assist and long rest break between. Practiced sit<>stand final time with pt completing only partial stand. Pt declined further participation, returning to supine with mod assist. Pt left with all needs in reach.  Therapy Documentation Precautions:  Precautions Precautions: Fall, Other (comment) Precaution Comments: Trach capped as of 5/18 on room air Required Braces or Orthoses: Other Brace/Splint Other Brace/Splint: abdominal binder loosely over abdominal wound and ostomy site Restrictions Weight Bearing Restrictions: No General: General OT Amount of Missed Time: 24 Minutes PT Missed Treatment Reason: Patient ill (Comment) (N/V) Vital  Signs: Oxygen Therapy SpO2: 92 % O2 Device: CPAP O2 Flow Rate (L/min): 5 L/min FiO2 (%): 28 % Pain:  See Function Navigator for Current Functional Status.   Therapy/Group: Individual Therapy  [name], Quillian Quince 05/11/2016, 12:00 PM

## 2016-05-12 ENCOUNTER — Inpatient Hospital Stay (HOSPITAL_COMMUNITY): Payer: BLUE CROSS/BLUE SHIELD | Admitting: Occupational Therapy

## 2016-05-12 DIAGNOSIS — R531 Weakness: Secondary | ICD-10-CM

## 2016-05-12 DIAGNOSIS — R5381 Other malaise: Secondary | ICD-10-CM

## 2016-05-12 DIAGNOSIS — G894 Chronic pain syndrome: Secondary | ICD-10-CM

## 2016-05-12 DIAGNOSIS — Z933 Colostomy status: Secondary | ICD-10-CM

## 2016-05-12 NOTE — Progress Notes (Signed)
Patient ID: PAOLA KARAN, female   DOB: 30-Sep-1956, 60 y.o.   MRN: [id number]  05/12/16.   [place] PHYSICAL MEDICINE & REHABILITATION     PROGRESS NOTE   60 y/o admit for CIR with weakness, decreased endurance secondary to debility from multiple medical issues  Subjective/Complaints: Slept pretty well with auto-pap.  Still not tolerating mask as well as home unit.  C/o hard stool  ROS: Pt denies fever, rash/itching, headache, blurred or double vision, nausea, vomiting,   diarrhea, chest pain, shortness of breath, palpitations, dysuria, dizziness, neck or back pain, bleeding, or depression  Past Medical History  Diagnosis Date  . Goiter   . Hypothyroidism   . Asthma     mild  . Hypertension   . Atypical endometrial hyperplasia   . Heart murmur   . Vitamin B12 deficiency   . Yeast infection     for last 3 weeks  . Gout   . COPD (chronic obstructive pulmonary disease) (HCC)      Objective: Vital Signs: Blood pressure 147/70, pulse 53, temperature 98.1 F (36.7 C), temperature source Oral, resp. rate 17, height 5\' 7"  (1.702 m), weight 292 lb (132.45 kg), last menstrual period 11/18/2012, SpO2 96 %. No results found. No results for input(s): WBC, HGB, HCT, PLT in the last 72 hours. No results for input(s): NA, K, CL, GLUCOSE, BUN, CREATININE, CALCIUM in the last 72 hours.  Invalid input(s): CO    Wt Readings from Last 3 Encounters:  05/11/16 292 lb (132.45 kg)  05/02/16 310 lb 13.6 oz (141 kg)  01/07/14 305 lb (138.347 kg)    Physical Exam:  Constitutional: She is oriented to person, place, and time. She appears well-developed and well-nourished.  Morbidly obese female  HENT: ear wax Head: Normocephalic and atraumatic.  Eyes: Conjunctivae and EOM are normal. Pupils are equal, round, and reactive to light.  Neck:  Healing trach site  Cardiovascular: Normal rate and regular rhythm.  No murmur heard. Respiratory: Effort normal. No stridor. She has decreased  breath sounds in the right lower field and the left lower field. She has wheezes. She has rhonchi.  GI: Soft. Bowel sounds are normal. She exhibits no distension. There is tenderness.  Ostomy with firm  formed stool. area dry surrounding Musculoskeletal: She exhibits edema. She exhibits no tenderness.  Neurological: She is alert and oriented to person, place, and time.  Motor:  Bilateral upper extremities 4+/5 proximal to distal Extr- SCDs in place  Skin: Skin is warm and dry.  Midline abdominal incision  Psychiatric: Her speech is normal. Her affect is appropriate at the moment       Medical Problem List and Plan: 1. Weakness, decreased endurance secondary to debility from multiple medical issues  -continue CIR-  2. DVT Prophylaxis/Anticoagulation: Pharmaceutical: Lovenox  -remains indicated  -dopplers negative 3. Pain Management: continue Fentanyl patch 100 mcg with oxycodone prn for pain  -states that pain is predominantly abdominal     4. Skin/Wound Care: gradual improvement/ drainage much less .   -wound dressings per surgery.    -ostomy competent  5. ABLA: H/H stable at 7.6-8.0 range.---7.6 most recently - iron deficiency anemia  -B12 normal.   -Fe++ supp    6. COPD/OSA in setting of morbid obesity:  Continue brovana bid and Pulmicort bid.       LOS (Days) 10 A FACE TO FACE EVALUATION WAS PERFORMED  Nyoka Cowden 05/12/2016 9:21 AM

## 2016-05-12 NOTE — Progress Notes (Signed)
Occupational Therapy Session Note  Patient Details  Name: Christina Castaneda MRN: [id number] Date of Birth: [birth year]  Today's Date: 05/12/2016 OT Individual Time:  -   1030-1115   (45 min)      Short Term Goals: Week 1:  OT Short Term Goal 1 (Week 1): Pt will be able to sit to stand from toilet with mod A.  OT Short Term Goal 1 - Progress (Week 1): Partly met OT Short Term Goal 2 (Week 1): Pt will perform toileting with mod A for clothing management. OT Short Term Goal 2 - Progress (Week 1): Not met OT Short Term Goal 3 (Week 1): Pt will don pants with mod A. OT Short Term Goal 3 - Progress (Week 1): Discontinued (comment) (Pt does not wear pants) OT Short Term Goal 4 (Week 1): Pt will tolerate standing at the sink for 1 min. OT Short Term Goal 4 - Progress (Week 1): Not met Week 2:  OT Short Term Goal 1 (Week 2): Pt will complete functional transfers with supervision consistently OT Short Term Goal 2 (Week 2): Pt will tolerate full OT bathing/ dressing with only 1 rest break in order to increase functional activity tolerance OT Short Term Goal 3 (Week 2): Pt will complete toileting task with supervision OT Short Term Goal 4 (Week 2): Pt will complete one grooming task in standing in order to increase activity tolerance.   Skilled Therapeutic Interventions/Progress Updates:    Pt. Lying in bed.  Husband Louie Casa present.  Pt agreed to get up.  Bathed LB supine with rolling.  Pt was SBA with EOB. Sitting balance.  She transferred to recliner with min asssit.    She washed UB, brushed teeth at sink.  Donned house dress.  Pt agreed to stay in recliner at end of session.    Therapy Documentation Precautions:  Precautions Precautions: Fall, Other (comment) Precaution Comments: Trach capped as of 5/18 on room air Required Braces or Orthoses: Other Brace/Splint Other Brace/Splint: abdominal binder loosely over abdominal wound and ostomy site Restrictions Weight Bearing Restrictions:  No General:   Vital Signs: Oxygen Therapy SpO2: 96 % O2 Device: Not Delivered Pain: Pain Assessment Pain Assessment: 0-10 Pain Score:2 Pain Type: Acute pain Pain Location: Abdomen Pain Orientation: Mid Pain Descriptors / Indicators: Aching Pain Frequency: Intermittent Pain Onset: Gradual Patients Stated Pain Goal: 1 Pain Intervention(s): Medication (See eMAR) ADL: ADL ADL Comments: refer to functional navigator     See Function Navigator for Current Functional Status.   Therapy/Group: Individual Therapy  Lisa Roca 05/12/2016, 11:10 AM

## 2016-05-13 NOTE — Progress Notes (Signed)
Patient ID: Christina Castaneda, female   DOB: 10/20/1956, 60 y.o.   MRN: [id number]   Patient ID: Christina Castaneda, female   DOB: 11/30/1956, 60 y.o.   MRN: [id number]  05/13/16.   [facility] PHYSICAL MEDICINE & REHABILITATION     PROGRESS NOTE   60 y/o admit for CIR with weakness, decreased endurance secondary to debility from multiple medical issues  Subjective/Complaints: Slept pretty well with auto-pap.  Hard stool has rresolved   ROS: Pt denies fever, rash/itching, headache, blurred or double vision, nausea, vomiting,   diarrhea, chest pain, shortness of breath, palpitations, dysuria, dizziness, neck or back pain, bleeding, or depression  Past Medical History  Diagnosis Date  . Goiter   . Hypothyroidism   . Asthma     mild  . Hypertension   . Atypical endometrial hyperplasia   . Heart murmur   . Vitamin B12 deficiency   . Yeast infection     for last 3 weeks  . Gout   . COPD (chronic obstructive pulmonary disease) (HCC)      Objective: Vital Signs: Blood pressure 146/57, pulse 55, temperature 97.8 F (36.6 C), temperature source Oral, resp. rate 18, height 5\' 7"  (1.702 m), weight 292 lb (132.45 kg), last menstrual period 11/18/2012, SpO2 95 %. No results found. No results for input(s): WBC, HGB, HCT, PLT in the last 72 hours. No results for input(s): NA, K, CL, GLUCOSE, BUN, CREATININE, CALCIUM in the last 72 hours.  Invalid input(s): CO    Wt Readings from Last 3 Encounters:  05/11/16 292 lb (132.45 kg)  05/02/16 310 lb 13.6 oz (141 kg)  01/07/14 305 lb (138.347 kg)    Physical Exam:  Constitutional: She is oriented to person, place, and time. She appears well-developed and well-nourished.  Morbidly obese female  HENT: ear wax Head: Normocephalic and atraumatic.  Eyes: Conjunctivae and EOM are normal. Pupils are equal, round, and reactive to light.  Neck:  Healing trach site  Cardiovascular: Normal rate and regular rhythm. Gr 2/6 SEM over primary aortic  area  Respiratory: Effort normal. No stridor. She has decreased breath sounds in the right lower field and the left lower field. She has  Faint wheezing anteriorly wheezes.  GI: Soft. Bowel sounds are normal. She exhibits no distension. There is tenderness.  Ostomy with soft  formed stool. area dry surrounding Musculoskeletal: She exhibits edema. She exhibits no tenderness.  Neurological: She is alert and oriented to person, place, and time.  Motor:  Bilateral upper extremities 4+/5 proximal to distal Extr- SCDs in place  Skin: Skin is warm and dry.  Midline abdominal incision  Psychiatric: Her speech is normal. Her affect is appropriate at the moment       Medical Problem List and Plan: 1. Weakness, decreased endurance secondary to debility from multiple medical issues  -continue CIR-  2. DVT Prophylaxis/Anticoagulation: Pharmaceutical: Lovenox  -remains indicated  -dopplers negative 3. Pain Management: continue Fentanyl patch 100 mcg with oxycodone prn for pain  -states that pain is predominantly abdominal     4. Skin/Wound Care: gradual improvement/ drainage much less .   -wound dressings per surgery.    -ostomy competent  5. ABLA: H/H stable at 7.6-8.0 range.---7.6 most recently - iron deficiency anemia  -B12 normal.   -Fe++ supp    6. COPD/OSA in setting of morbid obesity:  Continue brovana bid and Pulmicort bid.       LOS (Days) 11 A FACE TO FACE EVALUATION WAS PERFORMED  Nyoka Cowden 05/13/2016 8:45 AM

## 2016-05-13 NOTE — Procedures (Signed)
Placed patient on CPAP for the night.  Patient is tolerating well at this time. 

## 2016-05-14 ENCOUNTER — Inpatient Hospital Stay (HOSPITAL_COMMUNITY): Payer: BLUE CROSS/BLUE SHIELD | Admitting: Physical Therapy

## 2016-05-14 ENCOUNTER — Inpatient Hospital Stay (HOSPITAL_COMMUNITY): Payer: BLUE CROSS/BLUE SHIELD | Admitting: Occupational Therapy

## 2016-05-14 ENCOUNTER — Inpatient Hospital Stay (HOSPITAL_COMMUNITY): Payer: BLUE CROSS/BLUE SHIELD

## 2016-05-14 LAB — GLUCOSE, CAPILLARY: Glucose-Capillary: 86 mg/dL (ref 65–99)

## 2016-05-14 NOTE — Progress Notes (Signed)
Physical Therapy Vestibular Assessment and Session Note  Patient Details  Name: Christina Castaneda MRN: [id number] Date of Birth: 22-Oct-1956  Today's Date: 05/14/2016 PT Individual Time: 1309-1410 PT Individual Time Calculation (min): 61 min   Short Term Goals: Week 1:  PT Short Term Goal 1 (Week 1): Pt will be able to perform sit <> stands with mod assist using RW PT Short Term Goal 1 - Progress (Week 1): Met PT Short Term Goal 2 (Week 1): Pt will be able to initiate gait with +2 assist PT Short Term Goal 2 - Progress (Week 1): Met PT Short Term Goal 3 (Week 1): Pt will be able to tolerate OOB x 1 hour between therapy sessions to increase overall endurance/activity tolerance PT Short Term Goal 3 - Progress (Week 1): Met  Skilled Therapeutic Interventions/Progress Updates:   Pt seen for vestibular screen/assessment due to pt reports of dizziness interfering with mobility.  See below for details.  At end of session pt handed off to OT; discussed findings and recommendations for initiation of treatment.  Will discuss with primary PT and attempt modified Hallpike tomorrow with use of bed controls.    1. History and Physical Examination    Pt reports chronic but intermittent spells of "room spinning" vertigo since her teenage years when she would get "swimmer's ear."  Pt reports frequent ear infections and intermittent use of antibiotics.  When pt has an acute episode of vertigo she reports it is sudden and can happen when sitting, standing, during bed mobility, when washing her hair in the shower and has happened while driving.  Each episode lasts for a few minutes, resolves but can occur intermittently over a few days-few weeks.  Pt reports general changes in vision including glaucoma and cataracts; she denies blurring of vision or diplopia but does report tunnel vision when she becomes dizzy.  She also reports some oscillopsia during gait.  She denies sensitivity to light or sound and denies a  headache.  She does report nausea.  She denies tinnitus but does report feeling like her ear was in a "tunnel" but this resolved with drops.     Glasses: Y Hearing aids: N   H/O falls: No   Sensation: Reports some tingling in both sets of toes   2. Vestibular Assessment                           Gross neck ROM WFL but h/o neck surgery  Eye Alignment Bell's Palsy L, ptosis  Oculomotor ROM WFL  Spontaneous  Nystagmus (room light and vision occluded) None  Gaze holding nystagmus(room light and vision occluded) None  Smooth pursuit Slightly saccadic  Saccades Delayed, undershooting  Vergence WFL  VOR Cancellation WFL  Pressure Tests (vision occluded) N/T  VOR slow Impaired and symptomatic  Head Thrust Test [facility]  Head Shaking Test (vision occluded) Very symptomatic but no nystagmus noted  Dynamic Visual Acuity         4 line difference, impaired  Rt. Hallpike Dix TBA tomorrow  Lt. Hallpike Dix TBA tomorrow  Rt. Roll Test TBA tomorrow  Lt. Roll Test  TBA tomorrow  MSQ TBA tomorrow  Cover-Cross Cover (if indicated) N/T  Head-Neck Differentiation Test (if indicated) TBA tomorrow    3. 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    4. Recommendations for Treatment: Habituation with Gaze Stability: x 1 viewing in sitting and supported standing in pitch and yaw planes with pt performing in slow enough movement to keep target in focus at 72f and 682fworking up to 30 seconds at a time for each direction.  Keep symptoms <3/10 and allow symptoms to return to baseline in between reps.  Also incorporate gaze stability and fixation (eye-head shifts) on target with bed mobility and all transfers/gait.  Use  of grounding activities and head movements in supported positions (supine) to minimize anxiety with head movements.  More recommendations to follow after tomorrow.      Therapy Documentation Precautions:  Precautions Precautions: Fall, Other (comment) Precaution Comments: Trach capped as of 5/18 on room air Required Braces or Orthoses: Other Brace/Splint Other Brace/Splint: abdominal binder loosely over abdominal wound and ostomy site Restrictions Weight Bearing Restrictions: No Vital Signs: Therapy Vitals Temp: 97.7 F (36.5 C) Temp Source: Oral Pulse Rate: 95 Resp: 20 BP: (!) 103/42 mmHg Patient Position (if appropriate): Sitting Oxygen Therapy SpO2: 95 % O2 Device: Not Delivered Pain: Pain Assessment Pain Score: 6  Pain Type: Acute pain Pain Location: Abdomen Pain Orientation: Mid Pain Descriptors / Indicators: Spasm;Discomfort Pain Onset: Sudden Pain Intervention(s): Rest  See Function Navigator for Current Functional Status.   Therapy/Group: Individual Therapy  HaRaylene EvertsaLaredo Laser And Surgery/22/2017, 4:43 PM

## 2016-05-14 NOTE — Progress Notes (Signed)
Physical Therapy Session Note  Patient Details  Name: [name] MRN: [id number] Date of Birth: 03-28-1956  Today's Date: 05/14/2016 PT Individual Time: 0900-1000 PT Individual Time Calculation (min): 60 min   Short Term Goals: Week 2:  PT Short Term Goal 1 (Week 2): Pt will be able to progress sit <> stands to consistent min assist with walker PT Short Term Goal 2 (Week 2): Pt will be able to gait x 20' with min assist PT Short Term Goal 3 (Week 2): Pt will be able to initiate stair negotiation training  Skilled Therapeutic Interventions/Progress Updates:   Session focused on bed mobility on flat bed bed with cues for maintain gaze on object during this to decrease dizziness (supervision level), functional transfers and sit <> stands with RW (min to light mod assist), short distance gait training with RW, and introduced stair negotiation for home entry practice. Pt required cues for sit <> stands with cues for hand placement and anterior weightshift. Pt able to ascend/descend 3" steps (1 step, then 2 steps) and attempted 6" steps but pt unable to step up. Performed alternating toe taps on 6" step x 2 reps each side x 2 sets with rest break seated between set. End of session transferred to recliner (placed w/c cushion in it for comfort) with min assist and husband assisted patient with set-up assist.   Discussed at start of session importance of patient getting up over the weekend and out of bed daily as she reports being in bed all weekend - pt verbalized understanding.   Therapy Documentation Precautions:  Precautions Precautions: Fall, Other (comment) Precaution Comments: Trach capped as of 5/18 on room air Required Braces or Orthoses: Other Brace/Splint Other Brace/Splint: abdominal binder loosely over abdominal wound and ostomy site Restrictions Weight Bearing Restrictions: No   Pain: Premedicated for abdominal pain.    See Function Navigator for Current Functional  Status.   Therapy/Group: Individual Therapy  Canary Brim [name], PT, DPT  05/14/2016, 11:26 AM

## 2016-05-14 NOTE — Progress Notes (Signed)
[facility]ICINE [facility] RE[facility]ILITATION     PROGRESS NOTE    Subjective/Complaints: Had a quiet weekend. Trach out---pleased by that. Feels that left knee is holding her back with PT, has the sensation that it's going to give out or lock up when she bears weight on it.    ROS: Pt denies fever, rash/itching, headache, blurred or double vision, nausea, vomiting,   diarrhea, chest pain, shortness of breath, palpitations, dysuria, dizziness, neck or back pain, bleeding, or depression    Objective: Vital Signs: Blood pressure 149/60, pulse 58, temperature 98.2 F (36.8 C), temperature source Oral, resp. rate 18, height 5\' 7"  (1.702 m), weight 135.172 kg (298 lb), last menstrual period 11/18/2012, SpO2 96 %. No results found. No results for input(s): WBC, HGB, HCT, PLT in the last 72 hours. No results for input(s): NA, K, CL, GLUCOSE, BUN, CREATININE, CALCIUM in the last 72 hours.  Invalid input(s): CO CBG (last 3)   Recent Labs  05/14/16 0645  GLUCAP 86    Wt Readings from Last 3 Encounters:  05/14/16 135.172 kg (298 lb)  05/02/16 141 kg (310 lb 13.6 oz)  01/07/14 138.347 kg (305 lb)    Physical Exam:  Constitutional: She is oriented to person, place, and time. She appears well-developed and well-nourished.  Morbidly obese female  HENT: ear wax Head: Normocephalic and atraumatic.  Eyes: Conjunctivae and EOM are normal. Pupils are equal, round, and reactive to light.  Neck:  Trach site closed, healing wound.   Cardiovascular: Normal rate and regular rhythm.  No murmur heard. Respiratory: Effort normal. No stridor. She has decreased breath sounds in the right lower field and the left lower field. She has wheezes. She has rhonchi.  GI: Soft. Bowel sounds are normal. She exhibits no distension. There is tenderness.  Ostomy with  formed stool. area dry surrounding Musculoskeletal: She exhibits edema. Crepitus with left knee ROM.  Valgus deformity Neurological: She is  alert and oriented to person, place, and time.  Motor:  Bilateral upper extremities 4+/5 proximal to distal Bilateral lower extremities hip flexion, knee extension 4-/5, ankle dorsi/plantarflexion 4+/5 Sensation diminished to light touch bilateral feet  Skin: Skin is warm and dry.  Midline abdominal incision with retention sutures/granulation tissue around 50%--remainder fibronecrotic. Lower wound with 100% granulation. Drainage much improved. Area tender to palpation. Surrounding skin less macerated    Psychiatric: Her speech is normal. Her affect is appropriate at the moment     Assessment/Plan: 1. Debility secondary to weakness from multiple medical issues/respiratory failure which require 3+ hours per day of interdisciplinary therapy in a comprehensive inpatient rehab setting. Physiatrist is providing close team supervision and 24 hour management of active medical problems listed below. Physiatrist and rehab team continue to assess barriers to discharge/monitor patient progress toward functional and medical goals.  Function:  Bathing Bathing position   Position: Bed  Bathing parts Body parts bathed by patient: Right arm, Left arm, Chest, Front perineal area, Right upper leg, Left upper leg, Right lower leg, Left lower leg Body parts bathed by helper: Buttocks, Back  Bathing assist Assist Level: Touching or steadying assistance(Pt > 75%)      Upper Body Dressing/Undressing Upper body dressing   What is the patient wearing?: Pull over shirt/dress     Pull over shirt/dress - Perfomed by patient: Thread/unthread right sleeve, Thread/unthread left sleeve, Put head through opening Pull over shirt/dress - Perfomed by helper: Pull shirt over trunk Button up shirt - Perfomed by patient: Thread/unthread right  sleeve, Thread/unthread left sleeve, Pull shirt around back, Button/unbutton shirt Button up shirt - Perfomed by helper: Button/unbutton shirt    Upper body assist Assist Level:  Set up   Set up : To obtain clothing/put away  Lower Body Dressing/Undressing Lower body dressing   What is the patient wearing?: Non-skid slipper socks     Pants- Performed by patient: Thread/unthread right pants leg, Thread/unthread left pants leg Pants- Performed by helper: Pull pants up/down Non-skid slipper socks- Performed by patient: Don/doff right sock, Don/doff left sock Non-skid slipper socks- Performed by helper: Don/doff right sock, Don/doff left sock                  Lower body assist Assist for lower body dressing: Touching or steadying assistance (Pt > 75%)      Toileting Toileting   Toileting steps completed by patient: Performs perineal hygiene Toileting steps completed by helper: Adjust clothing after toileting, Performs perineal hygiene, Adjust clothing prior to toileting Toileting Assistive Devices: Other (comment) (used bed pan due to urgency and pain)  Toileting assist Assist level: More than reasonable time   Transfers Chair/bed transfer   Chair/bed transfer method: Stand pivot Chair/bed transfer assist level: Moderate assist (Pt 50 - 74%/lift or lower) Chair/bed transfer assistive device: Walker, Armrests Mechanical lift: Ecologist Ambulation activity did not occur: Safety/medical concerns   Max distance: 10' Assist level: Touching or steadying assistance (Pt > 75%)   Wheelchair   Type: Manual Max wheelchair distance: 85ft  Assist Level: Supervision or verbal cues  Cognition Comprehension Comprehension assist level: Follows complex conversation/direction with no assist  Expression Expression assist level: Expresses complex ideas: With no assist  Social Interaction Social Interaction assist level: Interacts appropriately with others - No medications needed.  Problem Solving Problem solving assist level: Solves complex problems: Recognizes & self-corrects  Memory Memory assist level: Recognizes or recalls 90% of the  time/requires cueing < 10% of the time   Medical Problem List and Plan: 1. Weakness, decreased endurance secondary to debility from multiple medical issues  -continue CIR-  -continue to work through [facility] if she wants to achieve goals 2. DVT Prophylaxis/Anticoagulation: Pharmaceutical: Lovenox  -remains indicated  -dopplers negative 3. Pain Management: continue Fentanyl patch 100 mcg with oxycodone prn for pain  -states that pain is predominantly abdominal   -having some pain in legs with numbness, likely peripheral neuropathy with stocking glove sensory loss  -will try a hinged left knee brace to give extra support when weight bearing. 4. Mood: LCSW to follow for evaluation and support.   -continued scheduled low dose xanax to assist with anxiety  5. Neuropsych: This patient is capable of making decisions on her own behalf. 6. Skin/Wound Care: gradual improvement/ drainage much less .   -wound dressings per surgery.    -ostomy competent 7. Fluids/Electrolytes/Nutrition: Monitor I/O.   Renal status stable. Add supplements to help manage low protein stores. 8. ABLA: H/H stable at 7.6-8.0 range.---7.6 most recently - iron deficiency anemia  -B12 normal.   -Fe++ supp    9. COPD/OSA in setting of morbid obesity:  Continue brovana bid and Pulmicort bid.   - decannulated---doing well. Wound almost closed 10. Hypothyroid: On supplement.  11. Morbid Obesity Body mass index is 50.2 kg/(m^2). Diet and exercise education. Pt was highly sedentary PTA 12. Tobacco abuse: Continue counseling 13. Bilateral knee pain left knee brace as above 14. Hypothyroidism Continue medications  -TSH elevated by free T4 normal/high--no changes 15. Ear  wax---debrox completed with good results    LOS (Days) 12 A FACE TO FACE EVALUATION WAS PERFORMED  Amy Gothard T 05/14/2016 8:49 AM

## 2016-05-14 NOTE — Plan of Care (Signed)
Problem: RH PAIN MANAGEMENT Goal: RH STG PAIN MANAGED AT OR BELOW PT'S PAIN GOAL <3 on a 0-10 pain scale  Outcome: Not Progressing Patient frequently complain of pain

## 2016-05-14 NOTE — Progress Notes (Signed)
Pt has home unit and our unit has been removed.  Rt will monitor.

## 2016-05-14 NOTE — Progress Notes (Addendum)
Occupational Therapy Session Note  Patient Details  Name: Christina Castaneda MRN: [id number] Date of Birth: 22-Mar-1956  Today's Date: 05/14/2016 OT Individual Time: 1100-1200 and 1410-1430 OT Individual Time Calculation (min): 60 min and 20 min   Short Term Goals: Week 2:  OT Short Term Goal 1 (Week 2): Pt will complete functional transfers with supervision consistently OT Short Term Goal 2 (Week 2): Pt will tolerate full OT bathing/ dressing with only 1 rest break in order to increase functional activity tolerance OT Short Term Goal 3 (Week 2): Pt will complete toileting task with supervision OT Short Term Goal 4 (Week 2): Pt will complete one grooming task in standing in order to increase activity tolerance.   Skilled Therapeutic Interventions/Progress Updates:    Session One: Pt seen for OT ADL bathing/dressing session. Pt sitting up in recliner upon arrival, agreeable to tx session. She voiced increased fatigue this session, however, with encouragement willing to attempt. Mod A and VCs required for sit > stand from recliner. She ambulated ~77ft to w/c with min A. She completed bathing/dressing from w/c level with increased time. She stood with mod A and able to complete buttock hygiene with steadying assist. She required significantly increased time and rest breaks throughout ADL session. Pt positioned in front of recliner. Pt able to lift and place L LE up onto chair to access L foot to wash and don sock. She then demonstrated ability to cross ankle over knee to reach. Required assist to lift R LE up onto chair to complete tasks.  Pt left sitting in recliner at end of session, all needs in reach.    Session Two: Pt seen for OT session focusing on functional mobility and UE strengthening. Pt finishing PT session upon arrival voicing increased fatigue from PT session. She completed sit <> stand from recliner with mod A. She voiced increased dizziness upon standing, instructed to visually  stabilize to stay calm and VCs for deep breathing techniques. She tolerated 43 seconds of static standing with B UE support.  She ambulated ~7 ft to EOB. She sat EOB and completed UE strengthening exercises using #2 dowel rod. Completed x2 sets of 10 reps of overhead press, bicep curls, chest press, and straight arm lifts. Rest breaks required throughout and verbal and demonstrational cues for proper form and technique.  Pt returned to supine at end of session, able to position self in bed using hospital bed functions. Pt left in supine, all needs in reach.     Therapy Documentation Precautions:  Precautions Precautions: Fall, Other (comment) Precaution Comments: Trach capped as of 5/18 on room air Required Braces or Orthoses: Other Brace/Splint Other Brace/Splint: abdominal binder loosely over abdominal wound and ostomy site Restrictions Weight Bearing Restrictions: No Pain:   4/10 in abdomen. RN made aware and medication administered.  ADL: ADL ADL Comments: refer to functional navigator  See Function Navigator for Current Functional Status.   Therapy/Group: Individual Therapy  Lewis, Nanea Jared C 05/14/2016, 7:08 AM

## 2016-05-14 NOTE — Progress Notes (Signed)
Orthopedic Tech Progress Note Patient Details:  Christina Castaneda 1956/06/24 [id number]  Patient ID: [name], female   DOB: [birth year], 60 y.o.   MRN: [id number] Called in advanced brace order; spoke with Cletis [place], [place] Vanden[place]uvel 05/14/2016, 11:32 AM

## 2016-05-15 ENCOUNTER — Inpatient Hospital Stay (HOSPITAL_COMMUNITY): Payer: BLUE CROSS/BLUE SHIELD | Admitting: Occupational Therapy

## 2016-05-15 ENCOUNTER — Inpatient Hospital Stay (HOSPITAL_COMMUNITY): Payer: BLUE CROSS/BLUE SHIELD | Admitting: Physical Therapy

## 2016-05-15 ENCOUNTER — Inpatient Hospital Stay (HOSPITAL_COMMUNITY): Payer: BLUE CROSS/BLUE SHIELD

## 2016-05-15 NOTE — Consult Note (Signed)
WOC ostomy follow up Stoma type/location: LLQ, end colostomy Stomal assessment/size: oval shaped, new pattern left in the patient's room today Peristomal assessment: intact  Treatment options for stomal/peristomal skin: using 2" barrier ring to aid in seal Output thick, pasty, brown stool Ostomy pouching: 1pc.used with filter today so patient can see difference with filter, and with [place] opening.  Education provided:  Demonstrated pouch change with patient and husband again today.  Answered questions about shower and lifestyle with ostomy Enrolled patient in [place] Start Discharge program: Yes  WOC wound follow up Wound type: surgical  Wound bed: some yellow in the base centrally, one of the retention rods is hanging loose. Base of the wound has some early granulation tissue. The skin necrosis at the retention rods is much improved. One site just distal to the stoma is cleaning up, less yellow. Takedown site is clean.  Drainage (amount, consistency, odor) drainage is slowed, serosanguinous  Periwound: intact  Dressing procedure/placement/frequency: Continue saline moist gauze dressings TID per CCS orders.  I will communicate with CCS today about status of wound.  WOC will follow along with you for continued support with ostomy teaching and care [facility] RN,CWOCN [id number]

## 2016-05-15 NOTE — Progress Notes (Signed)
RT has set up patient home unit CPAP. Patient states she doesn't need any assistance putting on mask. No O2 bleed in needed.

## 2016-05-15 NOTE — Progress Notes (Signed)
Occupational Therapy Session Note  Patient Details  Name: Christina Castaneda MRN: [id number] Date of Birth: March 03, 1956  Today's Date: 05/15/2016 OT Individual Time: 1100-1200 OT Individual Time Calculation (min): 60 min    Short Term Goals: Week 2:  OT Short Term Goal 1 (Week 2): Pt will complete functional transfers with supervision consistently OT Short Term Goal 2 (Week 2): Pt will tolerate full OT bathing/ dressing with only 1 rest break in order to increase functional activity tolerance OT Short Term Goal 3 (Week 2): Pt will complete toileting task with supervision OT Short Term Goal 4 (Week 2): Pt will complete one grooming task in standing in order to increase activity tolerance.   Skilled Therapeutic Interventions/Progress Updates:    Pt seen for OT ADL bathing/dressing session. Pt sitting in recliner upon arrival with husband present. She required increased time and encouragement for participation in therapy session as pt stated she was very fatigued. Transferred pt to w/c for increased alertness. Pt's husband assisted with sit <> stand, though is not required. She bathed from w/c level at sink, standing at sink with mod A to complete buttock hygiene with steadying assist.  Throughout session required encouragement to participate and educated regarding benefits of OOB as pt requesting to return to supine during middle of session.  Assisted pt with donning new L knee brace for support. Pt voiced increased comfort and security with knee brace donned. She ambulated ~30ft with RW, however, became very anxious and VCs provided to stay calm and w/c needed to be brought behind for quick seated rest break.  She completed stand pivot transfer to [facility] and completed sit > stand from [facility] with Mod A. Pt reported increased burning with urination. RN made aware.  She returned to recliner at end of session, left sitting in recliner with all needs in reach.   Therapy Documentation Precautions:   Precautions Precautions: Fall, Other (comment) Precaution Comments: Trach capped as of 5/18 on room air Required Braces or Orthoses: Other Brace/Splint Other Brace/Splint: abdominal binder loosely over abdominal wound and ostomy site Restrictions Weight Bearing Restrictions: No Pain: Pain Assessment Pain Assessment: 0-10 Pain Score: 5  Pain Type: Surgical pain Pain Location: Abdomen Pain Orientation: Mid;Lower Pain Descriptors / Indicators: Aching Pain Frequency: Constant Pain Onset: On-going Patients Stated Pain Goal: 3 Pain Intervention(s): Repositioned ADL: ADL ADL Comments: refer to functional navigator  See Function Navigator for Current Functional Status.   Therapy/Group: Individual Therapy  Lewis, Ector Laurel C 05/15/2016, 7:14 AM

## 2016-05-15 NOTE — Progress Notes (Signed)
Physical Therapy Session Note  Patient Details  Name: Christina Castaneda MRN: [id number] Date of Birth: 1956/05/10  Today's Date: 05/15/2016 PT Individual Time: 0930-1000 PT Individual Time Calculation (min): 30 min   Short Term Goals: Week 2:  PT Short Term Goal 1 (Week 2): Pt will be able to progress sit <> stands to consistent min assist with walker PT Short Term Goal 2 (Week 2): Pt will be able to gait x 20' with min assist PT Short Term Goal 3 (Week 2): Pt will be able to initiate stair negotiation training  Skilled Therapeutic Interventions/Progress Updates:    Engaged in bed mobility to get to EOB with cues for gaze stabilization with supervision. Several attempts for sit to stand from EOB with mod to max assist and then mod assist to get into standing on 4th attempt. Short distance gait with RW to recliner. Instructed in habituation exercises for vestibular treatment per recommendations from PT who performed vestibular evaluation - plan for PM session to complete test to assess vestibular vs. cervical symptoms.   Habituation exercises: 30 seconds (head "no") 3' target seated - Rep 1 = minimal queasiness; Rep 2 = moderate nausea 30 seconds (head "yes") 3' target seated - Rep 1 = 5/10 dizziness; Rep 2 = 6/10 dizziness and mild nausea  Therapy Documentation Precautions:  Precautions Precautions: Fall, Other (comment) Precaution Comments: Trach capped as of 5/18 on room air Required Braces or Orthoses: Other Brace/Splint Other Brace/Splint: abdominal binder loosely over abdominal wound and ostomy site Restrictions Weight Bearing Restrictions: No    See Function Navigator for Current Functional Status.   Therapy/Group: Individual Therapy  Canary Brim [name], PT, DPT  05/15/2016, 10:57 AM

## 2016-05-15 NOTE — Progress Notes (Signed)
Physical Therapy Session Note  Patient Details  Name: Christina Castaneda MRN: [id number] Date of Birth: [birth year]  Today's Date: 05/15/2016 PT Individual Time: 0900-0933 PT Individual Time Calculation (min): 33 min   Short Term Goals: Week 2:  PT Short Term Goal 1 (Week 2): Pt will be able to progress sit <> stands to consistent min assist with walker PT Short Term Goal 2 (Week 2): Pt will be able to gait x 20' with min assist PT Short Term Goal 3 (Week 2): Pt will be able to initiate stair negotiation training  Skilled Therapeutic Interventions/Progress Updates:   Pt received in bed with husband present.  Reviewed findings from yesterday with husband and discussed further testing for today.  Performed roll test to R and L with pt asymptomatic to R but reporting nausea and vertigo with head turn to L but no true nystagmus noted; L eye demonstrated more of a twitching/tremoring.  Also performed R and L modified Hallpike-Dix with use of bed in reverse trendelenburg with pt reporting some nausea but no vertigo and no nystagmus.  Will have primary PT perform neck differentiation test this pm to rule out cervicogenic dizziness.  Demonstrated to pt and primary PT use of x 1 viewing for gaze stabilization and habituation to head movement in pitch and yaw planes.  Depending on findings of neck differentiation testing pt may benefit from treatment of cervical spine.  Will continue to address and treat as pt tolerates.  Therapy Documentation Precautions:  Precautions Precautions: Fall, Other (comment) Precaution Comments: Trach capped as of 5/18 on room air Required Braces or Orthoses: Other Brace/Splint Other Brace/Splint: abdominal binder loosely over abdominal wound and ostomy site Restrictions Weight Bearing Restrictions: No Pain: Pain Assessment Pain Assessment: 0-10 Pain Score: 7  Pain Type: Surgical pain Pain Location: Abdomen Pain Orientation: Mid;Lower Pain Descriptors / Indicators:  Aching Pain Frequency: Intermittent Pain Onset: Progressive Patients Stated Pain Goal: 4 Pain Intervention(s): Medication (See eMAR)  See Function Navigator for Current Functional Status.   Therapy/Group: Individual Therapy  Raylene Everts [facility] 05/15/2016, 11:59 AM

## 2016-05-15 NOTE — Progress Notes (Signed)
Physical Therapy Session Note  Patient Details  Name: LOVINA PLETCHER MRN: [id number] Date of Birth: March 17, 1956  Today's Date: 05/15/2016 PT Individual Time: 1330-1355 PT Individual Time Calculation (min): 25 min   Short Term Goals: Week 2:  PT Short Term Goal 1 (Week 2): Pt will be able to progress sit <> stands to consistent min assist with walker PT Short Term Goal 2 (Week 2): Pt will be able to gait x 20' with min assist PT Short Term Goal 3 (Week 2): Pt will be able to initiate stair negotiation training  Skilled Therapeutic Interventions/Progress Updates:   Session focused on w/c mobility training for functional strengthening and endurance and completing vestibular assessment in w/c to determine vestibular vs cervical response. Completed 5 head turns seated in chair with minimal c/o nausea. Completed 5 body rotation with still head with 6/10 dizziness and nausea. Completed 5 blocked head/body turns with minimal c/o nausea. Will discuss findings with vestibular PT. Handout given to patient for habituation exercises. End of session set up in w/c with all needs in reach.   Therapy Documentation Precautions:  Precautions Precautions: Fall, Other (comment) Precaution Comments: room air Required Braces or Orthoses: Other Brace/Splint Other Brace/Splint: abdominal binder loosely over abdominal wound and ostomy site Restrictions Weight Bearing Restrictions: No  Pain: Premedicated for pain.   See Function Navigator for Current Functional Status.   Therapy/Group: Individual Therapy  Canary Brim [name], PT, DPT  05/15/2016, 2:25 PM

## 2016-05-15 NOTE — Progress Notes (Signed)
Occupational Therapy Session Note  Patient Details  Name: Christina Castaneda MRN: [id number] Date of Birth: 02/06/1956  Today's Date: 05/15/2016 OT Individual Time: [id number] OT Individual Time Calculation (min): 43 min    Short Term Goals: Week 2:  OT Short Term Goal 1 (Week 2): Pt will complete functional transfers with supervision consistently OT Short Term Goal 2 (Week 2): Pt will tolerate full OT bathing/ dressing with only 1 rest break in order to increase functional activity tolerance OT Short Term Goal 3 (Week 2): Pt will complete toileting task with supervision OT Short Term Goal 4 (Week 2): Pt will complete one grooming task in standing in order to increase activity tolerance.   Skilled Therapeutic Interventions/Progress Updates:  Upon entering the room, pt with no c/o pain but with c/o fatigue this session. Pt also reporting feeling nauseated during this session. Pt declined standing tasks. Pt standing and transferring from wheelchair >bed with mod A for sit <>stand and steady assist to ambulate short distance to bed. Pt required assist to lift B LEs into bed. Pt performed 2 sets of 10 bicep curls, straight arm raises, chest press, forward rows, and backward rows with use of 2 lb dowel rod and rest breaks secondary to fatigue. Pt remained in bed at end of session with call bell and all needed items within reach upon exiting the room.   Therapy Documentation Precautions:  Precautions Precautions: Fall, Other (comment) Precaution Comments: room air Required Braces or Orthoses: Other Brace/Splint Other Brace/Splint: abdominal binder loosely over abdominal wound and ostomy site Restrictions Weight Bearing Restrictions: No Vital Signs: Therapy Vitals Temp: 97.6 F (36.4 C) Temp Source: Oral Pulse Rate: 80 Resp: 18 BP: 119/71 mmHg Patient Position (if appropriate): Sitting Oxygen Therapy SpO2: 96 % O2 Device: Not Delivered ADL: ADL ADL Comments: refer to functional  navigator  See Function Navigator for Current Functional Status.   Therapy/Group: Individual Therapy  Phineas Semen 05/15/2016, 4:15 PM

## 2016-05-15 NOTE — Progress Notes (Signed)
Pt was unavailable at time of am scheduled neb tx. Neb was returned to pyxis, but am not able to re-pull med to admin at this time. Will give PRN Albuterol if requested. RT will continue to monitor.

## 2016-05-15 NOTE — Progress Notes (Signed)
[facility] PHYSICAL MEDICINE & REHABILITATION     PROGRESS NOTE    Subjective/Complaints: No problems last night. Received knee brace yesterday evening. Instructed on how to don/doff.  ROS: Pt denies fever, rash/itching, headache, blurred or double vision, nausea, vomiting,   diarrhea, chest pain, shortness of breath, palpitations, dysuria, dizziness, neck or back pain, bleeding, or depression    Objective: Vital Signs: Blood pressure 142/62, pulse 59, temperature 97.9 F (36.6 C), temperature source Oral, resp. rate 18, height 5\' 7"  (1.702 m), weight 130.636 kg (288 lb), last menstrual period 11/18/2012, SpO2 98 %. No results found. No results for input(s): WBC, HGB, HCT, PLT in the last 72 hours. No results for input(s): NA, K, CL, GLUCOSE, BUN, CREATININE, CALCIUM in the last 72 hours.  Invalid input(s): CO CBG (last 3)   Recent Labs  05/14/16 0645  GLUCAP 86    Wt Readings from Last 3 Encounters:  05/15/16 130.636 kg (288 lb)  05/02/16 141 kg (310 lb 13.6 oz)  01/07/14 138.347 kg (305 lb)    Physical Exam:  Constitutional: She is oriented to person, place, and time. She appears well-developed and well-nourished.  Morbidly obese female  HENT: ear wax Head: Normocephalic and atraumatic.  Eyes: Conjunctivae and EOM are normal. Pupils are equal, round, and reactive to light.  Neck:  Trach site closed, healing wound.   Cardiovascular: Normal rate and regular rhythm.  No murmur heard. Respiratory: Effort normal. No stridor. She has decreased breath sounds in the right lower field and the left lower field. She has wheezes. She has rhonchi.  GI: Soft. Bowel sounds are normal. She exhibits no distension. There is tenderness.  Ostomy with  formed stool. area dry surrounding Musculoskeletal: She exhibits edema. Crepitus with left knee ROM.  Valgus deformity Neurological: She is alert and oriented to person, place, and time.  Motor:  Bilateral upper extremities 4+/5  proximal to distal Bilateral lower extremities hip flexion, knee extension 4-/5, ankle dorsi/plantarflexion 4+/5 Sensation diminished to light touch bilateral feet  Skin: Skin is warm and dry.  Midline abdominal incision with retention sutures/granulation tissue around 50%--remainder fibronecrotic. Lower wound with 100% granulation. Drainage much improved. Area tender to palpation. Surrounding skin less macerated    Psychiatric: Her speech is normal. Her affect is appropriate at the moment     Assessment/Plan: 1. Debility secondary to weakness from multiple medical issues/respiratory failure which require 3+ hours per day of interdisciplinary therapy in a comprehensive inpatient rehab setting. Physiatrist is providing close team supervision and 24 hour management of active medical problems listed below. Physiatrist and rehab team continue to assess barriers to discharge/monitor patient progress toward functional and medical goals.  Function:  Bathing Bathing position   Position: Wheelchair/chair at sink  Bathing parts Body parts bathed by patient: Right arm, Left arm, Chest, Front perineal area, Right upper leg, Left upper leg, Right lower leg, Left lower leg, Abdomen Body parts bathed by helper: Back  Bathing assist Assist Level: Touching or steadying assistance(Pt > 75%)      Upper Body Dressing/Undressing Upper body dressing   What is the patient wearing?: Button up shirt     Pull over shirt/dress - Perfomed by patient: Thread/unthread right sleeve, Thread/unthread left sleeve, Put head through opening Pull over shirt/dress - Perfomed by helper: Pull shirt over trunk Button up shirt - Perfomed by patient: Thread/unthread right sleeve, Thread/unthread left sleeve, Pull shirt around back, Button/unbutton shirt Button up shirt - Perfomed by helper: Button/unbutton shirt  Upper body assist Assist Level: Set up   Set up : To obtain clothing/put away  Lower Body  Dressing/Undressing Lower body dressing   What is the patient wearing?: Non-skid slipper socks     Pants- Performed by patient: Thread/unthread right pants leg, Thread/unthread left pants leg Pants- Performed by helper: Pull pants up/down Non-skid slipper socks- Performed by patient: Don/doff right sock, Don/doff left sock Non-skid slipper socks- Performed by helper: Don/doff right sock, Don/doff left sock                  Lower body assist Assist for lower body dressing: Touching or steadying assistance (Pt > 75%)      Toileting Toileting   Toileting steps completed by patient: Performs perineal hygiene Toileting steps completed by helper: Adjust clothing prior to toileting, Performs perineal hygiene, Adjust clothing after toileting Toileting Assistive Devices: Other (comment) (used bed pan due to urgency and pain)  Toileting assist Assist level: More than reasonable time   Transfers Chair/bed transfer   Chair/bed transfer method: Stand pivot, Ambulatory Chair/bed transfer assist level: Touching or steadying assistance (Pt > 75%) Chair/bed transfer assistive device: Walker, Armrests Mechanical lift: Ecologist Ambulation activity did not occur: Safety/medical concerns   Max distance: 10' Assist level: Touching or steadying assistance (Pt > 75%)   Wheelchair   Type: Manual Max wheelchair distance: 10' Assist Level: Supervision or verbal cues  Cognition Comprehension Comprehension assist level: Follows complex conversation/direction with no assist  Expression Expression assist level: Expresses complex ideas: With no assist  Social Interaction Social Interaction assist level: Interacts appropriately with others - No medications needed.  Problem Solving Problem solving assist level: Solves complex problems: Recognizes & self-corrects  Memory Memory assist level: Recognizes or recalls 90% of the time/requires cueing < 10% of the time   Medical Problem  List and Plan: 1. Weakness, decreased endurance secondary to debility from multiple medical issues  -continue CIR-  -team conf today 2. DVT Prophylaxis/Anticoagulation: Pharmaceutical: Lovenox  -remains indicated  -dopplers negative 3. Pain Management: continue Fentanyl patch 100 mcg with oxycodone prn for pain  -states that pain is predominantly abdominal   -having some pain in legs with numbness, likely peripheral neuropathy with stocking glove sensory loss  -pt received a hinged left knee brace- hopefully will provide additional support and confidence while up standing. 4. Mood: LCSW to follow for evaluation and support.   -continued scheduled low dose xanax to assist with anxiety  5. Neuropsych: This patient is capable of making decisions on her own behalf. 6. Skin/Wound Care: gradual improvement/ drainage much less .   -wound dressings per surgery.    -ostomy competent 7. Fluids/Electrolytes/Nutrition: Monitor I/O.   Renal status stable. Add supplements to help manage low protein stores. 8. ABLA: H/H stable at 7.6-8.0 range.---7.6 most recently - iron deficiency anemia  -B12 normal.   -Fe++ supp    9. COPD/OSA in setting of morbid obesity:  Continue brovana bid and Pulmicort bid.   - decannulated---doing well. Wound  closed 10. Hypothyroid: On supplement.  11. Morbid Obesity Body mass index is 50.2 kg/(m^2). Diet and exercise education. Pt was highly sedentary PTA 12. Tobacco abuse: Continue counseling 13. Bilateral knee pain left knee brace as above 14. Hypothyroidism Continue medications  -TSH elevated by free T4 normal/high--no changes 15. Ear wax---debrox completed with good results    LOS (Days) 13 A FACE TO FACE EVALUATION WAS PERFORMED  Christina Castaneda T 05/15/2016 8:51 AM

## 2016-05-15 NOTE — Progress Notes (Signed)
At this time patient is not participating in changing or emptying ostomy bag. Patient watches RN do these procedures but gives no interest in learning or participating.

## 2016-05-15 NOTE — Patient Care Conference (Signed)
Inpatient RehabilitationTeam Conference and Plan of Care Update Date: 05/15/2016   Time: 2:00 PM    Patient Name: Christina Castaneda      Medical Record Number: [id number]  Date of Birth: 1956/12/18 Sex: Female         Room/Bed: 4W02C/4W02C-01 Payor Info: Payor: Welcome / Plan: [employer] OTHER / Product Type: *No Product type* /    Admitting Diagnosis: Debility  Admit Date/Time:  05/02/2016  7:06 PM Admission Comments: No comment available   Primary Diagnosis:  Debility Principal Problem: Debility  Patient Active Problem List   Diagnosis Date Noted  . Debility 05/02/2016  . Edema   . Generalized weakness   . Chronic pain syndrome   . Hypoalbuminemia due to protein-calorie malnutrition ([place])   . OSA (obstructive sleep apnea)   . Morbid obesity due to excess calories ([place])   . S/P colostomy ([place])   . Tracheostomy status ([place])   . Tracheostomy care ([place])   . Chronic obstructive pulmonary disease ([place])   . Tobacco abuse   . Essential hypertension   . Bilateral knee pain   . Dysphagia   . Acute blood loss anemia   . Hyponatremia   . Thyroid activity decreased   . Acute respiratory failure ([place])   . Pressure ulcer 04/01/2016  . 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 Hypothyroid 03/29/2016  . Morbid obesity ([place]) 03/29/2016  . Open wound of abdomen 03/29/2016  . Infection of colostomy stoma ([place]) 03/28/2016  . 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 Obesity 11/12/2012    Expected Discharge Date: Expected Discharge Date: 05/25/16  Team Members Present: Physician leading conference: Dr. Alger Simons Social Worker Present: Lennart Pall, LCSW Nurse Present: Heather Roberts, RN PT Present: Canary Brim, PT OT Present: Napoleon Form, OT PPS Coordinator present : Daiva Nakayama, RN, CRRN     Current Status/Progress Goal Weekly Team Focus  Medical   wound with slow improvement. left knee brace for  support/pain. trach out---doing well  increased activity tolerance  wound care, knee pain, anxiety   Bowel/Bladder   colostomy with soft loose/formed stools  LBM 05/15/16 Continent of urine  cont with soft stools  cont. to monitor q shift   Swallow/Nutrition/ Hydration             ADL's   Mod sit > stand. Steadying assist bathing. Min LB dressing  Supervision- mod I Overall  sit <> stand; activity tolerance; standing balance/ endurance; strengthening   Mobility   min to mod assist transfers with RW; 10' of gait with RW with min assist; initiated stair negotiation  supervision overall for transfers and short distance household gait; mod assist 2 stairs; min assist bed mobility and car transfer  endurance, strengthening, transfers, standing tolerance, gait, stairs, balance   Communication             Safety/Cognition/ Behavioral Observations  no unsafe behavior  Mod I  cont. monitor q shift   Pain   Requires Oxy 10mg  q4 hours as needed  pain less than or equal to 2  cont. to monitor q shift   Skin   incisions with retention sutures - moist to dry dressing change BID, colostomy, abd folds, under breasts interdry as needed, buttocks looks good  cont. to monitor q shift  cont. to monitor skin q shift    Rehab Goals Patient on target to meet rehab goals: No *See Care Plan and  progress notes for long and short-term goals.  Barriers to Discharge: see prior    Possible Resolutions to Barriers:  adaptive equipment, anxiety rx, orthotics    Discharge Planning/Teaching Needs:  Home with husband who does work days.  Need to determine what his plan is to cover 24/7 care.  Husband working on arranging someone who can check on her regulary  TBD   Team Discussion:  Added knee brace, anxiety continues.  Making slow progress;  No refusals past couple of days but still showing learned helplessness.  Vestibular tx ongoing.  Currently min - mod assist and hope to be on track for supervision/ min  assist. Need to confirm status of pt's competance with ostomy care.  SW to confirm plan for d/c support.  Revisions to Treatment Plan:  None   Continued Need for Acute Rehabilitation Level of Care: The patient requires daily medical management by a physician with specialized training in physical medicine and rehabilitation for the following conditions: Daily direction of a multidisciplinary physical rehabilitation program to ensure safe treatment while eliciting the highest outcome that is of practical value to the patient.: Yes Daily medical management of patient stability for increased activity during participation in an intensive rehabilitation regime.: Yes Daily analysis of laboratory values and/or radiology reports with any subsequent need for medication adjustment of medical intervention for : Post surgical problems;Neurological problems;Wound care problems  Hiren Peplinski 05/15/2016, 3:44 PM

## 2016-05-16 ENCOUNTER — Inpatient Hospital Stay (HOSPITAL_COMMUNITY): Payer: BLUE CROSS/BLUE SHIELD | Admitting: Physical Therapy

## 2016-05-16 ENCOUNTER — Inpatient Hospital Stay (HOSPITAL_COMMUNITY): Payer: BLUE CROSS/BLUE SHIELD | Admitting: Occupational Therapy

## 2016-05-16 DIAGNOSIS — F4323 Adjustment disorder with mixed anxiety and depressed mood: Secondary | ICD-10-CM | POA: Insufficient documentation

## 2016-05-16 NOTE — Progress Notes (Signed)
Physical Therapy Session Note  Patient Details  Name: Christina Castaneda MRN: [id number] Date of Birth: 08/15/1956  Today's Date: 05/16/2016 PT Individual Time: 1450-1605 PT Individual Time Calculation (min): 75 min   Short Term Goals: Week 2:  PT Short Term Goal 1 (Week 2): Pt will be able to progress sit <> stands to consistent min assist with walker PT Short Term Goal 2 (Week 2): Pt will be able to gait x 20' with min assist PT Short Term Goal 3 (Week 2): Pt will be able to initiate stair negotiation training  Skilled Therapeutic Interventions/Progress Updates:   Pt received in bed very fatigued and reporting some low BP this pm. Pt willing to continued with vestibular treatment with focus on cervicogenic dizziness today.  Discussed with pt findings of neck differentiation test from yesterday and implications.  Discussed further cervical testing today.  Obtained history from pt of previous neck injury and surgery before beginning.  Pt performed supine > sit with min A.  Seated EOB PT placed pt in position to test and clear VA; performed to L and R with no reports of symptoms.  Also educated pt on use of cervical rotation, shoulder flexion + inhalation to improve lower cervical, upper thoracic ROM.  Pt return demonstrated x 5 reps to each side with limited rotation to L noted.  Pt returned to supine with mod A.  Performed Cervical flexion-rotation test to assess upper cervical C1, C2 rotation ROM and performed muscle energy techniques to improve rotation to each direction.  Continued neck treatment with suboccipital release and neck distraction to further increase ROM.  Pt educated on importance of deep neck flexor muscle strength and pt educated on how to perform activation of deep neck flexors with tactile feedback to minimize activation of SCM or scalenes.  Provided pt with handout of neck HEP; will educate husband on HEP Friday and re-assess after the weekend.  Pt reporting feeling less tense in  neck but dizziness not re-tested today.  Pt left in bed with all items within reach.  Therapy Documentation Precautions:  Precautions Precautions: Fall, Other (comment) Precaution Comments: room air Required Braces or Orthoses: Other Brace/Splint Other Brace/Splint: abdominal binder loosely over abdominal wound and ostomy site Restrictions Weight Bearing Restrictions: No Vital Signs: Therapy Vitals Temp: 98.3 F (36.8 C) Temp Source: Oral Pulse Rate: 63 Resp: 18 BP: (!) 118/46 mmHg Patient Position (if appropriate): Lying Oxygen Therapy SpO2: 100 % O2 Device: Not Delivered Pain: Pain Assessment Pain Assessment: No/denies pain Pain Score: 3  Pain Location: Leg Pain Intervention(s): Repositioned  See Function Navigator for Current Functional Status.   Therapy/Group: Individual Therapy  Raylene Everts [facility] 05/16/2016, 4:24 PM

## 2016-05-16 NOTE — Progress Notes (Signed)
Physical Therapy Session Note  Patient Details  Name: MARYTHERESA RATTS MRN: [id number] Date of Birth: 1956/09/13  Today's Date: 05/16/2016 PT Individual Time: 1130-1155 PT Individual Time Calculation (min): 25 min   Short Term Goals: Week 2:  PT Short Term Goal 1 (Week 2): Pt will be able to progress sit <> stands to consistent min assist with walker PT Short Term Goal 2 (Week 2): Pt will be able to gait x 20' with min assist PT Short Term Goal 3 (Week 2): Pt will be able to initiate stair negotiation training  Skilled Therapeutic Interventions/Progress Updates:    Pt received seated in w/c, c/o incisional pain but does not rate, and agreeable to treatment with encouragement. L knee brace donned totalA. Gait x4 trials with varying distances (25', 5', 15', 20') with min guard and standard walker. Reports she is limited mostly by "visual stuff" and nausea, and occasionally L knee feels like it may buckle. Requires extended seated rest breaks between trials due to fatigue. Sit <>stand for each trial with minA faded to min guard with walker and armrests. Educated pt in gradual increases in distance to improve endurance and activity tolerance. Remained seated in w/c at completion of session, all needs within reach.   Therapy Documentation Precautions:  Precautions Precautions: Fall, Other (comment) Precaution Comments: room air Required Braces or Orthoses: Other Brace/Splint Other Brace/Splint: abdominal binder loosely over abdominal wound and ostomy site Restrictions Weight Bearing Restrictions: No   See Function Navigator for Current Functional Status.   Therapy/Group: Individual Therapy  Luberta Mutter 05/16/2016, 12:00 PM

## 2016-05-16 NOTE — Progress Notes (Signed)
[facility] PHYSICAL MEDICINE & REHABILITATION     PROGRESS NOTE    Subjective/Complaints: Feeling well. Appreciative of knee brace. Feels that it's helped. Belly sometime painful due to retention sutures in place.  ROS: Pt denies fever, rash/itching, headache, blurred or double vision, nausea, vomiting,   diarrhea, chest pain, shortness of breath, palpitations, dysuria, dizziness, neck or back pain, bleeding, or depression    Objective: Vital Signs: Blood pressure 145/63, pulse 60, temperature 97.5 F (36.4 C), temperature source Oral, resp. rate 17, height 5\' 7"  (1.702 m), weight 142.429 kg (314 lb), last menstrual period 11/18/2012, SpO2 92 %. No results found. No results for input(s): WBC, HGB, HCT, PLT in the last 72 hours. No results for input(s): NA, K, CL, GLUCOSE, BUN, CREATININE, CALCIUM in the last 72 hours.  Invalid input(s): CO CBG (last 3)   Recent Labs  05/14/16 0645  GLUCAP 86    Wt Readings from Last 3 Encounters:  05/16/16 142.429 kg (314 lb)  05/02/16 141 kg (310 lb 13.6 oz)  01/07/14 138.347 kg (305 lb)    Physical Exam:  Constitutional: She is oriented to person, place, and time. She appears well-developed and well-nourished.  Morbidly obese female  HENT: ear wax Head: Normocephalic and atraumatic.  Eyes: Conjunctivae and EOM are normal. Pupils are equal, round, and reactive to light.  Neck:  Trach site closed.   Cardiovascular: Normal rate and regular rhythm.  No murmur heard. Respiratory: Effort normal. No stridor. She has decreased breath sounds in the right lower field and the left lower field. She has wheezes. She has rhonchi.  GI: Soft. Bowel sounds are normal. She exhibits no distension. There is tenderness.  Ostomy with  Semi-formed stool. area dry surrounding Musculoskeletal: She exhibits edema. Crepitus with left knee ROM.  Valgus deformity Neurological: She is alert and oriented to person, place, and time.  Motor:  Bilateral upper  extremities 4+/5 proximal to distal Bilateral lower extremities hip flexion, knee extension 4-/5, ankle dorsi/plantarflexion 4+/5 Sensation diminished to light touch bilateral feet  Skin: Skin is warm and dry.  Midline abdominal incision with retention sutures/granulation tissue still around 50%--remainder fibronecrotic. Lower wound with 100% granulation. Drainage much improved. Area tender to palpation. Surrounding skin less macerated    Psychiatric: Her speech is normal. Her affect is appropriate at the moment     Assessment/Plan: 1. Debility secondary to weakness from multiple medical issues/respiratory failure which require 3+ hours per day of interdisciplinary therapy in a comprehensive inpatient rehab setting. Physiatrist is providing close team supervision and 24 hour management of active medical problems listed below. Physiatrist and rehab team continue to assess barriers to discharge/monitor patient progress toward functional and medical goals.  Function:  Bathing Bathing position   Position: Wheelchair/chair at sink  Bathing parts Body parts bathed by patient: Right arm, Left arm, Chest, Front perineal area, Right upper leg, Left upper leg, Abdomen, Buttocks Body parts bathed by helper: Back  Bathing assist Assist Level: Touching or steadying assistance(Pt > 75%)      Upper Body Dressing/Undressing Upper body dressing   What is the patient wearing?: Button up shirt     Pull over shirt/dress - Perfomed by patient: Thread/unthread right sleeve, Thread/unthread left sleeve, Put head through opening Pull over shirt/dress - Perfomed by helper: Pull shirt over trunk Button up shirt - Perfomed by patient: Thread/unthread right sleeve, Thread/unthread left sleeve, Button/unbutton shirt Button up shirt - Perfomed by helper: Pull shirt around back    Upper body assist  Assist Level: Set up   Set up : To obtain clothing/put away  Lower Body Dressing/Undressing Lower body dressing  Lower body dressing/undressing activity did not occur: Refused What is the patient wearing?: Non-skid slipper socks     Pants- Performed by patient: Thread/unthread right pants leg, Thread/unthread left pants leg Pants- Performed by helper: Pull pants up/down Non-skid slipper socks- Performed by patient: Don/doff right sock, Don/doff left sock Non-skid slipper socks- Performed by helper: Don/doff right sock, Don/doff left sock                  Lower body assist Assist for lower body dressing: Touching or steadying assistance (Pt > 75%)      Toileting Toileting   Toileting steps completed by patient: Adjust clothing prior to toileting, Performs perineal hygiene, Adjust clothing after toileting Toileting steps completed by helper: Adjust clothing prior to toileting, Performs perineal hygiene, Adjust clothing after toileting Toileting Assistive Devices: Other (comment) (used bed pan due to urgency and pain)  Toileting assist Assist level: Touching or steadying assistance (Pt.75%)   Transfers Chair/bed transfer   Chair/bed transfer method: Stand pivot Chair/bed transfer assist level: Moderate assist (Pt 50 - 74%/lift or lower) Chair/bed transfer assistive device: Walker, Armrests Mechanical lift: Ecologist Ambulation activity did not occur: Safety/medical concerns   Max distance: 5' Assist level: Touching or steadying assistance (Pt > 75%)   Wheelchair   Type: Manual Max wheelchair distance: 15' Assist Level: Supervision or verbal cues  Cognition Comprehension Comprehension assist level: Follows complex conversation/direction with no assist  Expression Expression assist level: Expresses complex ideas: With no assist  Social Interaction Social Interaction assist level: Interacts appropriately with others - No medications needed.  Problem Solving Problem solving assist level: Solves complex problems: Recognizes & self-corrects  Memory Memory assist level:  Recognizes or recalls 90% of the time/requires cueing < 10% of the time   Medical Problem List and Plan: 1. Weakness, decreased endurance secondary to debility from multiple medical issues  -continue CIR-  -team conf today 2. DVT Prophylaxis/Anticoagulation: Pharmaceutical: Lovenox  -remains indicated  -dopplers negative 3. Pain Management: continue Fentanyl patch 100 mcg with oxycodone prn for pain  -states that pain is predominantly abdominal   -likely peripheral neuropathy with stocking glove sensory loss  -pt received a hinged left knee brace- to help with knee stability/mobility/confidence. 4. Mood: LCSW to follow for evaluation and support.   -continued scheduled low dose xanax to assist with anxiety  5. Neuropsych: This patient is capable of making decisions on her own behalf. 6. Skin/Wound Care: gradual improvement/ drainage much less .   -wound dressings per surgery.    -ostomy competent  -discussed with patient that she needs to been performing ostomy care. She understands   -nursing to follow up 7. Fluids/Electrolytes/Nutrition: Monitor I/O.   Renal status stable. Add supplements to help manage low protein stores. 8. ABLA: H/H stable at 7.6-8.0 range.---7.6 most recently - iron deficiency anemia  -B12 normal.   -Fe++ supp    9. COPD/OSA in setting of morbid obesity:  Continue brovana bid and Pulmicort bid.   - decannulated---doing well. Wound  closed 10. Hypothyroid: On supplement.  11. Morbid Obesity Body mass index is 50.2 kg/(m^2). Diet and exercise education. Pt was highly sedentary PTA 12. Tobacco abuse: Continue counseling 13. Bilateral knee pain left knee brace as above 14. Hypothyroidism Continue medications  -TSH elevated by free T4 normal/high--no changes 15. Ear wax---debrox completed with good results  LOS (Days) 14 A FACE TO FACE EVALUATION WAS PERFORMED  Blane Worthington  T 05/16/2016 10:37 AM

## 2016-05-16 NOTE — Consult Note (Signed)
Initial Psychodiagnostic Examination - [facility]   Mrs. Christina Castaneda is a 60 year old woman, who was seen for an initial psychodiagnostic evaluation to evaluate her emotional functioning and rule out psychiatric conditions in the setting of debility owing to multiple medical issues.  According to staff members, she was exhibiting signs of possible anxiety, which have been adversely impacting therapy.  Therefore, a neuropsychological consult was requested.    During today's session, Mrs. Christina Castaneda spent significant time venting about frustrations regarding the length of her illness and how it is seemingly poorly understood by her physicians.  She stated that she feels as though she is adjusting, but lamented how much she has to adjust to physically, mentally, and emotionally.  She acknowledged bouts of depression, but stated that she is able to talk to her husband about low moods and then feels better.  She denied any history of depression and also denied current or past suicidal ideation.  She spent some time during today's session venting about pain, numbness and dizziness that she has been experiencing lately.  When she experienced abdominal pain during the current session, the neuropsychologist encouraged her to practice deep breathing to get through the worst of the pain, which she did; she said that she often uses deep breathing to help her cope.    One of Mrs. Christina Castaneda's biggest reported issues was adjusting to having so many people around her all of the time.  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  She is not used to people coming around her with lots of energy.  Therefore, she remarked that she will likely respond better to therapies if staff members enter her room calmly and make small talk for a minute or two before jumping into the goals for the session.  She also stated that if she is asleep  when someone enters the room, she will likely respond best if they rub her hand and talk gently to her in order to rouse her, rather than being overly bubbly right at first.    Another issue that was addressed during today's session was Mrs. Christina Castaneda's fear of falling (and of staff members being too small to properly catch her).  However, she was encouraged to remind herself of an experience she had while on the unit when she started falling and one of her therapists was able to steady her and get her into a chair without major trouble.  She felt as though it would be hard for her to get over her fears of falling, but was agreeable to trying to remind herself that staff members are trained to keep her safe, regardless of their size or her size.     Impressions and Recommendations:  Mrs. Christina Castaneda seems to be having some depressive and anxious mood secondary to adjustment to illness; there is no evidence of a more pervasive underlying psychiatric disorder.  Staff members are encouraged to enter her room gingerly and provide her with a minute or two to acclimate before they begin discussing the agenda for the session in order to optimize participation from Mrs. Christina Castaneda.  She should also be reminded to use deep breathing to help her cope with pain.  No medications to aid in mood enhancement are likely indicated at this time, though her physician will make the final determination regarding medication management.  Follow-up with the neuropsychologist could be requested should her treatment team feel that it  would be beneficial in informing care.    DIAGNOSIS:   Adjustment disorder with mixed anxiety and depressed mood  Christina Castaneda, Psy.D.  Clinical Neuropsychologist

## 2016-05-16 NOTE — Progress Notes (Signed)
Social Work Patient ID: Christina Castaneda, female   DOB: 1955-12-25, 60 y.o.   MRN: [id number]  Met with pt's spouse this morning and with pt this afternoon to review team conference.  Both aware that team still targeting 6/2 for d/c and with supervision - min assist goals.  Husband asks, "Are you sure she's going to be ready by then?"  He is fully aware of pt's slow progress and learned helplessness and admits that he has "never pushed her...guess I've enabled this to a certain degree."  Stressed to him that she has had some improvement this week in participation, however, it needs to continue to improve in order to meet goals.   Husband reports that he has folks who can "be with her but just to get things for her and be company to her but I don't think they could physically help her."  Discussed with both that, if pt does not reach a functional level that can be supported at home, then we would need to talk about option of SNF.  Both understand that this may be needed.  Pt tearfully states, "... But I want to go home."  She reports that she understands she needs to push herself harder this week and that she is ready to do so.  Will monitor.  Trishia Cuthrell, LCSW

## 2016-05-16 NOTE — Progress Notes (Signed)
RT set up patient home unit CPAP. Patient doesn't need any assistance in putting on mask. RT will continue to monitor as needed.

## 2016-05-16 NOTE — Progress Notes (Signed)
Occupational Therapy Session Note  Patient Details  Name: Christina Castaneda MRN: [id number] Date of Birth: 01-May-1956  Today's Date: 05/16/2016 OT Individual Time: 1025-1100 OT Individual Time Calculation (min): 35 min  OT missed time: 10 min (nursing care)   Short Term Goals: Week 2:  OT Short Term Goal 1 (Week 2): Pt will complete functional transfers with supervision consistently OT Short Term Goal 2 (Week 2): Pt will tolerate full OT bathing/ dressing with only 1 rest break in order to increase functional activity tolerance OT Short Term Goal 3 (Week 2): Pt will complete toileting task with supervision OT Short Term Goal 4 (Week 2): Pt will complete one grooming task in standing in order to increase activity tolerance.   Skilled Therapeutic Interventions/Progress Updates:    Pt seen for OT ADL bathing/dressing session. Pt sitting up in recliner upon arrival, voicing increased pain and discomfort from abdomen as pt felt "pinch" in surgical site during PT session. RN made aware and assured pt was safe to cont with therapy, missed 10 min of OT session due to RN care.  Pt completed bathing/dressing and grooming tasks seated in w/c at sink. She required set-up assist for bathing/dressing using LH sponge. Rest breaks required throughout due to decreased activity tolerance. Chair placed in front of pt for her to prop feet onto wash. She required mod A for sit <> stand in order to complete stand pivot to [facility]. She was left sitting on BSC at end of session, husband present. RN made aware of pt's position. Pt informed to call for staff assistance when done.  Pt cont to demonstrate self limiting behaviors throughout session. Cont to encourage active participation in therapy in order to meet OT goals.   Therapy Documentation Precautions:  Precautions Precautions: Fall, Other (comment) Precaution Comments: room air Required Braces or Orthoses: Other Brace/Splint Other Brace/Splint: abdominal binder  loosely over abdominal wound and ostomy site Restrictions Weight Bearing Restrictions: No Pain: Pain Assessment Pain Assessment: 0-10 Pain Score: 2  Pain Location: Leg Pain Intervention(s): Repositioned ADL: ADL ADL Comments: refer to functional navigator  See Function Navigator for Current Functional Status.   Therapy/Group: Individual Therapy  Lewis, Ingram Onnen C 05/16/2016, 6:54 AM

## 2016-05-16 NOTE — Progress Notes (Signed)
Physical Therapy Session Note  Patient Details  Name: Christina Castaneda MRN: [id number] Date of Birth: [birth year]  Today's Date: 05/16/2016 PT Individual Time: 0902-1001 PT Individual Time Calculation (min): 59 min   Short Term Goals: Week 2:  PT Short Term Goal 1 (Week 2): Pt will be able to progress sit <> stands to consistent min assist with walker PT Short Term Goal 2 (Week 2): Pt will be able to gait x 20' with min assist PT Short Term Goal 3 (Week 2): Pt will be able to initiate stair negotiation training  Skilled Therapeutic Interventions/Progress Updates:    Pt received in bed & agreeable to PT, noting 2/10 pain. Pt reported need to use restroom & completed supine>sit with bed flat & bed rails with supervision & stand pivot bed<>BSC with steady A; (+) void. Returned to bed & PT adjusted L knee brace. Pt with difficulty completing sit<>stand and required multimodal cuing for hand placement & anterior weight shift, pt able to complete transfer after multiple attempts & Min A + 2. Pt self propelled w/c x 50 ft with BUE for cardiovascular endurance training but required multiple rest breaks & significantly extra time.  In gym pt able to negotiate single 6 inch step with B rails x 3 trials with seated rest breaks in between. Pt ascended step leading with RLE & descended backwards leading with LLE. Attempted gait training but pt with significant difficulty transferring sit>stand due to poor sequence & technique as pt demonstrated poor anterior weight shift & would attempt to reach for RW before pushing up with BUE even with maximum cuing from PT. After attempting sit>stand pt lost balance back into w/c & reported significant pain in abdominal clamps. PT inspected area but did not observe any changes, pt returned to room in w/c & PT notified RN of pt's c/o pain. At end of session pt left in w/c with all needs within reach.   Therapy Documentation Precautions:  Precautions Precautions: Fall, Other  (comment) Precaution Comments: room air Required Braces or Orthoses: Other Brace/Splint Other Brace/Splint: abdominal binder loosely over abdominal wound and ostomy site Restrictions Weight Bearing Restrictions: No  Pain: Pain Assessment Pain Assessment: 0-10 Pain Score: 2  Pain Location: Leg Pain Orientation: Left Pain Intervention(s): Repositioned   See Function Navigator for Current Functional Status.   Therapy/Group: Individual Therapy  Waunita Schooner 05/16/2016, 7:55 AM

## 2016-05-16 NOTE — Plan of Care (Signed)
Problem: RH BOWEL ELIMINATION Goal: RH STG MANAGE BOWEL WITH ASSISTANCE STG Manage Bowel with mod Assistance.  Outcome: Not Progressing Patient not providing assistance with emptying colostomy bag. She observes Therapist, sports but does not offer assistance.

## 2016-05-17 ENCOUNTER — Inpatient Hospital Stay (HOSPITAL_COMMUNITY): Payer: BLUE CROSS/BLUE SHIELD | Admitting: Occupational Therapy

## 2016-05-17 ENCOUNTER — Inpatient Hospital Stay (HOSPITAL_COMMUNITY): Payer: BLUE CROSS/BLUE SHIELD | Admitting: Physical Therapy

## 2016-05-17 NOTE — Progress Notes (Signed)
Patient ID: Christina Castaneda, female   DOB: 21-Jan-1956, 60 y.o.   MRN: [id number]   2 retention sutures removed. 2 remain in place.  Would is granulating well where partially separated.  Continue with BID wet to dry dressing changes. Please call for further assistance.   Johndavid Geralds, ANP-BC

## 2016-05-17 NOTE — Progress Notes (Addendum)
[place] PHYSICAL MEDICINE & REHABILITATION     PROGRESS NOTE    Subjective/Complaints: Worked on stairs yesterday. W/c moved while she was transferring and she felt something pinch in her stomach. Feels back at baseline this morning  ROS: Pt denies fever, rash/itching, headache, blurred or double vision, nausea, vomiting,   diarrhea, chest pain, shortness of breath, palpitations, dysuria, dizziness, neck or back pain, bleeding, or depression    Objective: Vital Signs: Blood pressure 105/53, pulse 58, temperature 97.2 F (36.2 C), temperature source Oral, resp. rate 18, height 5\' 7"  (1.702 m), weight 142.429 kg (314 lb), last menstrual period 11/18/2012, SpO2 94 %. No results found. No results for input(s): WBC, HGB, HCT, PLT in the last 72 hours. No results for input(s): NA, K, CL, GLUCOSE, BUN, CREATININE, CALCIUM in the last 72 hours.  Invalid input(s): CO CBG (last 3)  No results for input(s): GLUCAP in the last 72 hours.  Wt Readings from Last 3 Encounters:  05/16/16 142.429 kg (314 lb)  05/02/16 141 kg (310 lb 13.6 oz)  01/07/14 138.347 kg (305 lb)    Physical Exam:  Constitutional: She is oriented to person, place, and time. She appears well-developed and well-nourished.  Morbidly obese female  HENT: ear wax Head: Normocephalic and atraumatic.  Eyes: Conjunctivae and EOM are normal. Pupils are equal, round, and reactive to light.  Neck:  Trach site closed.   Cardiovascular: Normal rate and regular rhythm.  No murmur heard. Respiratory: Effort normal. No stridor. She has decreased breath sounds in the right lower field and the left lower field. She has wheezes. She has rhonchi.  GI: Soft. Bowel sounds are normal. She exhibits no distension. There is tenderness.  Ostomy with  Semi-formed stool. area dry surrounding Musculoskeletal: She exhibits edema. Crepitus with left knee ROM.  Valgus deformity Neurological: She is alert and oriented to person, place, and  time.  Motor:  Bilateral upper extremities 4+/5 proximal to distal Bilateral lower extremities hip flexion, knee extension 4-/5, ankle dorsi/plantarflexion 4+/5 Sensation diminished to light touch bilateral feet  Skin: Skin is warm and dry.  Midline abdominal incision with retention sutures/granulation tissue still around 50%--remainder fibronecrotic. Lower wound with 100% granulation. Drainage much improved. Area tender to palpation. Surrounding skin less macerated    Psychiatric: Her speech is normal. Her affect is appropriate at the moment     Assessment/Plan: 1. Debility secondary to weakness from multiple medical issues/respiratory failure which require 3+ hours per day of interdisciplinary therapy in a comprehensive inpatient rehab setting. Physiatrist is providing close team supervision and 24 hour management of active medical problems listed below. Physiatrist and rehab team continue to assess barriers to discharge/monitor patient progress toward functional and medical goals.  Function:  Bathing Bathing position   Position: Wheelchair/chair at sink  Bathing parts Body parts bathed by patient: Right arm, Left arm, Chest, Front perineal area, Right upper leg, Left upper leg, Abdomen, Buttocks Body parts bathed by helper: Back  Bathing assist Assist Level: Touching or steadying assistance(Pt > 75%)      Upper Body Dressing/Undressing Upper body dressing   What is the patient wearing?: Button up shirt     Pull over shirt/dress - Perfomed by patient: Thread/unthread right sleeve, Thread/unthread left sleeve, Put head through opening Pull over shirt/dress - Perfomed by helper: Pull shirt over trunk Button up shirt - Perfomed by patient: Thread/unthread right sleeve, Thread/unthread left sleeve, Button/unbutton shirt Button up shirt - Perfomed by helper: Pull shirt around back  Upper body assist Assist Level: Set up   Set up : To obtain clothing/put away  Lower Body  Dressing/Undressing Lower body dressing Lower body dressing/undressing activity did not occur: Refused What is the patient wearing?: Non-skid slipper socks     Pants- Performed by patient: Thread/unthread right pants leg, Thread/unthread left pants leg Pants- Performed by helper: Pull pants up/down Non-skid slipper socks- Performed by patient: Don/doff right sock, Don/doff left sock Non-skid slipper socks- Performed by helper: Don/doff right sock, Don/doff left sock                  Lower body assist Assist for lower body dressing: Touching or steadying assistance (Pt > 75%)      Toileting Toileting   Toileting steps completed by patient: Adjust clothing prior to toileting, Performs perineal hygiene, Adjust clothing after toileting Toileting steps completed by helper: Adjust clothing prior to toileting, Performs perineal hygiene, Adjust clothing after toileting Toileting Assistive Devices: Other (comment)  Toileting assist Assist level: Touching or steadying assistance (Pt.75%)   Transfers Chair/bed transfer   Chair/bed transfer method: Stand pivot Chair/bed transfer assist level: Touching or steadying assistance (Pt > 75%) Chair/bed transfer assistive device: Walker, Armrests Mechanical lift: Stedy   Locomotion Ambulation Ambulation activity did not occur: Safety/medical concerns   Max distance: 25' Assist level: Touching or steadying assistance (Pt > 75%)   Wheelchair   Type: Manual Max wheelchair distance: 15' Assist Level: Supervision or verbal cues  Cognition Comprehension Comprehension assist level: Follows basic conversation/direction with no assist  Expression Expression assist level: Expresses complex ideas: With no assist  Social Interaction Social Interaction assist level: Interacts appropriately with others - No medications needed.  Problem Solving Problem solving assist level: Solves complex problems: Recognizes & self-corrects  Memory Memory assist  level: Recognizes or recalls 90% of the time/requires cueing < 10% of the time   Medical Problem List and Plan: 1. Weakness, decreased endurance secondary to debility from multiple medical issues  -continue CIR-  -continue to work through [place] 2. DVT Prophylaxis/Anticoagulation: Pharmaceutical: Lovenox  -remains indicated  -dopplers negative 3. Pain Management: continue Fentanyl patch 100 mcg with oxycodone prn for pain  -states that pain is predominantly abdominal   -likely peripheral neuropathy with stocking glove sensory loss  -pt received a hinged left knee brace- to help with knee stability/mobility/confidence. 4. Mood: LCSW to follow for evaluation and support.   -continued scheduled low dose xanax to assist with anxiety  5. Neuropsych: This patient is capable of making decisions on her own behalf. 6. Skin/Wound Care:  drainage much less. Still with large central area of granulation with fibronecrotic tissue.   -wound dressings per surgery.    -ostomy competent  -pt to be more involved with ostomy care   -nursing to follow up 7. Fluids/Electrolytes/Nutrition: Monitor I/O.   Renal status stable. Add supplements to help manage low protein stores. 8. ABLA: H/H stable at 7.6-8.0 range.---7.6 most recently - iron deficiency anemia  -B12 normal.   -Fe++ supp    9. COPD/OSA in setting of morbid obesity:  Continue brovana bid and Pulmicort bid.   - decannulated---doing well. Wound  closed 10. Hypothyroid: On supplement.  11. Morbid Obesity  Diet and exercise education. Pt was highly sedentary PTA 12. Tobacco abuse: Continue counseling 13. Bilateral knee pain left knee brace as above 14. Hypothyroidism Continue medications  -TSH elevated by free T4 normal/high--no changes 15. Ear wax---debrox completed with good results    LOS (Days) 15 A FACE  TO FACE EVALUATION WAS PERFORMED  Bronislaus Verdell T 05/17/2016 7:43  AM

## 2016-05-17 NOTE — Progress Notes (Signed)
Occupational Therapy Weekly Progress Note  Patient Details  Name: Christina Castaneda MRN: [id number] Date of Birth: 20-Mar-1956  Beginning of progress report period: May 10, 2016 End of progress report period: May 17, 2016  Today's Date: 05/17/2016 OT Individual Time: 1300-1400 OT Individual Time Calculation (min): 60 min    Patient has met 3 of 4 short term goals.  Pt cont to require rest breaks throughout ADL sessions. She is limited by abdominal discomfort and anxiety and requires rest breaks to calm down following exertion during bathing/dressing tasks.  Towards the end of this reporting period, pt appears to be more motivated and participatory in therapy sessions. She cont to require encouragement for tasks, however, is much improved since admission. Pt states often " I just want to be able to return home from here.".  She cont to make gains towards overall supervision level OT goals.   Patient continues to demonstrate the following deficits: acute pain and muscle weakness (generalized)  and therefore will continue to benefit from skilled OT intervention to enhance overall performance with BADL, iADL and Reduce care partner burden.  Patient progressing toward long term goals..  Continue plan of care.  OT Short Term Goals Week 2:  OT Short Term Goal 1 (Week 2): Pt will complete functional transfers with supervision consistently OT Short Term Goal 1 - Progress (Week 2): Met OT Short Term Goal 2 (Week 2): Pt will tolerate full OT bathing/ dressing with only 1 rest break in order to increase functional activity tolerance OT Short Term Goal 2 - Progress (Week 2): Partly met OT Short Term Goal 3 (Week 2): Pt will complete toileting task with supervision OT Short Term Goal 3 - Progress (Week 2): Met OT Short Term Goal 4 (Week 2): Pt will complete one grooming task in standing in order to increase activity tolerance.  OT Short Term Goal 4 - Progress (Week 2): Met Week 3:  OT Short Term Goal 1  (Week 3): STG=LTG due to LOS  Skilled Therapeutic Interventions/Progress Updates:    Pt seen for OT session focusing on functional ambulation, functional transfers, and standing balance/ endurance. Pt in recliner upon arrival, agreeable to tx session. She required increased time and encouragement, however, was able to stand from recliner with min A and ambulate ~55f to w/c placed outside her room.  In ADL apartment, pt ambulated throughout apartment to bathroom where she completed simulated tub/shower transfer using tub bench. VCs and demonstration provided for safe transfer technique and pt completed at overall CGA. She was able to manage B LEs over tub wall to fully get into tub/shower. She returned to w/c in same manner.  In therapy gym, [facility] with emphasis on standing tolerance and dynamic balance without UE support. Pt required to obtain horseshoe from chair placed beside her and place on overhead basketball rim and then return to starting position. She was able to tolerate ~1 minute of standing before requesting seated rest break. Completed x2 trials with seated rest break provided btwn trials. [place]  To close supervision for sit> [facility]  Pt returned to room at end of session. She ambulated from outside room doorway to recliner with close supervision.   Therapy Documentation Precautions:  Precautions Precautions: Fall, Other (comment) Precaution Comments: room air Required Braces or Orthoses: Other Brace/Splint Other Brace/Splint: abdominal binder loosely over abdominal wound and ostomy site Restrictions Weight Bearing Restrictions: No Pain: Pain Assessment Pain  Assessment: 0-10 Pain Score: 4  Pain Type: Surgical pain Pain Location: Abdomen Pain Descriptors / Indicators: Aching Pain Frequency: Constant Pain Intervention(s): Repositioned; increased activity ADL: ADL ADL Comments: refer to functional  navigator  See Function Navigator for Current Functional Status.   Therapy/Group: Individual Therapy  Lewis, Danni Shima C 05/17/2016, 7:17 AM

## 2016-05-17 NOTE — Progress Notes (Signed)
   05/17/16 2012  BiPAP/CPAP/SIPAP  BiPAP/CPAP/SIPAP Pt Type Adult (Home Unit, patient will place herself on the CPAP)  Mask Type Nasal pillows  BiPAP/CPAP /SiPAP Vitals  Bilateral Breath Sounds Clear

## 2016-05-17 NOTE — Progress Notes (Signed)
Physical Therapy Weekly Progress Note  Patient Details  Name: Christina Castaneda MRN: [id number] Date of Birth: 03-02-1956  Beginning of progress report period: May 10, 2016 End of progress report period: May 17, 2016  Today's Date: 05/17/2016 PT Individual Time: 5909-3112 PT Individual Time Calculation (min): 68 min   Patient has met 3 of 3 short term goals.  Pt has made slow but steady progress towards goals this week.   Patient continues to demonstrate the following deficits: endurance, LE strength, and power and therefore will continue to benefit from skilled PT intervention to enhance overall performance with activity tolerance, balance and postural control.  Patient progressing toward long term goals..  Continue plan of care.  PT Short Term Goals Week 2:  PT Short Term Goal 1 (Week 2): Pt will be able to progress sit <> stands to consistent min assist with walker PT Short Term Goal 1 - Progress (Week 2): Met PT Short Term Goal 2 (Week 2): Pt will be able to gait x 20' with min assist PT Short Term Goal 2 - Progress (Week 2): Met PT Short Term Goal 3 (Week 2): Pt will be able to initiate stair negotiation training PT Short Term Goal 3 - Progress (Week 2): Met Week 3:  PT Short Term Goal 1 (Week 3): =LTGs due to ELOS  Skilled Therapeutic Interventions/Progress Updates:    Pt received resting in recliner with no c/o pain and agreeable to therapy session.  Session focus on functional transfers, gait with walker, LE strength, and overall endurance.    Pt performs sit<>stand transfer from low recliner (mod assist), w/c x3 (min>supervision), and Nustep (min assist) with verbal cues for forward weight shift.    Pt propelled w/c from room x70' with many rest breaks 2/2 fatigue.  PT instructed pt in Nustep x15 minutes at level 3 for overall endurance and LE ROM/strengthening.  Pt reports decrease in knee pain following Nustep and cheerful throughout activity, maintaining 30-40 steps per  minute pace.    Gait training x27' with standard walker and supervision.  Pt requesting to toilet and returned to room in w/c total assist for urgency.  Pt agreeable to amb into bathroom with supervision and walker, positioned self on toilet and is agreeable to call for assistance when she is finished.  PT oriented pt to call string in bathroom.  NT also alerted to pt position.    Therapy Documentation Precautions:  Precautions Precautions: Fall, Other (comment) Precaution Comments: room air Required Braces or Orthoses: Other Brace/Splint Other Brace/Splint: abdominal binder loosely over abdominal wound and ostomy site Restrictions Weight Bearing Restrictions: No   See Function Navigator for Current Functional Status.  Therapy/Group: Individual Therapy  Earnest Conroy Penven-Crew 05/17/2016, 4:50 PM

## 2016-05-17 NOTE — Progress Notes (Signed)
Occupational Therapy Session Note  Patient Details  Name: Christina Castaneda MRN: [id number] Date of Birth: 1956-08-19  Today's Date: 05/17/2016 OT Individual Time: 0900-1000 OT Individual Time Calculation (min): 60 min    Skilled Therapeutic Interventions/Progress Updates:    1:1 self care retraining at sink level. Pt able to perform bed mobility with steadying A and min A to come into stand from EOB.  Pt able to ambulation to bathroom to use BSC over commode with steadying A at slow pace. Pt demonstrated ability to empty colostomy bag and peform peri hygiene after voiding in standing with supervision. Pt bathed with setup at sink. Pt able to thread underwear without use of AE with extra time. Pt still slightly fearful with sit to stands at different surfaces but able to perform with min A with extra time. Pt don socks with sock aide with setup. Pt husband came at end of session and discussed what AE and where could purchase to decr burden of care.  Ambulated from door to recliner with RW with close supervison with VC for proper hand placement. Pt demonstrated increased activity tolerance/ endurance today with activities with rest breaks provided as needed.   Therapy Documentation Precautions:  Precautions Precautions: Fall, Other (comment) Precaution Comments: room air Required Braces or Orthoses: Other Brace/Splint Other Brace/Splint: abdominal binder loosely over abdominal wound and ostomy site Restrictions Weight Bearing Restrictions: No Pain: Pain Assessment Pain Assessment: 0-10 Pain Score: 0-No pain Pain Type: Acute pain Pain Location: Abdomen Pain Orientation: Mid Pain Descriptors / Indicators: Aching Pain Frequency: Intermittent Pain Onset: With Activity Patients Stated Pain Goal: 0 Pain Intervention(s): Medication (See eMAR) ADL: ADL ADL Comments: refer to functional navigator  See Function Navigator for Current Functional Status.   Therapy/Group: Individual  Therapy  Willeen Cass [facility] 05/17/2016, 2:50 PM

## 2016-05-18 ENCOUNTER — Inpatient Hospital Stay (HOSPITAL_COMMUNITY): Payer: BLUE CROSS/BLUE SHIELD

## 2016-05-18 ENCOUNTER — Inpatient Hospital Stay (HOSPITAL_COMMUNITY): Payer: BLUE CROSS/BLUE SHIELD | Admitting: Occupational Therapy

## 2016-05-18 ENCOUNTER — Inpatient Hospital Stay (HOSPITAL_COMMUNITY): Payer: BLUE CROSS/BLUE SHIELD | Admitting: Physical Therapy

## 2016-05-18 LAB — CBC WITH DIFFERENTIAL/PLATELET
BASOS ABS: 0 10*3/uL (ref 0.0–0.1)
Basophils Relative: 0 %
Eosinophils Absolute: 0.3 10*3/uL (ref 0.0–0.7)
Eosinophils Relative: 3 %
HCT: 32.4 % — ABNORMAL LOW (ref 36.0–46.0)
HEMOGLOBIN: 9.4 g/dL — AB (ref 12.0–15.0)
LYMPHS ABS: 3.6 10*3/uL (ref 0.7–4.0)
LYMPHS PCT: 41 %
MCH: 26.4 pg (ref 26.0–34.0)
MCHC: 29 g/dL — ABNORMAL LOW (ref 30.0–36.0)
MCV: 91 fL (ref 78.0–100.0)
Monocytes Absolute: 0.5 10*3/uL (ref 0.1–1.0)
Monocytes Relative: 6 %
NEUTROS PCT: 50 %
Neutro Abs: 4.3 10*3/uL (ref 1.7–7.7)
Platelets: 398 10*3/uL (ref 150–400)
RBC: 3.56 MIL/uL — AB (ref 3.87–5.11)
RDW: 17.1 % — ABNORMAL HIGH (ref 11.5–15.5)
WBC: 8.7 10*3/uL (ref 4.0–10.5)

## 2016-05-18 LAB — BASIC METABOLIC PANEL
ANION GAP: 9 (ref 5–15)
BUN: 10 mg/dL (ref 6–20)
CHLORIDE: 98 mmol/L — AB (ref 101–111)
CO2: 25 mmol/L (ref 22–32)
Calcium: 9.1 mg/dL (ref 8.9–10.3)
Creatinine, Ser: 0.99 mg/dL (ref 0.44–1.00)
GFR calc Af Amer: >60 mL/min (ref >60)
GFR, EST NON AFRICAN AMERICAN: 60 mL/min — AB (ref >60)
Glucose, Bld: 88 mg/dL (ref 65–99)
POTASSIUM: 4.3 mmol/L (ref 3.5–5.1)
SODIUM: 132 mmol/L — AB (ref 135–145)

## 2016-05-18 NOTE — Progress Notes (Signed)
Physical Therapy Session Note  Patient Details  Name: Christina Castaneda MRN: [id number] Date of Birth: May 01, 1956  Today's Date: 05/18/2016 PT Individual Time: 1402-1502 PT Individual Time Calculation (min): 60 min   Short Term Goals: Week 3:  PT Short Term Goal 1 (Week 3): =LTGs due to ELOS  Skilled Therapeutic Interventions/Progress Updates:   Pt received sitting in w/c; pt reporting buttocks pain and numbness from prolonged sitting.  Pt agreeable to therapy in gym.  Pt reporting husband was present in the am but not this pm to learn HEP; will attempt to treat pt in am in order to teach husband how to assist with HEP.  Pt transported to gym in w/c total A while discussing pt's symptoms and questions about HEP.  Pt denies questions and reports that she is less sensitive to transfers now with less nausea.  Pt is tolerating more therapy over the past 3 days.    In gym pt performed sit <> stand from w/c with mod A to bring COG fully over BOS but able to perform pivot w/c <> Nustep with supervision and SW.  Pt performed general endurance training, UE and LE ROM and strengthening on Nustep at level 3 resistance x 16 minutes at 50 steps per minute.  At end pt's HR: 91, Sp02: 98%.  Pt returned to w/c to perform x 1 viewing.  Reviewed how to perform exercise and pt performed x 1 viewing in yaw and pitch planes x 30 seconds each in sitting with target at 74ft away; pt able to increase speed of pitch movement but had to decrease speed of yaw movement due to increased slip and corrective saccades.  Performed x 1 viewing in pitch and yaw planes in standing with bilat UE support and min A due to anxiety; pt only able to perform these movements x 10 seconds due to symptoms of dizziness.  Returned to w/c and to room where pt performed stand pivot to bed with min-mod A and sit > supine on flat bed with mod A.  Pt left with all items within reach.  Therapy Documentation Precautions:  Precautions Precautions: Fall,  Other (comment) Precaution Comments: room air Required Braces or Orthoses: Other Brace/Splint Other Brace/Splint: abdominal binder loosely over abdominal wound and ostomy site Restrictions Weight Bearing Restrictions: No Vital Signs: Therapy Vitals Temp: 98 F (36.7 C) Temp Source: Oral Pulse Rate: 68 Resp: 18 BP: (!) 128/51 mmHg Patient Position (if appropriate): Lying Oxygen Therapy SpO2: 100 % O2 Device: Not Delivered Pain:  Pain in buttocks from prolonged sitting-repositioned  See Function Navigator for Current Functional Status.   Therapy/Group: Individual Therapy  Raylene Everts [facility] 05/18/2016, 3:19 PM

## 2016-05-18 NOTE — Progress Notes (Signed)
Patient has home unit in room and places self on and off when ready.

## 2016-05-18 NOTE — Progress Notes (Signed)
[place] PHYSICAL MEDICINE & REHABILITATION     PROGRESS NOTE    Subjective/Complaints: Worked on stairs yesterday. W/c moved while she was transferring and she felt something pinch in her stomach. Feels back at baseline this morning  ROS: Pt denies fever, rash/itching, headache, blurred or double vision, nausea, vomiting,   diarrhea, chest pain, shortness of breath, palpitations, dysuria, dizziness, neck or back pain, bleeding, or depression    Objective: Vital Signs: Blood pressure 146/58, pulse 60, temperature 98.6 F (37 C), temperature source Oral, resp. rate 18, height 5\' 7"  (1.702 m), weight 131.226 kg (289 lb 4.8 oz), last menstrual period 11/18/2012, SpO2 98 %. No results found. No results for input(s): WBC, HGB, HCT, PLT in the last 72 hours. No results for input(s): NA, K, CL, GLUCOSE, BUN, CREATININE, CALCIUM in the last 72 hours.  Invalid input(s): CO CBG (last 3)  No results for input(s): GLUCAP in the last 72 hours.  Wt Readings from Last 3 Encounters:  05/18/16 131.226 kg (289 lb 4.8 oz)  05/02/16 141 kg (310 lb 13.6 oz)  01/07/14 138.347 kg (305 lb)    Physical Exam:  Constitutional: She is oriented to person, place, and time. She appears well-developed and well-nourished.  Morbidly obese female  HENT: ear wax Head: Normocephalic and atraumatic.  Eyes: Conjunctivae and EOM are normal. Pupils are equal, round, and reactive to light.  Neck:  Trach site closed.   Cardiovascular: Normal rate and regular rhythm.  No murmur heard. Respiratory: Effort normal. No stridor. She has decreased breath sounds in the right lower field and the left lower field. She has wheezes. She has rhonchi.  GI: Soft. Bowel sounds are normal. She exhibits no distension. There is tenderness.  Ostomy with  Semi-formed stool. area dry surrounding Musculoskeletal: She exhibits edema. Crepitus with left knee ROM.  Valgus deformity Neurological: She is alert and oriented to person,  place, and time.  Motor:  Bilateral upper extremities 4+/5 proximal to distal Bilateral lower extremities hip flexion, knee extension 4-/5, ankle dorsi/plantarflexion 4+/5 Sensation diminished to light touch bilateral feet  Skin: Skin is warm and dry.  Midline abdominal incision with retention sutures/granulation tissue still around 50%--remainder fibronecrotic. Lower wound with 100% granulation. Drainage much improved. Area tender to palpation. Surrounding skin less macerated    Psychiatric: Her speech is normal. Her affect is appropriate at the moment     Assessment/Plan: 1. Debility secondary to weakness from multiple medical issues/respiratory failure which require 3+ hours per day of interdisciplinary therapy in a comprehensive inpatient rehab setting. Physiatrist is providing close team supervision and 24 hour management of active medical problems listed below. Physiatrist and rehab team continue to assess barriers to discharge/monitor patient progress toward functional and medical goals.  Function:  Bathing Bathing position   Position: Wheelchair/chair at sink  Bathing parts Body parts bathed by patient: Right arm, Left arm, Chest, Front perineal area, Right upper leg, Left upper leg, Abdomen, Buttocks Body parts bathed by helper: Back  Bathing assist Assist Level: Touching or steadying assistance(Pt > 75%)      Upper Body Dressing/Undressing Upper body dressing   What is the patient wearing?: Button up shirt     Pull over shirt/dress - Perfomed by patient: Thread/unthread right sleeve, Thread/unthread left sleeve, Put head through opening Pull over shirt/dress - Perfomed by helper: Pull shirt over trunk Button up shirt - Perfomed by patient: Thread/unthread right sleeve, Thread/unthread left sleeve, Button/unbutton shirt Button up shirt - Perfomed by helper: Pull shirt  around back    Upper body assist Assist Level: Set up   Set up : To obtain clothing/put away  Lower  Body Dressing/Undressing Lower body dressing Lower body dressing/undressing activity did not occur: Refused What is the patient wearing?: Non-skid slipper socks     Pants- Performed by patient: Thread/unthread right pants leg, Thread/unthread left pants leg Pants- Performed by helper: Pull pants up/down Non-skid slipper socks- Performed by patient: Don/doff right sock, Don/doff left sock Non-skid slipper socks- Performed by helper: Don/doff right sock, Don/doff left sock                  Lower body assist Assist for lower body dressing: Touching or steadying assistance (Pt > 75%)      Toileting Toileting   Toileting steps completed by patient: Adjust clothing prior to toileting, Performs perineal hygiene, Adjust clothing after toileting Toileting steps completed by helper: Adjust clothing prior to toileting, Performs perineal hygiene, Adjust clothing after toileting Toileting Assistive Devices: Other (comment)  Toileting assist Assist level: Two helpers   Transfers Chair/bed transfer   Chair/bed transfer method: Stand pivot, Ambulatory Chair/bed transfer assist level: Touching or steadying assistance (Pt > 75%) Chair/bed transfer assistive device: Armrests, Walker Mechanical lift: Architectural technologist activity did not occur: Safety/medical concerns   Max distance: 27 Assist level: Supervision or verbal cues   Wheelchair   Type: Manual Max wheelchair distance: 20 Assist Level: Supervision or verbal cues  Cognition Comprehension Comprehension assist level: Follows basic conversation/direction with no assist  Expression Expression assist level: Expresses complex ideas: With no assist  Social Interaction Social Interaction assist level: Interacts appropriately with others - No medications needed.  Problem Solving Problem solving assist level: Solves complex problems: With extra time  Memory Memory assist level: Requires cues to use assistive device    Medical Problem List and Plan: 1. Weakness, decreased endurance secondary to debility from multiple medical issues  -continue CIR-PT, OT, now tolerating much better  -continue to work through [place] 2. DVT Prophylaxis/Anticoagulation: Pharmaceutical: Lovenox  -remains indicated  -dopplers negative 3. 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 4. Mood: LCSW to follow for evaluation and support.   -continued scheduled low dose xanax to assist with anxiety  5. Neuropsych: This patient is capable of making decisions on her own behalf. 6. Skin/Wound Care:  drainage much less. Still with large central area of granulation with fibronecrotic tissue.   -wound dressings per surgery.    -ostomy competent, being changed today,  -pt to be more involved with ostomy care, patient states that her husband will be changing the ostomy at home and she will be cleaning the area   -nursing to follow up 7. Fluids/Electrolytes/Nutrition: Monitor I/O.   Renal status stable. Add supplements to help manage low protein stores. 8. ABLA: H/H stable at 7.6-8.0 range.---7.6 most recently - iron deficiency anemia  -B12 normal.   -Fe++ supp    9. COPD/OSA in setting of morbid obesity:  Continue brovana bid and Pulmicort bid.   - decannulated---doing well. Wound  closed 10. Hypothyroid: On supplement.  11. Morbid Obesity  Diet and exercise education. Pt was highly sedentary PTA 12. Tobacco abuse: Continue counseling 13. Bilateral knee pain left knee brace  as above 14. Hypothyroidism Continue medications  -TSH elevated by free T4 normal/high--no changes 15. Ear wax---debrox completed  with good results    LOS (Days) 16 A FACE TO FACE EVALUATION WAS PERFORMED  Kellyn Mccary E 05/18/2016 8:59 AM

## 2016-05-18 NOTE — Progress Notes (Signed)
Physical Therapy Session Note  Patient Details  Name: Christina Castaneda MRN: [id number] Date of Birth: 04-15-1956  Today's Date: 05/18/2016 PT Individual Time: 0830-0915 PT Individual Time Calculation (min): 45 min   Short Term Goals: Week 3:  PT Short Term Goal 1 (Week 3): =LTGs due to ELOS  Skilled Therapeutic Interventions/Progress Updates:    PA finishing dressing change upon PT arrival. Pt requesting to work on [employer] again today stating she felt like it made her left leg work better yesterday. Suggested to do this afternoon with other PT as goal this session was to practice car transfer.  Pt getting to EOB mod I using rail for support. 3 attempts for sit -> stand with SW from EOB but able to do with light mod assist and cues for technique.Gait with SW with supervision in/out of bathroom and performed toilet transfers with close supervision/steady assist including donning underwear in the bathroom. Pt performed sit <> stands from w/c with supervision to min assist. Simulated car transfer to sedan height with steady assist to get into car but heavy mod assist to get out due to low surface sit <> stand. Educated on importance of practicing this and lower transfers throughout the next week. Pt able to gait x 20' with supervision in hallway and set up in recliner upon return to room. RT present to administer breathing treatment.   Therapy Documentation Precautions:  Precautions Precautions: Fall, Other (comment) Precaution Comments: room air Required Braces or Orthoses: Other Brace/Splint Other Brace/Splint: abdominal binder loosely over abdominal wound and ostomy site Restrictions Weight Bearing Restrictions: No  Pain: Premedicated for abdominal discomfort.      See Function Navigator for Current Functional Status.   Therapy/Group: Individual Therapy  Canary Brim [name], PT, DPT  05/18/2016, 11:01 AM

## 2016-05-18 NOTE — Consult Note (Signed)
WOC met with patient and her wife.  They are up playing cards. She reports new pouch I changed on Tuesday is doing well and she will change with bedside nurse later in the day. She is in really good spirits today.  I have let the bedside nurse know to assist with pouch change later today.  Her husband request training session with him next week to have him do all the care to prepare for DC.  Will plan for that on Tuesday May 30th am.   WOC will follow along with you for continued support with ostomy teaching and care Kahi Mohala RN,CWOCN [phone]

## 2016-05-18 NOTE — Progress Notes (Signed)
Occupational Therapy Session Note  Patient Details  Name: DARIANY GRAUNKE MRN: [id number] Date of Birth: 1956/03/25  Today's Date: 05/18/2016 OT Individual Time: 1100-1200 and 1300-1330 OT Individual Time Calculation (min): 60 min and 30 min   Short Term Goals: Week 3:  OT Short Term Goal 1 (Week 3): STG=LTG due to LOS  Skilled Therapeutic Interventions/Progress Updates:    Session One: Pt seen for OT ADL bathing/dressing session. Pt sitting up in recliner upon arrival, agreeable to tx session. She ambulated ~7 ft to w/c using RW with CGA. She bathed at sink, standing to complete UB bathing and grooming tasks in order to increase activity tolerance. Seated rest breaks required btwn tasks and she relied heavily on UE support during standing. LH sponge used to wash LEs.  She ambulated into bathroom and stood at RW to empty ostomy into toilet following VCs and demonstration for technique of standing position. She was taken to ADL apartment and ambulated throughout kitchen with RW. She stood at cabinet to remove and replace items in kitchen cabinet. She required seated rest breaks throughout. She required +2 assist to stand from chair without arm rests. She returned to room at end of session, left sitting in w/c set up with meal tray and all needs in reach.   Session Two: Pt seen for OT session focusing on functional ambulation and activity tolerance. Pt sitting up in w/c upon arrival, agreeable to tx session and desiring to complete exercise on NuStep. In gym, ambulated ~62ft with RW. Completed 12 min on NuStep at level 4 initially progressing to level 5. Visual cues provided to pt to keep steps per minute greater than 40 for continuous 2 minutes and then greater than 50 SPM for last 2 minutes. She ambulated 46ft back to w/c, required to weave through cones to simulate household environmental obstacles.  She returned to room at end of session, left with all needs in reach.   Therapy  Documentation Precautions:  Precautions Precautions: Fall, Other (comment) Precaution Comments: room air Required Braces or Orthoses: Other Brace/Splint Other Brace/Splint: abdominal binder loosely over abdominal wound and ostomy site Restrictions Weight Bearing Restrictions: No Pain: No/ denies pain ADL: ADL ADL Comments: refer to functional navigator  See Function Navigator for Current Functional Status.   Therapy/Group: Individual Therapy  Lewis, Philamena Kramar C 05/18/2016, 7:18 AM

## 2016-05-19 ENCOUNTER — Inpatient Hospital Stay (HOSPITAL_COMMUNITY): Payer: BLUE CROSS/BLUE SHIELD | Admitting: Physical Therapy

## 2016-05-19 ENCOUNTER — Inpatient Hospital Stay (HOSPITAL_COMMUNITY): Payer: BLUE CROSS/BLUE SHIELD | Admitting: Occupational Therapy

## 2016-05-19 ENCOUNTER — Inpatient Hospital Stay (HOSPITAL_COMMUNITY): Payer: BLUE CROSS/BLUE SHIELD

## 2016-05-19 NOTE — Progress Notes (Signed)
Physical Therapy Session Note  Patient Details  Name: Christina Castaneda MRN: [id number] Date of Birth: Feb 27, 1956  Today's Date: 05/19/2016 PT Individual Time: 1500-1530 PT Individual Time Calculation (min): 30 min   Short Term Goals: Week 1:  PT Short Term Goal 1 (Week 1): Pt will be able to perform sit <> stands with mod assist using RW PT Short Term Goal 1 - Progress (Week 1): Met PT Short Term Goal 2 (Week 1): Pt will be able to initiate gait with +2 assist PT Short Term Goal 2 - Progress (Week 1): Met PT Short Term Goal 3 (Week 1): Pt will be able to tolerate OOB x 1 hour between therapy sessions to increase overall endurance/activity tolerance PT Short Term Goal 3 - Progress (Week 1): Met  Skilled Therapeutic Interventions/Progress Updates:  Pt was seen bedside in the pm. Pt transported to rehab gym. Pt transferred w/c to recumbent bike with standard walker and min guard. Pt rode recumbent bike 15 minutes at level 3 with 3 rest breaks. Pt transferred bike to w/c with standard walker and min guard. Pt returned to room. Pt transferred w/c to edge of bed with standard walker and min guard. Pt transferred edge of bed to supine with mod A and verbal cues. Pt left sitting up in bed with call bell within reach.   Therapy Documentation Precautions:  Precautions Precautions: Fall, Other (comment) Precaution Comments: room air Required Braces or Orthoses: Other Brace/Splint Other Brace/Splint: abdominal binder loosely over abdominal wound and ostomy site Restrictions Weight Bearing Restrictions: No General:   Vital Signs:  Pain: No c/o pain.   See Function Navigator for Current Functional Status.   Therapy/Group: Individual Therapy  Dub Amis 05/19/2016, 3:37 PM

## 2016-05-19 NOTE — Progress Notes (Signed)
[place] PHYSICAL MEDICINE & REHABILITATION     PROGRESS NOTE    Subjective/Complaints: No issues overnight.. Using nebulizer treatment currently.  ROS: Pt denies fever, rash/itching, headache, blurred or double vision, nausea, vomiting,   diarrhea, chest pain, shortness of breath, palpitations, dysuria, dizziness, neck or back pain,     Objective: Vital Signs: Blood pressure 136/44, pulse 58, temperature 97.7 F (36.5 C), temperature source Oral, resp. rate 20, height 5\' 7"  (1.702 m), weight 131.09 kg (289 lb), last menstrual period 11/18/2012, SpO2 99 %. No results found.  Recent Labs  05/18/16 0935  WBC 8.7  HGB 9.4*  HCT 32.4*  PLT 398    Recent Labs  05/18/16 0935  NA 132*  K 4.3  CL 98*  GLUCOSE 88  BUN 10  CREATININE 0.99  CALCIUM 9.1   CBG (last 3)  No results for input(s): GLUCAP in the last 72 hours.  Wt Readings from Last 3 Encounters:  05/19/16 131.09 kg (289 lb)  05/02/16 141 kg (310 lb 13.6 oz)  01/07/14 138.347 kg (305 lb)    Physical Exam:  Constitutional: She is oriented to person, place, and time. She appears well-developed and well-nourished.  Morbidly obese female  HENT: ear wax Head: Normocephalic and atraumatic.  Eyes: Conjunctivae and EOM are normal. Pupils are equal, round, and reactive to light.  Neck:  Trach site closed.   Cardiovascular: Normal rate and regular rhythm.  No murmur heard. Respiratory: Effort normal. No stridor. She has decreased breath sounds in the right lower field and the left lower field. She has wheezes. She has rhonchi.  GI: Soft. Bowel sounds are normal. She exhibits no distension. There is tenderness.  Ostomy with  Semi-formed stool. area dry surrounding Musculoskeletal: She exhibits edema. Crepitus with left knee ROM.  Valgus deformity Neurological: She is alert and oriented to person, place, and time.  Motor:  Bilateral upper extremities 4+/5 proximal to distal Bilateral lower extremities hip  flexion, knee extension 4-/5, ankle dorsi/plantarflexion 4+/5 Sensation diminished to light touch bilateral feet  Skin: Skin is warm and dry.  Midline abdominal incision with retention sutures/granulation tissue still around 50%--remainder fibronecrotic. Lower wound with 100% granulation. Drainage much improved. Area tender to palpation. Surrounding skin less macerated    Psychiatric: Her speech is normal. Her affect is appropriate at the moment     Assessment/Plan: 1. Debility secondary to weakness from multiple medical issues/respiratory failure which require 3+ hours per day of interdisciplinary therapy in a comprehensive inpatient rehab setting. Physiatrist is providing close team supervision and 24 hour management of active medical problems listed below. Physiatrist and rehab team continue to assess barriers to discharge/monitor patient progress toward functional and medical goals.  Function:  Bathing Bathing position   Position: Wheelchair/chair at sink  Bathing parts Body parts bathed by patient: Right arm, Left arm, Chest, Right upper leg, Left upper leg, Abdomen Body parts bathed by helper: Back  Bathing assist Assist Level: Touching or steadying assistance(Pt > 75%)      Upper Body Dressing/Undressing Upper body dressing   What is the patient wearing?: Button up shirt     Pull over shirt/dress - Perfomed by patient: Thread/unthread right sleeve, Thread/unthread left sleeve, Put head through opening Pull over shirt/dress - Perfomed by helper: Pull shirt over trunk Button up shirt - Perfomed by patient: Thread/unthread right sleeve, Thread/unthread left sleeve, Pull shirt around back Button up shirt - Perfomed by helper: Pull shirt around back    Upper body assist Assist  Level: Set up   Set up : To obtain clothing/put away  Lower Body Dressing/Undressing Lower body dressing Lower body dressing/undressing activity did not occur: Refused What is the patient wearing?:  Non-skid slipper socks     Pants- Performed by patient: Thread/unthread right pants leg, Thread/unthread left pants leg Pants- Performed by helper: Pull pants up/down Non-skid slipper socks- Performed by patient: Don/doff right sock, Don/doff left sock Non-skid slipper socks- Performed by helper: Don/doff right sock, Don/doff left sock                  Lower body assist Assist for lower body dressing: Touching or steadying assistance (Pt > 75%)      Toileting Toileting   Toileting steps completed by patient: Adjust clothing prior to toileting, Performs perineal hygiene, Adjust clothing after toileting Toileting steps completed by helper: Adjust clothing prior to toileting, Performs perineal hygiene, Adjust clothing after toileting Toileting Assistive Devices: Grab bar or rail  Toileting assist Assist level: Two helpers   Transfers Chair/bed transfer   Chair/bed transfer method: Ambulatory Chair/bed transfer assist level: Touching or steadying assistance (Pt > 75%) Chair/bed transfer assistive device: Armrests, Environmental manager lift: Ecologist Ambulation activity did not occur: Safety/medical concerns   Max distance: 20' Assist level: Supervision or verbal cues   Wheelchair   Type: Manual Max wheelchair distance: 20 Assist Level: Supervision or verbal cues  Cognition Comprehension Comprehension assist level: Follows complex conversation/direction with extra time/assistive device  Expression Expression assist level: Expresses complex ideas: With extra time/assistive device  Social Interaction Social Interaction assist level: Interacts appropriately with others with medication or extra time (anti-anxiety, antidepressant).  Problem Solving Problem solving assist level: Solves complex problems: With extra time  Memory Memory assist level: Recognizes or recalls 90% of the time/requires cueing < 10% of the time   Medical Problem List and Plan: 1.  Weakness, decreased endurance secondary to debility from multiple medical issues  -continue CIR-PT, OT, now tolerating much better  -continue to work through [place] 2. DVT Prophylaxis/Anticoagulation: Pharmaceutical: Lovenox  -remains indicated  -dopplers negative 3. 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 4. Mood: LCSW to follow for evaluation and support.   -continued scheduled low dose xanax to assist with anxiety  5. Neuropsych: This patient is capable of making decisions on her own behalf. 6. Skin/Wound Care:  drainage much less. Still with large central area of granulation with fibronecrotic tissue.   -wound dressings per surgery.    -ostomy competent, being changed today,  -pt to be more involved with ostomy care, patient states that her husband will be changing the ostomy at home and she will be cleaning the area   -nursing to follow up 7. Fluids/Electrolytes/Nutrition: Monitor I/O.   Renal status stable. Add supplements to help manage low protein stores. 8. ABLA: H/H stable at 7.6-8.0 range.---7.6 most recently - iron deficiency anemia  -B12 normal.   -Fe++ supp    9. COPD/OSA in setting of morbid obesity:  Continue brovana bid and Pulmicort bid.   - decannulated---doing well. Wound  closed 10. Hypothyroid: On supplement.  11. Morbid Obesity  Diet and exercise education. Pt was highly sedentary PTA 12. Tobacco abuse: Continue counseling 13. Bilateral knee pain  left knee brace as above 14. Hypothyroidism Continue medications  -TSH elevated by free T4 normal/high--no changes 15. Ear wax---debrox completed with good results    LOS (Days) 17 A  FACE TO FACE EVALUATION WAS PERFORMED  Alysia Penna E 05/19/2016 9:52 AM

## 2016-05-19 NOTE — Progress Notes (Signed)
Physical Therapy Session Note  Patient Details  Name: Christina Castaneda MRN: [id number] Date of Birth: 01-17-1956  Today's Date: 05/19/2016 PT Individual Time: 0900-1000 PT Individual Time Calculation (min): 60 min   Short Term Goals: Week 1:  PT Short Term Goal 1 (Week 1): Pt will be able to perform sit <> stands with mod assist using RW PT Short Term Goal 1 - Progress (Week 1): Met PT Short Term Goal 2 (Week 1): Pt will be able to initiate gait with +2 assist PT Short Term Goal 2 - Progress (Week 1): Met PT Short Term Goal 3 (Week 1): Pt will be able to tolerate OOB x 1 hour between therapy sessions to increase overall endurance/activity tolerance PT Short Term Goal 3 - Progress (Week 1): Met  Skilled Therapeutic Interventions/Progress Updates:  Pt was seen bedside in the am. Donned L knee brace prior to treatment. Pt transferred supine to edge of bed with side rail and no assist with increased time. Pt transferred edge of bed to w/c with standard walker and min A with verbal cues. Pt propelled w/c about 100 feet with B UEs and rest breaks. In gym treatment focused on ambulation. Pt ambulated with standard walker, 25 feet x 3 with S and verbal cues. Following treatment pt returned to room and left sitting up in w/c with call bell within reach.   Therapy Documentation Precautions:  Precautions Precautions: Fall, Other (comment) Precaution Comments: room air Required Braces or Orthoses: Other Brace/Splint Other Brace/Splint: abdominal binder loosely over abdominal wound and ostomy site Restrictions Weight Bearing Restrictions: No General:   Pain: No c/o pain.   See Function Navigator for Current Functional Status.   Therapy/Group: Individual Therapy  Dub Amis 05/19/2016, 12:34 PM

## 2016-05-19 NOTE — Progress Notes (Signed)
Occupational Therapy Session Note  Patient Details  Name: DHANVI BOESEN MRN: [id number] Date of Birth: 1956-02-12  Today's Date: 05/19/2016 OT Individual Time: 1300-1415 OT Individual Time Calculation (min): 75 min    Short Term Goals: Week 1:  OT Short Term Goal 1 (Week 1): Pt will be able to sit to stand from toilet with mod A.  OT Short Term Goal 1 - Progress (Week 1): Partly met OT Short Term Goal 2 (Week 1): Pt will perform toileting with mod A for clothing management. OT Short Term Goal 2 - Progress (Week 1): Not met OT Short Term Goal 3 (Week 1): Pt will don pants with mod A. OT Short Term Goal 3 - Progress (Week 1): Discontinued (comment) (Pt does not wear pants) OT Short Term Goal 4 (Week 1): Pt will tolerate standing at the sink for 1 min. OT Short Term Goal 4 - Progress (Week 1): Not met Week 2:  OT Short Term Goal 1 (Week 2): Pt will complete functional transfers with supervision consistently OT Short Term Goal 1 - Progress (Week 2): Met OT Short Term Goal 2 (Week 2): Pt will tolerate full OT bathing/ dressing with only 1 rest break in order to increase functional activity tolerance OT Short Term Goal 2 - Progress (Week 2): Partly met OT Short Term Goal 3 (Week 2): Pt will complete toileting task with supervision OT Short Term Goal 3 - Progress (Week 2): Met OT Short Term Goal 4 (Week 2): Pt will complete one grooming task in standing in order to increase activity tolerance.  OT Short Term Goal 4 - Progress (Week 2): Met Week 3:  OT Short Term Goal 1 (Week 3): STG=LTG due to LOS Week 4:     Skilled Therapeutic Interventions/Progress Updates:    Engaged in wc mobility, sit to stand, sttanding balance, functional mobility.  Pt propelled wc about 25 feet before needing rest break.continued x2 with rest break in between each 25 feet.  .    Pt used walker in the kitchen for gathering supplies and then made sandwich at wc level.  Pt ambulated to wash hand, get supplies out of  refrigerator and then returned them to the proper spot. She was min to SBA with functional mobility.  She need education on  Reaching a safe distance and not over reaching.   Propelled wc back to room with 3 rest breaks.for 75 feet.      Therapy Documentation Precautions:  Precautions Precautions: Fall, Other (comment) Precaution Comments: room air Required Braces or Orthoses: Other Brace/Splint Other Brace/Splint: abdominal binder loosely over abdominal wound and ostomy site Restrictions Weight Bearing Restrictions: No      Pain:  None.     ADL ADL Comments: refer to functional navigator        See Function Navigator for Current Functional Status.   Therapy/Group: Individual Therapy  Lisa Roca 05/19/2016, 1:47 PM

## 2016-05-19 NOTE — Progress Notes (Signed)
RT set up patient home unit CPAP. Patient needs no O2 bleed. Patient states she is able to put her mask on herself

## 2016-05-19 NOTE — Progress Notes (Signed)
Occupational Therapy Session Note  Patient Details  Name: Christina Castaneda MRN: [id number] Date of Birth: 06/24/1956  Today's Date: 05/19/2016 OT Individual Time: 1130-1200 OT Individual Time Calculation (min): 30 min    Short Term Goals: Week 2:  OT Short Term Goal 1 (Week 2): Pt will complete functional transfers with supervision consistently OT Short Term Goal 1 - Progress (Week 2): Met OT Short Term Goal 2 (Week 2): Pt will tolerate full OT bathing/ dressing with only 1 rest break in order to increase functional activity tolerance OT Short Term Goal 2 - Progress (Week 2): Partly met OT Short Term Goal 3 (Week 2): Pt will complete toileting task with supervision OT Short Term Goal 3 - Progress (Week 2): Met OT Short Term Goal 4 (Week 2): Pt will complete one grooming task in standing in order to increase activity tolerance.  OT Short Term Goal 4 - Progress (Week 2): Met  Skilled Therapeutic Interventions/Progress Updates:   OT session focused on functional mobility, standing balance, and activity tolerance. Pt received sitting in w/c requesting to complete bathing. Prior to bathing, pt ambulated w/c<>toilet (apprx 10 feet) with CGA using RW. Pt completed toileting task with CGA for standing balance. Pt completed bathing sit<>stand at sink with use of LH sponge to assist with washing lower BLEs. Pt completed sit<>stand from w/c 2x at supervision level. Pt required minimal rest breaks today during bathing. At end of session pt left sitting in w/c with all needs in reach.   Therapy Documentation Precautions:  Precautions Precautions: Fall, Other (comment) Precaution Comments: room air Required Braces or Orthoses: Other Brace/Splint Other Brace/Splint: abdominal binder loosely over abdominal wound and ostomy site Restrictions Weight Bearing Restrictions: No General:   Vital Signs: Oxygen Therapy O2 Device: Not Delivered Pain: 3/10 pain  See Function Navigator for Current  Functional Status.   Therapy/Group: Individual Therapy  Duayne Cal 05/19/2016, 11:56 AM

## 2016-05-20 ENCOUNTER — Inpatient Hospital Stay (HOSPITAL_COMMUNITY): Payer: BLUE CROSS/BLUE SHIELD | Admitting: Physical Therapy

## 2016-05-20 MED ORDER — FENTANYL 25 MCG/HR TD PT72
75.0000 ug | MEDICATED_PATCH | TRANSDERMAL | Status: DC
  Administered 2016-05-20: 75 ug via TRANSDERMAL
  Filled 2016-05-20: qty 3

## 2016-05-20 NOTE — Progress Notes (Signed)
Physical Therapy Session Note  Patient Details  Name: LLESENIA TROTH MRN: [id number] Date of Birth: Oct 06, 1956  Today's Date: 05/20/2016 PT Individual Time: 0920-0944 PT Individual Time Calculation (min): 24 min   Short Term Goals: Week 3:  PT Short Term Goal 1 (Week 3): =LTGs due to ELOS  Skilled Therapeutic Interventions/Progress Updates:    Pt received in bed & agreeable to PT, noting 2/10 pain in abdomen. PT provided total A for donning L knee brace & socks, pt transferred supine>sit from flat bed with use of bed rails & mod I. Sit<>stand transfers completed with Min A from bed & toilet. Gait training x 10 ft + 25 ft with SW & Min Guard assistance with rest breaks in between 2/2 fatigue. Educated pt on pursed lip breathing & pt able to return demonstrate. Pt required min A to transfer sitting EOB>supine as she required assistance with LLE. Pt able to independently reposition in bed with use of bed rails. At end of session pt left in bed with all needs within reach.   Therapy Documentation Precautions:  Precautions Precautions: Fall, Other (comment) Precaution Comments: room air Required Braces or Orthoses: Other Brace/Splint Other Brace/Splint: abdominal binder loosely over abdominal wound and ostomy site Restrictions Weight Bearing Restrictions: No Pain: Pain Assessment Pain Assessment: 0-10 Pain Score: 2  Pain Location: Abdomen Pain Intervention(s): Ambulation/increased activity   See Function Navigator for Current Functional Status.   Therapy/Group: Individual Therapy  Waunita Schooner 05/20/2016, 7:57 AM

## 2016-05-20 NOTE — Progress Notes (Signed)
Patient able to place herself on home CPAP for the night

## 2016-05-20 NOTE — Progress Notes (Signed)
[place] PHYSICAL MEDICINE & REHABILITATION     PROGRESS NOTE    Subjective/Complaints: No issues overnight.. Requesting therapeutic pass. Husband will be learning dressing changes  ROS: Pt denies fever, rash/itching, headache, blurred or double vision, nausea, vomiting,   diarrhea, chest pain, shortness of breath, palpitations, dysuria, dizziness, neck or back pain,     Objective: Vital Signs: Blood pressure 155/55, pulse 56, temperature 97.6 F (36.4 C), temperature source Oral, resp. rate 20, height 5\' 7"  (1.702 m), weight 116.121 kg (256 lb), last menstrual period 11/18/2012, SpO2 100 %. No results found.  Recent Labs  05/18/16 0935  WBC 8.7  HGB 9.4*  HCT 32.4*  PLT 398    Recent Labs  05/18/16 0935  NA 132*  K 4.3  CL 98*  GLUCOSE 88  BUN 10  CREATININE 0.99  CALCIUM 9.1   CBG (last 3)  No results for input(s): GLUCAP in the last 72 hours.  Wt Readings from Last 3 Encounters:  05/20/16 116.121 kg (256 lb)  05/02/16 141 kg (310 lb 13.6 oz)  01/07/14 138.347 kg (305 lb)    Physical Exam:  Constitutional: She is oriented to person, place, and time. She appears well-developed and well-nourished.  Morbidly obese female  HENT: ear wax Head: Normocephalic and atraumatic.  Eyes: Conjunctivae and EOM are normal. Pupils are equal, round, and reactive to light.  Neck:  Trach site closed.   Cardiovascular: Normal rate and regular rhythm.  No murmur heard. Respiratory: Effort normal. No stridor. She has decreased breath sounds in the right lower field and the left lower field. She has wheezes. She has rhonchi.  GI: Soft. Bowel sounds are normal. She exhibits no distension. There is tenderness.  Ostomy with  liquidy stool. area dry surrounding Musculoskeletal: She exhibits edema. Crepitus with left knee ROM.  Valgus deformity Neurological: She is alert and oriented to person, place, and time.  Motor:  Bilateral upper extremities 4+/5 proximal to  distal Bilateral lower extremities hip flexion, knee extension 4-/5, ankle dorsi/plantarflexion 4+/5 Sensation diminished to light touch bilateral feet  Skin: Skin is warm and dry.    Psychiatric: Her speech is normal. Her affect is appropriate at the moment     Assessment/Plan: 1. Debility secondary to weakness from multiple medical issues/respiratory failure which require 3+ hours per day of interdisciplinary therapy in a comprehensive inpatient rehab setting. Physiatrist is providing close team supervision and 24 hour management of active medical problems listed below. Physiatrist and rehab team continue to assess barriers to discharge/monitor patient progress toward functional and medical goals.  Function:  Bathing Bathing position   Position: Wheelchair/chair at sink  Bathing parts Body parts bathed by patient: Right arm, Left arm, Chest, Right upper leg, Left upper leg, Abdomen, Front perineal area Body parts bathed by helper: Left lower leg, Right lower leg, Back, Front perineal area  Bathing assist Assist Level: Touching or steadying assistance(Pt > 75%)      Upper Body Dressing/Undressing Upper body dressing   What is the patient wearing?: Button up shirt     Pull over shirt/dress - Perfomed by patient: Thread/unthread right sleeve, Thread/unthread left sleeve, Put head through opening Pull over shirt/dress - Perfomed by helper: Pull shirt over trunk Button up shirt - Perfomed by patient: Thread/unthread right sleeve, Thread/unthread left sleeve, Pull shirt around back, Button/unbutton shirt Button up shirt - Perfomed by helper: Pull shirt around back    Upper body assist Assist Level: Set up   Set up : To  obtain clothing/put away  Lower Body Dressing/Undressing Lower body dressing Lower body dressing/undressing activity did not occur: Refused What is the patient wearing?: Non-skid slipper socks, Underwear Underwear - Performed by patient: Thread/unthread right  underwear leg, Thread/unthread left underwear leg, Pull underwear up/down   Pants- Performed by patient: Thread/unthread right pants leg, Thread/unthread left pants leg Pants- Performed by helper: Pull pants up/down Non-skid slipper socks- Performed by patient: Don/doff right sock, Don/doff left sock Non-skid slipper socks- Performed by helper: Don/doff right sock, Don/doff left sock                  Lower body assist Assist for lower body dressing: Touching or steadying assistance (Pt > 75%)      Toileting Toileting Toileting activity did not occur: Refused Toileting steps completed by patient: Adjust clothing prior to toileting, Performs perineal hygiene, Adjust clothing after toileting Toileting steps completed by helper: Adjust clothing prior to toileting, Performs perineal hygiene, Adjust clothing after toileting Toileting Assistive Devices: Grab bar or rail  Toileting assist Assist level: Touching or steadying assistance (Pt.75%)   Transfers Chair/bed transfer   Chair/bed transfer method: Ambulatory Chair/bed transfer assist level: Touching or steadying assistance (Pt > 75%) Chair/bed transfer assistive device: Armrests, Walker Mechanical lift: Ecologist Ambulation activity did not occur: Safety/medical concerns   Max distance: 25 Assist level: Supervision or verbal cues   Wheelchair   Type: Manual Max wheelchair distance: 100 Assist Level: Supervision or verbal cues  Cognition Comprehension Comprehension assist level: Follows complex conversation/direction with extra time/assistive device  Expression Expression assist level: Expresses complex ideas: With extra time/assistive device  Social Interaction Social Interaction assist level: Interacts appropriately with others with medication or extra time (anti-anxiety, antidepressant).  Problem Solving Problem solving assist level: Solves complex problems: With extra time  Memory Memory assist level:  Recognizes or recalls 90% of the time/requires cueing < 10% of the time   Medical Problem List and Plan: 1. Weakness, decreased endurance secondary to debility from multiple medical issues  -continue CIR-PT, OT, now tolerating much better  -Grounds pass 2. DVT Prophylaxis/Anticoagulation: Pharmaceutical: Lovenox  -remains indicated  -dopplers negative 3. 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 4. Mood: LCSW to follow for evaluation and support.   -continued scheduled low dose xanax to assist with anxiety  5. Neuropsych: This patient is capable of making decisions on her own behalf. 6. Skin/Wound Care:  drainage much less. Still with large central area of granulation with fibronecrotic tissue.   -wound dressings per surgery.    -ostomy competent, being changed today,  -pt to be more involved with ostomy care, patient states that her husband will be changing the ostomy at home and she will be cleaning the area   -nursing to follow up 7. Fluids/Electrolytes/Nutrition: Monitor I/O.   Renal status stable. Add supplements to help manage low protein stores. 8. ABLA: H/H improving.---9.4 most recently - iron deficiency anemia  -B12 normal.   -Fe++ supp CBC Latest Ref Rng 05/18/2016 05/07/2016 05/03/2016  WBC 4.0 - 10.5 K/uL 8.7 6.2 7.0  Hemoglobin 12.0 - 15.0 g/dL 9.4(L) 7.6(L) 7.5(L)  Hematocrit 36.0 - 46.0 % 32.4(L) 25.9(L) 24.6(L)  Platelets 150 - 400 K/uL 398 232 264  9. COPD/OSA in setting of morbid obesity:  Continue brovana bid and Pulmicort bid.   - decannulated---doing well. Wound  closed 10. Hypothyroid: On supplement.  11. Morbid Obesity  Diet and exercise education. Pt was highly sedentary PTA 12.  Tobacco abuse: Continue counseling 13. Bilateral knee pain left knee brace as above 14. Hypothyroidism Continue medications  -TSH elevated by free T4 normal/high--no changes     LOS (Days) 18 A FACE TO FACE EVALUATION WAS PERFORMED  Damiano Stamper E 05/20/2016 8:03 AM

## 2016-05-21 ENCOUNTER — Inpatient Hospital Stay (HOSPITAL_COMMUNITY): Payer: BLUE CROSS/BLUE SHIELD | Admitting: Physical Therapy

## 2016-05-21 ENCOUNTER — Inpatient Hospital Stay (HOSPITAL_COMMUNITY): Payer: BLUE CROSS/BLUE SHIELD | Admitting: Occupational Therapy

## 2016-05-21 MED ORDER — FENTANYL 25 MCG/HR TD PT72
50.0000 ug | MEDICATED_PATCH | TRANSDERMAL | Status: DC
  Administered 2016-05-23: 50 ug via TRANSDERMAL
  Filled 2016-05-21: qty 2

## 2016-05-21 NOTE — Progress Notes (Signed)
[place] [facility]ICAL MEDICINE & REHABILITATION     PROGRESS NOTE    Subjective/Complaints: No issues overnight.. Requesting therapeutic pass. Husband will be learning dressing changes  ROS: Pt denies fever, rash/itching, headache, blurred or double vision, nausea, vomiting,   diarrhea, chest pain, shortness of breath, palpitations, dysuria, dizziness, neck or back pain,     Objective: Vital Signs: Blood pressure 151/71, pulse 71, temperature 97.5 F (36.4 C), temperature source Oral, resp. rate 22, height 5\' 7"  (1.702 m), weight 125.193 kg (276 lb), last menstrual period 11/18/2012, SpO2 95 %. No results found. No results for input(s): WBC, HGB, HCT, PLT in the last 72 hours. No results for input(s): NA, K, CL, GLUCOSE, BUN, CREATININE, CALCIUM in the last 72 hours.  Invalid input(s): CO CBG (last 3)  No results for input(s): GLUCAP in the last 72 hours.  Wt Readings from Last 3 Encounters:  05/21/16 125.193 kg (276 lb)  05/02/16 141 kg (310 lb 13.6 oz)  01/07/14 138.347 kg (305 lb)    Physical Exam:  Constitutional: She is oriented to person, place, and time. She appears well-developed and well-nourished.  Morbidly obese female  HENT: ear wax Head: Normocephalic and atraumatic.  Eyes: Conjunctivae and EOM are normal. Pupils are equal, round, and reactive to light.  Neck:  Trach site closed.   Cardiovascular: Normal rate and regular rhythm.  No murmur heard. Respiratory: Effort normal. No stridor. She has decreased breath sounds in the right lower field and the left lower field. She has wheezes.  GI: Soft. Bowel sounds are normal. She exhibits no distension. There is tenderness.  Ostomy with  liquidy stool. area dry surrounding Musculoskeletal: She exhibits edema. Crepitus with left knee ROM.  Valgus deformity Neurological: She is alert and oriented to person, place, and time.  Motor:  Bilateral upper extremities 4+/5 proximal to distal Bilateral lower extremities  hip flexion, knee extension 4-/5, ankle dorsi/plantarflexion 4+/5 Sensation diminished to light touch bilateral feet  Skin: Skin is warm and dry.    Psychiatric: Her speech is normal. Her affect is appropriate at the moment     Assessment/Plan: 1. Debility secondary to weakness from multiple medical issues/respiratory failure which require 3+ hours per day of interdisciplinary therapy in a comprehensive inpatient rehab setting. Physiatrist is providing close team supervision and 24 hour management of active medical problems listed below. Physiatrist and rehab team continue to assess barriers to discharge/monitor patient progress toward functional and medical goals.  Function:  Bathing Bathing position   Position: Wheelchair/chair at sink  Bathing parts Body parts bathed by patient: Right arm, Left arm, Chest, Right upper leg, Left upper leg, Abdomen, Front perineal area, Right lower leg, Left lower leg Body parts bathed by helper: Back  Bathing assist Assist Level: Supervision or verbal cues      Upper Body Dressing/Undressing Upper body dressing   What is the patient wearing?: Pull over shirt/dress     Pull over shirt/dress - Perfomed by patient: Thread/unthread right sleeve, Thread/unthread left sleeve, Put head through opening Pull over shirt/dress - Perfomed by helper: Pull shirt over trunk Button up shirt - Perfomed by patient: Thread/unthread right sleeve, Thread/unthread left sleeve, Pull shirt around back, Button/unbutton shirt Button up shirt - Perfomed by helper: Pull shirt around back    Upper body assist Assist Level: Set up   Set up : To obtain clothing/put away  Lower Body Dressing/Undressing Lower body dressing Lower body dressing/undressing activity did not occur: Refused What is the patient wearing?: Non-skid  slipper socks, Underwear Underwear - Performed by patient: Thread/unthread right underwear leg, Thread/unthread left underwear leg, Pull underwear  up/down   Pants- Performed by patient: Thread/unthread right pants leg, Thread/unthread left pants leg Pants- Performed by helper: Pull pants up/down Non-skid slipper socks- Performed by patient: Don/doff right sock, Don/doff left sock Non-skid slipper socks- Performed by helper: Don/doff right sock, Don/doff left sock                  Lower body assist Assist for lower body dressing: Touching or steadying assistance (Pt > 75%)      Toileting Toileting Toileting activity did not occur: Refused Toileting steps completed by patient: Adjust clothing prior to toileting, Performs perineal hygiene, Adjust clothing after toileting Toileting steps completed by helper: Adjust clothing prior to toileting, Performs perineal hygiene, Adjust clothing after toileting Toileting Assistive Devices: Grab bar or rail  Toileting assist Assist level: Touching or steadying assistance (Pt.75%)   Transfers Chair/bed transfer   Chair/bed transfer method: Ambulatory Chair/bed transfer assist level: Touching or steadying assistance (Pt > 75%) Chair/bed transfer assistive device: Environmental manager lift: Ecologist Ambulation activity did not occur: Safety/medical concerns   Max distance: 25 ft Assist level: Touching or steadying assistance (Pt > 75%)   Wheelchair   Type: Manual Max wheelchair distance: 100 Assist Level: Supervision or verbal cues  Cognition Comprehension Comprehension assist level: Follows complex conversation/direction with extra time/assistive device  Expression Expression assist level: Expresses complex ideas: With extra time/assistive device  Social Interaction Social Interaction assist level: Interacts appropriately with others with medication or extra time (anti-anxiety, antidepressant).  Problem Solving Problem solving assist level: Solves complex problems: With extra time  Memory Memory assist level: Recognizes or recalls 90% of the time/requires cueing <  10% of the time   Medical Problem List and Plan: 1. Weakness, decreased endurance secondary to debility from multiple medical issues  -continue CIR-PT, OT,working on discharge planning this week   2. DVT Prophylaxis/Anticoagulation: Pharmaceutical: Lovenox  -remains indicated  -dopplers negative 3. 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 4. Mood: LCSW to follow for evaluation and support.   -continued scheduled low dose xanax to assist with anxiety  5. Neuropsych: This patient is capable of making decisions on her own behalf. 6. Skin/Wound Care:  drainage much less. Still with large central area of granulation with fibronecrotic tissue.   -wound dressings per surgery.    -ostomy competent, being changed today,  -pt to be more involved with ostomy care, patient states that her husband will be changing the ostomy at home and she will be cleaning the area   -nursing to follow up 7. Fluids/Electrolytes/Nutrition: Monitor I/O.   Renal status stable. Add supplements to help manage low protein stores. 8. ABLA: H/H improving.---9.4 most recently - iron deficiency anemia  -B12 normal.   -Fe++ supp CBC Latest Ref Rng 05/18/2016 05/07/2016 05/03/2016  WBC 4.0 - 10.5 K/uL 8.7 6.2 7.0  Hemoglobin 12.0 - 15.0 g/dL 9.4(L) 7.6(L) 7.5(L)  Hematocrit 36.0 - 46.0 % 32.4(L) 25.9(L) 24.6(L)  Platelets 150 - 400 K/uL 398 232 264       9. COPD/OSA in setting of morbid obesity:  Continue brovana  bid and Pulmicort bid. Still with occasional wheeze,add incentive spirometry  - decannulated---doing well. Wound  closed 10. Hypothyroid: On supplement.  11. Morbid Obesity   Diet and exercise education. Pt was highly sedentary PTA 12. Tobacco abuse: Continue counseling 13. Bilateral knee pain left knee brace as above 14. Hypothyroidism Continue medications  -TSH elevated by free T4 normal/high--no changes     LOS (Days) 19 A FACE TO FACE EVALUATION WAS PERFORMED  KIRSTEINS,ANDREW E 05/21/2016 10:09 AM

## 2016-05-21 NOTE — Progress Notes (Signed)
Occupational Therapy Session Note  Patient Details  Name: Christina Castaneda MRN: [id number] Date of Birth: 09-Sep-1956  Today's Date: 05/21/2016 OT Individual Time: 1405-1505 OT Individual Time Calculation (min): 60 min    Short Term Goals: Week 3:  OT Short Term Goal 1 (Week 3): STG=LTG due to LOS  Skilled Therapeutic Interventions/Progress Updates:    Treatment session with focus on activity tolerance and BUE strengthening.  Pt requested to address UE strengthening.  Pt completed 2 reps of 10 each with 2# dowel rod with overhead presses and chest presses.  Discussed increased tolerance and increased weight to 4# dowel rod with pt able to complete 2 sets of 10 each with increased time but good tolerance.  Pt completed bed mobility to sitting EOB with increased time and supervision. PNF pattern reaching with 4# medicine ball 2 sets of 10 each side. Sit > stand at EOB with min assist on 3rd attempt with cues for anterior weight shift and "nose over knees"  Engaged in 2 sets of 5 chest presses with 4# medicine ball before pt returned to sitting.  Pt reports "winded" after short standing activity.  Returned to bed and left with all needs in reach.  Therapy Documentation Precautions:  Precautions Precautions: Fall, Other (comment) Precaution Comments: room air Required Braces or Orthoses: Other Brace/Splint Other Brace/Splint: abdominal binder loosely over abdominal wound and ostomy site Restrictions Weight Bearing Restrictions: No Pain: Pain Assessment Pain Assessment: 0-10 Pain Score: 0-No pain Pain Type: Acute pain Pain Location: Generalized Pain Orientation: Left Pain Descriptors / Indicators: Aching Pain Onset: On-going Pain Intervention(s): Medication (See eMAR) ADL: ADL ADL Comments: refer to functional navigator  See Function Navigator for Current Functional Status.   Therapy/Group: Individual Therapy  Simonne Come 05/21/2016, 3:13 PM

## 2016-05-21 NOTE — Progress Notes (Signed)
Physical Therapy Session Note  Patient Details  Name: Christina Castaneda MRN: [id number] Date of Birth: 20-Jun-1956  Today's Date: 05/21/2016 PT Individual Time: 1110-1210 PT Individual Time Calculation (min): 60 min   Short Term Goals: Week 3:  PT Short Term Goal 1 (Week 3): =LTGs due to ELOS  Skilled Therapeutic Interventions/Progress Updates:   Patient in bed upon arrival with husband present, reporting need to urinate and empty ostomy bag. Patient sat edge of bed using rails with mod I and performed sit > stand after multiple attempts and cues for hand placement with min A. For remainder of session patient performed sit <> stand transfers with supervision. Patient ambulated to/from bathroom using standard walker with supervision and emptied bag seated with husband providing setup/clean up assist and performed hand hygiene standing at sink. Gait training using standard walker for a total of 110 ft with 4 seated rest breaks, patient reports ambulation distances limited by knees. Performed NuStep using BUE/BLE at Level 4 > 5 x 15 minutes total. Stair training up/down 3 (6") stairs using 2 rails with step-to pattern and supervision, ascending/descending forward to simulate home entry. Patient transferred to recliner and left sitting in recliner with BLE elevated and needs in reach, RN present. Patient tolerated session well with multiple seated rest breaks throughout.     Therapy Documentation Precautions:  Precautions Precautions: Fall, Other (comment) Precaution Comments: room air Required Braces or Orthoses: Other Brace/Splint Other Brace/Splint: abdominal binder loosely over abdominal wound and ostomy site Restrictions Weight Bearing Restrictions: No Pain: Pain Assessment Pain Assessment: 0-10 Pain Score: 2  Pain Type: Chronic pain Pain Location: Knee Pain Orientation: Left Pain Descriptors / Indicators: Aching Pain Onset: On-going Pain Intervention(s): Ambulation/increased  activity;Rest   See Function Navigator for Current Functional Status.   Therapy/Group: Individual Therapy  Laretta Alstrom 05/21/2016, 12:21 PM

## 2016-05-21 NOTE — Progress Notes (Signed)
Occupational Therapy Session Note  Patient Details  Name: TERRY ABILA MRN: [id number] Date of Birth: Sep 21, 1956  Today's Date: 05/21/2016 OT Individual Time:  -  0800-0915   (75 min)      Short Term Goals: Week 1:  OT Short Term Goal 1 (Week 1): Pt will be able to sit to stand from toilet with mod A.  OT Short Term Goal 1 - Progress (Week 1): Partly met OT Short Term Goal 2 (Week 1): Pt will perform toileting with mod A for clothing management. OT Short Term Goal 2 - Progress (Week 1): Not met OT Short Term Goal 3 (Week 1): Pt will don pants with mod A. OT Short Term Goal 3 - Progress (Week 1): Discontinued (comment) (Pt does not wear pants) OT Short Term Goal 4 (Week 1): Pt will tolerate standing at the sink for 1 min. OT Short Term Goal 4 - Progress (Week 1): Not met Week 2:  OT Short Term Goal 1 (Week 2): Pt will complete functional transfers with supervision consistently OT Short Term Goal 1 - Progress (Week 2): Met OT Short Term Goal 2 (Week 2): Pt will tolerate full OT bathing/ dressing with only 1 rest break in order to increase functional activity tolerance OT Short Term Goal 2 - Progress (Week 2): Partly met OT Short Term Goal 3 (Week 2): Pt will complete toileting task with supervision OT Short Term Goal 3 - Progress (Week 2): Met OT Short Term Goal 4 (Week 2): Pt will complete one grooming task in standing in order to increase activity tolerance.  OT Short Term Goal 4 - Progress (Week 2): Met Week 3:  OT Short Term Goal 1 (Week 3): STG=LTG due to LOS :     Skilled Therapeutic Interventions/Progress Updates:    Pt received in bed & agreeable to OT. Pt. Using bed pan with nursing. Pt rolled to left with bed rail and OT removed pan and cleaned posterior peri area.  Pt rolled to right and sat EOB with rails and no assistance.  . Sit<>stand transfers completed with Min A from bed.  Pt ambulated with RW to sink and sat to finish bathing and dressing activity.  Pt completed with  assistance washing Using LH sponge. Pt donned house dress.  Propelled wc 50 feet with 1 rest break.  Ambulated with RW 15 feet and returned to bed.  Pt went from sitting to supine with bed rail and no assistance.  . Pt able to independently reposition in bed with use of bed rails  Left pt in bed with  call bell,phone within reach.     Therapy Documentation Precautions:  Precautions Precautions: Fall, Other (comment) Precaution Comments: room air Required Braces or Orthoses: Other Brace/Splint Other Brace/Splint: abdominal binder loosely over abdominal wound and ostomy site Restrictions Weight Bearing Restrictions: No      Pain: Pain Assessment Pain Assessment: 0-10 Pain Score:  Pain Type: Acute pain Pain Location: Generalized Pain Intervention(s): Medication (See eMAR) ADL: ADL ADL Comments: refer to functional navigator     See Function Navigator for Current Functional Status.   Therapy/Group: Individual Therapy  Lisa Roca 05/21/2016, 7:48 AM

## 2016-05-22 ENCOUNTER — Inpatient Hospital Stay (HOSPITAL_COMMUNITY): Payer: BLUE CROSS/BLUE SHIELD | Admitting: Physical Therapy

## 2016-05-22 ENCOUNTER — Inpatient Hospital Stay (HOSPITAL_COMMUNITY): Payer: BLUE CROSS/BLUE SHIELD | Admitting: Occupational Therapy

## 2016-05-22 MED ORDER — SENNOSIDES-DOCUSATE SODIUM 8.6-50 MG PO TABS: 2.0000 | ORAL_TABLET | Freq: Two times a day (BID) | ORAL | Status: DC

## 2016-05-22 MED ORDER — FERROUS SULFATE 325 (65 FE) MG PO TABS: 325.0000 mg | ORAL_TABLET | Freq: Two times a day (BID) | ORAL | Status: DC

## 2016-05-22 MED ORDER — ALPRAZOLAM 0.25 MG PO TABS: 0.2500 mg | ORAL_TABLET | Freq: Two times a day (BID) | ORAL | Status: DC

## 2016-05-22 MED ORDER — SIMETHICONE 80 MG PO CHEW: 160.0000 mg | CHEWABLE_TABLET | Freq: Three times a day (TID) | ORAL | Status: DC

## 2016-05-22 MED ORDER — ADULT MULTIVITAMIN W/MINERALS CH: 1.0000 | ORAL_TABLET | Freq: Every day | ORAL | Status: DC

## 2016-05-22 MED ORDER — TRAZODONE HCL 50 MG PO TABS: 50.0000 mg | ORAL_TABLET | Freq: Every evening | ORAL | Status: DC | PRN

## 2016-05-22 MED ORDER — OXYCODONE HCL 5 MG PO TABS: 5.0000 mg | ORAL_TABLET | Freq: Four times a day (QID) | ORAL | Status: DC | PRN

## 2016-05-22 MED ORDER — FENTANYL 50 MCG/HR TD PT72: 50.0000 ug | MEDICATED_PATCH | TRANSDERMAL | Status: DC

## 2016-05-22 MED ORDER — PANTOPRAZOLE SODIUM 40 MG PO TBEC: 40.0000 mg | DELAYED_RELEASE_TABLET | Freq: Every day | ORAL | Status: DC

## 2016-05-22 NOTE — Progress Notes (Signed)
Occupational Therapy Session Note  Patient Details  Name: Christina Castaneda MRN: [id number] Date of Birth: 1956-05-15  Today's Date: 05/22/2016 OT Individual Time: 1300-1428 OT Individual Time Calculation (min): 88 min    Skilled Therapeutic Interventions/Progress Updates:    Pt competed emptying her colostomy to begin session while sitting bedside.  She completed task with modified independence but needed setup for container to empty it into secondary to nursing keeping up with output.  Completed toilet transfers to 2 different surfaces during session.  Modified independent for transferring to wide 3:1 over toilet, with use of arm rests to assist with sit to stand.  Also completed transfer to elevated toilet without 3:1 but grab bar on the side.  Increased time with supervision needed to complete this.  Discussed need for wide 3:1 use at home.  Pt states she will put it in a small storage area as it will not fit over the toilet secondary to not being able to push it far enough back with the toilet tank being in the way.  Also showed pt's spouse and pt available rails that can be attached to the toilet itself to assist with sit to stand.  Pt propelled her wheelchair down the hallway greater than 100 ft with 3 rest breaks needed.  Transitioned to the gym with sitting on therapy mat for UE exercises.  Applied 1lb wrist weights to each arm while working on catching and tossing a beach ball.  Emphasis on shoulder flexion movements for catching and tossing the ball.  Min instructional cueing for pt to avoid leaning posteriorly to compensate when having to reach for a higher catch.  Finished with utilization of 2 lb weight bar in addition to 1lb hand weights and had pt hit beach ball back using both UEs while holding bar for 10-15 repetitions.  Therapist rolled pt back to the room where she transferred back in to the bed with modified independence. Call button and phone placed in reach.    Therapy  Documentation Precautions:  Precautions Precautions: None Precaution Comments: ostomy Required Braces or Orthoses: Other Brace/Splint Other Brace/Splint: left knee support brace Restrictions Weight Bearing Restrictions: No  Pain: Pain Assessment Pain Assessment: 0-10 Pain Score: 4  Pain Type: Acute pain Pain Location: Abdomen Pain Descriptors / Indicators: Discomfort Pain Onset: Awakened from sleep Pain Intervention(s): Medication (See eMAR) ADL: ADL ADL Comments: refer to functional navigator  See Function Navigator for Current Functional Status.   Therapy/Group: Individual Therapy  Mavin Dyke OTR/L 05/22/2016, 3:49 PM

## 2016-05-22 NOTE — Progress Notes (Addendum)
[facility]IC[facility]IT[facility]     PROGRESS NOTE    Subjective/Complaints: Feeling well. Pleased with progress. Wants to go home early Husband will be learning dressing changes  ROS: Pt denies fever, rash/itching, headache, blurred or double vision, nausea, vomiting,   diarrhea, chest pain, shortness of breath, palpitations, dysuria, dizziness, neck or back pain,     Objective: Vital Signs: Blood pressure 136/54, pulse 56, temperature 97.8 F (36.6 C), temperature source Oral, resp. rate 18, height 5\' 7"  (1.702 m), weight 124.739 kg (275 lb), last menstrual period 11/18/2012, SpO2 98 %. No results found. No results for input(s): WBC, HGB, HCT, PLT in the last 72 hours. No results for input(s): NA, K, CL, GLUCOSE, BUN, CREATININE, CALCIUM in the last 72 hours.  Invalid input(s): CO CBG (last 3)  No results for input(s): GLUCAP in the last 72 hours.  Wt Readings from Last 3 Encounters:  05/22/16 124.739 kg (275 lb)  05/02/16 141 kg (310 lb 13.6 oz)  01/07/14 138.347 kg (305 lb)    Physical Exam:  Constitutional: She is oriented to person, place, and time. She appears well-developed and well-nourished.  Morbidly obese female  HENT: ear wax Head: Normocephalic and atraumatic.  Eyes: Conjunctivae and EOM are normal. Pupils are equal, round, and reactive to light.  Neck:  Trach site closed.   Cardiovascular: Normal rate and regular rhythm.  No murmur heard. Respiratory: Effort normal. No stridor. She has decreased breath sounds in the right lower field and the left lower field. She has wheezes.  GI: Soft. Bowel sounds are normal. She exhibits no distension. There is tenderness.  Ostomy with  liquidy stool. area dry surrounding Musculoskeletal: She exhibits edema. Crepitus with left knee ROM.  Valgus deformity Neurological: She is alert and oriented to person, place, and time.  Motor:  Bilateral upper extremities 4+/5 proximal to distal Bilateral lower  extremities hip flexion, knee extension 4-/5, ankle dorsi/plantarflexion 4+/5 Sensation diminished to light touch bilateral feet  Skin: Skin is warm and dry.    Psychiatric: Her speech is normal. Her affect is appropriate at the moment     Assessment/Plan: 1. Debility secondary to weakness from multiple medical issues/respiratory failure which require 3+ hours per day of interdisciplinary therapy in a comprehensive inpatient rehab setting. Physiatrist is providing close team supervision and 24 hour management of active medical problems listed below. Physiatrist and rehab team continue to assess barriers to discharge/monitor patient progress toward functional and medical goals.  Function:  Bathing Bathing position   Position: Wheelchair/chair at sink  Bathing parts Body parts bathed by patient: Right arm, Left arm, Chest, Right upper leg, Left upper leg, Abdomen, Front perineal area, Right lower leg, Left lower leg Body parts bathed by helper: Back  Bathing assist Assist Level: Supervision or verbal cues      Upper Body Dressing/Undressing Upper body dressing   What is the patient wearing?: Pull over shirt/dress     Pull over shirt/dress - Perfomed by patient: Thread/unthread right sleeve, Thread/unthread left sleeve, Put head through opening Pull over shirt/dress - Perfomed by helper: Pull shirt over trunk Button up shirt - Perfomed by patient: Thread/unthread right sleeve, Thread/unthread left sleeve, Pull shirt around back, Button/unbutton shirt Button up shirt - Perfomed by helper: Pull shirt around back    Upper body assist Assist Level: Set up   Set up : To obtain clothing/put away  Lower Body Dressing/Undressing Lower body dressing Lower body dressing/undressing activity did not occur: Refused What is  the patient wearing?: Non-skid slipper socks, Underwear Underwear - Performed by patient: Thread/unthread right underwear leg, Thread/unthread left underwear leg, Pull  underwear up/down   Pants- Performed by patient: Thread/unthread right pants leg, Thread/unthread left pants leg Pants- Performed by helper: Pull pants up/down Non-skid slipper socks- Performed by patient: Don/doff right sock, Don/doff left sock Non-skid slipper socks- Performed by helper: Don/doff right sock, Don/doff left sock                  Lower body assist Assist for lower body dressing: Touching or steadying assistance (Pt > 75%)      Toileting Toileting Toileting activity did not occur: Refused Toileting steps completed by patient: Adjust clothing prior to toileting, Performs perineal hygiene, Adjust clothing after toileting Toileting steps completed by helper: Adjust clothing prior to toileting, Performs perineal hygiene, Adjust clothing after toileting Toileting Assistive Devices: Grab bar or rail  Toileting assist Assist level: Supervision or verbal cues   Transfers Chair/bed transfer   Chair/bed transfer method: Ambulatory Chair/bed transfer assist level: Supervision or verbal cues Chair/bed transfer assistive device: Walker, Marine scientist lift: Ecologist Ambulation activity did not occur: Safety/medical concerns   Max distance: 110 ft (total, 4 seated rest breaks) Assist level: Supervision or verbal cues   Wheelchair   Type: Manual Max wheelchair distance: 100 Assist Level: Dependent (Pt equals 0%)  Cognition Comprehension Comprehension assist level: Follows complex conversation/direction with extra time/assistive device  Expression Expression assist level: Expresses complex ideas: With extra time/assistive device  Social Interaction Social Interaction assist level: Interacts appropriately with others with medication or extra time (anti-anxiety, antidepressant).  Problem Solving Problem solving assist level: Solves complex problems: With extra time  Memory Memory assist level: Recognizes or recalls 90% of the time/requires cueing <  10% of the time   Medical Problem List and Plan: 1. Weakness, decreased endurance secondary to debility from multiple medical issues  -continue CIR-PT, OT  -team conference today. Pt would like to go home early!! Will d/w team   2. DVT Prophylaxis/Anticoagulation: Pharmaceutical: Lovenox  -remains indicated until dc  -dopplers negative 3. Pain Management: continue Fentanyl patch 100 mcg with oxycodone prn for pain, was reduced to 83mcg beginning tomorrow  -can wean further as outpt  -states that pain is predominantly abdominal   -likely peripheral neuropathy with stocking glove sensory loss  -pt received a hinged left knee brace- to help with knee stability/mobility/confidence. 4. Mood: LCSW to follow for evaluation and support.   -continued scheduled low dose xanax to assist with anxiety  5. Neuropsych: This patient is capable of making decisions on her own behalf. 6. Skin/Wound Care:    -ostomy intact.  -husband education re: wound  -pt to be more involved with ostomy care, patient states that her husband will be changing the ostomy at home and she will be cleaning the area   -nursing to follow up 7. Fluids/Electrolytes/Nutrition: Monitor I/O.   Renal status stable. Add supplements to help manage low protein stores. 8. ABLA: H/H improving.---9.4 most recently - iron deficiency anemia  -B12 normal.   -Fe++ supp CBC Latest Ref Rng 05/18/2016 05/07/2016 05/03/2016  WBC 4.0 - 10.5 K/uL 8.7 6.2 7.0  Hemoglobin 12.0 - 15.0 g/dL 9.4(L) 7.6(L) 7.5(L)  Hematocrit 36.0 - 46.0 % 32.4(L) 25.9(L) 24.6(L)  Platelets 150 - 400 K/uL 398 232 264       9. COPD/OSA in setting of morbid obesity:  Continue brovana bid and Pulmicort bid. Still with occasional wheeze,add  incentive spirometry  -s/p trach 10. Hypothyroid: On supplement.  11. Morbid Obesity  Diet and exercise education. Pt was highly sedentary PTA 12. Tobacco abuse: Continue counseling 13. Bilateral  knee pain left knee brace as above 14. Hypothyroidism Continue medications  -TSH elevated by free T4 normal/high--no changes---recheck as outpt     LOS (Days) 20 A FACE TO FACE EVALUATION WAS PERFORMED  Kamarii Buren T 05/22/2016 8:24 AM

## 2016-05-22 NOTE — Progress Notes (Signed)
Physical Therapy Session Note  Patient Details  Name: Christina Castaneda MRN: [id number] Date of Birth: 1956/05/14  Today's Date: 05/22/2016 PT Individual Time: 0905-1000 PT Individual Time Calculation (min): 55 min   Short Term Goals: Week 3:  PT Short Term Goal 1 (Week 3): =LTGs due to ELOS  Skilled Therapeutic Interventions/Progress Updates:   Patient in bed upon arrival, donned socks total A and patient sat EOB with HOB flat and use of rail with mod I, donned L knee brace sitting EOB, and transferred sit > stand with supervision using walker. Patient ambulated to/from bathroom using standard walker and performed toileting tasks and hand hygiene standing at sink with mod I. Patient propelled wheelchair using BUE x 75 ft in controlled environment with supervision and more than reasonable time for strengthening and endurance. Performed simulated car transfer to sedan height with low seat using walker with supervision. Stair training up/down 4 (6") stairs using 2 rails with supervision and step-to pattern. Discussed that patient has only one rail for home entry, patient agreeable to attempting with L rail ascending at next attempt. Gait training using standard walker 2 x 30 ft with supervision. Patient left sitting in wheelchair with call bell in reach to await OT session.   Therapy Documentation Precautions:  Precautions Precautions: Fall, Other (comment) Precaution Comments: room air Required Braces or Orthoses: Other Brace/Splint Other Brace/Splint: abdominal binder loosely over abdominal wound and ostomy site Restrictions Weight Bearing Restrictions: No Pain: Pain Assessment Pain Assessment: 0-10 Pain Score: 0-No pain  See Function Navigator for Current Functional Status.   Therapy/Group: Individual Therapy  Laretta Alstrom 05/22/2016, 9:47 AM

## 2016-05-22 NOTE — Progress Notes (Signed)
Physical Therapy Discharge Summary  Patient Details  Name: Christina Castaneda MRN: [id number] Date of Birth: [birth year]  Today's Date: 05/22/2016 PT Individual Time: 1540-1610 PT Individual Time Calculation (min): 30 min    Patient has met 9 of 9 long term goals due to improved activity tolerance, improved balance, improved postural control, increased strength, decreased pain, ability to compensate for deficits and functional use of  right upper extremity, right lower extremity, left upper extremity and left lower extremity.  Patient to discharge at an ambulatory level Supervision.   Patient's care partner is independent to provide the necessary physical assistance at discharge.  Reasons goals not met: NA  Recommendation:  Patient will benefit from ongoing skilled PT services in home health setting to continue to advance safe functional mobility, address ongoing impairments in strength, endurance, balance, and minimize fall risk.  Equipment: No equipment provided-patient owns standard walker  Reasons for discharge: treatment goals met and discharge from hospital  Patient/family agrees with progress made and goals achieved: Yes  Skilled Therapeutic Intervention Patient asleep upon arrival, required increased time for alertness to come to EOB with mod I. Patient ambulated to/from bathroom using standard walker for toileting tasks and hand hygiene standing at sink with mod I. Gait training in controlled and uneven surfaces using standard walker x 50 ft with distant supervision. Patient retrieved item from floor with supervision. Patient with no further questions/concerns regarding discharge home planned tomorrow. Patient returned to bed and left semi reclined with all needs in reach.    PT Discharge Precautions/Restrictions Precautions Precautions: None Precaution Comments: ostomy Restrictions Weight Bearing Restrictions: No Vision/Perception   No change from baseline  Cognition Overall  Cognitive Status: Within Functional Limits for tasks assessed Arousal/Alertness: Awake/alert Orientation Level: Oriented X4 Memory: Appears intact Awareness: Appears intact Problem Solving: Appears intact Safety/Judgment: Appears intact Sensation Sensation Light Touch: Impaired Detail Light Touch Impaired Details: Impaired LLE (reports numbness LLE- present PTA) Stereognosis: Appears Intact Hot/Cold: Appears Intact Proprioception: Appears Intact Coordination Gross Motor Movements are Fluid and Coordinated: No Fine Motor Movements are Fluid and Coordinated: Yes Coordination and Movement Description: body habitus and generalized debility limiting Motor  Motor Motor: Within Functional Limits Motor - Discharge Observations: generalized weakness and debility  Mobility Bed Mobility Bed Mobility: Rolling Right;Rolling Left;Supine to Sit;Sit to Supine Rolling Right: 6: Modified independent (Device/Increase time) Rolling Left: 6: Modified independent (Device/Increase time) Supine to Sit: 6: Modified independent (Device/Increase time) Sit to Supine: 6: Modified independent (Device/Increase time) Transfers Transfers: Yes Sit to Stand: 5: Supervision Stand to Sit: 5: Supervision Locomotion  Ambulation Ambulation: Yes Ambulation/Gait Assistance: 5: Supervision Ambulation Distance (Feet): 50 Feet Assistive device: Standard walker Gait Gait: Yes Gait Pattern: Impaired Gait Pattern: Step-through pattern;Decreased stride length;Decreased hip/knee flexion - right;Decreased hip/knee flexion - left;Trunk flexed Gait velocity: decreased Stairs / Additional Locomotion Stairs: Yes Stairs Assistance: 5: Supervision Stair Management Technique: Two rails;Step to pattern;Forwards Number of Stairs: 4 Height of Stairs: 6 Architect: Yes Wheelchair Assistance: 5: Careers information officer: Both upper extremities Wheelchair Parts Management: Needs  assistance Distance: 75 ft for strengthening  Trunk/Postural Assessment  Cervical Assessment Cervical Assessment: Within Functional Limits Thoracic Assessment Thoracic Assessment: Exceptions to [facility] (slumped posture) Lumbar Assessment Lumbar Assessment: Exceptions to [facility] (posterior pelvic tilt) Postural Control Postural Control: Within Functional Limits  Balance Static Sitting Balance Static Sitting - Level of Assistance: 7: Independent Dynamic Sitting Balance Dynamic Sitting - Level of Assistance: 6: Modified independent (Device/Increase time) Static Standing Balance Static Standing -  Level of Assistance: 6: Modified independent (Device/Increase time) Dynamic Standing Balance Dynamic Standing - Level of Assistance: 6: Modified independent (Device/Increase time) Extremity Assessment  RUE Assessment RUE Assessment: Within Functional Limits LUE Assessment LUE Assessment: Within Functional Limits RLE Assessment RLE Assessment: Within Functional Limits LLE Assessment LLE Assessment: Within Functional Limits   See Function Navigator for Current Functional Status.  Carney Living A 05/22/2016, 2:55 PM

## 2016-05-22 NOTE — Progress Notes (Signed)
Occupational Therapy Session Note  Patient Details  Name: Christina Castaneda MRN: [id number] Date of Birth: [birth year]  Today's Date: 05/22/2016 OT Individual Time: 1000-1100 OT Individual Time Calculation (min): 60 min    Short Term Goals: Week 3:  OT Short Term Goal 1 (Week 3): STG=LTG due to LOS  Skilled Therapeutic Interventions/Progress Updates:    Pt seen for OT ADL bathing/dressing session, pt's husband present for session. Pt sitting up in w/c upon arrival, agreeable to tx session. She completed bathing/dressing seated at sink, completing sit <> stands with supervision. Functional transfers completed with supervision, stand pivots completed without use of AD. She sat with foot supported on chair placed in front of her in order to reach to don lotion and socks to feet/ LH sponge used for LB bathing.  In ADL apartment, completed simulated tub/shower transfer using tub transfer bench. Completed at supervision level with VCs provided for technique. Pt able to manage B LEs over tub wall. Educated pt's husband regarding proper set-up of tub transfer bench for home use, he voiced understanding. Discussed home bathroom layout and idea for use of 3-1 BSC over toilet in order increase ease and independence with toilet transfers. Pt returned to room at end of session, all needs in reach. Pt voiced desire to move d/c date up as she has progressed very quickly over the last week. Will discuss with remainder of tx team during team conference today.   Therapy Documentation Precautions:  Precautions Precautions: Fall, Other (comment) Precaution Comments: room air Required Braces or Orthoses: Other Brace/Splint Other Brace/Splint: abdominal binder loosely over abdominal wound and ostomy site Restrictions Weight Bearing Restrictions: No Pain: Pain Assessment Pain Assessment: 0-10 Pain Score: 0-No pain Pain Type: Acute pain  ADL: ADL ADL Comments: refer to functional navigator  See Function  Navigator for Current Functional Status.   Therapy/Group: Individual Therapy  Lewis, Allesandra Huebsch C 05/22/2016, 7:14 AM

## 2016-05-23 MED ORDER — SENNOSIDES-DOCUSATE SODIUM 8.6-50 MG PO TABS: 2.0000 | ORAL_TABLET | Freq: Three times a day (TID) | ORAL | Status: DC

## 2016-05-23 NOTE — Progress Notes (Signed)
[place] PHYSICAL MEDICINE & REHABILITATION     PROGRESS NOTE    Subjective/Complaints: Pt pleased with progress. Excited to go home!  ROS: Pt denies fever, rash/itching, headache, blurred or double vision, nausea, vomiting,   diarrhea, chest pain, shortness of breath, palpitations, dysuria, dizziness, neck or back pain,     Objective: Vital Signs: Blood pressure 151/48, pulse 58, temperature 97.7 F (36.5 C), temperature source Oral, resp. rate 18, height 5\' 7"  (1.702 m), weight 125.193 kg (276 lb), last menstrual period 11/18/2012, SpO2 100 %. No results found. No results for input(s): WBC, HGB, HCT, PLT in the last 72 hours. No results for input(s): NA, K, CL, GLUCOSE, BUN, CREATININE, CALCIUM in the last 72 hours.  Invalid input(s): CO CBG (last 3)  No results for input(s): GLUCAP in the last 72 hours.  Wt Readings from Last 3 Encounters:  05/23/16 125.193 kg (276 lb)  05/02/16 141 kg (310 lb 13.6 oz)  01/07/14 138.347 kg (305 lb)    Physical Exam:  Constitutional: She is oriented to person, place, and time. She appears well-developed and well-nourished.  Morbidly obese female  HENT: ear wax Head: Normocephalic and atraumatic.  Eyes: Conjunctivae and EOM are normal. Pupils are equal, round, and reactive to light.  Neck:  Trach site closed.   Cardiovascular: Normal rate and regular rhythm.  No murmur heard. Respiratory: Effort normal. No stridor. She has decreased breath sounds in the right lower field and the left lower field. She has wheezes.  GI: Soft. Bowel sounds are normal. She exhibits no distension. There is tenderness.  Ostomy with formed stool. area clean surrounding Musculoskeletal: She exhibits decreased edema. Crepitus with left knee ROM.  Valgus deformity Neurological: She is alert and oriented to person, place, and time.  Motor:  Bilateral upper extremities 4+/5 proximal to distal Bilateral lower extremities hip flexion, knee extension 4-/5,  ankle dorsi/plantarflexion 4+/5 Sensation diminished to light touch bilateral feet  Skin: Skin is warm and dry.    Psychiatric: Her speech is normal. Her affect is appropriate at the moment     Assessment/Plan: 1. Debility secondary to weakness from multiple medical issues/respiratory failure which require 3+ hours per day of interdisciplinary therapy in a comprehensive inpatient rehab setting. Physiatrist is providing close team supervision and 24 hour management of active medical problems listed below. Physiatrist and rehab team continue to assess barriers to discharge/monitor patient progress toward functional and medical goals.  Function:  Bathing Bathing position   Position: Wheelchair/chair at sink  Bathing parts Body parts bathed by patient: Right arm, Left arm, Chest, Right upper leg, Left upper leg, Abdomen, Right lower leg, Left lower leg, Front perineal area Body parts bathed by helper: Back  Bathing assist Assist Level: Supervision or verbal cues (LH sponge)      Upper Body Dressing/Undressing Upper body dressing   What is the patient wearing?: Button up shirt     Pull over shirt/dress - Perfomed by patient: Thread/unthread right sleeve, Thread/unthread left sleeve, Put head through opening Pull over shirt/dress - Perfomed by helper: Pull shirt over trunk Button up shirt - Perfomed by patient: Thread/unthread right sleeve, Thread/unthread left sleeve, Pull shirt around back, Button/unbutton shirt Button up shirt - Perfomed by helper: Pull shirt around back    Upper body assist Assist Level: Set up   Set up : To obtain clothing/put away  Lower Body Dressing/Undressing Lower body dressing Lower body dressing/undressing activity did not occur: Refused What is the patient wearing?: Non-skid slipper socks,  Underwear Underwear - Performed by patient: Thread/unthread right underwear leg, Thread/unthread left underwear leg, Pull underwear up/down   Pants- Performed by  patient: Thread/unthread right pants leg, Thread/unthread left pants leg, Pull pants up/down Pants- Performed by helper: Pull pants up/down Non-skid slipper socks- Performed by patient: Don/doff right sock, Don/doff left sock Non-skid slipper socks- Performed by helper: Don/doff right sock, Don/doff left sock                  Lower body assist Assist for lower body dressing: Supervision or verbal cues      Toileting Toileting Toileting activity did not occur: Refused Toileting steps completed by patient: Adjust clothing prior to toileting, Performs perineal hygiene, Adjust clothing after toileting Toileting steps completed by helper: Adjust clothing prior to toileting, Performs perineal hygiene, Adjust clothing after toileting Toileting Assistive Devices: Grab bar or rail  Toileting assist Assist level: More than reasonable time   Transfers Chair/bed transfer   Chair/bed transfer method: Ambulatory Chair/bed transfer assist level: Supervision or verbal cues Chair/bed transfer assistive device: Environmental consultant, Marine scientist lift: Ecologist Ambulation activity did not occur: Safety/medical concerns   Max distance: 50 Assist level: Supervision or verbal cues   Wheelchair   Type: Manual Max wheelchair distance: 75 Assist Level: Supervision or verbal cues  Cognition Comprehension Comprehension assist level: Follows complex conversation/direction with no assist  Expression Expression assist level: Expresses complex ideas: With no assist  Social Interaction Social Interaction assist level: Interacts appropriately with others with medication or extra time (anti-anxiety, antidepressant).  Problem Solving Problem solving assist level: Solves complex problems: With extra time  Memory Memory assist level: Recognizes or recalls 90% of the time/requires cueing < 10% of the time   Medical Problem List and Plan: 1. Weakness, decreased endurance secondary to debility  from multiple medical issues  -home health follow up  -home today. Husband to receive education regarding dressing  -Patient to see me in the office for transitional care encounter in 1-2 weeks.   2. DVT Prophylaxis/Anticoagulation: Pharmaceutical: Lovenox  -dc today  -dopplers negative 3. Pain Management:continue 43mcg fentanyl patch at home  -can wean further as outpt  -states that pain is predominantly abdominal   -likely peripheral neuropathy with stocking glove sensory loss  -pt received a hinged left knee brace- to help with knee stability/mobility/confidence. 4. Mood: LCSW to follow for evaluation and support.   -continued scheduled low dose xanax to assist with anxiety  5. Neuropsych: This patient is capable of making decisions on her own behalf. 6. Skin/Wound Care:    -ostomy intact.  -husband education re: wound  -pt to be more involved with ostomy care, patient states that her husband will be changing the ostomy at home and she will be cleaning the area   -nursing to follow up 7. Fluids/Electrolytes/Nutrition: Monitor I/O.   Renal status stable. Add supplements to help manage low protein stores. 8. ABLA: H/H improving.---9.4 most recently - iron deficiency anemia  -B12 normal.   -Fe++ supp CBC Latest Ref Rng 05/18/2016 05/07/2016 05/03/2016  WBC 4.0 - 10.5 K/uL 8.7 6.2 7.0  Hemoglobin 12.0 - 15.0 g/dL 9.4(L) 7.6(L) 7.5(L)  Hematocrit 36.0 - 46.0 % 32.4(L) 25.9(L) 24.6(L)  Platelets 150 - 400 K/uL 398 232 264       9. COPD/OSA in setting of morbid obesity:  Continue brovana bid and Pulmicort bid. Still with occasional wheeze,add incentive spirometry  -s/p trach 10. Hypothyroid: On supplement.  11. Morbid Obesity  Diet and  exercise education. Pt was highly sedentary PTA 12. Tobacco abuse: Continue counseling 13. Bilateral knee pain left knee brace as above 14. Hypothyroidism Continue medications  -TSH elevated  by free T4 normal/high--no changes---recheck as outpt     LOS (Days) 21 A FACE TO FACE EVALUATION WAS PERFORMED  SWARTZ,ZACHARY T 05/23/2016 8:43 AM

## 2016-05-23 NOTE — Discharge Instructions (Signed)
Inpatient Rehab Discharge Instructions  YARAH RIMA Discharge date and time: 05/23/16   Activities/Precautions/ Functional Status: Activity: no lifting, driving, or strenuous exercise for till cleared by MD Diet: low fat, low cholesterol diet Wound Care:  Cleanse area with normal saline. Apply damp to dry dressing to abdominal wounds. Change twice a day.   Functional status:  ___ No restrictions     ___ Walk up steps independently ___ 24/7 supervision/assistance   ___ Walk up steps with assistance ___ Intermittent supervision/assistance  ___ Bathe/dress independently ___ Walk with walker     ___ Bathe/dress with assistance ___ Walk Independently    ___ Shower independently ___ Walk with assistance    ___ Shower with assistance ___ No alcohol     ___ Return to work/school ________   COMMUNITY REFERRALS UPON DISCHARGE:    Home Health:   PT     OT     RN                         Agency:  [facility]       Phone: [phone]   Medical Equipment/Items Ordered: tub bench                                                     Agency/Supplier:  [facility] @ [phone]       Special Instructions:    My questions have been answered and I understand these instructions. I will adhere to these goals and the provided educational materials after my discharge from the hospital.  Patient/Caregiver Signature _______________________________ Date __________  Clinician Signature _______________________________________ Date __________  Please bring this form and your medication list with you to all your follow-up doctor's appointments.

## 2016-05-23 NOTE — Progress Notes (Signed)
Occupational Therapy Discharge Summary  Patient Details  Name: Christina Castaneda MRN: [id number] Date of Birth: May 29, 1956   Patient has met 57 of 60 long term goals due to improved activity tolerance and improved balance.  Patient to discharge at overall Supervision level.  Patient's care partner is independent to provide the necessary physical assistance at discharge.     Recommendation:  Patient will benefit from ongoing skilled OT services in home health setting to continue to advance functional skills in the area of BADL and iADL.  Equipment: Tub transfer bench. Pt already has BSC  Reasons for discharge: treatment goals met and discharge from hospital  Patient/family agrees with progress made and goals achieved: Yes  OT Discharge Precautions/Restrictions  Precautions Precautions: None Precaution Comments: ostomy Required Braces or Orthoses: Other Brace/Splint Other Brace/Splint: left knee support brace Restrictions Weight Bearing Restrictions: No ADL ADL ADL Comments: refer to functional navigator Vision/Perception  Vision- History Baseline Vision/History: Wears glasses Wears Glasses: At all times Patient Visual Report: No change from baseline  Cognition Overall Cognitive Status: Within Functional Limits for tasks assessed Arousal/Alertness: Awake/alert Orientation Level: Oriented X4 Memory: Appears intact Awareness: Appears intact Problem Solving: Appears intact Safety/Judgment: Appears intact Sensation Sensation Light Touch: Impaired Detail Light Touch Impaired Details: Impaired LLE (Reports numbness in L LE PTA) Stereognosis: Appears Intact Hot/Cold: Appears Intact Proprioception: Appears Intact Coordination Gross Motor Movements are Fluid and Coordinated: No Fine Motor Movements are Fluid and Coordinated: Yes Coordination and Movement Description: body habitus and generalized debility limiting Motor  Motor Motor: Within Functional Limits Motor -  Discharge Observations: generalized weakness and debility, however, much improved since admission Trunk/Postural Assessment  Cervical Assessment Cervical Assessment: Within Functional Limits Thoracic Assessment Thoracic Assessment: Exceptions to [facility] (Flexed posture) Lumbar Assessment Lumbar Assessment: Exceptions to [facility] (Posterior pelvic) Postural Control Postural Control: Within Functional Limits  Balance Balance Balance Assessed: Yes Static Sitting Balance Static Sitting - Level of Assistance: 7: Independent Dynamic Sitting Balance Dynamic Sitting - Level of Assistance: 6: Modified independent (Device/Increase time) Static Standing Balance Static Standing - Level of Assistance: 6: Modified independent (Device/Increase time) Dynamic Standing Balance Dynamic Standing - Balance Support: During functional activity;Right upper extremity supported;Left upper extremity supported Dynamic Standing - Level of Assistance: 6: Modified independent (Device/Increase time) Dynamic Standing - Comments: UE support required due to weakness/ fatigue; standing to complete grooming tasks Extremity/Trunk Assessment RUE Assessment RUE Assessment: Within Functional Limits LUE Assessment LUE Assessment: Within Functional Limits   See Function Navigator for Current Functional Status.  Lewis, Tyge Somers C 05/23/2016, 3:30 PM

## 2016-05-23 NOTE — Consult Note (Signed)
WOC ostomy follow up Stoma type/location: LLQ, end colostomy Stomal assessment/size: husband re measured stoma with WOC assistance today.  1 3/8" x 1 3/4" oval shaped, budded, pink, moist Peristomal assessment: intact  Treatment options for stomal/peristomal skin: using 2" barrier ring to aid in seal Output formed brown stool Ostomy pouching: 1pc.with a filter and peakaboo opening.  2" barrier ring Education provided:   Patient's husband and patient performed all aspects of pouch change. Removed old pouch. Measured stoma, cut new pouch opening to fit, placed barrier ring around the stoma. Placed new pouch and closed lock and roll closure.  Explained how to order supplies needed.  [employer] item number [phone] (2" barrier ring) and [id number] (1pc flat with filter and beige cover).  HHRN to continue to support patient at home. All questions answered.  Explained restrictions for shower and other care will be directed by surgical team.    Enrolled patient in [facility] Discharge program: Yes, requested additional 1pc with a filter to be sent.   Re consult if needed, will not follow at this time. Thanks  Raoul Ciano [place], [place] [phone])

## 2016-05-23 NOTE — Progress Notes (Signed)
Social Work  Discharge Note  The overall goal for the admission was met for:   Discharge location: Yes - home with spouse who has arranged 24/7 supervision  Length of Stay: Yes - 21 days  Discharge activity level: Yes - supervision  Home/community participation: Yes - supervision  Services provided included: MD, RD, PT, OT, RN, TR, Pharmacy, Neuropsych and SW  Financial Services: Private Insurance: [place]  Follow-up services arranged: Home Health: RN, PT, OT via [facility], DME: tub bench via [place] and Patient/Family has no preference for HH/DME agencies  Comments (or additional information):  Patient/Family verbalized understanding of follow-up arrangements: Yes  Individual responsible for coordination of the follow-up plan: pt  Confirmed correct DME delivered: Christina Castaneda 05/23/2016    Kristene Liberati

## 2016-05-23 NOTE — Progress Notes (Signed)
Patient discharged. Accompanied off unit by NT and spouse. All belongings accounted for. Dressings changed this morning by Reesa Chew; [place] nurse provided education on ostomy. Patient has no questions upon discharge.

## 2016-05-23 NOTE — Patient Care Conference (Signed)
Inpatient RehabilitationTeam Conference and Plan of Care Update Date: 05/22/2016   Time: 2:00 PM    Patient Name: Christina Castaneda      Medical Record Number: [id number]  Date of Birth: [birth year] Sex: Female         Room/Bed: 4W02C/4W02C-01 Payor Info: Payor: [place] / Plan: BCBS OTHER / Product Type: *No Product type* /    Admitting Diagnosis: Debility  Admit Date/Time:  05/02/2016  7:06 PM Admission Comments: No comment available   Primary Diagnosis:  Debility Principal Problem: Debility  Patient Active Problem List   Diagnosis Date Noted  . Adjustment disorder with mixed anxiety and depressed mood   . Debility 05/02/2016  . Edema   . Generalized weakness   . Chronic pain syndrome   . Hypoalbuminemia due to protein-calorie malnutrition ([place])   . OSA (obstructive sleep apnea)   . Morbid obesity due to excess calories ([place])   . S/P colostomy ([place])   . Tracheostomy status ([place])   . Tracheostomy care ([place])   . Chronic obstructive pulmonary disease ([place])   . Tobacco abuse   . Essential hypertension   . Bilateral knee pain   . Dysphagia   . Acute blood loss anemia   . Hyponatremia   . Thyroid activity decreased   . Acute respiratory failure ([place])   . Pressure ulcer 04/01/2016  . 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 Ventilator dependence ([place]) post exp lap with open abd 03/29/2016  . Hypothyroid 03/29/2016  . Morbid obesity ([place]) 03/29/2016  . Open wound of abdomen 03/29/2016  . Infection of colostomy stoma ([place]) 03/28/2016  . 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 Obesity 11/12/2012    Expected Discharge Date: Expected Discharge Date: 05/23/16  Team Members Present: Physician leading conference: Dr. Alger Simons Social Worker Present: Lennart Pall, LCSW Nurse Present: Dorien Chihuahua, RN PT Present: Jorge Mandril, PT OT Present: Napoleon Form, OT SLP Present: Weston Anna, SLP PPS Coordinator present : Daiva Nakayama, RN, CRRN     Current  Status/Progress Goal Weekly Team Focus  Medical   wound slowly healing. activity tolerance improving. ostomy functional  improve activity tolerance  wound care/education, finalize medical issues for dc   Bowel/Bladder   colostomy- soft/loose stools 5/29, continent of urine  continent, soft stools  continue to monitor   Swallow/Nutrition/ Hydration             ADL's   Supervision functional transfers; set-up bathing/dressing  Supervision- mod I Overall  d/c planning, family ed, activity tolerance   Mobility   supervision up to 50 ft using SW  supervision  endurance, strengthening, mobility, balance, pt/family education   Communication             Safety/Cognition/ Behavioral Observations            Pain   pain is less, only requires BID oxy 5-10 mg  <2  monitor & offer pain meds PRN   Skin   abdominal incisions with dressing changes, colostomy, abd folds, under breasts treatments PRN  continue to monitor       Rehab Goals Patient on target to meet rehab goals: Yes *See Care Plan and progress notes for long and short-term goals.  Barriers to Discharge: pain/wound care/ostomy    Possible Resolutions to Barriers:  continued education, improve strength/confidence    Discharge Planning/Teaching Needs:  Home with husband who is arranging 24/7 supervision  completing final education for spouse with WOC tomorrow   Team Discussion:  Reaching  supervision goals and has made excellent progress this week.  Team feels could d/c tomorrow if all [place] education completed.  SW to follow up.  Revisions to Treatment Plan:  Rec changed in d/c to 5/31   Continued Need for Acute Rehabilitation Level of Care: The patient requires daily medical management by a physician with specialized training in physical medicine and rehabilitation for the following conditions: Daily direction of a multidisciplinary physical rehabilitation program to ensure safe treatment while eliciting the highest outcome  that is of practical value to the patient.: Yes Daily medical management of patient stability for increased activity during participation in an intensive rehabilitation regime.: Yes Daily analysis of laboratory values and/or radiology reports with any subsequent need for medication adjustment of medical intervention for : Wound care problems;Cardiac problems;Nutritional problems;Post surgical problems  Andrew Blasius 05/23/2016, 2:06 PM

## 2016-05-23 NOTE — Discharge Summary (Signed)
Physician Discharge Summary  Patient ID: Christina Castaneda MRN: [id number] DOB/AGE: 03-24-1956 60 y.o.  Admit date: 05/02/2016 Discharge date: 05/23/2016  Discharge Diagnoses:  Principal Problem:   Debility Active Problems:   Colocutaneous fistula   Tracheostomy status (HCC)   Edema   Generalized weakness   Chronic pain syndrome   Hypoalbuminemia due to protein-calorie malnutrition (HCC)   OSA (obstructive sleep apnea)   Morbid obesity due to excess calories (HCC)   S/P colostomy ([place])   Adjustment disorder with mixed anxiety and depressed mood   Discharged Condition: Stable   Significant Diagnostic Studies: No results found.  Labs:  Basic Metabolic Panel:  Recent Labs Lab 05/18/16 0935  NA 132*  K 4.3  CL 98*  CO2 25  GLUCOSE 88  BUN 10  CREATININE 0.99  CALCIUM 9.1    CBC:  Recent Labs Lab 05/18/16 0935  WBC 8.7  NEUTROABS 4.3  HGB 9.4*  HCT 32.4*  MCV 91.0  PLT 398    CBG: No results for input(s): GLUCAP in the last 168 hours.   Today's Vitals   05/22/16 2031 05/22/16 2212 05/22/16 2313 05/23/16 0441  BP:    151/48  Pulse: 66   58  Temp:    97.7 F (36.5 C)  TempSrc:    Oral  Resp: 18   18  Height:      Weight:    125.193 kg (276 lb)  SpO2: 99%   100%  PainSc:  4  Asleep      Brief HPI:   Christina Castaneda is a 60 year old female with history of COPD with OSA, morbid obesity, vitamin B 12 deficiency, perforated diverticulitis s/p sigmoid colectomy with colostomy in [place], [place] on 03/20 who was discharged to home in [place], [place]. At follow up appointment for staple removal, she was found to have feculent discharge and was admitted via ED on 03/28/16. 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 Hulen Skains on 04/05. Abdomen kept open with wound VAC required multiple procedures including ABRA  device to help with final wound closure on 04/18/16. She continues to have retention sutures as well as evidence of breakdown and wet to dry dressing to prior ostomy site. Septic shock resolved and she required tracheostomy  due to inability to tolerate vent wean and was extubated to ATC. CCM recommended leaving #6 CFS in place until ready for decannulation. Patient noted to be severely deconditioned and CIR was recommended for follow up therapy.    Hospital Course: Christina Castaneda was admitted to rehab 05/02/2016 for inpatient therapies to consist of PT, ST and OT at least three hours five days a week. Past admission physiatrist, therapy team and rehab RN have worked together to provide customized collaborative inpatient rehab. She has had steady improvement in endurance as well as mood. Po intake has improved and she is tolerating 100% of meals. Bloating has been managed with ac/hs simethicone on board. Bowel program has been adjusted to help with consistency of stool and Senna s was increased to tid with patient educated on appropriate diet as well as medication adjustment to keep stools semi liquid consistency.  Pain control has improved and fentanyl was tapered to 50 mcg/hr at discharge.   As activity tolerance improved, PCCM was consulted for input and she was decannulated on 5/19. She has been compliant  with CPAP use since decannulation and no hypoxia reported with activity. Abdominal wound has been healing in and two of retention sutures were removed by CCS. Wound bed and breakdown areas from [place] continue to have yellow eschar but no odor or other signs of infection noted.  CCS was contacted prior to discharge and recommended continuing damp to dry dressing change bid with follow up on outpatient basis. ABLA has been monitored with routine checks and H/H has improved to 9.4/32.4. She is to continue iron supplement bid after discharge. Team has provided ego support and her anxiety was managed with  scheduled Xanax bid.  Her outlook has improved greatly and she requested moving up her discharge by two days.  Left knee brace was ordered to help with pain and stability.  During patient's stay in rehab weekly team conferences were held to monitor patient's progress, set goals and discuss barriers to discharge. At admission, patient required moderate assist +2 for mobility and max assist with basic self care tasks. She has had improvement in activity tolerance and balance. She is able to complete ADL tasks with supervision. She is  Modified independent for transfers and is ambulating 70' with RW and distant supervision. Family education was done with husband regarding wound care, mobility as well as safety. She will continue to receive follow up [place], [place] and [place] by [place] after discharge.    Disposition: Home    Diet: Regular.   Special Instructions: 1. Needs CBC and  TSH rechecked in 2-3 weeks. 2. Damp to dry dressing changes bid. Cover with dry dressing. Contact MD in case of redness, drainage, fever or chills.  3. No driving or strenuous activity.       Discharge Instructions    Ambulatory referral to Physical Medicine Rehab    Complete by:  As directed   1-2 week follow up/debility/moderate complexity            Medication List    STOP taking these medications        hydrochlorothiazide 12.5 MG capsule  Commonly known as:  MICROZIDE     lisinopril 20 MG tablet  Commonly known as:  PRINIVIL,ZESTRIL     nystatin cream  Commonly known as:  MYCOSTATIN      TAKE these medications        ADVAIR DISKUS 250-50 MCG/DOSE Aepb  Generic drug:  Fluticasone-Salmeterol  Inhale 1 puff into the lungs 2 (two) times daily. Once in the am & once at pm     albuterol 108 (90 Base) MCG/ACT inhaler  Commonly known as:  PROVENTIL HFA;VENTOLIN HFA  Inhale 2 puffs into the lungs every 6 (six) hours as needed. Wheezing and shortness of breath     ALPRAZolam 0.25 MG  tablet--Rx #45 pills   Commonly known as:  XANAX  Take 1 tablet (0.25 mg total) by mouth 2 (two) times daily.     ergocalciferol 50000 units capsule  Commonly known as:  VITAMIN D2  Take 50,000 Units by mouth once a week.     fentaNYL 50 MCG/HR--Rx 5 patches  Commonly known as:  DURAGESIC - dosed mcg/hr  Place 1 patch (50 mcg total) onto the skin every 3 (three) days.     ferrous sulfate 325 (65 FE) MG tablet  Take 1 tablet (325 mg total) by mouth 2 (two) times daily with a meal.     levothyroxine 200 MCG tablet  Commonly known as:  SYNTHROID, LEVOTHROID  Take 200 mcg by mouth  daily before breakfast.     multivitamin with minerals Tabs tablet  Take 1 tablet by mouth daily.     oxyCODONE 5 MG immediate release tablet--Rx # 60 pills   Commonly known as:  Oxy IR/ROXICODONE  Take 1 tablet (5 mg total) by mouth every 6 (six) hours as needed for severe pain.     pantoprazole 40 MG tablet  Commonly known as:  PROTONIX  Take 1 tablet (40 mg total) by mouth daily.     senna-docusate 8.6-50 MG tablet  Commonly known as:  Senokot-S  Take 2 tablets by mouth 3 (three) times daily before meals.     simethicone 80 MG chewable tablet  Commonly known as:  MYLICON  Chew 2 tablets (160 mg total) by mouth 4 (four) times daily -  before meals and at bedtime.     traZODone 50 MG tablet  Commonly known as:  DESYREL  Take 1 tablet (50 mg total) by mouth at bedtime as needed for sleep.     ULORIC 80 MG Tabs  Generic drug:  Febuxostat  Take 80 mg by mouth daily before breakfast.       Follow-up Information    Follow up with Meredith Staggers, MD.   Specialty:  Physical Medicine and Rehabilitation   Why:  office will call you for follow up appointment.  Address after June 10th--[address]--[address].  [place], [place]- [address]. Tel# [phone]   Contact information:   [address], [address] [address] [phone]       Follow up with Judeth Horn, MD.    Specialty:  General Surgery   Why:  office to call you with appointment/call if you have not heard from them in 48 hours   Contact information:   [address] [place] [address] [phone]       Follow up with Neysa Hotter B, PA-C On 06/04/2016.   Specialty:  Physician Assistant   Why:  @ 4:00 pm (hospital follow up)   Contact information:   [address] [place] [address] [phone]       Signed: Bary Leriche 05/23/2016, 4:24 PM

## 2016-05-25 ENCOUNTER — Telehealth: Payer: Self-pay

## 2016-05-25 NOTE — Telephone Encounter (Signed)
1. Are you/is patient experiencing any problems since coming home? Are there any questions regarding any aspect of care? No issues.  2. Are there any questions regarding medications administration/dosing? Are meds being taken as prescribed? Patient should review meds with caller to confirm. Meds confirmed.  3. Have there been any falls? No.  4. Has Home Health been to the house and/or have they contacted you? If not, have you tried to contact them? Can we help you contact them? Home health has visited.  5. Are bowels and bladder emptying properly? Are there any unexpected incontinence issues? If applicable, is patient following bowel/bladder programs? No issues with ostomy bag.  6. Any fevers, problems with breathing, unexpected pain? No issues. Moderate pain. Fentanyl patches are providing relief.   7. Are there any skin problems or new areas of breakdown? Blistering on back of legs. Home health RN is aware.  8. Has the patient/family member arranged specialty MD follow up (ie cardiology/neurology/renal/surgical/etc)?  Can we help arrange? Appointments have been made. 9. Does the patient need any other services or support that we can help arrange? No  10. Are caregivers following through as expected in assisting the patient? Yes, husband.  11. Has the patient quit smoking, drinking alcohol, or using drugs as recommended? Pt is not smoking, drinking alcohol or using drugs.   Spoke with pt's husband. He is aware of appointment on 05/28/16 with [facility].

## 2016-05-28 ENCOUNTER — Encounter
Payer: BLUE CROSS/BLUE SHIELD | Attending: Physical Medicine & Rehabilitation | Admitting: Physical Medicine & Rehabilitation

## 2016-05-28 ENCOUNTER — Encounter: Payer: Self-pay | Admitting: Physical Medicine & Rehabilitation

## 2016-05-28 VITALS — BP 151/75 | HR 59 | Resp 16

## 2016-05-28 DIAGNOSIS — F17210 Nicotine dependence, cigarettes, uncomplicated: Secondary | ICD-10-CM | POA: Insufficient documentation

## 2016-05-28 DIAGNOSIS — S31109S Unspecified open wound of abdominal wall, unspecified quadrant without penetration into peritoneal cavity, sequela: Secondary | ICD-10-CM

## 2016-05-28 DIAGNOSIS — I10 Essential (primary) hypertension: Secondary | ICD-10-CM | POA: Diagnosis not present

## 2016-05-28 DIAGNOSIS — M1000 Idiopathic gout, unspecified site: Secondary | ICD-10-CM

## 2016-05-28 DIAGNOSIS — J45909 Unspecified asthma, uncomplicated: Secondary | ICD-10-CM | POA: Diagnosis not present

## 2016-05-28 DIAGNOSIS — G4733 Obstructive sleep apnea (adult) (pediatric): Secondary | ICD-10-CM

## 2016-05-28 DIAGNOSIS — M25561 Pain in right knee: Secondary | ICD-10-CM | POA: Insufficient documentation

## 2016-05-28 DIAGNOSIS — M25562 Pain in left knee: Secondary | ICD-10-CM | POA: Diagnosis not present

## 2016-05-28 DIAGNOSIS — Z933 Colostomy status: Secondary | ICD-10-CM

## 2016-05-28 DIAGNOSIS — R5381 Other malaise: Secondary | ICD-10-CM | POA: Diagnosis not present

## 2016-05-28 DIAGNOSIS — R600 Localized edema: Secondary | ICD-10-CM | POA: Insufficient documentation

## 2016-05-28 DIAGNOSIS — E039 Hypothyroidism, unspecified: Secondary | ICD-10-CM | POA: Diagnosis not present

## 2016-05-28 DIAGNOSIS — M109 Gout, unspecified: Secondary | ICD-10-CM | POA: Diagnosis not present

## 2016-05-28 DIAGNOSIS — J449 Chronic obstructive pulmonary disease, unspecified: Secondary | ICD-10-CM | POA: Insufficient documentation

## 2016-05-28 DIAGNOSIS — E049 Nontoxic goiter, unspecified: Secondary | ICD-10-CM | POA: Diagnosis not present

## 2016-05-28 DIAGNOSIS — D62 Acute posthemorrhagic anemia: Secondary | ICD-10-CM

## 2016-05-28 DIAGNOSIS — D509 Iron deficiency anemia, unspecified: Secondary | ICD-10-CM | POA: Insufficient documentation

## 2016-05-28 DIAGNOSIS — R609 Edema, unspecified: Secondary | ICD-10-CM

## 2016-05-28 DIAGNOSIS — R52 Pain, unspecified: Secondary | ICD-10-CM | POA: Insufficient documentation

## 2016-05-28 DIAGNOSIS — B379 Candidiasis, unspecified: Secondary | ICD-10-CM | POA: Insufficient documentation

## 2016-05-28 MED ORDER — HYDROCHLOROTHIAZIDE 25 MG PO TABS: 25.0000 mg | ORAL_TABLET | Freq: Every day | ORAL | Status: DC

## 2016-05-28 MED ORDER — FENTANYL 25 MCG/HR TD PT72: 25.0000 ug | MEDICATED_PATCH | TRANSDERMAL | Status: DC

## 2016-05-28 MED ORDER — ALLOPURINOL 100 MG PO TABS: 100.0000 mg | ORAL_TABLET | Freq: Every day | ORAL | Status: DC

## 2016-05-28 MED ORDER — OXYCODONE HCL 5 MG PO TABS: 5.0000 mg | ORAL_TABLET | Freq: Four times a day (QID) | ORAL | Status: DC | PRN

## 2016-05-28 NOTE — Patient Instructions (Addendum)
PLEASE CALL ME WITH ANY PROBLEMS OR QUESTIONS [id number]).     TAKE YOUR HCTZ DAILY   I WILL ORDER LABS ON MONDAY

## 2016-05-28 NOTE — Progress Notes (Signed)
Subjective:    Patient ID: Christina Castaneda, female    DOB: 1956/02/19, 60 y.o.   MRN: [id number]  HPI   Christina Castaneda is here for a transitional care visit after her recent admission. She was discharged with [facility] services. She has had evaluations only. Her strength is improving. She is able to walk short dx at home. Her pain levels are gradually improving as well. Her wound is closing up. Her husband is performing wound care. Ostomy is functioning. Christina Castaneda's biggest concern is her lower extremity edema which has increased. She does have HCTZ her nephrologist rx'ed for her to help control edema. She hasn't used it since being in the hospital. She has been sitting up more during down time and just started to elevate her legs while she's sitting.    Pain Inventory Average Pain 2 Pain Right Now 4 My pain is aching  In the last 24 hours, has pain interfered with the following? General activity 3 Relation with others 3 Enjoyment of life 2 What TIME of day is your pain at its worst? daytime Sleep (in general) Fair  Pain is worse with: some activites Pain improves with: rest and medication Relief from Meds: good  Mobility walk with assistance use a walker how many minutes can you walk? 5 ability to climb steps?  yes do you drive?  no Do you have any goals in this area?  yes  Function not employed: date last employed NA I need assistance with the following:  meal prep Do you have any goals in this area?  yes  Neuro/Psych weakness numbness tingling trouble walking dizziness confusion anxiety loss of taste or smell  Prior Studies Any changes since last visit?  no bone scan CT/MRI  Physicians involved in your care Primary care .   Family History  Problem Relation Age of Onset  . COPD Mother   . Diabetes Mother   . Congestive Heart Failure Mother   . Diabetes Father   . Heart attack Father    Social History   Social History  . Marital Status: Married   Spouse Name: N/A  . Number of Children: N/A  . Years of Education: N/A   Social History Main Topics  . Smoking status: Current Every Day Smoker -- 1.00 packs/day for 28 years    Types: Cigarettes  . Smokeless tobacco: Never Used  . Alcohol Use: 0.5 - 1.5 oz/week    1-3 Standard drinks or equivalent per week     Comment: "couple beers on the weekend"  . Drug Use: No  . Sexual Activity:    Partners: Male    Birth Control/ Protection: Surgical     Comment: Hysterectomy   Other Topics Concern  . None   Social History Narrative   Past Surgical History  Procedure Laterality Date  . Neck surgery  1994    repair disk  . Tonsillectomy      as a child  . Cholecystectomy  2009    gallstone removed  . Robotic assisted total hysterectomy with bilateral salpingo oopherectomy  11/18/2012    Procedure: ROBOTIC ASSISTED TOTAL HYSTERECTOMY WITH BILATERAL SALPINGO OOPHORECTOMY;  Surgeon: Imagene Gurney A. Alycia Rossetti, MD;  Location: [facility];  Service: Gynecology;  Laterality: N/A;  . Colostomy revision N/A 03/28/2016    Procedure: COLOSTOMY REVISION;  Surgeon: Judeth Horn, MD;  Location: [facility];  Service: General;  Laterality: N/A;  . Laparotomy N/A 03/28/2016    Procedure: EXPLORATORY LAPAROTOMY;  Surgeon: Judeth Horn, MD;  Location: [facility];  Service: General;  Laterality: N/A;  . Omentectomy N/A 03/28/2016    Procedure: OMENTECTOMY;  Surgeon: Judeth Horn, MD;  Location: [facility] [facility];  Service: General;  Laterality: N/A;  . Wound debridement N/A 03/28/2016    Procedure: DEBRIDEMENT WOUND;  Surgeon: Judeth Horn, MD;  Location: [facility];  Service: General;  Laterality: N/A;  . Vacuum assisted closure change N/A 03/30/2016    Procedure: ABDOMINAL VAC CHANGE;  Surgeon: Rolm Bookbinder, MD;  Location: [facility];  Service: General;  Laterality: N/A;  . Colostomy N/A 03/30/2016    Procedure: COLOSTOMY;  Surgeon: Rolm Bookbinder, MD;  Location: [place] [facility];  Service: General;  Laterality: N/A;  . Colostomy revision N/A 03/30/2016    Procedure:  COLON RESECTION LEFT;  Surgeon: Rolm Bookbinder, MD;  Location: [facility];  Service: General;  Laterality: N/A;  . Laparotomy N/A 04/02/2016    Procedure: Re-exploration of open abdomen, application of abdominal wound vac;  Surgeon: Greer Pickerel, MD;  Location: [facility];  Service: General;  Laterality: N/A;  . Application of wound vac N/A 04/02/2016    Procedure: application of wound vac+;  Surgeon: Greer Pickerel, MD;  Location: [place];  Service: General;  Laterality: N/A;  . Laparotomy N/A 04/04/2016    Procedure: EXPLORATORY LAPAROTOMY, PLACEMENT OF ABRA ABDOMINAL WALL CLOSURE SET ;  Surgeon: Greer Pickerel, MD;  Location: [facility];  Service: General;  Laterality: N/A;  . Laparotomy N/A 04/18/2016    Procedure: EXPLORATORY LAPAROTOMY;  Surgeon: Erroll Luna, MD;  Location: [facility] [place];  Service: General;  Laterality: N/A;  . Abdominal wall defect repair N/A 04/18/2016    Procedure: CLOSURE OF ABDOMEN;  Surgeon: Erroll Luna, MD;  Location: [facility];  Service: General;  Laterality: N/A;   Past Medical History  Diagnosis Date  . Goiter   . Hypothyroidism   . Asthma     mild  . Hypertension   . Atypical endometrial hyperplasia   . Heart murmur   . Vitamin B12 deficiency   . Yeast infection     for last 3 weeks  . Gout   . COPD (chronic obstructive pulmonary disease) (HCC)    BP 151/75 mmHg  Pulse 59  Resp 16  SpO2 95%  LMP 11/18/2012  Opioid Risk Score:   Fall Risk Score:  `1  Depression screen PHQ 2/9  Depression screen PHQ 2/9 05/28/2016  Decreased Interest 2  Down, Depressed, Hopeless 0  PHQ - 2 Score 2  Altered sleeping 0  Tired, decreased energy 0  Change in appetite 0  Feeling bad or failure about yourself  1  Trouble concentrating 0  Moving slowly or fidgety/restless 0  Suicidal thoughts 0  PHQ-9 Score 3  Difficult doing work/chores Not difficult at all     Review of Systems  Constitutional:       Loss of taste or smell   Cardiovascular:       Limb swelling   Musculoskeletal:  Positive for gait problem.  Neurological: Positive for dizziness, weakness and numbness.       Tingling   Psychiatric/Behavioral: Positive for confusion. The patient is nervous/anxious.   All other systems reviewed and are negative.      Objective:   Physical Exam  Constitutional: She is oriented to person, place, and time. She appears well-developed and well-nourished.  Morbidly obese HENT:  Head: Normocephalic and atraumatic.  Eyes: Conjunctivae and EOM are normal. Pupils are equal, round, and reactive to light.  Neck: neck healed  Cardiovascular:  Normal rate and regular rhythm.  No murmur heard. Respiratory: Effort normal. No stridor.   GI: Soft. Bowel sounds are normal. She exhibits no distension. There is tenderness.  Ostomy with formed stool. area clean surrounding Musculoskeletal: She exhibits 1+ to 2+ non pitting  edema. There are no signs of edema or fluid overload elsewhere. Crepitus with left knee ROM. Valgus deformity Neurological: She is alert and oriented to person, place, and time.  Motor:  Bilateral upper extremities 4+/5 proximal to distal Bilateral lower extremities hip flexion, knee extension 4/5, ankle dorsi/plantarflexion 4+/5 Sensation diminished to light touch bilateral feet  She stood up and walked unassisted with and then without her walker for a short dx Skin: Skin is warm and dry. Abdominal wound healing with less fibronecrotic tissue---diameter decreased to 3-4 inches now. Retention sutures (2) still in place   Psychiatric: Her speech is normal. Her affect is appropriate at the moment       Assessment & Plan:  1. Weakness, decreased endurance secondary to debility from multiple medical issues -home health follow up -I would like [place] RN to check BMET and CBC on Monday  -HHPT for edema mgt as below  -she has made really nice progress -can be home alone during the day 2. Gout: allopurinol 3. Pain  Management: decrease to  20mcg fentanyl patch when current box of 45mcg runs out  -wrote RF for oxycodone 5mg  q6 prn #90---would like wean these as well - pain is predominantly abdominal  -likely peripheral neuropathy with stocking glove sensory loss -  hinged left knee brace- to help with knee stability/mobility . 4. Mood: improved with better health and independence 5. Lower extremity edema: I see no obvious signs of fluid overload  -do think it's wise to check weights with scale  -elevation of LE's when sitting  -daily hctz can resume  -ACE wrap  -check bmet Monday by [place]: wound closing nicely -ostomy intact. -outpt general surgery follow up 7. Fluids/Electrolytes/Nutrition: good intake 8. ABLA:  - iron deficiency anemia  -recheck cbc next monday 9. COPD/OSA in setting of morbid obesity    10. Hypothyroid: On supplement.  11. Morbid Obesity  Diet and exercise education.  12. Tobacco abuse: reviewed 13. Bilateral knee pain left knee brace as above   Follow up with me in about 6 weeks. Thirty minutes of face to face patient care time were spent during this visit. All questions were encouraged and answered.

## 2016-06-05 ENCOUNTER — Other Ambulatory Visit (HOSPITAL_COMMUNITY)
Admission: AD | Admit: 2016-06-05 | Discharge: 2016-06-05 | Disposition: A | Discharge status: Home / Self Care | Payer: BLUE CROSS/BLUE SHIELD | Source: Other Acute Inpatient Hospital | Attending: Physical Medicine & Rehabilitation | Admitting: Physical Medicine & Rehabilitation

## 2016-06-05 DIAGNOSIS — Z433 Encounter for attention to colostomy: Secondary | ICD-10-CM | POA: Insufficient documentation

## 2016-06-05 DIAGNOSIS — E46 Unspecified protein-calorie malnutrition: Secondary | ICD-10-CM | POA: Diagnosis present

## 2016-06-05 DIAGNOSIS — R9402 Abnormal brain scan: Secondary | ICD-10-CM | POA: Insufficient documentation

## 2016-06-05 LAB — BASIC METABOLIC PANEL
ANION GAP: 8 (ref 5–15)
BUN: 14 mg/dL (ref 6–20)
CALCIUM: 9 mg/dL (ref 8.9–10.3)
CO2: 27 mmol/L (ref 22–32)
CREATININE: 1.01 mg/dL — AB (ref 0.44–1.00)
Chloride: 98 mmol/L — ABNORMAL LOW (ref 101–111)
GFR calc Af Amer: >60 mL/min (ref >60)
GFR, EST NON AFRICAN AMERICAN: 58 mL/min — AB (ref >60)
GLUCOSE: 95 mg/dL (ref 65–99)
Potassium: 3.4 mmol/L — ABNORMAL LOW (ref 3.5–5.1)
Sodium: 133 mmol/L — ABNORMAL LOW (ref 135–145)

## 2016-06-05 LAB — CBC
HCT: 35.4 % — ABNORMAL LOW (ref 36.0–46.0)
HEMOGLOBIN: 10.6 g/dL — AB (ref 12.0–15.0)
MCH: 27.7 pg (ref 26.0–34.0)
MCHC: 29.9 g/dL — AB (ref 30.0–36.0)
MCV: 92.4 fL (ref 78.0–100.0)
PLATELETS: 363 10*3/uL (ref 150–400)
RBC: 3.83 MIL/uL — ABNORMAL LOW (ref 3.87–5.11)
RDW: 17.6 % — AB (ref 11.5–15.5)
WBC: 9.9 10*3/uL (ref 4.0–10.5)

## 2016-06-18 ENCOUNTER — Other Ambulatory Visit: Payer: Self-pay | Admitting: Physical Medicine and Rehabilitation

## 2016-06-19 ENCOUNTER — Other Ambulatory Visit: Payer: Self-pay | Admitting: Physical Medicine and Rehabilitation

## 2016-06-29 ENCOUNTER — Other Ambulatory Visit (HOSPITAL_COMMUNITY): Payer: Self-pay | Admitting: General Surgery

## 2016-06-29 DIAGNOSIS — R609 Edema, unspecified: Secondary | ICD-10-CM

## 2016-07-02 ENCOUNTER — Ambulatory Visit (HOSPITAL_COMMUNITY)
Admission: RE | Admit: 2016-07-02 | Discharge: 2016-07-02 | Disposition: A | Discharge status: Home / Self Care | Payer: BLUE CROSS/BLUE SHIELD | Source: Ambulatory Visit | Attending: Physician Assistant | Admitting: Physician Assistant

## 2016-07-02 DIAGNOSIS — J449 Chronic obstructive pulmonary disease, unspecified: Secondary | ICD-10-CM | POA: Diagnosis not present

## 2016-07-02 DIAGNOSIS — R609 Edema, unspecified: Secondary | ICD-10-CM | POA: Diagnosis not present

## 2016-07-02 DIAGNOSIS — M109 Gout, unspecified: Secondary | ICD-10-CM | POA: Diagnosis not present

## 2016-07-02 DIAGNOSIS — I10 Essential (primary) hypertension: Secondary | ICD-10-CM | POA: Diagnosis not present

## 2016-07-02 DIAGNOSIS — E039 Hypothyroidism, unspecified: Secondary | ICD-10-CM | POA: Diagnosis not present

## 2016-07-02 NOTE — Progress Notes (Signed)
VASCULAR LAB PRELIMINARY  PRELIMINARY  PRELIMINARY  PRELIMINARY  Bilateral lower extremity venous duplex completed.    Preliminary report:  There is no obvious evidence of DVT or SVT noted in the visualized veins of the bilateral lower extremities.  There is a small Baker's cyst noted in the right popliteal fossa.   Chelsie [place], RVT 07/02/2016, 3:48 PM

## 2016-07-10 ENCOUNTER — Encounter
Payer: BLUE CROSS/BLUE SHIELD | Attending: Physical Medicine & Rehabilitation | Admitting: Physical Medicine & Rehabilitation

## 2016-07-10 ENCOUNTER — Encounter: Payer: Self-pay | Admitting: Physical Medicine & Rehabilitation

## 2016-07-10 VITALS — BP 158/84 | HR 58 | Resp 14

## 2016-07-10 DIAGNOSIS — G4733 Obstructive sleep apnea (adult) (pediatric): Secondary | ICD-10-CM

## 2016-07-10 DIAGNOSIS — E039 Hypothyroidism, unspecified: Secondary | ICD-10-CM | POA: Insufficient documentation

## 2016-07-10 DIAGNOSIS — M25562 Pain in left knee: Secondary | ICD-10-CM | POA: Insufficient documentation

## 2016-07-10 DIAGNOSIS — F17210 Nicotine dependence, cigarettes, uncomplicated: Secondary | ICD-10-CM | POA: Diagnosis not present

## 2016-07-10 DIAGNOSIS — D509 Iron deficiency anemia, unspecified: Secondary | ICD-10-CM | POA: Diagnosis not present

## 2016-07-10 DIAGNOSIS — R52 Pain, unspecified: Secondary | ICD-10-CM | POA: Diagnosis present

## 2016-07-10 DIAGNOSIS — E049 Nontoxic goiter, unspecified: Secondary | ICD-10-CM | POA: Diagnosis not present

## 2016-07-10 DIAGNOSIS — J449 Chronic obstructive pulmonary disease, unspecified: Secondary | ICD-10-CM | POA: Insufficient documentation

## 2016-07-10 DIAGNOSIS — R600 Localized edema: Secondary | ICD-10-CM | POA: Insufficient documentation

## 2016-07-10 DIAGNOSIS — M25561 Pain in right knee: Secondary | ICD-10-CM | POA: Insufficient documentation

## 2016-07-10 DIAGNOSIS — B379 Candidiasis, unspecified: Secondary | ICD-10-CM | POA: Diagnosis not present

## 2016-07-10 DIAGNOSIS — M109 Gout, unspecified: Secondary | ICD-10-CM | POA: Insufficient documentation

## 2016-07-10 DIAGNOSIS — J45909 Unspecified asthma, uncomplicated: Secondary | ICD-10-CM | POA: Insufficient documentation

## 2016-07-10 DIAGNOSIS — S31109S Unspecified open wound of abdominal wall, unspecified quadrant without penetration into peritoneal cavity, sequela: Secondary | ICD-10-CM | POA: Diagnosis not present

## 2016-07-10 DIAGNOSIS — I10 Essential (primary) hypertension: Secondary | ICD-10-CM | POA: Diagnosis not present

## 2016-07-10 DIAGNOSIS — R5381 Other malaise: Secondary | ICD-10-CM

## 2016-07-10 DIAGNOSIS — Z933 Colostomy status: Secondary | ICD-10-CM | POA: Diagnosis not present

## 2016-07-10 DIAGNOSIS — D62 Acute posthemorrhagic anemia: Secondary | ICD-10-CM

## 2016-07-10 DIAGNOSIS — R531 Weakness: Secondary | ICD-10-CM | POA: Diagnosis not present

## 2016-07-10 MED ORDER — FENTANYL 12 MCG/HR TD PT72: 12.5000 ug | MEDICATED_PATCH | TRANSDERMAL | Status: DC

## 2016-07-10 MED ORDER — OXYCODONE HCL 5 MG PO TABS: 5.0000 mg | ORAL_TABLET | Freq: Four times a day (QID) | ORAL | Status: DC | PRN

## 2016-07-10 NOTE — Patient Instructions (Signed)
Continue to work on posture and range of motion. Sit tall and walk tall. "Use your abs!"  PLEASE CALL ME WITH ANY PROBLEMS OR QUESTIONS [phone])

## 2016-07-10 NOTE — Progress Notes (Signed)
Subjective:    Patient ID: Christina Castaneda, female    DOB: 11/02/1956, 60 y.o.   MRN: [id number]  HPI Christina Castaneda is here in follow up of her debility/rehab admission. She is doing well and independent at home with mobility and self-care. She is doing house work/cooking as well.   Her abdominal wound is healing nicely. She has followed up with Dr. Hulen Skains.   She has noticed more "fat" hanging down to the left.   She is on the 25mg  fentanyl patch currently. She is using less and less oxycodone. Her pain is predominantly in the abdomen. She is breaking the oxycodone in half at times.     Pain Inventory Average Pain 2 Pain Right Now 2 My pain is constant, burning, tingling and aching  In the last 24 hours, has pain interfered with the following? General activity 7 Relation with others 3 Enjoyment of life 3 What TIME of day is your pain at its worst? daytime Sleep (in general) Good  Pain is worse with: walking and standing Pain improves with: medication Relief from Meds: 8  Mobility walk with assistance use a walker ability to climb steps?  yes do you drive?  no transfers alone Do you have any goals in this area?  yes  Function not employed: date last employed .  Neuro/Psych tingling trouble walking  Prior Studies Any changes since last visit?  no  Physicians involved in your care Any changes since last visit?  no   Family History  Problem Relation Age of Onset  . COPD Mother   . Diabetes Mother   . Congestive Heart Failure Mother   . Diabetes Father   . Heart attack Father    Social History   Social History  . Marital Status: Married    Spouse Name: N/A  . Number of Children: N/A  . Years of Education: N/A   Social History Main Topics  . Smoking status: Current Every Day Smoker -- 1.00 packs/day for 28 years    Types: Cigarettes  . Smokeless tobacco: Never Used  . Alcohol Use: 0.5 - 1.5 oz/week    1-3 Standard drinks or equivalent per week     Comment:  "couple beers on the weekend"  . Drug Use: No  . Sexual Activity:    Partners: Male    Birth Control/ Protection: Surgical     Comment: Hysterectomy   Other Topics Concern  . None   Social History Narrative   Past Surgical History  Procedure Laterality Date  . Neck surgery  1994    repair disk  . Tonsillectomy      as a child  . Cholecystectomy  2009    gallstone removed  . Robotic assisted total hysterectomy with bilateral salpingo oopherectomy  11/18/2012    Procedure: ROBOTIC ASSISTED TOTAL HYSTERECTOMY WITH BILATERAL SALPINGO OOPHORECTOMY;  Surgeon: Imagene Gurney A. Alycia Rossetti, MD;  Location: [facility];  Service: Gynecology;  Laterality: N/A;  . Colostomy revision N/A 03/28/2016    Procedure: COLOSTOMY REVISION;  Surgeon: Judeth Horn, MD;  Location: [place];  Service: General;  Laterality: N/A;  . Laparotomy N/A 03/28/2016    Procedure: EXPLORATORY LAPAROTOMY;  Surgeon: Judeth Horn, MD;  Location: [place];  Service: General;  Laterality: N/A;  . Omentectomy N/A 03/28/2016    Procedure: OMENTECTOMY;  Surgeon: Judeth Horn, MD;  Location: [place];  Service: General;  Laterality: N/A;  . Wound debridement N/A 03/28/2016    Procedure: DEBRIDEMENT WOUND;  Surgeon: Judeth Horn, MD;  Location[facility];  Service: General;  Laterality: N/A;  . Vacuum assisted closure change N/A 03/30/2016    Procedure: ABDOMINAL VAC CHANGE;  Surgeon: Rolm Bookbinder, MD;  Location: [place];  Service: General;  Laterality: N/A;  . Colostomy N/A 03/30/2016    Procedure: COLOSTOMY;  Surgeon: Rolm Bookbinder, MD;  Location: [place];  Service: General;  Laterality: N/A;  . Colostomy revision N/A 03/30/2016    Procedure: COLON RESECTION LEFT;  Surgeon: Rolm Bookbinder, MD;  Location: [place];  Service: General;  Laterality: N/A;  . Laparotomy N/A 04/02/2016    Procedure: Re-exploration of open abdomen, application of abdominal wound vac;  Surgeon: Greer Pickerel, MD;  Location: [place];  Service: General;  Laterality: N/A;  . Application of wound  vac N/A 04/02/2016    Procedure: application of wound vac+;  Surgeon: Greer Pickerel, MD;  Location: [place];  Service: General;  Laterality: N/A;  . Laparotomy N/A 04/04/2016    Procedure: EXPLORATORY LAPAROTOMY, PLACEMENT OF ABRA ABDOMINAL WALL CLOSURE SET ;  Surgeon: Greer Pickerel, MD;  Location: [place];  Service: General;  Laterality: N/A;  . Laparotomy N/A 04/18/2016    Procedure: EXPLORATORY LAPAROTOMY;  Surgeon: Erroll Luna, MD;  Location: [place];  Service: General;  Laterality: N/A;  . Abdominal wall defect repair N/A 04/18/2016    Procedure: CLOSURE OF ABDOMEN;  Surgeon: Erroll Luna, MD;  Location: [place];  Service: General;  Laterality: N/A;   Past Medical History  Diagnosis Date  . Goiter   . Hypothyroidism   . Asthma     mild  . Hypertension   . Atypical endometrial hyperplasia   . Heart murmur   . Vitamin B12 deficiency   . Yeast infection     for last 3 weeks  . Gout   . COPD (chronic obstructive pulmonary disease) (HCC)    BP 158/84 mmHg  Pulse 58  Resp 14  SpO2 97%  LMP 11/18/2012  Opioid Risk Score:   Fall Risk Score:  `1  Depression screen PHQ 2/9  Depression screen PHQ 2/9 05/28/2016  Decreased Interest 2  Down, Depressed, Hopeless 0  PHQ - 2 Score 2  Altered sleeping 0  Tired, decreased energy 0  Change in appetite 0  Feeling bad or failure about yourself  1  Trouble concentrating 0  Moving slowly or fidgety/restless 0  Suicidal thoughts 0  PHQ-9 Score 3  Difficult doing work/chores Not difficult at all     Review of Systems  Constitutional: Negative.   HENT: Negative.   Eyes: Negative.   Respiratory: Negative.   Cardiovascular: Positive for leg swelling.  Gastrointestinal: Negative.   Endocrine: Negative.   Genitourinary: Positive for difficulty urinating.  Musculoskeletal: Positive for arthralgias and gait problem.  Allergic/Immunologic: Negative.   Neurological:       Tingling  Hematological: Negative.   Psychiatric/Behavioral: Negative.          Objective:   Physical Exam  Constitutional: She is oriented to person, place, and time. She appears well-developed and well-nourished.  Morbidly obese  HENT:  Head: Normocephalic and atraumatic.  Eyes: Conjunctivae and EOM are normal. Pupils are equal, round, and reactive to light.  Neck: neck healed  Cardiovascular: Normal rate and regular rhythm.  No murmur heard.  Respiratory: Effort normal. No stridor.  GI: Soft. Bowel sounds are normal. She exhibits no distension. There is tenderness.  Ostomy with formed stool. area clean surrounding Musculoskeletal: She exhibits 1+ to 2+ non pitting edema. There are no  signs of edema or fluid overload elsewhere.  Crepitus with left knee ROM. Valgus deformity Neurological: She is alert and oriented to person, place, and time.  Motor:  Bilateral upper extremities 4+/5 proximal to distal Bilateral lower extremities hip flexion, knee extension 4/5, ankle dorsi/plantarflexion 4+/5 Sensation diminished to light touch bilateral feet. ambulates with walker and without. Good weight shift. Needed extra time to stand but once she was erect did well.  She stood up and walked unassisted with and then without her walker for a short dx  Skin: Skin is warm and dry. Abdominal wound healing   Psychiatric: Her speech is normal. Her affect is appropriate at the moment   Assessment & Plan:   1. Weakness, decreased endurance secondary to debility from multiple medical issues  -continue HEP 2. Gout: allopurinol  3. Pain Management: decrease to the 12.40mcg fentanyl patch  -wrote RF for oxycodone 5mg  q6 prn #90---would like wean these as well  -pain is predominantly abdominal  -likely peripheral neuropathy with stocking glove sensory loss too  4. Mood: improved with better health and independence  5. Lower extremity edema: local mgt  6. Skin/Wound Care: wound closing nicely  -ostomy intact.  -outpt general surgery follow up  7. Morbid Obesity  8.  Bilateral knee pain  left knee brace as above     Follow up in 3 months.. 15 minutes of face to face patient care time were spent during this visit. All questions were encouraged and answered.

## 2016-07-18 ENCOUNTER — Other Ambulatory Visit: Payer: Self-pay | Admitting: *Deleted

## 2016-07-18 NOTE — Telephone Encounter (Signed)
[facility] is requesting refill on trazodone. It was given at discharge by Pam and then refilled x 1 by Yvone Neu but no mention in note at your visit.  Do you want to continue to refill?

## 2016-07-18 NOTE — Telephone Encounter (Signed)
error 

## 2016-07-20 NOTE — Telephone Encounter (Signed)
Pharmacy is calling for a refill on Trazadone 50mg  tab.

## 2016-07-20 NOTE — Telephone Encounter (Addendum)
This was sent to [facility] for advice since no mention in notes

## 2016-07-24 NOTE — Telephone Encounter (Signed)
Pt is calling again to refill the Trazodone and Pantoprazole. There is no mention in your last OV. Please advise on refill?

## 2016-08-10 ENCOUNTER — Telehealth: Payer: Self-pay | Admitting: *Deleted

## 2016-08-10 NOTE — Telephone Encounter (Signed)
There is no attached conversation

## 2016-08-10 NOTE — Telephone Encounter (Signed)
Message coped to telephone encounter and sent to MD

## 2016-09-05 ENCOUNTER — Other Ambulatory Visit: Payer: Self-pay | Admitting: General Surgery

## 2016-09-05 DIAGNOSIS — Z933 Colostomy status: Secondary | ICD-10-CM

## 2016-09-28 ENCOUNTER — Ambulatory Visit
Admission: RE | Admit: 2016-09-28 | Discharge: 2016-09-28 | Disposition: A | Discharge status: Home / Self Care | Payer: BLUE CROSS/BLUE SHIELD | Source: Ambulatory Visit | Attending: General Surgery | Admitting: General Surgery

## 2016-09-28 DIAGNOSIS — Z933 Colostomy status: Secondary | ICD-10-CM

## 2016-10-10 ENCOUNTER — Encounter
Payer: BLUE CROSS/BLUE SHIELD | Attending: Physical Medicine & Rehabilitation | Admitting: Physical Medicine & Rehabilitation

## 2016-10-10 ENCOUNTER — Encounter: Payer: Self-pay | Admitting: Physical Medicine & Rehabilitation

## 2016-10-10 VITALS — BP 128/83 | HR 60 | Resp 14

## 2016-10-10 DIAGNOSIS — J449 Chronic obstructive pulmonary disease, unspecified: Secondary | ICD-10-CM | POA: Diagnosis not present

## 2016-10-10 DIAGNOSIS — M25561 Pain in right knee: Secondary | ICD-10-CM | POA: Diagnosis not present

## 2016-10-10 DIAGNOSIS — D509 Iron deficiency anemia, unspecified: Secondary | ICD-10-CM | POA: Diagnosis not present

## 2016-10-10 DIAGNOSIS — G8929 Other chronic pain: Secondary | ICD-10-CM

## 2016-10-10 DIAGNOSIS — R5381 Other malaise: Secondary | ICD-10-CM | POA: Diagnosis not present

## 2016-10-10 DIAGNOSIS — E049 Nontoxic goiter, unspecified: Secondary | ICD-10-CM | POA: Insufficient documentation

## 2016-10-10 DIAGNOSIS — F17210 Nicotine dependence, cigarettes, uncomplicated: Secondary | ICD-10-CM | POA: Insufficient documentation

## 2016-10-10 DIAGNOSIS — M25562 Pain in left knee: Secondary | ICD-10-CM | POA: Insufficient documentation

## 2016-10-10 DIAGNOSIS — E039 Hypothyroidism, unspecified: Secondary | ICD-10-CM | POA: Diagnosis not present

## 2016-10-10 DIAGNOSIS — I10 Essential (primary) hypertension: Secondary | ICD-10-CM | POA: Diagnosis not present

## 2016-10-10 DIAGNOSIS — R600 Localized edema: Secondary | ICD-10-CM | POA: Insufficient documentation

## 2016-10-10 DIAGNOSIS — G894 Chronic pain syndrome: Secondary | ICD-10-CM

## 2016-10-10 DIAGNOSIS — Z933 Colostomy status: Secondary | ICD-10-CM | POA: Diagnosis not present

## 2016-10-10 DIAGNOSIS — J45909 Unspecified asthma, uncomplicated: Secondary | ICD-10-CM | POA: Diagnosis not present

## 2016-10-10 DIAGNOSIS — M109 Gout, unspecified: Secondary | ICD-10-CM | POA: Insufficient documentation

## 2016-10-10 DIAGNOSIS — B379 Candidiasis, unspecified: Secondary | ICD-10-CM | POA: Diagnosis not present

## 2016-10-10 DIAGNOSIS — R52 Pain, unspecified: Secondary | ICD-10-CM | POA: Diagnosis present

## 2016-10-10 NOTE — Progress Notes (Signed)
Subjective:    Patient ID: Christina Castaneda, female    DOB: 07-16-1956, 60 y.o.   MRN: [id number]  HPI   [name] is here in follow up her rehab stay and associated debility. She has progressed to a cane. She walked in from the front door today and did well. Stairs are still a struggle but she is progressing. She needs extra time for transfers still.   She is in the process of having her ostomy taken down and is hopeful that the surgery will take place over the next couple months.   She has gone off the fentanyl and hydrocodone. She uses heat sometimes for pain relief.        Pain Inventory Average Pain 2 Pain Right Now 1 My pain is dull and tingling  In the last 24 hours, has pain interfered with the following? General activity 2 Relation with others 0 Enjoyment of life 0 What TIME of day is your pain at its worst? Daytime, night Sleep (in general) Good  Pain is worse with: some activites Pain improves with: pacing activities Relief from Meds: 4  Mobility use a cane use a walker ability to climb steps?  no do you drive?  yes  Function disabled: date disabled 02/2016  Neuro/Psych weakness numbness tingling trouble walking spasms  Prior Studies Any changes since last visit?  no  Physicians involved in your care Any changes since last visit?  no   Family History  Problem Relation Age of Onset  . COPD Mother   . Diabetes Mother   . Congestive Heart Failure Mother   . Diabetes Father   . Heart attack Father    Social History   Social History  . Marital status: Married    Spouse name: N/A  . Number of children: N/A  . Years of education: N/A   Social History Main Topics  . Smoking status: Current Every Day Smoker    Packs/day: 1.00    Years: 28.00    Types: Cigarettes  . Smokeless tobacco: Never Used  . Alcohol use 0.5 - 1.5 oz/week    1 - 3 Standard drinks or equivalent per week     Comment: "couple beers on the weekend"  . Drug use:  No  . Sexual activity: Yes    Partners: Male    Birth control/ protection: Surgical     Comment: Hysterectomy   Other Topics Concern  . None   Social History Narrative  . None   Past Surgical History:  Procedure Laterality Date  . ABDOMINAL WALL DEFECT REPAIR N/A 04/18/2016   Procedure: CLOSURE OF ABDOMEN;  Surgeon: Erroll Luna, MD;  Location: [place];  Service: General;  Laterality: N/A;  . APPLICATION OF WOUND VAC N/A 04/02/2016   Procedure: application of wound vac+;  Surgeon: Greer Pickerel, MD;  Location: [place];  Service: General;  Laterality: N/A;  . CHOLECYSTECTOMY  2009   gallstone removed  . COLOSTOMY N/A 03/30/2016   Procedure: COLOSTOMY;  Surgeon: Rolm Bookbinder, MD;  Location: [place];  Service: General;  Laterality: N/A;  . COLOSTOMY REVISION N/A 03/28/2016   Procedure: COLOSTOMY REVISION;  Surgeon: Judeth Horn, MD;  Location: [place];  Service: General;  Laterality: N/A;  . COLOSTOMY REVISION N/A 03/30/2016   Procedure: COLON RESECTION LEFT;  Surgeon: Rolm Bookbinder, MD;  Location: [place];  Service: General;  Laterality: N/A;  . LAPAROTOMY N/A 03/28/2016   Procedure: EXPLORATORY LAPAROTOMY;  Surgeon: Judeth Horn, MD;  Location: [facility];  Service: General;  Laterality: N/A;  . LAPAROTOMY N/A 04/02/2016   Procedure: Re-exploration of open abdomen, application of abdominal wound vac;  Surgeon: Greer Pickerel, MD;  Location: [place];  Service: General;  Laterality: N/A;  . LAPAROTOMY N/A 04/04/2016   Procedure: EXPLORATORY LAPAROTOMY, PLACEMENT OF ABRA ABDOMINAL WALL CLOSURE SET ;  Surgeon: Greer Pickerel, MD;  Location: [place];  Service: General;  Laterality: N/A;  . LAPAROTOMY N/A 04/18/2016   Procedure: EXPLORATORY LAPAROTOMY;  Surgeon: Erroll Luna, MD;  Location: [place];  Service: General;  Laterality: N/A;  . NECK SURGERY  1994   repair disk  . OMENTECTOMY N/A 03/28/2016   Procedure: OMENTECTOMY;  Surgeon: Judeth Horn, MD;  Location: [place];  Service: General;  Laterality: N/A;  . ROBOTIC  ASSISTED TOTAL HYSTERECTOMY WITH BILATERAL SALPINGO OOPHERECTOMY  11/18/2012   Procedure: ROBOTIC ASSISTED TOTAL HYSTERECTOMY WITH BILATERAL SALPINGO OOPHORECTOMY;  Surgeon: Imagene Gurney A. Alycia Rossetti, MD;  Location: [facility];  Service: Gynecology;  Laterality: N/A;  . TONSILLECTOMY     as a child  . VACUUM ASSISTED CLOSURE CHANGE N/A 03/30/2016   Procedure: ABDOMINAL VAC CHANGE;  Surgeon: Rolm Bookbinder, MD;  Location: [place];  Service: General;  Laterality: N/A;  . WOUND DEBRIDEMENT N/A 03/28/2016   Procedure: DEBRIDEMENT WOUND;  Surgeon: Judeth Horn, MD;  Location: [place];  Service: General;  Laterality: N/A;   Past Medical History:  Diagnosis Date  . Asthma    mild  . Atypical endometrial hyperplasia   . COPD (chronic obstructive pulmonary disease) ([place])   . Goiter   . Gout   . Heart murmur   . Hypertension   . Hypothyroidism   . Vitamin B12 deficiency   . Yeast infection    for last 3 weeks   BP 128/83   Pulse 60   Resp 14   LMP 11/18/2012   SpO2 99%   Opioid Risk Score:   Fall Risk Score:  `1  Depression screen PHQ 2/9  Depression screen PHQ 2/9 05/28/2016  Decreased Interest 2  Down, Depressed, Hopeless 0  PHQ - 2 Score 2  Altered sleeping 0  Tired, decreased energy 0  Change in appetite 0  Feeling bad or failure about yourself  1  Trouble concentrating 0  Moving slowly or fidgety/restless 0  Suicidal thoughts 0  PHQ-9 Score 3  Difficult doing work/chores Not difficult at all     Review of Systems  Respiratory: Positive for apnea and shortness of breath.   Musculoskeletal: Positive for joint swelling.       Limb sweating   All other systems reviewed and are negative.      Objective:   Physical Exam  Head: Normocephalic and atraumatic.  Eyes: Conjunctivae and EOM are normal. Pupils are equal, round, and reactive to light.  Neck: neck healed  Cardiovascular: Normal rate and regular rhythm.  No murmur heard.  Respiratory: Effort normal. No stridor.  GI: Soft.  Bowel sounds are normal. She exhibits no distension. There is tenderness.  Ostomy with formed stool. area clean surrounding. Abdomen protuberant Musculoskeletal: She exhibits 1+   non pitting edema. There are no signs of edema or fluid overload elsewhere.  Crepitus with left knee ROM. Valgus deformity Neurological: She is alert and oriented to person, place, and time.  Motor:  Bilateral upper extremities 5/5 proximal to distal Bilateral lower extremities hip flexion, knee extension 4/5, ankle dorsi/plantarflexion 4+/5 Sensation diminished to light touch bilateral feet. Ambulates with and without straight cane. Gait  is slightly wide-based.  She stood up and walked unassisted with and then without her walker for a short dx  Skin: Skin is warm and dry. Abdominal wound healed   Psychiatric: Her speech is normal. Her affect is appropriate at the moment   Assessment & Plan:   1. Weakness, decreased endurance secondary to debility from multiple medical issues  -continue HEP -progressing nicely -handicapped parking pass form was filled 2. Gout: allopurinol  3. Pain Management:  Symptomatic mgt -heat/ice 4. likely peripheral neuropathy with stocking glove sensory loss  5. Lower extremity edema: local mgt , elevation and compression 6. Skin/Wound Care: wound closing nicely  -ostomy intact.  -outpt general surgery follow up  7. Morbid Obesity  8. Bilateral knee pain  left knee brace as above     Follow up prn. 15 minutes of face to face patient care time were spent during this visit. All questions were encouraged and answered.

## 2016-10-10 NOTE — Patient Instructions (Signed)
PLEASE CALL ME WITH ANY PROBLEMS OR QUESTIONS ([phone])  

## 2016-10-24 DIAGNOSIS — K9402 Colostomy infection: Secondary | ICD-10-CM

## 2016-10-24 DIAGNOSIS — J449 Chronic obstructive pulmonary disease, unspecified: Secondary | ICD-10-CM | POA: Diagnosis not present

## 2016-10-24 DIAGNOSIS — G894 Chronic pain syndrome: Secondary | ICD-10-CM | POA: Diagnosis not present

## 2016-10-24 DIAGNOSIS — K632 Fistula of intestine: Secondary | ICD-10-CM | POA: Diagnosis not present

## 2016-12-12 IMAGING — DX DG CHEST 1V PORT
1 series · 1 of 1 positions shown · non-contrast
Comparison: 04/06/2016

CLINICAL DATA: 60-year-old female with a history of hypertension

EXAM:
PORTABLE CHEST 1 VIEW

[chest ap]
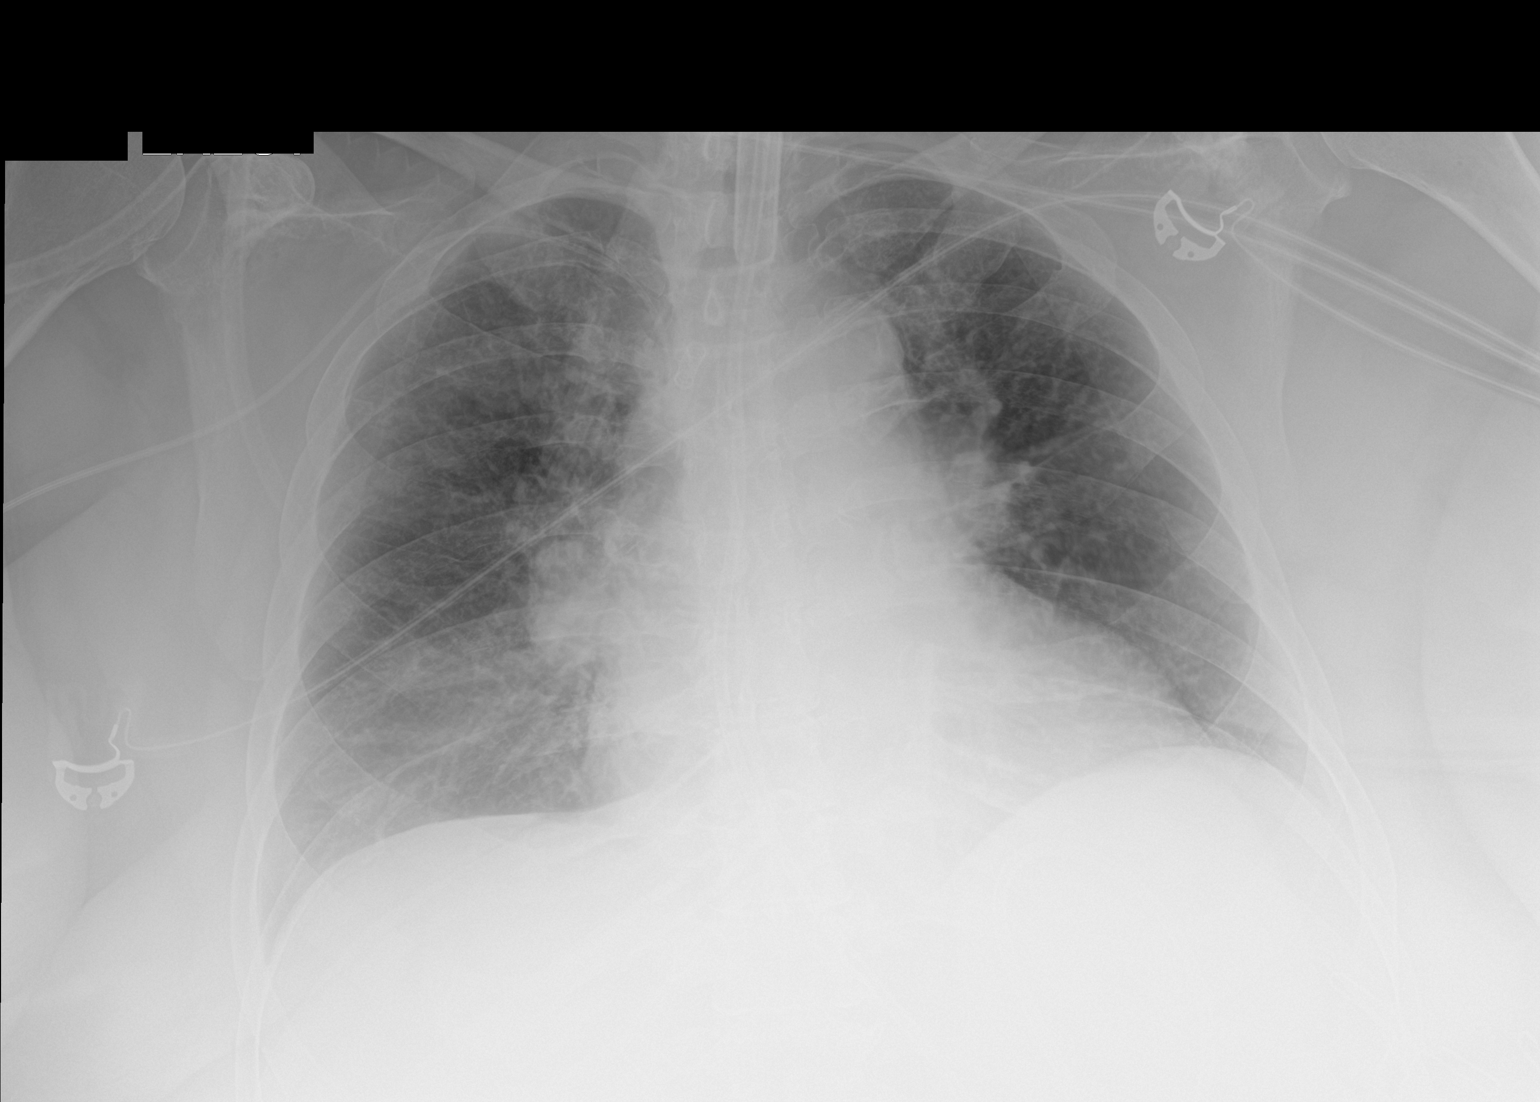

[1 of 1 positions shown; findings below may reference images not displayed]

FINDINGS: Cardiomediastinal silhouette unchanged in size and contour.

Vague airspace and interstitial opacities at the bilateral lung
bases.

Unchanged tracheostomy tube.

Unchanged gastric tube, terminating out of the field of view.

Unchanged right sided upper extremity PICC.

Atherosclerosis.
IMPRESSION: Low lung volumes with bibasilar vague opacities, potentially
atelectasis or developing consolidation. No pleural effusion.

Unchanged right upper extremity PICC, gastric tube, and tracheostomy
tube.

Atherosclerosis.

## 2016-12-14 IMAGING — CR DG CHEST 1V PORT
1 series · 1 of 1 positions shown · non-contrast
Comparison: 04/08/2016.

CLINICAL DATA: 60-year-old female with respiratory failure.
Subsequent encounter.

EXAM:
PORTABLE CHEST 1 VIEW

[AP]
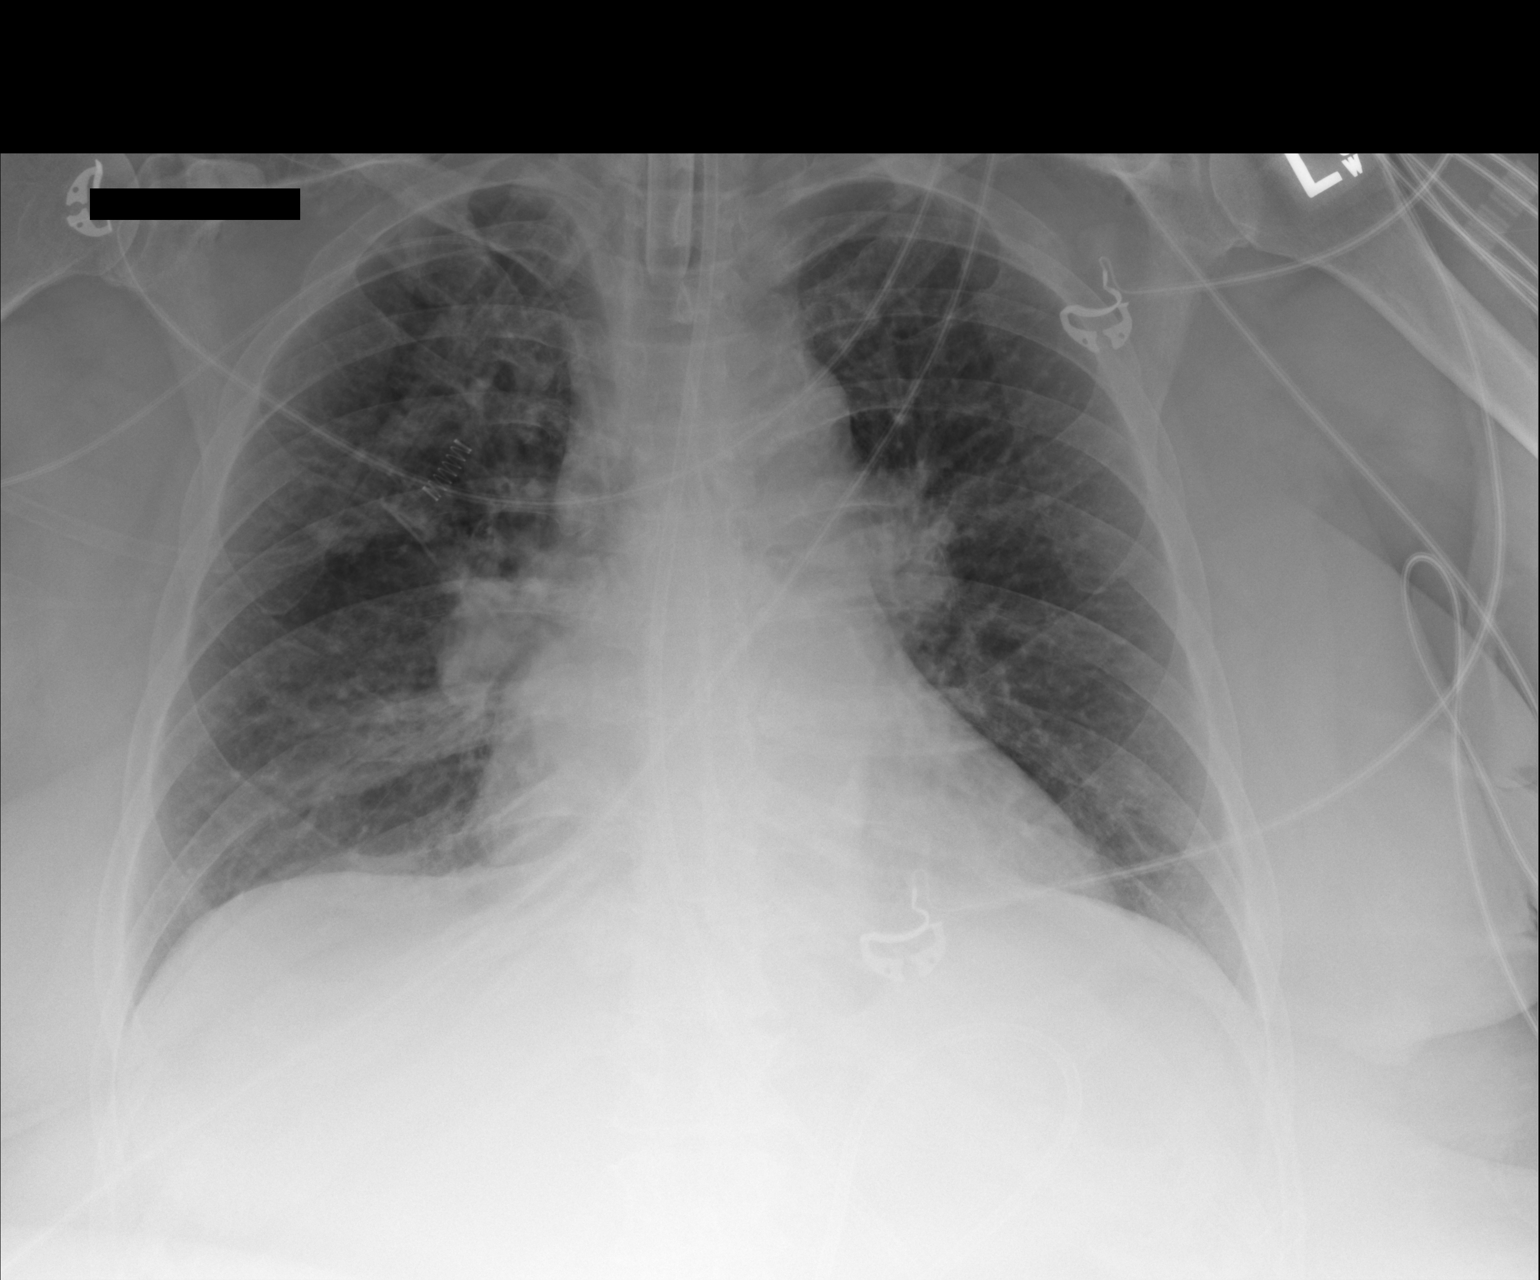

[1 of 1 positions shown; findings below may reference images not displayed]

FINDINGS: Tracheostomy tube tip midline. Right central line tip distal
superior vena cava level. Feeding tube in place with tip not imaged
on present exam.

Mild cardiomegaly.

Mild pulmonary vascular prominence most notable centrally.

Right infrahilar consolidation may represent atelectasis rather than
infiltrate. Attention to this on follow-up.
IMPRESSION: Mild pulmonary vascular prominence most notable centrally.

Right infrahilar consolidation may represent atelectasis rather than
infiltrate. Attention to this on follow-up.

## 2016-12-20 IMAGING — CR DG ABD PORTABLE 1V
2 series · 2 of 2 positions shown · non-contrast
Comparison: Abdominal film 04/05/2016

CLINICAL DATA: Emesis 3 times last night.

EXAM:
PORTABLE ABDOMEN - 1 VIEW

[AP (1 of 2)]
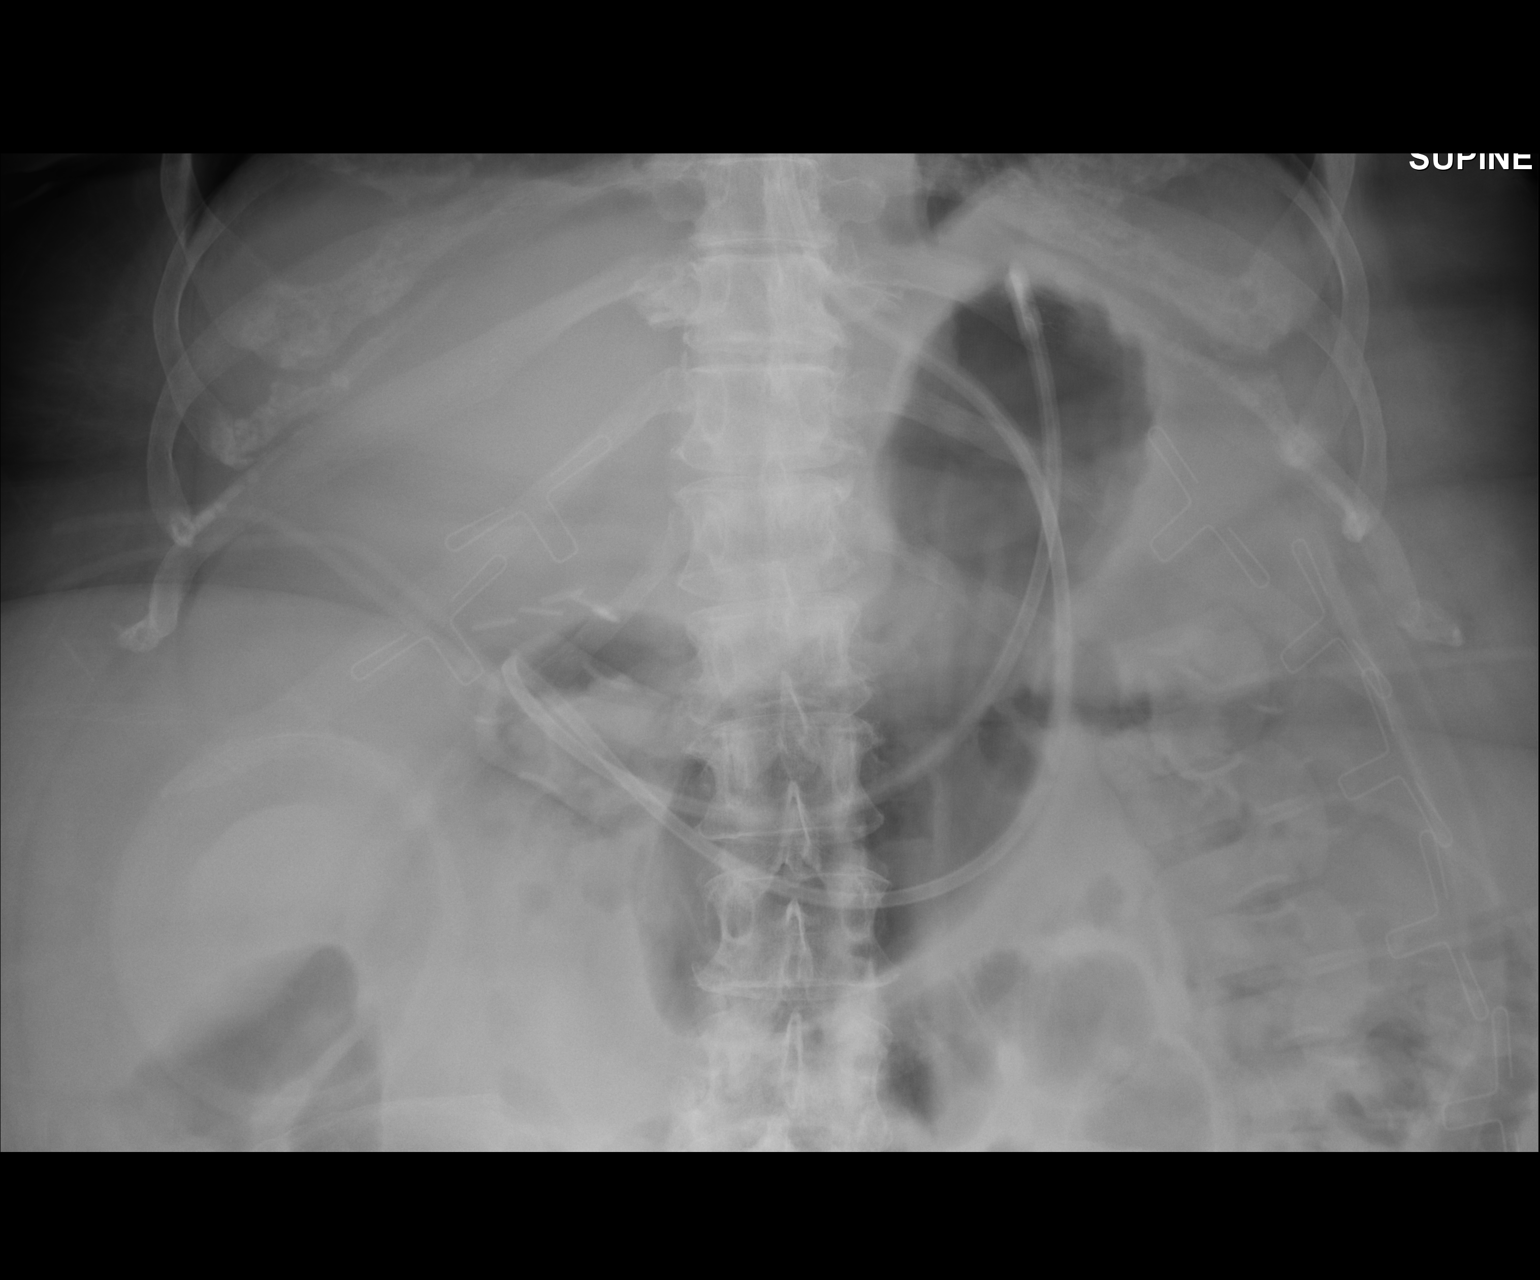

[AP (2 of 2)]
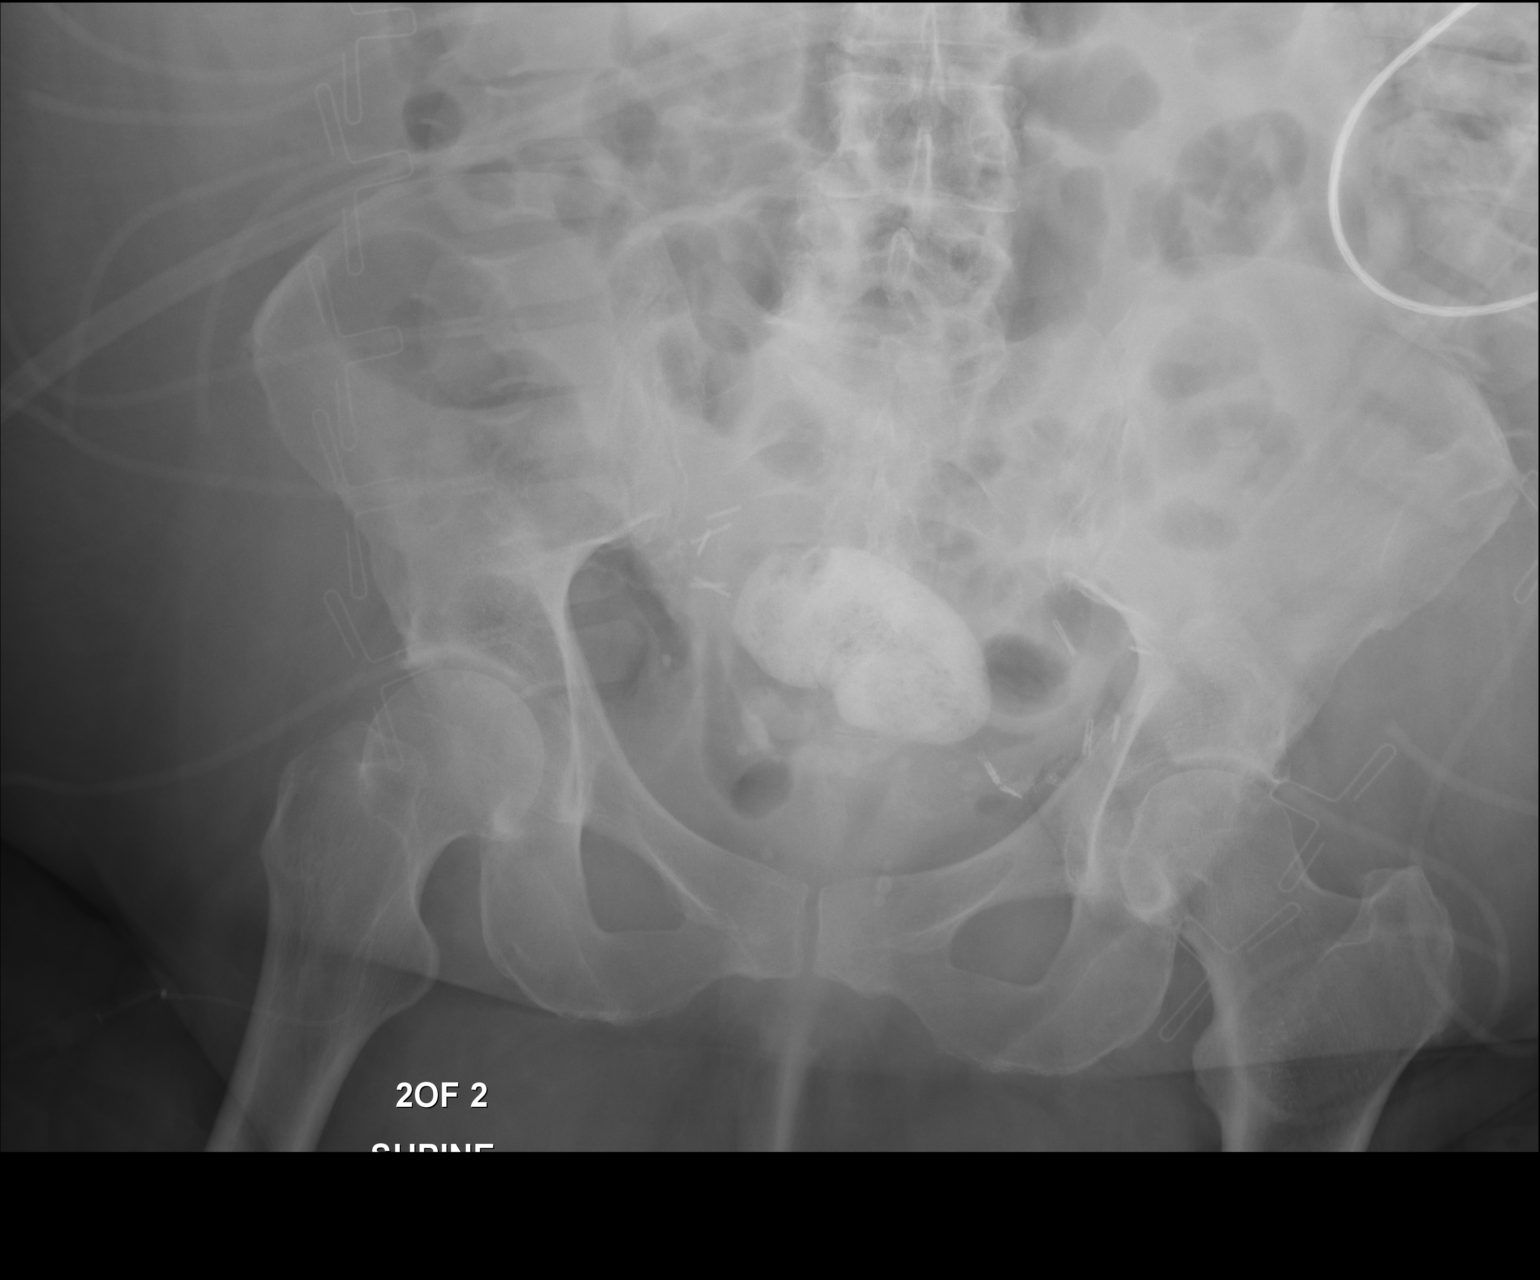

[2 of 2 positions shown; findings below may reference images not displayed]

FINDINGS: The feeding tube looped in the stomach with tip in the fundus of the
stomach. No dilated loops of large or small bowel. Small amount of
contrast in the rectum.
IMPRESSION: Feeding tube looped in the stomach with tip in the gastric fundus

## 2016-12-20 IMAGING — CR DG CHEST 1V PORT
1 series · 1 of 1 positions shown · non-contrast
Comparison: 04/11/2016

CLINICAL DATA: Endotracheal tube present

EXAM:
PORTABLE CHEST 1 VIEW

[AP]
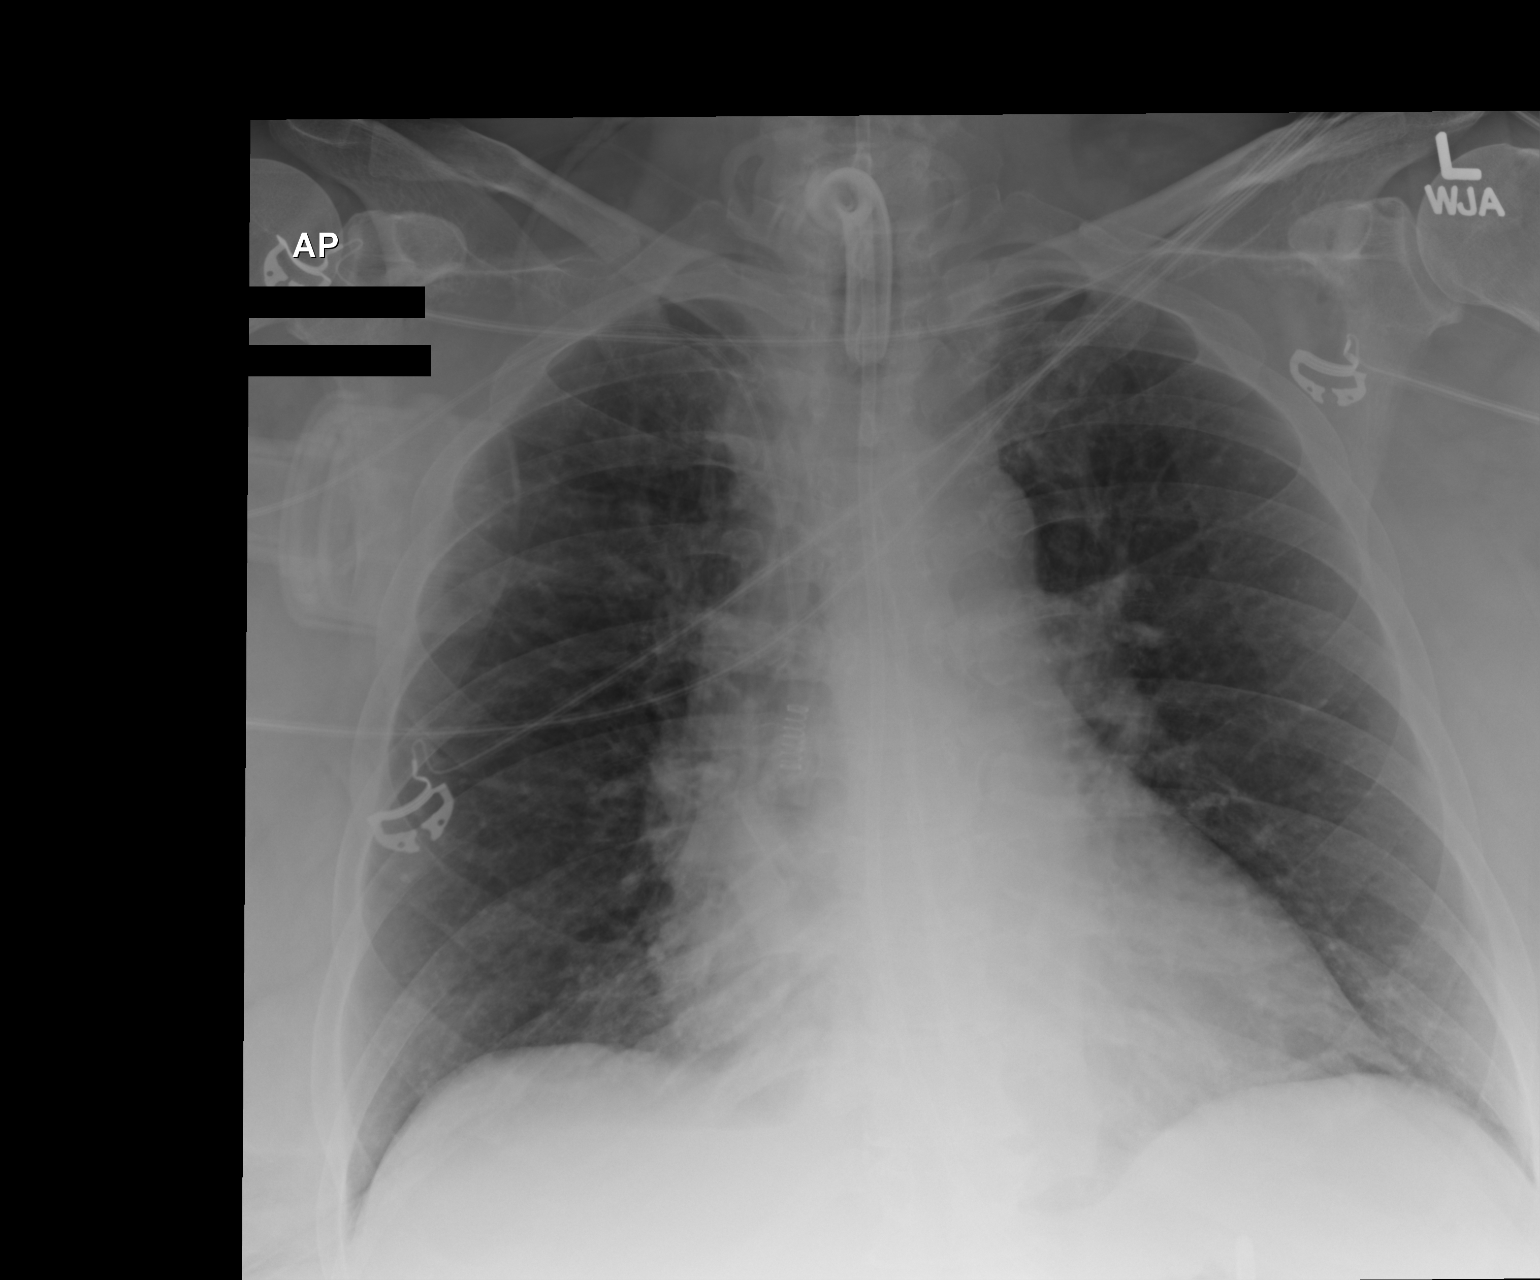

[1 of 1 positions shown; findings below may reference images not displayed]

FINDINGS: Tracheostomy tube and feeding tube unchanged. RIGHT PICC line
unchanged. Stable mildly enlarged cardiac silhouette. There is
central venous congestion pattern unchanged. No consolidation or
pneumothorax.
IMPRESSION: 1. Stable support apparatus.
2. No interval change.
3. Central venous congestion.

## 2017-02-21 ENCOUNTER — Encounter (INDEPENDENT_AMBULATORY_CARE_PROVIDER_SITE_OTHER): Payer: Self-pay | Admitting: Vascular Surgery

## 2017-02-21 ENCOUNTER — Ambulatory Visit (INDEPENDENT_AMBULATORY_CARE_PROVIDER_SITE_OTHER): Payer: BLUE CROSS/BLUE SHIELD | Admitting: Vascular Surgery

## 2017-02-21 VITALS — BP 172/84 | HR 80 | Resp 17 | Ht 67.0 in | Wt 305.0 lb

## 2017-02-21 DIAGNOSIS — I872 Venous insufficiency (chronic) (peripheral): Secondary | ICD-10-CM | POA: Diagnosis not present

## 2017-02-21 DIAGNOSIS — I890 Lymphedema, not elsewhere classified: Secondary | ICD-10-CM

## 2017-02-21 DIAGNOSIS — I10 Essential (primary) hypertension: Secondary | ICD-10-CM

## 2017-02-21 DIAGNOSIS — J449 Chronic obstructive pulmonary disease, unspecified: Secondary | ICD-10-CM

## 2017-02-21 DIAGNOSIS — G4733 Obstructive sleep apnea (adult) (pediatric): Secondary | ICD-10-CM

## 2017-02-21 NOTE — Progress Notes (Signed)
MRN : [id number]  Christina Castaneda is a 61 y.o. (Mar 01, 1956) female who presents with chief complaint of  Chief Complaint  Patient presents with  . New Patient (Initial Visit)  .  History of Present Illness: Patient is seen for evaluation of leg swelling. The patient first noticed the swelling remotely but is now concerned because of a significant increase in the overall edema. The swelling is associated with pain and discoloration. The patient notes that in the morning the legs are significantly improved but they steadily worsened throughout the course of the day. Elevation makes the legs better, dependency makes them much worse.   There is no history of ulcerations associated with the swelling.   The patient denies any recent changes in their medications.  The patient has not been wearing graduated compression.  The patient has no had any past angiography, interventions or vascular surgery.  The patient denies a history of DVT or PE. There is no prior history of phlebitis. There is no history of primary lymphedema.  There is no history of radiation treatment to the groin or pelvis No history of malignancies. No history of trauma or groin or pelvic surgery. No history of foreign travel or parasitic infections area    Current Meds  Medication Sig  . ADVAIR DISKUS 250-50 MCG/DOSE AEPB Inhale 1 puff into the lungs 2 (two) times daily. Once in the am & once at pm  . albuterol (PROVENTIL HFA;VENTOLIN HFA) 108 (90 BASE) MCG/ACT inhaler Inhale 2 puffs into the lungs every 6 (six) hours as needed. Wheezing and shortness of breath  . allopurinol (ZYLOPRIM) 100 MG tablet Take 1 tablet (100 mg total) by mouth daily.  . ergocalciferol (VITAMIN D2) 50000 UNITS capsule Take 50,000 Units by mouth once a week.  . furosemide (LASIX) 20 MG tablet Take 20 mg by mouth daily.  Marland Kitchen gabapentin (NEURONTIN) 100 MG capsule Take by mouth.  . hydrochlorothiazide (HYDRODIURIL) 25 MG tablet Take 1 tablet (25  mg total) by mouth daily.  Marland Kitchen levothyroxine (SYNTHROID, LEVOTHROID) 200 MCG tablet Take 200 mcg by mouth daily before breakfast.   . lisinopril (PRINIVIL,ZESTRIL) 20 MG tablet Take 20 mg by mouth daily.  Marland Kitchen senna-docusate (SENOKOT-S) 8.6-50 MG tablet Take 2 tablets by mouth 3 (three) times daily before meals.    Past Medical History:  Diagnosis Date  . Asthma    mild  . Atypical endometrial hyperplasia   . COPD (chronic obstructive pulmonary disease) ([place])   . Goiter   . Gout   . Heart murmur   . Hypertension   . Hypothyroidism   . Vitamin B12 deficiency   . Yeast infection    for last 3 weeks    Past Surgical History:  Procedure Laterality Date  . ABDOMINAL WALL DEFECT REPAIR N/A 04/18/2016   Procedure: CLOSURE OF ABDOMEN;  Surgeon: Erroll Luna, MD;  Location: [place];  Service: General;  Laterality: N/A;  . APPLICATION OF WOUND VAC N/A 04/02/2016   Procedure: application of wound vac+;  Surgeon: Greer Pickerel, MD;  Location: [facility];  Service: General;  Laterality: N/A;  . CHOLECYSTECTOMY  2009   gallstone removed  . COLOSTOMY N/A 03/30/2016   Procedure: COLOSTOMY;  Surgeon: Rolm Bookbinder, MD;  Location: [place];  Service: General;  Laterality: N/A;  . COLOSTOMY REVISION N/A 03/28/2016   Procedure: COLOSTOMY REVISION;  Surgeon: Judeth Horn, MD;  Location: [place];  Service: General;  Laterality: N/A;  . COLOSTOMY REVISION N/A 03/30/2016   Procedure:  COLON RESECTION LEFT;  Surgeon: Rolm Bookbinder, MD;  Location: [place];  Service: General;  Laterality: N/A;  . LAPAROTOMY N/A 03/28/2016   Procedure: EXPLORATORY LAPAROTOMY;  Surgeon: Judeth Horn, MD;  Location: [facility];  Service: General;  Laterality: N/A;  . LAPAROTOMY N/A 04/02/2016   Procedure: Re-exploration of open abdomen, application of abdominal wound vac;  Surgeon: Greer Pickerel, MD;  Location: [facility];  Service: General;  Laterality: N/A;  . LAPAROTOMY N/A 04/04/2016   Procedure: EXPLORATORY LAPAROTOMY, PLACEMENT OF ABRA ABDOMINAL WALL  CLOSURE SET ;  Surgeon: Greer Pickerel, MD;  Location: [facility];  Service: General;  Laterality: N/A;  . LAPAROTOMY N/A 04/18/2016   Procedure: EXPLORATORY LAPAROTOMY;  Surgeon: Erroll Luna, MD;  Location: [facility];  Service: General;  Laterality: N/A;  . NECK SURGERY  1994   repair disk  . OMENTECTOMY N/A 03/28/2016   Procedure: OMENTECTOMY;  Surgeon: Judeth Horn, MD;  Location: [facility];  Service: General;  Laterality: N/A;  . ROBOTIC ASSISTED TOTAL HYSTERECTOMY WITH BILATERAL SALPINGO OOPHERECTOMY  11/18/2012   Procedure: ROBOTIC ASSISTED TOTAL HYSTERECTOMY WITH BILATERAL SALPINGO OOPHORECTOMY;  Surgeon: Imagene Gurney A. Alycia Rossetti, MD;  Location: [facility];  Service: Gynecology;  Laterality: N/A;  . TONSILLECTOMY     as a child  . VACUUM ASSISTED CLOSURE CHANGE N/A 03/30/2016   Procedure: ABDOMINAL VAC CHANGE;  Surgeon: Rolm Bookbinder, MD;  Location: [facility];  Service: General;  Laterality: N/A;  . WOUND DEBRIDEMENT N/A 03/28/2016   Procedure: DEBRIDEMENT WOUND;  Surgeon: Judeth Horn, MD;  Location: [facility];  Service: General;  Laterality: N/A;    Social History Social History  Substance Use Topics  . Smoking status: Current Every Day Smoker    Packs/day: 1.00    Years: 28.00    Types: Cigarettes  . Smokeless tobacco: Never Used  . Alcohol use 0.5 - 1.5 oz/week    1 - 3 Standard drinks or equivalent per week     Comment: "couple beers on the weekend"    Family History Family History  Problem Relation Age of Onset  . COPD Mother   . Diabetes Mother   . Congestive Heart Failure Mother   . Diabetes Father   . Heart attack Father   No family history of bleeding/clotting disorders, porphyria or autoimmune disease   Allergies  Allergen Reactions  . Penicillins Anaphylaxis, Hives, Shortness Of Breath, Swelling and Other (See Comments)    Has patient had a PCN reaction causing immediate rash, facial/tongue/throat swelling, SOB or lightheadedness with hypotension: No Has patient had a PCN reaction causing  severe rash involving mucus membranes or skin necrosis: No Has patient had a PCN reaction that required hospitalization No Has patient had a PCN reaction occurring within the last 10 years: No If all of the above answers are "NO", then may proceed with Cephalosporin use.     REVIEW OF SYSTEMS (Negative unless checked)  Constitutional: [] Weight loss  [] Fever  [] Chills Cardiac: [] Chest pain   [] Chest pressure   [] Palpitations   [] Shortness of breath when laying flat   [] Shortness of breath with exertion. Vascular:  [] Pain in legs with walking   [] Pain in legs at rest  [] History of DVT   [] Phlebitis   [x] Swelling in legs   [x] Varicose veins   [] Non-healing ulcers Pulmonary:   [] Uses home oxygen   [] Productive cough   [] Hemoptysis   [] Wheeze  [] COPD   [] Asthma Neurologic:  [] Dizziness   [] Seizures   [] History of stroke   [] History of  TIA  [] Aphasia   [] Vissual changes   [] Weakness or numbness in arm   [] Weakness or numbness in leg Musculoskeletal:   [] Joint swelling   [] Joint pain   [] Low back pain Hematologic:  [] Easy bruising  [] Easy bleeding   [] Hypercoagulable state   [] Anemic Gastrointestinal:  [] Diarrhea   [] Vomiting  [] Gastroesophageal reflux/heartburn   [] Difficulty swallowing. Genitourinary:  [] Chronic kidney disease   [] Difficult urination  [] Frequent urination   [] Blood in urine Skin:  [] Rashes   [] Ulcers  Psychological:  [] History of anxiety   []  History of major depression.  Physical Examination  Vitals:   02/21/17 1403  BP: (!) 172/84  Pulse: 80  Resp: 17  Weight: (!) 305 lb (138.3 kg)  Height: 5\' 7"  (1.702 m)   Body mass index is 47.77 kg/m. Gen: WD/WN, NAD Head: [place]/AT, No temporalis wasting.  Ear/Nose/Throat: Hearing grossly intact, nares w/o erythema or drainage, poor dentition Eyes: PER, EOMI, sclera nonicteric.  Neck: Supple, no masses.  No bruit or JVD.  Pulmonary:  Good air movement, clear to auscultation bilaterally, no use of accessory muscles.  Cardiac: RRR,  normal S1, S2, no Murmurs. Vascular: 2+ edema scattered varicosities bilateral 8 mm in size and tender to palpation  Mild venous skin changes Vessel Right Left  Radial Palpable Palpable  Ulnar Palpable Palpable  Brachial Palpable Palpable  Carotid Palpable Palpable  Femoral Palpable Palpable  Popliteal Palpable Palpable  PT Palpable Palpable  DP Palpable Palpable   Gastrointestinal: soft, non-distended. No guarding/no peritoneal signs.  Musculoskeletal: M/S 5/5 throughout.  No deformity or atrophy.  Neurologic: CN 2-12 intact. Pain and light touch intact in extremities.  Symmetrical.  Speech is fluent. Motor exam as listed above. Psychiatric: Judgment intact, Mood & affect appropriate for pt's clinical situation. Dermatologic: mild venous stasis changes noted.  No ulcers noted.  No changes consistent with cellulitis. Lymph : No Cervical lymphadenopathy, no lichenification or skin changes of chronic lymphedema.  CBC Lab Results  Component Value Date   WBC 9.9 06/05/2016   HGB 10.6 (L) 06/05/2016   HCT 35.4 (L) 06/05/2016   MCV 92.4 06/05/2016   PLT 363 06/05/2016    BMET    Component Value Date/Time   NA 133 (L) 06/05/2016 1320   K 3.4 (L) 06/05/2016 1320   CL 98 (L) 06/05/2016 1320   CO2 27 06/05/2016 1320   GLUCOSE 95 06/05/2016 1320   BUN 14 06/05/2016 1320   CREATININE 1.01 (H) 06/05/2016 1320   CALCIUM 9.0 06/05/2016 1320   GFRNONAA 58 (L) 06/05/2016 1320   GFRAA >60 06/05/2016 1320   CrCl cannot be calculated (Patient's most recent lab result is older than the maximum 21 days allowed.).  COAG No results found for: INR, PROTIME  Radiology No results found.  Assessment/Plan 1. Chronic venous insufficiency I have had a long discussion with the patient regarding swelling and why it  causes symptoms.  Patient will begin wearing graduated compression stockings class 1 (20-30 mmHg) on a daily basis a prescription was given. The patient will  beginning wearing the  stockings first thing in the morning and removing them in the evening. The patient is instructed specifically not to sleep in the stockings.   In addition, behavioral modification will be initiated.  This will include frequent elevation, use of over the counter pain medications and exercise such as walking.  I have reviewed systemic causes for chronic edema such as liver, kidney and cardiac etiologies.  The patient denies problems with these organ  systems.    Consideration for a lymph pump will also be made based upon the effectiveness of conservative therapy.  This would help to improve the edema control and prevent sequela such as ulcers and infections   Patient should undergo duplex ultrasound of the venous system to ensure that DVT or reflux is not present.  The patient will follow-up with me after the ultrasound.    2. Lymphedema See #1  3. Essential hypertension Continue antihypertensive medications as already ordered, these medications have been reviewed and there are no changes at this time.   4. Chronic obstructive pulmonary disease, unspecified COPD type ([place]) Continue pulmonary medications and aerosols as already ordered, these medications have been reviewed and there are no changes at this time.  5. OSA (obstructive sleep apnea) Continue CPAP nightly and aerosol therapy as already ordered, these medications have been reviewed and there are no changes at this time.     Hortencia Pilar, MD  02/21/2017 9:30 PM

## 2017-05-30 ENCOUNTER — Ambulatory Visit (INDEPENDENT_AMBULATORY_CARE_PROVIDER_SITE_OTHER): Payer: BLUE CROSS/BLUE SHIELD | Admitting: Vascular Surgery

## 2017-05-30 ENCOUNTER — Encounter (INDEPENDENT_AMBULATORY_CARE_PROVIDER_SITE_OTHER): Payer: BLUE CROSS/BLUE SHIELD

## 2017-06-13 ENCOUNTER — Ambulatory Visit (INDEPENDENT_AMBULATORY_CARE_PROVIDER_SITE_OTHER): Payer: BLUE CROSS/BLUE SHIELD | Admitting: Vascular Surgery

## 2017-06-13 ENCOUNTER — Ambulatory Visit (INDEPENDENT_AMBULATORY_CARE_PROVIDER_SITE_OTHER): Payer: BLUE CROSS/BLUE SHIELD

## 2017-06-13 ENCOUNTER — Encounter (INDEPENDENT_AMBULATORY_CARE_PROVIDER_SITE_OTHER): Payer: Self-pay | Admitting: Vascular Surgery

## 2017-06-13 VITALS — BP 143/83 | HR 78 | Resp 16 | Wt 306.0 lb

## 2017-06-13 DIAGNOSIS — I890 Lymphedema, not elsewhere classified: Secondary | ICD-10-CM

## 2017-06-13 DIAGNOSIS — I872 Venous insufficiency (chronic) (peripheral): Secondary | ICD-10-CM

## 2017-06-13 NOTE — Progress Notes (Signed)
Subjective:    Patient ID: Christina Castaneda, female    DOB: Jan 16, 1956, 61 y.o.   MRN: [id number] Chief Complaint  Patient presents with  . Follow-up   Patient presents to review vascular studies. She was last seen on 02/21/2017 in regard to evaluation for lower extremity swelling. Since her initial visit the patient has been engaging in medical grade 1 compression stockings, elevating her legs and remaining as active as possible. The patient has experienced minimal improvement in her symptoms. Her symptoms have progressed to the point that she is unable to function on daily basis. The patient underwent a bilateral lower extremity venous reflux exam which was notable for no deep vein thrombosis, no superficial thrombophlebitis and incompetence of the right greater saphenous vein area.    Review of Systems  Constitutional: Negative.   HENT: Negative.   Eyes: Negative.   Respiratory: Negative.   Cardiovascular: Positive for leg swelling.       Lower extremity pain  Endocrine: Negative.   Genitourinary: Negative.   Musculoskeletal: Negative.   Skin: Negative.   Allergic/Immunologic: Negative.   Neurological: Negative.   Hematological: Negative.   Psychiatric/Behavioral: Negative.       Objective:   Physical Exam  Constitutional: She is oriented to person, place, and time. She appears well-developed and well-nourished. No distress.  HENT:  Head: Normocephalic and atraumatic.  Eyes: Conjunctivae are normal. Pupils are equal, round, and reactive to light.  Neck: Normal range of motion.  Cardiovascular: Normal rate, regular rhythm, normal heart sounds and intact distal pulses.   Pulses:      Radial pulses are 2+ on the right side, and 2+ on the left side.  Unable to palpate pedal pulses due to body habitus and edema  Pulmonary/Chest: Effort normal.  Musculoskeletal: Normal range of motion. She exhibits edema (Moderate edema noted bilaterally).  Neurological: She is alert and  oriented to person, place, and time.  Skin: Skin is warm and dry. She is not diaphoretic.  Psychiatric: She has a normal mood and affect. Her behavior is normal. Judgment and thought content normal.  Vitals reviewed.   BP (!) 143/83   Pulse 78   Resp 16   Wt (!) 306 lb (138.8 kg)   LMP 11/18/2012   BMI 47.93 kg/m   Past Medical History:  Diagnosis Date  . Asthma    mild  . Atypical endometrial hyperplasia   . COPD (chronic obstructive pulmonary disease) ([place])   . Goiter   . Gout   . Heart murmur   . Hypertension   . Hypothyroidism   . Vitamin B12 deficiency   . Yeast infection    for last 3 weeks    Social History   Social History  . Marital status: Married    Spouse name: N/A  . Number of children: N/A  . Years of education: N/A   Occupational History  . Not on file.   Social History Main Topics  . Smoking status: Current Every Day Smoker    Packs/day: 1.00    Years: 28.00    Types: Cigarettes  . Smokeless tobacco: Never Used  . Alcohol use 0.5 - 1.5 oz/week    1 - 3 Standard drinks or equivalent per week     Comment: "couple beers on the weekend"  . Drug use: No  . Sexual activity: Yes    Partners: Male    Birth control/ protection: Surgical     Comment: Hysterectomy   Other Topics Concern  .  Not on file   Social History Narrative  . No narrative on file    Past Surgical History:  Procedure Laterality Date  . ABDOMINAL WALL DEFECT REPAIR N/A 04/18/2016   Procedure: CLOSURE OF ABDOMEN;  Surgeon: Erroll Luna, MD;  Location: [facility];  Service: General;  Laterality: N/A;  . APPLICATION OF WOUND VAC N/A 04/02/2016   Procedure: application of wound vac+;  Surgeon: Greer Pickerel, MD;  Location: [facility];  Service: General;  Laterality: N/A;  . CHOLECYSTECTOMY  2009   gallstone removed  . COLOSTOMY N/A 03/30/2016   Procedure: COLOSTOMY;  Surgeon: Rolm Bookbinder, MD;  Location: [facility];  Service: General;  Laterality: N/A;  . COLOSTOMY REVISION N/A 03/28/2016     Procedure: COLOSTOMY REVISION;  Surgeon: Judeth Horn, MD;  Location: [facility];  Service: General;  Laterality: N/A;  . COLOSTOMY REVISION N/A 03/30/2016   Procedure: COLON RESECTION LEFT;  Surgeon: Rolm Bookbinder, MD;  Location: [facility];  Service: General;  Laterality: N/A;  . LAPAROTOMY N/A 03/28/2016   Procedure: EXPLORATORY LAPAROTOMY;  Surgeon: Judeth Horn, MD;  Location: [facility];  Service: General;  Laterality: N/A;  . LAPAROTOMY N/A 04/02/2016   Procedure: Re-exploration of open abdomen, application of abdominal wound vac;  Surgeon: Greer Pickerel, MD;  Location: [facility];  Service: General;  Laterality: N/A;  . LAPAROTOMY N/A 04/04/2016   Procedure: EXPLORATORY LAPAROTOMY, PLACEMENT OF ABRA ABDOMINAL WALL CLOSURE SET ;  Surgeon: Greer Pickerel, MD;  Location: [facility];  Service: General;  Laterality: N/A;  . LAPAROTOMY N/A 04/18/2016   Procedure: EXPLORATORY LAPAROTOMY;  Surgeon: Erroll Luna, MD;  Location: [facility];  Service: General;  Laterality: N/A;  . NECK SURGERY  1994   repair disk  . OMENTECTOMY N/A 03/28/2016   Procedure: OMENTECTOMY;  Surgeon: Judeth Horn, MD;  Location: [facility];  Service: General;  Laterality: N/A;  . ROBOTIC ASSISTED TOTAL HYSTERECTOMY WITH BILATERAL SALPINGO OOPHERECTOMY  11/18/2012   Procedure: ROBOTIC ASSISTED TOTAL HYSTERECTOMY WITH BILATERAL SALPINGO OOPHORECTOMY;  Surgeon: Imagene Gurney A. Alycia Rossetti, MD;  Location: [facility];  Service: Gynecology;  Laterality: N/A;  . TONSILLECTOMY     as a child  . VACUUM ASSISTED CLOSURE CHANGE N/A 03/30/2016   Procedure: ABDOMINAL VAC CHANGE;  Surgeon: Rolm Bookbinder, MD;  Location: [facility];  Service: General;  Laterality: N/A;  . WOUND DEBRIDEMENT N/A 03/28/2016   Procedure: DEBRIDEMENT WOUND;  Surgeon: Judeth Horn, MD;  Location: [facility];  Service: General;  Laterality: N/A;    Family History  Problem Relation Age of Onset  . COPD Mother   . Diabetes Mother   . Congestive Heart Failure Mother   . Diabetes Father   . Heart attack Father      Allergies  Allergen Reactions  . Penicillins Anaphylaxis, Hives, Shortness Of Breath, Swelling and Other (See Comments)    Has patient had a PCN reaction causing immediate rash, facial/tongue/throat swelling, SOB or lightheadedness with hypotension: No Has patient had a PCN reaction causing severe rash involving mucus membranes or skin necrosis: No Has patient had a PCN reaction that required hospitalization No Has patient had a PCN reaction occurring within the last 10 years: No If all of the above answers are "NO", then may proceed with Cephalosporin use.       Assessment & Plan:  Patient presents to review vascular studies. She was last seen on 02/21/2017 in regard to evaluation for lower extremity swelling. Since her initial visit the patient has been engaging in medical grade 1  compression stockings, elevating her legs and remaining as active as possible. The patient has experienced minimal improvement in her symptoms. Her symptoms have progressed to the point that she is unable to function on daily basis. The patient underwent a bilateral lower extremity venous reflux exam which was notable for no deep vein thrombosis, no superficial thrombophlebitis and incompetence of the right greater saphenous vein area.   1. Chronic venous insufficiency - New Patient found to have venous reflux located in the right greater saphenous vein  2. Lymphedema - new Despite conservative treatment including exercise, elevation and class I compression stockings the patient still presents with stage I lymphedema The patient has failed conservative therapy for over 3 months The patient will greatly benefit from the addition of a lymphedema pump Will apply Patient to follow-up in 2 months to assess progress  Current Outpatient Prescriptions on File Prior to Visit  Medication Sig Dispense Refill  . ADVAIR DISKUS 250-50 MCG/DOSE AEPB Inhale 1 puff into the lungs 2 (two) times daily. Once in the am & once  at pm    . albuterol (PROVENTIL HFA;VENTOLIN HFA) 108 (90 BASE) MCG/ACT inhaler Inhale 2 puffs into the lungs every 6 (six) hours as needed. Wheezing and shortness of breath    . allopurinol (ZYLOPRIM) 100 MG tablet Take 1 tablet (100 mg total) by mouth daily. 30 tablet 4  . ergocalciferol (VITAMIN D2) 50000 UNITS capsule Take 50,000 Units by mouth once a week.    . furosemide (LASIX) 20 MG tablet Take 20 mg by mouth daily.  3  . gabapentin (NEURONTIN) 100 MG capsule Take by mouth.    . hydrochlorothiazide (HYDRODIURIL) 25 MG tablet Take 1 tablet (25 mg total) by mouth daily. 30 tablet 2  . levothyroxine (SYNTHROID, LEVOTHROID) 200 MCG tablet Take 200 mcg by mouth daily before breakfast.     . senna-docusate (SENOKOT-S) 8.6-50 MG tablet Take 2 tablets by mouth 3 (three) times daily before meals. 100 tablet 0  . ferrous sulfate 325 (65 FE) MG tablet TAKE 1 TABLET (325 MG TOTAL) BY MOUTH 2 (TWO) TIMES DAILY WITH A MEAL. (Patient not taking: Reported on 06/13/2017) 60 tablet 0  . lisinopril (PRINIVIL,ZESTRIL) 20 MG tablet Take 20 mg by mouth daily.  2  . pantoprazole (PROTONIX) 40 MG tablet TAKE 1 TABLET (40 MG TOTAL) BY MOUTH DAILY. (Patient not taking: Reported on 10/10/2016) 30 tablet 0  . traZODone (DESYREL) 50 MG tablet TAKE 1 TABLET (50 MG TOTAL) BY MOUTH AT BEDTIME AS NEEDED FOR SLEEP. (Patient not taking: Reported on 06/13/2017) 30 tablet 0   No current facility-administered medications on file prior to visit.     There are no Patient Instructions on file for this visit. No Follow-up on file.   Ryman Rathgeber A Gerald Kuehl, PA-C

## 2017-08-14 NOTE — Progress Notes (Signed)
MRN : [id number]  Christina Castaneda is a 62 y.o. (Mar 30, 1956) female who presents with chief complaint of No chief complaint on file. Marland Kitchen  History of Present Illness: The patient returns to the office for followup evaluation regarding leg swelling.  The swelling has persisted but with the lymph pump is much, much better controlled. The pain associated with swelling is essentially eliminated. There have not been any interval development of a ulcerations or wounds.  The patient denies problems with the pump, noting it is working well and the leggings are in good condition.  Since the previous visit the patient has been wearing graduated compression stockings and using the lymph pump on a routine basis and  has noted significant improvement in the lymphedema.   Patient stated the lymph pump has been a very positive factor in her care.    No outpatient prescriptions have been marked as taking for the 08/15/17 encounter (Appointment) with Delana Meyer, Dolores Lory, MD.    Past Medical History:  Diagnosis Date  . Asthma    mild  . Atypical endometrial hyperplasia   . COPD (chronic obstructive pulmonary disease) ([place])   . Goiter   . Gout   . Heart murmur   . Hypertension   . Hypothyroidism   . Vitamin B12 deficiency   . Yeast infection    for last 3 weeks    Past Surgical History:  Procedure Laterality Date  . ABDOMINAL WALL DEFECT REPAIR N/A 04/18/2016   Procedure: CLOSURE OF ABDOMEN;  Surgeon: Erroll Luna, MD;  Location: [place];  Service: General;  Laterality: N/A;  . APPLICATION OF WOUND VAC N/A 04/02/2016   Procedure: application of wound vac+;  Surgeon: Greer Pickerel, MD;  Location: [place];  Service: General;  Laterality: N/A;  . CHOLECYSTECTOMY  2009   gallstone removed  . COLOSTOMY N/A 03/30/2016   Procedure: COLOSTOMY;  Surgeon: Rolm Bookbinder, MD;  Location: [place];  Service: General;  Laterality: N/A;  . COLOSTOMY REVISION N/A 03/28/2016   Procedure: COLOSTOMY REVISION;  Surgeon:  Judeth Horn, MD;  Location: [place];  Service: General;  Laterality: N/A;  . COLOSTOMY REVISION N/A 03/30/2016   Procedure: COLON RESECTION LEFT;  Surgeon: Rolm Bookbinder, MD;  Location: [place];  Service: General;  Laterality: N/A;  . LAPAROTOMY N/A 03/28/2016   Procedure: EXPLORATORY LAPAROTOMY;  Surgeon: Judeth Horn, MD;  Location: [place];  Service: General;  Laterality: N/A;  . LAPAROTOMY N/A 04/02/2016   Procedure: Re-exploration of open abdomen, application of abdominal wound vac;  Surgeon: Greer Pickerel, MD;  Location: [place];  Service: General;  Laterality: N/A;  . LAPAROTOMY N/A 04/04/2016   Procedure: EXPLORATORY LAPAROTOMY, PLACEMENT OF ABRA ABDOMINAL WALL CLOSURE SET ;  Surgeon: Greer Pickerel, MD;  Location: [place];  Service: General;  Laterality: N/A;  . LAPAROTOMY N/A 04/18/2016   Procedure: EXPLORATORY LAPAROTOMY;  Surgeon: Erroll Luna, MD;  Location: [place];  Service: General;  Laterality: N/A;  . NECK SURGERY  1994   repair disk  . OMENTECTOMY N/A 03/28/2016   Procedure: OMENTECTOMY;  Surgeon: Judeth Horn, MD;  Location: [place];  Service: General;  Laterality: N/A;  . ROBOTIC ASSISTED TOTAL HYSTERECTOMY WITH BILATERAL SALPINGO OOPHERECTOMY  11/18/2012   Procedure: ROBOTIC ASSISTED TOTAL HYSTERECTOMY WITH BILATERAL SALPINGO OOPHORECTOMY;  Surgeon: Imagene Gurney A. Alycia Rossetti, MD;  Location: [facility];  Service: Gynecology;  Laterality: N/A;  . TONSILLECTOMY     as a child  . VACUUM ASSISTED CLOSURE CHANGE N/A 03/30/2016  Procedure: ABDOMINAL VAC CHANGE;  Surgeon: Rolm Bookbinder, MD;  Location: [place];  Service: General;  Laterality: N/A;  . WOUND DEBRIDEMENT N/A 03/28/2016   Procedure: DEBRIDEMENT WOUND;  Surgeon: Judeth Horn, MD;  Location: [place];  Service: General;  Laterality: N/A;    Social History Social History  Substance Use Topics  . Smoking status: Current Every Day Smoker    Packs/day: 1.00    Years: 28.00    Types: Cigarettes  . Smokeless tobacco: Never Used  . Alcohol use 0.5 - 1.5  oz/week    1 - 3 Standard drinks or equivalent per week     Comment: "couple beers on the weekend"    Family History Family History  Problem Relation Age of Onset  . COPD Mother   . Diabetes Mother   . Congestive Heart Failure Mother   . Diabetes Father   . Heart attack Father     Allergies  Allergen Reactions  . Penicillins Anaphylaxis, Hives, Shortness Of Breath, Swelling and Other (See Comments)    Has patient had a PCN reaction causing immediate rash, facial/tongue/throat swelling, SOB or lightheadedness with hypotension: No Has patient had a PCN reaction causing severe rash involving mucus membranes or skin necrosis: No Has patient had a PCN reaction that required hospitalization No Has patient had a PCN reaction occurring within the last 10 years: No If all of the above answers are "NO", then may proceed with Cephalosporin use.     REVIEW OF SYSTEMS (Negative unless checked)  Constitutional: [] Weight loss  [] Fever  [] Chills Cardiac: [] Chest pain   [] Chest pressure   [] Palpitations   [] Shortness of breath when laying flat   [] Shortness of breath with exertion. Vascular:  [] Pain in legs with walking   [x] Pain in legs with standing  [] History of DVT   [] Phlebitis   [x] Swelling in legs   [] Varicose veins   [] Non-healing ulcers Pulmonary:   [] Uses home oxygen   [] Productive cough   [] Hemoptysis   [] Wheeze  [] COPD   [] Asthma Neurologic:  [] Dizziness   [] Seizures   [] History of stroke   [] History of TIA  [] Aphasia   [] Vissual changes   [] Weakness or numbness in arm   [] Weakness or numbness in leg Musculoskeletal:   [] Joint swelling   [] Joint pain   [] Low back pain Hematologic:  [] Easy bruising  [] Easy bleeding   [] Hypercoagulable state   [] Anemic Gastrointestinal:  [] Diarrhea   [] Vomiting  [] Gastroesophageal reflux/heartburn   [] Difficulty swallowing. Genitourinary:  [] Chronic kidney disease   [] Difficult urination  [] Frequent urination   [] Blood in urine Skin:  [] Rashes    [] Ulcers  Psychological:  [] History of anxiety   []  History of major depression.  Physical Examination  There were no vitals filed for this visit. There is no height or weight on file to calculate BMI. Gen: WD/WN, NAD Head: [place]/AT, No temporalis wasting.  Ear/Nose/Throat: Hearing grossly intact, nares w/o erythema or drainage Eyes: PER, EOMI, sclera nonicteric.  Neck: Supple, no large masses.   Pulmonary:  Good air movement, no audible wheezing bilaterally, no use of accessory muscles.  Cardiac: RRR, no JVD Vascular:  4+ lymphedema bilaterally Vessel Right Left  Radial Palpable Palpable  PT Palpable Palpable  DP Palpable Palpable  Gastrointestinal: Non-distended. No guarding/no peritoneal signs.  Musculoskeletal: M/S 5/5 throughout.  No deformity or atrophy.  Neurologic: CN 2-12 intact. Symmetrical.  Speech is fluent. Motor exam as listed above. Psychiatric: Judgment intact, Mood & affect appropriate for pt's clinical situation. Dermatologic: +  rashes no ulcers noted.  No changes consistent with cellulitis. Lymph : + lichenification and skin changes of chronic lymphedema.  CBC Lab Results  Component Value Date   WBC 9.9 06/05/2016   HGB 10.6 (L) 06/05/2016   HCT 35.4 (L) 06/05/2016   MCV 92.4 06/05/2016   PLT 363 06/05/2016    BMET    Component Value Date/Time   NA 133 (L) 06/05/2016 1320   K 3.4 (L) 06/05/2016 1320   CL 98 (L) 06/05/2016 1320   CO2 27 06/05/2016 1320   GLUCOSE 95 06/05/2016 1320   BUN 14 06/05/2016 1320   CREATININE 1.01 (H) 06/05/2016 1320   CALCIUM 9.0 06/05/2016 1320   GFRNONAA 58 (L) 06/05/2016 1320   GFRAA >60 06/05/2016 1320   CrCl cannot be calculated (Patient's most recent lab result is older than the maximum 21 days allowed.).  COAG No results found for: INR, PROTIME  Radiology No results found.  Assessment/Plan 1. Chronic venous insufficiency  No surgery or intervention at this point in time.    I have reviewed my discussion  with the patient regarding lymphedema and why it  causes symptoms.  Patient will continue wearing graduated compression stockings class 1 (20-30 mmHg) on a daily basis a prescription was given. The patient is reminded to put the stockings on first thing in the morning and removing them in the evening. The patient is instructed specifically not to sleep in the stockings.   In addition, behavioral modification throughout the day will be continued.  This will include frequent elevation (such as in a recliner), use of over the counter pain medications as needed and exercise such as walking.  I have reviewed systemic causes for chronic edema such as liver, kidney and cardiac etiologies and there does not appear to be any significant changes in these organ systems over the past year.  The patient is under the impression that these organ systems are all stable and unchanged.    The patient will continue aggressive use of the  lymph pump.  This will continue to improve the edema control and prevent sequela such as ulcers and infections.   The patient will follow-up with me on an annual basis.    2. Lymphedema  No surgery or intervention at this point in time.    I have reviewed my discussion with the patient regarding lymphedema and why it  causes symptoms.  Patient will continue wearing graduated compression stockings class 1 (20-30 mmHg) on a daily basis a prescription was given. The patient is reminded to put the stockings on first thing in the morning and removing them in the evening. The patient is instructed specifically not to sleep in the stockings.   In addition, behavioral modification throughout the day will be continued.  This will include frequent elevation (such as in a recliner), use of over the counter pain medications as needed and exercise such as walking.  I have reviewed systemic causes for chronic edema such as liver, kidney and cardiac etiologies and there does not appear to be any  significant changes in these organ systems over the past year.  The patient is under the impression that these organ systems are all stable and unchanged.    The patient will continue aggressive use of the  lymph pump.  This will continue to improve the edema control and prevent sequela such as ulcers and infections.   The patient will follow-up with me on an annual basis.    3. Essential  hypertension Continue antihypertensive medications as already ordered, these medications have been reviewed and there are no changes at this time.   4. Chronic obstructive pulmonary disease, unspecified COPD type ([place]) Continue pulmonary medications and aerosols as already ordered, these medications have been reviewed and there are no changes at this time.      Hortencia Pilar, MD  08/14/2017 10:01 PM

## 2017-08-15 ENCOUNTER — Encounter (INDEPENDENT_AMBULATORY_CARE_PROVIDER_SITE_OTHER): Payer: Self-pay | Admitting: Vascular Surgery

## 2017-08-15 ENCOUNTER — Ambulatory Visit (INDEPENDENT_AMBULATORY_CARE_PROVIDER_SITE_OTHER): Payer: BLUE CROSS/BLUE SHIELD | Admitting: Vascular Surgery

## 2017-08-15 DIAGNOSIS — I872 Venous insufficiency (chronic) (peripheral): Secondary | ICD-10-CM

## 2017-08-15 DIAGNOSIS — J449 Chronic obstructive pulmonary disease, unspecified: Secondary | ICD-10-CM

## 2017-08-15 DIAGNOSIS — I10 Essential (primary) hypertension: Secondary | ICD-10-CM

## 2017-08-15 DIAGNOSIS — I890 Lymphedema, not elsewhere classified: Secondary | ICD-10-CM

## 2018-08-18 ENCOUNTER — Ambulatory Visit (INDEPENDENT_AMBULATORY_CARE_PROVIDER_SITE_OTHER): Payer: BLUE CROSS/BLUE SHIELD | Admitting: Vascular Surgery

## 2020-05-26 ENCOUNTER — Other Ambulatory Visit (HOSPITAL_COMMUNITY): Payer: Self-pay | Admitting: Family Medicine

## 2020-05-26 DIAGNOSIS — Z13820 Encounter for screening for osteoporosis: Secondary | ICD-10-CM

## 2020-05-26 DIAGNOSIS — Z1239 Encounter for other screening for malignant neoplasm of breast: Secondary | ICD-10-CM

## 2020-07-18 ENCOUNTER — Other Ambulatory Visit (HOSPITAL_COMMUNITY): Payer: Self-pay

## 2020-07-18 ENCOUNTER — Ambulatory Visit (HOSPITAL_COMMUNITY): Payer: Self-pay

## 2020-09-05 ENCOUNTER — Ambulatory Visit (HOSPITAL_COMMUNITY)
Admission: RE | Admit: 2020-09-05 | Discharge: 2020-09-05 | Disposition: A | Discharge status: Home / Self Care | Payer: Medicare Other | Source: Ambulatory Visit | Attending: Family Medicine | Admitting: Family Medicine

## 2020-09-05 ENCOUNTER — Ambulatory Visit (HOSPITAL_COMMUNITY): Payer: Self-pay

## 2020-09-05 ENCOUNTER — Other Ambulatory Visit: Payer: Self-pay

## 2020-09-05 ENCOUNTER — Other Ambulatory Visit (HOSPITAL_COMMUNITY): Payer: Self-pay

## 2020-09-05 DIAGNOSIS — Z1231 Encounter for screening mammogram for malignant neoplasm of breast: Secondary | ICD-10-CM | POA: Insufficient documentation

## 2020-09-05 DIAGNOSIS — Z780 Asymptomatic menopausal state: Secondary | ICD-10-CM | POA: Diagnosis not present

## 2020-09-05 DIAGNOSIS — Z13820 Encounter for screening for osteoporosis: Secondary | ICD-10-CM

## 2020-09-05 DIAGNOSIS — M85851 Other specified disorders of bone density and structure, right thigh: Secondary | ICD-10-CM | POA: Insufficient documentation

## 2020-09-05 DIAGNOSIS — Z1239 Encounter for other screening for malignant neoplasm of breast: Secondary | ICD-10-CM

## 2020-09-13 LAB — COLOGUARD: COLOGUARD: NEGATIVE

## 2021-04-14 ENCOUNTER — Emergency Department (HOSPITAL_COMMUNITY)
Admission: EM | Admit: 2021-04-14 | Discharge: 2021-04-14 | Disposition: A | Discharge status: Home / Self Care | Payer: Medicare Other | Attending: Emergency Medicine | Admitting: Emergency Medicine

## 2021-04-14 ENCOUNTER — Emergency Department (HOSPITAL_COMMUNITY): Payer: Medicare Other

## 2021-04-14 ENCOUNTER — Encounter (HOSPITAL_COMMUNITY): Payer: Self-pay | Admitting: *Deleted

## 2021-04-14 ENCOUNTER — Other Ambulatory Visit: Payer: Self-pay

## 2021-04-14 DIAGNOSIS — J4520 Mild intermittent asthma, uncomplicated: Secondary | ICD-10-CM | POA: Diagnosis not present

## 2021-04-14 DIAGNOSIS — J449 Chronic obstructive pulmonary disease, unspecified: Secondary | ICD-10-CM | POA: Diagnosis not present

## 2021-04-14 DIAGNOSIS — E039 Hypothyroidism, unspecified: Secondary | ICD-10-CM | POA: Insufficient documentation

## 2021-04-14 DIAGNOSIS — M25512 Pain in left shoulder: Secondary | ICD-10-CM

## 2021-04-14 DIAGNOSIS — Z79899 Other long term (current) drug therapy: Secondary | ICD-10-CM | POA: Insufficient documentation

## 2021-04-14 DIAGNOSIS — F17210 Nicotine dependence, cigarettes, uncomplicated: Secondary | ICD-10-CM | POA: Diagnosis not present

## 2021-04-14 DIAGNOSIS — I10 Essential (primary) hypertension: Secondary | ICD-10-CM | POA: Diagnosis not present

## 2021-04-14 DIAGNOSIS — Z7951 Long term (current) use of inhaled steroids: Secondary | ICD-10-CM | POA: Diagnosis not present

## 2021-04-14 LAB — CBC WITH DIFFERENTIAL/PLATELET
Abs Immature Granulocytes: 0.07 10*3/uL (ref 0.00–0.07)
Basophils Absolute: 0 10*3/uL (ref 0.0–0.1)
Basophils Relative: 0 %
Eosinophils Absolute: 0.2 10*3/uL (ref 0.0–0.5)
Eosinophils Relative: 1 %
HCT: 44.3 % (ref 36.0–46.0)
Hemoglobin: 14 g/dL (ref 12.0–15.0)
Immature Granulocytes: 1 %
Lymphocytes Relative: 13 %
Lymphs Abs: 1.8 10*3/uL (ref 0.7–4.0)
MCH: 32.6 pg (ref 26.0–34.0)
MCHC: 31.6 g/dL (ref 30.0–36.0)
MCV: 103.3 fL — ABNORMAL HIGH (ref 80.0–100.0)
Monocytes Absolute: 0.8 10*3/uL (ref 0.1–1.0)
Monocytes Relative: 6 %
Neutro Abs: 11 10*3/uL — ABNORMAL HIGH (ref 1.7–7.7)
Neutrophils Relative %: 79 %
Platelets: 345 10*3/uL (ref 150–400)
RBC: 4.29 MIL/uL (ref 3.87–5.11)
RDW: 14.4 % (ref 11.5–15.5)
WBC: 13.8 10*3/uL — ABNORMAL HIGH (ref 4.0–10.5)
nRBC: 0 % (ref 0.0–0.2)

## 2021-04-14 LAB — BASIC METABOLIC PANEL
Anion gap: 8 (ref 5–15)
BUN: 14 mg/dL (ref 8–23)
CO2: 20 mmol/L — ABNORMAL LOW (ref 22–32)
Calcium: 9.3 mg/dL (ref 8.9–10.3)
Chloride: 106 mmol/L (ref 98–111)
Creatinine, Ser: 1.02 mg/dL — ABNORMAL HIGH (ref 0.44–1.00)
GFR, Estimated: >60 mL/min (ref >60)
Glucose, Bld: 89 mg/dL (ref 70–99)
Potassium: 4.6 mmol/L (ref 3.5–5.1)
Sodium: 134 mmol/L — ABNORMAL LOW (ref 135–145)

## 2021-04-14 LAB — TROPONIN I (HIGH SENSITIVITY)
Troponin I (High Sensitivity): 16 ng/L (ref <18)
Troponin I (High Sensitivity): 14 ng/L (ref <18)

## 2021-04-14 MED ORDER — MORPHINE SULFATE (PF) 4 MG/ML IV SOLN
4.0000 mg | INTRAVENOUS | Status: DC | PRN
  Administered 2021-04-14: 4 mg via INTRAVENOUS
  Filled 2021-04-14: qty 1

## 2021-04-14 MED ORDER — NAPROXEN 500 MG PO TABS
500.0000 mg | ORAL_TABLET | Freq: Two times a day (BID) | ORAL | 0 refills | Status: AC
Start: 2021-04-14 — End: 2021-04-19

## 2021-04-14 MED ORDER — MORPHINE SULFATE (PF) 2 MG/ML IV SOLN
2.0000 mg | Freq: Once | INTRAVENOUS | Status: AC
  Administered 2021-04-14: 2 mg via INTRAVENOUS
  Filled 2021-04-14: qty 1

## 2021-04-14 MED ORDER — ONDANSETRON HCL 4 MG/2ML IJ SOLN
4.0000 mg | Freq: Once | INTRAMUSCULAR | Status: AC
  Administered 2021-04-14: 4 mg via INTRAVENOUS
  Filled 2021-04-14: qty 2

## 2021-04-14 NOTE — ED Notes (Signed)
Pt placed on bedpan, pt urinated all over bed- total linen change- purewick placed on pt.

## 2021-04-14 NOTE — Discharge Instructions (Addendum)
Call your primary care doctor or specialist as discussed in the next 2-3 days.   Return immediately back to the ER if:  Your symptoms worsen within the next 12-24 hours. You develop new symptoms such as new fevers, persistent vomiting, new pain, shortness of breath, or new weakness or numbness, or if you have any other concerns.  

## 2021-04-14 NOTE — ED Triage Notes (Signed)
Pt brought in by [facility] with c/o left side of neck, shoulder blade and left arm pain and numbness that started today. Denies injury.

## 2021-04-14 NOTE — ED Provider Notes (Signed)
[facility] Provider Note   CSN: [id number] Arrival date & time: 04/14/21  1817     History No chief complaint on file.   Christina Castaneda is a 65 y.o. female.  Presents complaining of left shoulder pain.  Triage notation states neck pain, however when I speak with the patient she states that she has no neck pain at rest.  She has left shoulder pain and when she moves her left shoulder certain way the pain radiates to her neck and to her back.  She describes her shoulder pain is sharp improved at rest and worse when she tries to move her left shoulder.  She states she had intermittent left shoulder pain for the past week or so but it acutely worsened last 2 days.  Denies any fall or trauma.  Denies any fevers or cough or vomiting or diarrhea.        Past Medical History:  Diagnosis Date  . Asthma    mild  . Atypical endometrial hyperplasia   . COPD (chronic obstructive pulmonary disease) ([place])   . Goiter   . Gout   . Heart murmur   . Hypertension   . Hypothyroidism   . Vitamin B12 deficiency   . Yeast infection    for last 3 weeks    Patient Active Problem List   Diagnosis Date Noted  . Chronic venous insufficiency 02/21/2017  . Lymphedema 02/21/2017  . 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 Adjustment disorder with mixed anxiety and depressed mood   . Debility 05/02/2016  . Edema   . Generalized weakness   . Chronic pain syndrome   . Hypoalbuminemia due to protein-calorie malnutrition ([place])   . OSA (obstructive sleep apnea)   . Morbid obesity due to excess calories ([place])   . S/P colostomy ([place])   . Tracheostomy status ([place])   . Tracheostomy care ([place])   . Chronic obstructive pulmonary disease ([place])   . Tobacco abuse   . Essential hypertension   . Bilateral knee pain   . Dysphagia   . Acute blood loss anemia   . Hyponatremia   . Thyroid activity decreased   . Acute respiratory failure ([place])   . Pressure ulcer 04/01/2016  . 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 Ventilator dependence ([place]) post exp lap with open abd 03/29/2016  . Hypothyroid 03/29/2016  . Morbid obesity ([place]) 03/29/2016  . Open wound of abdomen 03/29/2016  . Infection of colostomy stoma ([place]) 03/28/2016  . 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 Obesity 11/12/2012    Past Surgical History:  Procedure Laterality Date  . ABDOMINAL WALL DEFECT REPAIR N/A 04/18/2016   Procedure: CLOSURE OF ABDOMEN;  Surgeon: Erroll Luna, MD;  Location: [facility];  Service: General;  Laterality: N/A;  . APPENDECTOMY    . APPLICATION OF WOUND VAC N/A 04/02/2016   Procedure: application of wound vac+;  Surgeon: Greer Pickerel, MD;  Location: [facility];  Service: General;  Laterality: N/A;  . CHOLECYSTECTOMY  2009   gallstone removed  . COLOSTOMY N/A 03/30/2016   Procedure: COLOSTOMY;  Surgeon: Rolm Bookbinder, MD;  Location: [facility];  Service: General;  Laterality: N/A;  . COLOSTOMY REVISION N/A 03/28/2016   Procedure: COLOSTOMY REVISION;  Surgeon: Judeth Horn, MD;  Location: [facility];  Service: General;  Laterality: N/A;  . COLOSTOMY REVISION N/A 03/30/2016   Procedure: COLON RESECTION LEFT;  Surgeon: Rolm Bookbinder, MD;  Location: [facility];  Service: General;  Laterality: N/A;  . LAPAROTOMY N/A 03/28/2016  Procedure: EXPLORATORY LAPAROTOMY;  Surgeon: Judeth Horn, MD;  Location: [facility];  Service: General;  Laterality: N/A;  . LAPAROTOMY N/A 04/02/2016   Procedure: Re-exploration of open abdomen, application of abdominal wound vac;  Surgeon: Greer Pickerel, MD;  Location: [facility];  Service: General;  Laterality: N/A;  . LAPAROTOMY N/A 04/04/2016   Procedure: EXPLORATORY LAPAROTOMY, PLACEMENT OF ABRA ABDOMINAL WALL CLOSURE SET ;  Surgeon: Greer Pickerel, MD;  Location: [facility];  Service: General;  Laterality: N/A;  . LAPAROTOMY N/A 04/18/2016   Procedure: EXPLORATORY LAPAROTOMY;  Surgeon: Erroll Luna, MD;  Location: [facility];  Service: General;  Laterality: N/A;  . NECK SURGERY  1994   repair disk  .  OMENTECTOMY N/A 03/28/2016   Procedure: OMENTECTOMY;  Surgeon: Judeth Horn, MD;  Location: [facility];  Service: General;  Laterality: N/A;  . ROBOTIC ASSISTED TOTAL HYSTERECTOMY WITH BILATERAL SALPINGO OOPHERECTOMY  11/18/2012   Procedure: ROBOTIC ASSISTED TOTAL HYSTERECTOMY WITH BILATERAL SALPINGO OOPHORECTOMY;  Surgeon: Imagene Gurney A. Alycia Rossetti, MD;  Location: [facility];  Service: Gynecology;  Laterality: N/A;  . TONSILLECTOMY     as a child  . VACUUM ASSISTED CLOSURE CHANGE N/A 03/30/2016   Procedure: ABDOMINAL VAC CHANGE;  Surgeon: Rolm Bookbinder, MD;  Location: [facility];  Service: General;  Laterality: N/A;  . WOUND DEBRIDEMENT N/A 03/28/2016   Procedure: DEBRIDEMENT WOUND;  Surgeon: Judeth Horn, MD;  Location: [facility];  Service: General;  Laterality: N/A;     OB History    Gravida  1   Para  1   Term  1   Preterm      AB      Living  1     SAB      IAB      Ectopic      Multiple      Live Births  1           Family History  Problem Relation Age of Onset  . COPD Mother   . Diabetes Mother   . Congestive Heart Failure Mother   . Diabetes Father   . Heart attack Father     Social History   Tobacco Use  . Smoking status: Current Every Day Smoker    Packs/day: 1.50    Years: 28.00    Pack years: 42.00    Types: Cigarettes  . Smokeless tobacco: Never Used  Vaping Use  . Vaping Use: Never used  Substance Use Topics  . Alcohol use: Yes    Alcohol/week: 1.0 - 3.0 standard drink    Types: 1 - 3 Standard drinks or equivalent per week    Comment: "couple beers on the weekend"  . Drug use: No    Home Medications Prior to Admission medications   Medication Sig Start Date End Date Taking? Authorizing Provider  acetaminophen (TYLENOL) 650 MG CR tablet Take 650 mg by mouth every 8 (eight) hours as needed for pain.   Yes [provider]  albuterol (PROVENTIL HFA;VENTOLIN HFA) 108 (90 BASE) MCG/ACT inhaler Inhale 2 puffs into the lungs every 6 (six) hours as needed.  Wheezing and shortness of breath   Yes [provider]  allopurinol (ZYLOPRIM) 100 MG tablet Take 1 tablet (100 mg total) by mouth daily. 05/28/16  Yes Meredith Staggers, MD  gabapentin (NEURONTIN) 300 MG capsule Take 1 capsule by mouth at bedtime. 02/27/21  Yes [provider]  levothyroxine (SYNTHROID) 150 MCG tablet Take 1 tablet by mouth daily. 07/21/18  Yes [provider]  lisinopril (PRINIVIL,ZESTRIL) 20 MG tablet Take 20 mg by mouth daily. 01/07/17  Yes [provider]  loratadine (CLARITIN) 10 MG tablet Take 10 mg by mouth daily.   Yes [provider]  Grant Ruts INHUB 500-50 MCG/DOSE AEPB Inhale 1 puff into the lungs 2 (two) times daily. 02/27/21  Yes [provider]  ADVAIR DISKUS 250-50 MCG/DOSE AEPB Inhale 1 puff into the lungs 2 (two) times daily. Once in the am & once at pm Patient not taking: Reported on 04/14/2021 01/06/14   [provider]  ergocalciferol (VITAMIN D2) 50000 UNITS capsule Take 50,000 Units by mouth once a week. Patient not taking: Reported on 04/14/2021    [provider]  ferrous sulfate 325 (65 FE) MG tablet TAKE 1 TABLET (325 MG TOTAL) BY MOUTH 2 (TWO) TIMES DAILY WITH A MEAL. Patient not taking: No sig reported 06/20/16   Love, Ivan Anchors, PA-C  furosemide (LASIX) 20 MG tablet Take 20 mg by mouth daily. Patient not taking: Reported on 04/14/2021 01/07/17   [provider]  gabapentin (NEURONTIN) 100 MG capsule Take by mouth. Patient not taking: Reported on 04/14/2021    [provider]  hydrochlorothiazide (HYDRODIURIL) 25 MG tablet Take 1 tablet (25 mg total) by mouth daily. Patient not taking: Reported on 04/14/2021 05/28/16   Meredith Staggers, MD  levothyroxine (SYNTHROID, LEVOTHROID) 200 MCG tablet Take 200 mcg by mouth daily before breakfast.  Patient not taking: No sig reported    [provider]  Multiple Vitamin (MULTIVITAMIN) tablet Take 1 tablet by mouth daily. Patient not  taking: Reported on 04/14/2021    [provider]  pantoprazole (PROTONIX) 40 MG tablet TAKE 1 TABLET (40 MG TOTAL) BY MOUTH DAILY. Patient not taking: No sig reported 06/19/16   Meredith Staggers, MD  senna-docusate (SENOKOT-S) 8.6-50 MG tablet Take 2 tablets by mouth 3 (three) times daily before meals. Patient not taking: Reported on 04/14/2021 05/23/16   Love, Ivan Anchors, PA-C  traZODone (DESYREL) 50 MG tablet TAKE 1 TABLET (50 MG TOTAL) BY MOUTH AT BEDTIME AS NEEDED FOR SLEEP. Patient not taking: No sig reported 06/19/16   Meredith Staggers, MD    Allergies    Penicillins  Review of Systems   Review of Systems  Constitutional: Negative for fever.  HENT: Negative for ear pain.   Eyes: Negative for pain.  Respiratory: Negative for cough.   Cardiovascular: Negative for chest pain.  Gastrointestinal: Negative for abdominal pain.  Genitourinary: Negative for flank pain.  Musculoskeletal: Negative for back pain.  Skin: Negative for rash.  Neurological: Negative for headaches.    Physical Exam Updated Vital Signs BP (!) 148/66   Pulse 94   Temp 98 F (36.7 C) (Oral)   Resp 19   Ht 5\' 5"  (1.651 m)   Wt 131.5 kg   LMP 11/18/2012   SpO2 96%   BMI 48.26 kg/m   Physical Exam Constitutional:      General: She is not in acute distress.    Appearance: Normal appearance.  HENT:     Head: Normocephalic.     Nose: Nose normal.  Eyes:     Extraocular Movements: Extraocular movements intact.  Cardiovascular:     Rate and Rhythm: Normal rate.  Pulmonary:     Effort: Pulmonary effort is normal.  Musculoskeletal:        General: Tenderness present.     Cervical back: Normal range of motion.     Comments: Tenderness palpation  the left AC joint.  No abnormal warmth or cellulitis noted on the left shoulder. Patient has minimal active range of motion due to pain.  However passive range of motion appears near normal at approximately 30 degrees external rotation.  Approximately 40  degrees internal rotation.  Approximately 45 degrees forward flexion intact.   Neurological:     General: No focal deficit present.     Mental Status: She is alert. Mental status is at baseline.     ED Results / Procedures / Treatments   Labs (all labs ordered are listed, but only abnormal results are displayed) Labs Reviewed  CBC WITH DIFFERENTIAL/PLATELET - Abnormal; Notable for the following components:      Result Value   WBC 13.8 (*)    MCV 103.3 (*)    Neutro Abs 11.0 (*)    All other components within normal limits  BASIC METABOLIC PANEL - Abnormal; Notable for the following components:   Sodium 134 (*)    CO2 20 (*)    Creatinine, Ser 1.02 (*)    All other components within normal limits  TROPONIN I (HIGH SENSITIVITY)  TROPONIN I (HIGH SENSITIVITY)    EKG EKG Interpretation  Date/Time:  Friday April 14 2021 18:42:24 EDT Ventricular Rate:  82 PR Interval:  165 QRS Duration: 109 QT Interval:  426 QTC Calculation: 437 R Axis:   82 Text Interpretation: Sinus rhythm Atrial premature complexes Consider right ventricular hypertrophy Confirmed by Thamas Jaegers ([id number]) on 04/14/2021 7:27:53 PM   Radiology DG Shoulder Left  Result Date: 04/14/2021 CLINICAL DATA:  Waxing and waning left shoulder pain EXAM: LEFT SHOULDER - 2+ VIEW COMPARISON:  None. FINDINGS: There is no evidence of fracture or dislocation. Mild glenohumeral and AC joint degenerative change. Visualized lung fields are clear. Soft tissues are unremarkable. IMPRESSION: No acute osseous abnormality in the left shoulder. Electronically Signed   By: Dahlia Bailiff MD   On: 04/14/2021 19:52    Procedures .Ortho Injury Treatment  Date/Time: 04/14/2021 11:14 PM Performed by: Luna Fuse, MD Authorized by: Luna Fuse, MD  Post-procedure neurovascular assessment: post-procedure neurovascularly intact Comments: Sling to left upper extremity.      Medications Ordered in ED Medications  morphine 4 MG/ML  injection 4 mg (has no administration in time range)  morphine 2 MG/ML injection 2 mg (2 mg Intravenous Given 04/14/21 2006)  ondansetron (ZOFRAN) injection 4 mg (4 mg Intravenous Given 04/14/21 2005)    ED Course  I have reviewed the triage vital signs and the nursing notes.  Pertinent labs & imaging results that were available during my care of the patient were reviewed by me and considered in my medical decision making (see chart for details).    MDM Rules/Calculators/A&P                          Patient presents with left shoulder pain.  Differential included acute coronary syndrome versus septic joint versus dislocation or fracture.  X-rays otherwise unremarkable other than evidence of arthritic changes.  White count elevated 13.8, otherwise patient is afebrile.  Troponins are negative x2.  Patient given morphine with improvement of symptoms.  Case discussed with on-call orthopedist Dr.Harison, along with the patient.  Risks and benefits of attempted arthrocentesis of the left shoulder discussed.  Patient ultimately prefers to follow-up on an outpatient basis.  Risk of septic joint discussed.  Advised immediate return for increased pain fevers or any additional concerns.  Final Clinical Impression(s) / ED Diagnoses Final diagnoses:  Acute pain of left shoulder    Rx / DC Orders ED Discharge Orders    None       Luna Fuse, MD 04/14/21 2315

## 2022-05-14 ENCOUNTER — Other Ambulatory Visit (HOSPITAL_COMMUNITY): Payer: Self-pay | Admitting: Family Medicine

## 2022-05-14 ENCOUNTER — Other Ambulatory Visit: Payer: Self-pay | Admitting: Family Medicine

## 2022-05-14 DIAGNOSIS — Z122 Encounter for screening for malignant neoplasm of respiratory organs: Secondary | ICD-10-CM

## 2022-05-14 DIAGNOSIS — Z720 Tobacco use: Secondary | ICD-10-CM

## 2022-05-16 ENCOUNTER — Other Ambulatory Visit (HOSPITAL_COMMUNITY): Payer: Self-pay | Admitting: Family Medicine

## 2022-05-16 DIAGNOSIS — M8580 Other specified disorders of bone density and structure, unspecified site: Secondary | ICD-10-CM

## 2022-05-16 DIAGNOSIS — Z1231 Encounter for screening mammogram for malignant neoplasm of breast: Secondary | ICD-10-CM

## 2022-05-28 ENCOUNTER — Encounter (HOSPITAL_COMMUNITY): Payer: Self-pay | Admitting: Physical Therapy

## 2022-05-28 ENCOUNTER — Ambulatory Visit (HOSPITAL_COMMUNITY): Payer: Medicare Other | Attending: Family Medicine | Admitting: Physical Therapy

## 2022-05-28 DIAGNOSIS — S31109D Unspecified open wound of abdominal wall, unspecified quadrant without penetration into peritoneal cavity, subsequent encounter: Secondary | ICD-10-CM | POA: Insufficient documentation

## 2022-05-28 NOTE — Therapy (Signed)
[place], [place], [address] Phone: [phone]   Fax:  [phone]  Wound Care Evaluation  Patient Details  Name: Christina Castaneda MRN: [id number] Date of Birth: [birth year] Referring Provider (Christina): Mina Marble   Encounter Date: 05/28/2022   Christina End of Session - 05/28/22 1305     Visit Number 1    Number of Visits 12    Date for Christina Re-Evaluation 07/09/22    Authorization Type UHC medicare    Progress Note Due on Visit 10    Christina Start Time 1315    Christina Stop Time 1400    Christina Time Calculation (min) 45 min    Activity Tolerance Patient tolerated treatment well    Behavior During Therapy [facility] for tasks assessed/performed             Past Medical History:  Diagnosis Date   Asthma    mild   Atypical endometrial hyperplasia    COPD (chronic obstructive pulmonary disease) ([place])    Goiter    Gout    Heart murmur    Hypertension    Hypothyroidism    Vitamin B12 deficiency    Yeast infection    for last 3 weeks    Past Surgical History:  Procedure Laterality Date   ABDOMINAL WALL DEFECT REPAIR N/A 04/18/2016   Procedure: CLOSURE OF ABDOMEN;  Surgeon: Erroll Luna, MD;  Location: [place];  Service: General;  Laterality: N/A;   APPENDECTOMY     APPLICATION OF WOUND VAC N/A 04/02/2016   Procedure: application of wound vac+;  Surgeon: Greer Pickerel, MD;  Location: [place];  Service: General;  Laterality: N/A;   CHOLECYSTECTOMY  2009   gallstone removed   COLOSTOMY N/A 03/30/2016   Procedure: COLOSTOMY;  Surgeon: Rolm Bookbinder, MD;  Location: [place];  Service: General;  Laterality: N/A;   COLOSTOMY REVISION N/A 03/28/2016   Procedure: COLOSTOMY REVISION;  Surgeon: Judeth Horn, MD;  Location: [place];  Service: General;  Laterality: N/A;   COLOSTOMY REVISION N/A 03/30/2016   Procedure: COLON RESECTION LEFT;  Surgeon: Rolm Bookbinder, MD;  Location: [place];  Service: General;  Laterality: N/A;   LAPAROTOMY N/A 03/28/2016    Procedure: EXPLORATORY LAPAROTOMY;  Surgeon: Judeth Horn, MD;  Location: [place];  Service: General;  Laterality: N/A;   LAPAROTOMY N/A 04/02/2016   Procedure: Re-exploration of open abdomen, application of abdominal wound vac;  Surgeon: Greer Pickerel, MD;  Location: [place];  Service: General;  Laterality: N/A;   LAPAROTOMY N/A 04/04/2016   Procedure: EXPLORATORY LAPAROTOMY, PLACEMENT OF ABRA ABDOMINAL WALL CLOSURE SET ;  Surgeon: Greer Pickerel, MD;  Location: [place];  Service: General;  Laterality: N/A;   LAPAROTOMY N/A 04/18/2016   Procedure: EXPLORATORY LAPAROTOMY;  Surgeon: Erroll Luna, MD;  Location: [place];  Service: General;  Laterality: N/A;   NECK SURGERY  1994   repair disk   OMENTECTOMY N/A 03/28/2016   Procedure: OMENTECTOMY;  Surgeon: Judeth Horn, MD;  Location: [place];  Service: General;  Laterality: N/A;   ROBOTIC ASSISTED TOTAL HYSTERECTOMY WITH BILATERAL SALPINGO OOPHERECTOMY  11/18/2012   Procedure: ROBOTIC ASSISTED TOTAL HYSTERECTOMY WITH BILATERAL SALPINGO OOPHORECTOMY;  Surgeon: Imagene Gurney A. Alycia Rossetti, MD;  Location: [facility];  Service: Gynecology;  Laterality: N/A;   TONSILLECTOMY     as a child   VACUUM ASSISTED CLOSURE CHANGE N/A 03/30/2016   Procedure: ABDOMINAL VAC CHANGE;  Surgeon: Rolm Bookbinder, MD;  Location: [place];  Service:  General;  Laterality: N/A;   WOUND DEBRIDEMENT N/A 03/28/2016   Procedure: DEBRIDEMENT WOUND;  Surgeon: Judeth Horn, MD;  Location: [place];  Service: General;  Laterality: N/A;    There were no vitals filed for this visit.     [facility] Christina Assessment - 05/28/22 0001       Assessment   Medical Diagnosis non healing abdominal wound    Referring Provider (Christina) Mina Marble    Onset Date/Surgical Date 04/23/22      Precautions   Precautions --   cellulitis     Balance Screen   Has the patient fallen in the past 6 months No    Has the patient had a decrease in activity level because of a fear of falling?  No    Is the patient reluctant to leave their  home because of a fear of falling?  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  In 2017 she had severe pain out on the road and was admitted into the hospital due to a bowel infection.  She had surgery and returned home.  had an colostomy in 2017 and has had wounds ever since.    Patient and Family Stated Goals wounds to heal    Date of Onset --   chronic for superior and inferior wound.  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  She ended up having a colosomy and was discharged for follow up care in her home town in [place].  When she went to her MD there was feculent discharged and they sent her to the ER at [facility] where she was admitted and had several operations.  She stayed at [facility] from 03/28/2016 thruy 5/10 where she was discharged to IP rehab.  She stayed in [place] from 5/10 thru 05/23/2016 and states that her most superior wound has been there ever since this time.  The middle wound occured last month, she scrapped herself with a lavage while bathing.  Her and her husband have been taking care of the wounds, however the middle wound has about trippled in size therefore she went to the MD who sent her to therapy for wound care.    Evaluation and Treatment Procedures Explained to Patient/Family Yes    Evaluation and Treatment Procedures agreed to    Wound Properties Date First Assessed: 05/28/22 Time First Assessed: 1320 Wound Type: Dehisced Location: Abdomen Location Orientation: Mid Wound Description (Comments): at belly button Present on Admission: Yes   Dressing Type None    Dressing Change Frequency PRN    % Wound base Red or Granulating 60%    % Wound base Yellow/Fibrinous Exudate 40%    Wound Length (cm) 1.2 cm    Wound Width (cm) 0.8 cm    Wound Surface Area (cm^2) 0.96 cm^2    Drainage  Amount None    Treatment Cleansed;Debridement (Selective)    Wound Properties Date First Assessed: 05/28/22 Time First Assessed: 1340 Wound Type: Other (Comment) , abrasion  Location: Abdomen Location Orientation: Right Wound Description (Comments): middle wound of the three Present on Admission: Yes   Dressing Type None    % Wound base Red or Granulating 10%    %  Wound base Yellow/Fibrinous Exudate 90%    Wound Length (cm) 3 cm    Wound Width (cm) 1.2 cm    Wound Depth (cm) 0.2 cm    Wound Volume (cm^3) 0.72 cm^3    Wound Surface Area (cm^2) 3.6 cm^2    Drainage Amount None    Treatment Cleansed;Debridement (Selective)    Wound Properties Date First Assessed: 05/28/22 Time First Assessed: 1345 Location: Abdomen Location Orientation: Lower Wound Description (Comments): Rt side in abdominal fold Present on Admission: Yes   Dressing Type None    Dressing Change Frequency PRN    % Wound base Red or Granulating 100%    Wound Length (cm) 0.3 cm    Wound Width (cm) 0.3 cm    Wound Surface Area (cm^2) 0.09 cm^2    Treatment Cleansed    Selective Debridement - Location superior and middle wounds    Selective Debridement - Tools Used Forceps;Scalpel;Scissors    Selective Debridement - Tissue Removed slough    Wound Therapy - Clinical Statement Christina Castaneda has three abdominal wounds  the most superior since 2017, the middle for over a month and the inferior she is not sure about.  She went to the MD as the middle wound has trippled in size.  Her MD felt it best if she came to physical therapy for wound care.  Christina Castaneda superior and middle wound will benefit from debridement and dressing change. While the most inferior wound will only need dressing change.  Christina Castaneda will benefit from skilled Christina to continue to create a healing enviornment for all of her wounds and prevent any infection.    Wound Therapy - Functional Problem List difficulty walking    Factors Delaying/Impairing Wound Healing  Altered sensation;Infection - systemic/local;Multiple medical problems;Tobacco use;Polypharmacy    Hydrotherapy Plan Debridement;Dressing change;Patient/family education    Wound Therapy - Frequency 2X / week   x 6 weeks   Wound Therapy - Current Recommendations Christina    Wound Plan Christina to be seen for debridement and dressing change    Dressing  all wounds treated with medihoney, 2x2 and medipore tape.  Will attempt to get tegaderm for improved seal as Christina states colostomy leaks frequently.                   Objective measurements completed on examination: See above findings.               Christina Short Term Goals - 05/28/22 1444       Christina SHORT TERM GOAL #1   Title Christina superior and inferior wound to be healed.    Time 3    Period Weeks    Status New    Target Date 06/18/22      Christina SHORT TERM GOAL #2   Title Christina middle wound to be 100% granulated.    Time 3    Period Weeks    Status New    Target Date 06/18/22               Christina Long Term Goals - 05/28/22 1445       Christina LONG TERM GOAL #1   Title All wounds to be granulated    Time 6    Period Weeks    Status New    Target Date 07/09/22                   Plan - 05/28/22 1445     Clinical Impression  Statement as above    Personal Factors and Comorbidities Comorbidity 3+    Comorbidities tobacco use, mobid obesity, CVI, lymphedema    Examination-Activity Limitations Bathing;Dressing;Hygiene/Grooming    Examination-Participation Restrictions Community Activity    Stability/Clinical Decision Making Stable/Uncomplicated    Clinical Decision Making Moderate    Rehab Potential Good    Christina Frequency 2x / week    Christina Duration 6 weeks    Christina Treatment/Interventions Other (comment)   debridement and dressing change as needed.   Christina Next Visit Plan cleanse moisturize and debride all areas followed by dressing.             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   Adjustment disorder with mixed anxiety and depressed mood    Debility 05/02/2016   Edema    Generalized weakness    Chronic pain syndrome    Hypoalbuminemia due to protein-calorie malnutrition (HCC)    OSA (obstructive sleep apnea)    Morbid obesity due to excess calories ([place])    S/P colostomy ([place])    Tracheostomy status ([place])    Tracheostomy care ([place])    Chronic obstructive pulmonary disease ([place])    Tobacco abuse    Essential hypertension    Bilateral knee pain    Dysphagia    Acute blood loss anemia    Hyponatremia    Thyroid activity decreased    Acute respiratory failure ([place])    Pressure ulcer 04/01/2016   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

## 2022-05-30 ENCOUNTER — Ambulatory Visit (HOSPITAL_COMMUNITY): Payer: Medicare Other

## 2022-05-30 ENCOUNTER — Encounter (HOSPITAL_COMMUNITY): Payer: Self-pay

## 2022-05-30 DIAGNOSIS — S31109D Unspecified open wound of abdominal wall, unspecified quadrant without penetration into peritoneal cavity, subsequent encounter: Secondary | ICD-10-CM | POA: Diagnosis not present

## 2022-05-30 NOTE — Therapy (Signed)
[place], [place], [address] Phone: [phone]   Fax:  [phone]  Wound Care Therapy  Patient Details  Name: Christina Castaneda MRN: [id number] Date of Birth: [birth year] Referring Provider (PT): Mina Marble   Encounter Date: 05/30/2022   PT End of Session - 05/30/22 1134     Visit Number 2    Number of Visits 12    Date for PT Re-Evaluation 07/09/22    Authorization Type UHC medicare    Progress Note Due on Visit 10    PT Start Time 1050    PT Stop Time 1118    PT Time Calculation (min) 28 min    Activity Tolerance Patient tolerated treatment well    Behavior During Therapy WFL for tasks assessed/performed             Past Medical History:  Diagnosis Date   Asthma    mild   Atypical endometrial hyperplasia    COPD (chronic obstructive pulmonary disease) ([place])    Goiter    Gout    Heart murmur    Hypertension    Hypothyroidism    Vitamin B12 deficiency    Yeast infection    for last 3 weeks    Past Surgical History:  Procedure Laterality Date   ABDOMINAL WALL DEFECT REPAIR N/A 04/18/2016   Procedure: CLOSURE OF ABDOMEN;  Surgeon: Erroll Luna, MD;  Location: [place];  Service: General;  Laterality: N/A;   APPENDECTOMY     APPLICATION OF WOUND VAC N/A 04/02/2016   Procedure: application of wound vac+;  Surgeon: Greer Pickerel, MD;  Location: [place];  Service: General;  Laterality: N/A;   CHOLECYSTECTOMY  2009   gallstone removed   COLOSTOMY N/A 03/30/2016   Procedure: COLOSTOMY;  Surgeon: Rolm Bookbinder, MD;  Location: [place];  Service: General;  Laterality: N/A;   COLOSTOMY REVISION N/A 03/28/2016   Procedure: COLOSTOMY REVISION;  Surgeon: Judeth Horn, MD;  Location: [place];  Service: General;  Laterality: N/A;   COLOSTOMY REVISION N/A 03/30/2016   Procedure: COLON RESECTION LEFT;  Surgeon: Rolm Bookbinder, MD;  Location: [place];  Service: General;  Laterality: N/A;   LAPAROTOMY N/A 03/28/2016    Procedure: EXPLORATORY LAPAROTOMY;  Surgeon: Judeth Horn, MD;  Location: [place];  Service: General;  Laterality: N/A;   LAPAROTOMY N/A 04/02/2016   Procedure: Re-exploration of open abdomen, application of abdominal wound vac;  Surgeon: Greer Pickerel, MD;  Location: [place];  Service: General;  Laterality: N/A;   LAPAROTOMY N/A 04/04/2016   Procedure: EXPLORATORY LAPAROTOMY, PLACEMENT OF ABRA ABDOMINAL WALL CLOSURE SET ;  Surgeon: Greer Pickerel, MD;  Location: [place];  Service: General;  Laterality: N/A;   LAPAROTOMY N/A 04/18/2016   Procedure: EXPLORATORY LAPAROTOMY;  Surgeon: Erroll Luna, MD;  Location: [place];  Service: General;  Laterality: N/A;   NECK SURGERY  1994   repair disk   OMENTECTOMY N/A 03/28/2016   Procedure: OMENTECTOMY;  Surgeon: Judeth Horn, MD;  Location: [place];  Service: General;  Laterality: N/A;   ROBOTIC ASSISTED TOTAL HYSTERECTOMY WITH BILATERAL SALPINGO OOPHERECTOMY  11/18/2012   Procedure: ROBOTIC ASSISTED TOTAL HYSTERECTOMY WITH BILATERAL SALPINGO OOPHORECTOMY;  Surgeon: Imagene Gurney A. Alycia Rossetti, MD;  Location: [facility];  Service: Gynecology;  Laterality: N/A;   TONSILLECTOMY     as a child   VACUUM ASSISTED CLOSURE CHANGE N/A 03/30/2016   Procedure: ABDOMINAL VAC CHANGE;  Surgeon: Rolm Bookbinder, MD;  Location: [place];  Service:  General;  Laterality: N/A;   WOUND DEBRIDEMENT N/A 03/28/2016   Procedure: DEBRIDEMENT WOUND;  Surgeon: Judeth Horn, MD;  Location: [place];  Service: General;  Laterality: N/A;    There were no vitals filed for this visit.    Subjective Assessment - 05/30/22 1124     Subjective Pt stated dressings stayed in place, no reports of pain today.    Currently in Pain? No/denies                       Wound Therapy - 05/30/22 0001     Subjective Pt stated dressings stayed in place, no reports of pain today.    Patient and Family Stated Goals wounds to heal    Date of Onset --   chronic for superior and inferior wound.  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  She ended up having a colosomy and was discharged for follow up care in her home town in [place].  When she went to her MD there was feculent discharged and they sent her to the ER at [facility] where she was admitted and had several operations.  She stayed at [facility] from 03/28/2016 thruy 5/10 where she was discharged to IP rehab.  She stayed in [place] from 5/10 thru 05/23/2016 and states that her most superior wound has been there ever since this time.  The middle wound occured last month, she scrapped herself with a lavage while bathing.  Her and her husband have been taking care of the wounds, however the middle wound has about trippled in size therefore she went to the MD who sent her to therapy for wound care.    Evaluation and Treatment Procedures Explained to Patient/Family Yes    Evaluation and Treatment Procedures agreed to    Wound Properties Date First Assessed: 05/28/22 Time First Assessed: 1320 Wound Type: Dehisced Location: Abdomen Location Orientation: Mid Wound Description (Comments): at belly button Present on Admission: Yes   Dressing Type Gauze (Comment)   vaseline perimeter, medihoney, 2x2, tegaderm   Dressing Changed Changed    Dressing Status Clean, Dry, Intact    Dressing Change Frequency PRN    % Wound base Red or Granulating 60%    % Wound base Yellow/Fibrinous Exudate 40%    Drainage Amount None    Treatment Cleansed;Debridement (Selective)    Wound Properties Date First Assessed: 05/28/22 Time First Assessed: 1340 Wound Type: Other (Comment) , abrasion  Location: Abdomen Location Orientation: Right Wound Description (Comments): middle wound of the three Present on Admission: Yes   Dressing Type --   vaseline, 2x2, tegaderm   % Wound base Red or Granulating 15%    % Wound base Yellow/Fibrinous Exudate 85%    Drainage Amount None    Treatment Cleansed;Debridement  (Selective)    Wound Properties Date First Assessed: 05/28/22 Time First Assessed: 1345 Location: Abdomen Location Orientation: Lower Wound Description (Comments): Rt side in abdominal fold Present on Admission: Yes   Dressing Type Gauze (Comment)   2x2, tegaderm   Dressing Changed Changed    Dressing Status Old drainage    Dressing Change Frequency PRN    % Wound base Red or Granulating 100%    Drainage Amount Minimal    Drainage Description Green;Serous    Treatment Cleansed    Selective Debridement - Location superior and middle wounds  Selective Debridement - Tools Used Forceps;Scalpel;Scissors    Selective Debridement - Tissue Removed slough    Wound Therapy - Clinical Statement Wounds cleansed and debridement for superior wounds.  Noted drainage inferior wound, may need to apply alginate in future but no debridement necessary to inferior wound.  Changed to tegaderm to reduce risk of colonscopy leaking getting to wounds.  Reoprts of comfort at EOS.    Wound Therapy - Functional Problem List difficulty walking    Factors Delaying/Impairing Wound Healing Altered sensation;Infection - systemic/local;Multiple medical problems;Tobacco use;Polypharmacy    Hydrotherapy Plan Debridement;Dressing change;Patient/family education    Wound Therapy - Frequency 2X / week    Wound Therapy - Current Recommendations PT    Wound Plan PT to be seen for debridement and dressing change    Dressing  medihoney in center wound, vaseline and 2x2 on lateral, 2x2 placed on inferior wound with tegaderm (1 medium on superior wounds and small on inferior)                       PT Short Term Goals - 05/28/22 1444       PT SHORT TERM GOAL #1   Title PT superior and inferior wound to be healed.    Time 3    Period Weeks    Status New    Target Date 06/18/22      PT SHORT TERM GOAL #2   Title Pt middle wound to be 100% granulated.    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   Adjustment disorder with mixed anxiety and depressed mood    Debility 05/02/2016   Edema    Generalized weakness    Chronic pain syndrome    Hypoalbuminemia due to protein-calorie malnutrition (HCC)    OSA (obstructive sleep apnea)    Morbid obesity due to excess calories (HCC)    S/P colostomy ([place])    Tracheostomy status ([place])    Tracheostomy care ([place])    Chronic obstructive pulmonary disease ([place])    Tobacco abuse    Essential hypertension    Bilateral knee pain    Dysphagia    Acute blood loss anemia    Hyponatremia    Thyroid activity decreased    Acute respiratory failure ([place])    Pressure ulcer 04/01/2016   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

## 2022-06-05 ENCOUNTER — Encounter (HOSPITAL_COMMUNITY): Payer: Self-pay

## 2022-06-05 ENCOUNTER — Ambulatory Visit (HOSPITAL_COMMUNITY): Payer: Medicare Other | Attending: Family Medicine

## 2022-06-05 DIAGNOSIS — R262 Difficulty in walking, not elsewhere classified: Secondary | ICD-10-CM | POA: Diagnosis not present

## 2022-06-05 DIAGNOSIS — Z79899 Other long term (current) drug therapy: Secondary | ICD-10-CM | POA: Diagnosis not present

## 2022-06-05 DIAGNOSIS — S31109D Unspecified open wound of abdominal wall, unspecified quadrant without penetration into peritoneal cavity, subsequent encounter: Secondary | ICD-10-CM | POA: Diagnosis not present

## 2022-06-05 DIAGNOSIS — X58XXXD Exposure to other specified factors, subsequent encounter: Secondary | ICD-10-CM | POA: Diagnosis not present

## 2022-06-05 DIAGNOSIS — T8130XD Disruption of wound, unspecified, subsequent encounter: Secondary | ICD-10-CM | POA: Diagnosis present

## 2022-06-05 DIAGNOSIS — F17200 Nicotine dependence, unspecified, uncomplicated: Secondary | ICD-10-CM | POA: Insufficient documentation

## 2022-06-05 NOTE — Therapy (Signed)
[place], [place], [address] Phone: [phone]   Fax:  [phone]  Wound Care Therapy  Patient Details  Name: Christina Castaneda MRN: [id number] Date of Birth: Nov 28, 1956 Referring Provider (PT): Mina Marble   Encounter Date: 06/05/2022   PT End of Session - 06/05/22 1825     Visit Number 3    Number of Visits 12    Date for PT Re-Evaluation 07/09/22    Authorization Type UHC medicare    Progress Note Due on Visit 10    PT Start Time 1324    PT Stop Time 1815    PT Time Calculation (min) 24 min    Activity Tolerance Patient tolerated treatment well    Behavior During Therapy [facility] for tasks assessed/performed             Past Medical History:  Diagnosis Date   Asthma    mild   Atypical endometrial hyperplasia    COPD (chronic obstructive pulmonary disease) ([place])    Goiter    Gout    Heart murmur    Hypertension    Hypothyroidism    Vitamin B12 deficiency    Yeast infection    for last 3 weeks    Past Surgical History:  Procedure Laterality Date   ABDOMINAL WALL DEFECT REPAIR N/A 04/18/2016   Procedure: CLOSURE OF ABDOMEN;  Surgeon: Erroll Luna, MD;  Location: [facility];  Service: General;  Laterality: N/A;   APPENDECTOMY     APPLICATION OF WOUND VAC N/A 04/02/2016   Procedure: application of wound vac+;  Surgeon: Greer Pickerel, MD;  Location: [facility];  Service: General;  Laterality: N/A;   CHOLECYSTECTOMY  2009   gallstone removed   COLOSTOMY N/A 03/30/2016   Procedure: COLOSTOMY;  Surgeon: Rolm Bookbinder, MD;  Location: [place];  Service: General;  Laterality: N/A;   COLOSTOMY REVISION N/A 03/28/2016   Procedure: COLOSTOMY REVISION;  Surgeon: Judeth Horn, MD;  Location: [place];  Service: General;  Laterality: N/A;   COLOSTOMY REVISION N/A 03/30/2016   Procedure: COLON RESECTION LEFT;  Surgeon: Rolm Bookbinder, MD;  Location: [place];  Service: General;  Laterality: N/A;   LAPAROTOMY N/A 03/28/2016    Procedure: EXPLORATORY LAPAROTOMY;  Surgeon: Judeth Horn, MD;  Location: [place];  Service: General;  Laterality: N/A;   LAPAROTOMY N/A 04/02/2016   Procedure: Re-exploration of open abdomen, application of abdominal wound vac;  Surgeon: Greer Pickerel, MD;  Location: [place];  Service: General;  Laterality: N/A;   LAPAROTOMY N/A 04/04/2016   Procedure: EXPLORATORY LAPAROTOMY, PLACEMENT OF ABRA ABDOMINAL WALL CLOSURE SET ;  Surgeon: Greer Pickerel, MD;  Location: [facility];  Service: General;  Laterality: N/A;   LAPAROTOMY N/A 04/18/2016   Procedure: EXPLORATORY LAPAROTOMY;  Surgeon: Erroll Luna, MD;  Location: [place];  Service: General;  Laterality: N/A;   NECK SURGERY  1994   repair disk   OMENTECTOMY N/A 03/28/2016   Procedure: OMENTECTOMY;  Surgeon: Judeth Horn, MD;  Location: [place];  Service: General;  Laterality: N/A;   ROBOTIC ASSISTED TOTAL HYSTERECTOMY WITH BILATERAL SALPINGO OOPHERECTOMY  11/18/2012   Procedure: ROBOTIC ASSISTED TOTAL HYSTERECTOMY WITH BILATERAL SALPINGO OOPHORECTOMY;  Surgeon: Imagene Gurney A. Alycia Rossetti, MD;  [facility];  Service: Gynecology;  Laterality: N/A;   TONSILLECTOMY     as a child   VACUUM ASSISTED CLOSURE CHANGE N/A 03/30/2016   Procedure: ABDOMINAL VAC CHANGE;  Surgeon: Rolm Bookbinder, MD;  Location: [place];  Service:  General;  Laterality: N/A;   WOUND DEBRIDEMENT N/A 03/28/2016   Procedure: DEBRIDEMENT WOUND;  Surgeon: Judeth Horn, MD;  Location: [place];  Service: General;  Laterality: N/A;    There were no vitals filed for this visit.    Subjective Assessment - 06/05/22 1817     Subjective Pt stated superior dressings stayed in place, had to replace inferior wound due to colonscopy bag leaking.  No reports of pain today.                       Wound Therapy - 06/05/22 0001     Subjective Pt stated superior dressings stayed in place, had to replace inferior wound due to colonscopy bag leaking.  No reports of pain today.    Patient and Family Stated Goals  wounds to heal    Date of Onset --   chronic for superior and inferior wound.  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  She ended up having a colosomy and was discharged for follow up care in her home town in [place].  When she went to her MD there was feculent discharged and they sent her to the ER at [facility] where she was admitted and had several operations.  She stayed at [facility] from 03/28/2016 thruy 5/10 where she was discharged to IP rehab.  She stayed in [place] from 5/10 thru 05/23/2016 and states that her most superior wound has been there ever since this time.  The middle wound occured last month, she scrapped herself with a lavage while bathing.  Her and her husband have been taking care of the wounds, however the middle wound has about trippled in size therefore she went to the MD who sent her to therapy for wound care.    Evaluation and Treatment Procedures Explained to Patient/Family Yes    Evaluation and Treatment Procedures agreed to    Wound Properties Date First Assessed: 05/28/22 Time First Assessed: 1320 Wound Type: Dehisced Location: Abdomen Location Orientation: Mid Wound Description (Comments): at belly button Present on Admission: Yes   Wound Image Images linked: 1    Dressing Type Alginate   alginate, 2x2, tegaderm tape   Dressing Changed Changed    Dressing Status Clean, Dry, Intact    Dressing Change Frequency PRN    % Wound base Red or Granulating 70%    % Wound base Yellow/Fibrinous Exudate 30%    Drainage Amount Minimal    Drainage Description Serous    Treatment Cleansed;Debridement (Selective)    Wound Properties Date First Assessed: 05/28/22 Time First Assessed: 1340 Wound Type: Other (Comment) , abrasion  Location: Abdomen Location Orientation: Right Wound Description (Comments): middle wound of the three Present on Admission: Yes   Dressing Type --   vaseline, 2x2,  tegaderm   Dressing Changed Changed    Dressing Status Clean, Dry, Intact    % Wound base Red or Granulating 100%    % Wound base Yellow/Fibrinous Exudate 0%    Drainage Amount None    Treatment Cleansed;Debridement (Selective)    Wound Properties Date First Assessed: 05/28/22 Time First Assessed: 1345 Location: Abdomen Location Orientation: Lower Wound Description (Comments): Rt side in abdominal fold Present on Admission: Yes   Wound Image Images linked: 1    Dressing Type Alginate   alginate, 2x2, tegaderm tape   Dressing Changed Changed  Dressing Status Old drainage    Dressing Change Frequency PRN    % Wound base Red or Granulating 100%    Drainage Amount Minimal    Drainage Description Serous    Treatment Cleansed    Selective Debridement - Location superior and middle wounds    Selective Debridement - Tools Used Forceps    Selective Debridement - Tissue Removed slough    Wound Therapy - Clinical Statement Lateral superior wound 100% granulation followind selective debridement for removal of dead skin.  Noted increased drainage from middle and inferior wound.  Cleansed wounds well.  Changed to alginate to address drainage, 2x2 and tegaderm tape.  Reports of comfort.  Difficulty with dressing inferior wound, pt helped stretch skin to have tape tight on all edges.    Wound Therapy - Functional Problem List difficulty walking    Factors Delaying/Impairing Wound Healing Altered sensation;Infection - systemic/local;Multiple medical problems;Tobacco use;Polypharmacy    Hydrotherapy Plan Debridement;Dressing change;Patient/family education    Wound Therapy - Frequency 2X / week    Wound Therapy - Current Recommendations PT    Wound Plan PT to be seen for debridement and dressing change    Dressing  alginate in middle and inferior wound    Dressing vaseline, 2x2, tegaderm tape on lateral wound.                       PT Short Term Goals - 05/28/22 1444       PT SHORT  TERM GOAL #1   Title PT superior and inferior wound to be healed.    Time 3    Period Weeks    Status New    Target Date 06/18/22      PT SHORT TERM GOAL #2   Title Pt middle wound to be 100% granulated.    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   Adjustment disorder with mixed anxiety and depressed mood    Debility 05/02/2016   Edema    Generalized weakness    Chronic pain syndrome    Hypoalbuminemia due to protein-calorie malnutrition (HCC)    OSA (obstructive sleep apnea)    Morbid obesity due to excess calories (HCC)    S/P colostomy ([place])    Tracheostomy status ([place])    Tracheostomy care ([place])    Chronic obstructive pulmonary disease (HCC)    Tobacco abuse    Essential hypertension    Bilateral knee pain    Dysphagia    Acute blood loss anemia    Hyponatremia    Thyroid activity decreased    Acute respiratory failure ([place])  Pressure ulcer 04/01/2016   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

## 2022-06-07 ENCOUNTER — Ambulatory Visit (HOSPITAL_COMMUNITY): Payer: Medicare Other | Attending: Family Medicine | Admitting: Physical Therapy

## 2022-06-07 DIAGNOSIS — R238 Other skin changes: Secondary | ICD-10-CM | POA: Insufficient documentation

## 2022-06-07 DIAGNOSIS — S30811A Abrasion of abdominal wall, initial encounter: Secondary | ICD-10-CM | POA: Insufficient documentation

## 2022-06-07 DIAGNOSIS — S31109D Unspecified open wound of abdominal wall, unspecified quadrant without penetration into peritoneal cavity, subsequent encounter: Secondary | ICD-10-CM

## 2022-06-07 DIAGNOSIS — W228XXA Striking against or struck by other objects, initial encounter: Secondary | ICD-10-CM | POA: Diagnosis not present

## 2022-06-07 DIAGNOSIS — T8130XA Disruption of wound, unspecified, initial encounter: Secondary | ICD-10-CM | POA: Insufficient documentation

## 2022-06-07 NOTE — Therapy (Signed)
[place], [place], [address] Phone: [phone]   Fax:  [phone]  Wound Care Therapy  Patient Details  Name: Christina Castaneda MRN: [id number] Date of Birth: 03-29-1956 Referring Provider (PT): Mina Marble   Encounter Date: 06/07/2022   PT End of Session - 06/07/22 1420     Visit Number 4    Number of Visits 12    Date for PT Re-Evaluation 07/09/22    Authorization Type UHC medicare    Progress Note Due on Visit 10    PT Start Time 1320    PT Stop Time 1340    PT Time Calculation (min) 20 min    Activity Tolerance Patient tolerated treatment well    Behavior During Therapy [facility] for tasks assessed/performed             Past Medical History:  Diagnosis Date   Asthma    mild   Atypical endometrial hyperplasia    COPD (chronic obstructive pulmonary disease) ([place])    Goiter    Gout    Heart murmur    Hypertension    Hypothyroidism    Vitamin B12 deficiency    Yeast infection    for last 3 weeks    Past Surgical History:  Procedure Laterality Date   ABDOMINAL WALL DEFECT REPAIR N/A 04/18/2016   Procedure: CLOSURE OF ABDOMEN;  Surgeon: Erroll Luna, MD;  Location: [place];  Service: General;  Laterality: N/A;   APPENDECTOMY     APPLICATION OF WOUND VAC N/A 04/02/2016   Procedure: application of wound vac+;  Surgeon: Greer Pickerel, MD;  Location: [place];  Service: General;  Laterality: N/A;   CHOLECYSTECTOMY  2009   gallstone removed   COLOSTOMY N/A 03/30/2016   Procedure: COLOSTOMY;  Surgeon: Rolm Bookbinder, MD;  Location: [place];  Service: General;  Laterality: N/A;   COLOSTOMY REVISION N/A 03/28/2016   Procedure: COLOSTOMY REVISION;  Surgeon: Judeth Horn, MD;  Location: [place];  Service: General;  Laterality: N/A;   COLOSTOMY REVISION N/A 03/30/2016   Procedure: COLON RESECTION LEFT;  Surgeon: Rolm Bookbinder, MD;  Location: [place];  Service: General;  Laterality: N/A;   LAPAROTOMY N/A 03/28/2016    Procedure: EXPLORATORY LAPAROTOMY;  Surgeon: Judeth Horn, MD;  Location: [place];  Service: General;  Laterality: N/A;   LAPAROTOMY N/A 04/02/2016   Procedure: Re-exploration of open abdomen, application of abdominal wound vac;  Surgeon: Greer Pickerel, MD;  Location: [place];  Service: General;  Laterality: N/A;   LAPAROTOMY N/A 04/04/2016   Procedure: EXPLORATORY LAPAROTOMY, PLACEMENT OF ABRA ABDOMINAL WALL CLOSURE SET ;  Surgeon: Greer Pickerel, MD;  Location: [place];  Service: General;  Laterality: N/A;   LAPAROTOMY N/A 04/18/2016   Procedure: EXPLORATORY LAPAROTOMY;  Surgeon: Erroll Luna, MD;  Location: [place];  Service: General;  Laterality: N/A;   NECK SURGERY  1994   repair disk   OMENTECTOMY N/A 03/28/2016   Procedure: OMENTECTOMY;  Surgeon: Judeth Horn, MD;  Location: [place];  Service: General;  Laterality: N/A;   ROBOTIC ASSISTED TOTAL HYSTERECTOMY WITH BILATERAL SALPINGO OOPHERECTOMY  11/18/2012   Procedure: ROBOTIC ASSISTED TOTAL HYSTERECTOMY WITH BILATERAL SALPINGO OOPHORECTOMY;  Surgeon: Imagene Gurney A. Alycia Rossetti, MD;  [facility];  Service: Gynecology;  Laterality: N/A;   TONSILLECTOMY     as a child   VACUUM ASSISTED CLOSURE CHANGE N/A 03/30/2016   Procedure: ABDOMINAL VAC CHANGE;  Surgeon: Rolm Bookbinder, MD;  Location: [place];  Service:  General;  Laterality: N/A;   WOUND DEBRIDEMENT N/A 03/28/2016   Procedure: DEBRIDEMENT WOUND;  Surgeon: Judeth Horn, MD;  Location: [place];  Service: General;  Laterality: N/A;    There were no vitals filed for this visit.               Wound Therapy - 06/07/22 1421     Subjective pt states she had to change the more medial one as it was soiled.  Reports overall it is so much better.    Patient and Family Stated Goals wounds to heal    Date of Onset --   chronic for superior and inferior wound.  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  She  ended up having a colosomy and was discharged for follow up care in her home town in [place].  When she went to her MD there was feculent discharged and they sent her to the ER at [facility] where she was admitted and had several operations.  She stayed at [facility] from 03/28/2016 thruy 5/10 where she was discharged to IP rehab.  She stayed in [place] from 5/10 thru 05/23/2016 and states that her most superior wound has been there ever since this time.  The middle wound occured last month, she scrapped herself with a lavage while bathing.  Her and her husband have been taking care of the wounds, however the middle wound has about trippled in size therefore she went to the MD who sent her to therapy for wound care.    Evaluation and Treatment Procedures Explained to Patient/Family Yes    Evaluation and Treatment Procedures agreed to    Wound Properties Date First Assessed: 05/28/22 Time First Assessed: 1320 Wound Type: Dehisced Location: Abdomen Location Orientation: Mid Wound Description (Comments): at belly button Present on Admission: Yes   Dressing Type Alginate   alginate, 2x2, tegaderm tape   Dressing Status Clean, Dry, Intact    Dressing Change Frequency PRN    % Wound base Red or Granulating 95%    % Wound base Yellow/Fibrinous Exudate 5%    Drainage Amount Minimal    Drainage Description Serous    Treatment Cleansed;Debridement (Selective)    Wound Properties Date First Assessed: 05/28/22 Time First Assessed: 1340 Wound Type: Other (Comment) , abrasion  Location: Abdomen Location Orientation: Right Wound Description (Comments): middle wound of the three Present on Admission: Yes   Dressing Type --   vaseline, 2x2, tegaderm   Dressing Changed Changed    Dressing Status Clean, Dry, Intact    % Wound base Red or Granulating 100%    % Wound base Yellow/Fibrinous Exudate 0%    Drainage Amount None    Treatment Cleansed    Wound Properties Date First Assessed: 05/28/22 Time First Assessed: 1345 Location:  Abdomen Location Orientation: Lower Wound Description (Comments): Rt side in abdominal fold Present on Admission: Yes   Dressing Type Alginate   alginate, 2x2, tegaderm tape   Dressing Changed Changed    Dressing Status Old drainage    Dressing Change Frequency PRN    % Wound base Red or Granulating 100%    Drainage Amount Scant    Drainage Description Serous    Treatment Cleansed    Selective Debridement - Location middle wound    Selective Debridement - Tools Used Forceps    Selective Debridement - Tissue Removed slough  Wound Therapy - Clinical Statement Much improved granualtion of most central wound following cleansing.  Used silver hydrofiber on both wounds today to reduce moisture and improve healing environoment.  2X2 placed over both and tegaderm.  Pt overall improving well with little debridement needed this session.    Wound Therapy - Functional Problem List difficulty walking    Factors Delaying/Impairing Wound Healing Altered sensation;Infection - systemic/local;Multiple medical problems;Tobacco use;Polypharmacy    Hydrotherapy Plan Debridement;Dressing change;Patient/family education    Wound Therapy - Frequency 2X / week    Wound Therapy - Current Recommendations PT    Wound Plan PT to be seen for debridement and dressing change    Dressing  alginate in both wounds, vaseling over most lateral one as now only dry tissue    Dressing .                       PT Short Term Goals - 05/28/22 1444       PT SHORT TERM GOAL #1   Title PT superior and inferior wound to be healed.    Time 3    Period Weeks    Status New    Target Date 06/18/22      PT SHORT TERM GOAL #2   Title Pt middle wound to be 100% granulated.    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   Adjustment disorder with mixed anxiety and depressed mood    Debility 05/02/2016   Edema    Generalized weakness    Chronic pain syndrome    Hypoalbuminemia due to protein-calorie malnutrition (HCC)    OSA (obstructive sleep apnea)    Morbid obesity due to excess calories ([place])    S/P colostomy ([place])    Tracheostomy status ([place])    Tracheostomy care ([place])    Chronic obstructive pulmonary disease ([place])    Tobacco abuse    Essential hypertension    Bilateral knee pain    Dysphagia    Acute blood loss anemia    Hyponatremia    Thyroid activity decreased    Acute respiratory failure ([place])    Pressure ulcer 04/01/2016   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

## 2022-06-12 ENCOUNTER — Ambulatory Visit (HOSPITAL_COMMUNITY): Payer: Medicare Other | Attending: Family Medicine | Admitting: Physical Therapy

## 2022-06-12 DIAGNOSIS — K9409 Other complications of colostomy: Secondary | ICD-10-CM | POA: Diagnosis not present

## 2022-06-12 DIAGNOSIS — R262 Difficulty in walking, not elsewhere classified: Secondary | ICD-10-CM | POA: Insufficient documentation

## 2022-06-12 DIAGNOSIS — S31109D Unspecified open wound of abdominal wall, unspecified quadrant without penetration into peritoneal cavity, subsequent encounter: Secondary | ICD-10-CM

## 2022-06-12 DIAGNOSIS — L98499 Non-pressure chronic ulcer of skin of other sites with unspecified severity: Secondary | ICD-10-CM | POA: Diagnosis not present

## 2022-06-12 NOTE — Therapy (Signed)
[place], [place], [address] Phone: [phone]   Fax:  [phone]  Wound Care Therapy  Patient Details  Name: Christina Castaneda MRN: [id number] Date of Birth: 1956-04-02 Referring Provider (PT): Mina Marble   Encounter Date: 06/12/2022   PT End of Session - 06/12/22 1515     Visit Number 5    Number of Visits 12    Date for PT Re-Evaluation 07/09/22    Authorization Type UHC medicare    Progress Note Due on Visit 10    PT Start Time 1450    PT Stop Time 1515    PT Time Calculation (min) 25 min    Activity Tolerance Patient tolerated treatment well    Behavior During Therapy [facility] for tasks assessed/performed             Past Medical History:  Diagnosis Date   Asthma    mild   Atypical endometrial hyperplasia    COPD (chronic obstructive pulmonary disease) ([place])    Goiter    Gout    Heart murmur    Hypertension    Hypothyroidism    Vitamin B12 deficiency    Yeast infection    for last 3 weeks    Past Surgical History:  Procedure Laterality Date   ABDOMINAL WALL DEFECT REPAIR N/A 04/18/2016   Procedure: CLOSURE OF ABDOMEN;  Surgeon: Erroll Luna, MD;  Location: [place];  Service: General;  Laterality: N/A;   APPENDECTOMY     APPLICATION OF WOUND VAC N/A 04/02/2016   Procedure: application of wound vac+;  Surgeon: Greer Pickerel, MD;  Location: [place];  Service: General;  Laterality: N/A;   CHOLECYSTECTOMY  2009   gallstone removed   COLOSTOMY N/A 03/30/2016   Procedure: COLOSTOMY;  Surgeon: Rolm Bookbinder, MD;  Location: [place];  Service: General;  Laterality: N/A;   COLOSTOMY REVISION N/A 03/28/2016   Procedure: COLOSTOMY REVISION;  Surgeon: Judeth Horn, MD;  Location: [place];  Service: General;  Laterality: N/A;   COLOSTOMY REVISION N/A 03/30/2016   Procedure: COLON RESECTION LEFT;  Surgeon: Rolm Bookbinder, MD;  Location: [place];  Service: General;  Laterality: N/A;   LAPAROTOMY N/A 03/28/2016    Procedure: EXPLORATORY LAPAROTOMY;  Surgeon: Judeth Horn, MD;  Location: [place];  Service: General;  Laterality: N/A;   LAPAROTOMY N/A 04/02/2016   Procedure: Re-exploration of open abdomen, application of abdominal wound vac;  Surgeon: Greer Pickerel, MD;  Location: [place];  Service: General;  Laterality: N/A;   LAPAROTOMY N/A 04/04/2016   Procedure: EXPLORATORY LAPAROTOMY, PLACEMENT OF ABRA ABDOMINAL WALL CLOSURE SET ;  Surgeon: Greer Pickerel, MD;  Location: [place];  Service: General;  Laterality: N/A;   LAPAROTOMY N/A 04/18/2016   Procedure: EXPLORATORY LAPAROTOMY;  Surgeon: Erroll Luna, MD;  Location: [place];  Service: General;  Laterality: N/A;   NECK SURGERY  1994   repair disk   OMENTECTOMY N/A 03/28/2016   Procedure: OMENTECTOMY;  Surgeon: Judeth Horn, MD;  Location: [place];  Service: General;  Laterality: N/A;   ROBOTIC ASSISTED TOTAL HYSTERECTOMY WITH BILATERAL SALPINGO OOPHERECTOMY  11/18/2012   Procedure: ROBOTIC ASSISTED TOTAL HYSTERECTOMY WITH BILATERAL SALPINGO OOPHORECTOMY;  Surgeon: Imagene Gurney A. Alycia Rossetti, MD;  Location: [facility];  Service: Gynecology;  Laterality: N/A;   TONSILLECTOMY     as a child   VACUUM ASSISTED CLOSURE CHANGE N/A 03/30/2016   Procedure: ABDOMINAL VAC CHANGE;  Surgeon: Rolm Bookbinder, MD;  Location: [place];  Service:  General;  Laterality: N/A;   WOUND DEBRIDEMENT N/A 03/28/2016   Procedure: DEBRIDEMENT WOUND;  Surgeon: Judeth Horn, MD;  Location: [place];  Service: General;  Laterality: N/A;    There were no vitals filed for this visit.               Wound Therapy - 06/12/22 1516     Subjective pt states she had to change the deeper one near the colostomy bag due to increased drainage. Most lateral one remains healed but is dry with hard scar tissue.    Patient and Family Stated Goals wounds to heal    Date of Onset --   chronic for superior and inferior wound.  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  She ended up having a colosomy and was discharged for follow up care in her home town in [place].  When she went to her MD there was feculent discharged and they sent her to the ER at [facility] where she was admitted and had several operations.  She stayed at [facility] from 03/28/2016 thruy 5/10 where she was discharged to IP rehab.  She stayed in [place] from 5/10 thru 05/23/2016 and states that her most superior wound has been there ever since this time.  The middle wound occured last month, she scrapped herself with a lavage while bathing.  Her and her husband have been taking care of the wounds, however the middle wound has about trippled in size therefore she went to the MD who sent her to therapy for wound care.    Evaluation and Treatment Procedures Explained to Patient/Family Yes    Evaluation and Treatment Procedures agreed to    Wound Properties Date First Assessed: 05/28/22 Time First Assessed: 1320 Wound Type: Dehisced Location: Abdomen Location Orientation: Mid Wound Description (Comments): at belly button Present on Admission: Yes   Wound Image Images linked: 1    Dressing Type Silver hydrofiber   alginate, 2x2, tegaderm tape   Dressing Changed Changed    Dressing Status New drainage;Old drainage    Dressing Change Frequency PRN    % Wound base Red or Granulating 100%    Wound Length (cm) 1.5 cm   was 1.2 cm   Wound Width (cm) 0.8 cm   was 0.8cm, but hard to assess due to depth   Wound Depth (cm) 1.5 cm   was not assessed at evaluation   Wound Volume (cm^3) 1.8 cm^3    Wound Surface Area (cm^2) 1.2 cm^2    Drainage Amount Moderate    Drainage Description Serous    Treatment Cleansed    Wound Properties Date First Assessed: 05/28/22 Time First Assessed: 1340 Wound Type: Other (Comment) , abrasion  Location: Abdomen Location Orientation: Right Wound Description (Comments): middle wound of the three Present on Admission: Yes   Wound Image Images linked:  1    Dressing Type Transparent dressing;Silver hydrofiber   vaseline, 2x2, tegaderm   Dressing Changed Changed    Dressing Status Clean, Dry, Intact    Dressing Change Frequency PRN    Site / Wound Assessment Pink;Pale    % Wound base Red or Granulating 100%    Wound Length (cm) 3.6 cm   was 3 cm   Wound Width (cm) 1.4 cm   was 1.2 cm   Wound Depth (cm) 0 cm    Wound Volume (  cm^3) 0 cm^3    Wound Surface Area (cm^2) 5.04 cm^2    Drainage Amount Minimal    Drainage Description Serous    Treatment Cleansed    Selective Debridement - Location cleansing only; scar massage instruction    Selective Debridement - Tools Used Forceps    Selective Debridement - Tissue Removed slough    Wound Therapy - Clinical Statement wounds photographed and measured this session.  The most lateral one remains healed but noted dryness and tightness due to scar tissue.  Educated patient on this and instructed with self scar massage.  Also informed of silicone scar sheets that may help to soften area.  Other two wounds, though with increased granulation are measuring larger than at initial evaluation.  Unsure why this is and most medial wound with 1.5cm depth. This wound is also showing increased drainage today as compared to last week.  Cleansed well, however no debridement needed this session.  continued with silver hydrofiber due to drainage.    Wound Therapy - Functional Problem List difficulty walking    Factors Delaying/Impairing Wound Healing Altered sensation;Infection - systemic/local;Multiple medical problems;Tobacco use;Polypharmacy    Hydrotherapy Plan Debridement;Dressing change;Patient/family education    Wound Therapy - Frequency 2X / week    Wound Therapy - Current Recommendations PT    Wound Plan PT to be seen for debridement and dressing change 2X week.  May need to request cultures or change dressings if no reduction in size by next week.    Dressing  alginate in both wounds, vaseling over most  lateral one as now only dry tissue    Dressing .                       PT Short Term Goals - 05/28/22 1444       PT SHORT TERM GOAL #1   Title PT superior and inferior wound to be healed.    Time 3    Period Weeks    Status New    Target Date 06/18/22      PT SHORT TERM GOAL #2   Title Pt middle wound to be 100% granulated.    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   Adjustment disorder with mixed anxiety and depressed mood    Debility 05/02/2016   Edema    Generalized weakness    Chronic pain syndrome    Hypoalbuminemia due to protein-calorie malnutrition (HCC)    OSA (obstructive sleep apnea)    Morbid obesity due to excess calories ([place])    S/P colostomy ([place])    Tracheostomy status ([place])  Tracheostomy care [facility])    Chronic obstructive pulmonary disease ([place])    Tobacco abuse    Essential hypertension    Bilateral knee pain    Dysphagia    Acute blood loss anemia    Hyponatremia    Thyroid activity decreased    Acute respiratory failure (HCC)    Pressure ulcer 04/01/2016   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

## 2022-06-14 ENCOUNTER — Other Ambulatory Visit (HOSPITAL_COMMUNITY)
Admission: RE | Admit: 2022-06-14 | Discharge: 2022-06-14 | Disposition: A | Discharge status: Home / Self Care | Payer: Medicare Other | Source: Other Acute Inpatient Hospital | Attending: Family Medicine | Admitting: Family Medicine

## 2022-06-14 ENCOUNTER — Ambulatory Visit (HOSPITAL_COMMUNITY): Payer: Medicare Other

## 2022-06-14 ENCOUNTER — Ambulatory Visit (HOSPITAL_COMMUNITY): Payer: Medicare Other | Admitting: Physical Therapy

## 2022-06-14 DIAGNOSIS — S31109D Unspecified open wound of abdominal wall, unspecified quadrant without penetration into peritoneal cavity, subsequent encounter: Secondary | ICD-10-CM | POA: Diagnosis not present

## 2022-06-14 DIAGNOSIS — Z4800 Encounter for change or removal of nonsurgical wound dressing: Secondary | ICD-10-CM | POA: Diagnosis present

## 2022-06-14 DIAGNOSIS — X58XXXA Exposure to other specified factors, initial encounter: Secondary | ICD-10-CM | POA: Diagnosis not present

## 2022-06-14 DIAGNOSIS — Y939 Activity, unspecified: Secondary | ICD-10-CM | POA: Insufficient documentation

## 2022-06-14 NOTE — Therapy (Signed)
[place], [place], [address] Phone: [phone]   Fax:  [phone]  Wound Care Therapy  Patient Details  Name: Christina Castaneda MRN: [id number] Date of Birth: 1956/04/29 Referring Provider (PT): Mina Marble   Encounter Date: 06/14/2022   PT End of Session - 06/14/22 1008     Visit Number 6    Number of Visits 12    Date for PT Re-Evaluation 07/09/22    Authorization Type UHC medicare    Progress Note Due on Visit 10    PT Start Time 0945    PT Stop Time 1020    PT Time Calculation (min) 35 min    Activity Tolerance Patient tolerated treatment well    Behavior During Therapy WFL for tasks assessed/performed             Past Medical History:  Diagnosis Date   Asthma    mild   Atypical endometrial hyperplasia    COPD (chronic obstructive pulmonary disease) ([place])    Goiter    Gout    Heart murmur    Hypertension    Hypothyroidism    Vitamin B12 deficiency    Yeast infection    for last 3 weeks    Past Surgical History:  Procedure Laterality Date   ABDOMINAL WALL DEFECT REPAIR N/A 04/18/2016   Procedure: CLOSURE OF ABDOMEN;  Surgeon: Erroll Luna, MD;  Location: [place];  Service: General;  Laterality: N/A;   APPENDECTOMY     APPLICATION OF WOUND VAC N/A 04/02/2016   Procedure: application of wound vac+;  Surgeon: Greer Pickerel, MD;  Location: [place];  Service: General;  Laterality: N/A;   CHOLECYSTECTOMY  2009   gallstone removed   COLOSTOMY N/A 03/30/2016   Procedure: COLOSTOMY;  Surgeon: Rolm Bookbinder, MD;  Location: [place];  Service: General;  Laterality: N/A;   COLOSTOMY REVISION N/A 03/28/2016   Procedure: COLOSTOMY REVISION;  Surgeon: Judeth Horn, MD;  Location: [place];  Service: General;  Laterality: N/A;   COLOSTOMY REVISION N/A 03/30/2016   Procedure: COLON RESECTION LEFT;  Surgeon: Rolm Bookbinder, MD;  Location: [place];  Service: General;  Laterality: N/A;   LAPAROTOMY N/A 03/28/2016    Procedure: EXPLORATORY LAPAROTOMY;  Surgeon: Judeth Horn, MD;  Location: [place];  Service: General;  Laterality: N/A;   LAPAROTOMY N/A 04/02/2016   Procedure: Re-exploration of open abdomen, application of abdominal wound vac;  Surgeon: Greer Pickerel, MD;  Location: [place];  Service: General;  Laterality: N/A;   LAPAROTOMY N/A 04/04/2016   Procedure: EXPLORATORY LAPAROTOMY, PLACEMENT OF ABRA ABDOMINAL WALL CLOSURE SET ;  Surgeon: Greer Pickerel, MD;  Location: [place];  Service: General;  Laterality: N/A;   LAPAROTOMY N/A 04/18/2016   Procedure: EXPLORATORY LAPAROTOMY;  Surgeon: Erroll Luna, MD;  Location: [place];  Service: General;  Laterality: N/A;   NECK SURGERY  1994   repair disk   OMENTECTOMY N/A 03/28/2016   Procedure: OMENTECTOMY;  Surgeon: Judeth Horn, MD;  Location: [place];  Service: General;  Laterality: N/A;   ROBOTIC ASSISTED TOTAL HYSTERECTOMY WITH BILATERAL SALPINGO OOPHERECTOMY  11/18/2012   Procedure: ROBOTIC ASSISTED TOTAL HYSTERECTOMY WITH BILATERAL SALPINGO OOPHORECTOMY;  Surgeon: Imagene Gurney A. Alycia Rossetti, MD;  Location: [facility];  Service: Gynecology;  Laterality: N/A;   TONSILLECTOMY     as a child   VACUUM ASSISTED CLOSURE CHANGE N/A 03/30/2016   Procedure: ABDOMINAL VAC CHANGE;  Surgeon: Rolm Bookbinder, MD;  Location: [place];  Service:  General;  Laterality: N/A;   WOUND DEBRIDEMENT N/A 03/28/2016   Procedure: DEBRIDEMENT WOUND;  Surgeon: Judeth Horn, MD;  Location: [place];  Service: General;  Laterality: N/A;    There were no vitals filed for this visit.               Wound Therapy - 06/14/22 1033     Subjective pt states the most central one really drained and she had to change it this morning.  Also noted green bloody drainage from wound.  States she's been doing the scar massage to the area that has healed.    Patient and Family Stated Goals wounds to heal    Date of Onset --   chronic for superior and inferior wound.  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  She ended up having a colosomy and was discharged for follow up care in her home town in [place].  When she went to her MD there was feculent discharged and they sent her to the ER at [facility] where she was admitted and had several operations.  She stayed at [facility] from 03/28/2016 thruy 5/10 where she was discharged to IP rehab.  She stayed in [place] from 5/10 thru 05/23/2016 and states that her most superior wound has been there ever since this time.  The middle wound occured last month, she scrapped herself with a lavage while bathing.  Her and her husband have been taking care of the wounds, however the middle wound has about trippled in size therefore she went to the MD who sent her to therapy for wound care.    Evaluation and Treatment Procedures Explained to Patient/Family Yes    Evaluation and Treatment Procedures agreed to    Wound Properties Date First Assessed: 05/28/22 Time First Assessed: 1320 Wound Type: Dehisced Location: Abdomen Location Orientation: Mid Wound Description (Comments): at belly button Present on Admission: Yes   Dressing Type Silver hydrofiber   alginate, 2x2, tegaderm tape   Dressing Changed Changed    Dressing Status New drainage;Old drainage    Dressing Change Frequency PRN    % Wound base Red or Granulating 100%    Wound Length (cm) 1.4 cm    Wound Width (cm) 0.8 cm    Wound Depth (cm) 1.5 cm    Wound Volume (cm^3) 1.68 cm^3    Wound Surface Area (cm^2) 1.12 cm^2    Drainage Amount Moderate    Drainage Description Green;No odor    Treatment Cleansed;Other (Comment)   Completed a culture sample   Wound Properties Date First Assessed: 05/28/22 Time First Assessed: 1340 Wound Type: Other (Comment) , abrasion  Location: Abdomen Location Orientation: Right Wound Description (Comments): middle wound of the three Present on Admission: Yes   Dressing Type Transparent dressing;Silver hydrofiber    vaseline, 2x2, tegaderm   Dressing Changed Changed    Dressing Status Clean, Dry, Intact    Dressing Change Frequency PRN    Site / Wound Assessment Pink;Pale    % Wound base Red or Granulating 100%    Wound Length (cm) 3.4 cm    Wound Width (cm) 1.2 cm    Wound Depth (cm) 0 cm    Wound Volume (cm^3) 0 cm^3    Wound Surface Area (cm^2) 4.08 cm^2    Drainage Amount Minimal    Drainage Description Serous    Treatment  Cleansed    Selective Debridement - Location cleansing only; culture completed    Selective Debridement - Tools Used Forceps    Selective Debridement - Tissue Removed slough    Wound Therapy - Clinical Statement Most central, deeper wound continues to increase in drainage and with greenish, bloody exudate.  Called MD and got verbal/signed order for wound culture for this wound.  Both wounds were 1 cm smaller as compared to last visits measurements, thus showing improvements.  No debridement was required today, just thorough irrigation and redressing of both.  Larger tegaderm worked better for both wounds and stayed intact better.    Wound Therapy - Functional Problem List difficulty walking    Factors Delaying/Impairing Wound Healing Altered sensation;Infection - systemic/local;Multiple medical problems;Tobacco use;Polypharmacy    Hydrotherapy Plan Debridement;Dressing change;Patient/family education    Wound Therapy - Frequency 2X / week    Wound Therapy - Current Recommendations PT    Wound Plan PT to be seen for debridement and dressing change 2X week.follow up with wound culture results.    Dressing  alginate in both wounds, vaseline perimeter over most lateral one, 2X2 and tegaderm sheets    Dressing --    Manual Therapy Manual charge X1 no debridement needed.                       PT Short Term Goals - 05/28/22 1444       PT SHORT TERM GOAL #1   Title PT superior and inferior wound to be healed.    Time 3    Period Weeks    Status New    Target Date  06/18/22      PT SHORT TERM GOAL #2   Title Pt middle wound to be 100% granulated.    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   Adjustment disorder with mixed anxiety and depressed mood    Debility 05/02/2016   Edema    Generalized weakness    Chronic pain syndrome    Hypoalbuminemia due to protein-calorie malnutrition (HCC)    OSA (obstructive sleep apnea)    Morbid obesity due to excess calories (H[facility])    S/P colostomy ([place])    Tracheostomy status ([place])    Tracheostomy care ([place])    Chronic obstructive pulmonary disease (H[facility])    Tobacco abuse    Essential hypertension    Bilateral knee pain    Dysphagia    Acute blood loss anemia    Hyponatremia    Thyroid activity decreased    Acute respiratory failure ([place])    Pressure ulcer 04/01/2016  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

## 2022-06-16 LAB — AEROBIC CULTURE W GRAM STAIN (SUPERFICIAL SPECIMEN): Gram Stain: NONE SEEN

## 2022-06-19 ENCOUNTER — Ambulatory Visit (HOSPITAL_COMMUNITY): Payer: Medicare Other | Admitting: Physical Therapy

## 2022-06-19 DIAGNOSIS — S31109D Unspecified open wound of abdominal wall, unspecified quadrant without penetration into peritoneal cavity, subsequent encounter: Secondary | ICD-10-CM

## 2022-06-19 NOTE — Therapy (Signed)
[facility] [address] [place], [place], [address] Phone: [phone]   Fax:  [phone]  Wound Care Therapy  Patient Details  Name: CHAVA AXT MRN: [id number] Date of Birth: Aug 24, 1956 Referring Provider (PT): Orpah Cobb   Encounter Date: 06/19/2022   PT End of Session - 06/19/22 1705     Visit Number 7    Number of Visits 12    Date for PT Re-Evaluation 07/09/22    Authorization Type UHC medicare    Progress Note Due on Visit 10    PT Start Time 1400    PT Stop Time 1416    PT Time Calculation (min) 16 min    Activity Tolerance Patient tolerated treatment well    Behavior During Therapy [facility] for tasks assessed/performed             Past Medical History:  Diagnosis Date   Asthma    mild   Atypical endometrial hyperplasia    COPD (chronic obstructive pulmonary disease) (HCC)    Goiter    Gout    Heart murmur    Hypertension    Hypothyroidism    Vitamin B12 deficiency    Yeast infection    for last 3 weeks    Past Surgical History:  Procedure Laterality Date   ABDOMINAL WALL DEFECT REPAIR N/A 04/18/2016   Procedure: CLOSURE OF ABDOMEN;  Surgeon: Harriette Bouillon, MD;  [facility]: [facility] OR;  Service: General;  Laterality: N/A;   APPENDECTOMY     APPLICATION OF WOUND VAC N/A 04/02/2016   Procedure: application of wound vac+;  Surgeon: Gaynelle Adu, MD;  Location: [facility] OR;  Service: General;  Laterality: N/A;   CHOLECYSTECTOMY  2009   gallstone removed   COLOSTOMY N/A 03/30/2016   Procedure: COLOSTOMY;  Surgeon: Emelia Loron, MD;  Location: [facility] OR;  Service: General;  Laterality: N/A;   COLOSTOMY REVISION N/A 03/28/2016   Procedure: COLOSTOMY REVISION;  Surgeon: Jimmye Norman, MD;  Location: [facility] OR;  Service: General;  Laterality: N/A;   COLOSTOMY REVISION N/A 03/30/2016   Procedure: COLON RESECTION LEFT;  Surgeon: Emelia Loron, MD;  Location: [facility] OR;  Service: General;  Laterality: N/A;   LAPAROTOMY N/A 03/28/2016    Procedure: EXPLORATORY LAPAROTOMY;  Surgeon: Jimmye Norman, MD;  Location: [facility] OR;  Service: General;  Laterality: N/A;   LAPAROTOMY N/A 04/02/2016   Procedure: Re-exploration of open abdomen, application of abdominal wound vac;  Surgeon: Gaynelle Adu, MD;  Location: [facility] OR;  Service: General;  Laterality: N/A;   LAPAROTOMY N/A 04/04/2016   Procedure: EXPLORATORY LAPAROTOMY, PLACEMENT OF ABRA ABDOMINAL WALL CLOSURE SET ;  Surgeon: Gaynelle Adu, MD;  Location: [facility] OR;  Service: General;  Laterality: N/A;   LAPAROTOMY N/A 04/18/2016   Procedure: EXPLORATORY LAPAROTOMY;  Surgeon: Harriette Bouillon, MD;  Location: [facility] OR;  Service: General;  Laterality: N/A;   NECK SURGERY  1994   repair disk   OMENTECTOMY N/A 03/28/2016   Procedure: OMENTECTOMY;  Surgeon: Jimmye Norman, MD;  Location: [facility] OR;  Service: General;  Laterality: N/A;   ROBOTIC ASSISTED TOTAL HYSTERECTOMY WITH BILATERAL SALPINGO OOPHERECTOMY  11/18/2012   Procedure: ROBOTIC ASSISTED TOTAL HYSTERECTOMY WITH BILATERAL SALPINGO OOPHORECTOMY;  Surgeon: Rejeana Brock A. Duard Brady, MD;  Location: [facility] ORS;  Service: Gynecology;  Laterality: N/A;   TONSILLECTOMY     as a child   VACUUM ASSISTED CLOSURE CHANGE N/A 03/30/2016   Procedure: ABDOMINAL VAC CHANGE;  Surgeon: Emelia Loron, MD;  Location: [facility] OR;  Service:  General;  Laterality: N/A;   WOUND DEBRIDEMENT N/A 03/28/2016   Procedure: DEBRIDEMENT WOUND;  Surgeon: Jimmye Norman, MD;  Location: [facility] OR;  Service: General;  Laterality: N/A;    There were no vitals filed for this visit.               Wound Therapy - 06/19/22 1706     Subjective pt states she had to replace some of the bandage as it drained through    Patient and Family Stated Goals wounds to heal    Date of Onset --   chronic for superior and inferior wound.  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  She ended up having a colosomy and was  discharged for follow up care in her home town in [place].  When she went to her MD there was feculent discharged and they sent her to the ER at [facility] where she was admitted and had several operations.  She stayed at [facility] from 03/28/2016 thruy 5/10 where she was discharged to IP rehab.  She stayed in [place] from 5/10 thru 05/23/2016 and states that her most superior wound has been there ever since this time.  The middle wound occured last month, she scrapped herself with a lavage while bathing.  Her and her husband have been taking care of the wounds, however the middle wound has about trippled in size therefore she went to the MD who sent her to therapy for wound care.    Evaluation and Treatment Procedures Explained to Patient/Family Yes    Evaluation and Treatment Procedures agreed to    Wound Properties Date First Assessed: 05/28/22 Time First Assessed: 1320 Wound Type: Dehisced Location: Abdomen Location Orientation: Mid Wound Description (Comments): at belly button Present on Admission: Yes   Dressing Type Silver hydrofiber   alginate, 2x2, tegaderm tape   Dressing Changed Changed    Dressing Status New drainage;Old drainage    Dressing Change Frequency PRN    % Wound base Red or Granulating 100%    Drainage Amount Moderate    Drainage Description No odor;Green    Treatment Cleansed;Debridement (Selective)    Wound Properties Date First Assessed: 05/28/22 Time First Assessed: 1340 Wound Type: Other (Comment) , abrasion  Location: Abdomen Location Orientation: Right Wound Description (Comments): middle wound of the three Present on Admission: Yes   Dressing Type Transparent dressing;Silver hydrofiber   vaseline, 2x2, tegaderm   Dressing Changed Changed    Dressing Status Clean, Dry, Intact    Dressing Change Frequency PRN    Site / Wound Assessment Pink;Pale    % Wound base Red or Granulating 80%    % Wound base Yellow/Fibrinous Exudate 20%    Peri-wound Assessment Maceration    Undermining (cm)  at most superior point 0.2cm    Drainage Amount Minimal    Drainage Description Serous    Treatment Cleansed;Debridement (Selective)    Selective Debridement - Location to most lateral wound    Selective Debridement - Tools Used Forceps    Selective Debridement - Tissue Removed slough    Wound Therapy - Clinical Statement wound culture with a few staph aureas but no other bacterias present; MD office has not contacted pt so unsure if antibiotic is warranted; pt to follow up.  Both wounds continue to have moderate drainage.  More slough present in more lateral superficial wound and noted  area of undermining at most superior wound border.  Debrided this, cleansed both wounds well and continued with silver hydrofiber dressing and tegaderm.  Pt reported areas always feel better after woundcare.    Wound Therapy - Functional Problem List difficulty walking    Factors Delaying/Impairing Wound Healing Altered sensation;Infection - systemic/local;Multiple medical problems;Tobacco use;Polypharmacy    Hydrotherapy Plan Debridement;Dressing change;Patient/family education    Wound Therapy - Frequency 2X / week    Wound Therapy - Current Recommendations PT    Wound Plan PT to be seen for debridement and dressing change 2X week.follow up with wound culture results.    Dressing  alginate in both wounds, vaseline perimeter over most lateral one, 2X2 and tegaderm sheets    Manual Therapy debridement charge today as worked on most lateral wound and undermining                       PT Short Term Goals - 05/28/22 1444       PT SHORT TERM GOAL #1   Title PT superior and inferior wound to be healed.    Time 3    Period Weeks    Status New    Target Date 06/18/22      PT SHORT TERM GOAL #2   Title Pt middle wound to be 100% granulated.    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   Adjustment disorder with mixed anxiety and depressed mood    Debility 05/02/2016   Edema    Generalized weakness    Chronic pain syndrome    Hypoalbuminemia due to protein-calorie malnutrition (HCC)    OSA (obstructive sleep apnea)    Morbid obesity due to excess calories (HCC)    S/P colostomy (HCC)    Tracheostomy status (HCC)    Tracheostomy care (HCC)    Chronic obstructive pulmonary disease (HCC)    Tobacco abuse    Essential hypertension    Bilateral knee pain    Dysphagia    Acute blood loss anemia    Hyponatremia    Thyroid activity decreased    Acute respiratory failure (HCC)    Pressure ulcer 04/01/2016   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 [place], [place], [address] Phone: [phone]   Fax:  [phone]  Name: MARY-ANN WOZNY MRN: [id number] Date of Birth: 1956-11-06

## 2022-06-21 ENCOUNTER — Ambulatory Visit (HOSPITAL_COMMUNITY): Payer: Medicare Other | Admitting: Physical Therapy

## 2022-06-21 DIAGNOSIS — S31109D Unspecified open wound of abdominal wall, unspecified quadrant without penetration into peritoneal cavity, subsequent encounter: Secondary | ICD-10-CM | POA: Diagnosis not present

## 2022-06-21 NOTE — Therapy (Signed)
[place], [place], [address] Phone: [phone]   Fax:  [phone]  Wound Care Therapy  Patient Details  Name: Christina Castaneda MRN: [id number] Date of Birth: 1956/09/06 Referring Provider (PT): Mina Marble   Encounter Date: 06/21/2022   PT End of Session - 06/21/22 1646     Visit Number 8    Number of Visits 12    Date for PT Re-Evaluation 07/09/22    Authorization Type UHC medicare    Progress Note Due on Visit 10    PT Start Time 1615    PT Stop Time 1645    PT Time Calculation (min) 30 min    Activity Tolerance Patient tolerated treatment well    Behavior During Therapy [facility] for tasks assessed/performed             Past Medical History:  Diagnosis Date   Asthma    mild   Atypical endometrial hyperplasia    COPD (chronic obstructive pulmonary disease) ([place])    Goiter    Gout    Heart murmur    Hypertension    Hypothyroidism    Vitamin B12 deficiency    Yeast infection    for last 3 weeks    Past Surgical History:  Procedure Laterality Date   ABDOMINAL WALL DEFECT REPAIR N/A 04/18/2016   Procedure: CLOSURE OF ABDOMEN;  Surgeon: Erroll Luna, MD;  Location: [place];  Service: General;  Laterality: N/A;   APPENDECTOMY     APPLICATION OF WOUND VAC N/A 04/02/2016   Procedure: application of wound vac+;  Surgeon: Greer Pickerel, MD;  Location: [place];  Service: General;  Laterality: N/A;   CHOLECYSTECTOMY  2009   gallstone removed   COLOSTOMY N/A 03/30/2016   Procedure: COLOSTOMY;  Surgeon: Rolm Bookbinder, MD;  Location: [place];  Service: General;  Laterality: N/A;   COLOSTOMY REVISION N/A 03/28/2016   Procedure: COLOSTOMY REVISION;  Surgeon: Judeth Horn, MD;  Location: [place];  Service: General;  Laterality: N/A;   COLOSTOMY REVISION N/A 03/30/2016   Procedure: COLON RESECTION LEFT;  Surgeon: Rolm Bookbinder, MD;  Location: [place];  Service: General;  Laterality: N/A;   LAPAROTOMY N/A 03/28/2016    Procedure: EXPLORATORY LAPAROTOMY;  Surgeon: Judeth Horn, MD;  Location: [place];  Service: General;  Laterality: N/A;   LAPAROTOMY N/A 04/02/2016   Procedure: Re-exploration of open abdomen, application of abdominal wound vac;  Surgeon: Greer Pickerel, MD;  Location: [place];  Service: General;  Laterality: N/A;   LAPAROTOMY N/A 04/04/2016   Procedure: EXPLORATORY LAPAROTOMY, PLACEMENT OF ABRA ABDOMINAL WALL CLOSURE SET ;  Surgeon: Greer Pickerel, MD;  Location: [place];  Service: General;  Laterality: N/A;   LAPAROTOMY N/A 04/18/2016   Procedure: EXPLORATORY LAPAROTOMY;  Surgeon: Erroll Luna, MD;  Location: [place];  Service: General;  Laterality: N/A;   NECK SURGERY  1994   repair disk   OMENTECTOMY N/A 03/28/2016   Procedure: OMENTECTOMY;  Surgeon: Judeth Horn, MD;  Location: [place];  Service: General;  Laterality: N/A;   ROBOTIC ASSISTED TOTAL HYSTERECTOMY WITH BILATERAL SALPINGO OOPHERECTOMY  11/18/2012   Procedure: ROBOTIC ASSISTED TOTAL HYSTERECTOMY WITH BILATERAL SALPINGO OOPHORECTOMY;  Surgeon: Imagene Gurney A. Alycia Rossetti, MD;  Location: [facility];  Service: Gynecology;  Laterality: N/A;   TONSILLECTOMY     as a child   VACUUM ASSISTED CLOSURE CHANGE N/A 03/30/2016   Procedure: ABDOMINAL VAC CHANGE;  Surgeon: Rolm Bookbinder, MD;  Location: [place];  Service:  General;  Laterality: N/A;   WOUND DEBRIDEMENT N/A 03/28/2016   Procedure: DEBRIDEMENT WOUND;  Surgeon: Judeth Horn, MD;  Location: [place];  Service: General;  Laterality: N/A;    There were no vitals filed for this visit.               Wound Therapy - 06/21/22 0001     Subjective Pt states that she is on antibiotics now.    Patient and Family Stated Goals wounds to heal    Date of Onset --   chronic for superior and inferior wound.  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  She ended up having a colosomy and was discharged for follow up care  in her home town in [place].  When she went to her MD there was feculent discharged and they sent her to the ER at [facility] where she was admitted and had several operations.  She stayed at [facility] from 03/28/2016 thruy 5/10 where she was discharged to IP rehab.  She stayed in [place] from 5/10 thru 05/23/2016 and states that her most superior wound has been there ever since this time.  The middle wound occured last month, she scrapped herself with a lavage while bathing.  Her and her husband have been taking care of the wounds, however the middle wound has about trippled in size therefore she went to the MD who sent her to therapy for wound care.    Evaluation and Treatment Procedures Explained to Patient/Family Yes    Evaluation and Treatment Procedures agreed to    Wound Properties Date First Assessed: 05/28/22 Time First Assessed: 1320 Wound Type: Dehisced Location: Abdomen Location Orientation: Mid Wound Description (Comments): at belly button Present on Admission: Yes Final Assessment Date: 06/21/22 Final Assessment Time: 1620   Wound Properties Date First Assessed: 05/28/22 Time First Assessed: 1340 Wound Type: Other (Comment) , abrasion  Location: Abdomen Location Orientation: Right Wound Description (Comments): middle wound of the three Present on Admission: Yes   Dressing Type Silver hydrofiber;Transparent dressing    Dressing Changed Changed    Dressing Status Old drainage    Dressing Change Frequency PRN    Site / Wound Assessment Pale;Pink    % Wound base Red or Granulating 100%   following debridement   % Wound base Yellow/Fibrinous Exudate 0%    Wound Length (cm) 3.2 cm    Wound Width (cm) 1.2 cm    Wound Surface Area (cm^2) 3.84 cm^2    Margins Epibole (rolled edges)    Drainage Amount Minimal    Drainage Description Serous    Treatment Cleansed;Debridement (Selective)    Wound Properties Date First Assessed: 05/28/22 Time First Assessed: 1345 Location: Abdomen Location Orientation: Lower Wound  Description (Comments): Rt side in abdominal fold Present on Admission: Yes   Dressing Type Silver hydrofiber    Dressing Changed Changed    Dressing Change Frequency PRN    % Wound base Red or Granulating 100%    Wound Length (cm) 0.5 cm    Wound Width (cm) 0.5 cm    Wound Depth (cm) 3.5 cm    Wound Volume (cm^3) 0.88 cm^3    Wound Surface Area (cm^2) 0.25 cm^2    Drainage Amount Minimal    Drainage Description Sanguineous    Treatment Cleansed    Selective Debridement - Location wound bed and epibold edges  Selective Debridement - Tools Used Forceps    Selective Debridement - Tissue Removed slough    Wound Therapy - Clinical Statement PT currently on antibiotics.  Most superior wound has healed.  Epiboled edges and slough debrided from middle wound.  Wound located in abdominal fold was able to be inspected better and noted to have a significant depth. Silver hydrofiber placed into wound.    Wound Therapy - Functional Problem List difficulty walking    Factors Delaying/Impairing Wound Healing Altered sensation;Infection - systemic/local;Multiple medical problems;Tobacco use;Polypharmacy    Hydrotherapy Plan Debridement;Dressing change;Patient/family education    Wound Therapy - Frequency 2X / week    Wound Therapy - Current Recommendations PT    Wound Plan Continue with debridement of epiboled edges.  Measure weekly.  Continue with debridement as necessary.    Dressing  Silverhydrofiber to both wounds followed by tegaderm    Manual Therapy de                       PT Short Term Goals - 06/21/22 1658       PT SHORT TERM GOAL #1   Title PT superior and inferior wound to be healed.    Time 3    Period Weeks    Status Achieved    Target Date 06/18/22      PT SHORT TERM GOAL #2   Title Pt middle wound to be 100% granulated.    Time 3    Period Weeks    Status Achieved    Target Date 06/18/22               PT Long Term Goals - 06/21/22 1658       PT  LONG TERM GOAL #1   Title All wounds to be granulated    Time 6    Period Weeks    Status On-going    Target Date 07/09/22                   Plan - 06/21/22 1657     Clinical Impression Statement as above    Personal Factors and Comorbidities Comorbidity 3+    Comorbidities tobacco use, mobid obesity, CVI, lymphedema    Examination-Activity Limitations Bathing;Dressing;Hygiene/Grooming    Examination-Participation Restrictions Community Activity    Stability/Clinical Decision Making Stable/Uncomplicated    Rehab Potential Good    PT Frequency 2x / week    PT Duration 6 weeks    PT Treatment/Interventions Other (comment)   debridement and dressing change as needed.   PT Next Visit Plan cleanse moisturize and debride all areas followed by dressing.             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   Adjustment disorder with mixed anxiety and depressed mood    Debility 05/02/2016   Edema    Generalized weakness    Chronic pain syndrome    Hypoalbuminemia due to protein-calorie malnutrition (HCC)    OSA (obstructive sleep apnea)    Morbid obesity due to excess calories ([place])    S/P colostomy ([place])    Tracheostomy status ([place])    Tracheostomy care ([place])  Chronic obstructive pulmonary disease (HCC)    Tobacco abuse    Essential hypertension    Bilateral knee pain    Dysphagia    Acute blood loss anemia    Hyponatremia    Thyroid activity decreased    Acute respiratory failure (HCC)    Pressure ulcer 04/01/2016   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

## 2022-06-22 ENCOUNTER — Ambulatory Visit (HOSPITAL_COMMUNITY)
Admission: RE | Admit: 2022-06-22 | Discharge: 2022-06-22 | Disposition: A | Discharge status: Home / Self Care | Payer: Medicare Other | Source: Ambulatory Visit | Attending: Family Medicine | Admitting: Family Medicine

## 2022-06-22 DIAGNOSIS — J432 Centrilobular emphysema: Secondary | ICD-10-CM | POA: Insufficient documentation

## 2022-06-22 DIAGNOSIS — J438 Other emphysema: Secondary | ICD-10-CM | POA: Diagnosis not present

## 2022-06-22 DIAGNOSIS — F17210 Nicotine dependence, cigarettes, uncomplicated: Secondary | ICD-10-CM | POA: Insufficient documentation

## 2022-06-22 DIAGNOSIS — Z720 Tobacco use: Secondary | ICD-10-CM | POA: Insufficient documentation

## 2022-06-22 DIAGNOSIS — N200 Calculus of kidney: Secondary | ICD-10-CM | POA: Insufficient documentation

## 2022-06-22 DIAGNOSIS — I2510 Atherosclerotic heart disease of native coronary artery without angina pectoris: Secondary | ICD-10-CM | POA: Insufficient documentation

## 2022-06-22 DIAGNOSIS — I700 Atherosclerosis of aorta: Secondary | ICD-10-CM | POA: Diagnosis not present

## 2022-06-22 DIAGNOSIS — Z122 Encounter for screening for malignant neoplasm of respiratory organs: Secondary | ICD-10-CM | POA: Insufficient documentation

## 2022-06-27 ENCOUNTER — Encounter (HOSPITAL_COMMUNITY): Payer: Self-pay | Admitting: Physical Therapy

## 2022-06-27 ENCOUNTER — Ambulatory Visit (HOSPITAL_COMMUNITY): Payer: Medicare Other | Attending: Family Medicine | Admitting: Physical Therapy

## 2022-06-27 DIAGNOSIS — S31109D Unspecified open wound of abdominal wall, unspecified quadrant without penetration into peritoneal cavity, subsequent encounter: Secondary | ICD-10-CM | POA: Insufficient documentation

## 2022-06-27 NOTE — Therapy (Signed)
[place], [place], [address] Phone: [phone]   Fax:  [phone]  Wound Care Therapy  Patient Details  Name: Christina Castaneda MRN: [id number] Date of Birth: 02/10/1956 Referring Provider (PT): Mina Marble   Encounter Date: 06/27/2022   PT End of Session - 06/27/22 0946     Visit Number 9    Number of Visits 12    Date for PT Re-Evaluation 07/09/22    Authorization Type UHC medicare    Progress Note Due on Visit 10    PT Start Time [phone]    PT Stop Time 1017    PT Time Calculation (min) 30 min    Activity Tolerance Patient tolerated treatment well    Behavior During Therapy [facility] for tasks assessed/performed             Past Medical History:  Diagnosis Date   Asthma    mild   Atypical endometrial hyperplasia    COPD (chronic obstructive pulmonary disease) ([place])    Goiter    Gout    Heart murmur    Hypertension    Hypothyroidism    Vitamin B12 deficiency    Yeast infection    for last 3 weeks    Past Surgical History:  Procedure Laterality Date   ABDOMINAL WALL DEFECT REPAIR N/A 04/18/2016   Procedure: CLOSURE OF ABDOMEN;  Surgeon: Erroll Luna, MD;  Location: [place];  Service: General;  Laterality: N/A;   APPENDECTOMY     APPLICATION OF WOUND VAC N/A 04/02/2016   Procedure: application of wound vac+;  Surgeon: Greer Pickerel, MD;  Location: [place];  Service: General;  Laterality: N/A;   CHOLECYSTECTOMY  2009   gallstone removed   COLOSTOMY N/A 03/30/2016   Procedure: COLOSTOMY;  Surgeon: Rolm Bookbinder, MD;  Location: [place];  Service: General;  Laterality: N/A;   COLOSTOMY REVISION N/A 03/28/2016   Procedure: COLOSTOMY REVISION;  Surgeon: Judeth Horn, MD;  Location: [place];  Service: General;  Laterality: N/A;   COLOSTOMY REVISION N/A 03/30/2016   Procedure: COLON RESECTION LEFT;  Surgeon: Rolm Bookbinder, MD;  Location: [place];  Service: General;  Laterality: N/A;   LAPAROTOMY N/A 03/28/2016    Procedure: EXPLORATORY LAPAROTOMY;  Surgeon: Judeth Horn, MD;  Location: [place];  Service: General;  Laterality: N/A;   LAPAROTOMY N/A 04/02/2016   Procedure: Re-exploration of open abdomen, application of abdominal wound vac;  Surgeon: Greer Pickerel, MD;  Location: [place];  Service: General;  Laterality: N/A;   LAPAROTOMY N/A 04/04/2016   Procedure: EXPLORATORY LAPAROTOMY, PLACEMENT OF ABRA ABDOMINAL WALL CLOSURE SET ;  Surgeon: Greer Pickerel, MD;  Location: [place];  Service: General;  Laterality: N/A;   LAPAROTOMY N/A 04/18/2016   Procedure: EXPLORATORY LAPAROTOMY;  Surgeon: Erroll Luna, MD;  Location: [place];  Service: General;  Laterality: N/A;   NECK SURGERY  1994   repair disk   OMENTECTOMY N/A 03/28/2016   Procedure: OMENTECTOMY;  Surgeon: Judeth Horn, MD;  Location: [place];  Service: General;  Laterality: N/A;   ROBOTIC ASSISTED TOTAL HYSTERECTOMY WITH BILATERAL SALPINGO OOPHERECTOMY  11/18/2012   Procedure: ROBOTIC ASSISTED TOTAL HYSTERECTOMY WITH BILATERAL SALPINGO OOPHORECTOMY;  Surgeon: Imagene Gurney A. Alycia Rossetti, MD;  Location: [facility];  Service: Gynecology;  Laterality: N/A;   TONSILLECTOMY     as a child   VACUUM ASSISTED CLOSURE CHANGE N/A 03/30/2016   Procedure: ABDOMINAL VAC CHANGE;  Surgeon: Rolm Bookbinder, MD;  Location: [place];  Service:  General;  Laterality: N/A;   WOUND DEBRIDEMENT N/A 03/28/2016   Procedure: DEBRIDEMENT WOUND;  Surgeon: Judeth Horn, MD;  Location: [place];  Service: General;  Laterality: N/A;    There were no vitals filed for this visit.               Wound Therapy - 06/27/22 0001     Subjective Wounds arent painful. dressing fell off deeper wound but she had been keeping it clean.    Patient and Family Stated Goals wounds to heal    Date of Onset --   chronic for superior and inferior wound.  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  She ended up having a  colosomy and was discharged for follow up care in her home town in [place].  When she went to her MD there was feculent discharged and they sent her to the ER at [facility] where she was admitted and had several operations.  She stayed at [facility] from 03/28/2016 thruy 5/10 where she was discharged to IP rehab.  She stayed in [place] from 5/10 thru 05/23/2016 and states that her most superior wound has been there ever since this time.  The middle wound occured last month, she scrapped herself with a lavage while bathing.  Her and her husband have been taking care of the wounds, however the middle wound has about trippled in size therefore she went to the MD who sent her to therapy for wound care.    Evaluation and Treatment Procedures Explained to Patient/Family Yes    Evaluation and Treatment Procedures agreed to    Wound Properties Date First Assessed: 05/28/22 Time First Assessed: 1340 Wound Type: Other (Comment) , abrasion  Location: Abdomen Location Orientation: Right Wound Description (Comments): middle wound of the three Present on Admission: Yes   Dressing Type Silver hydrofiber;Transparent dressing    Dressing Changed Changed    Dressing Status Old drainage    Dressing Change Frequency PRN    Site / Wound Assessment Pale;Pink    % Wound base Red or Granulating 100%   following debridement   % Wound base Yellow/Fibrinous Exudate 0%    Margins Epibole (rolled edges)    Drainage Amount Minimal    Drainage Description Serous    Treatment Cleansed;Debridement (Selective)    Wound Properties Date First Assessed: 05/28/22 Time First Assessed: 1345 Location: Abdomen Location Orientation: Lower Wound Description (Comments): Rt side in abdominal fold Present on Admission: Yes   Dressing Type Silver hydrofiber    Dressing Changed Changed    Dressing Change Frequency PRN    % Wound base Red or Granulating 100%    Drainage Amount Minimal    Drainage Description Serous    Treatment Cleansed;Debridement (Selective)     Selective Debridement - Location wound bed and epibold edges    Selective Debridement - Tools Used Forceps    Selective Debridement - Tissue Removed slough    Wound Therapy - Clinical Statement Epiboled edges and slough debrided from middle wound.  Wound located in abdominal fold was able to be debrided and is appearing smalling with granulation present. Silver hydrofiber placed into wound.    Wound Therapy - Functional Problem List difficulty walking    Factors Delaying/Impairing Wound Healing Altered sensation;Infection - systemic/local;Multiple medical problems;Tobacco use;Polypharmacy    Hydrotherapy Plan Debridement;Dressing change;Patient/family education    Wound Therapy - Frequency  2X / week    Wound Therapy - Current Recommendations PT    Wound Plan Continue with debridement of epiboled edges.  Measure weekly.  Continue with debridement as necessary.    Dressing  Silverhydrofiber to both wounds followed by tegaderm                       PT Short Term Goals - 06/21/22 1658       PT SHORT TERM GOAL #1   Title PT superior and inferior wound to be healed.    Time 3    Period Weeks    Status Achieved    Target Date 06/18/22      PT SHORT TERM GOAL #2   Title Pt middle wound to be 100% granulated.    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   Adjustment  disorder with mixed anxiety and depressed mood    Debility 05/02/2016   Edema    Generalized weakness    Chronic pain syndrome    Hypoalbuminemia due to protein-calorie malnutrition (HCC)    OSA (obstructive sleep apnea)    Morbid obesity due to excess calories ([place])    S/P colostomy ([place])    Tracheostomy status ([place])    Tracheostomy care ([place])    Chronic obstructive pulmonary disease ([place])    Tobacco abuse    Essential hypertension    Bilateral knee pain    Dysphagia    Acute blood loss anemia    Hyponatremia    Thyroid activity decreased    Acute respiratory failure ([place])    Pressure ulcer 04/01/2016   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

## 2022-06-29 ENCOUNTER — Ambulatory Visit (HOSPITAL_COMMUNITY): Payer: Medicare Other | Admitting: Physical Therapy

## 2022-06-29 DIAGNOSIS — S31109D Unspecified open wound of abdominal wall, unspecified quadrant without penetration into peritoneal cavity, subsequent encounter: Secondary | ICD-10-CM

## 2022-06-29 NOTE — Therapy (Cosign Needed Addendum)
[place], [place], [address] Phone: [phone]   Fax:  [phone]  Wound Care Therapy Progress Note Reporting Period 05/28/22 to 06/29/22  See note below for Objective Data and Assessment of Progress/Goals.     Patient Details  Name: Christina Castaneda MRN: [id number] Date of Birth: [birth year] Referring Provider (PT): Mina Marble   Encounter Date: 06/29/2022   PT End of Session - 06/29/22 1230     Visit Number 10    Number of Visits 12    Date for PT Re-Evaluation 07/09/22    Authorization Type UHC medicare    Progress Note Due on Visit 20    PT Start Time 1120    PT Stop Time 1140    PT Time Calculation (min) 20 min    Activity Tolerance Patient tolerated treatment well    Behavior During Therapy WFL for tasks assessed/performed             Past Medical History:  Diagnosis Date   Asthma    mild   Atypical endometrial hyperplasia    COPD (chronic obstructive pulmonary disease) ([place])    Goiter    Gout    Heart murmur    Hypertension    Hypothyroidism    Vitamin B12 deficiency    Yeast infection    for last 3 weeks    Past Surgical History:  Procedure Laterality Date   ABDOMINAL WALL DEFECT REPAIR N/A 04/18/2016   Procedure: CLOSURE OF ABDOMEN;  Surgeon: Erroll Luna, MD;  Location: [place];  Service: General;  Laterality: N/A;   APPENDECTOMY     APPLICATION OF WOUND VAC N/A 04/02/2016   Procedure: application of wound vac+;  Surgeon: Greer Pickerel, MD;  Location: [place];  Service: General;  Laterality: N/A;   CHOLECYSTECTOMY  2009   gallstone removed   COLOSTOMY N/A 03/30/2016   Procedure: COLOSTOMY;  Surgeon: Rolm Bookbinder, MD;  Location: [place];  Service: General;  Laterality: N/A;   COLOSTOMY REVISION N/A 03/28/2016   Procedure: COLOSTOMY REVISION;  Surgeon: Judeth Horn, MD;  Location: [place];  Service: General;  Laterality: N/A;   COLOSTOMY REVISION N/A 03/30/2016   Procedure: COLON  RESECTION LEFT;  Surgeon: Rolm Bookbinder, MD;  Location: [place];  Service: General;  Laterality: N/A;   LAPAROTOMY N/A 03/28/2016   Procedure: EXPLORATORY LAPAROTOMY;  Surgeon: Judeth Horn, MD;  Location: [place];  Service: General;  Laterality: N/A;   LAPAROTOMY N/A 04/02/2016   Procedure: Re-exploration of open abdomen, application of abdominal wound vac;  Surgeon: Greer Pickerel, MD;  Location: [place];  Service: General;  Laterality: N/A;   LAPAROTOMY N/A 04/04/2016   Procedure: EXPLORATORY LAPAROTOMY, PLACEMENT OF ABRA ABDOMINAL WALL CLOSURE SET ;  Surgeon: Greer Pickerel, MD;  Location: [place];  Service: General;  Laterality: N/A;   LAPAROTOMY N/A 04/18/2016   Procedure: EXPLORATORY LAPAROTOMY;  Surgeon: Erroll Luna, MD;  Location: [place];  Service: General;  Laterality: N/A;   [place]   repair disk   OMENTECTOMY N/A 03/28/2016   Procedure: OMENTECTOMY;  Surgeon: Judeth Horn, MD;  Location: [place];  Service: General;  Laterality: N/A;   ROBOTIC ASSISTED TOTAL HYSTERECTOMY WITH BILATERAL SALPINGO OOPHERECTOMY  11/18/2012   Procedure: ROBOTIC ASSISTED TOTAL HYSTERECTOMY WITH BILATERAL SALPINGO OOPHORECTOMY;  Surgeon: Imagene Gurney A. Alycia Rossetti, MD;  Location: [facility];  Service: Gynecology;  Laterality: N/A;   TONSILLECTOMY     as a child   VACUUM ASSISTED  CLOSURE CHANGE N/A 03/30/2016   Procedure: ABDOMINAL VAC CHANGE;  Surgeon: Rolm Bookbinder, MD;  Location: [place];  Service: General;  Laterality: N/A;   WOUND DEBRIDEMENT N/A 03/28/2016   Procedure: DEBRIDEMENT WOUND;  Surgeon: Judeth Horn, MD;  Location: [place];  Service: General;  Laterality: N/A;    There were no vitals filed for this visit.               Wound Therapy - 06/29/22 1231     Subjective Dressings remained intact.  Doing well without pain; still taking antibiotics    Patient and Family Stated Goals wounds to heal    Date of Onset --   chronic for superior and inferior wound.  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  She ended up having a colosomy and was discharged for follow up care in her home town in [place].  When she went to her MD there was feculent discharged and they sent her to the ER at [facility] where she was admitted and had several operations.  She stayed at [facility] from 03/28/2016 thruy 5/10 where she was discharged to IP rehab.  She stayed in [place] from 5/10 thru 05/23/2016 and states that her most superior wound has been there ever since this time.  The middle wound occured last month, she scrapped herself with a lavage while bathing.  Her and her husband have been taking care of the wounds, however the middle wound has about trippled in size therefore she went to the MD who sent her to therapy for wound care.    Evaluation and Treatment Procedures Explained to Patient/Family Yes    Evaluation and Treatment Procedures agreed to    Wound Properties Date First Assessed: 05/28/22 Time First Assessed: 1340 Wound Type: Other (Comment) , abrasion  Location: Abdomen Location Orientation: Right Wound Description (Comments): middle wound of the three Present on Admission: Yes   Dressing Type Silver hydrofiber;Transparent dressing    Dressing Changed Changed    Dressing Status Old drainage    Dressing Change Frequency PRN    Site / Wound Assessment Pale;Pink    % Wound base Red or Granulating 100%  was 10% granulated   Wound Length (cm) 2.8 cm  was 3 cm    Wound Width (cm) 1.2 cm  was 1.2  cm   Wound Depth (cm) 0.1 cm  was  .2   Wound Volume (cm^3) 0.34 cm^3    Wound Surface Area (cm^2) 3.36 cm^2    Margins Epibole (rolled edges)    Drainage Amount Minimal    Drainage Description Serous    Treatment Cleansed    Wound Properties Date First Assessed: 05/28/22 Time First Assessed: 8315 Location: Abdomen Location Orientation: Lower Wound Description (Comments): Rt side in abdominal fold Present on Admission: Yes    Dressing Type Silver hydrofiber    Dressing Changed Changed    Dressing Status Old drainage;Intact    Dressing Change Frequency PRN    % Wound base Red or Granulating 100%    Wound Length (cm) 0.5 cm    Wound Width (cm) 0.5 cm    Wound Depth (cm) 1.5 cm    Wound Volume (cm^3) 0.38 cm^3    Wound Surface Area (cm^2) 0.25 cm^2    Drainage Amount Minimal    Drainage Description Serosanguineous    Treatment Cleansed  Selective Debridement - Location wound bed and epibold edges    Selective Debridement - Tools Used Forceps    Selective Debridement - Tissue Removed slough    Wound Therapy - Clinical Statement Cleansed only today without need for debridement.  Wounds measured with noted approximation in both wound locations.  Therapist brought scar silicone sheets to try over healed area to soften and prevent further wounds.  Instructed on where to purchase these if she notes improvement.  Continued with silver hydrofiber due to drainage and approximation of wounds.    Wound Therapy - Functional Problem List difficulty walking    Factors Delaying/Impairing Wound Healing Altered sensation;Infection - systemic/local;Multiple medical problems;Tobacco use;Polypharmacy    Hydrotherapy Plan Debridement;Dressing change;Patient/family education    Wound Therapy - Frequency 2X / week    Wound Therapy - Current Recommendations PT    Wound Plan Continue with debridement of epiboled edges.  Measure weekly.    Dressing  Silverhydrofiber to both wounds followed by tegaderm                       PT Short Term Goals - 06/21/22 1658       PT SHORT TERM GOAL #1   Title PT superior and inferior wound to be healed.    Time 3    Period Weeks    Status Achieved    Target Date 06/18/22      PT SHORT TERM GOAL #2   Title Pt middle wound to be 100% granulated.    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   Adjustment disorder with mixed anxiety and depressed mood    Debility 05/02/2016   Edema    Generalized weakness    Chronic pain syndrome    Hypoalbuminemia due to protein-calorie malnutrition (HCC)    OSA (obstructive sleep apnea)    Morbid obesity due to excess calories (HCC)    S/P colostomy ([place])    Tracheostomy status (HCC)    Tracheostomy care ([place])    Chronic obstructive pulmonary disease (HCC)    Tobacco abuse    Essential hypertension    Bilateral knee pain    Dysphagia    Acute blood loss anemia    Hyponatremia    Thyroid activity decreased    Acute respiratory failure ([place])    Pressure ulcer 04/01/2016   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 [place], [place], [address] Phone: [phone]   Fax:  [phone]  Name: Christina Castaneda MRN: [id number] Date of Birth: January 04, 1956

## 2022-07-04 ENCOUNTER — Ambulatory Visit (HOSPITAL_COMMUNITY): Payer: Medicare Other | Admitting: Physical Therapy

## 2022-07-04 DIAGNOSIS — S31109D Unspecified open wound of abdominal wall, unspecified quadrant without penetration into peritoneal cavity, subsequent encounter: Secondary | ICD-10-CM

## 2022-07-04 NOTE — Therapy (Signed)
[place], [place], [address] Phone: [phone]   Fax:  [phone]  Wound Care Therapy  Patient Details  Name: Christina Castaneda MRN: [id number] Date of Birth: [birth year] Referring Provider (PT): Mina Marble   Encounter Date: 07/04/2022   PT End of Session - 07/04/22 1015     Visit Number 11    Number of Visits 12    Date for PT Re-Evaluation 07/09/22    Authorization Type UHC medicare    Progress Note Due on Visit 20    PT Start Time 0945    PT Stop Time 1005    PT Time Calculation (min) 20 min    Activity Tolerance Patient tolerated treatment well    Behavior During Therapy [facility] for tasks assessed/performed             Past Medical History:  Diagnosis Date   Asthma    mild   Atypical endometrial hyperplasia    COPD (chronic obstructive pulmonary disease) ([place])    Goiter    Gout    Heart murmur    Hypertension    Hypothyroidism    Vitamin B12 deficiency    Yeast infection    for last 3 weeks    Past Surgical History:  Procedure Laterality Date   ABDOMINAL WALL DEFECT REPAIR N/A 04/18/2016   Procedure: CLOSURE OF ABDOMEN;  Surgeon: Erroll Luna, MD;  Location: [place];  Service: General;  Laterality: N/A;   APPENDECTOMY     APPLICATION OF WOUND VAC N/A 04/02/2016   Procedure: application of wound vac+;  Surgeon: Greer Pickerel, MD;  Location: [place];  Service: General;  Laterality: N/A;   CHOLECYSTECTOMY  2009   gallstone removed   COLOSTOMY N/A 03/30/2016   Procedure: COLOSTOMY;  Surgeon: Rolm Bookbinder, MD;  Location: [place];  Service: General;  Laterality: N/A;   COLOSTOMY REVISION N/A 03/28/2016   Procedure: COLOSTOMY REVISION;  Surgeon: Judeth Horn, MD;  Location: [place];  Service: General;  Laterality: N/A;   COLOSTOMY REVISION N/A 03/30/2016   Procedure: COLON RESECTION LEFT;  Surgeon: Rolm Bookbinder, MD;  Location: [place];  Service: General;  Laterality: N/A;   LAPAROTOMY N/A 03/28/2016    Procedure: EXPLORATORY LAPAROTOMY;  Surgeon: Judeth Horn, MD;  Location: [place];  Service: General;  Laterality: N/A;   LAPAROTOMY N/A 04/02/2016   Procedure: Re-exploration of open abdomen, application of abdominal wound vac;  Surgeon: Greer Pickerel, MD;  Location: [place];  Service: General;  Laterality: N/A;   LAPAROTOMY N/A 04/04/2016   Procedure: EXPLORATORY LAPAROTOMY, PLACEMENT OF ABRA ABDOMINAL WALL CLOSURE SET ;  Surgeon: Greer Pickerel, MD;  Location: [place];  Service: General;  Laterality: N/A;   LAPAROTOMY N/A 04/18/2016   Procedure: EXPLORATORY LAPAROTOMY;  Surgeon: Erroll Luna, MD;  Location: [place];  Service: General;  Laterality: N/A;   NECK SURGERY  1994   repair disk   OMENTECTOMY N/A 03/28/2016   Procedure: OMENTECTOMY;  Surgeon: Judeth Horn, MD;  Location: [place];  Service: General;  Laterality: N/A;   ROBOTIC ASSISTED TOTAL HYSTERECTOMY WITH BILATERAL SALPINGO OOPHERECTOMY  11/18/2012   Procedure: ROBOTIC ASSISTED TOTAL HYSTERECTOMY WITH BILATERAL SALPINGO OOPHORECTOMY;  Surgeon: Imagene Gurney A. Alycia Rossetti, MD;  [facility];  Service: Gynecology;  Laterality: N/A;   TONSILLECTOMY     as a child   VACUUM ASSISTED CLOSURE CHANGE N/A 03/30/2016   Procedure: ABDOMINAL VAC CHANGE;  Surgeon: Rolm Bookbinder, MD;  Location: [place];  Service:  General;  Laterality: N/A;   WOUND DEBRIDEMENT N/A 03/28/2016   Procedure: DEBRIDEMENT WOUND;  Surgeon: Judeth Horn, MD;  Location: [place];  Service: General;  Laterality: N/A;    There were no vitals filed for this visit.               Wound Therapy - 07/04/22 1016     Subjective Dressings remained intact.  Doing well without pain; still taking antibiotics    Patient and Family Stated Goals wounds to heal    Date of Onset --   chronic for superior and inferior wound.  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  She ended up having a colosomy  and was discharged for follow up care in her home town in [place].  When she went to her MD there was feculent discharged and they sent her to the ER at [facility] where she was admitted and had several operations.  She stayed at [facility] from 03/28/2016 thruy 5/10 where she was discharged to IP rehab.  She stayed in [place] from 5/10 thru 05/23/2016 and states that her most superior wound has been there ever since this time.  The middle wound occured last month, she scrapped herself with a lavage while bathing.  Her and her husband have been taking care of the wounds, however the middle wound has about trippled in size therefore she went to the MD who sent her to therapy for wound care.    Evaluation and Treatment Procedures Explained to Patient/Family Yes    Evaluation and Treatment Procedures agreed to    Wound Properties Date First Assessed: 05/28/22 Time First Assessed: 1340 Wound Type: Other (Comment) , abrasion  Location: Abdomen Location Orientation: Right Wound Description (Comments): middle wound of the three Present on Admission: Yes   Dressing Type Silver hydrofiber;Transparent dressing    Dressing Changed Changed    Dressing Status Old drainage    Dressing Change Frequency PRN    Site / Wound Assessment Pale;Pink    % Wound base Red or Granulating 100%    Margins Epibole (rolled edges)    Drainage Amount Minimal    Drainage Description Serosanguineous    Treatment Cleansed    Wound Properties Date First Assessed: 05/28/22 Time First Assessed: 1345 Location: Abdomen Location Orientation: Lower Wound Description (Comments): Rt side in abdominal fold Present on Admission: Yes   Dressing Type Silver hydrofiber    Dressing Changed Changed    Dressing Status Old drainage;Intact    Dressing Change Frequency PRN    % Wound base Red or Granulating 100%    Drainage Amount Minimal    Drainage Description Serosanguineous    Treatment Cleansed    Selective Debridement - Location wound bed and epibold edges     Selective Debridement - Tools Used Forceps    Selective Debridement - Tissue Removed slough    Wound Therapy - Clinical Statement Cleansed only today without need for debridement.  Pt reports the silicone sheets were helpful over her scar but has not ordered any.  Educated to continue to massage area and apply lotion as noted dried out areas along scar line.  Some maceration around wound, however only changed once this week due to scheuling.  Encouraged to change it herself more frequently when drainage comes through.  Continued with silver hydrofiber due to drainage and approximation of wounds.  Wound Therapy - Functional Problem List difficulty walking    Factors Delaying/Impairing Wound Healing Altered sensation;Infection - systemic/local;Multiple medical problems;Tobacco use;Polypharmacy    Hydrotherapy Plan Debridement;Dressing change;Patient/family education    Wound Therapy - Frequency 2X / week    Wound Therapy - Current Recommendations PT    Wound Plan Continue with debridement of epiboled edges.  Measure weekly.    Dressing  Silverhydrofiber to both wounds followed by tegaderm    Manual Therapy self care charge X1                       PT Short Term Goals - 06/21/22 1658       PT SHORT TERM GOAL #1   Title PT superior and inferior wound to be healed.    Time 3    Period Weeks    Status Achieved    Target Date 06/18/22      PT SHORT TERM GOAL #2   Title Pt middle wound to be 100% granulated.    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   Adjustment disorder with mixed anxiety and depressed mood    Debility 05/02/2016   Edema    Generalized weakness    Chronic pain syndrome    Hypoalbuminemia due to protein-calorie malnutrition (HCC)    OSA (obstructive sleep apnea)    Morbid obesity due to excess calories ([place])    S/P colostomy ([place])    Tracheostomy status ([place])    Tracheostomy care ([place])    Chronic obstructive pulmonary disease ([place])    Tobacco abuse    Essential hypertension    Bilateral knee pain    Dysphagia    Acute blood loss anemia    Hyponatremia    Thyroid activity decreased    Acute respiratory failure ([place])    Pressure ulcer 04/01/2016   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) 03/28/2016   Complex endometrial hyperplasia 11/12/2012   Obesity 11/12/2012    Christina Castaneda, PTA 07/04/2022, 10:24 AM  Christina Castaneda, [place], [address] Phone: [phone]   Fax:  [phone]  Name: Christina Castaneda MRN: [id number] Date of Birth: 05-22-1956

## 2022-07-06 ENCOUNTER — Ambulatory Visit (HOSPITAL_COMMUNITY): Payer: Medicare Other

## 2022-07-06 ENCOUNTER — Encounter (HOSPITAL_COMMUNITY): Payer: Self-pay

## 2022-07-06 DIAGNOSIS — S31109D Unspecified open wound of abdominal wall, unspecified quadrant without penetration into peritoneal cavity, subsequent encounter: Secondary | ICD-10-CM | POA: Diagnosis not present

## 2022-07-06 NOTE — Therapy (Addendum)
[place], [place], [address] Phone: [phone]   Fax:  [phone]  Wound Care Therapy  Patient Details  Name: Christina Castaneda MRN: [id number] Date of Birth: 01-18-1956 Referring Provider (PT): Mina Marble   Encounter Date: 07/06/2022   PT End of Session - 07/06/22 0922     Visit Number 12    Number of Visits 16    Date for PT Re-Evaluation 08/03/22    Authorization Type UHC medicare    Progress Note Due on Visit 20    PT Start Time [phone]   late arrival   PT Stop Time 0900    PT Time Calculation (min) 28 min    Activity Tolerance Patient tolerated treatment well    Behavior During Therapy [facility] for tasks assessed/performed             Past Medical History:  Diagnosis Date   Asthma    mild   Atypical endometrial hyperplasia    COPD (chronic obstructive pulmonary disease) ([place])    Goiter    Gout    Heart murmur    Hypertension    Hypothyroidism    Vitamin B12 deficiency    Yeast infection    for last 3 weeks    Past Surgical History:  Procedure Laterality Date   ABDOMINAL WALL DEFECT REPAIR N/A 04/18/2016   Procedure: CLOSURE OF ABDOMEN;  Surgeon: Erroll Luna, MD;  Location: [place];  Service: General;  Laterality: N/A;   APPENDECTOMY     APPLICATION OF WOUND VAC N/A 04/02/2016   Procedure: application of wound vac+;  Surgeon: Greer Pickerel, MD;  Location: [place];  Service: General;  Laterality: N/A;   CHOLECYSTECTOMY  2009   gallstone removed   COLOSTOMY N/A 03/30/2016   Procedure: COLOSTOMY;  Surgeon: Rolm Bookbinder, MD;  Location: [place];  Service: General;  Laterality: N/A;   COLOSTOMY REVISION N/A 03/28/2016   Procedure: COLOSTOMY REVISION;  Surgeon: Judeth Horn, MD;  Location: [place];  Service: General;  Laterality: N/A;   COLOSTOMY REVISION N/A 03/30/2016   Procedure: COLON RESECTION LEFT;  Surgeon: Rolm Bookbinder, MD;  Location: [place];  Service: General;  Laterality: N/A;   LAPAROTOMY  N/A 03/28/2016   Procedure: EXPLORATORY LAPAROTOMY;  Surgeon: Judeth Horn, MD;  Location: [place];  Service: General;  Laterality: N/A;   LAPAROTOMY N/A 04/02/2016   Procedure: Re-exploration of open abdomen, application of abdominal wound vac;  Surgeon: Greer Pickerel, MD;  Location: [place];  Service: General;  Laterality: N/A;   LAPAROTOMY N/A 04/04/2016   Procedure: EXPLORATORY LAPAROTOMY, PLACEMENT OF ABRA ABDOMINAL WALL CLOSURE SET ;  Surgeon: Greer Pickerel, MD;  Location: [place];  Service: General;  Laterality: N/A;   LAPAROTOMY N/A 04/18/2016   Procedure: EXPLORATORY LAPAROTOMY;  Surgeon: Erroll Luna, MD;  Location: [place];  Service: General;  Laterality: N/A;   NECK SURGERY  1994   repair disk   OMENTECTOMY N/A 03/28/2016   Procedure: OMENTECTOMY;  Surgeon: Judeth Horn, MD;  Location: [place];  Service: General;  Laterality: N/A;   ROBOTIC ASSISTED TOTAL HYSTERECTOMY WITH BILATERAL SALPINGO OOPHERECTOMY  11/18/2012   Procedure: ROBOTIC ASSISTED TOTAL HYSTERECTOMY WITH BILATERAL SALPINGO OOPHORECTOMY;  Surgeon: Imagene Gurney A. Alycia Rossetti, MD;  [facility];  Service: Gynecology;  Laterality: N/A;   TONSILLECTOMY     as a child   VACUUM ASSISTED CLOSURE CHANGE N/A 03/30/2016   Procedure: ABDOMINAL VAC CHANGE;  Surgeon: Rolm Bookbinder, MD;  [facility];  Service: General;  Laterality: N/A;   WOUND DEBRIDEMENT N/A 03/28/2016   Procedure: DEBRIDEMENT WOUND;  Surgeon: Judeth Horn, MD;  Location: [place];  Service: General;  Laterality: N/A;    There were no vitals filed for this visit.               Wound Therapy - 07/06/22 0001     Subjective Pt reports increased drainage.  Arrived iwth dressings intact.  Reports today last day of antibiotics.  No reports of pain today.    Patient and Family Stated Goals wounds to heal    Date of Onset --   chronic for superior and inferior wound.  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  She ended up having a colosomy and was discharged for follow up care in her home town in [place].  When she went to her MD there was feculent discharged and they sent her to the ER at [facility] where she was admitted and had several operations.  She stayed at [facility] from 03/28/2016 thruy 5/10 where she was discharged to IP rehab.  She stayed in [place] from 5/10 thru 05/23/2016 and states that her most superior wound has been there ever since this time.  The middle wound occured last month, she scrapped herself with a lavage while bathing.  Her and her husband have been taking care of the wounds, however the middle wound has about trippled in size therefore she went to the MD who sent her to therapy for wound care.    Evaluation and Treatment Procedures Explained to Patient/Family Yes    Evaluation and Treatment Procedures agreed to    Wound Properties Date First Assessed: 05/28/22 Time First Assessed: 1340 Wound Type: Other (Comment) , abrasion  Location: Abdomen Location Orientation: Right Wound Description (Comments): middle wound of the three Present on Admission: Yes   Wound Image Images linked: 1    Dressing Type Silver hydrofiber   silver hydrofiber, 2x2, foam, medipore tape   Dressing Changed Changed    Dressing Status Old drainage    Dressing Change Frequency PRN    Site / Wound Assessment Pale;Pink;Bleeding   bleeding following selective debridement   % Wound base Red or Granulating 100%    Wound Length (cm) 2.3 cm    Wound Width (cm) 3 cm    Wound Depth (cm) 0.1 cm    Wound Volume (cm^3) 0.69 cm^3    Wound Surface Area (cm^2) 6.9 cm^2    Margins Epibole (rolled edges)    Drainage Amount Minimal    Drainage Description Serosanguineous    Treatment Cleansed;Debridement (Selective)    Wound Properties Date First Assessed: 05/28/22 Time First Assessed: 1345 Location: Abdomen Location Orientation: Lower Wound Description (Comments): Rt side in abdominal fold Present  on Admission: Yes   Wound Image Images linked: 1    Dressing Type Silver hydrofiber;Transparent dressing    Dressing Changed Changed    Dressing Status Old drainage;Intact    Dressing Change Frequency PRN    % Wound base Red or Granulating 100%    Wound Length (cm) 1.2 cm    Wound Width (cm) 0.8 cm    Wound Surface Area (cm^2) 0.96 cm^2    Drainage Amount Minimal    Drainage Description Serosanguineous    Treatment Cleansed    Selective Debridement - Location wound bed and epibold  edges    Selective Debridement - Tools Used Forceps;Scalpel    Selective Debridement - Tissue Removed slough    Wound Therapy - Clinical Statement Measurements taken wiht increase in size since last measured and reports of increased drainage from pt.  Selective debridement to address epibole edges on superior wound and adherent slough on wound bed.  Applied foam this session as well.  Pt encouraged to change dressings at home more frequently as reoprts increased drainage.  Evaluation therapist looked at wound as well today, due to minimal debridement necessary plan to reduce to 1x/week.  Pt encouraged to contact MD to see if silver nitrate will be helpful towards healing.  No reports of pain through session.  Did continue with silver hydrofiber to address drainage.    Wound Therapy - Functional Problem List difficulty walking    Factors Delaying/Impairing Wound Healing Altered sensation;Infection - systemic/local;Multiple medical problems;Tobacco use;Polypharmacy    Hydrotherapy Plan Debridement;Dressing change;Patient/family education    Wound Therapy - Frequency --   1x/week   Wound Therapy - Current Recommendations PT    Wound Plan Continue with debridement of epiboled edges.  Measure weekly.    Dressing  Silverhydrofiber to both wounds followed by tegaderm                       PT Short Term Goals - 06/21/22 1658       PT SHORT TERM GOAL #1   Title PT superior and inferior wound to be healed.     Time 3    Period Weeks    Status Achieved    Target Date 06/18/22      PT SHORT TERM GOAL #2   Title Pt middle wound to be 100% granulated.    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   Adjustment disorder with mixed anxiety and depressed mood    Debility 05/02/2016   Edema    Generalized weakness    Chronic pain syndrome    Hypoalbuminemia due to protein-calorie malnutrition (HCC)    OSA (obstructive sleep apnea)    Morbid obesity due to excess calories (HCC)    S/P colostomy ([place])    Tracheostomy status (HCC)    Tracheostomy care ([place])    Chronic obstructive pulmonary disease (HCC)    Tobacco abuse    Essential hypertension    Bilateral knee pain    Dysphagia    Acute blood loss anemia    Hyponatremia    Thyroid activity decreased    Acute respiratory failure (HCC)    Pressure ulcer 04/01/2016   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

## 2022-07-06 NOTE — Addendum Note (Signed)
Addended by: Leeroy Cha on: 07/06/2022 01:54 PM   Modules accepted: Orders

## 2022-07-08 NOTE — Addendum Note (Signed)
Addended by: Leeroy Cha on: 07/08/2022 11:21 AM   Modules accepted: Orders

## 2022-07-09 ENCOUNTER — Ambulatory Visit (HOSPITAL_COMMUNITY): Payer: Medicare Other | Admitting: Physical Therapy

## 2022-07-12 ENCOUNTER — Ambulatory Visit (HOSPITAL_COMMUNITY): Payer: Medicare Other | Admitting: Physical Therapy

## 2022-07-12 DIAGNOSIS — S31109D Unspecified open wound of abdominal wall, unspecified quadrant without penetration into peritoneal cavity, subsequent encounter: Secondary | ICD-10-CM

## 2022-07-12 NOTE — Therapy (Signed)
[place], [place], [address] Phone: [phone]   Fax:  [phone]  Wound Care Therapy  Patient Details  Name: Christina Castaneda MRN: [id number] Date of Birth: 18-Jul-1956 Referring Provider (PT): Mina Marble   Encounter Date: 07/12/2022   PT End of Session - 07/12/22 1353     Visit Number 13    Number of Visits 16    Date for PT Re-Evaluation 08/03/22    Authorization Type UHC medicare    Progress Note Due on Visit 20    PT Start Time 1305    PT Stop Time 1330    PT Time Calculation (min) 25 min    Activity Tolerance Patient tolerated treatment well    Behavior During Therapy [facility] for tasks assessed/performed             Past Medical History:  Diagnosis Date   Asthma    mild   Atypical endometrial hyperplasia    COPD (chronic obstructive pulmonary disease) ([place])    Goiter    Gout    Heart murmur    Hypertension    Hypothyroidism    Vitamin B12 deficiency    Yeast infection    for last 3 weeks    Past Surgical History:  Procedure Laterality Date   ABDOMINAL WALL DEFECT REPAIR N/A 04/18/2016   Procedure: CLOSURE OF ABDOMEN;  Surgeon: Erroll Luna, MD;  Location: [place];  Service: General;  Laterality: N/A;   APPENDECTOMY     APPLICATION OF WOUND VAC N/A 04/02/2016   Procedure: application of wound vac+;  Surgeon: Greer Pickerel, MD;  Location: [place];  Service: General;  Laterality: N/A;   CHOLECYSTECTOMY  2009   gallstone removed   COLOSTOMY N/A 03/30/2016   Procedure: COLOSTOMY;  Surgeon: Rolm Bookbinder, MD;  Location: [place];  Service: General;  Laterality: N/A;   COLOSTOMY REVISION N/A 03/28/2016   Procedure: COLOSTOMY REVISION;  Surgeon: Judeth Horn, MD;  Location: [place];  Service: General;  Laterality: N/A;   COLOSTOMY REVISION N/A 03/30/2016   Procedure: COLON RESECTION LEFT;  Surgeon: Rolm Bookbinder, MD;  Location: [place];  Service: General;  Laterality: N/A;   LAPAROTOMY N/A 03/28/2016    Procedure: EXPLORATORY LAPAROTOMY;  Surgeon: Judeth Horn, MD;  Location: [place];  Service: General;  Laterality: N/A;   LAPAROTOMY N/A 04/02/2016   Procedure: Re-exploration of open abdomen, application of abdominal wound vac;  Surgeon: Greer Pickerel, MD;  Location: [place];  Service: General;  Laterality: N/A;   LAPAROTOMY N/A 04/04/2016   Procedure: EXPLORATORY LAPAROTOMY, PLACEMENT OF ABRA ABDOMINAL WALL CLOSURE SET ;  Surgeon: Greer Pickerel, MD;  Location: [place];  Service: General;  Laterality: N/A;   LAPAROTOMY N/A 04/18/2016   Procedure: EXPLORATORY LAPAROTOMY;  Surgeon: Erroll Luna, MD;  Location: [place];  Service: General;  Laterality: N/A;   NECK SURGERY  1994   repair disk   OMENTECTOMY N/A 03/28/2016   Procedure: OMENTECTOMY;  Surgeon: Judeth Horn, MD;  Location: [place];  Service: General;  Laterality: N/A;   ROBOTIC ASSISTED TOTAL HYSTERECTOMY WITH BILATERAL SALPINGO OOPHERECTOMY  11/18/2012   Procedure: ROBOTIC ASSISTED TOTAL HYSTERECTOMY WITH BILATERAL SALPINGO OOPHORECTOMY;  Surgeon: Imagene Gurney A. Alycia Rossetti, MD;  Location: [facility];  Service: Gynecology;  Laterality: N/A;   TONSILLECTOMY     as a child   VACUUM ASSISTED CLOSURE CHANGE N/A 03/30/2016   Procedure: ABDOMINAL VAC CHANGE;  Surgeon: Rolm Bookbinder, MD;  Location: [place];  Service:  General;  Laterality: N/A;   WOUND DEBRIDEMENT N/A 03/28/2016   Procedure: DEBRIDEMENT WOUND;  Surgeon: Judeth Horn, MD;  Location: [place];  Service: General;  Laterality: N/A;    There were no vitals filed for this visit.               Wound Therapy - 07/12/22 1353     Subjective pt states she's changed it a couple times.  States no pain or issues.  Going to [employer] today to get the silver hydrofiber (therapist wrote down what to get)    Patient and Family Stated Goals wounds to heal    Date of Onset --   chronic for superior and inferior wound.  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  She ended up having a colosomy and was discharged for follow up care in her home town in [place].  When she went to her MD there was feculent discharged and they sent her to the ER at [facility] where she was admitted and had several operations.  She stayed at [facility] from 03/28/2016 thruy 5/10 where she was discharged to IP rehab.  She stayed in [place] from 5/10 thru 05/23/2016 and states that her most superior wound has been there ever since this time.  The middle wound occured last month, she scrapped herself with a lavage while bathing.  Her and her husband have been taking care of the wounds, however the middle wound has about trippled in size therefore she went to the MD who sent her to therapy for wound care.    Evaluation and Treatment Procedures Explained to Patient/Family Yes    Evaluation and Treatment Procedures agreed to    Wound Properties Date First Assessed: 05/28/22 Time First Assessed: 1340 Wound Type: Other (Comment) , abrasion  Location: Abdomen Location Orientation: Right Wound Description (Comments): middle wound of the three Present on Admission: Yes   Wound Image Images linked: 1    Dressing Type Silver hydrofiber   silver hydrofiber, 2x2, foam, medipore tape   Dressing Changed Changed    Dressing Status Old drainage    Dressing Change Frequency PRN    Site / Wound Assessment Pale;Pink;Bleeding   bleeding following selective debridement   % Wound base Red or Granulating 100%    Wound Length (cm) 3.6 cm    Wound Width (cm) 1.6 cm    Wound Depth (cm) 0.1 cm    Wound Volume (cm^3) 0.58 cm^3    Wound Surface Area (cm^2) 5.76 cm^2    Margins Epibole (rolled edges)    Drainage Amount Minimal    Drainage Description Serosanguineous    Treatment Cleansed;Debridement (Selective)    Wound Properties Date First Assessed: 05/28/22 Time First Assessed: 1345 Location: Abdomen Location Orientation: Lower Wound Description (Comments): Rt side in  abdominal fold Present on Admission: Yes   Wound Image Images linked: 1    Dressing Type Silver hydrofiber;Transparent dressing    Dressing Changed Changed    Dressing Status Old drainage;Intact    Dressing Change Frequency PRN    % Wound base Red or Granulating 100%    Wound Length (cm) 1 cm    Wound Width (cm) 0.8 cm    Wound Surface Area (cm^2) 0.8 cm^2    Drainage Amount Minimal    Drainage Description Serosanguineous    Treatment Cleansed    Selective Debridement -  Location wound bed and epibold edges    Selective Debridement - Tools Used Forceps;Scalpel    Selective Debridement - Tissue Removed slough    Wound Therapy - Clinical Statement Wounds are healing slowly but the one closest to belly button is healing faster.  Larger wound fails to approximate due to epibole borders.  PT is returning to MD today and will ask about getting silver nitrate treament to reduce epibole border.  continued with silver hydrofiber and pt is to check pharmacy today to get this dressing for home.  Also placed small piece of 1/2" foam over at stretch from medipore to give some compression.    Wound Therapy - Functional Problem List difficulty walking    Factors Delaying/Impairing Wound Healing Altered sensation;Infection - systemic/local;Multiple medical problems;Tobacco use;Polypharmacy    Hydrotherapy Plan Debridement;Dressing change;Patient/family education    Wound Therapy - Frequency --   1x/week   Wound Therapy - Current Recommendations PT    Wound Plan Continue with debridement of epiboled edges.  Measure weekly.    Dressing  Silverhydrofiber to both wounds followed by medipore tape                       PT Short Term Goals - 06/21/22 1658       PT SHORT TERM GOAL #1   Title PT superior and inferior wound to be healed.    Time 3    Period Weeks    Status Achieved    Target Date 06/18/22      PT SHORT TERM GOAL #2   Title Pt middle wound to be 100% granulated.    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   Adjustment disorder with mixed anxiety and depressed mood    Debility 05/02/2016   Edema    Generalized weakness    Chronic pain syndrome    Hypoalbuminemia due to protein-calorie malnutrition (HCC)    OSA (obstructive sleep apnea)    Morbid obesity due to excess calories (HCC)    S/P colostomy ([place])    Tracheostomy status (HCC)    Tracheostomy care ([place])    Chronic obstructive pulmonary disease (HCC)    Tobacco abuse    Essential hypertension    Bilateral knee pain    Dysphagia    Acute blood loss anemia    Hyponatremia    Thyroid activity decreased    Acute respiratory failure ([place])    Pressure ulcer 04/01/2016   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

## 2022-07-17 ENCOUNTER — Ambulatory Visit (HOSPITAL_COMMUNITY): Payer: Medicare Other | Admitting: Physical Therapy

## 2022-07-19 ENCOUNTER — Other Ambulatory Visit (HOSPITAL_COMMUNITY)
Admission: RE | Admit: 2022-07-19 | Discharge: 2022-07-19 | Disposition: A | Discharge status: Home / Self Care | Payer: Medicare Other | Attending: Family Medicine | Admitting: Family Medicine

## 2022-07-19 ENCOUNTER — Ambulatory Visit (HOSPITAL_COMMUNITY): Payer: Medicare Other | Admitting: Physical Therapy

## 2022-07-19 DIAGNOSIS — S31109S Unspecified open wound of abdominal wall, unspecified quadrant without penetration into peritoneal cavity, sequela: Secondary | ICD-10-CM | POA: Insufficient documentation

## 2022-07-19 DIAGNOSIS — S31109D Unspecified open wound of abdominal wall, unspecified quadrant without penetration into peritoneal cavity, subsequent encounter: Secondary | ICD-10-CM | POA: Diagnosis not present

## 2022-07-19 NOTE — Therapy (Signed)
[place], [place], [address] Phone: [phone]   Fax:  [phone]  Wound Care Therapy  Patient Details  Name: Christina Castaneda MRN: [id number] Date of Birth: August 09, 1956 Referring Provider (PT): Mina Marble   Encounter Date: 07/19/2022   PT End of Session - 07/19/22 0852     Visit Number 14    Number of Visits 16    Date for PT Re-Evaluation 08/03/22    Authorization Type UHC medicare    Progress Note Due on Visit 20    PT Start Time 0818    PT Stop Time 0845    PT Time Calculation (min) 27 min    Activity Tolerance Patient tolerated treatment well    Behavior During Therapy [facility] for tasks assessed/performed             Past Medical History:  Diagnosis Date   Asthma    mild   Atypical endometrial hyperplasia    COPD (chronic obstructive pulmonary disease) ([place])    Goiter    Gout    Heart murmur    Hypertension    Hypothyroidism    Vitamin B12 deficiency    Yeast infection    for last 3 weeks    Past Surgical History:  Procedure Laterality Date   ABDOMINAL WALL DEFECT REPAIR N/A 04/18/2016   Procedure: CLOSURE OF ABDOMEN;  Surgeon: Erroll Luna, MD;  Location: [place];  Service: General;  Laterality: N/A;   APPENDECTOMY     APPLICATION OF WOUND VAC N/A 04/02/2016   Procedure: application of wound vac+;  Surgeon: Greer Pickerel, MD;  Location: [place];  Service: General;  Laterality: N/A;   CHOLECYSTECTOMY  2009   gallstone removed   COLOSTOMY N/A 03/30/2016   Procedure: COLOSTOMY;  Surgeon: Rolm Bookbinder, MD;  Location: [place];  Service: General;  Laterality: N/A;   COLOSTOMY REVISION N/A 03/28/2016   Procedure: COLOSTOMY REVISION;  Surgeon: Judeth Horn, MD;  Location: [place];  Service: General;  Laterality: N/A;   COLOSTOMY REVISION N/A 03/30/2016   Procedure: COLON RESECTION LEFT;  Surgeon: Rolm Bookbinder, MD;  Location: [place];  Service: General;  Laterality: N/A;   LAPAROTOMY N/A 03/28/2016    Procedure: EXPLORATORY LAPAROTOMY;  Surgeon: Judeth Horn, MD;  Location: [place];  Service: General;  Laterality: N/A;   LAPAROTOMY N/A 04/02/2016   Procedure: Re-exploration of open abdomen, application of abdominal wound vac;  Surgeon: Greer Pickerel, MD;  Location: [place];  Service: General;  Laterality: N/A;   LAPAROTOMY N/A 04/04/2016   Procedure: EXPLORATORY LAPAROTOMY, PLACEMENT OF ABRA ABDOMINAL WALL CLOSURE SET ;  Surgeon: Greer Pickerel, MD;  Location: [place];  Service: General;  Laterality: N/A;   LAPAROTOMY N/A 04/18/2016   Procedure: EXPLORATORY LAPAROTOMY;  Surgeon: Erroll Luna, MD;  Location: [place];  Service: General;  Laterality: N/A;   NECK SURGERY  1994   repair disk   OMENTECTOMY N/A 03/28/2016   Procedure: OMENTECTOMY;  Surgeon: Judeth Horn, MD;  Location: [place];  Service: General;  Laterality: N/A;   ROBOTIC ASSISTED TOTAL HYSTERECTOMY WITH BILATERAL SALPINGO OOPHERECTOMY  11/18/2012   Procedure: ROBOTIC ASSISTED TOTAL HYSTERECTOMY WITH BILATERAL SALPINGO OOPHORECTOMY;  Surgeon: Imagene Gurney A. Alycia Rossetti, MD;  Location: [facility];  Service: Gynecology;  Laterality: N/A;   TONSILLECTOMY     as a child   VACUUM ASSISTED CLOSURE CHANGE N/A 03/30/2016   Procedure: ABDOMINAL VAC CHANGE;  Surgeon: Rolm Bookbinder, MD;  Location: [place];  Service:  General;  Laterality: N/A;   WOUND DEBRIDEMENT N/A 03/28/2016   Procedure: DEBRIDEMENT WOUND;  Surgeon: Judeth Horn, MD;  Location: [place];  Service: General;  Laterality: N/A;    There were no vitals filed for this visit.               Wound Therapy - 07/19/22 0001     Subjective Pt states that she can not feel anything in her stomach    Date of Onset --   chronic for superior and inferior wound.  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  She ended up having a colosomy and was discharged for follow up care in her home town in [place].  When  she went to her MD there was feculent discharged and they sent her to the ER at [facility] where she was admitted and had several operations.  She stayed at [facility] from 03/28/2016 thruy 5/10 where she was discharged to IP rehab.  She stayed in [place] from 5/10 thru 05/23/2016 and states that her most superior wound has been there ever since this time.  The middle wound occured last month, she scrapped herself with a lavage while bathing.  Her and her husband have been taking care of the wounds, however the middle wound has about trippled in size therefore she went to the MD who sent her to therapy for wound care.    Evaluation and Treatment Procedures Explained to Patient/Family Yes    Evaluation and Treatment Procedures agreed to    Wound Properties Date First Assessed: 05/28/22 Time First Assessed: 1340 Wound Type: Other (Comment) , abrasion  Location: Abdomen Location Orientation: Right Wound Description (Comments): middle wound of the three Present on Admission: Yes   Dressing Type Silver dressings    Dressing Changed Changed    Dressing Status Old drainage    Dressing Change Frequency PRN    Site / Wound Assessment Pale    % Wound base Red or Granulating 20%    % Wound base Other/Granulation Tissue (Comment) 80%    Wound Length (cm) 3.7 cm    Wound Width (cm) 1.5 cm    Wound Depth (cm) 0.1 cm    Wound Volume (cm^3) 0.56 cm^3    Wound Surface Area (cm^2) 5.55 cm^2    Margins Epibole (rolled edges)    Drainage Amount Minimal    Drainage Description Serosanguineous    Treatment Cleansed;Debridement (Selective)    Wound Properties Date First Assessed: 05/28/22 Time First Assessed: 1345 Location: Abdomen Location Orientation: Lower Wound Description (Comments): Rt side in abdominal fold Present on Admission: Yes   Dressing Type Silver hydrofiber    Dressing Changed Changed    Dressing Status Old drainage    Dressing Change Frequency PRN    % Wound base Red or Granulating 100%    Wound Length (cm) 1 cm     Wound Width (cm) 0.8 cm    Wound Depth (cm) 1.8 cm    Wound Volume (cm^3) 1.44 cm^3    Wound Surface Area (cm^2) 0.8 cm^2    Drainage Amount Minimal    Drainage Description Serosanguineous    Treatment Cleansed    Selective Debridement - Location entire wound bed of first wound    Selective Debridement - Tools Used Forceps;Scalpel    Selective Debridement - Tissue Removed slough/eschar    Wound Therapy - Clinical Statement  Wounds are the same size.  Requested order to culture wound.  Largest wound is covered with a thin layer of eschar.  Therapist used forceps to remove epiboled edges and scapel to scrap entire wound surface until wound was bleeding well to promote healing.    Wound Therapy - Functional Problem List difficulty walking    Factors Delaying/Impairing Wound Healing Altered sensation;Infection - systemic/local;Multiple medical problems;Tobacco use;Polypharmacy    Hydrotherapy Plan Debridement;Dressing change;Patient/family education    Wound Therapy - Frequency --   1x wk   Wound Therapy - Current Recommendations PT    Wound Plan Continue with debridement of epiboled edges.  Measure weekly.    Dressing  Silverhydrofiber to both wounds followed by1/4" foam and tegaderm                       PT Short Term Goals - 07/19/22 0851       PT SHORT TERM GOAL #1   Title PT superior and inferior wound to be healed.    Time 3    Period Weeks    Status Achieved    Target Date 06/18/22      PT SHORT TERM GOAL #2   Title Pt middle wound to be 100% granulated.    Time 3    Period Weeks    Status Achieved    Target Date 06/18/22               PT Long Term Goals - 07/19/22 0851       PT LONG TERM GOAL #1   Title All wounds to be granulated    Time 6    Period Weeks    Status On-going    Target Date 07/09/22                   Plan - 07/19/22 0851     Clinical Impression Statement as above    Personal Factors and Comorbidities Comorbidity  3+    Comorbidities tobacco use, mobid obesity, CVI, lymphedema    Examination-Activity Limitations Bathing;Dressing;Hygiene/Grooming    Examination-Participation Restrictions Community Activity    Stability/Clinical Decision Making Stable/Uncomplicated    Rehab Potential Good    PT Frequency 2x / week    PT Duration 6 weeks    PT Treatment/Interventions Other (comment)   debridement and dressing change as needed.   PT Next Visit Plan cleanse moisturize and aggresively  debride all areas followed by dressing.             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   Adjustment disorder with mixed anxiety and depressed mood    Debility 05/02/2016   Edema    Generalized weakness    Chronic pain syndrome    Hypoalbuminemia due to protein-calorie malnutrition (HCC)    OSA (obstructive sleep apnea)    Morbid obesity due to excess calories (HCC)    S/P colostomy ([place])    Tracheostomy status ([place])    Tracheostomy care ([place])    Chronic obstructive pulmonary disease ([place])    Tobacco abuse    Essential hypertension    Bilateral knee pain    Dysphagia  Acute blood loss anemia    Hyponatremia    Thyroid activity decreased    Acute respiratory failure (HCC)    Pressure ulcer 04/01/2016   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

## 2022-07-20 LAB — AEROBIC CULTURE W GRAM STAIN (SUPERFICIAL SPECIMEN)

## 2022-07-21 LAB — AEROBIC CULTURE W GRAM STAIN (SUPERFICIAL SPECIMEN)
Culture: NO GROWTH
Gram Stain: NONE SEEN

## 2022-07-22 LAB — AEROBIC CULTURE W GRAM STAIN (SUPERFICIAL SPECIMEN)

## 2022-07-26 ENCOUNTER — Ambulatory Visit (HOSPITAL_COMMUNITY): Payer: Medicare Other | Admitting: Physical Therapy

## 2022-07-31 ENCOUNTER — Ambulatory Visit (HOSPITAL_COMMUNITY): Payer: Medicare Other | Admitting: Physical Therapy

## 2022-08-01 ENCOUNTER — Other Ambulatory Visit (HOSPITAL_COMMUNITY): Payer: Self-pay | Admitting: Family Medicine

## 2022-08-01 DIAGNOSIS — R079 Chest pain, unspecified: Secondary | ICD-10-CM

## 2022-08-02 ENCOUNTER — Ambulatory Visit (HOSPITAL_COMMUNITY): Payer: Medicare Other | Admitting: Physical Therapy

## 2022-08-06 ENCOUNTER — Ambulatory Visit (HOSPITAL_COMMUNITY): Payer: Medicare Other | Admitting: Physical Therapy

## 2022-08-09 ENCOUNTER — Ambulatory Visit (HOSPITAL_COMMUNITY): Payer: Medicare Other | Admitting: Physical Therapy

## 2022-08-13 ENCOUNTER — Ambulatory Visit (HOSPITAL_COMMUNITY): Payer: Medicare Other | Admitting: Physical Therapy

## 2022-08-16 ENCOUNTER — Ambulatory Visit (HOSPITAL_COMMUNITY): Payer: Medicare Other | Admitting: Physical Therapy

## 2022-08-20 ENCOUNTER — Telehealth (HOSPITAL_COMMUNITY): Payer: Self-pay | Admitting: Emergency Medicine

## 2022-08-20 ENCOUNTER — Ambulatory Visit (HOSPITAL_COMMUNITY): Payer: Medicare Other | Admitting: Physical Therapy

## 2022-08-20 DIAGNOSIS — R079 Chest pain, unspecified: Secondary | ICD-10-CM

## 2022-08-20 MED ORDER — IVABRADINE HCL 5 MG PO TABS: 15.0000 mg | ORAL_TABLET | Freq: Once | ORAL | 0 refills | Status: AC

## 2022-08-20 MED ORDER — METOPROLOL TARTRATE 100 MG PO TABS: 100.0000 mg | ORAL_TABLET | Freq: Once | ORAL | 0 refills | Status: DC

## 2022-08-20 NOTE — Telephone Encounter (Signed)
Reaching out to patient to offer assistance regarding upcoming cardiac imaging study; pt verbalizes understanding of appt date/time, parking situation and where to check in, pre-test NPO status and medications ordered, and verified current allergies; name and call back number provided for further questions should they arise Marchia Bond RN Navigator Cardiac Imaging [facility] [phone] office [phone] cell  '100mg'$  metoprolol + '15mg'$  ivadradine sent to [facility] Denies iv issues  Arrival 1030 w/c entrance

## 2022-08-22 ENCOUNTER — Ambulatory Visit (HOSPITAL_COMMUNITY)
Admission: RE | Admit: 2022-08-22 | Discharge: 2022-08-22 | Disposition: A | Discharge status: Home / Self Care | Payer: Medicare Other | Source: Ambulatory Visit | Attending: Family Medicine | Admitting: Family Medicine

## 2022-08-22 DIAGNOSIS — I2510 Atherosclerotic heart disease of native coronary artery without angina pectoris: Secondary | ICD-10-CM | POA: Diagnosis not present

## 2022-08-22 DIAGNOSIS — R918 Other nonspecific abnormal finding of lung field: Secondary | ICD-10-CM | POA: Diagnosis not present

## 2022-08-22 DIAGNOSIS — R079 Chest pain, unspecified: Secondary | ICD-10-CM | POA: Insufficient documentation

## 2022-08-22 MED ORDER — NITROGLYCERIN 0.4 MG SL SUBL
SUBLINGUAL_TABLET | SUBLINGUAL | Status: AC
  Filled 2022-08-22: qty 2

## 2022-08-22 MED ORDER — NITROGLYCERIN 0.4 MG SL SUBL
0.8000 mg | SUBLINGUAL_TABLET | Freq: Once | SUBLINGUAL | Status: AC
  Administered 2022-08-22: 0.8 mg via SUBLINGUAL

## 2022-08-22 MED ORDER — IOHEXOL 350 MG/ML SOLN
100.0000 mL | Freq: Once | INTRAVENOUS | Status: AC | PRN
  Administered 2022-08-22: 100 mL via INTRAVENOUS

## 2022-08-23 ENCOUNTER — Ambulatory Visit (HOSPITAL_COMMUNITY): Payer: Medicare Other | Admitting: Physical Therapy

## 2022-09-07 ENCOUNTER — Ambulatory Visit (HOSPITAL_COMMUNITY): Payer: BLUE CROSS/BLUE SHIELD

## 2022-09-07 ENCOUNTER — Other Ambulatory Visit (HOSPITAL_COMMUNITY): Payer: BLUE CROSS/BLUE SHIELD

## 2022-09-17 ENCOUNTER — Ambulatory Visit (HOSPITAL_COMMUNITY)
Admission: RE | Admit: 2022-09-17 | Discharge: 2022-09-17 | Disposition: A | Discharge status: Home / Self Care | Payer: Medicare Other | Source: Ambulatory Visit | Attending: Family Medicine | Admitting: Family Medicine

## 2022-09-17 DIAGNOSIS — Z780 Asymptomatic menopausal state: Secondary | ICD-10-CM | POA: Insufficient documentation

## 2022-09-17 DIAGNOSIS — Z1231 Encounter for screening mammogram for malignant neoplasm of breast: Secondary | ICD-10-CM | POA: Diagnosis not present

## 2022-09-17 DIAGNOSIS — F17200 Nicotine dependence, unspecified, uncomplicated: Secondary | ICD-10-CM | POA: Diagnosis not present

## 2022-09-17 DIAGNOSIS — Z13820 Encounter for screening for osteoporosis: Secondary | ICD-10-CM | POA: Insufficient documentation

## 2022-09-17 DIAGNOSIS — M8580 Other specified disorders of bone density and structure, unspecified site: Secondary | ICD-10-CM | POA: Insufficient documentation

## 2022-09-17 DIAGNOSIS — M8589 Other specified disorders of bone density and structure, multiple sites: Secondary | ICD-10-CM | POA: Insufficient documentation

## 2022-11-21 ENCOUNTER — Institutional Professional Consult (permissible substitution): Payer: Medicare Other | Admitting: Pulmonary Disease

## 2022-12-10 ENCOUNTER — Other Ambulatory Visit (HOSPITAL_COMMUNITY): Payer: Self-pay | Admitting: Family Medicine

## 2022-12-10 DIAGNOSIS — F17200 Nicotine dependence, unspecified, uncomplicated: Secondary | ICD-10-CM

## 2022-12-10 DIAGNOSIS — R911 Solitary pulmonary nodule: Secondary | ICD-10-CM

## 2023-01-03 ENCOUNTER — Ambulatory Visit: Payer: Medicare Other | Admitting: Internal Medicine

## 2023-01-03 ENCOUNTER — Encounter: Payer: Self-pay | Admitting: Internal Medicine

## 2023-01-03 VITALS — BP 136/84 | HR 104 | Ht 65.0 in | Wt 289.5 lb

## 2023-01-03 DIAGNOSIS — F17210 Nicotine dependence, cigarettes, uncomplicated: Secondary | ICD-10-CM | POA: Diagnosis not present

## 2023-01-03 DIAGNOSIS — I10 Essential (primary) hypertension: Secondary | ICD-10-CM

## 2023-01-03 DIAGNOSIS — J449 Chronic obstructive pulmonary disease, unspecified: Secondary | ICD-10-CM | POA: Diagnosis not present

## 2023-01-03 MED ORDER — BREZTRI AEROSPHERE 160-9-4.8 MCG/ACT IN AERO: 2.0000 | INHALATION_SPRAY | Freq: Two times a day (BID) | RESPIRATORY_TRACT | 0 refills | Status: DC

## 2023-01-03 MED ORDER — OLMESARTAN MEDOXOMIL 20 MG PO TABS: 20.0000 mg | ORAL_TABLET | Freq: Every day | ORAL | Status: DC

## 2023-01-03 MED ORDER — BREZTRI AEROSPHERE 160-9-4.8 MCG/ACT IN AERO: 2.0000 | INHALATION_SPRAY | Freq: Two times a day (BID) | RESPIRATORY_TRACT | 11 refills | Status: DC

## 2023-01-03 NOTE — Patient Instructions (Addendum)
Plan A = Automatic = Always=   Breztri Take 2 puffs first thing in am and then another 2 puffs about 12 hours later.    Work on inhaler technique:  relax and gently blow all the way out then take a nice smooth full deep breath back in, triggering the inhaler at same time you start breathing in.  Hold breath in for at least  5 seconds if you can. Blow out breztri  thru nose. Rinse and gargle with water when done.  If mouth or throat bother you at all,  try brushing teeth/gums/tongue with arm and hammer toothpaste/ make a slurry and gargle and spit out.     Plan B = Backup (to supplement plan A, not to replace it) Only use your albuterol inhaler as a rescue medication to be used if you can't catch your breath by resting or doing a relaxed purse lip breathing pattern.  - The less you use it, the better it will work when you need it. - Ok to use the inhaler up to 2 puffs  every 4 hours if you must but call for appointment if use goes up over your usual need - Don't leave home without it !!  (think of it like the starter fluid for your car)   Stop linisopril and take olmesartan 20 mg one daily    Please schedule a follow up office visit in 4 weeks, sooner if needed  with all medications /inhalers/ solutions in hand so we can verify exactly what you are taking. This includes all medications from all doctors and over the counters

## 2023-01-03 NOTE — Progress Notes (Signed)
Christina Castaneda, female    DOB: [birth year]    MRN: [id number]   Brief patient profile:  64   yowf  active smoker since around 2000 seemed better oon just  proair  referred to pulmonary clinic in [place]  01/03/2023 by Dr Farris Has with worsening sob since 2014 esp since 2023 some better on incruse and wixella       History of Present Illness  01/03/2023  Pulmonary/ 1st office eval/ Melvyn Novas / [place] on wixella/incruse /ACEi  Chief Complaint  Patient presents with   Consult    Pt consult for SOB going on >1year. Uses Wixela and ProAir inhaler regularly  Dyspnea:  room to room / riding scooter due to knees  Cough: sometimes throat clearing mostly in am white mucus min vol Sleep: flat bed/  1.5 pillows  SABA use:  p ex  02: none  Lung cancer screen: in progress     No obvious day to day or daytime pattern/variability or assoc excess/ purulent sputum or mucus plugs or hemoptysis or cp or chest tightness, subjective wheeze or overt sinus or hb symptoms.   Sleeping ok  without nocturnal  or early am exacerbation  of respiratory  c/o's or need for noct saba. Also denies any obvious fluctuation of symptoms with weather or environmental changes or other aggravating or alleviating factors except as outlined above   No unusual exposure hx or h/o childhood pna/ asthma or knowledge of premature birth.  Current Allergies, Complete Past Medical History, Past Surgical History, Family History, and Social History were reviewed in [employer] record.  ROS  The following are not active complaints unless bolded Hoarseness, sore throat, dysphagia, dental problems, itching, sneezing,  nasal congestion or discharge of excess mucus or purulent secretions, ear ache,   fever, chills, sweats, unintended wt loss or wt gain, classically pleuritic or exertional cp,  orthopnea pnd or arm/hand swelling  or leg swelling, presyncope, palpitations, abdominal pain, anorexia, nausea,  vomiting, diarrhea  or change in bowel habits or change in bladder habits, change in stools or change in urine, dysuria, hematuria,  rash, arthralgias, visual complaints, headache, numbness, weakness or ataxia or problems with walking or coordination,  change in mood or  memory.           Past Medical History:  Diagnosis Date   Asthma    mild   Atypical endometrial hyperplasia    COPD (chronic obstructive pulmonary disease) (HCC)    Goiter    Gout    Heart murmur    Hypertension    Hypothyroidism    Vitamin B12 deficiency    Yeast infection    for last 3 weeks    Outpatient Medications Prior to Visit  Medication Sig Dispense Refill   acetaminophen (TYLENOL) 650 MG CR tablet Take 650 mg by mouth every 8 (eight) hours as needed for pain.     allopurinol (ZYLOPRIM) 100 MG tablet Take 1 tablet (100 mg total) by mouth daily. 30 tablet 4   ergocalciferol (VITAMIN D2) 50000 UNITS capsule Take 50,000 Units by mouth once a week.     gabapentin (NEURONTIN) 100 MG capsule Take by mouth.     gabapentin (NEURONTIN) 300 MG capsule Take 1 capsule by mouth at bedtime.     levothyroxine (SYNTHROID) 150 MCG tablet Take 1 tablet by mouth daily.     lisinopril (PRINIVIL,ZESTRIL) 20 MG tablet Take 20 mg by mouth daily.  2   senna-docusate (SENOKOT-S) 8.6-50 MG  tablet Take 2 tablets by mouth 3 (three) times daily before meals. 100 tablet 0   WIXELA INHUB 500-50 MCG/DOSE AEPB Inhale 1 puff into the lungs 2 (two) times daily.     albuterol (PROVENTIL HFA;VENTOLIN HFA) 108 (90 BASE) MCG/ACT inhaler Inhale 2 puffs into the lungs every 6 (six) hours as needed. Wheezing and shortness of breath     loratadine (CLARITIN) 10 MG tablet Take 10 mg by mouth daily. (Patient not taking: Reported on 01/03/2023)     metoprolol tartrate (LOPRESSOR) 100 MG tablet Take 1 tablet (100 mg total) by mouth once for 1 dose. Please take one time dose '100mg'$  metoprolol tartrate 2 hr prior to cardiac CT for HR control IF HR >55bpm. 1  tablet 0   Multiple Vitamin (MULTIVITAMIN) tablet Take 1 tablet by mouth daily. (Patient not taking: Reported on 04/14/2021)     pantoprazole (PROTONIX) 40 MG tablet TAKE 1 TABLET (40 MG TOTAL) BY MOUTH DAILY. (Patient not taking: No sig reported) 30 tablet 0   traZODone (DESYREL) 50 MG tablet TAKE 1 TABLET (50 MG TOTAL) BY MOUTH AT BEDTIME AS NEEDED FOR SLEEP. (Patient not taking: No sig reported) 30 tablet 0   ADVAIR DISKUS 250-50 MCG/DOSE AEPB Inhale 1 puff into the lungs 2 (two) times daily. Once in the am & once at pm (Patient not taking: Reported on 04/14/2021)     ferrous sulfate 325 (65 FE) MG tablet TAKE 1 TABLET (325 MG TOTAL) BY MOUTH 2 (TWO) TIMES DAILY WITH A MEAL. (Patient not taking: No sig reported) 60 tablet 0   furosemide (LASIX) 20 MG tablet Take 20 mg by mouth daily. (Patient not taking: Reported on 04/14/2021)  3   hydrochlorothiazide (HYDRODIURIL) 25 MG tablet Take 1 tablet (25 mg total) by mouth daily. (Patient not taking: Reported on 04/14/2021) 30 tablet 2   levothyroxine (SYNTHROID, LEVOTHROID) 200 MCG tablet Take 200 mcg by mouth daily before breakfast.  (Patient not taking: Reported on 04/14/2021)     No facility-administered medications prior to visit.     Objective:     BP 136/84   Pulse (!) 104   Ht '5\' 5"'$  (1.651 m)   Wt 289 lb 8 oz (131.3 kg)   LMP 11/18/2012   SpO2 95%   BMI 48.18 kg/m   SpO2: 95 %  around 300 lb at onset  HEENT : Oropharynx clear/ edentulous      NECK :  without  apparent JVD/ palpable Nodes/TM    LUNGS: no acc muscle use,  Mild barrel  contour chest wall with bilateral  Distant bs s audible wheeze and  without cough on insp or exp maneuvers  and mild  Hyperresonant  to  percussion bilaterally     CV:  RRR  no s3 or murmur or increase in P2, and no edema   ABD: quite obese  soft and nontender with pos end  insp Hoover's  in the supine position.  No bruits or organomegaly appreciated   MS:  Nl gait/ ext warm without deformities Or  obvious joint restrictions  calf tenderness, cyanosis or clubbing     SKIN: warm and dry without lesions    NEURO:  alert, approp, nl sensorium with  no motor or cerebellar deficits apparent.        I personally reviewed images and agree with radiology impression as follows:   Chest CT coronary study    08/22/22 Multiple small bilateral pulmonary nodules measuring up to 6 mm, unchanged from  prior study. Six-month follow-up (in December 2023) was recommended    Assessment   COPD  GOLD ? AB Active smoker - Labs ordered 01/03/2023  :  allergy profile   alpha one AT phenotype  - 01/03/2023  After extensive coaching inhaler device,  effectiveness =    75% (short Ti) > try breztri Take 2 puffs first thing in am and then another 2 puffs about 12 hours later.   DDX of  difficult airways management almost all start with A and  include Adherence, Ace Inhibitors, Acid Reflux, Active Sinus Disease, Alpha 1 Antitripsin deficiency, Anxiety masquerading as Airways dz,  ABPA,  Allergy(esp in young), Aspiration (esp in elderly), Adverse effects of meds,  Active smoking or vaping, A bunch of PE's (a small clot burden can't cause this syndrome unless there is already severe underlying pulm or vascular dz with poor reserve) plus two Bs  = Bronchiectasis and Beta blocker use..and one C= CHF   Adherence is always the initial "prime suspect" and is a multilayered concern that requires a "trust but verify" approach in every patient - starting with knowing how to use medications, especially inhalers, correctly, keeping up with refills and understanding the fundamental difference between maintenance and prns vs those medications only taken for a very short course and then stopped and not refilled.  - see hfa teaching - return with all meds in hand using a trust but verify approach to confirm accurate Medication  Reconciliation The principal here is that until we are certain that the  patients are doing what we've  asked, it makes no sense to ask them to do more.   Active smoker (see separate a/p)   ACEi adverse effects at the  top of the usual list of suspects and the only way to rule it out is a trial off > see a/p    ?allergy/asthma > try change to breztri x one week and continue improving/ check allergy screen  Re SABA :  I spent extra time with pt today reviewing appropriate use of albuterol for prn use on exertion with the following points: 1) saba is for relief of sob that does not improve by walking a slower pace or resting but rather if the pt does not improve after trying this first. 2) If the pt is convinced, as many are, that saba helps recover from activity faster then it's easy to tell if this is the case by re-challenging : ie stop, take the inhaler, then p 5 minutes try the exact same activity (intensity of workload) that just caused the symptoms and see if they are substantially diminished or not after saba 3) if there is an activity that reproducibly causes the symptoms, try the saba 15 min before the activity on alternate days   If in fact the saba really does help, then fine to continue to use it prn but advised may need to look closer at the maintenance regimen being used to achieve better control of airways disease with exertion.   ? Adverse effects of dpi > try on hfa   ? Alpha one at def > check phenotype     Essential hypertension Try off acei 01/03/2023 due to sob/cough   ACE inhibitors are problematic in  pts with airway complaints because  even experienced pulmonologists can't always distinguish ace effects from copd/asthma.  By themselves they don't actually cause a problem, much like oxygen can't by itself start a fire, but they certainly serve as a powerful catalyst  or enhancer for any "fire"  or inflammatory process in the upper airway, be it caused by an ET  tube or more commonly reflux (especially in the obese or pts with known GERD or who are on biphoshonates).    In  the era of ARB near equivalency until we have a better handle on the reversibility of the airway problem, it just makes sense to avoid ACEI  entirely in the short run and then decide later, having established a level of airway control using a reasonable limited regimen, whether to add back ace but even then being very careful to observe the pt for worsening airway control and number of meds used/ needed to control symptoms.    >>>> try benicar 20 mg daily x 6 weeks then regroup  Cigarette smoker Counseled re importance of smoking cessation but did not meet time criteria for separate billing     - already in Lung cancer screening program for which she is eligible until age 24         Each maintenance medication was reviewed in detail including emphasizing most importantly the difference between maintenance and prns and under what circumstances the prns are to be triggered using an action plan format where appropriate.  Total time for H and P, chart review, counseling, reviewing hfa device(s) and generating customized AVS unique to this office visit / same day charting > 45 min with pt new to me          Christinia Gully, MD 01/03/2023

## 2023-01-04 ENCOUNTER — Encounter: Payer: Self-pay | Admitting: Internal Medicine

## 2023-01-04 DIAGNOSIS — F17210 Nicotine dependence, cigarettes, uncomplicated: Secondary | ICD-10-CM | POA: Insufficient documentation

## 2023-01-04 NOTE — Assessment & Plan Note (Signed)
Counseled re importance of smoking cessation but did not meet time criteria for separate billing     - already in Lung cancer screening program for which she is eligible until age 67         Each maintenance medication was reviewed in detail including emphasizing most importantly the difference between maintenance and prns and under what circumstances the prns are to be triggered using an action plan format where appropriate.  Total time for H and P, chart review, counseling, reviewing hfa device(s) and generating customized AVS unique to this office visit / same day charting > 45 min with pt new to me

## 2023-01-04 NOTE — Assessment & Plan Note (Signed)
Active smoker - Labs ordered 01/03/2023  :  allergy profile   alpha one AT phenotype  - 01/03/2023  After extensive coaching inhaler device,  effectiveness =    75% (short Ti) > try breztri Take 2 puffs first thing in am and then another 2 puffs about 12 hours later.   DDX of  difficult airways management almost all start with A and  include Adherence, Ace Inhibitors, Acid Reflux, Active Sinus Disease, Alpha 1 Antitripsin deficiency, Anxiety masquerading as Airways dz,  ABPA,  Allergy(esp in young), Aspiration (esp in elderly), Adverse effects of meds,  Active smoking or vaping, A bunch of PE's (a small clot burden can't cause this syndrome unless there is already severe underlying pulm or vascular dz with poor reserve) plus two Bs  = Bronchiectasis and Beta blocker use..and one C= CHF   Adherence is always the initial "prime suspect" and is a multilayered concern that requires a "trust but verify" approach in every patient - starting with knowing how to use medications, especially inhalers, correctly, keeping up with refills and understanding the fundamental difference between maintenance and prns vs those medications only taken for a very short course and then stopped and not refilled.  - see hfa teaching - return with all meds in hand using a trust but verify approach to confirm accurate Medication  Reconciliation The principal here is that until we are certain that the  patients are doing what we've asked, it makes no sense to ask them to do more.   Active smoker (see separate a/p)   ACEi adverse effects at the  top of the usual list of suspects and the only way to rule it out is a trial off > see a/p    ?allergy/asthma > try change to breztri x one week and continue improving/ check allergy screen  Re SABA :  I spent extra time with pt today reviewing appropriate use of albuterol for prn use on exertion with the following points: 1) saba is for relief of sob that does not improve by walking a  slower pace or resting but rather if the pt does not improve after trying this first. 2) If the pt is convinced, as many are, that saba helps recover from activity faster then it's easy to tell if this is the case by re-challenging : ie stop, take the inhaler, then p 5 minutes try the exact same activity (intensity of workload) that just caused the symptoms and see if they are substantially diminished or not after saba 3) if there is an activity that reproducibly causes the symptoms, try the saba 15 min before the activity on alternate days   If in fact the saba really does help, then fine to continue to use it prn but advised may need to look closer at the maintenance regimen being used to achieve better control of airways disease with exertion.   ? Adverse effects of dpi > try on hfa   ? Alpha one at def > check phenotype

## 2023-01-04 NOTE — Assessment & Plan Note (Signed)
Try off acei 01/03/2023 due to sob/cough   ACE inhibitors are problematic in  pts with airway complaints because  even experienced pulmonologists can't always distinguish ace effects from copd/asthma.  By themselves they don't actually cause a problem, much like oxygen can't by itself start a fire, but they certainly serve as a powerful catalyst or enhancer for any "fire"  or inflammatory process in the upper airway, be it caused by an ET  tube or more commonly reflux (especially in the obese or pts with known GERD or who are on biphoshonates).    In the era of ARB near equivalency until we have a better handle on the reversibility of the airway problem, it just makes sense to avoid ACEI  entirely in the short run and then decide later, having established a level of airway control using a reasonable limited regimen, whether to add back ace but even then being very careful to observe the pt for worsening airway control and number of meds used/ needed to control symptoms.    >>>> try benicar 20 mg daily x 6 weeks then regroup

## 2023-01-07 ENCOUNTER — Ambulatory Visit (HOSPITAL_COMMUNITY)
Admission: RE | Admit: 2023-01-07 | Discharge: 2023-01-07 | Disposition: A | Discharge status: Home / Self Care | Payer: Medicare Other | Source: Ambulatory Visit | Attending: Family Medicine | Admitting: Family Medicine

## 2023-01-07 DIAGNOSIS — R911 Solitary pulmonary nodule: Secondary | ICD-10-CM | POA: Insufficient documentation

## 2023-01-07 DIAGNOSIS — F17200 Nicotine dependence, unspecified, uncomplicated: Secondary | ICD-10-CM | POA: Insufficient documentation

## 2023-01-07 LAB — CBC WITH DIFFERENTIAL/PLATELET
Basophils Absolute: 0 10*3/uL (ref 0.0–0.2)
Basos: 0 %
EOS (ABSOLUTE): 0.3 10*3/uL (ref 0.0–0.4)
Eos: 2 %
Hematocrit: 42.3 % (ref 34.0–46.6)
Hemoglobin: 14.4 g/dL (ref 11.1–15.9)
Immature Grans (Abs): 0.1 10*3/uL (ref 0.0–0.1)
Immature Granulocytes: 1 %
Lymphocytes Absolute: 1.5 10*3/uL (ref 0.7–3.1)
Lymphs: 14 %
MCH: 33.5 pg — ABNORMAL HIGH (ref 26.6–33.0)
MCHC: 34 g/dL (ref 31.5–35.7)
MCV: 98 fL — ABNORMAL HIGH (ref 79–97)
Monocytes Absolute: 0.7 10*3/uL (ref 0.1–0.9)
Monocytes: 6 %
Neutrophils Absolute: 8.2 10*3/uL — ABNORMAL HIGH (ref 1.4–7.0)
Neutrophils: 77 %
Platelets: 253 10*3/uL (ref 150–450)
RBC: 4.3 x10E6/uL (ref 3.77–5.28)
RDW: 13.2 % (ref 11.7–15.4)
WBC: 10.7 10*3/uL (ref 3.4–10.8)

## 2023-01-07 LAB — ALPHA-1-ANTITRYPSIN PHENOTYP: A-1 Antitrypsin: 195 mg/dL — ABNORMAL HIGH (ref 101–187)

## 2023-02-07 ENCOUNTER — Ambulatory Visit: Payer: Medicare Other | Admitting: Internal Medicine

## 2023-02-07 ENCOUNTER — Encounter: Payer: Self-pay | Admitting: Internal Medicine

## 2023-02-07 VITALS — BP 134/84 | HR 84 | Ht 65.0 in | Wt 277.4 lb

## 2023-02-07 DIAGNOSIS — J449 Chronic obstructive pulmonary disease, unspecified: Secondary | ICD-10-CM

## 2023-02-07 DIAGNOSIS — F17210 Nicotine dependence, cigarettes, uncomplicated: Secondary | ICD-10-CM

## 2023-02-07 DIAGNOSIS — I10 Essential (primary) hypertension: Secondary | ICD-10-CM

## 2023-02-07 NOTE — Assessment & Plan Note (Addendum)
Counseled re importance of smoking cessation but did not meet time criteria for separate billing    F/u  q6 m, sooner if needed  Each maintenance medication was reviewed in detail including emphasizing most importantly the difference between maintenance and prns and under what circumstances the prns are to be triggered using an action plan format where appropriate.  Total time for H and P, chart review, counseling, reviewing hfa  device(s) and generating customized AVS unique to this office visit / same day charting = 31 min

## 2023-02-07 NOTE — Assessment & Plan Note (Signed)
Try off acei 01/03/2023 due to sob/cough > improved 02/07/2023   Has not started ACEi and self monitoring bp - rec she discuss goals and longterm rx with PCP but avoid ACEi here:  Comment  Although even in retrospect it may not be clear the ACEi contributed to the pt's symptoms,  Pt improved off them and adding them back at this point or in the future would risk confusion in interpretation of non-specific respiratory symptoms to which this patient is prone  ie  Better not to muddy the waters here.

## 2023-02-07 NOTE — Assessment & Plan Note (Signed)
Active smoker/MM - Labs ordered 01/03/2023  :  Eos 0.3  alpha one AT phenotype  MM  level 195 - 01/03/2023  try breztri Take 2 puffs first thing in am and then another 2 puffs about 12 hours later.  - CT chest 01/06/23 Pos centrilobular emphysema - 02/07/2023  After extensive coaching inhaler device,  effectiveness =    80% (late trigger)   Group D (now reclassified as E) in terms of symptom/risk and laba/lama/ICS  therefore appropriate rx at this point >>>  breztri and now using saba more approp   Needs pfts next

## 2023-02-07 NOTE — Patient Instructions (Addendum)
The key is to stop smoking completely before smoking completely stops you!  Work on inhaler technique:  relax and gently blow all the way out then take a nice smooth full deep breath back in, triggering the inhaler at same time you start breathing in.  Hold breath in for at least  5 seconds if you can. Blow out [employer] thru nose. Rinse and gargle with water when done.  If mouth or throat bother you at all,  try brushing teeth/gums/tongue with arm and hammer toothpaste/ make a slurry and gargle and spit out.   PFTs next available  Please schedule a follow up visit in 6  months but call sooner if needed

## 2023-02-07 NOTE — Progress Notes (Addendum)
Christina Castaneda, female    DOB: 08-Apr-1956    MRN: [id number]   Brief patient profile:  59 yowf active smoker/MM since around 2000 seemed better  on just  proair  referred to pulmonary clinic in [place]  01/03/2023 by Dr Christina Castaneda with worsening sob since 2014 esp since 2023 some better on incruse and wixella     History of Present Illness  01/03/2023  Pulmonary/ 1st office eval/ Christina Castaneda / [place] on wixella/incruse /ACEi  Chief Complaint  Patient presents with   Consult    Pt consult for SOB going on >1year. Uses Wixela and ProAir inhaler regularly  Dyspnea:  room to room / riding scooter due to knees  Cough: sometimes throat clearing mostly in am white mucus min vol Sleep: flat bed/  1.5 pillows  SABA use:  p ex  02: none  Lung cancer screen: in progress  Rec Plan A = Automatic = Always=   Breztri Take 2 puffs first thing in am and then another 2 puffs about 12 hours later.   Work on Orthoptist B = Backup (to supplement plan A, not to replace it) Only use your albuterol inhaler as a rescue medication Stop linisopril and take olmesartan 20 mg one daily  Please schedule a follow up office visit in 4 weeks, sooner if needed  with all medications /inhalers/ solutions in hand    02/07/2023  f/u ov/Christina Castaneda office/Christina Castaneda re: copd gold ?  maint on breztri 2bid   Chief Complaint  Patient presents with   Follow-up    Better since last ov    Dyspnea:  limited by knees > breathing  Cough: min am mucus  Sleeping: flat bed/ 2 pillow and cpap per Christina Castaneda  SABA use: much less  - max once or twice a daybut usually finding she doesn't really need it f paces or rests when over does it/ rarely using pre exertion as rec as not convinced it helps   02: none Covid status: needs updating Lung cancer screening:    No obvious day to day or daytime variability or assoc excess/ purulent sputum or mucus plugs or hemoptysis or cp or chest tightness, subjective wheeze or  overt sinus or hb symptoms.   Sleeping now  without nocturnal  or early am exacerbation  of respiratory  c/o's or need for noct saba. Also denies any obvious fluctuation of symptoms with weather or environmental changes or other aggravating or alleviating factors except as outlined above   No unusual exposure hx or h/o childhood pna/ asthma or knowledge of premature birth.  Current Allergies, Complete Past Medical History, Past Surgical History, Family History, and Social History were reviewed in [employer] [facility] record.  ROS  The following are not active complaints unless bolded Hoarseness, sore throat, dysphagia, dental problems, itching, sneezing,  nasal congestion or discharge of excess mucus or purulent secretions, ear ache,   fever, chills, sweats, unintended wt loss or wt gain, classically pleuritic or exertional cp,  orthopnea pnd or arm/hand swelling  or leg swelling, presyncope, palpitations, abdominal pain, anorexia, nausea, vomiting, diarrhea  or change in bowel habits or change in bladder habits, change in stools or change in urine, dysuria, hematuria,  rash, arthralgias, visual complaints, headache, numbness, weakness or ataxia or problems with walking or coordination,  change in mood or  memory.        Current Meds  Medication Sig   acetaminophen (TYLENOL) 650 MG CR tablet Take 650 mg  by mouth every 8 (eight) hours as needed for pain.   albuterol (PROVENTIL HFA;VENTOLIN HFA) 108 (90 BASE) MCG/ACT inhaler Inhale 2 puffs into the lungs every 6 (six) hours as needed. Wheezing and shortness of breath   allopurinol (ZYLOPRIM) 100 MG tablet Take 1 tablet (100 mg total) by mouth daily.   Budeson-Glycopyrrol-Formoterol (BREZTRI AEROSPHERE) 160-9-4.8 MCG/ACT AERO Inhale 2 puffs into the lungs 2 (two) times daily.   Budeson-Glycopyrrol-Formoterol (BREZTRI AEROSPHERE) 160-9-4.8 MCG/ACT AERO Inhale 2 puffs into the lungs in the morning and at bedtime.   ergocalciferol (VITAMIN  D2) 50000 UNITS capsule Take 50,000 Units by mouth once a week.   gabapentin (NEURONTIN) 100 MG capsule Take by mouth.   gabapentin (NEURONTIN) 300 MG capsule Take 1 capsule by mouth at bedtime.   levothyroxine (SYNTHROID) 150 MCG tablet Take 1 tablet by mouth daily.   loratadine (CLARITIN) 10 MG tablet Take 10 mg by mouth daily.   senna-docusate (SENOKOT-S) 8.6-50 MG tablet Take 2 tablets by mouth 3 (three) times daily before meals.   Current Facility-Administered Medications for the 02/07/23 encounter (Office Visit) with Christina Rockers, MD  Medication   olmesartan (BENICAR) tablet 20 mg              Past Medical History:  Diagnosis Date   Asthma    mild   Atypical endometrial hyperplasia    COPD (chronic obstructive pulmonary disease) ([place])    Goiter    Gout    Heart murmur    Hypertension    Hypothyroidism    Vitamin B12 deficiency    Yeast infection    for last 3 weeks        Objective:     Wt Readings from Last 3 Encounters:  02/07/23 277 lb 6.4 oz (125.8 kg)  01/03/23 289 lb 8 oz (131.3 kg)  04/14/21 290 lb (131.5 kg)      Vital signs reviewed  02/07/2023  - Note at rest 02 sats  94% on RA   General appearance:    MO (by BMI) amb wf nad            HEENT : Oropharynx  clear/ edentulous      NECK :  without  apparent JVD/ palpable Nodes/TM    LUNGS: no acc muscle use,  Mild barrel  contour chest wall with bilateral  Distant bs s audible wheeze and  without cough on insp or exp maneuvers  and mild  Hyperresonant  to  percussion bilaterally     CV:  RRR  no s3 or murmur or increase in P2, and trace pitting both LE edema   ABD:  quite obese but soft and nontender   MS:  Nl gait/ ext warm without deformities Or obvious joint restrictions  calf tenderness, cyanosis or clubbing     SKIN: warm and dry without lesions    NEURO:  alert, approp, nl sensorium with  no motor or cerebellar deficits apparent.    I personally reviewed images and agree with  radiology impression as follows:   Chest LDSCT     01/07/23 1. Lung-RADS 2, benign appearance or behavior. Continue annual screening with low-dose chest CT without contrast in 12 months. 2. Aortic Atherosclerosis (ICD10-I70.0) and Emphysema (ICD10-J43.9).      Assessment

## 2023-02-13 ENCOUNTER — Telehealth: Payer: Self-pay | Admitting: *Deleted

## 2023-02-13 MED ORDER — BREZTRI AEROSPHERE 160-9-4.8 MCG/ACT IN AERO: 2.0000 | INHALATION_SPRAY | Freq: Two times a day (BID) | RESPIRATORY_TRACT | 11 refills | Status: DC

## 2023-02-13 NOTE — Telephone Encounter (Signed)
Patient is looking for prescription of Breztri. Patient uses [name]. Please call and advise patient

## 2023-02-13 NOTE — Telephone Encounter (Signed)
Breztri Rx sent to [name]

## 2023-05-07 ENCOUNTER — Ambulatory Visit (HOSPITAL_COMMUNITY)
Admission: RE | Admit: 2023-05-07 | Discharge: 2023-05-07 | Disposition: A | Discharge status: Home / Self Care | Payer: Medicare Other | Source: Ambulatory Visit | Attending: Internal Medicine | Admitting: Internal Medicine

## 2023-05-28 ENCOUNTER — Ambulatory Visit (HOSPITAL_COMMUNITY)
Admission: RE | Admit: 2023-05-28 | Discharge: 2023-05-28 | Disposition: A | Discharge status: Home / Self Care | Payer: Medicare Other | Source: Ambulatory Visit | Attending: Internal Medicine | Admitting: Internal Medicine

## 2023-05-28 DIAGNOSIS — J449 Chronic obstructive pulmonary disease, unspecified: Secondary | ICD-10-CM

## 2023-05-28 LAB — PULMONARY FUNCTION TEST
DL/VA % pred: 100 %
DL/VA: 4.14 ml/min/mmHg/L
DLCO unc % pred: 71 %
DLCO unc: 14.19 ml/min/mmHg
FEF 25-75 Post: 0.86 L/sec
FEF 25-75 Pre: 0.6 L/sec
FEF2575-%Change-Post: 43 %
FEF2575-%Pred-Post: 43 %
FEF2575-%Pred-Pre: 30 %
FEV1-%Change-Post: 8 %
FEV1-%Pred-Post: 58 %
FEV1-%Pred-Pre: 54 %
FEV1-Post: 1.41 L
FEV1-Pre: 1.3 L
FEV1FVC-%Change-Post: 3 %
FEV1FVC-%Pred-Pre: 81 %
FEV6-%Change-Post: 7 %
FEV6-%Pred-Post: 70 %
FEV6-%Pred-Pre: 65 %
FEV6-Post: 2.12 L
FEV6-Pre: 1.97 L
FEV6FVC-%Change-Post: 2 %
FEV6FVC-%Pred-Post: 100 %
FEV6FVC-%Pred-Pre: 98 %
FVC-%Change-Post: 4 %
FVC-%Pred-Post: 69 %
FVC-%Pred-Pre: 66 %
FVC-Post: 2.2 L
FVC-Pre: 2.1 L
Post FEV1/FVC ratio: 64 %
Post FEV6/FVC ratio: 97 %
Pre FEV1/FVC ratio: 62 %
Pre FEV6/FVC Ratio: 94 %

## 2023-05-28 MED ORDER — ALBUTEROL SULFATE (2.5 MG/3ML) 0.083% IN NEBU
2.5000 mg | INHALATION_SOLUTION | Freq: Once | RESPIRATORY_TRACT | Status: AC
  Administered 2023-05-28: 2.5 mg via RESPIRATORY_TRACT

## 2023-07-30 ENCOUNTER — Encounter (HOSPITAL_COMMUNITY): Payer: Medicare Other

## 2023-08-13 ENCOUNTER — Telehealth: Payer: Self-pay | Admitting: Internal Medicine

## 2023-08-13 NOTE — Telephone Encounter (Signed)
Patient requesting order/ script for CPAP supplies sent to [facility]. Patient was due for 6 month follow up, appointment made for 09/02/2023 with Dr. Sherene Sires. Please advise.

## 2023-08-19 NOTE — Telephone Encounter (Signed)
Spoke with patient. She has enough supplies to last her to appointment. The DME will need notes that are within 6 months and she is past that. If she cannot wait she knows she needs to call for sooner appointment.

## 2023-09-01 NOTE — Progress Notes (Unsigned)
Christina Castaneda, female    DOB: August 11, 1956    MRN: [id number]   Brief patient profile:  33 yowf active smoker/MM since around 2000 seemed better  on just  proair  referred to pulmonary clinic in [place]  01/03/2023 by Dr Lajuan Lines with worsening sob since 2014 esp since 2023 some better on incruse and wixella and proved to have GOLD 2 copd critera 05/2023    History of Present Illness  01/03/2023  Pulmonary/ 1st office eval/ Sherene Sires / [facility] on wixella/incruse /ACEi  Chief Complaint  Patient presents with   Consult    Pt consult for SOB going on >1year. Uses Wixela and ProAir inhaler regularly  Dyspnea:  room to room / riding scooter due to knees  Cough: sometimes throat clearing mostly in am white mucus min vol Sleep: flat bed/  1.5 pillows  SABA use:  p ex  02: none  Lung cancer screen: in progress  Rec Plan A = Automatic = Always=   Breztri Take 2 puffs first thing in am and then another 2 puffs about 12 hours later.   Work on Printmaker B = Backup (to supplement plan A, not to replace it) Only use your albuterol inhaler as a rescue medication Stop linisopril and take olmesartan 20 mg one daily  Please schedule a follow up office visit in 4 weeks, sooner if needed  with all medications /inhalers/ solutions in hand    02/07/2023  f/u ov/[place] office/Caydan Mctavish re: copd gold ?  maint on breztri 2bid   Chief Complaint  Patient presents with   Follow-up    Better since last ov    Dyspnea:  limited by knees > breathing  Cough: min am mucus  Sleeping: flat bed/ 2 pillow and cpap per [place]  SABA use: much less  - max once or twice a daybut usually finding she doesn't really need it f paces or rests when over does it/ rarely using pre exertion as rec as not convinced it helps   02: none Covid status: needs updating Lung cancer screening:  Rec The key is to stop smoking completely before smoking completely stops you! Work on inhaler technique:   PFTs  next available  Please schedule a follow up visit in 6  months but call sooner if needed    09/02/2023  f/u ov/[place] office/Jennamarie Goings re: GOLD 2 COPD  maint on breztri   Chief Complaint  Patient presents with   Follow-up  Dyspnea:  room to room at home/ w/c or scooter to go out  Cough: minimal am mucoid Sleeping: flat bed/ 2 pillows and cpap  s  resp cc  SABA use: rarely  02: none   Lung cancer screening: q January    No obvious day to day or daytime variability or assoc excess/ purulent sputum or mucus plugs or hemoptysis or cp or chest tightness, subjective wheeze or overt sinus or hb symptoms.    Also denies any obvious fluctuation of symptoms with weather or environmental changes or other aggravating or alleviating factors except as outlined above   No unusual exposure hx or h/o childhood pna/ asthma or knowledge of premature birth.  Current Allergies, Complete Past Medical History, Past Surgical History, Family History, and Social History were reviewed in [employer] record.  ROS  The following are not active complaints unless bolded Hoarseness, sore throat, dysphagia, dental problems, itching, sneezing,  nasal congestion or discharge of excess mucus or purulent secretions, ear ache,  fever, chills, sweats, unintended wt loss or wt gain, classically pleuritic or exertional cp,  orthopnea pnd or arm/hand swelling  or leg swelling, presyncope, palpitations, abdominal pain, anorexia, nausea, vomiting, diarrhea  or change in bowel habits or change in bladder habits, change in stools or change in urine, dysuria, hematuria,  rash, arthralgias, visual complaints, headache, numbness, weakness or ataxia or problems with walking or coordination,  change in mood or  memory.        Current Meds  Medication Sig   acetaminophen (TYLENOL) 650 MG CR tablet Take 650 mg by mouth every 8 (eight) hours as needed for pain.   albuterol (PROVENTIL HFA;VENTOLIN HFA) 108 (90 BASE)  MCG/ACT inhaler Inhale 2 puffs into the lungs every 6 (six) hours as needed. Wheezing and shortness of breath   allopurinol (ZYLOPRIM) 100 MG tablet Take 1 tablet (100 mg total) by mouth daily.   Budeson-Glycopyrrol-Formoterol (BREZTRI AEROSPHERE) 160-9-4.8 MCG/ACT AERO Inhale 2 puffs into the lungs in the morning and at bedtime.   Budeson-Glycopyrrol-Formoterol (BREZTRI AEROSPHERE) 160-9-4.8 MCG/ACT AERO Inhale 2 puffs into the lungs 2 (two) times daily.   ergocalciferol (VITAMIN D2) 50000 UNITS capsule Take 50,000 Units by mouth once a week.   gabapentin (NEURONTIN) 100 MG capsule Take by mouth.   gabapentin (NEURONTIN) 300 MG capsule Take 1 capsule by mouth at bedtime.   levothyroxine (SYNTHROID) 150 MCG tablet Take 1 tablet by mouth daily.   loratadine (CLARITIN) 10 MG tablet Take 10 mg by mouth daily.   senna-docusate (SENOKOT-S) 8.6-50 MG tablet Take 2 tablets by mouth 3 (three) times daily before meals.   Current Facility-Administered Medications for the 09/02/23 encounter (Office Visit) with Nyoka Cowden, MD  Medication   olmesartan (BENICAR) tablet 20 mg                Past Medical History:  Diagnosis Date   Asthma    mild   Atypical endometrial hyperplasia    COPD (chronic obstructive pulmonary disease) (HCC)    Goiter    Gout    Heart murmur    Hypertension    Hypothyroidism    Vitamin B12 deficiency    Yeast infection    for last 3 weeks        Objective:     09/02/2023         265   02/07/23 277 lb 6.4 oz (125.8 kg)  01/03/23 289 lb 8 oz (131.3 kg)  04/14/21 290 lb (131.5 kg)    Vital signs reviewed  09/02/2023  - Note at rest 02 sats  94% on RA   General appearance:    w/c bound elderly wf smoker's rattle     HEENT : Oropharynx  clear      NECK :  without  apparent JVD/ palpable Nodes/TM    LUNGS: no acc muscle use,  Mild barrel  contour chest wall with bilateral insp/ exp rhonchi and without cough on insp or exp maneuvers  and mild  Hyperresonant   to  percussion bilaterally     CV:  RRR  no s3 or murmur or increase in P2, and no edema   ABD:  Obese, soft and non-tender   MS:   ext warm without deformities Or obvious joint restrictions  calf tenderness, cyanosis or clubbing     SKIN: warm and dry without lesions    NEURO:  alert, approp, nl sensorium with  no motor or cerebellar deficits apparent.       Assessment

## 2023-09-02 ENCOUNTER — Encounter: Payer: Self-pay | Admitting: Internal Medicine

## 2023-09-02 ENCOUNTER — Ambulatory Visit: Payer: Medicare Other | Admitting: Internal Medicine

## 2023-09-02 VITALS — BP 120/69 | HR 66 | Ht 65.0 in | Wt 265.0 lb

## 2023-09-02 DIAGNOSIS — J449 Chronic obstructive pulmonary disease, unspecified: Secondary | ICD-10-CM | POA: Diagnosis not present

## 2023-09-02 DIAGNOSIS — F17210 Nicotine dependence, cigarettes, uncomplicated: Secondary | ICD-10-CM | POA: Diagnosis not present

## 2023-09-02 NOTE — Assessment & Plan Note (Signed)
4-5 min discussion re active cigarette smoking in addition to office E&M  Ask about tobacco use:   ongoing as is husband Advise quitting   I took an extended  opportunity with this patient to outline the consequences of continued cigarette use  in airway disorders based on all the data we have from the multiple national lung health studies (perfomed over decades at millions of dollars in cost)  indicating that smoking cessation, not choice of inhalers or pulmonary physicians, is the most important aspect of her  care.   Assess willingness:  Not committed at this point Assist in quit attempt:  Per PCP when ready Arrange follow up:   Follow up per Primary Care planned  For smoking cessation classes call [phone]

## 2023-09-02 NOTE — Patient Instructions (Addendum)
We will re-order your cpap supplies and set you up to follow up here with one of our NP's who does sleep medicine to re-evaluate you within next 6 months   For cough/ congestion > mucinex or mucinex dm  up to maximum of  1200 mg every 12 hours as needed   Please schedule a follow up visit in 12  months but call sooner if needed

## 2023-09-02 NOTE — Assessment & Plan Note (Signed)
Active smoker/MM -  01/03/2023  :  Eos 0.3  alpha one AT phenotype  MM  level 195 - 01/03/2023  try breztri Take 2 puffs first thing in am and then another 2 puffs about 12 hours later.  - CT chest 01/06/23 Pos centrilobular emphysema - 02/07/2023  After extensive coaching inhaler device,  effectiveness =    80% (late trigger) - PFT's  05/28/23  FEV1 1.41 (58 % ) ratio 0.64  p 8 % improvement from saba p Breztri prior to study with DLCO  14.19 (71%)   and FV curve mildly concave and ERV 45 at wt 260   - 09/02/2023  After extensive coaching inhaler device,  effectiveness =    75% (short ti)    Group D (now reclassified as E) in terms of symptom/risk and laba/lama/ICS  therefore appropriate rx at this point >>>  breztri and approp saba   For cough/ congestion > mucinex or mucinex dm  up to maximum of  1200 mg every 12 hours prn

## 2023-09-03 ENCOUNTER — Encounter: Payer: Self-pay | Admitting: Internal Medicine

## 2023-09-05 ENCOUNTER — Telehealth: Payer: Self-pay | Admitting: Internal Medicine

## 2023-09-05 NOTE — Telephone Encounter (Signed)
Christina Castaneda states needs sleep test results. Needs office notes for osa. Christina Castaneda phone number is [phone]. Fax number is [phone].

## 2023-10-09 ENCOUNTER — Other Ambulatory Visit (HOSPITAL_COMMUNITY): Payer: Self-pay | Admitting: Family Medicine

## 2023-10-09 DIAGNOSIS — Z720 Tobacco use: Secondary | ICD-10-CM

## 2024-01-08 ENCOUNTER — Encounter (HOSPITAL_COMMUNITY): Payer: Self-pay

## 2024-01-08 ENCOUNTER — Ambulatory Visit (HOSPITAL_COMMUNITY): Payer: Medicare Other

## 2024-02-19 ENCOUNTER — Emergency Department (HOSPITAL_COMMUNITY): Payer: Medicare Other

## 2024-02-19 ENCOUNTER — Other Ambulatory Visit: Payer: Self-pay

## 2024-02-19 ENCOUNTER — Encounter (HOSPITAL_COMMUNITY): Payer: Self-pay

## 2024-02-19 ENCOUNTER — Emergency Department (HOSPITAL_COMMUNITY)
Admission: EM | Admit: 2024-02-19 | Discharge: 2024-02-19 | Disposition: A | Discharge status: Home / Self Care | Payer: Medicare Other | Attending: Student | Admitting: Student

## 2024-02-19 DIAGNOSIS — D72829 Elevated white blood cell count, unspecified: Secondary | ICD-10-CM | POA: Insufficient documentation

## 2024-02-19 DIAGNOSIS — M7989 Other specified soft tissue disorders: Secondary | ICD-10-CM | POA: Diagnosis present

## 2024-02-19 DIAGNOSIS — L03114 Cellulitis of left upper limb: Secondary | ICD-10-CM | POA: Insufficient documentation

## 2024-02-19 DIAGNOSIS — R944 Abnormal results of kidney function studies: Secondary | ICD-10-CM | POA: Insufficient documentation

## 2024-02-19 LAB — CBC WITH DIFFERENTIAL/PLATELET
Abs Immature Granulocytes: 0.06 10*3/uL (ref 0.00–0.07)
Basophils Absolute: 0 10*3/uL (ref 0.0–0.1)
Basophils Relative: 0 %
Eosinophils Absolute: 0.1 10*3/uL (ref 0.0–0.5)
Eosinophils Relative: 1 %
HCT: 40.4 % (ref 36.0–46.0)
Hemoglobin: 13 g/dL (ref 12.0–15.0)
Immature Granulocytes: 1 %
Lymphocytes Relative: 13 %
Lymphs Abs: 1.5 10*3/uL (ref 0.7–4.0)
MCH: 33.5 pg (ref 26.0–34.0)
MCHC: 32.2 g/dL (ref 30.0–36.0)
MCV: 104.1 fL — ABNORMAL HIGH (ref 80.0–100.0)
Monocytes Absolute: 0.6 10*3/uL (ref 0.1–1.0)
Monocytes Relative: 5 %
Neutro Abs: 9 10*3/uL — ABNORMAL HIGH (ref 1.7–7.7)
Neutrophils Relative %: 80 %
Platelets: 270 10*3/uL (ref 150–400)
RBC: 3.88 MIL/uL (ref 3.87–5.11)
RDW: 14.3 % (ref 11.5–15.5)
WBC: 11.3 10*3/uL — ABNORMAL HIGH (ref 4.0–10.5)
nRBC: 0 % (ref 0.0–0.2)

## 2024-02-19 LAB — BASIC METABOLIC PANEL
Anion gap: 10 (ref 5–15)
BUN: 24 mg/dL — ABNORMAL HIGH (ref 8–23)
CO2: 20 mmol/L — ABNORMAL LOW (ref 22–32)
Calcium: 9.3 mg/dL (ref 8.9–10.3)
Chloride: 105 mmol/L (ref 98–111)
Creatinine, Ser: 1.08 mg/dL — ABNORMAL HIGH (ref 0.44–1.00)
GFR, Estimated: 56 mL/min — ABNORMAL LOW (ref >60)
Glucose, Bld: 87 mg/dL (ref 70–99)
Potassium: 5 mmol/L (ref 3.5–5.1)
Sodium: 135 mmol/L (ref 135–145)

## 2024-02-19 MED ORDER — ACETAMINOPHEN 325 MG PO TABS
650.0000 mg | ORAL_TABLET | Freq: Once | ORAL | Status: AC
  Administered 2024-02-19: 650 mg via ORAL
  Filled 2024-02-19: qty 2

## 2024-02-19 MED ORDER — HYDROCODONE-ACETAMINOPHEN 5-325 MG PO TABS
1.0000 | ORAL_TABLET | Freq: Once | ORAL | Status: AC
  Administered 2024-02-19: 1 via ORAL
  Filled 2024-02-19: qty 1

## 2024-02-19 MED ORDER — KETOROLAC TROMETHAMINE 15 MG/ML IJ SOLN
15.0000 mg | Freq: Once | INTRAMUSCULAR | Status: AC
  Administered 2024-02-19: 15 mg via INTRAMUSCULAR
  Filled 2024-02-19: qty 1

## 2024-02-19 MED ORDER — CLINDAMYCIN HCL 150 MG PO CAPS
450.0000 mg | ORAL_CAPSULE | Freq: Once | ORAL | Status: AC
  Administered 2024-02-19: 450 mg via ORAL
  Filled 2024-02-19: qty 3

## 2024-02-19 MED ORDER — CEFUROXIME AXETIL 250 MG PO TABS: 500.0000 mg | ORAL_TABLET | Freq: Once | ORAL | Status: DC

## 2024-02-19 MED ORDER — CLINDAMYCIN HCL 150 MG PO CAPS: 450.0000 mg | ORAL_CAPSULE | Freq: Three times a day (TID) | ORAL | 0 refills | Status: AC

## 2024-02-19 NOTE — ED Notes (Signed)
 [place] in room

## 2024-02-19 NOTE — ED Triage Notes (Signed)
 Pt arrived via POV from home c/o right arm pain. Pt reports pain began last night and reports pain radiates from her armpit to her finger tips. Pt endorses limited mobility and weakness in RUE since the pain began. Pt denies injury.

## 2024-02-19 NOTE — Discharge Instructions (Signed)
 You are seen in the ER today for left arm swelling redness and pain.  Your white blood cell count was slightly elevated, your labs were overall reassuring, though your kidney function was very slightly elevated from your baseline, make sure you drink plenty of fluids.  Your symptoms are consistent with a skin infection called cellulitis.  We are treating with antibiotics.  Ultrasound did not show a blood clot in your arm.  Follow-up close with your PCP.  If you develop fever, increasing redness or pain or any other worrisome changes come back to the ER.

## 2024-02-19 NOTE — ED Provider Notes (Signed)
 [place] EMERGENCY DEPARTMENT AT [facility] Provider Note   CSN: [id number] Arrival date & time: 02/19/24  1209     History  Chief Complaint  Patient presents with   Arm Pain    Christina Castaneda is a 68 y.o. female.  Presents with 1 day of redness swelling to the left forearm, pain shoots to her armpit and to her fingertips with burning in her fingertips.  The arm has been warm to touch.  She denies fever or chills.  Denies injury or trauma, has never had this in the past.   Arm Pain       Home Medications Prior to Admission medications   Medication Sig Start Date End Date Taking? Authorizing Provider  clindamycin (CLEOCIN) 150 MG capsule Take 3 capsules (450 mg total) by mouth 3 (three) times daily for 7 days. 02/19/24 02/26/24 Yes Keyia Moretto A, PA-C  acetaminophen (TYLENOL) 650 MG CR tablet Take 650 mg by mouth every 8 (eight) hours as needed for pain.    [provider]  albuterol (PROVENTIL HFA;VENTOLIN HFA) 108 (90 BASE) MCG/ACT inhaler Inhale 2 puffs into the lungs every 6 (six) hours as needed. Wheezing and shortness of breath    [provider]  allopurinol (ZYLOPRIM) 100 MG tablet Take 1 tablet (100 mg total) by mouth daily. 05/28/16   Ranelle Oyster, MD  Budeson-Glycopyrrol-Formoterol (BREZTRI AEROSPHERE) 160-9-4.8 MCG/ACT AERO Inhale 2 puffs into the lungs in the morning and at bedtime. 01/03/23   Nyoka Cowden, MD  Budeson-Glycopyrrol-Formoterol (BREZTRI AEROSPHERE) 160-9-4.8 MCG/ACT AERO Inhale 2 puffs into the lungs 2 (two) times daily. 02/13/23   Nyoka Cowden, MD  ergocalciferol (VITAMIN D2) 50000 UNITS capsule Take 50,000 Units by mouth once a week.    [provider]  gabapentin (NEURONTIN) 100 MG capsule Take by mouth.    [provider]  gabapentin (NEURONTIN) 300 MG capsule Take 1 capsule by mouth at bedtime. 02/27/21   [provider]  levothyroxine (SYNTHROID) 150 MCG tablet Take 1 tablet by mouth  daily. 07/21/18   [provider]  loratadine (CLARITIN) 10 MG tablet Take 10 mg by mouth daily.    [provider]  metoprolol tartrate (LOPRESSOR) 100 MG tablet Take 1 tablet (100 mg total) by mouth once for 1 dose. Please take one time dose 100mg  metoprolol tartrate 2 hr prior to cardiac CT for HR control IF HR >55bpm. 08/20/22 08/20/22  O'Neal, Ronnald Ramp, MD  senna-docusate (SENOKOT-S) 8.6-50 MG tablet Take 2 tablets by mouth 3 (three) times daily before meals. 05/23/16   Love, Evlyn Kanner, PA-C      Allergies    Penicillins    Review of Systems   Review of Systems  Physical Exam Updated Vital Signs BP (!) 134/93   Pulse 71   Temp 97.6 F (36.4 C) (Oral)   Resp 16   Ht 5\' 5"  (1.651 m)   Wt 108.9 kg   LMP 11/18/2012   SpO2 97%   BMI 39.94 kg/m  Physical Exam Vitals and nursing note reviewed.  Constitutional:      General: She is not in acute distress.    Appearance: She is well-developed.  HENT:     Head: Normocephalic and atraumatic.  Eyes:     Conjunctiva/sclera: Conjunctivae normal.  Cardiovascular:     Rate and Rhythm: Normal rate and regular rhythm.     Heart sounds: No murmur heard. Pulmonary:     Effort: Pulmonary effort is normal.  No respiratory distress.     Breath sounds: Normal breath sounds.  Abdominal:     Palpations: Abdomen is soft.     Tenderness: There is no abdominal tenderness.  Musculoskeletal:        General: Swelling present.     Cervical back: Neck supple.     Comments: Swelling to the left distal forearm with tenderness to the volar aspect of the forearm to the level of the elbow mild overlying erythema in this area as well with warmth.  No crepitus.  Normal range of motion of wrist elbow and shoulder.  Radial pulses intact.  Skin:    General: Skin is warm and dry.     Capillary Refill: Capillary refill takes less than 2 seconds.     Comments: Erythema and warmth to distal aspect of left forearm dorsally and volar aspect of  left forearm on the ulnar aspect.  No skin breakdown.  Neurological:     General: No focal deficit present.     Mental Status: She is alert and oriented to person, place, and time.  Psychiatric:        Mood and Affect: Mood normal.     ED Results / Procedures / Treatments   Labs (all labs ordered are listed, but only abnormal results are displayed) Labs Reviewed  CBC WITH DIFFERENTIAL/PLATELET - Abnormal; Notable for the following components:      Result Value   WBC 11.3 (*)    MCV 104.1 (*)    Neutro Abs 9.0 (*)    All other components within normal limits  BASIC METABOLIC PANEL - Abnormal; Notable for the following components:   CO2 20 (*)    BUN 24 (*)    Creatinine, Ser 1.08 (*)    GFR, Estimated 56 (*)    All other components within normal limits    EKG None  Radiology US Venous Img Upper Right (DVT Study) Result Date: 02/19/2024 CLINICAL DATA:  68 year old female with right upper extremity swelling and redness. EXAM: RIGHT UPPER EXTREMITY VENOUS DOPPLER ULTRASOUND TECHNIQUE: Gray-scale sonography with graded compression, as well as color Doppler and duplex ultrasound were performed to evaluate the upper extremity deep venous system from the level of the subclavian vein and including the jugular, axillary, basilic, radial, ulnar and upper cephalic vein. Spectral Doppler was utilized to evaluate flow at rest and with distal augmentation maneuvers. COMPARISON:  None Available. FINDINGS: Contralateral Subclavian Vein: Respiratory phasicity is normal and symmetric with the symptomatic side. No evidence of thrombus. Normal compressibility. Internal Jugular Vein: No evidence of thrombus. Normal compressibility, respiratory phasicity and response to augmentation. Subclavian Vein: No evidence of thrombus. Normal compressibility, respiratory phasicity and response to augmentation. Axillary Vein: No evidence of thrombus. Normal compressibility, respiratory phasicity and response to  augmentation. Cephalic Vein: No evidence of thrombus. Normal compressibility, respiratory phasicity and response to augmentation. Basilic Vein: No evidence of thrombus. Normal compressibility, respiratory phasicity and response to augmentation. Brachial Veins: No evidence of thrombus. Normal compressibility, respiratory phasicity and response to augmentation. Radial Veins: No evidence of thrombus. Normal compressibility, respiratory phasicity and response to augmentation. Ulnar Veins: No evidence of thrombus. Normal compressibility, respiratory phasicity and response to augmentation. Other Findings:  None visualized. IMPRESSION: No evidence of right upper extremity deep vein thrombosis or superficial thrombophlebitis. Marliss Coots, MD [facility] Electronically Signed   By: Marliss Coots M.D.   On: 02/19/2024 15:32    Procedures Procedures    Medications Ordered in  ED Medications  HYDROcodone-acetaminophen (NORCO/VICODIN) 5-325 MG per tablet 1 tablet (1 tablet Oral Given 02/19/24 1335)  acetaminophen (TYLENOL) tablet 650 mg (650 mg Oral Given 02/19/24 1610)  ketorolac (TORADOL) 15 MG/ML injection 15 mg (15 mg Intramuscular Given 02/19/24 1612)  clindamycin (CLEOCIN) capsule 450 mg (450 mg Oral Given 02/19/24 1611)    ED Course/ Medical Decision Making/ A&P                                 Medical Decision Making DDx: Abscess, cellulitis, DVT, gout, zoster, other  ED course: Left forearm pain and swelling with redness.  No dermatomal rash or vesicles to suggest herpes zoster.  She did have some swelling of the forearm and somewhat into the hand so upper extremity ultrasound ordered, no DVT.  Discussed with patient likely cellulitis given appearance and very mild leukocytosis; mild elevation in BUN, advised on p.o. intake.  Will start on Keflex as it is not pruritic  Amount and/or Complexity of Data Reviewed Labs: ordered. Decision-making  details documented in ED Course. Radiology: ordered. Decision-making details documented in ED Course.  Risk OTC drugs. Prescription drug management.           Final Clinical Impression(s) / ED Diagnoses Final diagnoses:  Cellulitis of left arm    Rx / DC Orders ED Discharge Orders          Ordered    clindamycin (CLEOCIN) 150 MG capsule  3 times daily        02/19/24 [address] 02/19/24 1943    Terrilee Files, MD 02/20/24 1011

## 2024-04-19 LAB — COLOGUARD: COLOGUARD: NEGATIVE

## 2024-05-27 ENCOUNTER — Encounter (HOSPITAL_COMMUNITY): Payer: Self-pay | Admitting: Emergency Medicine

## 2024-05-27 ENCOUNTER — Emergency Department (HOSPITAL_COMMUNITY)

## 2024-05-27 ENCOUNTER — Other Ambulatory Visit: Payer: Self-pay

## 2024-05-27 ENCOUNTER — Inpatient Hospital Stay (HOSPITAL_COMMUNITY)
Admission: EM | Admit: 2024-05-27 | Discharge: 2024-05-31 | DRG: 871 | Disposition: A | Discharge status: Home Health | Attending: Family Medicine | Admitting: Family Medicine

## 2024-05-27 DIAGNOSIS — Z6841 Body Mass Index (BMI) 40.0 and over, adult: Secondary | ICD-10-CM | POA: Diagnosis not present

## 2024-05-27 DIAGNOSIS — Z8249 Family history of ischemic heart disease and other diseases of the circulatory system: Secondary | ICD-10-CM | POA: Diagnosis not present

## 2024-05-27 DIAGNOSIS — R1310 Dysphagia, unspecified: Secondary | ICD-10-CM | POA: Diagnosis present

## 2024-05-27 DIAGNOSIS — Z7951 Long term (current) use of inhaled steroids: Secondary | ICD-10-CM

## 2024-05-27 DIAGNOSIS — R6510 Systemic inflammatory response syndrome (SIRS) of non-infectious origin without acute organ dysfunction: Secondary | ICD-10-CM | POA: Diagnosis present

## 2024-05-27 DIAGNOSIS — F17210 Nicotine dependence, cigarettes, uncomplicated: Secondary | ICD-10-CM | POA: Diagnosis present

## 2024-05-27 DIAGNOSIS — E669 Obesity, unspecified: Secondary | ICD-10-CM | POA: Diagnosis present

## 2024-05-27 DIAGNOSIS — E785 Hyperlipidemia, unspecified: Secondary | ICD-10-CM | POA: Diagnosis present

## 2024-05-27 DIAGNOSIS — Z888 Allergy status to other drugs, medicaments and biological substances status: Secondary | ICD-10-CM

## 2024-05-27 DIAGNOSIS — R5381 Other malaise: Secondary | ICD-10-CM | POA: Diagnosis present

## 2024-05-27 DIAGNOSIS — R0902 Hypoxemia: Secondary | ICD-10-CM | POA: Diagnosis present

## 2024-05-27 DIAGNOSIS — Z1152 Encounter for screening for COVID-19: Secondary | ICD-10-CM

## 2024-05-27 DIAGNOSIS — J9600 Acute respiratory failure, unspecified whether with hypoxia or hypercapnia: Secondary | ICD-10-CM | POA: Diagnosis not present

## 2024-05-27 DIAGNOSIS — E861 Hypovolemia: Secondary | ICD-10-CM | POA: Diagnosis present

## 2024-05-27 DIAGNOSIS — Z430 Encounter for attention to tracheostomy: Secondary | ICD-10-CM

## 2024-05-27 DIAGNOSIS — L89152 Pressure ulcer of sacral region, stage 2: Secondary | ICD-10-CM | POA: Diagnosis present

## 2024-05-27 DIAGNOSIS — N179 Acute kidney failure, unspecified: Secondary | ICD-10-CM | POA: Diagnosis present

## 2024-05-27 DIAGNOSIS — J159 Unspecified bacterial pneumonia: Secondary | ICD-10-CM | POA: Diagnosis present

## 2024-05-27 DIAGNOSIS — A419 Sepsis, unspecified organism: Principal | ICD-10-CM | POA: Diagnosis present

## 2024-05-27 DIAGNOSIS — I10 Essential (primary) hypertension: Secondary | ICD-10-CM | POA: Diagnosis present

## 2024-05-27 DIAGNOSIS — G894 Chronic pain syndrome: Secondary | ICD-10-CM | POA: Diagnosis present

## 2024-05-27 DIAGNOSIS — M109 Gout, unspecified: Secondary | ICD-10-CM | POA: Diagnosis present

## 2024-05-27 DIAGNOSIS — Z880 Allergy status to penicillin: Secondary | ICD-10-CM

## 2024-05-27 DIAGNOSIS — F4323 Adjustment disorder with mixed anxiety and depressed mood: Secondary | ICD-10-CM | POA: Diagnosis present

## 2024-05-27 DIAGNOSIS — J440 Chronic obstructive pulmonary disease with acute lower respiratory infection: Secondary | ICD-10-CM | POA: Diagnosis present

## 2024-05-27 DIAGNOSIS — R531 Weakness: Secondary | ICD-10-CM

## 2024-05-27 DIAGNOSIS — Z79890 Hormone replacement therapy (postmenopausal): Secondary | ICD-10-CM | POA: Diagnosis not present

## 2024-05-27 DIAGNOSIS — E039 Hypothyroidism, unspecified: Secondary | ICD-10-CM | POA: Diagnosis present

## 2024-05-27 DIAGNOSIS — E538 Deficiency of other specified B group vitamins: Secondary | ICD-10-CM | POA: Diagnosis present

## 2024-05-27 DIAGNOSIS — G4733 Obstructive sleep apnea (adult) (pediatric): Secondary | ICD-10-CM | POA: Diagnosis present

## 2024-05-27 DIAGNOSIS — E8721 Acute metabolic acidosis: Secondary | ICD-10-CM | POA: Diagnosis present

## 2024-05-27 DIAGNOSIS — R6520 Severe sepsis without septic shock: Secondary | ICD-10-CM | POA: Diagnosis present

## 2024-05-27 DIAGNOSIS — K219 Gastro-esophageal reflux disease without esophagitis: Secondary | ICD-10-CM | POA: Diagnosis present

## 2024-05-27 DIAGNOSIS — J189 Pneumonia, unspecified organism: Principal | ICD-10-CM

## 2024-05-27 DIAGNOSIS — Z833 Family history of diabetes mellitus: Secondary | ICD-10-CM

## 2024-05-27 DIAGNOSIS — Z825 Family history of asthma and other chronic lower respiratory diseases: Secondary | ICD-10-CM

## 2024-05-27 DIAGNOSIS — Z720 Tobacco use: Secondary | ICD-10-CM | POA: Diagnosis present

## 2024-05-27 DIAGNOSIS — J449 Chronic obstructive pulmonary disease, unspecified: Secondary | ICD-10-CM | POA: Diagnosis present

## 2024-05-27 DIAGNOSIS — Z79899 Other long term (current) drug therapy: Secondary | ICD-10-CM

## 2024-05-27 LAB — PHOSPHORUS: Phosphorus: 2.9 mg/dL (ref 2.5–4.6)

## 2024-05-27 LAB — COMPREHENSIVE METABOLIC PANEL WITH GFR
ALT: 17 U/L (ref 0–44)
AST: 23 U/L (ref 15–41)
Albumin: 2.7 g/dL — ABNORMAL LOW (ref 3.5–5.0)
Alkaline Phosphatase: 61 U/L (ref 38–126)
Anion gap: 9 (ref 5–15)
BUN: 41 mg/dL — ABNORMAL HIGH (ref 8–23)
CO2: 17 mmol/L — ABNORMAL LOW (ref 22–32)
Calcium: 9.6 mg/dL (ref 8.9–10.3)
Chloride: 107 mmol/L (ref 98–111)
Creatinine, Ser: 1.77 mg/dL — ABNORMAL HIGH (ref 0.44–1.00)
GFR, Estimated: 31 mL/min — ABNORMAL LOW (ref >60)
Glucose, Bld: 94 mg/dL (ref 70–99)
Potassium: 4.4 mmol/L (ref 3.5–5.1)
Sodium: 133 mmol/L — ABNORMAL LOW (ref 135–145)
Total Bilirubin: 0.4 mg/dL (ref 0.0–1.2)
Total Protein: 7.4 g/dL (ref 6.5–8.1)

## 2024-05-27 LAB — CBC WITH DIFFERENTIAL/PLATELET
Abs Immature Granulocytes: 0.15 10*3/uL — ABNORMAL HIGH (ref 0.00–0.07)
Basophils Absolute: 0 10*3/uL (ref 0.0–0.1)
Basophils Relative: 0 %
Eosinophils Absolute: 0 10*3/uL (ref 0.0–0.5)
Eosinophils Relative: 0 %
HCT: 41.2 % (ref 36.0–46.0)
Hemoglobin: 12.7 g/dL (ref 12.0–15.0)
Immature Granulocytes: 1 %
Lymphocytes Relative: 7 %
Lymphs Abs: 1.4 10*3/uL (ref 0.7–4.0)
MCH: 31.4 pg (ref 26.0–34.0)
MCHC: 30.8 g/dL (ref 30.0–36.0)
MCV: 102 fL — ABNORMAL HIGH (ref 80.0–100.0)
Monocytes Absolute: 1.4 10*3/uL — ABNORMAL HIGH (ref 0.1–1.0)
Monocytes Relative: 7 %
Neutro Abs: 16.8 10*3/uL — ABNORMAL HIGH (ref 1.7–7.7)
Neutrophils Relative %: 85 %
Platelets: 320 10*3/uL (ref 150–400)
RBC: 4.04 MIL/uL (ref 3.87–5.11)
RDW: 14.8 % (ref 11.5–15.5)
WBC: 19.7 10*3/uL — ABNORMAL HIGH (ref 4.0–10.5)
nRBC: 0 % (ref 0.0–0.2)

## 2024-05-27 LAB — RESP PANEL BY RT-PCR (RSV, FLU A&B, COVID)  RVPGX2
Influenza A by PCR: NEGATIVE
Influenza B by PCR: NEGATIVE
Resp Syncytial Virus by PCR: NEGATIVE
SARS Coronavirus 2 by RT PCR: NEGATIVE

## 2024-05-27 LAB — APTT: aPTT: 29 s (ref 24–36)

## 2024-05-27 LAB — LACTIC ACID, PLASMA
Lactic Acid, Venous: 1.6 mmol/L (ref 0.5–1.9)
Lactic Acid, Venous: 1.8 mmol/L (ref 0.5–1.9)
Lactic Acid, Venous: 1.4 mmol/L (ref 0.5–1.9)
Lactic Acid, Venous: 1 mmol/L (ref 0.5–1.9)

## 2024-05-27 LAB — PROTIME-INR
INR: 1.3 — ABNORMAL HIGH (ref 0.8–1.2)
Prothrombin Time: 16.5 s — ABNORMAL HIGH (ref 11.4–15.2)

## 2024-05-27 LAB — TROPONIN I (HIGH SENSITIVITY)
Troponin I (High Sensitivity): 8 ng/L (ref <18)
Troponin I (High Sensitivity): 9 ng/L (ref <18)

## 2024-05-27 LAB — MAGNESIUM: Magnesium: 1.8 mg/dL (ref 1.7–2.4)

## 2024-05-27 LAB — PROCALCITONIN: Procalcitonin: 0.32 ng/mL

## 2024-05-27 LAB — HIV ANTIBODY (ROUTINE TESTING W REFLEX): HIV Screen 4th Generation wRfx: NONREACTIVE

## 2024-05-27 MED ORDER — ONDANSETRON HCL 4 MG PO TABS: 4.0000 mg | ORAL_TABLET | Freq: Four times a day (QID) | ORAL | Status: DC | PRN

## 2024-05-27 MED ORDER — HEPARIN SODIUM (PORCINE) 5000 UNIT/ML IJ SOLN
5000.0000 [IU] | Freq: Three times a day (TID) | INTRAMUSCULAR | Status: DC
  Administered 2024-05-27 – 2024-05-31 (×12): 5000 [IU] via SUBCUTANEOUS
  Filled 2024-05-27 (×12): qty 1

## 2024-05-27 MED ORDER — IPRATROPIUM BROMIDE 0.02 % IN SOLN: 0.5000 mg | Freq: Four times a day (QID) | RESPIRATORY_TRACT | Status: DC | PRN

## 2024-05-27 MED ORDER — LEVALBUTEROL HCL 0.63 MG/3ML IN NEBU
0.6300 mg | INHALATION_SOLUTION | Freq: Three times a day (TID) | RESPIRATORY_TRACT | Status: DC
  Administered 2024-05-28: 0.63 mg via RESPIRATORY_TRACT
  Filled 2024-05-27: qty 3

## 2024-05-27 MED ORDER — HYDROMORPHONE HCL 1 MG/ML IJ SOLN
0.5000 mg | INTRAMUSCULAR | Status: DC | PRN
  Administered 2024-05-28: 0.5 mg via INTRAVENOUS
  Administered 2024-05-29: 1 mg via INTRAVENOUS
  Filled 2024-05-27 (×2): qty 1

## 2024-05-27 MED ORDER — VITAMIN D 25 MCG (1000 UNIT) PO TABS
2000.0000 [IU] | ORAL_TABLET | Freq: Every day | ORAL | Status: DC
  Administered 2024-05-28 – 2024-05-31 (×4): 2000 [IU] via ORAL
  Filled 2024-05-27 (×4): qty 2

## 2024-05-27 MED ORDER — TRAZODONE HCL 50 MG PO TABS
25.0000 mg | ORAL_TABLET | Freq: Every evening | ORAL | Status: DC | PRN
Start: 2024-05-27 — End: 2024-05-31
  Administered 2024-05-29 – 2024-05-30 (×2): 25 mg via ORAL
  Filled 2024-05-27 (×2): qty 1

## 2024-05-27 MED ORDER — LEVALBUTEROL HCL 0.63 MG/3ML IN NEBU: 0.6300 mg | INHALATION_SOLUTION | Freq: Four times a day (QID) | RESPIRATORY_TRACT | Status: DC | PRN

## 2024-05-27 MED ORDER — SENNOSIDES-DOCUSATE SODIUM 8.6-50 MG PO TABS
2.0000 | ORAL_TABLET | Freq: Three times a day (TID) | ORAL | Status: DC
  Administered 2024-05-28 – 2024-05-30 (×8): 2 via ORAL
  Filled 2024-05-27 (×11): qty 2

## 2024-05-27 MED ORDER — IPRATROPIUM BROMIDE 0.02 % IN SOLN
0.5000 mg | Freq: Three times a day (TID) | RESPIRATORY_TRACT | Status: DC
  Administered 2024-05-27: 0.5 mg via RESPIRATORY_TRACT
  Filled 2024-05-27: qty 2.5

## 2024-05-27 MED ORDER — ALLOPURINOL 100 MG PO TABS
100.0000 mg | ORAL_TABLET | Freq: Every day | ORAL | Status: DC
  Administered 2024-05-28 – 2024-05-31 (×4): 100 mg via ORAL
  Filled 2024-05-27 (×7): qty 1

## 2024-05-27 MED ORDER — LACTATED RINGERS IV BOLUS (SEPSIS)
1000.0000 mL | Freq: Once | INTRAVENOUS | Status: AC
  Administered 2024-05-27: 1000 mL via INTRAVENOUS

## 2024-05-27 MED ORDER — NYSTATIN 100000 UNIT/GM EX POWD
Freq: Three times a day (TID) | CUTANEOUS | Status: DC
  Filled 2024-05-27: qty 15
  Filled 2024-05-27: qty 30

## 2024-05-27 MED ORDER — LEVALBUTEROL HCL 0.63 MG/3ML IN NEBU
0.6300 mg | INHALATION_SOLUTION | Freq: Three times a day (TID) | RESPIRATORY_TRACT | Status: DC
  Administered 2024-05-27: 0.63 mg via RESPIRATORY_TRACT
  Filled 2024-05-27: qty 3

## 2024-05-27 MED ORDER — BISACODYL 5 MG PO TBEC
5.0000 mg | DELAYED_RELEASE_TABLET | Freq: Every day | ORAL | Status: DC | PRN
Start: 2024-05-27 — End: 2024-05-31

## 2024-05-27 MED ORDER — SODIUM CHLORIDE 0.9% FLUSH
3.0000 mL | Freq: Two times a day (BID) | INTRAVENOUS | Status: DC
  Administered 2024-05-28 – 2024-05-31 (×7): 3 mL via INTRAVENOUS

## 2024-05-27 MED ORDER — GABAPENTIN 100 MG PO CAPS: 100.0000 mg | ORAL_CAPSULE | Freq: Every day | ORAL | Status: DC

## 2024-05-27 MED ORDER — SODIUM CHLORIDE 0.9 % IV SOLN
500.0000 mg | INTRAVENOUS | Status: DC
  Administered 2024-05-28: 500 mg via INTRAVENOUS
  Filled 2024-05-27: qty 5

## 2024-05-27 MED ORDER — ONDANSETRON HCL 4 MG/2ML IJ SOLN
4.0000 mg | Freq: Four times a day (QID) | INTRAMUSCULAR | Status: DC | PRN
Start: 2024-05-27 — End: 2024-05-31
  Administered 2024-05-29 – 2024-05-31 (×2): 4 mg via INTRAVENOUS
  Filled 2024-05-27 (×2): qty 2

## 2024-05-27 MED ORDER — LEVOTHYROXINE SODIUM 50 MCG PO TABS
150.0000 ug | ORAL_TABLET | Freq: Every day | ORAL | Status: DC
  Administered 2024-05-28 – 2024-05-31 (×4): 150 ug via ORAL
  Filled 2024-05-27 (×4): qty 3

## 2024-05-27 MED ORDER — ROSUVASTATIN CALCIUM 10 MG PO TABS
10.0000 mg | ORAL_TABLET | Freq: Every day | ORAL | Status: DC
  Administered 2024-05-28 – 2024-05-31 (×4): 10 mg via ORAL
  Filled 2024-05-27 (×4): qty 1

## 2024-05-27 MED ORDER — SODIUM CHLORIDE 0.9 % IV SOLN
2.0000 g | Freq: Once | INTRAVENOUS | Status: AC
  Administered 2024-05-27: 2 g via INTRAVENOUS
  Filled 2024-05-27: qty 20

## 2024-05-27 MED ORDER — BUDESON-GLYCOPYRROL-FORMOTEROL 160-9-4.8 MCG/ACT IN AERO: 2.0000 | INHALATION_SPRAY | Freq: Three times a day (TID) | RESPIRATORY_TRACT | Status: DC

## 2024-05-27 MED ORDER — SODIUM CHLORIDE 0.9 % IV SOLN
500.0000 mg | Freq: Once | INTRAVENOUS | Status: AC
  Administered 2024-05-27: 500 mg via INTRAVENOUS
  Filled 2024-05-27: qty 5

## 2024-05-27 MED ORDER — LACTATED RINGERS IV SOLN
150.0000 mL/h | INTRAVENOUS | Status: DC
  Administered 2024-05-27 – 2024-05-28 (×2): 150 mL/h via INTRAVENOUS

## 2024-05-27 MED ORDER — FLEET ENEMA RE ENEM: 1.0000 | ENEMA | Freq: Once | RECTAL | Status: DC | PRN

## 2024-05-27 MED ORDER — SODIUM CHLORIDE 0.9 % IV SOLN: INTRAVENOUS | Status: DC

## 2024-05-27 MED ORDER — IPRATROPIUM BROMIDE 0.02 % IN SOLN
0.5000 mg | Freq: Three times a day (TID) | RESPIRATORY_TRACT | Status: DC
  Administered 2024-05-28: 0.5 mg via RESPIRATORY_TRACT
  Filled 2024-05-27: qty 2.5

## 2024-05-27 MED ORDER — SODIUM CHLORIDE 0.9% FLUSH
3.0000 mL | Freq: Two times a day (BID) | INTRAVENOUS | Status: DC
  Administered 2024-05-27 – 2024-05-28 (×3): 3 mL via INTRAVENOUS

## 2024-05-27 MED ORDER — ACETAMINOPHEN 650 MG RE SUPP: 650.0000 mg | Freq: Four times a day (QID) | RECTAL | Status: DC | PRN

## 2024-05-27 MED ORDER — HYDRALAZINE HCL 20 MG/ML IJ SOLN
10.0000 mg | INTRAMUSCULAR | Status: DC | PRN
Start: 2024-05-27 — End: 2024-05-31

## 2024-05-27 MED ORDER — ACETAMINOPHEN 325 MG PO TABS
650.0000 mg | ORAL_TABLET | Freq: Four times a day (QID) | ORAL | Status: DC | PRN
  Administered 2024-05-31: 650 mg via ORAL
  Filled 2024-05-27: qty 2

## 2024-05-27 MED ORDER — FLUTICASONE FUROATE-VILANTEROL 200-25 MCG/ACT IN AEPB: 1.0000 | INHALATION_SPRAY | Freq: Every day | RESPIRATORY_TRACT | Status: DC

## 2024-05-27 MED ORDER — SENNOSIDES-DOCUSATE SODIUM 8.6-50 MG PO TABS
1.0000 | ORAL_TABLET | Freq: Every evening | ORAL | Status: DC | PRN
Start: 2024-05-27 — End: 2024-05-31

## 2024-05-27 MED ORDER — SODIUM CHLORIDE 0.9 % IV SOLN
1.0000 g | INTRAVENOUS | Status: DC
  Administered 2024-05-28: 1 g via INTRAVENOUS
  Filled 2024-05-27: qty 10

## 2024-05-27 MED ORDER — FLUTICASONE FUROATE-VILANTEROL 200-25 MCG/ACT IN AEPB
1.0000 | INHALATION_SPRAY | Freq: Every day | RESPIRATORY_TRACT | Status: DC
  Administered 2024-05-28 – 2024-05-30 (×3): 1 via RESPIRATORY_TRACT
  Filled 2024-05-27: qty 28

## 2024-05-27 MED ORDER — SODIUM CHLORIDE 0.9 % IV SOLN: INTRAVENOUS | Status: AC

## 2024-05-27 MED ORDER — LEVOFLOXACIN IN D5W 750 MG/150ML IV SOLN: 750.0000 mg | INTRAVENOUS | Status: DC

## 2024-05-27 MED ORDER — OXYCODONE HCL 5 MG PO TABS
5.0000 mg | ORAL_TABLET | ORAL | Status: DC | PRN
  Administered 2024-05-27 – 2024-05-31 (×8): 5 mg via ORAL
  Filled 2024-05-27 (×8): qty 1

## 2024-05-27 MED ORDER — LACTATED RINGERS IV BOLUS
1000.0000 mL | Freq: Once | INTRAVENOUS | Status: AC
  Administered 2024-05-27: 1000 mL via INTRAVENOUS

## 2024-05-27 NOTE — Assessment & Plan Note (Signed)
-   Daily as needed analgesics, continue home allopurinol  Or holding naproxen

## 2024-05-27 NOTE — Assessment & Plan Note (Signed)
 Body mass index is 40.77 kg/m. - Cholesterol weight loss program, follow-up with -Dietary modification increase activity

## 2024-05-27 NOTE — ED Triage Notes (Signed)
 Pt in from PCP via [facility] with c/o hypotension and generalized weakness. Per pt, she has been increasingly weak with poor appetite x 2 wks. Also c/o worsening cough x 1 wk. Clinic reports pt was 60/40 while at their facility. Arrives a&ox4, BP 94/79 EMS VS: 114/76 105HR 96%RA CBG 106 20G RAC

## 2024-05-27 NOTE — Assessment & Plan Note (Addendum)
-   Hypotensive on arrival, with holding home BP meds -Holding metoprolol , and Benicar  -BP closely

## 2024-05-27 NOTE — Assessment & Plan Note (Signed)
 Consulting PT OT, fall precautions

## 2024-05-27 NOTE — Assessment & Plan Note (Signed)
- -  Currently stable, monitor closely, as needed

## 2024-05-27 NOTE — Progress Notes (Signed)
 Elink following code sepsis

## 2024-05-27 NOTE — Assessment & Plan Note (Signed)
-   Discussed regarding smoking cessation -Provided

## 2024-05-27 NOTE — Assessment & Plan Note (Signed)
-   2 L of oxygen, satting 90% -Heart rate closely, as needed DuoNeb bronchodilators, Reviewing home inhalers will resume according

## 2024-05-27 NOTE — ED Notes (Signed)
 Pt has area underneath L pannus which appears to be yeast. 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 Purewick also placed at this time. Pt repositioned and is comfortable at this time, husband at bedside.

## 2024-05-27 NOTE — ED Provider Notes (Signed)
 [place] EMERGENCY DEPARTMENT AT [facility] Provider Note   CSN: [id number] Arrival date & time: 05/27/24  1115     History  Chief Complaint  Patient presents with   Weakness    Christina Castaneda is a 68 y.o. female.  HPI 68 year old female presents with weakness and hypotension.  Patient has been feeling all over weak for about 2 weeks.  She has had a cough for about a week and a half.  Poor p.o. intake and now is having dry heaving when trying to eat.  She has been feeling diffuse weakness but no unilateral weakness.  No headache, chest pain.  She does feel more short of breath. No blood in the stools. She felt like she has been having a low grade fever.   Home Medications Prior to Admission medications   Medication Sig Start Date End Date Taking? Authorizing Provider  fluticasone-salmeterol (ADVAIR) 500-50 MCG/ACT AEPB Inhale 1 puff into the lungs in the morning and at bedtime. 05/13/24  Yes [provider]  acetaminophen  (TYLENOL ) 500 MG tablet Take 1,000 mg by mouth every 6 (six) hours.    [provider]  acetaminophen  (TYLENOL ) 650 MG CR tablet Take 650 mg by mouth every 8 (eight) hours as needed for pain.    [provider]  albuterol  (PROVENTIL  HFA;VENTOLIN  HFA) 108 (90 BASE) MCG/ACT inhaler Inhale 2 puffs into the lungs every 6 (six) hours as needed. Wheezing and shortness of breath    [provider]  allopurinol  (ZYLOPRIM ) 100 MG tablet Take 1 tablet (100 mg total) by mouth daily. 05/28/16   Rawland Caddy, MD  Budeson-Glycopyrrol-Formoterol (BREZTRI  AEROSPHERE) 160-9-4.8 MCG/ACT AERO Inhale 2 puffs into the lungs in the morning and at bedtime. 01/03/23   Diamond Formica, MD  Budeson-Glycopyrrol-Formoterol (BREZTRI  AEROSPHERE) 160-9-4.8 MCG/ACT AERO Inhale 2 puffs into the lungs 2 (two) times daily. 02/13/23   Wert, Michael B, MD  ergocalciferol (VITAMIN D2) 50000 UNITS capsule Take 50,000 Units by mouth every Saturday.     [provider]  gabapentin (NEURONTIN) 100 MG capsule Take by mouth.    [provider]  gabapentin (NEURONTIN) 300 MG capsule Take 1 capsule by mouth at bedtime. 02/27/21   [provider]  INCRUSE ELLIPTA 62.5 MCG/ACT AEPB Inhale 1 puff into the lungs daily.    [provider]  levothyroxine  (SYNTHROID ) 150 MCG tablet Take 1 tablet by mouth daily. 07/21/18   [provider]  loratadine (CLARITIN) 10 MG tablet Take 10 mg by mouth daily.    [provider]  metoprolol  tartrate (LOPRESSOR ) 100 MG tablet Take 1 tablet (100 mg total) by mouth once for 1 dose. Please take one time dose 100mg  metoprolol  tartrate 2 hr prior to cardiac CT for HR control IF HR >55bpm. 08/20/22 08/20/22  O'Neal, Cathay Clonts, MD  naproxen  (NAPROSYN ) 500 MG tablet TAKE 1 TABLET BY MOUTH with food OR MILK AS NEEDED EVERY TWELVE HOURS    [provider]  rosuvastatin (CRESTOR) 10 MG tablet Take 10 mg by mouth daily.    [provider]  senna (SENOKOT) 8.6 MG tablet Take 2 tablets by mouth at bedtime as needed for constipation.    [provider]  senna-docusate (SENOKOT-S) 8.6-50 MG tablet Take 2 tablets by mouth 3 (three) times daily before meals. 05/23/16   Love, Renay Carota, PA-C      Allergies    Penicillins, Biphosphate, and Fluticasone    Review of Systems   Review of  Systems  Constitutional:  Positive for fatigue and fever.  Respiratory:  Positive for cough and shortness of breath.   Cardiovascular:  Negative for chest pain.  Gastrointestinal:  Positive for nausea and vomiting. Negative for abdominal pain, blood in stool and diarrhea.  Neurological:  Positive for weakness. Negative for headaches.    Physical Exam Updated Vital Signs BP (!) 97/58   Pulse 93   Temp 97.9 F (36.6 C) (Axillary)   Resp (!) 25   Wt 111.1 kg   LMP 11/18/2012   SpO2 99%   BMI 40.77 kg/m  Physical Exam Vitals and nursing note reviewed.  Constitutional:       General: She is not in acute distress.    Appearance: She is well-developed. She is not ill-appearing or diaphoretic.  HENT:     Head: Normocephalic and atraumatic.     Mouth/Throat:     Mouth: Mucous membranes are dry.  Eyes:     Extraocular Movements: Extraocular movements intact.     Pupils: Pupils are equal, round, and reactive to light.  Cardiovascular:     Rate and Rhythm: Normal rate and regular rhythm.     Heart sounds: Normal heart sounds.  Pulmonary:     Effort: Pulmonary effort is normal.     Breath sounds: Rales present.  Abdominal:     General: There is no distension.     Palpations: Abdomen is soft.     Tenderness: There is no abdominal tenderness.  Musculoskeletal:     Right lower leg: No edema.     Left lower leg: No edema.  Skin:    General: Skin is warm and dry.  Neurological:     Mental Status: She is alert.     Comments: Patient has 4/5 grip strength/upper extremity strength 3/5 strength in bilateral lower extremities.     ED Results / Procedures / Treatments   Labs (all labs ordered are listed, but only abnormal results are displayed) Labs Reviewed  COMPREHENSIVE METABOLIC PANEL WITH GFR - Abnormal; Notable for the following components:      Result Value   Sodium 133 (*)    CO2 17 (*)    BUN 41 (*)    Creatinine, Ser 1.77 (*)    Albumin 2.7 (*)    GFR, Estimated 31 (*)    All other components within normal limits  CBC WITH DIFFERENTIAL/PLATELET - Abnormal; Notable for the following components:   WBC 19.7 (*)    MCV 102.0 (*)    Neutro Abs 16.8 (*)    Monocytes Absolute 1.4 (*)    Abs Immature Granulocytes 0.15 (*)    All other components within normal limits  PROTIME-INR - Abnormal; Notable for the following components:   Prothrombin Time 16.5 (*)    INR 1.3 (*)    All other components within normal limits  CULTURE, BLOOD (ROUTINE X 2)  CULTURE, BLOOD (ROUTINE X 2)  EXPECTORATED SPUTUM ASSESSMENT W GRAM STAIN, RFLX TO RESP C   EXPECTORATED SPUTUM ASSESSMENT W GRAM STAIN, RFLX TO RESP C  RESP PANEL BY RT-PCR (RSV, FLU A&B, COVID)  [id number]  LACTIC ACID, PLASMA  LACTIC ACID, PLASMA  PROCALCITONIN  MAGNESIUM   PHOSPHORUS  URINALYSIS, W/ REFLEX TO CULTURE (INFECTION SUSPECTED)  HIV ANTIBODY (ROUTINE TESTING W REFLEX)  TROPONIN I (HIGH SENSITIVITY)  TROPONIN I (HIGH SENSITIVITY)    EKG EKG Interpretation Date/Time:  Wednesday May 27 2024 11:25:04 EDT Ventricular Rate:  93 PR Interval:  169 QRS Duration:  195  QT Interval:  391 QTC Calculation: 476 R Axis:   69  Text Interpretation: Sinus rhythm Atrial premature complexes Consider left atrial enlargement Right bundle branch block overall similar to 2022 Confirmed by Jerilynn Montenegro [phone]) on 05/27/2024 11:44:54 AM  Radiology DG Chest Port 1 View Result Date: 05/27/2024 CLINICAL DATA:  [id number] Sepsis (HCC) [id number]. Hypertension. Weakness. EXAM: PORTABLE CHEST 1 VIEW COMPARISON:  04/17/2016. FINDINGS: There are heterogeneous opacities in the left retrocardiac region, which may represent atelectasis versus pneumonitis. Correlate clinically. Bilateral lung fields are otherwise clear. There is subtle blunting of left lateral costophrenic angle, which may be due to overlying lung opacities versus trace pleural effusion. Right lateral costophrenic angle is clear. No pneumothorax on either side. Normal cardio-mediastinal silhouette. No acute osseous abnormalities. The soft tissues are within normal limits. IMPRESSION: *Heterogeneous opacities in the left retrocardiac region, which may represent atelectasis versus pneumonitis. Electronically Signed   By: Beula Brunswick M.D.   On: 05/27/2024 11:56    Procedures .Critical Care  Performed by: Jerilynn Montenegro, MD Authorized by: Jerilynn Montenegro, MD   Critical care provider statement:    Critical care time (minutes):  30   Critical care time was exclusive of:  Separately billable procedures and treating other patients    Critical care was necessary to treat or prevent imminent or life-threatening deterioration of the following conditions:  Sepsis and renal failure   Critical care was time spent personally by me on the following activities:  Development of treatment plan with patient or surrogate, discussions with consultants, evaluation of patient's response to treatment, examination of patient, ordering and review of laboratory studies, ordering and review of radiographic studies, ordering and performing treatments and interventions, pulse oximetry, re-evaluation of patient's condition and review of old charts     Medications Ordered in ED Medications  heparin injection 5,000 Units (has no administration in time range)  sodium chloride  flush (NS) 0.9 % injection 3 mL (3 mLs Intravenous Given 05/27/24 1415)  sodium chloride  flush (NS) 0.9 % injection 3 mL (3 mLs Intravenous Not Given 05/27/24 1419)  acetaminophen  (TYLENOL ) tablet 650 mg (has no administration in time range)    Or  acetaminophen  (TYLENOL ) suppository 650 mg (has no administration in time range)  oxyCODONE  (Oxy IR/ROXICODONE ) immediate release tablet 5 mg (has no administration in time range)  HYDROmorphone  (DILAUDID ) injection 0.5-1 mg (has no administration in time range)  traZODone  (DESYREL ) tablet 25 mg (has no administration in time range)  senna-docusate (Senokot-S) tablet 1 tablet (has no administration in time range)  bisacodyl (DULCOLAX) EC tablet 5 mg (has no administration in time range)  sodium phosphate  (FLEET) enema 1 enema (has no administration in time range)  ondansetron  (ZOFRAN ) tablet 4 mg (has no administration in time range)    Or  ondansetron  (ZOFRAN ) injection 4 mg (has no administration in time range)  ipratropium (ATROVENT) nebulizer solution 0.5 mg (has no administration in time range)  levalbuterol (XOPENEX) nebulizer solution 0.63 mg (has no administration in time range)  hydrALAZINE (APRESOLINE) injection 10 mg (has no  administration in time range)  0.9 %  sodium chloride  infusion (has no administration in time range)  allopurinol  (ZYLOPRIM ) tablet 100 mg (has no administration in time range)  rosuvastatin (CRESTOR) tablet 10 mg (has no administration in time range)  levothyroxine  (SYNTHROID ) tablet 150 mcg (has no administration in time range)  senna-docusate (Senokot-S) tablet 2 tablet (has no administration in time range)  gabapentin (NEURONTIN) capsule 100 mg (has no administration in time range)  ergocalciferol (VITAMIN D2) capsule 50,000 Units (has no administration in time range)  budesonide -glycopyrrolate -formoterol (BREZTRI ) 160-9-4.8 MCG/ACT inhaler 2 puff (has no administration in time range)  fluticasone furoate-vilanterol (BREO ELLIPTA) 200-25 MCG/ACT 1 puff (has no administration in time range)  cefTRIAXone (ROCEPHIN) 1 g in sodium chloride  0.9 % 100 mL IVPB (has no administration in time range)  azithromycin (ZITHROMAX) 500 mg in sodium chloride  0.9 % 250 mL IVPB (has no administration in time range)  lactated ringers  bolus 1,000 mL (0 mLs Intravenous Stopped 05/27/24 1315)  lactated ringers  bolus 1,000 mL (0 mLs Intravenous Stopped 05/27/24 1452)  cefTRIAXone (ROCEPHIN) 2 g in sodium chloride  0.9 % 100 mL IVPB (0 g Intravenous Stopped 05/27/24 1249)  azithromycin (ZITHROMAX) 500 mg in sodium chloride  0.9 % 250 mL IVPB (0 mg Intravenous Stopped 05/27/24 1404)    ED Course/ Medical Decision Making/ A&P                                 Medical Decision Making Amount and/or Complexity of Data Reviewed Labs: ordered.    Details: Acute kidney injury Radiology: ordered and independent interpretation performed.    Details: Left-sided pneumonia ECG/medicine tests: ordered and independent interpretation performed.    Details: Right bundle branch block  Risk Decision regarding hospitalization.   Patient was evaluated for possible sepsis given her hypotension and finding of pneumonia on x-ray.  She  does have a leukocytosis and an AKI though her lactate is normal.  She has responded to fluids and her blood pressure is coming up above 100 systolic.  She is not in any respiratory distress.  She was given IV Rocephin and azithromycin for the pneumonia.  She states she had a penicillin allergy as a child but does not know the reaction.  I think ceftriaxone is probably fine.  She will need admission, discussed with Dr. Eilene Grater.        Final Clinical Impression(s) / ED Diagnoses Final diagnoses:  Community acquired pneumonia of left lung, unspecified part of lung  Acute kidney injury [facility])    Rx / DC Orders ED Discharge Orders     None         Jerilynn Montenegro, MD 05/27/24 1458

## 2024-05-27 NOTE — Progress Notes (Signed)
   05/27/24 2202  TOC Brief Assessment  Insurance and Status Reviewed  Patient has primary care physician Yes  Home environment has been reviewed From home  Prior level of function: Min assist  Prior/Current Home Services No current home services  Social Drivers of Health Review SDOH reviewed interventions complete (smoking cessation added to AVS)  Readmission risk has been reviewed Yes  Transition of care needs no transition of care needs at this time   Transition of Care Department [facility]) has reviewed patient and will continue to monitor.

## 2024-05-27 NOTE — Assessment & Plan Note (Signed)
Continue low dose statin.  

## 2024-05-27 NOTE — Assessment & Plan Note (Signed)
-   Consulting PT OT for eval -Fall precautions

## 2024-05-27 NOTE — Assessment & Plan Note (Signed)
-   Home dose Synthroid , checking TSH

## 2024-05-27 NOTE — Progress Notes (Signed)
 Patient states she hasn't voided since last night, performed bladder scan and in her bladder at this time. Dr. Eilene Grater is aware and if patient becomes uncomfortable or over in urinary bladder may do in and out

## 2024-05-27 NOTE — Assessment & Plan Note (Signed)
-   Stable 2 L oxygen

## 2024-05-27 NOTE — H&P (Addendum)
 History and Physical   Patient: Christina Castaneda                            PCP: Malka Sea, DO                    DOB: Feb 15, 1956            DOA: 05/27/2024 NWG:[id number]             DOS: 05/27/2024, 4:38 PM  Mullis, Joseph Nickel, DO  Patient coming from:   HOME  I have personally reviewed patient's medical records, in electronic medical records, including:  [facility], and care everywhere.    Chief Complaint:   Chief Complaint  Patient presents with   Weakness    History of present illness:    Christina Castaneda is a HTN, HLD, OSA, gout, GERD, COPD, hypothyroidism, B12 deficiency presented to ED with Chief feeling poorly for past 2 weeks symptoms including cough, poor p.o. intake. Reporting of generalized weaknesses.  Mild shortness of breath but no chest pain.  And low-grade fever at home.  ED evaluation: Blood pressure (!) 97/58, pulse 93, temp.97.9 F (36.6 C), temp. source Axillary, resp. rate (!) 25, weight 111.1 kg, SpO2 99%.  On 2 L of oxygen  WBC 19.7, sodium 133, CO2 17, BUN 41, Creatinine 1. 77, lactic acid 1.6 CXR:*Heterogeneous opacities in the left retrocardiac region, which may represent atelectasis versus pneumonitis.  Requested patient to be admitted likely due to pneumonia, meeting SIRS criteria with leukocytosis, hypotension, tachypnea    Patient Denies having: Fever, Chills,, Chest Pain, Abd pain, N/V/D, headache, dizziness, lightheadedness,  Dysuria, Joint pain, rash, open wounds  ED Course:   Blood pressure (!) 97/58, pulse 93, temperature 97.9 F (36.6 C), temperature source Axillary, resp. rate (!) 25, weight 111.1 kg, last menstrual period 11/18/2012, SpO2 99%. Abnormal labs;   Review of Systems: As per HPI, otherwise 10 point review of systems were negative.   ----------------------------------------------------------------------------------------------------------------------  Allergies  Allergen Reactions   Penicillins Anaphylaxis,  Hives, Shortness Of Breath and Swelling    Pt given ceftriaxone 05/27/24 in ED and tolerated   Biphosphate Other (See Comments)    Aching joints   Fluticasone Other (See Comments)    Headaches    Home MEDs:  Prior to Admission medications   Medication Sig Start Date End Date Taking? Authorizing Provider  fluticasone-salmeterol (ADVAIR) 500-50 MCG/ACT AEPB Inhale 1 puff into the lungs in the morning and at bedtime. 05/13/24  Yes [provider]  acetaminophen  (TYLENOL ) 500 MG tablet Take 1,000 mg by mouth every 6 (six) hours.    [provider]  acetaminophen  (TYLENOL ) 650 MG CR tablet Take 650 mg by mouth every 8 (eight) hours as needed for pain.    [provider]  albuterol  (PROVENTIL  HFA;VENTOLIN  HFA) 108 (90 BASE) MCG/ACT inhaler Inhale 2 puffs into the lungs every 6 (six) hours as needed. Wheezing and shortness of breath    [provider]  allopurinol  (ZYLOPRIM ) 100 MG tablet Take 1 tablet (100 mg total) by mouth daily. 05/28/16   Rawland Caddy, MD  Budeson-Glycopyrrol-Formoterol (BREZTRI  AEROSPHERE) 160-9-4.8 MCG/ACT AERO Inhale 2 puffs into the lungs in the morning and at bedtime. 01/03/23   Diamond Formica, MD  Budeson-Glycopyrrol-Formoterol (BREZTRI  AEROSPHERE) 160-9-4.8 MCG/ACT AERO Inhale 2 puffs into the lungs 2 (two) times daily. 02/13/23   Wert, Michael B, MD  ergocalciferol (VITAMIN  D2) 50000 UNITS capsule Take 50,000 Units by mouth every Saturday.    [provider]  gabapentin (NEURONTIN) 100 MG capsule Take by mouth.    [provider]  gabapentin (NEURONTIN) 300 MG capsule Take 1 capsule by mouth at bedtime. 02/27/21   [provider]  INCRUSE ELLIPTA 62.5 MCG/ACT AEPB Inhale 1 puff into the lungs daily.    [provider]  levothyroxine  (SYNTHROID ) 150 MCG tablet Take 1 tablet by mouth daily. 07/21/18   [provider]  loratadine (CLARITIN) 10 MG tablet Take 10 mg by mouth daily.    [provider]  metoprolol  tartrate (LOPRESSOR ) 100 MG tablet Take 1 tablet (100 mg total) by mouth once for 1 dose. Please take one time dose 100mg  metoprolol  tartrate 2 hr prior to cardiac CT for HR control IF HR >55bpm. 08/20/22 08/20/22  O'Neal, Cathay Clonts, MD  naproxen  (NAPROSYN ) 500 MG tablet TAKE 1 TABLET BY MOUTH with food OR MILK AS NEEDED EVERY TWELVE HOURS    [provider]  rosuvastatin (CRESTOR) 10 MG tablet Take 10 mg by mouth daily.    [provider]  senna (SENOKOT) 8.6 MG tablet Take 2 tablets by mouth at bedtime as needed for constipation.    [provider]  senna-docusate (SENOKOT-S) 8.6-50 MG tablet Take 2 tablets by mouth 3 (three) times daily before meals. 05/23/16   Love, Renay Carota, PA-C    PRN MEDs: acetaminophen  **OR** acetaminophen , bisacodyl, hydrALAZINE, HYDROmorphone  (DILAUDID ) injection, ondansetron  **OR** ondansetron  (ZOFRAN ) IV, oxyCODONE , senna-docusate, sodium phosphate , traZODone   Past Medical History:  Diagnosis Date   Asthma    mild   Atypical endometrial hyperplasia    COPD (chronic obstructive pulmonary disease) (HCC)    Goiter    Gout    Heart murmur    Hypertension    Hypothyroidism    Vitamin B12 deficiency    Yeast infection    for last 3 weeks    Past Surgical History:  Procedure Laterality Date   ABDOMINAL WALL DEFECT REPAIR N/A 04/18/2016   Procedure: CLOSURE OF ABDOMEN;  Surgeon: Sim Dryer, MD;  Location: [facility] OR;  Service: General;  Laterality: N/A;   APPENDECTOMY     APPLICATION OF WOUND VAC N/A 04/02/2016   Procedure: application of wound vac+;  Surgeon: Aldean Hummingbird, MD;  Location: [facility] OR;  Service: General;  Laterality: N/A;   CHOLECYSTECTOMY  2009   gallstone removed   COLOSTOMY N/A 03/30/2016   Procedure: COLOSTOMY;  Surgeon: Enid Harry, MD;  Location: [facility] OR;  Service: General;  Laterality: N/A;   COLOSTOMY REVISION N/A 03/28/2016   Procedure: COLOSTOMY REVISION;  Surgeon: Jerryl Morin, MD;   Location: [facility] OR;  Service: General;  Laterality: N/A;   COLOSTOMY REVISION N/A 03/30/2016   Procedure: COLON RESECTION LEFT;  Surgeon: Enid Harry, MD;  Location: [facility] OR;  Service: General;  Laterality: N/A;   LAPAROTOMY N/A 03/28/2016   Procedure: EXPLORATORY LAPAROTOMY;  Surgeon: Jerryl Morin, MD;  Location: [facility] OR;  Service: General;  Laterality: N/A;   LAPAROTOMY N/A 04/02/2016   Procedure: Re-exploration of open abdomen, application of abdominal wound vac;  Surgeon: Aldean Hummingbird, MD;  Location: [facility] OR;  Service: General;  Laterality: N/A;   LAPAROTOMY N/A 04/04/2016   Procedure: EXPLORATORY LAPAROTOMY, PLACEMENT OF ABRA ABDOMINAL WALL CLOSURE SET ;  Surgeon: Aldean Hummingbird, MD;  Location: [facility] OR;  Service: General;  Laterality: N/A;   LAPAROTOMY N/A 04/18/2016   Procedure: EXPLORATORY LAPAROTOMY;  Surgeon: Sim Dryer, MD;  Location: [facility] OR;  Service: General;  Laterality: N/A;   NECK SURGERY  1994   repair disk   OMENTECTOMY N/A 03/28/2016   Procedure: OMENTECTOMY;  Surgeon: Jerryl Morin, MD;  Location: [facility] OR;  Service: General;  Laterality: N/A;   ROBOTIC ASSISTED TOTAL HYSTERECTOMY WITH BILATERAL SALPINGO OOPHERECTOMY  11/18/2012   Procedure: ROBOTIC ASSISTED TOTAL HYSTERECTOMY WITH BILATERAL SALPINGO OOPHORECTOMY;  Surgeon: Daryel Ensign A. Clerance Dais, MD;  Location: [facility] ORS;  Service: Gynecology;  Laterality: N/A;   TONSILLECTOMY     as a child   VACUUM ASSISTED CLOSURE CHANGE N/A 03/30/2016   Procedure: ABDOMINAL VAC CHANGE;  Surgeon: Enid Harry, MD;  Location: [facility] OR;  Service: General;  Laterality: N/A;   WOUND DEBRIDEMENT N/A 03/28/2016   Procedure: DEBRIDEMENT WOUND;  Surgeon: Jerryl Morin, MD;  Location: [facility] OR;  Service: General;  Laterality: N/A;     reports that she has been smoking cigarettes. She has a 42 pack-year smoking history. She has never used smokeless tobacco. She reports current alcohol use of about 1.0 - 3.0 standard drink of alcohol per week. She reports that she does not use  drugs.   Family History  Problem Relation Age of Onset   COPD Mother    Diabetes Mother    Congestive Heart Failure Mother    Diabetes Father    Heart attack Father     Physical Exam:   Vitals:   05/27/24 1500 05/27/24 1530 05/27/24 1600 05/27/24 1600  BP: (!) 145/50 123/63 136/71   Pulse: 72 74 72   Resp: 20 19 (!) 23   Temp:    97.7 F (36.5 C)  TempSrc:    Oral  SpO2: 100% 100% 98%   Weight:       Constitutional: NAD, C/o SOB, no chest pain  Eyes: PERRL, lids and conjunctivae normal ENMT: Mucous membranes are moist. Posterior pharynx clear of any exudate or lesions.Normal dentition.  Neck: normal, supple, no masses, no thyromegaly Respiratory: clear to auscultation bilaterally, no wheezing, no crackles. Normal respiratory effort. No accessory muscle use.  Cardiovascular: Regular rate and rhythm, no murmurs / rubs / gallops. No extremity edema. 2+ pedal pulses. No carotid bruits.  Abdomen: no tenderness, no masses palpated. No hepatosplenomegaly. Bowel sounds positive.  Musculoskeletal: no clubbing / cyanosis. No joint deformity upper and lower extremities. Good ROM, no contractures. Normal muscle tone.  Neurologic: CN II-XII grossly intact. Sensation intact, DTR normal. Strength 5/5 in all 4.  Psychiatric: Normal judgment and insight. Alert and oriented x 3. Normal mood.  Skin: no rashes, lesions, ulcers. No induration      Labs on admission:    I have personally reviewed following labs and imaging studies  CBC: Recent Labs  Lab 05/27/24 1144  WBC 19.7*  NEUTROABS 16.8*  HGB 12.7  HCT 41.2  MCV 102.0*  PLT 320   Basic Metabolic Panel: Recent Labs  Lab 05/27/24 1144 05/27/24 1420  NA 133*  --   K 4.4  --   CL 107  --   CO2 17*  --   GLUCOSE 94  --   BUN 41*  --   CREATININE 1.77*  --   CALCIUM 9.6  --   MG  --  1.8  PHOS  --  2.9   GFR: Estimated Creatinine Clearance: 36.7 mL/min (A) (by C-G formula based on SCr of 1.77 mg/dL (H)). Liver  Function Tests: Recent Labs  Lab 05/27/24 1144  AST 23  ALT 17  ALKPHOS 61  BILITOT 0.4  PROT 7.4  ALBUMIN 2.7*    Recent Labs  Lab 05/27/24 1144  INR 1.3*   Urine analysis:    Component Value Date/Time   COLORURINE YELLOW 03/28/2016 2142   APPEARANCEUR CLOUDY (A) 03/28/2016 2142   LABSPEC 1.025 03/28/2016 2142   PHURINE 7.5 03/28/2016 2142   GLUCOSEU NEGATIVE 03/28/2016 2142   HGBUR NEGATIVE 03/28/2016 2142   BILIRUBINUR NEGATIVE 03/28/2016 2142   KETONESUR 15 (A) 03/28/2016 2142   PROTEINUR NEGATIVE 03/28/2016 2142   NITRITE NEGATIVE 03/28/2016 2142   LEUKOCYTESUR NEGATIVE 03/28/2016 2142    Last A1C:  No results found for: "HGBA1C"   Radiologic Exams on Admission:   DG Chest Port 1 View Result Date: 05/27/2024 CLINICAL DATA:  [id number] Sepsis (HCC) [id number]. Hypertension. Weakness. EXAM: PORTABLE CHEST 1 VIEW COMPARISON:  04/17/2016. FINDINGS: There are heterogeneous opacities in the left retrocardiac region, which may represent atelectasis versus pneumonitis. Correlate clinically. Bilateral lung fields are otherwise clear. There is subtle blunting of left lateral costophrenic angle, which may be due to overlying lung opacities versus trace pleural effusion. Right lateral costophrenic angle is clear. No pneumothorax on either side. Normal cardio-mediastinal silhouette. No acute osseous abnormalities. The soft tissues are within normal limits. IMPRESSION: *Heterogeneous opacities in the left retrocardiac region, which may represent atelectasis versus pneumonitis. Electronically Signed   By: Beula Brunswick M.D.   On: 05/27/2024 11:56    EKG:   Independently reviewed.  Orders placed or performed during the hospital encounter of 05/27/24   EKG 12-Lead   EKG 12-Lead   EKG 12-Lead   ---------------------------------------------------------------------------------------------------------------------------------------    Assessment / Plan:   Principal Problem:    Sepsis (HCC) Active Problems:   COPD  GOLD 2/AB   AKI (acute kidney injury) (HCC)   Hypothyroid   OSA (obstructive sleep apnea)   Hyperlipidemia   Obesity   Tobacco abuse   Essential hypertension   Polyarticular gout   Tracheostomy care (HCC)   Dysphagia   Debility   Generalized weakness   Chronic pain syndrome   Adjustment disorder with mixed anxiety and depressed mood   Assessment and Plan: * Sepsis (HCC) POA meeting Sepsis criteria, with tachypnea, leukocytosis, hypotension, hypoxia with AKI - Likely source possibly Pneumonia - WBC 19.7, lactic acid 1.6 - Procalcitonin 0.32   _ Cont with IVF, 2Ls of LR in ED  - Patient has been given IV azithromycin, and Rocephin -due to penicillin allergy patient will be started on IV Levaquin -Will follow-up with blood cultures will try to obtain sputum cultures  AKI (acute kidney injury) (HCC) - Likely due to SIRS, infection hypovolemia hypotension -Continue IV fluids, avoid nephrotoxins -Monitor BUN/creatinine closely  Lab Results  Component Value Date   CREATININE 1.77 (H) 05/27/2024   CREATININE 1.08 (H) 02/19/2024   CREATININE 1.02 (H) 04/14/2021     COPD  GOLD 2/AB - 2 L of oxygen, satting 90% -Heart rate closely, as needed DuoNeb bronchodilators, Reviewing home inhalers will resume according  Hyperlipidemia Continue low-dose statin  OSA (obstructive sleep apnea) - Stable 2 L oxygen  Hypothyroid - Home dose Synthroid , checking TSH  Polyarticular gout - Daily as needed analgesics, continue home allopurinol  Or holding naproxen   Essential hypertension - Hypotensive on arrival, with holding home BP meds -Holding metoprolol , and Benicar  -BP closely  Tobacco abuse - Discussed regarding smoking cessation -Provided  Obesity Body mass index is 40.77 kg/m. - Cholesterol weight loss program, follow-up with -Dietary modification increase activity  Adjustment disorder with mixed  anxiety and depressed mood -  -Currently stable, monitor closely, as needed  Generalized weakness - Consulting PT OT, fall precautions  Debility - Consulting PT OT for eval -Fall precautions  Dysphagia - Bedside swallow evaluation, will consider speech eval      Consults called: TOC/PT/OT -------------------------------------------------------------------------------------------------------------------------------------------- DVT prophylaxis:  heparin injection 5,000 Units Start: 05/27/24 2200 TED hose Start: 05/27/24 1357 SCDs Start: 05/27/24 1357   Code Status:   Code Status: Full Code   Admission status: Patient will be admitted as Inpatient, with a greater than 2 midnight length of stay. Level of care: Telemetry   Family Communication:  none at bedside  (The above findings and plan of care has been discussed with patient in detail, the patient expressed understanding and agreement of above plan)  --------------------------------------------------------------------------------------------------------------------------------------------------  Disposition Plan:  Anticipated 1-2 days Status is: Inpatient Remains inpatient appropriate because: Needing IV antibiotics, respiratory support     ----------------------------------------------------------------------------------------------------------------------------------------------------  Time spent:  72  Min.  Was spent seeing and evaluating the patient, reviewing all medical records, drawn plan of care.  SIGNED: Bobbetta Burnet, MD, FHM. FAAFP. [place] [facility], Pager  (Please use [email or web address] to page/ or secure chat through epic) If 7PM-7AM, please contact night-coverage [email or web address],  05/27/2024, 4:38 PM

## 2024-05-27 NOTE — Assessment & Plan Note (Deleted)
 POA meeting SIRS criteria, with tachypnea, leukocytosis, hypotension Likely source possibly pneumonia - WBC 19.7, lactic acid 1.6 - Procalcitonin  - Patient has been given IV azithromycin, and Rocephin -due to penicillin allergy patient will be started on IV Levaquin -Will follow-up with blood cultures will try to obtain sputum cultures

## 2024-05-27 NOTE — Assessment & Plan Note (Signed)
-   Likely due to SIRS, infection hypovolemia hypotension -Continue IV fluids, avoid nephrotoxins -Monitor BUN/creatinine closely  Lab Results  Component Value Date   CREATININE 1.77 (H) 05/27/2024   CREATININE 1.08 (H) 02/19/2024   CREATININE 1.02 (H) 04/14/2021

## 2024-05-27 NOTE — Assessment & Plan Note (Addendum)
 POA meeting Sepsis criteria, with tachypnea, leukocytosis, hypotension, hypoxia with AKI - Likely source possibly Pneumonia - WBC 19.7, lactic acid 1.6 - Procalcitonin 0.32   _ Cont with IVF, 2Ls of LR in ED  - Patient has been given IV azithromycin, and Rocephin -due to penicillin allergy patient will be started on IV Levaquin -Will follow-up with blood cultures will try to obtain sputum cultures

## 2024-05-27 NOTE — Assessment & Plan Note (Signed)
-   Bedside swallow evaluation, will consider speech eval

## 2024-05-27 NOTE — ED Notes (Signed)
 Pt cannot ambulate, unable to perform ambulatory sat

## 2024-05-27 NOTE — Hospital Course (Signed)
 Christina Castaneda is a HTN, HLD, OSA, gout, GERD, COPD, hypothyroidism, B12 deficiency presented to ED with Chief feeling poorly for past 2 weeks symptoms including cough, poor p.o. intake. Reporting of generalized weaknesses.  Mild shortness of breath but no chest pain.  And low-grade fever at home.  ED evaluation: Blood pressure (!) 97/58, pulse 93, temp.97.9 F (36.6 C), temp. source Axillary, resp. rate (!) 25, weight 111.1 kg, SpO2 99%.  On 2 L of oxygen  WBC 19.7, sodium 133, CO2 17, BUN 41, Creatinine 1. 77, lactic acid 1.6 CXR:*Heterogeneous opacities in the left retrocardiac region, which may represent atelectasis versus pneumonitis.  Requested patient to be admitted likely due to pneumonia, meeting SIRS criteria with leukocytosis, hypotension, tachypnea

## 2024-05-28 DIAGNOSIS — R6520 Severe sepsis without septic shock: Secondary | ICD-10-CM | POA: Diagnosis not present

## 2024-05-28 DIAGNOSIS — J9600 Acute respiratory failure, unspecified whether with hypoxia or hypercapnia: Secondary | ICD-10-CM

## 2024-05-28 DIAGNOSIS — A419 Sepsis, unspecified organism: Secondary | ICD-10-CM

## 2024-05-28 LAB — BASIC METABOLIC PANEL WITH GFR
Anion gap: 7 (ref 5–15)
BUN: 34 mg/dL — ABNORMAL HIGH (ref 8–23)
CO2: 21 mmol/L — ABNORMAL LOW (ref 22–32)
Calcium: 8.8 mg/dL — ABNORMAL LOW (ref 8.9–10.3)
Chloride: 108 mmol/L (ref 98–111)
Creatinine, Ser: 1.1 mg/dL — ABNORMAL HIGH (ref 0.44–1.00)
GFR, Estimated: 54 mL/min — ABNORMAL LOW (ref >60)
Glucose, Bld: 74 mg/dL (ref 70–99)
Potassium: 4.1 mmol/L (ref 3.5–5.1)
Sodium: 136 mmol/L (ref 135–145)

## 2024-05-28 LAB — CBC
HCT: 36.1 % (ref 36.0–46.0)
Hemoglobin: 11.2 g/dL — ABNORMAL LOW (ref 12.0–15.0)
MCH: 32.7 pg (ref 26.0–34.0)
MCHC: 31 g/dL (ref 30.0–36.0)
MCV: 105.2 fL — ABNORMAL HIGH (ref 80.0–100.0)
Platelets: 275 10*3/uL (ref 150–400)
RBC: 3.43 MIL/uL — ABNORMAL LOW (ref 3.87–5.11)
RDW: 14.7 % (ref 11.5–15.5)
WBC: 12.4 10*3/uL — ABNORMAL HIGH (ref 4.0–10.5)
nRBC: 0 % (ref 0.0–0.2)

## 2024-05-28 LAB — APTT: aPTT: 30 s (ref 24–36)

## 2024-05-28 MED ORDER — IPRATROPIUM BROMIDE 0.02 % IN SOLN
0.5000 mg | Freq: Two times a day (BID) | RESPIRATORY_TRACT | Status: DC
  Administered 2024-05-28 – 2024-05-31 (×6): 0.5 mg via RESPIRATORY_TRACT
  Filled 2024-05-28 (×7): qty 2.5

## 2024-05-28 MED ORDER — LEVALBUTEROL HCL 0.63 MG/3ML IN NEBU
0.6300 mg | INHALATION_SOLUTION | Freq: Two times a day (BID) | RESPIRATORY_TRACT | Status: DC
  Administered 2024-05-28 – 2024-05-31 (×6): 0.63 mg via RESPIRATORY_TRACT
  Filled 2024-05-28 (×7): qty 3

## 2024-05-28 NOTE — Plan of Care (Signed)
  Problem: Education: Goal: Knowledge of General Education information will improve Description: Including pain rating scale, medication(s)/side effects and non-pharmacologic comfort measures Outcome: Progressing   Problem: Clinical Measurements: Goal: Ability to maintain clinical measurements within normal limits will improve Outcome: Progressing   Problem: Activity: Goal: Risk for activity intolerance will decrease Outcome: Progressing   Problem: Safety: Goal: Ability to remain free from injury will improve Outcome: Progressing   Problem: Skin Integrity: Goal: Risk for impaired skin integrity will decrease Outcome: Progressing   Problem: Fluid Volume: Goal: Hemodynamic stability will improve Outcome: Progressing   Problem: Clinical Measurements: Goal: Diagnostic test results will improve Outcome: Progressing Goal: Signs and symptoms of infection will decrease Outcome: Progressing   Problem: Respiratory: Goal: Ability to maintain adequate ventilation will improve Outcome: Progressing   Problem: Activity: Goal: Ability to tolerate increased activity will improve Outcome: Progressing   Problem: Clinical Measurements: Goal: Ability to maintain a body temperature in the normal range will improve Outcome: Progressing   Problem: Respiratory: Goal: Ability to maintain adequate ventilation will improve Outcome: Progressing Goal: Ability to maintain a clear airway will improve Outcome: Progressing

## 2024-05-28 NOTE — Plan of Care (Signed)
  Problem: Acute Rehab PT Goals(only PT should resolve) Goal: Pt Will Go Supine/Side To Sit Outcome: Progressing Flowsheets (Taken 05/28/2024 1510) Pt will go Supine/Side to Sit:  with moderate assist  with minimal assist Goal: Patient Will Transfer Sit To/From Stand Outcome: Progressing Flowsheets (Taken 05/28/2024 1510) Patient will transfer sit to/from stand:  with minimal assist  with moderate assist Goal: Pt Will Transfer Bed To Chair/Chair To Bed Outcome: Progressing Flowsheets (Taken 05/28/2024 1510) Pt will Transfer Bed to Chair/Chair to Bed:  with min assist  with mod assist Goal: Pt Will Ambulate Outcome: Progressing Flowsheets (Taken 05/28/2024 1510) Pt will Ambulate:  with minimal assist  with moderate assist  with rolling walker  25 feet   3:11 PM, 05/28/24 Walton Guppy, MPT Physical Therapist with [facility] [phone] office [phone] mobile phone

## 2024-05-28 NOTE — TOC Initial Note (Signed)
 Transition of Care [facility]) - Initial/Assessment Note    Patient Details  Name: Christina Castaneda MRN: [id number] Date of Birth: Jul 22, 1956  [facility] CM/SW Contact:    Grandville Lax, LCSWA Phone Number: 05/28/2024, 1:02 PM  Clinical Narrative:                 CSW updated that PT is recommending SNF for PT. CSW spoke with pt and family at bedside to review. Pt and family are agreeable for SNF referral being sent to both [place] and [place] facilities for review. CSW to complete referral and send out for review. TOC to follow.   Expected Discharge Plan: Skilled Nursing Facility Barriers to Discharge: Continued Medical Work up   Patient Goals and CMS Choice Patient states their goals for this hospitalization and ongoing recovery are:: get better CMS Medicare.gov Compare Post Acute Care list provided to:: Patient Choice offered to / list presented to : Patient      Expected Discharge Plan and Services In-house Referral: Clinical Social Work Discharge Planning Services: CM Consult Post Acute Care Choice: Skilled Nursing Facility Living arrangements for the past 2 months: Single Family Home                                      Prior Living Arrangements/Services Living arrangements for the past 2 months: Single Family Home Lives with:: Self Patient language and need for interpreter reviewed:: Yes Do you feel safe going back to the place where you live?: Yes      Need for Family Participation in Patient Care: Yes (Comment) Care giver support system in place?: Yes (comment)   Criminal Activity/Legal Involvement Pertinent to Current Situation/Hospitalization: No - Comment as needed  Activities of Daily Living   ADL Screening (condition at time of admission) Independently performs ADLs?: Yes (appropriate for developmental age) Is the patient deaf or have difficulty hearing?: No Does the patient have difficulty seeing, even when wearing glasses/contacts?:  No Does the patient have difficulty concentrating, remembering, or making decisions?: No  Permission Sought/Granted                  Emotional Assessment Appearance:: Appears stated age Attitude/Demeanor/Rapport: Engaged Affect (typically observed): Accepting Orientation: : Oriented to Self, Oriented to Place, Oriented to  Time, Oriented to Situation Alcohol / Substance Use: Not Applicable Psych Involvement: No (comment)  Admission diagnosis:  SIRS (systemic inflammatory response syndrome) (HCC) [R65.10] Acute kidney injury (HCC) [N17.9] Community acquired pneumonia of left lung, unspecified part of lung [J18.9] Patient Active Problem List   Diagnosis Date Noted   AKI (acute kidney injury) (HCC) 05/27/2024    Class: Acute   Hyperlipidemia 05/27/2024   Sepsis (HCC) 05/27/2024   Cigarette smoker 01/04/2023   Chronic venous insufficiency 02/21/2017   Lymphedema 02/21/2017   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   Adjustment disorder with mixed anxiety and depressed mood    Debility 05/02/2016   Edema    Generalized weakness    Chronic pain syndrome    Hypoalbuminemia due to protein-calorie malnutrition (HCC)    OSA (obstructive sleep apnea)    Morbid obesity due to excess calories (HCC)    S/P colostomy (HCC)    Tracheostomy status (HCC)    Tracheostomy care (HCC)    COPD  GOLD 2/AB    Tobacco abuse    Essential hypertension    Bilateral knee pain    Dysphagia  Acute blood loss anemia    Hyponatremia    Thyroid  activity decreased    Acute respiratory failure (HCC)    Pressure ulcer 04/01/2016   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 - [place] - [address] [place] [address] Phone: [phone] Fax:  [phone]     Social Drivers of Health (SDOH) Social History: SDOH Screenings   Food Insecurity: No Food Insecurity (05/27/2024)  Housing: Low Risk  (05/27/2024)  Transportation Needs: No Transportation Needs (05/27/2024)  Utilities: Not At Risk (05/27/2024)  Social Connections: Moderately Isolated (05/27/2024)  Tobacco Use: High Risk (05/27/2024)   SDOH Interventions:     Readmission Risk Interventions    05/27/2024   10:01 PM  Readmission Risk Prevention Plan  Post Dischage Appt Complete  Medication Screening Complete  Transportation Screening Complete

## 2024-05-28 NOTE — Evaluation (Signed)
 Occupational Therapy Evaluation Patient Details Name: Christina Castaneda MRN: [id number] DOB: 1956/10/22 Today's Date: 05/28/2024   History of Present Illness   Christina Castaneda is a HTN, HLD, OSA, gout, GERD, COPD, hypothyroidism, B12 deficiency presented to ED with  Chief feeling poorly for past 2 weeks symptoms including cough, poor p.o. intake.  Reporting of generalized weaknesses.  Mild shortness of breath but no chest pain.  And low-grade fever at home. (per MD)     Clinical Impressions Pt agreeable to OT and PT co-evaluation. Pt up at EOB with PT when this OT entered the room. Husband present and assisting in encouraging pt for mobility. Max A for EOB to chair tranfers with RW. Pt unable to complete lower body ADL's at this time. Very limited B shoulder A/ROM. Able to grasp RW to assist for transfer. Pt left in the chair with call bell within reach and family present. Pt will benefit from continued OT in the hospital and recommended venue below to increase strength, balance, and endurance for safe ADL's.        If plan is discharge home, recommend the following:   A lot of help with walking and/or transfers;A lot of help with bathing/dressing/bathroom;Assistance with cooking/housework;Assist for transportation;Help with stairs or ramp for entrance     Functional Status Assessment   Patient has had a recent decline in their functional status and demonstrates the ability to make significant improvements in function in a reasonable and predictable amount of time.     Equipment Recommendations   None recommended by OT             Precautions/Restrictions   Precautions Precautions: Fall Recall of Precautions/Restrictions: Intact Restrictions Weight Bearing Restrictions Per Provider Order: No     Mobility Bed Mobility               General bed mobility comments: Patient upright at EOB with PT when this therapist entered the room.    Transfers Overall  transfer level: Needs assistance Equipment used: Rolling walker (2 wheels) Transfers: Sit to/from Stand, Bed to chair/wheelchair/BSC Sit to Stand: Max assist     Step pivot transfers: Max assist     General transfer comment: Much assist to boost from EOB; slow labored movement.      Balance Overall balance assessment: Needs assistance Sitting-balance support: No upper extremity supported, Feet supported Sitting balance-Leahy Scale: Fair Sitting balance - Comments: fair to good seated at EOB   Standing balance support: Bilateral upper extremity supported, During functional activity, Reliant on assistive device for balance Standing balance-Leahy Scale: Poor Standing balance comment: using RW                           ADL either performed or assessed with clinical judgement   ADL Overall ADL's : Needs assistance/impaired     Grooming: Minimal assistance;Sitting;Set up   Upper Body Bathing: Minimal assistance;Moderate assistance;Sitting   Lower Body Bathing: Maximal assistance;Total assistance;Sitting/lateral leans   Upper Body Dressing : Minimal assistance;Moderate assistance;Sitting   Lower Body Dressing: Maximal assistance;Total assistance;Bed level;Sitting/lateral leans   Toilet Transfer: Maximal assistance;Rolling walker (2 wheels);Stand-pivot Statistician Details (indicate cue type and reason): simulated via EOB to chair transfer Toileting- Clothing Manipulation and Hygiene: Maximal assistance;Sitting/lateral lean               Vision Baseline Vision/History: 1 Wears glasses (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) Ability to  See in Adequate Light: 1 Impaired Patient Visual Report: No change from baseline Vision Assessment?: No apparent visual deficits     Perception Perception: Not tested       Praxis Praxis: Not tested       Pertinent Vitals/Pain Pain Assessment Pain Assessment: 0-10 Pain Score: 10-Worst  pain ever Pain Location: "all over"; L knee Pain Descriptors / Indicators: Discomfort Pain Intervention(s): Monitored during session, Repositioned, Limited activity within patient's tolerance     Extremity/Trunk Assessment Upper Extremity Assessment Upper Extremity Assessment: Generalized weakness (2+/5 bilateral shoulder flexion; generally weak otherwise.)   Lower Extremity Assessment Lower Extremity Assessment: Defer to PT evaluation   Cervical / Trunk Assessment Cervical / Trunk Assessment: Kyphotic   Communication Communication Communication: No apparent difficulties   Cognition Arousal: Alert Behavior During Therapy: WFL for tasks assessed/performed Cognition: No apparent impairments                               Following commands: Intact       Cueing  General Comments   Cueing Techniques: Verbal cues                 Home Living Family/patient expects to be discharged to:: Private residence Living Arrangements: Spouse/significant other Available Help at Discharge: Family;Available PRN/intermittently Type of Home: House Home Access: Ramped entrance     Home Layout: One level     Bathroom Shower/Tub: Sponge bathes at baseline;Tub/shower unit   Bathroom Toilet: Handicapped height (BSC over toilet) Bathroom Accessibility: Yes How Accessible: Accessible via walker Home Equipment: Rolling Walker (2 wheels);Cane - single point;Shower seat;BSC/3in1          Prior Functioning/Environment Prior Level of Function : Independent/Modified Independent             Mobility Comments: SPC and RW for household ambulation ADLs Comments: Pt reports independence at baseline; drives; completes IADL's    OT Problem List: Decreased strength;Decreased range of motion;Decreased activity tolerance;Impaired balance (sitting and/or standing);Obesity;Pain;Impaired UE functional use   OT Treatment/Interventions: Self-care/ADL training;Therapeutic  exercise;DME and/or AE instruction;Energy conservation;Therapeutic activities;Patient/family education;Balance training      OT Goals(Current goals can be found in the care plan section)   Acute Rehab OT Goals Patient Stated Goal: improve function OT Goal Formulation: With patient Time For Goal Achievement: 06/11/24 Potential to Achieve Goals: Good   OT Frequency:  Min 2X/week    Co-evaluation PT/OT/SLP Co-Evaluation/Treatment: Yes Reason for Co-Treatment: To address functional/ADL transfers   OT goals addressed during session: ADL's and self-care                       End of Session Equipment Utilized During Treatment: Rolling walker (2 wheels);Gait belt  Activity Tolerance: Patient tolerated treatment well Patient left: in chair;with call bell/phone within reach;with family/visitor present  OT Visit Diagnosis: Unsteadiness on feet (R26.81);Other abnormalities of gait and mobility (R26.89);Muscle weakness (generalized) (M62.81);Pain Pain - part of body:  (all over)                Time: 4098-1191 OT Time Calculation (min): 18 min Charges:  OT General Charges $OT Visit: 1 Visit OT Evaluation $OT Eval Low Complexity: 1 Low  Roma Bondar OT, MOT  Thurnell Floss 05/28/2024, 1:30 PM

## 2024-05-28 NOTE — Evaluation (Signed)
 Physical Therapy Evaluation Patient Details Name: NAZIYAH TIESZEN MRN: [id number] DOB: October 30, 1956 Today's Date: 05/28/2024  History of Present Illness  Nickey Canedo Caillier is a HTN, HLD, OSA, gout, GERD, COPD, hypothyroidism, B12 deficiency presented to ED with  Chief feeling poorly for past 2 weeks symptoms including cough, poor p.o. intake.  Reporting of generalized weaknesses.  Mild shortness of breath but no chest pain.  And low-grade fever at home.   Clinical Impression  Patient requires much encouragement for getting up and  demonstrates slow labored movement for sitting up at bedside, once seated had to take rest break due to c/o nausea and later able to stand and limited to a few side steps before having to sit due to weakness and poor standing balance. Patient tolerated sitting up in chair after therapy. Patient will benefit from continued skilled physical therapy in hospital and recommended venue below to increase strength, balance, endurance for safe ADLs and gait.           If plan is discharge home, recommend the following: A lot of help with bathing/dressing/bathroom;A lot of help with walking and/or transfers;Help with stairs or ramp for entrance;Assistance with cooking/housework   Can travel by private vehicle   No    Equipment Recommendations None recommended by PT  Recommendations for Other Services       Functional Status Assessment Patient has had a recent decline in their functional status and demonstrates the ability to make significant improvements in function in a reasonable and predictable amount of time.     Precautions / Restrictions Precautions Precautions: Fall Recall of Precautions/Restrictions: Intact Restrictions Weight Bearing Restrictions Per Provider Order: No      Mobility  Bed Mobility Overal bed mobility: Needs Assistance Bed Mobility: Supine to Sit     Supine to sit: Mod assist, Max assist     General bed mobility comments: increased time  with frequent rest breaks, verbal/tactile cueing    Transfers Overall transfer level: Needs assistance Equipment used: Rolling walker (2 wheels) Transfers: Sit to/from Stand, Bed to chair/wheelchair/BSC Sit to Stand: Max assist   Step pivot transfers: Max assist       General transfer comment: unsteady labored movement requiring bed elevated    Ambulation/Gait Ambulation/Gait assistance: Max assist Gait Distance (Feet): 4 Feet Assistive device: Rolling walker (2 wheels) Gait Pattern/deviations: Decreased step length - right, Decreased step length - left, Decreased stride length, Trunk flexed Gait velocity: slow     General Gait Details: limited to a few slow labored unsteady side steps before having to sit due to c/o fatigue  Stairs            Wheelchair Mobility     Tilt Bed    Modified Rankin (Stroke Patients Only)       Balance Overall balance assessment: Needs assistance Sitting-balance support: Feet supported, No upper extremity supported Sitting balance-Leahy Scale: Fair Sitting balance - Comments: fair to good seated at EOB   Standing balance support: Bilateral upper extremity supported, During functional activity, Reliant on assistive device for balance Standing balance-Leahy Scale: Poor Standing balance comment: using RW                             Pertinent Vitals/Pain Pain Assessment Pain Assessment: 0-10 Pain Score: 9  Pain Location: "all over"; L knee, especially with movement of legs Pain Descriptors / Indicators: Grimacing, Discomfort, Sore Pain Intervention(s): Limited activity within patient's tolerance, Monitored during  session, Premedicated before session, Repositioned    Home Living Family/patient expects to be discharged to:: Private residence Living Arrangements: Spouse/significant other Available Help at Discharge: Family;Available PRN/intermittently Type of Home: House Home Access: Ramped entrance       Home  Layout: One level Home Equipment: Agricultural consultant (2 wheels);Cane - single point;Shower seat;BSC/3in1      Prior Function Prior Level of Function : Independent/Modified Independent             Mobility Comments: SPC and RW for household ambulation ADLs Comments: Pt reports independence at baseline; drives; completes IADL's     Extremity/Trunk Assessment   Upper Extremity Assessment Upper Extremity Assessment: Defer to OT evaluation    Lower Extremity Assessment Lower Extremity Assessment: Generalized weakness    Cervical / Trunk Assessment Cervical / Trunk Assessment: Kyphotic  Communication   Communication Communication: No apparent difficulties    Cognition Arousal: Alert Behavior During Therapy: WFL for tasks assessed/performed   PT - Cognitive impairments: No apparent impairments                         Following commands: Intact       Cueing Cueing Techniques: Verbal cues     General Comments      Exercises     Assessment/Plan    PT Assessment Patient needs continued PT services  PT Problem List Decreased strength;Decreased activity tolerance;Decreased balance;Decreased mobility;Pain       PT Treatment Interventions DME instruction;Gait training;Stair training;Functional mobility training;Therapeutic activities;Therapeutic exercise;Balance training;Patient/family education    PT Goals (Current goals can be found in the Care Plan section)  Acute Rehab PT Goals Patient Stated Goal: return home after rehab PT Goal Formulation: With patient/family Time For Goal Achievement: 06/11/24 Potential to Achieve Goals: Good    Frequency Min 3X/week     Co-evaluation PT/OT/SLP Co-Evaluation/Treatment: Yes Reason for Co-Treatment: To address functional/ADL transfers PT goals addressed during session: Mobility/safety with mobility;Balance;Proper use of DME OT goals addressed during session: ADL's and self-care       AM-PAC PT "6 Clicks"  Mobility  Outcome Measure Help needed turning from your back to your side while in a flat bed without using bedrails?: A Lot Help needed moving from lying on your back to sitting on the side of a flat bed without using bedrails?: A Lot Help needed moving to and from a bed to a chair (including a wheelchair)?: A Lot Help needed standing up from a chair using your arms (e.g., wheelchair or bedside chair)?: A Lot Help needed to walk in hospital room?: A Lot Help needed climbing 3-5 steps with a railing? : Total 6 Click Score: 11    End of Session   Activity Tolerance: Patient tolerated treatment well;Patient limited by fatigue;Patient limited by pain Patient left: in chair;with call bell/phone within reach;with family/visitor present Nurse Communication: Mobility status PT Visit Diagnosis: Unsteadiness on feet (R26.81);Other abnormalities of gait and mobility (R26.89);Muscle weakness (generalized) (M62.81)    Time: 2130-8657 PT Time Calculation (min) (ACUTE ONLY): 33 min   Charges:   PT Evaluation $PT Eval Moderate Complexity: 1 Mod PT Treatments $Therapeutic Activity: 23-37 mins PT General Charges $$ ACUTE PT VISIT: 1 Visit         3:10 PM, 05/28/24 Walton Guppy, MPT Physical Therapist with [facility] [phone] office [phone] mobile phone

## 2024-05-28 NOTE — Plan of Care (Signed)
  Problem: Acute Rehab OT Goals (only OT should resolve) Goal: Pt. Will Perform Grooming Flowsheets (Taken 05/28/2024 1337) Pt Will Perform Grooming:  with modified independence  sitting Goal: Pt. Will Perform Upper Body Dressing Flowsheets (Taken 05/28/2024 1337) Pt Will Perform Upper Body Dressing: with modified independence Goal: Pt. Will Perform Lower Body Dressing Flowsheets (Taken 05/28/2024 1337) Pt Will Perform Lower Body Dressing: with modified independence Goal: Pt. Will Transfer To Toilet Flowsheets (Taken 05/28/2024 1337) Pt Will Transfer to Toilet:  with min assist  with contact guard assist  stand pivot transfer Goal: Pt. Will Perform Toileting-Clothing Manipulation Flowsheets (Taken 05/28/2024 1337) Pt Will Perform Toileting - Clothing Manipulation and hygiene: with modified independence Goal: Pt/Caregiver Will Perform Home Exercise Program Flowsheets (Taken 05/28/2024 1337) Pt/caregiver will Perform Home Exercise Program:  Increased strength  Increased ROM  Independently  Both right and left upper extremity  Christina Castaneda OT, MOT

## 2024-05-28 NOTE — Progress Notes (Signed)
 OT Cancellation Note  Patient Details Name: Christina Castaneda MRN: [id number] DOB: [birth year]   Cancelled Treatment:    Reason Eval/Treat Not Completed: Pain limiting ability to participate;Other (comment). Pt declined therapy evaluation today due to pain. Educated on need for mobility and to determine next venue of care but pt continued to decline. Will attempt evaluation latera as time permits.   Aldine Chakraborty OT, MOT   Thurnell Floss 05/28/2024, 9:12 AM

## 2024-05-28 NOTE — Progress Notes (Signed)
   05/28/24 2237  BiPAP/CPAP/SIPAP  BiPAP/CPAP/SIPAP Pt Type Adult  BiPAP/CPAP/SIPAP DREAMSTATIOND  Mask Type Nasal mask  Dentures removed? Not applicable  Mask Size Small  IPAP 17 cmH20  EPAP 4 cmH2O  Flow Rate 2 lpm  Patient Home Machine No  Patient Home Mask No  Patient Home Tubing No  Auto Titrate Yes  Minimum cmH2O 4 cmH2O  Maximum cmH2O 10 cmH2O  Device Plugged into RED Power Outlet Yes  BiPAP/CPAP /SiPAP Vitals  Pulse Rate 81  Resp 17  SpO2 97 %  Bilateral Breath Sounds Coarse crackles;Diminished  MEWS Score/Color  MEWS Score 0  MEWS Score Color Marrie Sizer

## 2024-05-28 NOTE — Progress Notes (Signed)
 PROGRESS NOTE    Patient: Christina Castaneda                            PCP: Malka Sea, DO                    DOB: 1956-10-26            DOA: 05/27/2024 ZOX:[id number]             DOS: 05/28/2024, 12:17 PM   LOS: 1 day   Date of Service: The patient was seen and examined on 05/28/2024  Subjective:   The patient was seen and examined this morning. Lethargic, complains of generalized weakness is asymptomatic, mildly hypertensive otherwise Hemodynamically stable. Satting 95% on 4 L oxygen (not O2 dependent baseline)   Brief Narrative:   Bryonna Sundby Lanuza is a HTN, HLD, OSA, gout, GERD, COPD, hypothyroidism, B12 deficiency presented to ED with Chief feeling poorly for past 2 weeks symptoms including cough, poor p.o. intake. Reporting of generalized weaknesses.  Mild shortness of breath but no chest pain.  And low-grade fever at home.  ED evaluation: Blood pressure (!) 97/58, pulse 93, temp.97.9 F (36.6 C), temp. source Axillary, resp. rate (!) 25, weight 111.1 kg, SpO2 99%.  On 2 L of oxygen  WBC 19.7, sodium 133, CO2 17, BUN 41, Creatinine 1. 77, lactic acid 1.6 CXR:*Heterogeneous opacities in the left retrocardiac region, which may represent atelectasis versus pneumonitis.  Requested patient to be admitted likely due to pneumonia, meeting SIRS criteria with leukocytosis, hypotension, tachypnea    Assessment & Plan:   Principal Problem:   Sepsis (HCC) Active Problems:   COPD  GOLD 2/AB   AKI (acute kidney injury) (HCC)   Hypothyroid   OSA (obstructive sleep apnea)   Hyperlipidemia   Obesity   Tobacco abuse   Essential hypertension   Polyarticular gout   Tracheostomy care (HCC)   Dysphagia   Debility   Generalized weakness   Chronic pain syndrome   Adjustment disorder with mixed anxiety and depressed mood     Assessment and Plan: * Sepsis (HCC) -Improving sepsis physiology, mildly, satting 95% on 4 L of oxygen (Q dependent at baseline)   POA meeting  Sepsis criteria, with tachypnea, leukocytosis, hypotension, hypoxia with AKI - Likely source possibly Pneumonia - WBC 19.7, lactic acid 1.6 >> 1.4, 1.0, - Procalcitonin 0.32  >>   _ Cont with IVF, 2Ls plus maintenance fluids,-discontinued  - Continue current antibiotics of IV azithromycin and Rocephin -Will follow-up with blood cultures will try to obtain sputum cultures  AKI (acute kidney injury) (HCC) -Much improved - Likely due to SIRS, infection hypovolemia hypotension - Reducing then discontinuing IVF - avoid nephrotoxins, avoid hypotension -Monitor BUN/creatinine closely Lab Results  Component Value Date   CREATININE 1.10 (H) 05/28/2024   CREATININE 1.77 (H) 05/27/2024   CREATININE 1.08 (H) 02/19/2024       COPD  GOLD 2/AB - 2 L of oxygen, satting 90%>> currently requiring 4 L of oxygen, satting 95% -Heart rate closely, as needed DuoNeb bronchodilators, Reviewing home inhalers will resume according  Hyperlipidemia Continue low-dose statin  OSA (obstructive sleep apnea) - Continue CPAP nightly  Hypothyroid - Home dose Synthroid , checking TSH  Polyarticular gout - Daily as needed analgesics, continue home allopurinol  Or holding naproxen   Essential hypertension -BP still soft - Hypotensive on arrival, with holding home BP meds -Holding metoprolol , and Benicar  -BP closely  Tobacco abuse - Discussed regarding smoking cessation -Provided  Obesity Body mass index is 40.77 kg/m. - Cholesterol weight loss program, follow-up with -Dietary modification increase activity  Adjustment disorder with mixed anxiety and depressed mood - -Currently stable, monitor closely, as needed  Generalized weakness - Consulting PT OT, fall precautions  Debility - Consulting PT OT for eval -Fall precautions  Dysphagia - Bedside swallow evaluation, will consider speech  eval            ----------------------------------------------------------------------------------------------------------------------------------------------- Nutritional status:  The patient's BMI is: Body mass index is 38.78 kg/m. I agree with the assessment and plan as outlined below: Nutrition Status:           Skin Assessment: I have examined the patient's skin and I agree with the wound assessment as performed by wound care team As outlined belowe: Pressure Injury 05/27/24 Sacrum Stage 2 -  Partial thickness loss of dermis presenting as a shallow open injury with a red, pink wound bed without slough. 4 small stage II present, each measures 0.4x 0.4 x 0.2 (Active)  05/27/24 1815  Location: Sacrum  Location Orientation:   Staging: Stage 2 -  Partial thickness loss of dermis presenting as a shallow open injury with a red, pink wound bed without slough.  Wound Description (Comments): 4 small stage II present, each measures 0.4x 0.4 x 0.2  Present on Admission:      ---------------------------------------------------------------------------------------------------------------------------------------------------- Cultures; Blood Cultures x 2 >>  Urine Culture  >>>   Sputum Culture >>    ------------------------------------------------------------------------------------------------------------------------------------------------  DVT prophylaxis:  heparin injection 5,000 Units Start: 05/27/24 2200 TED hose Start: 05/27/24 1357 SCDs Start: 05/27/24 1357   Code Status:   Code Status: Full Code  Family Communication: No family member present at bedside- -Advance care planning has been discussed.   Admission status:   Status is: Inpatient Remains inpatient appropriate because: Meeting sepsis criteria with IV antibiotics   Disposition: From  - home             Planning for discharge in 1-2 days: to   Procedures:   No admission procedures for hospital  encounter.   Antimicrobials:  Anti-infectives (From admission, onward)    Start     Dose/Rate Route Frequency Ordered Stop   05/28/24 1300  azithromycin (ZITHROMAX) 500 mg in sodium chloride  0.9 % 250 mL IVPB        500 mg 250 mL/hr over 60 Minutes Intravenous Every 24 hours 05/27/24 1441 06/01/24 1259   05/28/24 1200  cefTRIAXone (ROCEPHIN) 1 g in sodium chloride  0.9 % 100 mL IVPB        1 g 200 mL/hr over 30 Minutes Intravenous Every 24 hours 05/27/24 1441 06/01/24 1159   05/27/24 1400  levofloxacin (LEVAQUIN) IVPB 750 mg  Status:  Discontinued        750 mg 100 mL/hr over 90 Minutes Intravenous Every 24 hours 05/27/24 1359 05/27/24 1441   05/27/24 1230  cefTRIAXone (ROCEPHIN) 2 g in sodium chloride  0.9 % 100 mL IVPB        2 g 200 mL/hr over 30 Minutes Intravenous Once 05/27/24 1215 05/27/24 1249   05/27/24 1230  azithromycin (ZITHROMAX) 500 mg in sodium chloride  0.9 % 250 mL IVPB        500 mg 250 mL/hr over 60 Minutes Intravenous  Once 05/27/24 1215 05/27/24 1404        Medication:   allopurinol   100 mg Oral Daily   cholecalciferol  2,000 Units Oral Daily  fluticasone furoate-vilanterol  1 puff Inhalation QHS   heparin  5,000 Units Subcutaneous Q8H   ipratropium  0.5 mg Nebulization BID   levalbuterol  0.63 mg Nebulization BID   levothyroxine   150 mcg Oral Daily   nystatin   Topical TID   rosuvastatin  10 mg Oral Daily   senna-docusate  2 tablet Oral TID AC   sodium chloride  flush  3 mL Intravenous Q12H   sodium chloride  flush  3 mL Intravenous Q12H    acetaminophen  **OR** acetaminophen , bisacodyl, hydrALAZINE, HYDROmorphone  (DILAUDID ) injection, ondansetron  **OR** ondansetron  (ZOFRAN ) IV, oxyCODONE , senna-docusate, sodium phosphate , traZODone    Objective:   Vitals:   05/27/24 2254 05/28/24 0012 05/28/24 0500 05/28/24 0702  BP:  (!) 144/48 (!) 111/56   Pulse: 71 62 66   Resp: 16     Temp:  97.7 F (36.5 C) 97.8 F (36.6 C)   TempSrc:  Temporal Temporal    SpO2: 99% 98% 96% 95%  Weight:   105.7 kg   Height:        Intake/Output Summary (Last 24 hours) at 05/28/2024 1217 Last data filed at 05/28/2024 0547 Gross per 24 hour  Intake 5642.05 ml  Output 850 ml  Net 4792.05 ml   Filed Weights   05/27/24 1129 05/27/24 1746 05/28/24 0500  Weight: 111.1 kg 105.8 kg 105.7 kg     Physical examination:        General:  AAO x 3,  cooperative, no distress;   HEENT:  Normocephalic, PERRL, otherwise with in Normal limits   Neuro:  CNII-XII intact. , normal motor and sensation, reflexes intact   Lungs:   Clear to auscultation BL, Respirations unlabored,  No wheezes / crackles  Cardio:    S1/S2, RRR, No murmure, No Rubs or Gallops   Abdomen:  Soft, non-tender, bowel sounds active all four quadrants, no guarding or peritoneal signs.  Muscular  skeletal:  Limited exam -global generalized weaknesses - in bed, able to move all 4 extremities,   2+ pulses,  symmetric, No pitting edema  Skin:  Dry, warm to touch, negative for any Rashes,  Wounds: Please see nursing documentation  Pressure Injury 05/27/24 Sacrum Stage 2 -  Partial thickness loss of dermis presenting as a shallow open injury with a red, pink wound bed without slough. 4 small stage II present, each measures 0.4x 0.4 x 0.2 (Active)  05/27/24 1815  Location: Sacrum  Location Orientation:   Staging: Stage 2 -  Partial thickness loss of dermis presenting as a shallow open injury with a red, pink wound bed without slough.  Wound Description (Comments): 4 small stage II present, each measures 0.4x 0.4 x 0.2  Present on Admission:          ------------------------------------------------------------------------------------------------------------------------------------------    LABs:     Latest Ref Rng & Units 05/28/2024    4:56 AM 05/27/2024   11:44 AM 02/19/2024    1:53 PM  CBC  WBC 4.0 - 10.5 K/uL 12.4  19.7  11.3   Hemoglobin 12.0 - 15.0 g/dL 95.2  84.1  32.4   Hematocrit  36.0 - 46.0 % 36.1  41.2  40.4   Platelets 150 - 400 K/uL 275  320  270       Latest Ref Rng & Units 05/28/2024    4:56 AM 05/27/2024   11:44 AM 02/19/2024    1:53 PM  CMP  Glucose 70 - 99 mg/dL 74  94  87   BUN 8 - 23 mg/dL  34  41  24   Creatinine 0.44 - 1.00 mg/dL 2.13  0.86  5.78   Sodium 135 - 145 mmol/L 136  133  135   Potassium 3.5 - 5.1 mmol/L 4.1  4.4  5.0   Chloride 98 - 111 mmol/L 108  107  105   CO2 22 - 32 mmol/L 21  17  20    Calcium 8.9 - 10.3 mg/dL 8.8  9.6  9.3   Total Protein 6.5 - 8.1 g/dL  7.4    Total Bilirubin 0.0 - 1.2 mg/dL  0.4    Alkaline Phos 38 - 126 U/L  61    AST 15 - 41 U/L  23    ALT 0 - 44 U/L  17         Micro Results Recent Results (from the past 240 hours)  Culture, blood (Routine x 2)     Status: None (Preliminary result)   Collection Time: 05/27/24 11:44 AM   Specimen: BLOOD  Result Value Ref Range Status   Specimen Description BLOOD BLOOD LEFT ARM  Final   Special Requests   Final    BOTTLES DRAWN AEROBIC AND ANAEROBIC Blood Culture results may not be optimal due to an inadequate volume of blood received in culture bottles   Culture   Final    NO GROWTH < 24 HOURS Performed at [facility], [address], [place], [place] [address]    Report Status PENDING  Incomplete  Culture, blood (Routine x 2)     Status: None (Preliminary result)   Collection Time: 05/27/24 11:46 AM   Specimen: BLOOD  Result Value Ref Range Status   Specimen Description BLOOD LW  Final   Special Requests   Final    BOTTLES DRAWN AEROBIC AND ANAEROBIC Blood Culture results may not be optimal due to an inadequate volume of blood received in culture bottles   Culture   Final    NO GROWTH < 24 HOURS Performed at [facility], [address], [place], [place] [address]    Report Status PENDING  Incomplete  Resp panel by RT-PCR (RSV, Flu A&B, Covid) Anterior Nasal Swab     Status: None   Collection Time: 05/27/24 11:49 AM   Specimen: Anterior Nasal Swab  Result  Value Ref Range Status   SARS Coronavirus 2 by RT PCR NEGATIVE NEGATIVE Final    Comment: (NOTE) SARS-CoV-2 target nucleic acids are NOT DETECTED.  The SARS-CoV-2 RNA is generally detectable in upper respiratory specimens during the acute phase of infection. The lowest concentration of SARS-CoV-2 viral copies this assay can detect is 138 copies/mL. A negative result does not preclude SARS-Cov-2 infection and should not be used as the sole basis for treatment or other patient management decisions. A negative result may occur with  improper specimen collection/handling, submission of specimen other than nasopharyngeal swab, presence of viral mutation(s) within the areas targeted by this assay, and inadequate number of viral copies(<138 copies/mL). A negative result must be combined with clinical observations, patient history, and epidemiological information. The expected result is Negative.  Fact Sheet for Patients:  BloggerCourse.com  Fact Sheet for Healthcare Providers:  SeriousBroker.it  This test is no t yet approved or cleared by the [place]  FDA and  has been authorized for detection and/or diagnosis of SARS-CoV-2 by FDA under an Emergency Use Authorization (EUA). This EUA will remain  in effect (meaning this test can be used) for the duration of the COVID-19 declaration under Section 564(b)(1)  of the Act, 21 U.S.C.section 360bbb-3(b)(1), unless the authorization is terminated  or revoked sooner.       Influenza A by PCR NEGATIVE NEGATIVE Final   Influenza B by PCR NEGATIVE NEGATIVE Final    Comment: (NOTE) The Xpert Xpress SARS-CoV-2/FLU/RSV plus assay is intended as an aid in the diagnosis of influenza from Nasopharyngeal swab specimens and should not be used as a sole basis for treatment. Nasal washings and aspirates are unacceptable for Xpert Xpress SARS-CoV-2/FLU/RSV testing.  Fact Sheet for  Patients: BloggerCourse.com  Fact Sheet for Healthcare Providers: SeriousBroker.it  This test is not yet approved or cleared by the [place]  FDA and has been authorized for detection and/or diagnosis of SARS-CoV-2 by FDA under an Emergency Use Authorization (EUA). This EUA will remain in effect (meaning this test can be used) for the duration of the COVID-19 declaration under Section 564(b)(1) of the Act, 21 U.S.C. section 360bbb-3(b)(1), unless the authorization is terminated or revoked.     Resp Syncytial Virus by PCR NEGATIVE NEGATIVE Final    Comment: (NOTE) Fact Sheet for Patients: BloggerCourse.com  Fact Sheet for Healthcare Providers: SeriousBroker.it  This test is not yet approved or cleared by the [place]  FDA and has been authorized for detection and/or diagnosis of SARS-CoV-2 by FDA under an Emergency Use Authorization (EUA). This EUA will remain in effect (meaning this test can be used) for the duration of the COVID-19 declaration under Section 564(b)(1) of the Act, 21 U.S.C. section 360bbb-3(b)(1), unless the authorization is terminated or revoked.  Performed at [facility], [address], [place], [place] [address]     Radiology Reports No results found.  SIGNED: Bobbetta Burnet, MD, FHM. FAAFP. Arlin Benes - [facility] Time spent - 55 min.  In seeing, evaluating and examining the patient. Reviewing medical records, labs, drawn plan of care. [facility],  Pager (please use [email or web address] to page/ text) Please use Epic Secure Chat for non-urgent communication (7AM-7PM)  If 7PM-7AM, please contact night-coverage [email or web address], 05/28/2024, 12:17 PM

## 2024-05-28 NOTE — NC FL2 (Signed)
 [place]  MEDICAID FL2 LEVEL OF CARE FORM     IDENTIFICATION  Patient Name: Christina Castaneda Birthdate: February 24, 1956 Sex: female Admission Date (Current Location): 05/27/2024  [facility] and [place] Number:  [employer] and Address:  [facility],  [address] [place] [address]      Provider Number: [id number]  Attending Physician Name and Address:  Bobbetta Burnet, MD  Relative Name and Phone Number:       Current Level of Care: Hospital Recommended Level of Care: Skilled Nursing Facility Prior Approval Number:    Date Approved/Denied:   PASRR Number: [id number]  Discharge Plan: SNF    Current Diagnoses: Patient Active Problem List   Diagnosis Date Noted   AKI (acute kidney injury) (HCC) 05/27/2024   Hyperlipidemia 05/27/2024   Sepsis (HCC) 05/27/2024   Cigarette smoker 01/04/2023   Chronic venous insufficiency 02/21/2017   Lymphedema 02/21/2017   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   Adjustment disorder with mixed anxiety and depressed mood    Debility 05/02/2016   Edema    Generalized weakness    Chronic pain syndrome    Hypoalbuminemia due to protein-calorie malnutrition (HCC)    OSA (obstructive sleep apnea)    Morbid obesity due to excess calories (HCC)    S/P colostomy (HCC)    Tracheostomy status (HCC)    Tracheostomy care (HCC)    COPD  GOLD 2/AB    Tobacco abuse    Essential hypertension    Bilateral knee pain    Dysphagia    Acute blood loss anemia    Hyponatremia    Thyroid  activity decreased    Acute respiratory failure (HCC)    Pressure ulcer 04/01/2016   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   Hypothyroid 03/29/2016   Open wound of abdomen 03/29/2016   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   Complex endometrial hyperplasia 11/12/2012   Obesity 11/12/2012    Orientation RESPIRATION BLADDER Height & Weight     Self, Time, Situation, Place  O2 (4L currently see D/C summary for changes) Incontinent Weight: 233 lb  0.4 oz (105.7 kg) Height:  5\' 5"  (165.1 cm)  BEHAVIORAL SYMPTOMS/MOOD NEUROLOGICAL BOWEL NUTRITION STATUS      Colostomy Diet (Regular)  AMBULATORY STATUS COMMUNICATION OF NEEDS Skin   Extensive Assist Verbally Normal, Other (Comment) (Pressure injury to sacrum stage 2)                       Personal Care Assistance Level of Assistance  Bathing, Dressing, Feeding Bathing Assistance: Limited assistance Feeding assistance: Independent Dressing Assistance: Limited assistance     Functional Limitations Info  Sight, Hearing, Speech Sight Info: Impaired Hearing Info: Adequate Speech Info: Adequate    SPECIAL CARE FACTORS FREQUENCY  PT (By licensed PT), OT (By licensed OT)     PT Frequency: 5 times weekly OT Frequency: 5 times weekly            Contractures Contractures Info: Not present    Additional Factors Info  Code Status, Allergies Code Status Info: FULL Allergies Info: Penicillins, Biphosphate, Fluticasone           Current Medications (05/28/2024):  This is the current hospital active medication list Current Facility-Administered Medications  Medication Dose Route Frequency Provider Last Rate Last Admin   acetaminophen  (TYLENOL ) tablet 650 mg  650 mg Oral Q6H PRN Shahmehdi, Seyed A, MD       Or   acetaminophen  (TYLENOL ) suppository 650 mg  650 mg  Rectal Q6H PRN Bobbetta Burnet, MD       allopurinol  (ZYLOPRIM ) tablet 100 mg  100 mg Oral Daily Shahmehdi, Seyed A, MD   100 mg at 05/28/24 0918   azithromycin (ZITHROMAX) 500 mg in sodium chloride  0.9 % 250 mL IVPB  500 mg Intravenous Q24H Shahmehdi, Seyed A, MD 250 mL/hr at 05/28/24 1300 500 mg at 05/28/24 1300   bisacodyl (DULCOLAX) EC tablet 5 mg  5 mg Oral Daily PRN Shahmehdi, Seyed A, MD       cefTRIAXone (ROCEPHIN) 1 g in sodium chloride  0.9 % 100 mL IVPB  1 g Intravenous Q24H Shahmehdi, Seyed A, MD 200 mL/hr at 05/28/24 1222 1 g at 05/28/24 1222   cholecalciferol (VITAMIN D3) 25 MCG (1000 UNIT) tablet  2,000 Units  2,000 Units Oral Daily Shahmehdi, Seyed A, MD   2,000 Units at 05/28/24 0918   fluticasone furoate-vilanterol (BREO ELLIPTA) 200-25 MCG/ACT 1 puff  1 puff Inhalation QHS Shahmehdi, Seyed A, MD       heparin injection 5,000 Units  5,000 Units Subcutaneous Q8H Shahmehdi, Seyed A, MD   5,000 Units at 05/28/24 1223   hydrALAZINE (APRESOLINE) injection 10 mg  10 mg Intravenous Q4H PRN Shahmehdi, Seyed A, MD       HYDROmorphone  (DILAUDID ) injection 0.5-1 mg  0.5-1 mg Intravenous Q2H PRN Shahmehdi, Seyed A, MD   0.5 mg at 05/28/24 0918   ipratropium (ATROVENT) nebulizer solution 0.5 mg  0.5 mg Nebulization BID Shahmehdi, Seyed A, MD       levalbuterol (XOPENEX) nebulizer solution 0.63 mg  0.63 mg Nebulization BID Shahmehdi, Seyed A, MD       levothyroxine  (SYNTHROID ) tablet 150 mcg  150 mcg Oral Daily Shahmehdi, Seyed A, MD   150 mcg at 05/28/24 0522   nystatin (MYCOSTATIN/NYSTOP) topical powder   Topical TID Bobbetta Burnet, MD   Given at 05/28/24 0919   ondansetron  (ZOFRAN ) tablet 4 mg  4 mg Oral Q6H PRN Shahmehdi, Constantino Demark, MD       Or   ondansetron  (ZOFRAN ) injection 4 mg  4 mg Intravenous Q6H PRN Shahmehdi, Seyed A, MD       oxyCODONE  (Oxy IR/ROXICODONE ) immediate release tablet 5 mg  5 mg Oral Q4H PRN Shahmehdi, Seyed A, MD   5 mg at 05/28/24 0522   rosuvastatin (CRESTOR) tablet 10 mg  10 mg Oral Daily Shahmehdi, Seyed A, MD   10 mg at 05/28/24 9528   senna-docusate (Senokot-S) tablet 1 tablet  1 tablet Oral QHS PRN Shahmehdi, Seyed A, MD       senna-docusate (Senokot-S) tablet 2 tablet  2 tablet Oral TID AC Shahmehdi, Seyed A, MD   2 tablet at 05/28/24 1223   sodium chloride  flush (NS) 0.9 % injection 3 mL  3 mL Intravenous Q12H Shahmehdi, Seyed A, MD   3 mL at 05/28/24 0919   sodium chloride  flush (NS) 0.9 % injection 3 mL  3 mL Intravenous Q12H Shahmehdi, Seyed A, MD   3 mL at 05/28/24 0919   sodium phosphate  (FLEET) enema 1 enema  1 enema Rectal Once PRN Shahmehdi, Seyed A, MD        traZODone  (DESYREL ) tablet 25 mg  25 mg Oral QHS PRN Bobbetta Burnet, MD         Discharge Medications: Please see discharge summary for a list of discharge medications.  Relevant Imaging Results:  Relevant Lab Results:   Additional Information SSN: [id number]  LCSWA

## 2024-05-29 DIAGNOSIS — A419 Sepsis, unspecified organism: Secondary | ICD-10-CM | POA: Diagnosis not present

## 2024-05-29 DIAGNOSIS — J9600 Acute respiratory failure, unspecified whether with hypoxia or hypercapnia: Secondary | ICD-10-CM | POA: Diagnosis not present

## 2024-05-29 DIAGNOSIS — R6520 Severe sepsis without septic shock: Secondary | ICD-10-CM | POA: Diagnosis not present

## 2024-05-29 LAB — CBC
HCT: 33.5 % — ABNORMAL LOW (ref 36.0–46.0)
Hemoglobin: 10.3 g/dL — ABNORMAL LOW (ref 12.0–15.0)
MCH: 31.2 pg (ref 26.0–34.0)
MCHC: 30.7 g/dL (ref 30.0–36.0)
MCV: 101.5 fL — ABNORMAL HIGH (ref 80.0–100.0)
Platelets: 279 10*3/uL (ref 150–400)
RBC: 3.3 MIL/uL — ABNORMAL LOW (ref 3.87–5.11)
RDW: 14.5 % (ref 11.5–15.5)
WBC: 11.1 10*3/uL — ABNORMAL HIGH (ref 4.0–10.5)
nRBC: 0 % (ref 0.0–0.2)

## 2024-05-29 LAB — BASIC METABOLIC PANEL WITH GFR
Anion gap: 8 (ref 5–15)
BUN: 22 mg/dL (ref 8–23)
CO2: 22 mmol/L (ref 22–32)
Calcium: 8.7 mg/dL — ABNORMAL LOW (ref 8.9–10.3)
Chloride: 108 mmol/L (ref 98–111)
Creatinine, Ser: 0.81 mg/dL (ref 0.44–1.00)
GFR, Estimated: >60 mL/min (ref >60)
Glucose, Bld: 78 mg/dL (ref 70–99)
Potassium: 4 mmol/L (ref 3.5–5.1)
Sodium: 138 mmol/L (ref 135–145)

## 2024-05-29 LAB — GLUCOSE, CAPILLARY: Glucose-Capillary: 88 mg/dL (ref 70–99)

## 2024-05-29 MED ORDER — LEVOFLOXACIN 500 MG PO TABS
750.0000 mg | ORAL_TABLET | Freq: Every day | ORAL | Status: DC
  Administered 2024-05-29 – 2024-05-31 (×3): 750 mg via ORAL
  Filled 2024-05-29 (×3): qty 2

## 2024-05-29 NOTE — TOC Progression Note (Signed)
 Transition of Care [facility]) - Progression Note    Patient Details  Name: Christina Castaneda MRN: [id number] Date of Birth: October 30, 1956  [facility] CM/SW Contact  [place] L[place], [place] Phone Number: 05/29/2024, 11:09 AM  Clinical Narrative:    CSW met with pt at bedside to review bed offers for SNF. Pt states that she would like to go home and does not wish to go to SNF. CSW spoke with pt about PT recommendation, pt states she understands but would like to go home with [facility]. Pt states that she has a walker, lift chair wheelchair and left seat on the toilet to use in the home. Pt is agreeable to [facility] PT being arranged and does not have an agency preference. CSW spoke to [place] with [facility] who accepts [facility] PT/OT, CSW requested that MD place [facility] orders. TOC to follow.   Expected Discharge Plan: Skilled Nursing Facility Barriers to Discharge: Continued Medical Work up  Expected Discharge Plan and Services In-house Referral: Clinical Social Work Discharge Planning Services: CM Consult Post Acute Care Choice: Skilled Nursing Facility Living arrangements for the past 2 months: Single Family Home                                       Social Determinants of Health (SDOH) Interventions SDOH Screenings   Food Insecurity: No Food Insecurity (05/27/2024)  Housing: Low Risk  (05/27/2024)  Transportation Needs: No Transportation Needs (05/27/2024)  Utilities: Not At Risk (05/27/2024)  Social Connections: Moderately Isolated (05/27/2024)  Tobacco Use: High Risk (05/27/2024)    Readmission Risk Interventions    05/27/2024   10:01 PM  Readmission Risk Prevention Plan  Post Dischage Appt Complete  Medication Screening Complete  Transportation Screening Complete

## 2024-05-29 NOTE — Progress Notes (Signed)
 PROGRESS NOTE    Patient: Christina Castaneda                            PCP: Malka Sea, DO                    DOB: [birth year]            DOA: 05/27/2024 ZOX:[id number]             DOS: 05/29/2024, 12:39 PM   LOS: 2 days   Date of Service: The patient was seen and examined on 05/29/2024  Subjective:   The patient was seen and examined this morning, much more awake, interactive, Down from 4 L to 1.5 L of oxygen, satting 91%-otherwise hemodynamically stable   Brief Narrative:   Christina Castaneda is a HTN, HLD, OSA, gout, GERD, COPD, hypothyroidism, B12 deficiency presented to ED with Chief feeling poorly for past 2 weeks symptoms including cough, poor p.o. intake. Reporting of generalized weaknesses.  Mild shortness of breath but no chest pain.  And low-grade fever at home.  ED evaluation: Blood pressure (!) 97/58, pulse 93, temp.97.9 F (36.6 C), temp. source Axillary, resp. rate (!) 25, weight 111.1 kg, SpO2 99%.  On 2 L of oxygen  WBC 19.7, sodium 133, CO2 17, BUN 41, Creatinine 1. 77, lactic acid 1.6 CXR:*Heterogeneous opacities in the left retrocardiac region, which may represent atelectasis versus pneumonitis.  Requested patient to be admitted likely due to pneumonia, meeting SIRS criteria with leukocytosis, hypotension, tachypnea    Assessment & Plan:   Principal Problem:   Sepsis (HCC) Active Problems:   COPD  GOLD 2/AB   AKI (acute kidney injury) (HCC)   Hypothyroid   OSA (obstructive sleep apnea)   Hyperlipidemia   Obesity   Tobacco abuse   Essential hypertension   Polyarticular gout   Tracheostomy care (HCC)   Dysphagia   Debility   Generalized weakness   Chronic pain syndrome   Adjustment disorder with mixed anxiety and depressed mood     Assessment and Plan: * Sepsis (HCC) --Improved sepsis physiology - O2 demand improved from 4 L to 1.5 L satting 91% (Not O2 dependent at baseline)  POA : met Sepsis criteria, with tachypnea, leukocytosis,  hypotension, hypoxia with AKI - Likely source possibly Pneumonia - WBC 19.7, lactic acid 1.6 >> 1.4, 1.0, - Viral panel negative - Procalcitonin 0.32  >>   - Cont with IVF, 2Ls plus maintenance fluids,-discontinued  - Antibiotics of IV azithromycin and Rocephin -switching to p.o. Levaquin - Blood cultures>> no growth to date   AKI (acute kidney injury) (HCC) -Much improved - Likely due to sepsis, infection hypovolemia hypotension - Reducing then discontinuing IVF - avoid nephrotoxins, avoid hypotension -Monitor BUN/creatinine closely Lab Results  Component Value Date   CREATININE 0.81 05/29/2024   CREATININE 1.10 (H) 05/28/2024   CREATININE 1.77 (H) 05/27/2024       COPD  GOLD 2/AB - 2 L of oxygen, satting 90%>> currently requiring 4 L of oxygen, satting 95%>>> Down to 1.5 L, satting 91% - Continue DuoNeb bronchodilators, Reviewing home inhalers will resume according  Hyperlipidemia - Continue low-dose statin  OSA (obstructive sleep apnea) - Continue CPAP nightly  Hypothyroid - Home dose Synthroid , checking TSH  Polyarticular gout - continue home allopurinol  Or holding naproxen   Essential hypertension -BP still soft - Hypotensive on arrival, with holding home BP meds -Holding metoprolol , and Benicar  -BP  closely  Tobacco abuse - Discussed regarding smoking cessation -Refused NicoDerm patch  Obesity Body mass index is 40.77 kg/m. - Cholesterol weight loss program, follow-up with -Dietary modification increase activity  Adjustment disorder with mixed anxiety and depressed mood - -Currently stable, monitor closely, as needed  Generalized weakness/with severe debility, falls - ConsultedPT OT, fall precautions-recommending SNF patient is agreeable - Fall precaution   Dysphagia - Bedside swallow evaluation, will consider speech  eval  ----------------------------------------------------------------------------------------------------------------------- Nutritional status:  The patient's BMI is: Body mass index is 40.17 kg/m. I agree with the assessment and plan as outlined   Skin Assessment: I have examined the patient's skin and I agree with the wound assessment as performed by wound care team As outlined belowe: Pressure Injury 05/27/24 Sacrum Stage 2 -  Partial thickness loss of dermis presenting as a shallow open injury with a red, pink wound bed without slough. 4 small stage II present, each measures 0.4x 0.4 x 0.2 (Active)  05/27/24 1815  Location: Sacrum  Location Orientation:   Staging: Stage 2 -  Partial thickness loss of dermis presenting as a shallow open injury with a red, pink wound bed without slough.  Wound Description (Comments): 4 small stage II present, each measures 0.4x 0.4 x 0.2  Present on Admission:   Dressing Type Foam - Lift dressing to assess site every shift 05/29/24 0900     ---------------------------------------------------------------------------------------------------------------------------------------------------- Cultures; Blood Cultures x 2 >>  Urine Culture  >>>   Sputum Culture >>    ------------------------------------------------------------------------------------------------------------------------------------------------  DVT prophylaxis:  heparin injection 5,000 Units Start: 05/27/24 2200 TED hose Start: 05/27/24 1357 SCDs Start: 05/27/24 1357   Code Status:   Code Status: Full Code  Family Communication: No family member present at bedside- -Advance care planning has been discussed.   Admission status:   Status is: Inpatient Remains inpatient appropriate because: Meeting sepsis criteria with IV antibiotics   Disposition: From  - home             Planning for discharge in 1-2 days: to SNF    Procedures:   No admission procedures for hospital  encounter.   Antimicrobials:  Anti-infectives (From admission, onward)    Start     Dose/Rate Route Frequency Ordered Stop   05/29/24 1000  levofloxacin (LEVAQUIN) tablet 750 mg        750 mg Oral Daily 05/29/24 0757     05/28/24 1300  azithromycin (ZITHROMAX) 500 mg in sodium chloride  0.9 % 250 mL IVPB  Status:  Discontinued        500 mg 250 mL/hr over 60 Minutes Intravenous Every 24 hours 05/27/24 1441 05/29/24 0757   05/28/24 1200  cefTRIAXone (ROCEPHIN) 1 g in sodium chloride  0.9 % 100 mL IVPB  Status:  Discontinued        1 g 200 mL/hr over 30 Minutes Intravenous Every 24 hours 05/27/24 1441 05/29/24 0757   05/27/24 1400  levofloxacin (LEVAQUIN) IVPB 750 mg  Status:  Discontinued        750 mg 100 mL/hr over 90 Minutes Intravenous Every 24 hours 05/27/24 1359 05/27/24 1441   05/27/24 1230  cefTRIAXone (ROCEPHIN) 2 g in sodium chloride  0.9 % 100 mL IVPB        2 g 200 mL/hr over 30 Minutes Intravenous Once 05/27/24 1215 05/27/24 1249   05/27/24 1230  azithromycin (ZITHROMAX) 500 mg in sodium chloride  0.9 % 250 mL IVPB        500 mg 250 mL/hr over 60 Minutes  Intravenous  Once 05/27/24 1215 05/27/24 1404        Medication:   allopurinol   100 mg Oral Daily   cholecalciferol  2,000 Units Oral Daily   fluticasone furoate-vilanterol  1 puff Inhalation QHS   heparin  5,000 Units Subcutaneous Q8H   ipratropium  0.5 mg Nebulization BID   levalbuterol  0.63 mg Nebulization BID   levofloxacin  750 mg Oral Daily   levothyroxine   150 mcg Oral Daily   nystatin   Topical TID   rosuvastatin  10 mg Oral Daily   senna-docusate  2 tablet Oral TID AC   sodium chloride  flush  3 mL Intravenous Q12H    acetaminophen  **OR** acetaminophen , bisacodyl, hydrALAZINE, HYDROmorphone  (DILAUDID ) injection, ondansetron  **OR** ondansetron  (ZOFRAN ) IV, oxyCODONE , senna-docusate, sodium phosphate , traZODone    Objective:   Vitals:   05/28/24 2059 05/28/24 2237 05/29/24 0508 05/29/24 0842  BP:   (!)  113/51   Pulse:  81 (!) 58   Resp:  17 19   Temp:   98.2 F (36.8 C)   TempSrc:   Oral   SpO2: 92% 97% 91% 91%  Weight:   109.5 kg   Height:        Intake/Output Summary (Last 24 hours) at 05/29/2024 1239 Last data filed at 05/29/2024 0600 Gross per 24 hour  Intake 1483.37 ml  Output 825 ml  Net 658.37 ml   Filed Weights   05/27/24 1746 05/28/24 0500 05/29/24 0508  Weight: 105.8 kg 105.7 kg 109.5 kg     Physical examination:          General:  AAO x 3,  cooperative, no distress;   HEENT:  Normocephalic, PERRL, otherwise with in Normal limits   Neuro:  CNII-XII intact. , normal motor and sensation, reflexes intact   Lungs:   Clear to auscultation BL, Respirations unlabored,  No wheezes / crackles  Cardio:    S1/S2, RRR, No murmure, No Rubs or Gallops   Abdomen:  Soft, non-tender, bowel sounds active all four quadrants, no guarding or peritoneal signs.  Muscular  skeletal:  Limited exam -global generalized weaknesses - in bed, able to move all 4 extremities,   2+ pulses,  symmetric, No pitting edema  Skin:  Dry, warm to touch, negative for any Rashes,  Wounds: Please see nursing documentation  Pressure Injury 05/27/24 Sacrum Stage 2 -  Partial thickness loss of dermis presenting as a shallow open injury with a red, pink wound bed without slough. 4 small stage II present, each measures 0.4x 0.4 x 0.2 (Active)  05/27/24 1815  Location: Sacrum  Location Orientation:   Staging: Stage 2 -  Partial thickness loss of dermis presenting as a shallow open injury with a red, pink wound bed without slough.  Wound Description (Comments): 4 small stage II present, each measures 0.4x 0.4 x 0.2  Present on Admission:   Dressing Type Foam - Lift dressing to assess site every shift 05/29/24 0900             ------------------------------------------------------------------------------------------------------------------------------------------    LABs:     Latest Ref Rng &  Units 05/29/2024    5:02 AM 05/28/2024    4:56 AM 05/27/2024   11:44 AM  CBC  WBC 4.0 - 10.5 K/uL 11.1  12.4  19.7   Hemoglobin 12.0 - 15.0 g/dL 16.1  09.6  04.5   Hematocrit 36.0 - 46.0 % 33.5  36.1  41.2   Platelets 150 - 400 K/uL 279  275  320  Latest Ref Rng & Units 05/29/2024    5:02 AM 05/28/2024    4:56 AM 05/27/2024   11:44 AM  CMP  Glucose 70 - 99 mg/dL 78  74  94   BUN 8 - 23 mg/dL 22  34  41   Creatinine 0.44 - 1.00 mg/dL 5.78  4.69  6.29   Sodium 135 - 145 mmol/L 138  136  133   Potassium 3.5 - 5.1 mmol/L 4.0  4.1  4.4   Chloride 98 - 111 mmol/L 108  108  107   CO2 22 - 32 mmol/L 22  21  17    Calcium 8.9 - 10.3 mg/dL 8.7  8.8  9.6   Total Protein 6.5 - 8.1 g/dL   7.4   Total Bilirubin 0.0 - 1.2 mg/dL   0.4   Alkaline Phos 38 - 126 U/L   61   AST 15 - 41 U/L   23   ALT 0 - 44 U/L   17        Micro Results Recent Results (from the past 240 hours)  Culture, blood (Routine x 2)     Status: None (Preliminary result)   Collection Time: 05/27/24 11:44 AM   Specimen: BLOOD  Result Value Ref Range Status   Specimen Description BLOOD BLOOD LEFT ARM  Final   Special Requests   Final    BOTTLES DRAWN AEROBIC AND ANAEROBIC Blood Culture results may not be optimal due to an inadequate volume of blood received in culture bottles   Culture   Final    NO GROWTH 2 DAYS Performed at [facility], [address] [place], [place] [address]    Report Status PENDING  Incomplete  Culture, blood (Routine x 2)     Status: None (Preliminary result)   Collection Time: 05/27/24 11:46 AM   Specimen: BLOOD  Result Value Ref Range Status   Specimen Description BLOOD LW  Final   Special Requests   Final    BOTTLES DRAWN AEROBIC AND ANAEROBIC Blood Culture results may not be optimal due to an inadequate volume of blood received in culture bottles   Culture   Final    NO GROWTH 2 DAYS Performed at [facility], [address] [place], [place] [address]    Report Status PENDING   Incomplete  Resp panel by RT-PCR (RSV, Flu A&B, Covid) Anterior Nasal Swab     Status: None   Collection Time: 05/27/24 11:49 AM   Specimen: Anterior Nasal Swab  Result Value Ref Range Status   SARS Coronavirus 2 by RT PCR NEGATIVE NEGATIVE Final    Comment: (NOTE) SARS-CoV-2 target nucleic acids are NOT DETECTED.  The SARS-CoV-2 RNA is generally detectable in upper respiratory specimens during the acute phase of infection. The lowest concentration of SARS-CoV-2 viral copies this assay can detect is 138 copies/mL. A negative result does not preclude SARS-Cov-2 infection and should not be used as the sole basis for treatment or other patient management decisions. A negative result may occur with  improper specimen collection/handling, submission of specimen other than nasopharyngeal swab, presence of viral mutation(s) within the areas targeted by this assay, and inadequate number of viral copies(<138 copies/mL). A negative result must be combined with clinical observations, patient history, and epidemiological information. The expected result is Negative.  Fact Sheet for Patients:  BloggerCourse.com  Fact Sheet for Healthcare Providers:  SeriousBroker.it  This test is no t yet approved or cleared by the [place]  FDA and  has been authorized for detection and/or diagnosis of SARS-CoV-2 by FDA under an Emergency Use Authorization (EUA). This EUA will remain  in effect (meaning this test can be used) for the duration of the COVID-19 declaration under Section 564(b)(1) of the Act, 21 U.S.C.section 360bbb-3(b)(1), unless the authorization is terminated  or revoked sooner.       Influenza A by PCR NEGATIVE NEGATIVE Final   Influenza B by PCR NEGATIVE NEGATIVE Final    Comment: (NOTE) The Xpert Xpress SARS-CoV-2/FLU/RSV plus assay is intended as an aid in the diagnosis of influenza from Nasopharyngeal swab specimens and should  not be used as a sole basis for treatment. Nasal washings and aspirates are unacceptable for Xpert Xpress SARS-CoV-2/FLU/RSV testing.  Fact Sheet for Patients: BloggerCourse.com  Fact Sheet for Healthcare Providers: SeriousBroker.it  This test is not yet approved or cleared by the [place]  FDA and has been authorized for detection and/or diagnosis of SARS-CoV-2 by FDA under an Emergency Use Authorization (EUA). This EUA will remain in effect (meaning this test can be used) for the duration of the COVID-19 declaration under Section 564(b)(1) of the Act, 21 U.S.C. section 360bbb-3(b)(1), unless the authorization is terminated or revoked.     Resp Syncytial Virus by PCR NEGATIVE NEGATIVE Final    Comment: (NOTE) Fact Sheet for Patients: BloggerCourse.com  Fact Sheet for Healthcare Providers: SeriousBroker.it  This test is not yet approved or cleared by the [place]  FDA and has been authorized for detection and/or diagnosis of SARS-CoV-2 by FDA under an Emergency Use Authorization (EUA). This EUA will remain in effect (meaning this test can be used) for the duration of the COVID-19 declaration under Section 564(b)(1) of the Act, 21 U.S.C. section 360bbb-3(b)(1), unless the authorization is terminated or revoked.  Performed at [facility], [address], [place], [place] [address]     Radiology Reports No results found.  SIGNED: Bobbetta Burnet, MD, FHM. FAAFP. Arlin Benes - [facility] Time spent - 55 min.  In seeing, evaluating and examining the patient. Reviewing medical records, labs, drawn plan of care. [facility],  Pager (please use [email or web address] to page/ text) Please use Epic Secure Chat for non-urgent communication (7AM-7PM)  If 7PM-7AM, please contact night-coverage [email or web address], 05/29/2024, 12:39 PM

## 2024-05-29 NOTE — Plan of Care (Signed)
  Problem: Fluid Volume: Goal: Hemodynamic stability will improve Outcome: Progressing   Problem: Clinical Measurements: Goal: Diagnostic test results will improve Outcome: Progressing Goal: Signs and symptoms of infection will decrease Outcome: Progressing   Problem: Respiratory: Goal: Ability to maintain adequate ventilation will improve Outcome: Progressing   Problem: Activity: Goal: Ability to tolerate increased activity will improve Outcome: Progressing   Problem: Clinical Measurements: Goal: Ability to maintain a body temperature in the normal range will improve Outcome: Progressing   Problem: Respiratory: Goal: Ability to maintain adequate ventilation will improve Outcome: Progressing Goal: Ability to maintain a clear airway will improve Outcome: Progressing   

## 2024-05-29 NOTE — Care Management Important Message (Signed)
 Important Message  Patient Details  Name: Christina Castaneda MRN: [id number] Date of Birth: 24-Jun-1956   Important Message Given:  Yes - Medicare IM     Kamarius Buckbee L Joshuah Minella 05/29/2024, 2:15 PM

## 2024-05-30 DIAGNOSIS — J9600 Acute respiratory failure, unspecified whether with hypoxia or hypercapnia: Secondary | ICD-10-CM | POA: Diagnosis not present

## 2024-05-30 DIAGNOSIS — A419 Sepsis, unspecified organism: Secondary | ICD-10-CM | POA: Diagnosis not present

## 2024-05-30 DIAGNOSIS — R6520 Severe sepsis without septic shock: Secondary | ICD-10-CM | POA: Diagnosis not present

## 2024-05-30 LAB — BASIC METABOLIC PANEL WITH GFR
Anion gap: 6 (ref 5–15)
BUN: 20 mg/dL (ref 8–23)
CO2: 21 mmol/L — ABNORMAL LOW (ref 22–32)
Calcium: 8.7 mg/dL — ABNORMAL LOW (ref 8.9–10.3)
Chloride: 106 mmol/L (ref 98–111)
Creatinine, Ser: 0.83 mg/dL (ref 0.44–1.00)
GFR, Estimated: >60 mL/min (ref >60)
Glucose, Bld: 66 mg/dL — ABNORMAL LOW (ref 70–99)
Potassium: 4.1 mmol/L (ref 3.5–5.1)
Sodium: 133 mmol/L — ABNORMAL LOW (ref 135–145)

## 2024-05-30 LAB — GLUCOSE, CAPILLARY: Glucose-Capillary: 79 mg/dL (ref 70–99)

## 2024-05-30 LAB — CBC
HCT: 32.5 % — ABNORMAL LOW (ref 36.0–46.0)
Hemoglobin: 10.5 g/dL — ABNORMAL LOW (ref 12.0–15.0)
MCH: 32.7 pg (ref 26.0–34.0)
MCHC: 32.3 g/dL (ref 30.0–36.0)
MCV: 101.2 fL — ABNORMAL HIGH (ref 80.0–100.0)
Platelets: 286 10*3/uL (ref 150–400)
RBC: 3.21 MIL/uL — ABNORMAL LOW (ref 3.87–5.11)
RDW: 14.3 % (ref 11.5–15.5)
WBC: 9.2 10*3/uL (ref 4.0–10.5)
nRBC: 0 % (ref 0.0–0.2)

## 2024-05-30 MED ORDER — FUROSEMIDE 10 MG/ML IJ SOLN
40.0000 mg | Freq: Once | INTRAMUSCULAR | Status: AC
  Administered 2024-05-30: 40 mg via INTRAVENOUS
  Filled 2024-05-30: qty 4

## 2024-05-30 NOTE — Progress Notes (Signed)
 PROGRESS NOTE    Patient: Christina Castaneda                            PCP: Malka Sea, DO                    DOB: [birth year]            DOA: 05/27/2024 ZOX:[id number]             DOS: 05/30/2024, 10:40 AM   LOS: 3 days   Date of Service: The patient was seen and examined on 05/30/2024  Subjective:   Patient was seen and examined this morning, awake alert oriented, complain of generalized weakness, reported not feeling too well Still requiring about 2 L of oxygen-maintaining O2 sat of 95% States she is not O2 dependent at baseline-otherwise hemodynamically stable (Tmax 99.2)  Brief Narrative:   Christina Castaneda is a HTN, HLD, OSA, gout, GERD, COPD, hypothyroidism, B12 deficiency presented to ED with Chief feeling poorly for past 2 weeks symptoms including cough, poor p.o. intake. Reporting of generalized weaknesses.  Mild shortness of breath but no chest pain.  And low-grade fever at home.  ED evaluation: Blood pressure (!) 97/58, pulse 93, temp.97.9 F (36.6 C), temp. source Axillary, resp. rate (!) 25, weight 111.1 kg, SpO2 99%.  On 2 L of oxygen  WBC 19.7, sodium 133, CO2 17, BUN 41, Creatinine 1. 77, lactic acid 1.6 CXR:*Heterogeneous opacities in the left retrocardiac region, which may represent atelectasis versus pneumonitis.  Requested patient to be admitted likely due to pneumonia, meeting SIRS criteria with leukocytosis, hypotension, tachypnea    Assessment & Plan:   Principal Problem:   Sepsis (HCC) Active Problems:   COPD  GOLD 2/AB   AKI (acute kidney injury) (HCC)   Hypothyroid   OSA (obstructive sleep apnea)   Hyperlipidemia   Obesity   Tobacco abuse   Essential hypertension   Polyarticular gout   Tracheostomy care (HCC)   Dysphagia   Debility   Generalized weakness   Chronic pain syndrome   Adjustment disorder with mixed anxiety and depressed mood     Assessment and Plan: * Sepsis (HCC) --Improved sepsis physiology, Tmax 99.2, satting 95%  on 2 L of oxygen otherwise hemodynamically stable  (Not O2 dependent at baseline)  POA : met Sepsis criteria, with tachypnea, leukocytosis, hypotension, hypoxia with AKI - Likely source possibly Pneumonia - WBC 19.7, lactic acid 1.6 >> 1.4, 1.0, - Viral panel negative - Procalcitonin 0.32  >>   - Cont with IVF, 2Ls plus maintenance fluids,-discontinued  - Antibiotics of IV azithromycin  and Rocephin  -switching to p.o. Levaquin  - Blood cultures>> no growth to date   AKI (acute kidney injury) (HCC) -Continue to improve - Likely due to sepsis, infection hypovolemia hypotension - Reducing then discontinuing IVF - avoid nephrotoxins, avoid hypotension -Monitor BUN/creatinine closely Lab Results  Component Value Date   CREATININE 0.83 05/30/2024   CREATININE 0.81 05/29/2024   CREATININE 1.10 (H) 05/28/2024    COPD  GOLD 2/AB - 2 L of oxygen, satting 90%>> currently requiring 4 L>>2L  of oxygen, satting 95%>>>  - Continue DuoNeb bronchodilators, Reviewing home inhalers will resume according  Hyperlipidemia - Continue low-dose statin  OSA (obstructive sleep apnea) - Continue CPAP nightly  Hypothyroid - Home dose Synthroid , checking TSH  Polyarticular gout - continue home allopurinol  D/C holding naproxen   Essential hypertension  POA: Blood pressure was soft-Home BP  meds were held -Without BP meds blood pressure stable -Holding metoprolol , and Benicar    Tobacco abuse - Discussed regarding smoking cessation -Refused NicoDerm patch  Obesity Body mass index is 40.77 kg/m. - Cholesterol weight loss program, follow-up with -Dietary modification increase activity  Adjustment disorder with mixed anxiety and depressed mood - -Currently stable, monitor closely, as needed Xanax   Generalized weakness/with severe debility, falls - ConsultedPT OT, fall precautions-Home health- Fall  precaution   Dysphagia Improved  ----------------------------------------------------------------------------------------------------------------------- Nutritional status:  The patient's BMI is: Body mass index is 40.17 kg/m. I agree with the assessment and plan as outlined   Skin Assessment: I have examined the patient's skin and I agree with the wound assessment as performed by wound care team As outlined belowe: Pressure Injury 05/27/24 Sacrum Stage 2 -  Partial thickness loss of dermis presenting as a shallow open injury with a red, pink wound bed without slough. 4 small stage II present, each measures 0.4x 0.4 x 0.2 (Active)  05/27/24 1815  Location: Sacrum  Location Orientation:   Staging: Stage 2 -  Partial thickness loss of dermis presenting as a shallow open injury with a red, pink wound bed without slough.  Wound Description (Comments): 4 small stage II present, each measures 0.4x 0.4 x 0.2  Present on Admission:   Dressing Type Foam - Lift dressing to assess site every shift 05/29/24 0900     ---------------------------------------------------------------------------------------------------------------------------------------------------- Cultures; Blood Cultures x 2 >> no growth to date Urine Culture  >>> no growth to date Viral panel- All negative Sputum culture-Gram stain   ------------------------------------------------------------------------------------------------------------------------------------------------  DVT prophylaxis:  heparin  injection 5,000 Units Start: 05/27/24 2200 TED hose Start: 05/27/24 1357 SCDs Start: 05/27/24 1357   Code Status:   Code Status: Full Code  Family Communication: No family member present at bedside- -Advance care planning has been discussed.   Admission status:   Status is: Inpatient Remains inpatient appropriate because: Meeting sepsis criteria with IV antibiotics   Disposition: From  - home              Planning for discharge in 1 day Home with [facility]    Procedures:   No admission procedures for hospital encounter.   Antimicrobials:  Anti-infectives (From admission, onward)    Start     Dose/Rate Route Frequency Ordered Stop   05/29/24 1000  levofloxacin  (LEVAQUIN ) tablet 750 mg        750 mg Oral Daily 05/29/24 0757     05/28/24 1300  azithromycin  (ZITHROMAX ) 500 mg in sodium chloride  0.9 % 250 mL IVPB  Status:  Discontinued        500 mg 250 mL/hr over 60 Minutes Intravenous Every 24 hours 05/27/24 1441 05/29/24 0757   05/28/24 1200  cefTRIAXone  (ROCEPHIN ) 1 g in sodium chloride  0.9 % 100 mL IVPB  Status:  Discontinued        1 g 200 mL/hr over 30 Minutes Intravenous Every 24 hours 05/27/24 1441 05/29/24 0757   05/27/24 1400  levofloxacin  (LEVAQUIN ) IVPB 750 mg  Status:  Discontinued        750 mg 100 mL/hr over 90 Minutes Intravenous Every 24 hours 05/27/24 1359 05/27/24 1441   05/27/24 1230  cefTRIAXone  (ROCEPHIN ) 2 g in sodium chloride  0.9 % 100 mL IVPB        2 g 200 mL/hr over 30 Minutes Intravenous Once 05/27/24 1215 05/27/24 1249   05/27/24 1230  azithromycin  (ZITHROMAX ) 500 mg in sodium chloride  0.9 % 250 mL IVPB  500 mg 250 mL/hr over 60 Minutes Intravenous  Once 05/27/24 1215 05/27/24 1404        Medication:   allopurinol   100 mg Oral Daily   cholecalciferol   2,000 Units Oral Daily   fluticasone  furoate-vilanterol  1 puff Inhalation QHS   heparin   5,000 Units Subcutaneous Q8H   ipratropium  0.5 mg Nebulization BID   levalbuterol   0.63 mg Nebulization BID   levofloxacin   750 mg Oral Daily   levothyroxine   150 mcg Oral Daily   nystatin    Topical TID   rosuvastatin   10 mg Oral Daily   senna-docusate  2 tablet Oral TID AC   sodium chloride  flush  3 mL Intravenous Q12H    acetaminophen  **OR** acetaminophen , bisacodyl , hydrALAZINE , HYDROmorphone  (DILAUDID ) injection, ondansetron  **OR** ondansetron  (ZOFRAN ) IV, oxyCODONE , senna-docusate, sodium phosphate ,  traZODone    Objective:   Vitals:   05/29/24 2021 05/29/24 2321 05/30/24 0459 05/30/24 0639  BP:   129/65   Pulse:   74   Resp:   18   Temp:   99.2 F (37.3 C)   TempSrc:   Oral   SpO2: 92% 96% 95% 95%  Weight:      Height:        Intake/Output Summary (Last 24 hours) at 05/30/2024 1040 Last data filed at 05/30/2024 1014 Gross per 24 hour  Intake 240 ml  Output 1500 ml  Net -1260 ml   Filed Weights   05/27/24 1746 05/28/24 0500 05/29/24 0508  Weight: 105.8 kg 105.7 kg 109.5 kg     Physical examination:              General:  AAO x 3,  cooperative, no distress;   HEENT:  Normocephalic, PERRL, otherwise with in Normal limits   Neuro:  CNII-XII intact. , normal motor and sensation, reflexes intact   Lungs:   Clear to auscultation BL, Respirations unlabored,  No wheezes / crackles  Cardio:    S1/S2, RRR, No murmure, No Rubs or Gallops   Abdomen:  Soft, non-tender, bowel sounds active all four quadrants, no guarding or peritoneal signs.  Muscular  skeletal:  Limited exam -global generalized weaknesses - in bed, able to move all 4 extremities,   2+ pulses,  symmetric, No pitting edema  Skin:  Dry, warm to touch, negative for any Rashes,  Wounds: Please see nursing documentation  Pressure Injury 05/27/24 Sacrum Stage 2 -  Partial thickness loss of dermis presenting as a shallow open injury with a red, pink wound bed without slough. 4 small stage II present, each measures 0.4x 0.4 x 0.2 (Active)  05/27/24 1815  Location: Sacrum  Location Orientation:   Staging: Stage 2 -  Partial thickness loss of dermis presenting as a shallow open injury with a red, pink wound bed without slough.  Wound Description (Comments): 4 small stage II present, each measures 0.4x 0.4 x 0.2  Present on Admission:   Dressing Type Foam - Lift dressing to assess site every shift 05/29/24 0900                 ------------------------------------------------------------------------------------------------------------------------------------------    LABs:     Latest Ref Rng & Units 05/30/2024    4:24 AM 05/29/2024    5:02 AM 05/28/2024    4:56 AM  CBC  WBC 4.0 - 10.5 K/uL 9.2  11.1  12.4   Hemoglobin 12.0 - 15.0 g/dL 03.4  74.2  59.5   Hematocrit 36.0 - 46.0 % 32.5  33.5  36.1  Platelets 150 - 400 K/uL 286  279  275       Latest Ref Rng & Units 05/30/2024    4:24 AM 05/29/2024    5:02 AM 05/28/2024    4:56 AM  CMP  Glucose 70 - 99 mg/dL 66  78  74   BUN 8 - 23 mg/dL 20  22  34   Creatinine 0.44 - 1.00 mg/dL 8.29  5.62  1.30   Sodium 135 - 145 mmol/L 133  138  136   Potassium 3.5 - 5.1 mmol/L 4.1  4.0  4.1   Chloride 98 - 111 mmol/L 106  108  108   CO2 22 - 32 mmol/L 21  22  21    Calcium  8.9 - 10.3 mg/dL 8.7  8.7  8.8        Micro Results Recent Results (from the past 240 hours)  Culture, blood (Routine x 2)     Status: None (Preliminary result)   Collection Time: 05/27/24 11:44 AM   Specimen: BLOOD  Result Value Ref Range Status   Specimen Description BLOOD BLOOD LEFT ARM  Final   Special Requests   Final    BOTTLES DRAWN AEROBIC AND ANAEROBIC Blood Culture results may not be optimal due to an inadequate volume of blood received in culture bottles   Culture   Final    NO GROWTH 3 DAYS Performed at [facility], [address] [place], [place] [address]    Report Status PENDING  Incomplete  Culture, blood (Routine x 2)     Status: None (Preliminary result)   Collection Time: 05/27/24 11:46 AM   Specimen: BLOOD  Result Value Ref Range Status   Specimen Description BLOOD LW  Final   Special Requests   Final    BOTTLES DRAWN AEROBIC AND ANAEROBIC Blood Culture results may not be optimal due to an inadequate volume of blood received in culture bottles   Culture   Final    NO GROWTH 3 DAYS Performed at [facility], [address], [place], [place] [address]    Report Status  PENDING  Incomplete  Resp panel by RT-PCR (RSV, Flu A&B, Covid) Anterior Nasal Swab     Status: None   Collection Time: 05/27/24 11:49 AM   Specimen: Anterior Nasal Swab  Result Value Ref Range Status   SARS Coronavirus 2 by RT PCR NEGATIVE NEGATIVE Final    Comment: (NOTE) SARS-CoV-2 target nucleic acids are NOT DETECTED.  The SARS-CoV-2 RNA is generally detectable in upper respiratory specimens during the acute phase of infection. The lowest concentration of SARS-CoV-2 viral copies this assay can detect is 138 copies/mL. A negative result does not preclude SARS-Cov-2 infection and should not be used as the sole basis for treatment or other patient management decisions. A negative result may occur with  improper specimen collection/handling, submission of specimen other than nasopharyngeal swab, presence of viral mutation(s) within the areas targeted by this assay, and inadequate number of viral copies(<138 copies/mL). A negative result must be combined with clinical observations, patient history, and epidemiological information. The expected result is Negative.  Fact Sheet for Patients:  BloggerCourse.com  Fact Sheet for Healthcare Providers:  SeriousBroker.it  This test is no t yet approved or cleared by the [place]  FDA and  has been authorized for detection and/or diagnosis of SARS-CoV-2 by FDA under an Emergency Use Authorization (EUA). This EUA will remain  in effect (meaning this test can be used) for the duration of the COVID-19  declaration under Section 564(b)(1) of the Act, 21 U.S.C.section 360bbb-3(b)(1), unless the authorization is terminated  or revoked sooner.       Influenza A by PCR NEGATIVE NEGATIVE Final   Influenza B by PCR NEGATIVE NEGATIVE Final    Comment: (NOTE) The Xpert Xpress SARS-CoV-2/FLU/RSV plus assay is intended as an aid in the diagnosis of influenza from Nasopharyngeal swab specimens  and should not be used as a sole basis for treatment. Nasal washings and aspirates are unacceptable for Xpert Xpress SARS-CoV-2/FLU/RSV testing.  Fact Sheet for Patients: BloggerCourse.com  Fact Sheet for Healthcare Providers: SeriousBroker.it  This test is not yet approved or cleared by the [place]  FDA and has been authorized for detection and/or diagnosis of SARS-CoV-2 by FDA under an Emergency Use Authorization (EUA). This EUA will remain in effect (meaning this test can be used) for the duration of the COVID-19 declaration under Section 564(b)(1) of the Act, 21 U.S.C. section 360bbb-3(b)(1), unless the authorization is terminated or revoked.     Resp Syncytial Virus by PCR NEGATIVE NEGATIVE Final    Comment: (NOTE) Fact Sheet for Patients: BloggerCourse.com  Fact Sheet for Healthcare Providers: SeriousBroker.it  This test is not yet approved or cleared by the [place]  FDA and has been authorized for detection and/or diagnosis of SARS-CoV-2 by FDA under an Emergency Use Authorization (EUA). This EUA will remain in effect (meaning this test can be used) for the duration of the COVID-19 declaration under Section 564(b)(1) of the Act, 21 U.S.C. section 360bbb-3(b)(1), unless the authorization is terminated or revoked.  Performed at [facility], [address], [place], [place] [address]     Radiology Reports No results found.  SIGNED: Bobbetta Burnet, MD, FHM. FAAFP. Arlin Benes - [facility] Time spent - 55 min.  In seeing, evaluating and examining the patient. Reviewing medical records, labs, drawn plan of care. [facility],  Pager (please use [email or web address] to page/ text) Please use Epic Secure Chat for non-urgent communication (7AM-7PM)  If 7PM-7AM, please contact night-coverage [email or web address], 05/30/2024, 10:40 AM

## 2024-05-30 NOTE — Plan of Care (Signed)
  Problem: Education: Goal: Knowledge of General Education information will improve Description: Including pain rating scale, medication(s)/side effects and non-pharmacologic comfort measures Outcome: Progressing   Problem: Pain Managment: Goal: General experience of comfort will improve and/or be controlled Outcome: Progressing   Problem: Skin Integrity: Goal: Risk for impaired skin integrity will decrease Outcome: Progressing

## 2024-05-30 NOTE — Plan of Care (Signed)
   Problem: Education: Goal: Knowledge of General Education information will improve Description: Including pain rating scale, medication(s)/side effects and non-pharmacologic comfort measures Outcome: Progressing   Problem: Clinical Measurements: Goal: Ability to maintain clinical measurements within normal limits will improve Outcome: Progressing Goal: Diagnostic test results will improve Outcome: Progressing

## 2024-05-31 DIAGNOSIS — R6520 Severe sepsis without septic shock: Secondary | ICD-10-CM | POA: Diagnosis not present

## 2024-05-31 DIAGNOSIS — J9600 Acute respiratory failure, unspecified whether with hypoxia or hypercapnia: Secondary | ICD-10-CM | POA: Diagnosis not present

## 2024-05-31 DIAGNOSIS — A419 Sepsis, unspecified organism: Secondary | ICD-10-CM | POA: Diagnosis not present

## 2024-05-31 LAB — BASIC METABOLIC PANEL WITH GFR
Anion gap: 9 (ref 5–15)
BUN: 18 mg/dL (ref 8–23)
CO2: 25 mmol/L (ref 22–32)
Calcium: 8.9 mg/dL (ref 8.9–10.3)
Chloride: 101 mmol/L (ref 98–111)
Creatinine, Ser: 0.84 mg/dL (ref 0.44–1.00)
GFR, Estimated: >60 mL/min (ref >60)
Glucose, Bld: 75 mg/dL (ref 70–99)
Potassium: 3.9 mmol/L (ref 3.5–5.1)
Sodium: 135 mmol/L (ref 135–145)

## 2024-05-31 LAB — CBC
HCT: 34.1 % — ABNORMAL LOW (ref 36.0–46.0)
Hemoglobin: 11 g/dL — ABNORMAL LOW (ref 12.0–15.0)
MCH: 32.7 pg (ref 26.0–34.0)
MCHC: 32.3 g/dL (ref 30.0–36.0)
MCV: 101.5 fL — ABNORMAL HIGH (ref 80.0–100.0)
Platelets: 329 10*3/uL (ref 150–400)
RBC: 3.36 MIL/uL — ABNORMAL LOW (ref 3.87–5.11)
RDW: 14.2 % (ref 11.5–15.5)
WBC: 9.1 10*3/uL (ref 4.0–10.5)
nRBC: 0 % (ref 0.0–0.2)

## 2024-05-31 LAB — GLUCOSE, CAPILLARY: Glucose-Capillary: 88 mg/dL (ref 70–99)

## 2024-05-31 MED ORDER — NYSTATIN 100000 UNIT/GM EX POWD: Freq: Three times a day (TID) | CUTANEOUS | 0 refills | Status: DC

## 2024-05-31 MED ORDER — OXYCODONE HCL 5 MG PO TABS: 5.0000 mg | ORAL_TABLET | Freq: Four times a day (QID) | ORAL | 0 refills | Status: AC | PRN

## 2024-05-31 MED ORDER — FUROSEMIDE 10 MG/ML IJ SOLN
40.0000 mg | Freq: Once | INTRAMUSCULAR | Status: AC
  Administered 2024-05-31: 40 mg via INTRAVENOUS
  Filled 2024-05-31: qty 4

## 2024-05-31 MED ORDER — ALBUTEROL SULFATE HFA 108 (90 BASE) MCG/ACT IN AERS: 2.0000 | INHALATION_SPRAY | Freq: Four times a day (QID) | RESPIRATORY_TRACT | 3 refills | Status: DC | PRN

## 2024-05-31 MED ORDER — FLUTICASONE-SALMETEROL 500-50 MCG/ACT IN AEPB: 1.0000 | INHALATION_SPRAY | Freq: Two times a day (BID) | RESPIRATORY_TRACT | 0 refills | Status: DC

## 2024-05-31 MED ORDER — LEVOFLOXACIN 750 MG PO TABS: 750.0000 mg | ORAL_TABLET | Freq: Every day | ORAL | 0 refills | Status: AC

## 2024-05-31 MED ORDER — SENNOSIDES-DOCUSATE SODIUM 8.6-50 MG PO TABS: 2.0000 | ORAL_TABLET | Freq: Every day | ORAL | 0 refills | Status: DC

## 2024-06-01 LAB — CULTURE, BLOOD (ROUTINE X 2)
Culture: NO GROWTH
Culture: NO GROWTH

## 2024-06-01 NOTE — Discharge Summary (Signed)
 Physician Discharge Summary   Patient: Christina Castaneda MRN: [id number] DOB: [birth year]  Admit date:     05/27/2024  Discharge date: 05/31/2024  Discharge Physician: Bobbetta Burnet   PCP: Malka Sea, DO   Recommendations at discharge:   Follow-up with PCP in 1-2  week   Discharge Diagnoses: Principal Problem:   Sepsis (HCC) Active Problems:   COPD  GOLD 2/AB   AKI (acute kidney injury) (HCC)   Hypothyroid   OSA (obstructive sleep apnea)   Hyperlipidemia   Obesity   Tobacco abuse   Essential hypertension   Polyarticular gout   Tracheostomy care (HCC)   Dysphagia   Debility   Generalized weakness   Chronic pain syndrome   Adjustment disorder with mixed anxiety and depressed mood  Resolved Problems:   * No resolved hospital problems. *  Hospital Course: Christina Castaneda is a HTN, HLD, OSA, gout, GERD, COPD, hypothyroidism, B12 deficiency presented to ED with Chief feeling poorly for past 2 weeks symptoms including cough, poor p.o. intake. Reporting of generalized weaknesses.  Mild shortness of breath but no chest pain.  And low-grade fever at home.  ED evaluation: Blood pressure (!) 97/58, pulse 93, temp.97.9 F (36.6 C), temp. source Axillary, resp. rate (!) 25, weight 111.1 kg, SpO2 99%.  On 2 L of oxygen  WBC 19.7, sodium 133, CO2 17, BUN 41, Creatinine 1. 77, lactic acid 1.6 CXR:*Heterogeneous opacities in the left retrocardiac region, which may represent atelectasis versus pneumonitis.  Requested patient to be admitted likely due to pneumonia, meeting SIRS criteria with leukocytosis, hypotension, tachypnea   * Sepsis (HCC)-resolved POA meeting Sepsis criteria, with tachypnea, leukocytosis, hypotension, hypoxia with AKI - Likely source possibly Pneumonia - WBC 19.7, lactic acid 1.6 - Procalcitonin 0.32   _S/p  IVF, 2Ls of LR in ED  - Patient has been given IV azithromycin , and Rocephin  -due to penicillin allergy patient will be started on IV  Levaquin -changed to p.o. -Will follow-up with blood cultures will try to obtain sputum cultures  AKI (acute kidney injury) (HCC) - Likely due to SIRS, infection hypovolemia hypotension -Continue IV fluids, avoid nephrotoxins -Monitor BUN/creatinine closely Lab Results  Component Value Date   CREATININE 0.84 05/31/2024   CREATININE 0.83 05/30/2024   CREATININE 0.81 05/29/2024     COPD  GOLD 2/AB - 2 L of oxygen, satting 90% -Heart rate closely, as needed DuoNeb bronchodilators, Reviewing home inhalers will resume according  Hyperlipidemia Continue low-dose statin  OSA (obstructive sleep apnea) - Stable 2 L oxygen  Hypothyroid - Home dose Synthroid , checking TSH  Polyarticular gout - Daily as needed analgesics, continue home allopurinol  Or holding naproxen   Essential hypertension - Hypotensive on arrival, was holding home BP meds -Resume  metoprolol , and Benicar    Tobacco abuse - Discussed regarding smoking cessation -Provided  Obesity Body mass index is 40.77 kg/m. - Cholesterol weight loss program, follow-up with -Dietary modification increase activity  Adjustment disorder with mixed anxiety and depressed mood - -Currently stable, monitor closely, as needed  Generalized weakness - Consulting PT OT, fall precautions  Debility - Consulting PT OT for eval -Fall precautions  Dysphagia - Bedside swallow evaluation, will consider speech eval        Disposition: Home Diet recommendation:  Discharge Diet Orders (From admission, onward)     Start     Ordered   05/31/24 0000  Diet - low sodium heart healthy        05/31/24 1118  Regular diet DISCHARGE MEDICATION: Allergies as of 05/31/2024       Reactions   Penicillins Anaphylaxis, Hives, Shortness Of Breath, Swelling   Pt given ceftriaxone  05/27/24 in ED and tolerated   Biphosphate Other (See Comments)   Aching joints   Fluticasone  Other (See Comments)   Headaches         Medication List     STOP taking these medications    naproxen  500 MG tablet Commonly known as: NAPROSYN        TAKE these medications    albuterol  108 (90 Base) MCG/ACT inhaler Commonly known as: VENTOLIN  HFA Inhale 2 puffs into the lungs every 6 (six) hours as needed. Wheezing and shortness of breath   allopurinol  100 MG tablet Commonly known as: Zyloprim  Take 1 tablet (100 mg total) by mouth daily.   Ergocalciferol  50 MCG (2000 UT) Tabs Take 2,000 Units by mouth daily.   fluticasone -salmeterol 500-50 MCG/ACT Aepb Commonly known as: ADVAIR Inhale 1 puff into the lungs in the morning and at bedtime.   gabapentin  300 MG capsule Commonly known as: NEURONTIN  Take 1-2 capsules by mouth at bedtime.   levofloxacin  750 MG tablet Commonly known as: LEVAQUIN  Take 1 tablet (750 mg total) by mouth daily for 5 days.   levothyroxine  150 MCG tablet Commonly known as: SYNTHROID  Take 1 tablet by mouth daily.   nystatin  powder Commonly known as: MYCOSTATIN /NYSTOP  Apply topically 3 (three) times daily.   oxyCODONE  5 MG immediate release tablet Commonly known as: Oxy IR/ROXICODONE  Take 1 tablet (5 mg total) by mouth every 6 (six) hours as needed for up to 3 days for moderate pain (pain score 4-6).   rosuvastatin  10 MG tablet Commonly known as: CRESTOR  Take 10 mg by mouth daily.   senna-docusate 8.6-50 MG tablet Commonly known as: Senokot-S Take 2 tablets by mouth at bedtime.   TYLENOL  500 MG tablet Generic drug: acetaminophen  Take 1,000 mg by mouth every 6 (six) hours. What changed: Another medication with the same name was removed. Continue taking this medication, and follow the directions you see here.   VITAMIN B-12 CR PO Take 1 tablet by mouth daily.               Discharge Care Instructions  (From admission, onward)           Start     Ordered   05/31/24 0000  Discharge wound care:       Comments: Reposition in bed or in chair every 2 hours Wound  care per instructions-  Sacrum Stage 2 -  Partial thickness loss of dermis presenting as a shallow open injury with a red, pink wound bed without slough. 4 small stage II present, each measures 0.4x 0.4 x 0.2   05/31/24 1118            Follow-up Information     [facility].   Specialty: Home Health Services Why: Agency will call to set up home therapy visit. Contact information: [address] [place] [address] [phone]                Discharge Exam: Filed Weights   05/28/24 0500 05/29/24 0508 05/31/24 0522  Weight: 105.7 kg 109.5 kg 104.4 kg        General:  AAO x 3,  cooperative, no distress;   HEENT:  Normocephalic, PERRL, otherwise with in Normal limits   Neuro:  CNII-XII intact. , normal motor and sensation, reflexes intact  Lungs:   Clear to auscultation BL, Respirations unlabored,  No wheezes / crackles  Cardio:    S1/S2, RRR, No murmure, No Rubs or Gallops   Abdomen:  Soft, non-tender, bowel sounds active all four quadrants, no guarding or peritoneal signs.  Muscular  skeletal:  Limited exam -global generalized weaknesses - in bed, able to move all 4 extremities,   2+ pulses,  symmetric, No pitting edema  Skin:  Dry, warm to touch, negative for any Rashes,  Wounds: Please see nursing documentation  Pressure Injury 05/27/24 Sacrum Stage 2 -  Partial thickness loss of dermis presenting as a shallow open injury with a red, pink wound bed without slough. 4 small stage II present, each measures 0.4x 0.4 x 0.2 (Active)  05/27/24 1815  Location: Sacrum  Location Orientation:   Staging: Stage 2 -  Partial thickness loss of dermis presenting as a shallow open injury with a red, pink wound bed without slough.  Wound Description (Comments): 4 small stage II present, each measures 0.4x 0.4 x 0.2  Present on Admission:   Dressing Type Foam - Lift dressing to assess site every shift 05/31/24 0855          Condition  at discharge: fair  The results of significant diagnostics from this hospitalization (including imaging, microbiology, ancillary and laboratory) are listed below for reference.   Imaging Studies: DG Chest Port 1 View Result Date: 05/27/2024 CLINICAL DATA:  [id number] Sepsis (HCC) [id number]. Hypertension. Weakness. EXAM: PORTABLE CHEST 1 VIEW COMPARISON:  04/17/2016. FINDINGS: There are heterogeneous opacities in the left retrocardiac region, which may represent atelectasis versus pneumonitis. Correlate clinically. Bilateral lung fields are otherwise clear. There is subtle blunting of left lateral costophrenic angle, which may be due to overlying lung opacities versus trace pleural effusion. Right lateral costophrenic angle is clear. No pneumothorax on either side. Normal cardio-mediastinal silhouette. No acute osseous abnormalities. The soft tissues are within normal limits. IMPRESSION: *Heterogeneous opacities in the left retrocardiac region, which may represent atelectasis versus pneumonitis. Electronically Signed   By: Beula Brunswick M.D.   On: 05/27/2024 11:56    Microbiology: Results for orders placed or performed during the hospital encounter of 05/27/24  Culture, blood (Routine x 2)     Status: None   Collection Time: 05/27/24 11:44 AM   Specimen: BLOOD  Result Value Ref Range Status   Specimen Description BLOOD BLOOD LEFT ARM  Final   Special Requests   Final    BOTTLES DRAWN AEROBIC AND ANAEROBIC Blood Culture results may not be optimal due to an inadequate volume of blood received in culture bottles   Culture   Final    NO GROWTH 5 DAYS Performed at [facility], [address] [place], [place] [address]    Report Status 06/01/2024 FINAL  Final  Culture, blood (Routine x 2)     Status: None   Collection Time: 05/27/24 11:46 AM   Specimen: BLOOD  Result Value Ref Range Status   Specimen Description BLOOD LW  Final   Special Requests   Final    BOTTLES DRAWN AEROBIC AND ANAEROBIC Blood  Culture results may not be optimal due to an inadequate volume of blood received in culture bottles   Culture   Final    NO GROWTH 5 DAYS Performed at [facility], [address], [place], [place] [address]    Report Status 06/01/2024 FINAL  Final  Resp panel by RT-PCR (RSV, Flu A&B, Covid) Anterior Nasal Swab  Status: None   Collection Time: 05/27/24 11:49 AM   Specimen: Anterior Nasal Swab  Result Value Ref Range Status   SARS Coronavirus 2 by RT PCR NEGATIVE NEGATIVE Final    Comment: (NOTE) SARS-CoV-2 target nucleic acids are NOT DETECTED.  The SARS-CoV-2 RNA is generally detectable in upper respiratory specimens during the acute phase of infection. The lowest concentration of SARS-CoV-2 viral copies this assay can detect is 138 copies/mL. A negative result does not preclude SARS-Cov-2 infection and should not be used as the sole basis for treatment or other patient management decisions. A negative result may occur with  improper specimen collection/handling, submission of specimen other than nasopharyngeal swab, presence of viral mutation(s) within the areas targeted by this assay, and inadequate number of viral copies(<138 copies/mL). A negative result must be combined with clinical observations, patient history, and epidemiological information. The expected result is Negative.  Fact Sheet for Patients:  BloggerCourse.com  Fact Sheet for Healthcare Providers:  SeriousBroker.it  This test is no t yet approved or cleared by the [place]  FDA and  has been authorized for detection and/or diagnosis of SARS-CoV-2 by FDA under an Emergency Use Authorization (EUA). This EUA will remain  in effect (meaning this test can be used) for the duration of the COVID-19 declaration under Section 564(b)(1) of the Act, 21 U.S.C.section 360bbb-3(b)(1), unless the authorization is terminated  or revoked sooner.       Influenza A by  PCR NEGATIVE NEGATIVE Final   Influenza B by PCR NEGATIVE NEGATIVE Final    Comment: (NOTE) The Xpert Xpress SARS-CoV-2/FLU/RSV plus assay is intended as an aid in the diagnosis of influenza from Nasopharyngeal swab specimens and should not be used as a sole basis for treatment. Nasal washings and aspirates are unacceptable for Xpert Xpress SARS-CoV-2/FLU/RSV testing.  Fact Sheet for Patients: BloggerCourse.com  Fact Sheet for Healthcare Providers: SeriousBroker.it  This test is not yet approved or cleared by the [place]  FDA and has been authorized for detection and/or diagnosis of SARS-CoV-2 by FDA under an Emergency Use Authorization (EUA). This EUA will remain in effect (meaning this test can be used) for the duration of the COVID-19 declaration under Section 564(b)(1) of the Act, 21 U.S.C. section 360bbb-3(b)(1), unless the authorization is terminated or revoked.     Resp Syncytial Virus by PCR NEGATIVE NEGATIVE Final    Comment: (NOTE) Fact Sheet for Patients: BloggerCourse.com  Fact Sheet for Healthcare Providers: SeriousBroker.it  This test is not yet approved or cleared by the [place]  FDA and has been authorized for detection and/or diagnosis of SARS-CoV-2 by FDA under an Emergency Use Authorization (EUA). This EUA will remain in effect (meaning this test can be used) for the duration of the COVID-19 declaration under Section 564(b)(1) of the Act, 21 U.S.C. section 360bbb-3(b)(1), unless the authorization is terminated or revoked.  Performed at [facility], [address], [place], [place] [address]     Labs: CBC: Recent Labs  Lab 05/27/24 1144 05/28/24 0456 05/29/24 0502 05/30/24 0424 05/31/24 0218  WBC 19.7* 12.4* 11.1* 9.2 9.1  NEUTROABS 16.8*  --   --   --   --   HGB 12.7 11.2* 10.3* 10.5* 11.0*  HCT 41.2 36.1 33.5* 32.5* 34.1*  MCV 102.0*  105.2* 101.5* 101.2* 101.5*  PLT 320 275 279 286 329   Basic Metabolic Panel: Recent Labs  Lab 05/27/24 1144 05/27/24 1420 05/28/24 0456 05/29/24 0502 05/30/24 0424 05/31/24 0218  NA 133*  --  136 138  133* 135  K 4.4  --  4.1 4.0 4.1 3.9  CL 107  --  108 108 106 101  CO2 17*  --  21* 22 21* 25  GLUCOSE 94  --  74 78 66* 75  BUN 41*  --  34* 22 20 18   CREATININE 1.77*  --  1.10* 0.81 0.83 0.84  CALCIUM  9.6  --  8.8* 8.7* 8.7* 8.9  MG  --  1.8  --   --   --   --   PHOS  --  2.9  --   --   --   --    Liver Function Tests: Recent Labs  Lab 05/27/24 1144  AST 23  ALT 17  ALKPHOS 61  BILITOT 0.4  PROT 7.4  ALBUMIN 2.7*   CBG: Recent Labs  Lab 05/29/24 0733 05/30/24 0754 05/31/24 0731  GLUCAP 88 79 88    Discharge time spent: greater than 30 minutes.  Signed: Bobbetta Burnet, MD [facility] 06/01/2024

## 2024-06-07 ENCOUNTER — Emergency Department (HOSPITAL_COMMUNITY)

## 2024-06-07 ENCOUNTER — Other Ambulatory Visit: Payer: Self-pay

## 2024-06-07 ENCOUNTER — Encounter (HOSPITAL_COMMUNITY): Payer: Self-pay | Admitting: Emergency Medicine

## 2024-06-07 ENCOUNTER — Inpatient Hospital Stay (HOSPITAL_COMMUNITY)
Admission: EM | Admit: 2024-06-07 | Discharge: 2024-07-11 | DRG: 480 | Disposition: A | Discharge status: Skilled Nursing Facility | Attending: Internal Medicine | Admitting: Internal Medicine

## 2024-06-07 DIAGNOSIS — W010XXA Fall on same level from slipping, tripping and stumbling without subsequent striking against object, initial encounter: Secondary | ICD-10-CM | POA: Diagnosis present

## 2024-06-07 DIAGNOSIS — G894 Chronic pain syndrome: Secondary | ICD-10-CM | POA: Diagnosis present

## 2024-06-07 DIAGNOSIS — R339 Retention of urine, unspecified: Secondary | ICD-10-CM | POA: Diagnosis present

## 2024-06-07 DIAGNOSIS — K120 Recurrent oral aphthae: Secondary | ICD-10-CM | POA: Diagnosis present

## 2024-06-07 DIAGNOSIS — R571 Hypovolemic shock: Secondary | ICD-10-CM | POA: Diagnosis not present

## 2024-06-07 DIAGNOSIS — N179 Acute kidney failure, unspecified: Principal | ICD-10-CM | POA: Diagnosis present

## 2024-06-07 DIAGNOSIS — Z720 Tobacco use: Secondary | ICD-10-CM | POA: Diagnosis present

## 2024-06-07 DIAGNOSIS — E43 Unspecified severe protein-calorie malnutrition: Secondary | ICD-10-CM | POA: Diagnosis present

## 2024-06-07 DIAGNOSIS — R578 Other shock: Secondary | ICD-10-CM | POA: Diagnosis not present

## 2024-06-07 DIAGNOSIS — Z7951 Long term (current) use of inhaled steroids: Secondary | ICD-10-CM

## 2024-06-07 DIAGNOSIS — D72829 Elevated white blood cell count, unspecified: Secondary | ICD-10-CM | POA: Diagnosis present

## 2024-06-07 DIAGNOSIS — L89153 Pressure ulcer of sacral region, stage 3: Secondary | ICD-10-CM | POA: Diagnosis present

## 2024-06-07 DIAGNOSIS — Y92009 Unspecified place in unspecified non-institutional (private) residence as the place of occurrence of the external cause: Secondary | ICD-10-CM | POA: Diagnosis not present

## 2024-06-07 DIAGNOSIS — Z933 Colostomy status: Secondary | ICD-10-CM | POA: Diagnosis not present

## 2024-06-07 DIAGNOSIS — I129 Hypertensive chronic kidney disease with stage 1 through stage 4 chronic kidney disease, or unspecified chronic kidney disease: Secondary | ICD-10-CM | POA: Diagnosis present

## 2024-06-07 DIAGNOSIS — J9621 Acute and chronic respiratory failure with hypoxia: Secondary | ICD-10-CM | POA: Diagnosis present

## 2024-06-07 DIAGNOSIS — Z90710 Acquired absence of both cervix and uterus: Secondary | ICD-10-CM

## 2024-06-07 DIAGNOSIS — R682 Dry mouth, unspecified: Secondary | ICD-10-CM | POA: Diagnosis present

## 2024-06-07 DIAGNOSIS — S7292XA Unspecified fracture of left femur, initial encounter for closed fracture: Secondary | ICD-10-CM | POA: Diagnosis present

## 2024-06-07 DIAGNOSIS — A419 Sepsis, unspecified organism: Secondary | ICD-10-CM | POA: Diagnosis not present

## 2024-06-07 DIAGNOSIS — K660 Peritoneal adhesions (postprocedural) (postinfection): Secondary | ICD-10-CM | POA: Diagnosis not present

## 2024-06-07 DIAGNOSIS — I10 Essential (primary) hypertension: Secondary | ICD-10-CM | POA: Diagnosis not present

## 2024-06-07 DIAGNOSIS — K921 Melena: Secondary | ICD-10-CM

## 2024-06-07 DIAGNOSIS — Z7982 Long term (current) use of aspirin: Secondary | ICD-10-CM

## 2024-06-07 DIAGNOSIS — S72002A Fracture of unspecified part of neck of left femur, initial encounter for closed fracture: Principal | ICD-10-CM | POA: Diagnosis present

## 2024-06-07 DIAGNOSIS — Z6841 Body Mass Index (BMI) 40.0 and over, adult: Secondary | ICD-10-CM | POA: Diagnosis not present

## 2024-06-07 DIAGNOSIS — K265 Chronic or unspecified duodenal ulcer with perforation: Secondary | ICD-10-CM | POA: Diagnosis not present

## 2024-06-07 DIAGNOSIS — E16A1 Hypoglycemia level 1: Secondary | ICD-10-CM | POA: Diagnosis present

## 2024-06-07 DIAGNOSIS — Z79890 Hormone replacement therapy (postmenopausal): Secondary | ICD-10-CM

## 2024-06-07 DIAGNOSIS — D696 Thrombocytopenia, unspecified: Secondary | ICD-10-CM | POA: Diagnosis not present

## 2024-06-07 DIAGNOSIS — Z8249 Family history of ischemic heart disease and other diseases of the circulatory system: Secondary | ICD-10-CM

## 2024-06-07 DIAGNOSIS — D62 Acute posthemorrhagic anemia: Secondary | ICD-10-CM | POA: Diagnosis present

## 2024-06-07 DIAGNOSIS — F17210 Nicotine dependence, cigarettes, uncomplicated: Secondary | ICD-10-CM | POA: Diagnosis not present

## 2024-06-07 DIAGNOSIS — Z79899 Other long term (current) drug therapy: Secondary | ICD-10-CM

## 2024-06-07 DIAGNOSIS — K262 Acute duodenal ulcer with both hemorrhage and perforation: Secondary | ICD-10-CM | POA: Diagnosis not present

## 2024-06-07 DIAGNOSIS — K2970 Gastritis, unspecified, without bleeding: Secondary | ICD-10-CM | POA: Diagnosis present

## 2024-06-07 DIAGNOSIS — R6521 Severe sepsis with septic shock: Secondary | ICD-10-CM | POA: Diagnosis not present

## 2024-06-07 DIAGNOSIS — K276 Chronic or unspecified peptic ulcer, site unspecified, with both hemorrhage and perforation: Secondary | ICD-10-CM | POA: Diagnosis present

## 2024-06-07 DIAGNOSIS — N1831 Chronic kidney disease, stage 3a: Secondary | ICD-10-CM | POA: Diagnosis present

## 2024-06-07 DIAGNOSIS — Z8701 Personal history of pneumonia (recurrent): Secondary | ICD-10-CM

## 2024-06-07 DIAGNOSIS — K269 Duodenal ulcer, unspecified as acute or chronic, without hemorrhage or perforation: Secondary | ICD-10-CM | POA: Diagnosis present

## 2024-06-07 DIAGNOSIS — R531 Weakness: Secondary | ICD-10-CM

## 2024-06-07 DIAGNOSIS — Z880 Allergy status to penicillin: Secondary | ICD-10-CM

## 2024-06-07 DIAGNOSIS — D6959 Other secondary thrombocytopenia: Secondary | ICD-10-CM | POA: Diagnosis present

## 2024-06-07 DIAGNOSIS — E162 Hypoglycemia, unspecified: Secondary | ICD-10-CM | POA: Diagnosis present

## 2024-06-07 DIAGNOSIS — M2540 Effusion, unspecified joint: Secondary | ICD-10-CM | POA: Diagnosis present

## 2024-06-07 DIAGNOSIS — E039 Hypothyroidism, unspecified: Secondary | ICD-10-CM | POA: Diagnosis present

## 2024-06-07 DIAGNOSIS — E875 Hyperkalemia: Secondary | ICD-10-CM | POA: Diagnosis present

## 2024-06-07 DIAGNOSIS — K439 Ventral hernia without obstruction or gangrene: Secondary | ICD-10-CM | POA: Diagnosis present

## 2024-06-07 DIAGNOSIS — R5381 Other malaise: Secondary | ICD-10-CM | POA: Diagnosis present

## 2024-06-07 DIAGNOSIS — I872 Venous insufficiency (chronic) (peripheral): Secondary | ICD-10-CM | POA: Diagnosis present

## 2024-06-07 DIAGNOSIS — S72402P Unspecified fracture of lower end of left femur, subsequent encounter for closed fracture with malunion: Secondary | ICD-10-CM | POA: Diagnosis not present

## 2024-06-07 DIAGNOSIS — S72452A Displaced supracondylar fracture without intracondylar extension of lower end of left femur, initial encounter for closed fracture: Secondary | ICD-10-CM | POA: Diagnosis not present

## 2024-06-07 DIAGNOSIS — G4733 Obstructive sleep apnea (adult) (pediatric): Secondary | ICD-10-CM | POA: Diagnosis present

## 2024-06-07 DIAGNOSIS — E669 Obesity, unspecified: Secondary | ICD-10-CM | POA: Diagnosis present

## 2024-06-07 DIAGNOSIS — J9601 Acute respiratory failure with hypoxia: Secondary | ICD-10-CM | POA: Diagnosis present

## 2024-06-07 DIAGNOSIS — M109 Gout, unspecified: Secondary | ICD-10-CM | POA: Diagnosis present

## 2024-06-07 DIAGNOSIS — E66812 Obesity, class 2: Secondary | ICD-10-CM | POA: Diagnosis present

## 2024-06-07 DIAGNOSIS — R32 Unspecified urinary incontinence: Secondary | ICD-10-CM | POA: Diagnosis present

## 2024-06-07 DIAGNOSIS — Z825 Family history of asthma and other chronic lower respiratory diseases: Secondary | ICD-10-CM

## 2024-06-07 DIAGNOSIS — E785 Hyperlipidemia, unspecified: Secondary | ICD-10-CM | POA: Diagnosis present

## 2024-06-07 DIAGNOSIS — J9811 Atelectasis: Secondary | ICD-10-CM | POA: Diagnosis not present

## 2024-06-07 DIAGNOSIS — R1310 Dysphagia, unspecified: Secondary | ICD-10-CM | POA: Diagnosis not present

## 2024-06-07 DIAGNOSIS — J449 Chronic obstructive pulmonary disease, unspecified: Secondary | ICD-10-CM | POA: Diagnosis present

## 2024-06-07 DIAGNOSIS — Z9981 Dependence on supplemental oxygen: Secondary | ICD-10-CM

## 2024-06-07 DIAGNOSIS — Z888 Allergy status to other drugs, medicaments and biological substances status: Secondary | ICD-10-CM

## 2024-06-07 DIAGNOSIS — S72402A Unspecified fracture of lower end of left femur, initial encounter for closed fracture: Secondary | ICD-10-CM | POA: Diagnosis present

## 2024-06-07 DIAGNOSIS — R188 Other ascites: Secondary | ICD-10-CM | POA: Diagnosis present

## 2024-06-07 DIAGNOSIS — Z9049 Acquired absence of other specified parts of digestive tract: Secondary | ICD-10-CM

## 2024-06-07 DIAGNOSIS — Z833 Family history of diabetes mellitus: Secondary | ICD-10-CM

## 2024-06-07 DIAGNOSIS — J984 Other disorders of lung: Secondary | ICD-10-CM | POA: Diagnosis present

## 2024-06-07 DIAGNOSIS — Z91199 Patient's noncompliance with other medical treatment and regimen due to unspecified reason: Secondary | ICD-10-CM

## 2024-06-07 DIAGNOSIS — S72452G Displaced supracondylar fracture without intracondylar extension of lower end of left femur, subsequent encounter for closed fracture with delayed healing: Secondary | ICD-10-CM | POA: Diagnosis not present

## 2024-06-07 DIAGNOSIS — Z716 Tobacco abuse counseling: Secondary | ICD-10-CM

## 2024-06-07 LAB — BLOOD GAS, VENOUS
Acid-Base Excess: 2.4 mmol/L — ABNORMAL HIGH (ref 0.0–2.0)
Bicarbonate: 27.2 mmol/L (ref 20.0–28.0)
Drawn by: 27160
O2 Saturation: 33.5 %
Patient temperature: 36.4
pCO2, Ven: 41 mmHg — ABNORMAL LOW (ref 44–60)
pH, Ven: 7.43 (ref 7.25–7.43)
pO2, Ven: <31 mmHg — CL (ref 32–45)

## 2024-06-07 LAB — CBC WITH DIFFERENTIAL/PLATELET
Abs Immature Granulocytes: 0.31 10*3/uL — ABNORMAL HIGH (ref 0.00–0.07)
Basophils Absolute: 0 10*3/uL (ref 0.0–0.1)
Basophils Relative: 0 %
Eosinophils Absolute: 0.1 10*3/uL (ref 0.0–0.5)
Eosinophils Relative: 1 %
HCT: 31.7 % — ABNORMAL LOW (ref 36.0–46.0)
Hemoglobin: 10.8 g/dL — ABNORMAL LOW (ref 12.0–15.0)
Immature Granulocytes: 2 %
Lymphocytes Relative: 8 %
Lymphs Abs: 1.1 10*3/uL (ref 0.7–4.0)
MCH: 32.9 pg (ref 26.0–34.0)
MCHC: 34.1 g/dL (ref 30.0–36.0)
MCV: 96.6 fL (ref 80.0–100.0)
Monocytes Absolute: 0.6 10*3/uL (ref 0.1–1.0)
Monocytes Relative: 5 %
Neutro Abs: 11.7 10*3/uL — ABNORMAL HIGH (ref 1.7–7.7)
Neutrophils Relative %: 84 %
Platelets: 289 10*3/uL (ref 150–400)
RBC: 3.28 MIL/uL — ABNORMAL LOW (ref 3.87–5.11)
RDW: 15.1 % (ref 11.5–15.5)
WBC: 13.8 10*3/uL — ABNORMAL HIGH (ref 4.0–10.5)
nRBC: 0 % (ref 0.0–0.2)

## 2024-06-07 LAB — URINALYSIS, ROUTINE W REFLEX MICROSCOPIC
Bilirubin Urine: NEGATIVE
Glucose, UA: NEGATIVE mg/dL
Hgb urine dipstick: NEGATIVE
Ketones, ur: NEGATIVE mg/dL
Leukocytes,Ua: NEGATIVE
Nitrite: NEGATIVE
Protein, ur: NEGATIVE mg/dL
Specific Gravity, Urine: 1.015 (ref 1.005–1.030)
pH: 5 (ref 5.0–8.0)

## 2024-06-07 LAB — COMPREHENSIVE METABOLIC PANEL WITH GFR
ALT: 26 U/L (ref 0–44)
AST: 52 U/L — ABNORMAL HIGH (ref 15–41)
Albumin: 2 g/dL — ABNORMAL LOW (ref 3.5–5.0)
Alkaline Phosphatase: 58 U/L (ref 38–126)
Anion gap: 15 (ref 5–15)
BUN: 117 mg/dL — ABNORMAL HIGH (ref 8–23)
CO2: 22 mmol/L (ref 22–32)
Calcium: 8.5 mg/dL — ABNORMAL LOW (ref 8.9–10.3)
Chloride: 96 mmol/L — ABNORMAL LOW (ref 98–111)
Creatinine, Ser: 4.5 mg/dL — ABNORMAL HIGH (ref 0.44–1.00)
GFR, Estimated: 10 mL/min — ABNORMAL LOW (ref >60)
Glucose, Bld: 66 mg/dL — ABNORMAL LOW (ref 70–99)
Potassium: 5.3 mmol/L — ABNORMAL HIGH (ref 3.5–5.1)
Sodium: 133 mmol/L — ABNORMAL LOW (ref 135–145)
Total Bilirubin: 0.9 mg/dL (ref 0.0–1.2)
Total Protein: 6.2 g/dL — ABNORMAL LOW (ref 6.5–8.1)

## 2024-06-07 LAB — BRAIN NATRIURETIC PEPTIDE: B Natriuretic Peptide: 36 pg/mL (ref 0.0–100.0)

## 2024-06-07 LAB — MAGNESIUM: Magnesium: 2.1 mg/dL (ref 1.7–2.4)

## 2024-06-07 MED ORDER — HEPARIN SODIUM (PORCINE) 5000 UNIT/ML IJ SOLN
5000.0000 [IU] | Freq: Three times a day (TID) | INTRAMUSCULAR | Status: AC
Start: 2024-06-07 — End: 2024-06-11
  Administered 2024-06-07 – 2024-06-10 (×7): 5000 [IU] via SUBCUTANEOUS
  Filled 2024-06-07 (×13): qty 1

## 2024-06-07 MED ORDER — DEXTROSE 50 % IV SOLN
25.0000 mL | Freq: Once | INTRAVENOUS | Status: AC
  Administered 2024-06-07: 25 mL via INTRAVENOUS
  Filled 2024-06-07: qty 50

## 2024-06-07 MED ORDER — HYDROMORPHONE HCL 1 MG/ML IJ SOLN
0.5000 mg | INTRAMUSCULAR | Status: DC | PRN
  Administered 2024-06-09: 0.5 mg via INTRAVENOUS
  Administered 2024-06-09: 1 mg via INTRAVENOUS
  Administered 2024-06-10 (×2): 0.5 mg via INTRAVENOUS
  Administered 2024-06-12 (×2): 1 mg via INTRAVENOUS
  Administered 2024-06-12: 0.5 mg via INTRAVENOUS
  Administered 2024-06-13 – 2024-06-15 (×4): 1 mg via INTRAVENOUS
  Filled 2024-06-07: qty 0.5
  Filled 2024-06-07: qty 1
  Filled 2024-06-07: qty 0.5
  Filled 2024-06-07 (×2): qty 1
  Filled 2024-06-07 (×2): qty 0.5
  Filled 2024-06-07 (×3): qty 1
  Filled 2024-06-07 (×2): qty 0.5
  Filled 2024-06-07 (×2): qty 1
  Filled 2024-06-07: qty 0.5
  Filled 2024-06-07: qty 1

## 2024-06-07 MED ORDER — SODIUM CHLORIDE 0.9 % IV BOLUS
1000.0000 mL | Freq: Once | INTRAVENOUS | Status: AC
  Administered 2024-06-07: 1000 mL via INTRAVENOUS

## 2024-06-07 MED ORDER — SODIUM CHLORIDE 0.9 % IV SOLN: INTRAVENOUS | Status: DC

## 2024-06-07 MED ORDER — HYDROMORPHONE HCL 1 MG/ML IJ SOLN: 1.0000 mg | Freq: Once | INTRAMUSCULAR | Status: DC

## 2024-06-07 MED ORDER — ONDANSETRON HCL 4 MG/2ML IJ SOLN
4.0000 mg | Freq: Four times a day (QID) | INTRAMUSCULAR | Status: DC | PRN
  Administered 2024-06-09 – 2024-07-05 (×11): 4 mg via INTRAVENOUS
  Filled 2024-06-07 (×12): qty 2

## 2024-06-07 MED ORDER — OXYCODONE HCL 5 MG PO TABS
5.0000 mg | ORAL_TABLET | ORAL | Status: DC | PRN
  Administered 2024-06-07 – 2024-06-15 (×7): 5 mg via ORAL
  Filled 2024-06-07 (×9): qty 1

## 2024-06-07 MED ORDER — ACETAMINOPHEN 650 MG RE SUPP: 650.0000 mg | Freq: Four times a day (QID) | RECTAL | Status: DC | PRN

## 2024-06-07 MED ORDER — ONDANSETRON HCL 4 MG PO TABS
4.0000 mg | ORAL_TABLET | Freq: Four times a day (QID) | ORAL | Status: DC | PRN
  Filled 2024-06-07: qty 1

## 2024-06-07 MED ORDER — ACETAMINOPHEN 325 MG PO TABS
650.0000 mg | ORAL_TABLET | Freq: Four times a day (QID) | ORAL | Status: DC | PRN
  Administered 2024-06-09 – 2024-06-14 (×2): 650 mg via ORAL
  Filled 2024-06-07 (×4): qty 2

## 2024-06-07 NOTE — H&P (Addendum)
 History and Physical    Christina Castaneda  MWU:[id number]  DOB: 1956/09/04  DOA: 06/07/2024 PCP: Malka Sea, DO   Patient coming from: home  Chief Complaint: Fall  HPI: Christina Castaneda is a 68 y.o. female with medical history of nicotine abuse, HTN, hypothyroidism who fell while trying to sit on her toilet at home in the middle of the night on Tuesday.  She was not able to get up and EMS was called.  She was transferred from the bathroom to her bed by EMS who then left her at home.  Since that date she has not been able to stand or ambulate. She was just discharged from the hospital on June 8.  She states she had a touch of pneumonia at that time.  In the ED, she was found to have a left femoral fracture and AKI. Upon further questioning in regards to her AKI, she states that she occasionally takes aspirin and took maybe 4 doses of aspirin since her fall on Tuesday.  Has not taken any other NSAIDs.  She thinks she has been drinking enough fluid but is not sure.  She has been wearing a urine incontinence pad since her fall and she thinks she urinated earlier today prior to coming in the hospital.  No hematuria.  No flank pain.  No prior history of renal issues.   ED Course: The patient received 3 L of normal saline-as mentioned above, there has not been any urine output. CT scan reveals a left distal fracture of the femoral shaft She was given a milligram of IV Dilaudid .  Review of Systems:  Has had generalized weakness. All other systems reviewed and apart from HPI, are negative.  Past Medical History:  Diagnosis Date   Asthma    mild   Atypical endometrial hyperplasia    COPD (chronic obstructive pulmonary disease) (HCC)    Goiter    Gout    Heart murmur    Hypertension    Hypothyroidism    Vitamin B12 deficiency    Yeast infection    for last 3 weeks    Past Surgical History:  Procedure Laterality Date   ABDOMINAL WALL DEFECT REPAIR N/A 04/18/2016   Procedure:  CLOSURE OF ABDOMEN;  Surgeon: Sim Dryer, MD;  Location: [facility] OR;  Service: General;  Laterality: N/A;   APPENDECTOMY     APPLICATION OF WOUND VAC N/A 04/02/2016   Procedure: application of wound vac+;  Surgeon: Aldean Hummingbird, MD;  Location: [facility] OR;  Service: General;  Laterality: N/A;   CHOLECYSTECTOMY  2009   gallstone removed   COLOSTOMY N/A 03/30/2016   Procedure: COLOSTOMY;  Surgeon: Enid Harry, MD;  Location: [facility] OR;  Service: General;  Laterality: N/A;   COLOSTOMY REVISION N/A 03/28/2016   Procedure: COLOSTOMY REVISION;  Surgeon: Jerryl Morin, MD;  Location: [facility] OR;  Service: General;  Laterality: N/A;   COLOSTOMY REVISION N/A 03/30/2016   Procedure: COLON RESECTION LEFT;  Surgeon: Enid Harry, MD;  Location: [facility] OR;  Service: General;  Laterality: N/A;   LAPAROTOMY N/A 03/28/2016   Procedure: EXPLORATORY LAPAROTOMY;  Surgeon: Jerryl Morin, MD;  Location: [facility] OR;  Service: General;  Laterality: N/A;   LAPAROTOMY N/A 04/02/2016   Procedure: Re-exploration of open abdomen, application of abdominal wound vac;  Surgeon: Aldean Hummingbird, MD;  Location: [facility] OR;  Service: General;  Laterality: N/A;   LAPAROTOMY N/A 04/04/2016   Procedure: EXPLORATORY LAPAROTOMY, PLACEMENT OF ABRA ABDOMINAL WALL CLOSURE SET ;  Surgeon: Lyell Samuel  Elvan Hamel, MD;  Location: [facility] OR;  Service: General;  Laterality: N/A;   LAPAROTOMY N/A 04/18/2016   Procedure: EXPLORATORY LAPAROTOMY;  Surgeon: Sim Dryer, MD;  Location: [facility] OR;  Service: General;  Laterality: N/A;   NECK SURGERY  1994   repair disk   OMENTECTOMY N/A 03/28/2016   Procedure: OMENTECTOMY;  Surgeon: Jerryl Morin, MD;  Location: [facility] OR;  Service: General;  Laterality: N/A;   ROBOTIC ASSISTED TOTAL HYSTERECTOMY WITH BILATERAL SALPINGO OOPHERECTOMY  11/18/2012   Procedure: ROBOTIC ASSISTED TOTAL HYSTERECTOMY WITH BILATERAL SALPINGO OOPHORECTOMY;  Surgeon: Daryel Ensign A. Clerance Dais, MD;  Location: [facility] ORS;  Service: Gynecology;  Laterality: N/A;   TONSILLECTOMY     as a child    VACUUM ASSISTED CLOSURE CHANGE N/A 03/30/2016   Procedure: ABDOMINAL VAC CHANGE;  Surgeon: Enid Harry, MD;  Location: [facility] OR;  Service: General;  Laterality: N/A;   WOUND DEBRIDEMENT N/A 03/28/2016   Procedure: DEBRIDEMENT WOUND;  Surgeon: Jerryl Morin, MD;  Location: [facility] OR;  Service: General;  Laterality: N/A;    Social History:   reports that she has been smoking cigarettes. She has a 42 pack-year smoking history. She has never used smokeless tobacco. She reports current alcohol use of about 1.0 - 3.0 standard drink of alcohol per week. She reports that she does not use drugs.  Allergies  Allergen Reactions   Penicillins Anaphylaxis, Hives, Shortness Of Breath and Swelling    Pt given ceftriaxone  05/27/24 in ED and tolerated   Biphosphate Other (See Comments)    Aching joints   Fluticasone  Other (See Comments)    Headaches    Family History  Problem Relation Age of Onset   COPD Mother    Diabetes Mother    Congestive Heart Failure Mother    Diabetes Father    Heart attack Father      Prior to Admission medications   Medication Sig Start Date End Date Taking? Authorizing Provider  acetaminophen  (TYLENOL ) 500 MG tablet Take 1,000 mg by mouth every 6 (six) hours.    [provider]  albuterol  (VENTOLIN  HFA) 108 (90 Base) MCG/ACT inhaler Inhale 2 puffs into the lungs every 6 (six) hours as needed. Wheezing and shortness of breath 05/31/24 07/30/24  Bobbetta Burnet, MD  allopurinol  (ZYLOPRIM ) 300 MG tablet Take 300 mg by mouth daily.    [provider]  Cyanocobalamin (VITAMIN B-12 CR PO) Take 1 tablet by mouth daily.    [provider]  Ergocalciferol  50 MCG (2000 UT) TABS Take 2,000 Units by mouth daily.    [provider]  fluticasone -salmeterol (ADVAIR) 500-50 MCG/ACT AEPB Inhale 1 puff into the lungs in the morning and at bedtime. 05/31/24 06/30/24  ShahmehdiConstantino Demark, MD  gabapentin  (NEURONTIN ) 300 MG capsule Take 1-2 capsules by mouth at bedtime.  02/27/21   [provider]  levothyroxine  (SYNTHROID ) 137 MCG tablet Take 137 mcg by mouth daily.    [provider]  nystatin  (MYCOSTATIN /NYSTOP ) powder Apply topically 3 (three) times daily. 05/31/24   Shahmehdi, Constantino Demark, MD  rosuvastatin  (CRESTOR ) 10 MG tablet Take 10 mg by mouth daily.    [provider]  senna-docusate (SENOKOT-S) 8.6-50 MG tablet Take 2 tablets by mouth at bedtime. 05/31/24 06/30/24  Bobbetta Burnet, MD    Physical Exam: Wt Readings from Last 3 Encounters:  06/07/24 105 kg  05/31/24 104.4 kg  02/19/24 108.9 kg   Vitals:   06/07/24 1700 06/07/24 1702 06/07/24 1704 06/07/24 1930  BP:  (!) 97/55  109/71 122/61  Pulse:  81  79  Resp:  17  18  Temp:  (!) 97.5 F (36.4 C)    TempSrc:  Temporal    SpO2:  (!) 87% 91%   Weight: 105 kg     Height: 5' 5 (1.651 m)         Constitutional:  Calm & comfortable Eyes: PERRLA, lids and conjunctivae normal ENT:  Mucous membranes are dry Pharynx clear of exudate   Normal dentition.  Respiratory:  Clear to auscultation bilaterally  Normal respiratory effort.  Cardiovascular:  S1 & S2 heard, regular rate and rhythm No Murmurs Abdomen:  Non distended No tenderness, No masses Colostomy present Abdominal hernia present which is reducible Bowel sounds normal Extremities:  No clubbing / cyanosis No pedal edema  Skin:  No rashes, lesions or ulcers Neurologic:  AAO x 3 CN 2-12 grossly intact Sensation intact Strength 5/5 in all 4 extremities Psychiatric:  Normal Mood and affect    Labs on Admission: I have personally reviewed labs and imaging studies   EKG: I have ordered an EKG and it has not yet been completed  Assessment/Plan Principal Problem: Left femur fracture - Acute comminuted fracture of distal femur with moderate to large joint effusion - She likely obtained this fracture on Tuesday night when she fell while trying to get on the commode - High risk for DVT as she has  not been out of bed since fall on Tuesday- start Heparin  prophylaxis until surgery can be performed - I have spoken with Dr. Lajune Piles ([facility]) who is on-call for orthopedic surgery- Ok with admission to [facility] operative repair when renal function improves  Active Problems:   AKI, hyperkalemia in setting of ACE inhibitor use - baseline Cr 0.8- -Current BUN 117 with a creatinine of 4.50 and a potassium of 5.3 -Suspect prerenal-she does not admit to poor oral intake but notes from last admission state that her oral intake was poor  -Has taken about 4 doses of aspirin this week and no other NSAIDs - She takes Benicar  daily which will be held - She has been in the ED a little over 3 hours and has not had any urine output despite receiving 3 L of normal saline - Foley placed for accurate input/output -Obtain random urine sodium and creatinine to calculate a FeNa - Continue normal saline at 75 cc an hour and allow her to drink fluids as well Addendum: She has had 250 cc of urine output so far  Mild hypoglycemia - Glucose 66-follow  Acute hypoxic respiratory failure - Pulse ox is 88 to 89%-EMS placed 2 L of oxygen on her - The patient does not use oxygen at home however was using oxygen when she was admitted last week - Portable chest x-ray that was performed earlier shows left basilar consolidation - Suspect she has atelectasis secondary to not being out of bed for days and this is likely superimposed on COPD    COPD-ongoing cigarette use - The patient states that she has never been told that she has emphysema or COPD and continues to smoke about half to 3/4 pack/day - She has seen [facility] in the past, specifically Dr. Waymond Hailey and is maintained on Advair and albuterol  - She declines a nicotine patch - Smoking cessation counseling done  Leukocytosis - Acute stress response?  Recently was treated for pneumonia but overall no signs of infection at this time - UA  negative - Follow-up WBC count in  a.m.    OSA (obstructive sleep apnea) - CPAP ordered  Colostomy with history of colocutaneous fistula - She is able to manage her colostomy herself    Obesity Estimated body mass index is 38.52 kg/m as calculated from the following:   Height as of this encounter: 5' 5 (1.651 m).   Weight as of this encounter: 105 kg.      DVT prophylaxis: Heparin   Code Status: Full code  Consults called: Ortho, Dr Donneta Gaines  Admission status: Inpatient Level of care: Telemetry Medical  Sedalia Dacosta MD [facility]    06/07/2024, 8:08 PM

## 2024-06-07 NOTE — Progress Notes (Signed)
 Patient with right distal femur fracture after fall last Tuesday. She has been immobile since Tuesday and has developed severe AKI.Spoke with hospitalist who [facility] given severity of her kidney injury. Once adequately resuscitated, plan to proceed to OR for femur fixation likely later in the week. Currently immobilization with KI, plan for full consult note once transferred to [facility].

## 2024-06-07 NOTE — ED Notes (Signed)
 [facility] was called for transport. Christina Castaneda

## 2024-06-07 NOTE — ED Notes (Signed)
 Patient given a warm blanket and lights turned out for the patient to rest.

## 2024-06-07 NOTE — ED Provider Notes (Signed)
 [place] EMERGENCY DEPARTMENT AT [facility] Provider Note   CSN: [id number] Arrival date & time: 06/07/24  1656     Patient presents with: Christina Castaneda is a 68 y.o. female.  {Add pertinent medical, surgical, social history, OB history to GMW:10272} Patient with bad COPD.  She was recently admitted to the hospital and was discharged last week.  Patient states she fell Tuesday and has been in the bed ever since then   Fall       Prior to Admission medications   Medication Sig Start Date End Date Taking? Authorizing Provider  acetaminophen  (TYLENOL ) 500 MG tablet Take 1,000 mg by mouth every 6 (six) hours.    [provider]  albuterol  (VENTOLIN  HFA) 108 (90 Base) MCG/ACT inhaler Inhale 2 puffs into the lungs every 6 (six) hours as needed. Wheezing and shortness of breath 05/31/24 07/30/24  Bobbetta Burnet, MD  allopurinol  (ZYLOPRIM ) 300 MG tablet Take 300 mg by mouth daily.    [provider]  Cyanocobalamin (VITAMIN B-12 CR PO) Take 1 tablet by mouth daily.    [provider]  Ergocalciferol  50 MCG (2000 UT) TABS Take 2,000 Units by mouth daily.    [provider]  fluticasone -salmeterol (ADVAIR) 500-50 MCG/ACT AEPB Inhale 1 puff into the lungs in the morning and at bedtime. 05/31/24 06/30/24  Bobbetta Burnet, MD  gabapentin  (NEURONTIN ) 300 MG capsule Take 1-2 capsules by mouth at bedtime. 02/27/21   [provider]  levothyroxine  (SYNTHROID ) 137 MCG tablet Take 137 mcg by mouth daily.    [provider]  nystatin  (MYCOSTATIN /NYSTOP ) powder Apply topically 3 (three) times daily. 05/31/24   Shahmehdi, Constantino Demark, MD  rosuvastatin  (CRESTOR ) 10 MG tablet Take 10 mg by mouth daily.    [provider]  senna-docusate (SENOKOT-S) 8.6-50 MG tablet Take 2 tablets by mouth at bedtime. 05/31/24 06/30/24  Bobbetta Burnet, MD    Allergies: Penicillins, Biphosphate, and Fluticasone     Review of Systems  Updated  Vital Signs BP 109/71   Pulse 81   Temp (!) 97.5 F (36.4 C) (Temporal)   Resp 17   Ht 5' 5 (1.651 m)   Wt 105 kg   LMP 11/18/2012   SpO2 91%   BMI 38.52 kg/m   Physical Exam  (all labs ordered are listed, but only abnormal results are displayed) Labs Reviewed  CBC WITH DIFFERENTIAL/PLATELET - Abnormal; Notable for the following components:      Result Value   WBC 13.8 (*)    RBC 3.28 (*)    Hemoglobin 10.8 (*)    HCT 31.7 (*)    Neutro Abs 11.7 (*)    Abs Immature Granulocytes 0.31 (*)    All other components within normal limits  COMPREHENSIVE METABOLIC PANEL WITH GFR - Abnormal; Notable for the following components:   Sodium 133 (*)    Potassium 5.3 (*)    Chloride 96 (*)    Glucose, Bld 66 (*)    BUN 117 (*)    Creatinine, Ser 4.50 (*)    Calcium  8.5 (*)    Total Protein 6.2 (*)    Albumin 2.0 (*)    AST 52 (*)    GFR, Estimated 10 (*)    All other components within normal limits  BLOOD GAS, VENOUS - Abnormal; Notable for the following components:   pCO2, Ven 41 (*)    pO2, Ven <31 (*)    Acid-Base Excess 2.4 (*)  All other components within normal limits  BRAIN NATRIURETIC PEPTIDE  URINALYSIS, ROUTINE W REFLEX MICROSCOPIC    EKG: None  Radiology: DG Chest Port 1 View Result Date: 06/07/2024 CLINICAL DATA:  Short of breath, recent fall EXAM: PORTABLE CHEST 1 VIEW COMPARISON:  05/27/2024 FINDINGS: Single frontal view of the chest demonstrates a stable cardiac silhouette. Patchy left basilar consolidation and small left pleural effusion concerning for underlying pneumonia or aspiration. Right chest is clear. Right chest is clear. No pneumothorax. No acute bony abnormalities. IMPRESSION: 1. Persistent left basilar consolidation and small left pleural effusion, concerning for underlying infection or aspiration. Electronically Signed   By: Bobbye Burrow M.D.   On: 06/07/2024 18:53    {Document cardiac monitor, telemetry assessment procedure when  appropriate:32947} Procedures   Medications Ordered in the ED  sodium chloride  0.9 % bolus 1,000 mL (0 mLs Intravenous Stopped 06/07/24 1835)  sodium chloride  0.9 % bolus 1,000 mL (1,000 mLs Intravenous New Bag/Given 06/07/24 1725)  sodium chloride  0.9 % bolus 1,000 mL (1,000 mLs Intravenous New Bag/Given 06/07/24 1841)  dextrose  50 % solution 25 mL (25 mLs Intravenous Given 06/07/24 1839)   I spoke with Dr. [facility] orthopedics and he requested the patient be admitted to [facility] by the hospitalist.  He states he will do the surgery either tomorrow or on Tuesday   {Click here for ABCD2, HEART and other calculators REFRESH Note before signing:1}                              Medical Decision Making Amount and/or Complexity of Data Reviewed Labs: ordered. Radiology: ordered.  Risk Prescription drug management. Decision regarding hospitalization.   Severe AKI and distal femur fracture on the left.  Patient admitted to medicine with orthopedic following the patient and will do surgery to repair femur  {Document critical care time when appropriate  Document review of labs and clinical decision tools ie [id number], etc  Document your independent review of radiology images and any outside records  Document your discussion with family members, caretakers and with consultants  Document social determinants of health affecting pt's care  Document your decision making why or why not admission, treatments were needed:32947:::1}   Final diagnoses:  AKI (acute kidney injury) (HCC)  Closed fracture of distal end of left femur with malunion, unspecified fracture morphology, subsequent encounter    ED Discharge Orders     None

## 2024-06-07 NOTE — ED Triage Notes (Signed)
 Pt c/o left knee pain since Fall Wednesday at home. Pt states she has not been ambulating since fall. Pt's o2 at home was low 90s/upper 80s so ems placed pt on 2lpm of o2.

## 2024-06-08 ENCOUNTER — Encounter (HOSPITAL_COMMUNITY)
Admission: EM | Disposition: A | Discharge status: Skilled Nursing Facility | Payer: Self-pay | Source: Home / Self Care | Attending: Internal Medicine

## 2024-06-08 ENCOUNTER — Inpatient Hospital Stay (HOSPITAL_COMMUNITY)

## 2024-06-08 DIAGNOSIS — S7292XA Unspecified fracture of left femur, initial encounter for closed fracture: Secondary | ICD-10-CM | POA: Diagnosis not present

## 2024-06-08 LAB — CBC
HCT: 29.7 % — ABNORMAL LOW (ref 36.0–46.0)
Hemoglobin: 9.4 g/dL — ABNORMAL LOW (ref 12.0–15.0)
MCH: 31.6 pg (ref 26.0–34.0)
MCHC: 31.6 g/dL (ref 30.0–36.0)
MCV: 100 fL (ref 80.0–100.0)
Platelets: 251 10*3/uL (ref 150–400)
RBC: 2.97 MIL/uL — ABNORMAL LOW (ref 3.87–5.11)
RDW: 15.5 % (ref 11.5–15.5)
WBC: 13.3 10*3/uL — ABNORMAL HIGH (ref 4.0–10.5)
nRBC: 0 % (ref 0.0–0.2)

## 2024-06-08 LAB — BASIC METABOLIC PANEL WITH GFR
Anion gap: 12 (ref 5–15)
BUN: 100 mg/dL — ABNORMAL HIGH (ref 8–23)
CO2: 17 mmol/L — ABNORMAL LOW (ref 22–32)
Calcium: 7.6 mg/dL — ABNORMAL LOW (ref 8.9–10.3)
Chloride: 107 mmol/L (ref 98–111)
Creatinine, Ser: 3.53 mg/dL — ABNORMAL HIGH (ref 0.44–1.00)
GFR, Estimated: 13 mL/min — ABNORMAL LOW (ref >60)
Glucose, Bld: 58 mg/dL — ABNORMAL LOW (ref 70–99)
Potassium: 4.4 mmol/L (ref 3.5–5.1)
Sodium: 136 mmol/L (ref 135–145)

## 2024-06-08 LAB — SODIUM, URINE, RANDOM: Sodium, Ur: 20 mmol/L

## 2024-06-08 LAB — GLUCOSE, CAPILLARY
Glucose-Capillary: 74 mg/dL (ref 70–99)
Glucose-Capillary: 95 mg/dL (ref 70–99)

## 2024-06-08 LAB — CK: Total CK: 52 U/L (ref 38–234)

## 2024-06-08 LAB — CBG MONITORING, ED: Glucose-Capillary: 78 mg/dL (ref 70–99)

## 2024-06-08 SURGERY — OPEN REDUCTION INTERNAL FIXATION (ORIF) DISTAL FEMUR FRACTURE
Anesthesia: General | Laterality: Left

## 2024-06-08 MED ORDER — SODIUM CHLORIDE 0.9 % IV SOLN: INTRAVENOUS | Status: DC

## 2024-06-08 MED ORDER — CHLORHEXIDINE GLUCONATE CLOTH 2 % EX PADS
6.0000 | MEDICATED_PAD | Freq: Every day | CUTANEOUS | Status: DC
  Administered 2024-06-09 – 2024-07-11 (×31): 6 via TOPICAL

## 2024-06-08 MED ORDER — NYSTATIN 100000 UNIT/GM EX POWD
Freq: Two times a day (BID) | CUTANEOUS | Status: DC
  Administered 2024-06-13 – 2024-06-21 (×2): 1 via TOPICAL
  Filled 2024-06-08 (×7): qty 15

## 2024-06-08 NOTE — Progress Notes (Signed)
   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 TOC following.

## 2024-06-08 NOTE — Progress Notes (Signed)
 PROGRESS NOTE    Christina Castaneda  NWG:[id number] DOB: Apr 29, 1956 DOA: 06/07/2024 PCP: Malka Sea, DO   Brief Narrative:    Christina Castaneda is a 69 y.o. female with medical history of nicotine abuse, HTN, hypothyroidism who fell while trying to sit on her toilet at home in the middle of the night on Tuesday.  She was not able to get up and EMS was called.  She was transferred from the bathroom to her bed by EMS who then left her at home.  Since that date she has not been able to stand or ambulate. She was just discharged from the hospital on June 8.  She states she had a touch of pneumonia at that time.  Patient had imaging studies demonstrating acute comminuted fracture of the distal left femur and she is noted to have severe AKI with hyperkalemia as well likely prerenal in the setting of poor oral intake along with use of ACE inhibitor.  Plans are for likely orthopedic intervention by 6/19 once AKI further improves.  Assessment & Plan:   Principal Problem:   Femur fracture, left (HCC) Active Problems:   AKI (acute kidney injury) (HCC)   Hypothyroid   COPD  GOLD 2/AB   OSA (obstructive sleep apnea)   Obesity   Essential hypertension   Generalized weakness   S/P colostomy (HCC)   Cigarette smoker  Assessment and Plan:   Left femur fracture - Acute comminuted fracture of distal femur with moderate to large joint effusion - She likely obtained this fracture on Tuesday night when she fell while trying to get on the commode - High risk for DVT as she has not been out of bed since fall on Tuesday- start Heparin  prophylaxis until surgery can be performed - Patient okay to remain at [facility].  Spoke with Dr. Ernesta Heading who is agreeable for operative intervention here by 6/19 once AKI further improves.    AKI, hyperkalemia in setting of ACE inhibitor use-improving - baseline Cr 0.8- -Current BUN 117 with a creatinine of 4.50 and a potassium of 5.3 -Suspect prerenal-she does  not admit to poor oral intake but notes from last admission state that her oral intake was poor  -Has taken about 4 doses of aspirin this week and no other NSAIDs - She takes Benicar  daily which will be held - She has been in the ED a little over 3 hours and has not had any urine output despite receiving 3 L of normal saline - Foley placed for accurate input/output -CK is not elevated -Obtain random urine sodium and creatinine to calculate a FeNa - Continue normal saline at 100 cc/h and continue diet   Mild hypoglycemia - Continues to remain persistent, check CBGs with current diet   Acute hypoxic respiratory failure - Pulse ox is 88 to 89%-EMS placed 2 L of oxygen on her - The patient does wear oxygen at home during nighttime - Portable chest x-ray that was performed earlier shows left basilar consolidation - Suspect she has atelectasis secondary to not being out of bed for days and this is likely superimposed on COPD     COPD-ongoing cigarette use - The patient states that she has never been told that she has emphysema or COPD and continues to smoke about half to 3/4 pack/day - She has seen [place] in the past, specifically Dr. Waymond Hailey and is maintained on Advair and albuterol  - She declines a nicotine patch - Smoking cessation counseling done  Leukocytosis - Acute stress response?  Recently was treated for pneumonia but overall no signs of infection at this time - UA negative - Follow-up WBC count in a.m.     OSA (obstructive sleep apnea) - CPAP ordered   Colostomy with history of colocutaneous fistula - She is able to manage her colostomy herself     Obesity Estimated body mass index is 38.52 kg/m as calculated from the following:   Height as of this encounter: 5' 5 (1.651 m).   Weight as of this encounter: 105 kg.    DVT prophylaxis: Heparin  Code Status: Full Family Communication: None at bedside Disposition Plan:  Status is: Inpatient Remains inpatient  appropriate because: Need for IV medications and inpatient procedure  Skin Assessment:  I have examined the patient's skin and I agree with the wound assessment as performed by the wound care RN as outlined below:  Pressure Injury 05/27/24 Sacrum Stage 2 -  Partial thickness loss of dermis presenting as a shallow open injury with a red, pink wound bed without slough. 4 small stage II present, each measures 0.4x 0.4 x 0.2 (Active)  05/27/24 1815  Location: Sacrum  Location Orientation:   Staging: Stage 2 -  Partial thickness loss of dermis presenting as a shallow open injury with a red, pink wound bed without slough.  Wound Description (Comments): 4 small stage II present, each measures 0.4x 0.4 x 0.2  DO NOT USE:  Present on Admission:   Dressing Type Foam - Lift dressing to assess site every shift 05/31/24 0855    Consultants:  Dr. Ernesta Heading orthopedics  Procedures:  None  Antimicrobials:  None   Subjective: Patient seen and evaluated today with no new acute complaints or concerns. No acute concerns or events noted overnight.  She denies any significant pain.  Objective: Vitals:   06/08/24 0600 06/08/24 0630 06/08/24 0930 06/08/24 0935  BP: 107/61 127/61 107/82   Pulse: 71 64 78   Resp: 16 18 17    Temp:    97.8 F (36.6 C)  TempSrc:    Oral  SpO2: 95% 93% 93%   Weight:      Height:        Intake/Output Summary (Last 24 hours) at 06/08/2024 1022 Last data filed at 06/08/2024 0536 Gross per 24 hour  Intake 1100 ml  Output 600 ml  Net 500 ml   Filed Weights   06/07/24 1700  Weight: 105 kg    Examination:  General exam: Appears calm and comfortable, obese Respiratory system: Clear to auscultation. Respiratory effort normal.  Currently on 2 L nasal cannula Cardiovascular system: S1 & S2 heard, RRR.  Gastrointestinal system: Abdomen is soft Central nervous system: Alert and awake Extremities: No edema Skin: No significant lesions noted Psychiatry: Flat  affect.    Data Reviewed: I have personally reviewed following labs and imaging studies  CBC: Recent Labs  Lab 06/07/24 1710 06/08/24 0638  WBC 13.8* 13.3*  NEUTROABS 11.7*  --   HGB 10.8* 9.4*  HCT 31.7* 29.7*  MCV 96.6 100.0  PLT 289 251   Basic Metabolic Panel: Recent Labs  Lab 06/07/24 1710 06/08/24 0517  NA 133* 136  K 5.3* 4.4  CL 96* 107  CO2 22 17*  GLUCOSE 66* 58*  BUN 117* 100*  CREATININE 4.50* 3.53*  CALCIUM  8.5* 7.6*  MG 2.1  --    GFR: Estimated Creatinine Clearance: 17.8 mL/min (A) (by C-G formula based on SCr of 3.53 mg/dL (H)). Liver Function Tests:  Recent Labs  Lab 06/07/24 1710  AST 52*  ALT 26  ALKPHOS 58  BILITOT 0.9  PROT 6.2*  ALBUMIN 2.0*   No results for input(s): LIPASE, AMYLASE in the last 168 hours. No results for input(s): AMMONIA in the last 168 hours. Coagulation Profile: No results for input(s): INR, PROTIME in the last 168 hours. Cardiac Enzymes: Recent Labs  Lab 06/08/24 0638  CKTOTAL 52   BNP (last 3 results) No results for input(s): PROBNP in the last 8760 hours. HbA1C: No results for input(s): HGBA1C in the last 72 hours. CBG: Recent Labs  Lab 06/08/24 0950  GLUCAP 78   Lipid Profile: No results for input(s): CHOL, HDL, LDLCALC, TRIG, CHOLHDL, LDLDIRECT in the last 72 hours. Thyroid  Function Tests: No results for input(s): TSH, T4TOTAL, FREET4, T3FREE, THYROIDAB in the last 72 hours. Anemia Panel: No results for input(s): VITAMINB12, FOLATE, FERRITIN, TIBC, IRON, RETICCTPCT in the last 72 hours. Sepsis Labs: No results for input(s): PROCALCITON, LATICACIDVEN in the last 168 hours.  No results found for this or any previous visit (from the past 240 hours).       Radiology Studies: US  RENAL Result Date: 06/08/2024 CLINICAL DATA:  Acute kidney injury EXAM: RENAL / URINARY TRACT ULTRASOUND COMPLETE COMPARISON:  None Available. FINDINGS: Right Kidney:  Length = 11.9 cm AP renal pelvis diameter = <10 mm Duplex configuration. Normal parenchymal echogenicity with preserved corticomedullary differentiation. No urinary tract dilation or shadowing calculi. The ureter is not seen. Left Kidney: Length = 11.4 cm AP renal pelvis diameter = <10 mm Normal parenchymal echogenicity with preserved corticomedullary differentiation. No urinary tract dilation or shadowing calculi. The ureter is not seen. Bladder: Bladder is not well seen, likely related to underdistention. Other: Technically challenging examination due to patient body habitus and immobility. IMPRESSION: No urinary tract dilation or shadowing calculi. Electronically Signed   By: Limin  Xu M.D.   On: 06/08/2024 09:16   CT KNEE LEFT WO CONTRAST Result Date: 06/07/2024 CLINICAL DATA:  Status post fall. EXAM: CT OF THE LEFT KNEE WITHOUT CONTRAST TECHNIQUE: Multidetector CT imaging of the left knee was performed according to the standard protocol. Multiplanar CT image reconstructions were also generated. RADIATION DOSE REDUCTION: This exam was performed according to the departmental dose-optimization program which includes automated exposure control, adjustment of the mA and/or kV according to patient size and/or use of iterative reconstruction technique. COMPARISON:  None Available. FINDINGS: Bones/Joint/Cartilage An acute, comminuted fracture deformity is seen involving the supracondylar region of the distal left femoral shaft. Multiple fracture fragments of various sizes are seen, with approximately 1 shaft width posteromedial displacement of the distal fracture site. There is no evidence of dislocation. Marked severity tricompartmental degenerative changes are seen involving the left knee. Ligaments Suboptimally assessed by CT. Muscles and Tendons Limited in evaluation without gross abnormality visualized. Soft tissues A moderate sized hematoma is seen surrounding the previously noted fracture site. A moderate to  large joint effusion is noted. IMPRESSION: 1. Acute, comminuted fracture of the distal left femoral shaft. 2. Marked severity tricompartmental degenerative changes involving the left knee. 3. Moderate to large joint effusion. Electronically Signed   By: Virgle Grime M.D.   On: 06/07/2024 19:42   DG FEMUR MIN 2 VIEWS LEFT Result Date: 06/07/2024 CLINICAL DATA:  Status post fall. EXAM: LEFT FEMUR 2 VIEWS COMPARISON:  None Available. FINDINGS: Acute, comminuted fracture deformity is seen involving the supracondylar region of the distal left femoral shaft. Approximately 1 shaft width posterior displacement of the  distal fracture site is seen. There is no evidence of dislocation. Marked severity tricompartmental degenerative changes are present. A large joint effusion is also noted. IMPRESSION: 1. Acute, comminuted fracture of the distal left femoral shaft. 2. Marked severity tricompartmental degenerative changes. 3. Large joint effusion. Electronically Signed   By: Virgle Grime M.D.   On: 06/07/2024 19:37   DG Knee 1-2 Views Left Result Date: 06/07/2024 CLINICAL DATA:  Left knee pain, fell on Wednesday EXAM: LEFT KNEE - 1-2 VIEW COMPARISON:  None Available. FINDINGS: Frontal and cross-table lateral views of the left knee are obtained. There is a comminuted oblique fracture through the distal left femoral metadiaphyseal junction, with external rotation and dorsal displacement of the distal fracture fragment. Moderate 3 compartmental osteoarthritis. Diffuse soft tissue swelling. IMPRESSION: 1. Comminuted fracture at the distal left femoral metadiaphyseal junction, with external rotation and dorsal displacement of the distal fracture fragment. 2. Diffuse soft tissue swelling. 3. Three compartmental osteoarthritis. Electronically Signed   By: Bobbye Burrow M.D.   On: 06/07/2024 18:54   DG Chest Port 1 View Result Date: 06/07/2024 CLINICAL DATA:  Short of breath, recent fall EXAM: PORTABLE CHEST 1 VIEW  COMPARISON:  05/27/2024 FINDINGS: Single frontal view of the chest demonstrates a stable cardiac silhouette. Patchy left basilar consolidation and small left pleural effusion concerning for underlying pneumonia or aspiration. Right chest is clear. Right chest is clear. No pneumothorax. No acute bony abnormalities. IMPRESSION: 1. Persistent left basilar consolidation and small left pleural effusion, concerning for underlying infection or aspiration. Electronically Signed   By: Bobbye Burrow M.D.   On: 06/07/2024 18:53        Scheduled Meds:  heparin   5,000 Units Subcutaneous Q8H    HYDROmorphone  (DILAUDID ) injection  1 mg Intravenous Once   Continuous Infusions:  sodium chloride  100 mL/hr at 06/08/24 0933     LOS: 1 day    Time spent: 55 minutes    Zeyna Mkrtchyan D Mason Sole, DO [facility]  If 7PM-7AM, please contact night-coverage [email or web address] 06/08/2024, 10:22 AM

## 2024-06-09 ENCOUNTER — Encounter (HOSPITAL_COMMUNITY)
Admission: EM | Disposition: A | Discharge status: Skilled Nursing Facility | Payer: Self-pay | Source: Home / Self Care | Attending: Internal Medicine

## 2024-06-09 DIAGNOSIS — S7292XA Unspecified fracture of left femur, initial encounter for closed fracture: Secondary | ICD-10-CM | POA: Diagnosis not present

## 2024-06-09 LAB — CBC
HCT: 30.8 % — ABNORMAL LOW (ref 36.0–46.0)
Hemoglobin: 9.4 g/dL — ABNORMAL LOW (ref 12.0–15.0)
MCH: 31 pg (ref 26.0–34.0)
MCHC: 30.5 g/dL (ref 30.0–36.0)
MCV: 101.7 fL — ABNORMAL HIGH (ref 80.0–100.0)
Platelets: 195 10*3/uL (ref 150–400)
RBC: 3.03 MIL/uL — ABNORMAL LOW (ref 3.87–5.11)
RDW: 15.6 % — ABNORMAL HIGH (ref 11.5–15.5)
WBC: 11.4 10*3/uL — ABNORMAL HIGH (ref 4.0–10.5)
nRBC: 0 % (ref 0.0–0.2)

## 2024-06-09 LAB — MAGNESIUM: Magnesium: 1.8 mg/dL (ref 1.7–2.4)

## 2024-06-09 LAB — GLUCOSE, CAPILLARY
Glucose-Capillary: 106 mg/dL — ABNORMAL HIGH (ref 70–99)
Glucose-Capillary: 131 mg/dL — ABNORMAL HIGH (ref 70–99)
Glucose-Capillary: 65 mg/dL — ABNORMAL LOW (ref 70–99)
Glucose-Capillary: 66 mg/dL — ABNORMAL LOW (ref 70–99)
Glucose-Capillary: 69 mg/dL — ABNORMAL LOW (ref 70–99)
Glucose-Capillary: 77 mg/dL (ref 70–99)

## 2024-06-09 LAB — BASIC METABOLIC PANEL WITH GFR
Anion gap: 9 (ref 5–15)
BUN: 80 mg/dL — ABNORMAL HIGH (ref 8–23)
CO2: 22 mmol/L (ref 22–32)
Calcium: 8.2 mg/dL — ABNORMAL LOW (ref 8.9–10.3)
Chloride: 107 mmol/L (ref 98–111)
Creatinine, Ser: 2.73 mg/dL — ABNORMAL HIGH (ref 0.44–1.00)
GFR, Estimated: 18 mL/min — ABNORMAL LOW (ref >60)
Glucose, Bld: 78 mg/dL (ref 70–99)
Potassium: 4.8 mmol/L (ref 3.5–5.1)
Sodium: 138 mmol/L (ref 135–145)

## 2024-06-09 SURGERY — OPEN REDUCTION INTERNAL FIXATION (ORIF) DISTAL FEMUR FRACTURE
Anesthesia: General | Laterality: Left

## 2024-06-09 MED ORDER — SENNOSIDES-DOCUSATE SODIUM 8.6-50 MG PO TABS
2.0000 | ORAL_TABLET | Freq: Every day | ORAL | Status: DC
  Administered 2024-06-09 – 2024-06-14 (×5): 2 via ORAL
  Filled 2024-06-09 (×6): qty 2

## 2024-06-09 MED ORDER — DEXTROSE-SODIUM CHLORIDE 5-0.9 % IV SOLN: INTRAVENOUS | Status: AC

## 2024-06-09 MED ORDER — LEVOTHYROXINE SODIUM 137 MCG PO TABS
137.0000 ug | ORAL_TABLET | Freq: Every day | ORAL | Status: DC
  Administered 2024-06-10 – 2024-06-15 (×5): 137 ug via ORAL
  Filled 2024-06-09 (×7): qty 1

## 2024-06-09 MED ORDER — FLUTICASONE FUROATE-VILANTEROL 200-25 MCG/ACT IN AEPB
1.0000 | INHALATION_SPRAY | Freq: Every day | RESPIRATORY_TRACT | Status: DC
  Administered 2024-06-10 – 2024-06-15 (×5): 1 via RESPIRATORY_TRACT
  Filled 2024-06-09: qty 28

## 2024-06-09 NOTE — Plan of Care (Signed)
  Problem: Clinical Measurements: Goal: Ability to maintain clinical measurements within normal limits will improve Outcome: Progressing Goal: Respiratory complications will improve Outcome: Progressing   Problem: Activity: Goal: Risk for activity intolerance will decrease Outcome: Progressing   Problem: Coping: Goal: Level of anxiety will decrease Outcome: Progressing   Problem: Pain Managment: Goal: General experience of comfort will improve and/or be controlled Outcome: Progressing   Problem: Safety: Goal: Ability to remain free from injury will improve Outcome: Progressing   Problem: Skin Integrity: Goal: Risk for impaired skin integrity will decrease Outcome: Progressing

## 2024-06-09 NOTE — Progress Notes (Signed)
 PROGRESS NOTE    Christina Castaneda  ZOX:[id number] DOB: 1956/08/05 DOA: 06/07/2024 PCP: Malka Sea, DO   Brief Narrative:    Christina Castaneda is a 68 y.o. female with medical history of nicotine abuse, HTN, hypothyroidism who fell while trying to sit on her toilet at home in the middle of the night on Tuesday.  She was not able to get up and EMS was called.  She was transferred from the bathroom to her bed by EMS who then left her at home.  Since that date she has not been able to stand or ambulate. She was just discharged from the hospital on June 8.  She states she had a touch of pneumonia at that time.  Patient had imaging studies demonstrating acute comminuted fracture of the distal left femur and she is noted to have severe AKI with hyperkalemia as well likely prerenal in the setting of poor oral intake along with use of ACE inhibitor.  Plans are for likely orthopedic intervention by 6/19 once AKI further improves.  Assessment & Plan:   Principal Problem:   Femur fracture, left (HCC) Active Problems:   AKI (acute kidney injury) (HCC)   Hypothyroid   COPD  GOLD 2/AB   OSA (obstructive sleep apnea)   Obesity   Essential hypertension   Generalized weakness   S/P colostomy (HCC)   Cigarette smoker  Assessment and Plan:   Left femur fracture - Acute comminuted fracture of distal femur with moderate to large joint effusion - She likely obtained this fracture on Tuesday night when she fell while trying to get on the commode - High risk for DVT as she has not been out of bed since fall on Tuesday- start Heparin  prophylaxis until surgery can be performed - Patient okay to remain at [facility].  Spoke with Dr. Ernesta Heading who is agreeable for operative intervention here by 6/19 once AKI further improves.    AKI, hyperkalemia in setting of ACE inhibitor use-improving - baseline Cr 0.8 -Current BUN 117 with a creatinine of 4.50 and a potassium of 5.3 -Suspect prerenal-she does not  admit to poor oral intake but notes from last admission state that her oral intake was poor  -Renal ultrasound unremarkable -Has taken about 4 doses of aspirin this week and no other NSAIDs - She takes Benicar  daily which will be held - She has been in the ED a little over 3 hours and has not had any urine output despite receiving 3 L of normal saline - Foley placed for accurate input/output -CK is not elevated - Continue IV fluid with D5 and avoid nephrotoxic agents   Mild hypoglycemia - Continues to remain persistent, check CBGs with current diet   Acute hypoxic respiratory failure - Pulse ox is 88 to 89%-EMS placed 2 L of oxygen on her - The patient does wear oxygen at home during nighttime - Portable chest x-ray that was performed earlier shows left basilar consolidation - Suspect she has atelectasis secondary to not being out of bed for days and this is likely superimposed on COPD     COPD-ongoing cigarette use - The patient states that she has never been told that she has emphysema or COPD and continues to smoke about half to 3/4 pack/day - She has seen [facility] in the past, specifically Dr. Waymond Hailey and is maintained on Advair and albuterol  - She declines a nicotine patch - Smoking cessation counseling done   Leukocytosis-improving - Acute stress response?  Recently  was treated for pneumonia but overall no signs of infection at this time - UA negative - Follow-up WBC count in a.m.     OSA (obstructive sleep apnea) - CPAP ordered   Colostomy with history of colocutaneous fistula - She is able to manage her colostomy herself     Obesity Estimated body mass index is 38.52 kg/m as calculated from the following:   Height as of this encounter: 5' 5 (1.651 m).   Weight as of this encounter: 105 kg.    DVT prophylaxis: Heparin  Code Status: Full Family Communication: None at bedside Disposition Plan:  Status is: Inpatient Remains inpatient appropriate because: Need  for IV medications and inpatient procedure  Skin Assessment:  I have examined the patient's skin and I agree with the wound assessment as performed by the wound care RN as outlined below:  Pressure Injury 05/27/24 Sacrum Stage 2 -  Partial thickness loss of dermis presenting as a shallow open injury with a red, pink wound bed without slough. 4 small stage II present, each measures 0.4x 0.4 x 0.2 (Active)  05/27/24 1815  Location: Sacrum  Location Orientation:   Staging: Stage 2 -  Partial thickness loss of dermis presenting as a shallow open injury with a red, pink wound bed without slough.  Wound Description (Comments): 4 small stage II present, each measures 0.4x 0.4 x 0.2  DO NOT USE:  Present on Admission:   Dressing Type Foam - Lift dressing to assess site every shift 05/31/24 0855    Consultants:  Dr. Ernesta Heading orthopedics  Procedures:  None  Antimicrobials:  None   Subjective: Patient seen and evaluated today with no new acute complaints or concerns. No acute concerns or events noted overnight.  She denies any significant pain.  Noted to have hypoglycemia overnight and is now on D5 IV fluid.  Creatinine levels continue to trend downwards.  Objective: Vitals:   06/08/24 1644 06/08/24 1945 06/08/24 2322 06/09/24 0605  BP: (!) 107/59 (!) 99/57 (!) 93/49 (!) 115/59  Pulse: 63 69 64 69  Resp: 19 20 20 20   Temp: 97.7 F (36.5 C) 97.7 F (36.5 C) 97.6 F (36.4 C) (!) 97.5 F (36.4 C)  TempSrc: Oral Oral Oral Oral  SpO2: 92% 96% 95% 92%  Weight:      Height:        Intake/Output Summary (Last 24 hours) at 06/09/2024 0828 Last data filed at 06/09/2024 0634 Gross per 24 hour  Intake 2541.92 ml  Output 1450 ml  Net 1091.92 ml   Filed Weights   06/07/24 1700  Weight: 105 kg    Examination:  General exam: Appears calm and comfortable, obese Respiratory system: Clear to auscultation. Respiratory effort normal.  Currently on 2 L nasal cannula Cardiovascular system: S1  & S2 heard, RRR.  Gastrointestinal system: Abdomen is soft Central nervous system: Alert and awake Extremities: No edema Skin: No significant lesions noted Psychiatry: Flat affect.    Data Reviewed: I have personally reviewed following labs and imaging studies  CBC: Recent Labs  Lab 06/07/24 1710 06/08/24 0638 06/09/24 0442  WBC 13.8* 13.3* 11.4*  NEUTROABS 11.7*  --   --   HGB 10.8* 9.4* 9.4*  HCT 31.7* 29.7* 30.8*  MCV 96.6 100.0 101.7*  PLT 289 251 195   Basic Metabolic Panel: Recent Labs  Lab 06/07/24 1710 06/08/24 0517 06/09/24 0442  NA 133* 136 138  K 5.3* 4.4 4.8  CL 96* 107 107  CO2 22 17* 22  GLUCOSE 66* 58* 78  BUN 117* 100* 80*  CREATININE 4.50* 3.53* 2.73*  CALCIUM  8.5* 7.6* 8.2*  MG 2.1  --  1.8   GFR: Estimated Creatinine Clearance: 23.1 mL/min (A) (by C-G formula based on SCr of 2.73 mg/dL (H)). Liver Function Tests: Recent Labs  Lab 06/07/24 1710  AST 52*  ALT 26  ALKPHOS 58  BILITOT 0.9  PROT 6.2*  ALBUMIN 2.0*   No results for input(s): LIPASE, AMYLASE in the last 168 hours. No results for input(s): AMMONIA in the last 168 hours. Coagulation Profile: No results for input(s): INR, PROTIME in the last 168 hours. Cardiac Enzymes: Recent Labs  Lab 06/08/24 0638  CKTOTAL 52   BNP (last 3 results) No results for input(s): PROBNP in the last 8760 hours. HbA1C: No results for input(s): HGBA1C in the last 72 hours. CBG: Recent Labs  Lab 06/09/24 0010 06/09/24 0042 06/09/24 0102 06/09/24 0205 06/09/24 0556  GLUCAP 65* 69* 77 106* 66*   Lipid Profile: No results for input(s): CHOL, HDL, LDLCALC, TRIG, CHOLHDL, LDLDIRECT in the last 72 hours. Thyroid  Function Tests: No results for input(s): TSH, T4TOTAL, FREET4, T3FREE, THYROIDAB in the last 72 hours. Anemia Panel: No results for input(s): VITAMINB12, FOLATE, FERRITIN, TIBC, IRON, RETICCTPCT in the last 72 hours. Sepsis Labs: No  results for input(s): PROCALCITON, LATICACIDVEN in the last 168 hours.  No results found for this or any previous visit (from the past 240 hours).       Radiology Studies: US  RENAL Result Date: 06/08/2024 CLINICAL DATA:  Acute kidney injury EXAM: RENAL / URINARY TRACT ULTRASOUND COMPLETE COMPARISON:  None Available. FINDINGS: Right Kidney: Length = 11.9 cm AP renal pelvis diameter = <10 mm Duplex configuration. Normal parenchymal echogenicity with preserved corticomedullary differentiation. No urinary tract dilation or shadowing calculi. The ureter is not seen. Left Kidney: Length = 11.4 cm AP renal pelvis diameter = <10 mm Normal parenchymal echogenicity with preserved corticomedullary differentiation. No urinary tract dilation or shadowing calculi. The ureter is not seen. Bladder: Bladder is not well seen, likely related to underdistention. Other: Technically challenging examination due to patient body habitus and immobility. IMPRESSION: No urinary tract dilation or shadowing calculi. Electronically Signed   By: Limin  Xu M.D.   On: 06/08/2024 09:16   CT KNEE LEFT WO CONTRAST Result Date: 06/07/2024 CLINICAL DATA:  Status post fall. EXAM: CT OF THE LEFT KNEE WITHOUT CONTRAST TECHNIQUE: Multidetector CT imaging of the left knee was performed according to the standard protocol. Multiplanar CT image reconstructions were also generated. RADIATION DOSE REDUCTION: This exam was performed according to the departmental dose-optimization program which includes automated exposure control, adjustment of the mA and/or kV according to patient size and/or use of iterative reconstruction technique. COMPARISON:  None Available. FINDINGS: Bones/Joint/Cartilage An acute, comminuted fracture deformity is seen involving the supracondylar region of the distal left femoral shaft. Multiple fracture fragments of various sizes are seen, with approximately 1 shaft width posteromedial displacement of the distal fracture  site. There is no evidence of dislocation. Marked severity tricompartmental degenerative changes are seen involving the left knee. Ligaments Suboptimally assessed by CT. Muscles and Tendons Limited in evaluation without gross abnormality visualized. Soft tissues A moderate sized hematoma is seen surrounding the previously noted fracture site. A moderate to large joint effusion is noted. IMPRESSION: 1. Acute, comminuted fracture of the distal left femoral shaft. 2. Marked severity tricompartmental degenerative changes involving the left knee. 3. Moderate to large joint effusion. Electronically Signed   By:  Virgle Grime M.D.   On: 06/07/2024 19:42   DG FEMUR MIN 2 VIEWS LEFT Result Date: 06/07/2024 CLINICAL DATA:  Status post fall. EXAM: LEFT FEMUR 2 VIEWS COMPARISON:  None Available. FINDINGS: Acute, comminuted fracture deformity is seen involving the supracondylar region of the distal left femoral shaft. Approximately 1 shaft width posterior displacement of the distal fracture site is seen. There is no evidence of dislocation. Marked severity tricompartmental degenerative changes are present. A large joint effusion is also noted. IMPRESSION: 1. Acute, comminuted fracture of the distal left femoral shaft. 2. Marked severity tricompartmental degenerative changes. 3. Large joint effusion. Electronically Signed   By: Virgle Grime M.D.   On: 06/07/2024 19:37   DG Knee 1-2 Views Left Result Date: 06/07/2024 CLINICAL DATA:  Left knee pain, fell on Wednesday EXAM: LEFT KNEE - 1-2 VIEW COMPARISON:  None Available. FINDINGS: Frontal and cross-table lateral views of the left knee are obtained. There is a comminuted oblique fracture through the distal left femoral metadiaphyseal junction, with external rotation and dorsal displacement of the distal fracture fragment. Moderate 3 compartmental osteoarthritis. Diffuse soft tissue swelling. IMPRESSION: 1. Comminuted fracture at the distal left femoral metadiaphyseal  junction, with external rotation and dorsal displacement of the distal fracture fragment. 2. Diffuse soft tissue swelling. 3. Three compartmental osteoarthritis. Electronically Signed   By: Bobbye Burrow M.D.   On: 06/07/2024 18:54   DG Chest Port 1 View Result Date: 06/07/2024 CLINICAL DATA:  Short of breath, recent fall EXAM: PORTABLE CHEST 1 VIEW COMPARISON:  05/27/2024 FINDINGS: Single frontal view of the chest demonstrates a stable cardiac silhouette. Patchy left basilar consolidation and small left pleural effusion concerning for underlying pneumonia or aspiration. Right chest is clear. Right chest is clear. No pneumothorax. No acute bony abnormalities. IMPRESSION: 1. Persistent left basilar consolidation and small left pleural effusion, concerning for underlying infection or aspiration. Electronically Signed   By: Bobbye Burrow M.D.   On: 06/07/2024 18:53        Scheduled Meds:  Chlorhexidine  Gluconate Cloth  6 each Topical Daily   heparin   5,000 Units Subcutaneous Q8H    HYDROmorphone  (DILAUDID ) injection  1 mg Intravenous Once   nystatin    Topical BID   Continuous Infusions:  dextrose  5 % and 0.9 % NaCl 40 mL/hr at 06/09/24 0621     LOS: 2 days    Time spent: 55 minutes    Tameyah Koch Loran Rock, DO [facility]  If 7PM-7AM, please contact night-coverage [email or web address] 06/09/2024, 8:28 AM

## 2024-06-10 DIAGNOSIS — G4733 Obstructive sleep apnea (adult) (pediatric): Secondary | ICD-10-CM

## 2024-06-10 DIAGNOSIS — E039 Hypothyroidism, unspecified: Secondary | ICD-10-CM | POA: Diagnosis not present

## 2024-06-10 DIAGNOSIS — S72452A Displaced supracondylar fracture without intracondylar extension of lower end of left femur, initial encounter for closed fracture: Secondary | ICD-10-CM | POA: Diagnosis not present

## 2024-06-10 DIAGNOSIS — S72452G Displaced supracondylar fracture without intracondylar extension of lower end of left femur, subsequent encounter for closed fracture with delayed healing: Secondary | ICD-10-CM | POA: Diagnosis not present

## 2024-06-10 DIAGNOSIS — N179 Acute kidney failure, unspecified: Secondary | ICD-10-CM | POA: Diagnosis not present

## 2024-06-10 LAB — GLUCOSE, CAPILLARY
Glucose-Capillary: 76 mg/dL (ref 70–99)
Glucose-Capillary: 80 mg/dL (ref 70–99)
Glucose-Capillary: 81 mg/dL (ref 70–99)
Glucose-Capillary: 81 mg/dL (ref 70–99)
Glucose-Capillary: 85 mg/dL (ref 70–99)
Glucose-Capillary: 91 mg/dL (ref 70–99)

## 2024-06-10 LAB — CBC
HCT: 31.2 % — ABNORMAL LOW (ref 36.0–46.0)
Hemoglobin: 9.7 g/dL — ABNORMAL LOW (ref 12.0–15.0)
MCH: 31.9 pg (ref 26.0–34.0)
MCHC: 31.1 g/dL (ref 30.0–36.0)
MCV: 102.6 fL — ABNORMAL HIGH (ref 80.0–100.0)
Platelets: 138 10*3/uL — ABNORMAL LOW (ref 150–400)
RBC: 3.04 MIL/uL — ABNORMAL LOW (ref 3.87–5.11)
RDW: 15.8 % — ABNORMAL HIGH (ref 11.5–15.5)
WBC: 10.1 10*3/uL (ref 4.0–10.5)
nRBC: 0 % (ref 0.0–0.2)

## 2024-06-10 LAB — BASIC METABOLIC PANEL WITH GFR
Anion gap: 6 (ref 5–15)
BUN: 71 mg/dL — ABNORMAL HIGH (ref 8–23)
CO2: 24 mmol/L (ref 22–32)
Calcium: 8.3 mg/dL — ABNORMAL LOW (ref 8.9–10.3)
Chloride: 109 mmol/L (ref 98–111)
Creatinine, Ser: 2.02 mg/dL — ABNORMAL HIGH (ref 0.44–1.00)
GFR, Estimated: 26 mL/min — ABNORMAL LOW (ref >60)
Glucose, Bld: 79 mg/dL (ref 70–99)
Potassium: 4.9 mmol/L (ref 3.5–5.1)
Sodium: 139 mmol/L (ref 135–145)

## 2024-06-10 LAB — SURGICAL PCR SCREEN
MRSA, PCR: NEGATIVE
Staphylococcus aureus: NEGATIVE

## 2024-06-10 LAB — MAGNESIUM: Magnesium: 1.6 mg/dL — ABNORMAL LOW (ref 1.7–2.4)

## 2024-06-10 MED ORDER — MAGNESIUM SULFATE 2 GM/50ML IV SOLN
2.0000 g | Freq: Once | INTRAVENOUS | Status: AC
  Administered 2024-06-10: 2 g via INTRAVENOUS
  Filled 2024-06-10: qty 50

## 2024-06-10 MED ORDER — MAGIC MOUTHWASH W/LIDOCAINE
15.0000 mL | Freq: Four times a day (QID) | ORAL | Status: DC | PRN
  Filled 2024-06-10: qty 15

## 2024-06-10 MED ORDER — ROSUVASTATIN CALCIUM 20 MG PO TABS
10.0000 mg | ORAL_TABLET | Freq: Every day | ORAL | Status: DC
  Administered 2024-06-10 – 2024-06-15 (×6): 10 mg via ORAL
  Filled 2024-06-10 (×8): qty 1

## 2024-06-10 MED ORDER — CEFAZOLIN SODIUM-DEXTROSE 2-4 GM/100ML-% IV SOLN
2.0000 g | INTRAVENOUS | Status: AC
  Filled 2024-06-10: qty 100

## 2024-06-10 MED ORDER — CLINDAMYCIN PHOSPHATE 900 MG/50ML IV SOLN
900.0000 mg | INTRAVENOUS | Status: DC
  Filled 2024-06-10: qty 50

## 2024-06-10 MED ORDER — DEXTROSE-SODIUM CHLORIDE 5-0.9 % IV SOLN: INTRAVENOUS | Status: DC

## 2024-06-10 MED ORDER — TRANEXAMIC ACID-NACL 1000-0.7 MG/100ML-% IV SOLN
1000.0000 mg | INTRAVENOUS | Status: AC
  Filled 2024-06-10: qty 100

## 2024-06-10 MED ORDER — GABAPENTIN 300 MG PO CAPS
300.0000 mg | ORAL_CAPSULE | Freq: Every day | ORAL | Status: DC
  Administered 2024-06-10 – 2024-06-14 (×4): 300 mg via ORAL
  Filled 2024-06-10 (×5): qty 1

## 2024-06-10 MED ORDER — ALLOPURINOL 100 MG PO TABS
300.0000 mg | ORAL_TABLET | Freq: Every day | ORAL | Status: DC
  Administered 2024-06-10 – 2024-06-15 (×5): 300 mg via ORAL
  Filled 2024-06-10 (×6): qty 3

## 2024-06-10 NOTE — Plan of Care (Signed)
   Problem: Education: Goal: Knowledge of General Education information will improve Description Including pain rating scale, medication(s)/side effects and non-pharmacologic comfort measures Outcome: Progressing   Problem: Education: Goal: Knowledge of General Education information will improve Description Including pain rating scale, medication(s)/side effects and non-pharmacologic comfort measures Outcome: Progressing

## 2024-06-10 NOTE — Hospital Course (Addendum)
 68 y.o. female with medical history of nicotine abuse, HTN, hypothyroidism who fell while trying to sit on her toilet at home in the middle of the night on Tuesday.  She was not able to get up and EMS was called.  She was transferred from the bathroom to her bed by EMS who then left her at home.  Since that date she has not been able to stand or ambulate.  She was just discharged from the hospital on June 8.  She states she had a touch of pneumonia at that time.   Patient had imaging studies demonstrating acute comminuted fracture of the distal left femur and she is noted to have severe AKI with hyperkalemia as well likely prerenal in the setting of poor oral intake along with use of ACE inhibitor.  Plans are for likely orthopedic intervention by 6/19 once AKI further improves.

## 2024-06-10 NOTE — Progress Notes (Signed)
 PROGRESS NOTE   Christina Castaneda  ZOX:[id number] DOB: Aug 09, 1956 DOA: 06/07/2024 PCP: Malka Sea, DO   Chief Complaint  Patient presents with   Fall   Level of care: Telemetry  Brief Admission History:  68 y.o. female with medical history of nicotine abuse, HTN, hypothyroidism who fell while trying to sit on her toilet at home in the middle of the night on Tuesday.  She was not able to get up and EMS was called.  She was transferred from the bathroom to her bed by EMS who then left her at home.  Since that date she has not been able to stand or ambulate.  She was just discharged from the hospital on June 8.  She states she had a touch of pneumonia at that time.   Patient had imaging studies demonstrating acute comminuted fracture of the distal left femur and she is noted to have severe AKI with hyperkalemia as well likely prerenal in the setting of poor oral intake along with use of ACE inhibitor.  Plans are for likely orthopedic intervention by 6/19 once AKI further improves.   Assessment and Plan:  Left femur fracture - Acute comminuted fracture of distal femur with moderate to large joint effusion -- She likely obtained this fracture on Tuesday night when she fell while trying to get on the commode - High risk for DVT as she has not been out of bed since fall on Tuesday- start Heparin  prophylaxis until surgery can be performed - Patient okay to remain at [facility].  Spoke with Dr. Ernesta Heading who is agreeable for operative intervention here by 6/19 once AKI further improves.    AKI, hyperkalemia in setting of ACE inhibitor use-improving -- baseline Cr 0.8 -Current BUN 117 with a creatinine of 4.50 and a potassium of 5.3 -Suspect prerenal-she does not admit to poor oral intake but notes from last admission state that her oral intake was poor  --Renal ultrasound unremarkable -Has taken about 4 doses of aspirin this week and no other NSAIDs - home benicar  held - Foley placed for  accurate input/output --CK is not elevated -- Continue IV fluid with D5 and avoid nephrotoxic agents   Mild hypoglycemia - resolved after dextrose  fluids and diet    Acute hypoxic respiratory failure - Pulse ox is 88 to 89%-EMS placed 2 L of oxygen on her - The patient does wear oxygen at home during nighttime - Portable chest x-ray that was performed earlier shows left basilar consolidation - Suspect she has atelectasis secondary to not being out of bed for days and this is likely superimposed on COPD     COPD-ongoing cigarette use - The patient states that she has never been told that she has emphysema or COPD and continues to smoke about half to 3/4 pack/day - She has seen [facility] in the past, specifically Dr. Waymond Hailey and is maintained on Advair and albuterol  - She declines a nicotine patch - Smoking cessation counseling done   Leukocytosis-resolved  - Acute stress response?  Recently was treated for pneumonia but overall no signs of infection at this time - UA negative - WBC 10.0 today     OSA (obstructive sleep apnea) - CPAP ordered   Colostomy with history of colocutaneous fistula - She is able to manage her colostomy herself     Obesity Estimated body mass index is 38.52 kg/m as calculated from the following:   Height as of this encounter: 5' 5 (1.651 m).   Weight  as of this encounter: 105 kg.   DVT prophylaxis: SQ heparin  Code Status: Full  Family Communication:  Disposition: TBD     Consultants:  Orthopedics  Procedures:   Antimicrobials:    Subjective: Pt reports that she is having a sore mouth and has difficulty with eating regular foods.   Objective: Vitals:   06/10/24 0513 06/10/24 0721 06/10/24 0910 06/10/24 0929  BP: 115/70 (!) 115/56 (!) 99/56 (!) 104/56  Pulse: 64 75 71   Resp: 18  18   Temp: 98 F (36.7 C) 97.6 F (36.4 C) 97.7 F (36.5 C)   TempSrc: Oral Oral Oral   SpO2: 92% 93% 93% 96%  Weight:      Height:         Intake/Output Summary (Last 24 hours) at 06/10/2024 1227 Last data filed at 06/10/2024 0916 Gross per 24 hour  Intake 2475.13 ml  Output 1050 ml  Net 1425.13 ml   Filed Weights   06/07/24 1700  Weight: 105 kg   Examination:  General exam: Appears calm and comfortable  Respiratory system: Clear to auscultation. Respiratory effort normal. Cardiovascular system: normal S1 & S2 heard. No JVD, murmurs, rubs, gallops or clicks. No pedal edema. Gastrointestinal system: Abdomen is nondistended, soft and nontender. No organomegaly or masses felt. Normal bowel sounds heard. Central nervous system: Alert and oriented. No focal neurological deficits. Extremities: left femur swollen, rotated, distal pulses palpable BLEs Skin: No rashes, lesions or ulcers. Psychiatry: Judgement and insight appear normal. Mood & affect appropriate.   Data Reviewed: I have personally reviewed following labs and imaging studies  CBC: Recent Labs  Lab 06/07/24 1710 06/08/24 0638 06/09/24 0442 06/10/24 0423  WBC 13.8* 13.3* 11.4* 10.1  NEUTROABS 11.7*  --   --   --   HGB 10.8* 9.4* 9.4* 9.7*  HCT 31.7* 29.7* 30.8* 31.2*  MCV 96.6 100.0 101.7* 102.6*  PLT 289 251 195 138*    Basic Metabolic Panel: Recent Labs  Lab 06/07/24 1710 06/08/24 0517 06/09/24 0442 06/10/24 0423  NA 133* 136 138 139  K 5.3* 4.4 4.8 4.9  CL 96* 107 107 109  CO2 22 17* 22 24  GLUCOSE 66* 58* 78 79  BUN 117* 100* 80* 71*  CREATININE 4.50* 3.53* 2.73* 2.02*  CALCIUM  8.5* 7.6* 8.2* 8.3*  MG 2.1  --  1.8 1.6*    CBG: Recent Labs  Lab 06/09/24 1127 06/10/24 0044 06/10/24 0529 06/10/24 0530 06/10/24 1136  GLUCAP 131* 81 85 80 91    No results found for this or any previous visit (from the past 240 hours).   Radiology Studies: No results found.  Scheduled Meds:  allopurinol   300 mg Oral Daily   Chlorhexidine  Gluconate Cloth  6 each Topical Daily   fluticasone  furoate-vilanterol  1 puff Inhalation Daily    gabapentin   300 mg Oral QHS   heparin   5,000 Units Subcutaneous Q8H   levothyroxine   137 mcg Oral Q0600   nystatin    Topical BID   rosuvastatin   10 mg Oral Daily   senna-docusate  2 tablet Oral QHS   Continuous Infusions:  [START ON 06/11/2024] clindamycin  (CLEOCIN ) IV     dextrose  5 % and 0.9 % NaCl     magnesium  sulfate bolus IVPB 2 g (06/10/24 1207)   [START ON 06/11/2024] tranexamic acid       LOS: 3 days   Time spent: 55 mins  Vianney Kopecky Lincoln Renshaw, MD How to contact the [facility] Attending or Consulting provider 7A -  7P or covering provider during after hours 7P -7A, for this patient?  Check the care team in [facility] and look for a) attending/consulting [facility] provider listed and b) the [facility] team listed Log into [email or web address] to find provider on call.  Locate the [facility] provider you are looking for under [facility] and page to a number that you can be directly reached. If you still have difficulty reaching the provider, please page the [facility] (Director on Call) for the Hospitalists listed on amion for assistance.  06/10/2024, 12:27 PM

## 2024-06-10 NOTE — Progress Notes (Signed)
   06/10/24 2310  BiPAP/CPAP/SIPAP  BiPAP/CPAP/SIPAP Pt Type Adult  BiPAP/CPAP/SIPAP DREAMSTATIOND  Mask Type Nasal pillows (patient's home mask)  Dentures removed? Not applicable  EPAP 8 cmH2O  Flow Rate 3 lpm  Patient Home Machine No  Patient Home Mask Yes  Patient Home Tubing No  Auto Titrate No  Device Plugged into RED Power Outlet Yes  BiPAP/CPAP /SiPAP Vitals  Pulse Rate (!) 57  Resp 18  SpO2 91 %  Bilateral Breath Sounds Expiratory wheezes;Diminished  MEWS Score/Color  MEWS Score 0  MEWS Score Color Marrie Sizer

## 2024-06-10 NOTE — Care Management Important Message (Signed)
 Important Message  Patient Details  Name: Christina Castaneda MRN: [id number] Date of Birth: Apr 04, 1956   Important Message Given:  Yes - Medicare IM     [name] [name] 06/10/2024, 11:22 AM

## 2024-06-10 NOTE — Consult Note (Signed)
 ORTHOPAEDIC CONSULTATION  REQUESTING PHYSICIAN: Rayfield Cairo, MD  ASSESSMENT AND PLAN: 68 y.o. female with the following: Comminuted left distal femur fracture  This patient requires inpatient admission to manage this problem appropriately.  The patient has an acute kidney injury, which is trending down.  Plan to proceed with surgery when she is healthy enough to to proceed.  Plan for retrograde femoral nail.   - Weight Bearing Status/Activity: Nonweightbearing left lower extremity  - Additional recommended labs/tests: None  -VTE Prophylaxis: As needed  - Pain control: As needed  - Follow-up plan: Approximately 2 weeks after surgery  -Procedures: Operative fixation of left distal femur fracture with a retrograde nail  Chief Complaint: Left leg pain  HPI: Christina Castaneda is a 68 y.o. female with past medical history as listed below.  She was at home, using a walker, when she lost her balance and fell.  She had immediate pain.  She did not present to the hospital for several days.  As such, when she was evaluated, she had an acute kidney injury.  She has pain in the left knee.  Initially, she was to be transferred to [facility].  However, she responded well to hydration, and the decision was made to proceed with surgery at [facility].  She has been wearing a knee immobilizer.  Her pain has been controlled.  She has remained nonweightbearing.  Past Medical History:  Diagnosis Date   Asthma    mild   Atypical endometrial hyperplasia    COPD (chronic obstructive pulmonary disease) (HCC)    Goiter    Gout    Heart murmur    Hypertension    Hypothyroidism    Vitamin B12 deficiency    Yeast infection    for last 3 weeks   Past Surgical History:  Procedure Laterality Date   ABDOMINAL WALL DEFECT REPAIR N/A 04/18/2016   Procedure: CLOSURE OF ABDOMEN;  Surgeon: Sim Dryer, MD;  Location: [facility] OR;  Service: General;  Laterality: N/A;   APPENDECTOMY     APPLICATION OF  WOUND VAC N/A 04/02/2016   Procedure: application of wound vac+;  Surgeon: Aldean Hummingbird, MD;  Location: [facility] OR;  Service: General;  Laterality: N/A;   CHOLECYSTECTOMY  2009   gallstone removed   COLOSTOMY N/A 03/30/2016   Procedure: COLOSTOMY;  Surgeon: Enid Harry, MD;  Location: [facility] OR;  Service: General;  Laterality: N/A;   COLOSTOMY REVISION N/A 03/28/2016   Procedure: COLOSTOMY REVISION;  Surgeon: Jerryl Morin, MD;  Location: [facility] OR;  Service: General;  Laterality: N/A;   COLOSTOMY REVISION N/A 03/30/2016   Procedure: COLON RESECTION LEFT;  Surgeon: Enid Harry, MD;  Location: [facility] OR;  Service: General;  Laterality: N/A;   LAPAROTOMY N/A 03/28/2016   Procedure: EXPLORATORY LAPAROTOMY;  Surgeon: Jerryl Morin, MD;  Location: [facility] OR;  Service: General;  Laterality: N/A;   LAPAROTOMY N/A 04/02/2016   Procedure: Re-exploration of open abdomen, application of abdominal wound vac;  Surgeon: Aldean Hummingbird, MD;  Location: [facility] OR;  Service: General;  Laterality: N/A;   LAPAROTOMY N/A 04/04/2016   Procedure: EXPLORATORY LAPAROTOMY, PLACEMENT OF ABRA ABDOMINAL WALL CLOSURE SET ;  Surgeon: Aldean Hummingbird, MD;  Location: [facility] OR;  Service: General;  Laterality: N/A;   LAPAROTOMY N/A 04/18/2016   Procedure: EXPLORATORY LAPAROTOMY;  Surgeon: Sim Dryer, MD;  Location: [facility] OR;  Service: General;  Laterality: N/A;   NECK SURGERY  1994   repair disk   OMENTECTOMY N/A 03/28/2016   Procedure:  OMENTECTOMY;  Surgeon: Jerryl Morin, MD;  Location: [facility] OR;  Service: General;  Laterality: N/A;   ROBOTIC ASSISTED TOTAL HYSTERECTOMY WITH BILATERAL SALPINGO OOPHERECTOMY  11/18/2012   Procedure: ROBOTIC ASSISTED TOTAL HYSTERECTOMY WITH BILATERAL SALPINGO OOPHORECTOMY;  Surgeon: Daryel Ensign A. Clerance Dais, MD;  Location: [facility] ORS;  Service: Gynecology;  Laterality: N/A;   TONSILLECTOMY     as a child   VACUUM ASSISTED CLOSURE CHANGE N/A 03/30/2016   Procedure: ABDOMINAL VAC CHANGE;  Surgeon: Enid Harry, MD;  Location: [facility] OR;  Service:  General;  Laterality: N/A;   WOUND DEBRIDEMENT N/A 03/28/2016   Procedure: DEBRIDEMENT WOUND;  Surgeon: Jerryl Morin, MD;  Location: [facility] OR;  Service: General;  Laterality: N/A;   Social History   Socioeconomic History   Marital status: Married    Spouse name: Not on file   Number of children: Not on file   Years of education: Not on file   Highest education level: Not on file  Occupational History   Not on file  Tobacco Use   Smoking status: Every Day    Current packs/day: 1.50    Average packs/day: 1.5 packs/day for 28.0 years (42.0 ttl pk-yrs)    Types: Cigarettes   Smokeless tobacco: Never  Vaping Use   Vaping status: Never Used  Substance and Sexual Activity   Alcohol use: Yes    Alcohol/week: 1.0 - 3.0 standard drink of alcohol    Types: 1 - 3 Standard drinks or equivalent per week    Comment: couple beers on the weekend   Drug use: No   Sexual activity: Yes    Partners: Male    Birth control/protection: Surgical    Comment: Hysterectomy  Other Topics Concern   Not on file  Social History Narrative   Not on file   Social Drivers of Health   Financial Resource Strain: Not on file  Food Insecurity: No Food Insecurity (06/08/2024)   Hunger Vital Sign    Worried About Running Out of Food in the Last Year: Never true    Ran Out of Food in the Last Year: Never true  Transportation Needs: No Transportation Needs (06/08/2024)   PRAPARE - Administrator, Civil Service (Medical): No    Lack of Transportation (Non-Medical): No  Physical Activity: Not on file  Stress: Not on file  Social Connections: Unknown (06/08/2024)   Social Connection and Isolation Panel    Frequency of Communication with Friends and Family: Not on file    Frequency of Social Gatherings with Friends and Family: Once a week    Attends Religious Services: 1 to 4 times per year    Active Member of [employer] or Organizations: No    Attends Banker Meetings: Never    Marital Status:  Married  Recent Concern: Social Connections - Moderately Isolated (05/27/2024)   Social Connection and Isolation Panel    Frequency of Communication with Friends and Family: Once a week    Frequency of Social Gatherings with Friends and Family: Once a week    Attends Religious Services: 1 to 4 times per year    Active Member of [employer] or Organizations: No    Attends Engineer, structural: Never    Marital Status: Married   Family History  Problem Relation Age of Onset   COPD Mother    Diabetes Mother    Congestive Heart Failure Mother    Diabetes Father    Heart attack Father    Allergies  Allergen Reactions   Penicillins Anaphylaxis, Hives, Shortness Of Breath and Swelling    Pt given ceftriaxone  05/27/24 in ED and tolerated   Biphosphate Other (See Comments)    Aching joints   Fluticasone  Other (See Comments)    Headaches   Prior to Admission medications   Medication Sig Start Date End Date Taking? Authorizing Provider  acetaminophen  (TYLENOL ) 500 MG tablet Take 1,000 mg by mouth every 6 (six) hours.   Yes [provider]  albuterol  (VENTOLIN  HFA) 108 (90 Base) MCG/ACT inhaler Inhale 2 puffs into the lungs every 6 (six) hours as needed. Wheezing and shortness of breath 05/31/24 07/30/24 Yes Shahmehdi, Seyed A, MD  allopurinol  (ZYLOPRIM ) 300 MG tablet Take 300 mg by mouth daily.   Yes [provider]  Cyanocobalamin (VITAMIN B-12 CR PO) Take 1 tablet by mouth daily.   Yes [provider]  Ergocalciferol  50 MCG (2000 UT) TABS Take 2,000 Units by mouth daily.   Yes [provider]  fluticasone -salmeterol (ADVAIR) 500-50 MCG/ACT AEPB Inhale 1 puff into the lungs in the morning and at bedtime. 05/31/24 06/30/24 Yes Shahmehdi, Constantino Demark, MD  gabapentin  (NEURONTIN ) 300 MG capsule Take 1-2 capsules by mouth at bedtime. 02/27/21  Yes [provider]  levothyroxine  (SYNTHROID ) 137 MCG tablet Take 137 mcg by mouth daily.   Yes [provider]   nystatin  (MYCOSTATIN /NYSTOP ) powder Apply topically 3 (three) times daily. 05/31/24  Yes Shahmehdi, Seyed A, MD  rosuvastatin  (CRESTOR ) 10 MG tablet Take 10 mg by mouth daily.   Yes [provider]  senna-docusate (SENOKOT-S) 8.6-50 MG tablet Take 2 tablets by mouth at bedtime. 05/31/24 06/30/24 Yes Shahmehdi, Constantino Demark, MD   No results found. Family History Reviewed and non-contributory, no pertinent history of problems with bleeding or anesthesia    Review of Systems No fevers or chills No numbness or tingling No chest pain No shortness of breath No bowel or bladder dysfunction No GI distress No headaches    OBJECTIVE  Vitals:Patient Vitals for the past 8 hrs:  BP Temp Temp src Pulse Resp SpO2  06/10/24 0929 (!) 104/56 -- -- -- -- 96 %  06/10/24 0910 (!) 99/56 97.7 F (36.5 C) Oral 71 18 93 %  06/10/24 0721 (!) 115/56 97.6 F (36.4 C) Oral 75 -- 93 %  06/10/24 0513 115/70 98 F (36.7 C) Oral 64 18 92 %   General: Alert, no acute distress Cardiovascular: Warm extremities noted Respiratory: No cyanosis, no use of accessory musculature GI: No organomegaly, abdomen is soft and non-tender Skin: No lesions in the area of chief complaint other than those listed below in MSK exam.  Neurologic: Sensation intact distally save for the below mentioned MSK exam Psychiatric: Patient is competent for consent with normal mood and affect Lymphatic: No swelling obvious and reported other than the area involved in the exam below Extremities   LLE: Swelling about the left knee.  Knee immobilizer in place.  Toes warm well-perfused.  Active motion intact distally.  2+ DP pulse.    Test Results Imaging X-ray and CT scan of the left knee demonstrates a comminuted, distal radius fracture, without obvious intra-articular extension.  Labs cbc Recent Labs    06/09/24 0442 06/10/24 0423  WBC 11.4* 10.1  HGB 9.4* 9.7*  HCT 30.8* 31.2*  PLT 195 138*      Recent Labs     06/09/24 0442 06/10/24 0423  NA 138 139  K 4.8 4.9  CL 107 109  CO2  22 24  GLUCOSE 78 79  BUN 80* 71*  CREATININE 2.73* 2.02*  CALCIUM  8.2* 8.3*

## 2024-06-10 NOTE — Progress Notes (Signed)
 Nurse at bedside,patient c/o mouth being sore,patient unable to eat.Dr Lincoln Renshaw notified. Plan of care on going.

## 2024-06-10 NOTE — Plan of Care (Signed)

## 2024-06-10 NOTE — Progress Notes (Signed)
 This RN attests to student documentation. Shift assessment performed under the supervision of this RN.  Solimar Maiden V. Meade Hogeland, MSN-RN Clinical Instructor/Nursing Faculty [facility]

## 2024-06-11 DIAGNOSIS — N179 Acute kidney failure, unspecified: Secondary | ICD-10-CM | POA: Diagnosis not present

## 2024-06-11 DIAGNOSIS — G4733 Obstructive sleep apnea (adult) (pediatric): Secondary | ICD-10-CM | POA: Diagnosis not present

## 2024-06-11 DIAGNOSIS — E039 Hypothyroidism, unspecified: Secondary | ICD-10-CM | POA: Diagnosis not present

## 2024-06-11 DIAGNOSIS — S72452G Displaced supracondylar fracture without intracondylar extension of lower end of left femur, subsequent encounter for closed fracture with delayed healing: Secondary | ICD-10-CM | POA: Diagnosis not present

## 2024-06-11 LAB — GLUCOSE, CAPILLARY
Glucose-Capillary: 120 mg/dL — ABNORMAL HIGH (ref 70–99)
Glucose-Capillary: 81 mg/dL (ref 70–99)
Glucose-Capillary: 95 mg/dL (ref 70–99)
Glucose-Capillary: 95 mg/dL (ref 70–99)

## 2024-06-11 LAB — CBC
HCT: 31.7 % — ABNORMAL LOW (ref 36.0–46.0)
Hemoglobin: 9.8 g/dL — ABNORMAL LOW (ref 12.0–15.0)
MCH: 32.1 pg (ref 26.0–34.0)
MCHC: 30.9 g/dL (ref 30.0–36.0)
MCV: 103.9 fL — ABNORMAL HIGH (ref 80.0–100.0)
Platelets: 140 10*3/uL — ABNORMAL LOW (ref 150–400)
RBC: 3.05 MIL/uL — ABNORMAL LOW (ref 3.87–5.11)
RDW: 15.8 % — ABNORMAL HIGH (ref 11.5–15.5)
WBC: 10.3 10*3/uL (ref 4.0–10.5)
nRBC: 0 % (ref 0.0–0.2)

## 2024-06-11 LAB — BASIC METABOLIC PANEL WITH GFR
Anion gap: 5 (ref 5–15)
BUN: 54 mg/dL — ABNORMAL HIGH (ref 8–23)
CO2: 24 mmol/L (ref 22–32)
Calcium: 8.6 mg/dL — ABNORMAL LOW (ref 8.9–10.3)
Chloride: 108 mmol/L (ref 98–111)
Creatinine, Ser: 1.66 mg/dL — ABNORMAL HIGH (ref 0.44–1.00)
GFR, Estimated: 33 mL/min — ABNORMAL LOW (ref >60)
Glucose, Bld: 84 mg/dL (ref 70–99)
Potassium: 4.9 mmol/L (ref 3.5–5.1)
Sodium: 137 mmol/L (ref 135–145)

## 2024-06-11 LAB — MAGNESIUM: Magnesium: 2 mg/dL (ref 1.7–2.4)

## 2024-06-11 MED ORDER — DEXTROSE-SODIUM CHLORIDE 5-0.9 % IV SOLN: INTRAVENOUS | Status: AC

## 2024-06-11 MED ORDER — HYDROMORPHONE HCL 1 MG/ML IJ SOLN
0.5000 mg | Freq: Once | INTRAMUSCULAR | Status: AC
  Filled 2024-06-11: qty 0.5

## 2024-06-11 NOTE — Progress Notes (Signed)
 Nurse at bedside,patient alert and oriented times four. Patient is going for surgery tomorrow per Dr Ernesta Heading orders,patient was allowed to eat.Plan of care on going.Aaron Aas

## 2024-06-11 NOTE — Progress Notes (Signed)
 PROGRESS NOTE   Christina Castaneda  VWU:[id number] DOB: October 29, 1956 DOA: 06/07/2024 PCP: Malka Sea, DO   Chief Complaint  Patient presents with   Fall   Level of care: Telemetry  Brief Admission History:  68 y.o. female with medical history of nicotine abuse, HTN, hypothyroidism who fell while trying to sit on her toilet at home in the middle of the night on Tuesday.  She was not able to get up and EMS was called.  She was transferred from the bathroom to her bed by EMS who then left her at home.  Since that date she has not been able to stand or ambulate.  She was just discharged from the hospital on June 8.  She states she had a touch of pneumonia at that time.   Patient had imaging studies demonstrating acute comminuted fracture of the distal left femur and she is noted to have severe AKI with hyperkalemia as well likely prerenal in the setting of poor oral intake along with use of ACE inhibitor.  Plans are for likely orthopedic intervention by 6/19 once AKI further improves.   Assessment and Plan:  Left femur fracture - Acute comminuted fracture of distal femur with moderate to large joint effusion -- She likely obtained this fracture on 6/10 when she fell while trying to get on the commode - High risk for DVT as she has not been out of bed since fall on Tuesday- continue Heparin  prophylaxis until surgery can be performed - Patient okay to remain at [facility].  Spoke with Dr. Ernesta Heading who is agreeable for operative intervention here which is planned for 6/20, confirmed with surgeon.    AKI, hyperkalemia in setting of ACE inhibitor use-improving -- baseline Cr 0.8 -Initial labs showed BUN 117 with a creatinine of 4.50 and a potassium of 5.3 --creatinine improved to 1.66 today -Suspect prerenal-she does not admit to poor oral intake but notes from last admission state that her oral intake was poor but she was noted to have some urinary retention as well and foley is in place --  Renal ultrasound unremarkable - home benicar  held - Foley placed for accurate input/output -- CK is not elevated -- Continue IV fluid with D5 and avoid nephrotoxic agents  Acute urinary retention --foley cath remains in place since she is going to OR tomorrow --plan to remove foley cath after surgery tomorrow and follow voiding   Mild hypoglycemia - resolved after dextrose  fluids and diet   Oral stomatitis -- reporting mouth pain, ordered for magic mouthwash PRN, soft foods diet    Acute hypoxic respiratory failure - Pulse ox is 88 to 89%-EMS placed 2 L of oxygen on her - The patient does wear oxygen at home during nighttime - Portable chest x-ray that was performed earlier shows left basilar consolidation - Suspect she has atelectasis secondary to not being out of bed for days and this is likely superimposed on COPD     COPD-ongoing cigarette use - The patient states that she has never been told that she has emphysema or COPD and continues to smoke about half to 3/4 pack/day - She has seen [facility] in the past, specifically Dr. Waymond Hailey and is maintained on Advair and albuterol  - She declines a nicotine patch - Smoking cessation counseling done   Leukocytosis-resolved  - Acute stress response?  Recently was treated for pneumonia but overall no signs of infection at this time - UA negative - WBC 10.3  OSA (obstructive sleep apnea) - CPAP ordered nightly   Colostomy with history of colocutaneous fistula - She is able to manage her colostomy herself   Obesity Estimated body mass index is 38.52 kg/m as calculated from the following:   Height as of this encounter: 5' 5 (1.651 m).   Weight as of this encounter: 105 kg.   DVT prophylaxis: SQ heparin  Code Status: Full  Communication: discussed plan of care in detail with patient today who verbalized understanding Disposition: TBD     Consultants:  Orthopedics   Procedures:  Tentative orthopedic surgery for  6/20  Antimicrobials:    Subjective: Tolerating soft diet better, mouth pain, eager to have surgery tomorrow and work on rehab   Objective: Vitals:   06/10/24 2002 06/10/24 2310 06/11/24 0431 06/11/24 1006  BP: (!) 109/54  130/63 111/61  Pulse: (!) 55 (!) 57 63 71  Resp: 17 18 18    Temp: 97.6 F (36.4 C)  97.8 F (36.6 C) 98.4 F (36.9 C)  TempSrc: Oral  Oral Oral  SpO2: 99% 91% 96% 91%  Weight:      Height:        Intake/Output Summary (Last 24 hours) at 06/11/2024 1222 Last data filed at 06/11/2024 0500 Gross per 24 hour  Intake 955.08 ml  Output 1200 ml  Net -244.92 ml   Filed Weights   06/07/24 1700  Weight: 105 kg   Examination:  General exam: Appears calm and comfortable  Respiratory system: Clear to auscultation. Respiratory effort normal. Cardiovascular system: normal S1 & S2 heard. No JVD, murmurs, rubs, gallops or clicks. No pedal edema. Gastrointestinal system: Abdomen is nondistended, soft and nontender. No organomegaly or masses felt. Normal bowel sounds heard. Central nervous system: Alert and oriented. No focal neurological deficits. Extremities: left femur swollen, rotated, distal pulses palpable BLEs Skin: No rashes, lesions or ulcers. Psychiatry: Judgement and insight appear normal. Mood & affect appropriate.   Data Reviewed: I have personally reviewed following labs and imaging studies  CBC: Recent Labs  Lab 06/07/24 1710 06/08/24 0638 06/09/24 0442 06/10/24 0423 06/11/24 0421  WBC 13.8* 13.3* 11.4* 10.1 10.3  NEUTROABS 11.7*  --   --   --   --   HGB 10.8* 9.4* 9.4* 9.7* 9.8*  HCT 31.7* 29.7* 30.8* 31.2* 31.7*  MCV 96.6 100.0 101.7* 102.6* 103.9*  PLT 289 251 195 138* 140*    Basic Metabolic Panel: Recent Labs  Lab 06/07/24 1710 06/08/24 0517 06/09/24 0442 06/10/24 0423 06/11/24 0421 06/11/24 0451  NA 133* 136 138 139 137  --   K 5.3* 4.4 4.8 4.9 4.9  --   CL 96* 107 107 109 108  --   CO2 22 17* 22 24 24   --   GLUCOSE 66*  58* 78 79 84  --   BUN 117* 100* 80* 71* 54*  --   CREATININE 4.50* 3.53* 2.73* 2.02* 1.66*  --   CALCIUM  8.5* 7.6* 8.2* 8.3* 8.6*  --   MG 2.1  --  1.8 1.6*  --  2.0    CBG: Recent Labs  Lab 06/10/24 1136 06/10/24 1814 06/10/24 2352 06/11/24 0432 06/11/24 1114  GLUCAP 91 81 76 81 95    Recent Results (from the past 240 hours)  Surgical pcr screen     Status: None   Collection Time: 06/10/24  4:00 PM   Specimen: Nasal Mucosa; Nasal Swab  Result Value Ref Range Status   MRSA, PCR NEGATIVE NEGATIVE Final   Staphylococcus  aureus NEGATIVE NEGATIVE Final    Comment: (NOTE) The Xpert SA Assay (FDA approved for NASAL specimens in patients 46 years of age and older), is one component of a comprehensive surveillance program. It is not intended to diagnose infection nor to guide or monitor treatment. Performed at [facility], [address] [place], [place] [address]      Radiology Studies: No results found.  Scheduled Meds:  allopurinol   300 mg Oral Daily   Chlorhexidine  Gluconate Cloth  6 each Topical Daily   fluticasone  furoate-vilanterol  1 puff Inhalation Daily   gabapentin   300 mg Oral QHS   heparin   5,000 Units Subcutaneous Q8H   levothyroxine   137 mcg Oral Q0600   nystatin    Topical BID   rosuvastatin   10 mg Oral Daily   senna-docusate  2 tablet Oral QHS   Continuous Infusions:   ceFAZolin (ANCEF) IV     dextrose  5 % and 0.9 % NaCl 50 mL/hr at 06/11/24 1014   tranexamic acid       LOS: 4 days   Time spent: 55 mins  Emilliano Dilworth Lincoln Renshaw, MD How to contact the [facility] Attending or Consulting provider 7A - 7P or covering provider during after hours 7P -7A, for this patient?  Check the care team in [facility] and look for a) attending/consulting [facility] provider listed and b) the [facility] team listed Log into [email or web address] to find provider on call.  Locate the [facility] provider you are looking for under [facility] and page to a number that you can be directly reached. If you  still have difficulty reaching the provider, please page the [facility] (Director on Call) for the Hospitalists listed on amion for assistance.  06/11/2024, 12:22 PM

## 2024-06-11 NOTE — Progress Notes (Signed)
   06/11/24 2236  BiPAP/CPAP/SIPAP  BiPAP/CPAP/SIPAP Pt Type Adult  BiPAP/CPAP/SIPAP DREAMSTATIOND  Mask Type Nasal pillows (patient's home machine)  Dentures removed? Not applicable  Respiratory Rate 17 breaths/min  EPAP 8 cmH2O  Flow Rate 3 lpm  Patient Home Machine No  Patient Home Mask Yes  Patient Home Tubing No  Auto Titrate No  Device Plugged into RED Power Outlet Yes  BiPAP/CPAP /SiPAP Vitals  Pulse Rate (!) 59  Resp 17  SpO2 99 %  Bilateral Breath Sounds Clear;Diminished  MEWS Score/Color  MEWS Score 0  MEWS Score Color Marrie Sizer

## 2024-06-11 NOTE — Progress Notes (Signed)
 Patient had a hard time tolerating repositioning throughout the night, patient refused to let staff change sacrum dressing as patient could not tolerate being turned on her sides completely.

## 2024-06-11 NOTE — Plan of Care (Signed)
  Problem: Education: Goal: Knowledge of General Education information will improve Description: Including pain rating scale, medication(s)/side effects and non-pharmacologic comfort measures Outcome: Progressing   Problem: Health Behavior/Discharge Planning: Goal: Ability to manage health-related needs will improve Outcome: Progressing   Problem: Nutrition: Goal: Adequate nutrition will be maintained Outcome: Progressing   Problem: Coping: Goal: Level of anxiety will decrease Outcome: Progressing   Problem: Elimination: Goal: Will not experience complications related to bowel motility Outcome: Progressing Goal: Will not experience complications related to urinary retention Outcome: Progressing   Problem: Safety: Goal: Ability to remain free from injury will improve Outcome: Progressing   Problem: Pain Managment: Goal: General experience of comfort will improve and/or be controlled Outcome: Progressing

## 2024-06-11 NOTE — Plan of Care (Signed)
  Problem: Education: Goal: Knowledge of General Education information will improve Description: Including pain rating scale, medication(s)/side effects and non-pharmacologic comfort measures Outcome: Progressing   Problem: Health Behavior/Discharge Planning: Goal: Ability to manage health-related needs will improve Outcome: Progressing   Problem: Clinical Measurements: Goal: Ability to maintain clinical measurements within normal limits will improve Outcome: Progressing Goal: Will remain free from infection Outcome: Progressing Goal: Diagnostic test results will improve Outcome: Progressing Goal: Respiratory complications will improve Outcome: Progressing Goal: Cardiovascular complication will be avoided Outcome: Progressing   Problem: Nutrition: Goal: Adequate nutrition will be maintained Outcome: Progressing   Problem: Coping: Goal: Level of anxiety will decrease Outcome: Progressing   Problem: Elimination: Goal: Will not experience complications related to urinary retention Outcome: Progressing   Problem: Pain Managment: Goal: General experience of comfort will improve and/or be controlled Outcome: Progressing   Problem: Safety: Goal: Ability to remain free from injury will improve Outcome: Progressing

## 2024-06-12 ENCOUNTER — Other Ambulatory Visit: Payer: Self-pay

## 2024-06-12 ENCOUNTER — Inpatient Hospital Stay (HOSPITAL_COMMUNITY): Admitting: Anesthesiology

## 2024-06-12 ENCOUNTER — Encounter (HOSPITAL_COMMUNITY)
Admission: EM | Disposition: A | Discharge status: Skilled Nursing Facility | Payer: Self-pay | Source: Home / Self Care | Attending: Internal Medicine

## 2024-06-12 ENCOUNTER — Inpatient Hospital Stay (HOSPITAL_COMMUNITY)

## 2024-06-12 DIAGNOSIS — I10 Essential (primary) hypertension: Secondary | ICD-10-CM | POA: Diagnosis not present

## 2024-06-12 DIAGNOSIS — S72452A Displaced supracondylar fracture without intracondylar extension of lower end of left femur, initial encounter for closed fracture: Secondary | ICD-10-CM | POA: Diagnosis not present

## 2024-06-12 DIAGNOSIS — F17210 Nicotine dependence, cigarettes, uncomplicated: Secondary | ICD-10-CM

## 2024-06-12 DIAGNOSIS — S72452G Displaced supracondylar fracture without intracondylar extension of lower end of left femur, subsequent encounter for closed fracture with delayed healing: Secondary | ICD-10-CM | POA: Diagnosis not present

## 2024-06-12 DIAGNOSIS — J449 Chronic obstructive pulmonary disease, unspecified: Secondary | ICD-10-CM

## 2024-06-12 DIAGNOSIS — G4733 Obstructive sleep apnea (adult) (pediatric): Secondary | ICD-10-CM | POA: Diagnosis not present

## 2024-06-12 DIAGNOSIS — E039 Hypothyroidism, unspecified: Secondary | ICD-10-CM | POA: Diagnosis not present

## 2024-06-12 DIAGNOSIS — S72402A Unspecified fracture of lower end of left femur, initial encounter for closed fracture: Secondary | ICD-10-CM | POA: Diagnosis not present

## 2024-06-12 DIAGNOSIS — N179 Acute kidney failure, unspecified: Secondary | ICD-10-CM | POA: Diagnosis not present

## 2024-06-12 HISTORY — PX: FEMUR IM NAIL: SHX1597

## 2024-06-12 LAB — BASIC METABOLIC PANEL WITH GFR
Anion gap: 3 — ABNORMAL LOW (ref 5–15)
BUN: 43 mg/dL — ABNORMAL HIGH (ref 8–23)
CO2: 25 mmol/L (ref 22–32)
Calcium: 8.9 mg/dL (ref 8.9–10.3)
Chloride: 109 mmol/L (ref 98–111)
Creatinine, Ser: 1.44 mg/dL — ABNORMAL HIGH (ref 0.44–1.00)
GFR, Estimated: 39 mL/min — ABNORMAL LOW (ref >60)
Glucose, Bld: 95 mg/dL (ref 70–99)
Potassium: 5.6 mmol/L — ABNORMAL HIGH (ref 3.5–5.1)
Sodium: 137 mmol/L (ref 135–145)

## 2024-06-12 LAB — CBC
HCT: 34.6 % — ABNORMAL LOW (ref 36.0–46.0)
Hemoglobin: 10.4 g/dL — ABNORMAL LOW (ref 12.0–15.0)
MCH: 30.8 pg (ref 26.0–34.0)
MCHC: 30.1 g/dL (ref 30.0–36.0)
MCV: 102.4 fL — ABNORMAL HIGH (ref 80.0–100.0)
Platelets: 104 10*3/uL — ABNORMAL LOW (ref 150–400)
RBC: 3.38 MIL/uL — ABNORMAL LOW (ref 3.87–5.11)
RDW: 15.7 % — ABNORMAL HIGH (ref 11.5–15.5)
WBC: 14.9 10*3/uL — ABNORMAL HIGH (ref 4.0–10.5)
nRBC: 0 % (ref 0.0–0.2)

## 2024-06-12 LAB — POTASSIUM
Potassium: 5.5 mmol/L — ABNORMAL HIGH (ref 3.5–5.1)
Potassium: 5.1 mmol/L (ref 3.5–5.1)

## 2024-06-12 LAB — GLUCOSE, CAPILLARY
Glucose-Capillary: 79 mg/dL (ref 70–99)
Glucose-Capillary: 97 mg/dL (ref 70–99)
Glucose-Capillary: 100 mg/dL — ABNORMAL HIGH (ref 70–99)

## 2024-06-12 SURGERY — INSERTION, INTRAMEDULLARY ROD, FEMUR, RETROGRADE
Anesthesia: General | Site: Leg Upper | Laterality: Left

## 2024-06-12 MED ORDER — CEFAZOLIN SODIUM-DEXTROSE 2-4 GM/100ML-% IV SOLN
INTRAVENOUS | Status: AC
  Filled 2024-06-12: qty 100

## 2024-06-12 MED ORDER — LACTATED RINGERS IV SOLN: INTRAVENOUS | Status: DC

## 2024-06-12 MED ORDER — CLINDAMYCIN PHOSPHATE 900 MG/50ML IV SOLN
INTRAVENOUS | Status: AC
  Filled 2024-06-12: qty 50

## 2024-06-12 MED ORDER — ONDANSETRON HCL 4 MG/2ML IJ SOLN: 4.0000 mg | Freq: Once | INTRAMUSCULAR | Status: DC | PRN

## 2024-06-12 MED ORDER — FUROSEMIDE 10 MG/ML IJ SOLN
20.0000 mg | Freq: Once | INTRAMUSCULAR | Status: AC
  Administered 2024-06-12: 20 mg via INTRAVENOUS
  Filled 2024-06-12: qty 2

## 2024-06-12 MED ORDER — PHENYLEPHRINE HCL-NACL 20-0.9 MG/250ML-% IV SOLN
INTRAVENOUS | Status: AC
  Filled 2024-06-12: qty 250

## 2024-06-12 MED ORDER — DEXTROSE 50 % IV SOLN
1.0000 | Freq: Once | INTRAVENOUS | Status: AC
  Administered 2024-06-12: 50 mL via INTRAVENOUS
  Filled 2024-06-12: qty 50

## 2024-06-12 MED ORDER — SODIUM ZIRCONIUM CYCLOSILICATE 10 G PO PACK
10.0000 g | PACK | Freq: Once | ORAL | Status: DC
  Filled 2024-06-12: qty 1

## 2024-06-12 MED ORDER — OXYCODONE HCL 5 MG PO TABS: 5.0000 mg | ORAL_TABLET | Freq: Once | ORAL | Status: DC | PRN

## 2024-06-12 MED ORDER — PROPOFOL 10 MG/ML IV BOLUS
INTRAVENOUS | Status: AC
  Filled 2024-06-12: qty 20

## 2024-06-12 MED ORDER — OXYCODONE HCL 5 MG/5ML PO SOLN: 5.0000 mg | Freq: Once | ORAL | Status: DC | PRN

## 2024-06-12 MED ORDER — INSULIN ASPART 100 UNIT/ML IV SOLN
6.0000 [IU] | Freq: Once | INTRAVENOUS | Status: AC
  Administered 2024-06-12: 6 [IU] via INTRAVENOUS

## 2024-06-12 MED ORDER — BUPIVACAINE-EPINEPHRINE (PF) 0.5% -1:200000 IJ SOLN
INTRAMUSCULAR | Status: AC
  Filled 2024-06-12: qty 30

## 2024-06-12 MED ORDER — FENTANYL CITRATE PF 50 MCG/ML IJ SOSY: 25.0000 ug | PREFILLED_SYRINGE | INTRAMUSCULAR | Status: DC | PRN

## 2024-06-12 MED ORDER — PROPOFOL 10 MG/ML IV BOLUS
INTRAVENOUS | Status: DC | PRN
  Administered 2024-06-12 (×2): 50 ug/kg/min via INTRAVENOUS
  Administered 2024-06-12: 80 mg via INTRAVENOUS

## 2024-06-12 MED ORDER — SODIUM CHLORIDE 0.9 % IR SOLN
Status: DC | PRN
  Administered 2024-06-12: 1000 mL

## 2024-06-12 MED ORDER — TRANEXAMIC ACID-NACL 1000-0.7 MG/100ML-% IV SOLN
INTRAVENOUS | Status: DC | PRN
  Administered 2024-06-12: 1000 mg via INTRAVENOUS

## 2024-06-12 MED ORDER — CHLORHEXIDINE GLUCONATE 0.12 % MT SOLN
15.0000 mL | Freq: Once | OROMUCOSAL | Status: AC
  Administered 2024-06-12: 15 mL via OROMUCOSAL

## 2024-06-12 MED ORDER — SODIUM ZIRCONIUM CYCLOSILICATE 10 G PO PACK
10.0000 g | PACK | Freq: Once | ORAL | Status: AC
  Administered 2024-06-12: 10 g via ORAL
  Filled 2024-06-12: qty 1

## 2024-06-12 MED ORDER — DEXAMETHASONE SODIUM PHOSPHATE 10 MG/ML IJ SOLN
INTRAMUSCULAR | Status: DC | PRN
  Administered 2024-06-12: 10 mg via INTRAVENOUS

## 2024-06-12 MED ORDER — PHENYLEPHRINE HCL-NACL 20-0.9 MG/250ML-% IV SOLN
INTRAVENOUS | Status: DC | PRN
  Administered 2024-06-12: 50 ug/min via INTRAVENOUS

## 2024-06-12 MED ORDER — ORAL CARE MOUTH RINSE: 15.0000 mL | Freq: Once | OROMUCOSAL | Status: AC

## 2024-06-12 MED ORDER — FENTANYL CITRATE (PF) 100 MCG/2ML IJ SOLN
INTRAMUSCULAR | Status: AC
Start: 2024-06-12 — End: 2024-06-12
  Filled 2024-06-12: qty 2

## 2024-06-12 MED ORDER — FENTANYL CITRATE (PF) 100 MCG/2ML IJ SOLN
INTRAMUSCULAR | Status: DC | PRN
  Administered 2024-06-12: 100 ug via INTRAVENOUS

## 2024-06-12 MED ORDER — PHENYLEPHRINE 80 MCG/ML (10ML) SYRINGE FOR IV PUSH (FOR BLOOD PRESSURE SUPPORT)
PREFILLED_SYRINGE | INTRAVENOUS | Status: DC | PRN
  Administered 2024-06-12: 160 ug via INTRAVENOUS
  Administered 2024-06-12 (×3): 200 ug via INTRAVENOUS

## 2024-06-12 MED ORDER — CLINDAMYCIN PHOSPHATE 900 MG/50ML IV SOLN
INTRAVENOUS | Status: DC | PRN
Start: 2024-06-12 — End: 2024-06-12
  Administered 2024-06-12: 900 mg via INTRAVENOUS

## 2024-06-12 MED ORDER — EPHEDRINE SULFATE-NACL 50-0.9 MG/10ML-% IV SOSY
PREFILLED_SYRINGE | INTRAVENOUS | Status: DC | PRN
  Administered 2024-06-12: 10 mg via INTRAVENOUS
  Administered 2024-06-12: 5 mg via INTRAVENOUS
  Administered 2024-06-12: 10 mg via INTRAVENOUS

## 2024-06-12 MED ORDER — ORAL CARE MOUTH RINSE: 15.0000 mL | OROMUCOSAL | Status: DC | PRN

## 2024-06-12 SURGICAL SUPPLY — 60 items
BIT DRILL 4.0X165 AO STYLE (BIT) IMPLANT
BIT DRILL CALIBRATED AO 5.5 (DRILL) IMPLANT
BLADE HEX COATED 2.75 (ELECTRODE) ×2 IMPLANT
BLADE SURG SZ10 CARB STEEL (BLADE) ×4 IMPLANT
BNDG COHESIVE 4X5 TAN STRL (GAUZE/BANDAGES/DRESSINGS) ×2 IMPLANT
BNDG ELASTIC 6INX 5YD STR LF (GAUZE/BANDAGES/DRESSINGS) IMPLANT
CHLORAPREP W/TINT 26 (MISCELLANEOUS) ×2 IMPLANT
CLOTH BEACON ORANGE TIMEOUT ST (SAFETY) ×2 IMPLANT
COVER LIGHT HANDLE STERIS (MISCELLANEOUS) ×4 IMPLANT
COVER MAYO STAND XLG (MISCELLANEOUS) ×2 IMPLANT
DECANTER SPIKE VIAL GLASS SM (MISCELLANEOUS) ×2 IMPLANT
DRAPE C-ARM FOLDED MOBILE STRL (DRAPES) ×2 IMPLANT
DRAPE C-ARMOR (DRAPES) ×2 IMPLANT
DRAPE HALF SHEET 40X57 (DRAPES) ×8 IMPLANT
DRSG TEGADERM 4X4.75 (GAUZE/BANDAGES/DRESSINGS) ×8 IMPLANT
DRSG XEROFORM 1X8 (GAUZE/BANDAGES/DRESSINGS) ×2 IMPLANT
ELECTRODE REM PT RTRN 9FT ADLT (ELECTROSURGICAL) ×2 IMPLANT
GAUZE SPONGE 4X4 12PLY STRL (GAUZE/BANDAGES/DRESSINGS) ×2 IMPLANT
GAUZE XEROFORM 1X8 LF (GAUZE/BANDAGES/DRESSINGS) IMPLANT
GLOVE BIO SURGEON STRL SZ8 (GLOVE) ×6 IMPLANT
GLOVE BIOGEL M 8.0 STRL (GLOVE) ×6 IMPLANT
GLOVE BIOGEL PI IND STRL 7.0 (GLOVE) ×4 IMPLANT
GLOVE BIOGEL PI IND STRL 8 (GLOVE) ×4 IMPLANT
GOWN STRL REUS W/TWL LRG LVL3 (GOWN DISPOSABLE) ×2 IMPLANT
GOWN STRL REUS W/TWL XL LVL3 (GOWN DISPOSABLE) ×6 IMPLANT
GUIDEWIRE ORTH 900X3XBALL NOSE (WIRE) IMPLANT
IMMOBILIZER KNEE 24 THIGH 36 (MISCELLANEOUS) IMPLANT
INST SET MAJOR BONE (KITS) ×2 IMPLANT
KIT TURNOVER KIT A (KITS) ×2 IMPLANT
MANIFOLD NEPTUNE II (INSTRUMENTS) ×2 IMPLANT
MARKER SKIN DUAL TIP RULER LAB (MISCELLANEOUS) ×2 IMPLANT
NAIL RETROGRADE FEMORAL 12X36 (Nail) IMPLANT
NDL HYPO 21X1.5 SAFETY (NEEDLE) ×2 IMPLANT
NEEDLE HYPO 21X1.5 SAFETY (NEEDLE) ×1 IMPLANT
NS IRRIG 1000ML POUR BTL (IV SOLUTION) ×2 IMPLANT
PACK BASIC LIMB (CUSTOM PROCEDURE TRAY) IMPLANT
PACK SRG BSC III STRL LF ECLPS (CUSTOM PROCEDURE TRAY) ×2 IMPLANT
PAD ABD 5X9 TENDERSORB (GAUZE/BANDAGES/DRESSINGS) IMPLANT
PAD ARMBOARD POSITIONER FOAM (MISCELLANEOUS) ×2 IMPLANT
PADDING CAST COTTON 6X4 STRL (CAST SUPPLIES) IMPLANT
PENCIL SMOKE EVACUATOR COATED (MISCELLANEOUS) ×2 IMPLANT
PIN GUIDE THRD AR 3.2X330 (PIN) IMPLANT
POSITIONER HEAD 8X9X4 ADT (SOFTGOODS) ×2 IMPLANT
SCREW CANC CAPT FT 6.5X70 (Screw) IMPLANT
SCREW CANC CAPT FT 6.5X75 (Plate) IMPLANT
SCREW CANC CAPT FT 6.5X85 (Screw) IMPLANT
SCREW CORT CAPT FT 5.0X36 (Screw) IMPLANT
SET BASIN LINEN APH (SET/KITS/TRAYS/PACK) ×2 IMPLANT
SPONGE T-LAP 18X18 ~~LOC~~+RFID (SPONGE) ×4 IMPLANT
STAPLER VISISTAT (STAPLE) IMPLANT
STOCKINETTE IMPERVIOUS LG (DRAPES) ×2 IMPLANT
STRIP CLOSURE SKIN 1/2X4 (GAUZE/BANDAGES/DRESSINGS) ×2 IMPLANT
SUT MON AB 2-0 CT1 36 (SUTURE) ×4 IMPLANT
SUT VIC AB 1 CT1 27XBRD ANTBC (SUTURE) IMPLANT
SUT VIC AB 2-0 CT1 TAPERPNT 27 (SUTURE) ×2 IMPLANT
SYR BULB IRRIG 60ML STRL (SYRINGE) ×4 IMPLANT
TOWEL OR 17X26 4PK STRL BLUE (TOWEL DISPOSABLE) IMPLANT
WATER STERILE IRR 1000ML POUR (IV SOLUTION) ×4 IMPLANT
YANKAUER SUCT 12FT TUBE ARGYLE (SUCTIONS) ×2 IMPLANT
YANKAUER SUCT BULB TIP 10FT TU (MISCELLANEOUS) ×2 IMPLANT

## 2024-06-12 NOTE — Anesthesia Procedure Notes (Signed)
 Spinal  Patient location during procedure: OR Start time: 06/12/2024 12:50 PM End time: 06/12/2024 1:05 PM Reason for block: surgical anesthesia Staffing Performed: anesthesiologist  Anesthesiologist: Coretha Dew, MD Resident/CRNA: Delona Ferron, CRNA Other anesthesia staff: Emery Hans, RN Performed by: Delona Ferron, CRNA Authorized by: Coretha Dew, MD   Preanesthetic Checklist Completed: patient identified, IV checked, site marked, risks and benefits discussed, surgical consent, monitors and equipment checked, pre-op evaluation and timeout performed Spinal Block Patient position: left lateral decubitus Prep: DuraPrep and site prepped and draped Patient monitoring: heart rate, cardiac monitor, continuous pulse ox and blood pressure Approach: midline Location: L3-4 Injection technique: single-shot Needle Needle type: Quincke  Needle gauge: 22 G Needle length: 9 cm Assessment Sensory level: T4 Events: CSF return

## 2024-06-12 NOTE — Anesthesia Preprocedure Evaluation (Signed)
 Anesthesia Evaluation  Patient identified by MRN, date of birth, ID band Patient awake    Reviewed: Allergy & Precautions, H&P , NPO status , Patient's Chart, lab work & pertinent test results, reviewed documented beta blocker date and time   Airway Mallampati: II  TM Distance: >3 FB Neck ROM: full    Dental no notable dental hx.    Pulmonary asthma , sleep apnea , COPD, Current Smoker and Patient abstained from smoking.   Pulmonary exam normal breath sounds clear to auscultation       Cardiovascular Exercise Tolerance: Good hypertension, + Valvular Problems/Murmurs  Rhythm:regular Rate:Normal     Neuro/Psych  PSYCHIATRIC DISORDERS      negative neurological ROS     GI/Hepatic negative GI ROS, Neg liver ROS,,,  Endo/Other  Hypothyroidism  Class 3 obesity  Renal/GU Renal disease  negative genitourinary   Musculoskeletal   Abdominal   Peds  Hematology  (+) Blood dyscrasia, anemia   Anesthesia Other Findings   Reproductive/Obstetrics negative OB ROS                             Anesthesia Physical Anesthesia Plan  ASA: 3  Anesthesia Plan: General and Spinal   Post-op Pain Management:    Induction:   PONV Risk Score and Plan: Propofol  infusion  Airway Management Planned:   Additional Equipment:   Intra-op Plan:   Post-operative Plan:   Informed Consent: I have reviewed the patients History and Physical, chart, labs and discussed the procedure including the risks, benefits and alternatives for the proposed anesthesia with the patient or authorized representative who has indicated his/her understanding and acceptance.     Dental Advisory Given  Plan Discussed with: CRNA  Anesthesia Plan Comments:        Anesthesia Quick Evaluation

## 2024-06-12 NOTE — Progress Notes (Signed)
   06/12/24 2240  BiPAP/CPAP/SIPAP  BiPAP/CPAP/SIPAP Pt Type Adult  BiPAP/CPAP/SIPAP DREAMSTATIOND  Mask Type Nasal pillows (patient's home mask)  Dentures removed? Not applicable  Respiratory Rate 18 breaths/min  EPAP 8 cmH2O  Flow Rate 4 lpm  Patient Home Machine No  Patient Home Mask Yes  Patient Home Tubing No  Auto Titrate No  Device Plugged into RED Power Outlet Yes  BiPAP/CPAP /SiPAP Vitals  Pulse Rate 68  Resp 18  SpO2 93 %  Bilateral Breath Sounds Rhonchi;Diminished

## 2024-06-12 NOTE — Care Management Important Message (Signed)
 Important Message  Patient Details  Name: Christina Castaneda MRN: [id number] Date of Birth: [birth year]   Important Message Given:  Yes - Medicare IM     Kayleena Eke L Petro Talent 06/12/2024, 9:41 AM

## 2024-06-12 NOTE — Plan of Care (Signed)

## 2024-06-12 NOTE — Op Note (Signed)
 Orthopaedic Surgery Operative Note (CSN: [id number])  Khamya Topp Admire  1956-04-27 Date of Surgery: 06/12/2024   Diagnoses:  Comminuted left distal femur fracture  Procedure: Retrograde femoral nail   Operative Finding Successful completion of the planned procedure.  Placement of a femoral nail in retrograde fashion.  3 distal interlocking screws, with 2 proximal interlocking screws.  Significant comminution.  Poor bone quality overall.   Post-Op Diagnosis: Same Surgeons:Primary: Tonita Frater, MD Assistants: Isaac Manus Location: AP OR ROOM 4 Anesthesia: Sedation plus regional anesthesia Antibiotics: Clindamycin  Tourniquet time: N/A Estimated Blood Loss: 150 Complications: None Specimens: None  Implants: Implant Name Type Inv. Item Serial No. Manufacturer Lot No. LRB No. Used Action  Retrograde Femoral Nail    [employer] [id number] Left 1 Implanted  SCREW CANC CAPT FT 6.5X85 - [id number] Screw SCREW CANC CAPT FT 6.5X85  [employer] [id number] Left 1 Implanted  cancellous screw, captured, ft    [employer] [id number] Left 1 Implanted  SCREW CANC CAPT FT 6.5X75 - [id number] Plate SCREW CANC CAPT FT 6.5X75  [employer] [id number] Left 1 Implanted  SCREW CORT CAPT FT 5.0X36 - [id number] Screw SCREW CORT CAPT FT 5.0X36  [employer] [id number] Left 1 Implanted  SCREW CORT CAPT FT 5.0X36 - [id number] Screw SCREW CORT CAPT FT 5.0X36  [employer] [id number] Left 1 Implanted    Indications for Surgery:   Christina Castaneda is a 68 y.o. female who fell, over a week ago.  She remained in bed for several days.  She was brought to the emergency department via EMS.  She was noted to have a left distal femur fracture, as well as an AKI.  She responded well to hydration.  Her creatinine trended down.  After several days, we felt that she was stable to proceed to the operating room.  In order to restore form of function, I recommended operative fixation.  Benefits and risks of operative and nonoperative  management were discussed prior to surgery with the patient and informed consent form was completed.  Specific risks including infection, need for additional surgery, bleeding, stiffness, nonunion, malunion, malrotation, persistent pain, blood clots and more severe complications associated with anesthesia.  She elected to proceed.  Surgical consent was finalized.   Procedure:   The patient was identified properly. Informed consent was obtained and the surgical site was marked. The patient was taken to the OR where a spinal anesthesia was completed.  The patient was positioned supine, with a bump under her left hip.  The left leg was prepped and draped in the usual sterile fashion.  Timeout was performed before the beginning of the case.  She received clindamycin  prior to making incision.  She received 1 g of TXA.  We started with fluoroscopy.  We confirmed the overall alignment of the distal femoral shaft fracture.  There was significant comminution.  We ensured that we could get all views.  The leg was situated over a sterile triangle.  We pulled traction.  We confirmed that we are able to reduce the fracture.  Once were satisfied with the overall position, we proceeded to start surgery.  We made a longitudinal incision directly over the patellar tendon.  We incised sharply through subcutaneous tissue.  The peritenon was then exposed sharply, and flaps were created through the mid aspect of the patellar tendon.  We then incised sharply with a fresh knife directly through the fibers of the patella tendon to expose the knee joint.  We used a pair  of scissors to dissect bluntly to the distal aspect of the femur.  Under direct visualization, using fluoroscopy, a guidepin was inserted.  This was confirmed to be adequate starting point on both the AP and the lateral.  This was introduced in retrograde fashion.  The guidepin was directly down the mid aspect of the femoral shaft.  We then used an entry reamer.   Ball-tipped guidewire was then introduced and we measured the length of the nail.  The above size nail was selected, and secured to the outrigger.  The nail was then inserted under retrograde fashion.  We used fluoroscopy to confirm placement of the nail, without adverse events.  The midshaft portion of the femur had decent bone.  The 12 mm nail was placed to the level of the lesser trochanter proximally.  The fracture was evaluated, and we used the lateral cortex as a key, as there was less comminution.  We had an acceptable reduction in both AP and the lateral views.  We then proceeded to place a lateral to medial screw across the condyles.  The overall bone quality was poor.  We then placed 2 oblique screws through the distal portion of the femur, and these screws lock directly into the nail.  The positioning of the screws was confirmed under AP and lateral views of fluoroscopy.  Next, we turned our attention to the proximal extent of the nail.  Using perfect circle technique, we placed 2 screws from A-P to lock the nail proximally.  Position and length of the screws was confirmed under fluoroscopy.  Final fluoroscopic imaging of the left femur demonstrated acceptable alignment of the comminuted distal femur fracture in both AP and lateral views.  Screws were appropriate length.  Nothing further was needed.  We irrigated the wound copiously.  The patellar tendon was closed with a running 0 Vicryl stitch.  Next, we closed the incisions in a layered fashion with 2-0 Monocryl and staples in the skin.  Dry dressings were placed.  Knee immobilizer was secured.  The patient was awoken and transferred to the PACU in stable condition.   Post-operative plan:  The patient will be WBAT on the operative extremity.  Knee immobilizer in place at all times when ambulating Discharge home from the PACU once they have recovered DVT prophylaxis per primary team, no orthopedic contraindications.   Okay to resume  anticoagulation on postop day #1. Pain control with PRN pain medication preferring oral medicines.   Follow up plan will be scheduled in approximately 10-14 days for incision check and XR.

## 2024-06-12 NOTE — Transfer of Care (Signed)
 Immediate Anesthesia Transfer of Care Note  Patient: Christina Castaneda  Procedure(s) Performed: INSERTION, INTRAMEDULLARY ROD, FEMUR, RETROGRADE (Left: Leg Upper)  Patient Location: PACU  Anesthesia Type:MAC combined with regional for post-op pain  Level of Consciousness: drowsy and patient cooperative  Airway & Oxygen Therapy: Patient Spontanous Breathing and Patient connected to nasal cannula oxygen  Post-op Assessment: Report given to RN and Post -op Vital signs reviewed and stable  Post vital signs: Reviewed and stable  Last Vitals:  Vitals Value Taken Time  BP 136/120 06/12/24 15:27  Temp    Pulse 86 06/12/24 15:28  Resp 16 06/12/24 15:28  SpO2 94 % 06/12/24 15:28  Vitals shown include unfiled device data.  Last Pain:  Vitals:   06/12/24 1119  TempSrc: Oral  PainSc: 4       Patients Stated Pain Goal: 2 (06/11/24 1006)  Complications: No notable events documented.

## 2024-06-12 NOTE — Progress Notes (Signed)
   ORTHOPAEDIC PROGRESS NOTE  Scheduled for Procedure(s): Retrograde left femoral nail  DOS: 06/12/2024  SUBJECTIVE: No issues over night.  Pain is controlled in the brace.  NPO since midnight.  Ready to proceed with surgery  OBJECTIVE: PE:  Alert and oriented, no acute distress  Left leg with diffuse swelling Brace in place Toes [facility] Active motion intact to TA/EHL  Vitals:   06/12/24 1119 06/12/24 1130  BP: 108/64 111/61  Pulse: 86 82  Resp: 12   Temp: 98.5 F (36.9 C)   SpO2: 97% 95%      Latest Ref Rng & Units 06/12/2024    4:26 AM 06/11/2024    4:21 AM 06/10/2024    4:23 AM  CBC  WBC 4.0 - 10.5 K/uL 14.9  10.3  10.1   Hemoglobin 12.0 - 15.0 g/dL 40.9  9.8  9.7   Hematocrit 36.0 - 46.0 % 34.6  31.7  31.2   Platelets 150 - 400 K/uL 104  140  138       Latest Ref Rng & Units 06/12/2024   10:36 AM 06/12/2024    8:12 AM 06/12/2024    4:26 AM  BMP  Glucose 70 - 99 mg/dL   95   BUN 8 - 23 mg/dL   43   Creatinine 8.11 - 1.00 mg/dL   9.14   Sodium 782 - 956 mmol/L   137   Potassium 3.5 - 5.1 mmol/L 5.1  5.5  5.6   Chloride 98 - 111 mmol/L   109   CO2 22 - 32 mmol/L   25   Calcium  8.9 - 10.3 mg/dL   8.9      ASSESSMENT: Christina Castaneda is a 68 y.o. female stable, ready for OR.  Cr down trending to 1.4 today.  Hold anticoagulants  PLAN: Weightbearing: NWB LLE Incisional and dressing care: Reinforce dressings as needed; none currently Orthopedic device(s): Knee immobilizer VTE prophylaxis: Ok to resume POD#1 Pain control: As needed Follow - up plan: 2 weeks   Contact information:     Atheena Spano A. Ernesta Heading, MD MS [facility] [address] [place],  [place]  [address] Phone: [phone] Fax: [phone]

## 2024-06-12 NOTE — Progress Notes (Signed)
 Christina Castaneda done well in PACU.  She was alert and c/o no pain.  RN escorted her back to her room and turned her care over to floor RN. NAD.

## 2024-06-12 NOTE — Progress Notes (Signed)
 PROGRESS NOTE   Christina Castaneda  QMV:[id number] DOB: [birth year] DOA: 06/07/2024 PCP: Malka Sea, DO   Chief Complaint  Patient presents with   Fall   Level of care: Telemetry  Brief Admission History:  68 y.o. female with medical history of nicotine abuse, HTN, hypothyroidism who fell while trying to sit on her toilet at home in the middle of the night on Tuesday.  She was not able to get up and EMS was called.  She was transferred from the bathroom to her bed by EMS who then left her at home.  Since that date she has not been able to stand or ambulate.  She was just discharged from the hospital on June 8.  She states she had a touch of pneumonia at that time.   Patient had imaging studies demonstrating acute comminuted fracture of the distal left femur and she is noted to have severe AKI with hyperkalemia as well likely prerenal in the setting of poor oral intake along with use of ACE inhibitor.  Plans are for likely orthopedic intervention by 6/19 once AKI further improves.   Assessment and Plan:  Left femur fracture - Acute comminuted fracture of distal femur with moderate to large joint effusion -- She likely obtained this fracture on 6/10 when she fell while trying to get on the commode - High risk for DVT as she has not been out of bed since fall on Tuesday- continue Heparin  prophylaxis until surgery can be performed - Patient okay to remain at [facility].  Spoke with Dr. Ernesta Heading who is agreeable for operative intervention here which is planned for 6/20, confirmed with surgeon.    AKI, hyperkalemia in setting of ACE inhibitor use-improving --K up to 5.6 this morning, confirmed with repeat test, treated with lokelma, lasix , dextrose /insulin , down to 5.1 prior to surgery today, continue to follow daily labs -- baseline Cr 0.8 -Initial labs showed BUN 117 with a creatinine of 4.50 and a potassium of 5.3 --creatinine improved to 1.44 today -Suspect prerenal-she does not admit  to poor oral intake but notes from last admission state that her oral intake was poor but she was noted to have some urinary retention as well and foley is in place -- Renal ultrasound unremarkable - home benicar  held - Foley placed for accurate input/output -- CK is not elevated -- treated with IV fluids with D5 and avoid nephrotoxic agents  Acute urinary retention --foley cath remains in place since she is going to OR today --plan to remove foley cath after surgery and follow voiding   Mild hypoglycemia - resolved after dextrose  fluids and diet   Oral stomatitis -- reporting mouth pain, ordered for magic mouthwash PRN, soft foods diet    Acute hypoxic respiratory failure - Pulse ox is 88 to 89%-EMS placed 2 L of oxygen on her - The patient does wear oxygen at home during nighttime - Portable chest x-ray that was performed earlier shows left basilar consolidation - Suspect she has atelectasis secondary to not being out of bed for days and this is likely superimposed on COPD     COPD-ongoing cigarette use - The patient states that she has never been told that she has emphysema or COPD and continues to smoke about half to 3/4 pack/day - She has seen [facility] in the past, seen by Dr. Waymond Hailey and is maintained on Advair and albuterol  - She declines a nicotine patch - Smoking cessation counseling done   Leukocytosis-resolved  - Acute  stress response?  Recently was treated for pneumonia but overall no signs of infection at this time - UA negative - WBC 10.3      OSA (obstructive sleep apnea) - CPAP ordered nightly   Colostomy with history of colocutaneous fistula - She is able to manage her colostomy herself   Obesity Estimated body mass index is 38.52 kg/m as calculated from the following:   Height as of this encounter: 5' 5 (1.651 m).   Weight as of this encounter: 105 kg.   DVT prophylaxis: SQ heparin  Code Status: Full  Communication: discussed plan of care in  detail with patient today who verbalized understanding Disposition: TBD     Consultants:  Orthopedics   Procedures:  Tentative orthopedic surgery for 6/20  Antimicrobials:    Subjective: Pt reports ready for surgery today; pain controlled.    Objective: Vitals:   06/12/24 0548 06/12/24 0854 06/12/24 1119 06/12/24 1130  BP: (!) 150/81  108/64 111/61  Pulse: 95  86 82  Resp: 18  12   Temp: 97.9 F (36.6 C)  98.5 F (36.9 C)   TempSrc: Oral  Oral   SpO2: 98% 95% 97% 95%  Weight:   105 kg   Height:   5' 5 (1.651 m)     Intake/Output Summary (Last 24 hours) at 06/12/2024 1309 Last data filed at 06/12/2024 1258 Gross per 24 hour  Intake 403.47 ml  Output 1250 ml  Net -846.53 ml   Filed Weights   06/07/24 1700 06/12/24 1119  Weight: 105 kg 105 kg   Examination:  General exam: Appears calm and comfortable  Respiratory system: Clear to auscultation. Respiratory effort normal. Cardiovascular system: normal S1 & S2 heard. No JVD, murmurs, rubs, gallops or clicks. No pedal edema. Gastrointestinal system: Abdomen is nondistended, soft and nontender. No organomegaly or masses felt. Normal bowel sounds heard. Central nervous system: Alert and oriented. No focal neurological deficits. Extremities: left femur swollen, rotated, distal pulses palpable BLEs Skin: No rashes, lesions or ulcers. Psychiatry: Judgement and insight appear normal. Mood & affect appropriate.   Data Reviewed: I have personally reviewed following labs and imaging studies  CBC: Recent Labs  Lab 06/07/24 1710 06/08/24 0638 06/09/24 0442 06/10/24 0423 06/11/24 0421 06/12/24 0426  WBC 13.8* 13.3* 11.4* 10.1 10.3 14.9*  NEUTROABS 11.7*  --   --   --   --   --   HGB 10.8* 9.4* 9.4* 9.7* 9.8* 10.4*  HCT 31.7* 29.7* 30.8* 31.2* 31.7* 34.6*  MCV 96.6 100.0 101.7* 102.6* 103.9* 102.4*  PLT 289 251 195 138* 140* 104*    Basic Metabolic Panel: Recent Labs  Lab 06/07/24 1710 06/08/24 0517 06/09/24 0442  06/10/24 0423 06/11/24 0421 06/11/24 0451 06/12/24 0426 06/12/24 0812 06/12/24 1036  NA 133* 136 138 139 137  --  137  --   --   K 5.3* 4.4 4.8 4.9 4.9  --  5.6* 5.5* 5.1  CL 96* 107 107 109 108  --  109  --   --   CO2 22 17* 22 24 24   --  25  --   --   GLUCOSE 66* 58* 78 79 84  --  95  --   --   BUN 117* 100* 80* 71* 54*  --  43*  --   --   CREATININE 4.50* 3.53* 2.73* 2.02* 1.66*  --  1.44*  --   --   CALCIUM  8.5* 7.6* 8.2* 8.3* 8.6*  --  8.9  --   --  MG 2.1  --  1.8 1.6*  --  2.0  --   --   --     CBG: Recent Labs  Lab 06/11/24 1114 06/11/24 1701 06/11/24 2346 06/12/24 0550 06/12/24 1031  GLUCAP 95 95 120* 79 97    Recent Results (from the past 240 hours)  Surgical pcr screen     Status: None   Collection Time: 06/10/24  4:00 PM   Specimen: Nasal Mucosa; Nasal Swab  Result Value Ref Range Status   MRSA, PCR NEGATIVE NEGATIVE Final   Staphylococcus aureus NEGATIVE NEGATIVE Final    Comment: (NOTE) The Xpert SA Assay (FDA approved for NASAL specimens in patients 36 years of age and older), is one component of a comprehensive surveillance program. It is not intended to diagnose infection nor to guide or monitor treatment. Performed at [facility], [address] [place], [place] [address]      Radiology Studies: No results found.  Scheduled Meds:  [MAR Hold] allopurinol   300 mg Oral Daily   [MAR Hold] Chlorhexidine  Gluconate Cloth  6 each Topical Daily   [MAR Hold] fluticasone  furoate-vilanterol  1 puff Inhalation Daily   [MAR Hold] gabapentin   300 mg Oral QHS   [MAR Hold] levothyroxine   137 mcg Oral Q0600   [MAR Hold] nystatin    Topical BID   [MAR Hold] rosuvastatin   10 mg Oral Daily   [MAR Hold] senna-docusate  2 tablet Oral QHS   Continuous Infusions:  ceFAZolin     clindamycin      lactated ringers        LOS: 5 days   Time spent: 55 mins  Marjoria Mancillas Lincoln Renshaw, MD How to contact the [facility] Attending or Consulting provider 7A - 7P or covering provider  during after hours 7P -7A, for this patient?  Check the care team in [facility] and look for a) attending/consulting [facility] provider listed and b) the TR[facility] team listed Log into [email or web address] to find provider on call.  Locate the TR[facility] provider you are looking for under [facility] and page to a number that you can be directly reached. If you still have difficulty reaching the provider, please page the [facility] (Director on Call) for the Hospitalists listed on amion for assistance.  06/12/2024, 1:09 PM

## 2024-06-12 NOTE — Plan of Care (Signed)

## 2024-06-13 DIAGNOSIS — G4733 Obstructive sleep apnea (adult) (pediatric): Secondary | ICD-10-CM | POA: Diagnosis not present

## 2024-06-13 DIAGNOSIS — E039 Hypothyroidism, unspecified: Secondary | ICD-10-CM | POA: Diagnosis not present

## 2024-06-13 DIAGNOSIS — S72452G Displaced supracondylar fracture without intracondylar extension of lower end of left femur, subsequent encounter for closed fracture with delayed healing: Secondary | ICD-10-CM | POA: Diagnosis not present

## 2024-06-13 DIAGNOSIS — N179 Acute kidney failure, unspecified: Secondary | ICD-10-CM | POA: Diagnosis not present

## 2024-06-13 LAB — CBC
HCT: 28.7 % — ABNORMAL LOW (ref 36.0–46.0)
Hemoglobin: 8.6 g/dL — ABNORMAL LOW (ref 12.0–15.0)
MCH: 30.8 pg (ref 26.0–34.0)
MCHC: 30 g/dL (ref 30.0–36.0)
MCV: 102.9 fL — ABNORMAL HIGH (ref 80.0–100.0)
Platelets: 64 10*3/uL — ABNORMAL LOW (ref 150–400)
RBC: 2.79 MIL/uL — ABNORMAL LOW (ref 3.87–5.11)
RDW: 15.5 % (ref 11.5–15.5)
WBC: 21.9 10*3/uL — ABNORMAL HIGH (ref 4.0–10.5)
nRBC: 0 % (ref 0.0–0.2)

## 2024-06-13 LAB — BASIC METABOLIC PANEL WITH GFR
Anion gap: 4 — ABNORMAL LOW (ref 5–15)
BUN: 36 mg/dL — ABNORMAL HIGH (ref 8–23)
CO2: 24 mmol/L (ref 22–32)
Calcium: 8.3 mg/dL — ABNORMAL LOW (ref 8.9–10.3)
Chloride: 106 mmol/L (ref 98–111)
Creatinine, Ser: 1.27 mg/dL — ABNORMAL HIGH (ref 0.44–1.00)
GFR, Estimated: 45 mL/min — ABNORMAL LOW (ref >60)
Glucose, Bld: 111 mg/dL — ABNORMAL HIGH (ref 70–99)
Potassium: 5.5 mmol/L — ABNORMAL HIGH (ref 3.5–5.1)
Sodium: 134 mmol/L — ABNORMAL LOW (ref 135–145)

## 2024-06-13 LAB — GLUCOSE, CAPILLARY
Glucose-Capillary: 125 mg/dL — ABNORMAL HIGH (ref 70–99)
Glucose-Capillary: 119 mg/dL — ABNORMAL HIGH (ref 70–99)
Glucose-Capillary: 119 mg/dL — ABNORMAL HIGH (ref 70–99)

## 2024-06-13 MED ORDER — SODIUM ZIRCONIUM CYCLOSILICATE 10 G PO PACK
10.0000 g | PACK | Freq: Three times a day (TID) | ORAL | Status: DC
  Administered 2024-06-13 (×2): 10 g via ORAL
  Filled 2024-06-13 (×2): qty 1

## 2024-06-13 MED ORDER — ENOXAPARIN SODIUM 40 MG/0.4ML IJ SOSY
40.0000 mg | PREFILLED_SYRINGE | INTRAMUSCULAR | Status: DC
  Administered 2024-06-13 – 2024-06-14 (×2): 40 mg via SUBCUTANEOUS
  Filled 2024-06-13 (×2): qty 0.4

## 2024-06-13 MED ORDER — SODIUM ZIRCONIUM CYCLOSILICATE 10 G PO PACK: 10.0000 g | PACK | Freq: Three times a day (TID) | ORAL | Status: DC

## 2024-06-13 MED ORDER — SODIUM ZIRCONIUM CYCLOSILICATE 10 G PO PACK: 10.0000 g | PACK | Freq: Every day | ORAL | Status: DC

## 2024-06-13 MED ORDER — SODIUM ZIRCONIUM CYCLOSILICATE 10 G PO PACK
10.0000 g | PACK | Freq: Every day | ORAL | Status: DC
  Administered 2024-06-14: 10 g via ORAL
  Filled 2024-06-13: qty 1

## 2024-06-13 MED ORDER — SODIUM ZIRCONIUM CYCLOSILICATE 10 G PO PACK
10.0000 g | PACK | Freq: Three times a day (TID) | ORAL | Status: AC
  Administered 2024-06-13: 10 g via ORAL
  Filled 2024-06-13: qty 1

## 2024-06-13 NOTE — Plan of Care (Signed)
  Problem: Acute Rehab PT Goals(only PT should resolve) Goal: Pt Will Go Supine/Side To Sit Outcome: Progressing Flowsheets (Taken 06/13/2024 0935) Pt will go Supine/Side to Sit:  with moderate assist  with HOB elevated Goal: Patient Will Transfer Sit To/From Stand Outcome: Progressing Flowsheets (Taken 06/13/2024 0935) Patient will transfer sit to/from stand:  with moderate assist  with maximum assist Goal: Pt Will Transfer Bed To Chair/Chair To Bed Outcome: Progressing Flowsheets (Taken 06/13/2024 0935) Pt will Transfer Bed to Chair/Chair to Bed:  with mod assist  with max assist Goal: Pt Will Ambulate Outcome: Progressing Flowsheets (Taken 06/13/2024 0935) Pt will Ambulate:  25 feet  with moderate assist  with maximum assist  with rolling walker   Lang Ada, PT, DPT [facility] Office: [phone] 9:36 AM, 06/13/24

## 2024-06-13 NOTE — Progress Notes (Signed)
 Patient reported that lower dentures where missing. Patient received upper and lower dentures this morning prior to eating breakfast. Patient requested adhesive for dentures, adhesive provided to patient per request. NT reported patient asked later for mouth rinse to clean dentures, patient provided supplies. CN made aware. Patient reported to staff that she was unsure what she had done with her lower dentures. Patients room, trash and linen went though my staff. Unable to find lower dentures at this time. Patients upper dentures still present.

## 2024-06-13 NOTE — Anesthesia Postprocedure Evaluation (Signed)
 Anesthesia Post Note  Patient: [name]  Procedure(s) Performed: INSERTION, INTRAMEDULLARY ROD, FEMUR, RETROGRADE (Left: Leg Upper)  Patient location during evaluation: Phase II Anesthesia Type: Spinal Level of consciousness: awake Pain management: pain level controlled Vital Signs Assessment: post-procedure vital signs reviewed and stable Respiratory status: spontaneous breathing and respiratory function stable Cardiovascular status: blood pressure returned to baseline and stable Postop Assessment: no headache and no apparent nausea or vomiting Anesthetic complications: no Comments: Late entry   No notable events documented.   Last Vitals:  Vitals:   06/13/24 0827 06/13/24 0920  BP: (!) 112/56 (!) 112/56  Pulse:  71  Resp:    Temp:    SpO2:  97%    Last Pain:  Vitals:   06/13/24 0856  TempSrc:   PainSc: 5                  Yvonna JINNY Bosworth

## 2024-06-13 NOTE — Progress Notes (Signed)
 PROGRESS NOTE   Christina Castaneda  FMW:[id number] DOB: [birth year] DOA: 06/07/2024 PCP: [name], DO   Chief Complaint  Patient presents with   Fall   Level of care: Med-Surg  Brief Admission History:  68 y.o. female with medical history of nicotine abuse, HTN, hypothyroidism who fell while trying to sit on her toilet at home in the middle of the night on Tuesday.  She was not able to get up and EMS was called.  She was transferred from the bathroom to her bed by EMS who then left her at home.  Since that date she has not been able to stand or ambulate.  She was just discharged from the hospital on June 8.  She states she had a touch of pneumonia at that time.   Patient had imaging studies demonstrating acute comminuted fracture of the distal left femur and she is noted to have severe AKI with hyperkalemia as well likely prerenal in the setting of poor oral intake along with use of ACE inhibitor.  Plans are for likely orthopedic intervention by 6/19 once AKI further improves.   Assessment and Plan:  Left femur fracture - Acute comminuted fracture of distal femur with moderate to large joint effusion -- She likely obtained this fracture on 6/10 when she fell while trying to get on the commode - High risk for DVT as she has not been out of bed since fall on Tuesday- continue Heparin  prophylaxis until surgery can be performed - Patient okay to remain at [facility]  Spoke with Dr. Onesimo who is agreeable for operative intervention here which is planned for 6/20, confirmed with surgeon.    AKI, hyperkalemia in setting of ACE inhibitor use-actively being treated  --K up to 5.6 this morning, confirmed with repeat test, treated with lokelma , lasix , dextrose /insulin , down to 5.1 prior to surgery today, continue to follow daily labs -- baseline Cr 0.8 -Initial labs showed BUN 117 with a creatinine of 4.50 and a potassium of 5.3 --creatinine improved to 1.44 today -Suspect prerenal-she  does not admit to poor oral intake but notes from last admission state that her oral intake was poor but she was noted to have some urinary retention as well and foley is in place -- Renal ultrasound unremarkable - home benicar  held - Foley placed for accurate input/output -- CK is not elevated -- treated with IV fluids with D5 and avoid nephrotoxic agents -- lokelma  TID x 3 doses today followed by once daily   Acute urinary retention --foley cath remains in place since she is going to OR today --plan to remove foley cath after surgery and follow voiding   Mild hypoglycemia - resolved after dextrose  fluids and diet   Oral stomatitis -- reporting mouth pain, ordered for magic mouthwash PRN, soft foods diet    Acute hypoxic respiratory failure - Pulse ox is 88 to 89%-EMS placed 2 L of oxygen on her - The patient does wear oxygen at home during nighttime - Portable chest x-ray that was performed earlier shows left basilar consolidation - Suspect she has atelectasis secondary to not being out of bed for days and this is likely superimposed on COPD     COPD-ongoing cigarette use - The patient states that she has never been told that she has emphysema or COPD and continues to smoke about half to 3/4 pack/day - She has seen [facility] in the past, seen by Dr. Darlean and is maintained on Advair and albuterol  - She  declines a nicotine patch - Smoking cessation counseling done   Leukocytosis-resolved  - Acute stress response?  Recently was treated for pneumonia but overall no signs of infection at this time - UA negative - WBC 10.3      OSA (obstructive sleep apnea) - CPAP ordered nightly   Colostomy with history of colocutaneous fistula - She is able to manage her colostomy herself   Obesity Estimated body mass index is 38.52 kg/m as calculated from the following:   Height as of this encounter: 5' 5 (1.651 m).   Weight as of this encounter: 105 kg.   DVT prophylaxis: SQ  heparin  Code Status: Full  Communication: discussed plan of care in detail with patient today who verbalized understanding Disposition: TBD     Consultants:  Orthopedics   Procedures:  Tentative orthopedic surgery for 6/20  Antimicrobials:    Subjective: Pt tolerated surgery well.  Working with PT.    Objective: Vitals:   06/13/24 0757 06/13/24 0827 06/13/24 0920 06/13/24 1345  BP:  (!) 112/56 (!) 112/56 (!) 116/53  Pulse:   71 70  Resp:      Temp:    97.8 F (36.6 C)  TempSrc:    Oral  SpO2: 92%  97% 98%  Weight:      Height:        Intake/Output Summary (Last 24 hours) at 06/13/2024 1703 Last data filed at 06/13/2024 1345 Gross per 24 hour  Intake 720 ml  Output 550 ml  Net 170 ml   Filed Weights   06/07/24 1700 06/12/24 1119  Weight: 105 kg 105 kg   Examination:  General exam: Appears calm and comfortable  Respiratory system: Clear to auscultation. Respiratory effort normal. Cardiovascular system: normal S1 & S2 heard. No JVD, murmurs, rubs, gallops or clicks. No pedal edema. Gastrointestinal system: Abdomen is nondistended, soft and nontender. No organomegaly or masses felt. Normal bowel sounds heard. Central nervous system: Alert and oriented. No focal neurological deficits. Extremities: left leg swollen in bandages Skin: No rashes, lesions or ulcers. Psychiatry: Judgement and insight appear normal. Mood & affect appropriate.   Data Reviewed: I have personally reviewed following labs and imaging studies  CBC: Recent Labs  Lab 06/07/24 1710 06/08/24 0638 06/09/24 0442 06/10/24 0423 06/11/24 0421 06/12/24 0426 06/13/24 0403  WBC 13.8*   < > 11.4* 10.1 10.3 14.9* 21.9*  NEUTROABS 11.7*  --   --   --   --   --   --   HGB 10.8*   < > 9.4* 9.7* 9.8* 10.4* 8.6*  HCT 31.7*   < > 30.8* 31.2* 31.7* 34.6* 28.7*  MCV 96.6   < > 101.7* 102.6* 103.9* 102.4* 102.9*  PLT 289   < > 195 138* 140* 104* 64*   < > = values in this interval not displayed.     Basic Metabolic Panel: Recent Labs  Lab 06/07/24 1710 06/08/24 0517 06/09/24 0442 06/10/24 0423 06/11/24 0421 06/11/24 0451 06/12/24 0426 06/12/24 0812 06/12/24 1036 06/13/24 0403  NA 133*   < > 138 139 137  --  137  --   --  134*  K 5.3*   < > 4.8 4.9 4.9  --  5.6* 5.5* 5.1 5.5*  CL 96*   < > 107 109 108  --  109  --   --  106  CO2 22   < > 22 24 24   --  25  --   --  24  GLUCOSE  66*   < > 78 79 84  --  95  --   --  111*  BUN 117*   < > 80* 71* 54*  --  43*  --   --  36*  CREATININE 4.50*   < > 2.73* 2.02* 1.66*  --  1.44*  --   --  1.27*  CALCIUM  8.5*   < > 8.2* 8.3* 8.6*  --  8.9  --   --  8.3*  MG 2.1  --  1.8 1.6*  --  2.0  --   --   --   --    < > = values in this interval not displayed.    CBG: Recent Labs  Lab 06/12/24 0550 06/12/24 1031 06/12/24 1849 06/13/24 0122 06/13/24 1109  GLUCAP 79 97 100* 125* 119*    Recent Results (from the past 240 hours)  Surgical pcr screen     Status: None   Collection Time: 06/10/24  4:00 PM   Specimen: Nasal Mucosa; Nasal Swab  Result Value Ref Range Status   MRSA, PCR NEGATIVE NEGATIVE Final   Staphylococcus aureus NEGATIVE NEGATIVE Final    Comment: (NOTE) The Xpert SA Assay (FDA approved for NASAL specimens in patients 70 years of age and older), is one component of a comprehensive surveillance program. It is not intended to diagnose infection nor to guide or monitor treatment. Performed at [facility], [address], [place], [place] [address]      Radiology Studies: DG FEMUR MIN 2 VIEWS LEFT Result Date: 06/12/2024 CLINICAL DATA:  Left femur fracture EXAM: LEFT FEMUR 2 VIEWS COMPARISON:  06/07/2024 FINDINGS: Eight fluoroscopic images are obtained during the performance of the procedure and are provided for interpretation only. Intramedullary rod with proximal and distal interlocking screws traverse a comminuted distal left femoral metadiaphyseal junction fracture, with near anatomic alignment on the final  images. Please refer to the operative report. Fluoroscopy time: 2 minute 28 seconds, 30.71 mGy IMPRESSION: 1. ORIF left femur fracture as above. Electronically Signed   By: Ozell Daring M.D.   On: 06/12/2024 18:54   DG FEMUR MIN 2 VIEWS LEFT Result Date: 06/12/2024 CLINICAL DATA:  Intramedullary nail fixation EXAM: LEFT FEMUR 2 VIEWS COMPARISON:  None Available. FINDINGS: Intramedullary nail fixation of comminuted fracture of the distal femur. Three distal locking screws. IMPRESSION: Internal fixation of distal femur fracture Electronically Signed   By: Jackquline Boxer M.D.   On: 06/12/2024 18:25   DG C-Arm 1-60 Min-No Report Result Date: 06/12/2024 Fluoroscopy was utilized by the requesting physician.  No radiographic interpretation.   DG C-Arm 1-60 Min-No Report Result Date: 06/12/2024 Fluoroscopy was utilized by the requesting physician.  No radiographic interpretation.    Scheduled Meds:  allopurinol   300 mg Oral Daily   Chlorhexidine  Gluconate Cloth  6 each Topical Daily   enoxaparin  (LOVENOX ) injection  40 mg Subcutaneous Q24H   fluticasone  furoate-vilanterol  1 puff Inhalation Daily   gabapentin   300 mg Oral QHS   levothyroxine   137 mcg Oral Q0600   nystatin    Topical BID   rosuvastatin   10 mg Oral Daily   senna-docusate  2 tablet Oral QHS   sodium zirconium cyclosilicate   10 g Oral TID   Continuous Infusions:   LOS: 6 days   Time spent: 55 mins  Christina Rohrig Vicci, MD How to contact the [facility] Attending or Consulting provider 7A - 7P or covering provider during after hours 7P -7A, for this patient?  Check the  care team in [facility] and look for a) attending/consulting TR[facility] provider listed and b) the TRH team listed Log into [email or web address] to find provider on call.  Locate the TR[facility] provider you are looking for under [facility] and page to a number that you can be directly reached. If you still have difficulty reaching the provider, please page the [facility] (Director on Call) for the  Hospitalists listed on amion for assistance.  06/13/2024, 5:03 PM

## 2024-06-13 NOTE — Progress Notes (Signed)
   ORTHOPAEDIC PROGRESS NOTE  S/p Procedure(s): Retrograde left femoral nail  DOS: 06/12/2024  SUBJECTIVE:Pain is controlled.  She is anxious about working with PT.  No issues over night.   OBJECTIVE: PE:  Alert and oriented, no acute distress  Left leg with diffuse swelling Brace in place Toes [facility] Active motion intact to TA/EHL  Vitals:   06/13/24 0827 06/13/24 0920  BP: (!) 112/56 (!) 112/56  Pulse:  71  Resp:    Temp:    SpO2:  97%      Latest Ref Rng & Units 06/13/2024    4:03 AM 06/12/2024    4:26 AM 06/11/2024    4:21 AM  CBC  WBC 4.0 - 10.5 K/uL 21.9  14.9  10.3   Hemoglobin 12.0 - 15.0 g/dL 8.6  89.5  9.8   Hematocrit 36.0 - 46.0 % 28.7  34.6  31.7   Platelets 150 - 400 K/uL 64  104  140       Latest Ref Rng & Units 06/13/2024    4:03 AM 06/12/2024   10:36 AM 06/12/2024    8:12 AM  BMP  Glucose 70 - 99 mg/dL 888     BUN 8 - 23 mg/dL 36     Creatinine 9.55 - 1.00 mg/dL 8.72     Sodium 864 - 854 mmol/L 134     Potassium 3.5 - 5.1 mmol/L 5.5  5.1  5.5   Chloride 98 - 111 mmol/L 106     CO2 22 - 32 mmol/L 24     Calcium  8.9 - 10.3 mg/dL 8.3        ASSESSMENT: Christina Castaneda is a 68 y.o. female doing well following surgery  PLAN: Weightbearing: WBAT LLE; knee immobilizer on when ambulating Incisional and dressing care: Reinforce dressings as needed Orthopedic device(s): Knee immobilizer VTE prophylaxis: Ok to resume POD#1 Pain control: As needed Follow - up plan: 2 weeks  Staple removal in 2 weeks. Can be removed in a nursing facility.  Contact the clinic with any issues.     Contact information:     Alveda Vanhorne A. Onesimo, MD MS [facility] [address] [place],  [place]  [address] Phone: [phone] Fax: [phone]

## 2024-06-13 NOTE — Evaluation (Signed)
 Physical Therapy Evaluation Patient Details Name: Christina Castaneda MRN: [id number] DOB: September 28, 1956 Today's Date: 06/13/2024  History of Present Illness  Christina Castaneda is a 68 y.o. female with medical history of nicotine abuse, HTN, hypothyroidism who fell while trying to sit on her toilet at home in the middle of the night on Tuesday.  She was not able to get up and EMS was called.  She was transferred from the bathroom to her bed by EMS who then left her at home.  Since that date she has not been able to stand or ambulate.  She was just discharged from the hospital on June 8.  She states she had a touch of pneumonia at that time.     In the ED, she was found to have a left femoral fracture and AKI. Upon further questioning in regards to her AKI, she states that she occasionally takes aspirin and took maybe 4 doses of aspirin since her fall on Tuesday.  Has not taken any other NSAIDs.  She thinks she has been drinking enough fluid but is not sure.  She has been wearing a urine incontinence pad since her fall and she thinks she urinated earlier today prior to coming in the hospital.  No hematuria.  No flank pain.  No prior history of renal issues.   Clinical Impression  Patient demonstrates decreased LE strength, abnormal pain rating of LLE, and impaired balance. Pt demonstrates need for max assist for bed mobility and unable to stand at beside when attempted with max assist at this time. Pt extremely cautious and is hesitant to get out of bed due to fear of being in pain and falling again.  Pt unwilling to participate with therapy until pain meds, appreciate nursing assist. Pt educated on importance of early WB as this will help her recovery and return home.  Patient would benefit from skilled physical therapy for increased independence with bed mobility, progression towards functional transfers and ambulation for improved quality of life and progress towards therapy goals.        If plan is discharge  home, recommend the following: A lot of help with bathing/dressing/bathroom;A lot of help with walking and/or transfers;Help with stairs or ramp for entrance;Assistance with cooking/housework;Assist for transportation   Can travel by private vehicle   No    Equipment Recommendations None recommended by PT  Recommendations for Other Services       Functional Status Assessment Patient has had a recent decline in their functional status and demonstrates the ability to make significant improvements in function in a reasonable and predictable amount of time.     Precautions / Restrictions Precautions Precautions: Knee;Fall Recall of Precautions/Restrictions: Intact Required Braces or Orthoses: Knee Immobilizer - Left Restrictions Weight Bearing Restrictions Per Provider Order: Yes LLE Weight Bearing Per Provider Order: Weight bearing as tolerated      Mobility  Bed Mobility Overal bed mobility: Needs Assistance Bed Mobility: Supine to Sit     Supine to sit: Mod assist, Max assist     General bed mobility comments: increased time with frequent rest breaks, verbal/tactile cueing    Transfers                        Ambulation/Gait                  Stairs            Wheelchair Mobility     Tilt Bed  Modified Rankin (Stroke Patients Only)       Balance Overall balance assessment: Needs assistance Sitting-balance support: Feet supported, No upper extremity supported, Single extremity supported Sitting balance-Leahy Scale: Fair Sitting balance - Comments: pt requires at least one UE support at all times                                     Pertinent Vitals/Pain Pain Assessment Pain Assessment: 0-10 Pain Score: 5  Pain Location: left leg Pain Descriptors / Indicators: Sore, Sharp Pain Intervention(s): Limited activity within patient's tolerance    Home Living Family/patient expects to be discharged to:: Private  residence Living Arrangements: Spouse/significant other Available Help at Discharge: Family;Available PRN/intermittently Type of Home: House Home Access: Ramped entrance       Home Layout: One level Home Equipment: Agricultural consultant (2 wheels);Cane - single point;Shower seat;BSC/3in1      Prior Function Prior Level of Function : Independent/Modified Independent             Mobility Comments: SPC and RW for household ambulation ADLs Comments: Pt reports independence at baseline; drives; completes IADL's     Extremity/Trunk Assessment   Upper Extremity Assessment Upper Extremity Assessment: Overall WFL for tasks assessed    Lower Extremity Assessment Lower Extremity Assessment: Generalized weakness;LLE deficits/detail LLE Deficits / Details: knee immobilizer to be in place at this time. pt unable to lift LLE and requires assist to get to edge of bed    Cervical / Trunk Assessment Cervical / Trunk Assessment: Kyphotic  Communication   Communication Communication: No apparent difficulties    Cognition Arousal: Alert Behavior During Therapy: WFL for tasks assessed/performed   PT - Cognitive impairments: No apparent impairments                         Following commands: Intact       Cueing Cueing Techniques: Verbal cues     General Comments      Exercises     Assessment/Plan    PT Assessment Patient needs continued PT services  PT Problem List Decreased strength;Decreased activity tolerance;Decreased balance;Decreased mobility;Pain;Decreased range of motion;Obesity       PT Treatment Interventions DME instruction;Gait training;Stair training;Functional mobility training;Therapeutic activities;Therapeutic exercise;Balance training;Patient/family education;Neuromuscular re-education    PT Goals (Current goals can be found in the Care Plan section)  Acute Rehab PT Goals Patient Stated Goal: to get stronger before returning home PT Goal  Formulation: With patient/family Time For Goal Achievement: 06/27/24 Potential to Achieve Goals: Fair    Frequency Min 3X/week     Co-evaluation               AM-PAC PT 6 Clicks Mobility  Outcome Measure Help needed turning from your back to your side while in a flat bed without using bedrails?: A Lot Help needed moving from lying on your back to sitting on the side of a flat bed without using bedrails?: A Lot Help needed moving to and from a bed to a chair (including a wheelchair)?: Total Help needed standing up from a chair using your arms (e.g., wheelchair or bedside chair)?: Total Help needed to walk in hospital room?: Total Help needed climbing 3-5 steps with a railing? : Total 6 Click Score: 8    End of Session Equipment Utilized During Treatment: Gait belt Activity Tolerance: Patient limited by fatigue;Patient limited by pain Patient left:  in bed;with bed alarm set;with nursing/sitter in room Nurse Communication: Mobility status PT Visit Diagnosis: Other abnormalities of gait and mobility (R26.89);History of falling (Z91.81);Muscle weakness (generalized) (M62.81);Pain Pain - Right/Left: Left Pain - part of body: Hip;Knee;Leg    Time: 9192-9149 PT Time Calculation (min) (ACUTE ONLY): 43 min   Charges:   PT Evaluation $PT Eval Moderate Complexity: 1 Mod PT Treatments $Therapeutic Activity: 8-22 mins PT General Charges $$ ACUTE PT VISIT: 1 Visit         Lang Ada, PT, DPT [facility] Office: [phone] 9:34 AM, 06/13/24

## 2024-06-14 DIAGNOSIS — E039 Hypothyroidism, unspecified: Secondary | ICD-10-CM | POA: Diagnosis not present

## 2024-06-14 DIAGNOSIS — G4733 Obstructive sleep apnea (adult) (pediatric): Secondary | ICD-10-CM | POA: Diagnosis not present

## 2024-06-14 DIAGNOSIS — N179 Acute kidney failure, unspecified: Secondary | ICD-10-CM | POA: Diagnosis not present

## 2024-06-14 DIAGNOSIS — S72452G Displaced supracondylar fracture without intracondylar extension of lower end of left femur, subsequent encounter for closed fracture with delayed healing: Secondary | ICD-10-CM | POA: Diagnosis not present

## 2024-06-14 LAB — CBC
HCT: 23.3 % — ABNORMAL LOW (ref 36.0–46.0)
Hemoglobin: 7.3 g/dL — ABNORMAL LOW (ref 12.0–15.0)
MCH: 31.7 pg (ref 26.0–34.0)
MCHC: 31.3 g/dL (ref 30.0–36.0)
MCV: 101.3 fL — ABNORMAL HIGH (ref 80.0–100.0)
Platelets: DECREASED 10*3/uL (ref 150–400)
RBC: 2.3 MIL/uL — ABNORMAL LOW (ref 3.87–5.11)
RDW: 15.6 % — ABNORMAL HIGH (ref 11.5–15.5)
WBC: 18.1 10*3/uL — ABNORMAL HIGH (ref 4.0–10.5)
nRBC: 0.1 % (ref 0.0–0.2)

## 2024-06-14 LAB — GLUCOSE, CAPILLARY
Glucose-Capillary: 88 mg/dL (ref 70–99)
Glucose-Capillary: 88 mg/dL (ref 70–99)
Glucose-Capillary: 95 mg/dL (ref 70–99)
Glucose-Capillary: 98 mg/dL (ref 70–99)

## 2024-06-14 LAB — BASIC METABOLIC PANEL WITH GFR
Anion gap: 5 (ref 5–15)
BUN: 45 mg/dL — ABNORMAL HIGH (ref 8–23)
CO2: 25 mmol/L (ref 22–32)
Calcium: 8.5 mg/dL — ABNORMAL LOW (ref 8.9–10.3)
Chloride: 104 mmol/L (ref 98–111)
Creatinine, Ser: 1.18 mg/dL — ABNORMAL HIGH (ref 0.44–1.00)
GFR, Estimated: 50 mL/min — ABNORMAL LOW (ref >60)
Glucose, Bld: 82 mg/dL (ref 70–99)
Potassium: 4.4 mmol/L (ref 3.5–5.1)
Sodium: 134 mmol/L — ABNORMAL LOW (ref 135–145)

## 2024-06-14 LAB — MAGNESIUM: Magnesium: 1.5 mg/dL — ABNORMAL LOW (ref 1.7–2.4)

## 2024-06-14 LAB — VITAMIN D 25 HYDROXY (VIT D DEFICIENCY, FRACTURES): Vit D, 25-Hydroxy: 66.71 ng/mL (ref 30–100)

## 2024-06-14 LAB — HEMOGLOBIN AND HEMATOCRIT, BLOOD
HCT: 22.2 % — ABNORMAL LOW (ref 36.0–46.0)
Hemoglobin: 7 g/dL — ABNORMAL LOW (ref 12.0–15.0)

## 2024-06-14 MED ORDER — MAGNESIUM SULFATE 4 GM/100ML IV SOLN
4.0000 g | Freq: Once | INTRAVENOUS | Status: AC
  Administered 2024-06-14: 4 g via INTRAVENOUS
  Filled 2024-06-14: qty 100

## 2024-06-14 NOTE — NC FL2 (Signed)
 [place]  MEDICAID FL2 LEVEL OF CARE FORM     IDENTIFICATION  Patient Name: Christina Castaneda Birthdate: [birth year] Sex: female Admission Date (Current Location): 06/07/2024  [facility] and [place] Number:  [employer] and Address:  [facility],  [address], [place] [address]      Provider Number: [id number]  Attending Physician Name and Address:  Vicci Afton CROME, MD  Relative Name and Phone Number:  Jaquana, Geiger (husband) [phone]    Current Level of Care: Hospital Recommended Level of Care: Skilled Nursing Facility Prior Approval Number:    Date Approved/Denied:   PASRR Number: [id number]  Discharge Plan: SNF    Current Diagnoses: Patient Active Problem List   Diagnosis Date Noted   Femur fracture, left (HCC) 06/07/2024   AKI (acute kidney injury) (HCC) 05/27/2024   Hyperlipidemia 05/27/2024   Sepsis (HCC) 05/27/2024   Cigarette smoker 01/04/2023   Chronic venous insufficiency 02/21/2017   Lymphedema 02/21/2017   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   Adjustment disorder with mixed anxiety and depressed mood    Debility 05/02/2016   Edema    Generalized weakness    Chronic pain syndrome    Hypoalbuminemia due to protein-calorie malnutrition (HCC)    OSA (obstructive sleep apnea)    Morbid obesity due to excess calories (HCC)    S/P colostomy (HCC)    Tracheostomy status (HCC)    Tracheostomy care (HCC)    COPD  GOLD 2/AB    Tobacco abuse    Essential hypertension    Bilateral knee pain    Dysphagia    Acute blood loss anemia    Hyponatremia    Thyroid  activity decreased    Acute respiratory failure (HCC)    Pressure ulcer 04/01/2016   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   Hypothyroid 03/29/2016   Open wound of abdomen 03/29/2016   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   Complex endometrial hyperplasia 11/12/2012   Obesity 11/12/2012    Orientation RESPIRATION BLADDER Height & Weight     Self, Time, Situation,  Place  Normal Incontinent Weight: 105 kg Height:  5' 5 (165.1 cm)  BEHAVIORAL SYMPTOMS/MOOD NEUROLOGICAL BOWEL NUTRITION STATUS      Colostomy Diet (see dc summary)  AMBULATORY STATUS COMMUNICATION OF NEEDS Skin   Extensive Assist Verbally Surgical wounds (Redness to abd, back, sacrum. Surgical site to left leg/hip.)                       Personal Care Assistance Level of Assistance  Bathing, Feeding, Dressing Bathing Assistance: Limited assistance Feeding assistance: Independent Dressing Assistance: Limited assistance     Functional Limitations Info  Sight, Hearing, Speech Sight Info: Impaired Hearing Info: Adequate Speech Info: Adequate    SPECIAL CARE FACTORS FREQUENCY  PT (By licensed PT), OT (By licensed OT)     PT Frequency: 5 times weekly OT Frequency: 5 times weekly            Contractures Contractures Info: Not present    Additional Factors Info  Code Status, Allergies Code Status Info: Full Allergies Info: Penicillins, Biphosphate, Fluticasone            Current Medications (06/14/2024):  This is the current hospital active medication list Current Facility-Administered Medications  Medication Dose Route Frequency Provider Last Rate Last Admin   acetaminophen  (TYLENOL ) tablet 650 mg  650 mg Oral Q6H PRN Onesimo Oneil LABOR, MD   650 mg at 06/14/24 9486   Or   acetaminophen  (TYLENOL )  suppository 650 mg  650 mg Rectal Q6H PRN Onesimo Oneil LABOR, MD       allopurinol  (ZYLOPRIM ) tablet 300 mg  300 mg Oral Daily Onesimo Oneil LABOR, MD   300 mg at 06/14/24 9170   Chlorhexidine  Gluconate Cloth 2 % PADS 6 each  6 each Topical Daily Onesimo Oneil LABOR, MD   6 each at 06/14/24 0831   enoxaparin  (LOVENOX ) injection 40 mg  40 mg Subcutaneous Q24H Johnson, Clanford L, MD   40 mg at 06/14/24 1611   fluticasone  furoate-vilanterol (BREO ELLIPTA ) 200-25 MCG/ACT 1 puff  1 puff Inhalation Daily Onesimo Oneil LABOR, MD   1 puff at 06/14/24 0729   gabapentin  (NEURONTIN ) capsule 300 mg  300 mg  Oral QHS Onesimo Oneil LABOR, MD   300 mg at 06/13/24 2201   HYDROmorphone  (DILAUDID ) injection 0.5-1 mg  0.5-1 mg Intravenous Q4H PRN Onesimo Oneil LABOR, MD   1 mg at 06/14/24 1611   levothyroxine  (SYNTHROID ) tablet 137 mcg  137 mcg Oral Q0600 Onesimo Oneil LABOR, MD   137 mcg at 06/14/24 9485   magic mouthwash w/lidocaine   15 mL Oral QID PRN Onesimo Oneil LABOR, MD       nystatin  (MYCOSTATIN /NYSTOP ) topical powder   Topical BID Onesimo Oneil LABOR, MD   Given at 06/14/24 0831   ondansetron  (ZOFRAN ) tablet 4 mg  4 mg Oral Q6H PRN Onesimo Oneil LABOR, MD   4 mg at 06/11/24 1716   Or   ondansetron  (ZOFRAN ) injection 4 mg  4 mg Intravenous Q6H PRN Onesimo Oneil LABOR, MD   4 mg at 06/12/24 1417   Oral care mouth rinse  15 mL Mouth Rinse PRN Onesimo Oneil LABOR, MD       oxyCODONE  (Oxy IR/ROXICODONE ) immediate release tablet 5 mg  5 mg Oral Q4H PRN Onesimo Oneil LABOR, MD   5 mg at 06/12/24 1916   rosuvastatin  (CRESTOR ) tablet 10 mg  10 mg Oral Daily Onesimo Oneil LABOR, MD   10 mg at 06/14/24 9170   senna-docusate (Senokot-S) tablet 2 tablet  2 tablet Oral QHS Onesimo Oneil LABOR, MD   2 tablet at 06/13/24 2201     Discharge Medications: Please see discharge summary for a list of discharge medications.  Relevant Imaging Results:  Relevant Lab Results:   Additional Information SSN: [id number]  Nena LITTIE Coffee, RN

## 2024-06-14 NOTE — TOC Initial Note (Signed)
 Transition of Care [facility]) - Initial/Assessment Note    Patient Details  Name: Christina Castaneda MRN: [id number] Date of Birth: 1956-01-20  Transition of Care [facility]) CM/SW Contact:    Nena LITTIE Coffee, RN Phone Number: 06/14/2024, 4:31 PM  Clinical Narrative:                 SNF recommended and pt is agreeable.   Expected Discharge Plan: Skilled Nursing Facility Barriers to Discharge: Continued Medical Work up, Other (must enter comment) (SNF auth pending)  Patient Goals and CMS Choice Patient states their goals for this hospitalization and ongoing recovery are:: Strengthening CMS Medicare.gov Compare Post Acute Care list provided to:: Patient Choice offered to / list presented to : Patient [place] ownership interest in [facility].provided to:: Patient   Expected Discharge Plan and Services In-house Referral: Clinical Social Work Discharge Planning Services: CM Consult Post Acute Care Choice: Skilled Nursing Facility Living arrangements for the past 2 months: Single Family Home    Prior Living Arrangements/Services Living arrangements for the past 2 months: Single Family Home Lives with:: Self Patient language and need for interpreter reviewed:: Yes Do you feel safe going back to the place where you live?: No      Need for Family Participation in Patient Care: Yes (Comment) Care giver support system in place?: Yes (comment)   Criminal Activity/Legal Involvement Pertinent to Current Situation/Hospitalization: No - Comment as needed  Activities of Daily Living   ADL Screening (condition at time of admission) Independently performs ADLs?: No Does the patient have a NEW difficulty with bathing/dressing/toileting/self-feeding that is expected to last >3 days?: No Does the patient have a NEW difficulty with getting in/out of bed, walking, or climbing stairs that is expected to last >3 days?: No Does the patient have a NEW difficulty with communication that is expected to  last >3 days?: No Is the patient deaf or have difficulty hearing?: No Does the patient have difficulty seeing, even when wearing glasses/contacts?: No Does the patient have difficulty concentrating, remembering, or making decisions?: No  Permission Sought/Granted Permission sought to share information with : Case Manager, Magazine features editor Permission granted to share information with : Yes, Verbal Permission Granted  Emotional Assessment Appearance:: Appears stated age Attitude/Demeanor/Rapport: Engaged Affect (typically observed): Appropriate Orientation: : Oriented to Self, Oriented to Place, Oriented to  Time, Oriented to Situation Alcohol / Substance Use: Not Applicable Psych Involvement: No (comment)  Admission diagnosis:  Femur fracture, left (HCC) [S72.92XA] AKI (acute kidney injury) (HCC) [N17.9] Closed fracture of distal end of left femur with malunion, unspecified fracture morphology, subsequent encounter [S72.402P] Patient Active Problem List   Diagnosis Date Noted   Femur fracture, left (HCC) 06/07/2024   AKI (acute kidney injury) (HCC) 05/27/2024    Class: Acute   Hyperlipidemia 05/27/2024   Sepsis (HCC) 05/27/2024   Cigarette smoker 01/04/2023   Chronic venous insufficiency 02/21/2017   Lymphedema 02/21/2017   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   Adjustment disorder with mixed anxiety and depressed mood    Debility 05/02/2016   Edema    Generalized weakness    Chronic pain syndrome    Hypoalbuminemia due to protein-calorie malnutrition (HCC)    OSA (obstructive sleep apnea)    Morbid obesity due to excess calories (HCC)    S/P colostomy (HCC)    Tracheostomy status (HCC)    Tracheostomy care (HCC)    COPD  GOLD 2/AB    Tobacco abuse    Essential hypertension  Bilateral knee pain    Dysphagia    Acute blood loss anemia    Hyponatremia    Thyroid  activity decreased    Acute respiratory failure (HCC)    Pressure ulcer 04/01/2016    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 - [place], [place] - [address] [place] [address] Phone: [phone] Fax: [phone] Social Drivers of Health (SDOH) Social History: SDOH Screenings   Food Insecurity: No Food Insecurity (06/08/2024)  Housing: Low Risk  (06/08/2024)  Transportation Needs: No Transportation Needs (06/08/2024)  Utilities: Not At Risk (06/08/2024)  Social Connections: Unknown (06/08/2024)  Recent Concern: Social Connections - Moderately Isolated (05/27/2024)  Tobacco Use: High Risk (06/07/2024)   SDOH Interventions: Social Connections Interventions: Intervention Not Indicated  Readmission Risk Interventions    05/27/2024   10:01 PM  Readmission Risk Prevention Plan  Post Dischage Appt Complete  Medication Screening Complete  Transportation Screening Complete

## 2024-06-14 NOTE — Progress Notes (Signed)
 PROGRESS NOTE   Christina Castaneda  FMW:[id number] DOB: 1956-10-11 DOA: 06/07/2024 PCP: [name], DO   Chief Complaint  Patient presents with   Fall   Level of care: Med-Surg  Brief Admission History:  68 y.o. female with medical history of nicotine abuse, HTN, hypothyroidism who fell while trying to sit on her toilet at home in the middle of the night on Tuesday.  She was not able to get up and EMS was called.  She was transferred from the bathroom to her bed by EMS who then left her at home.  Since that date she has not been able to stand or ambulate.  She was just discharged from the hospital on June 8.  She states she had a touch of pneumonia at that time.   Patient had imaging studies demonstrating acute comminuted fracture of the distal left femur and she is noted to have severe AKI with hyperkalemia as well likely prerenal in the setting of poor oral intake along with use of ACE inhibitor.  Plans are for likely orthopedic intervention by 6/19 once AKI further improves.   Assessment and Plan:  Left femur fracture - Acute comminuted fracture of distal femur with moderate to large joint effusion -- She likely obtained this fracture on 6/10 when she fell while trying to get on the commode - High risk for DVT as she has not been out of bed since fall on Tuesday- continue Heparin  prophylaxis until surgery can be performed - postop s/p Retrograde left femoral nail on 6/20 with Dr. Onesimo, continue postop care and resumed DVT prophylaxis. Working on SNF placement.    AKI, hyperkalemia in setting of ACE inhibitor use-actively being treated  --K up to 5.6 on 6/21, confirmed with repeat test, treated with lokelma , lasix , dextrose /insulin , down to 5.1 prior to surgery today, continue to follow daily labs -- baseline Cr 0.8 -Initial labs showed BUN 117 with a creatinine of 4.50 and a potassium of 5.3 --creatinine improved to 1.18 today -Suspect prerenal-she does not admit to poor oral  intake but notes from last admission state that her oral intake was poor but she was noted to have some urinary retention as well and foley is in place -- Renal ultrasound unremarkable - home benicar  held - Foley placed for accurate input/output -- CK is not elevated -- treated with IV fluids with D5 and avoid nephrotoxic agents -- lokelma  TID x 3 doses today followed by once daily   Acute urinary retention --foley cath remains in place since she is going to OR today --plan to remove foley cath after surgery and follow voiding   Mild hypoglycemia - resolved after dextrose  fluids and diet   Oral stomatitis -- reporting mouth pain, ordered for magic mouthwash PRN, soft foods diet    Acute hypoxic respiratory failure - Pulse ox is 88 to 89%-EMS placed 2 L of oxygen on her - The patient does wear oxygen at home during nighttime - Portable chest x-ray that was performed earlier shows left basilar consolidation - Suspect she has atelectasis secondary to not being out of bed for days and this is likely superimposed on COPD     COPD-ongoing cigarette use - The patient states that she has never been told that she has emphysema or COPD and continues to smoke about half to 3/4 pack/day - She has seen [place] [facility] in the past, seen by Dr. Darlean and is maintained on Advair and albuterol  - She declines a nicotine patch -  Smoking cessation counseling done   Leukocytosis-resolved  - Acute stress response?  Recently was treated for pneumonia but overall no signs of infection at this time - UA negative - WBC 10.3   Hypomagnesemia -- IV replacement ordered  -- recheck in AM      OSA (obstructive sleep apnea) - CPAP ordered nightly   Colostomy with history of colocutaneous fistula - She is able to manage her colostomy herself   Obesity Estimated body mass index is 38.52 kg/m as calculated from the following:   Height as of this encounter: 5' 5 (1.651 m).   Weight as of this  encounter: 105 kg.   DVT prophylaxis: SQ heparin  Code Status: Full  Communication: discussed plan of care in detail with patient today who verbalized understanding Disposition: SNF pending    Consultants:  Orthopedics   Procedures:  Retrograde left femoral nail 6/20  Antimicrobials:    Subjective: Pt reports some pain but controlled with meds and willing to go to rehab.     Objective: Vitals:   06/13/24 2303 06/14/24 0428 06/14/24 0729 06/14/24 1241  BP:  126/60  (!) 108/51  Pulse: 76 73  93  Resp: 16 16  17   Temp:  98.5 F (36.9 C)  98.3 F (36.8 C)  TempSrc:  Oral    SpO2: 93% 99% 93% 96%  Weight:      Height:        Intake/Output Summary (Last 24 hours) at 06/14/2024 1557 Last data filed at 06/14/2024 1539 Gross per 24 hour  Intake 1140 ml  Output 1600 ml  Net -460 ml   Filed Weights   06/07/24 1700 06/12/24 1119  Weight: 105 kg 105 kg   Examination:  General exam: Appears calm and comfortable  Respiratory system: Clear to auscultation. Respiratory effort normal. Cardiovascular system: normal S1 & S2 heard. No JVD, murmurs, rubs, gallops or clicks. No pedal edema. Gastrointestinal system: Abdomen is nondistended, soft and nontender. No organomegaly or masses felt. Normal bowel sounds heard. Central nervous system: Alert and oriented. No focal neurological deficits. Extremities: left leg swollen in bandages, warm feet with good cap refill in left foot Skin: No rashes, lesions or ulcers. Psychiatry: Judgement and insight appear normal. Mood & affect appropriate.   Data Reviewed: I have personally reviewed following labs and imaging studies  CBC: Recent Labs  Lab 06/07/24 1710 06/08/24 0638 06/10/24 0423 06/11/24 0421 06/12/24 0426 06/13/24 0403 06/14/24 0309  WBC 13.8*   < > 10.1 10.3 14.9* 21.9* 18.1*  NEUTROABS 11.7*  --   --   --   --   --   --   HGB 10.8*   < > 9.7* 9.8* 10.4* 8.6* 7.3*  HCT 31.7*   < > 31.2* 31.7* 34.6* 28.7* 23.3*  MCV 96.6    < > 102.6* 103.9* 102.4* 102.9* 101.3*  PLT 289   < > 138* 140* 104* 64* PLATELETS APPEAR DECREASED   < > = values in this interval not displayed.    Basic Metabolic Panel: Recent Labs  Lab 06/07/24 1710 06/08/24 0517 06/09/24 0442 06/10/24 0423 06/11/24 0421 06/11/24 0451 06/12/24 0426 06/12/24 0812 06/12/24 1036 06/13/24 0403 06/14/24 0309  NA 133*   < > 138 139 137  --  137  --   --  134* 134*  K 5.3*   < > 4.8 4.9 4.9  --  5.6* 5.5* 5.1 5.5* 4.4  CL 96*   < > 107 109 108  --  109  --   --  106 104  CO2 22   < > 22 24 24   --  25  --   --  24 25  GLUCOSE 66*   < > 78 79 84  --  95  --   --  111* 82  BUN 117*   < > 80* 71* 54*  --  43*  --   --  36* 45*  CREATININE 4.50*   < > 2.73* 2.02* 1.66*  --  1.44*  --   --  1.27* 1.18*  CALCIUM  8.5*   < > 8.2* 8.3* 8.6*  --  8.9  --   --  8.3* 8.5*  MG 2.1  --  1.8 1.6*  --  2.0  --   --   --   --  1.5*   < > = values in this interval not displayed.    CBG: Recent Labs  Lab 06/13/24 1109 06/13/24 1739 06/14/24 0005 06/14/24 0528 06/14/24 1128  GLUCAP 119* 119* 98 88 88    Recent Results (from the past 240 hours)  Surgical pcr screen     Status: None   Collection Time: 06/10/24  4:00 PM   Specimen: Nasal Mucosa; Nasal Swab  Result Value Ref Range Status   MRSA, PCR NEGATIVE NEGATIVE Final   Staphylococcus aureus NEGATIVE NEGATIVE Final    Comment: (NOTE) The Xpert SA Assay (FDA approved for NASAL specimens in patients 31 years of age and older), is one component of a comprehensive surveillance program. It is not intended to diagnose infection nor to guide or monitor treatment. Performed at [facility], [address] [place], [place] [address]      Radiology Studies: DG FEMUR MIN 2 VIEWS LEFT Result Date: 06/12/2024 CLINICAL DATA:  Intramedullary nail fixation EXAM: LEFT FEMUR 2 VIEWS COMPARISON:  None Available. FINDINGS: Intramedullary nail fixation of comminuted fracture of the distal femur. Three distal  locking screws. IMPRESSION: Internal fixation of distal femur fracture Electronically Signed   By: Jackquline Boxer M.D.   On: 06/12/2024 18:25    Scheduled Meds:  allopurinol   300 mg Oral Daily   Chlorhexidine  Gluconate Cloth  6 each Topical Daily   enoxaparin  (LOVENOX ) injection  40 mg Subcutaneous Q24H   fluticasone  furoate-vilanterol  1 puff Inhalation Daily   gabapentin   300 mg Oral QHS   levothyroxine   137 mcg Oral Q0600   nystatin    Topical BID   rosuvastatin   10 mg Oral Daily   senna-docusate  2 tablet Oral QHS   Continuous Infusions:   LOS: 7 days   Time spent: 55 mins  Snow Peoples Vicci, MD How to contact the [facility] Attending or Consulting provider 7A - 7P or covering provider during after hours 7P -7A, for this patient?  Check the care team in [facility] and look for a) attending/consulting TR[facility] provider listed and b) the TRH team listed Log into [email or web address] to find provider on call.  Locate the TR[facility] provider you are looking for under [facility] and page to a number that you can be directly reached. If you still have difficulty reaching the provider, please page the [facility] (Director on Call) for the Hospitalists listed on amion for assistance.  06/14/2024, 3:57 PM

## 2024-06-14 NOTE — Progress Notes (Signed)
 Physical Therapy Treatment Patient Details Name: Christina Castaneda MRN: [id number] DOB: 26-Jul-1956 Today's Date: 06/14/2024   History of Present Illness Christina Castaneda is a 68 y.o. female with medical history of nicotine abuse, HTN, hypothyroidism who fell while trying to sit on her toilet at home in the middle of the night on Tuesday.  She was not able to get up and EMS was called.  She was transferred from the bathroom to her bed by EMS who then left her at home.  Since that date she has not been able to stand or ambulate.  She was just discharged from the hospital on June 8.  She states she had a touch of pneumonia at that time.     In the ED, she was found to have a left femoral fracture and AKI. Upon further questioning in regards to her AKI, she states that she occasionally takes aspirin and took maybe 4 doses of aspirin since her fall on Tuesday.  Has not taken any other NSAIDs.  She thinks she has been drinking enough fluid but is not sure.  She has been wearing a urine incontinence pad since her fall and she thinks she urinated earlier today prior to coming in the hospital.  No hematuria.  No flank pain.  No prior history of renal issues.    PT Comments  Patient continues to demonstrate decreased LE strength, difficulty standing and impaired balance. Patient also demonstrates decreased activity tolerance today due to nausea, vertigo symptoms, and fear of falling. Patient able to progress with supine to sitting EOB with moderate assistance today with improved performance/participation. Pt continues to demonstrate need for multiple rest breaks due to nausea throughout session. Patient would continue to benefit from skilled physical therapy for increased activity tolerance, increased LE strength, and improved balance for improved quality of life, improved independence with gait training and continued progress towards therapy goals.     If plan is discharge home, recommend the following: A lot of  help with bathing/dressing/bathroom;A lot of help with walking and/or transfers;Help with stairs or ramp for entrance;Assistance with cooking/housework;Assist for transportation   Can travel by private vehicle     No  Equipment Recommendations  None recommended by PT    Recommendations for Other Services       Precautions / Restrictions Precautions Precautions: Knee;Fall Recall of Precautions/Restrictions: Intact Required Braces or Orthoses: Knee Immobilizer - Left Restrictions Weight Bearing Restrictions Per Provider Order: Yes LLE Weight Bearing Per Provider Order: Weight bearing as tolerated     Mobility  Bed Mobility Overal bed mobility: Needs Assistance Bed Mobility: Supine to Sit     Supine to sit: Mod assist     General bed mobility comments: increased time with frequent rest breaks, verbal/tactile cueing    Transfers Overall transfer level: Needs assistance Equipment used: Rolling walker (2 wheels) Transfers: Sit to/from Stand Sit to Stand: Max assist, Mod assist           General transfer comment: pt unable to complete bed to chair transfer due to increased nausea and vertigo symptoms    Ambulation/Gait                   Stairs             Wheelchair Mobility     Tilt Bed    Modified Rankin (Stroke Patients Only)       Balance Overall balance assessment: Needs assistance Sitting-balance support: Feet supported, No upper extremity supported Sitting  balance-Leahy Scale: Good     Standing balance support: Bilateral upper extremity supported, During functional activity, Reliant on assistive device for balance Standing balance-Leahy Scale: Poor Standing balance comment: using RW, mod assit for balance                            Communication Communication Communication: No apparent difficulties  Cognition Arousal: Alert Behavior During Therapy: WFL for tasks assessed/performed   PT - Cognitive impairments: No  apparent impairments                         Following commands: Intact      Cueing Cueing Techniques: Verbal cues  Exercises      General Comments        Pertinent Vitals/Pain Pain Assessment Pain Assessment: No/denies pain    Home Living                          Prior Function            PT Goals (current goals can now be found in the care plan section) Acute Rehab PT Goals Patient Stated Goal: to get stronger before returning home PT Goal Formulation: With patient/family Time For Goal Achievement: 06/27/24 Potential to Achieve Goals: Good Progress towards PT goals: Progressing toward goals    Frequency    Min 3X/week      PT Plan      Co-evaluation              AM-PAC PT 6 Clicks Mobility   Outcome Measure  Help needed turning from your back to your side while in a flat bed without using bedrails?: A Lot Help needed moving from lying on your back to sitting on the side of a flat bed without using bedrails?: A Lot Help needed moving to and from a bed to a chair (including a wheelchair)?: Total Help needed standing up from a chair using your arms (e.g., wheelchair or bedside chair)?: Total Help needed to walk in hospital room?: Total Help needed climbing 3-5 steps with a railing? : Total 6 Click Score: 8    End of Session   Activity Tolerance: Patient limited by fatigue;Patient limited by pain Patient left: in bed (nurse tech to change bed)   PT Visit Diagnosis: Other abnormalities of gait and mobility (R26.89);History of falling (Z91.81);Muscle weakness (generalized) (M62.81);Pain Pain - Right/Left: Left Pain - part of body: Hip;Knee;Leg     Time: 1515-1540 PT Time Calculation (min) (ACUTE ONLY): 25 min  Charges:    $Therapeutic Activity: 8-22 mins PT General Charges $$ ACUTE PT VISIT: 1 Visit                     Lang Ada, PT, DPT [facility] Office: [phone] 3:53 PM,  06/14/24

## 2024-06-14 NOTE — Progress Notes (Signed)
 Patient removed from CPAP due to nausea.

## 2024-06-15 ENCOUNTER — Inpatient Hospital Stay (HOSPITAL_COMMUNITY)

## 2024-06-15 ENCOUNTER — Encounter (HOSPITAL_COMMUNITY)
Admission: EM | Disposition: A | Discharge status: Skilled Nursing Facility | Payer: Self-pay | Source: Home / Self Care | Attending: Internal Medicine

## 2024-06-15 ENCOUNTER — Inpatient Hospital Stay (HOSPITAL_COMMUNITY): Admitting: Certified Registered Nurse Anesthetist

## 2024-06-15 ENCOUNTER — Encounter (HOSPITAL_COMMUNITY): Payer: Self-pay | Admitting: Internal Medicine

## 2024-06-15 DIAGNOSIS — K269 Duodenal ulcer, unspecified as acute or chronic, without hemorrhage or perforation: Secondary | ICD-10-CM | POA: Diagnosis present

## 2024-06-15 DIAGNOSIS — R6521 Severe sepsis with septic shock: Secondary | ICD-10-CM | POA: Diagnosis not present

## 2024-06-15 DIAGNOSIS — I10 Essential (primary) hypertension: Secondary | ICD-10-CM

## 2024-06-15 DIAGNOSIS — J449 Chronic obstructive pulmonary disease, unspecified: Secondary | ICD-10-CM

## 2024-06-15 DIAGNOSIS — K660 Peritoneal adhesions (postprocedural) (postinfection): Secondary | ICD-10-CM | POA: Diagnosis not present

## 2024-06-15 DIAGNOSIS — F17210 Nicotine dependence, cigarettes, uncomplicated: Secondary | ICD-10-CM

## 2024-06-15 DIAGNOSIS — K265 Chronic or unspecified duodenal ulcer with perforation: Secondary | ICD-10-CM

## 2024-06-15 DIAGNOSIS — D72829 Elevated white blood cell count, unspecified: Secondary | ICD-10-CM | POA: Diagnosis present

## 2024-06-15 DIAGNOSIS — K276 Chronic or unspecified peptic ulcer, site unspecified, with both hemorrhage and perforation: Secondary | ICD-10-CM | POA: Diagnosis not present

## 2024-06-15 DIAGNOSIS — D696 Thrombocytopenia, unspecified: Secondary | ICD-10-CM | POA: Diagnosis present

## 2024-06-15 DIAGNOSIS — D62 Acute posthemorrhagic anemia: Secondary | ICD-10-CM | POA: Diagnosis not present

## 2024-06-15 DIAGNOSIS — R578 Other shock: Secondary | ICD-10-CM | POA: Diagnosis not present

## 2024-06-15 DIAGNOSIS — A419 Sepsis, unspecified organism: Secondary | ICD-10-CM | POA: Diagnosis not present

## 2024-06-15 DIAGNOSIS — J9621 Acute and chronic respiratory failure with hypoxia: Secondary | ICD-10-CM

## 2024-06-15 HISTORY — PX: LYSIS OF ADHESION: SHX5961

## 2024-06-15 HISTORY — PX: LAPAROTOMY: SHX154

## 2024-06-15 LAB — CBC
HCT: 28.9 % — ABNORMAL LOW (ref 36.0–46.0)
HCT: 25.9 % — ABNORMAL LOW (ref 36.0–46.0)
Hemoglobin: 9.9 g/dL — ABNORMAL LOW (ref 12.0–15.0)
Hemoglobin: 8.7 g/dL — ABNORMAL LOW (ref 12.0–15.0)
MCH: 33 pg (ref 26.0–34.0)
MCH: 32 pg (ref 26.0–34.0)
MCHC: 34.3 g/dL (ref 30.0–36.0)
MCHC: 33.6 g/dL (ref 30.0–36.0)
MCV: 96.3 fL (ref 80.0–100.0)
MCV: 95.2 fL (ref 80.0–100.0)
Platelets: 149 10*3/uL — ABNORMAL LOW (ref 150–400)
Platelets: 166 10*3/uL (ref 150–400)
RBC: 3 MIL/uL — ABNORMAL LOW (ref 3.87–5.11)
RBC: 2.72 MIL/uL — ABNORMAL LOW (ref 3.87–5.11)
RDW: 16.1 % — ABNORMAL HIGH (ref 11.5–15.5)
RDW: 16 % — ABNORMAL HIGH (ref 11.5–15.5)
WBC: 23.2 10*3/uL — ABNORMAL HIGH (ref 4.0–10.5)
WBC: 27.6 10*3/uL — ABNORMAL HIGH (ref 4.0–10.5)
nRBC: 1.2 % — ABNORMAL HIGH (ref 0.0–0.2)
nRBC: 0.7 % — ABNORMAL HIGH (ref 0.0–0.2)

## 2024-06-15 LAB — COMPREHENSIVE METABOLIC PANEL WITH GFR
ALT: 41 U/L (ref 0–44)
AST: 93 U/L — ABNORMAL HIGH (ref 15–41)
Albumin: <1.5 g/dL — ABNORMAL LOW (ref 3.5–5.0)
Alkaline Phosphatase: 39 U/L (ref 38–126)
Anion gap: 7 (ref 5–15)
BUN: 84 mg/dL — ABNORMAL HIGH (ref 8–23)
CO2: 22 mmol/L (ref 22–32)
Calcium: 7.9 mg/dL — ABNORMAL LOW (ref 8.9–10.3)
Chloride: 106 mmol/L (ref 98–111)
Creatinine, Ser: 1.54 mg/dL — ABNORMAL HIGH (ref 0.44–1.00)
GFR, Estimated: 36 mL/min — ABNORMAL LOW (ref >60)
Glucose, Bld: 129 mg/dL — ABNORMAL HIGH (ref 70–99)
Potassium: 4.6 mmol/L (ref 3.5–5.1)
Sodium: 135 mmol/L (ref 135–145)
Total Bilirubin: 0.9 mg/dL (ref 0.0–1.2)
Total Protein: 3.8 g/dL — ABNORMAL LOW (ref 6.5–8.1)

## 2024-06-15 LAB — BASIC METABOLIC PANEL WITH GFR
Anion gap: 13 (ref 5–15)
BUN: 72 mg/dL — ABNORMAL HIGH (ref 8–23)
CO2: 21 mmol/L — ABNORMAL LOW (ref 22–32)
Calcium: 8.9 mg/dL (ref 8.9–10.3)
Chloride: 102 mmol/L (ref 98–111)
Creatinine, Ser: 1.29 mg/dL — ABNORMAL HIGH (ref 0.44–1.00)
GFR, Estimated: 45 mL/min — ABNORMAL LOW (ref >60)
Glucose, Bld: 112 mg/dL — ABNORMAL HIGH (ref 70–99)
Potassium: 4.7 mmol/L (ref 3.5–5.1)
Sodium: 136 mmol/L (ref 135–145)

## 2024-06-15 LAB — POCT I-STAT 7, (LYTES, BLD GAS, ICA,H+H)
Acid-base deficit: 3 mmol/L — ABNORMAL HIGH (ref 0.0–2.0)
Bicarbonate: 22 mmol/L (ref 20.0–28.0)
Calcium, Ion: 1.22 mmol/L (ref 1.15–1.40)
HCT: 23 % — ABNORMAL LOW (ref 36.0–46.0)
Hemoglobin: 7.8 g/dL — ABNORMAL LOW (ref 12.0–15.0)
O2 Saturation: 99 %
Patient temperature: 97
Potassium: 4.6 mmol/L (ref 3.5–5.1)
Sodium: 136 mmol/L (ref 135–145)
TCO2: 23 mmol/L (ref 22–32)
pCO2 arterial: 38.2 mmHg (ref 32–48)
pH, Arterial: 7.365 (ref 7.35–7.45)
pO2, Arterial: 134 mmHg — ABNORMAL HIGH (ref 83–108)

## 2024-06-15 LAB — MAGNESIUM
Magnesium: 2.2 mg/dL (ref 1.7–2.4)
Magnesium: 1.8 mg/dL (ref 1.7–2.4)

## 2024-06-15 LAB — BLOOD GAS, ARTERIAL
Acid-base deficit: 2.8 mmol/L — ABNORMAL HIGH (ref 0.0–2.0)
Bicarbonate: 23.2 mmol/L (ref 20.0–28.0)
Drawn by: 27016
O2 Saturation: 99.2 %
Patient temperature: 37
pCO2 arterial: 44 mmHg (ref 32–48)
pH, Arterial: 7.33 — ABNORMAL LOW (ref 7.35–7.45)
pO2, Arterial: 122 mmHg — ABNORMAL HIGH (ref 83–108)

## 2024-06-15 LAB — PROTIME-INR
INR: 1.7 — ABNORMAL HIGH (ref 0.8–1.2)
Prothrombin Time: 19.9 s — ABNORMAL HIGH (ref 11.4–15.2)

## 2024-06-15 LAB — PREPARE RBC (CROSSMATCH)

## 2024-06-15 LAB — MRSA NEXT GEN BY PCR, NASAL: MRSA by PCR Next Gen: NOT DETECTED

## 2024-06-15 LAB — APTT: aPTT: 29 s (ref 24–36)

## 2024-06-15 LAB — GLUCOSE, CAPILLARY
Glucose-Capillary: 122 mg/dL — ABNORMAL HIGH (ref 70–99)
Glucose-Capillary: 103 mg/dL — ABNORMAL HIGH (ref 70–99)

## 2024-06-15 SURGERY — LAPAROTOMY, EXPLORATORY
Anesthesia: General | Site: Abdomen

## 2024-06-15 SURGERY — EGD (ESOPHAGOGASTRODUODENOSCOPY)
Anesthesia: Choice

## 2024-06-15 MED ORDER — ARFORMOTEROL TARTRATE 15 MCG/2ML IN NEBU
15.0000 ug | INHALATION_SOLUTION | Freq: Two times a day (BID) | RESPIRATORY_TRACT | Status: DC
  Administered 2024-06-15 – 2024-07-05 (×38): 15 ug via RESPIRATORY_TRACT
  Filled 2024-06-15 (×38): qty 2

## 2024-06-15 MED ORDER — LIDOCAINE 2% (20 MG/ML) 5 ML SYRINGE
INTRAMUSCULAR | Status: AC
Start: 2024-06-15 — End: 2024-06-15
  Filled 2024-06-15: qty 5

## 2024-06-15 MED ORDER — SODIUM CHLORIDE 0.9 % IV BOLUS: 500.0000 mL | Freq: Once | INTRAVENOUS | Status: DC

## 2024-06-15 MED ORDER — CHLORHEXIDINE GLUCONATE 0.12 % MT SOLN
15.0000 mL | Freq: Once | OROMUCOSAL | Status: AC
  Administered 2024-06-15: 15 mL via OROMUCOSAL

## 2024-06-15 MED ORDER — VANCOMYCIN HCL IN DEXTROSE 1-5 GM/200ML-% IV SOLN
INTRAVENOUS | Status: AC
  Filled 2024-06-15: qty 200

## 2024-06-15 MED ORDER — MIDAZOLAM HCL 2 MG/2ML IJ SOLN
INTRAMUSCULAR | Status: AC
  Filled 2024-06-15: qty 2

## 2024-06-15 MED ORDER — CIPROFLOXACIN IN D5W 400 MG/200ML IV SOLN
INTRAVENOUS | Status: AC
  Filled 2024-06-15: qty 200

## 2024-06-15 MED ORDER — ORAL CARE MOUTH RINSE: 15.0000 mL | Freq: Once | OROMUCOSAL | Status: AC

## 2024-06-15 MED ORDER — VANCOMYCIN HCL 2000 MG/400ML IV SOLN
2000.0000 mg | Freq: Once | INTRAVENOUS | Status: DC
  Filled 2024-06-15: qty 400

## 2024-06-15 MED ORDER — NOREPINEPHRINE 4 MG/250ML-% IV SOLN
INTRAVENOUS | Status: AC
  Administered 2024-06-15: 10 ug/min via INTRAVENOUS
  Filled 2024-06-15: qty 250

## 2024-06-15 MED ORDER — IOHEXOL 300 MG/ML  SOLN
100.0000 mL | Freq: Once | INTRAMUSCULAR | Status: AC | PRN
  Administered 2024-06-15: 100 mL via INTRAVENOUS

## 2024-06-15 MED ORDER — SODIUM CHLORIDE 0.9 % IV BOLUS
1000.0000 mL | Freq: Once | INTRAVENOUS | Status: AC
  Administered 2024-06-15: 1000 mL via INTRAVENOUS

## 2024-06-15 MED ORDER — FENTANYL BOLUS VIA INFUSION: 25.0000 ug | INTRAVENOUS | Status: DC | PRN

## 2024-06-15 MED ORDER — HYDROMORPHONE HCL 1 MG/ML IJ SOLN: 0.5000 mg | INTRAMUSCULAR | Status: DC | PRN

## 2024-06-15 MED ORDER — HALOPERIDOL LACTATE 5 MG/ML IJ SOLN: 2.5000 mg | Freq: Four times a day (QID) | INTRAMUSCULAR | Status: DC | PRN

## 2024-06-15 MED ORDER — PHENYLEPHRINE 80 MCG/ML (10ML) SYRINGE FOR IV PUSH (FOR BLOOD PRESSURE SUPPORT)
PREFILLED_SYRINGE | INTRAVENOUS | Status: DC | PRN
  Administered 2024-06-15: 80 ug via INTRAVENOUS
  Administered 2024-06-15 (×2): 160 ug via INTRAVENOUS
  Administered 2024-06-15 (×3): 80 ug via INTRAVENOUS

## 2024-06-15 MED ORDER — VASOPRESSIN 20 UNITS/100 ML INFUSION FOR SHOCK
0.0000 [IU]/min | INTRAVENOUS | Status: DC
  Administered 2024-06-15 – 2024-06-16 (×3): 0.03 [IU]/min via INTRAVENOUS
  Filled 2024-06-15 (×2): qty 100

## 2024-06-15 MED ORDER — IPRATROPIUM-ALBUTEROL 0.5-2.5 (3) MG/3ML IN SOLN
3.0000 mL | RESPIRATORY_TRACT | Status: DC | PRN
  Administered 2024-06-21 – 2024-07-05 (×9): 3 mL via RESPIRATORY_TRACT
  Filled 2024-06-15 (×8): qty 3

## 2024-06-15 MED ORDER — POLYETHYLENE GLYCOL 3350 17 G PO PACK
17.0000 g | PACK | Freq: Every day | ORAL | Status: DC
  Filled 2024-06-15: qty 1

## 2024-06-15 MED ORDER — SODIUM CHLORIDE 0.9 % IR SOLN
Status: DC | PRN
Start: 2024-06-15 — End: 2024-06-15
  Administered 2024-06-15 (×2): 1000 mL

## 2024-06-15 MED ORDER — SODIUM CHLORIDE 0.9 % IV SOLN
250.0000 mL | INTRAVENOUS | Status: AC
  Administered 2024-06-15: 250 mL via INTRAVENOUS

## 2024-06-15 MED ORDER — PROPOFOL 1000 MG/100ML IV EMUL
INTRAVENOUS | Status: AC
  Administered 2024-06-15: 5 ug/kg/min via INTRAVENOUS
  Filled 2024-06-15: qty 100

## 2024-06-15 MED ORDER — PANTOPRAZOLE SODIUM 40 MG IV SOLR
40.0000 mg | Freq: Two times a day (BID) | INTRAVENOUS | Status: DC
  Administered 2024-06-15 – 2024-06-22 (×15): 40 mg via INTRAVENOUS
  Filled 2024-06-15 (×15): qty 10

## 2024-06-15 MED ORDER — MIDAZOLAM HCL 2 MG/2ML IJ SOLN
INTRAMUSCULAR | Status: DC | PRN
  Administered 2024-06-15 (×2): 2 mg via INTRAVENOUS

## 2024-06-15 MED ORDER — SODIUM CHLORIDE 0.9 % IV SOLN: 10.0000 mL/h | Freq: Once | INTRAVENOUS | Status: AC

## 2024-06-15 MED ORDER — SODIUM CHLORIDE 0.9% IV SOLUTION: Freq: Once | INTRAVENOUS | Status: AC

## 2024-06-15 MED ORDER — SODIUM CHLORIDE 0.9% IV SOLUTION: Freq: Once | INTRAVENOUS | Status: DC

## 2024-06-15 MED ORDER — PROPOFOL 500 MG/50ML IV EMUL
INTRAVENOUS | Status: DC | PRN
  Administered 2024-06-15: 40 mg via INTRAVENOUS
  Administered 2024-06-15: 100 mg via INTRAVENOUS

## 2024-06-15 MED ORDER — VANCOMYCIN HCL IN DEXTROSE 1-5 GM/200ML-% IV SOLN: 1000.0000 mg | INTRAVENOUS | Status: DC

## 2024-06-15 MED ORDER — PROPOFOL 1000 MG/100ML IV EMUL
0.0000 ug/kg/min | INTRAVENOUS | Status: DC
  Administered 2024-06-15: 15 ug/kg/min via INTRAVENOUS
  Filled 2024-06-15: qty 100

## 2024-06-15 MED ORDER — CIPROFLOXACIN IN D5W 400 MG/200ML IV SOLN: 400.0000 mg | Freq: Two times a day (BID) | INTRAVENOUS | Status: DC

## 2024-06-15 MED ORDER — NOREPINEPHRINE 4 MG/250ML-% IV SOLN
0.0000 ug/min | INTRAVENOUS | Status: DC
  Administered 2024-06-15: 2 ug/min via INTRAVENOUS
  Administered 2024-06-15: 26 ug/min via INTRAVENOUS
  Administered 2024-06-15: 15 ug/min via INTRAVENOUS
  Administered 2024-06-16: 8 ug/min via INTRAVENOUS
  Administered 2024-06-16: 5 ug/min via INTRAVENOUS
  Filled 2024-06-15 (×5): qty 250

## 2024-06-15 MED ORDER — MIDAZOLAM HCL 2 MG/2ML IJ SOLN
INTRAMUSCULAR | Status: AC
Start: 2024-06-15 — End: 2024-06-15
  Filled 2024-06-15: qty 2

## 2024-06-15 MED ORDER — ROCURONIUM BROMIDE 10 MG/ML (PF) SYRINGE
PREFILLED_SYRINGE | INTRAVENOUS | Status: DC | PRN
  Administered 2024-06-15: 50 mg via INTRAVENOUS

## 2024-06-15 MED ORDER — PIPERACILLIN-TAZOBACTAM 3.375 G IVPB
3.3750 g | Freq: Three times a day (TID) | INTRAVENOUS | Status: AC
  Administered 2024-06-15 – 2024-06-20 (×17): 3.375 g via INTRAVENOUS
  Filled 2024-06-15 (×21): qty 50

## 2024-06-15 MED ORDER — NOREPINEPHRINE 16 MG/250ML-% IV SOLN
0.0000 ug/min | INTRAVENOUS | Status: DC
  Filled 2024-06-15: qty 250

## 2024-06-15 MED ORDER — PHENYLEPHRINE 80 MCG/ML (10ML) SYRINGE FOR IV PUSH (FOR BLOOD PRESSURE SUPPORT)
PREFILLED_SYRINGE | INTRAVENOUS | Status: AC
  Filled 2024-06-15: qty 20

## 2024-06-15 MED ORDER — FENTANYL 2500MCG IN NS 250ML (10MCG/ML) PREMIX INFUSION
0.0000 ug/h | INTRAVENOUS | Status: DC
  Administered 2024-06-15: 50 ug/h via INTRAVENOUS
  Filled 2024-06-15: qty 250

## 2024-06-15 MED ORDER — MEDIHONEY WOUND/BURN DRESSING EX PSTE
1.0000 | PASTE | Freq: Every day | CUTANEOUS | Status: DC
  Administered 2024-06-16 – 2024-06-23 (×7): 1 via TOPICAL
  Filled 2024-06-15 (×2): qty 44

## 2024-06-15 MED ORDER — FENTANYL CITRATE (PF) 250 MCG/5ML IJ SOLN
INTRAMUSCULAR | Status: DC | PRN
  Administered 2024-06-15 (×5): 50 ug via INTRAVENOUS

## 2024-06-15 MED ORDER — LIDOCAINE 2% (20 MG/ML) 5 ML SYRINGE
INTRAMUSCULAR | Status: DC | PRN
  Administered 2024-06-15: 100 mg via INTRAVENOUS

## 2024-06-15 MED ORDER — SODIUM CHLORIDE 0.9 % IV SOLN: 250.0000 mL | INTRAVENOUS | Status: AC | PRN

## 2024-06-15 MED ORDER — FENTANYL CITRATE PF 50 MCG/ML IJ SOSY
25.0000 ug | PREFILLED_SYRINGE | Freq: Once | INTRAMUSCULAR | Status: DC
  Filled 2024-06-15: qty 1

## 2024-06-15 MED ORDER — BUPIVACAINE HCL (PF) 0.5 % IJ SOLN
INTRAMUSCULAR | Status: AC
  Filled 2024-06-15: qty 30

## 2024-06-15 MED ORDER — ORAL CARE MOUTH RINSE
15.0000 mL | OROMUCOSAL | Status: DC
  Administered 2024-06-15 – 2024-06-16 (×7): 15 mL via OROMUCOSAL

## 2024-06-15 MED ORDER — SODIUM CHLORIDE 0.9 % IV SOLN
INTRAVENOUS | Status: DC | PRN
Start: 2024-06-15 — End: 2024-06-15

## 2024-06-15 MED ORDER — METRONIDAZOLE 500 MG/100ML IV SOLN
INTRAVENOUS | Status: AC
  Filled 2024-06-15: qty 100

## 2024-06-15 MED ORDER — SUCCINYLCHOLINE CHLORIDE 200 MG/10ML IV SOSY
PREFILLED_SYRINGE | INTRAVENOUS | Status: DC | PRN
  Administered 2024-06-15: 140 mg via INTRAVENOUS

## 2024-06-15 MED ORDER — ORAL CARE MOUTH RINSE
15.0000 mL | OROMUCOSAL | Status: DC | PRN
Start: 2024-06-15 — End: 2024-06-16

## 2024-06-15 MED ORDER — SODIUM CHLORIDE 0.9% IV SOLUTION
Freq: Once | INTRAVENOUS | Status: DC
Start: 2024-06-15 — End: 2024-06-20

## 2024-06-15 MED ORDER — PROCHLORPERAZINE EDISYLATE 10 MG/2ML IJ SOLN
10.0000 mg | Freq: Four times a day (QID) | INTRAMUSCULAR | Status: DC | PRN
  Administered 2024-06-15 – 2024-07-05 (×7): 10 mg via INTRAVENOUS
  Filled 2024-06-15 (×7): qty 2

## 2024-06-15 MED ORDER — PROPOFOL 10 MG/ML IV BOLUS
INTRAVENOUS | Status: AC
  Filled 2024-06-15: qty 20

## 2024-06-15 MED ORDER — SODIUM CHLORIDE 0.9% FLUSH
3.0000 mL | Freq: Two times a day (BID) | INTRAVENOUS | Status: DC
  Administered 2024-06-15 – 2024-07-04 (×35): 3 mL via INTRAVENOUS

## 2024-06-15 MED ORDER — LACTATED RINGERS IV SOLN: INTRAVENOUS | Status: DC

## 2024-06-15 MED ORDER — SODIUM CHLORIDE 0.9% FLUSH: 3.0000 mL | INTRAVENOUS | Status: DC | PRN

## 2024-06-15 MED ORDER — METRONIDAZOLE 500 MG/100ML IV SOLN: 500.0000 mg | Freq: Two times a day (BID) | INTRAVENOUS | Status: DC

## 2024-06-15 MED ORDER — FLUCONAZOLE IN SODIUM CHLORIDE 400-0.9 MG/200ML-% IV SOLN
400.0000 mg | INTRAVENOUS | Status: DC
  Administered 2024-06-15: 400 mg via INTRAVENOUS
  Filled 2024-06-15: qty 200

## 2024-06-15 MED ORDER — FENTANYL CITRATE (PF) 250 MCG/5ML IJ SOLN
INTRAMUSCULAR | Status: AC
  Filled 2024-06-15: qty 5

## 2024-06-15 MED ORDER — BUPIVACAINE HCL (PF) 0.5 % IJ SOLN
INTRAMUSCULAR | Status: DC | PRN
  Administered 2024-06-15: 30 mL

## 2024-06-15 MED ORDER — BUDESONIDE 0.5 MG/2ML IN SUSP
0.5000 mg | Freq: Two times a day (BID) | RESPIRATORY_TRACT | Status: DC
  Administered 2024-06-15 – 2024-07-11 (×48): 0.5 mg via RESPIRATORY_TRACT
  Filled 2024-06-15 (×50): qty 2

## 2024-06-15 MED ORDER — NOREPINEPHRINE 4 MG/250ML-% IV SOLN: 0.0000 ug/min | INTRAVENOUS | Status: DC

## 2024-06-15 MED ORDER — PROPOFOL 500 MG/50ML IV EMUL
INTRAVENOUS | Status: AC
Start: 2024-06-15 — End: 2024-06-15
  Filled 2024-06-15: qty 50

## 2024-06-15 MED ORDER — DOCUSATE SODIUM 50 MG/5ML PO LIQD
100.0000 mg | Freq: Two times a day (BID) | ORAL | Status: DC
  Filled 2024-06-15: qty 10

## 2024-06-15 MED ORDER — VANCOMYCIN HCL IN DEXTROSE 1-5 GM/200ML-% IV SOLN
1000.0000 mg | INTRAVENOUS | Status: DC
  Administered 2024-06-15: 1000 mg via INTRAVENOUS

## 2024-06-15 SURGICAL SUPPLY — 45 items
CHLORAPREP W/TINT 26 (MISCELLANEOUS) ×2 IMPLANT
CLOTH BEACON ORANGE TIMEOUT ST (SAFETY) ×2 IMPLANT
COVER LIGHT HANDLE (MISCELLANEOUS) IMPLANT
COVER LIGHT HANDLE STERIS (MISCELLANEOUS) ×4 IMPLANT
DRAPE INCISE IOBAN 66X45 STRL (DRAPES) IMPLANT
DRAPE SURG IRRIG POUCH 19X23 (DRAPES) IMPLANT
DRAPE U-SHAPE 47X51 STRL (DRAPES) IMPLANT
DRAPE UTILITY W/TAPE 26X15 (DRAPES) IMPLANT
DRAPE WARM FLUID 44X44 (DRAPES) ×2 IMPLANT
DRSG OPSITE POSTOP 4X8 (GAUZE/BANDAGES/DRESSINGS) IMPLANT
ELECTRODE REM PT RTRN 9FT ADLT (ELECTROSURGICAL) ×2 IMPLANT
EVACUATOR DRAINAGE 10X20 100CC (DRAIN) IMPLANT
GAUZE SPONGE 4X4 12PLY STRL (GAUZE/BANDAGES/DRESSINGS) IMPLANT
GLOVE BIO SURGEON STRL SZ 6.5 (GLOVE) IMPLANT
GLOVE BIO SURGEON STRL SZ7 (GLOVE) IMPLANT
GLOVE BIOGEL PI IND STRL 6.5 (GLOVE) ×2 IMPLANT
GLOVE BIOGEL PI IND STRL 7.0 (GLOVE) ×4 IMPLANT
GLOVE SURG SS PI 6.5 STRL IVOR (GLOVE) ×4 IMPLANT
GOWN STRL REUS W/TWL LRG LVL3 (GOWN DISPOSABLE) ×6 IMPLANT
HANDLE SUCTION POOLE (INSTRUMENTS) ×2 IMPLANT
INST SET MAJOR GENERAL (KITS) ×2 IMPLANT
KIT TURNOVER KIT A (KITS) ×2 IMPLANT
LIGASURE IMPACT 36 18CM CVD LR (INSTRUMENTS) IMPLANT
MANIFOLD NEPTUNE II (INSTRUMENTS) ×2 IMPLANT
NDL HYPO 18GX1.5 BLUNT FILL (NEEDLE) ×2 IMPLANT
NDL HYPO 21X1.5 SAFETY (NEEDLE) ×2 IMPLANT
NEEDLE HYPO 18GX1.5 BLUNT FILL (NEEDLE) ×1 IMPLANT
NEEDLE HYPO 21X1.5 SAFETY (NEEDLE) ×1 IMPLANT
NS IRRIG 1000ML POUR BTL (IV SOLUTION) ×4 IMPLANT
PACK MAJOR ABDOMINAL (CUSTOM PROCEDURE TRAY) ×2 IMPLANT
PAD ARMBOARD POSITIONER FOAM (MISCELLANEOUS) ×2 IMPLANT
PENCIL SMOKE EVACUATOR COATED (MISCELLANEOUS) ×2 IMPLANT
POSITIONER HEAD 8X9X4 ADT (SOFTGOODS) ×2 IMPLANT
POUCH OSTOMY 2 PC DRNBL 2.75 (WOUND CARE) IMPLANT
SET BASIN LINEN APH (SET/KITS/TRAYS/PACK) ×2 IMPLANT
SPONGE DRAIN TRACH 4X4 STRL 2S (GAUZE/BANDAGES/DRESSINGS) IMPLANT
SPONGE T-LAP 18X18 ~~LOC~~+RFID (SPONGE) ×2 IMPLANT
STAPLER VISISTAT (STAPLE) ×2 IMPLANT
SUT ETHILON 3 0 PS 1 (SUTURE) IMPLANT
SUT PDS AB CT VIOLET #0 27IN (SUTURE) ×4 IMPLANT
SUT SILK 3 0 SH CR/8 (SUTURE) ×2 IMPLANT
SWAB CULTURE ESWAB REG 1ML (MISCELLANEOUS) IMPLANT
SYR 30ML LL (SYRINGE) ×4 IMPLANT
TAPE CLOTH SOFT 2X10 (GAUZE/BANDAGES/DRESSINGS) IMPLANT
TRAY FOLEY MTR SLVR 16FR STAT (SET/KITS/TRAYS/PACK) ×2 IMPLANT

## 2024-06-15 NOTE — Consult Note (Addendum)
 Gastroenterology Consult   Referring Provider: Dr. Vicci Primary Care Physician:  [name], DO Primary Gastroenterologist:  Dr. Shaaron, previously unassigned  Patient ID: Christina Castaneda; [id number]; 09-Mar-1956   Admit date: 06/07/2024  LOS: 8 days   Date of Consultation: 06/15/2024  Reason for Consultation:  Acute GI bleed  History of Present Illness   Christina Castaneda is a 68 y.o. year old female with a history of asthma, COPD, HTN, hypothyroidism, perforated diverticulitis s/p sigmoid colectomy with colostomy in 2017 at outside hospital and complicated by fistula with stomal necrosis and peritonitis requiring prolonged hospitalization in [place] s/p ex laparotomy with debridement, colostomy removal with partial colectomy, omentectomy, left colectomy and end colostomy (April 2017), multiple abdominal procedures with wound VAC to assist with wound closure, status post fall over  a week ago and presented to ED with left distal femur fracture, AKI, underwent fixation with left femoral nail by ortho on 6/20. She was on heparin  prophylaxis prior to procedure due to high risk for DVT, and she was placed on Lovenox  40 mg postoperatively. GI now consulted due to acute onset of melena from ostomy bag, hypovolemic shock, concerning for rapid transit UGI bleed.   Hgb was in 11 range pre-operatively, 9 range post op, trending down to 8.6, then 7.3 yesterday, 6.2 overnight, worsening thrombocytopenia from 140 to 64 yesterday. No chronic history of thrombocytopenia. No known hx of cirrhosis. CT in 2017 with hepatic steatosis. Developed melena from ostomy bag overnight, reported severe abdominal pain overnight, went into profound shock and was started on levophed  and vasopressin. Central line was placed at the bedside per ED physician. CTA had been ordered and now pending.   Last dose of Lovenox  yesterday afternoon. She was started on Levo and vasopressin. At time of consultation, had already received  1 unit platelets, 1 unit PRBCs overnight, 2 additional units PRBCs ordered and about to be hung.    At bedside: currently titrating levophed , with dose at 26 mcg/min during consultation. SBP 120s, HR in the 80s. Patient alert and oriented to person, place. She is unable to recall much of the events from overnight but does state she had developed severe left-sided abdominal pain the night prior to surgery and experienced this again last night. Abdominal pain much improved and now with just slight left sided twinges. She denies any N/V. Denies any prior history of abdominal pain. Denies any outpatient NSAIDs or anticoagulation prior to admission. Denies any hx PUD or liver disease. No hx of chronic GERD, dysphagia. No prior hx of GI bleed. No prior EGD. She believes she had a remote colonoscopy but unable to recall when. No hx of lack of appetite. Has been trying to work towards rehab.   Husband is not at bedside, as she told him to go to work.     Past Medical History:  Diagnosis Date   Asthma    mild   Atypical endometrial hyperplasia    COPD (chronic obstructive pulmonary disease) (HCC)    Goiter    Gout    Heart murmur    Hypertension    Hypothyroidism    Vitamin B12 deficiency    Yeast infection    for last 3 weeks    Past Surgical History:  Procedure Laterality Date   ABDOMINAL WALL DEFECT REPAIR N/A 04/18/2016   Procedure: CLOSURE OF ABDOMEN;  Surgeon: Debby Shipper, MD;  Location: [facility] OR;  Service: General;  Laterality: N/A;   APPENDECTOMY     APPLICATION  OF WOUND VAC N/A 04/02/2016   Procedure: application of wound vac+;  Surgeon: Camellia Blush, MD;  Location: [facility] OR;  Service: General;  Laterality: N/A;   CHOLECYSTECTOMY  2009   gallstone removed   COLOSTOMY N/A 03/30/2016   Procedure: COLOSTOMY;  Surgeon: Donnice Bury, MD;  Location: [facility] OR;  Service: General;  Laterality: N/A;   COLOSTOMY REVISION N/A 03/28/2016   Procedure: COLOSTOMY REVISION;  Surgeon: Lynwood Pina, MD;   Location: [facility] OR;  Service: General;  Laterality: N/A;   COLOSTOMY REVISION N/A 03/30/2016   Procedure: COLON RESECTION LEFT;  Surgeon: Donnice Bury, MD;  Location: [facility] OR;  Service: General;  Laterality: N/A;   LAPAROTOMY N/A 03/28/2016   Procedure: EXPLORATORY LAPAROTOMY;  Surgeon: Lynwood Pina, MD;  Location: [facility] OR;  Service: General;  Laterality: N/A;   LAPAROTOMY N/A 04/02/2016   Procedure: Re-exploration of open abdomen, application of abdominal wound vac;  Surgeon: Camellia Blush, MD;  Location: [facility] OR;  Service: General;  Laterality: N/A;   LAPAROTOMY N/A 04/04/2016   Procedure: EXPLORATORY LAPAROTOMY, PLACEMENT OF ABRA ABDOMINAL WALL CLOSURE SET ;  Surgeon: Camellia Blush, MD;  Location: [facility] OR;  Service: General;  Laterality: N/A;   LAPAROTOMY N/A 04/18/2016   Procedure: EXPLORATORY LAPAROTOMY;  Surgeon: Debby Shipper, MD;  Location: [facility] OR;  Service: General;  Laterality: N/A;   NECK SURGERY  1994   repair disk   OMENTECTOMY N/A 03/28/2016   Procedure: OMENTECTOMY;  Surgeon: Lynwood Pina, MD;  Location: [facility] OR;  Service: General;  Laterality: N/A;   ROBOTIC ASSISTED TOTAL HYSTERECTOMY WITH BILATERAL SALPINGO OOPHERECTOMY  11/18/2012   Procedure: ROBOTIC ASSISTED TOTAL HYSTERECTOMY WITH BILATERAL SALPINGO OOPHORECTOMY;  Surgeon: Elenore A. Dodie, MD;  Location: [facility] ORS;  Service: Gynecology;  Laterality: N/A;   TONSILLECTOMY     as a child   VACUUM ASSISTED CLOSURE CHANGE N/A 03/30/2016   Procedure: ABDOMINAL VAC CHANGE;  Surgeon: Donnice Bury, MD;  Location: [facility] OR;  Service: General;  Laterality: N/A;   WOUND DEBRIDEMENT N/A 03/28/2016   Procedure: DEBRIDEMENT WOUND;  Surgeon: Lynwood Pina, MD;  Location: [facility] OR;  Service: General;  Laterality: N/A;    Prior to Admission medications   Medication Sig Start Date End Date Taking? Authorizing Provider  acetaminophen  (TYLENOL ) 500 MG tablet Take 1,000 mg by mouth every 6 (six) hours.   Yes [provider]  albuterol  (VENTOLIN  HFA) 108 (90  Base) MCG/ACT inhaler Inhale 2 puffs into the lungs every 6 (six) hours as needed. Wheezing and shortness of breath 05/31/24 07/30/24 Yes Shahmehdi, Seyed A, MD  allopurinol  (ZYLOPRIM ) 300 MG tablet Take 300 mg by mouth daily.   Yes [provider]  Cyanocobalamin (VITAMIN B-12 CR PO) Take 1 tablet by mouth daily.   Yes [provider]  Ergocalciferol  50 MCG (2000 UT) TABS Take 2,000 Units by mouth daily.   Yes [provider]  fluticasone -salmeterol (ADVAIR) 500-50 MCG/ACT AEPB Inhale 1 puff into the lungs in the morning and at bedtime. 05/31/24 06/30/24 Yes Shahmehdi, Adriana LABOR, MD  gabapentin  (NEURONTIN ) 300 MG capsule Take 1-2 capsules by mouth at bedtime. 02/27/21  Yes [provider]  levothyroxine  (SYNTHROID ) 137 MCG tablet Take 137 mcg by mouth daily.   Yes [provider]  nystatin  (MYCOSTATIN /NYSTOP ) powder Apply topically 3 (three) times daily. 05/31/24  Yes Shahmehdi, Seyed A, MD  rosuvastatin  (CRESTOR ) 10 MG tablet Take 10 mg by mouth daily.   Yes [provider]  senna-docusate (SENOKOT-S) 8.6-50 MG tablet Take 2  tablets by mouth at bedtime. 05/31/24 06/30/24 Yes Shahmehdi, Adriana LABOR, MD    Current Facility-Administered Medications  Medication Dose Route Frequency Provider Last Rate Last Admin   0.9 %  sodium chloride  infusion (Manually program via Guardrails IV Fluids)   Intravenous Once Segars, Jonathan, MD       0.9 %  sodium chloride  infusion (Manually program via Guardrails IV Fluids)   Intravenous Once Segars, Jonathan, MD       0.9 %  sodium chloride  infusion (Manually program via Guardrails IV Fluids)   Intravenous Once Segars, Jonathan, MD       0.9 %  sodium chloride  infusion (Manually program via Guardrails IV Fluids)   Intravenous Once Segars, Jonathan, MD       0.9 %  sodium chloride  infusion  250 mL Intravenous Continuous Segars, Dorn, MD       acetaminophen  (TYLENOL ) tablet 650 mg  650 mg Oral Q6H PRN Onesimo Oneil LABOR, MD   650 mg at  06/14/24 9486   Or   acetaminophen  (TYLENOL ) suppository 650 mg  650 mg Rectal Q6H PRN Onesimo Oneil LABOR, MD       allopurinol  (ZYLOPRIM ) tablet 300 mg  300 mg Oral Daily Onesimo Oneil LABOR, MD   300 mg at 06/14/24 9170   Chlorhexidine  Gluconate Cloth 2 % PADS 6 each  6 each Topical Daily Onesimo Oneil LABOR, MD   6 each at 06/14/24 0831   fluticasone  furoate-vilanterol (BREO ELLIPTA ) 200-25 MCG/ACT 1 puff  1 puff Inhalation Daily Onesimo Oneil LABOR, MD   1 puff at 06/15/24 9292   gabapentin  (NEURONTIN ) capsule 300 mg  300 mg Oral QHS Onesimo Oneil LABOR, MD   300 mg at 06/14/24 2230   HYDROmorphone  (DILAUDID ) injection 0.5-1 mg  0.5-1 mg Intravenous Q4H PRN Onesimo Oneil LABOR, MD   1 mg at 06/15/24 0156   levothyroxine  (SYNTHROID ) tablet 137 mcg  137 mcg Oral Q0600 Onesimo Oneil LABOR, MD   137 mcg at 06/15/24 0624   magic mouthwash w/lidocaine   15 mL Oral QID PRN Onesimo Oneil LABOR, MD       norepinephrine  (LEVOPHED ) 4mg  in (0.016 mg/mL) premix infusion  0-40 mcg/min Intravenous Titrated Segars, Jonathan, MD 82.5 mL/hr at 06/15/24 0646 22 mcg/min at 06/15/24 9353   nystatin  (MYCOSTATIN /NYSTOP ) topical powder   Topical BID Onesimo Oneil LABOR, MD   Given at 06/14/24 0831   ondansetron  (ZOFRAN ) tablet 4 mg  4 mg Oral Q6H PRN Onesimo Oneil LABOR, MD   4 mg at 06/11/24 1716   Or   ondansetron  (ZOFRAN ) injection 4 mg  4 mg Intravenous Q6H PRN Onesimo Oneil LABOR, MD   4 mg at 06/15/24 0024   Oral care mouth rinse  15 mL Mouth Rinse PRN Onesimo Oneil LABOR, MD       oxyCODONE  (Oxy IR/ROXICODONE ) immediate release tablet 5 mg  5 mg Oral Q4H PRN Onesimo Oneil LABOR, MD   5 mg at 06/12/24 1916   pantoprazole  (PROTONIX ) injection 40 mg  40 mg Intravenous Q12H Segars, Jonathan, MD   40 mg at 06/15/24 9375   prochlorperazine (COMPAZINE) injection 10 mg  10 mg Intravenous Q6H PRN Segars, Jonathan, MD   10 mg at 06/15/24 0113   rosuvastatin  (CRESTOR ) tablet 10 mg  10 mg Oral Daily Onesimo Oneil LABOR, MD   10 mg at 06/14/24 9170   senna-docusate (Senokot-S) tablet  2 tablet  2 tablet Oral QHS Onesimo Oneil LABOR, MD   2 tablet at 06/14/24 2230  vasopressin (PITRESSIN) 20 Units in 100 mL (0.2 unit/mL) infusion-*FOR SHOCK*  0-0.03 Units/min Intravenous Continuous Segars, Jonathan, MD        Allergies as of 06/07/2024 - Review Complete 06/07/2024  Allergen Reaction Noted   Penicillins Anaphylaxis, Hives, Shortness Of Breath, and Swelling 11/12/2012   Biphosphate Other (See Comments) 05/27/2024   Fluticasone  Other (See Comments) 05/27/2024    Family History  Problem Relation Age of Onset   COPD Mother    Diabetes Mother    Congestive Heart Failure Mother    Diabetes Father    Heart attack Father     Social History   Socioeconomic History   Marital status: Married    Spouse name: Not on file   Number of children: Not on file   Years of education: Not on file   Highest education level: Not on file  Occupational History   Not on file  Tobacco Use   Smoking status: Every Day    Current packs/day: 1.50    Average packs/day: 1.5 packs/day for 28.0 years (42.0 ttl pk-yrs)    Types: Cigarettes   Smokeless tobacco: Never  Vaping Use   Vaping status: Never Used  Substance and Sexual Activity   Alcohol use: Yes    Alcohol/week: 1.0 - 3.0 standard drink of alcohol    Types: 1 - 3 Standard drinks or equivalent per week    Comment: couple beers on the weekend   Drug use: No   Sexual activity: Yes    Partners: Male    Birth control/protection: Surgical    Comment: Hysterectomy  Other Topics Concern   Not on file  Social History Narrative   Not on file   Social Drivers of Health   Financial Resource Strain: Not on file  Food Insecurity: No Food Insecurity (06/08/2024)   Hunger Vital Sign    Worried About Running Out of Food in the Last Year: Never true    Ran Out of Food in the Last Year: Never true  Transportation Needs: No Transportation Needs (06/08/2024)   PRAPARE - Administrator, Civil Service (Medical): No    Lack of  Transportation (Non-Medical): No  Physical Activity: Not on file  Stress: Not on file  Social Connections: Unknown (06/08/2024)   Social Connection and Isolation Panel    Frequency of Communication with Friends and Family: Not on file    Frequency of Social Gatherings with Friends and Family: Once a week    Attends Religious Services: 1 to 4 times per year    Active Member of [employer] or Organizations: No    Attends Banker Meetings: Never    Marital Status: Married  Recent Concern: Social Connections - Moderately Isolated (05/27/2024)   Social Connection and Isolation Panel    Frequency of Communication with Friends and Family: Once a week    Frequency of Social Gatherings with Friends and Family: Once a week    Attends Religious Services: 1 to 4 times per year    Active Member of [employer] or Organizations: No    Attends Banker Meetings: Never    Marital Status: Married  Catering manager Violence: Not At Risk (06/08/2024)   Humiliation, Afraid, Rape, and Kick questionnaire    Fear of Current or Ex-Partner: No    Emotionally Abused: No    Physically Abused: No    Sexually Abused: No     Review of Systems   Gen: Denies any fever, chills, loss of appetite,  change in weight or weight loss CV: Denies chest pain, heart palpitations, syncope, edema  Resp: +chronic dyspnea on exertion GI: see HPI GU : Denies urinary burning, blood in urine, urinary frequency, and urinary incontinence. MS: see HPI Derm: Denies rash, itching, dry skin, hives. Psych: Denies depression, anxiety, memory loss, hallucinations, and confusion. Heme: see HPI Neuro:  Denies any headaches, dizziness, paresthesias, shaking  Physical Exam   Vital Signs in last 24 hours: Temp:  [97.5 F (36.4 C)-98.3 F (36.8 C)] 98 F (36.7 C) (06/23 0718) Pulse Rate:  [91-122] 96 (06/23 0718) Resp:  [17-28] 18 (06/23 0718) BP: (71-108)/(33-67) 90/53 (06/23 0718) SpO2:  [93 %-100 %] 94 % (06/23  0718) FiO2 (%):  [21 %] 21 % (06/22 2255) Last BM Date : 06/13/24  General:   Alert,  acutely ill-appearing, pale,  Head:  Normocephalic and atraumatic. Eyes:  Sclera clear, no icterus.    Lungs:  scattered rhonchi, expiratory wheeze Heart:  S1 S2 present, no obvious murmur,  Abdomen:  Soft, obese, large left-sided ventral hernia, ostomy with bag full of melanotic stool, air in bag, no significant tenderness to palpation. No distension or guarding. Bowel sounds present but hypoactive Rectal: deferred   Extremities:  generalized anasarca Neurologic:  Alert and  oriented to person, place, situation   Intake/Output from previous day: 06/22 0701 - 06/23 0700 In: 2066 [P.O.:900; I.V.:46; Blood:120; IV Piggyback:1000] Out: 1350 [Urine:1200; Stool:150] Intake/Output this shift: Total I/O In: 58 [Blood:58] Out: -    Labs/Studies   Recent Labs Recent Labs    06/13/24 0403 06/14/24 0309 06/14/24 1941 06/14/24 2347 06/15/24 0154  WBC 21.9* 18.1*  --   --   --   HGB 8.6* 7.3* 7.0* 6.2* 6.3*  HCT 28.7* 23.3* 22.2* 19.8* 20.2*  PLT 64* PLATELETS APPEAR DECREASED  --   --   --    BMET Recent Labs    06/13/24 0403 06/14/24 0309 06/15/24 0154  NA 134* 134* 136  K 5.5* 4.4 4.7  CL 106 104 102  CO2 24 25 21*  GLUCOSE 111* 82 112*  BUN 36* 45* 72*  CREATININE 1.27* 1.18* 1.29*  CALCIUM  8.3* 8.5* 8.9     Radiology/Studies DG Chest Port 1 View Result Date: 06/15/2024 EXAM: 1 VIEW XRAY OF THE CHEST 06/15/2024 06:03:00 AM COMPARISON: 1 view chest x-ray dated 06/07/2024. CLINICAL HISTORY: 222481 S/P PICC central line placement [id number]. PICC central line plcmt S/P PICC central line placement PICC central line plcmt FINDINGS: LUNGS AND PLEURA: Asymmetric left basilar airspace disease and effusion is again noted. HEART AND MEDIASTINUM: No acute abnormality of the cardiac and mediastinal silhouettes. BONES AND SOFT TISSUES: No acute osseous abnormality. LINES AND TUBES: A new right IJ  line terminates at the cavoatrial junction. No pneumothorax is present. IMPRESSION: 1. New right IJ line terminates at the cavoatrial junction. 2. Asymmetric left basilar airspace disease and effusion, again noted. Electronically signed by: Lonni Necessary MD 06/15/2024 06:18 AM EDT RP Workstation: [id number]   DG Abd 1 View Result Date: 06/15/2024 CLINICAL DATA:  Unspecified abdominal pain EXAM: ABDOMEN - 1 VIEW COMPARISON:  None Available. FINDINGS: The bowel gas pattern is normal. No radio-opaque calculi or other significant radiographic abnormality are seen. Mild gaseous distension of the stomach. Nonobstructive bowel gas pattern. Moderate stool burden. No free intraperitoneal gas. Rounded 6 mm calcification within the left paraspinal region may represent a renal calculus. Coarse calcification within the left lower quadrant is nonspecific, possibly the sequela remote trauma or inflammation.  Numerous surgical clips seen within the epigastrium and left lower quadrant. Left femoral ORIF partially visualized. No acute bone abnormality. IMPRESSION: 1. Nonobstructive bowel gas pattern. Moderate stool burden. 2. Possible 6 mm left renal calculus. Electronically Signed   By: Dorethia Molt M.D.   On: 06/15/2024 03:03     Assessment   Christina Castaneda is a 68 y.o. year old female  with a history of asthma, COPD, HTN, hypothyroidism, perforated diverticulitis s/p sigmoid colectomy with colostomy in 2017 at outside hospital and complicated by fistula with stomal necrosis and peritonitis requiring prolonged hospitalization in [place] s/p ex laparotomy with debridement, colostomy removal with partial colectomy, omentectomy, left colectomy and end colostomy (April 2017), multiple abdominal procedures with wound VAC to assist with wound closure, status post fall over  a week ago and presented to ED with left distal femur fracture, AKI, underwent fixation with left femoral nail by ortho on 6/20, now with GI consulted in  light of rapid transit GI bleed with hypovolemic shock requiring pressor support and transfer to the ICU.    GI bleed: noting acute melanotic stool overnight in ostomy bag along with severe abdominal pain. Hgb 11 range pre-operatively, 9 range post op, slow trending over last 24 hours with drop to 6.2 overnight, worsening thrombocytopenia from 140 to 64 yesterday. No chronic history of thrombocytopenia. No known hx of cirrhosis. Melena continues and both levophed  and vasopressin on board. At time of consultation, levo at 26 mcg and slowly being titrated down to 22 mcg as of 10am, with SBP in the 140s now. 2 units PRBCs received with 3rd unit infusing, and she has received 1 unit platelets thus far. No prior EGD or chronic history of any UGI symptoms. No prior NSAIDs reported or anticoagulation. CTA is pending. Lovenox  last received yesterday afternoon and now has been discontinued.   Discussed with Dr. Shaaron and Dr. Vicci. Will tentatively plan on bedside EGD in ICU and need to continue with resuscitative efforts. Discussed with Dr. Vicci acute thrombocytopenia, and he has ordered HIT antibody panel. Continue with IV BID PPI and transfusion. To reduce risk of aspiration, will need to be intubated electively prior to procedure. I have discussed this with nursing staff as well.   Called and spoke with Sheena Starcher, patient's husband. He was updated on EGD, intubation prior. He also states he is not aware of any liver disease, no prior PUD, but she does drink 1-2 beers a week. He is agreeable and thankful for the updated and will come later this afternoon.   Plan / Recommendations    Continue with resuscitative efforts, transfusion IV BID PPI Pending CTA HIT antibody pending Plans for bedside EGD with intubation prior. Discussed with nursing staff who will help coordinate Further recommendations to follow     06/15/2024, 7:36 AM  Therisa MICAEL Stager, PhD, ANP-BC [facility] G[facility]enter[facility]     Addendum: CTA report concerning for perforated PUD, scattered free air. EGD canceled. Surgery has been consulted by hospitalist.   Therisa MICAEL Stager, PhD, ANP-BC [facility]astroenter[facility]

## 2024-06-15 NOTE — Anesthesia Preprocedure Evaluation (Signed)
 Anesthesia Evaluation  Patient identified by MRN, date of birth, ID band Patient awake    Reviewed: Allergy & Precautions, H&P , NPO status , Patient's Chart, lab work & pertinent test results, reviewed documented beta blocker date and time   Airway Mallampati: II  TM Distance: >3 FB Neck ROM: full    Dental no notable dental hx.    Pulmonary neg pulmonary ROS, Current Smoker and Patient abstained from smoking.   Pulmonary exam normal breath sounds clear to auscultation       Cardiovascular Exercise Tolerance: Good hypertension, negative cardio ROS  Rhythm:regular Rate:Normal     Neuro/Psych negative neurological ROS  negative psych ROS   GI/Hepatic negative GI ROS, Neg liver ROS,,,  Endo/Other  negative endocrine ROS    Renal/GU negative Renal ROS  negative genitourinary   Musculoskeletal   Abdominal   Peds  Hematology negative hematology ROS (+)   Anesthesia Other Findings   Reproductive/Obstetrics negative OB ROS                             Anesthesia Physical Anesthesia Plan  ASA: 4 and emergent  Anesthesia Plan: General and General ETT   Post-op Pain Management:    Induction: Rapid sequence and Cricoid pressure planned  PONV Risk Score and Plan: Ondansetron   Airway Management Planned:   Additional Equipment: Arterial line  Intra-op Plan:   Post-operative Plan: Post-operative intubation/ventilation  Informed Consent: I have reviewed the patients History and Physical, chart, labs and discussed the procedure including the risks, benefits and alternatives for the proposed anesthesia with the patient or authorized representative who has indicated his/her understanding and acceptance.     Dental Advisory Given  Plan Discussed with: CRNA  Anesthesia Plan Comments:        Anesthesia Quick Evaluation

## 2024-06-15 NOTE — Progress Notes (Addendum)
 PROGRESS NOTE   Christina Castaneda  FMW:[id number] DOB: 28-Mar-1956 DOA: 06/07/2024 PCP: [name], DO   Chief Complaint  Patient presents with   Fall   Level of care: ICU  Brief Admission History:  68 y.o. female with medical history of nicotine abuse, HTN, hypothyroidism who fell while trying to sit on her toilet at home in the middle of the night on Tuesday.  She was not able to get up and EMS was called.  She was transferred from the bathroom to her bed by EMS who then left her at home.  Since that date she has not been able to stand or ambulate.  She was just discharged from the hospital on June 8.  She states she had a touch of pneumonia at that time.   Patient had imaging studies demonstrating acute comminuted fracture of the distal left femur and she is noted to have severe AKI with hyperkalemia as well likely prerenal in the setting of poor oral intake along with use of ACE inhibitor.  Plans are for likely orthopedic intervention by 6/19 once AKI further improves.   Assessment and Plan:  Hemorrhagic Shock Duodenal perforation  --pt developed acute change in status overnight, drop in Hg, acute abdominal pain, CT ordered by Dr Keturah revealing perforated peptic ulcer with hemorrhage, I consulted with surgeon STAT, pt now on 2 pressors (norepinephrine , vasopressin), transfused 3 units PRBCs, discussed with Dr. Evonnie and the recommendation is for patient to transfer to [facility] ICU postop given she will be intubated on pressors and high risk for further decompensation, I called and spoke with her husband about change in status need for surgery and high risk for complications, he verbalized understanding, also spoke with patient at bedside, she is mentating well, she verbalized understanding and wishes to proceed with surgery.  I also contacted PCCM at [facility] and patient was accepted by Dr. Brutus to [facility] ICU. Transfer orders placed.  Pt on broad spectrum antibiotics per surgery.   Left  femur fracture - Acute comminuted fracture of distal femur with moderate to large joint effusion -- She likely obtained this fracture on 6/10 when she fell while trying to get on the commode - High risk for DVT as she has not been out of bed since fall on Tuesday- continue Heparin  prophylaxis until surgery can be performed - postop s/p Retrograde left femoral nail on 6/20 with Dr. Onesimo, continue postop care     AKI, hyperkalemia in setting of ACE inhibitor use-actively treated  --K up to 5.6 on 6/21, confirmed with repeat test, treated with lokelma , lasix , dextrose /insulin , down to 5.1 prior to surgery, continue to follow daily labs -- baseline Cr 0.8 -Initial labs showed BUN 117 with a creatinine of 4.50 and a potassium of 5.3 --creatinine improved to 1.18  -Suspect prerenal-she does not admit to poor oral intake but notes from last admission state that her oral intake was poor but she was noted to have some urinary retention as well and foley is in place -- Renal ultrasound unremarkable - home benicar  held - Foley placed for accurate input/output -- CK is not elevated -- treated with IV fluids with D5 and avoid nephrotoxic agents -- lokelma  was given for hyperkalemia   Acute urinary retention --foley cath remains in place since she is going to OR today --foley remains in place now due to critical illness   Mild hypoglycemia - resolved after dextrose  fluids and diet   Oral stomatitis -- reporting mouth pain, ordered  for magic mouthwash PRN, soft foods diet    Acute hypoxic respiratory failure - Pulse ox is 88 to 89%-EMS placed 2 L of oxygen on her - The patient does wear oxygen at home during nighttime - Portable chest x-ray that was performed earlier shows left basilar consolidation - Suspect she has atelectasis secondary to not being out of bed for days and this is likely superimposed on COPD     COPD-ongoing cigarette use - The patient states that she has never been told  that she has emphysema or COPD and continues to smoke about half to 3/4 pack/day - She has seen [facility] in the past, seen by Dr. Darlean and is maintained on Advair and albuterol  - She declines a nicotine patch - Smoking cessation counseling done   Leukocytosis-resolved  - marked rise in WBC due to perforated viscus, now on broad spectrum antibiotics  Hypomagnesemia -- IV replacement ordered  -- repleted     OSA (obstructive sleep apnea) - CPAP ordered nightly prior to being intubated    Colostomy with history of colocutaneous fistula - She had been able to manage her colostomy herself but now ICU care required   Obesity Estimated body mass index is 38.52 kg/m as calculated from the following:   Height as of this encounter: 5' 5 (1.651 m).   Weight as of this encounter: 105 kg.   DVT prophylaxis: SQ heparin  Code Status: Full  Communication: discussed plan of care in detail with patient in person and husband by phone who verbalized understanding Disposition: transfer to [facility] ICU after surgery, discussed with PCCM accepted by Dr. Jennet Epley.      Consultants:  Orthopedics   Procedures:  Retrograde left femoral nail 6/20  Antimicrobials:    Subjective: Pt had acute decompensation overnight, now is on 2 pressors and able to speak, says she wants to get back to sleep; she denies further abdominal pain symptoms.   Objective: Vitals:   06/15/24 1125 06/15/24 1200 06/15/24 1209 06/15/24 1210  BP:  (!) 98/58 (!) 98/58 (!) 110/50  Pulse:  99 99 98  Resp:  15 15 18   Temp: 98.2 F (36.8 C)  98.2 F (36.8 C)   TempSrc: Oral  Oral   SpO2:  95% 95% 94%  Weight:   105 kg   Height:   5' 5 (1.651 m)     Intake/Output Summary (Last 24 hours) at 06/15/2024 1324 Last data filed at 06/15/2024 1136 Gross per 24 hour  Intake 3264.51 ml  Output 1750 ml  Net 1514.51 ml   Filed Weights   06/07/24 1700 06/12/24 1119 06/15/24 1209  Weight: 105 kg 105 kg 105 kg    Examination:  General exam: Appears calm and NAD, chronically ill appearing, morbidly obese.   Respiratory system: Clear to auscultation. Respiratory effort normal. Cardiovascular system: normal S1 & S2 heard. No JVD, murmurs, rubs, gallops or clicks. No pedal edema. Gastrointestinal system: Abdomen is obese, mildly distended, large ventral hernia, soft and mild generalized tenderness. No organomegaly or masses felt. hypoacive bowel sounds heard. Central nervous system: Alert and oriented. No focal neurological deficits. Extremities: left leg swollen in bandages, warm feet with good cap refill in left foot Skin: No rashes, lesions or ulcers. Psychiatry: Judgement and insight appear unable to determine. Mood & affect appropriate.   Data Reviewed: I have personally reviewed following labs and imaging studies  CBC: Recent Labs  Lab 06/11/24 0421 06/12/24 9573 06/13/24 0403 06/14/24 0309 06/14/24 1941 06/14/24 2347  06/15/24 0154 06/15/24 1142  WBC 10.3 14.9* 21.9* 18.1*  --   --   --  23.2*  HGB 9.8* 10.4* 8.6* 7.3* 7.0* 6.2* 6.3* 9.9*  HCT 31.7* 34.6* 28.7* 23.3* 22.2* 19.8* 20.2* 28.9*  MCV 103.9* 102.4* 102.9* 101.3*  --   --   --  96.3  PLT 140* 104* 64* PLATELETS APPEAR DECREASED  --   --   --  149*    Basic Metabolic Panel: Recent Labs  Lab 06/09/24 0442 06/10/24 0423 06/11/24 0421 06/11/24 0451 06/12/24 0426 06/12/24 0812 06/12/24 1036 06/13/24 0403 06/14/24 0309 06/15/24 0154  NA 138 139 137  --  137  --   --  134* 134* 136  K 4.8 4.9 4.9  --  5.6* 5.5* 5.1 5.5* 4.4 4.7  CL 107 109 108  --  109  --   --  106 104 102  CO2 22 24 24   --  25  --   --  24 25 21*  GLUCOSE 78 79 84  --  95  --   --  111* 82 112*  BUN 80* 71* 54*  --  43*  --   --  36* 45* 72*  CREATININE 2.73* 2.02* 1.66*  --  1.44*  --   --  1.27* 1.18* 1.29*  CALCIUM  8.2* 8.3* 8.6*  --  8.9  --   --  8.3* 8.5* 8.9  MG 1.8 1.6*  --  2.0  --   --   --   --  1.5* 2.2    CBG: Recent Labs  Lab  06/13/24 1739 06/14/24 0005 06/14/24 0528 06/14/24 1128 06/14/24 1747  GLUCAP 119* 98 88 88 95    Recent Results (from the past 240 hours)  Surgical pcr screen     Status: None   Collection Time: 06/10/24  4:00 PM   Specimen: Nasal Mucosa; Nasal Swab  Result Value Ref Range Status   MRSA, PCR NEGATIVE NEGATIVE Final   Staphylococcus aureus NEGATIVE NEGATIVE Final    Comment: (NOTE) The Xpert SA Assay (FDA approved for NASAL specimens in patients 50 years of age and older), is one component of a comprehensive surveillance program. It is not intended to diagnose infection nor to guide or monitor treatment. Performed at [facility], [address] [place], [place] [address]      Radiology Studies: CT ANGIO AO+BIFEM W & OR WO CONTRAST Result Date: 06/15/2024 CLINICAL DATA:  Hemorrhagic shock.  Evaluate for site of bleeding. EXAM: CT ANGIOGRAPHY OF ABDOMINAL AORTA WITH ILIOFEMORAL RUNOFF TECHNIQUE: Multidetector CT imaging of the abdomen, pelvis and lower extremities was performed using the standard protocol during bolus administration of intravenous contrast. Multiplanar CT image reconstructions and MIPs were obtained to evaluate the vascular anatomy. RADIATION DOSE REDUCTION: This exam was performed according to the departmental dose-optimization program which includes automated exposure control, adjustment of the mA and/or kV according to patient size and/or use of iterative reconstruction technique. CONTRAST:  OMNIPAQUE  IOHEXOL  300 MG/ML  SOLN COMPARISON:  Prior renal ultrasound 06/08/2024 FINDINGS: VASCULAR Aorta: Normal caliber aorta without aneurysm, dissection, vasculitis or significant stenosis. Scattered calcified atherosclerotic plaque. Celiac: Patent without evidence of aneurysm, dissection, vasculitis or significant stenosis. SMA: Patent without evidence of aneurysm, dissection, vasculitis or significant stenosis. Renals: 2 right-sided renal arteries. Single left renal  artery. Calcified atherosclerotic plaque bilaterally with at least moderate stenosis on the left and mild to moderate stenosis on the right. IMA: Atretic, likely chronic high-grade stenosis or  occlusion of the origin. RIGHT Lower Extremity Inflow: Scattered atherosclerotic plaque without significant stenosis or occlusion. No aneurysm or dissection. Outflow: Similarly, mild scattered atherosclerotic plaque without significant stenosis or occlusion involving the common femoral, superficial femoral or profunda femoral arteries. The popliteal artery is widely patent. Runoff: High origin of the anterior tibial artery arising from the proximal P3 segment of the popliteal artery at the level of the knee joint. Patent 3 vessel runoff to the ankle. LEFT Lower Extremity Inflow: Mild scattered calcified atherosclerotic plaque without significant stenosis or occlusion. No aneurysm or dissection. Outflow: Common femoral, profunda femoral and superficial femoral arteries are widely patent. No evidence of active bleeding. Runoff: High origin of the anterior tibial artery arising from the P2 segment of the popliteal artery at the level of the lower pole of the patella. Patent 3 vessel runoff to the ankle. No evidence of active bleeding. Veins: No focal venous abnormality. Review of the MIP images confirms the above findings. NON-VASCULAR Lower chest: Small bilateral pleural effusions and associated bibasilar atelectasis. Hepatobiliary: Normal hepatic contour and morphology. No discrete hepatic lesion. Surgical changes of cholecystectomy. No evidence of biliary ductal dilatation. Pancreas: Unremarkable. No pancreatic ductal dilatation or surrounding inflammatory changes. Spleen: Normal in size without focal abnormality. Adrenals/Urinary Tract: Unremarkable adrenal glands. No hydronephrosis or enhancing renal mass. 1.9 cm water  attenuation simple cyst exophytic from the interpolar right kidney. No imaging follow-up is recommended. 9  mm nonobstructing stone in the interpolar left renal collecting system. The ureters are unremarkable. A Foley catheter is in place. Stomach/Bowel: Limited evaluation for GI bleeding given scan technique. There is no pre contrast imaging and no delayed imaging. Scattered high attenuation material is present throughout the distal bowel consistent with prior barium administration. There is a small amount of high attenuation material layering dependently within the stomach as well as within the gastric antrum and duodenal bulb. However, there is intermediate attenuation density expanding the distal gastric antrum, duodenal bulb and proximal duodenum highly concerning for hematoma. Additionally, some high attenuation material is present within this presumed hematoma which does not appear to be layering dependently and is concerning for a focus of active arterial extravasation. The gastric antral and proximal duodenal wall are thickened and there is inflammatory stranding in the periduodenal fat. Additionally, several small bubbles are present immediately adjacent to the duodenal bulb. Findings suggest possible perforated peptic ulcer disease with probable bleeding. 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 Lymphatic: No suspicious lymphadenopathy. Reproductive: Status post hysterectomy. No adnexal masses. Other: Large midline ventral hernia containing the anterior body of the stomach, portions of the ascending colon and numerous loops of small bowel. Additionally, there is a moderate volume of free air scattered throughout abdomen. Musculoskeletal: Extensive multilevel degenerative disc disease. Comminuted distal left femoral fracture status post ORIF with an intramedullary nail. There are 2 proximal and 3 distal interlocking screws. The distal interlocking screws expected small volume hematoma surrounding the comminuted fracture  site. No evidence of arterial injury or arterial bleeding. Multilevel degenerative disc disease is present. Bilateral lower lumbar facet arthropathy also noted. IMPRESSION: 1. Overall CT findings are highly concerning for perforated and bleeding peptic ulcer disease affecting the proximal duodenum, potentially stress related peptic ulcer disease. Additionally, while the study is not optimized to detect GI bleeding (no precontrast or delayed phase imaging) there appears to be hematoma within the gastric antrum and proximal duodenum with some scattered high attenuation material which may represent active  extravasation versus ingested oral contrast (there is evidence of oral contrast elsewhere in the bowel). Hemorrhage is favored given the clinical history and the evidence of perforated ulceration. Scattered free air throughout the upper abdomen and within the ventral hernia sac. 2. Large ventral abdominal hernia containing the gastric body, a portion of the ascending colon and multiple loops of small bowel as well as a moderate volume of free air. 3. No evidence of significant hemorrhage related to the comminuted distal femoral fracture or recent ORIF. There is a small amount of expected Peri fracture hematoma but no evidence of arterial injury or active bleeding. 4. Extensive scattered calcified atherosclerotic plaque as detailed above. 5. Bilateral high origins of the anterior tibial arteries. 6. Small bilateral pleural effusions and associated atelectasis. 7. Surgical changes of prior left hemicolectomy with Hartmann's pouch and diverting right lower quadrant colostomy. 8. Surgical changes of prior cholecystectomy. 9. Advanced multilevel degenerative disc disease and lower lumbar facet arthropathy. Critical Value/emergent results were called by telephone at the time of interpretation on 06/15/2024 at 10:40 am to provider [facility] , who verbally acknowledged these results. Electronically Signed   By: Wilkie Lent M.D.   On: 06/15/2024 10:52   DG Chest Port 1 View Result Date: 06/15/2024 EXAM: 1 VIEW XRAY OF THE CHEST 06/15/2024 06:03:00 AM COMPARISON: 1 view chest x-ray dated 06/07/2024. CLINICAL HISTORY: [id number] S/P PICC central line placement [id number]. PICC central line plcmt S/P PICC central line placement PICC central line plcmt FINDINGS: LUNGS AND PLEURA: Asymmetric left basilar airspace disease and effusion is again noted. HEART AND MEDIASTINUM: No acute abnormality of the cardiac and mediastinal silhouettes. BONES AND SOFT TISSUES: No acute osseous abnormality. LINES AND TUBES: A new right IJ line terminates at the cavoatrial junction. No pneumothorax is present. IMPRESSION: 1. New right IJ line terminates at the cavoatrial junction. 2. Asymmetric left basilar airspace disease and effusion, again noted. Electronically signed by: Lonni Necessary MD 06/15/2024 06:18 AM EDT RP Workstation: [id number]   DG Abd 1 View Result Date: 06/15/2024 CLINICAL DATA:  Unspecified abdominal pain EXAM: ABDOMEN - 1 VIEW COMPARISON:  None Available. FINDINGS: The bowel gas pattern is normal. No radio-opaque calculi or other significant radiographic abnormality are seen. Mild gaseous distension of the stomach. Nonobstructive bowel gas pattern. Moderate stool burden. No free intraperitoneal gas. Rounded 6 mm calcification within the left paraspinal region may represent a renal calculus. Coarse calcification within the left lower quadrant is nonspecific, possibly the sequela remote trauma or inflammation. Numerous surgical clips seen within the epigastrium and left lower quadrant. Left femoral ORIF partially visualized. No acute bone abnormality. IMPRESSION: 1. Nonobstructive bowel gas pattern. Moderate stool burden. 2. Possible 6 mm left renal calculus. Electronically Signed   By: Dorethia Molt M.D.   On: 06/15/2024 03:03    Scheduled Meds:  [MAR Hold] sodium chloride    Intravenous Once   [MAR Hold] sodium chloride     Intravenous Once   [MAR Hold] allopurinol   300 mg Oral Daily   [MAR Hold] Chlorhexidine  Gluconate Cloth  6 each Topical Daily   [MAR Hold] fluticasone  furoate-vilanterol  1 puff Inhalation Daily   [MAR Hold] gabapentin   300 mg Oral QHS   [MAR Hold] levothyroxine   137 mcg Oral Q0600   [MAR Hold] nystatin    Topical BID   [MAR Hold] pantoprazole  (PROTONIX ) IV  40 mg Intravenous Q12H   [MAR Hold] rosuvastatin   10 mg Oral Daily   [MAR Hold] senna-docusate  2 tablet Oral QHS   [  MAR Hold] sodium chloride  flush  3 mL Intravenous Q12H   Continuous Infusions:  sodium chloride  10 mL/hr at 06/15/24 1136   [MAR Hold] sodium chloride      ciprofloxacin      fluconazole (DIFLUCAN) IV     metroNIDAZOLE      [MAR Hold] norepinephrine  (LEVOPHED ) Adult infusion 2 mcg/min (06/15/24 1150)   piperacillin-tazobactam (ZOSYN)  IV     [START ON 06/16/2024] vancomycin      vancomycin      vasopressin 0.03 Units/min (06/15/24 1136)    LOS: 8 days   Critical Care Procedure Note Authorized and Performed by: KYM Louder MD  Total Critical Care time:  80 mins Due to a high probability of clinically significant, life threatening deterioration, the patient required my highest level of preparedness to intervene emergently and I personally spent this critical care time directly and personally managing the patient.  This critical care time included obtaining a history; examining the patient, pulse oximetry; ordering and review of studies; arranging urgent treatment with development of a management plan; evaluation of patient's response of treatment; frequent reassessment; and discussions with other providers.  This critical care time was performed to assess and manage the high probability of imminent and life threatening deterioration that could result in multi-organ failure.  It was exclusive of separately billable procedures and treating other patients and teaching time.   Afton Louder, MD How to contact the [facility]  Attending or Consulting provider 7A - 7P or covering provider during after hours 7P -7A, for this patient?  Check the care team in [facility] and look for a) attending/consulting [facility] provider listed and b) the [facility] team listed Log into [email or web address] to find provider on call.  Locate the [facility] provider you are looking for under [facility] and page to a number that you can be directly reached. If you still have difficulty reaching the provider, please page the [facility] (Director on Call) for the Hospitalists listed on amion for assistance.  06/15/2024, 1:24 PM

## 2024-06-15 NOTE — Progress Notes (Signed)
   06/15/24 0139  Vitals  Temp (!) 97.5 F (36.4 C)  Temp Source Oral  BP 104/67  MAP (mmHg) 77  BP Location Left Arm  BP Method Automatic  Patient Position (if appropriate) Lying  Pulse Rate (!) 122  Pulse Rate Source Dinamap  Resp (!) 28  Level of Consciousness  Level of Consciousness Alert  MEWS COLOR  MEWS Score Color Red  Oxygen Therapy  SpO2 100 %  O2 Device Room Air  MEWS Score  MEWS Temp 0  MEWS Systolic 0  MEWS Pulse 2  MEWS RR 2  MEWS LOC 0  MEWS Score 4

## 2024-06-15 NOTE — Progress Notes (Signed)
   06/15/24 0319  Vitals  Temp (!) 97.5 F (36.4 C)  Temp Source Oral  BP (!) 78/37  MAP (mmHg) (!) 49  BP Location Right Leg  BP Method Automatic  Patient Position (if appropriate) Lying  Pulse Rate (!) 108  Pulse Rate Source Dinamap  Resp (!) 22  MEWS COLOR  MEWS Score Color Red  Oxygen Therapy  SpO2 93 %  O2 Device Room Air  MEWS Score  MEWS Temp 0  MEWS Systolic 2  MEWS Pulse 1  MEWS RR 1  MEWS LOC 0  MEWS Score 4

## 2024-06-15 NOTE — Progress Notes (Addendum)
 Pt on Red MEWS, BP decreased, MD notified.     06/15/24 0308  Vitals  Temp (!) 97.5 F (36.4 C)  Temp Source Oral  BP (!) 72/38  MAP (mmHg) (!) 45  BP Location Left Arm  BP Method Automatic  Patient Position (if appropriate) Lying  Pulse Rate (!) 106  Pulse Rate Source Dinamap  Resp (!) 22  MEWS COLOR  MEWS Score Color Red  Oxygen Therapy  SpO2 100 %  O2 Device Room Air  MEWS Score  MEWS Temp 0  MEWS Systolic 2  MEWS Pulse 1  MEWS RR 1  MEWS LOC 0  MEWS Score 4

## 2024-06-15 NOTE — Consult Note (Signed)
 [facility] Consult  Reason for Consult: pneumoperitoneum, concern for possible perforated and bleeding ulcer Referring Physician: Dr. Vicci  Chief Complaint   Fall     HPI: Christina Castaneda is a 68 y.o. female who was admitted to the hospital with a left femur fracture status post retrograde left femoral nail on 6/20 with Dr. Onesimo.  His multiple other medical problems during this admission including AKI, acute hypoxic respiratory failure with COPD, and a persistent leukocytosis thought to be possibly secondary to a recently treated pneumonia.  Overnight this morning, patient was noted to be tachycardic into the 120s with downtrending blood pressure and melanotic stool from her colostomy.  Blood work demonstrated hemoglobin of 6.2 and platelets unable to be read out, so she was ordered to receive a unit of PRBCs and platelets overnight.  There was concern for a potential bleeding ulcer.  Central venous catheter was inserted by the ED physician, and Levophed  was initiated in addition to vasopressin.  Levophed  was titrated up to 25.  She subsequently underwent a CTA of the abdomen and aorta with iliofemoral runoff.  This imaging study is demonstrating concern for perforated and bleeding peptic ulcer affecting the proximal duodenum.  Active extravasation was not able to be visualized, however there is a hematoma within the gastric antrum and proximal duodenum in addition to pneumoperitoneum within the upper abdomen and within a ventral hernia.  She also has a large ventral hernia containing the gastric body, portion of ascending colon, and multiple loops of small bowel.  This morning, the patient has some upper abdominal pain with palpation, but otherwise is doing okay.  She denies nausea and vomiting.  Her biggest statement is that she wants to sleep.  She currently is on 5 of Levophed  and vasopressin.  She has received 3 units PRBCs and 1 unit of platelets.  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  Past Medical History:  Diagnosis Date   Asthma    mild   Atypical endometrial hyperplasia    COPD (chronic obstructive pulmonary disease) (HCC)    Goiter    Gout    Heart murmur    Hypertension    Hypothyroidism    Vitamin B12 deficiency    Yeast infection    for last 3 weeks    Past Surgical History:  Procedure Laterality Date   ABDOMINAL WALL DEFECT REPAIR N/A 04/18/2016   Procedure: CLOSURE OF ABDOMEN;  Surgeon: Debby Shipper, MD;  Location: [facility] OR;  Service: General;  Laterality: N/A;   APPENDECTOMY     APPLICATION OF WOUND VAC N/A 04/02/2016   Procedure: application of wound vac+;  Surgeon: Camellia Blush, MD;  [facility] OR;  Service: General;  Laterality: N/A;   CHOLECYSTECTOMY  2009   gallstone removed   COLOSTOMY N/A 03/30/2016   Procedure: COLOSTOMY;  Surgeon: Donnice Bury, MD;  Location: [facility] OR;  Service: General;  Laterality: N/A;   COLOSTOMY REVISION N/A 03/28/2016   Procedure: COLOSTOMY REVISION;  Surgeon: Lynwood Pina, MD;  Location: [facility] OR;  Service: General;  Laterality: N/A;   COLOSTOMY REVISION N/A 03/30/2016   Procedure: COLON RESECTION LEFT;  Surgeon: Donnice Bury, MD;  Location: [facility] OR;  Service: General;  Laterality: N/A;  LAPAROTOMY N/A 03/28/2016   Procedure: EXPLORATORY LAPAROTOMY;  Surgeon: Lynwood Pina, MD;  Location: [facility] OR;  Service: General;  Laterality: N/A;   LAPAROTOMY N/A 04/02/2016   Procedure: Re-exploration of open abdomen, application of abdominal wound vac;  Surgeon: Camellia Blush, MD;  Location: [facility] OR;  Service: General;  Laterality: N/A;   LAPAROTOMY N/A 04/04/2016   Procedure: EXPLORATORY LAPAROTOMY,  PLACEMENT OF ABRA ABDOMINAL WALL CLOSURE SET ;  Surgeon: Camellia Blush, MD;  Location: [facility] OR;  Service: General;  Laterality: N/A;   LAPAROTOMY N/A 04/18/2016   Procedure: EXPLORATORY LAPAROTOMY;  Surgeon: Debby Shipper, MD;  Location: [facility] OR;  Service: General;  Laterality: N/A;   NECK SURGERY  1994   repair disk   OMENTECTOMY N/A 03/28/2016   Procedure: OMENTECTOMY;  Surgeon: Lynwood Pina, MD;  Location: [facility] OR;  Service: General;  Laterality: N/A;   ROBOTIC ASSISTED TOTAL HYSTERECTOMY WITH BILATERAL SALPINGO OOPHERECTOMY  11/18/2012   Procedure: ROBOTIC ASSISTED TOTAL HYSTERECTOMY WITH BILATERAL SALPINGO OOPHORECTOMY;  Surgeon: Elenore A. Dodie, MD;  Location: [facility] ORS;  Service: Gynecology;  Laterality: N/A;   TONSILLECTOMY     as a child   VACUUM ASSISTED CLOSURE CHANGE N/A 03/30/2016   Procedure: ABDOMINAL VAC CHANGE;  Surgeon: Donnice Bury, MD;  Location: [facility] OR;  Service: General;  Laterality: N/A;   WOUND DEBRIDEMENT N/A 03/28/2016   Procedure: DEBRIDEMENT WOUND;  Surgeon: Lynwood Pina, MD;  Location: [facility] OR;  Service: General;  Laterality: N/A;    Family History  Problem Relation Age of Onset   COPD Mother    Diabetes Mother    Congestive Heart Failure Mother    Diabetes Father    Heart attack Father    Colon cancer Neg Hx    Colon polyps Neg Hx     Social History   Tobacco Use   Smoking status: Every Day    Current packs/day: 1.50    Average packs/day: 1.5 packs/day for 28.0 years (42.0 ttl pk-yrs)    Types: Cigarettes   Smokeless tobacco: Never  Vaping Use   Vaping status: Never Used  Substance Use Topics   Alcohol use: Yes    Alcohol/week: 1.0 - 3.0 standard drink of alcohol    Types: 1 - 3 Standard drinks or equivalent per week    Comment: 1-2 beers a week.   Drug use: No    Medications: I have reviewed the patient's current medications.  Allergies  Allergen Reactions   Penicillins Anaphylaxis, Hives, Shortness Of Breath and Swelling    Pt given ceftriaxone  05/27/24  in ED and tolerated   Biphosphate Other (See Comments)    Aching joints   Heparin  Other (See Comments)    Rule out HIT   Fluticasone  Other (See Comments)    Headaches     ROS:  Pertinent items are noted in HPI.  Blood pressure (!) 154/63, pulse 64, temperature 98.2 F (36.8 C), temperature source Oral, resp. rate 14, height 5' 5 (1.651 m), weight 105 kg, last menstrual period 11/18/2012, SpO2 96%. Physical Exam Vitals reviewed.  Constitutional:      Appearance: Normal appearance.  HENT:     Head: Normocephalic and atraumatic.   Eyes:     Extraocular Movements: Extraocular movements intact.     Pupils: Pupils are equal, round, and reactive to light.   Neck:     Comments: Right IJ CVC in place, C/D/I Cardiovascular:     Rate and Rhythm: Normal rate.  Pulmonary:     Effort: Pulmonary  effort is normal.  Abdominal:     Comments: Abdomen soft, obese, no percussion tenderness, mild tenderness to palpation in the upper abdomen associated with a large ventral hernia which is soft and reducible, and large midline cicatrix noted   Musculoskeletal:        General: Normal range of motion.     Cervical back: Normal range of motion.   Skin:    General: Skin is warm and dry.   Neurological:     General: No focal deficit present.     Mental Status: She is alert and oriented to person, place, and time.   Psychiatric:        Mood and Affect: Mood normal.        Behavior: Behavior normal.     Results: Results for orders placed or performed during the hospital encounter of 06/07/24 (from the past 48 hours)  Glucose, capillary     Status: Abnormal   Collection Time: 06/13/24  5:39 PM  Result Value Ref Range   Glucose-Capillary 119 (H) 70 - 99 mg/dL    Comment: Glucose reference range applies only to samples taken after fasting for at least 8 hours.  Glucose, capillary     Status: None   Collection Time: 06/14/24 12:05 AM  Result Value Ref Range   Glucose-Capillary 98 70 - 99  mg/dL    Comment: Glucose reference range applies only to samples taken after fasting for at least 8 hours.  Basic metabolic panel with GFR     Status: Abnormal   Collection Time: 06/14/24  3:09 AM  Result Value Ref Range   Sodium 134 (L) 135 - 145 mmol/L   Potassium 4.4 3.5 - 5.1 mmol/L   Chloride 104 98 - 111 mmol/L   CO2 25 22 - 32 mmol/L   Glucose, Bld 82 70 - 99 mg/dL    Comment: Glucose reference range applies only to samples taken after fasting for at least 8 hours.   BUN 45 (H) 8 - 23 mg/dL   Creatinine, Ser 8.81 (H) 0.44 - 1.00 mg/dL   Calcium  8.5 (L) 8.9 - 10.3 mg/dL   GFR, Estimated 50 (L) >60 mL/min    Comment: (NOTE) Calculated using the CKD-EPI Creatinine Equation (2021)    Anion gap 5 5 - 15    Comment: Performed at [facility], [address], [place], [place] [address]  CBC     Status: Abnormal   Collection Time: 06/14/24  3:09 AM  Result Value Ref Range   WBC 18.1 (H) 4.0 - 10.5 K/uL   RBC 2.30 (L) 3.87 - 5.11 MIL/uL   Hemoglobin 7.3 (L) 12.0 - 15.0 g/dL   HCT 76.6 (L) 63.9 - 53.9 %   MCV 101.3 (H) 80.0 - 100.0 fL   MCH 31.7 26.0 - 34.0 pg   MCHC 31.3 30.0 - 36.0 g/dL   RDW 84.3 (H) 88.4 - 84.4 %   Platelets PLATELETS APPEAR DECREASED 150 - 400 K/uL    Comment: SPECIMEN CHECKED FOR CLOTS   nRBC 0.1 0.0 - 0.2 %    Comment: Performed at [facility], [address], [place], [place] [address]  Magnesium      Status: Abnormal   Collection Time: 06/14/24  3:09 AM  Result Value Ref Range   Magnesium  1.5 (L) 1.7 - 2.4 mg/dL    Comment: Performed at [facility], [address], [place], [place] [address]  VITAMIN D  25 Hydroxy (Vit-D Deficiency, Fractures)     Status: None  Collection Time: 06/14/24  3:09 AM  Result Value Ref Range   Vit D, 25-Hydroxy 66.71 30 - 100 ng/mL    Comment: (NOTE) Vitamin D  deficiency has been defined by the Institute of Medicine  and an Endocrine Society practice guideline as a level of serum 25-OH  vitamin D  less than 20 ng/mL  (1,2). The Endocrine Society went on to  further define vitamin D  insufficiency as a level between 21 and 29  ng/mL (2).  1. IOM (Institute of Medicine). 2010. Dietary reference intakes for  calcium  and D. [place]: [employer]. 2. Holick MF, Binkley [place], Bischoff-Ferrari HA, et al. Evaluation,  treatment, and prevention of vitamin D  deficiency: an Endocrine  Society clinical practice guideline, JCEM. 2011 Jul; 96(7): 1911-30.  Performed at [facility], [address] [place], [place] [address]   Glucose, capillary     Status: None   Collection Time: 06/14/24  5:28 AM  Result Value Ref Range   Glucose-Capillary 88 70 - 99 mg/dL    Comment: Glucose reference range applies only to samples taken after fasting for at least 8 hours.  Glucose, capillary     Status: None   Collection Time: 06/14/24 11:28 AM  Result Value Ref Range   Glucose-Capillary 88 70 - 99 mg/dL    Comment: Glucose reference range applies only to samples taken after fasting for at least 8 hours.  Glucose, capillary     Status: None   Collection Time: 06/14/24  5:47 PM  Result Value Ref Range   Glucose-Capillary 95 70 - 99 mg/dL    Comment: Glucose reference range applies only to samples taken after fasting for at least 8 hours.   Comment 1 Notify RN    Comment 2 Document in Chart   Hemoglobin and hematocrit, blood     Status: Abnormal   Collection Time: 06/14/24  7:41 PM  Result Value Ref Range   Hemoglobin 7.0 (L) 12.0 - 15.0 g/dL   HCT 77.7 (L) 63.9 - 53.9 %    Comment: Performed at [facility], [address] [place], [place] [address]  Hemoglobin and hematocrit, blood     Status: Abnormal   Collection Time: 06/14/24 11:47 PM  Result Value Ref Range   Hemoglobin 6.2 (LL) 12.0 - 15.0 g/dL    Comment: This critical result has verified and been called to [facility] by Rolin Glinda Jackson on 06 23 2025 at 0102, and has been read back.    HCT 19.8 (L) 36.0 - 46.0 %     Comment: Performed at [facility], [address], [place], [place] [address]  Basic metabolic panel with GFR     Status: Abnormal   Collection Time: 06/15/24  1:54 AM  Result Value Ref Range   Sodium 136 135 - 145 mmol/L   Potassium 4.7 3.5 - 5.1 mmol/L   Chloride 102 98 - 111 mmol/L   CO2 21 (L) 22 - 32 mmol/L   Glucose, Bld 112 (H) 70 - 99 mg/dL    Comment: Glucose reference range applies only to samples taken after fasting for at least 8 hours.   BUN 72 (H) 8 - 23 mg/dL   Creatinine, Ser 8.70 (H) 0.44 - 1.00 mg/dL   Calcium  8.9 8.9 - 10.3 mg/dL   GFR, Estimated 45 (L) >60 mL/min    Comment: (NOTE) Calculated using the CKD-EPI Creatinine Equation (2021)    Anion gap 13 5 - 15    Comment: Performed  at [facility], [address], [place], [place] [address]  Magnesium      Status: None   Collection Time: 06/15/24  1:54 AM  Result Value Ref Range   Magnesium  2.2 1.7 - 2.4 mg/dL    Comment: Performed at [facility], [address] [place], [place] [address]  Hemoglobin and hematocrit, blood     Status: Abnormal   Collection Time: 06/15/24  1:54 AM  Result Value Ref Range   Hemoglobin 6.3 (LL) 12.0 - 15.0 g/dL    Comment: This critical result has verified and been called to [facility] by Rolin Glinda Jackson on 06 23 2025 at 0239, and has been read back.    HCT 20.2 (L) 36.0 - 46.0 %    Comment: Performed at [facility], [address], [place], [place] [address]  Prepare RBC (crossmatch)     Status: None   Collection Time: 06/15/24  1:54 AM  Result Value Ref Range   Order Confirmation      ORDER PROCESSED BY BLOOD BANK Performed at [facility], [address] [place], [place] [address]   Type and screen [facility]     Status: None (Preliminary result)   Collection Time: 06/15/24  1:54 AM  Result Value Ref Range   ABO/RH(D) O POS    Antibody Screen NEG    Sample Expiration 06/18/2024,2359    Unit Number [id number]    Blood Component Type RED CELLS,LR     Unit division 00    Status of Unit ISSUED    Transfusion Status OK TO TRANSFUSE    Crossmatch Result Compatible    Unit Number [id number]    Blood Component Type RED CELLS,LR    Unit division 00    Status of Unit ISSUED    Transfusion Status OK TO TRANSFUSE    Crossmatch Result Compatible    Unit Number [id number]    Blood Component Type RED CELLS,LR    Unit division 00    Status of Unit ISSUED    Transfusion Status OK TO TRANSFUSE    Crossmatch Result      Compatible Performed at [facility], [address], [place], [place] [address]   Prepare platelet pheresis     Status: None (Preliminary result)   Collection Time: 06/15/24  1:54 AM  Result Value Ref Range   Unit Number [id number]    Blood Component Type PLTP1 PSORALEN TREATED    Unit division 00    Status of Unit ISSUED    Transfusion Status      OK TO TRANSFUSE Performed at [facility], [address], [place], [place] [address]   Prepare RBC (crossmatch)     Status: None   Collection Time: 06/15/24  8:48 AM  Result Value Ref Range   Order Confirmation      ORDER PROCESSED BY BLOOD BANK Performed at [facility], [address], [place], [place] [address]   CBC     Status: Abnormal   Collection Time: 06/15/24 11:42 AM  Result Value Ref Range   WBC 23.2 (H) 4.0 - 10.5 K/uL   RBC 3.00 (L) 3.87 - 5.11 MIL/uL   Hemoglobin 9.9 (L) 12.0 - 15.0 g/dL    Comment: REPEATED TO VERIFY POST TRANSFUSION SPECIMEN    HCT 28.9 (L) 36.0 - 46.0 %   MCV 96.3 80.0 - 100.0 fL   MCH 33.0 26.0 - 34.0 pg   MCHC 34.3 30.0 - 36.0 g/dL   RDW 83.8 (H) 88.4 -  15.5 %   Platelets 149 (L) 150 - 400 K/uL   nRBC 1.2 (H) 0.0 - 0.2 %    Comment: Performed at [facility], [address] [place], [place] [address]    CT ANGIO AO+BIFEM W & OR WO CONTRAST Result Date: 06/15/2024 CLINICAL DATA:  Hemorrhagic shock.  Evaluate for site of bleeding. EXAM: CT ANGIOGRAPHY OF ABDOMINAL AORTA WITH ILIOFEMORAL RUNOFF TECHNIQUE: Multidetector CT  imaging of the abdomen, pelvis and lower extremities was performed using the standard protocol during bolus administration of intravenous contrast. Multiplanar CT image reconstructions and MIPs were obtained to evaluate the vascular anatomy. RADIATION DOSE REDUCTION: This exam was performed according to the departmental dose-optimization program which includes automated exposure control, adjustment of the mA and/or kV according to patient size and/or use of iterative reconstruction technique. CONTRAST:  OMNIPAQUE  IOHEXOL  300 MG/ML  SOLN COMPARISON:  Prior renal ultrasound 06/08/2024 FINDINGS: VASCULAR Aorta: Normal caliber aorta without aneurysm, dissection, vasculitis or significant stenosis. Scattered calcified atherosclerotic plaque. Celiac: Patent without evidence of aneurysm, dissection, vasculitis or significant stenosis. SMA: Patent without evidence of aneurysm, dissection, vasculitis or significant stenosis. Renals: 2 right-sided renal arteries. Single left renal artery. Calcified atherosclerotic plaque bilaterally with at least moderate stenosis on the left and mild to moderate stenosis on the right. IMA: Atretic, likely chronic high-grade stenosis or occlusion of the origin. RIGHT Lower Extremity Inflow: Scattered atherosclerotic plaque without significant stenosis or occlusion. No aneurysm or dissection. Outflow: Similarly, mild scattered atherosclerotic plaque without significant stenosis or occlusion involving the common femoral, superficial femoral or profunda femoral arteries. The popliteal artery is widely patent. Runoff: High origin of the anterior tibial artery arising from the proximal P3 segment of the popliteal artery at the level of the knee joint. Patent 3 vessel runoff to the ankle. LEFT Lower Extremity Inflow: Mild scattered calcified atherosclerotic plaque without significant stenosis or occlusion. No aneurysm or dissection. Outflow: Common femoral, profunda femoral and superficial  femoral arteries are widely patent. No evidence of active bleeding. Runoff: High origin of the anterior tibial artery arising from the P2 segment of the popliteal artery at the level of the lower pole of the patella. Patent 3 vessel runoff to the ankle. No evidence of active bleeding. Veins: No focal venous abnormality. Review of the MIP images confirms the above findings. NON-VASCULAR Lower chest: Small bilateral pleural effusions and associated bibasilar atelectasis. Hepatobiliary: Normal hepatic contour and morphology. No discrete hepatic lesion. Surgical changes of cholecystectomy. No evidence of biliary ductal dilatation. Pancreas: Unremarkable. No pancreatic ductal dilatation or surrounding inflammatory changes. Spleen: Normal in size without focal abnormality. Adrenals/Urinary Tract: Unremarkable adrenal glands. No hydronephrosis or enhancing renal mass. 1.9 cm water  attenuation simple cyst exophytic from the interpolar right kidney. No imaging follow-up is recommended. 9 mm nonobstructing stone in the interpolar left renal collecting system. The ureters are unremarkable. A Foley catheter is in place. Stomach/Bowel: Limited evaluation for GI bleeding given scan technique. There is no pre contrast imaging and no delayed imaging. Scattered high attenuation material is present throughout the distal bowel consistent with prior barium administration. There is a small amount of high attenuation material layering dependently within the stomach as well as within the gastric antrum and duodenal bulb. However, there is intermediate attenuation density expanding the distal gastric antrum, duodenal bulb and proximal duodenum highly concerning for hematoma. Additionally, some high attenuation material is present within this presumed hematoma which does not appear to be layering dependently and is concerning for a focus of active  arterial extravasation. The gastric antral and proximal duodenal wall are thickened and there  is inflammatory stranding in the periduodenal fat. Additionally, several small bubbles are present immediately adjacent to the duodenal bulb. Findings suggest possible perforated peptic ulcer disease with probable bleeding. 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 Lymphatic: No suspicious lymphadenopathy. Reproductive: Status post hysterectomy. No adnexal masses. Other: Large midline ventral hernia containing the anterior body of the stomach, portions of the ascending colon and numerous loops of small bowel. Additionally, there is a moderate volume of free air scattered throughout abdomen. Musculoskeletal: Extensive multilevel degenerative disc disease. Comminuted distal left femoral fracture status post ORIF with an intramedullary nail. There are 2 proximal and 3 distal interlocking screws. The distal interlocking screws expected small volume hematoma surrounding the comminuted fracture site. No evidence of arterial injury or arterial bleeding. Multilevel degenerative disc disease is present. Bilateral lower lumbar facet arthropathy also noted. IMPRESSION: 1. Overall CT findings are highly concerning for perforated and bleeding peptic ulcer disease affecting the proximal duodenum, potentially stress related peptic ulcer disease. Additionally, while the study is not optimized to detect GI bleeding (no precontrast or delayed phase imaging) there appears to be hematoma within the gastric antrum and proximal duodenum with some scattered high attenuation material which may represent active extravasation versus ingested oral contrast (there is evidence of oral contrast elsewhere in the bowel). Hemorrhage is favored given the clinical history and the evidence of perforated ulceration. Scattered free air throughout the upper abdomen and within the ventral hernia sac. 2. Large ventral abdominal hernia containing the gastric body, a  portion of the ascending colon and multiple loops of small bowel as well as a moderate volume of free air. 3. No evidence of significant hemorrhage related to the comminuted distal femoral fracture or recent ORIF. There is a small amount of expected Peri fracture hematoma but no evidence of arterial injury or active bleeding. 4. Extensive scattered calcified atherosclerotic plaque as detailed above. 5. Bilateral high origins of the anterior tibial arteries. 6. Small bilateral pleural effusions and associated atelectasis. 7. Surgical changes of prior left hemicolectomy with Hartmann's pouch and diverting right lower quadrant colostomy. 8. Surgical changes of prior cholecystectomy. 9. Advanced multilevel degenerative disc disease and lower lumbar facet arthropathy. Critical Value/emergent results were called by telephone at the time of interpretation on 06/15/2024 at 10:40 am to provider [facility] , who verbally acknowledged these results. Electronically Signed   By: Wilkie Lent M.D.   On: 06/15/2024 10:52   DG Chest Port 1 View Result Date: 06/15/2024 EXAM: 1 VIEW XRAY OF THE CHEST 06/15/2024 06:03:00 AM COMPARISON: 1 view chest x-ray dated 06/07/2024. CLINICAL HISTORY: [id number] S/P PICC central line placement [id number]. PICC central line plcmt S/P PICC central line placement PICC central line plcmt FINDINGS: LUNGS AND PLEURA: Asymmetric left basilar airspace disease and effusion is again noted. HEART AND MEDIASTINUM: No acute abnormality of the cardiac and mediastinal silhouettes. BONES AND SOFT TISSUES: No acute osseous abnormality. LINES AND TUBES: A new right IJ line terminates at the cavoatrial junction. No pneumothorax is present. IMPRESSION: 1. New right IJ line terminates at the cavoatrial junction. 2. Asymmetric left basilar airspace disease and effusion, again noted. Electronically signed by: Lonni Necessary MD 06/15/2024 06:18 AM EDT RP Workstation: [id number]   DG Abd 1 View Result Date:  06/15/2024 CLINICAL DATA:  Unspecified abdominal pain EXAM: ABDOMEN - 1 VIEW COMPARISON:  None Available. FINDINGS: The bowel gas  pattern is normal. No radio-opaque calculi or other significant radiographic abnormality are seen. Mild gaseous distension of the stomach. Nonobstructive bowel gas pattern. Moderate stool burden. No free intraperitoneal gas. Rounded 6 mm calcification within the left paraspinal region may represent a renal calculus. Coarse calcification within the left lower quadrant is nonspecific, possibly the sequela remote trauma or inflammation. Numerous surgical clips seen within the epigastrium and left lower quadrant. Left femoral ORIF partially visualized. No acute bone abnormality. IMPRESSION: 1. Nonobstructive bowel gas pattern. Moderate stool burden. 2. Possible 6 mm left renal calculus. Electronically Signed   By: Dorethia Molt M.D.   On: 06/15/2024 03:03     Assessment & Plan:  KAYLEANA WAITES is a 68 y.o. female who was admitted with a left femur fracture and is status post retrograde left femoral nail on 6/20 who subsequently developed concern for a bleeding peptic ulcer.  Imaging is concerning for both a bleeding and perforated peptic ulcer.  Imaging and blood work evaluated by myself.  - I discussed with the patient the need for us  to proceed to the operating room for intervention given pneumoperitoneum with concern for perforated ulcer in addition to the findings concerning for bleeding ulcer.   -The risk and benefits of exploratory laparotomy, omental patch repair of perforated ulcer, possible control of bleeding ulcer, and all other indicated procedures were discussed including but not limited to bleeding, infection, injury to surrounding structures, need for additional procedures, poor wound healing, continued GI bleed, subsequent fistula/leak related to perforated ulcer, prolonged intubation, pulmonary complication, cardiac complication, and death.  After careful  consideration, Maurisa Tesmer Soderquist has decided to proceed with surgery.  -I also thoroughly discussed the surgery and risks with the patient's husband over the phone. -I discussed with both the patient and her husband that she will remain intubated after the surgery given her pulmonary disease, high risk for decompensation, and current vasopressor requirements -Discussed with Dr. Vicci that patient will need to be transferred to [facility] for critical care management postoperatively -NPO -IVF -Wean vasopressors as able, currently on Levo of 5 and vaso -Vanc, Zosyn, and fluconazole ordered  -PRN pain control and antiemetics -Hold all anticoagulation  All questions were answered to the satisfaction of the patient and family.  Note: Portions of this report may have been transcribed using voice recognition software. Every effort has been made to ensure accuracy; however, inadvertent computerized transcription errors may still be present.   -- Dorothyann Brittle, DO [facility] [address] [place], [place] [address] [phone] (office)

## 2024-06-15 NOTE — Progress Notes (Signed)
 [facility]  Spoke with the patient's husband on the phone.  I explained that she tolerated the procedure, but she is still intubated on vasopressor medications.  I explained that she had a perforation in her duodenum, but I did not note any active bleeding.  I repaired this perforation and placed a drain over top of this area.  She will be transferred down to [facility], ICU for further management.  It will also be beneficial to have her down there in the event she has any issues with repeat GI bleed, in which case IR could potentially intervene.  All questions were answered to his expressed satisfaction.  Plan: -Patient returned to ICU bed for at [facility].  Plan to be transferred down to [facility] for PCCM -Intubated and sedated (currently on propofol  drip with as needed fentanyl  pushes) -Wean vasopressors as able -Currently receiving an additional unit PRBCs.  Has received a total of 4 units PRBCs and 1 of platelets -JP drain care -NG to LIS -Would recommend starting TPN when clinically stable -Will need UGI in a week (or longer pending patient progression) to evaluate for leak from duodenal perforation -Updated Dr. Neda with PCCM.  Also notified Dr. Tanda with [facility] surgery about transfer   Dorothyann Brittle, DO [facility] [address] [place], [place] [address] [phone] (office)

## 2024-06-15 NOTE — Progress Notes (Signed)
 Received patient from 300 hypotensive, alert and oriented. Placed on trendelenburg position. Systolic low as 60's. MD informed and assess patient at bedside. Started Levo and titrated accordingly.1unit  PRBC and 1unit platelet ordered but transfusion delayed because of only 1 working IV access and currently used for Levo. Tried inserting peripheral access but failed. Central line inserted by ED doc. 1 unit PRBC transfused and started on 1 unit PLT. Endorsed to upcoming RN

## 2024-06-15 NOTE — H&P (Signed)
 NAME:  Christina Castaneda, MRN:  [id number], DOB:  1956/02/03, LOS: 8 ADMISSION DATE:  06/07/2024, CONSULTATION DATE:  06/15/24 REFERRING MD:  Vicci, CHIEF COMPLAINT:  pneumoperitoneum // shock   History of Present Illness:   68 yo F PMH perf sigmoid colon s/p hartmann, colostomy revision, HTN who was admitted to [facility] at [facility] 6/15 after presenting for a fall. Found to have a L distal femur fx related to the fall. On admission,  Labs significant for AKI w hyperK. She was medically optimized and then underwent retrograde L femoral nail.  On 6/23 overnight she had a drop in hgb to 6.2 -- associated dark ostomy output and increasing abdominal pain. Transfused. Subsequent hypotension requiring pressor initiation. Went for CTA which was concerning for perforated bleeding ulcer with air bubbles adjacent to duodenal bulb and layering of high attenuation material c/f active extrav.  CCS was consulted and pt went to OR for ex lap, on NE and vaso, having received 3 PRBC 1 plt  PCCM is consulted pre-op for transfer to [place] post operatively   Pertinent  Medical History  Prior sigmoid perf s/p hartmanns Colostomy revision HTN Hypothyroidism   Significant Hospital Events: Including procedures, antibiotic start and stop dates in addition to other pertinent events   6/15 admit to [facility] after fall days earlier, L femur fx and AKI 6/20 OR w ortho 6/23 hgb drop, shock -- found to have perforated bleeding duodenal ulcer.   Interim History / Subjective:   POD 0 ex lap graham patch repair   Intubated and sedate w/ prop On 4 mcg levo and 0.03 vaso  Objective    Blood pressure (!) 110/50, pulse 98, temperature 98.2 F (36.8 C), temperature source Oral, resp. rate 18, height 5' 5 (1.651 m), weight 105 kg, last menstrual period 11/18/2012, SpO2 94%.    FiO2 (%):  [21 %] 21 %   Intake/Output Summary (Last 24 hours) at 06/15/2024 1333 Last data filed at 06/15/2024 1136 Gross per 24 hour  Intake 3264.51 ml   Output 1750 ml  Net 1514.51 ml   Filed Weights   06/07/24 1700 06/12/24 1119 06/15/24 1209  Weight: 105 kg 105 kg 105 kg    Examination: General:  critically ill appearing on mech vent HEENT: MM pink/moist; ETT in place Neuro: sedated; perrl CV: s1s2, RRR, no m/r/g PULM:  dim clear BS bilaterally; on mech vent PRVC GI: abd mildly distended; abd incision dressing in place w/ JP drain; bowel sounds absent Extremities: warm/dry, trace edema     Resolved problem list   Assessment and Plan    Shock: hemorrhagic and septic  Pneumoperitoneum Perforated bleeding duodenal ulcer s/p ex lap and graham patch repair 6/23 Hx of Colostomy and colocutaneous fistula  ABLA Thrombocytopenia  P -post op per CCS -post op CBC, INR and transfuse per protocol -zosyn, vanc, fluconazole -PPI BID -scds for dvt ppx -NPO and ng on LIS -wean levo and vaso as abe for MAP > 65   Post op vent management AoC hypoxic resp failure -noct O2 at home COPD OSA on CPAP Tobacco use P -cxr on arrival -check abg -MV support -WUA/SBT as appropriate  -VAP, pulm hygiene -PAD protocol for RASS 0 to -1 -change breo ellipta  to brovana /pulmicort  while on vent; prn duoneb  Fall L femur fx s/p retrograde L femoral nail on 6/20 P -appreciate ortho recs 6/21 -PT when appropriate  AKI, improving Hyperkalemia: presumed 2/2 to ace inhibitor Hypomagnesemia Urinary retention  P -check cmp and  mag -cont foley -follow renal indices, UOP   Mild hypoglycemia P -cbg monitoring  Hypothyroidism P -synthroid  on hold while npo  HLD HTN P -statin on hold while npo -hold anti-htn meds while hypotensive  Stage 3 sacral pressure injury POA Plan: -woc consult   Best Practice (right click and Reselect all SmartList Selections daily)   Diet/type: NPO DVT prophylaxis SCD Pressure ulcer(s): present on admission  GI prophylaxis: PPI Lines: Central line and Arterial Line Foley:  Yes, and it is  still needed Code Status:  full code Last date of multidisciplinary goals of care discussion [6/23 updated spouse Randall over phone]  Labs   CBC: Recent Labs  Lab 06/11/24 0421 06/12/24 0426 06/13/24 0403 06/14/24 0309 06/14/24 1941 06/14/24 2347 06/15/24 0154 06/15/24 1142  WBC 10.3 14.9* 21.9* 18.1*  --   --   --  23.2*  HGB 9.8* 10.4* 8.6* 7.3* 7.0* 6.2* 6.3* 9.9*  HCT 31.7* 34.6* 28.7* 23.3* 22.2* 19.8* 20.2* 28.9*  MCV 103.9* 102.4* 102.9* 101.3*  --   --   --  96.3  PLT 140* 104* 64* PLATELETS APPEAR DECREASED  --   --   --  149*    Basic Metabolic Panel: Recent Labs  Lab 06/09/24 0442 06/10/24 0423 06/11/24 0421 06/11/24 0451 06/12/24 0426 06/12/24 0812 06/12/24 1036 06/13/24 0403 06/14/24 0309 06/15/24 0154  NA 138 139 137  --  137  --   --  134* 134* 136  K 4.8 4.9 4.9  --  5.6* 5.5* 5.1 5.5* 4.4 4.7  CL 107 109 108  --  109  --   --  106 104 102  CO2 22 24 24   --  25  --   --  24 25 21*  GLUCOSE 78 79 84  --  95  --   --  111* 82 112*  BUN 80* 71* 54*  --  43*  --   --  36* 45* 72*  CREATININE 2.73* 2.02* 1.66*  --  1.44*  --   --  1.27* 1.18* 1.29*  CALCIUM  8.2* 8.3* 8.6*  --  8.9  --   --  8.3* 8.5* 8.9  MG 1.8 1.6*  --  2.0  --   --   --   --  1.5* 2.2   GFR: Estimated Creatinine Clearance: 48.8 mL/min (A) (by C-G formula based on SCr of 1.29 mg/dL (H)). Recent Labs  Lab 06/12/24 0426 06/13/24 0403 06/14/24 0309 06/15/24 1142  WBC 14.9* 21.9* 18.1* 23.2*    Liver Function Tests: No results for input(s): AST, ALT, ALKPHOS, BILITOT, PROT, ALBUMIN in the last 168 hours. No results for input(s): LIPASE, AMYLASE in the last 168 hours. No results for input(s): AMMONIA in the last 168 hours.  ABG    Component Value Date/Time   PHART 7.547 (H) 04/17/2016 0600   PCO2ART 32.2 (L) 04/17/2016 0600   PO2ART 129 (H) 04/17/2016 0600   HCO3 27.2 06/07/2024 1736   TCO2 28.9 04/17/2016 0600   ACIDBASEDEF 3.0 (H) 04/07/2016 1211    O2SAT 33.5 06/07/2024 1736     Coagulation Profile: No results for input(s): INR, PROTIME in the last 168 hours.  Cardiac Enzymes: No results for input(s): CKTOTAL, CKMB, CKMBINDEX, TROPONINI in the last 168 hours.  HbA1C: No results found for: HGBA1C  CBG: Recent Labs  Lab 06/13/24 1739 06/14/24 0005 06/14/24 0528 06/14/24 1128 06/14/24 1747  GLUCAP 119* 98 88 88 95    Review of Systems:  Patient is sedate and/or intubated; therefore, history has been obtained from chart review.    Past Medical History:  She,  has a past medical history of Asthma, Atypical endometrial hyperplasia, COPD (chronic obstructive pulmonary disease) (HCC), Goiter, Gout, Heart murmur, Hypertension, Hypothyroidism, Vitamin B12 deficiency, and Yeast infection.   Surgical History:   Past Surgical History:  Procedure Laterality Date   ABDOMINAL WALL DEFECT REPAIR N/A 04/18/2016   Procedure: CLOSURE OF ABDOMEN;  Surgeon: Debby Shipper, MD;  Location: [facility] OR;  Service: General;  Laterality: N/A;   APPENDECTOMY     APPLICATION OF WOUND VAC N/A 04/02/2016   Procedure: application of wound vac+;  Surgeon: Camellia Blush, MD;  Location: [facility] OR;  Service: General;  Laterality: N/A;   CHOLECYSTECTOMY  2009   gallstone removed   COLOSTOMY N/A 03/30/2016   Procedure: COLOSTOMY;  Surgeon: Donnice Bury, MD;  Location: [facility] OR;  Service: General;  Laterality: N/A;   COLOSTOMY REVISION N/A 03/28/2016   Procedure: COLOSTOMY REVISION;  Surgeon: Lynwood Pina, MD;  Location: [facility] OR;  Service: General;  Laterality: N/A;   COLOSTOMY REVISION N/A 03/30/2016   Procedure: COLON RESECTION LEFT;  Surgeon: Donnice Bury, MD;  Location: [facility] OR;  Service: General;  Laterality: N/A;   LAPAROTOMY N/A 03/28/2016   Procedure: EXPLORATORY LAPAROTOMY;  Surgeon: Lynwood Pina, MD;  Location: [facility] OR;  Service: General;  Laterality: N/A;   LAPAROTOMY N/A 04/02/2016   Procedure: Re-exploration of open abdomen, application of  abdominal wound vac;  Surgeon: Camellia Blush, MD;  Location: [facility] OR;  Service: General;  Laterality: N/A;   LAPAROTOMY N/A 04/04/2016   Procedure: EXPLORATORY LAPAROTOMY, PLACEMENT OF ABRA ABDOMINAL WALL CLOSURE SET ;  Surgeon: Camellia Blush, MD;  Location: [facility] OR;  Service: General;  Laterality: N/A;   LAPAROTOMY N/A 04/18/2016   Procedure: EXPLORATORY LAPAROTOMY;  Surgeon: Debby Shipper, MD;  Location: [facility] OR;  Service: General;  Laterality: N/A;   NECK SURGERY  1994   repair disk   OMENTECTOMY N/A 03/28/2016   Procedure: OMENTECTOMY;  Surgeon: Lynwood Pina, MD;  Location: [facility] OR;  Service: General;  Laterality: N/A;   ROBOTIC ASSISTED TOTAL HYSTERECTOMY WITH BILATERAL SALPINGO OOPHERECTOMY  11/18/2012   Procedure: ROBOTIC ASSISTED TOTAL HYSTERECTOMY WITH BILATERAL SALPINGO OOPHORECTOMY;  Surgeon: Elenore A. Dodie, MD;  Location: [facility] ORS;  Service: Gynecology;  Laterality: N/A;   TONSILLECTOMY     as a child   VACUUM ASSISTED CLOSURE CHANGE N/A 03/30/2016   Procedure: ABDOMINAL VAC CHANGE;  Surgeon: Donnice Bury, MD;  Location: [facility] OR;  Service: General;  Laterality: N/A;   WOUND DEBRIDEMENT N/A 03/28/2016   Procedure: DEBRIDEMENT WOUND;  Surgeon: Lynwood Pina, MD;  Location: [facility] OR;  Service: General;  Laterality: N/A;     Social History:   reports that she has been smoking cigarettes. She has a 42 pack-year smoking history. She has never used smokeless tobacco. She reports current alcohol use of about 1.0 - 3.0 standard drink of alcohol per week. She reports that she does not use drugs.   Family History:  Her family history includes COPD in her mother; Congestive Heart Failure in her mother; Diabetes in her father and mother; Heart attack in her father. There is no history of Colon cancer or Colon polyps.   Allergies Allergies  Allergen Reactions   Penicillins Anaphylaxis, Hives, Shortness Of Breath and Swelling    Pt given ceftriaxone  05/27/24 in ED and tolerated   Biphosphate Other (See Comments)     Aching joints  Heparin  Other (See Comments)    Rule out HIT   Fluticasone  Other (See Comments)    Headaches     Home Medications  Prior to Admission medications   Medication Sig Start Date End Date Taking? Authorizing Provider  acetaminophen  (TYLENOL ) 500 MG tablet Take 1,000 mg by mouth every 6 (six) hours.   Yes [provider]  albuterol  (VENTOLIN  HFA) 108 (90 Base) MCG/ACT inhaler Inhale 2 puffs into the lungs every 6 (six) hours as needed. Wheezing and shortness of breath 05/31/24 07/30/24 Yes Shahmehdi, Seyed A, MD  allopurinol  (ZYLOPRIM ) 300 MG tablet Take 300 mg by mouth daily.   Yes [provider]  Cyanocobalamin (VITAMIN B-12 CR PO) Take 1 tablet by mouth daily.   Yes [provider]  Ergocalciferol  50 MCG (2000 UT) TABS Take 2,000 Units by mouth daily.   Yes [provider]  fluticasone -salmeterol (ADVAIR) 500-50 MCG/ACT AEPB Inhale 1 puff into the lungs in the morning and at bedtime. 05/31/24 06/30/24 Yes Shahmehdi, Adriana LABOR, MD  gabapentin  (NEURONTIN ) 300 MG capsule Take 1-2 capsules by mouth at bedtime. 02/27/21  Yes [provider]  levothyroxine  (SYNTHROID ) 137 MCG tablet Take 137 mcg by mouth daily.   Yes [provider]  nystatin  (MYCOSTATIN /NYSTOP ) powder Apply topically 3 (three) times daily. 05/31/24  Yes Shahmehdi, Seyed A, MD  rosuvastatin  (CRESTOR ) 10 MG tablet Take 10 mg by mouth daily.   Yes [provider]  senna-docusate (SENOKOT-S) 8.6-50 MG tablet Take 2 tablets by mouth at bedtime. 05/31/24 06/30/24 Yes Shahmehdi, Adriana LABOR, MD     Critical care time: 50 minutes     JD Emilio RIGGERS [facility] 06/15/2024, 7:32 PM  Please see [email or web address] for pager details.  From 7A-7P if no response, please call [phone]. After hours, please call ELink [phone].

## 2024-06-15 NOTE — Progress Notes (Addendum)
 Notified by RN for nausea, abd pain. Drop in Hb to 6.2.  Evaluated at bedside, she c/o severe central abd pain over past few hr. Has been having ostomy output, although it is dark / melenic. Leg feels OK. VS tachy into 120s, BP overall downtrending but wnl. Exam with melenic output from ostomy, hyperresonance, abd tenderness without peritoneal sign. Her immobilizer was removed and surgical dressings are clean and dry. There is no obvious hematoma, compartments are soft. Lab Hb 6.2, PLT not reported but appear decreased, previously in 60s. Has been receiving enoxaparin  for DVT ppx. Suspect that she is having bleeding likely UGIB based on ostomy output, also at risk of post surgical bleed. Will transfuse 1 U RBC, and 1 U PLT. Add PPI IV for suspected upper GI bleed. NPO except sips, will need GI consult in AM for consideration of endoscopy. Stopped her enoxaparin  for now. Replaced with SCD.   If VS worsening can escalate her to stepdown   Updated developed hypotension 70/40s and bolusing fluids, blood had not been obtained yet. initial escalation to stepdown but with persistent hypotension escalated to ICU level of care. She ultimately required initiation of Levophed  peripherally and has one access site. So we are awaiting establishment of second access point to start transfusion, RN staff working on this. Have discussed with ED to request central line if unable   Have also discussed case with GI Dr. Cinderella, agrees with initial stabilization, CTA to include A/P. He will have Gi team see urgently this morning. We both agree not stable enough for endoscopy at the moment.   She has 1 U blood running, and I have called the blood bank for emergent release with 1 U PLT (previously ordered) as well as 2 additional units of blood. She is currently on max peripheral levophed  at 15 mcg and thankfully we are getting central access with assistance from Dr. Theadore.   If we can stabilize sufficiently and obtain the CTA then  likely would be beneficial to be transferred to [facility] for in house intensive care and have additional access to blood products   Update:  She is on Levophed  25 mcg, will add Vasopressin. After this is started we will take for CTA.   Dorn Dawson, MD  [facility]   CRITICAL CARE Performed by: Dorn Dawson   Total critical care time: 45 minutes  Critical care time was exclusive of separately billable procedures and treating other patients.  Critical care was necessary to treat or prevent imminent or life-threatening deterioration.  Critical care was time spent personally by me on the following activities: development of treatment plan with patient and/or surrogate as well as nursing, discussions with consultants, evaluation of patient's response to treatment, examination of patient, obtaining history from patient or surrogate, ordering and performing treatments and interventions, ordering and review of laboratory studies, ordering and review of radiographic studies, pulse oximetry and re-evaluation of patient's condition

## 2024-06-15 NOTE — Anesthesia Procedure Notes (Signed)
 Procedure Name: Intubation Date/Time: 06/15/2024 1:57 PM  Performed by: Elaine Delon CROME, CRNAPre-anesthesia Checklist: Patient identified, Emergency Drugs available, Suction available and Patient being monitored Patient Re-evaluated:Patient Re-evaluated prior to induction Oxygen Delivery Method: Circle system utilized Preoxygenation: Pre-oxygenation with 100% oxygen Induction Type: IV induction, Rapid sequence and Cricoid Pressure applied Laryngoscope Size: Mac and 3 Grade View: Grade I Tube size: 8.0 mm Number of attempts: 1 Airway Equipment and Method: Stylet Placement Confirmation: ETT inserted through vocal cords under direct vision, positive ETCO2 and breath sounds checked- equal and bilateral Secured at: 21 cm Tube secured with: Tape Dental Injury: Teeth and Oropharynx as per pre-operative assessment

## 2024-06-15 NOTE — Progress Notes (Addendum)
 eLink Physician-Brief Progress Note Patient Name: Christina Castaneda DOB: December 17, 1956 MRN: [id number]   Date of Service  06/15/2024  HPI/Events of Note  eICU Brief new admit note: 68 yr old female transferred to ICU for Melena from ostomy, symptomatic with low BP.  Hg 6.3 S/p fall admit from 15 th for left femur fracture/AKI. S/p Femoral nailing on 20th.   Data: Reviewed Last VBG from 5:30 pm 7.43.  Cr from AM today 1.29. Thrombocytopenia from 21 st. 2017 ECHO EF normal.  Camera: Trendelenburg. Starting PRBC stat. On levophed . MAP soft. HR over 90. On nasal o2. Morbidly obese.   eICU Interventions  - continue current care plan of PRBC. Levophed . Going for CT angio of Left lower extremity, abdomen for any active bleeding.  -would advice adding CTA chest also to r/o PE, at risk.   Asp precautions. Follow platelet count/INR-PTT also.    Secure chat done with admitting doc for add on CTA chest .      Intervention Category Major Interventions: Hypotension - evaluation and management;Other: Evaluation Type: New Patient Evaluation  Christina Castaneda 06/15/2024, 4:59 AM

## 2024-06-15 NOTE — Progress Notes (Addendum)
 Pharmacy Antibiotic Note  Christina Castaneda is a 68 y.o. female admitted on 06/07/2024 with an intra-abdominal infection.  Pharmacy has been consulted for Zosyn, vancomycin , and fluconazole dosing. Concern for perforated and bleeding peptic ulcer affecting the proximal duodenum. Currently afebrile and WBC elevated at 23. Scr stable at 1.2.   Surgery is planned for this afternoon. Possible transfer to [facility] post-op.   Plan: Vancomycin  1000 mg IV every 24 hours.  Goal trough 10-15 mcg/mL. Estimated AUC is 548.8 using a SCr of 1.29 Zosyn 3.375g IV q8h (4 hour infusion). Fluconazole 400 mg q24h.  Antibiotics to be given pre-op.   Height: 5' 5 (165.1 cm) Weight: 105 kg (231 lb 7.7 oz) IBW/kg (Calculated) : 57  Temp (24hrs), Avg:97.8 F (36.6 C), Min:97.5 F (36.4 C), Max:98.3 F (36.8 C)  Recent Labs  Lab 06/11/24 0421 06/12/24 0426 06/13/24 0403 06/14/24 0309 06/15/24 0154 06/15/24 1142  WBC 10.3 14.9* 21.9* 18.1*  --  23.2*  CREATININE 1.66* 1.44* 1.27* 1.18* 1.29*  --     Estimated Creatinine Clearance: 48.8 mL/min (A) (by C-G formula based on SCr of 1.29 mg/dL (H)).    Allergies  Allergen Reactions   Penicillins Anaphylaxis, Hives, Shortness Of Breath and Swelling    Pt given ceftriaxone  05/27/24 in ED and tolerated   Biphosphate Other (See Comments)    Aching joints   Heparin  Other (See Comments)    Rule out HIT   Fluticasone  Other (See Comments)    Headaches    Antimicrobials this admission: Zosyn 6/23 Vancomycin  6/23 >> Fluconazole 6/23 >>  Microbiology results: MRSA PCR: (-) on 6/18  Thank you for allowing pharmacy to be a part of this patient's care.  Norleen Moles 06/15/2024 12:57 PM

## 2024-06-15 NOTE — Progress Notes (Signed)
 PT Cancellation Note  Patient Details Name: Christina Castaneda MRN: [id number] DOB: 1956/09/19   Cancelled Treatment:    Reason Eval/Treat Not Completed: Medical issues which prohibited therapy. Patient transferred to a higher level of care and will need new PT consult to resume therapy when patient is medically stable.  Thank you.    10:40 AM, 06/15/24 Lynwood Music, MPT Physical Therapist with [facility] [phone] office [phone] mobile phone

## 2024-06-15 NOTE — Anesthesia Procedure Notes (Signed)
 Arterial Line Insertion Start/End6/23/2025 2:05 PM, 06/15/2024 2:07 PM Performed by: Elaine Delon CROME, CRNA, Jenny Nevelyn BRAVO, RN, CRNA  Patient location: OR. Preanesthetic checklist: patient identified, IV checked, site marked, risks and benefits discussed, surgical consent, monitors and equipment checked, pre-op evaluation, timeout performed and anesthesia consent Patient sedated Right, radial was placed Catheter size: 20 G Hand hygiene performed  and maximum sterile barriers used  Allen's test indicative of satisfactory collateral circulation Attempts: 1 Procedure performed without using ultrasound guided technique. Following insertion, dressing applied and Biopatch. Post procedure assessment: normal  Patient tolerated the procedure well with no immediate complications.

## 2024-06-15 NOTE — Consult Note (Addendum)
 WOC Nurse Consult Note: patient admitted to [facility] on 6/15; on flowsheet charted as having a Stage 3 pressure Injury sacrum WOC not consulted, no photo documentation of wound at that time  Reason for Consult: sacral pressure injury  Wound type:Unstageable  Pressure Injury  sacrum  Pressure Injury POA: Yes, worsened from initial  description  Measurement: see nursing flowsheet  Wound bed:100%gray necrotic tissue in wound bed, surrounding tissue with yellow necrotic slough  Drainage (amount, consistency, odor) see nursing flowsheet  Periwound: mild erythema, some scattered areas of pink moist  Dressing procedure/placement/frequency: Cleanse sacral wound with Vashe wound cleanser Soila [phone]) do not rinse and allow to air dry. Apply Medihoney to wound bed and surrounding yellow slough daily, fill in wound with dry gauze and secure with ABD pad or silicone foam whichever is preferred.   Patient should remain on a low air loss mattress throughout hospitalization for pressure redistribution and moisture management.     POC discussed with bedside nurse.  WOC team will not follow. Re-consult if further needs arise.   Thank you,    Powell Bar MSN, RN-BC, [employer]

## 2024-06-15 NOTE — Brief Op Note (Signed)
 06/07/2024 - 06/15/2024  3:43 PM  PATIENT:  Christina Castaneda  68 y.o. female  PRE-OPERATIVE DIAGNOSIS:  pneumoperitoneum, perforated ulcer  POST-OPERATIVE DIAGNOSIS:  pneumoperitoneum, perforated duodenal ulcer; intra-abdominal adhesions.  PROCEDURE:  Procedure(s): LAPAROTOMY, EXPLORATORY OMENTAL GRAHAM PATCH REPAIR. LYSIS OF ADHEASIONS. (N/A)  SURGEON:  Surgeons and Role:    * Gabbi Whetstone, Dorothyann LABOR, DO - Primary    * Bridges, Manuelita BROCKS, MD - Assisting  PHYSICIAN ASSISTANT: Manuelita Pander, MD  ANESTHESIA:   general  EBL:  50 cc  BLOOD ADMINISTERED:none  DRAINS: JP drain overlying omental graham patch repair  LOCAL MEDICATIONS USED:  MARCAINE      SPECIMEN:  Source of Specimen:  culture of peritoneal fluid  DISPOSITION OF SPECIMEN:  Microbiology  COUNTS:  YES  DICTATION: .Note written in EPIC  PLAN OF CARE: Patient to return to [facility] ICU, plan for transfer to [facility] ICU  PATIENT DISPOSITION:  ICU - intubated and critically ill.   Delay start of Pharmacological VTE agent (>24hrs) due to surgical blood loss or risk of bleeding: yes  Dorothyann Brittle, DO [facility] [address] [place], [place] [address] [phone] (office)

## 2024-06-15 NOTE — Op Note (Signed)
 [facility] Operative Note  06/15/24  Preoperative Diagnosis: Pneumoperitoneum, perforated ulcer   Postoperative Diagnosis: Pneumoperitoneum, perforated duodenal ulcer, intra-abdominal adhesions   Procedure(s) Performed: Exploratory laparotomy, omental Graham patch repair of the duodenal ulcer, lysis of adhesions   Surgeon: Dorothyann Brittle, DO    Assistants: Manuelita Pander, MD   Anesthesia: General endotracheal   Anesthesiologist: Kendell Yvonna PARAS, MD    Specimens: Culture obtained peritoneal fluid   Estimated Blood Loss: 50 cc   Blood Replacement: 1 unit PRBCs   Complications: None   Wound Class: Dirty/infected   Operative Indications: Patient is a 68 year old female who was admitted to the hospital with a left femur fracture and underwent repair of this fracture on 6/20.  Overnight she subsequently became hemodynamically unstable and was noted to have a low hemoglobin and presumed GI bleed.  She underwent a CTA of the abdomen which demonstrated concern for perforated and bleeding peptic ulcer affecting the proximal duodenum without active extravasation being noted.  Discussed need for surgical intervention given concern for perforated ulcer with pneumoperitoneum.  Patient is agreeable to surgery.  All risks and benefits of performing this procedure were discussed with the patient including pain, infection, bleeding, damage to the surrounding structures, need for more procedures or surgery, poor wound healing, continued GI bleed, subsequent fistula/leak related to perforated ulcer, prolonged intubation, pulmonary complication, cardiac complication, and death. The patient voiced understanding of the procedure, all questions were sought and answered, and consent was obtained.  Findings: -Significant intra-abdominal adhesions associated with the patient's large ventral hernia, right sided colostomy, falciform, stomach, and anterior abdominal wall -Perforated  anterior and proximal duodenal ulcer without evidence of active extravasation to suggest continued GI bleed -Secured omental Graham patch in place at completion of procedure with overlying JP drain -Hemostasis noted at the completion of the case   Procedure: The patient was taken to the operating room and placed supine. General endotracheal anesthesia was induced. Intravenous antibiotics were administered per protocol.  An nasogastric tube positioned to decompress the stomach. The abdomen was prepared and draped in the usual sterile fashion.  A time-out was completed verifying correct patient, procedure, site, positioning, and implant(s) and/or special equipment prior to beginning this procedure.  A vertical midline incision was made from the xiphoid down to just above the top of the ventral hernia sac.  This was deepened through the subcutaneous tissues, and hemostasis was achieved with electrocautery.  The linea alba was identified and incised, and the peritoneal cavity was entered.  The fascia was opened down towards the ventral hernia sac, but at least 1 cm of intact fascia was left at the inferior aspect of the incision to prevent us  from entering the ventral hernia.    Upon entering the abdomen (organ space), I encountered purulent ascites.  Culture was obtained.  The abdomen was explored, and there were adhesions noted throughout the upper abdomen involving the anterior abdominal wall, the falciform ligament, the right sided colostomy within the ventral hernia, and the stomach.  These adhesions were taken down with a combination of blunt dissection, sharp dissection, and electrocautery with LigaSure.  Once the stomach was able to be reduced from the ventral hernia sac and released from surrounding adhesions, the stomach and proximal duodenum were inspected.  The proximal duodenum was adherent to the under side of the liver, and this was taken down carefully with blunt dissection.  There was a 1.5 cm  anterior, proximal duodenal perforation identified.  There were old clots evacuated from  the perforation without any active, bright red blood noted.  Patient also had dark old blood in NG tube canister.    Given the patient's clinical status with being on multiple pressors and her significant intra-abdominal adhesions, decision was made to proceed with plication/Graham patch repair.  A tongue of omentum was then created that would easily secure over the perforation site without tension.  Interrupted 3-0 silk sutures were placed through duodenal tissue in such a fashion to span the perforation.  These were tied to close down the perforation.  The tongue of omentum was then brought up and placed over the perforation and secured in place by tying the previously placed sutures.  Care was taken to avoid excess tension.  Hemostasis was checked.  The position of the NG tube was verified.  The upper abdomen was then irrigated with warm saline.  A 10 [place] JP drain was placed over top of the repair stitch to the abdominal wall with 3-0 nylon.    The fascia was closed with a running 0 PDS from the superior and inferior aspects.  The subcutaneous tissues were then irrigated with warm saline.  Hemostasis was again noted.  Marcaine  was instilled at the incision site.  The skin was closed with skin staples.  A honeycomb dressing was applied and drain sponge was placed.  Dr. Kallie was assisting throughout the procedure and was present for the critical portions of the case, including lysis of adhesions, identification of duodenal perforation and omental graham patch repair of perforation.   Final inspection revealed acceptable hemostasis. All counts were correct at the end of the case. The patient was kept intubated postoperatively given her COPD and concern for further decompensation.  The patient returned to her ICU bed at [facility] with plan for transfer down to [facility], ICU for PCCM evaluation and management.    Dorothyann Brittle, DO  [facility] [address] [place], [place] [address] [phone] (office)

## 2024-06-15 NOTE — Progress Notes (Addendum)
 Pharmacy Heparin  Induced Thrombocytopenia (HIT) Note:  Christina Castaneda is an 68 y.o. 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 Heparin  was started 6/15 for DVT px, and baseline platelets were 289.   HIT labs were ordered on 6/23 . Heparin  sq px 50000 units q8h started 6/15 - 6/19 at 2100.  Surgery 6/20 intramedullary rod placed in the femur Lovenox  40mg  sq daily given post op 6/21 and 6/22 Noted platelets dropped to 104(6/20), then 64(6/21)and 6/22 noted platelets as decreased( no #).   Auto-populate labs: No results found for: HEPINDPLTAB, SRALOWDOSEHP, SRAHIGHDOSEH   CALCULATE SCORE:  4Ts (see the HIT Algorithm) Score  Thrombocytopenia 2  Timing 2  Thrombosis 0  Other causes of thrombocytopenia 2  Total 6    Recommendations (A or B) are based on available lab results (HIT antibody and/or SRA) and the HIT algorithm    A. HIT antibody result available  Possible HIT   Order SRA:  No, waiting on HIT ab available and if + Discontinue heparin  / LMWH:  Yes Initiate alternative anticoagulation:  No Document heparin  allergy:  Yes  B. SRA result availability  SRA not available  Name of MD Contacted: Dr. Vicci  Plan (Discussed with provider) Labs ordered:  SRA determination once HIT ab available  Heparin  allergy:  Heparin  allergy documented or updated. Anticoagulation plans:  Discontinue heparin  / LMWH F/U HIT antibody D/T high risk of HIT with 4Ts score of 6    Embrie Mikkelsen, BS Pharm D, BCPS Clinical Pharmacist 06/15/2024, 10:23 AM

## 2024-06-15 NOTE — Progress Notes (Signed)
 [facility] at bedside. Report called to [facility] for tx to 71M. Pt remains intubated, with levo at 2 , vaso 0.03, and prop @ 20 transfusing via Right internal jugular CVC. R art line in place and zeroed. Foley cath in place and knee immobilizer to left leg.

## 2024-06-15 NOTE — ED Provider Notes (Signed)
 I was called out to the ICU for assistance with critically ill patient.  Profound shock, concern for hypovolemia, blood loss.  Very poor peripheral access.  Mentating fairly well, feeling nauseated, conversant.  No need for emergent airway but central line placed as described below.  Central Line  Date/Time: 06/15/2024 6:26 AM  Performed by: Theadore Ozell HERO, MD Authorized by: Theadore Ozell HERO, MD   Consent:    Consent obtained:  Verbal and emergent situation   Consent given by:  Patient   Risks, benefits, and alternatives were discussed: yes     Risks discussed:  Pneumothorax, infection, incorrect placement, arterial puncture and bleeding Universal protocol:    Procedure explained and questions answered to patient or proxy's satisfaction: yes     Immediately prior to procedure, a time out was called: yes     Patient identity confirmed:  Verbally with patient Pre-procedure details:    Indication(s): central venous access     Hand hygiene: Hand hygiene performed prior to insertion     Sterile barrier technique: All elements of maximal sterile technique followed     Skin preparation:  Chlorhexidine    Skin preparation agent: Skin preparation agent completely dried prior to procedure   Sedation:    Sedation type:  None Anesthesia:    Anesthesia method:  Local infiltration   Local anesthetic:  Lidocaine  1% w/o epi Procedure details:    Location:  R internal jugular   Patient position:  Trendelenburg   Procedural supplies:  Triple lumen   Catheter size:  7 Fr   Landmarks identified: yes     Ultrasound guidance: yes     Ultrasound guidance timing: real time     Sterile ultrasound techniques: Sterile gel and sterile probe covers were used     Number of attempts:  1   Successful placement: yes   Post-procedure details:    Post-procedure:  Dressing applied and line sutured   Assessment:  Blood return through all ports, free fluid flow, no pneumothorax on x-ray and placement verified by  x-ray   Procedure completion:  Tolerated well, no immediate complications     Theadore Ozell HERO, MD 06/15/24 [phone]

## 2024-06-15 NOTE — Transfer of Care (Signed)
 Immediate Anesthesia Transfer of Care Note  Patient: Christina Castaneda  Procedure(s) Performed: LAPAROTOMY, EXPLORATORY OMENTAL GRAHAM PATCH REPAIR. LYSIS OF ADHEASIONS. (Abdomen)  Patient Location: ICU  Anesthesia Type:General  Level of Consciousness: sedated and Patient remains intubated per anesthesia plan  Airway & Oxygen Therapy: Patient Spontanous Breathing, Patient remains intubated per anesthesia plan, and Patient placed on Ventilator (see vital sign flow sheet for setting)  Post-op Assessment: Report given to RN and Post -op Vital signs reviewed and stable  Post vital signs: Reviewed and stable  Last Vitals:  Vitals Value Taken Time  BP 185/69   Temp    Pulse 75 06/15/24 16:11  Resp 19 06/15/24 16:11  SpO2 100 % 06/15/24 16:11  Vitals shown include unfiled device data.  Last Pain:  Vitals:   06/15/24 1209  TempSrc: Oral  PainSc: 0-No pain      Patients Stated Pain Goal: 5 (06/12/24 1528)  Complications: No notable events documented.

## 2024-06-16 ENCOUNTER — Encounter (HOSPITAL_COMMUNITY): Payer: Self-pay

## 2024-06-16 DIAGNOSIS — K276 Chronic or unspecified peptic ulcer, site unspecified, with both hemorrhage and perforation: Secondary | ICD-10-CM | POA: Diagnosis not present

## 2024-06-16 DIAGNOSIS — A419 Sepsis, unspecified organism: Secondary | ICD-10-CM | POA: Diagnosis not present

## 2024-06-16 DIAGNOSIS — J9621 Acute and chronic respiratory failure with hypoxia: Secondary | ICD-10-CM | POA: Diagnosis not present

## 2024-06-16 DIAGNOSIS — R6521 Severe sepsis with septic shock: Secondary | ICD-10-CM | POA: Diagnosis not present

## 2024-06-16 DIAGNOSIS — K660 Peritoneal adhesions (postprocedural) (postinfection): Secondary | ICD-10-CM

## 2024-06-16 LAB — CBC
HCT: 25.2 % — ABNORMAL LOW (ref 36.0–46.0)
Hemoglobin: 8.2 g/dL — ABNORMAL LOW (ref 12.0–15.0)
MCH: 31.2 pg (ref 26.0–34.0)
MCHC: 32.5 g/dL (ref 30.0–36.0)
MCV: 95.8 fL (ref 80.0–100.0)
Platelets: 152 10*3/uL (ref 150–400)
RBC: 2.63 MIL/uL — ABNORMAL LOW (ref 3.87–5.11)
RDW: 16.3 % — ABNORMAL HIGH (ref 11.5–15.5)
WBC: 25.1 10*3/uL — ABNORMAL HIGH (ref 4.0–10.5)
nRBC: 0.8 % — ABNORMAL HIGH (ref 0.0–0.2)

## 2024-06-16 LAB — BPAM PLATELET PHERESIS
Blood Product Expiration Date: 202506232359
ISSUE DATE / TIME: 202506230631
Unit Type and Rh: 5100

## 2024-06-16 LAB — HEMOGLOBIN AND HEMATOCRIT, BLOOD
HCT: 19.8 % — ABNORMAL LOW (ref 36.0–46.0)
HCT: 20.2 % — ABNORMAL LOW (ref 36.0–46.0)
Hemoglobin: 6.2 g/dL — CL (ref 12.0–15.0)
Hemoglobin: 6.3 g/dL — CL (ref 12.0–15.0)

## 2024-06-16 LAB — PREPARE PLATELET PHERESIS: Unit division: 0

## 2024-06-16 LAB — HEPARIN INDUCED PLATELET AB (HIT ANTIBODY): Heparin Induced Plt Ab: 0.04 {OD_unit} (ref 0.000–0.400)

## 2024-06-16 LAB — GLUCOSE, CAPILLARY
Glucose-Capillary: 100 mg/dL — ABNORMAL HIGH (ref 70–99)
Glucose-Capillary: 101 mg/dL — ABNORMAL HIGH (ref 70–99)
Glucose-Capillary: 106 mg/dL — ABNORMAL HIGH (ref 70–99)
Glucose-Capillary: 73 mg/dL (ref 70–99)
Glucose-Capillary: 78 mg/dL (ref 70–99)

## 2024-06-16 LAB — COMPREHENSIVE METABOLIC PANEL WITH GFR
ALT: 36 U/L (ref 0–44)
AST: 70 U/L — ABNORMAL HIGH (ref 15–41)
Albumin: <1.5 g/dL — ABNORMAL LOW (ref 3.5–5.0)
Alkaline Phosphatase: 41 U/L (ref 38–126)
Anion gap: 5 (ref 5–15)
BUN: 84 mg/dL — ABNORMAL HIGH (ref 8–23)
CO2: 22 mmol/L (ref 22–32)
Calcium: 7.5 mg/dL — ABNORMAL LOW (ref 8.9–10.3)
Chloride: 107 mmol/L (ref 98–111)
Creatinine, Ser: 1.56 mg/dL — ABNORMAL HIGH (ref 0.44–1.00)
GFR, Estimated: 36 mL/min — ABNORMAL LOW (ref >60)
Glucose, Bld: 118 mg/dL — ABNORMAL HIGH (ref 70–99)
Potassium: 4.6 mmol/L (ref 3.5–5.1)
Sodium: 134 mmol/L — ABNORMAL LOW (ref 135–145)
Total Bilirubin: 0.9 mg/dL (ref 0.0–1.2)
Total Protein: 3.9 g/dL — ABNORMAL LOW (ref 6.5–8.1)

## 2024-06-16 LAB — TRIGLYCERIDES: Triglycerides: 120 mg/dL (ref <150)

## 2024-06-16 LAB — MAGNESIUM: Magnesium: 1.9 mg/dL (ref 1.7–2.4)

## 2024-06-16 MED ORDER — MAGNESIUM SULFATE 2 GM/50ML IV SOLN
2.0000 g | Freq: Once | INTRAVENOUS | Status: AC
  Administered 2024-06-16: 2 g via INTRAVENOUS
  Filled 2024-06-16: qty 50

## 2024-06-16 MED ORDER — FENTANYL CITRATE PF 50 MCG/ML IJ SOSY
25.0000 ug | PREFILLED_SYRINGE | INTRAMUSCULAR | Status: DC | PRN
  Administered 2024-06-16 – 2024-06-22 (×15): 50 ug via INTRAVENOUS
  Administered 2024-06-23: 100 ug via INTRAVENOUS
  Administered 2024-06-23: 50 ug via INTRAVENOUS
  Filled 2024-06-16 (×6): qty 1
  Filled 2024-06-16: qty 2
  Filled 2024-06-16 (×11): qty 1

## 2024-06-16 MED ORDER — DEXTROSE IN LACTATED RINGERS 5 % IV SOLN: INTRAVENOUS | Status: AC

## 2024-06-16 NOTE — Progress Notes (Signed)
 OT Cancellation Note  Patient Details Name: Christina Castaneda MRN: [id number] DOB: [birth year]   Cancelled Treatment:    Reason Eval/Treat Not Completed: Medical issues which prohibited therapy (intubated, sedated)  Joy Haegele K, OTD, OTR/L [employer] [facility] [phone]   Christina Castaneda 06/16/2024, 7:25 AM

## 2024-06-16 NOTE — Progress Notes (Signed)
 To bedside to eval vent alam and found pt had self extubated. Had been on PSV 5/5   Remaining PAD shut off RT and Rns quickly to bedside, pt placed on [place] and satting well.  Strong voice and cough  NPO  IS, pulm hygiene      Ronnald Gave MSN, AGACNP-BC [facility] 06/16/2024, 8:51 AM

## 2024-06-16 NOTE — Consult Note (Signed)
 Denelda Akerley Peoples Mar 22, 1956  [id number].    Requesting MD: Dr. Dorn Chill Chief Complaint/Reason for Consult: post op management from [facility]  HPI:  Per HPI from Dr. Evonnie: TANNY HARNACK is a 68 y.o. female who was admitted to the hospital with a left femur fracture status post retrograde left femoral nail on 6/20 with Dr. Onesimo.  His multiple other medical problems during this admission including AKI, acute hypoxic respiratory failure with COPD, and a persistent leukocytosis thought to be possibly secondary to a recently treated pneumonia.  Overnight this morning, patient was noted to be tachycardic into the 120s with downtrending blood pressure and melanotic stool from her colostomy.  Blood work demonstrated hemoglobin of 6.2 and platelets unable to be read out, so she was ordered to receive a unit of PRBCs and platelets overnight.  There was concern for a potential bleeding ulcer.  Central venous catheter was inserted by the ED physician, and Levophed  was initiated in addition to vasopressin.  Levophed  was titrated up to 25.  She subsequently underwent a CTA of the abdomen and aorta with iliofemoral runoff.  This imaging study is demonstrating concern for perforated and bleeding peptic ulcer affecting the proximal duodenum.  Active extravasation was not able to be visualized, however there is a hematoma within the gastric antrum and proximal duodenum in addition to pneumoperitoneum within the upper abdomen and within a ventral hernia.  She also has a large ventral hernia containing the gastric body, portion of ascending colon, and multiple loops of small bowel.   This morning, the patient has some upper abdominal pain with palpation, but otherwise is doing okay.  She denies nausea and vomiting.  Her biggest statement is that she wants to sleep.  She currently is on 5 of Levophed  and vasopressin.  She has received 3 units PRBCs and 1 unit of platelets.   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 Kimble  Left colectomy and end colostomy (03/30/16)--Dr. Ebbie Re-exploration of open abdomen and application of wound vac (04/02/16)--Dr. Tanda Placement of ABRA abdominal wall closure device (04/05/16)--Dr. Tanda Removal of ABRA device and closure of abdominal wall (04/18/16)--Dr. Cornett  She underwent laparotomy with omental graham patch repair and LOA with JP drain placement by Dr. Evonnie 6/23.  She remained intubated and on pressors and was tx to [facility] for further ICU care.  She self-extubated this morning and is currently doing well, but still on some low dose pressors.  We have been asked to assist in post op management.  ROS: ROS: see HPI, currently c/o dry nose  Family History  Problem Relation Age of Onset   COPD Mother    Diabetes Mother    Congestive Heart Failure Mother    Diabetes Father    Heart attack Father    Colon cancer Neg Hx    Colon polyps Neg Hx     Past Medical History:  Diagnosis Date   Asthma    mild   Atypical endometrial hyperplasia    COPD (chronic obstructive pulmonary disease) (HCC)    Goiter    Gout    Heart murmur    Hypertension    Hypothyroidism    Vitamin B12 deficiency    Yeast infection    for last 3 weeks  Past Surgical History:  Procedure Laterality Date   ABDOMINAL WALL DEFECT REPAIR N/A 04/18/2016   Procedure: CLOSURE OF ABDOMEN;  Surgeon: Debby Shipper, MD;  Location: [facility] OR;  Service: General;  Laterality: N/A;   APPENDECTOMY     APPLICATION OF WOUND VAC N/A 04/02/2016   Procedure: application of wound vac+;  Surgeon: Camellia Blush, MD;  Location: [facility] OR;  Service: General;  Laterality: N/A;   CHOLECYSTECTOMY  2009   gallstone removed   COLOSTOMY N/A 03/30/2016   Procedure:  COLOSTOMY;  Surgeon: Donnice Bury, MD;  Location: [facility] OR;  Service: General;  Laterality: N/A;   COLOSTOMY REVISION N/A 03/28/2016   Procedure: COLOSTOMY REVISION;  Surgeon: Lynwood Pina, MD;  Location: [facility] OR;  Service: General;  Laterality: N/A;   COLOSTOMY REVISION N/A 03/30/2016   Procedure: COLON RESECTION LEFT;  Surgeon: Donnice Bury, MD;  Location: [facility] OR;  Service: General;  Laterality: N/A;   LAPAROTOMY N/A 03/28/2016   Procedure: EXPLORATORY LAPAROTOMY;  Surgeon: Lynwood Pina, MD;  Location: [facility] OR;  Service: General;  Laterality: N/A;   LAPAROTOMY N/A 04/02/2016   Procedure: Re-exploration of open abdomen, application of abdominal wound vac;  Surgeon: Camellia Blush, MD;  Location: [facility] OR;  Service: General;  Laterality: N/A;   LAPAROTOMY N/A 04/04/2016   Procedure: EXPLORATORY LAPAROTOMY, PLACEMENT OF ABRA ABDOMINAL WALL CLOSURE SET ;  Surgeon: Camellia Blush, MD;  Location: [facility] OR;  Service: General;  Laterality: N/A;   LAPAROTOMY N/A 04/18/2016   Procedure: EXPLORATORY LAPAROTOMY;  Surgeon: Debby Shipper, MD;  Location: [facility] OR;  Service: General;  Laterality: N/A;   NECK SURGERY  1994   repair disk   OMENTECTOMY N/A 03/28/2016   Procedure: OMENTECTOMY;  Surgeon: Lynwood Pina, MD;  Location: [facility] OR;  Service: General;  Laterality: N/A;   ROBOTIC ASSISTED TOTAL HYSTERECTOMY WITH BILATERAL SALPINGO OOPHERECTOMY  11/18/2012   Procedure: ROBOTIC ASSISTED TOTAL HYSTERECTOMY WITH BILATERAL SALPINGO OOPHORECTOMY;  Surgeon: Elenore A. Dodie, MD;  Location: [facility] ORS;  Service: Gynecology;  Laterality: N/A;   TONSILLECTOMY     as a child   VACUUM ASSISTED CLOSURE CHANGE N/A 03/30/2016   Procedure: ABDOMINAL VAC CHANGE;  Surgeon: Donnice Bury, MD;  Location: [facility] OR;  Service: General;  Laterality: N/A;   WOUND DEBRIDEMENT N/A 03/28/2016   Procedure: DEBRIDEMENT WOUND;  Surgeon: Lynwood Pina, MD;  Location: [facility] OR;  Service: General;  Laterality: N/A;    Social History:  reports that she has been smoking  cigarettes. She has a 42 pack-year smoking history. She has never used smokeless tobacco. She reports current alcohol use of about 1.0 - 3.0 standard drink of alcohol per week. She reports that she does not use drugs.  Allergies:  Allergies  Allergen Reactions   Penicillins Anaphylaxis, Hives, Shortness Of Breath and Swelling    Pt given ceftriaxone  05/27/24 in ED and tolerated   Biphosphate Other (See Comments)    Aching joints   Heparin  Other (See Comments)    Rule out HIT   Fluticasone  Other (See Comments)    Headaches    Facility-Administered Medications Prior to Admission  Medication Dose Route Frequency Provider Last Rate Last Admin   olmesartan  (BENICAR ) tablet 20 mg  20 mg Oral Daily Wert, Michael B, MD       Medications Prior to Admission  Medication Sig Dispense Refill   acetaminophen  (TYLENOL ) 500 MG tablet Take 1,000 mg by mouth every 6 (six) hours.     albuterol  (VENTOLIN  HFA) 108 (90 Base) MCG/ACT inhaler  Inhale 2 puffs into the lungs every 6 (six) hours as needed. Wheezing and shortness of breath 8 g 3   allopurinol  (ZYLOPRIM ) 300 MG tablet Take 300 mg by mouth daily.     Cyanocobalamin (VITAMIN B-12 CR PO) Take 1 tablet by mouth daily.     Ergocalciferol  50 MCG (2000 UT) TABS Take 2,000 Units by mouth daily.     fluticasone -salmeterol (ADVAIR) 500-50 MCG/ACT AEPB Inhale 1 puff into the lungs in the morning and at bedtime. 60 each 0   gabapentin  (NEURONTIN ) 300 MG capsule Take 1-2 capsules by mouth at bedtime.     levothyroxine  (SYNTHROID ) 137 MCG tablet Take 137 mcg by mouth daily.     nystatin  (MYCOSTATIN /NYSTOP ) powder Apply topically 3 (three) times daily. 15 g 0   rosuvastatin  (CRESTOR ) 10 MG tablet Take 10 mg by mouth daily.     senna-docusate (SENOKOT-S) 8.6-50 MG tablet Take 2 tablets by mouth at bedtime. 60 tablet 0     Physical Exam: Blood pressure (!) 128/45, pulse 76, temperature 98 F (36.7 C), temperature source Axillary, resp. rate (!) 21, height 5' 5  (1.651 m), weight 105 kg, last menstrual period 11/18/2012, SpO2 100%. General: morbidly obese, elderly white female who is laying in bed in NAD HEENT: head is normocephalic, atraumatic.  Sclera are noninjected.  PERRL.  Ears and nose without any masses or lesions.  Mouth is pink and dry Heart: regular, rate, and rhythm.  Normal s1,s2. No obvious murmurs, gallops, or rubs noted.   Lungs: CTAB, no wheezes, rhonchi, or rales noted.  Respiratory effort nonlabored on [place] Abd: soft, appropriately tender, obese, loss of domain of lower abdomen with RLQ colostomy with no output currently.  JP with serosang output, 40cc since admit here.  NGT in place with minimal output, but is dark bloody MS: KI to LLE Psych: A&Ox3 with an appropriate affect.   Results for orders placed or performed during the hospital encounter of 06/07/24 (from the past 48 hours)  Glucose, capillary     Status: None   Collection Time: 06/14/24  5:47 PM  Result Value Ref Range   Glucose-Capillary 95 70 - 99 mg/dL    Comment: Glucose reference range applies only to samples taken after fasting for at least 8 hours.   Comment 1 Notify RN    Comment 2 Document in Chart   Hemoglobin and hematocrit, blood     Status: Abnormal   Collection Time: 06/14/24  7:41 PM  Result Value Ref Range   Hemoglobin 7.0 (L) 12.0 - 15.0 g/dL   HCT 77.7 (L) 63.9 - 53.9 %    Comment: Performed at [facility], [address], [place], [place] [address]  Hemoglobin and hematocrit, blood     Status: Abnormal   Collection Time: 06/14/24 11:47 PM  Result Value Ref Range   Hemoglobin 6.2 (LL) 12.0 - 15.0 g/dL    Comment: This critical result has verified and been called to [facility] by Rolin Glinda Jackson on 06 23 2025 at 0102, and has been read back.  REPEATED TO VERIFY CORRECTED ON 06/24 AT 1045: PREVIOUSLY REPORTED AS 6.2 This critical result has verified and been called to [facility] by Rolin Glinda Jackson on 06 23 2025 at 0102, and has  been read back.     HCT 19.8 (L) 36.0 - 46.0 %    Comment: Performed at [facility], [address] [place], [place] [address]  Basic metabolic panel with GFR     Status: Abnormal  Collection Time: 06/15/24  1:54 AM  Result Value Ref Range   Sodium 136 135 - 145 mmol/L   Potassium 4.7 3.5 - 5.1 mmol/L   Chloride 102 98 - 111 mmol/L   CO2 21 (L) 22 - 32 mmol/L   Glucose, Bld 112 (H) 70 - 99 mg/dL    Comment: Glucose reference range applies only to samples taken after fasting for at least 8 hours.   BUN 72 (H) 8 - 23 mg/dL   Creatinine, Ser 8.70 (H) 0.44 - 1.00 mg/dL   Calcium  8.9 8.9 - 10.3 mg/dL   GFR, Estimated 45 (L) >60 mL/min    Comment: (NOTE) Calculated using the CKD-EPI Creatinine Equation (2021)    Anion gap 13 5 - 15    Comment: Performed at [facility], [address], [place], [place] [address]  Magnesium      Status: None   Collection Time: 06/15/24  1:54 AM  Result Value Ref Range   Magnesium  2.2 1.7 - 2.4 mg/dL    Comment: Performed at [facility], [address], [place], [place] [address]  Hemoglobin and hematocrit, blood     Status: Abnormal   Collection Time: 06/15/24  1:54 AM  Result Value Ref Range   Hemoglobin 6.3 (LL) 12.0 - 15.0 g/dL    Comment: This critical result has verified and been called to [facility] by Rolin Glinda Jackson on 06 23 2025 at 0239, and has been read back.  REPEATED TO VERIFY CORRECTED ON 06/24 AT 1045: PREVIOUSLY REPORTED AS 6.3 This critical result has verified and been called to [facility] by Rolin Glinda Jackson on 06 23 2025 at 0239, and has been read back.     HCT 20.2 (L) 36.0 - 46.0 %    Comment: Performed at [facility], [address], [place], [place] [address]  Prepare RBC (crossmatch)     Status: None   Collection Time: 06/15/24  1:54 AM  Result Value Ref Range   Order Confirmation      ORDER PROCESSED BY BLOOD BANK Performed at [facility], [address], [place], [place] [address]   Type and screen  [facility]     Status: None (Preliminary result)   Collection Time: 06/15/24  1:54 AM  Result Value Ref Range   ABO/RH(D) O POS    Antibody Screen NEG    Sample Expiration 06/18/2024,2359    Unit Number [id number]    Blood Component Type RED CELLS,LR    Unit division 00    Status of Unit ISSUED,FINAL    Transfusion Status OK TO TRANSFUSE    Crossmatch Result Compatible    Unit Number [id number]    Blood Component Type RED CELLS,LR    Unit division 00    Status of Unit ISSUED,FINAL    Transfusion Status OK TO TRANSFUSE    Crossmatch Result      Compatible Performed at [facility], [address], [place], [place] [address]    Unit Number [id number]    Blood Component Type RED CELLS,LR    Unit division 00    Status of Unit ISSUED,FINAL    Transfusion Status OK TO TRANSFUSE    Crossmatch Result Compatible    Unit Number [id number]    Blood Component Type RED CELLS,LR    Unit division 00    Status of Unit ISSUED,FINAL    Transfusion Status OK TO TRANSFUSE    Crossmatch Result Compatible    Unit Number [id number]    Blood Component  Type RED CELLS,LR    Unit division 00    Status of Unit ALLOCATED    Transfusion Status OK TO TRANSFUSE    Crossmatch Result Compatible   Prepare platelet pheresis     Status: None   Collection Time: 06/15/24  1:54 AM  Result Value Ref Range   Unit Number [id number]    Blood Component Type PLTP1 PSORALEN TREATED    Unit division 00    Status of Unit ISSUED,FINAL    Transfusion Status      OK TO TRANSFUSE Performed at [facility], [address] [place], [place] [address]   MRSA Next Gen by PCR, Nasal     Status: None   Collection Time: 06/15/24  8:01 AM   Specimen: Nasal Mucosa; Nasal Swab  Result Value Ref Range   MRSA by PCR Next Gen NOT DETECTED NOT DETECTED    Comment: (NOTE) The GeneXpert MRSA Assay (FDA approved for NASAL specimens only), is one component of a comprehensive MRSA colonization  surveillance program. It is not intended to diagnose MRSA infection nor to guide or monitor treatment for MRSA infections. Test performance is not FDA approved in patients less than 40 years old. Performed at [facility], [address], [place], [place] [address]   Prepare RBC (crossmatch)     Status: None   Collection Time: 06/15/24  8:48 AM  Result Value Ref Range   Order Confirmation      ORDER PROCESSED BY BLOOD BANK Performed at [facility], [address], [place], [place] [address]   CBC     Status: Abnormal   Collection Time: 06/15/24 11:42 AM  Result Value Ref Range   WBC 23.2 (H) 4.0 - 10.5 K/uL   RBC 3.00 (L) 3.87 - 5.11 MIL/uL   Hemoglobin 9.9 (L) 12.0 - 15.0 g/dL    Comment: REPEATED TO VERIFY POST TRANSFUSION SPECIMEN    HCT 28.9 (L) 36.0 - 46.0 %   MCV 96.3 80.0 - 100.0 fL   MCH 33.0 26.0 - 34.0 pg   MCHC 34.3 30.0 - 36.0 g/dL   RDW 83.8 (H) 88.4 - 84.4 %   Platelets 149 (L) 150 - 400 K/uL   nRBC 1.2 (H) 0.0 - 0.2 %    Comment: Performed at [facility], [address] [place], [place] [address]  Prepare RBC (crossmatch)     Status: None   Collection Time: 06/15/24  3:11 PM  Result Value Ref Range   Order Confirmation      ORDER PROCESSED BY BLOOD BANK Performed at [facility], [address], [place], [place] [address]   Aerobic/Anaerobic Culture w Gram Stain (surgical/deep wound)     Status: None (Preliminary result)   Collection Time: 06/15/24  3:24 PM   Specimen: Path fluid; Body Fluid  Result Value Ref Range   Specimen Description      PERITONEAL Performed at [facility], [address], [place], [place] [address]    Special Requests      NONE Performed at [facility], [address], [place], [place] [address]    Gram Stain      RARE WBC PRESENT, PREDOMINANTLY PMN NO ORGANISMS SEEN    Culture      TOO YOUNG TO READ Performed at [facility], [address] [place], [place] [address]    Report Status PENDING   Culture, blood  (Routine X 2) w Reflex to ID Panel     Status: None (Preliminary result)  Collection Time: 06/15/24  5:13 PM   Specimen: BLOOD  Result Value Ref Range   Specimen Description BLOOD LEFT ANTECUBITAL    Special Requests      BOTTLES DRAWN AEROBIC AND ANAEROBIC Blood Culture adequate volume   Culture      NO GROWTH < 24 HOURS Performed at [facility], [address], [place], [place] [address]    Report Status PENDING   Culture, blood (Routine X 2) w Reflex to ID Panel     Status: None (Preliminary result)   Collection Time: 06/15/24  5:24 PM   Specimen: BLOOD  Result Value Ref Range   Specimen Description BLOOD LEFT ANTECUBITAL    Special Requests      BOTTLES DRAWN AEROBIC AND ANAEROBIC Blood Culture results may not be optimal due to an inadequate volume of blood received in culture bottles   Culture      NO GROWTH < 24 HOURS Performed at [facility], [address], [place], [place] [address]    Report Status PENDING   Blood gas, arterial     Status: Abnormal   Collection Time: 06/15/24  5:25 PM  Result Value Ref Range   pH, Arterial 7.33 (L) 7.35 - 7.45   pCO2 arterial 44 32 - 48 mmHg   pO2, Arterial 122 (H) 83 - 108 mmHg   Bicarbonate 23.2 20.0 - 28.0 mmol/L   Acid-base deficit 2.8 (H) 0.0 - 2.0 mmol/L   O2 Saturation 99.2 %   Patient temperature 37.0    Collection site A-LINE    Drawn by [id number]    Allens test (pass/fail) A-LINE (A) PASS    Comment: Performed at [facility], [address] [place], [place] [address]  Glucose, capillary     Status: Abnormal   Collection Time: 06/15/24  7:26 PM  Result Value Ref Range   Glucose-Capillary 122 (H) 70 - 99 mg/dL    Comment: Glucose reference range applies only to samples taken after fasting for at least 8 hours.  I-STAT 7, (LYTES, BLD GAS, ICA, H+H)     Status: Abnormal   Collection Time: 06/15/24  9:27 PM  Result Value Ref Range   pH, Arterial 7.365 7.35 - 7.45   pCO2 arterial 38.2 32 - 48 mmHg   pO2, Arterial 134 (H) 83  - 108 mmHg   Bicarbonate 22.0 20.0 - 28.0 mmol/L   TCO2 23 22 - 32 mmol/L   O2 Saturation 99 %   Acid-base deficit 3.0 (H) 0.0 - 2.0 mmol/L   Sodium 136 135 - 145 mmol/L   Potassium 4.6 3.5 - 5.1 mmol/L   Calcium , Ion 1.22 1.15 - 1.40 mmol/L   HCT 23.0 (L) 36.0 - 46.0 %   Hemoglobin 7.8 (L) 12.0 - 15.0 g/dL   Patient temperature 02.9 F    Sample type ARTERIAL   Comprehensive metabolic panel     Status: Abnormal   Collection Time: 06/15/24  9:42 PM  Result Value Ref Range   Sodium 135 135 - 145 mmol/L   Potassium 4.6 3.5 - 5.1 mmol/L   Chloride 106 98 - 111 mmol/L   CO2 22 22 - 32 mmol/L   Glucose, Bld 129 (H) 70 - 99 mg/dL    Comment: Glucose reference range applies only to samples taken after fasting for at least 8 hours.   BUN 84 (H) 8 - 23 mg/dL   Creatinine, Ser 8.45 (H) 0.44 - 1.00 mg/dL   Calcium  7.9 (L) 8.9 - 10.3 mg/dL  Total Protein 3.8 (L) 6.5 - 8.1 g/dL   Albumin <8.4 (L) 3.5 - 5.0 g/dL   AST 93 (H) 15 - 41 U/L   ALT 41 0 - 44 U/L   Alkaline Phosphatase 39 38 - 126 U/L   Total Bilirubin 0.9 0.0 - 1.2 mg/dL   GFR, Estimated 36 (L) >60 mL/min    Comment: (NOTE) Calculated using the CKD-EPI Creatinine Equation (2021)    Anion gap 7 5 - 15    Comment: Performed at [facility], [address] [place], [place] [address]  CBC     Status: Abnormal   Collection Time: 06/15/24  9:42 PM  Result Value Ref Range   WBC 27.6 (H) 4.0 - 10.5 K/uL    Comment: WHITE COUNT CONFIRMED ON SMEAR   RBC 2.72 (L) 3.87 - 5.11 MIL/uL   Hemoglobin 8.7 (L) 12.0 - 15.0 g/dL   HCT 74.0 (L) 63.9 - 53.9 %   MCV 95.2 80.0 - 100.0 fL   MCH 32.0 26.0 - 34.0 pg   MCHC 33.6 30.0 - 36.0 g/dL   RDW 83.9 (H) 88.4 - 84.4 %   Platelets 166 150 - 400 K/uL    Comment: REPEATED TO VERIFY   nRBC 0.7 (H) 0.0 - 0.2 %    Comment: Performed at [facility], [address], [place], [place] [address]  Protime-INR     Status: Abnormal   Collection Time: 06/15/24  9:42 PM  Result Value Ref  Range   Prothrombin Time 19.9 (H) 11.4 - 15.2 seconds   INR 1.7 (H) 0.8 - 1.2    Comment: (NOTE) INR goal varies based on device and disease states. Performed at [facility], [address], [place], [place] [address]   APTT     Status: None   Collection Time: 06/15/24  9:42 PM  Result Value Ref Range   aPTT 29 24 - 36 seconds    Comment: Performed at [facility], [address], [place], [place] [address]  Magnesium      Status: None   Collection Time: 06/15/24  9:42 PM  Result Value Ref Range   Magnesium  1.8 1.7 - 2.4 mg/dL    Comment: Performed at [facility], [address] [place], [place] [address]  Glucose, capillary     Status: Abnormal   Collection Time: 06/15/24 11:21 PM  Result Value Ref Range   Glucose-Capillary 103 (H) 70 - 99 mg/dL    Comment: Glucose reference range applies only to samples taken after fasting for at least 8 hours.  CBC     Status: Abnormal   Collection Time: 06/16/24  4:15 AM  Result Value Ref Range   WBC 25.1 (H) 4.0 - 10.5 K/uL   RBC 2.63 (L) 3.87 - 5.11 MIL/uL   Hemoglobin 8.2 (L) 12.0 - 15.0 g/dL   HCT 74.7 (L) 63.9 - 53.9 %   MCV 95.8 80.0 - 100.0 fL   MCH 31.2 26.0 - 34.0 pg   MCHC 32.5 30.0 - 36.0 g/dL   RDW 83.6 (H) 88.4 - 84.4 %   Platelets 152 150 - 400 K/uL    Comment: Immature Platelet Fraction may be clinically indicated, consider ordering this additional test [id number] REPEATED TO VERIFY    nRBC 0.8 (H) 0.0 - 0.2 %    Comment: Performed at [facility], [address] [place], [place] [address]  Comprehensive metabolic panel with GFR  Status: Abnormal   Collection Time: 06/16/24  4:15 AM  Result Value Ref Range   Sodium 134 (L) 135 - 145 mmol/L   Potassium 4.6 3.5 - 5.1 mmol/L   Chloride 107 98 - 111 mmol/L   CO2 22 22 - 32 mmol/L   Glucose, Bld 118 (H) 70 - 99 mg/dL    Comment: Glucose reference range applies only to samples taken after fasting for at least 8 hours.   BUN 84 (H) 8 - 23  mg/dL   Creatinine, Ser 8.43 (H) 0.44 - 1.00 mg/dL   Calcium  7.5 (L) 8.9 - 10.3 mg/dL   Total Protein 3.9 (L) 6.5 - 8.1 g/dL   Albumin <8.4 (L) 3.5 - 5.0 g/dL   AST 70 (H) 15 - 41 U/L   ALT 36 0 - 44 U/L   Alkaline Phosphatase 41 38 - 126 U/L   Total Bilirubin 0.9 0.0 - 1.2 mg/dL   GFR, Estimated 36 (L) >60 mL/min    Comment: (NOTE) Calculated using the CKD-EPI Creatinine Equation (2021)    Anion gap 5 5 - 15    Comment: Performed at [facility], [address] [place], [place] [address]  Magnesium      Status: None   Collection Time: 06/16/24  4:15 AM  Result Value Ref Range   Magnesium  1.9 1.7 - 2.4 mg/dL    Comment: Performed at [facility], [address] [place], [place] [address]  Triglycerides     Status: None   Collection Time: 06/16/24  4:15 AM  Result Value Ref Range   Triglycerides 120 <150 mg/dL    Comment: Performed at [facility], [address] [place], [place] [address]  Glucose, capillary     Status: Abnormal   Collection Time: 06/16/24  8:03 AM  Result Value Ref Range   Glucose-Capillary 101 (H) 70 - 99 mg/dL    Comment: Glucose reference range applies only to samples taken after fasting for at least 8 hours.  Glucose, capillary     Status: None   Collection Time: 06/16/24 11:15 AM  Result Value Ref Range   Glucose-Capillary 73 70 - 99 mg/dL    Comment: Glucose reference range applies only to samples taken after fasting for at least 8 hours.   DG Chest Port 1 View Result Date: 06/15/2024 CLINICAL DATA:  Endotracheal tube EXAM: PORTABLE CHEST 1 VIEW COMPARISON:  Chest x-ray 06/15/2024 FINDINGS: Right-sided central venous catheter tip projects over the mid SVC. Endotracheal tube tip is 3.5 cm above the carina. Enteric tube extends below the diaphragm. There are left basilar airspace opacities. Costophrenic angles are now clear. There is no pneumothorax. Cardiomediastinal silhouette is stable, the heart is mildly enlarged. Osseous structures  are unchanged. IMPRESSION: 1. Left basilar airspace opacities, atelectasis versus pneumonia. 2. Support apparatus as above. Electronically Signed   By: Greig Pique M.D.   On: 06/15/2024 20:00   DG Chest Port 1 View Result Date: 06/15/2024 CLINICAL DATA:  Endotracheal tube EXAM: PORTABLE CHEST 1 VIEW COMPARISON:  Chest x-ray 06/15/2024 FINDINGS: The endotracheal tube tip is 2 cm above the carina. Right-sided central venous catheter tip projects over the distal SVC. Enteric tube extends below the diaphragm. A small left pleural effusion is present with some patchy left basilar opacities. The right lung is clear. There is no pneumothorax or acute fracture. IMPRESSION: 1. Endotracheal tube tip 2 cm above the carina. 2. Small left pleural effusion with patchy left basilar opacities, atelectasis  versus pneumonia. Electronically Signed   By: Greig Pique M.D.   On: 06/15/2024 16:54   CT ANGIO AO+BIFEM W & OR WO CONTRAST Result Date: 06/15/2024 CLINICAL DATA:  Hemorrhagic shock.  Evaluate for site of bleeding. EXAM: CT ANGIOGRAPHY OF ABDOMINAL AORTA WITH ILIOFEMORAL RUNOFF TECHNIQUE: Multidetector CT imaging of the abdomen, pelvis and lower extremities was performed using the standard protocol during bolus administration of intravenous contrast. Multiplanar CT image reconstructions and MIPs were obtained to evaluate the vascular anatomy. RADIATION DOSE REDUCTION: This exam was performed according to the departmental dose-optimization program which includes automated exposure control, adjustment of the mA and/or kV according to patient size and/or use of iterative reconstruction technique. CONTRAST:  OMNIPAQUE  IOHEXOL  300 MG/ML  SOLN COMPARISON:  Prior renal ultrasound 06/08/2024 FINDINGS: VASCULAR Aorta: Normal caliber aorta without aneurysm, dissection, vasculitis or significant stenosis. Scattered calcified atherosclerotic plaque. Celiac: Patent without evidence of aneurysm, dissection, vasculitis or  significant stenosis. SMA: Patent without evidence of aneurysm, dissection, vasculitis or significant stenosis. Renals: 2 right-sided renal arteries. Single left renal artery. Calcified atherosclerotic plaque bilaterally with at least moderate stenosis on the left and mild to moderate stenosis on the right. IMA: Atretic, likely chronic high-grade stenosis or occlusion of the origin. RIGHT Lower Extremity Inflow: Scattered atherosclerotic plaque without significant stenosis or occlusion. No aneurysm or dissection. Outflow: Similarly, mild scattered atherosclerotic plaque without significant stenosis or occlusion involving the common femoral, superficial femoral or profunda femoral arteries. The popliteal artery is widely patent. Runoff: High origin of the anterior tibial artery arising from the proximal P3 segment of the popliteal artery at the level of the knee joint. Patent 3 vessel runoff to the ankle. LEFT Lower Extremity Inflow: Mild scattered calcified atherosclerotic plaque without significant stenosis or occlusion. No aneurysm or dissection. Outflow: Common femoral, profunda femoral and superficial femoral arteries are widely patent. No evidence of active bleeding. Runoff: High origin of the anterior tibial artery arising from the P2 segment of the popliteal artery at the level of the lower pole of the patella. Patent 3 vessel runoff to the ankle. No evidence of active bleeding. Veins: No focal venous abnormality. Review of the MIP images confirms the above findings. NON-VASCULAR Lower chest: Small bilateral pleural effusions and associated bibasilar atelectasis. Hepatobiliary: Normal hepatic contour and morphology. No discrete hepatic lesion. Surgical changes of cholecystectomy. No evidence of biliary ductal dilatation. Pancreas: Unremarkable. No pancreatic ductal dilatation or surrounding inflammatory changes. Spleen: Normal in size without focal abnormality. Adrenals/Urinary Tract: Unremarkable adrenal  glands. No hydronephrosis or enhancing renal mass. 1.9 cm water  attenuation simple cyst exophytic from the interpolar right kidney. No imaging follow-up is recommended. 9 mm nonobstructing stone in the interpolar left renal collecting system. The ureters are unremarkable. A Foley catheter is in place. Stomach/Bowel: Limited evaluation for GI bleeding given scan technique. There is no pre contrast imaging and no delayed imaging. Scattered high attenuation material is present throughout the distal bowel consistent with prior barium administration. There is a small amount of high attenuation material layering dependently within the stomach as well as within the gastric antrum and duodenal bulb. However, there is intermediate attenuation density expanding the distal gastric antrum, duodenal bulb and proximal duodenum highly concerning for hematoma. Additionally, some high attenuation material is present within this presumed hematoma which does not appear to be layering dependently and is concerning for a focus of active arterial extravasation. The gastric antral and proximal duodenal wall are thickened and there is inflammatory stranding in the periduodenal fat.  Additionally, several small bubbles are present immediately adjacent to the duodenal bulb. Findings suggest possible perforated peptic ulcer disease with probable bleeding. 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 Lymphatic: No suspicious lymphadenopathy. Reproductive: Status post hysterectomy. No adnexal masses. Other: Large midline ventral hernia containing the anterior body of the stomach, portions of the ascending colon and numerous loops of small bowel. Additionally, there is a moderate volume of free air scattered throughout abdomen. Musculoskeletal: Extensive multilevel degenerative disc disease. Comminuted distal left femoral fracture status post ORIF with an  intramedullary nail. There are 2 proximal and 3 distal interlocking screws. The distal interlocking screws expected small volume hematoma surrounding the comminuted fracture site. No evidence of arterial injury or arterial bleeding. Multilevel degenerative disc disease is present. Bilateral lower lumbar facet arthropathy also noted. IMPRESSION: 1. Overall CT findings are highly concerning for perforated and bleeding peptic ulcer disease affecting the proximal duodenum, potentially stress related peptic ulcer disease. Additionally, while the study is not optimized to detect GI bleeding (no precontrast or delayed phase imaging) there appears to be hematoma within the gastric antrum and proximal duodenum with some scattered high attenuation material which may represent active extravasation versus ingested oral contrast (there is evidence of oral contrast elsewhere in the bowel). Hemorrhage is favored given the clinical history and the evidence of perforated ulceration. Scattered free air throughout the upper abdomen and within the ventral hernia sac. 2. Large ventral abdominal hernia containing the gastric body, a portion of the ascending colon and multiple loops of small bowel as well as a moderate volume of free air. 3. No evidence of significant hemorrhage related to the comminuted distal femoral fracture or recent ORIF. There is a small amount of expected Peri fracture hematoma but no evidence of arterial injury or active bleeding. 4. Extensive scattered calcified atherosclerotic plaque as detailed above. 5. Bilateral high origins of the anterior tibial arteries. 6. Small bilateral pleural effusions and associated atelectasis. 7. Surgical changes of prior left hemicolectomy with Hartmann's pouch and diverting right lower quadrant colostomy. 8. Surgical changes of prior cholecystectomy. 9. Advanced multilevel degenerative disc disease and lower lumbar facet arthropathy. Critical Value/emergent results were called by  telephone at the time of interpretation on 06/15/2024 at 10:40 am to provider [facility] , who verbally acknowledged these results. Electronically Signed   By: Wilkie Lent M.D.   On: 06/15/2024 10:52   DG Chest Port 1 View Result Date: 06/15/2024 EXAM: 1 VIEW XRAY OF THE CHEST 06/15/2024 06:03:00 AM COMPARISON: 1 view chest x-ray dated 06/07/2024. CLINICAL HISTORY: [id number] S/P PICC central line placement [id number]. PICC central line plcmt S/P PICC central line placement PICC central line plcmt FINDINGS: LUNGS AND PLEURA: Asymmetric left basilar airspace disease and effusion is again noted. HEART AND MEDIASTINUM: No acute abnormality of the cardiac and mediastinal silhouettes. BONES AND SOFT TISSUES: No acute osseous abnormality. LINES AND TUBES: A new right IJ line terminates at the cavoatrial junction. No pneumothorax is present. IMPRESSION: 1. New right IJ line terminates at the cavoatrial junction. 2. Asymmetric left basilar airspace disease and effusion, again noted. Electronically signed by: Lonni Necessary MD 06/15/2024 06:18 AM EDT RP Workstation: [id number]   DG Abd 1 View Result Date: 06/15/2024 CLINICAL DATA:  Unspecified abdominal pain EXAM: ABDOMEN - 1 VIEW COMPARISON:  None Available. FINDINGS: The bowel gas pattern is normal. No radio-opaque calculi or other significant radiographic abnormality are seen. Mild gaseous distension of the stomach. Nonobstructive bowel  gas pattern. Moderate stool burden. No free intraperitoneal gas. Rounded 6 mm calcification within the left paraspinal region may represent a renal calculus. Coarse calcification within the left lower quadrant is nonspecific, possibly the sequela remote trauma or inflammation. Numerous surgical clips seen within the epigastrium and left lower quadrant. Left femoral ORIF partially visualized. No acute bone abnormality. IMPRESSION: 1. Nonobstructive bowel gas pattern. Moderate stool burden. 2. Possible 6 mm left renal calculus.  Electronically Signed   By: Dorethia Molt M.D.   On: 06/15/2024 03:03      Assessment/Plan POD 1, s/p ex lap with graham patch repair and JP drain placement for Perforated duodenal ulcer, possible bleeding ulcer as well, Dr. Johanna at [facility] 6/23 -cont NGT and BID PPI -monitor output from NGT -UGI likely on POD 3 to eval for leak -strict NPO at this time -routine colostomy care, can consult WOC if needed -OOB and mobilize as able -foley can DC from surgical standpoint when ok from primary standpoint. -hgb 8.2 from 8.7 -WBC 25.  Cont abx therapy, Zosyn, can add fungal coverage if WBC doesn't respond just to Zosyn (per pharmacy did have 24 hrs of coverage prior to stopping this.)  FEN - strict NPO/NGT/fluids per primary VTE - on hold til hgb stable ID - zosyn, ok to stop vanc, may need to add fungal coverage back due to foregut etiology.  Will d/w MD   I reviewed nursing notes, Consultant CCM, general surgery at [facility] notes, last 24 h vitals and pain scores, last 48 h intake and output, last 24 h labs and trends, and last 24 h imaging results.  Burnard FORBES Banter, [facility] 06/16/2024, 11:32 AM Please see Amion for pager number during day hours 7:00am-4:30pm or 7:00am -11:30am on weekends

## 2024-06-16 NOTE — Progress Notes (Signed)
 eLink Physician-Brief Progress Note Patient Name: Christina Castaneda DOB: 21-Mar-1956 MRN: [id number]   Date of Service  06/16/2024  HPI/Events of Note  Currently, the patient's vasopressors being titrated to MAP goal greater than 65.  Has extremely wide pulse pressure with BP of 121/50.  eICU Interventions  Adjust goals to systolic greater than 100     Intervention Category Intermediate Interventions: Hypotension - evaluation and management  Donicia Druck 06/16/2024, 12:03 AM

## 2024-06-16 NOTE — Progress Notes (Signed)
 Nutrition Follow-up  DOCUMENTATION CODES:  Obesity unspecified  INTERVENTION:  Given increased nutrition needs secondary to recent surgical procedures and presence of wounds,pt is at elevated nutrition risk.  Recommend initiation of TPN if unable to advance diet within 3 days.   When medically feasible to resume oral nutrition, recommend: Dysphagia 3 diet for ease of chewing Ensure Plus High Protein po daily, each supplement provides 350 kcal and 20 grams of protein. Juven BID to support wound healing  NUTRITION DIAGNOSIS:  Increased nutrient needs related to acute illness, wound healing as evidenced by estimated needs  GOAL:  Patient will meet greater than or equal to 90% of their needs  MONITOR:  Diet advancement, Labs, Weight trends, I & O's, Skin  REASON FOR ASSESSMENT:  Rounds    ASSESSMENT:  Pt admitted after a recent fall leading to L distal femur fracture s/p L femoral nailing. PMH significant for perforated sigmoid colon s/p Hartmann procedure, s/p colostomy revision, HTN.  6/20: s/p L femoral nail 6/23: perforated bleeding duodenal ulcer; s/p exlap with graham patch repair  Pt discussed in rounds.  Self extubated this morning.  Trying to come off pressors.  Hold on TPN initiation today and allow time for return of bowel function/ability to advance diet.   Per discussion with Surgery today, no plans for TPN today as pt is post-op day one. Plan for upper GI on post-op day 3 and if normal, will start diet.  NGT to suction. Noted black appearing output from tube.  Per RN, no ostomy output yet.   Spoke with pt at bedside. She states that she was not eating well during admission at [facility]. She feels that her appetite was declining about 2-3 weeks PTA.   Called and spoke with pt's husband via phone call to obtain more detailed nutrition related history. He states that over the last few weeks PTA, she was eating lightly, mentioning not a whole lot of anything. She refers  to himself as a short order cook and prepares food for her when she is up to eating.   Pt and husband both report that pt has lost her lost dentures during admission. Per notes, she lost them 3 days ago. She has been reliant on softer texture foods. Meal completions have averaged 56% (6/21-6/22). She has been eating peanut butter and jelly sandwiches and cream of chicken soup as these have been easy to tolerate.   Drains/lines: UOP: x24 hours RUQ colostomy Midline abdominal JP drain: 40ml x12 hours A-line  Suspect documented admission weights are likely estimated/stated.  Review of weight history on file reflects pt's weight has been trending down >1 year. Between 09/02/23-05/31/24, pt appears to have had a weight loss of 13.1% which is not clinically significant for time frame though is concerning.   Medications/Drips: D5 in LR @ 125ml/hr Levo @ 3 mcg/min Abx Vaso @ 0.03 units/min  Labs:   Sodium 134 BUN 84 Cr 1.56 Albumin <1.5 AST 70 GFR 36 CBG's 73-122 x24 hours  NUTRITION - FOCUSED PHYSICAL EXAM: Given recent weight loss and variable oral intake, suspect pt with a degree of  Flowsheet Row Most Recent Value  Orbital Region No depletion  Upper Arm Region No depletion  Thoracic and Lumbar Region No depletion  Buccal Region No depletion  Temple Region No depletion  Clavicle Bone Region No depletion  Clavicle and Acromion Bone Region No depletion  Scapular Bone Region No depletion  Dorsal Hand Unable to assess  [severe edema]  Patellar Region  Unable to assess  [moderate pitting edema]  Anterior Thigh Region Unable to assess  Posterior Calf Region Unable to assess  Edema (RD Assessment) Severe  [BUE]  Hair Reviewed  Eyes Reviewed  Mouth Other (Comment)  [poor dentition]  Skin Reviewed  Nails Reviewed   Diet Order:   Diet Order             Diet NPO time specified  Diet effective now                   EDUCATION NEEDS:  Not appropriate for education  at this time  Skin:  Skin Assessment: Skin Integrity Issues: Skin Integrity Issues:: Stage III, Stage II, Other (Comment) Stage II: sacrum Stage III: mid coccyx Other: IAD L buttock, L groin, abdomen, bilateral sacrum, vagina  Last BM:  6/23 via colostomy prior to Surgery  Height:  Ht Readings from Last 1 Encounters:  06/15/24 5' 5 (1.651 m)    Weight:  Wt Readings from Last 1 Encounters:  06/15/24 105 kg    Ideal Body Weight:  56.8 kg  BMI:  Body mass index is 38.52 kg/m.  Estimated Nutritional Needs:   Kcal:  1600-1800  Protein:  85-100g  Fluid:  >/=1.6L  Allie Aymar Whitfill, RDN, LDN Clinical Nutrition See AMiON for contact information.

## 2024-06-16 NOTE — Progress Notes (Signed)
 RT called to unit for patient in 3M11 and upon arrival RT was notified that 3M09 had self extubated. Patient was placed on wean @0754 . Vitals stable. Patient alert, oriented and able to speak. RT will continue to monitor.

## 2024-06-16 NOTE — Progress Notes (Signed)
 NAME:  Christina Castaneda, MRN:  [id number], DOB:  1956/09/04, LOS: 9 ADMISSION DATE:  06/07/2024, CONSULTATION DATE:  06/15/24 REFERRING MD:  Vicci, CHIEF COMPLAINT:  pneumoperitoneum // shock   History of Present Illness:   68 yo F PMH perf sigmoid colon s/p hartmann, colostomy revision, HTN who was admitted to TRH at [facility] 6/15 after presenting for a fall. Found to have a L distal femur fx related to the fall. On admission,  Labs significant for AKI w hyperK. She was medically optimized and then underwent retrograde L femoral nail.  On 6/23 overnight she had a drop in hgb to 6.2 -- associated dark ostomy output and increasing abdominal pain. Transfused. Subsequent hypotension requiring pressor initiation. Went for CTA which was concerning for perforated bleeding ulcer with air bubbles adjacent to duodenal bulb and layering of high attenuation material c/f active extrav.  CCS was consulted and pt went to OR for ex lap, on NE and vaso, having received 3 PRBC 1 plt  PCCM is consulted pre-op for transfer to [place] post operatively   Pertinent  Medical History  Prior sigmoid perf s/p hartmanns Colostomy revision HTN Hypothyroidism   Significant Hospital Events: Including procedures, antibiotic start and stop dates in addition to other pertinent events   6/15 admit to TRH after fall days earlier, L femur fx and AKI 6/20 OR w ortho 6/23 hgb drop, shock -- found to have perforated bleeding duodenal ulcer.  6/24 SBT   Interim History / Subjective:  POD1 ex lap graham patch repair  Weaning on vent this morning PSV 8/5 40%  Objective    Blood pressure (!) 128/45, pulse (!) 59, temperature 97.9 F (36.6 C), temperature source Axillary, resp. rate 15, height 5' 5 (1.651 m), weight 105 kg, last menstrual period 11/18/2012, SpO2 100%.    Vent Mode: CPAP;PSV FiO2 (%):  [40 %-50 %] 40 % Set Rate:  [20 bmp] 20 bmp Vt Set:  [460 mL] 460 mL PEEP:  [5 cmH20] 5 cmH20 Pressure Support:  [8 cmH20] 8  cmH20 Plateau Pressure:  [15 cmH20-16 cmH20] 16 cmH20   Intake/Output Summary (Last 24 hours) at 06/16/2024 0918 Last data filed at 06/16/2024 9374 Gross per 24 hour  Intake 5020.41 ml  Output 1440 ml  Net 3580.41 ml   Filed Weights   06/07/24 1700 06/12/24 1119 06/15/24 1209  Weight: 105 kg 105 kg 105 kg    Examination: General:  chronically and critically ill morbidly obese F  HEENT: NCAT ETT secure NGT secure  Neuro: Lightly sedated, awakens and follows commands  CV rrr distant heart tones cap refill < 3 sec  PULM: mechanically ventilated, diminished breath sounds, even unlabored with Vt >700 on PSV 5/5  GI: obese soft. Surgical site dressed, JP w scant red output. Ostomy.  Skin: pale clean   Resolved problem list   Assessment and Plan    Septic shock  Pneumoperitoneum Perf bleeding duodenal ulcer s/p ex lap graham patch repair and LOA (6/23)  Hx of Colostomy and colocutaneous fistula  P -post op per CCS -I think we can narrow from vanc fluc zosyn just to zosyn   -NPO, cont NGT. Might need TPN per [facility] CCS note  -- will d/w [facility] team  -wean pressors. Her diastolics are low, will target a SBP goal >100   Post op vent management AoC hypoxic resp failure -noct O2 at home COPD OSA on CPAP Tobacco use P -WUA/SBT hopeful extubation 6/24  -Bds -O2 goal > 88  ABLA  - bleeding ulcer perf, surgical losses P -goal > 7 -PPI  Fall L femur fx s/p retrograde L femoral nail on 6/20 P -appreciate ortho recs 6/21 -PT when appropriate  AKI, improving Urinary retention  P -replace mag -follow renal indices, UOP  -cont foley for now -- wont be able to give retention meds as she is NPO   Mild hypoglycemia P -cbg monitoring  Hypothyroidism P -synthroid  on hold while npo -- will need to convert to IV in the next couple of days   HLD HTN P -statin on hold while npo -hold anti-htn meds while hypotensive  Stage 3 sacral pressure injury POA Plan: -woc  consult   Best Practice (right click and Reselect all SmartList Selections daily)   Diet/type: NPO DVT prophylaxis SCD Pressure ulcer(s): present on admission  GI prophylaxis: PPI Lines: Central line and Arterial Line Foley:  Yes, and it is still needed Code Status:  full code Last date of multidisciplinary goals of care discussion [6/23 updated spouse Randall over phone]  Labs   CBC: Recent Labs  Lab 06/12/24 0426 06/13/24 0403 06/14/24 0309 06/14/24 1941 06/14/24 2347 06/15/24 0154 06/15/24 1142 06/15/24 2127 06/15/24 2142  WBC 14.9* 21.9* 18.1*  --   --   --  23.2*  --  27.6*  HGB 10.4* 8.6* 7.3*   < > 6.2* 6.3* 9.9* 7.8* 8.7*  HCT 34.6* 28.7* 23.3*   < > 19.8* 20.2* 28.9* 23.0* 25.9*  MCV 102.4* 102.9* 101.3*  --   --   --  96.3  --  95.2  PLT 104* 64* PLATELETS APPEAR DECREASED  --   --   --  149*  --  166   < > = values in this interval not displayed.    Basic Metabolic Panel: Recent Labs  Lab 06/10/24 0423 06/11/24 0421 06/11/24 0451 06/12/24 9573 06/12/24 9187 06/13/24 0403 06/14/24 0309 06/15/24 0154 06/15/24 2127 06/15/24 2142  NA 139   < >  --  137  --  134* 134* 136 136 135  K 4.9   < >  --  5.6*   < > 5.5* 4.4 4.7 4.6 4.6  CL 109   < >  --  109  --  106 104 102  --  106  CO2 24   < >  --  25  --  24 25 21*  --  22  GLUCOSE 79   < >  --  95  --  111* 82 112*  --  129*  BUN 71*   < >  --  43*  --  36* 45* 72*  --  84*  CREATININE 2.02*   < >  --  1.44*  --  1.27* 1.18* 1.29*  --  1.54*  CALCIUM  8.3*   < >  --  8.9  --  8.3* 8.5* 8.9  --  7.9*  MG 1.6*  --  2.0  --   --   --  1.5* 2.2  --  1.8   < > = values in this interval not displayed.   GFR: Estimated Creatinine Clearance: 40.9 mL/min (A) (by C-G formula based on SCr of 1.54 mg/dL (H)). Recent Labs  Lab 06/13/24 0403 06/14/24 0309 06/15/24 1142 06/15/24 2142  WBC 21.9* 18.1* 23.2* 27.6*    Liver Function Tests: Recent Labs  Lab 06/15/24 2142  AST 93*  ALT 41  ALKPHOS 39   BILITOT 0.9  PROT 3.8*  ALBUMIN <1.5*  No results for input(s): LIPASE, AMYLASE in the last 168 hours. No results for input(s): AMMONIA in the last 168 hours.  ABG    Component Value Date/Time   PHART 7.365 06/15/2024 2127   PCO2ART 38.2 06/15/2024 2127   PO2ART 134 (H) 06/15/2024 2127   HCO3 22.0 06/15/2024 2127   TCO2 23 06/15/2024 2127   ACIDBASEDEF 3.0 (H) 06/15/2024 2127   O2SAT 99 06/15/2024 2127     Coagulation Profile: Recent Labs  Lab 06/15/24 2142  INR 1.7*    Cardiac Enzymes: No results for input(s): CKTOTAL, CKMB, CKMBINDEX, TROPONINI in the last 168 hours.  HbA1C: No results found for: HGBA1C  CBG: Recent Labs  Lab 06/14/24 1128 06/14/24 1747 06/15/24 1926 06/15/24 2321 06/16/24 0803  GLUCAP 88 95 122* 103* 101*    CRITICAL CARE Performed by: Ronnald FORBES Gave   Total critical care time: 40 minutes  Critical care time was exclusive of separately billable procedures and treating other patients. Critical care was necessary to treat or prevent imminent or life-threatening deterioration.  Critical care was time spent personally by me on the following activities: development of treatment plan with patient and/or surrogate as well as nursing, discussions with consultants, evaluation of patient's response to treatment, examination of patient, obtaining history from patient or surrogate, ordering and performing treatments and interventions, ordering and review of laboratory studies, ordering and review of radiographic studies, pulse oximetry and re-evaluation of patient's condition.  Ronnald Gave MSN, AGACNP-BC [facility]/Critical Care Medicine Amion for pager  06/16/2024, 9:18 AM

## 2024-06-17 DIAGNOSIS — A419 Sepsis, unspecified organism: Secondary | ICD-10-CM | POA: Diagnosis not present

## 2024-06-17 DIAGNOSIS — R6521 Severe sepsis with septic shock: Secondary | ICD-10-CM | POA: Diagnosis not present

## 2024-06-17 DIAGNOSIS — J9621 Acute and chronic respiratory failure with hypoxia: Secondary | ICD-10-CM | POA: Diagnosis not present

## 2024-06-17 DIAGNOSIS — K276 Chronic or unspecified peptic ulcer, site unspecified, with both hemorrhage and perforation: Secondary | ICD-10-CM | POA: Diagnosis not present

## 2024-06-17 LAB — CBC
HCT: 19.3 % — ABNORMAL LOW (ref 36.0–46.0)
Hemoglobin: 6.2 g/dL — CL (ref 12.0–15.0)
MCH: 32.5 pg (ref 26.0–34.0)
MCHC: 32.1 g/dL (ref 30.0–36.0)
MCV: 101 fL — ABNORMAL HIGH (ref 80.0–100.0)
Platelets: 125 10*3/uL — ABNORMAL LOW (ref 150–400)
RBC: 1.91 MIL/uL — ABNORMAL LOW (ref 3.87–5.11)
RDW: 16.4 % — ABNORMAL HIGH (ref 11.5–15.5)
WBC: 13.5 10*3/uL — ABNORMAL HIGH (ref 4.0–10.5)
nRBC: 0.2 % (ref 0.0–0.2)

## 2024-06-17 LAB — COMPREHENSIVE METABOLIC PANEL WITH GFR
ALT: 23 U/L (ref 0–44)
AST: 39 U/L (ref 15–41)
Albumin: <1.5 g/dL — ABNORMAL LOW (ref 3.5–5.0)
Alkaline Phosphatase: 33 U/L — ABNORMAL LOW (ref 38–126)
Anion gap: 4 — ABNORMAL LOW (ref 5–15)
BUN: 68 mg/dL — ABNORMAL HIGH (ref 8–23)
CO2: 25 mmol/L (ref 22–32)
Calcium: 7.5 mg/dL — ABNORMAL LOW (ref 8.9–10.3)
Chloride: 107 mmol/L (ref 98–111)
Creatinine, Ser: 1.44 mg/dL — ABNORMAL HIGH (ref 0.44–1.00)
GFR, Estimated: 39 mL/min — ABNORMAL LOW (ref >60)
Glucose, Bld: 88 mg/dL (ref 70–99)
Potassium: 3.7 mmol/L (ref 3.5–5.1)
Sodium: 136 mmol/L (ref 135–145)
Total Bilirubin: 0.8 mg/dL (ref 0.0–1.2)
Total Protein: 3.7 g/dL — ABNORMAL LOW (ref 6.5–8.1)

## 2024-06-17 LAB — GLUCOSE, CAPILLARY
Glucose-Capillary: 78 mg/dL (ref 70–99)
Glucose-Capillary: 80 mg/dL (ref 70–99)
Glucose-Capillary: 82 mg/dL (ref 70–99)
Glucose-Capillary: 87 mg/dL (ref 70–99)

## 2024-06-17 LAB — PREPARE RBC (CROSSMATCH)

## 2024-06-17 LAB — HEMOGLOBIN AND HEMATOCRIT, BLOOD
HCT: 20.4 % — ABNORMAL LOW (ref 36.0–46.0)
Hemoglobin: 6.7 g/dL — CL (ref 12.0–15.0)

## 2024-06-17 LAB — TYPE AND SCREEN: Antibody Screen: NEGATIVE

## 2024-06-17 LAB — CULTURE, BLOOD (ROUTINE X 2)

## 2024-06-17 MED ORDER — SODIUM CHLORIDE 0.9% IV SOLUTION: Freq: Once | INTRAVENOUS | Status: DC

## 2024-06-17 MED ORDER — POTASSIUM CHLORIDE 10 MEQ/100ML IV SOLN
10.0000 meq | INTRAVENOUS | Status: AC
  Administered 2024-06-17 (×2): 10 meq via INTRAVENOUS
  Filled 2024-06-17 (×2): qty 100

## 2024-06-17 MED ORDER — SODIUM CHLORIDE 0.9% IV SOLUTION: Freq: Once | INTRAVENOUS | Status: AC

## 2024-06-17 MED ORDER — DEXTROSE IN LACTATED RINGERS 5 % IV SOLN: INTRAVENOUS | Status: DC

## 2024-06-17 NOTE — Progress Notes (Addendum)
 2 Days Post-Op  Subjective: Patient feeling better today despite hgb 6.2.  colostomy working.  NGT with minimal output  ROS: See above, otherwise other systems negative  Objective: Vital signs in last 24 hours: Temp:  [97.7 F (36.5 C)-98.1 F (36.7 C)] 98.1 F (36.7 C) (06/25 0904) Pulse Rate:  [50-77] 64 (06/25 1000) Resp:  [10-25] 15 (06/25 1000) BP: (88-139)/(42-67) 113/60 (06/25 1000) SpO2:  [94 %-100 %] 100 % (06/25 1000) Arterial Line BP: (58-142)/(32-138) 84/76 (06/25 0400) FiO2 (%):  [32 %] 32 % (06/25 0728) Last BM Date : 06/15/24  Intake/Output from previous day: 06/24 0701 - 06/25 0700 In: 3507 [I.V.:3293.5; IV Piggyback:213.5] Out: 1860 [Urine:1815; Drains:45] Intake/Output this shift: Total I/O In: 415.6 [I.V.:387.4; IV Piggyback:28.2] Out: 350 [Urine:300; Emesis/NG output:50]  PE: Abd: soft, appropriately tender, midline wound clean and honeycomb in place, JP with serosang output, colostomy has been recently emptied.  NGT with almost no output and is not blood.  No blood really noted in colostomy either  Lab Results:  Recent Labs    06/16/24 0415 06/17/24 0425  WBC 25.1* 13.5*  HGB 8.2* 6.2*  HCT 25.2* 19.3*  PLT 152 125*   BMET Recent Labs    06/16/24 0415 06/17/24 0425  NA 134* 136  K 4.6 3.7  CL 107 107  CO2 22 25  GLUCOSE 118* 88  BUN 84* 68*  CREATININE 1.56* 1.44*  CALCIUM  7.5* 7.5*   PT/INR Recent Labs    06/15/24 2142  LABPROT 19.9*  INR 1.7*   CMP     Component Value Date/Time   NA 136 06/17/2024 0425   K 3.7 06/17/2024 0425   CL 107 06/17/2024 0425   CO2 25 06/17/2024 0425   GLUCOSE 88 06/17/2024 0425   BUN 68 (H) 06/17/2024 0425   CREATININE 1.44 (H) 06/17/2024 0425   CALCIUM  7.5 (L) 06/17/2024 0425   PROT 3.7 (L) 06/17/2024 0425   ALBUMIN <1.5 (L) 06/17/2024 0425   AST 39 06/17/2024 0425   ALT 23 06/17/2024 0425   ALKPHOS 33 (L) 06/17/2024 0425   BILITOT 0.8 06/17/2024 0425   GFRNONAA 39 (L) 06/17/2024  0425   GFRAA >60 06/05/2016 1320   Lipase     Component Value Date/Time   LIPASE 64 (H) 03/28/2016 1705       Studies/Results: DG Chest Port 1 View Result Date: 06/15/2024 CLINICAL DATA:  Endotracheal tube EXAM: PORTABLE CHEST 1 VIEW COMPARISON:  Chest x-ray 06/15/2024 FINDINGS: Right-sided central venous catheter tip projects over the mid SVC. Endotracheal tube tip is 3.5 cm above the carina. Enteric tube extends below the diaphragm. There are left basilar airspace opacities. Costophrenic angles are now clear. There is no pneumothorax. Cardiomediastinal silhouette is stable, the heart is mildly enlarged. Osseous structures are unchanged. IMPRESSION: 1. Left basilar airspace opacities, atelectasis versus pneumonia. 2. Support apparatus as above. Electronically Signed   By: Greig Pique M.D.   On: 06/15/2024 20:00   DG Chest Port 1 View Result Date: 06/15/2024 CLINICAL DATA:  Endotracheal tube EXAM: PORTABLE CHEST 1 VIEW COMPARISON:  Chest x-ray 06/15/2024 FINDINGS: The endotracheal tube tip is 2 cm above the carina. Right-sided central venous catheter tip projects over the distal SVC. Enteric tube extends below the diaphragm. A small left pleural effusion is present with some patchy left basilar opacities. The right lung is clear. There is no pneumothorax or acute fracture. IMPRESSION: 1. Endotracheal tube tip 2 cm above the carina. 2. Small left pleural effusion with  patchy left basilar opacities, atelectasis versus pneumonia. Electronically Signed   By: Greig Pique M.D.   On: 06/15/2024 16:54    Anti-infectives: Anti-infectives (From admission, onward)    Start     Dose/Rate Route Frequency Ordered Stop   06/16/24 1800  vancomycin  (VANCOCIN ) IVPB 1000 mg/200 mL premix  Status:  Discontinued        1,000 mg 200 mL/hr over 60 Minutes Intravenous Every 24 hours 06/15/24 1317 06/15/24 1328   06/15/24 1700  vancomycin  (VANCOREADY) IVPB 2000 mg/400 mL  Status:  Discontinued        2,000  mg 200 mL/hr over 120 Minutes Intravenous  Once 06/15/24 1317 06/15/24 1328   06/15/24 1600  fluconazole (DIFLUCAN) IVPB 400 mg  Status:  Discontinued        400 mg 100 mL/hr over 120 Minutes Intravenous Every 24 hours 06/15/24 1317 06/16/24 0914   06/15/24 1500  metroNIDAZOLE  (FLAGYL ) IVPB 500 mg  Status:  Discontinued        500 mg 100 mL/hr over 60 Minutes Intravenous Every 12 hours 06/15/24 1216 06/15/24 1255   06/15/24 1400  ciprofloxacin  (CIPRO ) IVPB 400 mg  Status:  Discontinued        400 mg 200 mL/hr over 60 Minutes Intravenous Every 12 hours 06/15/24 1216 06/15/24 1255   06/15/24 1400  piperacillin-tazobactam (ZOSYN) IVPB 3.375 g        3.375 g 12.5 mL/hr over 240 Minutes Intravenous Every 8 hours 06/15/24 1317     06/15/24 1400  vancomycin  (VANCOCIN ) IVPB 1000 mg/200 mL premix  Status:  Discontinued        1,000 mg 200 mL/hr over 60 Minutes Intravenous Every 24 hours 06/15/24 1328 06/16/24 0914   06/15/24 1333  vancomycin  (VANCOCIN ) 1-5 GM/200ML-% IVPB       Note to Pharmacy: Romona Shaver E: cabinet override      06/15/24 1333 06/15/24 1410   06/15/24 1221  ciprofloxacin  (CIPRO ) 400 MG/200ML IVPB  Status:  Discontinued       Note to Pharmacy: Dannielle Railing S: cabinet override      06/15/24 1221 06/15/24 1343   06/15/24 1221  metroNIDAZOLE  (FLAGYL ) 500 MG/100ML IVPB  Status:  Discontinued       Note to Pharmacy: Dannielle Railing S: cabinet override      06/15/24 1221 06/15/24 1343   06/12/24 1223  clindamycin  (CLEOCIN ) 900 MG/50ML IVPB       Note to Pharmacy: Elaine Nest L: cabinet override      06/12/24 1223 06/12/24 1332   06/12/24 1131  ceFAZolin  (ANCEF ) 2-4 GM/100ML-% IVPB  Status:  Discontinued       Note to Pharmacy: Billy Sluder H: cabinet override      06/12/24 1131 06/12/24 1434   06/11/24 1300  clindamycin  (CLEOCIN ) IVPB 900 mg  Status:  Discontinued        900 mg 100 mL/hr over 30 Minutes Intravenous On call to O.R. 06/10/24 1041 06/10/24 1447    06/11/24 1300  ceFAZolin  (ANCEF ) IVPB 2g/100 mL premix        2 g 200 mL/hr over 30 Minutes Intravenous On call to O.R. 06/10/24 1447 06/12/24 1908        Assessment/Plan POD 2, s/p ex lap with graham patch repair and JP drain placement for Perforated duodenal ulcer, possible bleeding ulcer as well, Dr. Johanna at [facility] 6/23  -looks well today -getting blood today for hgb of 6.2.  no evidence of active bleeding.  NGT is bilious,  colostomy with no blood, and JP serosang - no active bleeding.  If she were to start actively bleeding would likely need IR eval for angioembo -cont abx therapy, WBC much improved to 13K -h. Pylori test pending -multi-modal pain control -PT, OOB as able pending femur fx recs -likely UGI tomorrow -surgically stable to tx out of unit -d/w primary service on ward  FEN - NPO/NGT VTE - on hold due to anemia ID - zosyn  I reviewed Consultant CCM notes, last 24 h vitals and pain scores, last 48 h intake and output, last 24 h labs and trends, and last 24 h imaging results.   LOS: 10 days    Burnard FORBES Banter , [facility] 06/17/2024, 11:00 AM Please see Amion for pager number during day hours 7:00am-4:30pm or 7:00am -11:30am on weekends

## 2024-06-17 NOTE — Progress Notes (Signed)
 Post transfusion h/h 6.7  Nothing clinically suggestive of an active bleed  Additional 1 PRBC, h/h post transfusion   D/w ccs    Ronnald Gave MSN, AGACNP-BC [facility] Pulmonary/Critical Care Medicine 06/17/2024, 4:18 PM

## 2024-06-17 NOTE — Evaluation (Signed)
 Occupational Therapy Evaluation Patient Details Name: Christina Castaneda MRN: [id number] DOB: [birth year] Today's Date: 06/17/2024   History of Present Illness   Pt is a 68 y/o F presenting to [facility] ED on 6/15 after fall, found to have L femoral neck fx and AKI. S/p retrograde IMN on 6/20. Hgb drop on 6/23 with dark ostomy output, transfused, subsequent hypotension requiring pressors. Found to have perforated bleeding duodenal ulcer s/p ex lap on 6/23. Transferred to [facility] for PCCM. PMH includes HTN, hypothyroidism.     Clinical Impressions Pt reports sponge bathing at baseline, used a SPC or RW for mobility, lived with spouse. Pt currently limited by abdominal pain, anxious with anticipation of pain and mobility. Pt needing mod-max A for bed level ADLs, max +2 for bed mobility, pt coming to partial sitting at EOB with RLE on floor, however then reporting incr abdominal pain and requesting to return to supine. Pt educated on bracing with pillow with mobility and coughing to reduce pain. Pt presenting with impairments listed below, will follow acutely. Patient will benefit from continued inpatient follow up therapy, <3 hours/day to maximize safety/ind with ADL/functional mobility.      If plan is discharge home, recommend the following:   A lot of help with bathing/dressing/bathroom;Assistance with cooking/housework;Assist for transportation;Help with stairs or ramp for entrance;Two people to help with walking and/or transfers     Functional Status Assessment   Patient has had a recent decline in their functional status and demonstrates the ability to make significant improvements in function in a reasonable and predictable amount of time.     Equipment Recommendations   Other (comment) (defer)     Recommendations for Other Services   PT consult     Precautions/Restrictions   Precautions Precautions: Knee;Fall Recall of Precautions/Restrictions: Intact Precaution/Restrictions  Comments: JP drain, NGT Required Braces or Orthoses: Knee Immobilizer - Left Restrictions Weight Bearing Restrictions Per Provider Order: Yes LLE Weight Bearing Per Provider Order: Weight bearing as tolerated     Mobility Bed Mobility Overal bed mobility: Needs Assistance Bed Mobility: Supine to Sit, Sit to Supine     Supine to sit: Max assist, +2 for physical assistance Sit to supine: Max assist, +2 for physical assistance   General bed mobility comments: pt with R foot on floor unnable to get L foot on floor due to pt leaning posteriorly and requesting to return to supine due to abdominal pain    Transfers                   General transfer comment: pt declienes      Balance Overall balance assessment: Needs assistance   Sitting balance-Leahy Scale: Poor                                     ADL either performed or assessed with clinical judgement   ADL Overall ADL's : Needs assistance/impaired Eating/Feeding: Set up;Bed level   Grooming: Set up;Bed level   Upper Body Bathing: Moderate assistance;Bed level;Sitting   Lower Body Bathing: Maximal assistance;Sitting/lateral leans;Bed level   Upper Body Dressing : Moderate assistance;Sitting;Bed level   Lower Body Dressing: Maximal assistance;Sitting/lateral leans;Bed level   Toilet Transfer: Maximal assistance;+2 for physical assistance                   Vision   Vision Assessment?: No apparent visual deficits     Perception Perception: Not  tested       Praxis Praxis: Not tested       Pertinent Vitals/Pain Pain Assessment Pain Assessment: Faces Pain Score: 5  Faces Pain Scale: Hurts even more Pain Location: abdomen Pain Descriptors / Indicators: Sore, Sharp Pain Intervention(s): Limited activity within patient's tolerance, Monitored during session, Repositioned     Extremity/Trunk Assessment Upper Extremity Assessment Upper Extremity Assessment: Overall WFL for tasks  assessed   Lower Extremity Assessment Lower Extremity Assessment: Defer to PT evaluation       Communication Communication Communication: No apparent difficulties   Cognition Arousal: Alert Behavior During Therapy: WFL for tasks assessed/performed Cognition: No apparent impairments                               Following commands: Intact       Cueing  General Comments   Cueing Techniques: Verbal cues  VSS   Exercises     Shoulder Instructions      Home Living Family/patient expects to be discharged to:: Private residence Living Arrangements: Spouse/significant other Available Help at Discharge: Family;Available PRN/intermittently Type of Home: House Home Access: Ramped entrance     Home Layout: One level     Bathroom Shower/Tub: Sponge bathes at baseline;Tub/shower unit   Bathroom Toilet: Handicapped height Bathroom Accessibility: Yes   Home Equipment: Agricultural consultant (2 wheels);Cane - single point;Shower seat;BSC/3in1          Prior Functioning/Environment Prior Level of Function : Independent/Modified Independent             Mobility Comments: SPC and RW for household ambulation ADLs Comments: sponge bathes    OT Problem List: Decreased strength;Decreased range of motion;Decreased activity tolerance;Impaired balance (sitting and/or standing);Obesity;Pain;Impaired UE functional use   OT Treatment/Interventions: Self-care/ADL training;Therapeutic exercise;DME and/or AE instruction;Energy conservation;Therapeutic activities;Patient/family education;Balance training      OT Goals(Current goals can be found in the care plan section)   Acute Rehab OT Goals Patient Stated Goal: to get better and get out of here OT Goal Formulation: With patient Time For Goal Achievement: 07/01/24 Potential to Achieve Goals: Good ADL Goals Pt Will Perform Upper Body Dressing: with supervision;sitting Pt Will Perform Lower Body Dressing: with mod  assist;with adaptive equipment;sitting/lateral leans;sit to/from stand Pt Will Transfer to Toilet: regular height toilet;ambulating;with min assist Additional ADL Goal #1: pt will perform bed mobility min A in prep for ADLs   OT Frequency:  Min 2X/week    Co-evaluation PT/OT/SLP Co-Evaluation/Treatment: Yes Reason for Co-Treatment: To address functional/ADL transfers   OT goals addressed during session: ADL's and self-care      AM-PAC OT 6 Clicks Daily Activity     Outcome Measure Help from another person eating meals?: A Little Help from another person taking care of personal grooming?: A Little Help from another person toileting, which includes using toliet, bedpan, or urinal?: A Lot Help from another person bathing (including washing, rinsing, drying)?: A Lot Help from another person to put on and taking off regular upper body clothing?: A Lot Help from another person to put on and taking off regular lower body clothing?: A Lot 6 Click Score: 14   End of Session Nurse Communication: Mobility status  Activity Tolerance: Patient tolerated treatment well Patient left: in bed;with call bell/phone within reach;with bed alarm set  OT Visit Diagnosis: Unsteadiness on feet (R26.81);Other abnormalities of gait and mobility (R26.89);Muscle weakness (generalized) (M62.81);Pain  Time: 8651-8585 OT Time Calculation (min): 26 min Charges:  OT General Charges $OT Visit: 1 Visit OT Evaluation $OT Eval Moderate Complexity: 1 Mod  Latunya Kissick K, OTD, OTR/L SecureChat Preferred Acute Rehab [phone]   Laneta POUR Koonce 06/17/2024, 3:18 PM

## 2024-06-17 NOTE — Progress Notes (Addendum)
 NAME:  Christina Castaneda, MRN:  [id number], DOB:  06-Jul-1956, LOS: 10 ADMISSION DATE:  06/07/2024, CONSULTATION DATE:  06/15/24 REFERRING MD:  Vicci, CHIEF COMPLAINT:  pneumoperitoneum // shock   History of Present Illness:   68 yo F PMH perf sigmoid colon s/p hartmann, colostomy revision, HTN who was admitted to [facility] at [facility] 6/15 after presenting for a fall. Found to have a L distal femur fx related to the fall. On admission,  Labs significant for AKI w hyperK. She was medically optimized and then underwent retrograde L femoral nail.  On 6/23 overnight she had a drop in hgb to 6.2 -- associated dark ostomy output and increasing abdominal pain. Transfused. Subsequent hypotension requiring pressor initiation. Went for CTA which was concerning for perforated bleeding ulcer with air bubbles adjacent to duodenal bulb and layering of high attenuation material c/f active extrav.  CCS was consulted and pt went to OR for ex lap, on NE and vaso, having received 3 PRBC 1 plt  PCCM is consulted pre-op for transfer to [place] post operatively   Pertinent  Medical History  Prior sigmoid perf s/p hartmanns Colostomy revision HTN Hypothyroidism   Significant Hospital Events: Including procedures, antibiotic start and stop dates in addition to other pertinent events   6/15 admit to [facility] after fall days earlier, L femur fx and AKI 6/20 OR w ortho 6/23 hgb drop, shock -- found to have perforated bleeding duodenal ulcer  6/24 extubated 6/25 off pressors, txf out of ICU   Interim History / Subjective:  Off pressors   CBGs  70-100 on 125 d5LR    Cr 1.44  WBC improved from 25 to 13  Hgb 6.2 plt 125   Objective    Blood pressure (!) 121/59, pulse 65, temperature 97.9 F (36.6 C), temperature source Oral, resp. rate 17, height 5' 5 (1.651 m), weight 105 kg, last menstrual period 11/18/2012, SpO2 100%.    FiO2 (%):  [32 %] 32 %   Intake/Output Summary (Last 24 hours) at 06/17/2024 0754 Last data filed at  06/17/2024 0650 Gross per 24 hour  Intake 3359.46 ml  Output 1860 ml  Net 1499.46 ml   Filed Weights   06/07/24 1700 06/12/24 1119 06/15/24 1209  Weight: 105 kg 105 kg 105 kg    Examination: General:  chronically and acutely ill F  HEENT: NCAT NGT secure  Neuro: awake and follows commands  PULM: even unlabored, shallow unlabored respirations  CV: rr cap refill < 3 sec  GI: Obese soft. + ostomy. Surgical site dressed. 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 NGT LIMW. If unable to adv diet in the next few days consider TPN -h pylori testing ordered by CCS  -PPI  Chronic hypoxic respiratory failure  COPD OSA on CPAP  Tobacco use  P -pulm hygiene -O2 for goal > 88  - -at baseline she uses O2 at night   Anemia -- ABLA + critical illness  Thrombocytopenia, mild  - bleeding ulcer perf, surgical losses initially. Do not think hgb 6/24 is bleeding related. Think relative marrow suppression, iatrogenic losses.  P -goal > 7 -- getting 1 PRBC  6/24. Doesn't seem to have over losses  -PPI -d/w CCS -- if she were to bleed, would need IR   Fall L femur fx s/p retrograde L femoral nail on 6/20 P -appreciate ortho recs 6/21 -PT when appropriate  AKI, improving Urinary retention  P -follow renal indices, UOP  -optimize lytes  -cont foley for now -- wont be able to give retention meds as she is NPO   Mild hypoglycemia P -incr d5LR to 150/hr and cont CBG monitoring   Hypothyroidism P -synthroid  on hold while npo -- will need to convert to IV in the next couple of days   HLD HTN P -statin on hold while npo -holding antihypertensives, resume (IV as  she is NPO)   Stage 3 sacral pressure injury POA Plan: -woc consult   Best Practice (right click and Reselect all SmartList Selections daily)   Diet/type: NPO DVT prophylaxis SCD Pressure ulcer(s): present on admission  GI prophylaxis: PPI Lines: Central line she doesn't have a need for this right now. I will d/w CCS. If they think she is going to need TPN I can leave it, if not I'll dc if when we have adequate PIV access Foley:  Yes, and it is still needed Code Status:  full code Last date of multidisciplinary goals of care discussion [6/23 updated spouse Sheena over phone]  Dispo: stable to transfer out of ICU 6/25. Will ask [facility] to take back over 6/26   Labs   CBC: Recent Labs  Lab 06/14/24 0309 06/14/24 1941 06/15/24 1142 06/15/24 2127 06/15/24 2142 06/16/24 0415 06/17/24 0425  WBC 18.1*  --  23.2*  --  27.6* 25.1* 13.5*  HGB 7.3*   < > 9.9* 7.8* 8.7* 8.2* 6.2*  HCT 23.3*   < > 28.9* 23.0* 25.9* 25.2* 19.3*  MCV 101.3*  --  96.3  --  95.2 95.8 101.0*  PLT PLATELETS APPEAR DECREASED  --  149*  --  166 152 125*   < > = values in this interval not displayed.    Basic Metabolic Panel: Recent Labs  Lab 06/11/24 0451 06/12/24 0426 06/14/24 0309 06/15/24 0154 06/15/24 2127 06/15/24 2142 06/16/24 0415 06/17/24 0425  NA  --    < > 134* 136 136 135 134* 136  K  --    < > 4.4 4.7 4.6 4.6 4.6 3.7  CL  --    < > 104 102  --  106 107 107  CO2  --    < > 25 21*  --  22 22 25   GLUCOSE  --    < > 82 112*  --  129* 118* 88  BUN  --    < > 45* 72*  --  84* 84* 68*  CREATININE  --    < > 1.18* 1.29*  --  1.54* 1.56* 1.44*  CALCIUM   --    < > 8.5* 8.9  --  7.9* 7.5* 7.5*  MG 2.0  --  1.5* 2.2  --  1.8 1.9  --    < > = values in this interval not displayed.   GFR: Estimated Creatinine Clearance: 43.7 mL/min (A) (by C-G formula based on SCr of 1.44 mg/dL (H)). Recent Labs  Lab 06/15/24 1142 06/15/24 2142 06/16/24 0415 06/17/24 0425  WBC 23.2* 27.6* 25.1* 13.5*     Liver Function Tests: Recent Labs  Lab 06/15/24 2142 06/16/24 0415 06/17/24 0425  AST 93* 70* 39  ALT 41 36 23  ALKPHOS  39 41 33*  BILITOT 0.9 0.9 0.8  PROT 3.8* 3.9* 3.7*  ALBUMIN <1.5* <1.5* <1.5*   No results for input(s): LIPASE, AMYLASE in the last 168 hours. No results for input(s): AMMONIA in the last 168 hours.  ABG    Component Value Date/Time   PHART 7.365 06/15/2024 2127   PCO2ART 38.2 06/15/2024 2127   PO2ART 134 (H) 06/15/2024 2127   HCO3 22.0 06/15/2024 2127   TCO2 23 06/15/2024 2127   ACIDBASEDEF 3.0 (H) 06/15/2024 2127   O2SAT 99 06/15/2024 2127     Coagulation Profile: Recent Labs  Lab 06/15/24 2142  INR 1.7*    Cardiac Enzymes: No results for input(s): CKTOTAL, CKMB, CKMBINDEX, TROPONINI in the last 168 hours.  HbA1C: No results found for: HGBA1C  CBG: Recent Labs  Lab 06/16/24 1115 06/16/24 1559 06/16/24 1939 06/16/24 2349 06/17/24 0353  GLUCAP 73 78 100* 106* 80   CCT na    Ronnald Gave MSN, AGACNP-BC [facility] Pulmonary/Critical Care Medicine Amion for pager 06/17/2024, 10:01 AM

## 2024-06-17 NOTE — Evaluation (Signed)
 Physical Therapy Evaluation Patient Details Name: Christina Castaneda MRN: [id number] DOB: 1956/09/05 Today's Date: 06/17/2024  History of Present Illness  Pt is a 68 y/o F presenting to [facility] ED on 6/15 after fall, found to have L femoral neck fx and AKI. S/p retrograde IMN on 6/20. Hgb drop on 6/23 with dark ostomy output, transfused, subsequent hypotension requiring pressors. Found to have perforated bleeding duodenal ulcer s/p ex lap on 6/23. Transferred to [facility] for PCCM. 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  Clinical Impression  Patient presents post abdominal surgery with decreased mobility due to pain, generalized weakness, decreased ROM (L LE) decreased activity tolerance and decreased sitting balance.  Goal was for pt up to EOB though she needed a lot of encouragement and only tolerated briefly endorsed abdominal pain.  Feel she will benefit from continued skilled PT in the acute setting and from post-acute inpatient rehab (<3 hours/day) at d/c.         If plan is discharge home, recommend the following: Two people to help with walking and/or transfers;Two people to help with bathing/dressing/bathroom   Can travel by private vehicle   No    Equipment Recommendations None recommended by PT  Recommendations for Other Services       Functional Status Assessment Patient has had a recent decline in their functional status and demonstrates the ability to make significant improvements in function in a reasonable and predictable amount of time.     Precautions / Restrictions        Mobility  Bed Mobility Overal bed mobility: Needs Assistance Bed Mobility: Supine to Sit, Sit to Supine     Supine to sit: Max assist, +2 for physical assistance Sit to supine: Max assist, +2 for physical assistance   General bed mobility comments: pt with R foot on floor unable to get L foot on floor due to pt leaning posteriorly and requesting to  return to supine due to abdominal pain despite encouragement to sit up further and reduce strain on abdominal musculature    Transfers                        Ambulation/Gait                  Stairs            Wheelchair Mobility     Tilt Bed    Modified Rankin (Stroke Patients Only)       Balance Overall balance assessment: Needs assistance Sitting-balance support: Feet supported, Bilateral upper extremity supported Sitting balance-Leahy Scale: Poor Sitting balance - Comments: unable to maintain upright due to pain though briefly sitting with mod A                                     Pertinent Vitals/Pain Pain Assessment Pain Assessment: Faces Faces Pain Scale: Hurts even more Pain Location: abdomen Pain Descriptors / Indicators: Sore, Sharp Pain Intervention(s): Monitored during session, Repositioned, Limited activity within patient's tolerance    Home Living Family/patient expects to be discharged to:: Private residence Living Arrangements: Spouse/significant other Available Help at Discharge: Family;Available PRN/intermittently Type of Home: House Home Access: Ramped entrance       Home Layout: One level Home Equipment: Agricultural consultant (2 wheels);Cane - single point;Shower seat;BSC/3in1      Prior Function Prior Level of Function :  Independent/Modified Independent             Mobility Comments: SPC and RW for household ambulation ADLs Comments: sponge bathes     Extremity/Trunk Assessment   Upper Extremity Assessment Upper Extremity Assessment: Defer to OT evaluation    Lower Extremity Assessment Lower Extremity Assessment: RLE deficits/detail;LLE deficits/detail RLE Deficits / Details: AAROM grossly WFL though crepitus in knee and pt relates painful; noted strength hip flexion 2-/5, knee extension 3-/5 LLE Deficits / Details: knee immobilizer though pt able to wiggle ankle and perform quad set.     Cervical / Trunk Assessment Cervical / Trunk Assessment: Kyphotic  Communication   Communication Communication: No apparent difficulties    Cognition Arousal: Alert Behavior During Therapy: WFL for tasks assessed/performed   PT - Cognitive impairments: No apparent impairments                         Following commands: Intact       Cueing Cueing Techniques: Verbal cues     General Comments General comments (skin integrity, edema, etc.): VSS on 3L o2    Exercises     Assessment/Plan    PT Assessment Patient needs continued PT services  PT Problem List Decreased strength;Decreased activity tolerance;Decreased balance;Decreased mobility;Decreased knowledge of precautions;Pain;Decreased safety awareness;Decreased knowledge of use of DME       PT Treatment Interventions DME instruction;Gait training;Stair training;Functional mobility training;Therapeutic activities;Therapeutic exercise;Balance training;Patient/family education;Neuromuscular re-education    PT Goals (Current goals can be found in the Care Plan section)  Acute Rehab PT Goals Patient Stated Goal: to get stronger before returning home PT Goal Formulation: With patient Time For Goal Achievement: 07/01/24 Potential to Achieve Goals: Fair    Frequency Min 3X/week     Co-evaluation PT/OT/SLP Co-Evaluation/Treatment: Yes Reason for Co-Treatment: For patient/therapist safety;To address functional/ADL transfers PT goals addressed during session: Mobility/safety with mobility;Balance OT goals addressed during session: ADL's and self-care       AM-PAC PT 6 Clicks Mobility  Outcome Measure Help needed turning from your back to your side while in a flat bed without using bedrails?: Total Help needed moving from lying on your back to sitting on the side of a flat bed without using bedrails?: Total Help needed moving to and from a bed to a chair (including a wheelchair)?: Total Help needed standing  up from a chair using your arms (e.g., wheelchair or bedside chair)?: Total Help needed to walk in hospital room?: Total Help needed climbing 3-5 steps with a railing? : Total 6 Click Score: 6    End of Session Equipment Utilized During Treatment: Gait belt Activity Tolerance: Patient limited by fatigue;Patient limited by pain Patient left: in bed;with call bell/phone within reach Nurse Communication: Mobility status PT Visit Diagnosis: Other abnormalities of gait and mobility (R26.89);History of falling (Z91.81);Muscle weakness (generalized) (M62.81);Pain Pain - part of body:  (abdomen)    Time: 8651-8585 PT Time Calculation (min) (ACUTE ONLY): 26 min   Charges:   PT Evaluation $PT Re-evaluation: 1 Re-eval   PT General Charges $$ ACUTE PT VISIT: 1 Visit         Micheline Portal, PT Acute Rehabilitation Services Office:[phone] 06/17/2024   Montie Portal 06/17/2024, 5:27 PM

## 2024-06-17 NOTE — Progress Notes (Signed)
 eLink Physician-Brief Progress Note Patient Name: Christina Castaneda DOB: 07-Apr-1956 MRN: [id number]   Date of Service  06/17/2024  HPI/Events of Note  Hemoglobin 6.2, no obvious source of hemorrhage.  eICU Interventions  Order entered to transfuse one unit of PRBC.        Itxel Wickard U Martavis Gurney 06/17/2024, 5:00 AM

## 2024-06-17 NOTE — Anesthesia Postprocedure Evaluation (Signed)
 Anesthesia Post Note  Patient: Christina Castaneda  Procedure(s) Performed: LAPAROTOMY, EXPLORATORY OMENTAL GRAHAM PATCH REPAIR. (Abdomen) LAPAROTOMY, FOR LYSIS OF ADHESIONS (Abdomen)  Patient location during evaluation: Phase II Anesthesia Type: General Level of consciousness: awake Pain management: pain level controlled Vital Signs Assessment: post-procedure vital signs reviewed and stable Respiratory status: spontaneous breathing and respiratory function stable Cardiovascular status: blood pressure returned to baseline and stable Postop Assessment: no headache and no apparent nausea or vomiting Anesthetic complications: no Comments: Late entry   No notable events documented.   Last Vitals:  Vitals:   06/17/24 1145 06/17/24 1200  BP: (!) 122/51 (!) 121/55  Pulse: 62 60  Resp: 14 15  Temp: 36.7 C   SpO2: 100% 100%    Last Pain:  Vitals:   06/17/24 1145  TempSrc: Oral  PainSc:                  Christina Castaneda

## 2024-06-17 NOTE — Progress Notes (Signed)
 eLink Physician-Brief Progress Note Patient Name: Christina Castaneda DOB: 06-Jul-1956 MRN: [id number]   Date of Service  06/17/2024  HPI/Events of Note  Hemoglobin 6.2 gm / dl, no evidence of active bleeding.  eICU Interventions  Order entered to transfuse one unit of PRBC.        Christina Castaneda U Christina Castaneda 06/17/2024, 5:04 AM

## 2024-06-18 ENCOUNTER — Other Ambulatory Visit: Payer: Self-pay

## 2024-06-18 ENCOUNTER — Inpatient Hospital Stay (HOSPITAL_COMMUNITY)

## 2024-06-18 DIAGNOSIS — K276 Chronic or unspecified peptic ulcer, site unspecified, with both hemorrhage and perforation: Secondary | ICD-10-CM | POA: Diagnosis not present

## 2024-06-18 LAB — COMPREHENSIVE METABOLIC PANEL WITH GFR
ALT: 26 U/L (ref 0–44)
AST: 52 U/L — ABNORMAL HIGH (ref 15–41)
Albumin: <1.5 g/dL — ABNORMAL LOW (ref 3.5–5.0)
Alkaline Phosphatase: 38 U/L (ref 38–126)
Anion gap: 7 (ref 5–15)
BUN: 44 mg/dL — ABNORMAL HIGH (ref 8–23)
CO2: 25 mmol/L (ref 22–32)
Calcium: 7.7 mg/dL — ABNORMAL LOW (ref 8.9–10.3)
Chloride: 110 mmol/L (ref 98–111)
Creatinine, Ser: 1.3 mg/dL — ABNORMAL HIGH (ref 0.44–1.00)
GFR, Estimated: 44 mL/min — ABNORMAL LOW (ref >60)
Glucose, Bld: 81 mg/dL (ref 70–99)
Potassium: 3.7 mmol/L (ref 3.5–5.1)
Sodium: 142 mmol/L (ref 135–145)
Total Bilirubin: 0.7 mg/dL (ref 0.0–1.2)
Total Protein: 4.2 g/dL — ABNORMAL LOW (ref 6.5–8.1)

## 2024-06-18 LAB — BPAM RBC
Blood Product Expiration Date: 202507232359
Blood Product Expiration Date: 202507232359
ISSUE DATE / TIME: 202506250839
ISSUE DATE / TIME: 202506251524
Unit Type and Rh: 5100
Unit Type and Rh: 5100

## 2024-06-18 LAB — TYPE AND SCREEN
ABO/RH(D): O POS
Antibody Screen: NEGATIVE
Unit division: 0
Unit division: 0

## 2024-06-18 LAB — CBC
HCT: 25.9 % — ABNORMAL LOW (ref 36.0–46.0)
Hemoglobin: 8.3 g/dL — ABNORMAL LOW (ref 12.0–15.0)
MCH: 31.3 pg (ref 26.0–34.0)
MCHC: 32 g/dL (ref 30.0–36.0)
MCV: 97.7 fL (ref 80.0–100.0)
Platelets: 133 10*3/uL — ABNORMAL LOW (ref 150–400)
RBC: 2.65 MIL/uL — ABNORMAL LOW (ref 3.87–5.11)
RDW: 16.3 % — ABNORMAL HIGH (ref 11.5–15.5)
WBC: 10.3 10*3/uL (ref 4.0–10.5)
nRBC: 0.4 % — ABNORMAL HIGH (ref 0.0–0.2)

## 2024-06-18 LAB — GLUCOSE, CAPILLARY
Glucose-Capillary: 125 mg/dL — ABNORMAL HIGH (ref 70–99)
Glucose-Capillary: 77 mg/dL (ref 70–99)
Glucose-Capillary: 69 mg/dL — ABNORMAL LOW (ref 70–99)
Glucose-Capillary: 135 mg/dL — ABNORMAL HIGH (ref 70–99)
Glucose-Capillary: 71 mg/dL (ref 70–99)
Glucose-Capillary: 77 mg/dL (ref 70–99)
Glucose-Capillary: 83 mg/dL (ref 70–99)
Glucose-Capillary: 105 mg/dL — ABNORMAL HIGH (ref 70–99)
Glucose-Capillary: 85 mg/dL (ref 70–99)

## 2024-06-18 MED ORDER — INSULIN ASPART 100 UNIT/ML IJ SOLN: 0.0000 [IU] | INTRAMUSCULAR | Status: DC

## 2024-06-18 MED ORDER — POTASSIUM CHLORIDE CRYS ER 20 MEQ PO TBCR
20.0000 meq | EXTENDED_RELEASE_TABLET | Freq: Once | ORAL | Status: AC
  Administered 2024-06-18: 20 meq via ORAL
  Filled 2024-06-18: qty 1

## 2024-06-18 MED ORDER — SODIUM CHLORIDE 0.9% FLUSH
10.0000 mL | Freq: Two times a day (BID) | INTRAVENOUS | Status: DC
  Administered 2024-06-18 – 2024-06-23 (×10): 10 mL
  Administered 2024-06-23: 20 mL
  Administered 2024-06-24 (×2): 10 mL
  Administered 2024-06-25: 20 mL
  Administered 2024-06-25: 10 mL
  Administered 2024-06-26: 20 mL
  Administered 2024-06-26 – 2024-06-27 (×2): 10 mL
  Administered 2024-06-27: 20 mL
  Administered 2024-06-28 – 2024-07-05 (×16): 10 mL
  Administered 2024-07-06: 20 mL
  Administered 2024-07-06 – 2024-07-08 (×5): 10 mL
  Administered 2024-07-09: 20 mL
  Administered 2024-07-09 – 2024-07-11 (×4): 10 mL

## 2024-06-18 MED ORDER — TRAVASOL 10 % IV SOLN
INTRAVENOUS | Status: AC
  Filled 2024-06-18: qty 528

## 2024-06-18 MED ORDER — BOOST / RESOURCE BREEZE PO LIQD CUSTOM
1.0000 | Freq: Three times a day (TID) | ORAL | Status: DC
  Administered 2024-06-18 – 2024-06-19 (×3): 1 via ORAL

## 2024-06-18 MED ORDER — SODIUM CHLORIDE 0.9% FLUSH: 10.0000 mL | INTRAVENOUS | Status: DC | PRN

## 2024-06-18 MED ORDER — IOHEXOL 300 MG/ML  SOLN
100.0000 mL | Freq: Once | INTRAMUSCULAR | Status: DC | PRN
  Administered 2024-06-18: 100 mL

## 2024-06-18 MED ORDER — DEXTROSE 50 % IV SOLN
50.0000 mL | INTRAVENOUS | Status: DC | PRN
  Administered 2024-06-18: 50 mL via INTRAVENOUS
  Filled 2024-06-18: qty 50

## 2024-06-18 MED ORDER — TRAVASOL 10 % IV SOLN: INTRAVENOUS | Status: DC

## 2024-06-18 NOTE — Progress Notes (Signed)
 0230 Pt received from 57M, placed on monitor, VS taken, stable, afebrile, pt irritable but follows commands, abd colostomy with bag in place, dark drainage in bag, JP drain LUQ to abdomen to bulb suction, NG tube placed to LIS, pt given Yankauer suction, Rt upper arm PIV with D5LR infusing at 166ml/hr, L LE staples in place s/p femur repair CDI wit ACE and immobilizer in place, pt obese, has stage 3 sacrum pressure ulcer not visualized.   Floey catheter Clear yellow urine noted, will monitor.

## 2024-06-18 NOTE — Progress Notes (Signed)
 PROGRESS NOTE    Christina Castaneda  FMW:[id number] DOB: 05-05-1956 DOA: 06/07/2024 PCP: [name], DO   Chief Complaint  Patient presents with   Fall    Brief Narrative:    68 yo F PMH perf sigmoid colon s/p hartmann, colostomy revision, HTN who was admitted to [facility]H at [facility] 6/15 after presenting for a fall. Found to have a L distal femur fx related to the fall. On admission,  Labs significant for AKI w hyperK. She was medically optimized and then underwent retrograde L femoral nail.  On 6/23 overnight she had a drop in hgb to 6.2 -- associated dark ostomy output and increasing abdominal pain. Transfused. Subsequent hypotension requiring pressor initiation. Went for CTA which was concerning for perforated bleeding ulcer with air bubbles adjacent to duodenal bulb and layering of high attenuation material c/f active extrav.  CCS was consulted and pt went to OR for ex lap, on NE and vaso, having received 3 PRBC 1 plt   PCCM is consulted pre-op for transfer to [place] post operatively    Significant Hospital Events: Including procedures, antibiotic start and stop dates in addition to other pertinent events   6/15 admit to TRH after fall days earlier, L femur fx and AKI 6/20 OR w ortho 6/23 hgb drop, shock -- found to have perforated bleeding duodenal ulcer  6/24 extubated 6/25 off pressors, txf out of ICU  6/25 1 unit PRBC  Assessment & Plan:   Principal Problem:   Peptic ulcer perforation and hemorrhage (HCC) Active Problems:   AKI (acute kidney injury) (HCC)   Leukocytosis   Hypothyroid   COPD  GOLD 2/AB   OSA (obstructive sleep apnea)   Obesity   Tobacco abuse   Essential hypertension   Acute blood loss anemia   Generalized weakness   Chronic pain syndrome   Morbid obesity due to excess calories (HCC)   S/P colostomy (HCC)   Chronic venous insufficiency   Cigarette smoker   Sepsis (HCC)   Femur fracture, left (HCC)   Hemorrhagic shock (HCC)   Thrombocytopenia  (HCC)   Duodenal ulcer   Intra-abdominal adhesions   Severe sepsis (improved shock) 2/2 intraabdominal infection r/t below  Pneumoperitoneum Perforated bleeding duodenal ulcer s/p ex lap, graham patch repair, LOA (6/23 at [facility])  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 NGT LIMW.  - Patient with severe malnutrition, albumin<1.5, will start on TPN TPN -h pylori testing ordered by CCS  -PPI   Chronic hypoxic respiratory failure  COPD OSA on CPAP  Tobacco use  -pulm hygiene she was encouraged with incentive spirometer. -O2 for goal > 88  - -at baseline she uses O2 at night    Anemia -- ABLA + critical illness /hemorrhagic shock Thrombocytopenia, mild  - Acute blood loss anemia from perforated ulcer,. - Transfuse 1 unit PRBC yesterday, with good response, hemoglobin is 8.3, continue to monitor closely and transfuse as needed . -if she were to bleed, would need IR    Fall L femur fx s/p retrograde L femoral nail on 6/20 -appreciate ortho recs 6/21 -PT when appropriate   AKI, improving Urinary retention  -follow renal indices, UOP  -optimize lytes  -cont foley for now -- wont be able to give retention meds as she is NPO    Mild hypoglycemia -incr d5LR to 150/hr and  cont CBG monitoring    Hypothyroidism -synthroid  on hold while npo -- will need to convert to IV in the next couple of days    HLD HTN  -statin on hold while npo -holding antihypertensives, resume (IV as she is NPO)    Stage 3 sacral pressure injury POA -woc consult   Obesity class II Body mass index is 38.52 kg/m.  Severe protein calorie malnutrition - With albumin level> 1.5, will start on TPN specially she is n.p.o. currently and I would anticipate it will take few days before her diet is advanced appropriately to meet her calorie intake   DVT prophylaxis:  SCD Code Status: Full code Family Communication: None at bedside  Status is: Inpatient    Consultants:  PCCM Surgery Orthopedic Gastrointestinal  Subjective:  She remains n.p.o., she reports generalized weakness, fatigue and dry mouth, asking if she can have some liquid  Objective: Vitals:   06/18/24 0150 06/18/24 0810 06/18/24 1110 06/18/24 1151  BP: 115/65 124/65  (!) 115/42  Pulse:  61 63 (!) 57  Resp: 19 15 (!) 22 15  Temp: 98.3 F (36.8 C) 98.2 F (36.8 C)  97.6 F (36.4 C)  TempSrc: Oral Axillary  Axillary  SpO2: 97% 98% (!) 81% 100%  Weight:      Height:        Intake/Output Summary (Last 24 hours) at 06/18/2024 1322 Last data filed at 06/18/2024 1050 Gross per 24 hour  Intake 1339.91 ml  Output 2050 ml  Net -710.09 ml   Filed Weights   06/07/24 1700 06/12/24 1119 06/15/24 1209  Weight: 105 kg 105 kg 105 kg    Examination:  Awake Alert, Oriented X 3, extremely frail, deconditioned and ill-appearing Symmetrical Chest wall movement, diminished air entry RRR,No Gallops,Rubs or new Murmurs, No Parasternal Heave +ve B.Sounds, ostomy present, surgical wound bandaged No Cyanosis, anasarca    Data Reviewed: I have personally reviewed following labs and imaging studies  CBC: Recent Labs  Lab 06/15/24 1142 06/15/24 2127 06/15/24 2142 06/16/24 0415 06/17/24 0425 06/17/24 1330 06/18/24 0031  WBC 23.2*  --  27.6* 25.1* 13.5*  --  10.3  HGB 9.9*   < > 8.7* 8.2* 6.2* 6.7* 8.3*  HCT 28.9*   < > 25.9* 25.2* 19.3* 20.4* 25.9*  MCV 96.3  --  95.2 95.8 101.0*  --  97.7  PLT 149*  --  166 152 125*  --  133*   < > = values in this interval not displayed.    Basic Metabolic Panel: Recent Labs  Lab 06/14/24 0309 06/15/24 0154 06/15/24 2127 06/15/24 2142 06/16/24 0415 06/17/24 0425 06/18/24 0031  NA 134* 136 136 135 134* 136 142  K 4.4 4.7 4.6 4.6 4.6 3.7 3.7  CL 104 102  --  106 107 107 110  CO2 25 21*  --  22 22 25 25   GLUCOSE 82 112*  --  129*  118* 88 81  BUN 45* 72*  --  84* 84* 68* 44*  CREATININE 1.18* 1.29*  --  1.54* 1.56* 1.44* 1.30*  CALCIUM  8.5* 8.9  --  7.9* 7.5* 7.5* 7.7*  MG 1.5* 2.2  --  1.8 1.9  --   --     GFR: Estimated Creatinine Clearance: 48.4 mL/min (A) (by C-G formula based on SCr of 1.3 mg/dL (H)).  Liver Function Tests: Recent Labs  Lab 06/15/24 2142 06/16/24 0415 06/17/24 0425 06/18/24 0031  AST 93* 70* 39 52*  ALT 41 36 23  26  ALKPHOS 39 41 33* 38  BILITOT 0.9 0.9 0.8 0.7  PROT 3.8* 3.9* 3.7* 4.2*  ALBUMIN <1.5* <1.5* <1.5* <1.5*    CBG: Recent Labs  Lab 06/17/24 2356 06/18/24 0407 06/18/24 0517 06/18/24 0809 06/18/24 1151  GLUCAP 87 69* 135* 71 77     Recent Results (from the past 240 hours)  Surgical pcr screen     Status: None   Collection Time: 06/10/24  4:00 PM   Specimen: Nasal Mucosa; Nasal Swab  Result Value Ref Range Status   MRSA, PCR NEGATIVE NEGATIVE Final   Staphylococcus aureus NEGATIVE NEGATIVE Final    Comment: (NOTE) The Xpert SA Assay (FDA approved for NASAL specimens in patients 39 years of age and older), is one component of a comprehensive surveillance program. It is not intended to diagnose infection nor to guide or monitor treatment. Performed at [facility], [address] [place], [place] [address]   MRSA Next Gen by PCR, Nasal     Status: None   Collection Time: 06/15/24  8:01 AM   Specimen: Nasal Mucosa; Nasal Swab  Result Value Ref Range Status   MRSA by PCR Next Gen NOT DETECTED NOT DETECTED Final    Comment: (NOTE) The GeneXpert MRSA Assay (FDA approved for NASAL specimens only), is one component of a comprehensive MRSA colonization surveillance program. It is not intended to diagnose MRSA infection nor to guide or monitor treatment for MRSA infections. Test performance is not FDA approved in patients less than 70 years old. Performed at [facility] [place] [facility], [address], [place], [place] [address]   Aerobic/Anaerobic Culture w Gram Stain  (surgical/deep wound)     Status: None (Preliminary result)   Collection Time: 06/15/24  3:24 PM   Specimen: Path fluid; Body Fluid  Result Value Ref Range Status   Specimen Description   Final    PERITONEAL Performed at [facility], [address] [place], [place] [address]    Special Requests   Final    NONE Performed at [facility], [address] [place], [place] [address]    Gram Stain   Final    RARE WBC PRESENT, PREDOMINANTLY PMN NO ORGANISMS SEEN Performed at [facility], [address], [place], [place] [address]    Culture   Final    ABUNDANT CANDIDA ALBICANS MODERATE CANDIDA GLABRATA    Report Status PENDING  Incomplete  Culture, blood (Routine X 2) w Reflex to ID Panel     Status: None (Preliminary result)   Collection Time: 06/15/24  5:13 PM   Specimen: BLOOD  Result Value Ref Range Status   Specimen Description BLOOD LEFT ANTECUBITAL  Final   Special Requests   Final    BOTTLES DRAWN AEROBIC AND ANAEROBIC Blood Culture adequate volume   Culture   Final    NO GROWTH 2 DAYS Performed at [facility], [address] [place], [place] [address]    Report Status PENDING  Incomplete  Culture, blood (Routine X 2) w Reflex to ID Panel     Status: None (Preliminary result)   Collection Time: 06/15/24  5:24 PM   Specimen: BLOOD  Result Value Ref Range Status   Specimen Description BLOOD LEFT ANTECUBITAL  Final   Special Requests   Final    BOTTLES DRAWN AEROBIC AND ANAEROBIC Blood Culture results may not be optimal due to an inadequate volume of blood received in culture bottles   Culture   Final    NO GROWTH  2 DAYS Performed at [facility], [address], [place], [place] [address]    Report Status PENDING  Incomplete         Radiology Studies: DG UGI W SINGLE CM (SOL OR THIN BA) Result Date: 06/18/2024 CLINICAL DATA:  Provided history: Perforated ulcer. Additional history obtained from electronic MEDICAL RECORD NUMBERStatus post Arlyss patch repair of  perforated proximal duodenal ulcer. EXAM: UPPER GI SERIES WITH KUB TECHNIQUE: A scout radiograph was acquired. Subsequently, a problem-oriented water -soluble contrast upper GI series was performed to assess for contrast leak status post Arlyss patch repair of a perforated proximal duodenal ulcer. The exam was performed by [place], NP, and was supervised and interpreted by Dr. Rockey Childs. FLUOROSCOPY: FLUOROSCOPY Radiation Exposure Index (as provided by the fluoroscopic device): 27.60 mGy Kerma COMPARISON:  CT angiogram of the abdomen/pelvis with bilateral runoff 06/15/2024. FINDINGS: A scout radiograph of the abdomen was acquired. Nasogastric tube terminating in the stomach. Abdominal surgical drain. Overlying skin staples. Nonobstructive bowel gas pattern within the visible abdomen. Subsequently under intermittent fluoroscopy, contrast was hand injected via the patient's nasogastric tube. Contrast opacified the stomach and promptly passed into the proximal small bowel. Irregular contrast collection at the level the gastric antrum/proximal duodenum, likely reflecting the ulcer/surgical site. No evidence of contrast leak. IMPRESSION: 1. Problem-oriented water -soluble contrast upper GI series performed to assess for contrast leak status post Arlyss patch repair of a perforated proximal duodenal ulcer. 2. Irregular contrast collection at the level of the gastric antrum/proximal duodenum likely reflecting the ulcer/surgical site. No evidence of contrast leak. Electronically Signed   By: Rockey Childs D.O.   On: 06/18/2024 11:20   US  EKG SITE RITE Result Date: 06/18/2024 If Site Rite image not attached, placement could not be confirmed due to current cardiac rhythm.       Scheduled Meds:  sodium chloride    Intravenous Once   sodium chloride    Intravenous Once   sodium chloride    Intravenous Once   arformoterol   15 mcg Nebulization Q12H   budesonide  (PULMICORT ) nebulizer solution  0.5 mg  Nebulization BID   Chlorhexidine  Gluconate Cloth  6 each Topical Daily   insulin  aspart  0-9 Units Subcutaneous Q4H   leptospermum manuka honey  1 Application Topical Daily   nystatin    Topical BID   pantoprazole  (PROTONIX ) IV  40 mg Intravenous Q12H   sodium chloride  flush  3 mL Intravenous Q12H   Continuous Infusions:  dextrose  5% lactated ringers  150 mL/hr at 06/18/24 0523   piperacillin-tazobactam (ZOSYN)  IV 12.5 mL/hr at 06/18/24 0525   TPN ADULT (ION)       LOS: 11 days      Brayton Lye, MD [facility]   To contact the attending provider between 7A-7P or the covering provider during after hours 7P-7A, please log into the web site [email or web address] and access using universal [place] password for that web site. If you do not have the password, please call the hospital operator.  06/18/2024, 1:22 PM

## 2024-06-18 NOTE — Progress Notes (Addendum)
 PHARMACY - TOTAL PARENTERAL NUTRITION CONSULT NOTE   Indication: perforated ulcer unable to tolerate oral intake  Patient Measurements: Height: 5' 5 (165.1 cm) Weight: 105 kg (231 lb 7.7 oz) IBW/kg (Calculated) : 57 TPN AdjBW (KG): 69 Body mass index is 38.52 kg/m. Usual Weight: unchanged from current measured  Assessment: 68 YOF s/p EXLAP, omental Graham patch repair of the duodenal ulcer, lysis of adhesions. She has a perforated ulcer unable to tolerate oral intake.   Glucose / Insulin : CBG's <140 not currently in insulin  Electrolytes: Na 142, K 3.7, Ch 110, CO2 25, Ca 7.7 [CoCa 9.78] Renal: BUN 44, Scr 1.30 Hepatic: Alk Phos wnl, AST 52, ALT wnl, Tbili wnl, Albumin <1.5 Intake / Output; MIVF: UOP 0.9 ml/kg/hr, drains 100 ml, NG 175 ml, LBM 6/23 GI Imaging:  GI Surgeries / Procedures: 6/23 EXLAP  Central access: 6/26 TPN start date: 6/26  Nutritional Goals: Goal TPN rate is 70 mL/hr (provides 99 g of protein and 1750 kcals per day)  RD Assessment: Estimated Needs Total Energy Estimated Needs: 1600-1800 Total Protein Estimated Needs: 85-100g Total Fluid Estimated Needs: >/=1.6L  Current Nutrition:  NPO and TPN  Plan:  Start TPN at 40mL/hr at 1800 Electrolytes in TPN: Na 94 mEq/L, K 80 mEq/L, Ca 8 mEq/L, Mg 9 mEq/L, and Phos 20 mmol/L. Cl:Ac 1:1 Add standard MVI and trace elements to TPN Initiate Sensitive q4h SSI and adjust as needed  Monitor TPN labs on Mon/Thurs, daily x3 and prn  Gladine Plude BS, PharmD, BCPS Clinical Pharmacist 06/18/2024 11:18 AM  Contact: [phone] after 3 PM  Be curious, not judgmental... -Davina Sprinkles

## 2024-06-18 NOTE — Progress Notes (Addendum)
 Patient ID: Christina Castaneda, female   DOB: 01/29/1956, 68 y.o.   MRN: [id number]   Acute Care Surgery Service Progress Note:    Chief Complaint/Subjective: No complaints   Objective: Vital signs in last 24 hours: Temp:  [97.6 F (36.4 C)-98.6 F (37 C)] 97.6 F (36.4 C) (06/26 1151) Pulse Rate:  [53-68] 57 (06/26 1151) Resp:  [13-22] 15 (06/26 1151) BP: (102-143)/(42-74) 115/42 (06/26 1151) SpO2:  [81 %-100 %] 100 % (06/26 1151) Last BM Date : 06/15/24  Intake/Output from previous day: 06/25 0701 - 06/26 0700 In: 2715.7 [I.V.:1703.5; Blood:683.3; IV Piggyback:328.8] Out: 2550 [Urine:2275; Emesis/NG output:175; Drains:100] Intake/Output this shift: No intake/output data recorded.  Lungs:nonlabored  Cardiovascular: reg  Abd: soft obese, JP - serous; ostomy- liquid stool  Extremities:anasarca, +SCDs  Neuro: alert, nonfocal  Lab Results: CBC  Recent Labs    06/17/24 0425 06/17/24 1330 06/18/24 0031  WBC 13.5*  --  10.3  HGB 6.2* 6.7* 8.3*  HCT 19.3* 20.4* 25.9*  PLT 125*  --  133*   BMET Recent Labs    06/17/24 0425 06/18/24 0031  NA 136 142  K 3.7 3.7  CL 107 110  CO2 25 25  GLUCOSE 88 81  BUN 68* 44*  CREATININE 1.44* 1.30*  CALCIUM  7.5* 7.7*   LFT    Latest Ref Rng & Units 06/18/2024   12:31 AM 06/17/2024    4:25 AM 06/16/2024    4:15 AM  Hepatic Function  Total Protein 6.5 - 8.1 g/dL 4.2  3.7  3.9   Albumin 3.5 - 5.0 g/dL <8.4  <8.4  <8.4   AST 15 - 41 U/L 52  39  70   ALT 0 - 44 U/L 26  23  36   Alk Phosphatase 38 - 126 U/L 38  33  41   Total Bilirubin 0.0 - 1.2 mg/dL 0.7  0.8  0.9    PT/INR Recent Labs    06/15/24 2142  LABPROT 19.9*  INR 1.7*   ABG Recent Labs    06/15/24 1725 06/15/24 2127  PHART 7.33* 7.365  HCO3 23.2 22.0    Studies/Results:  Anti-infectives: Anti-infectives (From admission, onward)    Start     Dose/Rate Route Frequency Ordered Stop   06/16/24 1800  vancomycin  (VANCOCIN ) IVPB 1000 mg/200 mL premix   Status:  Discontinued        1,000 mg 200 mL/hr over 60 Minutes Intravenous Every 24 hours 06/15/24 1317 06/15/24 1328   06/15/24 1700  vancomycin  (VANCOREADY) IVPB 2000 mg/400 mL  Status:  Discontinued        2,000 mg 200 mL/hr over 120 Minutes Intravenous  Once 06/15/24 1317 06/15/24 1328   06/15/24 1600  fluconazole (DIFLUCAN) IVPB 400 mg  Status:  Discontinued        400 mg 100 mL/hr over 120 Minutes Intravenous Every 24 hours 06/15/24 1317 06/16/24 0914   06/15/24 1500  metroNIDAZOLE  (FLAGYL ) IVPB 500 mg  Status:  Discontinued        500 mg 100 mL/hr over 60 Minutes Intravenous Every 12 hours 06/15/24 1216 06/15/24 1255   06/15/24 1400  ciprofloxacin  (CIPRO ) IVPB 400 mg  Status:  Discontinued        400 mg 200 mL/hr over 60 Minutes Intravenous Every 12 hours 06/15/24 1216 06/15/24 1255   06/15/24 1400  piperacillin-tazobactam (ZOSYN) IVPB 3.375 g        3.375 g 12.5 mL/hr over 240 Minutes Intravenous Every 8 hours 06/15/24 1317  06/15/24 1400  vancomycin  (VANCOCIN ) IVPB 1000 mg/200 mL premix  Status:  Discontinued        1,000 mg 200 mL/hr over 60 Minutes Intravenous Every 24 hours 06/15/24 1328 06/16/24 0914   06/15/24 1333  vancomycin  (VANCOCIN ) 1-5 GM/200ML-% IVPB       Note to Pharmacy: Romona Shaver E: cabinet override      06/15/24 1333 06/15/24 1410   06/15/24 1221  ciprofloxacin  (CIPRO ) 400 MG/200ML IVPB  Status:  Discontinued       Note to Pharmacy: Dannielle Railing S: cabinet override      06/15/24 1221 06/15/24 1343   06/15/24 1221  metroNIDAZOLE  (FLAGYL ) 500 MG/100ML IVPB  Status:  Discontinued       Note to Pharmacy: Dannielle Railing S: cabinet override      06/15/24 1221 06/15/24 1343   06/12/24 1223  clindamycin  (CLEOCIN ) 900 MG/50ML IVPB       Note to Pharmacy: Elaine Nest L: cabinet override      06/12/24 1223 06/12/24 1332   06/12/24 1131  ceFAZolin  (ANCEF ) 2-4 GM/100ML-% IVPB  Status:  Discontinued       Note to Pharmacy: Billy Sluder H: cabinet  override      06/12/24 1131 06/12/24 1434   06/11/24 1300  clindamycin  (CLEOCIN ) IVPB 900 mg  Status:  Discontinued        900 mg 100 mL/hr over 30 Minutes Intravenous On call to O.R. 06/10/24 1041 06/10/24 1447   06/11/24 1300  ceFAZolin  (ANCEF ) IVPB 2g/100 mL premix        2 g 200 mL/hr over 30 Minutes Intravenous On call to O.R. 06/10/24 1447 06/12/24 1908       Medications: Scheduled Meds:  sodium chloride    Intravenous Once   sodium chloride    Intravenous Once   sodium chloride    Intravenous Once   arformoterol   15 mcg Nebulization Q12H   budesonide  (PULMICORT ) nebulizer solution  0.5 mg Nebulization BID   Chlorhexidine  Gluconate Cloth  6 each Topical Daily   insulin  aspart  0-9 Units Subcutaneous Q4H   leptospermum manuka honey  1 Application Topical Daily   nystatin    Topical BID   pantoprazole  (PROTONIX ) IV  40 mg Intravenous Q12H   sodium chloride  flush  10-40 mL Intracatheter Q12H   sodium chloride  flush  3 mL Intravenous Q12H   Continuous Infusions:  piperacillin-tazobactam (ZOSYN)  IV 12.5 mL/hr at 06/18/24 0525   TPN ADULT (ION)     PRN Meds:.dextrose , fentaNYL  (SUBLIMAZE ) injection, iohexol , ipratropium-albuterol , magic mouthwash w/lidocaine , [DISCONTINUED] ondansetron  **OR** ondansetron  (ZOFRAN ) IV, mouth rinse, prochlorperazine, sodium chloride  flush, sodium chloride  flush  Assessment/Plan: Patient Active Problem List   Diagnosis Date Noted   Intra-abdominal adhesions 06/16/2024   Hemorrhagic shock (HCC) 06/15/2024   Peptic ulcer perforation and hemorrhage (HCC) 06/15/2024   Leukocytosis 06/15/2024   Thrombocytopenia (HCC) 06/15/2024   Duodenal ulcer 06/15/2024   Femur fracture, left (HCC) 06/07/2024   AKI (acute kidney injury) (HCC) 05/27/2024    Class: Acute   Hyperlipidemia 05/27/2024   Sepsis (HCC) 05/27/2024   Cigarette smoker 01/04/2023   Chronic venous insufficiency 02/21/2017   Lymphedema 02/21/2017   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    Adjustment disorder with mixed anxiety and depressed mood    Debility 05/02/2016   Edema    Generalized weakness    Chronic pain syndrome    Hypoalbuminemia due to protein-calorie malnutrition (HCC)    OSA (obstructive sleep apnea)    Morbid obesity due to excess calories (HCC)  S/P colostomy (HCC)    Tracheostomy status (HCC)    Tracheostomy care (HCC)    COPD  GOLD 2/AB    Tobacco abuse    Essential hypertension    Bilateral knee pain    Dysphagia    Acute blood loss anemia    Hyponatremia    Thyroid  activity decreased    Acute respiratory failure (HCC)    Pressure ulcer 04/01/2016   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 Johanna at [facility] 6/23  -looks well today -s/p 2u prbc since tx to [facility]. JP serosang - no active bleeding.  If she were to start actively bleeding would likely need IR eval for angioembo -cont abx therapy, WBC much improved to 10K -cont bid PPI -h. Pylori test pending -multi-modal pain control -PT, OOB as able pending femur fx recs -upper GI - today - no leak, some outpouching - likely area of cratered ulcer -surgically stable to tx out of unit -d/w primary service on ward   FEN - dc ngt (I removed), clears, TRH starting TPN, don't anti need for prolonged TPN unless develops leak VTE - on hold due to anemia, consider chemical VTE prophylaxis in a day or so ID - zosyn - would stop on POD 5;    I reviewed Consultant CCM notes, last 24 h vitals and pain scores, last 48 h intake and output, last 24 h labs and trends, and last 24 h imaging results. D/w TRH    LOS: 10 days  Disposition:  LOS: 11 days    Camellia HERO. Tanda, MD, FACS General, Bariatric, &  Minimally Invasive Surgery [phone] [facility], [facility]

## 2024-06-18 NOTE — Progress Notes (Signed)
Pt transported to 5W.

## 2024-06-18 NOTE — Progress Notes (Signed)
 PT Cancellation Note  Patient Details Name: Christina Castaneda MRN: [id number] DOB: 1956-05-05   Cancelled Treatment:    Reason Eval/Treat Not Completed: Patient at procedure or test/unavailable (Pt now having PICC line placed. Will follow up tomorrow.)   Janice Bodine 06/18/2024, 1:39 PM

## 2024-06-18 NOTE — Progress Notes (Signed)
 Peripherally Inserted Central Catheter Placement  The IV Nurse has discussed with the patient and/or persons authorized to consent for the patient, the purpose of this procedure and the potential benefits and risks involved with this procedure.  The benefits include less needle sticks, lab draws from the catheter, and the patient may be discharged home with the catheter. Risks include, but not limited to, infection, bleeding, blood clot (thrombus formation), and puncture of an artery; nerve damage and irregular heartbeat and possibility to perform a PICC exchange if needed/ordered by physician.  Alternatives to this procedure were also discussed.  Bard Power PICC patient education guide, fact sheet on infection prevention and patient information card has been provided to patient /or left at bedside.    PICC Placement Documentation  PICC Double Lumen 06/18/24 Right Cephalic 38 cm 0 cm (Active)  Indication for Insertion or Continuance of Line Administration of hyperosmolar/irritating solutions (i.e. TPN, Vancomycin , etc.) 06/18/24 1335  Exposed Catheter (cm) 0 cm 06/18/24 1335  Site Assessment Clean, Dry, Intact 06/18/24 1335  Lumen #1 Status Flushed;Saline locked;Blood return noted 06/18/24 1335  Lumen #2 Status Flushed;Blood return noted;Saline locked 06/18/24 1335  Dressing Type Securing device;Transparent 06/18/24 1335  Dressing Status Antimicrobial disc/dressing in place;Clean, Dry, Intact 06/18/24 1335  Line Adjustment (NICU/IV Team Only) No 06/18/24 1335  Dressing Intervention Adhesive placed at insertion site (IV team only) 06/18/24 1335  Dressing Change Due 06/25/24 06/18/24 1335       Bonni Rock Larve 06/18/2024, 1:37 PM

## 2024-06-18 NOTE — Progress Notes (Signed)
 PT Cancellation Note  Patient Details Name: Christina Castaneda MRN: [id number] DOB: September 12, 1956   Cancelled Treatment:    Reason Eval/Treat Not Completed: Patient at procedure or test/unavailable (Pt off the floor at radiology. Will follow up if time allows.)   Hannan Hutmacher 06/18/2024, 9:20 AM

## 2024-06-18 NOTE — TOC Progression Note (Signed)
 Transition of Care [facility]) - Progression Note    Patient Details  Name: Christina Castaneda MRN: [id number] Date of Birth: May 03, 1956  Transition of Care [facility]) CM/SW Contact  Inocente GORMAN Kindle, LCSW Phone Number: 06/18/2024, 9:45 AM  Clinical Narrative:    9am-Patient transferred to 5W. Per [facility] handoff from [facility], patient had been accepted at [facility] and received insurance approval. CSW will clarify authorization dates as it will likely need to be re-submitted.   9:48 AM-CSW confirmed that patient was approved for [facility], Ref# [id number], Auth ID# [id number], effective until 06/18/24 at midnight. CSW will continue to follow for medical stability.    Expected Discharge Plan: Skilled Nursing Facility Barriers to Discharge: Continued Medical Work up, Other (must enter comment) (SNF auth pending)  Expected Discharge Plan and Services In-house Referral: Clinical Social Work Discharge Planning Services: CM Consult Post Acute Care Choice: Skilled Nursing Facility Living arrangements for the past 2 months: Single Family Home                                       Social Determinants of Health (SDOH) Interventions SDOH Screenings   Food Insecurity: No Food Insecurity (06/08/2024)  Housing: Low Risk  (06/08/2024)  Transportation Needs: No Transportation Needs (06/08/2024)  Utilities: Not At Risk (06/08/2024)  Social Connections: Unknown (06/08/2024)  Recent Concern: Social Connections - Moderately Isolated (05/27/2024)  Tobacco Use: High Risk (06/16/2024)    Readmission Risk Interventions    05/27/2024   10:01 PM  Readmission Risk Prevention Plan  Post Dischage Appt Complete  Medication Screening Complete  Transportation Screening Complete

## 2024-06-19 ENCOUNTER — Encounter (HOSPITAL_COMMUNITY): Payer: Self-pay | Admitting: Orthopedic Surgery

## 2024-06-19 DIAGNOSIS — K276 Chronic or unspecified peptic ulcer, site unspecified, with both hemorrhage and perforation: Secondary | ICD-10-CM | POA: Diagnosis not present

## 2024-06-19 LAB — TYPE AND SCREEN
ABO/RH(D): O POS
Unit division: 0
Unit division: 0
Unit division: 0
Unit division: 0
Unit division: 0

## 2024-06-19 LAB — BPAM RBC
Blood Product Expiration Date: 202507102359
Blood Product Expiration Date: 202507152359
Blood Product Expiration Date: 202507152359
Blood Product Expiration Date: 202507152359
Blood Product Expiration Date: 202507152359
ISSUE DATE / TIME: 202506230445
ISSUE DATE / TIME: 202506230735
ISSUE DATE / TIME: 202506230928
ISSUE DATE / TIME: 202506231518
Unit Type and Rh: 5100
Unit Type and Rh: 5100
Unit Type and Rh: 5100
Unit Type and Rh: 5100
Unit Type and Rh: 5100

## 2024-06-19 LAB — GLUCOSE, CAPILLARY
Glucose-Capillary: 88 mg/dL (ref 70–99)
Glucose-Capillary: 87 mg/dL (ref 70–99)
Glucose-Capillary: 104 mg/dL — ABNORMAL HIGH (ref 70–99)
Glucose-Capillary: 94 mg/dL (ref 70–99)

## 2024-06-19 LAB — CBC
HCT: 25.3 % — ABNORMAL LOW (ref 36.0–46.0)
Hemoglobin: 8 g/dL — ABNORMAL LOW (ref 12.0–15.0)
MCH: 31.9 pg (ref 26.0–34.0)
MCHC: 31.6 g/dL (ref 30.0–36.0)
MCV: 100.8 fL — ABNORMAL HIGH (ref 80.0–100.0)
Platelets: 103 10*3/uL — ABNORMAL LOW (ref 150–400)
RBC: 2.51 MIL/uL — ABNORMAL LOW (ref 3.87–5.11)
RDW: 16.9 % — ABNORMAL HIGH (ref 11.5–15.5)
WBC: 7.8 10*3/uL (ref 4.0–10.5)
nRBC: 0.3 % — ABNORMAL HIGH (ref 0.0–0.2)

## 2024-06-19 LAB — COMPREHENSIVE METABOLIC PANEL WITH GFR
ALT: 29 U/L (ref 0–44)
AST: 64 U/L — ABNORMAL HIGH (ref 15–41)
Albumin: <1.5 g/dL — ABNORMAL LOW (ref 3.5–5.0)
Alkaline Phosphatase: 40 U/L (ref 38–126)
Anion gap: 6 (ref 5–15)
BUN: 26 mg/dL — ABNORMAL HIGH (ref 8–23)
CO2: 28 mmol/L (ref 22–32)
Calcium: 7.9 mg/dL — ABNORMAL LOW (ref 8.9–10.3)
Chloride: 110 mmol/L (ref 98–111)
Creatinine, Ser: 1.14 mg/dL — ABNORMAL HIGH (ref 0.44–1.00)
GFR, Estimated: 52 mL/min — ABNORMAL LOW (ref >60)
Glucose, Bld: 85 mg/dL (ref 70–99)
Potassium: 4.1 mmol/L (ref 3.5–5.1)
Sodium: 144 mmol/L (ref 135–145)
Total Bilirubin: 0.6 mg/dL (ref 0.0–1.2)
Total Protein: 4 g/dL — ABNORMAL LOW (ref 6.5–8.1)

## 2024-06-19 LAB — PHOSPHORUS: Phosphorus: 2.4 mg/dL — ABNORMAL LOW (ref 2.5–4.6)

## 2024-06-19 LAB — MAGNESIUM: Magnesium: 1.7 mg/dL (ref 1.7–2.4)

## 2024-06-19 LAB — TRIGLYCERIDES: Triglycerides: 60 mg/dL (ref <150)

## 2024-06-19 MED ORDER — OXYCODONE HCL 5 MG PO TABS
5.0000 mg | ORAL_TABLET | Freq: Four times a day (QID) | ORAL | Status: AC | PRN
  Administered 2024-06-19 (×2): 5 mg via ORAL
  Filled 2024-06-19 (×2): qty 1

## 2024-06-19 MED ORDER — LEVOTHYROXINE SODIUM 75 MCG PO TABS
150.0000 ug | ORAL_TABLET | Freq: Every day | ORAL | Status: DC
  Administered 2024-06-20 – 2024-07-02 (×12): 150 ug via ORAL
  Filled 2024-06-19 (×12): qty 2

## 2024-06-19 MED ORDER — TRAVASOL 10 % IV SOLN: INTRAVENOUS | Status: DC

## 2024-06-19 MED ORDER — JUVEN PO PACK
1.0000 | PACK | Freq: Two times a day (BID) | ORAL | Status: DC
  Administered 2024-06-19 – 2024-06-29 (×15): 1 via ORAL
  Filled 2024-06-19 (×20): qty 1

## 2024-06-19 MED ORDER — POLYETHYLENE GLYCOL 3350 17 G PO PACK: 17.0000 g | PACK | Freq: Every day | ORAL | Status: DC | PRN

## 2024-06-19 MED ORDER — HEPARIN SODIUM (PORCINE) 5000 UNIT/ML IJ SOLN
5000.0000 [IU] | Freq: Three times a day (TID) | INTRAMUSCULAR | Status: DC
  Administered 2024-06-19 – 2024-06-23 (×13): 5000 [IU] via SUBCUTANEOUS
  Filled 2024-06-19 (×14): qty 1

## 2024-06-19 MED ORDER — ENSURE PLUS HIGH PROTEIN PO LIQD
237.0000 mL | Freq: Two times a day (BID) | ORAL | Status: DC
  Administered 2024-06-19 – 2024-07-06 (×16): 237 mL via ORAL

## 2024-06-19 MED ORDER — MAGNESIUM SULFATE 2 GM/50ML IV SOLN
2.0000 g | Freq: Once | INTRAVENOUS | Status: AC
  Administered 2024-06-19: 2 g via INTRAVENOUS
  Filled 2024-06-19: qty 50

## 2024-06-19 MED ORDER — POTASSIUM PHOSPHATES 15 MMOLE/5ML IV SOLN
20.0000 mmol | Freq: Once | INTRAVENOUS | Status: AC
  Administered 2024-06-19: 20 mmol via INTRAVENOUS
  Filled 2024-06-19: qty 6.67

## 2024-06-19 MED ORDER — TRAVASOL 10 % IV SOLN
INTRAVENOUS | Status: AC
  Filled 2024-06-19: qty 528

## 2024-06-19 NOTE — TOC Progression Note (Signed)
 Transition of Care [facility]) - Progression Note    Patient Details  Name: Christina Castaneda MRN: [id number] Date of Birth: [birth year]  [facility]) CM/SW Contact  Inocente GORMAN Kindle, LCSW Phone Number: 06/19/2024, 11:00 AM  Clinical Narrative:    CSW provided update to [facility].    Expected Discharge Plan: Skilled Nursing Facility Barriers to Discharge: Continued Medical Work up, Other (must enter comment) (SNF auth pending)  Expected Discharge Plan and Services In-house Referral: Clinical Social Work Discharge Planning Services: CM Consult Post Acute Care Choice: Skilled Nursing Facility Living arrangements for the past 2 months: Single Family Home                                       Social Determinants of Health (SDOH) Interventions SDOH Screenings   Food Insecurity: No Food Insecurity (06/08/2024)  Housing: Low Risk  (06/08/2024)  Transportation Needs: No Transportation Needs (06/08/2024)  Utilities: Not At Risk (06/08/2024)  Social Connections: Unknown (06/08/2024)  Recent Concern: Social Connections - Moderately Isolated (05/27/2024)  Tobacco Use: High Risk (06/16/2024)    Readmission Risk Interventions    05/27/2024   10:01 PM  Readmission Risk Prevention Plan  Post Dischage Appt Complete  Medication Screening Complete  Transportation Screening Complete

## 2024-06-19 NOTE — Progress Notes (Signed)
   06/19/24 2249  BiPAP/CPAP/SIPAP  $ Non-Invasive Ventilator  Non-Invasive Vent Subsequent  BiPAP/CPAP/SIPAP Pt Type Adult  BiPAP/CPAP/SIPAP DREAMSTATIOND  Mask Type Prongs-short;Nasal pillows  Mask Size Small  PEEP 8 cmH20  FiO2 (%) 32 %  Flow Rate 3 lpm  Patient Home Machine No  Patient Home Mask Yes  Patient Home Tubing No  Auto Titrate No  CPAP/SIPAP surface wiped down Yes  Device Plugged into RED Power Outlet Yes  BiPAP/CPAP /SiPAP Vitals  Pulse Rate (!) 51  Resp 20  SpO2 100 %  Bilateral Breath Sounds Diminished  MEWS Score/Color  MEWS Score 0  MEWS Score Color Landy

## 2024-06-19 NOTE — Progress Notes (Addendum)
 Nutrition Follow-up  DOCUMENTATION CODES:   Obesity unspecified  INTERVENTION:  Initiate TPN to meet majority of patient's estimated needs Managed per pharmacy MVI and trace elements added to TPN  TPN provides: 926 kcals (58% of minimum estimated needs), 52g protein (61% minimum estimated needs)   Monitor diet advancement and tolerance, recommend DYS3 diet when advanced for ease of consumption Add Ensure Plus High Protein po daily, each supplement provides 350 kcal and 20 grams of protein now that diet advanced to full liquid Juven BID to support wound healing   NUTRITION DIAGNOSIS:  Increased nutrient needs related to acute illness, wound healing as evidenced by estimated needs. - remains applicable  GOAL:  Patient will meet greater than or equal to 90% of their needs - progressing  MONITOR:  Diet advancement, Labs, Weight trends, I & O's, Skin  REASON FOR ASSESSMENT:  Rounds    ASSESSMENT:   Pt admitted after a recent fall leading to L distal femur fracture s/p L femoral nailing. PMH significant for perforated sigmoid colon s/p Hartmann procedure, s/p colostomy revision, HTN.  6/20: s/p L femoral nail 6/23: perforated bleeding duodenal ulcer; s/p exlap with graham patch repair, 1 prbc transfused 6/25: 1 prbc transfused 6/26: NGT removed, UGI: no leak; txr out of ICU, TPN initiated, clear liquid diet initiated 6/27: advanced to full liquid diet   MD starting TPN yesterday d/t low albumin. Long-term TPN use not likely as diet being advanced. She was advanced to clear liquid diet yesterday.   Average Meal Intake No documented intake since diet advancement   She denies much intake of oral diet. Discussed TPN and its indication as well as expected timeframe. She is in visible pain today and unwilling/unable to engage much with this Clinical research associate. Endorses no N/V. Ostomy bag observed w/ brown, bilious output.    Encouraged intake of advanced diet and, at the very least,  supplementation to meet needs in an effort to meet her minimum estimated needs, as TPN only providing approximately 60% calorie and protein needs. Will need DYS3 diet once advanced to aid in consumption given that she has no teeth and lost her dentures.   Admit Weight: 105kg - ?accuracy, appears pulled forward/stated Current Weight: 122.8kg +generalized non-pitting edema  Suspect documented admission weights are likely estimated/stated. Between 09/02/23-05/31/24, pt appears to have had a weight loss of 13.1% which is not clinically significant for time frame though is concerning. Edema has improved and not significant today.    Intake/Output Summary (Last 24 hours) at 06/19/2024 1338 Last data filed at 06/19/2024 1130 Gross per 24 hour  Intake --  Output 1690 ml  Net -1690 ml     WBC have normalized. BUN has decreased to marginal elevation. Crt normalizing. Insulin  has been discontinued.   Drains/lines: UOP: x24 hours RUQ colostomy: no documented output Midline abdominal JP drain: 90ml  so far today   Medications/Drips: nystatin  Pantoprazole  2g IV mg sulfate x1 20mmol K+ phos x1 IV ABX   Labs:  Sodium 144 BUN 26 Cr 1.14 PHOS 2.4 Albumin <1.5 AST 64 CBG's 81-85 x24 hours  Diet Order:   Diet Order             Diet full liquid Room service appropriate? Yes; Fluid consistency: Thin  Diet effective now            EDUCATION NEEDS:  Not appropriate for education at this time  Skin:  Skin Assessment: Skin Integrity Issues: Skin Integrity Issues:: Stage III, Stage II, Other (  Comment) Stage II: sacrum Stage III: mid coccyx Other: IAD L buttock, L groin, abdomen, bilateral sacrum, vagina  Last BM:  6/23 via colostomy prior to Surgery  Height:  Ht Readings from Last 1 Encounters:  06/15/24 5' 5 (1.651 m)   Weight:  Wt Readings from Last 1 Encounters:  06/19/24 122.8 kg   Ideal Body Weight:  56.8 kg  BMI:  Body mass index is 45.05 kg/m.  Estimated  Nutritional Needs:   Kcal:  1600-1800  Protein:  85-100g  Fluid:  >/=1.6L  Blair Deaner MS, RD, LDN Registered Dietitian Clinical Nutrition RD Inpatient Contact Info in Amion

## 2024-06-19 NOTE — Progress Notes (Addendum)
 PHARMACY - TOTAL PARENTERAL NUTRITION CONSULT NOTE  Indication: perforated ulcer unable to tolerate oral intake  Patient Measurements: Height: 5' 5 (165.1 cm) Weight: 122.8 kg (270 lb 11.6 oz) IBW/kg (Calculated) : 57 TPN AdjBW (KG): 69 Body mass index is 45.05 kg/m.  Assessment:  66 YOF s/p ex-lap, omental Graham patch repair of the duodenal ulcer, lysis of adhesions. She has a perforated ulcer unable to tolerate oral intake.  Pharmacy consulted to dose TPN.  Glucose / Insulin : no hx DM - CBGs < 180.  No SSI usage. Electrolytes: Phos 2.4, others WNL (Na high normal and Mag low normal) Renal: SCr down 1.14, BUN 26 Hepatic: LFTs WNL except mildly elevated AST, tbili / TG WNL, albumin < 1.5 Intake / Output; MIVF: UOP 0.8 ml/kg/hr, drains , LBM 6/23 GI Imaging: none since TPN initiation  GI Surgeries / Procedures: none since TPN initiation  Central access: PICC placed 6/26/5 TPN start date: 06/18/24  Nutritional Goals: Goal TPN rate is 70 mL/hr (provides 92g AA and 1619 kCal per day)  RD Estimated Needs Total Energy Estimated Needs: 1600-1800 Total Protein Estimated Needs: 85-100g Total Fluid Estimated Needs: >/=1.6L  Current Nutrition:  TPN Advance to FLD Resource Breeze TID - 1 charted given yesterday  Plan:  Advance diet and keep TPN at current rate per MD Continue TPN at 40 mL/hr, providing 52g AA, 130g CHO, 27g ILE and 926 kCal, meeting ~60% of needs Electrolytes in TPN: reduce Na to 39mEq/L, K 80 mEq/L, Ca 8 mEq/L, increase Mg to 51mEq/L and Phos to 69mmol/L. Cl:Ac 1:1 Add standard MVI and trace elements to TPN D/C SSI and CBG checks KPhos 20mmol IV x 1 Mag sulfate 2gm IV x 1  Monitor TPN labs on Mon/Thurs - labs in AM F/u PO intake/diet advancement to wean TPN  Parv Manthey D. Lendell, PharmD, BCPS, BCCCP 06/19/2024, 10:04 AM

## 2024-06-19 NOTE — Progress Notes (Signed)
 CPAP set up for HS use. Patient has home mask. EPAP set @ 8.0 cmH2O per previous settings.

## 2024-06-19 NOTE — Plan of Care (Signed)
  Problem: Education: Goal: Knowledge of General Education information will improve Description: Including pain rating scale, medication(s)/side effects and non-pharmacologic comfort measures Outcome: Progressing   Problem: Health Behavior/Discharge Planning: Goal: Ability to manage health-related needs will improve Outcome: Progressing   Problem: Clinical Measurements: Goal: Ability to maintain clinical measurements within normal limits will improve Outcome: Progressing Goal: Will remain free from infection Outcome: Progressing Goal: Diagnostic test results will improve Outcome: Progressing Goal: Respiratory complications will improve Outcome: Progressing Goal: Cardiovascular complication will be avoided Outcome: Progressing   Problem: Activity: Goal: Risk for activity intolerance will decrease Outcome: Progressing   Problem: Nutrition: Goal: Adequate nutrition will be maintained Outcome: Progressing   Problem: Coping: Goal: Level of anxiety will decrease Outcome: Progressing   Problem: Elimination: Goal: Will not experience complications related to bowel motility Outcome: Progressing Goal: Will not experience complications related to urinary retention Outcome: Progressing   Problem: Pain Managment: Goal: General experience of comfort will improve and/or be controlled Outcome: Progressing   Problem: Safety: Goal: Ability to remain free from injury will improve Outcome: Progressing   Problem: Skin Integrity: Goal: Risk for impaired skin integrity will decrease Outcome: Progressing   Problem: Safety: Goal: Non-violent Restraint(s) Outcome: Progressing   Problem: Activity: Goal: Ability to tolerate increased activity will improve Outcome: Progressing   Problem: Respiratory: Goal: Ability to maintain a clear airway and adequate ventilation will improve Outcome: Progressing   Problem: Role Relationship: Goal: Method of communication will improve Outcome:  Progressing

## 2024-06-19 NOTE — Progress Notes (Signed)
 Occupational Therapy Treatment Patient Details Name: Christina Castaneda MRN: [id number] DOB: 12-04-1956 Today's Date: 06/19/2024   History of present illness Pt is a 68 y/o F presenting to [facility] ED on 6/15 after fall, found to have L femoral neck fx and AKI. S/p retrograde IMN on 6/20. Hgb drop on 6/23 with dark ostomy output, transfused, subsequent hypotension requiring pressors. Found to have perforated bleeding duodenal ulcer s/p ex lap on 6/23. Transferred to [facility] for PCCM. 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   OT comments  Pt making slower progress towards OT goals primarily due to pain limitations and LB weakness. Pt premedicated with pain meds, able to assist with rolling side to side with Max A x 2 during wound care. Pt able to demo therapeutic exercises bed level but deferring EOB attempts today. Pt would benefit from lift OOB to recliner in next session w/ pt agreeable to this plan.       If plan is discharge home, recommend the following:  A lot of help with bathing/dressing/bathroom;Assistance with cooking/housework;Assist for transportation;Help with stairs or ramp for entrance;Two people to help with walking and/or transfers   Equipment Recommendations  Wheelchair (measurements OT);Wheelchair cushion (measurements OT);Hospital bed;Hoyer lift    Recommendations for Other Services      Precautions / Restrictions Precautions Precautions: Knee;Fall Recall of Precautions/Restrictions: Intact Precaution/Restrictions Comments: JP drain, colostomy, sacral wound Required Braces or Orthoses: Knee Immobilizer - Left Restrictions Weight Bearing Restrictions Per Provider Order: Yes LLE Weight Bearing Per Provider Order: Weight bearing as tolerated       Mobility Bed Mobility Overal bed mobility: Needs Assistance Bed Mobility: Rolling Rolling: Max assist, +2 for physical assistance, +2 for safety/equipment, Used rails          General bed mobility comments: Max A x 2 to roll side to side for wound care and linen change.    Transfers                         Balance                                           ADL either performed or assessed with clinical judgement   ADL Overall ADL's : Needs assistance/impaired     Grooming: Set up;Bed level;Wash/dry face           Upper Body Dressing : Minimal assistance;Bed level           Toileting- Clothing Manipulation and Hygiene: Total assistance;Bed level         General ADL Comments: Rn also assisting with wound care during sesion while rolling. Discussed obtaining air mattress, UE therapeutic exercises to complete bed level to assist with functional abiltiies    Extremity/Trunk Assessment Upper Extremity Assessment Upper Extremity Assessment: Overall WFL for tasks assessed;Right hand dominant   Lower Extremity Assessment Lower Extremity Assessment: Defer to PT evaluation        Vision   Vision Assessment?: No apparent visual deficits   Perception     Praxis     Communication Communication Communication: No apparent difficulties   Cognition Arousal: Alert Behavior During Therapy: WFL for tasks assessed/performed Cognition: No apparent impairments  Following commands: Intact        Cueing   Cueing Techniques: Verbal cues  Exercises Exercises: Other exercises Other Exercises Other Exercises: pulling on B bedrails to long sitting (partially to 25%)    Shoulder Instructions       General Comments      Pertinent Vitals/ Pain       Pain Assessment Pain Assessment: Faces Faces Pain Scale: Hurts even more Pain Location: abdomen, bottom with wound care Pain Descriptors / Indicators: Sore, Sharp Pain Intervention(s): Monitored during session, Premedicated before session, RN gave pain meds during session  Home Living                                           Prior Functioning/Environment              Frequency  Min 2X/week        Progress Toward Goals  OT Goals(current goals can now be found in the care plan section)  Progress towards OT goals: OT to reassess next treatment  Acute Rehab OT Goals Patient Stated Goal: pain control, be able to go home with husband OT Goal Formulation: With patient Time For Goal Achievement: 07/01/24 Potential to Achieve Goals: Good ADL Goals Pt Will Perform Grooming: with modified independence;sitting Pt Will Perform Upper Body Dressing: with supervision;sitting Pt Will Perform Lower Body Dressing: with mod assist;with adaptive equipment;sitting/lateral leans;sit to/from stand Pt Will Transfer to Toilet: regular height toilet;ambulating;with min assist Pt Will Perform Toileting - Clothing Manipulation and hygiene: with modified independence Pt/caregiver will Perform Home Exercise Program: Increased strength;Increased ROM;Independently;Both right and left upper extremity Additional ADL Goal #1: pt will perform bed mobility min A in prep for ADLs  Plan      Co-evaluation    PT/OT/SLP Co-Evaluation/Treatment: Yes Reason for Co-Treatment: For patient/therapist safety;To address functional/ADL transfers   OT goals addressed during session: ADL's and self-care      AM-PAC OT 6 Clicks Daily Activity     Outcome Measure   Help from another person eating meals?: A Little Help from another person taking care of personal grooming?: A Little Help from another person toileting, which includes using toliet, bedpan, or urinal?: Total Help from another person bathing (including washing, rinsing, drying)?: A Lot Help from another person to put on and taking off regular upper body clothing?: A Lot Help from another person to put on and taking off regular lower body clothing?: Total 6 Click Score: 12    End of Session Equipment Utilized During Treatment: Oxygen  OT Visit  Diagnosis: Unsteadiness on feet (R26.81);Other abnormalities of gait and mobility (R26.89);Muscle weakness (generalized) (M62.81);Pain   Activity Tolerance Patient limited by pain   Patient Left in bed;with call bell/phone within reach;with nursing/sitter in room   Nurse Communication Mobility status        Time: 8873-8847 OT Time Calculation (min): 26 min  Charges: OT General Charges $OT Visit: 1 Visit OT Treatments $Therapeutic Activity: 8-22 mins  Mliss NOVAK, OTR/L Acute Rehab Services Office: [phone]   Mliss Fish 06/19/2024, 12:09 PM

## 2024-06-19 NOTE — Progress Notes (Signed)
 PROGRESS NOTE    Christina Castaneda  FMW:[id number] DOB: 1956-05-24 DOA: 06/07/2024 PCP: [name], DO   Chief Complaint  Patient presents with   Fall    Brief Narrative:    68 yo F PMH perf sigmoid colon s/p hartmann, colostomy revision, HTN who was admitted to TRH at [facility] 6/15 after presenting for a fall. Found to have a L distal femur fx related to the fall. On admission,  Labs significant for AKI w hyperK. She was medically optimized and then underwent retrograde L femoral nail.  On 6/23 overnight she had a drop in hgb to 6.2 -- associated dark ostomy output and increasing abdominal pain. Transfused. Subsequent hypotension requiring pressor initiation. Went for CTA which was concerning for perforated bleeding ulcer with air bubbles adjacent to duodenal bulb and layering of high attenuation material c/f active extrav.  CCS was consulted and pt went to OR for ex lap, on NE and vaso, having received 3 PRBC 1 plt   PCCM is consulted pre-op for transfer to [place] post operatively    Significant Hospital Events: Including procedures, antibiotic start and stop dates in addition to other pertinent events   6/15 admit to TRH after fall days earlier, L femur fx and AKI 6/20 OR w ortho 6/23 hgb drop, shock -- found to have perforated bleeding duodenal ulcer  6/24 extubated 6/25 off pressors, txf out of ICU  6/25 1 unit PRBC  Assessment & Plan:   Principal Problem:   Peptic ulcer perforation and hemorrhage (HCC) Active Problems:   AKI (acute kidney injury) (HCC)   Leukocytosis   Hypothyroid   COPD  GOLD 2/AB   OSA (obstructive sleep apnea)   Obesity   Tobacco abuse   Essential hypertension   Acute blood loss anemia   Generalized weakness   Chronic pain syndrome   Morbid obesity due to excess calories (HCC)   S/P colostomy (HCC)   Chronic venous insufficiency   Cigarette smoker   Sepsis (HCC)   Femur fracture, left (HCC)   Hemorrhagic shock (HCC)   Thrombocytopenia  (HCC)   Duodenal ulcer   Intra-abdominal adhesions   Severe sepsis (improved shock) 2/2 intraabdominal infection r/t below  Pneumoperitoneum Perforated bleeding duodenal ulcer s/p ex lap, graham patch repair, LOA (6/23 at [facility])  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  . - NGT discontinued  - Continue with IV Protonix   -upper GI single barium study 6/26 with no evidence of leak, started on clear liquid, advance to full liquid diet today. - Continue with zosyn  x 5 days postoperatively - Patient with severe malnutrition, albumin<1.5, started on TPN, Ackley can discontinue in 48 hours once her diet is advanced -h pylori testing ordered by CCS  - IV Protonix  40 mg twice daily. -Cleared to resume DVT prophylaxis per general surgery, will resume with close monitoring of her CBC   Chronic hypoxic respiratory failure  COPD OSA on CPAP  Tobacco use  -pulm hygiene she was encouraged with incentive spirometer. -O2 for goal > 88  - -at baseline she uses O2 at night    Anemia -- ABLA + critical illness /hemorrhagic shock Thrombocytopenia, mild  - Acute blood loss anemia from perforated ulcer,. - Transfuse 1 unit PRBC yesterday, with good response, hemoglobin is 8.3, continue to monitor closely and transfuse as needed . -if she were to bleed, would need IR    Fall L femur fx s/p retrograde L  femoral nail on 6/20 -appreciate ortho recs 6/21 -PT when appropriate   AKI, improving Urinary retention  -follow renal indices, UOP  -optimize lytes  -cont foley for now -- wont be able to give retention meds as she is NPO    Mild hypoglycemia -incr d5LR to 150/hr and cont CBG monitoring    Hypothyroidism - Will resume Synthroid  now she can take oral.     HLD HTN -statin on hold while npo -holding antihypertensives, resume (IV as she is NPO)    Stage 3 sacral pressure injury POA -woc consult   Obesity class II Body mass index is 45.05  kg/m.  Severe protein calorie malnutrition - With albumin level> 1.5, patient input appreciated, continue with TPN, hopefully can discontinue in 48 hours when she is on soft diet with improved oral intake.     DVT prophylaxis: SCD, will start subcu heparin  today Code Status: Full code Family Communication: None at bedside  Status is: Inpatient    Consultants:  PCCM Surgery Orthopedic Gastrointestinal  Subjective:  Tolerating clear liquid diet, otherwise denies any complaints  Objective: Vitals:   06/19/24 0330 06/19/24 0423 06/19/24 0819 06/19/24 1226  BP: (!) 114/55  (!) 114/54 116/62  Pulse:    (!) 58  Resp:    13  Temp: 98.7 F (37.1 C)  98.7 F (37.1 C) 97.9 F (36.6 C)  TempSrc: Oral  Oral Oral  SpO2:    100%  Weight:  122.8 kg    Height:        Intake/Output Summary (Last 24 hours) at 06/19/2024 1334 Last data filed at 06/19/2024 1130 Gross per 24 hour  Intake --  Output 1690 ml  Net -1690 ml   Filed Weights   06/12/24 1119 06/15/24 1209 06/19/24 0423  Weight: 105 kg 105 kg 122.8 kg    Examination:  Awake Alert, Oriented X 3, extremely frail, deconditioned and ill-appearing Symmetrical Chest wall movement, diminished air entry RRR,No Gallops,Rubs or new Murmurs, No Parasternal Heave +ve B.Sounds, colostomy present, surgical wound bandaged, sanguinous output in JP drain  no Cyanosis, anasarca    Data Reviewed: I have personally reviewed following labs and imaging studies  CBC: Recent Labs  Lab 06/15/24 2142 06/16/24 0415 06/17/24 0425 06/17/24 1330 06/18/24 0031 06/19/24 0405  WBC 27.6* 25.1* 13.5*  --  10.3 7.8  HGB 8.7* 8.2* 6.2* 6.7* 8.3* 8.0*  HCT 25.9* 25.2* 19.3* 20.4* 25.9* 25.3*  MCV 95.2 95.8 101.0*  --  97.7 100.8*  PLT 166 152 125*  --  133* 103*    Basic Metabolic Panel: Recent Labs  Lab 06/14/24 0309 06/15/24 0154 06/15/24 2127 06/15/24 2142 06/16/24 0415 06/17/24 0425 06/18/24 0031 06/19/24 0405  NA 134* 136    < > 135 134* 136 142 144  K 4.4 4.7   < > 4.6 4.6 3.7 3.7 4.1  CL 104 102  --  106 107 107 110 110  CO2 25 21*  --  22 22 25 25 28   GLUCOSE 82 112*  --  129* 118* 88 81 85  BUN 45* 72*  --  84* 84* 68* 44* 26*  CREATININE 1.18* 1.29*  --  1.54* 1.56* 1.44* 1.30* 1.14*  CALCIUM  8.5* 8.9  --  7.9* 7.5* 7.5* 7.7* 7.9*  MG 1.5* 2.2  --  1.8 1.9  --   --  1.7  PHOS  --   --   --   --   --   --   --  2.4*   < > = values in this interval not displayed.    GFR: Estimated Creatinine Clearance: 60.4 mL/min (A) (by C-G formula based on SCr of 1.14 mg/dL (H)).  Liver Function Tests: Recent Labs  Lab 06/15/24 2142 06/16/24 0415 06/17/24 0425 06/18/24 0031 06/19/24 0405  AST 93* 70* 39 52* 64*  ALT 41 36 23 26 29   ALKPHOS 39 41 33* 38 40  BILITOT 0.9 0.9 0.8 0.7 0.6  PROT 3.8* 3.9* 3.7* 4.2* 4.0*  ALBUMIN <1.5* <1.5* <1.5* <1.5* <1.5*    CBG: Recent Labs  Lab 06/18/24 2001 06/18/24 2314 06/19/24 0331 06/19/24 0822 06/19/24 1232  GLUCAP 105* 85 88 87 104*     Recent Results (from the past 240 hours)  Surgical pcr screen     Status: None   Collection Time: 06/10/24  4:00 PM   Specimen: Nasal Mucosa; Nasal Swab  Result Value Ref Range Status   MRSA, PCR NEGATIVE NEGATIVE Final   Staphylococcus aureus NEGATIVE NEGATIVE Final    Comment: (NOTE) The Xpert SA Assay (FDA approved for NASAL specimens in patients 75 years of age and older), is one component of a comprehensive surveillance program. It is not intended to diagnose infection nor to guide or monitor treatment. Performed at [facility], [address], [place], [place] [address]   MRSA Next Gen by PCR, Nasal     Status: None   Collection Time: 06/15/24  8:01 AM   Specimen: Nasal Mucosa; Nasal Swab  Result Value Ref Range Status   MRSA by PCR Next Gen NOT DETECTED NOT DETECTED Final    Comment: (NOTE) The GeneXpert MRSA Assay (FDA approved for NASAL specimens only), is one component of a comprehensive MRSA  colonization surveillance program. It is not intended to diagnose MRSA infection nor to guide or monitor treatment for MRSA infections. Test performance is not FDA approved in patients less than 15 years old. Performed at [facility], [address] [place], [place] [address]   Aerobic/Anaerobic Culture w Gram Stain (surgical/deep wound)     Status: None (Preliminary result)   Collection Time: 06/15/24  3:24 PM   Specimen: Path fluid; Body Fluid  Result Value Ref Range Status   Specimen Description   Final    PERITONEAL Performed at [facility], [address], [place], [place] [address]    Special Requests   Final    NONE Performed at [facility], [address] [place], [place] [address]    Gram Stain   Final    RARE WBC PRESENT, PREDOMINANTLY PMN NO ORGANISMS SEEN Performed at [facility], [address], [place], [place] [address]    Culture   Final    ABUNDANT CANDIDA ALBICANS MODERATE CANDIDA GLABRATA NO ANAEROBES ISOLATED; CULTURE IN PROGRESS FOR 5 DAYS    Report Status PENDING  Incomplete  Culture, blood (Routine X 2) w Reflex to ID Panel     Status: None (Preliminary result)   Collection Time: 06/15/24  5:13 PM   Specimen: BLOOD  Result Value Ref Range Status   Specimen Description BLOOD LEFT ANTECUBITAL  Final   Special Requests   Final    BOTTLES DRAWN AEROBIC AND ANAEROBIC Blood Culture adequate volume   Culture   Final    NO GROWTH 4 DAYS Performed at [facility], [address], [place], [place] [address]    Report Status PENDING  Incomplete  Culture, blood (Routine X 2) w Reflex to ID Panel  Status: None (Preliminary result)   Collection Time: 06/15/24  5:24 PM   Specimen: BLOOD  Result Value Ref Range Status   Specimen Description BLOOD LEFT ANTECUBITAL  Final   Special Requests   Final    BOTTLES DRAWN AEROBIC AND ANAEROBIC Blood Culture results may not be optimal due to an inadequate volume of blood received in culture bottles   Culture    Final    NO GROWTH 4 DAYS Performed at [facility], [address], [place], [place] [address]    Report Status PENDING  Incomplete         Radiology Studies: DG UGI W SINGLE CM (SOL OR THIN BA) Result Date: 06/18/2024 CLINICAL DATA:  Provided history: Perforated ulcer. Additional history obtained from electronic MEDICAL RECORD NUMBERStatus post Arlyss patch repair of perforated proximal duodenal ulcer. EXAM: UPPER GI SERIES WITH KUB TECHNIQUE: A scout radiograph was acquired. Subsequently, a problem-oriented water -soluble contrast upper GI series was performed to assess for contrast leak status post Arlyss patch repair of a perforated proximal duodenal ulcer. The exam was performed by [place], NP, and was supervised and interpreted by Dr. Rockey Childs. FLUOROSCOPY: FLUOROSCOPY Radiation Exposure Index (as provided by the fluoroscopic device): 27.60 mGy Kerma COMPARISON:  CT angiogram of the abdomen/pelvis with bilateral runoff 06/15/2024. FINDINGS: A scout radiograph of the abdomen was acquired. Nasogastric tube terminating in the stomach. Abdominal surgical drain. Overlying skin staples. Nonobstructive bowel gas pattern within the visible abdomen. Subsequently under intermittent fluoroscopy, contrast was hand injected via the patient's nasogastric tube. Contrast opacified the stomach and promptly passed into the proximal small bowel. Irregular contrast collection at the level the gastric antrum/proximal duodenum, likely reflecting the ulcer/surgical site. No evidence of contrast leak. IMPRESSION: 1. Problem-oriented water -soluble contrast upper GI series performed to assess for contrast leak status post Arlyss patch repair of a perforated proximal duodenal ulcer. 2. Irregular contrast collection at the level of the gastric antrum/proximal duodenum likely reflecting the ulcer/surgical site. No evidence of contrast leak. Electronically Signed   By: Rockey Childs D.O.   On: 06/18/2024 11:20   US   EKG SITE RITE Result Date: 06/18/2024 If Site Rite image not attached, placement could not be confirmed due to current cardiac rhythm.       Scheduled Meds:  sodium chloride    Intravenous Once   sodium chloride    Intravenous Once   sodium chloride    Intravenous Once   arformoterol   15 mcg Nebulization Q12H   budesonide  (PULMICORT ) nebulizer solution  0.5 mg Nebulization BID   Chlorhexidine  Gluconate Cloth  6 each Topical Daily   feeding supplement  1 Container Oral TID BM   leptospermum manuka honey  1 Application Topical Daily   nystatin    Topical BID   pantoprazole  (PROTONIX ) IV  40 mg Intravenous Q12H   sodium chloride  flush  10-40 mL Intracatheter Q12H   sodium chloride  flush  3 mL Intravenous Q12H   Continuous Infusions:  piperacillin -tazobactam (ZOSYN )  IV 3.375 g (06/19/24 0630)   potassium PHOSPHATE IVPB (in mmol) 20 mmol (06/19/24 1152)   TPN ADULT (ION) 40 mL/hr at 06/18/24 1726   TPN ADULT (ION)       LOS: 12 days      Brayton Lye, MD [facility]   To contact the attending provider between 7A-7P or the covering provider during after hours 7P-7A, please log into the web site [email or web address] and access using universal Emma password for that web site. If you do not have the  password, please call the hospital operator.  06/19/2024, 1:34 PM

## 2024-06-19 NOTE — Progress Notes (Signed)
 Patient ID: Christina Castaneda, female   DOB: September 29, 1956, 68 y.o.   MRN: [id number]   Acute Care Surgery Service Progress Note:    Chief Complaint/Subjective: Patient thirsty, otherwise no complaints.  Objective: Vital signs in last 24 hours: Temp:  [97.6 F (36.4 C)-98.7 F (37.1 C)] 98.7 F (37.1 C) (06/27 0819) Pulse Rate:  [57-63] 58 (06/26 1553) Resp:  [15-22] 16 (06/26 1553) BP: (112-115)/(42-60) 114/54 (06/27 0819) SpO2:  [81 %-100 %] 98 % (06/26 1553) FiO2 (%):  [28 %] 28 % (06/26 2048) Weight:  [122.8 kg] 122.8 kg (06/27 0423) Last BM Date : 06/18/24  Intake/Output from previous day: 06/26 0701 - 06/27 0700 In: 0  Out: 1300 [Urine:1300] Intake/Output this shift: Total I/O In: -  Out: 300 [Urine:300]  Pulm: nonlabored  Cardiovascular: reg  Abd: soft obese, JP - serosanguinous  Extremities:anasarca, +SCDs  Neuro: alert, nonfocal  Lab Results: CBC  Recent Labs    06/18/24 0031 06/19/24 0405  WBC 10.3 7.8  HGB 8.3* 8.0*  HCT 25.9* 25.3*  PLT 133* 103*   BMET Recent Labs    06/18/24 0031 06/19/24 0405  NA 142 144  K 3.7 4.1  CL 110 110  CO2 25 28  GLUCOSE 81 85  BUN 44* 26*  CREATININE 1.30* 1.14*  CALCIUM  7.7* 7.9*   LFT    Latest Ref Rng & Units 06/19/2024    4:05 AM 06/18/2024   12:31 AM 06/17/2024    4:25 AM  Hepatic Function  Total Protein 6.5 - 8.1 g/dL 4.0  4.2  3.7   Albumin 3.5 - 5.0 g/dL <8.4  <8.4  <8.4   AST 15 - 41 U/L 64  52  39   ALT 0 - 44 U/L 29  26  23    Alk Phosphatase 38 - 126 U/L 40  38  33   Total Bilirubin 0.0 - 1.2 mg/dL 0.6  0.7  0.8    PT/INR No results for input(s): LABPROT, INR in the last 72 hours.  ABG No results for input(s): PHART, HCO3 in the last 72 hours.  Invalid input(s): PCO2, PO2   Studies/Results:  Anti-infectives: Anti-infectives (From admission, onward)    Start     Dose/Rate Route Frequency Ordered Stop   06/16/24 1800  vancomycin  (VANCOCIN ) IVPB 1000 mg/200 mL premix   Status:  Discontinued        1,000 mg 200 mL/hr over 60 Minutes Intravenous Every 24 hours 06/15/24 1317 06/15/24 1328   06/15/24 1700  vancomycin  (VANCOREADY) IVPB 2000 mg/400 mL  Status:  Discontinued        2,000 mg 200 mL/hr over 120 Minutes Intravenous  Once 06/15/24 1317 06/15/24 1328   06/15/24 1600  fluconazole  (DIFLUCAN ) IVPB 400 mg  Status:  Discontinued        400 mg 100 mL/hr over 120 Minutes Intravenous Every 24 hours 06/15/24 1317 06/16/24 0914   06/15/24 1500  metroNIDAZOLE  (FLAGYL ) IVPB 500 mg  Status:  Discontinued        500 mg 100 mL/hr over 60 Minutes Intravenous Every 12 hours 06/15/24 1216 06/15/24 1255   06/15/24 1400  ciprofloxacin  (CIPRO ) IVPB 400 mg  Status:  Discontinued        400 mg 200 mL/hr over 60 Minutes Intravenous Every 12 hours 06/15/24 1216 06/15/24 1255   06/15/24 1400  piperacillin -tazobactam (ZOSYN ) IVPB 3.375 g        3.375 g 12.5 mL/hr over 240 Minutes Intravenous Every 8 hours 06/15/24 1317  06/15/24 1400  vancomycin  (VANCOCIN ) IVPB 1000 mg/200 mL premix  Status:  Discontinued        1,000 mg 200 mL/hr over 60 Minutes Intravenous Every 24 hours 06/15/24 1328 06/16/24 0914   06/15/24 1333  vancomycin  (VANCOCIN ) 1-5 GM/200ML-% IVPB       Note to Pharmacy: Romona Shaver E: cabinet override      06/15/24 1333 06/15/24 1410   06/15/24 1221  ciprofloxacin  (CIPRO ) 400 MG/200ML IVPB  Status:  Discontinued       Note to Pharmacy: Dannielle Railing S: cabinet override      06/15/24 1221 06/15/24 1343   06/15/24 1221  metroNIDAZOLE  (FLAGYL ) 500 MG/100ML IVPB  Status:  Discontinued       Note to Pharmacy: Dannielle Railing S: cabinet override      06/15/24 1221 06/15/24 1343   06/12/24 1223  clindamycin  (CLEOCIN ) 900 MG/50ML IVPB       Note to Pharmacy: Elaine Nest L: cabinet override      06/12/24 1223 06/12/24 1332   06/12/24 1131  ceFAZolin  (ANCEF ) 2-4 GM/100ML-% IVPB  Status:  Discontinued       Note to Pharmacy: Billy Sluder H: cabinet  override      06/12/24 1131 06/12/24 1434   06/11/24 1300  clindamycin  (CLEOCIN ) IVPB 900 mg  Status:  Discontinued        900 mg 100 mL/hr over 30 Minutes Intravenous On call to O.R. 06/10/24 1041 06/10/24 1447   06/11/24 1300  ceFAZolin  (ANCEF ) IVPB 2g/100 mL premix        2 g 200 mL/hr over 30 Minutes Intravenous On call to O.R. 06/10/24 1447 06/12/24 1908       Medications: Scheduled Meds:  sodium chloride    Intravenous Once   sodium chloride    Intravenous Once   sodium chloride    Intravenous Once   arformoterol   15 mcg Nebulization Q12H   budesonide  (PULMICORT ) nebulizer solution  0.5 mg Nebulization BID   Chlorhexidine  Gluconate Cloth  6 each Topical Daily   feeding supplement  1 Container Oral TID BM   insulin  aspart  0-9 Units Subcutaneous Q4H   leptospermum manuka honey  1 Application Topical Daily   nystatin    Topical BID   pantoprazole  (PROTONIX ) IV  40 mg Intravenous Q12H   sodium chloride  flush  10-40 mL Intracatheter Q12H   sodium chloride  flush  3 mL Intravenous Q12H   Continuous Infusions:  piperacillin -tazobactam (ZOSYN )  IV 3.375 g (06/19/24 0630)   TPN ADULT (ION) 40 mL/hr at 06/18/24 1726   PRN Meds:.dextrose , fentaNYL  (SUBLIMAZE ) injection, iohexol , ipratropium-albuterol , magic mouthwash w/lidocaine , [DISCONTINUED] ondansetron  **OR** ondansetron  (ZOFRAN ) IV, mouth rinse, oxyCODONE , polyethylene glycol, prochlorperazine , sodium chloride  flush, sodium chloride  flush  Assessment/Plan: Patient Active Problem List   Diagnosis Date Noted   Intra-abdominal adhesions 06/16/2024   Hemorrhagic shock (HCC) 06/15/2024   Peptic ulcer perforation and hemorrhage (HCC) 06/15/2024   Leukocytosis 06/15/2024   Thrombocytopenia (HCC) 06/15/2024   Duodenal ulcer 06/15/2024   Femur fracture, left (HCC) 06/07/2024   AKI (acute kidney injury) (HCC) 05/27/2024    Class: Acute   Hyperlipidemia 05/27/2024   Sepsis (HCC) 05/27/2024   Cigarette smoker 01/04/2023   Chronic  venous insufficiency 02/21/2017   Lymphedema 02/21/2017   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   Adjustment disorder with mixed anxiety and depressed mood    Debility 05/02/2016   Edema    Generalized weakness    Chronic pain syndrome    Hypoalbuminemia due to protein-calorie malnutrition (HCC)  OSA (obstructive sleep apnea)    Morbid obesity due to excess calories (HCC)    S/P colostomy (HCC)    Tracheostomy status (HCC)    Tracheostomy care (HCC)    COPD  GOLD 2/AB    Tobacco abuse    Essential hypertension    Bilateral knee pain    Dysphagia    Acute blood loss anemia    Hyponatremia    Thyroid  activity decreased    Acute respiratory failure (HCC)    Pressure ulcer 04/01/2016   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 Johanna at [facility] 6/23  -looks well today -s/p 2u prbc since tx to [facility]. JP serosang - no active bleeding.  If she were to start actively bleeding would likely need IR eval for angioembo -cont abx therapy, WBC normalized -cont bid PPI -h. Pylori test pending -multi-modal pain control -PT, OOB as able pending femur fx recs -upper GI - 6/26- no leak, some outpouching - likely area of cratered ulcer   FEN - advance to full liquids, TRH starting TPN, don't anti need for prolonged TPN unless develops leak VTE - on hold due to anemia, overall stable (8.0 from 8.3) would strongly consider restarting, ok from surgical perspective. ID - zosyn  - would stop on POD 5;    I reviewed TRH notes, last 24 h vitals and pain scores, last 48 h intake and output, last 24 h labs and trends, and last 24 h imaging results.    LOS: 10 days  Disposition:  LOS: 12 days   Orie Silversmith,  MD [facility] Surgery

## 2024-06-19 NOTE — Progress Notes (Signed)
 Physical Therapy Treatment Patient Details Name: Christina Castaneda MRN: [id number] DOB: 11/12/1956 Today's Date: 06/19/2024   History of Present Illness Pt is a 68 y/o F presenting to [facility] ED on 6/15 after fall, found to have L femoral neck fx and AKI. S/p retrograde IMN on 6/20. Hgb drop on 6/23 with dark ostomy output, transfused, subsequent hypotension requiring pressors. Found to have perforated bleeding duodenal ulcer s/p ex lap on 6/23. Transferred to [facility] for PCCM. 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    PT Comments  Pt's mobility continues to be limited by pain and fatigue. Performed BLE exercises in supine prior to being seen in session with OT for bed mobility. +2 max assist rolling R/L using bedrails for RN to perform dressing change and bed linen change. Pt able to assist with scooting up using headboard with bed in trendelenburg. Pt remained supine in bed at end of session.     If plan is discharge home, recommend the following: Two people to help with walking and/or transfers;Two people to help with bathing/dressing/bathroom   Can travel by private vehicle     No  Equipment Recommendations  None recommended by PT    Recommendations for Other Services       Precautions / Restrictions Precautions Precautions: Fall;Other (comment) Recall of Precautions/Restrictions: Intact Precaution/Restrictions Comments: JP drain, colostomy, sacral wound Required Braces or Orthoses: Knee Immobilizer - Left Restrictions LLE Weight Bearing Per Provider Order: Weight bearing as tolerated     Mobility  Bed Mobility Overal bed mobility: Needs Assistance Bed Mobility: Rolling Rolling: Max assist, +2 for physical assistance, Used rails         General bed mobility comments: rolling R/L for wound care (RN) and linen change.    Transfers                   General transfer comment: Pt declining EOB/OOB due to pain and  fatigue.    Ambulation/Gait                   Stairs             Wheelchair Mobility     Tilt Bed    Modified Rankin (Stroke Patients Only)       Balance                                            Communication Communication Communication: No apparent difficulties  Cognition Arousal: Alert Behavior During Therapy: WFL for tasks assessed/performed   PT - Cognitive impairments: No apparent impairments                         Following commands: Intact      Cueing Cueing Techniques: Verbal cues  Exercises General Exercises - Lower Extremity Ankle Circles/Pumps: AROM, Both, 20 reps, Supine Gluteal Sets: AROM, Both, 10 reps, Supine Heel Slides: AAROM, 10 reps, Supine, Right Hip ABduction/ADduction: AAROM, Right, Left, 10 reps, Supine    General Comments        Pertinent Vitals/Pain Pain Assessment Pain Assessment: Faces Faces Pain Scale: Hurts even more Pain Location: abdomen, bottom with wound care Pain Descriptors / Indicators: Guarding, Grimacing, Sore Pain Intervention(s): Monitored during session, Repositioned, RN gave pain meds during session    Home Living  Prior Function            PT Goals (current goals can now be found in the care plan section) Acute Rehab PT Goals Patient Stated Goal: home Progress towards PT goals: Progressing toward goals    Frequency    Min 3X/week      PT Plan      Co-evaluation   Reason for Co-Treatment: For patient/therapist safety;To address functional/ADL transfers PT goals addressed during session: Mobility/safety with mobility;Balance OT goals addressed during session: ADL's and self-care      AM-PAC PT 6 Clicks Mobility   Outcome Measure  Help needed turning from your back to your side while in a flat bed without using bedrails?: A Lot Help needed moving from lying on your back to sitting on the side of a flat bed  without using bedrails?: Total Help needed moving to and from a bed to a chair (including a wheelchair)?: Total Help needed standing up from a chair using your arms (e.g., wheelchair or bedside chair)?: Total Help needed to walk in hospital room?: Total Help needed climbing 3-5 steps with a railing? : Total 6 Click Score: 7    End of Session   Activity Tolerance: Patient limited by fatigue;Patient limited by pain Patient left: in bed;with call bell/phone within reach Nurse Communication: Mobility status PT Visit Diagnosis: Other abnormalities of gait and mobility (R26.89);History of falling (Z91.81);Muscle weakness (generalized) (M62.81);Pain     Time: 1128-1202 PT Time Calculation (min) (ACUTE ONLY): 34 min  Charges:    $Therapeutic Activity: 8-22 mins PT General Charges $$ ACUTE PT VISIT: 1 Visit                     Christina Castaneda., PT  Office # [phone]    Erven Christina Shaker 06/19/2024, 12:48 PM

## 2024-06-20 DIAGNOSIS — K276 Chronic or unspecified peptic ulcer, site unspecified, with both hemorrhage and perforation: Secondary | ICD-10-CM | POA: Diagnosis not present

## 2024-06-20 LAB — CBC
HCT: 26.3 % — ABNORMAL LOW (ref 36.0–46.0)
Hemoglobin: 8.2 g/dL — ABNORMAL LOW (ref 12.0–15.0)
MCH: 31.5 pg (ref 26.0–34.0)
MCHC: 31.2 g/dL (ref 30.0–36.0)
MCV: 101.2 fL — ABNORMAL HIGH (ref 80.0–100.0)
Platelets: UNDETERMINED 10*3/uL (ref 150–400)
RBC: 2.6 MIL/uL — ABNORMAL LOW (ref 3.87–5.11)
RDW: 16.6 % — ABNORMAL HIGH (ref 11.5–15.5)
WBC: 7.1 10*3/uL (ref 4.0–10.5)
nRBC: 0 % (ref 0.0–0.2)

## 2024-06-20 LAB — BASIC METABOLIC PANEL WITH GFR
Anion gap: 6 (ref 5–15)
BUN: 20 mg/dL (ref 8–23)
CO2: 27 mmol/L (ref 22–32)
Calcium: 7.7 mg/dL — ABNORMAL LOW (ref 8.9–10.3)
Chloride: 108 mmol/L (ref 98–111)
Creatinine, Ser: 1.08 mg/dL — ABNORMAL HIGH (ref 0.44–1.00)
GFR, Estimated: 55 mL/min — ABNORMAL LOW (ref >60)
Glucose, Bld: 94 mg/dL (ref 70–99)
Potassium: 4.4 mmol/L (ref 3.5–5.1)
Sodium: 141 mmol/L (ref 135–145)

## 2024-06-20 LAB — PHOSPHORUS: Phosphorus: 2.9 mg/dL (ref 2.5–4.6)

## 2024-06-20 LAB — AEROBIC/ANAEROBIC CULTURE W GRAM STAIN (SURGICAL/DEEP WOUND)

## 2024-06-20 LAB — CULTURE, BLOOD (ROUTINE X 2)
Culture: NO GROWTH
Culture: NO GROWTH
Special Requests: ADEQUATE

## 2024-06-20 LAB — MAGNESIUM: Magnesium: 2.1 mg/dL (ref 1.7–2.4)

## 2024-06-20 MED ORDER — TRAVASOL 10 % IV SOLN
INTRAVENOUS | Status: AC
  Filled 2024-06-20: qty 528

## 2024-06-20 MED ORDER — OXYCODONE HCL 5 MG PO TABS
5.0000 mg | ORAL_TABLET | Freq: Four times a day (QID) | ORAL | Status: AC | PRN
  Administered 2024-06-20 (×2): 5 mg via ORAL
  Filled 2024-06-20 (×2): qty 1

## 2024-06-20 NOTE — Progress Notes (Signed)
 PROGRESS NOTE    Christina Castaneda  FMW:[id number] DOB: [birth year] DOA: 06/07/2024 PCP: [name], DO   Chief Complaint  Patient presents with   Fall    Brief Narrative:    68 yo F PMH perf sigmoid colon s/p hartmann, colostomy revision, HTN who was admitted to TRH at [facility] 6/15 after presenting for a fall. Found to have a L distal femur fx related to the fall. On admission,  Labs significant for AKI w hyperK. She was medically optimized and then underwent retrograde L femoral nail.  On 6/23 overnight she had a drop in hgb to 6.2 -- associated dark ostomy output and increasing abdominal pain. Transfused. Subsequent hypotension requiring pressor initiation. Went for CTA which was concerning for perforated bleeding ulcer with air bubbles adjacent to duodenal bulb and layering of high attenuation material c/f active extrav.  CCS was consulted and pt went to OR for ex lap, on NE and vaso, having received 3 PRBC 1 plt   PCCM is consulted pre-op for transfer to [place] post operatively    Significant Hospital Events: Including procedures, antibiotic start and stop dates in addition to other pertinent events   6/15 admit to [facility]H after fall days earlier, L femur fx and AKI 6/20 OR w ortho 6/23 hgb drop, shock -- found to have perforated bleeding duodenal ulcer  6/24 extubated 6/25 off pressors, txf out of ICU  6/25 1 unit PRBC  Assessment & Plan:   Principal Problem:   Peptic ulcer perforation and hemorrhage (HCC) Active Problems:   AKI (acute kidney injury) (HCC)   Leukocytosis   Hypothyroid   COPD  GOLD 2/AB   OSA (obstructive sleep apnea)   Obesity   Tobacco abuse   Essential hypertension   Acute blood loss anemia   Generalized weakness   Chronic pain syndrome   Morbid obesity due to excess calories (HCC)   S/P colostomy (HCC)   Chronic venous insufficiency   Cigarette smoker   Sepsis (HCC)   Femur fracture, left (HCC)   Hemorrhagic shock (HCC)   Thrombocytopenia  (HCC)   Duodenal ulcer   Intra-abdominal adhesions   Severe sepsis (improved shock) 2/2 intraabdominal infection r/t below  Pneumoperitoneum Perforated bleeding duodenal ulcer s/p ex lap, graham patch repair, LOA (6/23 at [facility])  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  . - NGT discontinued  - Continue with IV Protonix   -upper GI single barium study 6/26 with no evidence of leak, started on clear liquid, advance to full liquid diet today. - Continue with zosyn  x 5 days postoperatively - Patient with severe malnutrition, albumin<1.5, started on TPN, can discontinue after 24 hours -h pylori testing ordered by CCS, still pending, discussed with staff to send. - IV Protonix  40 mg twice daily. -Cleared to resume DVT prophylaxis per general surgery, will resume with close monitoring of her CBC   Chronic hypoxic respiratory failure  COPD OSA on CPAP  Tobacco use  -pulm hygiene she was encouraged with incentive spirometer. -O2 for goal > 88  - -at baseline she uses O2 at night    Anemia -- ABLA + critical illness /hemorrhagic shock Thrombocytopenia, mild  - Acute blood loss anemia from perforated ulcer,. - Transfuse 1 unit PRBC yesterday, with good response, hemoglobin is 8.3, continue to monitor closely and transfuse as needed . -if she were to bleed, would need IR    Fall L femur fx s/p retrograde L  femoral nail on 6/20 -appreciate ortho recs 6/21 -PT when appropriate   AKI, improving Urinary retention  -follow renal indices, UOP  -optimize lytes  -cont foley for now -- wont be able to give retention meds as she is NPO    Mild hypoglycemia -incr d5LR to 150/hr and cont CBG monitoring    Hypothyroidism - Will resume Synthroid  now she can take oral.     HLD HTN -statin on hold while npo -holding antihypertensives, resume (IV as she is NPO)    Stage 3 sacral pressure injury POA -woc consult   Obesity class II Body mass  index is 44.43 kg/m.  Severe protein calorie malnutrition - With albumin level> 1.5, patient input appreciated, continue with TPN, hopefully can discontinue in 48 hours when she is on soft diet with improved oral intake.     DVT prophylaxis: SCD, will start subcu heparin  today Code Status: Full code Family Communication: None at bedside  Status is: Inpatient    Consultants:  PCCM Surgery Orthopedic Gastrointestinal  Subjective:  Tolerating clear liquid diet, denies any complaints.  Objective: Vitals:   06/20/24 0500 06/20/24 0800 06/20/24 0812 06/20/24 1124  BP:  (!) 119/56  (!) 110/45  Pulse:  (!) 55  68  Resp:  18  15  Temp:  98.1 F (36.7 C)  98.2 F (36.8 C)  TempSrc:  Oral  Oral  SpO2:  100% 97% 98%  Weight: 121.1 kg     Height:        Intake/Output Summary (Last 24 hours) at 06/20/2024 1220 Last data filed at 06/20/2024 1125 Gross per 24 hour  Intake 1822.79 ml  Output 2315 ml  Net -492.21 ml   Filed Weights   06/15/24 1209 06/19/24 0423 06/20/24 0500  Weight: 105 kg 122.8 kg 121.1 kg    Examination:  Awake Alert, Oriented X 3, extremely frail, deconditioned and ill-appearing Good air entry bilaterally, no wheezing Regular rate and rhythm +ve B.Sounds, colostomy present, surgical wound bandaged, sanguinous output in JP drain  no Cyanosis, anasarca    Data Reviewed: I have personally reviewed following labs and imaging studies  CBC: Recent Labs  Lab 06/16/24 0415 06/17/24 0425 06/17/24 1330 06/18/24 0031 06/19/24 0405 06/20/24 0428  WBC 25.1* 13.5*  --  10.3 7.8 7.1  HGB 8.2* 6.2* 6.7* 8.3* 8.0* 8.2*  HCT 25.2* 19.3* 20.4* 25.9* 25.3* 26.3*  MCV 95.8 101.0*  --  97.7 100.8* 101.2*  PLT 152 125*  --  133* 103* PLATELET CLUMPS NOTED ON SMEAR, UNABLE TO ESTIMATE    Basic Metabolic Panel: Recent Labs  Lab 06/15/24 0154 06/15/24 2127 06/15/24 2142 06/16/24 0415 06/17/24 0425 06/18/24 0031 06/19/24 0405 06/20/24 0428  NA 136   < >  135 134* 136 142 144 141  K 4.7   < > 4.6 4.6 3.7 3.7 4.1 4.4  CL 102  --  106 107 107 110 110 108  CO2 21*  --  22 22 25 25 28 27   GLUCOSE 112*  --  129* 118* 88 81 85 94  BUN 72*  --  84* 84* 68* 44* 26* 20  CREATININE 1.29*  --  1.54* 1.56* 1.44* 1.30* 1.14* 1.08*  CALCIUM  8.9  --  7.9* 7.5* 7.5* 7.7* 7.9* 7.7*  MG 2.2  --  1.8 1.9  --   --  1.7 2.1  PHOS  --   --   --   --   --   --  2.4* 2.9   < > =  values in this interval not displayed.    GFR: Estimated Creatinine Clearance: 63.2 mL/min (A) (by C-G formula based on SCr of 1.08 mg/dL (H)).  Liver Function Tests: Recent Labs  Lab 06/15/24 2142 06/16/24 0415 06/17/24 0425 06/18/24 0031 06/19/24 0405  AST 93* 70* 39 52* 64*  ALT 41 36 23 26 29   ALKPHOS 39 41 33* 38 40  BILITOT 0.9 0.9 0.8 0.7 0.6  PROT 3.8* 3.9* 3.7* 4.2* 4.0*  ALBUMIN <1.5* <1.5* <1.5* <1.5* <1.5*    CBG: Recent Labs  Lab 06/18/24 2314 06/19/24 0331 06/19/24 0822 06/19/24 1232 06/19/24 1702  GLUCAP 85 88 87 104* 94     Recent Results (from the past 240 hours)  Surgical pcr screen     Status: None   Collection Time: 06/10/24  4:00 PM   Specimen: Nasal Mucosa; Nasal Swab  Result Value Ref Range Status   MRSA, PCR NEGATIVE NEGATIVE Final   Staphylococcus aureus NEGATIVE NEGATIVE Final    Comment: (NOTE) The Xpert SA Assay (FDA approved for NASAL specimens in patients 66 years of age and older), is one component of a comprehensive surveillance program. It is not intended to diagnose infection nor to guide or monitor treatment. Performed at [facility], [address], [place], [place] [address]   MRSA Next Gen by PCR, Nasal     Status: None   Collection Time: 06/15/24  8:01 AM   Specimen: Nasal Mucosa; Nasal Swab  Result Value Ref Range Status   MRSA by PCR Next Gen NOT DETECTED NOT DETECTED Final    Comment: (NOTE) The GeneXpert MRSA Assay (FDA approved for NASAL specimens only), is one component of a comprehensive MRSA colonization  surveillance program. It is not intended to diagnose MRSA infection nor to guide or monitor treatment for MRSA infections. Test performance is not FDA approved in patients less than 50 years old. Performed at [facility], [address], [place], [place] [address]   Aerobic/Anaerobic Culture w Gram Stain (surgical/deep wound)     Status: None (Preliminary result)   Collection Time: 06/15/24  3:24 PM   Specimen: Path fluid; Body Fluid  Result Value Ref Range Status   Specimen Description   Final    PERITONEAL Performed at [facility], [address], [place], [place] [address]    Special Requests   Final    NONE Performed at [facility], [address], [place], [place] [address]    Gram Stain   Final    RARE WBC PRESENT, PREDOMINANTLY PMN NO ORGANISMS SEEN Performed at [facility], [address] [place], [place] [address]    Culture   Final    ABUNDANT CANDIDA ALBICANS MODERATE CANDIDA GLABRATA NO ANAEROBES ISOLATED; CULTURE IN PROGRESS FOR 5 DAYS    Report Status PENDING  Incomplete  Culture, blood (Routine X 2) w Reflex to ID Panel     Status: None   Collection Time: 06/15/24  5:13 PM   Specimen: BLOOD  Result Value Ref Range Status   Specimen Description BLOOD LEFT ANTECUBITAL  Final   Special Requests   Final    BOTTLES DRAWN AEROBIC AND ANAEROBIC Blood Culture adequate volume   Culture   Final    NO GROWTH 5 DAYS Performed at [facility], [address] [place], [place] [address]    Report Status 06/20/2024 FINAL  Final  Culture, blood (Routine X 2) w Reflex to ID Panel     Status: None   Collection Time:  06/15/24  5:24 PM   Specimen: BLOOD  Result Value Ref Range Status   Specimen Description BLOOD LEFT ANTECUBITAL  Final   Special Requests   Final    BOTTLES DRAWN AEROBIC AND ANAEROBIC Blood Culture results may not be optimal due to an inadequate volume of blood received in culture bottles   Culture   Final    NO GROWTH 5 DAYS Performed at [facility], [address], [place], [place] [address]    Report Status 06/20/2024 FINAL  Final         Radiology Studies: No results found.       Scheduled Meds:  arformoterol   15 mcg Nebulization Q12H   budesonide  (PULMICORT ) nebulizer solution  0.5 mg Nebulization BID   Chlorhexidine  Gluconate Cloth  6 each Topical Daily   feeding supplement  237 mL Oral BID BM   heparin  injection (subcutaneous)  5,000 Units Subcutaneous Q8H   leptospermum manuka honey  1 Application Topical Daily   levothyroxine   150 mcg Oral Q0600   nutrition supplement (JUVEN)  1 packet Oral BID AC & HS   nystatin    Topical BID   pantoprazole  (PROTONIX ) IV  40 mg Intravenous Q12H   sodium chloride  flush  10-40 mL Intracatheter Q12H   sodium chloride  flush  3 mL Intravenous Q12H   Continuous Infusions:  piperacillin -tazobactam (ZOSYN )  IV 3.375 g (06/20/24 0642)   TPN ADULT (ION) 40 mL/hr at 06/19/24 1859   TPN ADULT (ION)       LOS: 13 days      Brayton Lye, MD [facility]   To contact the attending provider between 7A-7P or the covering provider during after hours 7P-7A, please log into the web site [email or web address] and access using universal [place] password for that web site. If you do not have the password, please call the hospital operator.  06/20/2024, 12:20 PM

## 2024-06-20 NOTE — Plan of Care (Signed)
  Problem: Education: Goal: Knowledge of General Education information will improve Description: Including pain rating scale, medication(s)/side effects and non-pharmacologic comfort measures Outcome: Progressing   Problem: Health Behavior/Discharge Planning: Goal: Ability to manage health-related needs will improve Outcome: Progressing   Problem: Clinical Measurements: Goal: Ability to maintain clinical measurements within normal limits will improve Outcome: Progressing Goal: Will remain free from infection Outcome: Progressing Goal: Diagnostic test results will improve Outcome: Progressing Goal: Respiratory complications will improve Outcome: Progressing Goal: Cardiovascular complication will be avoided Outcome: Progressing   Problem: Activity: Goal: Risk for activity intolerance will decrease Outcome: Progressing   Problem: Nutrition: Goal: Adequate nutrition will be maintained Outcome: Progressing   Problem: Coping: Goal: Level of anxiety will decrease Outcome: Progressing   Problem: Elimination: Goal: Will not experience complications related to bowel motility Outcome: Progressing Goal: Will not experience complications related to urinary retention Outcome: Progressing   Problem: Pain Managment: Goal: General experience of comfort will improve and/or be controlled Outcome: Progressing   Problem: Safety: Goal: Ability to remain free from injury will improve Outcome: Progressing   Problem: Skin Integrity: Goal: Risk for impaired skin integrity will decrease Outcome: Progressing   Problem: Safety: Goal: Non-violent Restraint(s) Outcome: Progressing   Problem: Activity: Goal: Ability to tolerate increased activity will improve Outcome: Progressing   Problem: Respiratory: Goal: Ability to maintain a clear airway and adequate ventilation will improve Outcome: Progressing   Problem: Role Relationship: Goal: Method of communication will improve Outcome:  Progressing

## 2024-06-20 NOTE — Progress Notes (Signed)
    Assessment & Plan: POD#5 - s/p ex lap with graham patch repair and JP drain placement for Perforated duodenal ulcer, possible bleeding ulcer as well, Dr. Johanna at [facility] 6/23   - full liquid diet - no leak on UGI series  - JP with serosanguinous - cont bid PPI - h. Pylori test pending - multi-modal pain control - PT, OOB as able pending femur fx recs   FEN - FLD VTE - per medicine ID - zosyn  - would stop on POD 5        Krystal Spinner, MD [facility] practice Office: [phone]        Chief Complaint: Perforated ulcer  Subjective: Patient in bed, husband at bedside.  Pleasant.  Tolerating FLD.  Objective: Vital signs in last 24 hours: Temp:  [97.5 F (36.4 C)-98.2 F (36.8 C)] 98.2 F (36.8 C) (06/28 1124) Pulse Rate:  [51-68] 68 (06/28 1124) Resp:  [13-20] 15 (06/28 1124) BP: (110-122)/(45-64) 110/45 (06/28 1124) SpO2:  [93 %-100 %] 98 % (06/28 1124) FiO2 (%):  [32 %] 32 % (06/27 2249) Weight:  [121.1 kg] 121.1 kg (06/28 0500) Last BM Date : 06/19/24  Intake/Output from previous day: 06/27 0701 - 06/28 0700 In: 1822.8 [I.V.:1026.2; IV Piggyback:796.6] Out: 1580 [Urine:1000; Drains:380; Stool:200] Intake/Output this shift: Total I/O In: -  Out: 1275 [Urine:1150; Drains:125]  Physical Exam: HEENT - sclerae clear, mucous membranes moist Abdomen - soft, obese; ostomy with dark thin output; JP with serosanguinous output  Lab Results:  Recent Labs    06/19/24 0405 06/20/24 0428  WBC 7.8 7.1  HGB 8.0* 8.2*  HCT 25.3* 26.3*  PLT 103* PLATELET CLUMPS NOTED ON SMEAR, UNABLE TO ESTIMATE   BMET Recent Labs    06/19/24 0405 06/20/24 0428  NA 144 141  K 4.1 4.4  CL 110 108  CO2 28 27  GLUCOSE 85 94  BUN 26* 20  CREATININE 1.14* 1.08*  CALCIUM  7.9* 7.7*   PT/INR No results for input(s): LABPROT, INR in the last 72 hours. Comprehensive Metabolic Panel:    Component Value Date/Time   NA 141 06/20/2024 0428   NA 144  06/19/2024 0405   K 4.4 06/20/2024 0428   K 4.1 06/19/2024 0405   CL 108 06/20/2024 0428   CL 110 06/19/2024 0405   CO2 27 06/20/2024 0428   CO2 28 06/19/2024 0405   BUN 20 06/20/2024 0428   BUN 26 (H) 06/19/2024 0405   CREATININE 1.08 (H) 06/20/2024 0428   CREATININE 1.14 (H) 06/19/2024 0405   GLUCOSE 94 06/20/2024 0428   GLUCOSE 85 06/19/2024 0405   CALCIUM  7.7 (L) 06/20/2024 0428   CALCIUM  7.9 (L) 06/19/2024 0405   AST 64 (H) 06/19/2024 0405   AST 52 (H) 06/18/2024 0031   ALT 29 06/19/2024 0405   ALT 26 06/18/2024 0031   ALKPHOS 40 06/19/2024 0405   ALKPHOS 38 06/18/2024 0031   BILITOT 0.6 06/19/2024 0405   BILITOT 0.7 06/18/2024 0031   PROT 4.0 (L) 06/19/2024 0405   PROT 4.2 (L) 06/18/2024 0031   ALBUMIN <1.5 (L) 06/19/2024 0405   ALBUMIN <1.5 (L) 06/18/2024 0031    Studies/Results: No results found.    Krystal Spinner 06/20/2024  Patient ID: Christina Castaneda, female   DOB: 1956-08-31, 68 y.o.   MRN: [id number]

## 2024-06-20 NOTE — Progress Notes (Signed)
 PHARMACY - TOTAL PARENTERAL NUTRITION CONSULT NOTE  Indication: perforated ulcer unable to tolerate oral intake  Patient Measurements: Height: 5' 5 (165.1 cm) Weight: 121.1 kg (266 lb 15.6 oz) IBW/kg (Calculated) : 57 TPN AdjBW (KG): 69 Body mass index is 44.43 kg/m.  Assessment:  56 YOF s/p ex-lap, omental Graham patch repair of the duodenal ulcer, lysis of adhesions. She has a perforated ulcer unable to tolerate oral intake.  Pharmacy consulted to dose TPN.  Glucose / Insulin : no hx DM - CBGs < 180.  SSI/CBG checks D/C'ed 06/19/24. Electrolytes: Phos 2.9 and K 4.4 post KPhos 20 mmol IV, Mag 2.1 post 2gm IV, others WNL Renal: SCr down 1.08, BUN WNL Hepatic: LFTs WNL except mildly elevated AST, tbili / TG WNL, albumin < 1.5 Intake / Output; MIVF: UOP 0.3 ml/kg/hr, drains up to , colostomy GI Imaging: none since TPN initiation  GI Surgeries / Procedures: none since TPN initiation  Central access: PICC placed 6/26/5 TPN start date: 06/18/24  Nutritional Goals: Goal TPN rate is 70 mL/hr (provides 92g AA and 1619 kCal per day)  RD Estimated Needs Total Energy Estimated Needs: 1600-1800 Total Protein Estimated Needs: 85-100g Total Fluid Estimated Needs: >/=1.6L  Current Nutrition:  TPN Advance to FLD - consume 75% of what is offered per patient Resource Breeze TID - 1 charted given yesterday >> now off Ensure Plus High Protein BID - 1 charted given yesterday Juven BID - 1 charted given yesterday  Plan:  Continue TPN at current rate per MD Continue TPN at 40 mL/hr, providing 52g AA, 130g CHO, 27g ILE and 926 kCal, meeting ~60% of needs Electrolytes in TPN: reduce Na to 56mEq/L on 6/27, K 80 mEq/L, Ca 8 mEq/L, increase Mg to 70mEq/L and Phos to 30mmol/L on 6/27. Cl:Ac 1:1 Add standard MVI and trace elements to TPN Monitor TPN labs on Mon/Thurs - labs in AM F/u PO intake/diet advancement to wean TPN  Vannia Pola D. Lendell, PharmD, BCPS, BCCCP 06/20/2024, 11:02 AM

## 2024-06-21 DIAGNOSIS — K276 Chronic or unspecified peptic ulcer, site unspecified, with both hemorrhage and perforation: Secondary | ICD-10-CM | POA: Diagnosis not present

## 2024-06-21 LAB — CBC
HCT: 26 % — ABNORMAL LOW (ref 36.0–46.0)
Hemoglobin: 8.1 g/dL — ABNORMAL LOW (ref 12.0–15.0)
MCH: 31.6 pg (ref 26.0–34.0)
MCHC: 31.2 g/dL (ref 30.0–36.0)
MCV: 101.6 fL — ABNORMAL HIGH (ref 80.0–100.0)
Platelets: 139 10*3/uL — ABNORMAL LOW (ref 150–400)
RBC: 2.56 MIL/uL — ABNORMAL LOW (ref 3.87–5.11)
RDW: 16.3 % — ABNORMAL HIGH (ref 11.5–15.5)
WBC: 6.5 10*3/uL (ref 4.0–10.5)
nRBC: 0 % (ref 0.0–0.2)

## 2024-06-21 LAB — MAGNESIUM: Magnesium: 1.9 mg/dL (ref 1.7–2.4)

## 2024-06-21 LAB — BASIC METABOLIC PANEL WITH GFR
Anion gap: 9 (ref 5–15)
BUN: 26 mg/dL — ABNORMAL HIGH (ref 8–23)
CO2: 26 mmol/L (ref 22–32)
Calcium: 7.9 mg/dL — ABNORMAL LOW (ref 8.9–10.3)
Chloride: 102 mmol/L (ref 98–111)
Creatinine, Ser: 1.03 mg/dL — ABNORMAL HIGH (ref 0.44–1.00)
GFR, Estimated: 58 mL/min — ABNORMAL LOW (ref >60)
Glucose, Bld: 88 mg/dL (ref 70–99)
Potassium: 4.6 mmol/L (ref 3.5–5.1)
Sodium: 137 mmol/L (ref 135–145)

## 2024-06-21 LAB — PHOSPHORUS: Phosphorus: 3.1 mg/dL (ref 2.5–4.6)

## 2024-06-21 MED ORDER — TRAVASOL 10 % IV SOLN
INTRAVENOUS | Status: AC
  Filled 2024-06-21: qty 924

## 2024-06-21 MED ORDER — BUTALBITAL-APAP-CAFFEINE 50-325-40 MG PO TABS
1.0000 | ORAL_TABLET | Freq: Once | ORAL | Status: AC
  Administered 2024-06-21: 1 via ORAL
  Filled 2024-06-21: qty 1

## 2024-06-21 NOTE — Progress Notes (Addendum)
    Assessment & Plan: POD#6 - s/p ex lap with graham patch repair and JP drain placement for Perforated duodenal ulcer, possible bleeding ulcer as well, Dr. Johanna at [facility] 6/23   - No leak on upper GI series  - Advance to soft diet  - JP with serosanguinous, remove prior to discharge - cont bid PPI - h. Pylori test pending - multi-modal pain control - PT, OOB as able pending femur fx recs - Abx completed - Continues on TPN due to poor appetite             Chief Complaint: Perforated ulcer  Subjective: Tolerating liquids, denies pain after eating. Occasional nausea.  Objective: Vital signs in last 24 hours: Temp:  [97.5 F (36.4 C)-98.3 F (36.8 C)] 97.9 F (36.6 C) (06/29 1144) Pulse Rate:  [53-84] 84 (06/29 0943) Resp:  [15-19] 15 (06/29 0943) BP: (108-131)/(53-106) 116/56 (06/29 1144) SpO2:  [97 %-100 %] 97 % (06/29 0943) Weight:  [125.5 kg] 125.5 kg (06/29 0505) Last BM Date : 06/20/24  Intake/Output from previous day: 06/28 0701 - 06/29 0700 In: 2376 [P.O.:834; I.V.:1392; IV Piggyback:150] Out: 2365 [Urine:1550; Drains:315; Stool:500] Intake/Output this shift: Total I/O In: 10 [I.V.:10] Out: 1250 [Urine:1250]  Physical Exam: Abdomen - soft, obese; ostomy with dark thin output; JP with serosanguinous output. Midline incision clean and dry with honeycomb dressing in place.  Lab Results:  Recent Labs    06/20/24 0428 06/21/24 0420  WBC 7.1 6.5  HGB 8.2* 8.1*  HCT 26.3* 26.0*  PLT PLATELET CLUMPS NOTED ON SMEAR, UNABLE TO ESTIMATE 139*   BMET Recent Labs    06/20/24 0428 06/21/24 0420  NA 141 137  K 4.4 4.6  CL 108 102  CO2 27 26  GLUCOSE 94 88  BUN 20 26*  CREATININE 1.08* 1.03*  CALCIUM  7.7* 7.9*   PT/INR No results for input(s): LABPROT, INR in the last 72 hours. Comprehensive Metabolic Panel:    Component Value Date/Time   NA 137 06/21/2024 0420   NA 141 06/20/2024 0428   K 4.6 06/21/2024 0420   K 4.4 06/20/2024 0428   CL  102 06/21/2024 0420   CL 108 06/20/2024 0428   CO2 26 06/21/2024 0420   CO2 27 06/20/2024 0428   BUN 26 (H) 06/21/2024 0420   BUN 20 06/20/2024 0428   CREATININE 1.03 (H) 06/21/2024 0420   CREATININE 1.08 (H) 06/20/2024 0428   GLUCOSE 88 06/21/2024 0420   GLUCOSE 94 06/20/2024 0428   CALCIUM  7.9 (L) 06/21/2024 0420   CALCIUM  7.7 (L) 06/20/2024 0428   AST 64 (H) 06/19/2024 0405   AST 52 (H) 06/18/2024 0031   ALT 29 06/19/2024 0405   ALT 26 06/18/2024 0031   ALKPHOS 40 06/19/2024 0405   ALKPHOS 38 06/18/2024 0031   BILITOT 0.6 06/19/2024 0405   BILITOT 0.7 06/18/2024 0031   PROT 4.0 (L) 06/19/2024 0405   PROT 4.2 (L) 06/18/2024 0031   ALBUMIN <1.5 (L) 06/19/2024 0405   ALBUMIN <1.5 (L) 06/18/2024 0031    Studies/Results: No results found.    Christina Castaneda 06/21/2024  Patient ID: Christina Castaneda, female   DOB: 01-Jun-1956, 68 y.o.   MRN: [id number]

## 2024-06-21 NOTE — Progress Notes (Addendum)
 Christina NOTE    BAILI Christina Castaneda  FMW:[id number] DOB: March 05, 1956 DOA: 06/07/2024 PCP: [name], DO   Chief Complaint  Patient presents with   Fall    Brief Narrative:    68 yo F PMH perf sigmoid colon s/p hartmann, colostomy revision, HTN who was admitted to [facility] at [facility] 6/15 after presenting for a fall. Found to have a L distal femur fx related to the fall. On admission,  Labs significant for AKI w hyperK. She was medically optimized and then underwent retrograde L femoral nail.  On 6/23 overnight she had a drop in hgb to 6.2 -- associated dark ostomy output and increasing abdominal pain. Transfused. Subsequent hypotension requiring pressor initiation. Went for CTA which was concerning for perforated bleeding ulcer with air bubbles adjacent to duodenal bulb and layering of high attenuation material c/f active extrav.  CCS was consulted and pt went to OR for ex lap, on NE and vaso, having received 3 PRBC 1 plt   PCCM is consulted pre-op for transfer to [place] post operatively    Significant Hospital Events: Including procedures, antibiotic start and stop dates in addition to other pertinent events   6/15 admit to TRH after fall days earlier, L femur fx and AKI 6/20 OR w ortho 6/23 hgb drop, shock -- found to have perforated bleeding duodenal ulcer  6/24 extubated 6/25 off pressors, txf out of ICU  6/25 1 unit PRBC  Assessment & Plan:   Principal Problem:   Peptic ulcer perforation and hemorrhage (HCC) Active Problems:   AKI (acute kidney injury) (HCC)   Leukocytosis   Hypothyroid   COPD  GOLD 2/AB   OSA (obstructive sleep apnea)   Obesity   Tobacco abuse   Essential hypertension   Acute blood loss anemia   Generalized weakness   Chronic pain syndrome   Morbid obesity due to excess calories (HCC)   S/P colostomy (HCC)   Chronic venous insufficiency   Cigarette smoker   Sepsis (HCC)   Femur fracture, left (HCC)   Hemorrhagic shock (HCC)   Thrombocytopenia  (HCC)   Duodenal ulcer   Intra-abdominal adhesions   Severe sepsis (improved shock) 2/2 intraabdominal infection r/t below  Pneumoperitoneum Perforated bleeding duodenal ulcer s/p ex lap, graham patch repair, LOA (6/23 at [facility])  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  . - NGT discontinued  - Continue with IV Protonix   -upper GI single barium study 6/26 with no evidence of leak, started on clear liquid, advance to full liquid diet today. - Continue with zosyn  x 5 days postoperatively - Patient with severe malnutrition, albumin<1.5, started on TPN, she is advanced to full liquid diet, but she still with very poor oral intake and poor appetite, so we will continue with TPN.  Will change to full TPN up to goal today. -h pylori testing ordered by CCS, still pending, discussed with staff to send. - IV Protonix  40 mg twice daily. -Cleared to resume DVT prophylaxis per general surgery, currently on subcu heparin , monitor CBC closely and transfuse as needed.  Chronic hypoxic respiratory failure  COPD OSA on CPAP  Tobacco use  -pulm hygiene she was encouraged with incentive spirometer. -O2 for goal > 88  - -at baseline she uses O2 at night    Anemia -- ABLA + critical illness /hemorrhagic shock Thrombocytopenia, mild  - Acute blood loss anemia from perforated ulcer,. - Transfuse 1 unit PRBC yesterday, with good  response, hemoglobin is 8.3, continue to monitor closely and transfuse as needed . -if she were to bleed, would need IR    Fall L femur fx s/p retrograde L femoral nail on 6/20 -appreciate ortho recs 6/21 -PT when appropriate   AKI, improving Urinary retention  -follow renal indices, UOP  -optimize lytes  -cont foley for now -- wont be able to give retention meds as she is NPO    Mild hypoglycemia -incr d5LR to 150/hr and cont CBG monitoring    Hypothyroidism - Will resume Synthroid  now she can take oral.      HLD HTN -statin on hold while npo -holding antihypertensives, resume (IV as she is NPO)    Stage 3 sacral pressure injury POA -woc consult   Obesity class II Body mass index is 46.04 kg/m.  Severe protein calorie malnutrition - With albumin level> 1.5, patient input appreciated, continue with TPN, hopefully can discontinue in 48 hours when she is on soft diet with improved oral intake.     DVT prophylaxis: SCD, back on subcu heparin  6/28. Code Status: Full code Family Communication: None at bedside  Status is: Inpatient    Consultants:  PCCM Surgery Orthopedic Gastrointestinal  Subjective:  No nausea, no vomiting, tolerating her full liquid diet, but she reports poor appetite and oral intake, she reports mild headache as well.  Objective: Vitals:   06/21/24 0805 06/21/24 0806 06/21/24 0943 06/21/24 1144  BP:    (!) 116/56  Pulse:  (!) 55 84   Resp:  16 15   Temp:    97.9 F (36.6 C)  TempSrc:    Oral  SpO2: 100% 100% 97%   Weight:      Height:        Intake/Output Summary (Last 24 hours) at 06/21/2024 1241 Last data filed at 06/21/2024 0802 Gross per 24 hour  Intake 2148.96 ml  Output 2340 ml  Net -191.04 ml   Filed Weights   06/19/24 0423 06/20/24 0500 06/21/24 0505  Weight: 122.8 kg 121.1 kg 125.5 kg    Examination:  Awake Alert, Oriented X 3, extremely frail, deconditioned and ill-appearing She has some scattered wheezing today Regular rate and rhythm +ve B.Sounds, colostomy present, surgical wound bandaged, sanguinous output in JP drain  no Cyanosis, anasarca, left lower extremity with knee immobilizer    Data Reviewed: I have personally reviewed following labs and imaging studies  CBC: Recent Labs  Lab 06/17/24 0425 06/17/24 1330 06/18/24 0031 06/19/24 0405 06/20/24 0428 06/21/24 0420  WBC 13.5*  --  10.3 7.8 7.1 6.5  HGB 6.2* 6.7* 8.3* 8.0* 8.2* 8.1*  HCT 19.3* 20.4* 25.9* 25.3* 26.3* 26.0*  MCV 101.0*  --  97.7 100.8* 101.2*  101.6*  PLT 125*  --  133* 103* PLATELET CLUMPS NOTED ON SMEAR, UNABLE TO ESTIMATE 139*    Basic Metabolic Panel: Recent Labs  Lab 06/15/24 2142 06/16/24 0415 06/17/24 0425 06/18/24 0031 06/19/24 0405 06/20/24 0428 06/21/24 0420  NA 135 134* 136 142 144 141 137  K 4.6 4.6 3.7 3.7 4.1 4.4 4.6  CL 106 107 107 110 110 108 102  CO2 22 22 25 25 28 27 26   GLUCOSE 129* 118* 88 81 85 94 88  BUN 84* 84* 68* 44* 26* 20 26*  CREATININE 1.54* 1.56* 1.44* 1.30* 1.14* 1.08* 1.03*  CALCIUM  7.9* 7.5* 7.5* 7.7* 7.9* 7.7* 7.9*  MG 1.8 1.9  --   --  1.7 2.1 1.9  PHOS  --   --   --   --  2.4* 2.9 3.1    GFR: Estimated Creatinine Clearance: 67.7 mL/min (A) (by C-G formula based on SCr of 1.03 mg/dL (H)).  Liver Function Tests: Recent Labs  Lab 06/15/24 2142 06/16/24 0415 06/17/24 0425 06/18/24 0031 06/19/24 0405  AST 93* 70* 39 52* 64*  ALT 41 36 23 26 29   ALKPHOS 39 41 33* 38 40  BILITOT 0.9 0.9 0.8 0.7 0.6  PROT 3.8* 3.9* 3.7* 4.2* 4.0*  ALBUMIN <1.5* <1.5* <1.5* <1.5* <1.5*    CBG: Recent Labs  Lab 06/18/24 2314 06/19/24 0331 06/19/24 0822 06/19/24 1232 06/19/24 1702  GLUCAP 85 88 87 104* 94     Recent Results (from the past 240 hours)  MRSA Next Gen by PCR, Nasal     Status: None   Collection Time: 06/15/24  8:01 AM   Specimen: Nasal Mucosa; Nasal Swab  Result Value Ref Range Status   MRSA by PCR Next Gen NOT DETECTED NOT DETECTED Final    Comment: (NOTE) The GeneXpert MRSA Assay (FDA approved for NASAL specimens only), is one component of a comprehensive MRSA colonization surveillance program. It is not intended to diagnose MRSA infection nor to guide or monitor treatment for MRSA infections. Test performance is not FDA approved in patients less than 33 years old. Performed at [facility], [address], [place], [place] [address]   Aerobic/Anaerobic Culture w Gram Stain (surgical/deep wound)     Status: None   Collection Time: 06/15/24  3:24 PM   Specimen:  Path fluid; Body Fluid  Result Value Ref Range Status   Specimen Description   Final    PERITONEAL Performed at [facility], [address] [place], [place] [address]    Special Requests   Final    NONE Performed at [facility], [address] [place], [place] [address]    Gram Stain   Final    RARE WBC PRESENT, PREDOMINANTLY PMN NO ORGANISMS SEEN    Culture   Final    ABUNDANT CANDIDA ALBICANS MODERATE CANDIDA GLABRATA NO ANAEROBES ISOLATED Performed at [facility], [address], [place], [place] [address]    Report Status 06/20/2024 FINAL  Final  Culture, blood (Routine X 2) w Reflex to ID Panel     Status: None   Collection Time: 06/15/24  5:13 PM   Specimen: BLOOD  Result Value Ref Range Status   Specimen Description BLOOD LEFT ANTECUBITAL  Final   Special Requests   Final    BOTTLES DRAWN AEROBIC AND ANAEROBIC Blood Culture adequate volume   Culture   Final    NO GROWTH 5 DAYS Performed at [facility], [address], [place], [place] [address]    Report Status 06/20/2024 FINAL  Final  Culture, blood (Routine X 2) w Reflex to ID Panel     Status: None   Collection Time: 06/15/24  5:24 PM   Specimen: BLOOD  Result Value Ref Range Status   Specimen Description BLOOD LEFT ANTECUBITAL  Final   Special Requests   Final    BOTTLES DRAWN AEROBIC AND ANAEROBIC Blood Culture results may not be optimal due to an inadequate volume of blood received in culture bottles   Culture   Final    NO GROWTH 5 DAYS Performed at [facility], [address], [place], [place] [address]    Report Status 06/20/2024 FINAL  Final         Radiology Studies: No results found.       Scheduled Meds:  arformoterol   15 mcg Nebulization Q12H   budesonide  (PULMICORT ) nebulizer solution  0.5 mg Nebulization BID   Chlorhexidine  Gluconate Cloth  6 each Topical Daily   feeding supplement  237 mL Oral BID BM   heparin  injection (subcutaneous)  5,000 Units Subcutaneous Q8H    leptospermum manuka honey  1 Application Topical Daily   levothyroxine   150 mcg Oral Q0600   nutrition supplement (JUVEN)  1 packet Oral BID AC & HS   nystatin    Topical BID   pantoprazole  (PROTONIX ) IV  40 mg Intravenous Q12H   sodium chloride  flush  10-40 mL Intracatheter Q12H   sodium chloride  flush  3 mL Intravenous Q12H   Continuous Infusions:  TPN ADULT (ION) 40 mL/hr at 06/21/24 0600   TPN ADULT (ION)       LOS: 14 days      Brayton Lye, MD [facility]   To contact the attending provider between 7A-7P or the covering provider during after hours 7P-7A, please log into the web site [email or web address] and access using universal [place] password for that web site. If you do not have the password, please call the hospital operator.  06/21/2024, 12:41 PM

## 2024-06-21 NOTE — Plan of Care (Signed)
  Problem: Education: Goal: Knowledge of General Education information will improve Description: Including pain rating scale, medication(s)/side effects and non-pharmacologic comfort measures Outcome: Progressing   Problem: Health Behavior/Discharge Planning: Goal: Ability to manage health-related needs will improve Outcome: Progressing   Problem: Clinical Measurements: Goal: Ability to maintain clinical measurements within normal limits will improve Outcome: Progressing Goal: Will remain free from infection Outcome: Progressing Goal: Diagnostic test results will improve Outcome: Progressing Goal: Respiratory complications will improve Outcome: Progressing Goal: Cardiovascular complication will be avoided Outcome: Progressing   Problem: Activity: Goal: Risk for activity intolerance will decrease Outcome: Progressing   Problem: Nutrition: Goal: Adequate nutrition will be maintained Outcome: Progressing   Problem: Coping: Goal: Level of anxiety will decrease Outcome: Progressing   Problem: Elimination: Goal: Will not experience complications related to bowel motility Outcome: Progressing Goal: Will not experience complications related to urinary retention Outcome: Progressing   Problem: Pain Managment: Goal: General experience of comfort will improve and/or be controlled Outcome: Progressing   Problem: Safety: Goal: Ability to remain free from injury will improve Outcome: Progressing   Problem: Skin Integrity: Goal: Risk for impaired skin integrity will decrease Outcome: Progressing   Problem: Safety: Goal: Non-violent Restraint(s) Outcome: Progressing   Problem: Activity: Goal: Ability to tolerate increased activity will improve Outcome: Progressing   Problem: Respiratory: Goal: Ability to maintain a clear airway and adequate ventilation will improve Outcome: Progressing   Problem: Role Relationship: Goal: Method of communication will improve Outcome:  Progressing

## 2024-06-21 NOTE — Progress Notes (Signed)
 PHARMACY - TOTAL PARENTERAL NUTRITION CONSULT NOTE  Indication: perforated ulcer unable to tolerate oral intake  Patient Measurements: Height: 5' 5 (165.1 cm) Weight: 125.5 kg (276 lb 10.8 oz) IBW/kg (Calculated) : 57 TPN AdjBW (KG): 69 Body mass index is 46.04 kg/m.  Assessment:  66 YOF s/p ex-lap, omental Graham patch repair of the duodenal ulcer, lysis of adhesions. She has a perforated ulcer unable to tolerate oral intake.  Pharmacy consulted to dose TPN.  Glucose / Insulin : no hx DM - CBGs < 180.  SSI/CBG checks D/C'ed 06/19/24. Electrolytes: all WNL (K high normal) Renal: SCr down 1.03, BUN WNL Hepatic: LFTs WNL except mildly elevated AST, tbili / TG WNL, albumin < 1.5 Intake / Output; MIVF: UOP 0.5 ml/kg/hr, drains , colostomy GI Imaging: none since TPN initiation  GI Surgeries / Procedures: none since TPN initiation  Central access: PICC placed 06/18/24 TPN start date: 06/18/24  Nutritional Goals: Goal TPN rate is 70 mL/hr (provides 92g AA and 1619 kCal per day)  RD Estimated Needs Total Energy Estimated Needs: 1600-1800 Total Protein Estimated Needs: 85-100g Total Fluid Estimated Needs: >/=1.6L  Current Nutrition:  TPN Advance to FLD  Ensure Plus High Protein BID - 2 charted given yesterday Juven BID - 2 charted given yesterday  Plan:  Advance TPN to goal given poor appetite per MD Increase TPN to goal rate of 70 mL/hr to provide 100% of needs Electrolytes in TPN: Na 50mEq/L, reduce K to 35mEq/L, Ca 39mEq/L, Mag 59mEq/L, Phos 15mmol/L. Cl:Ac 1:1 - adjust lytes with increased TPN rate Add standard MVI and trace elements to TPN Monitor TPN labs on Mon/Thurs - f/u AM glucose, expect BG to be controlled despite increased TPN rate F/u PO intake/diet advancement   Christina Castaneda D. Lend[place], PharmD, BCPS, BCCCP 06/21/2024, 10:40 AM

## 2024-06-22 DIAGNOSIS — K276 Chronic or unspecified peptic ulcer, site unspecified, with both hemorrhage and perforation: Secondary | ICD-10-CM | POA: Diagnosis not present

## 2024-06-22 LAB — COMPREHENSIVE METABOLIC PANEL WITH GFR
ALT: 22 U/L (ref 0–44)
AST: 35 U/L (ref 15–41)
Albumin: <1.5 g/dL — ABNORMAL LOW (ref 3.5–5.0)
Alkaline Phosphatase: 42 U/L (ref 38–126)
Anion gap: 5 (ref 5–15)
BUN: 26 mg/dL — ABNORMAL HIGH (ref 8–23)
CO2: 27 mmol/L (ref 22–32)
Calcium: 8 mg/dL — ABNORMAL LOW (ref 8.9–10.3)
Chloride: 102 mmol/L (ref 98–111)
Creatinine, Ser: 1.02 mg/dL — ABNORMAL HIGH (ref 0.44–1.00)
GFR, Estimated: 59 mL/min — ABNORMAL LOW (ref >60)
Glucose, Bld: 84 mg/dL (ref 70–99)
Potassium: 4.3 mmol/L (ref 3.5–5.1)
Sodium: 134 mmol/L — ABNORMAL LOW (ref 135–145)
Total Bilirubin: 0.6 mg/dL (ref 0.0–1.2)
Total Protein: 4.5 g/dL — ABNORMAL LOW (ref 6.5–8.1)

## 2024-06-22 LAB — CBC
HCT: 25.7 % — ABNORMAL LOW (ref 36.0–46.0)
Hemoglobin: 8.1 g/dL — ABNORMAL LOW (ref 12.0–15.0)
MCH: 32.1 pg (ref 26.0–34.0)
MCHC: 31.5 g/dL (ref 30.0–36.0)
MCV: 102 fL — ABNORMAL HIGH (ref 80.0–100.0)
Platelets: UNDETERMINED 10*3/uL (ref 150–400)
RBC: 2.52 MIL/uL — ABNORMAL LOW (ref 3.87–5.11)
RDW: 16.1 % — ABNORMAL HIGH (ref 11.5–15.5)
WBC: 6.2 10*3/uL (ref 4.0–10.5)
nRBC: 0 % (ref 0.0–0.2)

## 2024-06-22 LAB — TRIGLYCERIDES: Triglycerides: 116 mg/dL (ref <150)

## 2024-06-22 LAB — PHOSPHORUS: Phosphorus: 3.1 mg/dL (ref 2.5–4.6)

## 2024-06-22 LAB — MAGNESIUM: Magnesium: 1.9 mg/dL (ref 1.7–2.4)

## 2024-06-22 MED ORDER — ADULT MULTIVITAMIN W/MINERALS CH
1.0000 | ORAL_TABLET | Freq: Every day | ORAL | Status: DC
  Administered 2024-06-22 – 2024-07-11 (×18): 1 via ORAL
  Filled 2024-06-22 (×18): qty 1

## 2024-06-22 MED ORDER — PANTOPRAZOLE SODIUM 40 MG PO TBEC
40.0000 mg | DELAYED_RELEASE_TABLET | Freq: Every day | ORAL | Status: DC
  Administered 2024-06-23: 40 mg via ORAL
  Filled 2024-06-22: qty 1

## 2024-06-22 NOTE — Progress Notes (Signed)
 Nutrition Brief Note  Patient tolerating full liquid diet and intake improving. Accepting Ensure Plus High Protein offerings. Juven continues. No N/V reported over the weekend. Ostomy output stable/desirable ( x24 hours). Advanced to soft diet today and TPN being discontinued. Will monitor intake and tolerance of upgraded diet. Surgery signed off.   6/20: s/p L femoral nail 6/23: perforated bleeding duodenal ulcer; s/p exlap with graham patch repair, 1 prbc transfused 6/25: 1 prbc transfused 6/26: NGT removed, UGI: no leak; txr out of ICU, TPN initiated, clear liquid diet initiated 6/27: advanced to full liquid diet 6/30: advanced to GI soft diet; TPN discontinued  INTERVENTION:  Monitor diet advancement and tolerance, recommend DYS3 diet when advanced for ease of consumption Continue Ensure Plus High Protein po daily, each supplement provides 350 kcal and 20 grams of protein now that diet advanced to full liquid Continue Juven BID to support wound healing Add MVI w/ minerals     NUTRITION DIAGNOSIS:  Increased nutrient needs related to acute illness, wound healing as evidenced by estimated needs. - remains applicable   GOAL:  Patient will meet greater than or equal to 90% of their needs - progressing   MONITOR:  Diet advancement, Labs, Weight trends, I & O's, Skin  Estimated Nutritional Needs:  Kcal:  1600-1800 Protein:  85-100g Fluid:  >/=1.6L  Christina Deaner MS, RD, LDN Registered Dietitian Clinical Nutrition RD Inpatient Contact Info in Amion

## 2024-06-22 NOTE — Progress Notes (Signed)
 7 Days Post-Op   Subjective/Chief Complaint: No complaints.  Tolerating full liquids.   Objective: Vital signs in last 24 hours: Temp:  [97.9 F (36.6 C)-98.6 F (37 C)] 98.5 F (36.9 C) (06/30 0845) Pulse Rate:  [55-82] 59 (06/30 0845) Resp:  [17-23] 19 (06/30 0845) BP: (111-126)/(42-60) 122/60 (06/30 0845) SpO2:  [99 %-100 %] 99 % (06/30 0845) Weight:  [880.7 kg] 119.2 kg (06/30 0644) Last BM Date : 06/20/24  Intake/Output from previous day: 06/29 0701 - 06/30 0700 In: 1220.5 [I.V.:1220.5] Out: 4040 [Urine:3550; Drains:140; Stool:350] Intake/Output this shift: No intake/output data recorded.  General appearance: alert and cooperative Resp: clear to auscultation bilaterally Cardio: regular rate and rhythm GI: Soft and nontender.  Drain output serosanguineous  Lab Results:  Recent Labs    06/21/24 0420 06/22/24 0459  WBC 6.5 6.2  HGB 8.1* 8.1*  HCT 26.0* 25.7*  PLT 139* PLATELET CLUMPS NOTED ON SMEAR, UNABLE TO ESTIMATE   BMET Recent Labs    06/21/24 0420 06/22/24 0459  NA 137 134*  K 4.6 4.3  CL 102 102  CO2 26 27  GLUCOSE 88 84  BUN 26* 26*  CREATININE 1.03* 1.02*  CALCIUM  7.9* 8.0*   PT/INR No results for input(s): LABPROT, INR in the last 72 hours. ABG No results for input(s): PHART, HCO3 in the last 72 hours.  Invalid input(s): PCO2, PO2  Studies/Results: No results found.  Anti-infectives: Anti-infectives (From admission, onward)    Start     Dose/Rate Route Frequency Ordered Stop   06/16/24 1800  vancomycin  (VANCOCIN ) IVPB 1000 mg/200 mL premix  Status:  Discontinued        1,000 mg 200 mL/hr over 60 Minutes Intravenous Every 24 hours 06/15/24 1317 06/15/24 1328   06/15/24 1700  vancomycin  (VANCOREADY) IVPB 2000 mg/400 mL  Status:  Discontinued        2,000 mg 200 mL/hr over 120 Minutes Intravenous  Once 06/15/24 1317 06/15/24 1328   06/15/24 1600  fluconazole  (DIFLUCAN ) IVPB 400 mg  Status:  Discontinued        400  mg 100 mL/hr over 120 Minutes Intravenous Every 24 hours 06/15/24 1317 06/16/24 0914   06/15/24 1500  metroNIDAZOLE  (FLAGYL ) IVPB 500 mg  Status:  Discontinued        500 mg 100 mL/hr over 60 Minutes Intravenous Every 12 hours 06/15/24 1216 06/15/24 1255   06/15/24 1400  ciprofloxacin  (CIPRO ) IVPB 400 mg  Status:  Discontinued        400 mg 200 mL/hr over 60 Minutes Intravenous Every 12 hours 06/15/24 1216 06/15/24 1255   06/15/24 1400  piperacillin -tazobactam (ZOSYN ) IVPB 3.375 g        3.375 g 12.5 mL/hr over 240 Minutes Intravenous Every 8 hours 06/15/24 1317 06/20/24 2359   06/15/24 1400  vancomycin  (VANCOCIN ) IVPB 1000 mg/200 mL premix  Status:  Discontinued        1,000 mg 200 mL/hr over 60 Minutes Intravenous Every 24 hours 06/15/24 1328 06/16/24 0914   06/15/24 1333  vancomycin  (VANCOCIN ) 1-5 GM/200ML-% IVPB       Note to Pharmacy: Romona Shaver E: cabinet override      06/15/24 1333 06/15/24 1410   06/15/24 1221  ciprofloxacin  (CIPRO ) 400 MG/200ML IVPB  Status:  Discontinued       Note to Pharmacy: Dannielle Railing S: cabinet override      06/15/24 1221 06/15/24 1343   06/15/24 1221  metroNIDAZOLE  (FLAGYL ) 500 MG/100ML IVPB  Status:  Discontinued  Note to Pharmacy: Dannielle Railing S: cabinet override      06/15/24 1221 06/15/24 1343   06/12/24 1223  clindamycin  (CLEOCIN ) 900 MG/50ML IVPB       Note to Pharmacy: Elaine Nest L: cabinet override      06/12/24 1223 06/12/24 1332   06/12/24 1131  ceFAZolin  (ANCEF ) 2-4 GM/100ML-% IVPB  Status:  Discontinued       Note to Pharmacy: Billy Sluder H: cabinet override      06/12/24 1131 06/12/24 1434   06/11/24 1300  clindamycin  (CLEOCIN ) IVPB 900 mg  Status:  Discontinued        900 mg 100 mL/hr over 30 Minutes Intravenous On call to O.R. 06/10/24 1041 06/10/24 1447   06/11/24 1300  ceFAZolin  (ANCEF ) IVPB 2g/100 mL premix        2 g 200 mL/hr over 30 Minutes Intravenous On call to O.R. 06/10/24 1447 06/12/24 1908        Assessment/Plan: s/p Procedure(s): LAPAROTOMY, EXPLORATORY OMENTAL GRAHAM PATCH REPAIR. (N/A) LAPAROTOMY, FOR LYSIS OF ADHESIONS (N/A) Advance diet Tolerating diet with normal drain output. Stable from surgery standpoint with no sign of leak.  Diet can be advanced and she can follow-up with her surgeon at [facility].  Will sign off  LOS: 15 days    Christina Castaneda 06/22/2024

## 2024-06-22 NOTE — Progress Notes (Signed)
 PROGRESS NOTE    Christina Castaneda  FMW:[id number] DOB: 01-Nov-1956 DOA: 06/07/2024 PCP: [name], DO   Chief Complaint  Patient presents with   Fall    Brief Narrative:    68 yo F PMH perf sigmoid colon s/p hartmann, colostomy revision, HTN who was admitted to [facility] at [facility] 6/15 after presenting for a fall. Found to have a L distal femur fx related to the fall. On admission,  Labs significant for AKI w hyperK. She was medically optimized and then underwent retrograde L femoral nail.  On 6/23 overnight she had a drop in hgb to 6.2 -- associated dark ostomy output and increasing abdominal pain. Transfused. Subsequent hypotension requiring pressor initiation. Went for CTA which was concerning for perforated bleeding ulcer with air bubbles adjacent to duodenal bulb and layering of high attenuation material c/f active extrav.  CCS was consulted and pt went to OR for ex lap, on NE and vaso, having received 3 PRBC 1 plt   PCCM is consulted pre-op for transfer to [place] post operatively    Significant Hospital Events: Including procedures, antibiotic start and stop dates in addition to other pertinent events   6/15 admit to TRH after fall days earlier, L femur fx and AKI 6/20 OR w ortho 6/23 hgb drop, shock -- found to have perforated bleeding duodenal ulcer  6/24 extubated 6/25 off pressors, txf out of ICU  6/25 1 unit PRBC  Assessment & Plan:   Principal Problem:   Peptic ulcer perforation and hemorrhage (HCC) Active Problems:   AKI (acute kidney injury) (HCC)   Leukocytosis   Hypothyroid   COPD  GOLD 2/AB   OSA (obstructive sleep apnea)   Obesity   Tobacco abuse   Essential hypertension   Acute blood loss anemia   Generalized weakness   Chronic pain syndrome   Morbid obesity due to excess calories (HCC)   S/P colostomy (HCC)   Chronic venous insufficiency   Cigarette smoker   Sepsis (HCC)   Femur fracture, left (HCC)   Hemorrhagic shock (HCC)   Thrombocytopenia  (HCC)   Duodenal ulcer   Intra-abdominal adhesions   Severe sepsis (improved shock) 2/2 intraabdominal infection r/t below  Pneumoperitoneum Perforated bleeding duodenal ulcer s/p ex lap, graham patch repair, LOA (6/23 at [facility])  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  . - NGT discontinued  -upper GI single barium study 6/26 with no evidence of leak, started on clear liquid, advance to full liquid diet today. - Continue with zosyn  x 5 days postoperatively - Will DC TPN today -h pylori testing ordered by CCS, still pending, discussed with staff to send. - IV Protonix  40 mg twice daily. -Cleared to resume DVT prophylaxis per general surgery, currently on subcu heparin , monitor CBC closely and transfuse as needed. - Diet per general surgery, improving, will advance to soft diet today.  Chronic hypoxic respiratory failure  COPD OSA on CPAP  Tobacco use  -pulm hygiene she was encouraged with incentive spirometer. -O2 for goal > 88  - -at baseline she uses O2 at night    Anemia -- ABLA + critical illness /hemorrhagic shock Thrombocytopenia, mild  - Acute blood loss anemia from perforated ulcer,. - Transfuse 1 unit PRBC yesterday, with good response, hemoglobin is 8.3, continue to monitor closely and transfuse as needed . -if she were to bleed, would need IR    Fall L femur fx s/p retrograde L femoral  nail on 6/20 -appreciate ortho recs 6/21 -PT when appropriate   AKI, improving Urinary retention  -follow renal indices, UOP  -optimize lytes  -cont foley for now -- wont be able to give retention meds as she is NPO    Mild hypoglycemia -incr d5LR to 150/hr and cont CBG monitoring    Hypothyroidism - Will resume Synthroid  now she can take oral.     HLD HTN -statin on hold while npo -holding antihypertensives, resume (IV as she is NPO)    Stage 3 sacral pressure injury POA -woc consult   Obesity class II Body mass index  is 43.73 kg/m.  Severe protein calorie malnutrition - With albumin level> 1.5, patient input appreciated, continue with TPN, hopefully can discontinue in 48 hours when she is on soft diet with improved oral intake.     DVT prophylaxis: SCD, back on subcu heparin  6/28. Code Status: Full code Family Communication: None at bedside  Status is: Inpatient    Consultants:  PCCM Surgery Orthopedic Gastrointestinal  Subjective:  Tolerating full liquid diet, no nausea, no vomiting  Objective: Vitals:   06/22/24 0400 06/22/24 0610 06/22/24 0644 06/22/24 0845  BP: (!) 111/42 (!) 118/53  122/60  Pulse: (!) 59 (!) 55  (!) 59  Resp: 17 19  19   Temp: 98 F (36.7 C)   98.5 F (36.9 C)  TempSrc: Oral   Oral  SpO2: 100% 100%  99%  Weight:   119.2 kg   Height:        Intake/Output Summary (Last 24 hours) at 06/22/2024 1242 Last data filed at 06/22/2024 9380 Gross per 24 hour  Intake 1210.5 ml  Output 2690 ml  Net -1479.5 ml   Filed Weights   06/20/24 0500 06/21/24 0505 06/22/24 0644  Weight: 121.1 kg 125.5 kg 119.2 kg    Examination:  Awake Alert, Oriented X 3, significantly deconditioned Symmetrical Chest wall movement, Good air movement bilaterally, CTAB RRR,No Gallops,Rubs or new Murmurs, No Parasternal Heave +ve B.Sounds, colostomy present, surgical wound bandaged, sanguinous output in JP drain  no Cyanosis, anasarca, left lower extremity with knee immobilizer    Data Reviewed: I have personally reviewed following labs and imaging studies  CBC: Recent Labs  Lab 06/18/24 0031 06/19/24 0405 06/20/24 0428 06/21/24 0420 06/22/24 0459  WBC 10.3 7.8 7.1 6.5 6.2  HGB 8.3* 8.0* 8.2* 8.1* 8.1*  HCT 25.9* 25.3* 26.3* 26.0* 25.7*  MCV 97.7 100.8* 101.2* 101.6* 102.0*  PLT 133* 103* PLATELET CLUMPS NOTED ON SMEAR, UNABLE TO ESTIMATE 139* PLATELET CLUMPS NOTED ON SMEAR, UNABLE TO ESTIMATE    Basic Metabolic Panel: Recent Labs  Lab 06/16/24 0415 06/17/24 0425  06/18/24 0031 06/19/24 0405 06/20/24 0428 06/21/24 0420 06/22/24 0459  NA 134*   < > 142 144 141 137 134*  K 4.6   < > 3.7 4.1 4.4 4.6 4.3  CL 107   < > 110 110 108 102 102  CO2 22   < > 25 28 27 26 27   GLUCOSE 118*   < > 81 85 94 88 84  BUN 84*   < > 44* 26* 20 26* 26*  CREATININE 1.56*   < > 1.30* 1.14* 1.08* 1.03* 1.02*  CALCIUM  7.5*   < > 7.7* 7.9* 7.7* 7.9* 8.0*  MG 1.9  --   --  1.7 2.1 1.9 1.9  PHOS  --   --   --  2.4* 2.9 3.1 3.1   < > = values in this interval  not displayed.    GFR: Estimated Creatinine Clearance: 66.4 mL/min (A) (by C-G formula based on SCr of 1.02 mg/dL (H)).  Liver Function Tests: Recent Labs  Lab 06/16/24 0415 06/17/24 0425 06/18/24 0031 06/19/24 0405 06/22/24 0459  AST 70* 39 52* 64* 35  ALT 36 23 26 29 22   ALKPHOS 41 33* 38 40 42  BILITOT 0.9 0.8 0.7 0.6 0.6  PROT 3.9* 3.7* 4.2* 4.0* 4.5*  ALBUMIN <1.5* <1.5* <1.5* <1.5* <1.5*    CBG: Recent Labs  Lab 06/18/24 2314 06/19/24 0331 06/19/24 0822 06/19/24 1232 06/19/24 1702  GLUCAP 85 88 87 104* 94     Recent Results (from the past 240 hours)  MRSA Next Gen by PCR, Nasal     Status: None   Collection Time: 06/15/24  8:01 AM   Specimen: Nasal Mucosa; Nasal Swab  Result Value Ref Range Status   MRSA by PCR Next Gen NOT DETECTED NOT DETECTED Final    Comment: (NOTE) The GeneXpert MRSA Assay (FDA approved for NASAL specimens only), is one component of a comprehensive MRSA colonization surveillance program. It is not intended to diagnose MRSA infection nor to guide or monitor treatment for MRSA infections. Test performance is not FDA approved in patients less than 33 years old. Performed at [facility], [address], [place], [place] [address]   Aerobic/Anaerobic Culture w Gram Stain (surgical/deep wound)     Status: None   Collection Time: 06/15/24  3:24 PM   Specimen: Path fluid; Body Fluid  Result Value Ref Range Status   Specimen Description   Final     PERITONEAL Performed at [facility], [address] [place], [place] [address]    Special Requests   Final    NONE Performed at [facility], [address] [place], [place] [address]    Gram Stain   Final    RARE WBC PRESENT, PREDOMINANTLY PMN NO ORGANISMS SEEN    Culture   Final    ABUNDANT CANDIDA ALBICANS MODERATE CANDIDA GLABRATA NO ANAEROBES ISOLATED Performed at [facility], [address], [place], [place] [address]    Report Status 06/20/2024 FINAL  Final  Culture, blood (Routine X 2) w Reflex to ID Panel     Status: None   Collection Time: 06/15/24  5:13 PM   Specimen: BLOOD  Result Value Ref Range Status   Specimen Description BLOOD LEFT ANTECUBITAL  Final   Special Requests   Final    BOTTLES DRAWN AEROBIC AND ANAEROBIC Blood Culture adequate volume   Culture   Final    NO GROWTH 5 DAYS Performed at [facility], [address] [place], [place] [address]    Report Status 06/20/2024 FINAL  Final  Culture, blood (Routine X 2) w Reflex to ID Panel     Status: None   Collection Time: 06/15/24  5:24 PM   Specimen: BLOOD  Result Value Ref Range Status   Specimen Description BLOOD LEFT ANTECUBITAL  Final   Special Requests   Final    BOTTLES DRAWN AEROBIC AND ANAEROBIC Blood Culture results may not be optimal due to an inadequate volume of blood received in culture bottles   Culture   Final    NO GROWTH 5 DAYS Performed at [facility], [address], [place], [place] [address]    Report Status 06/20/2024 FINAL  Final         Radiology Studies: No results found.       Scheduled Meds:  arformoterol   15 mcg Nebulization Q12H   budesonide  (PULMICORT ) nebulizer solution  0.5 mg Nebulization BID   Chlorhexidine  Gluconate Cloth  6 each Topical Daily   feeding supplement  237 mL Oral BID BM   heparin  injection (subcutaneous)  5,000 Units Subcutaneous Q8H   leptospermum manuka honey  1 Application Topical Daily   levothyroxine   150 mcg Oral Q0600    nutrition supplement (JUVEN)  1 packet Oral BID AC & HS   nystatin    Topical BID   [START ON 06/23/2024] pantoprazole   40 mg Oral Daily   sodium chloride  flush  10-40 mL Intracatheter Q12H   sodium chloride  flush  3 mL Intravenous Q12H   Continuous Infusions:  TPN ADULT (ION) 70 mL/hr at 06/21/24 1727     LOS: 15 days      Brayton Lye, MD [facility]   To contact the attending provider between 7A-7P or the covering provider during after hours 7P-7A, please log into the web site [email or web address] and access using universal  password for that web site. If you do not have the password, please call the hospital operator.  06/22/2024, 12:42 PM

## 2024-06-22 NOTE — Progress Notes (Signed)
 PHARMACY - TOTAL PARENTERAL NUTRITION CONSULT NOTE  Indication: perforated ulcer unable to tolerate oral intake  Patient Measurements: Height: 5' 5 (165.1 cm) Weight: 119.2 kg (262 lb 12.6 oz) IBW/kg (Calculated) : 57 TPN AdjBW (KG): 69 Body mass index is 43.73 kg/m.  Assessment:  54 YOF s/p ex-lap, omental Graham patch repair of the duodenal ulcer, lysis of adhesions. She has a perforated ulcer unable to tolerate oral intake.  Pharmacy consulted to dose TPN.  Glucose / Insulin : no hx DM - CBGs < 180.  SSI/CBG checks D/C'ed 06/19/24. Electrolytes: Na: 134, K: 4.3, mag: 1.9, all others WNL  Renal: SCr stable ~1.0, BUN 26 Hepatic: LFTs WNL, tbili / TG WNL, albumin < 1.5 Intake / Output; MIVF: UOP 1.2 ml/kg/hr, drains , colostomy GI Imaging: none since TPN initiation  GI Surgeries / Procedures: none since TPN initiation  Central access: PICC placed 06/18/24 TPN start date: 06/18/24  Nutritional Goals: Goal TPN rate is 70 mL/hr (provides 92g AA and 1619 kCal per day)  RD Estimated Needs Total Energy Estimated Needs: 1600-1800 Total Protein Estimated Needs: 85-100g Total Fluid Estimated Needs: >/=1.6L  Current Nutrition:  TPN Diet soft  Ensure Plus High Protein BID - 1 charted given yesterday Juven BID - 2 charted given yesterday  Plan:  Wean TPN to off today - will continue at 70cc/hr and wean to 35cc/hr at 16:00 then to off at 18:00.  Will d/c TPN labs and orders.   Powell Blush, PharmD, BCCCP  06/22/2024, 7:10 AM

## 2024-06-22 NOTE — Discharge Instructions (Signed)
 Remove midline staples on June 29, 2024 at the nursing facility.   OPEN ABDOMINAL SURGERY: POST OP INSTRUCTIONS  Always review your discharge instruction sheet given to you by the facility where your surgery was performed.   A prescription for pain medication may be given to you upon discharge.  Take your pain medication as prescribed, if needed.  If narcotic pain medicine is not needed, then you may take acetaminophen  (Tylenol ) or ibuprofen (Advil) as needed. Take your usually prescribed medications unless otherwise directed. If you need a refill on your pain medication, please contact your pharmacy. They will contact our office to request authorization.  Prescriptions will not be filled after 5pm or on week-ends. You should follow a light diet the first few days after arrival home, such as soup and crackers, pudding, etc.unless your doctor has advised otherwise. A high-fiber, low fat diet can be resumed as tolerated.   Be sure to include lots of fluids daily. Most patients will experience some swelling and bruising on the chest and neck area.  Ice packs will help.  Swelling and bruising can take several days to resolve Most patients will experience some swelling and bruising in the area of the incision. Ice pack will help. Swelling and bruising can take several days to resolve..  It is common to experience some constipation if taking pain medication after surgery.  Increasing fluid intake and taking a stool softener will usually help or prevent this problem from occurring.  A mild laxative (Milk of Magnesia or Miralax ) should be taken according to package directions if there are no bowel movements after 48 hours.  You may have steri-strips (small skin tapes) in place directly over the incision.  These strips should be left on the skin for 7-10 days.  If your surgeon used skin glue on the incision, you may shower in 24 hours.  The glue will flake off over the next 2-3 weeks.  Any sutures or staples  will be removed at the office during your follow-up visit. You may find that a light gauze bandage over your incision may keep your staples from being rubbed or pulled. You may shower and replace the bandage daily. ACTIVITIES:  You may resume regular (light) daily activities beginning the next day--such as daily self-care, walking, climbing stairs--gradually increasing activities as tolerated.  You may have sexual intercourse when it is comfortable.  Refrain from any heavy lifting or straining until approved by your doctor. You may drive when you no longer are taking prescription pain medication, you can comfortably wear a seatbelt, and you can safely maneuver your car and apply brakes Return to Work: ___________________________________ [name] should see your doctor in the office for a follow-up appointment approximately two weeks after your surgery.  Make sure that you call for this appointment within a day or two after you arrive home to insure a convenient appointment time. OTHER INSTRUCTIONS:  _____________________________________________________________ _____________________________________________________________  WHEN TO CALL YOUR DOCTOR: Fever over 101.0 Inability to urinate Nausea and/or vomiting Extreme swelling or bruising Continued bleeding from incision. Increased pain, redness, or drainage from the incision. Difficulty swallowing or breathing Muscle cramping or spasms. Numbness or tingling in hands or feet or around lips.

## 2024-06-22 NOTE — Progress Notes (Signed)
 PT Cancellation Note  Patient Details Name: Christina Castaneda MRN: [id number] DOB: 03/16/1956   Cancelled Treatment:    Reason Eval/Treat Not Completed: Patient declined, no reason specified (Pt refused stating that she just got a bath and would like to get her nerves down first. Pt initially requested PT to come back much later in the evening however when informed that PT would be off pt requested PT return in the morning.)   Zendaya Groseclose 06/22/2024, 2:09 PM

## 2024-06-22 NOTE — Plan of Care (Signed)
°  Problem: Health Behavior/Discharge Planning: °Goal: Ability to manage health-related needs will improve °Outcome: Progressing °  °Problem: Clinical Measurements: °Goal: Ability to maintain clinical measurements within normal limits will improve °Outcome: Progressing °  °Problem: Activity: °Goal: Risk for activity intolerance will decrease °Outcome: Progressing °  °Problem: Nutrition: °Goal: Adequate nutrition will be maintained °Outcome: Progressing °  °Problem: Coping: °Goal: Level of anxiety will decrease °Outcome: Progressing °  °Problem: Elimination: °Goal: Will not experience complications related to bowel motility °Outcome: Progressing °  °

## 2024-06-22 NOTE — TOC Progression Note (Signed)
 Transition of Care [facility]) - Progression Note    Patient Details  Name: Christina Castaneda MRN: [id number] Date of Birth: Aug 31, 1956  Transition of Care [facility] CM/SW Contact  Inocente GORMAN Kindle, LCSW Phone Number: 06/22/2024, 12:43 PM  Clinical Narrative:    CSW provided update to [facility].    Expected Discharge Plan: Skilled Nursing Facility Barriers to Discharge: Continued Medical Work up, Other (must enter comment) (SNF auth pending)  Expected Discharge Plan and Services In-house Referral: Clinical Social Work Discharge Planning Services: CM Consult Post Acute Care Choice: Skilled Nursing Facility Living arrangements for the past 2 months: Single Family Home                                       Social Determinants of Health (SDOH) Interventions SDOH Screenings   Food Insecurity: No Food Insecurity (06/08/2024)  Housing: Low Risk  (06/08/2024)  Transportation Needs: No Transportation Needs (06/08/2024)  Utilities: Not At Risk (06/08/2024)  Social Connections: Unknown (06/08/2024)  Recent Concern: Social Connections - Moderately Isolated (05/27/2024)  Tobacco Use: High Risk (06/16/2024)    Readmission Risk Interventions    05/27/2024   10:01 PM  Readmission Risk Prevention Plan  Post Dischage Appt Complete  Medication Screening Complete  Transportation Screening Complete

## 2024-06-23 DIAGNOSIS — K276 Chronic or unspecified peptic ulcer, site unspecified, with both hemorrhage and perforation: Secondary | ICD-10-CM | POA: Diagnosis not present

## 2024-06-23 LAB — CBC
HCT: 27.8 % — ABNORMAL LOW (ref 36.0–46.0)
Hemoglobin: 8.7 g/dL — ABNORMAL LOW (ref 12.0–15.0)
MCH: 31.4 pg (ref 26.0–34.0)
MCHC: 31.3 g/dL (ref 30.0–36.0)
MCV: 100.4 fL — ABNORMAL HIGH (ref 80.0–100.0)
Platelets: 112 10*3/uL — ABNORMAL LOW (ref 150–400)
RBC: 2.77 MIL/uL — ABNORMAL LOW (ref 3.87–5.11)
RDW: 16.2 % — ABNORMAL HIGH (ref 11.5–15.5)
WBC: 7.1 10*3/uL (ref 4.0–10.5)
nRBC: 0 % (ref 0.0–0.2)

## 2024-06-23 LAB — BASIC METABOLIC PANEL WITH GFR
Anion gap: 5 (ref 5–15)
BUN: 32 mg/dL — ABNORMAL HIGH (ref 8–23)
CO2: 26 mmol/L (ref 22–32)
Calcium: 8.1 mg/dL — ABNORMAL LOW (ref 8.9–10.3)
Chloride: 101 mmol/L (ref 98–111)
Creatinine, Ser: 0.95 mg/dL (ref 0.44–1.00)
GFR, Estimated: >60 mL/min (ref >60)
Glucose, Bld: 83 mg/dL (ref 70–99)
Potassium: 4.2 mmol/L (ref 3.5–5.1)
Sodium: 132 mmol/L — ABNORMAL LOW (ref 135–145)

## 2024-06-23 LAB — H. PYLORI ANTIGEN, STOOL: H. Pylori Stool Ag, Eia: NEGATIVE

## 2024-06-23 MED ORDER — PANTOPRAZOLE SODIUM 40 MG PO TBEC
40.0000 mg | DELAYED_RELEASE_TABLET | Freq: Two times a day (BID) | ORAL | Status: DC
  Administered 2024-06-23 – 2024-06-24 (×3): 40 mg via ORAL
  Filled 2024-06-23 (×3): qty 1

## 2024-06-23 MED ORDER — OXYCODONE HCL 5 MG PO TABS
5.0000 mg | ORAL_TABLET | ORAL | Status: DC | PRN
  Administered 2024-06-23 – 2024-07-11 (×40): 5 mg via ORAL
  Filled 2024-06-23 (×40): qty 1

## 2024-06-23 MED ORDER — DAKINS (1/4 STRENGTH) 0.125 % EX SOLN
Freq: Two times a day (BID) | CUTANEOUS | Status: DC
  Administered 2024-06-23: 1
  Filled 2024-06-23: qty 473

## 2024-06-23 MED ORDER — OXYCODONE HCL 5 MG PO TABS
5.0000 mg | ORAL_TABLET | Freq: Once | ORAL | Status: AC
  Administered 2024-06-23: 5 mg via ORAL
  Filled 2024-06-23: qty 1

## 2024-06-23 MED ORDER — OXYCODONE HCL 5 MG PO TABS
5.0000 mg | ORAL_TABLET | Freq: Four times a day (QID) | ORAL | Status: DC | PRN
  Administered 2024-06-23: 5 mg via ORAL
  Filled 2024-06-23: qty 1

## 2024-06-23 MED ORDER — PREDNISONE 20 MG PO TABS
40.0000 mg | ORAL_TABLET | Freq: Every day | ORAL | Status: DC
  Administered 2024-06-23 – 2024-06-24 (×2): 40 mg via ORAL
  Filled 2024-06-23 (×2): qty 2

## 2024-06-23 MED ORDER — MIDODRINE HCL 5 MG PO TABS
10.0000 mg | ORAL_TABLET | ORAL | Status: AC
  Administered 2024-06-23: 10 mg via ORAL
  Filled 2024-06-23: qty 2

## 2024-06-23 NOTE — TOC Progression Note (Signed)
 Transition of Care [facility]) - Progression Note    Patient Details  Name: TIFFINE HENIGAN MRN: [id number] Date of Birth: April 09, 1956  Transition of Care [facility]) CM/SW Contact  Jeoffrey LITTIE Moose, LCSW Phone Number: 06/23/2024, 1:50 PM  Clinical Narrative:    Therapist, occupational received for [place]. Auth number [id number] effective 7/2-06/26/24   Expected Discharge Plan: Skilled Nursing Facility Barriers to Discharge: Continued Medical Work up, Other (must enter comment) (SNF auth pending)  Expected Discharge Plan and Services In-house Referral: Clinical Social Work Discharge Planning Services: CM Consult Post Acute Care Choice: Skilled Nursing Facility Living arrangements for the past 2 months: Single Family Home                                       Social Determinants of Health (SDOH) Interventions SDOH Screenings   Food Insecurity: No Food Insecurity (06/08/2024)  Housing: Low Risk  (06/08/2024)  Transportation Needs: No Transportation Needs (06/08/2024)  Utilities: Not At Risk (06/08/2024)  Social Connections: Unknown (06/08/2024)  Recent Concern: Social Connections - Moderately Isolated (05/27/2024)  Tobacco Use: High Risk (06/16/2024)    Readmission Risk Interventions    05/27/2024   10:01 PM  Readmission Risk Prevention Plan  Post Dischage Appt Complete  Medication Screening Complete  Transportation Screening Complete

## 2024-06-23 NOTE — Progress Notes (Signed)
 PROGRESS NOTE    Christina Castaneda  FMW:[id number] DOB: July 20, 1956 DOA: 06/07/2024 PCP: [name], DO   Chief Complaint  Patient presents with   Fall    Brief Narrative:    68 yo F PMH perf sigmoid colon s/p hartmann, colostomy revision, HTN who was admitted to TRH at [facility] 6/15 after presenting for a fall. Found to have a L distal femur fx related to the fall. On admission,  Labs significant for AKI w hyperK. She was medically optimized and then underwent retrograde L femoral nail.  On 6/23 overnight she had a drop in hgb to 6.2 -- associated dark ostomy output and increasing abdominal pain. Transfused. Subsequent hypotension requiring pressor initiation. Went for CTA which was concerning for perforated bleeding ulcer with air bubbles adjacent to duodenal bulb and layering of high attenuation material c/f active extrav.  CCS was consulted and pt went to OR for ex lap, on NE and vaso, having received 3 PRBC 1 plt   PCCM is consulted pre-op for transfer to [place] post operatively    Significant Hospital Events: Including procedures, antibiotic start and stop dates in addition to other pertinent events   6/15 admit to TRH after fall days earlier, L femur fx and AKI 6/20 OR w ortho 6/23 hgb drop, shock -- found to have perforated bleeding duodenal ulcer  6/24 extubated 6/25 off pressors, txf out of ICU  6/25 1 unit PRBC  Assessment & Plan:   Principal Problem:   Peptic ulcer perforation and hemorrhage (HCC) Active Problems:   AKI (acute kidney injury) (HCC)   Leukocytosis   Hypothyroid   COPD  GOLD 2/AB   OSA (obstructive sleep apnea)   Obesity   Tobacco abuse   Essential hypertension   Acute blood loss anemia   Generalized weakness   Chronic pain syndrome   Morbid obesity due to excess calories (HCC)   S/P colostomy (HCC)   Chronic venous insufficiency   Cigarette smoker   Sepsis (HCC)   Femur fracture, left (HCC)   Hemorrhagic shock (HCC)   Thrombocytopenia  (HCC)   Duodenal ulcer   Intra-abdominal adhesions   Severe sepsis (improved shock) 2/2 intraabdominal infection r/t below  Pneumoperitoneum Perforated bleeding duodenal ulcer s/p ex lap, graham patch repair, LOA (6/23 at [facility])  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 --post op care per CCS, NGT discontinued, diet advanced gradually, currently off TPN, tolerating soft diet . - Finished zosyn  x 5 days postoperatively -Stool H. pylori antigen is negative - Will transition from IV to p.o. pain medicine in anticipation of discharge in 1 to 2 days.  Chronic hypoxic respiratory failure  COPD OSA on CPAP  Tobacco use  -pulm hygiene she was encouraged with incentive spirometer. -O2 for goal > 88  - -at baseline she uses O2 at night  -She is with significant wheezing,  will give prednisone x 3 days.   Anemia -- ABLA + critical illness /hemorrhagic shock Thrombocytopenia, mild  - Acute blood loss anemia from perforated ulcer,. - Transfuse 1 unit PRBC yesterday, with good response, hemoglobin is 8.3, continue to monitor closely and transfuse as needed . -if she were to bleed, would need IR    Fall L femur fx s/p retrograde L femoral nail on 6/20 -appreciate ortho recs 6/21 -PT when appropriate   AKI, improving Urinary retention  -follow renal indices, UOP  -optimize lytes  -cont foley for now -- wont  be able to give retention meds as she is NPO    Mild hypoglycemia -incr d5LR to 150/hr and cont CBG monitoring    Hypothyroidism - Will resume Synthroid  now she can take oral.     HLD HTN -statin on hold while npo -holding antihypertensives, resume (IV as she is NPO)    Stage 3 sacral pressure injury POA -woc consult   Obesity class II Body mass index is 43.4 kg/m.  Severe protein calorie malnutrition - With albumin level> 1.5, patient input appreciated, continue with TPN, hopefully can discontinue in 48 hours when she is  on soft diet with improved oral intake.     DVT prophylaxis: SCD, back on subcu heparin  6/28. Code Status: Full code Family Communication: None at bedside  Status is: Inpatient    Consultants:  PCCM Surgery Orthopedic Gastrointestinal  Subjective:  Tolerating full diet, reports cough  Objective: Vitals:   06/23/24 0318 06/23/24 0445 06/23/24 0754 06/23/24 0757  BP: 104/69  124/67   Pulse:   75 70  Resp:   19 17  Temp: 98.3 F (36.8 C)  99 F (37.2 C)   TempSrc: Oral  Oral   SpO2: 98%  99% 99%  Weight:  118.3 kg    Height:        Intake/Output Summary (Last 24 hours) at 06/23/2024 1403 Last data filed at 06/23/2024 9166 Gross per 24 hour  Intake 240 ml  Output 2165 ml  Net -1925 ml   Filed Weights   06/21/24 0505 06/22/24 0644 06/23/24 0445  Weight: 125.5 kg 119.2 kg 118.3 kg    Examination:  Awake Alert, Oriented X 3, significantly deconditioned Symmetrical Chest wall movement, Good air movement bilaterally, she has wheezing today RRR,No Gallops,Rubs or new Murmurs, No Parasternal Heave +ve B.Sounds, colostomy present, surgical wound bandaged, sanguinous output in JP drain  no Cyanosis, anasarca, left lower extremity with knee immobilizer    Data Reviewed: I have personally reviewed following labs and imaging studies  CBC: Recent Labs  Lab 06/19/24 0405 06/20/24 0428 06/21/24 0420 06/22/24 0459 06/23/24 0408  WBC 7.8 7.1 6.5 6.2 7.1  HGB 8.0* 8.2* 8.1* 8.1* 8.7*  HCT 25.3* 26.3* 26.0* 25.7* 27.8*  MCV 100.8* 101.2* 101.6* 102.0* 100.4*  PLT 103* PLATELET CLUMPS NOTED ON SMEAR, UNABLE TO ESTIMATE 139* PLATELET CLUMPS NOTED ON SMEAR, UNABLE TO ESTIMATE 112*    Basic Metabolic Panel: Recent Labs  Lab 06/19/24 0405 06/20/24 0428 06/21/24 0420 06/22/24 0459 06/23/24 0408  NA 144 141 137 134* 132*  K 4.1 4.4 4.6 4.3 4.2  CL 110 108 102 102 101  CO2 28 27 26 27 26   GLUCOSE 85 94 88 84 83  BUN 26* 20 26* 26* 32*  CREATININE 1.14* 1.08* 1.03*  1.02* 0.95  CALCIUM  7.9* 7.7* 7.9* 8.0* 8.1*  MG 1.7 2.1 1.9 1.9  --   PHOS 2.4* 2.9 3.1 3.1  --     GFR: Estimated Creatinine Clearance: 70.9 mL/min (by C-G formula based on SCr of 0.95 mg/dL).  Liver Function Tests: Recent Labs  Lab 06/17/24 0425 06/18/24 0031 06/19/24 0405 06/22/24 0459  AST 39 52* 64* 35  ALT 23 26 29 22   ALKPHOS 33* 38 40 42  BILITOT 0.8 0.7 0.6 0.6  PROT 3.7* 4.2* 4.0* 4.5*  ALBUMIN <1.5* <1.5* <1.5* <1.5*    CBG: Recent Labs  Lab 06/18/24 2314 06/19/24 0331 06/19/24 0822 06/19/24 1232 06/19/24 1702  GLUCAP 85 88 87 104* 94  Recent Results (from the past 240 hours)  MRSA Next Gen by PCR, Nasal     Status: None   Collection Time: 06/15/24  8:01 AM   Specimen: Nasal Mucosa; Nasal Swab  Result Value Ref Range Status   MRSA by PCR Next Gen NOT DETECTED NOT DETECTED Final    Comment: (NOTE) The GeneXpert MRSA Assay (FDA approved for NASAL specimens only), is one component of a comprehensive MRSA colonization surveillance program. It is not intended to diagnose MRSA infection nor to guide or monitor treatment for MRSA infections. Test performance is not FDA approved in patients less than 40 years old. Performed at [facility], [address] [place], [place] [address]   Aerobic/Anaerobic Culture w Gram Stain (surgical/deep wound)     Status: None   Collection Time: 06/15/24  3:24 PM   Specimen: Path fluid; Body Fluid  Result Value Ref Range Status   Specimen Description   Final    PERITONEAL Performed at [facility], [address], [place], [place] [address]    Special Requests   Final    NONE Performed at [facility], [address], [place], [place] [address]    Gram Stain   Final    RARE WBC PRESENT, PREDOMINANTLY PMN NO ORGANISMS SEEN    Culture   Final    ABUNDANT CANDIDA ALBICANS MODERATE CANDIDA GLABRATA NO ANAEROBES ISOLATED Performed at [facility], [address] [place], [place] [address]    Report Status  06/20/2024 FINAL  Final  Culture, blood (Routine X 2) w Reflex to ID Panel     Status: None   Collection Time: 06/15/24  5:13 PM   Specimen: BLOOD  Result Value Ref Range Status   Specimen Description BLOOD LEFT ANTECUBITAL  Final   Special Requests   Final    BOTTLES DRAWN AEROBIC AND ANAEROBIC Blood Culture adequate volume   Culture   Final    NO GROWTH 5 DAYS Performed at [facility], [address], [place], [place] [address]    Report Status 06/20/2024 FINAL  Final  Culture, blood (Routine X 2) w Reflex to ID Panel     Status: None   Collection Time: 06/15/24  5:24 PM   Specimen: BLOOD  Result Value Ref Range Status   Specimen Description BLOOD LEFT ANTECUBITAL  Final   Special Requests   Final    BOTTLES DRAWN AEROBIC AND ANAEROBIC Blood Culture results may not be optimal due to an inadequate volume of blood received in culture bottles   Culture   Final    NO GROWTH 5 DAYS Performed at [facility], [address] [place], [place] [address]    Report Status 06/20/2024 FINAL  Final         Radiology Studies: No results found.       Scheduled Meds:  arformoterol   15 mcg Nebulization Q12H   budesonide  (PULMICORT ) nebulizer solution  0.5 mg Nebulization BID   Chlorhexidine  Gluconate Cloth  6 each Topical Daily   feeding supplement  237 mL Oral BID BM   heparin  injection (subcutaneous)  5,000 Units Subcutaneous Q8H   levothyroxine   150 mcg Oral Q0600   multivitamin with minerals  1 tablet Oral Daily   nutrition supplement (JUVEN)  1 packet Oral BID AC & HS   nystatin    Topical BID   pantoprazole   40 mg Oral BID   predniSONE  40 mg Oral Q breakfast   sodium chloride  flush  10-40 mL Intracatheter  Q12H   sodium chloride  flush  3 mL Intravenous Q12H   sodium hypochlorite   Irrigation BID   Continuous Infusions:     LOS: 16 days      Brayton Lye, MD [facility]   To contact the attending provider between 7A-7P or the covering provider during  after hours 7P-7A, please log into the web site [email or web address] and access using universal [place] password for that web site. If you do not have the password, please call the hospital operator.  06/23/2024, 2:03 PM

## 2024-06-23 NOTE — Progress Notes (Signed)
 Physical Therapy Treatment Patient Details Name: Christina Castaneda MRN: [id number] DOB: July 12, 1956 Today's Date: 06/23/2024   History of Present Illness Pt is a 68 y/o F presenting to [facility] ED on 6/15 after fall, found to have L femoral neck fx and AKI. S/p retrograde IMN on 6/20. Hgb drop on 6/23 with dark ostomy output, transfused, subsequent hypotension requiring pressors. Found to have perforated bleeding duodenal ulcer s/p ex lap on 6/23. Transferred to [facility] for PCCM. 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    PT Comments  Pt tolerated treatment well today. Pt today was able to transfer to chair via maxisky. No change in DC/DME recs at this time. PT will continue to follow.     If plan is discharge home, recommend the following: Two people to help with walking and/or transfers;Two people to help with bathing/dressing/bathroom   Can travel by private vehicle     No  Equipment Recommendations  None recommended by PT    Recommendations for Other Services       Precautions / Restrictions Precautions Precautions: Fall;Other (comment) Recall of Precautions/Restrictions: Intact Precaution/Restrictions Comments: JP drain, colostomy, sacral wound Required Braces or Orthoses: Knee Immobilizer - Left Restrictions Weight Bearing Restrictions Per Provider Order: Yes LLE Weight Bearing Per Provider Order: Weight bearing as tolerated     Mobility  Bed Mobility Overal bed mobility: Needs Assistance Bed Mobility: Rolling Rolling: +2 for physical assistance, Total assist, Used rails         General bed mobility comments: rolling R/L for maxisky pad placement.    Transfers Overall transfer level: Needs assistance Equipment used: Ambulation equipment used Transfers: Bed to chair/wheelchair/BSC Sit to Stand: Via lift equipment           General transfer comment: Maxisky to chair Transfer via Lift Equipment:  [employer]  Ambulation/Gait                   Stairs             Wheelchair Mobility     Tilt Bed    Modified Rankin (Stroke Patients Only)       Balance                                            Communication Communication Communication: No apparent difficulties  Cognition Arousal: Alert Behavior During Therapy: Flat affect                             Following commands: Intact      Cueing Cueing Techniques: Verbal cues  Exercises      General Comments General comments (skin integrity, edema, etc.): VSS on 2L      Pertinent Vitals/Pain Pain Assessment Pain Assessment: Faces Faces Pain Scale: Hurts even more Pain Location: abdomen, bottom with wound care Pain Descriptors / Indicators: Guarding, Grimacing, Sore Pain Intervention(s): Monitored during session, Limited activity within patient's tolerance, Repositioned    Home Living                          Prior Function            PT Goals (current goals can now be found in the care plan section) Progress towards PT goals: Progressing toward goals  Frequency    Min 2X/week      PT Plan      Co-evaluation              AM-PAC PT 6 Clicks Mobility   Outcome Measure  Help needed turning from your back to your side while in a flat bed without using bedrails?: A Lot Help needed moving from lying on your back to sitting on the side of a flat bed without using bedrails?: Total Help needed moving to and from a bed to a chair (including a wheelchair)?: Total Help needed standing up from a chair using your arms (e.g., wheelchair or bedside chair)?: Total Help needed to walk in hospital room?: Total Help needed climbing 3-5 steps with a railing? : Total 6 Click Score: 7    End of Session Equipment Utilized During Treatment: Left knee immobilizer Activity Tolerance: Patient tolerated treatment well Patient left: in chair;with call  bell/phone within reach Nurse Communication: Mobility status;Need for lift equipment PT Visit Diagnosis: Other abnormalities of gait and mobility (R26.89);History of falling (Z91.81);Muscle weakness (generalized) (M62.81);Pain Pain - Right/Left: Left Pain - part of body: Hip;Knee;Leg     Time: 8898-8872 PT Time Calculation (min) (ACUTE ONLY): 26 min  Charges:    $Therapeutic Activity: 23-37 mins PT General Charges $$ ACUTE PT VISIT: 1 Visit                     Ivania Teagarden B, PT, DPT Acute Rehab Services [phone]    [place] 06/23/2024, 11:35 AM

## 2024-06-23 NOTE — Consult Note (Addendum)
 WOC Nurse wound follow up Wound type:  1.sacral pressure injury 2.Intertriginous dermatitis to fold of back Measurement:  5 cm x 3 cm x 3cm Wound bed:  covered by yellow/grey slough Red, moist with some yellow slough Drainage (amount, consistency, odor)   1. moderate to large amount of purulent drainage (patient reports dressing change during night shift, however silicone foam dressing is soaked with drainage at the time of visit) 2. Minimal drainage Periwound:  macerated with yeast present, area of moisture associated full thickness skin loss in the inferior- medial aspect of wound with yellow slough present intact  Dressing procedure/placement/frequency: Sacral wound- Cleanse wound with NS allow to air dry.  Pack wound with Dakin's soaked gauze and place Dakin's soaked gauze over area of full thickness skin loss for 10 days.  Cover with ABD pad and tape and change BID. Intertriginous dermatitis to fold of back- Cleanse wound with Vashe (lawson [phone]) allow to air dry. Cover wound with Xeroform gauze and  silicone foam dressing,  perform wound care every other day. 3. Will add PT wound care this week for sacrum wound.  WOC team will continue to follow weekly for wound progression.  Please re-consult if new needs arise.   Thank you,  Doyal Polite, RN, MSN, [facility] WOC Team

## 2024-06-24 ENCOUNTER — Inpatient Hospital Stay (HOSPITAL_COMMUNITY)

## 2024-06-24 DIAGNOSIS — G4733 Obstructive sleep apnea (adult) (pediatric): Secondary | ICD-10-CM | POA: Diagnosis not present

## 2024-06-24 DIAGNOSIS — K276 Chronic or unspecified peptic ulcer, site unspecified, with both hemorrhage and perforation: Secondary | ICD-10-CM | POA: Diagnosis not present

## 2024-06-24 DIAGNOSIS — J449 Chronic obstructive pulmonary disease, unspecified: Secondary | ICD-10-CM | POA: Diagnosis not present

## 2024-06-24 DIAGNOSIS — N179 Acute kidney failure, unspecified: Secondary | ICD-10-CM | POA: Diagnosis not present

## 2024-06-24 LAB — PREPARE RBC (CROSSMATCH)

## 2024-06-24 LAB — CBC
HCT: 19.7 % — ABNORMAL LOW (ref 36.0–46.0)
HCT: 25.7 % — ABNORMAL LOW (ref 36.0–46.0)
Hemoglobin: 6.1 g/dL — CL (ref 12.0–15.0)
Hemoglobin: 8.4 g/dL — ABNORMAL LOW (ref 12.0–15.0)
MCH: 31.8 pg (ref 26.0–34.0)
MCH: 31.1 pg (ref 26.0–34.0)
MCHC: 31 g/dL (ref 30.0–36.0)
MCHC: 32.7 g/dL (ref 30.0–36.0)
MCV: 102.6 fL — ABNORMAL HIGH (ref 80.0–100.0)
MCV: 95.2 fL (ref 80.0–100.0)
Platelets: 165 10*3/uL (ref 150–400)
Platelets: 184 10*3/uL (ref 150–400)
RBC: 1.92 MIL/uL — ABNORMAL LOW (ref 3.87–5.11)
RBC: 2.7 MIL/uL — ABNORMAL LOW (ref 3.87–5.11)
RDW: 17 % — ABNORMAL HIGH (ref 11.5–15.5)
RDW: 17.9 % — ABNORMAL HIGH (ref 11.5–15.5)
WBC: 8.2 10*3/uL (ref 4.0–10.5)
WBC: 7.8 10*3/uL (ref 4.0–10.5)
nRBC: 0 % (ref 0.0–0.2)
nRBC: 0 % (ref 0.0–0.2)

## 2024-06-24 LAB — BASIC METABOLIC PANEL WITH GFR
Anion gap: 8 (ref 5–15)
BUN: 49 mg/dL — ABNORMAL HIGH (ref 8–23)
CO2: 25 mmol/L (ref 22–32)
Calcium: 7.8 mg/dL — ABNORMAL LOW (ref 8.9–10.3)
Chloride: 100 mmol/L (ref 98–111)
Creatinine, Ser: 1.1 mg/dL — ABNORMAL HIGH (ref 0.44–1.00)
GFR, Estimated: 54 mL/min — ABNORMAL LOW (ref >60)
Glucose, Bld: 97 mg/dL (ref 70–99)
Potassium: 4.1 mmol/L (ref 3.5–5.1)
Sodium: 133 mmol/L — ABNORMAL LOW (ref 135–145)

## 2024-06-24 LAB — GLUCOSE, CAPILLARY: Glucose-Capillary: 103 mg/dL — ABNORMAL HIGH (ref 70–99)

## 2024-06-24 MED ORDER — PANTOPRAZOLE SODIUM 40 MG IV SOLR
80.0000 mg | Freq: Once | INTRAVENOUS | Status: AC
  Administered 2024-06-24: 80 mg via INTRAVENOUS
  Filled 2024-06-24: qty 20

## 2024-06-24 MED ORDER — PANTOPRAZOLE SODIUM 40 MG IV SOLR
40.0000 mg | Freq: Two times a day (BID) | INTRAVENOUS | Status: DC
  Administered 2024-06-24 – 2024-07-01 (×14): 40 mg via INTRAVENOUS
  Filled 2024-06-24 (×14): qty 10

## 2024-06-24 MED ORDER — FUROSEMIDE 10 MG/ML IJ SOLN
20.0000 mg | Freq: Once | INTRAMUSCULAR | Status: AC
  Administered 2024-06-24: 20 mg via INTRAVENOUS
  Filled 2024-06-24: qty 2

## 2024-06-24 MED ORDER — SODIUM CHLORIDE 0.9% IV SOLUTION: Freq: Once | INTRAVENOUS | Status: DC

## 2024-06-24 MED ORDER — SODIUM CHLORIDE 0.9% IV SOLUTION
Freq: Once | INTRAVENOUS | Status: DC
Start: 2024-06-24 — End: 2024-06-27

## 2024-06-24 MED ORDER — IOHEXOL 350 MG/ML SOLN
100.0000 mL | Freq: Once | INTRAVENOUS | Status: AC | PRN
  Administered 2024-06-24: 100 mL via INTRAVENOUS

## 2024-06-24 NOTE — Progress Notes (Signed)
 PROGRESS NOTE        PATIENT DETAILS Name: Christina Castaneda Age: 68 y.o. Sex: female Date of Birth: October 02, 1956 Admit Date: 06/07/2024 Admitting Physician Orlin Fairly, MD ERE:Floopd, Mariano SQUIBB, DO  Brief Summary: Patient is a 68 y.o.  female with history of perforated sigmoid colon-s/p Hartman's/colostomy 2017-who initially presented to [facility] following a mechanical fall-she was found to have left distal femur fracture-underwent retrograde femoral nail on 6/20, unfortunately-further hospital course complicated by perforated duodenal ulcer with acute blood loss anemia resulting in hypovolemic shock-she was evaluated by general surgery and underwent emergent exploratory laparotomy with Graham's patch-postoperatively-she was transferred to [facility] ICU.  She was stabilized and then subsequently transferred to TRH service.  On 7/2-she was noted to have black tarry stools in her colostomy-with a hemoglobin of of 6.1.  See below for further details.  Significant events: 6/15>> mechanical fall-left femur fracture/AKI-admit to TRH at [facility]. 6/20>> retrograde femoral nail by orthopedics at [facility] 6/23>> perforated duodenal ulcer-hypotension-exploratory laparotomy/placement of Arlyss supplies.  Remained intubated postprocedure-transferred to [facility] ICU. 6/24>> extubated 6/25>> off pressors-out of ICU-drop in hemoglobin but nonbloody ostomy output. 6/26>> transferred to TRH service 7/02>> Black tarry stools in ostomy-Hb down to 6.1-General Surgery reconsulted-too early for EGD-stat CT angio and IR consulted for embolization.  Significant studies: 6/15>> CT left knee: Acute comminuted fracture of distal left femoral shaft. 6/16>> renal ultrasound: No hydronephrosis 6/23>> CT angiogram abdomen: Perforated and bleeding peptic ulcer disease affecting proximal duodenum-appears to be hematoma in the gastric antrum/proximal duodenum. 6/26>> upper GI series with KUB: No evidence of contrast  leak.  Significant microbiology data: 6/23>> blood culture: No growth 6/23>> peritoneal fluid: Abundant Candida albicans/moderate Candida glabrata.  Procedures: 6/20>> retrograde femoral nail by orthopedics at [facility] 6/23>> exploratory laparotomy with omental Graham's patch repair  Consults: GI at [facility] at [facility] and [facility] IR  Subjective: Lying comfortably in bed-denies any chest pain or shortness of breath.  Black stools noted in ostomy.  Objective: Vitals: Blood pressure (!) 100/50, pulse 64, temperature 97.6 F (36.4 C), temperature source Oral, resp. rate 19, height 5' 5 (1.651 m), weight 117.6 kg, last menstrual period 11/18/2012, SpO2 100%.   Exam: Gen Exam:Alert awake-not in any distress HEENT:atraumatic, normocephalic Chest: B/L clear to auscultation anteriorly CVS:S1S2 regular Abdomen:soft non tender, non distended Extremities:no edema Neurology: Non focal Skin: no rash  Pertinent Labs/Radiology:    Latest Ref Rng & Units 06/24/2024    4:04 AM 06/23/2024    4:08 AM 06/22/2024    4:59 AM  CBC  WBC 4.0 - 10.5 K/uL 8.2  7.1  6.2   Hemoglobin 12.0 - 15.0 g/dL 6.1  8.7  8.1   Hematocrit 36.0 - 46.0 % 19.7  27.8  25.7   Platelets 150 - 400 K/uL 165  112  PLATELET CLUMPS NOTED ON SMEAR, UNABLE TO ESTIMATE     Lab Results  Component Value Date   NA 132 (L) 06/23/2024   K 4.2 06/23/2024   CL 101 06/23/2024   CO2 26 06/23/2024     Assessment/Plan: Hemorrhagic and septic shock secondary to perforated bleeding duodenal ulcer with acute blood loss anemia BP now stable-s/p exploratory laparotomy with Arlyss patch repair on 6/23 at [facility] Has completed a course of empiric antibiotics -required a total of 6 units of PRBC and 1 unit of pheresis platelets. Unfortunately-Hb down  to 6.1 with black starry stools noted in ostomy bag on 7/2-switch PPI to IV-keep n.p.o.-discussed with general surgery-too early to perform EGD-recommendations are for stat CT angio and IR  consult for embolization.  Will get a total of 2 units of PRBC today. Follow CBC-await IR/general surgery evaluation.  Mechanical fall with left femur fracture-s/p retrograde left femoral nail on 6/20 Per orthopedics-WBAT LLE, knee immobilizer when ambulating, remove staples in 2 weeks.  Follow-up with Dr. Oneil Salvage at [place] 2 weeks from discharge.  AKI Likely hemodynamically mediated Resolved  Acute urinary retention Foley catheter remains in place-given plans for potential embolization-will keep in place for now-voiding trial when she is a bit more stable.  HTN BP soft-continue to hold all antihypertensives  HLD Continue to hold Crestor -resume when able-currently n.p.o.  COPD Stable-no wheezing Continue bronchodilators Stop further steroids at this point.  OSA CPAP nightly  Hypothyroidism Synthroid   History of perforated sigmoid colon-s/p Hartman/colostomy 2017  Debility/deconditioning PT/OT eval-SNF recommended.  Nutrition Status: Nutrition Problem: Increased nutrient needs Etiology: acute illness, wound healing Signs/Symptoms: estimated needs Interventions: Refer to RD note for recommendations   Pressure Ulcer: Agree with assessment and plan as outlined below. Pressure Injury 05/27/24 Sacrum Stage 2 -  Partial thickness loss of dermis presenting as a shallow open injury with a red, pink wound bed without slough. 4 small stage II present, each measures 0.4x 0.4 x 0.2 (Active)  05/27/24 1815  Location: Sacrum  Location Orientation:   Staging: Stage 2 -  Partial thickness loss of dermis presenting as a shallow open injury with a red, pink wound bed without slough.  Wound Description (Comments): 4 small stage II present, each measures 0.4x 0.4 x 0.2  DO NOT USE:  Present on Admission:   Dressing Type Foam - Lift dressing to assess site every shift 06/19/24 1130    Morbid Obesity: Estimated body mass index is 43.14 kg/m as calculated from the  following:   Height as of this encounter: 5' 5 (1.651 m).   Weight as of this encounter: 117.6 kg.   Code status:   Code Status: Full Code   DVT Prophylaxis: Place and maintain sequential compression device Start: 06/18/24 1325 SCDs Start: 06/15/24 1937 Place and maintain sequential compression device Start: 06/15/24 0155 SQ heparin  discontinued 7/2-due to high GI bleeding with ABLA.  Family Communication: Spouse [phone]-updated 7/2.    Disposition Plan: Status is: Inpatient Remains inpatient appropriate because: Severity of illness   Planned Discharge Destination:Skilled nursing facility   Diet: Diet Order             Diet NPO time specified  Diet effective now                     Antimicrobial agents: Anti-infectives (From admission, onward)    Start     Dose/Rate Route Frequency Ordered Stop   06/16/24 1800  vancomycin  (VANCOCIN ) IVPB 1000 mg/200 mL premix  Status:  Discontinued        1,000 mg 200 mL/hr over 60 Minutes Intravenous Every 24 hours 06/15/24 1317 06/15/24 1328   06/15/24 1700  vancomycin  (VANCOREADY) IVPB 2000 mg/400 mL  Status:  Discontinued        2,000 mg 200 mL/hr over 120 Minutes Intravenous  Once 06/15/24 1317 06/15/24 1328   06/15/24 1600  fluconazole  (DIFLUCAN ) IVPB 400 mg  Status:  Discontinued        400 mg 100 mL/hr over 120 Minutes Intravenous Every 24 hours 06/15/24 1317 06/16/24  9085   06/15/24 1500  metroNIDAZOLE  (FLAGYL ) IVPB 500 mg  Status:  Discontinued        500 mg 100 mL/hr over 60 Minutes Intravenous Every 12 hours 06/15/24 1216 06/15/24 1255   06/15/24 1400  ciprofloxacin  (CIPRO ) IVPB 400 mg  Status:  Discontinued        400 mg 200 mL/hr over 60 Minutes Intravenous Every 12 hours 06/15/24 1216 06/15/24 1255   06/15/24 1400  piperacillin -tazobactam (ZOSYN ) IVPB 3.375 g        3.375 g 12.5 mL/hr over 240 Minutes Intravenous Every 8 hours 06/15/24 1317 06/20/24 2359   06/15/24 1400  vancomycin  (VANCOCIN )  IVPB 1000 mg/200 mL premix  Status:  Discontinued        1,000 mg 200 mL/hr over 60 Minutes Intravenous Every 24 hours 06/15/24 1328 06/16/24 0914   06/15/24 1333  vancomycin  (VANCOCIN ) 1-5 GM/200ML-% IVPB       Note to Pharmacy: Romona Shaver E: cabinet override      06/15/24 1333 06/15/24 1410   06/15/24 1221  ciprofloxacin  (CIPRO ) 400 MG/200ML IVPB  Status:  Discontinued       Note to Pharmacy: Dannielle Railing S: cabinet override      06/15/24 1221 06/15/24 1343   06/15/24 1221  metroNIDAZOLE  (FLAGYL ) 500 MG/100ML IVPB  Status:  Discontinued       Note to Pharmacy: Dannielle Railing S: cabinet override      06/15/24 1221 06/15/24 1343   06/12/24 1223  clindamycin  (CLEOCIN ) 900 MG/50ML IVPB       Note to Pharmacy: Elaine Nest L: cabinet override      06/12/24 1223 06/12/24 1332   06/12/24 1131  ceFAZolin  (ANCEF ) 2-4 GM/100ML-% IVPB  Status:  Discontinued       Note to Pharmacy: Billy Sluder H: cabinet override      06/12/24 1131 06/12/24 1434   06/11/24 1300  clindamycin  (CLEOCIN ) IVPB 900 mg  Status:  Discontinued        900 mg 100 mL/hr over 30 Minutes Intravenous On call to O.R. 06/10/24 1041 06/10/24 1447   06/11/24 1300  ceFAZolin  (ANCEF ) IVPB 2g/100 mL premix        2 g 200 mL/hr over 30 Minutes Intravenous On call to O.R. 06/10/24 1447 06/12/24 1908        MEDICATIONS: Scheduled Meds:  sodium chloride    Intravenous Once   sodium chloride    Intravenous Once   arformoterol   15 mcg Nebulization Q12H   budesonide  (PULMICORT ) nebulizer solution  0.5 mg Nebulization BID   Chlorhexidine  Gluconate Cloth  6 each Topical Daily   feeding supplement  237 mL Oral BID BM   furosemide   20 mg Intravenous Once   levothyroxine   150 mcg Oral Q0600   multivitamin with minerals  1 tablet Oral Daily   nutrition supplement (JUVEN)  1 packet Oral BID AC & HS   nystatin    Topical BID   pantoprazole  (PROTONIX ) IV  40 mg Intravenous Q12H   pantoprazole  (PROTONIX ) IV  80 mg  Intravenous Once   predniSONE  40 mg Oral Q breakfast   sodium chloride  flush  10-40 mL Intracatheter Q12H   sodium chloride  flush  3 mL Intravenous Q12H   sodium hypochlorite   Irrigation BID   Continuous Infusions: PRN Meds:.dextrose , iohexol , ipratropium-albuterol , magic mouthwash w/lidocaine , [DISCONTINUED] ondansetron  **OR** ondansetron  (ZOFRAN ) IV, mouth rinse, oxyCODONE , polyethylene glycol, prochlorperazine , sodium chloride  flush, sodium chloride  flush   I have personally reviewed following labs and imaging studies  LABORATORY DATA: CBC: Recent Labs  Lab 06/20/24 0428 06/21/24 0420 06/22/24 0459 06/23/24 0408 06/24/24 0404  WBC 7.1 6.5 6.2 7.1 8.2  HGB 8.2* 8.1* 8.1* 8.7* 6.1*  HCT 26.3* 26.0* 25.7* 27.8* 19.7*  MCV 101.2* 101.6* 102.0* 100.4* 102.6*  PLT PLATELET CLUMPS NOTED ON SMEAR, UNABLE TO ESTIMATE 139* PLATELET CLUMPS NOTED ON SMEAR, UNABLE TO ESTIMATE 112* 165    Basic Metabolic Panel: Recent Labs  Lab 06/19/24 0405 06/20/24 0428 06/21/24 0420 06/22/24 0459 06/23/24 0408  NA 144 141 137 134* 132*  K 4.1 4.4 4.6 4.3 4.2  CL 110 108 102 102 101  CO2 28 27 26 27 26   GLUCOSE 85 94 88 84 83  BUN 26* 20 26* 26* 32*  CREATININE 1.14* 1.08* 1.03* 1.02* 0.95  CALCIUM  7.9* 7.7* 7.9* 8.0* 8.1*  MG 1.7 2.1 1.9 1.9  --   PHOS 2.4* 2.9 3.1 3.1  --     GFR: Estimated Creatinine Clearance: 70.6 mL/min (by C-G formula based on SCr of 0.95 mg/dL).  Liver Function Tests: Recent Labs  Lab 06/18/24 0031 06/19/24 0405 06/22/24 0459  AST 52* 64* 35  ALT 26 29 22   ALKPHOS 38 40 42  BILITOT 0.7 0.6 0.6  PROT 4.2* 4.0* 4.5*  ALBUMIN <1.5* <1.5* <1.5*   No results for input(s): LIPASE, AMYLASE in the last 168 hours. No results for input(s): AMMONIA in the last 168 hours.  Coagulation Profile: No results for input(s): INR, PROTIME in the last 168 hours.  Cardiac Enzymes: No results for input(s): CKTOTAL, CKMB, CKMBINDEX, TROPONINI in the  last 168 hours.  BNP (last 3 results) No results for input(s): PROBNP in the last 8760 hours.  Lipid Profile: Recent Labs    06/22/24 0459  TRIG 116    Thyroid  Function Tests: No results for input(s): TSH, T4TOTAL, FREET4, T3FREE, THYROIDAB in the last 72 hours.  Anemia Panel: No results for input(s): VITAMINB12, FOLATE, FERRITIN, TIBC, IRON, RETICCTPCT in the last 72 hours.  Urine analysis:    Component Value Date/Time   COLORURINE YELLOW 06/07/2024 2026   APPEARANCEUR HAZY (A) 06/07/2024 2026   LABSPEC 1.015 06/07/2024 2026   PHURINE 5.0 06/07/2024 2026   GLUCOSEU NEGATIVE 06/07/2024 2026   HGBUR NEGATIVE 06/07/2024 2026   BILIRUBINUR NEGATIVE 06/07/2024 2026   KETONESUR NEGATIVE 06/07/2024 2026   PROTEINUR NEGATIVE 06/07/2024 2026   NITRITE NEGATIVE 06/07/2024 2026   LEUKOCYTESUR NEGATIVE 06/07/2024 2026    Sepsis Labs: Lactic Acid, Venous    Component Value Date/Time   LATICACIDVEN 1.0 05/27/2024 1945    MICROBIOLOGY: Recent Results (from the past 240 hours)  MRSA Next Gen by PCR, Nasal     Status: None   Collection Time: 06/15/24  8:01 AM   Specimen: Nasal Mucosa; Nasal Swab  Result Value Ref Range Status   MRSA by PCR Next Gen NOT DETECTED NOT DETECTED Final    Comment: (NOTE) The GeneXpert MRSA Assay (FDA approved for NASAL specimens only), is one component of a comprehensive MRSA colonization surveillance program. It is not intended to diagnose MRSA infection nor to guide or monitor treatment for MRSA infections. Test performance is not FDA approved in patients less than 29 years old. Performed at [facility], [address], [place], [place] [address]   Aerobic/Anaerobic Culture w Gram Stain (surgical/deep wound)     Status: None   Collection Time: 06/15/24  3:24 PM   Specimen: Path fluid; Body Fluid  Result Value Ref Range Status   Specimen  Description   Final    PERITONEAL Performed at [facility], [address],  [place], [place] [address]    Special Requests   Final    NONE Performed at [facility], [address], [place], [place] [address]    Gram Stain   Final    RARE WBC PRESENT, PREDOMINANTLY PMN NO ORGANISMS SEEN    Culture   Final    ABUNDANT CANDIDA ALBICANS MODERATE CANDIDA GLABRATA NO ANAEROBES ISOLATED Performed at [facility], [address], [place], [place] [address]    Report Status 06/20/2024 FINAL  Final  Culture, blood (Routine X 2) w Reflex to ID Panel     Status: None   Collection Time: 06/15/24  5:13 PM   Specimen: BLOOD  Result Value Ref Range Status   Specimen Description BLOOD LEFT ANTECUBITAL  Final   Special Requests   Final    BOTTLES DRAWN AEROBIC AND ANAEROBIC Blood Culture adequate volume   Culture   Final    NO GROWTH 5 DAYS Performed at [facility], [address] [place], [place] [address]    Report Status 06/20/2024 FINAL  Final  Culture, blood (Routine X 2) w Reflex to ID Panel     Status: None   Collection Time: 06/15/24  5:24 PM   Specimen: BLOOD  Result Value Ref Range Status   Specimen Description BLOOD LEFT ANTECUBITAL  Final   Special Requests   Final    BOTTLES DRAWN AEROBIC AND ANAEROBIC Blood Culture results may not be optimal due to an inadequate volume of blood received in culture bottles   Culture   Final    NO GROWTH 5 DAYS Performed at [facility], [address], [place], [place] [address]    Report Status 06/20/2024 FINAL  Final    RADIOLOGY STUDIES/RESULTS: No results found.   LOS: 17 days   Donalda Applebaum, MD  [facility]    To contact the attending provider between 7A-7P or the covering provider during after hours 7P-7A, please log into the web site [email or web address] and access using universal Elliott password for that web site. If you do not have the password, please call the hospital operator.  06/24/2024, 9:12 AM

## 2024-06-24 NOTE — Progress Notes (Signed)
 9 Days Post-Op  Subjective: Patient with no new complaints today.  Has been eating ok.  We got asked to see due to a new low hgb of 6 and melena in her colostomy bag.  She is not having abdominal pain.  ROS: See above, otherwise other systems negative  Objective: Vital signs in last 24 hours: Temp:  [97.6 F (36.4 C)-98.7 F (37.1 C)] 98.3 F (36.8 C) (07/02 0914) Pulse Rate:  [64-124] 70 (07/02 0914) Resp:  [16-25] 16 (07/02 0914) BP: (100-124)/(47-79) 103/47 (07/02 0914) SpO2:  [93 %-100 %] 93 % (07/02 0914) Weight:  [117.6 kg] 117.6 kg (07/02 0411) Last BM Date : 06/23/24  Intake/Output from previous day: 07/01 0701 - 07/02 0700 In: 720 [P.O.:720] Out: 1255 [Urine:700; Drains:80; Stool:475] Intake/Output this shift: Total I/O In: -  Out: 1050 [Urine:850; Stool:200]  PE: Abd: soft, appropriately tender, midline wound clean and honeycomb in place, JP with serosang output, colostomy with melena present.  Lab Results:  Recent Labs    06/23/24 0408 06/24/24 0404  WBC 7.1 8.2  HGB 8.7* 6.1*  HCT 27.8* 19.7*  PLT 112* 165   BMET Recent Labs    06/22/24 0459 06/23/24 0408  NA 134* 132*  K 4.3 4.2  CL 102 101  CO2 27 26  GLUCOSE 84 83  BUN 26* 32*  CREATININE 1.02* 0.95  CALCIUM  8.0* 8.1*   PT/INR No results for input(s): LABPROT, INR in the last 72 hours.  CMP     Component Value Date/Time   NA 132 (L) 06/23/2024 0408   K 4.2 06/23/2024 0408   CL 101 06/23/2024 0408   CO2 26 06/23/2024 0408   GLUCOSE 83 06/23/2024 0408   BUN 32 (H) 06/23/2024 0408   CREATININE 0.95 06/23/2024 0408   CALCIUM  8.1 (L) 06/23/2024 0408   PROT 4.5 (L) 06/22/2024 0459   ALBUMIN <1.5 (L) 06/22/2024 0459   AST 35 06/22/2024 0459   ALT 22 06/22/2024 0459   ALKPHOS 42 06/22/2024 0459   BILITOT 0.6 06/22/2024 0459   GFRNONAA >60 06/23/2024 0408   GFRAA >60 06/05/2016 1320   Lipase     Component Value Date/Time   LIPASE 64 (H) 03/28/2016 1705        Studies/Results: No results found.   Anti-infectives: Anti-infectives (From admission, onward)    Start     Dose/Rate Route Frequency Ordered Stop   06/16/24 1800  vancomycin  (VANCOCIN ) IVPB 1000 mg/200 mL premix  Status:  Discontinued        1,000 mg 200 mL/hr over 60 Minutes Intravenous Every 24 hours 06/15/24 1317 06/15/24 1328   06/15/24 1700  vancomycin  (VANCOREADY) IVPB 2000 mg/400 mL  Status:  Discontinued        2,000 mg 200 mL/hr over 120 Minutes Intravenous  Once 06/15/24 1317 06/15/24 1328   06/15/24 1600  fluconazole  (DIFLUCAN ) IVPB 400 mg  Status:  Discontinued        400 mg 100 mL/hr over 120 Minutes Intravenous Every 24 hours 06/15/24 1317 06/16/24 0914   06/15/24 1500  metroNIDAZOLE  (FLAGYL ) IVPB 500 mg  Status:  Discontinued        500 mg 100 mL/hr over 60 Minutes Intravenous Every 12 hours 06/15/24 1216 06/15/24 1255   06/15/24 1400  ciprofloxacin  (CIPRO ) IVPB 400 mg  Status:  Discontinued        400 mg 200 mL/hr over 60 Minutes Intravenous Every 12 hours 06/15/24 1216 06/15/24 1255   06/15/24 1400  piperacillin -tazobactam (  ZOSYN ) IVPB 3.375 g        3.375 g 12.5 mL/hr over 240 Minutes Intravenous Every 8 hours 06/15/24 1317 06/20/24 2359   06/15/24 1400  vancomycin  (VANCOCIN ) IVPB 1000 mg/200 mL premix  Status:  Discontinued        1,000 mg 200 mL/hr over 60 Minutes Intravenous Every 24 hours 06/15/24 1328 06/16/24 0914   06/15/24 1333  vancomycin  (VANCOCIN ) 1-5 GM/200ML-% IVPB       Note to Pharmacy: Romona Shaver E: cabinet override      06/15/24 1333 06/15/24 1410   06/15/24 1221  ciprofloxacin  (CIPRO ) 400 MG/200ML IVPB  Status:  Discontinued       Note to Pharmacy: Dannielle Railing S: cabinet override      06/15/24 1221 06/15/24 1343   06/15/24 1221  metroNIDAZOLE  (FLAGYL ) 500 MG/100ML IVPB  Status:  Discontinued       Note to Pharmacy: Dannielle Railing S: cabinet override      06/15/24 1221 06/15/24 1343   06/12/24 1223  clindamycin   (CLEOCIN ) 900 MG/50ML IVPB       Note to Pharmacy: Elaine Nest L: cabinet override      06/12/24 1223 06/12/24 1332   06/12/24 1131  ceFAZolin  (ANCEF ) 2-4 GM/100ML-% IVPB  Status:  Discontinued       Note to Pharmacy: Billy Sluder H: cabinet override      06/12/24 1131 06/12/24 1434   06/11/24 1300  clindamycin  (CLEOCIN ) IVPB 900 mg  Status:  Discontinued        900 mg 100 mL/hr over 30 Minutes Intravenous On call to O.R. 06/10/24 1041 06/10/24 1447   06/11/24 1300  ceFAZolin  (ANCEF ) IVPB 2g/100 mL premix        2 g 200 mL/hr over 30 Minutes Intravenous On call to O.R. 06/10/24 1447 06/12/24 1908        Assessment/Plan POD 9, s/p ex lap with graham patch repair and JP drain placement for Perforated duodenal ulcer, possible bleeding ulcer as well, Dr. Johanna at [facility] 6/23, now with melena -now with melena today.  Agree with primary for CTA to evaluate for bleeding.  Patient did have bleeding from ulcer bed noted by surgeon at [facility] during her surgery up there.  This has stopped post op until today she started with melena and a hgb of 6.1 today. -CTA and if + IR eval for angioembolization as she is not a candidate for endo given recent repair or surgery given recent surgery. -h. Pylori negative -will actually leave her surgical drain for right now.  If she requires embo, we will be able to monitor this to assure no leak from necrosis. -multi-modal pain control -PT, OOB as able pending femur fx recs -d/w primary service on ward  FEN - NPO VTE - on hold due to anemia ID - zosyn , completed  I reviewed hospitalist notes, last 24 h vitals and pain scores, last 48 h intake and output, last 24 h labs and trends, and last 24 h imaging results.   LOS: 17 days    Burnard FORBES Banter , [facility] 06/24/2024, 11:15 AM Please see Amion for pager number during day hours 7:00am-4:30pm or 7:00am -11:30am on weekends

## 2024-06-24 NOTE — Progress Notes (Signed)
 PT Cancellation Note  Patient Details Name: Christina Castaneda MRN: [id number] DOB: 1956-12-23   Cancelled Treatment:    Reason Eval/Treat Not Completed: (P) Other (comment) Pt request PT follow back for wound care after her blood transfusion has stopped and her pain medication has been administered. PT will follow back for sacral wound evaluation and treatment as able this afternoon.   Rishav Rockefeller B. [name] PT, DPT Acute Rehabilitation Services Please use secure chat or  Call Office [phone]  .  [name] 06/24/2024, 11:24 AM

## 2024-06-24 NOTE — Progress Notes (Addendum)
 Physical Therapy Wound Treatment Patient Details  Name: Christina Castaneda MRN: [id number] Date of Birth: 03/13/1956  Today's Date: 06/24/2024 Time: 1221-1252 Time Calculation (min): 31 min  Subjective  Subjective Assessment Subjective: Pt worried about GI bleeding Patient and Family Stated Goals: go home Date of Onset:  (unknown) Prior Treatments: cleansing, Dakins soaked gauze, covered by sacral foam  Pain Score:  6/10  Wound Assessment  Wound 06/08/24 1104 Pressure Injury Coccyx Medial Stage 3 -  Full thickness tissue loss. Subcutaneous fat may be visible but bone, tendon or muscle are NOT exposed. (Active)  Wound Image   06/24/24 1400  Site / Wound Assessment Pink;Red;Yellow;Friable;Granulation tissue 06/24/24 1400  Peri-wound Assessment Erythema (blanchable);Maceration;Pink;Denuded 06/24/24 1400  Wound Length (cm) 6 cm 06/24/24 1400  Wound Width (cm) 4 cm 06/24/24 1400  Wound Surface Area (cm^2) 18.85 cm^2 06/24/24 1400  Wound Depth (cm) 3 cm 06/24/24 1400  Wound Volume (cm^3) 37.699 cm^3 06/24/24 1400  Drainage Description Serous;Purulent 06/24/24 1400  Drainage Amount Moderate 06/24/24 1400  Treatment Cleansed;Irrigation 06/24/24 1400  Dressing Type ABD;Dakin's-soaked gauze;Barrier Film (skin prep) 06/24/24 1400  Dressing Changed Changed 06/24/24 1400  Dressing Status Clean, Dry, Intact 06/24/24 1400  State of Healing Early/partial granulation 06/24/24 1400  % Wound base Red or Granulating 30% 06/24/24 1400  % Wound base Yellow/Fibrinous Exudate 70% 06/24/24 1400  % Wound base Black/Eschar 0% 06/15/24 0735  % Wound base Other/Granulation Tissue (Comment) 80% 06/15/24 0735  Margins Unattached edges (unapproximated) 06/24/24 1400           Wound Assessment and Plan  Wound Therapy - Assess/Plan/Recommendations Wound Therapy - Clinical Statement: Pt with non-healing sacral wound with significant yellow slough in the base of the wound. Pt will benefit from continued sharps  debridement to decreased bioburden and facilitate wound healing. Wound Therapy - Functional Problem List: decrease mobility Factors Delaying/Impairing Wound Healing: Immobility, Multiple medical problems, Polypharmacy, Vascular compromise, Incontinence Hydrotherapy Plan: Debridement, Dressing change, Patient/family education Wound Therapy - Frequency: 2X / week Wound Therapy - Follow Up Recommendations: dressing changes by RN  Wound Therapy Goals- Improve the function of patient's integumentary system by progressing the wound(s) through the phases of wound healing (inflammation - proliferation - remodeling) by: Wound Therapy Goals - Improve the function of patient's integumentary system by progressing the wound(s) through the phases of wound healing by: Decrease Necrotic Tissue to: 50 Decrease Necrotic Tissue - Progress: Goal set today Increase Granulation Tissue to: 50 Increase Granulation Tissue - Progress: Goal set today Improve Drainage Characteristics: Min Improve Drainage Characteristics - Progress: Goal set today Goals/treatment plan/discharge plan were made with and agreed upon by patient/family: Yes Time For Goal Achievement: 7 days Wound Therapy - Potential for Goals: Fair  Goals will be updated until maximal potential achieved or discharge criteria met.  Discharge criteria: when goals achieved, discharge from hospital, MD decision/surgical intervention, no progress towards goals, refusal/missing three consecutive treatments without notification or medical reason.  GP     Charges PT Wound Care Charges $Wound Debridement up to 20 cm: < or equal to 20 cm $ Wound Debridement each add'l 20 sqcm: 1 $PT Hydrotherapy Visit: 1 Visit     Nikaela Coyne B. [name] PT, DPT Acute Rehabilitation Services Please use secure chat or  Call Office [phone]   Almarie KATHEE Fleeta [facility] 06/24/2024, 2:49 PM

## 2024-06-24 NOTE — Progress Notes (Signed)
 OT Cancellation Note  Patient Details Name: MEDA DUDZINSKI MRN: [id number] DOB: [birth year]   Cancelled Treatment:    Reason Eval/Treat Not Completed: Pt declined, fatigue/lethargy limiting ability to participate. Currently receiving blood transfusion. Will continue to follow.   Kennth Mliss Helling 06/24/2024, 3:49 PM Mliss HERO, OTR/L Acute Rehabilitation Services Office: [phone]

## 2024-06-24 NOTE — Plan of Care (Signed)
  IR asked to eval for possible embolization for GI bleed.  CTA showed no extravasation -  images reviewed by Dr. Jenna.  No immediate need for embolization at this time.  SARI RAMAN Falicity Sheets PA-C 06/24/2024 3:54 PM

## 2024-06-24 NOTE — Progress Notes (Incomplete)
 Nutrition Follow-up  DOCUMENTATION CODES:   Obesity unspecified  INTERVENTION:  Monitor diet advancement and tolerance, recommend DYS3 diet when advanced for ease of consumption Continue Ensure Plus High Protein po daily, each supplement provides 350 kcal and 20 grams of protein now that diet advanced to full liquid Continue Juven BID to support wound healing Add MVI w/ minerals   NUTRITION DIAGNOSIS:  Increased nutrient needs related to acute illness, wound healing as evidenced by estimated needs.  GOAL:  Patient will meet greater than or equal to 90% of their needs  MONITOR:  Diet advancement, Labs, Weight trends, I & O's, Skin  REASON FOR ASSESSMENT:  Rounds    ASSESSMENT:   Pt admitted after a recent fall leading to L distal femur fracture s/p L femoral nailing. PMH significant for perforated sigmoid colon s/p Hartmann procedure, s/p colostomy revision, HTN.  6/20: s/p L femoral nail 6/23: perforated bleeding duodenal ulcer; s/p exlap with graham patch repair, 1 prbc transfused 6/25: 1 prbc transfused 6/26: NGT removed, UGI: no leak; txr out of ICU, TPN initiated, clear liquid diet initiated 6/27: advanced to full liquid diet 6/30: advanced to GI soft diet; TPN discontinued 7/2 black/tarry ostomy output, Hgb 6.1; 1 PRBC transfused; CTA to evaluate need for   MD starting TPN yesterday d/t low albumin. Long-term TPN use not likely as diet being advanced. She was advanced to clear liquid diet yesterday.    Average Meal Intake No documented intake since diet advancement   She denies much intake of oral diet. Discussed TPN and its indication as well as expected timeframe. She is in visible pain today and unwilling/unable to engage much with this Clinical research associate. Endorses no N/V. Ostomy bag observed w/ brown, bilious output.    Encouraged intake of advanced diet and, at the very least, supplementation to meet needs in an effort to meet her minimum estimated needs, as TPN only  providing approximately 60% calorie and protein needs. Will need DYS3 diet once advanced to aid in consumption given that she has no teeth and lost her dentures.   Admit Weight: 105kg - ?accuracy, appears pulled forward/stated Current Weight: 122.8kg +generalized non-pitting edema   Suspect documented admission weights are likely estimated/stated. Between 09/02/23-05/31/24, pt appears to have had a weight loss of 13.1% which is not clinically significant for time frame though is concerning. Edema has improved and not significant today.     Intake/Output Summary (Last 24 hours) at 06/24/2024 1550 Last data filed at 06/24/2024 1200 Gross per 24 hour  Intake 624 ml  Output 2055 ml  Net -1431 ml    Net IO Since Admission: 1,650.57 mL [06/24/24 1550]    WBC have normalized. BUN has decreased to marginal elevation. Crt normalizing. Insulin  has been discontinued.   Drains/lines: UOP: x24 hours RUQ colostomy: no documented output Midline abdominal JP drain: 90ml  so far today   Medications/Drips: nystatin  Pantoprazole  2g IV mg sulfate x1 20mmol K+ phos x1 IV ABX   Labs:  Sodium 144 BUN 26 Cr 1.14 PHOS 2.4 Albumin <1.5 AST 64 CBG's 81-85 x24 hours  Diet Order:   Diet Order             Diet NPO time specified  Diet effective now             EDUCATION NEEDS:  Not appropriate for education at this time  Skin:  Skin Assessment: Skin Integrity Issues: Skin Integrity Issues:: Stage III, Stage II, Other (Comment) Stage II: sacrum Stage III:  mid coccyx Other: IAD L buttock, L groin, abdomen, bilateral sacrum, vagina  Last BM:  6/27  Height:  Ht Readings from Last 1 Encounters:  06/15/24 5' 5 (1.651 m)   Weight:  Wt Readings from Last 1 Encounters:  06/24/24 117.6 kg   Ideal Body Weight:  56.8 kg  BMI:  Body mass index is 43.14 kg/m.  Estimated Nutritional Needs:   Kcal:  1600-1800  Protein:  85-100g  Fluid:  >/=1.6L  Blair Deaner MS, RD,  LDN Registered Dietitian Clinical Nutrition RD Inpatient Contact Info in Amion

## 2024-06-25 ENCOUNTER — Encounter (HOSPITAL_COMMUNITY): Payer: Self-pay

## 2024-06-25 ENCOUNTER — Inpatient Hospital Stay (HOSPITAL_COMMUNITY)

## 2024-06-25 DIAGNOSIS — K276 Chronic or unspecified peptic ulcer, site unspecified, with both hemorrhage and perforation: Secondary | ICD-10-CM | POA: Diagnosis not present

## 2024-06-25 DIAGNOSIS — J449 Chronic obstructive pulmonary disease, unspecified: Secondary | ICD-10-CM | POA: Diagnosis not present

## 2024-06-25 DIAGNOSIS — N179 Acute kidney failure, unspecified: Secondary | ICD-10-CM | POA: Diagnosis not present

## 2024-06-25 DIAGNOSIS — G4733 Obstructive sleep apnea (adult) (pediatric): Secondary | ICD-10-CM | POA: Diagnosis not present

## 2024-06-25 HISTORY — PX: IR US GUIDE VASC ACCESS RIGHT: IMG2390

## 2024-06-25 HISTORY — PX: IR EMBO ART  VEN HEMORR LYMPH EXTRAV  INC GUIDE ROADMAPPING: IMG5450

## 2024-06-25 HISTORY — PX: IR ANGIOGRAM VISCERAL SELECTIVE: IMG657

## 2024-06-25 HISTORY — PX: IR TRANSCATH/EMBOLIZ: IMG695

## 2024-06-25 LAB — CBC
HCT: 21.9 % — ABNORMAL LOW (ref 36.0–46.0)
HCT: 27.8 % — ABNORMAL LOW (ref 36.0–46.0)
Hemoglobin: 7.2 g/dL — ABNORMAL LOW (ref 12.0–15.0)
Hemoglobin: 9.2 g/dL — ABNORMAL LOW (ref 12.0–15.0)
MCH: 31.6 pg (ref 26.0–34.0)
MCH: 30.7 pg (ref 26.0–34.0)
MCHC: 32.9 g/dL (ref 30.0–36.0)
MCHC: 33.1 g/dL (ref 30.0–36.0)
MCV: 96.1 fL (ref 80.0–100.0)
MCV: 92.7 fL (ref 80.0–100.0)
Platelets: 181 10*3/uL (ref 150–400)
Platelets: 195 10*3/uL (ref 150–400)
RBC: 2.28 MIL/uL — ABNORMAL LOW (ref 3.87–5.11)
RBC: 3 MIL/uL — ABNORMAL LOW (ref 3.87–5.11)
RDW: 18.2 % — ABNORMAL HIGH (ref 11.5–15.5)
RDW: 18.1 % — ABNORMAL HIGH (ref 11.5–15.5)
WBC: 8 10*3/uL (ref 4.0–10.5)
WBC: 7.2 10*3/uL (ref 4.0–10.5)
nRBC: 0 % (ref 0.0–0.2)
nRBC: 0.3 % — ABNORMAL HIGH (ref 0.0–0.2)

## 2024-06-25 LAB — BASIC METABOLIC PANEL WITH GFR
Anion gap: 7 (ref 5–15)
BUN: 52 mg/dL — ABNORMAL HIGH (ref 8–23)
CO2: 26 mmol/L (ref 22–32)
Calcium: 8.2 mg/dL — ABNORMAL LOW (ref 8.9–10.3)
Chloride: 100 mmol/L (ref 98–111)
Creatinine, Ser: 1.05 mg/dL — ABNORMAL HIGH (ref 0.44–1.00)
GFR, Estimated: 57 mL/min — ABNORMAL LOW (ref >60)
Glucose, Bld: 90 mg/dL (ref 70–99)
Potassium: 3.8 mmol/L (ref 3.5–5.1)
Sodium: 133 mmol/L — ABNORMAL LOW (ref 135–145)

## 2024-06-25 LAB — PREPARE RBC (CROSSMATCH)

## 2024-06-25 MED ORDER — LIDOCAINE-EPINEPHRINE 1 %-1:100000 IJ SOLN
INTRAMUSCULAR | Status: AC
Start: 2024-06-25 — End: 2024-06-25
  Filled 2024-06-25: qty 1

## 2024-06-25 MED ORDER — DIPHENHYDRAMINE HCL 50 MG/ML IJ SOLN
INTRAMUSCULAR | Status: AC | PRN
  Administered 2024-06-25 (×2): 25 mg via INTRAVENOUS

## 2024-06-25 MED ORDER — ONDANSETRON HCL 4 MG/2ML IJ SOLN
INTRAMUSCULAR | Status: AC
  Filled 2024-06-25: qty 2

## 2024-06-25 MED ORDER — DIPHENHYDRAMINE HCL 50 MG/ML IJ SOLN
INTRAMUSCULAR | Status: AC
  Filled 2024-06-25: qty 1

## 2024-06-25 MED ORDER — SODIUM CHLORIDE 0.9% IV SOLUTION: Freq: Once | INTRAVENOUS | Status: DC

## 2024-06-25 MED ORDER — IOHEXOL 300 MG/ML  SOLN
100.0000 mL | Freq: Once | INTRAMUSCULAR | Status: AC | PRN
  Administered 2024-06-25: 25 mL via INTRAVENOUS

## 2024-06-25 MED ORDER — NALOXONE HCL 0.4 MG/ML IJ SOLN: 0.4000 mg | INTRAMUSCULAR | Status: DC | PRN

## 2024-06-25 MED ORDER — ONDANSETRON HCL 4 MG/2ML IJ SOLN
INTRAMUSCULAR | Status: AC | PRN
  Administered 2024-06-25: 4 mg via INTRAVENOUS

## 2024-06-25 MED ORDER — SODIUM CHLORIDE 0.9 % IV SOLN: INTRAVENOUS | Status: AC

## 2024-06-25 MED ORDER — FENTANYL CITRATE (PF) 100 MCG/2ML IJ SOLN
INTRAMUSCULAR | Status: AC | PRN
  Administered 2024-06-25 (×3): 25 ug via INTRAVENOUS

## 2024-06-25 MED ORDER — FENTANYL CITRATE (PF) 100 MCG/2ML IJ SOLN
INTRAMUSCULAR | Status: AC
  Filled 2024-06-25: qty 2

## 2024-06-25 MED ORDER — MIDAZOLAM HCL 2 MG/2ML IJ SOLN
INTRAMUSCULAR | Status: AC | PRN
  Administered 2024-06-25 (×3): .5 mg via INTRAVENOUS

## 2024-06-25 MED ORDER — LIDOCAINE-EPINEPHRINE 1 %-1:100000 IJ SOLN
20.0000 mL | Freq: Once | INTRAMUSCULAR | Status: AC
  Administered 2024-06-25: 10 mL
  Filled 2024-06-25: qty 20

## 2024-06-25 MED ORDER — MIDAZOLAM HCL 2 MG/2ML IJ SOLN
INTRAMUSCULAR | Status: AC
  Filled 2024-06-25: qty 2

## 2024-06-25 MED ORDER — FENTANYL CITRATE PF 50 MCG/ML IJ SOSY
12.5000 ug | PREFILLED_SYRINGE | Freq: Once | INTRAMUSCULAR | Status: AC | PRN
  Administered 2024-06-25: 12.5 ug via INTRAVENOUS
  Filled 2024-06-25: qty 1

## 2024-06-25 NOTE — Progress Notes (Incomplete)
 Overnight floor coverage  Notified by RN that telemetry reported junctional rhythm.  Patient is not endorsing any chest pain, dizziness, shortness of breath.  Heart rate in the 50s.  Blood pressure 122/48.  Diastolic blood pressure was low during dayshift as well.  Stat EKG ordered.

## 2024-06-25 NOTE — TOC Progression Note (Signed)
 Transition of Care [facility]) - Progression Note    Patient Details  Name: Christina Castaneda MRN: [id number] Date of Birth: 07/12/1956  Transition of Care [facility]) CM/SW Contact  Inocente GORMAN Kindle, LCSW Phone Number: 06/25/2024, 8:50 AM  Clinical Narrative:    CSW provided update to [facility]. Will inquire if patient will need a CPAP ordered by them. Therapist, occupational for [place] is only effective until 7/5 at midnight.    Expected Discharge Plan: Skilled Nursing Facility Barriers to Discharge: Continued Medical Work up, Other (must enter comment) (SNF auth pending)  Expected Discharge Plan and Services In-house Referral: Clinical Social Work Discharge Planning Services: CM Consult Post Acute Care Choice: Skilled Nursing Facility Living arrangements for the past 2 months: Single Family Home                                       Social Determinants of Health (SDOH) Interventions SDOH Screenings   Food Insecurity: No Food Insecurity (06/08/2024)  Housing: Low Risk  (06/08/2024)  Transportation Needs: No Transportation Needs (06/08/2024)  Utilities: Not At Risk (06/08/2024)  Social Connections: Unknown (06/08/2024)  Recent Concern: Social Connections - Moderately Isolated (05/27/2024)  Tobacco Use: High Risk (06/16/2024)    Readmission Risk Interventions    05/27/2024   10:01 PM  Readmission Risk Prevention Plan  Post Dischage Appt Complete  Medication Screening Complete  Transportation Screening Complete

## 2024-06-25 NOTE — Progress Notes (Signed)
 PROGRESS NOTE        PATIENT DETAILS Name: Christina Castaneda Age: 68 y.o. Sex: female Date of Birth: 10/22/1956 Admit Date: 06/07/2024 Admitting Physician Orlin Fairly, MD ERE:Floopd, Mariano SQUIBB, DO  Brief Summary: Patient is a 68 y.o.  female with history of perforated sigmoid colon-s/p Hartman's/colostomy 2017-who initially presented to [facility] following a mechanical fall-she was found to have left distal femur fracture-underwent retrograde femoral nail on 6/20, unfortunately-further hospital course complicated by perforated duodenal ulcer with acute blood loss anemia resulting in hypovolemic shock-she was evaluated by general surgery and underwent emergent exploratory laparotomy with Graham's patch-postoperatively-she was transferred to [facility] ICU.  She was stabilized and then subsequently transferred to TRH service.  On 7/2-she was noted to have black tarry stools in her colostomy-with a hemoglobin of of 6.1.  See below for further details.  Significant events: 6/15>> mechanical fall-left femur fracture/AKI-admit to [facility] at [facility]. 6/20>> retrograde femoral nail by orthopedics at [facility] 6/23>> perforated duodenal ulcer-hypotension-exploratory laparotomy/placement of Arlyss supplies.  Remained intubated postprocedure-transferred to [facility] ICU. 6/24>> extubated 6/25>> off pressors-out of ICU-drop in hemoglobin but nonbloody ostomy output. 6/26>> transferred to TRH service 7/02>> Black tarry stools in ostomy-Hb down to 6.1-General Surgery reconsulted-too early for EGD-CT angio negative for bleed-IR deferred embolization 7/03 >> continues to have black tarry stools-IR reconsulted for embolization.  Significant studies: 6/15>> CT left knee: Acute comminuted fracture of distal left femoral shaft. 6/16>> renal ultrasound: No hydronephrosis 6/23>> CT angiogram abdomen: Perforated and bleeding peptic ulcer disease affecting proximal duodenum-appears to be hematoma in the gastric  antrum/proximal duodenum. 6/26>> upper GI series with KUB: No evidence of contrast leak. 7/02>> CT angio GI bleed study: No active bleeding.  Significant microbiology data: 6/23>> blood culture: No growth 6/23>> peritoneal fluid: Abundant Candida albicans/moderate Candida glabrata.  Procedures: 6/20>> retrograde femoral nail by orthopedics at [facility] 6/23>> exploratory laparotomy with omental Graham's patch repair  Consults: GI at [facility] General Surgery at [facility] IR  Subjective: Continues to have black starry stools overnight.  Objective: Vitals: Blood pressure (!) 103/46, pulse (!) 57, temperature 98.4 F (36.9 C), temperature source Oral, resp. rate 15, height 5' 5 (1.651 m), weight 117.6 kg, last menstrual period 11/18/2012, SpO2 94%.   Exam: Gen Exam:Alert awake-not in any distress HEENT:atraumatic, normocephalic Chest: B/L clear to auscultation anteriorly CVS:S1S2 regular Abdomen:soft non tender, non distended Extremities:no edema Neurology: Non focal Skin: no rash  Pertinent Labs/Radiology:    Latest Ref Rng & Units 06/25/2024    3:55 AM 06/24/2024    6:58 PM 06/24/2024    4:04 AM  CBC  WBC 4.0 - 10.5 K/uL 8.0  7.8  8.2   Hemoglobin 12.0 - 15.0 g/dL 7.2  8.4  6.1   Hematocrit 36.0 - 46.0 % 21.9  25.7  19.7   Platelets 150 - 400 K/uL 181  184  165     Lab Results  Component Value Date   NA 133 (L) 06/25/2024   K 3.8 06/25/2024   CL 100 06/25/2024   CO2 26 06/25/2024     Assessment/Plan: Hemorrhagic and septic shock secondary to perforated bleeding duodenal ulcer with acute blood loss anemia-s/p exploratory laparotomy with Arlyss patch repair on 6/23 at [facility] Had stabilized-unfortunately developed melanotic appearing stools on 7/2-given 2 units of PRBC-CT angio negative for active extravasation-IR deferred embolization.  However on 7/3  continues to have black tarry stools-Hb downtrending with elevated BUN.  Per general surgery-not safe for endoscopy due to  recent surgery-discussed with both IR/general surgery team-IR reconsulted for embolization.  In the interim-transfuse 1 additional unit of PRBC and follow CBC closely. Continue IV PPI.  Mechanical fall with left femur fracture-s/p retrograde left femoral nail on 6/20 Per orthopedics-WBAT LLE, knee immobilizer when ambulating, remove staples in 2 weeks.  Follow-up with Dr. Oneil Salvage at [place] 2 weeks from discharge.  AKI Likely hemodynamically mediated Resolved  Acute urinary retention Foley catheter remains in place-given plans for potential embolization-will keep in place for now-voiding trial when she is a bit more stable.  HTN BP soft-continue to hold all antihypertensives  HLD Continue to hold Crestor -resume when able-currently n.p.o.  COPD Stable-no wheezing Continue bronchodilators Stop further steroids at this point.  OSA CPAP nightly  Hypothyroidism Synthroid   History of perforated sigmoid colon-s/p Hartman/colostomy 2017  Debility/deconditioning PT/OT eval-SNF recommended.  Nutrition Status: Nutrition Problem: Increased nutrient needs Etiology: acute illness, wound healing Signs/Symptoms: estimated needs Interventions: Refer to RD note for recommendations   Pressure Ulcer: Agree with assessment and plan as outlined below. Pressure Injury 05/27/24 Sacrum Stage 2 -  Partial thickness loss of dermis presenting as a shallow open injury with a red, pink wound bed without slough. 4 small stage II present, each measures 0.4x 0.4 x 0.2 (Active)  05/27/24 1815  Location: Sacrum  Location Orientation:   Staging: Stage 2 -  Partial thickness loss of dermis presenting as a shallow open injury with a red, pink wound bed without slough.  Wound Description (Comments): 4 small stage II present, each measures 0.4x 0.4 x 0.2  DO NOT USE:  Present on Admission:   Dressing Type Foam - Lift dressing to assess site every shift 06/24/24 9167    Morbid  Obesity: Estimated body mass index is 43.14 kg/m as calculated from the following:   Height as of this encounter: 5' 5 (1.651 m).   Weight as of this encounter: 117.6 kg.   Code status:   Code Status: Full Code   DVT Prophylaxis: Place and maintain sequential compression device Start: 06/18/24 1325 SCDs Start: 06/15/24 1937 Place and maintain sequential compression device Start: 06/15/24 0155 SQ heparin  discontinued 7/2-due to high GI bleeding with ABLA.  Family Communication: Spouse Randall-[phone]-updated 7/3   Disposition Plan: Status is: Inpatient Remains inpatient appropriate because: Severity of illness   Planned Discharge Destination:Skilled nursing facility   Diet: Diet Order             Diet NPO time specified  Diet effective now                     Antimicrobial agents: Anti-infectives (From admission, onward)    Start     Dose/Rate Route Frequency Ordered Stop   06/16/24 1800  vancomycin  (VANCOCIN ) IVPB 1000 mg/200 mL premix  Status:  Discontinued        1,000 mg 200 mL/hr over 60 Minutes Intravenous Every 24 hours 06/15/24 1317 06/15/24 1328   06/15/24 1700  vancomycin  (VANCOREADY) IVPB 2000 mg/400 mL  Status:  Discontinued        2,000 mg 200 mL/hr over 120 Minutes Intravenous  Once 06/15/24 1317 06/15/24 1328   06/15/24 1600  fluconazole  (DIFLUCAN ) IVPB 400 mg  Status:  Discontinued        400 mg 100 mL/hr over 120 Minutes Intravenous Every 24 hours 06/15/24 1317 06/16/24 0914   06/15/24 1500  metroNIDAZOLE  (FLAGYL ) IVPB 500 mg  Status:  Discontinued        500 mg 100 mL/hr over 60 Minutes Intravenous Every 12 hours 06/15/24 1216 06/15/24 1255   06/15/24 1400  ciprofloxacin  (CIPRO ) IVPB 400 mg  Status:  Discontinued        400 mg 200 mL/hr over 60 Minutes Intravenous Every 12 hours 06/15/24 1216 06/15/24 1255   06/15/24 1400  piperacillin -tazobactam (ZOSYN ) IVPB 3.375 g        3.375 g 12.5 mL/hr over 240 Minutes Intravenous Every 8 hours  06/15/24 1317 06/20/24 2359   06/15/24 1400  vancomycin  (VANCOCIN ) IVPB 1000 mg/200 mL premix  Status:  Discontinued        1,000 mg 200 mL/hr over 60 Minutes Intravenous Every 24 hours 06/15/24 1328 06/16/24 0914   06/15/24 1333  vancomycin  (VANCOCIN ) 1-5 GM/200ML-% IVPB       Note to Pharmacy: Romona Shaver E: cabinet override      06/15/24 1333 06/15/24 1410   06/15/24 1221  ciprofloxacin  (CIPRO ) 400 MG/200ML IVPB  Status:  Discontinued       Note to Pharmacy: Dannielle Railing S: cabinet override      06/15/24 1221 06/15/24 1343   06/15/24 1221  metroNIDAZOLE  (FLAGYL ) 500 MG/100ML IVPB  Status:  Discontinued       Note to Pharmacy: Dannielle Railing S: cabinet override      06/15/24 1221 06/15/24 1343   06/12/24 1223  clindamycin  (CLEOCIN ) 900 MG/50ML IVPB       Note to Pharmacy: Elaine Nest L: cabinet override      06/12/24 1223 06/12/24 1332   06/12/24 1131  ceFAZolin  (ANCEF ) 2-4 GM/100ML-% IVPB  Status:  Discontinued       Note to Pharmacy: Billy Sluder H: cabinet override      06/12/24 1131 06/12/24 1434   06/11/24 1300  clindamycin  (CLEOCIN ) IVPB 900 mg  Status:  Discontinued        900 mg 100 mL/hr over 30 Minutes Intravenous On call to O.R. 06/10/24 1041 06/10/24 1447   06/11/24 1300  ceFAZolin  (ANCEF ) IVPB 2g/100 mL premix        2 g 200 mL/hr over 30 Minutes Intravenous On call to O.R. 06/10/24 1447 06/12/24 1908        MEDICATIONS: Scheduled Meds:  sodium chloride    Intravenous Once   sodium chloride    Intravenous Once   sodium chloride    Intravenous Once   arformoterol   15 mcg Nebulization Q12H   budesonide  (PULMICORT ) nebulizer solution  0.5 mg Nebulization BID   Chlorhexidine  Gluconate Cloth  6 each Topical Daily   feeding supplement  237 mL Oral BID BM   levothyroxine   150 mcg Oral Q0600   multivitamin with minerals  1 tablet Oral Daily   nutrition supplement (JUVEN)  1 packet Oral BID AC & HS   nystatin    Topical BID   pantoprazole  (PROTONIX ) IV   40 mg Intravenous Q12H   sodium chloride  flush  10-40 mL Intracatheter Q12H   sodium chloride  flush  3 mL Intravenous Q12H   sodium hypochlorite   Irrigation BID   Continuous Infusions: PRN Meds:.dextrose , iohexol , ipratropium-albuterol , magic mouthwash w/lidocaine , [DISCONTINUED] ondansetron  **OR** ondansetron  (ZOFRAN ) IV, mouth rinse, oxyCODONE , polyethylene glycol, prochlorperazine , sodium chloride  flush, sodium chloride  flush   I have personally reviewed following labs and imaging studies  LABORATORY DATA: CBC: Recent Labs  Lab 06/22/24 0459 06/23/24 0408 06/24/24 0404 06/24/24 1858 06/25/24 0355  WBC 6.2 7.1 8.2 7.8 8.0  HGB 8.1* 8.7* 6.1* 8.4* 7.2*  HCT 25.7* 27.8* 19.7* 25.7* 21.9*  MCV 102.0* 100.4* 102.6* 95.2 96.1  PLT PLATELET CLUMPS NOTED ON SMEAR, UNABLE TO ESTIMATE 112* 165 184 181    Basic Metabolic Panel: Recent Labs  Lab 06/19/24 0405 06/20/24 0428 06/21/24 0420 06/22/24 0459 06/23/24 0408 06/24/24 1858 06/25/24 0355  NA 144 141 137 134* 132* 133* 133*  K 4.1 4.4 4.6 4.3 4.2 4.1 3.8  CL 110 108 102 102 101 100 100  CO2 28 27 26 27 26 25 26   GLUCOSE 85 94 88 84 83 97 90  BUN 26* 20 26* 26* 32* 49* 52*  CREATININE 1.14* 1.08* 1.03* 1.02* 0.95 1.10* 1.05*  CALCIUM  7.9* 7.7* 7.9* 8.0* 8.1* 7.8* 8.2*  MG 1.7 2.1 1.9 1.9  --   --   --   PHOS 2.4* 2.9 3.1 3.1  --   --   --     GFR: Estimated Creatinine Clearance: 63.9 mL/min (A) (by C-G formula based on SCr of 1.05 mg/dL (H)).  Liver Function Tests: Recent Labs  Lab 06/19/24 0405 06/22/24 0459  AST 64* 35  ALT 29 22  ALKPHOS 40 42  BILITOT 0.6 0.6  PROT 4.0* 4.5*  ALBUMIN <1.5* <1.5*   No results for input(s): LIPASE, AMYLASE in the last 168 hours. No results for input(s): AMMONIA in the last 168 hours.  Coagulation Profile: No results for input(s): INR, PROTIME in the last 168 hours.  Cardiac Enzymes: No results for input(s): CKTOTAL, CKMB, CKMBINDEX, TROPONINI in the  last 168 hours.  BNP (last 3 results) No results for input(s): PROBNP in the last 8760 hours.  Lipid Profile: No results for input(s): CHOL, HDL, LDLCALC, TRIG, CHOLHDL, LDLDIRECT in the last 72 hours.   Thyroid  Function Tests: No results for input(s): TSH, T4TOTAL, FREET4, T3FREE, THYROIDAB in the last 72 hours.  Anemia Panel: No results for input(s): VITAMINB12, FOLATE, FERRITIN, TIBC, IRON, RETICCTPCT in the last 72 hours.  Urine analysis:    Component Value Date/Time   COLORURINE YELLOW 06/07/2024 2026   APPEARANCEUR HAZY (A) 06/07/2024 2026   LABSPEC 1.015 06/07/2024 2026   PHURINE 5.0 06/07/2024 2026   GLUCOSEU NEGATIVE 06/07/2024 2026   HGBUR NEGATIVE 06/07/2024 2026   BILIRUBINUR NEGATIVE 06/07/2024 2026   KETONESUR NEGATIVE 06/07/2024 2026   PROTEINUR NEGATIVE 06/07/2024 2026   NITRITE NEGATIVE 06/07/2024 2026   LEUKOCYTESUR NEGATIVE 06/07/2024 2026    Sepsis Labs: Lactic Acid, Venous    Component Value Date/Time   LATICACIDVEN 1.0 05/27/2024 1945    MICROBIOLOGY: Recent Results (from the past 240 hours)  Aerobic/Anaerobic Culture w Gram Stain (surgical/deep wound)     Status: None   Collection Time: 06/15/24  3:24 PM   Specimen: Path fluid; Body Fluid  Result Value Ref Range Status   Specimen Description   Final    PERITONEAL Performed at [facility], [address] [place], [place] [address]    Special Requests   Final    NONE Performed at [facility], [address], [place], [place] [address]    Gram Stain   Final    RARE WBC PRESENT, PREDOMINANTLY PMN NO ORGANISMS SEEN    Culture   Final    ABUNDANT CANDIDA ALBICANS MODERATE CANDIDA GLABRATA NO ANAEROBES ISOLATED Performed at [facility], [address] [place], [place] [address]    Report Status 06/20/2024 FINAL  Final  Culture, blood (Routine X 2) w Reflex to ID Panel  Status: None   Collection Time: 06/15/24  5:13 PM   Specimen: BLOOD   Result Value Ref Range Status   Specimen Description BLOOD LEFT ANTECUBITAL  Final   Special Requests   Final    BOTTLES DRAWN AEROBIC AND ANAEROBIC Blood Culture adequate volume   Culture   Final    NO GROWTH 5 DAYS Performed at [facility], [address] [place], [place] [address]    Report Status 06/20/2024 FINAL  Final  Culture, blood (Routine X 2) w Reflex to ID Panel     Status: None   Collection Time: 06/15/24  5:24 PM   Specimen: BLOOD  Result Value Ref Range Status   Specimen Description BLOOD LEFT ANTECUBITAL  Final   Special Requests   Final    BOTTLES DRAWN AEROBIC AND ANAEROBIC Blood Culture results may not be optimal due to an inadequate volume of blood received in culture bottles   Culture   Final    NO GROWTH 5 DAYS Performed at [facility], [address], [place], [place] [address]    Report Status 06/20/2024 FINAL  Final    RADIOLOGY STUDIES/RESULTS: CT ANGIO GI BLEED Result Date: 06/24/2024 CLINICAL DATA:  Melena with acute blood-loss and anemia. Status post exploratory laparotomy and Arlyss patch placement for duodenitis and duodenal ulcer with perforation on 06/15/2024. Clinical concern for determining if there is active gastrointestinal bleeding. EXAM: CTA ABDOMEN AND PELVIS WITHOUT AND WITH CONTRAST TECHNIQUE: Multidetector CT imaging of the abdomen and pelvis was performed using the standard protocol during bolus administration of intravenous contrast. Multiplanar reconstructed images and MIPs were obtained and reviewed to evaluate the vascular anatomy. RADIATION DOSE REDUCTION: This exam was performed according to the departmental dose-optimization program which includes automated exposure control, adjustment of the mA and/or kV according to patient size and/or use of iterative reconstruction technique. CONTRAST:  OMNIPAQUE  IOHEXOL  350 MG/ML SOLN COMPARISON:  Abdomen and pelvis CT dated 06/15/2024. FINDINGS: VASCULAR Aorta: Atheromatous changes without  stenosis, aneurysm or dissection. Celiac: Patent without evidence of aneurysm, dissection, vasculitis or significant stenosis. SMA: Patent without evidence of aneurysm, dissection, vasculitis or significant stenosis. Renals: 2 right renal arteries and 1 left renal artery. There is some plaque formation in the proximal, upper right renal artery producing approximately 50% stenosis. No plaque formation or stenosis involving the more inferior right renal artery. Calcified plaque at the origin of the left renal artery with a proximally 70% stenosis. IMA: Calcified plaque throughout the inferior aspect of the abdominal aorta without opacification of the inferior mesenteric artery. Inflow: Bilateral calcified plaque formation without aneurysm or significant stenosis. Proximal Outflow: Bilateral calcified plaque formation without aneurysm or significant stenosis. Veins: No obvious venous abnormality within the limitations of this arterial phase study. Review of the MIP images confirms the above findings. NON-VASCULAR Lower chest: Moderate-sized right pleural effusion and small to moderate-sized left pleural effusion. Associated bilateral lower lobe compressive atelectasis. The included portion of the heart is mildly enlarged with no pericardial effusion seen. Hepatobiliary: No focal liver abnormality is seen. Status post cholecystectomy. No biliary dilatation. Pancreas: Unremarkable. No pancreatic ductal dilatation or surrounding inflammatory changes. Spleen: Normal in size without focal abnormality. Adrenals/Urinary Tract: Foley catheter in the urinary bladder. Otherwise, unremarkable bladder and adrenal glands. Simple appearing exophytic cyst arising from the left kidney, not needing imaging follow-up. 7 mm calculus in the upper left renal pelvis. Interval minimal dilatation of the left renal collecting system to the level of the ureteropelvic junction with no obstructing  stone or mass seen. No ureteral dilatation.  Stomach/Bowel: Interval diffuse low-density gastric wall thickening with mucosal enhancement, most pronounced involving the gastric antrum. Increased high density material in the 2nd portion of the duodenum on the initial postcontrast images, unchanged on the postcontrast images. No active contrast extravasation seen in the stomach or bowel. Status post partial colectomy including the proximal sigmoid colon. Enteric contrast in the excluded distal sigmoid colon and rectum, unchanged on the precontrast and postcontrast images. Multiple sigmoid colon diverticula in that region without evidence of diverticulitis. Unremarkable small bowel. Surgically absent appendix. 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 Inferior to the ostomy, large ventral hernia containing herniated stomach, colon and small bowel is again demonstrated. No evidence of obstruction. Lymphatic: No enlarged lymph nodes. Reproductive: Status post hysterectomy. No adnexal masses. Other: Stable large ventral hernia containing herniated stomach, small bowel and colon without obstruction. An upper abdominal surgical drain is in place with no adjacent fluid. Musculoskeletal: Extensive lumbar and thoracic spine degenerative changes. IMPRESSION: 1. No active contrast extravasation seen in the stomach or bowel. 2. Interval diffuse low-density gastric wall thickening with mucosal enhancement, most pronounced involving the gastric antrum. This is consistent with gastritis. 3. Moderate-sized right pleural effusion and small to moderate-sized left pleural effusion with associated bilateral lower lobe compressive atelectasis. 4. Mild cardiomegaly. 5. 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 6. 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 7. Interval minimal dilatation of the left renal collecting system to the level of the ureteropelvic junction with no obstructing stone or  mass seen. This could be due to a recently passed stone or a UPJ obstruction. 8. Stable large ventral hernia containing herniated stomach, small bowel and colon without obstruction. 9. Status post partial colectomy including the proximal sigmoid colon. Enteric contrast in the excluded distal sigmoid colon and rectum, unchanged on the precontrast and postcontrast images. Multiple sigmoid colon diverticula in that region without evidence of diverticulitis. 10. 7 mm nonobstructing calculus in the upper left renal pelvis. 11. Aortic atherosclerosis. Aortic Atherosclerosis (ICD10-I70.0). Electronically Signed   By: Elspeth Bathe M.D.   On: 06/24/2024 15:00     LOS: 18 days   Donalda Applebaum, MD  [facility]    To contact the attending provider between 7A-7P or the covering provider during after hours 7P-7A, please log into the web site [email or web address] and access using universal [place] password for that web site. If you do not have the password, please call the hospital operator.  06/25/2024, 11:11 AM

## 2024-06-25 NOTE — Progress Notes (Signed)
 Attempted to see patient twice today but she is down in IR getting her GDA embolized due to persistent bleeding.  I have discussed her with primary service.  We will see her tomorrow and continue to follow.  Burnard FORBES Banter 3:51 PM 06/25/2024

## 2024-06-25 NOTE — Sedation Documentation (Signed)
 Patient transported to 5W with this RN. Right femoral site assessed with [place], RN. Site level 0, soft, dressing clean and intact, no hematoma

## 2024-06-25 NOTE — Procedures (Signed)
 Vascular and Interventional Radiology Procedure Note  Patient: LUNDON ROSIER DOB: 08/08/1956 Medical Record Number: [id number] Note Date/Time: 06/25/24 2:07 PM   Performing Physician: Thom Hall, MD Assistant(s): None  Diagnosis: UGIB  Procedure:  MESENTERIC ARTERIOGRAPHY COIL EMBOLIZATION of GASTRODUODENAL ARTERY  Anesthesia: Conscious Sedation Complications: None Estimated Blood Loss: Minimal Specimens: None  Findings:  - access via the RIGHT femoral artery. - mesenteric arteriography, including celiac and GDA arteriography - coil embolization of GDA - No obvious abnormality was identified at this time.  Final report to follow once all images are reviewed and compared with previous studies.  See detailed dictation with images in PACS. The patient tolerated the procedure well without incident or complication and was returned to Recovery in stable condition.    Thom Hall, MD [facility]   Pager. [phone] Clinic. [phone]

## 2024-06-25 NOTE — Progress Notes (Signed)
 OT Cancellation Note  Patient Details Name: BRYN SALINE MRN: [id number] DOB: 07-Mar-1956   Cancelled Treatment:    Reason Eval/Treat Not Completed: Patient at procedure or test/ unavailable  Kennth Mliss Helling 06/25/2024, 2:40 PM Mliss HERO, OTR/L Acute Rehabilitation Services Office: [phone]

## 2024-06-25 NOTE — Consult Note (Addendum)
 Chief Complaint: GI bleed - unknown source  Referring Provider(s): Ghimire  Supervising Physician: Hughes Simmonds  Patient Status: [facility] - In-pt  History of Present Illness: Christina Castaneda is a 68 y.o. female with history of perforated sigmoid colon-s/p Hartman's/colostomy 2017.  She presented to [facility] after a fall and was found to have left distal femur fracture.  She underwent retrograde femoral nail on 06/12/2024.  She  then developed a perforated duodenal ulcer with acute blood loss anemia resulting in hypovolemic shock.  She is now post op day 9 from emergent exploratory laparotomy with Graham's patch.  On 7/2-she was noted to have black tarry stools in her colostomy with a hemoglobin of of 6.1.   CTA did not show active bleeding.  She continues to have a drop in her hemoglobin and is currently receiving PRBCs.  IR is asked to evaluate her for empiric GDA embolization.  She is NPO.   Patient is Full Code  Past Medical History:  Diagnosis Date   Asthma    mild   Atypical endometrial hyperplasia    COPD (chronic obstructive pulmonary disease) (HCC)    Goiter    Gout    Heart murmur    Hypertension    Hypothyroidism    Vitamin B12 deficiency    Yeast infection    for last 3 weeks    Past Surgical History:  Procedure Laterality Date   ABDOMINAL WALL DEFECT REPAIR N/A 04/18/2016   Procedure: CLOSURE OF ABDOMEN;  Surgeon: Debby Shipper, MD;  Location: [facility] OR;  Service: General;  Laterality: N/A;   APPENDECTOMY     APPLICATION OF WOUND VAC N/A 04/02/2016   Procedure: application of wound vac+;  Surgeon: Camellia Blush, MD;  Location: [facility] OR;  Service: General;  Laterality: N/A;   CHOLECYSTECTOMY  2009   gallstone removed   COLOSTOMY N/A 03/30/2016   Procedure: COLOSTOMY;  Surgeon: Donnice Bury, MD;  Location: [facility] OR;  Service: General;  Laterality: N/A;   COLOSTOMY REVISION N/A 03/28/2016   Procedure: COLOSTOMY REVISION;  Surgeon: Lynwood Pina, MD;  Location: [facility]  OR;  Service: General;  Laterality: N/A;   COLOSTOMY REVISION N/A 03/30/2016   Procedure: COLON RESECTION LEFT;  Surgeon: Donnice Bury, MD;  Location: [facility] OR;  Service: General;  Laterality: N/A;   FEMUR IM NAIL Left 06/12/2024   Procedure: INSERTION, INTRAMEDULLARY ROD, FEMUR, RETROGRADE;  Surgeon: Onesimo Oneil LABOR, MD;  Location: [facility] ORS;  Service: Orthopedics;  Laterality: Left;   LAPAROTOMY N/A 03/28/2016   Procedure: EXPLORATORY LAPAROTOMY;  Surgeon: Lynwood Pina, MD;  Location: [facility] OR;  Service: General;  Laterality: N/A;   LAPAROTOMY N/A 04/02/2016   Procedure: Re-exploration of open abdomen, application of abdominal wound vac;  Surgeon: Camellia Blush, MD;  Location: [facility] OR;  Service: General;  Laterality: N/A;   LAPAROTOMY N/A 04/04/2016   Procedure: EXPLORATORY LAPAROTOMY, PLACEMENT OF ABRA ABDOMINAL WALL CLOSURE SET ;  Surgeon: Camellia Blush, MD;  Location: [facility] OR;  Service: General;  Laterality: N/A;   LAPAROTOMY N/A 04/18/2016   Procedure: EXPLORATORY LAPAROTOMY;  Surgeon: Debby Shipper, MD;  Location: [facility] OR;  Service: General;  Laterality: N/A;   LAPAROTOMY N/A 06/15/2024   Procedure: LAPAROTOMY, EXPLORATORY OMENTAL GRAHAM PATCH REPAIR.;  Surgeon: Evonnie Dorothyann LABOR, DO;  Location: [facility] ORS;  Service: General;  Laterality: N/A;   LYSIS OF ADHESION N/A 06/15/2024   Procedure: LAPAROTOMY, FOR LYSIS OF ADHESIONS;  Surgeon: Evonnie Dorothyann LABOR, DO;  Location: [facility] ORS;  Service: General;  Laterality: N/A;   NECK SURGERY  1994   repair disk   OMENTECTOMY N/A 03/28/2016   Procedure: OMENTECTOMY;  Surgeon: Lynwood Pina, MD;  Location: [facility] OR;  Service: General;  Laterality: N/A;   ROBOTIC ASSISTED TOTAL HYSTERECTOMY WITH BILATERAL SALPINGO OOPHERECTOMY  11/18/2012   Procedure: ROBOTIC ASSISTED TOTAL HYSTERECTOMY WITH BILATERAL SALPINGO OOPHORECTOMY;  Surgeon: Elenore LABOR. Dodie, MD;  Location: [facility] ORS;  Service: Gynecology;  Laterality: N/A;   TONSILLECTOMY     as a child   VACUUM ASSISTED CLOSURE CHANGE  N/A 03/30/2016   Procedure: ABDOMINAL VAC CHANGE;  Surgeon: Donnice Bury, MD;  Location: [facility] OR;  Service: General;  Laterality: N/A;   WOUND DEBRIDEMENT N/A 03/28/2016   Procedure: DEBRIDEMENT WOUND;  Surgeon: Lynwood Pina, MD;  Location: [facility] OR;  Service: General;  Laterality: N/A;    Allergies: Penicillins, Biphosphate, and Fluticasone   Medications: Prior to Admission medications   Medication Sig Start Date End Date Taking? Authorizing Provider  acetaminophen  (TYLENOL ) 500 MG tablet Take 1,000 mg by mouth every 6 (six) hours.   Yes [provider]  albuterol  (VENTOLIN  HFA) 108 (90 Base) MCG/ACT inhaler Inhale 2 puffs into the lungs every 6 (six) hours as needed. Wheezing and shortness of breath 05/31/24 07/30/24 Yes Shahmehdi, Seyed A, MD  allopurinol  (ZYLOPRIM ) 300 MG tablet Take 300 mg by mouth daily.   Yes [provider]  Cyanocobalamin (VITAMIN B-12 CR PO) Take 1 tablet by mouth daily.   Yes [provider]  Ergocalciferol  50 MCG (2000 UT) TABS Take 2,000 Units by mouth daily.   Yes [provider]  fluticasone -salmeterol (ADVAIR) 500-50 MCG/ACT AEPB Inhale 1 puff into the lungs in the morning and at bedtime. 05/31/24 06/30/24 Yes Shahmehdi, Seyed A, MD  gabapentin  (NEURONTIN ) 300 MG capsule Take 1-2 capsules by mouth at bedtime. 02/27/21  Yes [provider]  levothyroxine  (SYNTHROID ) 137 MCG tablet Take 137 mcg by mouth daily.   Yes [provider]  nystatin  (MYCOSTATIN /NYSTOP ) powder Apply topically 3 (three) times daily. 05/31/24  Yes Shahmehdi, Seyed A, MD  rosuvastatin  (CRESTOR ) 10 MG tablet Take 10 mg by mouth daily.   Yes [provider]  senna-docusate (SENOKOT-S) 8.6-50 MG tablet Take 2 tablets by mouth at bedtime. 05/31/24 06/30/24 Yes Shahmehdi, Adriana LABOR, MD     Family History  Problem Relation Age of Onset   COPD Mother    Diabetes Mother    Congestive Heart Failure Mother    Diabetes Father    Heart attack Father     Colon cancer Neg Hx    Colon polyps Neg Hx     Social History   Socioeconomic History   Marital status: Married    Spouse name: Not on file   Number of children: Not on file   Years of education: Not on file   Highest education level: Not on file  Occupational History   Not on file  Tobacco Use   Smoking status: Every Day    Current packs/day: 1.50    Average packs/day: 1.5 packs/day for 28.0 years (42.0 ttl pk-yrs)    Types: Cigarettes   Smokeless tobacco: Never  Vaping Use   Vaping status: Never Used  Substance and Sexual Activity   Alcohol use: Yes    Alcohol/week: 1.0 - 3.0 standard drink of alcohol    Types: 1 - 3 Standard drinks or equivalent per week    Comment: 1-2 beers a week.   Drug use: No   Sexual activity: Yes  Partners: Male    Birth control/protection: Surgical    Comment: Hysterectomy  Other Topics Concern   Not on file  Social History Narrative   Not on file   Social Drivers of Health   Financial Resource Strain: Not on file  Food Insecurity: No Food Insecurity (06/08/2024)   Hunger Vital Sign    Worried About Running Out of Food in the Last Year: Never true    Ran Out of Food in the Last Year: Never true  Transportation Needs: No Transportation Needs (06/08/2024)   PRAPARE - Administrator, Civil Service (Medical): No    Lack of Transportation (Non-Medical): No  Physical Activity: Not on file  Stress: Not on file  Social Connections: Unknown (06/08/2024)   Social Connection and Isolation Panel    Frequency of Communication with Friends and Family: Not on file    Frequency of Social Gatherings with Friends and Family: Once a week    Attends Religious Services: 1 to 4 times per year    Active Member of [employer] or Organizations: No    Attends Banker Meetings: Never    Marital Status: Married  Recent Concern: Social Connections - Moderately Isolated (05/27/2024)   Social Connection and Isolation Panel    Frequency of  Communication with Friends and Family: Once a week    Frequency of Social Gatherings with Friends and Family: Once a week    Attends Religious Services: 1 to 4 times per year    Active Member of [employer] or Organizations: No    Attends Banker Meetings: Never    Marital Status: Married     Review of Systems: A 12 point ROS discussed and pertinent positives are indicated in the HPI above.  All other systems are negative.    Vital Signs: BP 114/63   Pulse (!) 58   Temp 98.4 F (36.9 C) (Oral)   Resp 16   Ht 5' 5 (1.651 m)   Wt 259 lb 4.2 oz (117.6 kg)   LMP 11/18/2012   SpO2 93%   BMI 43.14 kg/m   Advance Care Plan: The advanced care place/surrogate decision maker was discussed at the time of visit and the patient did not wish to discuss or was not able to name a surrogate decision maker or provide an advance care plan.  Physical Exam Vitals reviewed.  Constitutional:      Appearance: She is obese.  HENT:     Head: Normocephalic and atraumatic.  Eyes:     Extraocular Movements: Extraocular movements intact.  Cardiovascular:     Rate and Rhythm: Normal rate and regular rhythm.     Heart sounds: Murmur heard.  Pulmonary:     Effort: Pulmonary effort is normal. No respiratory distress.     Breath sounds: Normal breath sounds.  Abdominal:     Palpations: Abdomen is soft.     Comments: Black stool in ostomy. LUQ surgical drain in place.  Musculoskeletal:     Cervical back: Normal range of motion.  Skin:    General: Skin is warm and dry.  Neurological:     General: No focal deficit present.     Mental Status: She is alert and oriented to person, place, and time.  Psychiatric:        Mood and Affect: Mood normal.        Behavior: Behavior normal.        Thought Content: Thought content normal.  Judgment: Judgment normal.     Imaging: CLINICAL DATA:  Melena with acute blood-loss and anemia. Status post exploratory laparotomy and Arlyss patch  placement for duodenitis and duodenal ulcer with perforation on 06/15/2024. Clinical concern for determining if there is active gastrointestinal bleeding.   EXAM: CTA ABDOMEN AND PELVIS WITHOUT AND WITH CONTRAST   TECHNIQUE: Multidetector CT imaging of the abdomen and pelvis was performed using the standard protocol during bolus administration of intravenous contrast. Multiplanar reconstructed images and MIPs were obtained and reviewed to evaluate the vascular anatomy.   RADIATION DOSE REDUCTION: This exam was performed according to the departmental dose-optimization program which includes automated exposure control, adjustment of the mA and/or kV according to patient size and/or use of iterative reconstruction technique.   CONTRAST:  OMNIPAQUE  IOHEXOL  350 MG/ML SOLN   COMPARISON:  Abdomen and pelvis CT dated 06/15/2024.   FINDINGS: VASCULAR   Aorta: Atheromatous changes without stenosis, aneurysm or dissection.   Celiac: Patent without evidence of aneurysm, dissection, vasculitis or significant stenosis.   SMA: Patent without evidence of aneurysm, dissection, vasculitis or significant stenosis.   Renals: 2 right renal arteries and 1 left renal artery. There is some plaque formation in the proximal, upper right renal artery producing approximately 50% stenosis. No plaque formation or stenosis involving the more inferior right renal artery. Calcified plaque at the origin of the left renal artery with a proximally 70% stenosis.   IMA: Calcified plaque throughout the inferior aspect of the abdominal aorta without opacification of the inferior mesenteric artery.   Inflow: Bilateral calcified plaque formation without aneurysm or significant stenosis.   Proximal Outflow: Bilateral calcified plaque formation without aneurysm or significant stenosis.   Veins: No obvious venous abnormality within the limitations of this arterial phase study.   Review of the MIP  images confirms the above findings.   NON-VASCULAR   Lower chest: Moderate-sized right pleural effusion and small to moderate-sized left pleural effusion. Associated bilateral lower lobe compressive atelectasis. The included portion of the heart is mildly enlarged with no pericardial effusion seen.   Hepatobiliary: No focal liver abnormality is seen. Status post cholecystectomy. No biliary dilatation.   Pancreas: Unremarkable. No pancreatic ductal dilatation or surrounding inflammatory changes.   Spleen: Normal in size without focal abnormality.   Adrenals/Urinary Tract: Foley catheter in the urinary bladder. Otherwise, unremarkable bladder and adrenal glands. Simple appearing exophytic cyst arising from the left kidney, not needing imaging follow-up. 7 mm calculus in the upper left renal pelvis. Interval minimal dilatation of the left renal collecting system to the level of the ureteropelvic junction with no obstructing stone or mass seen. No ureteral dilatation.   Stomach/Bowel: Interval diffuse low-density gastric wall thickening with mucosal enhancement, most pronounced involving the gastric antrum. Increased high density material in the 2nd portion of the duodenum on the initial postcontrast images, unchanged on the postcontrast images. No active contrast extravasation seen in the stomach or bowel. Status post partial colectomy including the proximal sigmoid colon. Enteric contrast in the excluded distal sigmoid colon and rectum, unchanged on the precontrast and postcontrast images. Multiple sigmoid colon diverticula in that region without evidence of diverticulitis. Unremarkable small bowel. Surgically absent appendix.   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 Inferior to the ostomy, large ventral hernia containing herniated stomach, colon and small bowel is again demonstrated. No evidence of obstruction.   Lymphatic: No enlarged lymph nodes.   Reproductive:  Status post hysterectomy. No adnexal masses.   Other: Stable large ventral hernia  containing herniated stomach, small bowel and colon without obstruction. An upper abdominal surgical drain is in place with no adjacent fluid.   Musculoskeletal: Extensive lumbar and thoracic spine degenerative changes.   IMPRESSION: 1. No active contrast extravasation seen in the stomach or bowel. 2. Interval diffuse low-density gastric wall thickening with mucosal enhancement, most pronounced involving the gastric antrum. This is consistent with gastritis. 3. Moderate-sized right pleural effusion and small to moderate-sized left pleural effusion with associated bilateral lower lobe compressive atelectasis. 4. Mild cardiomegaly. 5. 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 6. 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 7. Interval minimal dilatation of the left renal collecting system to the level of the ureteropelvic junction with no obstructing stone or mass seen. This could be due to a recently passed stone or a UPJ obstruction. 8. Stable large ventral hernia containing herniated stomach, small bowel and colon without obstruction. 9. Status post partial colectomy including the proximal sigmoid colon. Enteric contrast in the excluded distal sigmoid colon and rectum, unchanged on the precontrast and postcontrast images. Multiple sigmoid colon diverticula in that region without evidence of diverticulitis. 10. 7 mm nonobstructing calculus in the upper left renal pelvis. 11. Aortic atherosclerosis.   Aortic Atherosclerosis (ICD10-I70.0).     Electronically Signed   By: Elspeth Bathe M.D.   On: 06/24/2024 15:00    Labs:  CBC: Recent Labs    06/23/24 0408 06/24/24 0404 06/24/24 1858 06/25/24 0355  WBC 7.1 8.2 7.8 8.0  HGB 8.7* 6.1* 8.4* 7.2*  HCT 27.8* 19.7* 25.7* 21.9*  PLT 112* 165 184 181     COAGS: Recent Labs    05/27/24 1144 05/27/24 1744 05/28/24 0456 06/15/24 2142  INR 1.3*  --   --  1.7*  APTT  --  29 30 29     BMP: Recent Labs    06/22/24 0459 06/23/24 0408 06/24/24 1858 06/25/24 0355  NA 134* 132* 133* 133*  K 4.3 4.2 4.1 3.8  CL 102 101 100 100  CO2 27 26 25 26   GLUCOSE 84 83 97 90  BUN 26* 32* 49* 52*  CALCIUM  8.0* 8.1* 7.8* 8.2*  CREATININE 1.02* 0.95 1.10* 1.05*  GFRNONAA 59* >60 54* 57*    LIVER FUNCTION TESTS: Recent Labs    06/17/24 0425 06/18/24 0031 06/19/24 0405 06/22/24 0459  BILITOT 0.8 0.7 0.6 0.6  AST 39 52* 64* 35  ALT 23 26 29 22   ALKPHOS 33* 38 40 42  PROT 3.7* 4.2* 4.0* 4.5*  ALBUMIN <1.5* <1.5* <1.5* <1.5*    TUMOR MARKERS: No results for input(s): AFPTM, CEA, CA199, CHROMGRNA in the last 8760 hours.  Assessment and Plan:  Continued GI bleeding - unable to localize on CTA.  Images reviewed by Dr. Jenna and by Dr. Hughes - there is no active extravasation, however empiric embolization of the GDA is worth a try.  Will proceed with mesenteric angiography and empiric GDA embolization today by Dr. Hughes.  The Risks and benefits of embolization were discussed with the patient including, but not limited to bleeding, infection, vascular injury, post operative pain, or contrast induced renal failure.  This procedure involves the use of X-rays and because of the nature of the planned procedure, it is possible that we will have prolonged use of X-ray fluoroscopy.  Potential radiation risks to you include (but are not limited to) the following: - A slightly elevated risk for cancer several  years later in life. This risk is typically less than 0.5% percent. This risk is low in comparison to the normal incidence of human cancer, which is 33% for women and 50% for men according to the American [employer] Society. - Radiation induced injury can include skin redness, resembling a rash, tissue breakdown / ulcers and  hair loss (which can be temporary or permanent).   The likelihood of either of these occurring depends on the difficulty of the procedure and whether you are sensitive to radiation due to previous procedures, disease, or genetic conditions.   IF your procedure requires a prolonged use of radiation, you will be notified and given written instructions for further action.  It is your responsibility to monitor the irradiated area for the 2 weeks following the procedure and to notify your physician if you are concerned that you have suffered a radiation induced injury.    All of the patient's questions were answered, patient is agreeable to proceed. Consent signed and in chart.   Electronically Signed: SARI GORMAN LAMP, PA-C   06/25/2024, 11:40 AM      I spent a total of 40 Minutes  in face to face in clinical consultation, greater than 50% of which was counseling/coordinating care for empiric GDA embo.

## 2024-06-25 NOTE — Plan of Care (Signed)
  Problem: Education: Goal: Knowledge of General Education information will improve Description: Including pain rating scale, medication(s)/side effects and non-pharmacologic comfort measures Outcome: Progressing   Problem: Health Behavior/Discharge Planning: Goal: Ability to manage health-related needs will improve Outcome: Progressing   Problem: Clinical Measurements: Goal: Ability to maintain clinical measurements within normal limits will improve Outcome: Progressing Goal: Will remain free from infection Outcome: Progressing Goal: Diagnostic test results will improve Outcome: Progressing Goal: Respiratory complications will improve Outcome: Progressing Goal: Cardiovascular complication will be avoided Outcome: Progressing   Problem: Activity: Goal: Risk for activity intolerance will decrease Outcome: Progressing   Problem: Nutrition: Goal: Adequate nutrition will be maintained Outcome: Progressing   Problem: Coping: Goal: Level of anxiety will decrease Outcome: Progressing   Problem: Elimination: Goal: Will not experience complications related to bowel motility Outcome: Progressing Goal: Will not experience complications related to urinary retention Outcome: Progressing   Problem: Pain Managment: Goal: General experience of comfort will improve and/or be controlled Outcome: Progressing   Problem: Safety: Goal: Ability to remain free from injury will improve Outcome: Progressing   Problem: Skin Integrity: Goal: Risk for impaired skin integrity will decrease Outcome: Progressing   Problem: Safety: Goal: Non-violent Restraint(s) Outcome: Progressing   Problem: Activity: Goal: Ability to tolerate increased activity will improve Outcome: Progressing   Problem: Respiratory: Goal: Ability to maintain a clear airway and adequate ventilation will improve Outcome: Progressing   Problem: Role Relationship: Goal: Method of communication will improve Outcome:  Progressing   0550 Pt vitals rechecked, BP 103/49 HR 52, pt denies andy discomfort, skin care given and pt resting and voices no discomforts, Oxycodone  5 mg given and effective for pain.

## 2024-06-26 DIAGNOSIS — K276 Chronic or unspecified peptic ulcer, site unspecified, with both hemorrhage and perforation: Secondary | ICD-10-CM | POA: Diagnosis not present

## 2024-06-26 DIAGNOSIS — N179 Acute kidney failure, unspecified: Secondary | ICD-10-CM | POA: Diagnosis not present

## 2024-06-26 DIAGNOSIS — G4733 Obstructive sleep apnea (adult) (pediatric): Secondary | ICD-10-CM | POA: Diagnosis not present

## 2024-06-26 DIAGNOSIS — J449 Chronic obstructive pulmonary disease, unspecified: Secondary | ICD-10-CM | POA: Diagnosis not present

## 2024-06-26 LAB — BASIC METABOLIC PANEL WITH GFR
Anion gap: 9 (ref 5–15)
BUN: 40 mg/dL — ABNORMAL HIGH (ref 8–23)
CO2: 24 mmol/L (ref 22–32)
Calcium: 7.8 mg/dL — ABNORMAL LOW (ref 8.9–10.3)
Chloride: 103 mmol/L (ref 98–111)
Creatinine, Ser: 1.18 mg/dL — ABNORMAL HIGH (ref 0.44–1.00)
GFR, Estimated: 50 mL/min — ABNORMAL LOW (ref >60)
Glucose, Bld: 76 mg/dL (ref 70–99)
Potassium: 3.6 mmol/L (ref 3.5–5.1)
Sodium: 136 mmol/L (ref 135–145)

## 2024-06-26 LAB — TROPONIN I (HIGH SENSITIVITY)
Troponin I (High Sensitivity): 64 ng/L — ABNORMAL HIGH (ref <18)
Troponin I (High Sensitivity): 58 ng/L — ABNORMAL HIGH (ref <18)

## 2024-06-26 LAB — CBC
HCT: 26 % — ABNORMAL LOW (ref 36.0–46.0)
HCT: 22 % — ABNORMAL LOW (ref 36.0–46.0)
HCT: 22.2 % — ABNORMAL LOW (ref 36.0–46.0)
Hemoglobin: 8.5 g/dL — ABNORMAL LOW (ref 12.0–15.0)
Hemoglobin: 7.2 g/dL — ABNORMAL LOW (ref 12.0–15.0)
Hemoglobin: 7.3 g/dL — ABNORMAL LOW (ref 12.0–15.0)
MCH: 31.4 pg (ref 26.0–34.0)
MCH: 30.9 pg (ref 26.0–34.0)
MCH: 31.9 pg (ref 26.0–34.0)
MCHC: 32.7 g/dL (ref 30.0–36.0)
MCHC: 32.7 g/dL (ref 30.0–36.0)
MCHC: 32.9 g/dL (ref 30.0–36.0)
MCV: 95.9 fL (ref 80.0–100.0)
MCV: 94.4 fL (ref 80.0–100.0)
MCV: 96.9 fL (ref 80.0–100.0)
Platelets: 190 K/uL (ref 150–400)
Platelets: 175 K/uL (ref 150–400)
Platelets: 192 K/uL (ref 150–400)
RBC: 2.71 MIL/uL — ABNORMAL LOW (ref 3.87–5.11)
RBC: 2.33 MIL/uL — ABNORMAL LOW (ref 3.87–5.11)
RBC: 2.29 MIL/uL — ABNORMAL LOW (ref 3.87–5.11)
RDW: 18.2 % — ABNORMAL HIGH (ref 11.5–15.5)
RDW: 18.1 % — ABNORMAL HIGH (ref 11.5–15.5)
RDW: 18 % — ABNORMAL HIGH (ref 11.5–15.5)
WBC: 8.5 K/uL (ref 4.0–10.5)
WBC: 8.3 K/uL (ref 4.0–10.5)
WBC: 8.6 K/uL (ref 4.0–10.5)
nRBC: 0 % (ref 0.0–0.2)
nRBC: 0 % (ref 0.0–0.2)
nRBC: 0.2 % (ref 0.0–0.2)

## 2024-06-26 LAB — BPAM RBC
Blood Product Expiration Date: 202507252359
Blood Product Expiration Date: 202508052359
Blood Product Expiration Date: 202508062359
ISSUE DATE / TIME: 202507020843
ISSUE DATE / TIME: 202507021223
ISSUE DATE / TIME: 202507030841
Unit Type and Rh: 5100
Unit Type and Rh: 5100
Unit Type and Rh: 5100

## 2024-06-26 LAB — TYPE AND SCREEN
ABO/RH(D): O POS
Antibody Screen: NEGATIVE
Unit division: 0
Unit division: 0
Unit division: 0

## 2024-06-26 MED ORDER — FENTANYL CITRATE PF 50 MCG/ML IJ SOSY
12.5000 ug | PREFILLED_SYRINGE | Freq: Once | INTRAMUSCULAR | Status: AC | PRN
  Administered 2024-06-26: 12.5 ug via INTRAVENOUS
  Filled 2024-06-26: qty 1

## 2024-06-26 MED ORDER — MORPHINE SULFATE (PF) 2 MG/ML IV SOLN
1.0000 mg | INTRAVENOUS | Status: DC | PRN
  Administered 2024-06-26 – 2024-07-10 (×13): 1 mg via INTRAVENOUS
  Filled 2024-06-26 (×13): qty 1

## 2024-06-26 NOTE — Progress Notes (Signed)
 PROGRESS NOTE        PATIENT DETAILS Name: Christina Castaneda Age: 68 y.o. Sex: female Date of Birth: 04-11-1956 Admit Date: 06/07/2024 Admitting Physician Orlin Fairly, MD ERE:Floopd, Mariano SQUIBB, DO  Brief Summary: Patient is a 68 y.o.  female with history of perforated sigmoid colon-s/p Hartman's/colostomy 2017-who initially presented to [facility] following a mechanical fall-she was found to have left distal femur fracture-underwent retrograde femoral nail on 6/20, unfortunately-further hospital course complicated by perforated duodenal ulcer with acute blood loss anemia resulting in hypovolemic shock-she was evaluated by general surgery and underwent emergent exploratory laparotomy with Graham's patch-postoperatively-she was transferred to [facility] ICU.  She was stabilized and then subsequently transferred to [facility] service.  Unfortunately-further hospital course complicated by recurrent melena through ostomy and acute blood loss anemia-after discussion with [facility] and then with interventional radiology-patient underwent on GDA embolization on 7/4.  See below for further details.   Significant events: 6/15>> mechanical fall-left femur fracture/AKI-admit to [facility] at [facility] 6/20>> retrograde femoral nail by orthopedics at [facility] 6/23>> perforated duodenal ulcer-hypotension-exploratory laparotomy/placement of Arlyss supplies.  Remained intubated postprocedure-transferred to [facility] ICU. 6/24>> extubated 6/25>> off pressors-out of ICU-drop in hemoglobin but nonbloody ostomy output. 6/26>> transferred to [facility] service 7/02>> Black tarry stools in ostomy-Hb down to 6.1-General Surgery reconsulted-too early for EGD-CT angio negative for bleed-IR deferred embolization 7/03 >> continues to have black tarry stools-IR reconsulted for embolization.  Significant studies: 6/15>> CT left knee: Acute comminuted fracture of distal left femoral shaft. 6/16>> renal ultrasound: No hydronephrosis 6/23>> CT angiogram  abdomen: Perforated and bleeding peptic ulcer disease affecting proximal duodenum-appears to be hematoma in the gastric antrum/proximal duodenum. 6/26>> upper GI series with KUB: No evidence of contrast leak. 7/02>> CT angio GI bleed study: No active bleeding.  Significant microbiology data: 6/23>> blood culture: No growth 6/23>> peritoneal fluid: Abundant Candida albicans/moderate Candida glabrata.  Procedures: 6/20>> retrograde femoral nail by orthopedics at [facility] 6/23>> exploratory laparotomy with omental Graham's patch repair 7/03>> GDA embolization.  Consults: GI at [facility] General Surgery at [facility] IR  Subjective: Colostomy bag was emptied this morning-no black stools noted.  Objective: Vitals: Blood pressure 120/62, pulse 62, temperature 97.6 F (36.4 C), temperature source Oral, resp. rate 20, height 5' 5 (1.651 m), weight 112 kg, last menstrual period 11/18/2012, SpO2 94%.   Exam: Awake/alert Chest: Clear to auscultation Abdomen: Soft nontender nondistended-empty colostomy bag Extremities: No edema Generalized weakness but nonfocal.  Pertinent Labs/Radiology:    Latest Ref Rng & Units 06/26/2024    4:45 AM 06/25/2024    3:41 PM 06/25/2024    3:55 AM  CBC  WBC 4.0 - 10.5 K/uL 8.5  7.2  8.0   Hemoglobin 12.0 - 15.0 g/dL 8.5  9.2  7.2   Hematocrit 36.0 - 46.0 % 26.0  27.8  21.9   Platelets 150 - 400 K/uL 190  195  181     Lab Results  Component Value Date   NA 136 06/26/2024   K 3.6 06/26/2024   CL 103 06/26/2024   CO2 24 06/26/2024     Assessment/Plan: Hemorrhagic and septic shock secondary to perforated bleeding duodenal ulcer with acute blood loss anemia-s/p exploratory laparotomy with Arlyss patch repair on 6/23 at [facility] Recurrent upper GI bleeding with acute blood loss anemia noted on 7/2-CTA negative for extravasation-s/p GDA embolization by IR on  7/4. Had stabilized-unfortunately developed melanotic appearing stools on 7/2-although CTA negative-after  discussion with general surgery-not safe for endoscopy-underwent gastroduodenal artery embolization by IR on 7/3. Hb this morning stable-colostomy bag is empty without any black stools this morning-however overnight did have a bit of melanotic appearing stools per nursing staff.  Not sure if this is old blood making its way out as Hb is relatively stable. Plan is to follow CBC closely Remains on IV PPI.  Mechanical fall with left femur fracture-s/p retrograde left femoral nail on 6/20 Per orthopedics-WBAT LLE, knee immobilizer when ambulating, remove staples in 2 weeks.  Follow-up with Dr. Oneil Salvage at [place] 2 weeks from discharge.  AKI Likely hemodynamically mediated Resolved  Acute urinary retention Foley catheter remains in place-given plans for potential embolization-will keep in place for now-voiding trial when she is a bit more stable.  HTN BP soft-continue to hold all antihypertensives  HLD Continue to hold Crestor -resume when able-currently n.p.o.  COPD Stable-no wheezing Continue bronchodilators Stop further steroids at this point.  OSA CPAP nightly  Hypothyroidism Synthroid   History of perforated sigmoid colon-s/p Hartman/colostomy 2017  Debility/deconditioning PT/OT eval-SNF recommended.  Nutrition Status: Nutrition Problem: Increased nutrient needs Etiology: acute illness, wound healing Signs/Symptoms: estimated needs Interventions: Refer to RD note for recommendations   Pressure Ulcer: Agree with assessment and plan as outlined below. Pressure Injury 05/27/24 Sacrum Stage 2 -  Partial thickness loss of dermis presenting as a shallow open injury with a red, pink wound bed without slough. 4 small stage II present, each measures 0.4x 0.4 x 0.2 (Active)  05/27/24 1815  Location: Sacrum  Location Orientation:   Staging: Stage 2 -  Partial thickness loss of dermis presenting as a shallow open injury with a red, pink wound bed without slough.   Wound Description (Comments): 4 small stage II present, each measures 0.4x 0.4 x 0.2  DO NOT USE:  Present on Admission:   Dressing Type Foam - Lift dressing to assess site every shift 06/24/24 9167    Morbid Obesity: Estimated body mass index is 41.09 kg/m as calculated from the following:   Height as of this encounter: 5' 5 (1.651 m).   Weight as of this encounter: 112 kg.   Code status:   Code Status: Full Code   DVT Prophylaxis: Place and maintain sequential compression device Start: 06/18/24 1325 SCDs Start: 06/15/24 1937 Place and maintain sequential compression device Start: 06/15/24 0155 SQ heparin  discontinued 7/2-due to high GI bleeding with ABLA.  Family Communication: Spouse Randall-[phone]-updated 7/3   Disposition Plan: Status is: Inpatient Remains inpatient appropriate because: Severity of illness   Planned Discharge Destination:Skilled nursing facility   Diet: Diet Order             Diet NPO time specified  Diet effective now                     Antimicrobial agents: Anti-infectives (From admission, onward)    Start     Dose/Rate Route Frequency Ordered Stop   06/16/24 1800  vancomycin  (VANCOCIN ) IVPB 1000 mg/200 mL premix  Status:  Discontinued        1,000 mg 200 mL/hr over 60 Minutes Intravenous Every 24 hours 06/15/24 1317 06/15/24 1328   06/15/24 1700  vancomycin  (VANCOREADY) IVPB 2000 mg/400 mL  Status:  Discontinued        2,000 mg 200 mL/hr over 120 Minutes Intravenous  Once 06/15/24 1317 06/15/24 1328   06/15/24 1600  fluconazole  (DIFLUCAN ) IVPB  400 mg  Status:  Discontinued        400 mg 100 mL/hr over 120 Minutes Intravenous Every 24 hours 06/15/24 1317 06/16/24 0914   06/15/24 1500  metroNIDAZOLE  (FLAGYL ) IVPB 500 mg  Status:  Discontinued        500 mg 100 mL/hr over 60 Minutes Intravenous Every 12 hours 06/15/24 1216 06/15/24 1255   06/15/24 1400  ciprofloxacin  (CIPRO ) IVPB 400 mg  Status:  Discontinued        400  mg 200 mL/hr over 60 Minutes Intravenous Every 12 hours 06/15/24 1216 06/15/24 1255   06/15/24 1400  piperacillin -tazobactam (ZOSYN ) IVPB 3.375 g        3.375 g 12.5 mL/hr over 240 Minutes Intravenous Every 8 hours 06/15/24 1317 06/20/24 2359   06/15/24 1400  vancomycin  (VANCOCIN ) IVPB 1000 mg/200 mL premix  Status:  Discontinued        1,000 mg 200 mL/hr over 60 Minutes Intravenous Every 24 hours 06/15/24 1328 06/16/24 0914   06/15/24 1333  vancomycin  (VANCOCIN ) 1-5 GM/200ML-% IVPB       Note to Pharmacy: Romona Shaver E: cabinet override      06/15/24 1333 06/15/24 1410   06/15/24 1221  ciprofloxacin  (CIPRO ) 400 MG/200ML IVPB  Status:  Discontinued       Note to Pharmacy: Dannielle Railing S: cabinet override      06/15/24 1221 06/15/24 1343   06/15/24 1221  metroNIDAZOLE  (FLAGYL ) 500 MG/100ML IVPB  Status:  Discontinued       Note to Pharmacy: Dannielle Railing S: cabinet override      06/15/24 1221 06/15/24 1343   06/12/24 1223  clindamycin  (CLEOCIN ) 900 MG/50ML IVPB       Note to Pharmacy: Elaine Nest L: cabinet override      06/12/24 1223 06/12/24 1332   06/12/24 1131  ceFAZolin  (ANCEF ) 2-4 GM/100ML-% IVPB  Status:  Discontinued       Note to Pharmacy: Billy Sluder H: cabinet override      06/12/24 1131 06/12/24 1434   06/11/24 1300  clindamycin  (CLEOCIN ) IVPB 900 mg  Status:  Discontinued        900 mg 100 mL/hr over 30 Minutes Intravenous On call to O.R. 06/10/24 1041 06/10/24 1447   06/11/24 1300  ceFAZolin  (ANCEF ) IVPB 2g/100 mL premix        2 g 200 mL/hr over 30 Minutes Intravenous On call to O.R. 06/10/24 1447 06/12/24 1908        MEDICATIONS: Scheduled Meds:  sodium chloride    Intravenous Once   sodium chloride    Intravenous Once   sodium chloride    Intravenous Once   arformoterol   15 mcg Nebulization Q12H   budesonide  (PULMICORT ) nebulizer solution  0.5 mg Nebulization BID   Chlorhexidine  Gluconate Cloth  6 each Topical Daily   feeding supplement  237  mL Oral BID BM   levothyroxine   150 mcg Oral Q0600   multivitamin with minerals  1 tablet Oral Daily   nutrition supplement (JUVEN)  1 packet Oral BID AC & HS   nystatin    Topical BID   pantoprazole  (PROTONIX ) IV  40 mg Intravenous Q12H   sodium chloride  flush  10-40 mL Intracatheter Q12H   sodium chloride  flush  3 mL Intravenous Q12H   sodium hypochlorite   Irrigation BID   Continuous Infusions: PRN Meds:.dextrose , iohexol , ipratropium-albuterol , magic mouthwash w/lidocaine , morphine  injection, naLOXone  (NARCAN )  injection, [DISCONTINUED] ondansetron  **OR** ondansetron  (ZOFRAN ) IV, mouth rinse, oxyCODONE , polyethylene glycol, prochlorperazine , sodium chloride  flush,  sodium chloride  flush   I have personally reviewed following labs and imaging studies  LABORATORY DATA: CBC: Recent Labs  Lab 06/24/24 0404 06/24/24 1858 06/25/24 0355 06/25/24 1541 06/26/24 0445  WBC 8.2 7.8 8.0 7.2 8.5  HGB 6.1* 8.4* 7.2* 9.2* 8.5*  HCT 19.7* 25.7* 21.9* 27.8* 26.0*  MCV 102.6* 95.2 96.1 92.7 95.9  PLT 165 184 181 195 190    Basic Metabolic Panel: Recent Labs  Lab 06/20/24 0428 06/21/24 0420 06/22/24 0459 06/23/24 0408 06/24/24 1858 06/25/24 0355 06/26/24 0445  NA 141 137 134* 132* 133* 133* 136  K 4.4 4.6 4.3 4.2 4.1 3.8 3.6  CL 108 102 102 101 100 100 103  CO2 27 26 27 26 25 26 24   GLUCOSE 94 88 84 83 97 90 76  BUN 20 26* 26* 32* 49* 52* 40*  CREATININE 1.08* 1.03* 1.02* 0.95 1.10* 1.05* 1.18*  CALCIUM  7.7* 7.9* 8.0* 8.1* 7.8* 8.2* 7.8*  MG 2.1 1.9 1.9  --   --   --   --   PHOS 2.9 3.1 3.1  --   --   --   --     GFR: Estimated Creatinine Clearance: 55.3 mL/min (A) (by C-G formula based on SCr of 1.18 mg/dL (H)).  Liver Function Tests: Recent Labs  Lab 06/22/24 0459  AST 35  ALT 22  ALKPHOS 42  BILITOT 0.6  PROT 4.5*  ALBUMIN <1.5*   No results for input(s): LIPASE, AMYLASE in the last 168 hours. No results for input(s): AMMONIA in the last 168  hours.  Coagulation Profile: No results for input(s): INR, PROTIME in the last 168 hours.  Cardiac Enzymes: No results for input(s): CKTOTAL, CKMB, CKMBINDEX, TROPONINI in the last 168 hours.  BNP (last 3 results) No results for input(s): PROBNP in the last 8760 hours.  Lipid Profile: No results for input(s): CHOL, HDL, LDLCALC, TRIG, CHOLHDL, LDLDIRECT in the last 72 hours.   Thyroid  Function Tests: No results for input(s): TSH, T4TOTAL, FREET4, T3FREE, THYROIDAB in the last 72 hours.  Anemia Panel: No results for input(s): VITAMINB12, FOLATE, FERRITIN, TIBC, IRON, RETICCTPCT in the last 72 hours.  Urine analysis:    Component Value Date/Time   COLORURINE YELLOW 06/07/2024 2026   APPEARANCEUR HAZY (A) 06/07/2024 2026   LABSPEC 1.015 06/07/2024 2026   PHURINE 5.0 06/07/2024 2026   GLUCOSEU NEGATIVE 06/07/2024 2026   HGBUR NEGATIVE 06/07/2024 2026   BILIRUBINUR NEGATIVE 06/07/2024 2026   KETONESUR NEGATIVE 06/07/2024 2026   PROTEINUR NEGATIVE 06/07/2024 2026   NITRITE NEGATIVE 06/07/2024 2026   LEUKOCYTESUR NEGATIVE 06/07/2024 2026    Sepsis Labs: Lactic Acid, Venous    Component Value Date/Time   LATICACIDVEN 1.0 05/27/2024 1945    MICROBIOLOGY: No results found for this or any previous visit (from the past 240 hours).   RADIOLOGY STUDIES/RESULTS: IR Transcath/Emboliz Result Date: 06/26/2024 INDICATION: Refractory upper GI bleed s/p endoscopy. EXAM: Title; COIL EMBOLIZATION OF GASTRODUODENAL ARTERY Listed procedures; 1.  ULTRASOUND GUIDANCE FOR ARTERIAL ACCESS 2. MESENTERIC ARTERIOGRAPHY, INCLUDING CELIAC, COMMON HEPATIC and GASTRODUODENAL ARTERIOGRAMS 3.  COIL EMBOLIZATION OF GASTRODUODENAL ARTERY COMPARISON:  CTA abdomen pelvis, 06/24/2024 MEDICATIONS: 4 mg Zofran  IV.  50 mg Benadryl  IV. ANESTHESIA/SEDATION: Moderate (conscious) sedation was employed during this procedure. A total of Versed  1.5 mg and Fentanyl  75 mcg  was administered intravenously. Moderate Sedation Time: 89 minutes. The patient's level of consciousness and vital signs were monitored continuously by radiology nursing throughout the procedure under my direct supervision. CONTRAST:  100  mL Omnipaque  300 FLUOROSCOPY: Radiation Exposure Index and estimated peak skin dose (PSD); Reference air kerma (RAK), 448.5 mGy. Kerma-area product (KAP), 7499.3 uGy*m. COMPLICATIONS: None immediate. PROCEDURE: Informed consent was obtained from the patient and/or patient's representative following explanation of the procedure, risks, benefits and alternatives. All questions were addressed. A time out was performed prior to the initiation of the procedure. Maximal barrier sterile technique utilized including caps, mask, sterile gowns, sterile gloves, large sterile drape, hand hygiene, and chlorhexidine  prep. The RIGHT femoral head was marked fluoroscopically. Under sterile conditions and local anesthesia, the RIGHT common femoral artery access was performed with a micropuncture needle. Under direct ultrasound guidance, the RIGHT common femoral was accessed with a micropuncture kit. An ultrasound image was saved for documentation purposes. This allowed for placement of a 5 Fr 35 cm vascular sheath. A limited arteriogram was performed through the side arm of the sheath confirming appropriate access within the RIGHT common femoral artery. A limited abdominal aortogram was performed to help identify the celiac artery origin. Over a Bentson wire, a C2 catheter was advanced, back bled and flushed. The catheter was then utilized to select the celiac access, then advanced into the common hepatic then gastroduodenal arteries. Selective mesenteric arteriograms were performed at each level. Initial coil deployment was performed with the 5 Fr catheter within the gastroduodenal artery, then a 6 mm 0.035 inch Terumo Azure was attempted to be deployed, however due deployment mechanism fraying was  noted on reposition attempt. The 5 Fr sheath was exchanged for an 8 Fr 35 cm vascular sheath, then a 6 Fr snare was deployed. The frayed deployment mechanism was ensnared, and the mechanism and coil were removed intact. The C2 catheter was readvanced and utilized to select the celiac axis, and finally back into the GDA. Using a 2.4 Fr microcatheter and 0.016 inch Fathom microwire access into the gastroduodenal artery was performed and a selective arteriogram was performed. Selective embolization with multiple 0.018 inch micro coils was performed. The microcatheter was removed and arteriogram with the 5 Fr catheter at the common hepatic artery was performed. Adequate pruning of the GDA feeder arteries was achieved on post embolization arteriogram. Images were reviewed and the procedure was terminated. All wires, catheters and sheaths were removed from the patient. Hemostasis was achieved at the RIGHT groin access site with Angio-Seal closure device. The patient tolerated the procedure well without immediate post procedural complication. FINDINGS: *Celiac and common hepatic arteriograms with normal order and branching. *Gastroduodenal arteriogram without active extravasation noted. *Embolization with adequate pruning of the GDA distribution arteries. *Access via the RIGHT femoral artery. Angio-Seal closure at RIGHT groin. Palpable RLE pulses at the end of the Case IMPRESSION: 1. 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 2. Successful empiric coil embolization of the gastroduodenal artery for refractory upper GI bleed. PLAN: - The patient is to remain flat for 2 hours with RIGHT leg straight. - The patient may continue to experience residual melena however should resolve in the coming days. Thom Hall, MD [facility] Radiology Electronically Signed   By: Thom Hall M.D.   On: 06/26/2024 10:26   IR US  Guide Vasc  Access Right Result Date: 06/26/2024 INDICATION: Refractory upper GI bleed s/p endoscopy. EXAM: Title; COIL EMBOLIZATION OF GASTRODUODENAL ARTERY Listed procedures; 1.  ULTRASOUND GUIDANCE FOR ARTERIAL ACCESS 2. MESENTERIC ARTERIOGRAPHY, INCLUDING CELIAC, COMMON HEPATIC and GASTRODUODENAL ARTERIOGRAMS 3.  COIL EMBOLIZATION OF GASTRODUODENAL ARTERY COMPARISON:  CTA abdomen pelvis, 06/24/2024 MEDICATIONS:  4 mg Zofran  IV.  50 mg Benadryl  IV. ANESTHESIA/SEDATION: Moderate (conscious) sedation was employed during this procedure. A total of Versed  1.5 mg and Fentanyl  75 mcg was administered intravenously. Moderate Sedation Time: 89 minutes. The patient's level of consciousness and vital signs were monitored continuously by radiology nursing throughout the procedure under my direct supervision. CONTRAST:  100 mL Omnipaque  300 FLUOROSCOPY: Radiation Exposure Index and estimated peak skin dose (PSD); Reference air kerma (RAK), 448.5 mGy. Kerma-area product (KAP), 7499.3 uGy*m. COMPLICATIONS: None immediate. PROCEDURE: Informed consent was obtained from the patient and/or patient's representative following explanation of the procedure, risks, benefits and alternatives. All questions were addressed. A time out was performed prior to the initiation of the procedure. Maximal barrier sterile technique utilized including caps, mask, sterile gowns, sterile gloves, large sterile drape, hand hygiene, and chlorhexidine  prep. The RIGHT femoral head was marked fluoroscopically. Under sterile conditions and local anesthesia, the RIGHT common femoral artery access was performed with a micropuncture needle. Under direct ultrasound guidance, the RIGHT common femoral was accessed with a micropuncture kit. An ultrasound image was saved for documentation purposes. This allowed for placement of a 5 Fr 35 cm vascular sheath. A limited arteriogram was performed through the side arm of the sheath confirming appropriate access within the RIGHT  common femoral artery. A limited abdominal aortogram was performed to help identify the celiac artery origin. Over a Bentson wire, a C2 catheter was advanced, back bled and flushed. The catheter was then utilized to select the celiac access, then advanced into the common hepatic then gastroduodenal arteries. Selective mesenteric arteriograms were performed at each level. Initial coil deployment was performed with the 5 Fr catheter within the gastroduodenal artery, then a 6 mm 0.035 inch Terumo Azure was attempted to be deployed, however due deployment mechanism fraying was noted on reposition attempt. The 5 Fr sheath was exchanged for an 8 Fr 35 cm vascular sheath, then a 6 Fr snare was deployed. The frayed deployment mechanism was ensnared, and the mechanism and coil were removed intact. The C2 catheter was readvanced and utilized to select the celiac axis, and finally back into the GDA. Using a 2.4 Fr microcatheter and 0.016 inch Fathom microwire access into the gastroduodenal artery was performed and a selective arteriogram was performed. Selective embolization with multiple 0.018 inch micro coils was performed. The microcatheter was removed and arteriogram with the 5 Fr catheter at the common hepatic artery was performed. Adequate pruning of the GDA feeder arteries was achieved on post embolization arteriogram. Images were reviewed and the procedure was terminated. All wires, catheters and sheaths were removed from the patient. Hemostasis was achieved at the RIGHT groin access site with Angio-Seal closure device. The patient tolerated the procedure well without immediate post procedural complication. FINDINGS: *Celiac and common hepatic arteriograms with normal order and branching. *Gastroduodenal arteriogram without active extravasation noted. *Embolization with adequate pruning of the GDA distribution arteries. *Access via the RIGHT femoral artery. Angio-Seal closure at RIGHT groin. Palpable RLE pulses at the  end of the Case IMPRESSION: 1. 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 2. Successful empiric coil embolization of the gastroduodenal artery for refractory upper GI bleed. PLAN: - The patient is to remain flat for 2 hours with RIGHT leg straight. - The patient may continue to experience residual melena however should resolve in the coming days. Thom Hall, MD [facility] Radiology Electronically Signed   By: Thom Hall M.D.   On:  06/26/2024 10:26   IR Angiogram Visceral Selective Result Date: 06/26/2024 INDICATION: Refractory upper GI bleed s/p endoscopy. EXAM: Title; COIL EMBOLIZATION OF GASTRODUODENAL ARTERY Listed procedures; 1.  ULTRASOUND GUIDANCE FOR ARTERIAL ACCESS 2. MESENTERIC ARTERIOGRAPHY, INCLUDING CELIAC, COMMON HEPATIC and GASTRODUODENAL ARTERIOGRAMS 3.  COIL EMBOLIZATION OF GASTRODUODENAL ARTERY COMPARISON:  CTA abdomen pelvis, 06/24/2024 MEDICATIONS: 4 mg Zofran  IV.  50 mg Benadryl  IV. ANESTHESIA/SEDATION: Moderate (conscious) sedation was employed during this procedure. A total of Versed  1.5 mg and Fentanyl  75 mcg was administered intravenously. Moderate Sedation Time: 89 minutes. The patient's level of consciousness and vital signs were monitored continuously by radiology nursing throughout the procedure under my direct supervision. CONTRAST:  100 mL Omnipaque  300 FLUOROSCOPY: Radiation Exposure Index and estimated peak skin dose (PSD); Reference air kerma (RAK), 448.5 mGy. Kerma-area product (KAP), 7499.3 uGy*m. COMPLICATIONS: None immediate. PROCEDURE: Informed consent was obtained from the patient and/or patient's representative following explanation of the procedure, risks, benefits and alternatives. All questions were addressed. A time out was performed prior to the initiation of the procedure. Maximal barrier sterile technique utilized including caps, mask, sterile gowns, sterile  gloves, large sterile drape, hand hygiene, and chlorhexidine  prep. The RIGHT femoral head was marked fluoroscopically. Under sterile conditions and local anesthesia, the RIGHT common femoral artery access was performed with a micropuncture needle. Under direct ultrasound guidance, the RIGHT common femoral was accessed with a micropuncture kit. An ultrasound image was saved for documentation purposes. This allowed for placement of a 5 Fr 35 cm vascular sheath. A limited arteriogram was performed through the side arm of the sheath confirming appropriate access within the RIGHT common femoral artery. A limited abdominal aortogram was performed to help identify the celiac artery origin. Over a Bentson wire, a C2 catheter was advanced, back bled and flushed. The catheter was then utilized to select the celiac access, then advanced into the common hepatic then gastroduodenal arteries. Selective mesenteric arteriograms were performed at each level. Initial coil deployment was performed with the 5 Fr catheter within the gastroduodenal artery, then a 6 mm 0.035 inch Terumo Azure was attempted to be deployed, however due deployment mechanism fraying was noted on reposition attempt. The 5 Fr sheath was exchanged for an 8 Fr 35 cm vascular sheath, then a 6 Fr snare was deployed. The frayed deployment mechanism was ensnared, and the mechanism and coil were removed intact. The C2 catheter was readvanced and utilized to select the celiac axis, and finally back into the GDA. Using a 2.4 Fr microcatheter and 0.016 inch Fathom microwire access into the gastroduodenal artery was performed and a selective arteriogram was performed. Selective embolization with multiple 0.018 inch micro coils was performed. The microcatheter was removed and arteriogram with the 5 Fr catheter at the common hepatic artery was performed. Adequate pruning of the GDA feeder arteries was achieved on post embolization arteriogram. Images were reviewed and the  procedure was terminated. All wires, catheters and sheaths were removed from the patient. Hemostasis was achieved at the RIGHT groin access site with Angio-Seal closure device. The patient tolerated the procedure well without immediate post procedural complication. FINDINGS: *Celiac and common hepatic arteriograms with normal order and branching. *Gastroduodenal arteriogram without active extravasation noted. *Embolization with adequate pruning of the GDA distribution arteries. *Access via the RIGHT femoral artery. Angio-Seal closure at RIGHT groin. Palpable RLE pulses at the end of the Case IMPRESSION: 1. 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 2. Successful empiric coil embolization of the gastroduodenal artery  for refractory upper GI bleed. PLAN: - The patient is to remain flat for 2 hours with RIGHT leg straight. - The patient may continue to experience residual melena however should resolve in the coming days. Thom Hall, MD [facility] Radiology Electronically Signed   By: Thom Hall M.D.   On: 06/26/2024 10:26   IR Angiogram Visceral Selective Result Date: 06/26/2024 INDICATION: Refractory upper GI bleed s/p endoscopy. EXAM: Title; COIL EMBOLIZATION OF GASTRODUODENAL ARTERY Listed procedures; 1.  ULTRASOUND GUIDANCE FOR ARTERIAL ACCESS 2. MESENTERIC ARTERIOGRAPHY, INCLUDING CELIAC, COMMON HEPATIC and GASTRODUODENAL ARTERIOGRAMS 3.  COIL EMBOLIZATION OF GASTRODUODENAL ARTERY COMPARISON:  CTA abdomen pelvis, 06/24/2024 MEDICATIONS: 4 mg Zofran  IV.  50 mg Benadryl  IV. ANESTHESIA/SEDATION: Moderate (conscious) sedation was employed during this procedure. A total of Versed  1.5 mg and Fentanyl  75 mcg was administered intravenously. Moderate Sedation Time: 89 minutes. The patient's level of consciousness and vital signs were monitored continuously by radiology nursing throughout the procedure under my direct  supervision. CONTRAST:  100 mL Omnipaque  300 FLUOROSCOPY: Radiation Exposure Index and estimated peak skin dose (PSD); Reference air kerma (RAK), 448.5 mGy. Kerma-area product (KAP), 7499.3 uGy*m. COMPLICATIONS: None immediate. PROCEDURE: Informed consent was obtained from the patient and/or patient's representative following explanation of the procedure, risks, benefits and alternatives. All questions were addressed. A time out was performed prior to the initiation of the procedure. Maximal barrier sterile technique utilized including caps, mask, sterile gowns, sterile gloves, large sterile drape, hand hygiene, and chlorhexidine  prep. The RIGHT femoral head was marked fluoroscopically. Under sterile conditions and local anesthesia, the RIGHT common femoral artery access was performed with a micropuncture needle. Under direct ultrasound guidance, the RIGHT common femoral was accessed with a micropuncture kit. An ultrasound image was saved for documentation purposes. This allowed for placement of a 5 Fr 35 cm vascular sheath. A limited arteriogram was performed through the side arm of the sheath confirming appropriate access within the RIGHT common femoral artery. A limited abdominal aortogram was performed to help identify the celiac artery origin. Over a Bentson wire, a C2 catheter was advanced, back bled and flushed. The catheter was then utilized to select the celiac access, then advanced into the common hepatic then gastroduodenal arteries. Selective mesenteric arteriograms were performed at each level. Initial coil deployment was performed with the 5 Fr catheter within the gastroduodenal artery, then a 6 mm 0.035 inch Terumo Azure was attempted to be deployed, however due deployment mechanism fraying was noted on reposition attempt. The 5 Fr sheath was exchanged for an 8 Fr 35 cm vascular sheath, then a 6 Fr snare was deployed. The frayed deployment mechanism was ensnared, and the mechanism and coil were  removed intact. The C2 catheter was readvanced and utilized to select the celiac axis, and finally back into the GDA. Using a 2.4 Fr microcatheter and 0.016 inch Fathom microwire access into the gastroduodenal artery was performed and a selective arteriogram was performed. Selective embolization with multiple 0.018 inch micro coils was performed. The microcatheter was removed and arteriogram with the 5 Fr catheter at the common hepatic artery was performed. Adequate pruning of the GDA feeder arteries was achieved on post embolization arteriogram. Images were reviewed and the procedure was terminated. All wires, catheters and sheaths were removed from the patient. Hemostasis was achieved at the RIGHT groin access site with Angio-Seal closure device. The patient tolerated the procedure well without immediate post procedural complication. FINDINGS: *Celiac and common hepatic arteriograms with normal order and  branching. *Gastroduodenal arteriogram without active extravasation noted. *Embolization with adequate pruning of the GDA distribution arteries. *Access via the RIGHT femoral artery. Angio-Seal closure at RIGHT groin. Palpable RLE pulses at the end of the Case IMPRESSION: 1. 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 2. Successful empiric coil embolization of the gastroduodenal artery for refractory upper GI bleed. PLAN: - The patient is to remain flat for 2 hours with RIGHT leg straight. - The patient may continue to experience residual melena however should resolve in the coming days. Thom Hall, MD [facility] Radiology Electronically Signed   By: Thom Hall M.D.   On: 06/26/2024 10:26   IR Angiogram Visceral Selective Result Date: 06/26/2024 INDICATION: Refractory upper GI bleed s/p endoscopy. EXAM: Title; COIL EMBOLIZATION OF GASTRODUODENAL ARTERY Listed procedures; 1.  ULTRASOUND GUIDANCE FOR ARTERIAL  ACCESS 2. MESENTERIC ARTERIOGRAPHY, INCLUDING CELIAC, COMMON HEPATIC and GASTRODUODENAL ARTERIOGRAMS 3.  COIL EMBOLIZATION OF GASTRODUODENAL ARTERY COMPARISON:  CTA abdomen pelvis, 06/24/2024 MEDICATIONS: 4 mg Zofran  IV.  50 mg Benadryl  IV. ANESTHESIA/SEDATION: Moderate (conscious) sedation was employed during this procedure. A total of Versed  1.5 mg and Fentanyl  75 mcg was administered intravenously. Moderate Sedation Time: 89 minutes. The patient's level of consciousness and vital signs were monitored continuously by radiology nursing throughout the procedure under my direct supervision. CONTRAST:  100 mL Omnipaque  300 FLUOROSCOPY: Radiation Exposure Index and estimated peak skin dose (PSD); Reference air kerma (RAK), 448.5 mGy. Kerma-area product (KAP), 7499.3 uGy*m. COMPLICATIONS: None immediate. PROCEDURE: Informed consent was obtained from the patient and/or patient's representative following explanation of the procedure, risks, benefits and alternatives. All questions were addressed. A time out was performed prior to the initiation of the procedure. Maximal barrier sterile technique utilized including caps, mask, sterile gowns, sterile gloves, large sterile drape, hand hygiene, and chlorhexidine  prep. The RIGHT femoral head was marked fluoroscopically. Under sterile conditions and local anesthesia, the RIGHT common femoral artery access was performed with a micropuncture needle. Under direct ultrasound guidance, the RIGHT common femoral was accessed with a micropuncture kit. An ultrasound image was saved for documentation purposes. This allowed for placement of a 5 Fr 35 cm vascular sheath. A limited arteriogram was performed through the side arm of the sheath confirming appropriate access within the RIGHT common femoral artery. A limited abdominal aortogram was performed to help identify the celiac artery origin. Over a Bentson wire, a C2 catheter was advanced, back bled and flushed. The catheter was then  utilized to select the celiac access, then advanced into the common hepatic then gastroduodenal arteries. Selective mesenteric arteriograms were performed at each level. Initial coil deployment was performed with the 5 Fr catheter within the gastroduodenal artery, then a 6 mm 0.035 inch Terumo Azure was attempted to be deployed, however due deployment mechanism fraying was noted on reposition attempt. The 5 Fr sheath was exchanged for an 8 Fr 35 cm vascular sheath, then a 6 Fr snare was deployed. The frayed deployment mechanism was ensnared, and the mechanism and coil were removed intact. The C2 catheter was readvanced and utilized to select the celiac axis, and finally back into the GDA. Using a 2.4 Fr microcatheter and 0.016 inch Fathom microwire access into the gastroduodenal artery was performed and a selective arteriogram was performed. Selective embolization with multiple 0.018 inch micro coils was performed. The microcatheter was removed and arteriogram with the 5 Fr catheter at the common hepatic artery was performed. Adequate pruning of the GDA feeder arteries  was achieved on post embolization arteriogram. Images were reviewed and the procedure was terminated. All wires, catheters and sheaths were removed from the patient. Hemostasis was achieved at the RIGHT groin access site with Angio-Seal closure device. The patient tolerated the procedure well without immediate post procedural complication. FINDINGS: *Celiac and common hepatic arteriograms with normal order and branching. *Gastroduodenal arteriogram without active extravasation noted. *Embolization with adequate pruning of the GDA distribution arteries. *Access via the RIGHT femoral artery. Angio-Seal closure at RIGHT groin. Palpable RLE pulses at the end of the Case IMPRESSION: 1. 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 2. Successful empiric coil embolization of the gastroduodenal artery for  refractory upper GI bleed. PLAN: - The patient is to remain flat for 2 hours with RIGHT leg straight. - The patient may continue to experience residual melena however should resolve in the coming days. Thom Hall, MD [facility] Radiology Electronically Signed   By: Thom Hall M.D.   On: 06/26/2024 10:26   CT ANGIO GI BLEED Result Date: 06/24/2024 CLINICAL DATA:  Melena with acute blood-loss and anemia. Status post exploratory laparotomy and Arlyss patch placement for duodenitis and duodenal ulcer with perforation on 06/15/2024. Clinical concern for determining if there is active gastrointestinal bleeding. EXAM: CTA ABDOMEN AND PELVIS WITHOUT AND WITH CONTRAST TECHNIQUE: Multidetector CT imaging of the abdomen and pelvis was performed using the standard protocol during bolus administration of intravenous contrast. Multiplanar reconstructed images and MIPs were obtained and reviewed to evaluate the vascular anatomy. RADIATION DOSE REDUCTION: This exam was performed according to the departmental dose-optimization program which includes automated exposure control, adjustment of the mA and/or kV according to patient size and/or use of iterative reconstruction technique. CONTRAST:  OMNIPAQUE  IOHEXOL  350 MG/ML SOLN COMPARISON:  Abdomen and pelvis CT dated 06/15/2024. FINDINGS: VASCULAR Aorta: Atheromatous changes without stenosis, aneurysm or dissection. Celiac: Patent without evidence of aneurysm, dissection, vasculitis or significant stenosis. SMA: Patent without evidence of aneurysm, dissection, vasculitis or significant stenosis. Renals: 2 right renal arteries and 1 left renal artery. There is some plaque formation in the proximal, upper right renal artery producing approximately 50% stenosis. No plaque formation or stenosis involving the more inferior right renal artery. Calcified plaque at the origin of the left renal artery with a proximally 70% stenosis.  IMA: Calcified plaque throughout the inferior aspect of the abdominal aorta without opacification of the inferior mesenteric artery. Inflow: Bilateral calcified plaque formation without aneurysm or significant stenosis. Proximal Outflow: Bilateral calcified plaque formation without aneurysm or significant stenosis. Veins: No obvious venous abnormality within the limitations of this arterial phase study. Review of the MIP images confirms the above findings. NON-VASCULAR Lower chest: Moderate-sized right pleural effusion and small to moderate-sized left pleural effusion. Associated bilateral lower lobe compressive atelectasis. The included portion of the heart is mildly enlarged with no pericardial effusion seen. Hepatobiliary: No focal liver abnormality is seen. Status post cholecystectomy. No biliary dilatation. Pancreas: Unremarkable. No pancreatic ductal dilatation or surrounding inflammatory changes. Spleen: Normal in size without focal abnormality. Adrenals/Urinary Tract: Foley catheter in the urinary bladder. Otherwise, unremarkable bladder and adrenal glands. Simple appearing exophytic cyst arising from the left kidney, not needing imaging follow-up. 7 mm calculus in the upper left renal pelvis. Interval minimal dilatation of the left renal collecting system to the level of the ureteropelvic junction with no obstructing stone or mass seen. No ureteral dilatation. Stomach/Bowel: Interval diffuse low-density gastric wall thickening with mucosal enhancement, most  pronounced involving the gastric antrum. Increased high density material in the 2nd portion of the duodenum on the initial postcontrast images, unchanged on the postcontrast images. No active contrast extravasation seen in the stomach or bowel. Status post partial colectomy including the proximal sigmoid colon. Enteric contrast in the excluded distal sigmoid colon and rectum, unchanged on the precontrast and postcontrast images. Multiple sigmoid colon  diverticula in that region without evidence of diverticulitis. Unremarkable small bowel. Surgically absent appendix. 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 Inferior to the ostomy, large ventral hernia containing herniated stomach, colon and small bowel is again demonstrated. No evidence of obstruction. Lymphatic: No enlarged lymph nodes. Reproductive: Status post hysterectomy. No adnexal masses. Other: Stable large ventral hernia containing herniated stomach, small bowel and colon without obstruction. An upper abdominal surgical drain is in place with no adjacent fluid. Musculoskeletal: Extensive lumbar and thoracic spine degenerative changes. IMPRESSION: 1. No active contrast extravasation seen in the stomach or bowel. 2. Interval diffuse low-density gastric wall thickening with mucosal enhancement, most pronounced involving the gastric antrum. This is consistent with gastritis. 3. Moderate-sized right pleural effusion and small to moderate-sized left pleural effusion with associated bilateral lower lobe compressive atelectasis. 4. Mild cardiomegaly. 5. 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 6. 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 7. Interval minimal dilatation of the left renal collecting system to the level of the ureteropelvic junction with no obstructing stone or mass seen. This could be due to a recently passed stone or a UPJ obstruction. 8. Stable large ventral hernia containing herniated stomach, small bowel and colon without obstruction. 9. Status post partial colectomy including the proximal sigmoid colon. Enteric contrast in the excluded distal sigmoid colon and rectum, unchanged on the precontrast and postcontrast images. Multiple sigmoid colon diverticula in that region without evidence of diverticulitis. 10. 7 mm nonobstructing calculus in the upper left renal pelvis. 11. Aortic atherosclerosis.  Aortic Atherosclerosis (ICD10-I70.0). Electronically Signed   By: Elspeth Bathe M.D.   On: 06/24/2024 15:00     LOS: 19 days   Donalda Applebaum, MD  [facility]    To contact the attending provider between 7A-7P or the covering provider during after hours 7P-7A, please log into the web site [email or web address] and access using universal [place] password for that web site. If you do not have the password, please call the hospital operator.  06/26/2024, 10:55 AM

## 2024-06-26 NOTE — Progress Notes (Signed)
 Occupational Therapy Treatment Patient Details Name: Christina Castaneda MRN: [id number] DOB: 1956-04-19 Today's Date: 06/26/2024   History of present illness Pt is a 68 y/o F presenting to [facility] ED on 6/15 after fall, found to have L femoral neck fx and AKI. S/p retrograde IMN on 6/20. Hgb drop on 6/23 with dark ostomy output, transfused, subsequent hypotension requiring pressors. Found to have perforated bleeding duodenal ulcer s/p ex lap on 6/23. Transferred to [facility] for PCCM. 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   OT comments  Pt agreeable to UE exercises with level one theraband at bed level. Completed B shoulder FF and horizontal abduction x 10. Pt declined ADLs and any mobility. Highly focused on being thirsty, currently NPO. Left theraband at bedside for pt to use daily.       If plan is discharge home, recommend the following:  Two people to help with walking and/or transfers;Two people to help with bathing/dressing/bathroom;Assistance with cooking/housework;Assist for transportation;Help with stairs or ramp for entrance   Equipment Recommendations  Other (comment) (defer to next venue)    Recommendations for Other Services      Precautions / Restrictions Precautions Precautions: Fall;Other (comment) Precaution/Restrictions Comments: JP drain, colostomy, sacral wound Required Braces or Orthoses: Knee Immobilizer - Left Restrictions Weight Bearing Restrictions Per Provider Order: Yes LLE Weight Bearing Per Provider Order: Weight bearing as tolerated       Mobility Bed Mobility                    Transfers                   General transfer comment: declined OOB to chair or repositioning in bed     Balance                                           ADL either performed or assessed with clinical judgement   ADL                                         General ADL  Comments: declined ADL training    Extremity/Trunk Assessment              Vision       Perception     Praxis     Communication Communication Communication: No apparent difficulties   Cognition Arousal: Alert Behavior During Therapy: Flat affect Cognition: No apparent impairments             OT - Cognition Comments: pt annoyed she cannot get ice chips, awaiting dr's orders per RN                 Following commands: Intact        Cueing   Cueing Techniques: Verbal cues  Exercises Exercises: General Upper Extremity General Exercises - Upper Extremity Shoulder Flexion: Strengthening, Both, 10 reps, Supine, Theraband Theraband Level (Shoulder Flexion): Level 1 (Yellow) Shoulder Horizontal ABduction: Strengthening, 10 reps, Supine, Theraband Theraband Level (Shoulder Horizontal Abduction): Level 1 (Yellow)    Shoulder Instructions       General Comments      Pertinent Vitals/ Pain       Pain Assessment Pain Assessment: No/denies pain  Home Living  Prior Functioning/Environment              Frequency  Min 2X/week        Progress Toward Goals  OT Goals(current goals can now be found in the care plan section)  Progress towards OT goals: Progressing toward goals  Acute Rehab OT Goals OT Goal Formulation: With patient Time For Goal Achievement: 07/01/24 Potential to Achieve Goals: Fair  Plan      Co-evaluation                 AM-PAC OT 6 Clicks Daily Activity     Outcome Measure   Help from another person eating meals?: A Little Help from another person taking care of personal grooming?: A Little Help from another person toileting, which includes using toliet, bedpan, or urinal?: Total Help from another person bathing (including washing, rinsing, drying)?: A Lot Help from another person to put on and taking off regular upper body clothing?: A Lot Help from  another person to put on and taking off regular lower body clothing?: Total 6 Click Score: 12    End of Session Equipment Utilized During Treatment: Oxygen  OT Visit Diagnosis: Unsteadiness on feet (R26.81);Other abnormalities of gait and mobility (R26.89);Muscle weakness (generalized) (M62.81)   Activity Tolerance Patient limited by fatigue   Patient Left in bed;with call bell/phone within reach   Nurse Communication Other (comment) (pt is NPO)        Time: 9067-9050 OT Time Calculation (min): 17 min  Charges: OT General Charges $OT Visit: 1 Visit OT Treatments $Therapeutic Exercise: 8-22 mins  Mliss HERO, OTR/L Acute Rehabilitation Services Office: [phone]   Kennth Mliss Helling 06/26/2024, 11:28 AM

## 2024-06-26 NOTE — TOC Progression Note (Signed)
 Transition of Care [facility]) - Progression Note    Patient Details  Name: Christina Castaneda MRN: [id number] Date of Birth: 1956/05/09  [facility] CM/SW Contact  Bridget Cordella Simmonds, LCSW Phone Number: 06/26/2024, 8:50 AM  Clinical Narrative:   Per MD, not stable for DC today.  SNF updated.      Expected Discharge Plan: Skilled Nursing Facility Barriers to Discharge: Continued Medical Work up, Other (must enter comment) (SNF auth pending)  Expected Discharge Plan and Services In-house Referral: Clinical Social Work Discharge Planning Services: CM Consult Post Acute Care Choice: Skilled Nursing Facility Living arrangements for the past 2 months: Single Family Home                                       Social Determinants of Health (SDOH) Interventions SDOH Screenings   Food Insecurity: No Food Insecurity (06/08/2024)  Housing: Low Risk  (06/08/2024)  Transportation Needs: No Transportation Needs (06/08/2024)  Utilities: Not At Risk (06/08/2024)  Social Connections: Unknown (06/08/2024)  Recent Concern: Social Connections - Moderately Isolated (05/27/2024)  Tobacco Use: High Risk (06/25/2024)    Readmission Risk Interventions    05/27/2024   10:01 PM  Readmission Risk Prevention Plan  Post Dischage Appt Complete  Medication Screening Complete  Transportation Screening Complete

## 2024-06-26 NOTE — Progress Notes (Signed)
 PT Cancellation Note  Patient Details Name: Christina Castaneda MRN: [id number] DOB: Oct 21, 1956   Cancelled Treatment:    Reason Eval/Treat Not Completed: Patient declined, no reason specified (Pt refused stating that They just put me back together yesterday after being up in that chair messed my stomach up. Will follow up later if time allows.)   Scotlyn Mccranie 06/26/2024, 11:49 AM

## 2024-06-26 NOTE — Progress Notes (Signed)
 Physical Therapy Wound Treatment Patient Details  Name: Christina Castaneda MRN: [id number] Date of Birth: 15-Apr-1956  Today's Date: 06/26/2024 Time: 1205-1245 Time Calculation (min): 40 min  Subjective  Subjective Assessment Subjective: Pt worried about GI bleeding Patient and Family Stated Goals: go home Date of Onset:  (unknown) Prior Treatments: cleansing, Dakins soaked gauze, covered by sacral foam  Pain Score:  9/10  Wound Assessment  Wound 06/08/24 1104 Pressure Injury Coccyx Medial Stage 3 -  Full thickness tissue loss. Subcutaneous fat may be visible but bone, tendon or muscle are NOT exposed. (Active)  Wound Image   06/24/24 1400  Site / Wound Assessment Pink;Red;Yellow;Friable;Granulation tissue 06/26/24 1300  Peri-wound Assessment Erythema (blanchable);Maceration;Pink;Denuded 06/26/24 1300  Wound Length (cm) 6 cm 06/24/24 1400  Wound Width (cm) 4 cm 06/24/24 1400  Wound Surface Area (cm^2) 18.85 cm^2 06/24/24 1400  Wound Depth (cm) 3 cm 06/24/24 1400  Wound Volume (cm^3) 37.699 cm^3 06/24/24 1400  Drainage Description Serous;Purulent 06/26/24 1300  Drainage Amount Moderate 06/26/24 1300  Treatment Cleansed;Off loading;Irrigation 06/26/24 1300  Dressing Type ABD;Dakin's-soaked gauze;Barrier Film (skin prep) 06/26/24 1300  Dressing Changed Changed 06/26/24 1300  Dressing Status Clean, Dry, Intact 06/26/24 1300  State of Healing Early/partial granulation 06/26/24 1300  % Wound base Red or Granulating 30% 06/26/24 1300  % Wound base Yellow/Fibrinous Exudate 70% 06/26/24 1300  % Wound base Black/Eschar 0% 06/15/24 0735  % Wound base Other/Granulation Tissue (Comment) 80% 06/15/24 0735  Margins Unattached edges (unapproximated) 06/26/24 1300           Wound Assessment and Plan  Wound Therapy - Assess/Plan/Recommendations Wound Therapy - Clinical Statement: Pt initially refuses. PT provides education on difference between RN dressing change and wound threapy. Pt agreeable  to debridement if she gets IV pain meds. RN provides before treatment. Wound continues to have significant amount of friable slough. Able to debride around edge of wound. Wound Therapy - Functional Problem List: decrease mobility Factors Delaying/Impairing Wound Healing: Immobility, Multiple medical problems, Polypharmacy, Vascular compromise, Incontinence Hydrotherapy Plan: Debridement, Dressing change, Patient/family education Wound Therapy - Frequency: 2X / week Wound Therapy - Follow Up Recommendations: dressing changes by RN  Wound Therapy Goals- Improve the function of patient's integumentary system by progressing the wound(s) through the phases of wound healing (inflammation - proliferation - remodeling) by: Wound Therapy Goals - Improve the function of patient's integumentary system by progressing the wound(s) through the phases of wound healing by: Decrease Necrotic Tissue to: 50 Decrease Necrotic Tissue - Progress: Progressing toward goal Increase Granulation Tissue to: 50 Increase Granulation Tissue - Progress: Progressing toward goal Improve Drainage Characteristics: Min Improve Drainage Characteristics - Progress: Goal set today Goals/treatment plan/discharge plan were made with and agreed upon by patient/family: Yes Time For Goal Achievement: 7 days Wound Therapy - Potential for Goals: Fair  Goals will be updated until maximal potential achieved or discharge criteria met.  Discharge criteria: when goals achieved, discharge from hospital, MD decision/surgical intervention, no progress towards goals, refusal/missing three consecutive treatments without notification or medical reason.  GP     Charges PT Wound Care Charges $Wound Debridement up to 20 cm: < or equal to 20 cm $PT Hydrotherapy Dressing: 2 dressings $PT Hydrotherapy Cleanse Choice Dressing: 1 Supply     Christina Castaneda PT, DPT Acute Rehabilitation Services Please use secure chat or  Call Office [phone]   Christina Castaneda 06/26/2024, 1:41 PM

## 2024-06-26 NOTE — Plan of Care (Signed)
  Problem: Education: Goal: Knowledge of General Education information will improve Description: Including pain rating scale, medication(s)/side effects and non-pharmacologic comfort measures Outcome: Progressing   Problem: Health Behavior/Discharge Planning: Goal: Ability to manage health-related needs will improve Outcome: Progressing   Problem: Clinical Measurements: Goal: Ability to maintain clinical measurements within normal limits will improve Outcome: Progressing Goal: Will remain free from infection Outcome: Progressing Goal: Diagnostic test results will improve Outcome: Progressing Goal: Respiratory complications will improve Outcome: Progressing Goal: Cardiovascular complication will be avoided Outcome: Progressing   Problem: Activity: Goal: Risk for activity intolerance will decrease Outcome: Progressing   Problem: Nutrition: Goal: Adequate nutrition will be maintained Outcome: Progressing   Problem: Coping: Goal: Level of anxiety will decrease Outcome: Progressing   Problem: Elimination: Goal: Will not experience complications related to bowel motility Outcome: Progressing Goal: Will not experience complications related to urinary retention Outcome: Progressing   Problem: Pain Managment: Goal: General experience of comfort will improve and/or be controlled Outcome: Progressing   Problem: Safety: Goal: Ability to remain free from injury will improve Outcome: Progressing   Problem: Skin Integrity: Goal: Risk for impaired skin integrity will decrease Outcome: Progressing   Problem: Safety: Goal: Non-violent Restraint(s) Outcome: Progressing   Problem: Activity: Goal: Ability to tolerate increased activity will improve Outcome: Progressing   Problem: Respiratory: Goal: Ability to maintain a clear airway and adequate ventilation will improve Outcome: Progressing   Problem: Role Relationship: Goal: Method of communication will improve Outcome:  Progressing   Problem: Education: Goal: Understanding of CV disease, CV risk reduction, and recovery process will improve Outcome: Progressing Goal: Individualized Educational Video(s) Outcome: Progressing   Problem: Activity: Goal: Ability to return to baseline activity level will improve Outcome: Progressing   Problem: Cardiovascular: Goal: Ability to achieve and maintain adequate cardiovascular perfusion will improve Outcome: Progressing Goal: Vascular access site(s) Level 0-1 will be maintained Outcome: Progressing   Problem: Health Behavior/Discharge Planning: Goal: Ability to safely manage health-related needs after discharge will improve Outcome: Progressing

## 2024-06-26 NOTE — Progress Notes (Signed)
 11 Days Post-Op   Subjective/Chief Complaint: PT WANTS SOMETHING TO DRINK  NOT GETTING PAIN MEDS    Objective: Vital signs in last 24 hours: Temp:  [97.5 F (36.4 C)-98.4 F (36.9 C)] 97.6 F (36.4 C) (07/04 0800) Pulse Rate:  [48-68] 62 (07/04 0800) Resp:  [11-24] 20 (07/04 0800) BP: (92-125)/(39-93) 120/62 (07/04 0800) SpO2:  [92 %-100 %] 94 % (07/04 0800) Weight:  [887 kg] 112 kg (07/04 0541) Last BM Date : 06/25/24  Intake/Output from previous day: 07/03 0701 - 07/04 0700 In: 538.4 [I.V.:223.4; Blood:315] Out: 2095 [Urine:1850; Drains:85; Stool:160] Intake/Output this shift: No intake/output data recorded.  Abd: soft, appropriately tender, midline wound clean and honeycomb in place, JP with serosang output, colostomy with melena present.   Lab Results:  Recent Labs    06/25/24 1541 06/26/24 0445  WBC 7.2 8.5  HGB 9.2* 8.5*  HCT 27.8* 26.0*  PLT 195 190   BMET Recent Labs    06/25/24 0355 06/26/24 0445  NA 133* 136  K 3.8 3.6  CL 100 103  CO2 26 24  GLUCOSE 90 76  BUN 52* 40*  CREATININE 1.05* 1.18*  CALCIUM  8.2* 7.8*   PT/INR No results for input(s): LABPROT, INR in the last 72 hours. ABG No results for input(s): PHART, HCO3 in the last 72 hours.  Invalid input(s): PCO2, PO2  Studies/Results: IR Transcath/Emboliz Result Date: 06/26/2024 INDICATION: Refractory upper GI bleed s/p endoscopy. EXAM: Title; COIL EMBOLIZATION OF GASTRODUODENAL ARTERY Listed procedures; 1.  ULTRASOUND GUIDANCE FOR ARTERIAL ACCESS 2. MESENTERIC ARTERIOGRAPHY, INCLUDING CELIAC, COMMON HEPATIC and GASTRODUODENAL ARTERIOGRAMS 3.  COIL EMBOLIZATION OF GASTRODUODENAL ARTERY COMPARISON:  CTA abdomen pelvis, 06/24/2024 MEDICATIONS: 4 mg Zofran  IV.  50 mg Benadryl  IV. ANESTHESIA/SEDATION: Moderate (conscious) sedation was employed during this procedure. A total of Versed  1.5 mg and Fentanyl  75 mcg was administered intravenously. Moderate Sedation Time: 89 minutes. The  patient's level of consciousness and vital signs were monitored continuously by radiology nursing throughout the procedure under my direct supervision. CONTRAST:  100 mL Omnipaque  300 FLUOROSCOPY: Radiation Exposure Index and estimated peak skin dose (PSD); Reference air kerma (RAK), 448.5 mGy. Kerma-area product (KAP), 7499.3 uGy*m. COMPLICATIONS: None immediate. PROCEDURE: Informed consent was obtained from the patient and/or patient's representative following explanation of the procedure, risks, benefits and alternatives. All questions were addressed. A time out was performed prior to the initiation of the procedure. Maximal barrier sterile technique utilized including caps, mask, sterile gowns, sterile gloves, large sterile drape, hand hygiene, and chlorhexidine  prep. The RIGHT femoral head was marked fluoroscopically. Under sterile conditions and local anesthesia, the RIGHT common femoral artery access was performed with a micropuncture needle. Under direct ultrasound guidance, the RIGHT common femoral was accessed with a micropuncture kit. An ultrasound image was saved for documentation purposes. This allowed for placement of a 5 Fr 35 cm vascular sheath. A limited arteriogram was performed through the side arm of the sheath confirming appropriate access within the RIGHT common femoral artery. A limited abdominal aortogram was performed to help identify the celiac artery origin. Over a Bentson wire, a C2 catheter was advanced, back bled and flushed. The catheter was then utilized to select the celiac access, then advanced into the common hepatic then gastroduodenal arteries. Selective mesenteric arteriograms were performed at each level. Initial coil deployment was performed with the 5 Fr catheter within the gastroduodenal artery, then a 6 mm 0.035 inch Terumo Azure was attempted to be deployed, however due deployment mechanism fraying was noted on reposition  attempt. The 5 Fr sheath was exchanged for an 8 Fr  35 cm vascular sheath, then a 6 Fr snare was deployed. The frayed deployment mechanism was ensnared, and the mechanism and coil were removed intact. The C2 catheter was readvanced and utilized to select the celiac axis, and finally back into the GDA. Using a 2.4 Fr microcatheter and 0.016 inch Fathom microwire access into the gastroduodenal artery was performed and a selective arteriogram was performed. Selective embolization with multiple 0.018 inch micro coils was performed. The microcatheter was removed and arteriogram with the 5 Fr catheter at the common hepatic artery was performed. Adequate pruning of the GDA feeder arteries was achieved on post embolization arteriogram. Images were reviewed and the procedure was terminated. All wires, catheters and sheaths were removed from the patient. Hemostasis was achieved at the RIGHT groin access site with Angio-Seal closure device. The patient tolerated the procedure well without immediate post procedural complication. FINDINGS: *Celiac and common hepatic arteriograms with normal order and branching. *Gastroduodenal arteriogram without active extravasation noted. *Embolization with adequate pruning of the GDA distribution arteries. *Access via the RIGHT femoral artery. Angio-Seal closure at RIGHT groin. Palpable RLE pulses at the end of the Case IMPRESSION: 1. 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 2. Successful empiric coil embolization of the gastroduodenal artery for refractory upper GI bleed. PLAN: - The patient is to remain flat for 2 hours with RIGHT leg straight. - The patient may continue to experience residual melena however should resolve in the coming days. Thom Hall, MD [facility] Radiology Electronically Signed   By: Thom Hall M.D.   On: 06/26/2024 10:26   IR US  Guide Vasc Access Right Result Date: 06/26/2024 INDICATION: Refractory upper GI  bleed s/p endoscopy. EXAM: Title; COIL EMBOLIZATION OF GASTRODUODENAL ARTERY Listed procedures; 1.  ULTRASOUND GUIDANCE FOR ARTERIAL ACCESS 2. MESENTERIC ARTERIOGRAPHY, INCLUDING CELIAC, COMMON HEPATIC and GASTRODUODENAL ARTERIOGRAMS 3.  COIL EMBOLIZATION OF GASTRODUODENAL ARTERY COMPARISON:  CTA abdomen pelvis, 06/24/2024 MEDICATIONS: 4 mg Zofran  IV.  50 mg Benadryl  IV. ANESTHESIA/SEDATION: Moderate (conscious) sedation was employed during this procedure. A total of Versed  1.5 mg and Fentanyl  75 mcg was administered intravenously. Moderate Sedation Time: 89 minutes. The patient's level of consciousness and vital signs were monitored continuously by radiology nursing throughout the procedure under my direct supervision. CONTRAST:  100 mL Omnipaque  300 FLUOROSCOPY: Radiation Exposure Index and estimated peak skin dose (PSD); Reference air kerma (RAK), 448.5 mGy. Kerma-area product (KAP), 7499.3 uGy*m. COMPLICATIONS: None immediate. PROCEDURE: Informed consent was obtained from the patient and/or patient's representative following explanation of the procedure, risks, benefits and alternatives. All questions were addressed. A time out was performed prior to the initiation of the procedure. Maximal barrier sterile technique utilized including caps, mask, sterile gowns, sterile gloves, large sterile drape, hand hygiene, and chlorhexidine  prep. The RIGHT femoral head was marked fluoroscopically. Under sterile conditions and local anesthesia, the RIGHT common femoral artery access was performed with a micropuncture needle. Under direct ultrasound guidance, the RIGHT common femoral was accessed with a micropuncture kit. An ultrasound image was saved for documentation purposes. This allowed for placement of a 5 Fr 35 cm vascular sheath. A limited arteriogram was performed through the side arm of the sheath confirming appropriate access within the RIGHT common femoral artery. A limited abdominal aortogram was performed to  help identify the celiac artery origin. Over a Bentson wire, a C2 catheter was advanced, back bled and flushed. The catheter  was then utilized to select the celiac access, then advanced into the common hepatic then gastroduodenal arteries. Selective mesenteric arteriograms were performed at each level. Initial coil deployment was performed with the 5 Fr catheter within the gastroduodenal artery, then a 6 mm 0.035 inch Terumo Azure was attempted to be deployed, however due deployment mechanism fraying was noted on reposition attempt. The 5 Fr sheath was exchanged for an 8 Fr 35 cm vascular sheath, then a 6 Fr snare was deployed. The frayed deployment mechanism was ensnared, and the mechanism and coil were removed intact. The C2 catheter was readvanced and utilized to select the celiac axis, and finally back into the GDA. Using a 2.4 Fr microcatheter and 0.016 inch Fathom microwire access into the gastroduodenal artery was performed and a selective arteriogram was performed. Selective embolization with multiple 0.018 inch micro coils was performed. The microcatheter was removed and arteriogram with the 5 Fr catheter at the common hepatic artery was performed. Adequate pruning of the GDA feeder arteries was achieved on post embolization arteriogram. Images were reviewed and the procedure was terminated. All wires, catheters and sheaths were removed from the patient. Hemostasis was achieved at the RIGHT groin access site with Angio-Seal closure device. The patient tolerated the procedure well without immediate post procedural complication. FINDINGS: *Celiac and common hepatic arteriograms with normal order and branching. *Gastroduodenal arteriogram without active extravasation noted. *Embolization with adequate pruning of the GDA distribution arteries. *Access via the RIGHT femoral artery. Angio-Seal closure at RIGHT groin. Palpable RLE pulses at the end of the Case IMPRESSION: 1. Mesenteric arteriography without  angiographic evidence of active extravasation at the duodenum. 2. Successful empiric coil embolization of the gastroduodenal artery for refractory upper GI bleed. PLAN: - The patient is to remain flat for 2 hours with RIGHT leg straight. - The patient may continue to experience residual melena however should resolve in the coming days. Thom Hall, MD [facility] Radiology Electronically Signed   By: Thom Hall M.D.   On: 06/26/2024 10:26   IR Angiogram Visceral Selective Result Date: 06/26/2024 INDICATION: Refractory upper GI bleed s/p endoscopy. EXAM: Title; COIL EMBOLIZATION OF GASTRODUODENAL ARTERY Listed procedures; 1.  ULTRASOUND GUIDANCE FOR ARTERIAL ACCESS 2. MESENTERIC ARTERIOGRAPHY, INCLUDING CELIAC, COMMON HEPATIC and GASTRODUODENAL ARTERIOGRAMS 3.  COIL EMBOLIZATION OF GASTRODUODENAL ARTERY COMPARISON:  CTA abdomen pelvis, 06/24/2024 MEDICATIONS: 4 mg Zofran  IV.  50 mg Benadryl  IV. ANESTHESIA/SEDATION: Moderate (conscious) sedation was employed during this procedure. A total of Versed  1.5 mg and Fentanyl  75 mcg was administered intravenously. Moderate Sedation Time: 89 minutes. The patient's level of consciousness and vital signs were monitored continuously by radiology nursing throughout the procedure under my direct supervision. CONTRAST:  100 mL Omnipaque  300 FLUOROSCOPY: Radiation Exposure Index and estimated peak skin dose (PSD); Reference air kerma (RAK), 448.5 mGy. Kerma-area product (KAP), 7499.3 uGy*m. COMPLICATIONS: None immediate. PROCEDURE: Informed consent was obtained from the patient and/or patient's representative following explanation of the procedure, risks, benefits and alternatives. All questions were addressed. A time out was performed prior to the initiation of the procedure. Maximal barrier sterile technique utilized including caps, mask, sterile gowns, sterile gloves, large sterile drape, hand hygiene, and chlorhexidine  prep.  The RIGHT femoral head was marked fluoroscopically. Under sterile conditions and local anesthesia, the RIGHT common femoral artery access was performed with a micropuncture needle. Under direct ultrasound guidance, the RIGHT common femoral was accessed with a micropuncture kit. An ultrasound image was saved for documentation purposes. This allowed for  placement of a 5 Fr 35 cm vascular sheath. A limited arteriogram was performed through the side arm of the sheath confirming appropriate access within the RIGHT common femoral artery. A limited abdominal aortogram was performed to help identify the celiac artery origin. Over a Bentson wire, a C2 catheter was advanced, back bled and flushed. The catheter was then utilized to select the celiac access, then advanced into the common hepatic then gastroduodenal arteries. Selective mesenteric arteriograms were performed at each level. Initial coil deployment was performed with the 5 Fr catheter within the gastroduodenal artery, then a 6 mm 0.035 inch Terumo Azure was attempted to be deployed, however due deployment mechanism fraying was noted on reposition attempt. The 5 Fr sheath was exchanged for an 8 Fr 35 cm vascular sheath, then a 6 Fr snare was deployed. The frayed deployment mechanism was ensnared, and the mechanism and coil were removed intact. The C2 catheter was readvanced and utilized to select the celiac axis, and finally back into the GDA. Using a 2.4 Fr microcatheter and 0.016 inch Fathom microwire access into the gastroduodenal artery was performed and a selective arteriogram was performed. Selective embolization with multiple 0.018 inch micro coils was performed. The microcatheter was removed and arteriogram with the 5 Fr catheter at the common hepatic artery was performed. Adequate pruning of the GDA feeder arteries was achieved on post embolization arteriogram. Images were reviewed and the procedure was terminated. All wires, catheters and sheaths were  removed from the patient. Hemostasis was achieved at the RIGHT groin access site with Angio-Seal closure device. The patient tolerated the procedure well without immediate post procedural complication. FINDINGS: *Celiac and common hepatic arteriograms with normal order and branching. *Gastroduodenal arteriogram without active extravasation noted. *Embolization with adequate pruning of the GDA distribution arteries. *Access via the RIGHT femoral artery. Angio-Seal closure at RIGHT groin. Palpable RLE pulses at the end of the Case IMPRESSION: 1. 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 2. Successful empiric coil embolization of the gastroduodenal artery for refractory upper GI bleed. PLAN: - The patient is to remain flat for 2 hours with RIGHT leg straight. - The patient may continue to experience residual melena however should resolve in the coming days. Thom Hall, MD [facility] Radiology Electronically Signed   By: Thom Hall M.D.   On: 06/26/2024 10:26   IR Angiogram Visceral Selective Result Date: 06/26/2024 INDICATION: Refractory upper GI bleed s/p endoscopy. EXAM: Title; COIL EMBOLIZATION OF GASTRODUODENAL ARTERY Listed procedures; 1.  ULTRASOUND GUIDANCE FOR ARTERIAL ACCESS 2. MESENTERIC ARTERIOGRAPHY, INCLUDING CELIAC, COMMON HEPATIC and GASTRODUODENAL ARTERIOGRAMS 3.  COIL EMBOLIZATION OF GASTRODUODENAL ARTERY COMPARISON:  CTA abdomen pelvis, 06/24/2024 MEDICATIONS: 4 mg Zofran  IV.  50 mg Benadryl  IV. ANESTHESIA/SEDATION: Moderate (conscious) sedation was employed during this procedure. A total of Versed  1.5 mg and Fentanyl  75 mcg was administered intravenously. Moderate Sedation Time: 89 minutes. The patient's level of consciousness and vital signs were monitored continuously by radiology nursing throughout the procedure under my direct supervision. CONTRAST:  100 mL Omnipaque  300 FLUOROSCOPY:  Radiation Exposure Index and estimated peak skin dose (PSD); Reference air kerma (RAK), 448.5 mGy. Kerma-area product (KAP), 7499.3 uGy*m. COMPLICATIONS: None immediate. PROCEDURE: Informed consent was obtained from the patient and/or patient's representative following explanation of the procedure, risks, benefits and alternatives. All questions were addressed. A time out was performed prior to the initiation of the procedure. Maximal barrier sterile technique utilized including caps, mask, sterile gowns, sterile gloves,  large sterile drape, hand hygiene, and chlorhexidine  prep. The RIGHT femoral head was marked fluoroscopically. Under sterile conditions and local anesthesia, the RIGHT common femoral artery access was performed with a micropuncture needle. Under direct ultrasound guidance, the RIGHT common femoral was accessed with a micropuncture kit. An ultrasound image was saved for documentation purposes. This allowed for placement of a 5 Fr 35 cm vascular sheath. A limited arteriogram was performed through the side arm of the sheath confirming appropriate access within the RIGHT common femoral artery. A limited abdominal aortogram was performed to help identify the celiac artery origin. Over a Bentson wire, a C2 catheter was advanced, back bled and flushed. The catheter was then utilized to select the celiac access, then advanced into the common hepatic then gastroduodenal arteries. Selective mesenteric arteriograms were performed at each level. Initial coil deployment was performed with the 5 Fr catheter within the gastroduodenal artery, then a 6 mm 0.035 inch Terumo Azure was attempted to be deployed, however due deployment mechanism fraying was noted on reposition attempt. The 5 Fr sheath was exchanged for an 8 Fr 35 cm vascular sheath, then a 6 Fr snare was deployed. The frayed deployment mechanism was ensnared, and the mechanism and coil were removed intact. The C2 catheter was readvanced and utilized to  select the celiac axis, and finally back into the GDA. Using a 2.4 Fr microcatheter and 0.016 inch Fathom microwire access into the gastroduodenal artery was performed and a selective arteriogram was performed. Selective embolization with multiple 0.018 inch micro coils was performed. The microcatheter was removed and arteriogram with the 5 Fr catheter at the common hepatic artery was performed. Adequate pruning of the GDA feeder arteries was achieved on post embolization arteriogram. Images were reviewed and the procedure was terminated. All wires, catheters and sheaths were removed from the patient. Hemostasis was achieved at the RIGHT groin access site with Angio-Seal closure device. The patient tolerated the procedure well without immediate post procedural complication. FINDINGS: *Celiac and common hepatic arteriograms with normal order and branching. *Gastroduodenal arteriogram without active extravasation noted. *Embolization with adequate pruning of the GDA distribution arteries. *Access via the RIGHT femoral artery. Angio-Seal closure at RIGHT groin. Palpable RLE pulses at the end of the Case IMPRESSION: 1. 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 2. Successful empiric coil embolization of the gastroduodenal artery for refractory upper GI bleed. PLAN: - The patient is to remain flat for 2 hours with RIGHT leg straight. - The patient may continue to experience residual melena however should resolve in the coming days. Thom Hall, MD [facility] Radiology Electronically Signed   By: Thom Hall M.D.   On: 06/26/2024 10:26   IR Angiogram Visceral Selective Result Date: 06/26/2024 INDICATION: Refractory upper GI bleed s/p endoscopy. EXAM: Title; COIL EMBOLIZATION OF GASTRODUODENAL ARTERY Listed procedures; 1.  ULTRASOUND GUIDANCE FOR ARTERIAL ACCESS 2. MESENTERIC ARTERIOGRAPHY, INCLUDING CELIAC, COMMON  HEPATIC and GASTRODUODENAL ARTERIOGRAMS 3.  COIL EMBOLIZATION OF GASTRODUODENAL ARTERY COMPARISON:  CTA abdomen pelvis, 06/24/2024 MEDICATIONS: 4 mg Zofran  IV.  50 mg Benadryl  IV. ANESTHESIA/SEDATION: Moderate (conscious) sedation was employed during this procedure. A total of Versed  1.5 mg and Fentanyl  75 mcg was administered intravenously. Moderate Sedation Time: 89 minutes. The patient's level of consciousness and vital signs were monitored continuously by radiology nursing throughout the procedure under my direct supervision. CONTRAST:  100 mL Omnipaque  300 FLUOROSCOPY: Radiation Exposure Index and estimated peak skin dose (PSD); Reference air kerma (RAK),  448.5 mGy. Kerma-area product (KAP), 7499.3 uGy*m. COMPLICATIONS: None immediate. PROCEDURE: Informed consent was obtained from the patient and/or patient's representative following explanation of the procedure, risks, benefits and alternatives. All questions were addressed. A time out was performed prior to the initiation of the procedure. Maximal barrier sterile technique utilized including caps, mask, sterile gowns, sterile gloves, large sterile drape, hand hygiene, and chlorhexidine  prep. The RIGHT femoral head was marked fluoroscopically. Under sterile conditions and local anesthesia, the RIGHT common femoral artery access was performed with a micropuncture needle. Under direct ultrasound guidance, the RIGHT common femoral was accessed with a micropuncture kit. An ultrasound image was saved for documentation purposes. This allowed for placement of a 5 Fr 35 cm vascular sheath. A limited arteriogram was performed through the side arm of the sheath confirming appropriate access within the RIGHT common femoral artery. A limited abdominal aortogram was performed to help identify the celiac artery origin. Over a Bentson wire, a C2 catheter was advanced, back bled and flushed. The catheter was then utilized to select the celiac access, then advanced into the  common hepatic then gastroduodenal arteries. Selective mesenteric arteriograms were performed at each level. Initial coil deployment was performed with the 5 Fr catheter within the gastroduodenal artery, then a 6 mm 0.035 inch Terumo Azure was attempted to be deployed, however due deployment mechanism fraying was noted on reposition attempt. The 5 Fr sheath was exchanged for an 8 Fr 35 cm vascular sheath, then a 6 Fr snare was deployed. The frayed deployment mechanism was ensnared, and the mechanism and coil were removed intact. The C2 catheter was readvanced and utilized to select the celiac axis, and finally back into the GDA. Using a 2.4 Fr microcatheter and 0.016 inch Fathom microwire access into the gastroduodenal artery was performed and a selective arteriogram was performed. Selective embolization with multiple 0.018 inch micro coils was performed. The microcatheter was removed and arteriogram with the 5 Fr catheter at the common hepatic artery was performed. Adequate pruning of the GDA feeder arteries was achieved on post embolization arteriogram. Images were reviewed and the procedure was terminated. All wires, catheters and sheaths were removed from the patient. Hemostasis was achieved at the RIGHT groin access site with Angio-Seal closure device. The patient tolerated the procedure well without immediate post procedural complication. FINDINGS: *Celiac and common hepatic arteriograms with normal order and branching. *Gastroduodenal arteriogram without active extravasation noted. *Embolization with adequate pruning of the GDA distribution arteries. *Access via the RIGHT femoral artery. Angio-Seal closure at RIGHT groin. Palpable RLE pulses at the end of the Case IMPRESSION: 1. 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 2. Successful empiric coil embolization of the gastroduodenal artery for refractory upper GI bleed. PLAN: - The patient is to remain flat  for 2 hours with RIGHT leg straight. - The patient may continue to experience residual melena however should resolve in the coming days. Thom Hall, MD [facility] Radiology Electronically Signed   By: Thom Hall M.D.   On: 06/26/2024 10:26   CT ANGIO GI BLEED Result Date: 06/24/2024 CLINICAL DATA:  Melena with acute blood-loss and anemia. Status post exploratory laparotomy and Arlyss patch placement for duodenitis and duodenal ulcer with perforation on 06/15/2024. Clinical concern for determining if there is active gastrointestinal bleeding. EXAM: CTA ABDOMEN AND PELVIS WITHOUT AND WITH CONTRAST TECHNIQUE: Multidetector CT imaging of the abdomen and pelvis was performed using the standard protocol during bolus administration of intravenous contrast.  Multiplanar reconstructed images and MIPs were obtained and reviewed to evaluate the vascular anatomy. RADIATION DOSE REDUCTION: This exam was performed according to the departmental dose-optimization program which includes automated exposure control, adjustment of the mA and/or kV according to patient size and/or use of iterative reconstruction technique. CONTRAST:  OMNIPAQUE  IOHEXOL  350 MG/ML SOLN COMPARISON:  Abdomen and pelvis CT dated 06/15/2024. FINDINGS: VASCULAR Aorta: Atheromatous changes without stenosis, aneurysm or dissection. Celiac: Patent without evidence of aneurysm, dissection, vasculitis or significant stenosis. SMA: Patent without evidence of aneurysm, dissection, vasculitis or significant stenosis. Renals: 2 right renal arteries and 1 left renal artery. There is some plaque formation in the proximal, upper right renal artery producing approximately 50% stenosis. No plaque formation or stenosis involving the more inferior right renal artery. Calcified plaque at the origin of the left renal artery with a proximally 70% stenosis. IMA: Calcified plaque throughout the inferior aspect of the  abdominal aorta without opacification of the inferior mesenteric artery. Inflow: Bilateral calcified plaque formation without aneurysm or significant stenosis. Proximal Outflow: Bilateral calcified plaque formation without aneurysm or significant stenosis. Veins: No obvious venous abnormality within the limitations of this arterial phase study. Review of the MIP images confirms the above findings. NON-VASCULAR Lower chest: Moderate-sized right pleural effusion and small to moderate-sized left pleural effusion. Associated bilateral lower lobe compressive atelectasis. The included portion of the heart is mildly enlarged with no pericardial effusion seen. Hepatobiliary: No focal liver abnormality is seen. Status post cholecystectomy. No biliary dilatation. Pancreas: Unremarkable. No pancreatic ductal dilatation or surrounding inflammatory changes. Spleen: Normal in size without focal abnormality. Adrenals/Urinary Tract: Foley catheter in the urinary bladder. Otherwise, unremarkable bladder and adrenal glands. Simple appearing exophytic cyst arising from the left kidney, not needing imaging follow-up. 7 mm calculus in the upper left renal pelvis. Interval minimal dilatation of the left renal collecting system to the level of the ureteropelvic junction with no obstructing stone or mass seen. No ureteral dilatation. Stomach/Bowel: Interval diffuse low-density gastric wall thickening with mucosal enhancement, most pronounced involving the gastric antrum. Increased high density material in the 2nd portion of the duodenum on the initial postcontrast images, unchanged on the postcontrast images. No active contrast extravasation seen in the stomach or bowel. Status post partial colectomy including the proximal sigmoid colon. Enteric contrast in the excluded distal sigmoid colon and rectum, unchanged on the precontrast and postcontrast images. Multiple sigmoid colon diverticula in that region without evidence of diverticulitis.  Unremarkable small bowel. Surgically absent appendix. 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 Inferior to the ostomy, large ventral hernia containing herniated stomach, colon and small bowel is again demonstrated. No evidence of obstruction. Lymphatic: No enlarged lymph nodes. Reproductive: Status post hysterectomy. No adnexal masses. Other: Stable large ventral hernia containing herniated stomach, small bowel and colon without obstruction. An upper abdominal surgical drain is in place with no adjacent fluid. Musculoskeletal: Extensive lumbar and thoracic spine degenerative changes. IMPRESSION: 1. No active contrast extravasation seen in the stomach or bowel. 2. Interval diffuse low-density gastric wall thickening with mucosal enhancement, most pronounced involving the gastric antrum. This is consistent with gastritis. 3. Moderate-sized right pleural effusion and small to moderate-sized left pleural effusion with associated bilateral lower lobe compressive atelectasis. 4. Mild cardiomegaly. 5. 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 6. 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 7. Interval minimal dilatation of the left renal collecting system to the level of  the ureteropelvic junction with no obstructing stone or mass seen. This could be due to a recently passed stone or a UPJ obstruction. 8. Stable large ventral hernia containing herniated stomach, small bowel and colon without obstruction. 9. Status post partial colectomy including the proximal sigmoid colon. Enteric contrast in the excluded distal sigmoid colon and rectum, unchanged on the precontrast and postcontrast images. Multiple sigmoid colon diverticula in that region without evidence of diverticulitis. 10. 7 mm nonobstructing calculus in the upper left renal pelvis. 11. Aortic atherosclerosis. Aortic Atherosclerosis (ICD10-I70.0). Electronically Signed   By:  Elspeth Bathe M.D.   On: 06/24/2024 15:00    Anti-infectives: Anti-infectives (From admission, onward)    Start     Dose/Rate Route Frequency Ordered Stop   06/16/24 1800  vancomycin  (VANCOCIN ) IVPB 1000 mg/200 mL premix  Status:  Discontinued        1,000 mg 200 mL/hr over 60 Minutes Intravenous Every 24 hours 06/15/24 1317 06/15/24 1328   06/15/24 1700  vancomycin  (VANCOREADY) IVPB 2000 mg/400 mL  Status:  Discontinued        2,000 mg 200 mL/hr over 120 Minutes Intravenous  Once 06/15/24 1317 06/15/24 1328   06/15/24 1600  fluconazole  (DIFLUCAN ) IVPB 400 mg  Status:  Discontinued        400 mg 100 mL/hr over 120 Minutes Intravenous Every 24 hours 06/15/24 1317 06/16/24 0914   06/15/24 1500  metroNIDAZOLE  (FLAGYL ) IVPB 500 mg  Status:  Discontinued        500 mg 100 mL/hr over 60 Minutes Intravenous Every 12 hours 06/15/24 1216 06/15/24 1255   06/15/24 1400  ciprofloxacin  (CIPRO ) IVPB 400 mg  Status:  Discontinued        400 mg 200 mL/hr over 60 Minutes Intravenous Every 12 hours 06/15/24 1216 06/15/24 1255   06/15/24 1400  piperacillin -tazobactam (ZOSYN ) IVPB 3.375 g        3.375 g 12.5 mL/hr over 240 Minutes Intravenous Every 8 hours 06/15/24 1317 06/20/24 2359   06/15/24 1400  vancomycin  (VANCOCIN ) IVPB 1000 mg/200 mL premix  Status:  Discontinued        1,000 mg 200 mL/hr over 60 Minutes Intravenous Every 24 hours 06/15/24 1328 06/16/24 0914   06/15/24 1333  vancomycin  (VANCOCIN ) 1-5 GM/200ML-% IVPB       Note to Pharmacy: Romona Shaver E: cabinet override      06/15/24 1333 06/15/24 1410   06/15/24 1221  ciprofloxacin  (CIPRO ) 400 MG/200ML IVPB  Status:  Discontinued       Note to Pharmacy: Dannielle Railing S: cabinet override      06/15/24 1221 06/15/24 1343   06/15/24 1221  metroNIDAZOLE  (FLAGYL ) 500 MG/100ML IVPB  Status:  Discontinued       Note to Pharmacy: Dannielle Railing S: cabinet override      06/15/24 1221 06/15/24 1343   06/12/24 1223  clindamycin  (CLEOCIN ) 900  MG/50ML IVPB       Note to Pharmacy: Elaine Nest L: cabinet override      06/12/24 1223 06/12/24 1332   06/12/24 1131  ceFAZolin  (ANCEF ) 2-4 GM/100ML-% IVPB  Status:  Discontinued       Note to Pharmacy: Billy Sluder H: cabinet override      06/12/24 1131 06/12/24 1434   06/11/24 1300  clindamycin  (CLEOCIN ) IVPB 900 mg  Status:  Discontinued        900 mg 100 mL/hr over 30 Minutes Intravenous On call to O.R. 06/10/24 1041 06/10/24 1447   06/11/24 1300  ceFAZolin  (ANCEF ) IVPB 2g/100 mL premix        2 g 200 mL/hr over 30 Minutes Intravenous On call to O.R. 06/10/24 1447 06/12/24 1908       Assessment/Plan: POD 10 , s/p ex lap with graham patch repair and JP drain placement for Perforated duodenal ulcer, possible bleeding ulcer as well, Dr. Johanna at 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 Pylori negative -will actually leave her surgical drain for right now.  If she requires embo, we will be able to monitor this to assure no leak from necrosis.  Ok to start clears  -multi-modal pain control -PT, OOB as able pending femur fx recs -d/w primary service on ward   FEN - NPO VTE - on hold due to anemia ID - zosyn , completed   LOS: 19 days    Christina DELENA Shipper MD  06/26/2024

## 2024-06-27 DIAGNOSIS — N179 Acute kidney failure, unspecified: Secondary | ICD-10-CM | POA: Diagnosis not present

## 2024-06-27 DIAGNOSIS — J449 Chronic obstructive pulmonary disease, unspecified: Secondary | ICD-10-CM | POA: Diagnosis not present

## 2024-06-27 DIAGNOSIS — K276 Chronic or unspecified peptic ulcer, site unspecified, with both hemorrhage and perforation: Secondary | ICD-10-CM | POA: Diagnosis not present

## 2024-06-27 DIAGNOSIS — G4733 Obstructive sleep apnea (adult) (pediatric): Secondary | ICD-10-CM | POA: Diagnosis not present

## 2024-06-27 LAB — CBC
HCT: 24.8 % — ABNORMAL LOW (ref 36.0–46.0)
HCT: 25.3 % — ABNORMAL LOW (ref 36.0–46.0)
Hemoglobin: 8.3 g/dL — ABNORMAL LOW (ref 12.0–15.0)
Hemoglobin: 8.1 g/dL — ABNORMAL LOW (ref 12.0–15.0)
MCH: 32.3 pg (ref 26.0–34.0)
MCH: 31.3 pg (ref 26.0–34.0)
MCHC: 33.5 g/dL (ref 30.0–36.0)
MCHC: 32 g/dL (ref 30.0–36.0)
MCV: 96.5 fL (ref 80.0–100.0)
MCV: 97.7 fL (ref 80.0–100.0)
Platelets: 193 K/uL (ref 150–400)
Platelets: 231 K/uL (ref 150–400)
RBC: 2.57 MIL/uL — ABNORMAL LOW (ref 3.87–5.11)
RBC: 2.59 MIL/uL — ABNORMAL LOW (ref 3.87–5.11)
RDW: 18.4 % — ABNORMAL HIGH (ref 11.5–15.5)
RDW: 18.2 % — ABNORMAL HIGH (ref 11.5–15.5)
WBC: 10.5 K/uL (ref 4.0–10.5)
WBC: 11.3 K/uL — ABNORMAL HIGH (ref 4.0–10.5)
nRBC: 0.2 % (ref 0.0–0.2)
nRBC: 0.2 % (ref 0.0–0.2)

## 2024-06-27 LAB — BASIC METABOLIC PANEL WITH GFR
Anion gap: 8 (ref 5–15)
BUN: 36 mg/dL — ABNORMAL HIGH (ref 8–23)
CO2: 24 mmol/L (ref 22–32)
Calcium: 8.1 mg/dL — ABNORMAL LOW (ref 8.9–10.3)
Chloride: 102 mmol/L (ref 98–111)
Creatinine, Ser: 1.04 mg/dL — ABNORMAL HIGH (ref 0.44–1.00)
GFR, Estimated: 58 mL/min — ABNORMAL LOW (ref >60)
Glucose, Bld: 112 mg/dL — ABNORMAL HIGH (ref 70–99)
Potassium: 3.6 mmol/L (ref 3.5–5.1)
Sodium: 134 mmol/L — ABNORMAL LOW (ref 135–145)

## 2024-06-27 NOTE — Plan of Care (Signed)
  Problem: Education: Goal: Knowledge of General Education information will improve Description: Including pain rating scale, medication(s)/side effects and non-pharmacologic comfort measures Outcome: Not Progressing   Problem: Health Behavior/Discharge Planning: Goal: Ability to manage health-related needs will improve Outcome: Not Progressing   Problem: Clinical Measurements: Goal: Ability to maintain clinical measurements within normal limits will improve Outcome: Not Progressing Goal: Will remain free from infection Outcome: Not Progressing Goal: Diagnostic test results will improve Outcome: Not Progressing Goal: Respiratory complications will improve Outcome: Not Progressing Goal: Cardiovascular complication will be avoided Outcome: Not Progressing   Problem: Activity: Goal: Risk for activity intolerance will decrease Outcome: Not Progressing   Problem: Nutrition: Goal: Adequate nutrition will be maintained Outcome: Not Progressing   Problem: Coping: Goal: Level of anxiety will decrease Outcome: Not Progressing   Problem: Elimination: Goal: Will not experience complications related to bowel motility Outcome: Not Progressing Goal: Will not experience complications related to urinary retention Outcome: Not Progressing   Problem: Pain Managment: Goal: General experience of comfort will improve and/or be controlled Outcome: Not Progressing   Problem: Safety: Goal: Ability to remain free from injury will improve Outcome: Not Progressing   Problem: Skin Integrity: Goal: Risk for impaired skin integrity will decrease Outcome: Not Progressing   Problem: Safety: Goal: Non-violent Restraint(s) Outcome: Not Progressing   Problem: Activity: Goal: Ability to tolerate increased activity will improve Outcome: Not Progressing   Problem: Respiratory: Goal: Ability to maintain a clear airway and adequate ventilation will improve Outcome: Not Progressing   Problem:  Role Relationship: Goal: Method of communication will improve Outcome: Not Progressing   Problem: Education: Goal: Understanding of CV disease, CV risk reduction, and recovery process will improve Outcome: Not Progressing Goal: Individualized Educational Video(s) Outcome: Not Progressing   Problem: Activity: Goal: Ability to return to baseline activity level will improve Outcome: Not Progressing   Problem: Cardiovascular: Goal: Ability to achieve and maintain adequate cardiovascular perfusion will improve Outcome: Not Progressing Goal: Vascular access site(s) Level 0-1 will be maintained Outcome: Not Progressing   Problem: Health Behavior/Discharge Planning: Goal: Ability to safely manage health-related needs after discharge will improve Outcome: Not Progressing

## 2024-06-27 NOTE — Progress Notes (Signed)
Patient refuses CPAP for the night  

## 2024-06-27 NOTE — Plan of Care (Signed)
  Problem: Education: Goal: Knowledge of General Education information will improve Description: Including pain rating scale, medication(s)/side effects and non-pharmacologic comfort measures Outcome: Progressing   Problem: Health Behavior/Discharge Planning: Goal: Ability to manage health-related needs will improve Outcome: Progressing   Problem: Clinical Measurements: Goal: Ability to maintain clinical measurements within normal limits will improve Outcome: Progressing Goal: Will remain free from infection Outcome: Progressing Goal: Diagnostic test results will improve Outcome: Progressing Goal: Respiratory complications will improve Outcome: Progressing Goal: Cardiovascular complication will be avoided Outcome: Progressing   Problem: Activity: Goal: Risk for activity intolerance will decrease Outcome: Progressing   Problem: Nutrition: Goal: Adequate nutrition will be maintained Outcome: Progressing   Problem: Coping: Goal: Level of anxiety will decrease Outcome: Progressing   Problem: Elimination: Goal: Will not experience complications related to bowel motility Outcome: Progressing Goal: Will not experience complications related to urinary retention Outcome: Progressing   Problem: Pain Managment: Goal: General experience of comfort will improve and/or be controlled Outcome: Progressing   Problem: Safety: Goal: Ability to remain free from injury will improve Outcome: Progressing   Problem: Skin Integrity: Goal: Risk for impaired skin integrity will decrease Outcome: Progressing   Problem: Safety: Goal: Non-violent Restraint(s) Outcome: Progressing   Problem: Activity: Goal: Ability to tolerate increased activity will improve Outcome: Progressing   Problem: Respiratory: Goal: Ability to maintain a clear airway and adequate ventilation will improve Outcome: Progressing   Problem: Role Relationship: Goal: Method of communication will improve Outcome:  Progressing   Problem: Education: Goal: Understanding of CV disease, CV risk reduction, and recovery process will improve Outcome: Progressing Goal: Individualized Educational Video(s) Outcome: Progressing   Problem: Activity: Goal: Ability to return to baseline activity level will improve Outcome: Progressing   Problem: Cardiovascular: Goal: Ability to achieve and maintain adequate cardiovascular perfusion will improve Outcome: Progressing Goal: Vascular access site(s) Level 0-1 will be maintained Outcome: Progressing   Problem: Health Behavior/Discharge Planning: Goal: Ability to safely manage health-related needs after discharge will improve Outcome: Progressing

## 2024-06-27 NOTE — Progress Notes (Signed)
 PROGRESS NOTE        PATIENT DETAILS Name: Christina Castaneda Age: 68 y.o. Sex: female Date of Birth: Apr 25, 1956 Admit Date: 06/07/2024 Admitting Physician Orlin Fairly, MD ERE:Floopd, Mariano SQUIBB, DO  Brief Summary: Patient is a 68 y.o.  female with history of perforated sigmoid colon-s/p Hartman's/colostomy 2017-who initially presented to [facility] following a mechanical fall-she was found to have left distal femur fracture-underwent retrograde femoral nail on 6/20, unfortunately-further hospital course complicated by perforated duodenal ulcer with acute blood loss anemia resulting in hypovolemic shock-she was evaluated by general surgery and underwent emergent exploratory laparotomy with Graham's patch-postoperatively-she was transferred to [facility] ICU.  She was stabilized and then subsequently transferred to TRH service.  Unfortunately-further hospital course complicated by recurrent melena through ostomy and acute blood loss anemia-after discussion with [facility] and then with interventional radiology-patient underwent on GDA embolization on 7/4.  See below for further details.   Significant events: 6/15>> mechanical fall-left femur fracture/AKI-admit to TR[facility] at [facility]. 6/20>> retrograde femoral nail by orthopedics at [facility] 6/23>> perforated duodenal ulcer-hypotension-exploratory laparotomy/placement of Arlyss supplies.  Remained intubated postprocedure-transferred to [facility] ICU. 6/24>> extubated 6/25>> off pressors-out of ICU-drop in hemoglobin but nonbloody ostomy output. 6/26>> transferred to TRH service 7/02>> Black tarry stools in ostomy-Hb down to 6.1-General Surgery reconsulted-too early for EGD-CT angio negative for bleed-IR deferred embolization 7/03 >> continues to have black tarry stools-IR reconsulted for embolization.  Significant studies: 6/15>> CT left knee: Acute comminuted fracture of distal left femoral shaft. 6/16>> renal ultrasound: No hydronephrosis 6/23>> CT angiogram  abdomen: Perforated and bleeding peptic ulcer disease affecting proximal duodenum-appears to be hematoma in the gastric antrum/proximal duodenum. 6/26>> upper GI series with KUB: No evidence of contrast leak. 7/02>> CT angio GI bleed study: No active bleeding.  Significant microbiology data: 6/23>> blood culture: No growth 6/23>> peritoneal fluid: Abundant Candida albicans/moderate Candida glabrata.  Procedures: 6/20>> retrograde femoral nail by orthopedics at [facility] 6/23>> exploratory laparotomy with omental Graham's patch repair 7/03>> GDA embolization.  Consults: GI at [facility] General Surgery at [facility] and [facility] IR  Subjective: Black stools  in her ostomy-no other complaints.  Objective: Vitals: Blood pressure (!) 111/53, pulse 60, temperature 98.2 F (36.8 C), temperature source Oral, resp. rate 13, height 5' 5 (1.651 m), weight 112 kg, last menstrual period 11/18/2012, SpO2 98%.   Exam: Awake/alert Chest: Clear to auscultation Abdomen: Soft nontender nondistended Extremities: Trace edema Nonfocal exam but has generalized weakness.  Pertinent Labs/Radiology:    Latest Ref Rng & Units 06/27/2024    5:00 AM 06/27/2024    4:28 AM 06/26/2024   10:15 PM  CBC  WBC 4.0 - 10.5 K/uL 11.3  10.5  8.6   Hemoglobin 12.0 - 15.0 g/dL 8.1  8.3  7.3   Hematocrit 36.0 - 46.0 % 25.3  24.8  22.2   Platelets 150 - 400 K/uL 231  193  192     Lab Results  Component Value Date   NA 134 (L) 06/27/2024   K 3.6 06/27/2024   CL 102 06/27/2024   CO2 24 06/27/2024     Assessment/Plan: Hemorrhagic and septic shock secondary to perforated bleeding duodenal ulcer with acute blood loss anemia-s/p exploratory laparotomy with Arlyss patch repair on 6/23 at [facility] Recurrent upper GI bleeding with acute blood loss anemia noted on 7/2-CTA negative for extravasation-s/p GDA embolization by IR on  7/4. Had stabilized-unfortunately developed melanotic appearing stools on 7/2-although CTA negative-after discussion  with general surgery-not safe for endoscopy-underwent gastroduodenal artery embolization by IR on 7/3. Hb stable this morning-suspect melanotic appearing stools in ostomy bag is likely old blood making its way out.  Last transfusion on 7/3.   Plan is to follow CBC closely Remains on IV PPI.  Mechanical fall with left femur fracture-s/p retrograde left femoral nail on 6/20 Per orthopedics-WBAT LLE, knee immobilizer when ambulating, remove staples in 2 weeks.  Follow-up with Dr. Oneil Salvage at [place] 2 weeks from discharge.  AKI Likely hemodynamically mediated Resolved  Acute urinary retention Foley catheter remains in place-given plans for potential embolization-will keep in place for now-voiding trial when she is a bit more stable.  HTN BP soft-continue to hold all antihypertensives  HLD Continue to hold Crestor -resume when able-currently n.p.o.  COPD Stable-no wheezing Continue bronchodilators Stop further steroids at this point.  OSA CPAP nightly  Hypothyroidism Synthroid   History of perforated sigmoid colon-s/p Hartman/colostomy 2017  Debility/deconditioning PT/OT eval-SNF recommended.  Nutrition Status: Nutrition Problem: Increased nutrient needs Etiology: acute illness, wound healing Signs/Symptoms: estimated needs Interventions: Refer to RD note for recommendations   Pressure Ulcer: Agree with assessment and plan as outlined below. Pressure Injury 05/27/24 Sacrum Stage 2 -  Partial thickness loss of dermis presenting as a shallow open injury with a red, pink wound bed without slough. 4 small stage II present, each measures 0.4x 0.4 x 0.2 (Active)  05/27/24 1815  Location: Sacrum  Location Orientation:   Staging: Stage 2 -  Partial thickness loss of dermis presenting as a shallow open injury with a red, pink wound bed without slough.  Wound Description (Comments): 4 small stage II present, each measures 0.4x 0.4 x 0.2  DO NOT USE:  Present on  Admission:   Dressing Type Foam - Lift dressing to assess site every shift 06/24/24 9167    Morbid Obesity: Estimated body mass index is 41.09 kg/m as calculated from the following:   Height as of this encounter: 5' 5 (1.651 m).   Weight as of this encounter: 112 kg.   Code status:   Code Status: Full Code   DVT Prophylaxis: Place and maintain sequential compression device Start: 06/18/24 1325 SCDs Start: 06/15/24 1937 Place and maintain sequential compression device Start: 06/15/24 0155 SQ heparin  discontinued 7/2-due to high GI bleeding with ABLA.  Family Communication: Spouse [phone]-updated 7/3   Disposition Plan: Status is: Inpatient Remains inpatient appropriate because: Severity of illness   Planned Discharge Destination:Skilled nursing facility   Diet: Diet Order             DIET SOFT Fluid consistency: Thin  Diet effective now                     Antimicrobial agents: Anti-infectives (From admission, onward)    Start     Dose/Rate Route Frequency Ordered Stop   06/16/24 1800  vancomycin  (VANCOCIN ) IVPB 1000 mg/200 mL premix  Status:  Discontinued        1,000 mg 200 mL/hr over 60 Minutes Intravenous Every 24 hours 06/15/24 1317 06/15/24 1328   06/15/24 1700  vancomycin  (VANCOREADY) IVPB 2000 mg/400 mL  Status:  Discontinued        2,000 mg 200 mL/hr over 120 Minutes Intravenous  Once 06/15/24 1317 06/15/24 1328   06/15/24 1600  fluconazole  (DIFLUCAN ) IVPB 400 mg  Status:  Discontinued        400 mg 100  mL/hr over 120 Minutes Intravenous Every 24 hours 06/15/24 1317 06/16/24 0914   06/15/24 1500  metroNIDAZOLE  (FLAGYL ) IVPB 500 mg  Status:  Discontinued        500 mg 100 mL/hr over 60 Minutes Intravenous Every 12 hours 06/15/24 1216 06/15/24 1255   06/15/24 1400  ciprofloxacin  (CIPRO ) IVPB 400 mg  Status:  Discontinued        400 mg 200 mL/hr over 60 Minutes Intravenous Every 12 hours 06/15/24 1216 06/15/24 1255   06/15/24 1400   piperacillin -tazobactam (ZOSYN ) IVPB 3.375 g        3.375 g 12.5 mL/hr over 240 Minutes Intravenous Every 8 hours 06/15/24 1317 06/20/24 2359   06/15/24 1400  vancomycin  (VANCOCIN ) IVPB 1000 mg/200 mL premix  Status:  Discontinued        1,000 mg 200 mL/hr over 60 Minutes Intravenous Every 24 hours 06/15/24 1328 06/16/24 0914   06/15/24 1333  vancomycin  (VANCOCIN ) 1-5 GM/200ML-% IVPB       Note to Pharmacy: Romona Shaver E: cabinet override      06/15/24 1333 06/15/24 1410   06/15/24 1221  ciprofloxacin  (CIPRO ) 400 MG/200ML IVPB  Status:  Discontinued       Note to Pharmacy: Dannielle Railing S: cabinet override      06/15/24 1221 06/15/24 1343   06/15/24 1221  metroNIDAZOLE  (FLAGYL ) 500 MG/100ML IVPB  Status:  Discontinued       Note to Pharmacy: Dannielle Railing S: cabinet override      06/15/24 1221 06/15/24 1343   06/12/24 1223  clindamycin  (CLEOCIN ) 900 MG/50ML IVPB       Note to Pharmacy: Elaine Nest L: cabinet override      06/12/24 1223 06/12/24 1332   06/12/24 1131  ceFAZolin  (ANCEF ) 2-4 GM/100ML-% IVPB  Status:  Discontinued       Note to Pharmacy: Billy Sluder H: cabinet override      06/12/24 1131 06/12/24 1434   06/11/24 1300  clindamycin  (CLEOCIN ) IVPB 900 mg  Status:  Discontinued        900 mg 100 mL/hr over 30 Minutes Intravenous On call to O.R. 06/10/24 1041 06/10/24 1447   06/11/24 1300  ceFAZolin  (ANCEF ) IVPB 2g/100 mL premix        2 g 200 mL/hr over 30 Minutes Intravenous On call to O.R. 06/10/24 1447 06/12/24 1908        MEDICATIONS: Scheduled Meds:  arformoterol   15 mcg Nebulization Q12H   budesonide  (PULMICORT ) nebulizer solution  0.5 mg Nebulization BID   Chlorhexidine  Gluconate Cloth  6 each Topical Daily   feeding supplement  237 mL Oral BID BM   levothyroxine   150 mcg Oral Q0600   multivitamin with minerals  1 tablet Oral Daily   nutrition supplement (JUVEN)  1 packet Oral BID AC & HS   nystatin    Topical BID   pantoprazole  (PROTONIX ) IV   40 mg Intravenous Q12H   sodium chloride  flush  10-40 mL Intracatheter Q12H   sodium chloride  flush  3 mL Intravenous Q12H   sodium hypochlorite   Irrigation BID   Continuous Infusions: PRN Meds:.dextrose , iohexol , ipratropium-albuterol , magic mouthwash w/lidocaine , morphine  injection, naLOXone  (NARCAN )  injection, [DISCONTINUED] ondansetron  **OR** ondansetron  (ZOFRAN ) IV, mouth rinse, oxyCODONE , polyethylene glycol, prochlorperazine , sodium chloride  flush, sodium chloride  flush   I have personally reviewed following labs and imaging studies  LABORATORY DATA: CBC: Recent Labs  Lab 06/26/24 0445 06/26/24 1809 06/26/24 2215 06/27/24 0428 06/27/24 0500  WBC 8.5 8.3 8.6 10.5 11.3*  HGB 8.5* 7.2* 7.3* 8.3* 8.1*  HCT 26.0* 22.0* 22.2* 24.8* 25.3*  MCV 95.9 94.4 96.9 96.5 97.7  PLT 190 175 192 193 231    Basic Metabolic Panel: Recent Labs  Lab 06/21/24 0420 06/22/24 0459 06/23/24 0408 06/24/24 1858 06/25/24 0355 06/26/24 0445 06/27/24 0428  NA 137 134* 132* 133* 133* 136 134*  K 4.6 4.3 4.2 4.1 3.8 3.6 3.6  CL 102 102 101 100 100 103 102  CO2 26 27 26 25 26 24 24   GLUCOSE 88 84 83 97 90 76 112*  BUN 26* 26* 32* 49* 52* 40* 36*  CREATININE 1.03* 1.02* 0.95 1.10* 1.05* 1.18* 1.04*  CALCIUM  7.9* 8.0* 8.1* 7.8* 8.2* 7.8* 8.1*  MG 1.9 1.9  --   --   --   --   --   PHOS 3.1 3.1  --   --   --   --   --     GFR: Estimated Creatinine Clearance: 62.8 mL/min (A) (by C-G formula based on SCr of 1.04 mg/dL (H)).  Liver Function Tests: Recent Labs  Lab 06/22/24 0459  AST 35  ALT 22  ALKPHOS 42  BILITOT 0.6  PROT 4.5*  ALBUMIN <1.5*   No results for input(s): LIPASE, AMYLASE in the last 168 hours. No results for input(s): AMMONIA in the last 168 hours.  Coagulation Profile: No results for input(s): INR, PROTIME in the last 168 hours.  Cardiac Enzymes: No results for input(s): CKTOTAL, CKMB, CKMBINDEX, TROPONINI in the last 168 hours.  BNP (last 3  results) No results for input(s): PROBNP in the last 8760 hours.  Lipid Profile: No results for input(s): CHOL, HDL, LDLCALC, TRIG, CHOLHDL, LDLDIRECT in the last 72 hours.   Thyroid  Function Tests: No results for input(s): TSH, T4TOTAL, FREET4, T3FREE, THYROIDAB in the last 72 hours.  Anemia Panel: No results for input(s): VITAMINB12, FOLATE, FERRITIN, TIBC, IRON, RETICCTPCT in the last 72 hours.  Urine analysis:    Component Value Date/Time   COLORURINE YELLOW 06/07/2024 2026   APPEARANCEUR HAZY (A) 06/07/2024 2026   LABSPEC 1.015 06/07/2024 2026   PHURINE 5.0 06/07/2024 2026   GLUCOSEU NEGATIVE 06/07/2024 2026   HGBUR NEGATIVE 06/07/2024 2026   BILIRUBINUR NEGATIVE 06/07/2024 2026   KETONESUR NEGATIVE 06/07/2024 2026   PROTEINUR NEGATIVE 06/07/2024 2026   NITRITE NEGATIVE 06/07/2024 2026   LEUKOCYTESUR NEGATIVE 06/07/2024 2026    Sepsis Labs: Lactic Acid, Venous    Component Value Date/Time   LATICACIDVEN 1.0 05/27/2024 1945    MICROBIOLOGY: No results found for this or any previous visit (from the past 240 hours).   RADIOLOGY STUDIES/RESULTS: IR Transcath/Emboliz Result Date: 06/26/2024 INDICATION: Refractory upper GI bleed s/p endoscopy. EXAM: Title; COIL EMBOLIZATION OF GASTRODUODENAL ARTERY Listed procedures; 1.  ULTRASOUND GUIDANCE FOR ARTERIAL ACCESS 2. MESENTERIC ARTERIOGRAPHY, INCLUDING CELIAC, COMMON HEPATIC and GASTRODUODENAL ARTERIOGRAMS 3.  COIL EMBOLIZATION OF GASTRODUODENAL ARTERY COMPARISON:  CTA abdomen pelvis, 06/24/2024 MEDICATIONS: 4 mg Zofran  IV.  50 mg Benadryl  IV. ANESTHESIA/SEDATION: Moderate (conscious) sedation was employed during this procedure. A total of Versed  1.5 mg and Fentanyl  75 mcg was administered intravenously. Moderate Sedation Time: 89 minutes. The patient's level of consciousness and vital signs were monitored continuously by radiology nursing throughout the procedure under my direct supervision.  CONTRAST:  100 mL Omnipaque  300 FLUOROSCOPY: Radiation Exposure Index and estimated peak skin dose (PSD); Reference air kerma (RAK), 448.5 mGy. Kerma-area product (KAP), 7499.3 uGy*m. COMPLICATIONS: None immediate. PROCEDURE: Informed consent was obtained from the patient and/or  patient's representative following explanation of the procedure, risks, benefits and alternatives. All questions were addressed. A time out was performed prior to the initiation of the procedure. Maximal barrier sterile technique utilized including caps, mask, sterile gowns, sterile gloves, large sterile drape, hand hygiene, and chlorhexidine  prep. The RIGHT femoral head was marked fluoroscopically. Under sterile conditions and local anesthesia, the RIGHT common femoral artery access was performed with a micropuncture needle. Under direct ultrasound guidance, the RIGHT common femoral was accessed with a micropuncture kit. An ultrasound image was saved for documentation purposes. This allowed for placement of a 5 Fr 35 cm vascular sheath. A limited arteriogram was performed through the side arm of the sheath confirming appropriate access within the RIGHT common femoral artery. A limited abdominal aortogram was performed to help identify the celiac artery origin. Over a Bentson wire, a C2 catheter was advanced, back bled and flushed. The catheter was then utilized to select the celiac access, then advanced into the common hepatic then gastroduodenal arteries. Selective mesenteric arteriograms were performed at each level. Initial coil deployment was performed with the 5 Fr catheter within the gastroduodenal artery, then a 6 mm 0.035 inch Terumo Azure was attempted to be deployed, however due deployment mechanism fraying was noted on reposition attempt. The 5 Fr sheath was exchanged for an 8 Fr 35 cm vascular sheath, then a 6 Fr snare was deployed. The frayed deployment mechanism was ensnared, and the mechanism and coil were removed intact. The  C2 catheter was readvanced and utilized to select the celiac axis, and finally back into the GDA. Using a 2.4 Fr microcatheter and 0.016 inch Fathom microwire access into the gastroduodenal artery was performed and a selective arteriogram was performed. Selective embolization with multiple 0.018 inch micro coils was performed. The microcatheter was removed and arteriogram with the 5 Fr catheter at the common hepatic artery was performed. Adequate pruning of the GDA feeder arteries was achieved on post embolization arteriogram. Images were reviewed and the procedure was terminated. All wires, catheters and sheaths were removed from the patient. Hemostasis was achieved at the RIGHT groin access site with Angio-Seal closure device. The patient tolerated the procedure well without immediate post procedural complication. FINDINGS: *Celiac and common hepatic arteriograms with normal order and branching. *Gastroduodenal arteriogram without active extravasation noted. *Embolization with adequate pruning of the GDA distribution arteries. *Access via the RIGHT femoral artery. Angio-Seal closure at RIGHT groin. Palpable RLE pulses at the end of the Case IMPRESSION: 1. 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 2. Successful empiric coil embolization of the gastroduodenal artery for refractory upper GI bleed. PLAN: - The patient is to remain flat for 2 hours with RIGHT leg straight. - The patient may continue to experience residual melena however should resolve in the coming days. Thom Hall, MD [facility] Radiology Electronically Signed   By: Thom Hall M.D.   On: 06/26/2024 10:26   IR US  Guide Vasc Access Right Result Date: 06/26/2024 INDICATION: Refractory upper GI bleed s/p endoscopy. EXAM: Title; COIL EMBOLIZATION OF GASTRODUODENAL ARTERY Listed procedures; 1.  ULTRASOUND GUIDANCE FOR ARTERIAL ACCESS 2. MESENTERIC  ARTERIOGRAPHY, INCLUDING CELIAC, COMMON HEPATIC and GASTRODUODENAL ARTERIOGRAMS 3.  COIL EMBOLIZATION OF GASTRODUODENAL ARTERY COMPARISON:  CTA abdomen pelvis, 06/24/2024 MEDICATIONS: 4 mg Zofran  IV.  50 mg Benadryl  IV. ANESTHESIA/SEDATION: Moderate (conscious) sedation was employed during this procedure. A total of Versed  1.5 mg and Fentanyl  75 mcg was administered intravenously. Moderate Sedation Time: 89 minutes.  The patient's level of consciousness and vital signs were monitored continuously by radiology nursing throughout the procedure under my direct supervision. CONTRAST:  100 mL Omnipaque  300 FLUOROSCOPY: Radiation Exposure Index and estimated peak skin dose (PSD); Reference air kerma (RAK), 448.5 mGy. Kerma-area product (KAP), 7499.3 uGy*m. COMPLICATIONS: None immediate. PROCEDURE: Informed consent was obtained from the patient and/or patient's representative following explanation of the procedure, risks, benefits and alternatives. All questions were addressed. A time out was performed prior to the initiation of the procedure. Maximal barrier sterile technique utilized including caps, mask, sterile gowns, sterile gloves, large sterile drape, hand hygiene, and chlorhexidine  prep. The RIGHT femoral head was marked fluoroscopically. Under sterile conditions and local anesthesia, the RIGHT common femoral artery access was performed with a micropuncture needle. Under direct ultrasound guidance, the RIGHT common femoral was accessed with a micropuncture kit. An ultrasound image was saved for documentation purposes. This allowed for placement of a 5 Fr 35 cm vascular sheath. A limited arteriogram was performed through the side arm of the sheath confirming appropriate access within the RIGHT common femoral artery. A limited abdominal aortogram was performed to help identify the celiac artery origin. Over a Bentson wire, a C2 catheter was advanced, back bled and flushed. The catheter was then utilized to select the  celiac access, then advanced into the common hepatic then gastroduodenal arteries. Selective mesenteric arteriograms were performed at each level. Initial coil deployment was performed with the 5 Fr catheter within the gastroduodenal artery, then a 6 mm 0.035 inch Terumo Azure was attempted to be deployed, however due deployment mechanism fraying was noted on reposition attempt. The 5 Fr sheath was exchanged for an 8 Fr 35 cm vascular sheath, then a 6 Fr snare was deployed. The frayed deployment mechanism was ensnared, and the mechanism and coil were removed intact. The C2 catheter was readvanced and utilized to select the celiac axis, and finally back into the GDA. Using a 2.4 Fr microcatheter and 0.016 inch Fathom microwire access into the gastroduodenal artery was performed and a selective arteriogram was performed. Selective embolization with multiple 0.018 inch micro coils was performed. The microcatheter was removed and arteriogram with the 5 Fr catheter at the common hepatic artery was performed. Adequate pruning of the GDA feeder arteries was achieved on post embolization arteriogram. Images were reviewed and the procedure was terminated. All wires, catheters and sheaths were removed from the patient. Hemostasis was achieved at the RIGHT groin access site with Angio-Seal closure device. The patient tolerated the procedure well without immediate post procedural complication. FINDINGS: *Celiac and common hepatic arteriograms with normal order and branching. *Gastroduodenal arteriogram without active extravasation noted. *Embolization with adequate pruning of the GDA distribution arteries. *Access via the RIGHT femoral artery. Angio-Seal closure at RIGHT groin. Palpable RLE pulses at the end of the Case IMPRESSION: 1. 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 2. Successful empiric coil embolization of the gastroduodenal artery for refractory upper GI bleed.  PLAN: - The patient is to remain flat for 2 hours with RIGHT leg straight. - The patient may continue to experience residual melena however should resolve in the coming days. Thom Hall, MD [facility] Radiology Electronically Signed   By: Thom Hall M.D.   On: 06/26/2024 10:26   IR Angiogram Visceral Selective Result Date: 06/26/2024 INDICATION: Refractory upper GI bleed s/p endoscopy. EXAM: Title; COIL EMBOLIZATION OF GASTRODUODENAL ARTERY Listed procedures; 1.  ULTRASOUND GUIDANCE FOR ARTERIAL ACCESS 2. MESENTERIC  ARTERIOGRAPHY, INCLUDING CELIAC, COMMON HEPATIC and GASTRODUODENAL ARTERIOGRAMS 3.  COIL EMBOLIZATION OF GASTRODUODENAL ARTERY COMPARISON:  CTA abdomen pelvis, 06/24/2024 MEDICATIONS: 4 mg Zofran  IV.  50 mg Benadryl  IV. ANESTHESIA/SEDATION: Moderate (conscious) sedation was employed during this procedure. A total of Versed  1.5 mg and Fentanyl  75 mcg was administered intravenously. Moderate Sedation Time: 89 minutes. The patient's level of consciousness and vital signs were monitored continuously by radiology nursing throughout the procedure under my direct supervision. CONTRAST:  100 mL Omnipaque  300 FLUOROSCOPY: Radiation Exposure Index and estimated peak skin dose (PSD); Reference air kerma (RAK), 448.5 mGy. Kerma-area product (KAP), 7499.3 uGy*m. COMPLICATIONS: None immediate. PROCEDURE: Informed consent was obtained from the patient and/or patient's representative following explanation of the procedure, risks, benefits and alternatives. All questions were addressed. A time out was performed prior to the initiation of the procedure. Maximal barrier sterile technique utilized including caps, mask, sterile gowns, sterile gloves, large sterile drape, hand hygiene, and chlorhexidine  prep. The RIGHT femoral head was marked fluoroscopically. Under sterile conditions and local anesthesia, the RIGHT common femoral artery access was performed with a  micropuncture needle. Under direct ultrasound guidance, the RIGHT common femoral was accessed with a micropuncture kit. An ultrasound image was saved for documentation purposes. This allowed for placement of a 5 Fr 35 cm vascular sheath. A limited arteriogram was performed through the side arm of the sheath confirming appropriate access within the RIGHT common femoral artery. A limited abdominal aortogram was performed to help identify the celiac artery origin. Over a Bentson wire, a C2 catheter was advanced, back bled and flushed. The catheter was then utilized to select the celiac access, then advanced into the common hepatic then gastroduodenal arteries. Selective mesenteric arteriograms were performed at each level. Initial coil deployment was performed with the 5 Fr catheter within the gastroduodenal artery, then a 6 mm 0.035 inch Terumo Azure was attempted to be deployed, however due deployment mechanism fraying was noted on reposition attempt. The 5 Fr sheath was exchanged for an 8 Fr 35 cm vascular sheath, then a 6 Fr snare was deployed. The frayed deployment mechanism was ensnared, and the mechanism and coil were removed intact. The C2 catheter was readvanced and utilized to select the celiac axis, and finally back into the GDA. Using a 2.4 Fr microcatheter and 0.016 inch Fathom microwire access into the gastroduodenal artery was performed and a selective arteriogram was performed. Selective embolization with multiple 0.018 inch micro coils was performed. The microcatheter was removed and arteriogram with the 5 Fr catheter at the common hepatic artery was performed. Adequate pruning of the GDA feeder arteries was achieved on post embolization arteriogram. Images were reviewed and the procedure was terminated. All wires, catheters and sheaths were removed from the patient. Hemostasis was achieved at the RIGHT groin access site with Angio-Seal closure device. The patient tolerated the procedure well without  immediate post procedural complication. FINDINGS: *Celiac and common hepatic arteriograms with normal order and branching. *Gastroduodenal arteriogram without active extravasation noted. *Embolization with adequate pruning of the GDA distribution arteries. *Access via the RIGHT femoral artery. Angio-Seal closure at RIGHT groin. Palpable RLE pulses at the end of the Case IMPRESSION: 1. 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 2. Successful empiric coil embolization of the gastroduodenal artery for refractory upper GI bleed. PLAN: - The patient is to remain flat for 2 hours with RIGHT leg straight. - The patient may continue to experience residual melena however should resolve in the coming days.  Thom Hall, MD [facility] Radiology Electronically Signed   By: Thom Hall M.D.   On: 06/26/2024 10:26   IR Angiogram Visceral Selective Result Date: 06/26/2024 INDICATION: Refractory upper GI bleed s/p endoscopy. EXAM: Title; COIL EMBOLIZATION OF GASTRODUODENAL ARTERY Listed procedures; 1.  ULTRASOUND GUIDANCE FOR ARTERIAL ACCESS 2. MESENTERIC ARTERIOGRAPHY, INCLUDING CELIAC, COMMON HEPATIC and GASTRODUODENAL ARTERIOGRAMS 3.  COIL EMBOLIZATION OF GASTRODUODENAL ARTERY COMPARISON:  CTA abdomen pelvis, 06/24/2024 MEDICATIONS: 4 mg Zofran  IV.  50 mg Benadryl  IV. ANESTHESIA/SEDATION: Moderate (conscious) sedation was employed during this procedure. A total of Versed  1.5 mg and Fentanyl  75 mcg was administered intravenously. Moderate Sedation Time: 89 minutes. The patient's level of consciousness and vital signs were monitored continuously by radiology nursing throughout the procedure under my direct supervision. CONTRAST:  100 mL Omnipaque  300 FLUOROSCOPY: Radiation Exposure Index and estimated peak skin dose (PSD); Reference air kerma (RAK), 448.5 mGy. Kerma-area product (KAP), 7499.3 uGy*m. COMPLICATIONS: None  immediate. PROCEDURE: Informed consent was obtained from the patient and/or patient's representative following explanation of the procedure, risks, benefits and alternatives. All questions were addressed. A time out was performed prior to the initiation of the procedure. Maximal barrier sterile technique utilized including caps, mask, sterile gowns, sterile gloves, large sterile drape, hand hygiene, and chlorhexidine  prep. The RIGHT femoral head was marked fluoroscopically. Under sterile conditions and local anesthesia, the RIGHT common femoral artery access was performed with a micropuncture needle. Under direct ultrasound guidance, the RIGHT common femoral was accessed with a micropuncture kit. An ultrasound image was saved for documentation purposes. This allowed for placement of a 5 Fr 35 cm vascular sheath. A limited arteriogram was performed through the side arm of the sheath confirming appropriate access within the RIGHT common femoral artery. A limited abdominal aortogram was performed to help identify the celiac artery origin. Over a Bentson wire, a C2 catheter was advanced, back bled and flushed. The catheter was then utilized to select the celiac access, then advanced into the common hepatic then gastroduodenal arteries. Selective mesenteric arteriograms were performed at each level. Initial coil deployment was performed with the 5 Fr catheter within the gastroduodenal artery, then a 6 mm 0.035 inch Terumo Azure was attempted to be deployed, however due deployment mechanism fraying was noted on reposition attempt. The 5 Fr sheath was exchanged for an 8 Fr 35 cm vascular sheath, then a 6 Fr snare was deployed. The frayed deployment mechanism was ensnared, and the mechanism and coil were removed intact. The C2 catheter was readvanced and utilized to select the celiac axis, and finally back into the GDA. Using a 2.4 Fr microcatheter and 0.016 inch Fathom microwire access into the gastroduodenal artery was  performed and a selective arteriogram was performed. Selective embolization with multiple 0.018 inch micro coils was performed. The microcatheter was removed and arteriogram with the 5 Fr catheter at the common hepatic artery was performed. Adequate pruning of the GDA feeder arteries was achieved on post embolization arteriogram. Images were reviewed and the procedure was terminated. All wires, catheters and sheaths were removed from the patient. Hemostasis was achieved at the RIGHT groin access site with Angio-Seal closure device. The patient tolerated the procedure well without immediate post procedural complication. FINDINGS: *Celiac and common hepatic arteriograms with normal order and branching. *Gastroduodenal arteriogram without active extravasation noted. *Embolization with adequate pruning of the GDA distribution arteries. *Access via the RIGHT femoral artery. Angio-Seal closure at RIGHT groin. Palpable RLE pulses at the end of the Case  IMPRESSION: 1. 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 2. Successful empiric coil embolization of the gastroduodenal artery for refractory upper GI bleed. PLAN: - The patient is to remain flat for 2 hours with RIGHT leg straight. - The patient may continue to experience residual melena however should resolve in the coming days. Thom Hall, MD [facility] Radiology Electronically Signed   By: Thom Hall M.D.   On: 06/26/2024 10:26   IR Angiogram Visceral Selective Result Date: 06/26/2024 INDICATION: Refractory upper GI bleed s/p endoscopy. EXAM: Title; COIL EMBOLIZATION OF GASTRODUODENAL ARTERY Listed procedures; 1.  ULTRASOUND GUIDANCE FOR ARTERIAL ACCESS 2. MESENTERIC ARTERIOGRAPHY, INCLUDING CELIAC, COMMON HEPATIC and GASTRODUODENAL ARTERIOGRAMS 3.  COIL EMBOLIZATION OF GASTRODUODENAL ARTERY COMPARISON:  CTA abdomen pelvis, 06/24/2024 MEDICATIONS: 4 mg Zofran  IV.   50 mg Benadryl  IV. ANESTHESIA/SEDATION: Moderate (conscious) sedation was employed during this procedure. A total of Versed  1.5 mg and Fentanyl  75 mcg was administered intravenously. Moderate Sedation Time: 89 minutes. The patient's level of consciousness and vital signs were monitored continuously by radiology nursing throughout the procedure under my direct supervision. CONTRAST:  100 mL Omnipaque  300 FLUOROSCOPY: Radiation Exposure Index and estimated peak skin dose (PSD); Reference air kerma (RAK), 448.5 mGy. Kerma-area product (KAP), 7499.3 uGy*m. COMPLICATIONS: None immediate. PROCEDURE: Informed consent was obtained from the patient and/or patient's representative following explanation of the procedure, risks, benefits and alternatives. All questions were addressed. A time out was performed prior to the initiation of the procedure. Maximal barrier sterile technique utilized including caps, mask, sterile gowns, sterile gloves, large sterile drape, hand hygiene, and chlorhexidine  prep. The RIGHT femoral head was marked fluoroscopically. Under sterile conditions and local anesthesia, the RIGHT common femoral artery access was performed with a micropuncture needle. Under direct ultrasound guidance, the RIGHT common femoral was accessed with a micropuncture kit. An ultrasound image was saved for documentation purposes. This allowed for placement of a 5 Fr 35 cm vascular sheath. A limited arteriogram was performed through the side arm of the sheath confirming appropriate access within the RIGHT common femoral artery. A limited abdominal aortogram was performed to help identify the celiac artery origin. Over a Bentson wire, a C2 catheter was advanced, back bled and flushed. The catheter was then utilized to select the celiac access, then advanced into the common hepatic then gastroduodenal arteries. Selective mesenteric arteriograms were performed at each level. Initial coil deployment was performed with the 5 Fr  catheter within the gastroduodenal artery, then a 6 mm 0.035 inch Terumo Azure was attempted to be deployed, however due deployment mechanism fraying was noted on reposition attempt. The 5 Fr sheath was exchanged for an 8 Fr 35 cm vascular sheath, then a 6 Fr snare was deployed. The frayed deployment mechanism was ensnared, and the mechanism and coil were removed intact. The C2 catheter was readvanced and utilized to select the celiac axis, and finally back into the GDA. Using a 2.4 Fr microcatheter and 0.016 inch Fathom microwire access into the gastroduodenal artery was performed and a selective arteriogram was performed. Selective embolization with multiple 0.018 inch micro coils was performed. The microcatheter was removed and arteriogram with the 5 Fr catheter at the common hepatic artery was performed. Adequate pruning of the GDA feeder arteries was achieved on post embolization arteriogram. Images were reviewed and the procedure was terminated. All wires, catheters and sheaths were removed from the patient. Hemostasis was achieved at the RIGHT groin access site with Angio-Seal closure  device. The patient tolerated the procedure well without immediate post procedural complication. FINDINGS: *Celiac and common hepatic arteriograms with normal order and branching. *Gastroduodenal arteriogram without active extravasation noted. *Embolization with adequate pruning of the GDA distribution arteries. *Access via the RIGHT femoral artery. Angio-Seal closure at RIGHT groin. Palpable RLE pulses at the end of the Case IMPRESSION: 1. 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 2. Successful empiric coil embolization of the gastroduodenal artery for refractory upper GI bleed. PLAN: - The patient is to remain flat for 2 hours with RIGHT leg straight. - The patient may continue to experience residual melena however should resolve in the coming days. Thom Hall, MD [facility] Radiology Electronically Signed   By: Thom Hall M.D.   On: 06/26/2024 10:26     LOS: 20 days   Donalda Applebaum, MD  [facility]    To contact the attending provider between 7A-7P or the covering provider during after hours 7P-7A, please log into the web site [email or web address] and access using universal [place] password for that web site. If you do not have the password, please call the hospital operator.  06/27/2024, 10:41 AM

## 2024-06-27 NOTE — Progress Notes (Signed)
 Referring Physician(s): Dr. Donalda Applebaum  Supervising Physician: Jenna Hacker  Patient Status:  [facility] - In-pt  Chief Complaint: GI bleed  Subjective: Patient lying comfortably in bed.  Family at bedside.  Denies further episodes of BRBPR and signifcantly improved abdominal discomfort.  Hgb Stable.   Allergies: Penicillins, Biphosphate, and Fluticasone   Medications: Prior to Admission medications   Medication Sig Start Date End Date Taking? Authorizing Provider  acetaminophen  (TYLENOL ) 500 MG tablet Take 1,000 mg by mouth every 6 (six) hours.   Yes [provider]  albuterol  (VENTOLIN  HFA) 108 (90 Base) MCG/ACT inhaler Inhale 2 puffs into the lungs every 6 (six) hours as needed. Wheezing and shortness of breath 05/31/24 07/30/24 Yes Shahmehdi, Seyed A, MD  allopurinol  (ZYLOPRIM ) 300 MG tablet Take 300 mg by mouth daily.   Yes [provider]  Cyanocobalamin (VITAMIN B-12 CR PO) Take 1 tablet by mouth daily.   Yes [provider]  Ergocalciferol  50 MCG (2000 UT) TABS Take 2,000 Units by mouth daily.   Yes [provider]  fluticasone -salmeterol (ADVAIR) 500-50 MCG/ACT AEPB Inhale 1 puff into the lungs in the morning and at bedtime. 05/31/24 06/30/24 Yes Shahmehdi, Seyed A, MD  gabapentin  (NEURONTIN ) 300 MG capsule Take 1-2 capsules by mouth at bedtime. 02/27/21  Yes [provider]  levothyroxine  (SYNTHROID ) 137 MCG tablet Take 137 mcg by mouth daily.   Yes [provider]  nystatin  (MYCOSTATIN /NYSTOP ) powder Apply topically 3 (three) times daily. 05/31/24  Yes Shahmehdi, Seyed A, MD  rosuvastatin  (CRESTOR ) 10 MG tablet Take 10 mg by mouth daily.   Yes [provider]  senna-docusate (SENOKOT-S) 8.6-50 MG tablet Take 2 tablets by mouth at bedtime. 05/31/24 06/30/24 Yes Shahmehdi, Adriana LABOR, MD     Vital Signs: BP (!) 110/45 (BP Location: Left Wrist)   Pulse 60   Temp 98.2 F (36.8 C) (Oral)   Resp 16   Ht 5' 5 (1.651 m)    Wt 246 lb 14.6 oz (112 kg)   LMP 11/18/2012   SpO2 97%   BMI 41.09 kg/m   Physical Exam NAD, alert Abdomen: soft, non-distended.  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 No BRB.  SKin: procedure site intact, clean, and dry. Dressings have already been removed.   Imaging: IR Transcath/Emboliz Result Date: 06/26/2024 INDICATION: Refractory upper GI bleed s/p endoscopy. EXAM: Title; COIL EMBOLIZATION OF GASTRODUODENAL ARTERY Listed procedures; 1.  ULTRASOUND GUIDANCE FOR ARTERIAL ACCESS 2. MESENTERIC ARTERIOGRAPHY, INCLUDING CELIAC, COMMON HEPATIC and GASTRODUODENAL ARTERIOGRAMS 3.  COIL EMBOLIZATION OF GASTRODUODENAL ARTERY COMPARISON:  CTA abdomen pelvis, 06/24/2024 MEDICATIONS: 4 mg Zofran  IV.  50 mg Benadryl  IV. ANESTHESIA/SEDATION: Moderate (conscious) sedation was employed during this procedure. A total of Versed  1.5 mg and Fentanyl  75 mcg was administered intravenously. Moderate Sedation Time: 89 minutes. The patient's level of consciousness and vital signs were monitored continuously by radiology nursing throughout the procedure under my direct supervision. CONTRAST:  100 mL Omnipaque  300 FLUOROSCOPY: Radiation Exposure Index and estimated peak skin dose (PSD); Reference air kerma (RAK), 448.5 mGy. Kerma-area product (KAP), 7499.3 uGy*m. COMPLICATIONS: None immediate. PROCEDURE: Informed consent was obtained from the patient and/or patient's representative following explanation of the procedure, risks, benefits and alternatives. All questions were addressed. A time out was performed prior to the initiation of the procedure. Maximal barrier sterile technique utilized including caps, mask, sterile gowns, sterile gloves, large sterile drape, hand hygiene, and chlorhexidine  prep. The RIGHT femoral head was marked fluoroscopically. Under sterile conditions and local anesthesia,  the RIGHT common femoral artery access was performed with a micropuncture needle. Under direct ultrasound guidance, the RIGHT  common femoral was accessed with a micropuncture kit. An ultrasound image was saved for documentation purposes. This allowed for placement of a 5 Fr 35 cm vascular sheath. A limited arteriogram was performed through the side arm of the sheath confirming appropriate access within the RIGHT common femoral artery. A limited abdominal aortogram was performed to help identify the celiac artery origin. Over a Bentson wire, a C2 catheter was advanced, back bled and flushed. The catheter was then utilized to select the celiac access, then advanced into the common hepatic then gastroduodenal arteries. Selective mesenteric arteriograms were performed at each level. Initial coil deployment was performed with the 5 Fr catheter within the gastroduodenal artery, then a 6 mm 0.035 inch Terumo Azure was attempted to be deployed, however due deployment mechanism fraying was noted on reposition attempt. The 5 Fr sheath was exchanged for an 8 Fr 35 cm vascular sheath, then a 6 Fr snare was deployed. The frayed deployment mechanism was ensnared, and the mechanism and coil were removed intact. The C2 catheter was readvanced and utilized to select the celiac axis, and finally back into the GDA. Using a 2.4 Fr microcatheter and 0.016 inch Fathom microwire access into the gastroduodenal artery was performed and a selective arteriogram was performed. Selective embolization with multiple 0.018 inch micro coils was performed. The microcatheter was removed and arteriogram with the 5 Fr catheter at the common hepatic artery was performed. Adequate pruning of the GDA feeder arteries was achieved on post embolization arteriogram. Images were reviewed and the procedure was terminated. All wires, catheters and sheaths were removed from the patient. Hemostasis was achieved at the RIGHT groin access site with Angio-Seal closure device. The patient tolerated the procedure well without immediate post procedural complication. FINDINGS: *Celiac and  common hepatic arteriograms with normal order and branching. *Gastroduodenal arteriogram without active extravasation noted. *Embolization with adequate pruning of the GDA distribution arteries. *Access via the RIGHT femoral artery. Angio-Seal closure at RIGHT groin. Palpable RLE pulses at the end of the Case IMPRESSION: 1. 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 2. Successful empiric coil embolization of the gastroduodenal artery for refractory upper GI bleed. PLAN: - The patient is to remain flat for 2 hours with RIGHT leg straight. - The patient may continue to experience residual melena however should resolve in the coming days. Thom Hall, MD [facility] Radiology Electronically Signed   By: Thom Hall M.D.   On: 06/26/2024 10:26   IR US  Guide Vasc Access Right Result Date: 06/26/2024 INDICATION: Refractory upper GI bleed s/p endoscopy. EXAM: Title; COIL EMBOLIZATION OF GASTRODUODENAL ARTERY Listed procedures; 1.  ULTRASOUND GUIDANCE FOR ARTERIAL ACCESS 2. MESENTERIC ARTERIOGRAPHY, INCLUDING CELIAC, COMMON HEPATIC and GASTRODUODENAL ARTERIOGRAMS 3.  COIL EMBOLIZATION OF GASTRODUODENAL ARTERY COMPARISON:  CTA abdomen pelvis, 06/24/2024 MEDICATIONS: 4 mg Zofran  IV.  50 mg Benadryl  IV. ANESTHESIA/SEDATION: Moderate (conscious) sedation was employed during this procedure. A total of Versed  1.5 mg and Fentanyl  75 mcg was administered intravenously. Moderate Sedation Time: 89 minutes. The patient's level of consciousness and vital signs were monitored continuously by radiology nursing throughout the procedure under my direct supervision. CONTRAST:  100 mL Omnipaque  300 FLUOROSCOPY: Radiation Exposure Index and estimated peak skin dose (PSD); Reference air kerma (RAK), 448.5 mGy. Kerma-area product (KAP), 7499.3 uGy*m. COMPLICATIONS: None immediate. PROCEDURE: Informed consent was obtained from the patient  and/or patient's representative following explanation of the procedure, risks, benefits and alternatives. All questions were addressed. A time out was performed prior to the initiation of the procedure. Maximal barrier sterile technique utilized including caps, mask, sterile gowns, sterile gloves, large sterile drape, hand hygiene, and chlorhexidine  prep. The RIGHT femoral head was marked fluoroscopically. Under sterile conditions and local anesthesia, the RIGHT common femoral artery access was performed with a micropuncture needle. Under direct ultrasound guidance, the RIGHT common femoral was accessed with a micropuncture kit. An ultrasound image was saved for documentation purposes. This allowed for placement of a 5 Fr 35 cm vascular sheath. A limited arteriogram was performed through the side arm of the sheath confirming appropriate access within the RIGHT common femoral artery. A limited abdominal aortogram was performed to help identify the celiac artery origin. Over a Bentson wire, a C2 catheter was advanced, back bled and flushed. The catheter was then utilized to select the celiac access, then advanced into the common hepatic then gastroduodenal arteries. Selective mesenteric arteriograms were performed at each level. Initial coil deployment was performed with the 5 Fr catheter within the gastroduodenal artery, then a 6 mm 0.035 inch Terumo Azure was attempted to be deployed, however due deployment mechanism fraying was noted on reposition attempt. The 5 Fr sheath was exchanged for an 8 Fr 35 cm vascular sheath, then a 6 Fr snare was deployed. The frayed deployment mechanism was ensnared, and the mechanism and coil were removed intact. The C2 catheter was readvanced and utilized to select the celiac axis, and finally back into the GDA. Using a 2.4 Fr microcatheter and 0.016 inch Fathom microwire access into the gastroduodenal artery was performed and a selective arteriogram was performed. Selective  embolization with multiple 0.018 inch micro coils was performed. The microcatheter was removed and arteriogram with the 5 Fr catheter at the common hepatic artery was performed. Adequate pruning of the GDA feeder arteries was achieved on post embolization arteriogram. Images were reviewed and the procedure was terminated. All wires, catheters and sheaths were removed from the patient. Hemostasis was achieved at the RIGHT groin access site with Angio-Seal closure device. The patient tolerated the procedure well without immediate post procedural complication. FINDINGS: *Celiac and common hepatic arteriograms with normal order and branching. *Gastroduodenal arteriogram without active extravasation noted. *Embolization with adequate pruning of the GDA distribution arteries. *Access via the RIGHT femoral artery. Angio-Seal closure at RIGHT groin. Palpable RLE pulses at the end of the Case IMPRESSION: 1. 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 2. Successful empiric coil embolization of the gastroduodenal artery for refractory upper GI bleed. PLAN: - The patient is to remain flat for 2 hours with RIGHT leg straight. - The patient may continue to experience residual melena however should resolve in the coming days. Thom Hall, MD [facility] Radiology Electronically Signed   By: Thom Hall M.D.   On: 06/26/2024 10:26   IR Angiogram Visceral Selective Result Date: 06/26/2024 INDICATION: Refractory upper GI bleed s/p endoscopy. EXAM: Title; COIL EMBOLIZATION OF GASTRODUODENAL ARTERY Listed procedures; 1.  ULTRASOUND GUIDANCE FOR ARTERIAL ACCESS 2. MESENTERIC ARTERIOGRAPHY, INCLUDING CELIAC, COMMON HEPATIC and GASTRODUODENAL ARTERIOGRAMS 3.  COIL EMBOLIZATION OF GASTRODUODENAL ARTERY COMPARISON:  CTA abdomen pelvis, 06/24/2024 MEDICATIONS: 4 mg Zofran  IV.  50 mg Benadryl  IV. ANESTHESIA/SEDATION: Moderate (conscious)  sedation was employed during this procedure. A total of Versed  1.5 mg and Fentanyl  75 mcg was administered intravenously. Moderate Sedation Time: 89 minutes. The  patient's level of consciousness and vital signs were monitored continuously by radiology nursing throughout the procedure under my direct supervision. CONTRAST:  100 mL Omnipaque  300 FLUOROSCOPY: Radiation Exposure Index and estimated peak skin dose (PSD); Reference air kerma (RAK), 448.5 mGy. Kerma-area product (KAP), 7499.3 uGy*m. COMPLICATIONS: None immediate. PROCEDURE: Informed consent was obtained from the patient and/or patient's representative following explanation of the procedure, risks, benefits and alternatives. All questions were addressed. A time out was performed prior to the initiation of the procedure. Maximal barrier sterile technique utilized including caps, mask, sterile gowns, sterile gloves, large sterile drape, hand hygiene, and chlorhexidine  prep. The RIGHT femoral head was marked fluoroscopically. Under sterile conditions and local anesthesia, the RIGHT common femoral artery access was performed with a micropuncture needle. Under direct ultrasound guidance, the RIGHT common femoral was accessed with a micropuncture kit. An ultrasound image was saved for documentation purposes. This allowed for placement of a 5 Fr 35 cm vascular sheath. A limited arteriogram was performed through the side arm of the sheath confirming appropriate access within the RIGHT common femoral artery. A limited abdominal aortogram was performed to help identify the celiac artery origin. Over a Bentson wire, a C2 catheter was advanced, back bled and flushed. The catheter was then utilized to select the celiac access, then advanced into the common hepatic then gastroduodenal arteries. Selective mesenteric arteriograms were performed at each level. Initial coil deployment was performed with the 5 Fr catheter within the gastroduodenal artery, then a 6 mm 0.035  inch Terumo Azure was attempted to be deployed, however due deployment mechanism fraying was noted on reposition attempt. The 5 Fr sheath was exchanged for an 8 Fr 35 cm vascular sheath, then a 6 Fr snare was deployed. The frayed deployment mechanism was ensnared, and the mechanism and coil were removed intact. The C2 catheter was readvanced and utilized to select the celiac axis, and finally back into the GDA. Using a 2.4 Fr microcatheter and 0.016 inch Fathom microwire access into the gastroduodenal artery was performed and a selective arteriogram was performed. Selective embolization with multiple 0.018 inch micro coils was performed. The microcatheter was removed and arteriogram with the 5 Fr catheter at the common hepatic artery was performed. Adequate pruning of the GDA feeder arteries was achieved on post embolization arteriogram. Images were reviewed and the procedure was terminated. All wires, catheters and sheaths were removed from the patient. Hemostasis was achieved at the RIGHT groin access site with Angio-Seal closure device. The patient tolerated the procedure well without immediate post procedural complication. FINDINGS: *Celiac and common hepatic arteriograms with normal order and branching. *Gastroduodenal arteriogram without active extravasation noted. *Embolization with adequate pruning of the GDA distribution arteries. *Access via the RIGHT femoral artery. Angio-Seal closure at RIGHT groin. Palpable RLE pulses at the end of the Case IMPRESSION: 1. 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 2. Successful empiric coil embolization of the gastroduodenal artery for refractory upper GI bleed. PLAN: - The patient is to remain flat for 2 hours with RIGHT leg straight. - The patient may continue to experience residual melena however should resolve in the coming days. Thom Hall, MD [facility]  Radiology Electronically Signed   By: Thom Hall M.D.   On: 06/26/2024 10:26   IR Angiogram Visceral Selective Result Date: 06/26/2024 INDICATION: Refractory upper GI bleed s/p endoscopy. EXAM: Title; COIL EMBOLIZATION OF GASTRODUODENAL ARTERY Listed procedures; 1.  ULTRASOUND GUIDANCE FOR ARTERIAL ACCESS 2. MESENTERIC ARTERIOGRAPHY,  INCLUDING CELIAC, COMMON HEPATIC and GASTRODUODENAL ARTERIOGRAMS 3.  COIL EMBOLIZATION OF GASTRODUODENAL ARTERY COMPARISON:  CTA abdomen pelvis, 06/24/2024 MEDICATIONS: 4 mg Zofran  IV.  50 mg Benadryl  IV. ANESTHESIA/SEDATION: Moderate (conscious) sedation was employed during this procedure. A total of Versed  1.5 mg and Fentanyl  75 mcg was administered intravenously. Moderate Sedation Time: 89 minutes. The patient's level of consciousness and vital signs were monitored continuously by radiology nursing throughout the procedure under my direct supervision. CONTRAST:  100 mL Omnipaque  300 FLUOROSCOPY: Radiation Exposure Index and estimated peak skin dose (PSD); Reference air kerma (RAK), 448.5 mGy. Kerma-area product (KAP), 7499.3 uGy*m. COMPLICATIONS: None immediate. PROCEDURE: Informed consent was obtained from the patient and/or patient's representative following explanation of the procedure, risks, benefits and alternatives. All questions were addressed. A time out was performed prior to the initiation of the procedure. Maximal barrier sterile technique utilized including caps, mask, sterile gowns, sterile gloves, large sterile drape, hand hygiene, and chlorhexidine  prep. The RIGHT femoral head was marked fluoroscopically. Under sterile conditions and local anesthesia, the RIGHT common femoral artery access was performed with a micropuncture needle. Under direct ultrasound guidance, the RIGHT common femoral was accessed with a micropuncture kit. An ultrasound image was saved for documentation purposes. This allowed for placement of a 5 Fr 35 cm vascular sheath. A limited  arteriogram was performed through the side arm of the sheath confirming appropriate access within the RIGHT common femoral artery. A limited abdominal aortogram was performed to help identify the celiac artery origin. Over a Bentson wire, a C2 catheter was advanced, back bled and flushed. The catheter was then utilized to select the celiac access, then advanced into the common hepatic then gastroduodenal arteries. Selective mesenteric arteriograms were performed at each level. Initial coil deployment was performed with the 5 Fr catheter within the gastroduodenal artery, then a 6 mm 0.035 inch Terumo Azure was attempted to be deployed, however due deployment mechanism fraying was noted on reposition attempt. The 5 Fr sheath was exchanged for an 8 Fr 35 cm vascular sheath, then a 6 Fr snare was deployed. The frayed deployment mechanism was ensnared, and the mechanism and coil were removed intact. The C2 catheter was readvanced and utilized to select the celiac axis, and finally back into the GDA. Using a 2.4 Fr microcatheter and 0.016 inch Fathom microwire access into the gastroduodenal artery was performed and a selective arteriogram was performed. Selective embolization with multiple 0.018 inch micro coils was performed. The microcatheter was removed and arteriogram with the 5 Fr catheter at the common hepatic artery was performed. Adequate pruning of the GDA feeder arteries was achieved on post embolization arteriogram. Images were reviewed and the procedure was terminated. All wires, catheters and sheaths were removed from the patient. Hemostasis was achieved at the RIGHT groin access site with Angio-Seal closure device. The patient tolerated the procedure well without immediate post procedural complication. FINDINGS: *Celiac and common hepatic arteriograms with normal order and branching. *Gastroduodenal arteriogram without active extravasation noted. *Embolization with adequate pruning of the GDA distribution  arteries. *Access via the RIGHT femoral artery. Angio-Seal closure at RIGHT groin. Palpable RLE pulses at the end of the Case IMPRESSION: 1. 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 2. Successful empiric coil embolization of the gastroduodenal artery for refractory upper GI bleed. PLAN: - The patient is to remain flat for 2 hours with RIGHT leg straight. - The patient may continue to experience residual melena however should resolve in the coming days. Thom  Mugweru, MD [facility] Radiology Electronically Signed   By: Thom Hall M.D.   On: 06/26/2024 10:26   IR Angiogram Visceral Selective Result Date: 06/26/2024 INDICATION: Refractory upper GI bleed s/p endoscopy. EXAM: Title; COIL EMBOLIZATION OF GASTRODUODENAL ARTERY Listed procedures; 1.  ULTRASOUND GUIDANCE FOR ARTERIAL ACCESS 2. MESENTERIC ARTERIOGRAPHY, INCLUDING CELIAC, COMMON HEPATIC and GASTRODUODENAL ARTERIOGRAMS 3.  COIL EMBOLIZATION OF GASTRODUODENAL ARTERY COMPARISON:  CTA abdomen pelvis, 06/24/2024 MEDICATIONS: 4 mg Zofran  IV.  50 mg Benadryl  IV. ANESTHESIA/SEDATION: Moderate (conscious) sedation was employed during this procedure. A total of Versed  1.5 mg and Fentanyl  75 mcg was administered intravenously. Moderate Sedation Time: 89 minutes. The patient's level of consciousness and vital signs were monitored continuously by radiology nursing throughout the procedure under my direct supervision. CONTRAST:  100 mL Omnipaque  300 FLUOROSCOPY: Radiation Exposure Index and estimated peak skin dose (PSD); Reference air kerma (RAK), 448.5 mGy. Kerma-area product (KAP), 7499.3 uGy*m. COMPLICATIONS: None immediate. PROCEDURE: Informed consent was obtained from the patient and/or patient's representative following explanation of the procedure, risks, benefits and alternatives. All questions were addressed. A time out was performed prior to the initiation  of the procedure. Maximal barrier sterile technique utilized including caps, mask, sterile gowns, sterile gloves, large sterile drape, hand hygiene, and chlorhexidine  prep. The RIGHT femoral head was marked fluoroscopically. Under sterile conditions and local anesthesia, the RIGHT common femoral artery access was performed with a micropuncture needle. Under direct ultrasound guidance, the RIGHT common femoral was accessed with a micropuncture kit. An ultrasound image was saved for documentation purposes. This allowed for placement of a 5 Fr 35 cm vascular sheath. A limited arteriogram was performed through the side arm of the sheath confirming appropriate access within the RIGHT common femoral artery. A limited abdominal aortogram was performed to help identify the celiac artery origin. Over a Bentson wire, a C2 catheter was advanced, back bled and flushed. The catheter was then utilized to select the celiac access, then advanced into the common hepatic then gastroduodenal arteries. Selective mesenteric arteriograms were performed at each level. Initial coil deployment was performed with the 5 Fr catheter within the gastroduodenal artery, then a 6 mm 0.035 inch Terumo Azure was attempted to be deployed, however due deployment mechanism fraying was noted on reposition attempt. The 5 Fr sheath was exchanged for an 8 Fr 35 cm vascular sheath, then a 6 Fr snare was deployed. The frayed deployment mechanism was ensnared, and the mechanism and coil were removed intact. The C2 catheter was readvanced and utilized to select the celiac axis, and finally back into the GDA. Using a 2.4 Fr microcatheter and 0.016 inch Fathom microwire access into the gastroduodenal artery was performed and a selective arteriogram was performed. Selective embolization with multiple 0.018 inch micro coils was performed. The microcatheter was removed and arteriogram with the 5 Fr catheter at the common hepatic artery was performed. Adequate  pruning of the GDA feeder arteries was achieved on post embolization arteriogram. Images were reviewed and the procedure was terminated. All wires, catheters and sheaths were removed from the patient. Hemostasis was achieved at the RIGHT groin access site with Angio-Seal closure device. The patient tolerated the procedure well without immediate post procedural complication. FINDINGS: *Celiac and common hepatic arteriograms with normal order and branching. *Gastroduodenal arteriogram without active extravasation noted. *Embolization with adequate pruning of the GDA distribution arteries. *Access via the RIGHT femoral artery. Angio-Seal closure at RIGHT groin. Palpable RLE pulses at the end of the Case IMPRESSION:  1. 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 2. Successful empiric coil embolization of the gastroduodenal artery for refractory upper GI bleed. PLAN: - The patient is to remain flat for 2 hours with RIGHT leg straight. - The patient may continue to experience residual melena however should resolve in the coming days. Thom Hall, MD [facility] Electronically Signed   By: Thom Hall M.D.   On: 06/26/2024 10:26   CT ANGIO GI BLEED Result Date: 06/24/2024 CLINICAL DATA:  Melena with acute blood-loss and anemia. Status post exploratory laparotomy and Arlyss patch placement for duodenitis and duodenal ulcer with perforation on 06/15/2024. Clinical concern for determining if there is active gastrointestinal bleeding. EXAM: CTA ABDOMEN AND PELVIS WITHOUT AND WITH CONTRAST TECHNIQUE: Multidetector CT imaging of the abdomen and pelvis was performed using the standard protocol during bolus administration of intravenous contrast. Multiplanar reconstructed images and MIPs were obtained and reviewed to evaluate the vascular anatomy. RADIATION DOSE REDUCTION: This exam was performed according to the  departmental dose-optimization program which includes automated exposure control, adjustment of the mA and/or kV according to patient size and/or use of iterative reconstruction technique. CONTRAST:  OMNIPAQUE  IOHEXOL  350 MG/ML SOLN COMPARISON:  Abdomen and pelvis CT dated 06/15/2024. FINDINGS: VASCULAR Aorta: Atheromatous changes without stenosis, aneurysm or dissection. Celiac: Patent without evidence of aneurysm, dissection, vasculitis or significant stenosis. SMA: Patent without evidence of aneurysm, dissection, vasculitis or significant stenosis. Renals: 2 right renal arteries and 1 left renal artery. There is some plaque formation in the proximal, upper right renal artery producing approximately 50% stenosis. No plaque formation or stenosis involving the more inferior right renal artery. Calcified plaque at the origin of the left renal artery with a proximally 70% stenosis. IMA: Calcified plaque throughout the inferior aspect of the abdominal aorta without opacification of the inferior mesenteric artery. Inflow: Bilateral calcified plaque formation without aneurysm or significant stenosis. Proximal Outflow: Bilateral calcified plaque formation without aneurysm or significant stenosis. Veins: No obvious venous abnormality within the limitations of this arterial phase study. Review of the MIP images confirms the above findings. NON-VASCULAR Lower chest: Moderate-sized right pleural effusion and small to moderate-sized left pleural effusion. Associated bilateral lower lobe compressive atelectasis. The included portion of the heart is mildly enlarged with no pericardial effusion seen. Hepatobiliary: No focal liver abnormality is seen. Status post cholecystectomy. No biliary dilatation. Pancreas: Unremarkable. No pancreatic ductal dilatation or surrounding inflammatory changes. Spleen: Normal in size without focal abnormality. Adrenals/Urinary Tract: Foley catheter in the urinary bladder. Otherwise,  unremarkable bladder and adrenal glands. Simple appearing exophytic cyst arising from the left kidney, not needing imaging follow-up. 7 mm calculus in the upper left renal pelvis. Interval minimal dilatation of the left renal collecting system to the level of the ureteropelvic junction with no obstructing stone or mass seen. No ureteral dilatation. Stomach/Bowel: Interval diffuse low-density gastric wall thickening with mucosal enhancement, most pronounced involving the gastric antrum. Increased high density material in the 2nd portion of the duodenum on the initial postcontrast images, unchanged on the postcontrast images. No active contrast extravasation seen in the stomach or bowel. Status post partial colectomy including the proximal sigmoid colon. Enteric contrast in the excluded distal sigmoid colon and rectum, unchanged on the precontrast and postcontrast images. Multiple sigmoid colon diverticula in that region without evidence of diverticulitis. Unremarkable small bowel. Surgically absent appendix. 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 Inferior to the ostomy, large ventral hernia containing herniated  stomach, colon and small bowel is again demonstrated. No evidence of obstruction. Lymphatic: No enlarged lymph nodes. Reproductive: Status post hysterectomy. No adnexal masses. Other: Stable large ventral hernia containing herniated stomach, small bowel and colon without obstruction. An upper abdominal surgical drain is in place with no adjacent fluid. Musculoskeletal: Extensive lumbar and thoracic spine degenerative changes. IMPRESSION: 1. No active contrast extravasation seen in the stomach or bowel. 2. Interval diffuse low-density gastric wall thickening with mucosal enhancement, most pronounced involving the gastric antrum. This is consistent with gastritis. 3. Moderate-sized right pleural effusion and small to moderate-sized left pleural effusion with associated bilateral lower lobe compressive  atelectasis. 4. Mild cardiomegaly. 5. 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 6. 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 7. Interval minimal dilatation of the left renal collecting system to the level of the ureteropelvic junction with no obstructing stone or mass seen. This could be due to a recently passed stone or a UPJ obstruction. 8. Stable large ventral hernia containing herniated stomach, small bowel and colon without obstruction. 9. Status post partial colectomy including the proximal sigmoid colon. Enteric contrast in the excluded distal sigmoid colon and rectum, unchanged on the precontrast and postcontrast images. Multiple sigmoid colon diverticula in that region without evidence of diverticulitis. 10. 7 mm nonobstructing calculus in the upper left renal pelvis. 11. Aortic atherosclerosis. Aortic Atherosclerosis (ICD10-I70.0). Electronically Signed   By: Elspeth Bathe M.D.   On: 06/24/2024 15:00    Labs:  CBC: Recent Labs    06/26/24 1809 06/26/24 2215 06/27/24 0428 06/27/24 0500  WBC 8.3 8.6 10.5 11.3*  HGB 7.2* 7.3* 8.3* 8.1*  HCT 22.0* 22.2* 24.8* 25.3*  PLT 175 192 193 231    COAGS: Recent Labs    05/27/24 1144 05/27/24 1744 05/28/24 0456 06/15/24 2142  INR 1.3*  --   --  1.7*  APTT  --  29 30 29     BMP: Recent Labs    06/24/24 1858 06/25/24 0355 06/26/24 0445 06/27/24 0428  NA 133* 133* 136 134*  K 4.1 3.8 3.6 3.6  CL 100 100 103 102  CO2 25 26 24 24   GLUCOSE 97 90 76 112*  BUN 49* 52* 40* 36*  CALCIUM  7.8* 8.2* 7.8* 8.1*  CREATININE 1.10* 1.05* 1.18* 1.04*  GFRNONAA 54* 57* 50* 58*    LIVER FUNCTION TESTS: Recent Labs    06/17/24 0425 06/18/24 0031 06/19/24 0405 06/22/24 0459  BILITOT 0.8 0.7 0.6 0.6  AST 39 52* 64* 35  ALT 23 26 29 22   ALKPHOS 33* 38 40 42  PROT 3.7* 4.2* 4.0* 4.5*  ALBUMIN <1.5* <1.5* <1.5* <1.5*    Assessment and Plan: 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 Hughes Patient seen s/p embolization procedure 7/3.  HgB has stablized since procedure.  Ongoing dark stools, but no BRB.  Patient reports improved abdominal discomfort.  Procedure sites stable.  No needs in IR at present.   Electronically Signed: Leone Putman Sue-Ellen Lerlene Treadwell, PA 06/27/2024, 1:06 PM   I spent a total of 15 Minutes at the the patient's bedside AND on the patient's hospital floor or unit, greater than 50% of which was counseling/coordinating care for GI bleed.

## 2024-06-27 NOTE — Progress Notes (Signed)
 12 Days Post-Op   Subjective/Chief Complaint: Patient without complaint.   Objective: Vital signs in last 24 hours: Temp:  [97.5 F (36.4 C)-98.5 F (36.9 C)] 98.2 F (36.8 C) (07/05 0800) Pulse Rate:  [49-94] 60 (07/05 0800) Resp:  [11-23] 13 (07/05 0800) BP: (89-127)/(42-64) 111/53 (07/05 0800) SpO2:  [94 %-100 %] 98 % (07/05 0800) Last BM Date : 06/26/24  Intake/Output from previous day: 07/04 0701 - 07/05 0700 In: 10 [I.V.:10] Out: 875 [Urine:750; Drains:75; Stool:50] Intake/Output this shift: No intake/output data recorded.  Abdomen: Wound is intact and dressing in place.  JP serosanguineous.  Ostomy with melena in bag.  No gross blood.  Lab Results:  Recent Labs    06/27/24 0428 06/27/24 0500  WBC 10.5 11.3*  HGB 8.3* 8.1*  HCT 24.8* 25.3*  PLT 193 231   BMET Recent Labs    06/26/24 0445 06/27/24 0428  NA 136 134*  K 3.6 3.6  CL 103 102  CO2 24 24  GLUCOSE 76 112*  BUN 40* 36*  CREATININE 1.18* 1.04*  CALCIUM  7.8* 8.1*   PT/INR No results for input(s): LABPROT, INR in the last 72 hours. ABG No results for input(s): PHART, HCO3 in the last 72 hours.  Invalid input(s): PCO2, PO2  Studies/Results: IR Transcath/Emboliz Result Date: 06/26/2024 INDICATION: Refractory upper GI bleed s/p endoscopy. EXAM: Title; COIL EMBOLIZATION OF GASTRODUODENAL ARTERY Listed procedures; 1.  ULTRASOUND GUIDANCE FOR ARTERIAL ACCESS 2. MESENTERIC ARTERIOGRAPHY, INCLUDING CELIAC, COMMON HEPATIC and GASTRODUODENAL ARTERIOGRAMS 3.  COIL EMBOLIZATION OF GASTRODUODENAL ARTERY COMPARISON:  CTA abdomen pelvis, 06/24/2024 MEDICATIONS: 4 mg Zofran  IV.  50 mg Benadryl  IV. ANESTHESIA/SEDATION: Moderate (conscious) sedation was employed during this procedure. A total of Versed  1.5 mg and Fentanyl  75 mcg was administered intravenously. Moderate Sedation Time: 89 minutes. The patient's level of consciousness and vital signs were monitored continuously by radiology nursing  throughout the procedure under my direct supervision. CONTRAST:  100 mL Omnipaque  300 FLUOROSCOPY: Radiation Exposure Index and estimated peak skin dose (PSD); Reference air kerma (RAK), 448.5 mGy. Kerma-area product (KAP), 7499.3 uGy*m. COMPLICATIONS: None immediate. PROCEDURE: Informed consent was obtained from the patient and/or patient's representative following explanation of the procedure, risks, benefits and alternatives. All questions were addressed. A time out was performed prior to the initiation of the procedure. Maximal barrier sterile technique utilized including caps, mask, sterile gowns, sterile gloves, large sterile drape, hand hygiene, and chlorhexidine  prep. The RIGHT femoral head was marked fluoroscopically. Under sterile conditions and local anesthesia, the RIGHT common femoral artery access was performed with a micropuncture needle. Under direct ultrasound guidance, the RIGHT common femoral was accessed with a micropuncture kit. An ultrasound image was saved for documentation purposes. This allowed for placement of a 5 Fr 35 cm vascular sheath. A limited arteriogram was performed through the side arm of the sheath confirming appropriate access within the RIGHT common femoral artery. A limited abdominal aortogram was performed to help identify the celiac artery origin. Over a Bentson wire, a C2 catheter was advanced, back bled and flushed. The catheter was then utilized to select the celiac access, then advanced into the common hepatic then gastroduodenal arteries. Selective mesenteric arteriograms were performed at each level. Initial coil deployment was performed with the 5 Fr catheter within the gastroduodenal artery, then a 6 mm 0.035 inch Terumo Azure was attempted to be deployed, however due deployment mechanism fraying was noted on reposition attempt. The 5 Fr sheath was exchanged for an 8 Fr 35 cm vascular sheath, then  a 6 Fr snare was deployed. The frayed deployment mechanism was  ensnared, and the mechanism and coil were removed intact. The C2 catheter was readvanced and utilized to select the celiac axis, and finally back into the GDA. Using a 2.4 Fr microcatheter and 0.016 inch Fathom microwire access into the gastroduodenal artery was performed and a selective arteriogram was performed. Selective embolization with multiple 0.018 inch micro coils was performed. The microcatheter was removed and arteriogram with the 5 Fr catheter at the common hepatic artery was performed. Adequate pruning of the GDA feeder arteries was achieved on post embolization arteriogram. Images were reviewed and the procedure was terminated. All wires, catheters and sheaths were removed from the patient. Hemostasis was achieved at the RIGHT groin access site with Angio-Seal closure device. The patient tolerated the procedure well without immediate post procedural complication. FINDINGS: *Celiac and common hepatic arteriograms with normal order and branching. *Gastroduodenal arteriogram without active extravasation noted. *Embolization with adequate pruning of the GDA distribution arteries. *Access via the RIGHT femoral artery. Angio-Seal closure at RIGHT groin. Palpable RLE pulses at the end of the Case IMPRESSION: 1. 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 2. Successful empiric coil embolization of the gastroduodenal artery for refractory upper GI bleed. PLAN: - The patient is to remain flat for 2 hours with RIGHT leg straight. - The patient may continue to experience residual melena however should resolve in the coming days. Thom Hall, MD [facility] Radiology Electronically Signed   By: Thom Hall M.D.   On: 06/26/2024 10:26   IR US  Guide Vasc Access Right Result Date: 06/26/2024 INDICATION: Refractory upper GI bleed s/p endoscopy. EXAM: Title; COIL EMBOLIZATION OF GASTRODUODENAL ARTERY Listed  procedures; 1.  ULTRASOUND GUIDANCE FOR ARTERIAL ACCESS 2. MESENTERIC ARTERIOGRAPHY, INCLUDING CELIAC, COMMON HEPATIC and GASTRODUODENAL ARTERIOGRAMS 3.  COIL EMBOLIZATION OF GASTRODUODENAL ARTERY COMPARISON:  CTA abdomen pelvis, 06/24/2024 MEDICATIONS: 4 mg Zofran  IV.  50 mg Benadryl  IV. ANESTHESIA/SEDATION: Moderate (conscious) sedation was employed during this procedure. A total of Versed  1.5 mg and Fentanyl  75 mcg was administered intravenously. Moderate Sedation Time: 89 minutes. The patient's level of consciousness and vital signs were monitored continuously by radiology nursing throughout the procedure under my direct supervision. CONTRAST:  100 mL Omnipaque  300 FLUOROSCOPY: Radiation Exposure Index and estimated peak skin dose (PSD); Reference air kerma (RAK), 448.5 mGy. Kerma-area product (KAP), 7499.3 uGy*m. COMPLICATIONS: None immediate. PROCEDURE: Informed consent was obtained from the patient and/or patient's representative following explanation of the procedure, risks, benefits and alternatives. All questions were addressed. A time out was performed prior to the initiation of the procedure. Maximal barrier sterile technique utilized including caps, mask, sterile gowns, sterile gloves, large sterile drape, hand hygiene, and chlorhexidine  prep. The RIGHT femoral head was marked fluoroscopically. Under sterile conditions and local anesthesia, the RIGHT common femoral artery access was performed with a micropuncture needle. Under direct ultrasound guidance, the RIGHT common femoral was accessed with a micropuncture kit. An ultrasound image was saved for documentation purposes. This allowed for placement of a 5 Fr 35 cm vascular sheath. A limited arteriogram was performed through the side arm of the sheath confirming appropriate access within the RIGHT common femoral artery. A limited abdominal aortogram was performed to help identify the celiac artery origin. Over a Bentson wire, a C2 catheter was  advanced, back bled and flushed. The catheter was then utilized to select the celiac access, then advanced into the common hepatic then gastroduodenal  arteries. Selective mesenteric arteriograms were performed at each level. Initial coil deployment was performed with the 5 Fr catheter within the gastroduodenal artery, then a 6 mm 0.035 inch Terumo Azure was attempted to be deployed, however due deployment mechanism fraying was noted on reposition attempt. The 5 Fr sheath was exchanged for an 8 Fr 35 cm vascular sheath, then a 6 Fr snare was deployed. The frayed deployment mechanism was ensnared, and the mechanism and coil were removed intact. The C2 catheter was readvanced and utilized to select the celiac axis, and finally back into the GDA. Using a 2.4 Fr microcatheter and 0.016 inch Fathom microwire access into the gastroduodenal artery was performed and a selective arteriogram was performed. Selective embolization with multiple 0.018 inch micro coils was performed. The microcatheter was removed and arteriogram with the 5 Fr catheter at the common hepatic artery was performed. Adequate pruning of the GDA feeder arteries was achieved on post embolization arteriogram. Images were reviewed and the procedure was terminated. All wires, catheters and sheaths were removed from the patient. Hemostasis was achieved at the RIGHT groin access site with Angio-Seal closure device. The patient tolerated the procedure well without immediate post procedural complication. FINDINGS: *Celiac and common hepatic arteriograms with normal order and branching. *Gastroduodenal arteriogram without active extravasation noted. *Embolization with adequate pruning of the GDA distribution arteries. *Access via the RIGHT femoral artery. Angio-Seal closure at RIGHT groin. Palpable RLE pulses at the end of the Case IMPRESSION: 1. 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 2. Successful empiric  coil embolization of the gastroduodenal artery for refractory upper GI bleed. PLAN: - The patient is to remain flat for 2 hours with RIGHT leg straight. - The patient may continue to experience residual melena however should resolve in the coming days. Thom Hall, MD [facility] Radiology Electronically Signed   By: Thom Hall M.D.   On: 06/26/2024 10:26   IR Angiogram Visceral Selective Result Date: 06/26/2024 INDICATION: Refractory upper GI bleed s/p endoscopy. EXAM: Title; COIL EMBOLIZATION OF GASTRODUODENAL ARTERY Listed procedures; 1.  ULTRASOUND GUIDANCE FOR ARTERIAL ACCESS 2. MESENTERIC ARTERIOGRAPHY, INCLUDING CELIAC, COMMON HEPATIC and GASTRODUODENAL ARTERIOGRAMS 3.  COIL EMBOLIZATION OF GASTRODUODENAL ARTERY COMPARISON:  CTA abdomen pelvis, 06/24/2024 MEDICATIONS: 4 mg Zofran  IV.  50 mg Benadryl  IV. ANESTHESIA/SEDATION: Moderate (conscious) sedation was employed during this procedure. A total of Versed  1.5 mg and Fentanyl  75 mcg was administered intravenously. Moderate Sedation Time: 89 minutes. The patient's level of consciousness and vital signs were monitored continuously by radiology nursing throughout the procedure under my direct supervision. CONTRAST:  100 mL Omnipaque  300 FLUOROSCOPY: Radiation Exposure Index and estimated peak skin dose (PSD); Reference air kerma (RAK), 448.5 mGy. Kerma-area product (KAP), 7499.3 uGy*m. COMPLICATIONS: None immediate. PROCEDURE: Informed consent was obtained from the patient and/or patient's representative following explanation of the procedure, risks, benefits and alternatives. All questions were addressed. A time out was performed prior to the initiation of the procedure. Maximal barrier sterile technique utilized including caps, mask, sterile gowns, sterile gloves, large sterile drape, hand hygiene, and chlorhexidine  prep. The RIGHT femoral head was marked fluoroscopically. Under sterile conditions and local  anesthesia, the RIGHT common femoral artery access was performed with a micropuncture needle. Under direct ultrasound guidance, the RIGHT common femoral was accessed with a micropuncture kit. An ultrasound image was saved for documentation purposes. This allowed for placement of a 5 Fr 35 cm vascular sheath. A limited arteriogram was performed through the  side arm of the sheath confirming appropriate access within the RIGHT common femoral artery. A limited abdominal aortogram was performed to help identify the celiac artery origin. Over a Bentson wire, a C2 catheter was advanced, back bled and flushed. The catheter was then utilized to select the celiac access, then advanced into the common hepatic then gastroduodenal arteries. Selective mesenteric arteriograms were performed at each level. Initial coil deployment was performed with the 5 Fr catheter within the gastroduodenal artery, then a 6 mm 0.035 inch Terumo Azure was attempted to be deployed, however due deployment mechanism fraying was noted on reposition attempt. The 5 Fr sheath was exchanged for an 8 Fr 35 cm vascular sheath, then a 6 Fr snare was deployed. The frayed deployment mechanism was ensnared, and the mechanism and coil were removed intact. The C2 catheter was readvanced and utilized to select the celiac axis, and finally back into the GDA. Using a 2.4 Fr microcatheter and 0.016 inch Fathom microwire access into the gastroduodenal artery was performed and a selective arteriogram was performed. Selective embolization with multiple 0.018 inch micro coils was performed. The microcatheter was removed and arteriogram with the 5 Fr catheter at the common hepatic artery was performed. Adequate pruning of the GDA feeder arteries was achieved on post embolization arteriogram. Images were reviewed and the procedure was terminated. All wires, catheters and sheaths were removed from the patient. Hemostasis was achieved at the RIGHT groin access site with  Angio-Seal closure device. The patient tolerated the procedure well without immediate post procedural complication. FINDINGS: *Celiac and common hepatic arteriograms with normal order and branching. *Gastroduodenal arteriogram without active extravasation noted. *Embolization with adequate pruning of the GDA distribution arteries. *Access via the RIGHT femoral artery. Angio-Seal closure at RIGHT groin. Palpable RLE pulses at the end of the Case IMPRESSION: 1. 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 2. Successful empiric coil embolization of the gastroduodenal artery for refractory upper GI bleed. PLAN: - The patient is to remain flat for 2 hours with RIGHT leg straight. - The patient may continue to experience residual melena however should resolve in the coming days. Thom Hall, MD [facility] Radiology Electronically Signed   By: Thom Hall M.D.   On: 06/26/2024 10:26   IR Angiogram Visceral Selective Result Date: 06/26/2024 INDICATION: Refractory upper GI bleed s/p endoscopy. EXAM: Title; COIL EMBOLIZATION OF GASTRODUODENAL ARTERY Listed procedures; 1.  ULTRASOUND GUIDANCE FOR ARTERIAL ACCESS 2. MESENTERIC ARTERIOGRAPHY, INCLUDING CELIAC, COMMON HEPATIC and GASTRODUODENAL ARTERIOGRAMS 3.  COIL EMBOLIZATION OF GASTRODUODENAL ARTERY COMPARISON:  CTA abdomen pelvis, 06/24/2024 MEDICATIONS: 4 mg Zofran  IV.  50 mg Benadryl  IV. ANESTHESIA/SEDATION: Moderate (conscious) sedation was employed during this procedure. A total of Versed  1.5 mg and Fentanyl  75 mcg was administered intravenously. Moderate Sedation Time: 89 minutes. The patient's level of consciousness and vital signs were monitored continuously by radiology nursing throughout the procedure under my direct supervision. CONTRAST:  100 mL Omnipaque  300 FLUOROSCOPY: Radiation Exposure Index and estimated peak skin dose (PSD); Reference air kerma (RAK),  448.5 mGy. Kerma-area product (KAP), 7499.3 uGy*m. COMPLICATIONS: None immediate. PROCEDURE: Informed consent was obtained from the patient and/or patient's representative following explanation of the procedure, risks, benefits and alternatives. All questions were addressed. A time out was performed prior to the initiation of the procedure. Maximal barrier sterile technique utilized including caps, mask, sterile gowns, sterile gloves, large sterile drape, hand hygiene, and chlorhexidine  prep. The RIGHT femoral head was marked fluoroscopically. Under  sterile conditions and local anesthesia, the RIGHT common femoral artery access was performed with a micropuncture needle. Under direct ultrasound guidance, the RIGHT common femoral was accessed with a micropuncture kit. An ultrasound image was saved for documentation purposes. This allowed for placement of a 5 Fr 35 cm vascular sheath. A limited arteriogram was performed through the side arm of the sheath confirming appropriate access within the RIGHT common femoral artery. A limited abdominal aortogram was performed to help identify the celiac artery origin. Over a Bentson wire, a C2 catheter was advanced, back bled and flushed. The catheter was then utilized to select the celiac access, then advanced into the common hepatic then gastroduodenal arteries. Selective mesenteric arteriograms were performed at each level. Initial coil deployment was performed with the 5 Fr catheter within the gastroduodenal artery, then a 6 mm 0.035 inch Terumo Azure was attempted to be deployed, however due deployment mechanism fraying was noted on reposition attempt. The 5 Fr sheath was exchanged for an 8 Fr 35 cm vascular sheath, then a 6 Fr snare was deployed. The frayed deployment mechanism was ensnared, and the mechanism and coil were removed intact. The C2 catheter was readvanced and utilized to select the celiac axis, and finally back into the GDA. Using a 2.4 Fr microcatheter and  0.016 inch Fathom microwire access into the gastroduodenal artery was performed and a selective arteriogram was performed. Selective embolization with multiple 0.018 inch micro coils was performed. The microcatheter was removed and arteriogram with the 5 Fr catheter at the common hepatic artery was performed. Adequate pruning of the GDA feeder arteries was achieved on post embolization arteriogram. Images were reviewed and the procedure was terminated. All wires, catheters and sheaths were removed from the patient. Hemostasis was achieved at the RIGHT groin access site with Angio-Seal closure device. The patient tolerated the procedure well without immediate post procedural complication. FINDINGS: *Celiac and common hepatic arteriograms with normal order and branching. *Gastroduodenal arteriogram without active extravasation noted. *Embolization with adequate pruning of the GDA distribution arteries. *Access via the RIGHT femoral artery. Angio-Seal closure at RIGHT groin. Palpable RLE pulses at the end of the Case IMPRESSION: 1. 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 2. Successful empiric coil embolization of the gastroduodenal artery for refractory upper GI bleed. PLAN: - The patient is to remain flat for 2 hours with RIGHT leg straight. - The patient may continue to experience residual melena however should resolve in the coming days. Thom Hall, MD [facility] Radiology Electronically Signed   By: Thom Hall M.D.   On: 06/26/2024 10:26   IR Angiogram Visceral Selective Result Date: 06/26/2024 INDICATION: Refractory upper GI bleed s/p endoscopy. EXAM: Title; COIL EMBOLIZATION OF GASTRODUODENAL ARTERY Listed procedures; 1.  ULTRASOUND GUIDANCE FOR ARTERIAL ACCESS 2. MESENTERIC ARTERIOGRAPHY, INCLUDING CELIAC, COMMON HEPATIC and GASTRODUODENAL ARTERIOGRAMS 3.  COIL EMBOLIZATION OF GASTRODUODENAL ARTERY  COMPARISON:  CTA abdomen pelvis, 06/24/2024 MEDICATIONS: 4 mg Zofran  IV.  50 mg Benadryl  IV. ANESTHESIA/SEDATION: Moderate (conscious) sedation was employed during this procedure. A total of Versed  1.5 mg and Fentanyl  75 mcg was administered intravenously. Moderate Sedation Time: 89 minutes. The patient's level of consciousness and vital signs were monitored continuously by radiology nursing throughout the procedure under my direct supervision. CONTRAST:  100 mL Omnipaque  300 FLUOROSCOPY: Radiation Exposure Index and estimated peak skin dose (PSD); Reference air kerma (RAK), 448.5 mGy. Kerma-area product (KAP), 7499.3 uGy*m. COMPLICATIONS: None immediate. PROCEDURE: Informed consent was obtained from  the patient and/or patient's representative following explanation of the procedure, risks, benefits and alternatives. All questions were addressed. A time out was performed prior to the initiation of the procedure. Maximal barrier sterile technique utilized including caps, mask, sterile gowns, sterile gloves, large sterile drape, hand hygiene, and chlorhexidine  prep. The RIGHT femoral head was marked fluoroscopically. Under sterile conditions and local anesthesia, the RIGHT common femoral artery access was performed with a micropuncture needle. Under direct ultrasound guidance, the RIGHT common femoral was accessed with a micropuncture kit. An ultrasound image was saved for documentation purposes. This allowed for placement of a 5 Fr 35 cm vascular sheath. A limited arteriogram was performed through the side arm of the sheath confirming appropriate access within the RIGHT common femoral artery. A limited abdominal aortogram was performed to help identify the celiac artery origin. Over a Bentson wire, a C2 catheter was advanced, back bled and flushed. The catheter was then utilized to select the celiac access, then advanced into the common hepatic then gastroduodenal arteries. Selective mesenteric arteriograms were  performed at each level. Initial coil deployment was performed with the 5 Fr catheter within the gastroduodenal artery, then a 6 mm 0.035 inch Terumo Azure was attempted to be deployed, however due deployment mechanism fraying was noted on reposition attempt. The 5 Fr sheath was exchanged for an 8 Fr 35 cm vascular sheath, then a 6 Fr snare was deployed. The frayed deployment mechanism was ensnared, and the mechanism and coil were removed intact. The C2 catheter was readvanced and utilized to select the celiac axis, and finally back into the GDA. Using a 2.4 Fr microcatheter and 0.016 inch Fathom microwire access into the gastroduodenal artery was performed and a selective arteriogram was performed. Selective embolization with multiple 0.018 inch micro coils was performed. The microcatheter was removed and arteriogram with the 5 Fr catheter at the common hepatic artery was performed. Adequate pruning of the GDA feeder arteries was achieved on post embolization arteriogram. Images were reviewed and the procedure was terminated. All wires, catheters and sheaths were removed from the patient. Hemostasis was achieved at the RIGHT groin access site with Angio-Seal closure device. The patient tolerated the procedure well without immediate post procedural complication. FINDINGS: *Celiac and common hepatic arteriograms with normal order and branching. *Gastroduodenal arteriogram without active extravasation noted. *Embolization with adequate pruning of the GDA distribution arteries. *Access via the RIGHT femoral artery. Angio-Seal closure at RIGHT groin. Palpable RLE pulses at the end of the Case IMPRESSION: 1. 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 2. Successful empiric coil embolization of the gastroduodenal artery for refractory upper GI bleed. PLAN: - The patient is to remain flat for 2 hours with RIGHT leg straight. - The patient may continue to experience  residual melena however should resolve in the coming days. Thom Hall, MD [facility] Radiology Special[facility] Radiology Electronically Signed   By: Thom Hall M.D.   On: 06/26/2024 10:26    Anti-infectives: Anti-infectives (From admission, onward)    Start     Dose/Rate Route Frequency Ordered Stop   06/16/24 1800  vancomycin  (VANCOCIN ) IVPB 1000 mg/200 mL premix  Status:  Discontinued        1,000 mg 200 mL/hr over 60 Minutes Intravenous Every 24 hours 06/15/24 1317 06/15/24 1328   06/15/24 1700  vancomycin  (VANCOREADY) IVPB 2000 mg/400 mL  Status:  Discontinued        2,000 mg 200 mL/hr over 120 Minutes Intravenous  Once  06/15/24 1317 06/15/24 1328   06/15/24 1600  fluconazole  (DIFLUCAN ) IVPB 400 mg  Status:  Discontinued        400 mg 100 mL/hr over 120 Minutes Intravenous Every 24 hours 06/15/24 1317 06/16/24 0914   06/15/24 1500  metroNIDAZOLE  (FLAGYL ) IVPB 500 mg  Status:  Discontinued        500 mg 100 mL/hr over 60 Minutes Intravenous Every 12 hours 06/15/24 1216 06/15/24 1255   06/15/24 1400  ciprofloxacin  (CIPRO ) IVPB 400 mg  Status:  Discontinued        400 mg 200 mL/hr over 60 Minutes Intravenous Every 12 hours 06/15/24 1216 06/15/24 1255   06/15/24 1400  piperacillin -tazobactam (ZOSYN ) IVPB 3.375 g        3.375 g 12.5 mL/hr over 240 Minutes Intravenous Every 8 hours 06/15/24 1317 06/20/24 2359   06/15/24 1400  vancomycin  (VANCOCIN ) IVPB 1000 mg/200 mL premix  Status:  Discontinued        1,000 mg 200 mL/hr over 60 Minutes Intravenous Every 24 hours 06/15/24 1328 06/16/24 0914   06/15/24 1333  vancomycin  (VANCOCIN ) 1-5 GM/200ML-% IVPB       Note to Pharmacy: Romona Shaver E: cabinet override      06/15/24 1333 06/15/24 1410   06/15/24 1221  ciprofloxacin  (CIPRO ) 400 MG/200ML IVPB  Status:  Discontinued       Note to Pharmacy: Dannielle Railing S: cabinet override      06/15/24 1221 06/15/24 1343   06/15/24 1221  metroNIDAZOLE  (FLAGYL ) 500  MG/100ML IVPB  Status:  Discontinued       Note to Pharmacy: Dannielle Railing S: cabinet override      06/15/24 1221 06/15/24 1343   06/12/24 1223  clindamycin  (CLEOCIN ) 900 MG/50ML IVPB       Note to Pharmacy: Elaine Nest L: cabinet override      06/12/24 1223 06/12/24 1332   06/12/24 1131  ceFAZolin  (ANCEF ) 2-4 GM/100ML-% IVPB  Status:  Discontinued       Note to Pharmacy: Billy Sluder H: cabinet override      06/12/24 1131 06/12/24 1434   06/11/24 1300  clindamycin  (CLEOCIN ) IVPB 900 mg  Status:  Discontinued        900 mg 100 mL/hr over 30 Minutes Intravenous On call to O.R. 06/10/24 1041 06/10/24 1447   06/11/24 1300  ceFAZolin  (ANCEF ) IVPB 2g/100 mL premix        2 g 200 mL/hr over 30 Minutes Intravenous On call to O.R. 06/10/24 1447 06/12/24 1908       Assessment/Plan:   Expand All Collapse All 11 Days Post-Op    Subjective/Chief Complaint: PT WANTS SOMETHING TO DRINK  NOT GETTING PAIN MEDS      Objective: Vital signs in last 24 hours: Temp:  [97.5 F (36.4 C)-98.4 F (36.9 C)] 97.6 F (36.4 C) (07/04 0800) Pulse Rate:  [48-68] 62 (07/04 0800) Resp:  [11-24] 20 (07/04 0800) BP: (92-125)/(39-93) 120/62 (07/04 0800) SpO2:  [92 %-100 %] 94 % (07/04 0800) Weight:  [887 kg] 112 kg (07/04 0541) Last BM Date : 06/25/24   Intake/Output from previous day: 07/03 0701 - 07/04 0700 In: 538.4 [I.V.:223.4; Blood:315] Out: 2095 [Urine:1850; Drains:85; Stool:160] Intake/Output this shift: No intake/output data recorded.   Abd: soft, appropriately tender, midline wound clean and honeycomb in place, JP with serosang output, colostomy with melena present.    Lab Results:  Recent Labs (last 2 labs)      Recent Labs    06/25/24 1541  06/26/24 0445  WBC 7.2 8.5  HGB 9.2* 8.5*  HCT 27.8* 26.0*  PLT 195 190      BMET Recent Labs (last 2 labs)      Recent Labs    06/25/24 0355 06/26/24 0445  NA 133* 136  K 3.8 3.6  CL 100 103  CO2 26 24  GLUCOSE 90 76   BUN 52* 40*  CREATININE 1.05* 1.18*  CALCIUM  8.2* 7.8*      PT/INR Recent Labs (last 2 labs)  No results for input(s): LABPROT, INR in the last 72 hours.   ABG  Recent Labs (last 2 labs)  No results for input(s): PHART, HCO3 in the last 72 hours.   Invalid input(s): PCO2, PO2     Studies/Results:  Imaging Results (Last 48 hours)  IR Transcath/Emboliz Result Date: 06/26/2024 INDICATION: Refractory upper GI bleed s/p endoscopy. EXAM: Title; COIL EMBOLIZATION OF GASTRODUODENAL ARTERY Listed procedures; 1.  ULTRASOUND GUIDANCE FOR ARTERIAL ACCESS 2. MESENTERIC ARTERIOGRAPHY, INCLUDING CELIAC, COMMON HEPATIC and GASTRODUODENAL ARTERIOGRAMS 3.  COIL EMBOLIZATION OF GASTRODUODENAL ARTERY COMPARISON:  CTA abdomen pelvis, 06/24/2024 MEDICATIONS: 4 mg Zofran  IV.  50 mg Benadryl  IV. ANESTHESIA/SEDATION: Moderate (conscious) sedation was employed during this procedure. A total of Versed  1.5 mg and Fentanyl  75 mcg was administered intravenously. Moderate Sedation Time: 89 minutes. The patient's level of consciousness and vital signs were monitored continuously by radiology nursing throughout the procedure under my direct supervision. CONTRAST:  100 mL Omnipaque  300 FLUOROSCOPY: Radiation Exposure Index and estimated peak skin dose (PSD); Reference air kerma (RAK), 448.5 mGy. Kerma-area product (KAP), 7499.3 uGy*m. COMPLICATIONS: None immediate. PROCEDURE: Informed consent was obtained from the patient and/or patient's representative following explanation of the procedure, risks, benefits and alternatives. All questions were addressed. A time out was performed prior to the initiation of the procedure. Maximal barrier sterile technique utilized including caps, mask, sterile gowns, sterile gloves, large sterile drape, hand hygiene, and chlorhexidine  prep. The RIGHT femoral head was marked fluoroscopically. Under sterile conditions and local anesthesia, the RIGHT common femoral artery access was  performed with a micropuncture needle. Under direct ultrasound guidance, the RIGHT common femoral was accessed with a micropuncture kit. An ultrasound image was saved for documentation purposes. This allowed for placement of a 5 Fr 35 cm vascular sheath. A limited arteriogram was performed through the side arm of the sheath confirming appropriate access within the RIGHT common femoral artery. A limited abdominal aortogram was performed to help identify the celiac artery origin. Over a Bentson wire, a C2 catheter was advanced, back bled and flushed. The catheter was then utilized to select the celiac access, then advanced into the common hepatic then gastroduodenal arteries. Selective mesenteric arteriograms were performed at each level. Initial coil deployment was performed with the 5 Fr catheter within the gastroduodenal artery, then a 6 mm 0.035 inch Terumo Azure was attempted to be deployed, however due deployment mechanism fraying was noted on reposition attempt. The 5 Fr sheath was exchanged for an 8 Fr 35 cm vascular sheath, then a 6 Fr snare was deployed. The frayed deployment mechanism was ensnared, and the mechanism and coil were removed intact. The C2 catheter was readvanced and utilized to select the celiac axis, and finally back into the GDA. Using a 2.4 Fr microcatheter and 0.016 inch Fathom microwire access into the gastroduodenal artery was performed and a selective arteriogram was performed. Selective embolization with multiple 0.018 inch micro coils was performed. The microcatheter was removed and arteriogram with the 5 Fr  catheter at the common hepatic artery was performed. Adequate pruning of the GDA feeder arteries was achieved on post embolization arteriogram. Images were reviewed and the procedure was terminated. All wires, catheters and sheaths were removed from the patient. Hemostasis was achieved at the RIGHT groin access site with Angio-Seal closure device. The patient tolerated the  procedure well without immediate post procedural complication. FINDINGS: *Celiac and common hepatic arteriograms with normal order and branching. *Gastroduodenal arteriogram without active extravasation noted. *Embolization with adequate pruning of the GDA distribution arteries. *Access via the RIGHT femoral artery. Angio-Seal closure at RIGHT groin. Palpable RLE pulses at the end of the Case IMPRESSION: 1. 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 2. Successful empiric coil embolization of the gastroduodenal artery for refractory upper GI bleed. PLAN: - The patient is to remain flat for 2 hours with RIGHT leg straight. - The patient may continue to experience residual melena however should resolve in the coming days. Thom Hall, MD [facility] Radiology Electronically Signed   By: Thom Hall M.D.   On: 06/26/2024 10:26    IR US  Guide Vasc Access Right Result Date: 06/26/2024 INDICATION: Refractory upper GI bleed s/p endoscopy. EXAM: Title; COIL EMBOLIZATION OF GASTRODUODENAL ARTERY Listed procedures; 1.  ULTRASOUND GUIDANCE FOR ARTERIAL ACCESS 2. MESENTERIC ARTERIOGRAPHY, INCLUDING CELIAC, COMMON HEPATIC and GASTRODUODENAL ARTERIOGRAMS 3.  COIL EMBOLIZATION OF GASTRODUODENAL ARTERY COMPARISON:  CTA abdomen pelvis, 06/24/2024 MEDICATIONS: 4 mg Zofran  IV.  50 mg Benadryl  IV. ANESTHESIA/SEDATION: Moderate (conscious) sedation was employed during this procedure. A total of Versed  1.5 mg and Fentanyl  75 mcg was administered intravenously. Moderate Sedation Time: 89 minutes. The patient's level of consciousness and vital signs were monitored continuously by radiology nursing throughout the procedure under my direct supervision. CONTRAST:  100 mL Omnipaque  300 FLUOROSCOPY: Radiation Exposure Index and estimated peak skin dose (PSD); Reference air kerma (RAK), 448.5 mGy. Kerma-area product (KAP), 7499.3 uGy*m.  COMPLICATIONS: None immediate. PROCEDURE: Informed consent was obtained from the patient and/or patient's representative following explanation of the procedure, risks, benefits and alternatives. All questions were addressed. A time out was performed prior to the initiation of the procedure. Maximal barrier sterile technique utilized including caps, mask, sterile gowns, sterile gloves, large sterile drape, hand hygiene, and chlorhexidine  prep. The RIGHT femoral head was marked fluoroscopically. Under sterile conditions and local anesthesia, the RIGHT common femoral artery access was performed with a micropuncture needle. Under direct ultrasound guidance, the RIGHT common femoral was accessed with a micropuncture kit. An ultrasound image was saved for documentation purposes. This allowed for placement of a 5 Fr 35 cm vascular sheath. A limited arteriogram was performed through the side arm of the sheath confirming appropriate access within the RIGHT common femoral artery. A limited abdominal aortogram was performed to help identify the celiac artery origin. Over a Bentson wire, a C2 catheter was advanced, back bled and flushed. The catheter was then utilized to select the celiac access, then advanced into the common hepatic then gastroduodenal arteries. Selective mesenteric arteriograms were performed at each level. Initial coil deployment was performed with the 5 Fr catheter within the gastroduodenal artery, then a 6 mm 0.035 inch Terumo Azure was attempted to be deployed, however due deployment mechanism fraying was noted on reposition attempt. The 5 Fr sheath was exchanged for an 8 Fr 35 cm vascular sheath, then a 6 Fr snare was deployed. The frayed deployment mechanism was ensnared, and the mechanism and coil were removed intact.  The C2 catheter was readvanced and utilized to select the celiac axis, and finally back into the GDA. Using a 2.4 Fr microcatheter and 0.016 inch Fathom microwire access into the  gastroduodenal artery was performed and a selective arteriogram was performed. Selective embolization with multiple 0.018 inch micro coils was performed. The microcatheter was removed and arteriogram with the 5 Fr catheter at the common hepatic artery was performed. Adequate pruning of the GDA feeder arteries was achieved on post embolization arteriogram. Images were reviewed and the procedure was terminated. All wires, catheters and sheaths were removed from the patient. Hemostasis was achieved at the RIGHT groin access site with Angio-Seal closure device. The patient tolerated the procedure well without immediate post procedural complication. FINDINGS: *Celiac and common hepatic arteriograms with normal order and branching. *Gastroduodenal arteriogram without active extravasation noted. *Embolization with adequate pruning of the GDA distribution arteries. *Access via the RIGHT femoral artery. Angio-Seal closure at RIGHT groin. Palpable RLE pulses at the end of the Case IMPRESSION: 1. 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 2. Successful empiric coil embolization of the gastroduodenal artery for refractory upper GI bleed. PLAN: - The patient is to remain flat for 2 hours with RIGHT leg straight. - The patient may continue to experience residual melena however should resolve in the coming days. Thom Hall, MD [facility] Radiology Electronically Signed   By: Thom Hall M.D.   On: 06/26/2024 10:26    IR Angiogram Visceral Selective Result Date: 06/26/2024 INDICATION: Refractory upper GI bleed s/p endoscopy. EXAM: Title; COIL EMBOLIZATION OF GASTRODUODENAL ARTERY Listed procedures; 1.  ULTRASOUND GUIDANCE FOR ARTERIAL ACCESS 2. MESENTERIC ARTERIOGRAPHY, INCLUDING CELIAC, COMMON HEPATIC and GASTRODUODENAL ARTERIOGRAMS 3.  COIL EMBOLIZATION OF GASTRODUODENAL ARTERY COMPARISON:  CTA abdomen pelvis, 06/24/2024  MEDICATIONS: 4 mg Zofran  IV.  50 mg Benadryl  IV. ANESTHESIA/SEDATION: Moderate (conscious) sedation was employed during this procedure. A total of Versed  1.5 mg and Fentanyl  75 mcg was administered intravenously. Moderate Sedation Time: 89 minutes. The patient's level of consciousness and vital signs were monitored continuously by radiology nursing throughout the procedure under my direct supervision. CONTRAST:  100 mL Omnipaque  300 FLUOROSCOPY: Radiation Exposure Index and estimated peak skin dose (PSD); Reference air kerma (RAK), 448.5 mGy. Kerma-area product (KAP), 7499.3 uGy*m. COMPLICATIONS: None immediate. PROCEDURE: Informed consent was obtained from the patient and/or patient's representative following explanation of the procedure, risks, benefits and alternatives. All questions were addressed. A time out was performed prior to the initiation of the procedure. Maximal barrier sterile technique utilized including caps, mask, sterile gowns, sterile gloves, large sterile drape, hand hygiene, and chlorhexidine  prep. The RIGHT femoral head was marked fluoroscopically. Under sterile conditions and local anesthesia, the RIGHT common femoral artery access was performed with a micropuncture needle. Under direct ultrasound guidance, the RIGHT common femoral was accessed with a micropuncture kit. An ultrasound image was saved for documentation purposes. This allowed for placement of a 5 Fr 35 cm vascular sheath. A limited arteriogram was performed through the side arm of the sheath confirming appropriate access within the RIGHT common femoral artery. A limited abdominal aortogram was performed to help identify the celiac artery origin. Over a Bentson wire, a C2 catheter was advanced, back bled and flushed. The catheter was then utilized to select the celiac access, then advanced into the common hepatic then gastroduodenal arteries. Selective mesenteric arteriograms were performed at each level. Initial coil deployment  was performed with the 5 Fr catheter within the  gastroduodenal artery, then a 6 mm 0.035 inch Terumo Azure was attempted to be deployed, however due deployment mechanism fraying was noted on reposition attempt. The 5 Fr sheath was exchanged for an 8 Fr 35 cm vascular sheath, then a 6 Fr snare was deployed. The frayed deployment mechanism was ensnared, and the mechanism and coil were removed intact. The C2 catheter was readvanced and utilized to select the celiac axis, and finally back into the GDA. Using a 2.4 Fr microcatheter and 0.016 inch Fathom microwire access into the gastroduodenal artery was performed and a selective arteriogram was performed. Selective embolization with multiple 0.018 inch micro coils was performed. The microcatheter was removed and arteriogram with the 5 Fr catheter at the common hepatic artery was performed. Adequate pruning of the GDA feeder arteries was achieved on post embolization arteriogram. Images were reviewed and the procedure was terminated. All wires, catheters and sheaths were removed from the patient. Hemostasis was achieved at the RIGHT groin access site with Angio-Seal closure device. The patient tolerated the procedure well without immediate post procedural complication. FINDINGS: *Celiac and common hepatic arteriograms with normal order and branching. *Gastroduodenal arteriogram without active extravasation noted. *Embolization with adequate pruning of the GDA distribution arteries. *Access via the RIGHT femoral artery. Angio-Seal closure at RIGHT groin. Palpable RLE pulses at the end of the Case IMPRESSION: 1. 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 2. Successful empiric coil embolization of the gastroduodenal artery for refractory upper GI bleed. PLAN: - The patient is to remain flat for 2 hours with RIGHT leg straight. - The patient may continue to experience residual melena however should resolve in the coming  days. Thom Hall, MD [facility] Radiology Electronically Signed   By: Thom Hall M.D.   On: 06/26/2024 10:26    IR Angiogram Visceral Selective Result Date: 06/26/2024 INDICATION: Refractory upper GI bleed s/p endoscopy. EXAM: Title; COIL EMBOLIZATION OF GASTRODUODENAL ARTERY Listed procedures; 1.  ULTRASOUND GUIDANCE FOR ARTERIAL ACCESS 2. MESENTERIC ARTERIOGRAPHY, INCLUDING CELIAC, COMMON HEPATIC and GASTRODUODENAL ARTERIOGRAMS 3.  COIL EMBOLIZATION OF GASTRODUODENAL ARTERY COMPARISON:  CTA abdomen pelvis, 06/24/2024 MEDICATIONS: 4 mg Zofran  IV.  50 mg Benadryl  IV. ANESTHESIA/SEDATION: Moderate (conscious) sedation was employed during this procedure. A total of Versed  1.5 mg and Fentanyl  75 mcg was administered intravenously. Moderate Sedation Time: 89 minutes. The patient's level of consciousness and vital signs were monitored continuously by radiology nursing throughout the procedure under my direct supervision. CONTRAST:  100 mL Omnipaque  300 FLUOROSCOPY: Radiation Exposure Index and estimated peak skin dose (PSD); Reference air kerma (RAK), 448.5 mGy. Kerma-area product (KAP), 7499.3 uGy*m. COMPLICATIONS: None immediate. PROCEDURE: Informed consent was obtained from the patient and/or patient's representative following explanation of the procedure, risks, benefits and alternatives. All questions were addressed. A time out was performed prior to the initiation of the procedure. Maximal barrier sterile technique utilized including caps, mask, sterile gowns, sterile gloves, large sterile drape, hand hygiene, and chlorhexidine  prep. The RIGHT femoral head was marked fluoroscopically. Under sterile conditions and local anesthesia, the RIGHT common femoral artery access was performed with a micropuncture needle. Under direct ultrasound guidance, the RIGHT common femoral was accessed with a micropuncture kit. An ultrasound image was saved for documentation  purposes. This allowed for placement of a 5 Fr 35 cm vascular sheath. A limited arteriogram was performed through the side arm of the sheath confirming appropriate access within the RIGHT common femoral artery. A limited abdominal aortogram was performed  to help identify the celiac artery origin. Over a Bentson wire, a C2 catheter was advanced, back bled and flushed. The catheter was then utilized to select the celiac access, then advanced into the common hepatic then gastroduodenal arteries. Selective mesenteric arteriograms were performed at each level. Initial coil deployment was performed with the 5 Fr catheter within the gastroduodenal artery, then a 6 mm 0.035 inch Terumo Azure was attempted to be deployed, however due deployment mechanism fraying was noted on reposition attempt. The 5 Fr sheath was exchanged for an 8 Fr 35 cm vascular sheath, then a 6 Fr snare was deployed. The frayed deployment mechanism was ensnared, and the mechanism and coil were removed intact. The C2 catheter was readvanced and utilized to select the celiac axis, and finally back into the GDA. Using a 2.4 Fr microcatheter and 0.016 inch Fathom microwire access into the gastroduodenal artery was performed and a selective arteriogram was performed. Selective embolization with multiple 0.018 inch micro coils was performed. The microcatheter was removed and arteriogram with the 5 Fr catheter at the common hepatic artery was performed. Adequate pruning of the GDA feeder arteries was achieved on post embolization arteriogram. Images were reviewed and the procedure was terminated. All wires, catheters and sheaths were removed from the patient. Hemostasis was achieved at the RIGHT groin access site with Angio-Seal closure device. The patient tolerated the procedure well without immediate post procedural complication. FINDINGS: *Celiac and common hepatic arteriograms with normal order and branching. *Gastroduodenal arteriogram without active  extravasation noted. *Embolization with adequate pruning of the GDA distribution arteries. *Access via the RIGHT femoral artery. Angio-Seal closure at RIGHT groin. Palpable RLE pulses at the end of the Case IMPRESSION: 1. 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 2. Successful empiric coil embolization of the gastroduodenal artery for refractory upper GI bleed. PLAN: - The patient is to remain flat for 2 hours with RIGHT leg straight. - The patient may continue to experience residual melena however should resolve in the coming days. Thom Hall, MD [facility] Radiology Electronically Signed   By: Thom Hall M.D.   On: 06/26/2024 10:26    IR Angiogram Visceral Selective Result Date: 06/26/2024 INDICATION: Refractory upper GI bleed s/p endoscopy. EXAM: Title; COIL EMBOLIZATION OF GASTRODUODENAL ARTERY Listed procedures; 1.  ULTRASOUND GUIDANCE FOR ARTERIAL ACCESS 2. MESENTERIC ARTERIOGRAPHY, INCLUDING CELIAC, COMMON HEPATIC and GASTRODUODENAL ARTERIOGRAMS 3.  COIL EMBOLIZATION OF GASTRODUODENAL ARTERY COMPARISON:  CTA abdomen pelvis, 06/24/2024 MEDICATIONS: 4 mg Zofran  IV.  50 mg Benadryl  IV. ANESTHESIA/SEDATION: Moderate (conscious) sedation was employed during this procedure. A total of Versed  1.5 mg and Fentanyl  75 mcg was administered intravenously. Moderate Sedation Time: 89 minutes. The patient's level of consciousness and vital signs were monitored continuously by radiology nursing throughout the procedure under my direct supervision. CONTRAST:  100 mL Omnipaque  300 FLUOROSCOPY: Radiation Exposure Index and estimated peak skin dose (PSD); Reference air kerma (RAK), 448.5 mGy. Kerma-area product (KAP), 7499.3 uGy*m. COMPLICATIONS: None immediate. PROCEDURE: Informed consent was obtained from the patient and/or patient's representative following explanation of the procedure, risks, benefits and  alternatives. All questions were addressed. A time out was performed prior to the initiation of the procedure. Maximal barrier sterile technique utilized including caps, mask, sterile gowns, sterile gloves, large sterile drape, hand hygiene, and chlorhexidine  prep. The RIGHT femoral head was marked fluoroscopically. Under sterile conditions and local anesthesia, the RIGHT common femoral artery access was performed with a micropuncture needle. Under direct  ultrasound guidance, the RIGHT common femoral was accessed with a micropuncture kit. An ultrasound image was saved for documentation purposes. This allowed for placement of a 5 Fr 35 cm vascular sheath. A limited arteriogram was performed through the side arm of the sheath confirming appropriate access within the RIGHT common femoral artery. A limited abdominal aortogram was performed to help identify the celiac artery origin. Over a Bentson wire, a C2 catheter was advanced, back bled and flushed. The catheter was then utilized to select the celiac access, then advanced into the common hepatic then gastroduodenal arteries. Selective mesenteric arteriograms were performed at each level. Initial coil deployment was performed with the 5 Fr catheter within the gastroduodenal artery, then a 6 mm 0.035 inch Terumo Azure was attempted to be deployed, however due deployment mechanism fraying was noted on reposition attempt. The 5 Fr sheath was exchanged for an 8 Fr 35 cm vascular sheath, then a 6 Fr snare was deployed. The frayed deployment mechanism was ensnared, and the mechanism and coil were removed intact. The C2 catheter was readvanced and utilized to select the celiac axis, and finally back into the GDA. Using a 2.4 Fr microcatheter and 0.016 inch Fathom microwire access into the gastroduodenal artery was performed and a selective arteriogram was performed. Selective embolization with multiple 0.018 inch micro coils was performed. The microcatheter was removed and  arteriogram with the 5 Fr catheter at the common hepatic artery was performed. Adequate pruning of the GDA feeder arteries was achieved on post embolization arteriogram. Images were reviewed and the procedure was terminated. All wires, catheters and sheaths were removed from the patient. Hemostasis was achieved at the RIGHT groin access site with Angio-Seal closure device. The patient tolerated the procedure well without immediate post procedural complication. FINDINGS: *Celiac and common hepatic arteriograms with normal order and branching. *Gastroduodenal arteriogram without active extravasation noted. *Embolization with adequate pruning of the GDA distribution arteries. *Access via the RIGHT femoral artery. Angio-Seal closure at RIGHT groin. Palpable RLE pulses at the end of the Case IMPRESSION: 1. 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 2. Successful empiric coil embolization of the gastroduodenal artery for refractory upper GI bleed. PLAN: - The patient is to remain flat for 2 hours with RIGHT leg straight. - The patient may continue to experience residual melena however should resolve in the coming days. Thom Hall, MD [facility] Electronically Signed   By: Thom Hall M.D.   On: 06/26/2024 10:26    CT ANGIO GI BLEED Result Date: 06/24/2024 CLINICAL DATA:  Melena with acute blood-loss and anemia. Status post exploratory laparotomy and Arlyss patch placement for duodenitis and duodenal ulcer with perforation on 06/15/2024. Clinical concern for determining if there is active gastrointestinal bleeding. EXAM: CTA ABDOMEN AND PELVIS WITHOUT AND WITH CONTRAST TECHNIQUE: Multidetector CT imaging of the abdomen and pelvis was performed using the standard protocol during bolus administration of intravenous contrast. Multiplanar reconstructed images and MIPs were obtained and reviewed to evaluate the  vascular anatomy. RADIATION DOSE REDUCTION: This exam was performed according to the departmental dose-optimization program which includes automated exposure control, adjustment of the mA and/or kV according to patient size and/or use of iterative reconstruction technique. CONTRAST:  OMNIPAQUE  IOHEXOL  350 MG/ML SOLN COMPARISON:  Abdomen and pelvis CT dated 06/15/2024. FINDINGS: VASCULAR Aorta: Atheromatous changes without stenosis, aneurysm or dissection. Celiac: Patent without evidence of aneurysm, dissection, vasculitis or significant stenosis. SMA: Patent without evidence of aneurysm, dissection,  vasculitis or significant stenosis. Renals: 2 right renal arteries and 1 left renal artery. There is some plaque formation in the proximal, upper right renal artery producing approximately 50% stenosis. No plaque formation or stenosis involving the more inferior right renal artery. Calcified plaque at the origin of the left renal artery with a proximally 70% stenosis. IMA: Calcified plaque throughout the inferior aspect of the abdominal aorta without opacification of the inferior mesenteric artery. Inflow: Bilateral calcified plaque formation without aneurysm or significant stenosis. Proximal Outflow: Bilateral calcified plaque formation without aneurysm or significant stenosis. Veins: No obvious venous abnormality within the limitations of this arterial phase study. Review of the MIP images confirms the above findings. NON-VASCULAR Lower chest: Moderate-sized right pleural effusion and small to moderate-sized left pleural effusion. Associated bilateral lower lobe compressive atelectasis. The included portion of the heart is mildly enlarged with no pericardial effusion seen. Hepatobiliary: No focal liver abnormality is seen. Status post cholecystectomy. No biliary dilatation. Pancreas: Unremarkable. No pancreatic ductal dilatation or surrounding inflammatory changes. Spleen: Normal in size without focal  abnormality. Adrenals/Urinary Tract: Foley catheter in the urinary bladder. Otherwise, unremarkable bladder and adrenal glands. Simple appearing exophytic cyst arising from the left kidney, not needing imaging follow-up. 7 mm calculus in the upper left renal pelvis. Interval minimal dilatation of the left renal collecting system to the level of the ureteropelvic junction with no obstructing stone or mass seen. No ureteral dilatation. Stomach/Bowel: Interval diffuse low-density gastric wall thickening with mucosal enhancement, most pronounced involving the gastric antrum. Increased high density material in the 2nd portion of the duodenum on the initial postcontrast images, unchanged on the postcontrast images. No active contrast extravasation seen in the stomach or bowel. Status post partial colectomy including the proximal sigmoid colon. Enteric contrast in the excluded distal sigmoid colon and rectum, unchanged on the precontrast and postcontrast images. Multiple sigmoid colon diverticula in that region without evidence of diverticulitis. Unremarkable small bowel. Surgically absent appendix. 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 Inferior to the ostomy, large ventral hernia containing herniated stomach, colon and small bowel is again demonstrated. No evidence of obstruction. Lymphatic: No enlarged lymph nodes. Reproductive: Status post hysterectomy. No adnexal masses. Other: Stable large ventral hernia containing herniated stomach, small bowel and colon without obstruction. An upper abdominal surgical drain is in place with no adjacent fluid. Musculoskeletal: Extensive lumbar and thoracic spine degenerative changes. IMPRESSION: 1. No active contrast extravasation seen in the stomach or bowel. 2. Interval diffuse low-density gastric wall thickening with mucosal enhancement, most pronounced involving the gastric antrum. This is consistent with gastritis. 3. Moderate-sized right pleural effusion and small  to moderate-sized left pleural effusion with associated bilateral lower lobe compressive atelectasis. 4. Mild cardiomegaly. 5. 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 6. 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 7. Interval minimal dilatation of the left renal collecting system to the level of the ureteropelvic junction with no obstructing stone or mass seen. This could be due to a recently passed stone or a UPJ obstruction. 8. Stable large ventral hernia containing herniated stomach, small bowel and colon without obstruction. 9. Status post partial colectomy including the proximal sigmoid colon. Enteric contrast in the excluded distal sigmoid colon and rectum, unchanged on the precontrast and postcontrast images. Multiple sigmoid colon diverticula in that region without evidence of diverticulitis. 10. 7 mm nonobstructing calculus in the upper left renal pelvis. 11. Aortic atherosclerosis. Aortic Atherosclerosis (ICD10-I70.0).  Electronically Signed   By: Elspeth Bathe M.D.   On: 06/24/2024 15:00       Anti-infectives: Anti-infectives (From admission, onward)        Start     Dose/Rate Route Frequency Ordered Stop    06/16/24 1800   vancomycin  (VANCOCIN ) IVPB 1000 mg/200 mL premix  Status:  Discontinued        1,000 mg 200 mL/hr over 60 Minutes Intravenous Every 24 hours 06/15/24 1317 06/15/24 1328    06/15/24 1700   vancomycin  (VANCOREADY) IVPB 2000 mg/400 mL  Status:  Discontinued        2,000 mg 200 mL/hr over 120 Minutes Intravenous  Once 06/15/24 1317 06/15/24 1328    06/15/24 1600   fluconazole  (DIFLUCAN ) IVPB 400 mg  Status:  Discontinued        400 mg 100 mL/hr over 120 Minutes Intravenous Every 24 hours 06/15/24 1317 06/16/24 0914    06/15/24 1500   metroNIDAZOLE  (FLAGYL ) IVPB 500 mg  Status:  Discontinued        500 mg 100 mL/hr over 60 Minutes Intravenous Every 12 hours 06/15/24 1216 06/15/24 1255     06/15/24 1400   ciprofloxacin  (CIPRO ) IVPB 400 mg  Status:  Discontinued        400 mg 200 mL/hr over 60 Minutes Intravenous Every 12 hours 06/15/24 1216 06/15/24 1255    06/15/24 1400   piperacillin -tazobactam (ZOSYN ) IVPB 3.375 g        3.375 g 12.5 mL/hr over 240 Minutes Intravenous Every 8 hours 06/15/24 1317 06/20/24 2359    06/15/24 1400   vancomycin  (VANCOCIN ) IVPB 1000 mg/200 mL premix  Status:  Discontinued        1,000 mg 200 mL/hr over 60 Minutes Intravenous Every 24 hours 06/15/24 1328 06/16/24 0914    06/15/24 1333   vancomycin  (VANCOCIN ) 1-5 GM/200ML-% IVPB       Note to Pharmacy: Romona Shaver E: cabinet override         06/15/24 1333 06/15/24 1410    06/15/24 1221   ciprofloxacin  (CIPRO ) 400 MG/200ML IVPB  Status:  Discontinued       Note to Pharmacy: Dannielle Railing S: cabinet override         06/15/24 1221 06/15/24 1343    06/15/24 1221   metroNIDAZOLE  (FLAGYL ) 500 MG/100ML IVPB  Status:  Discontinued       Note to Pharmacy: Dannielle Railing S: cabinet override         06/15/24 1221 06/15/24 1343    06/12/24 1223   clindamycin  (CLEOCIN ) 900 MG/50ML IVPB       Note to Pharmacy: Elaine Nest L: cabinet override         06/12/24 1223 06/12/24 1332    06/12/24 1131   ceFAZolin  (ANCEF ) 2-4 GM/100ML-% IVPB  Status:  Discontinued       Note to Pharmacy: Billy Sluder H: cabinet override         06/12/24 1131 06/12/24 1434    06/11/24 1300   clindamycin  (CLEOCIN ) IVPB 900 mg  Status:  Discontinued        900 mg 100 mL/hr over 30 Minutes Intravenous On call to O.R. 06/10/24 1041 06/10/24 1447    06/11/24 1300   ceFAZolin  (ANCEF ) IVPB 2g/100 mL premix        2 g 200 mL/hr over 30 Minutes Intravenous On call to O.R. 06/10/24 1447 06/12/24 1908             Assessment/Plan: POD  11 , s/p ex lap with graham patch repair and JP drain placement for Perforated duodenal ulcer, possible bleeding ulcer as well, Dr. Johanna at [facility] 6/23, now with melena -IR embolized  yesterday old blood in colostomy noted   -h. Pylori negative -will actually leave her surgical drain for right now.  If she requires embo, we will be able to monitor this to assure no leak from necrosis.  Ok to start clears  -multi-modal pain control -PT, OOB as able pending femur fx recs    FEN -clears advance to soft VTE - on hold due to anemia ID - [email or web address] , completed     LOS: 20 days    Christina Castaneda 06/27/2024

## 2024-06-28 DIAGNOSIS — K276 Chronic or unspecified peptic ulcer, site unspecified, with both hemorrhage and perforation: Secondary | ICD-10-CM | POA: Diagnosis not present

## 2024-06-28 DIAGNOSIS — N179 Acute kidney failure, unspecified: Secondary | ICD-10-CM | POA: Diagnosis not present

## 2024-06-28 DIAGNOSIS — G4733 Obstructive sleep apnea (adult) (pediatric): Secondary | ICD-10-CM | POA: Diagnosis not present

## 2024-06-28 DIAGNOSIS — J449 Chronic obstructive pulmonary disease, unspecified: Secondary | ICD-10-CM | POA: Diagnosis not present

## 2024-06-28 LAB — CBC
HCT: 21.1 % — ABNORMAL LOW (ref 36.0–46.0)
HCT: 23.1 % — ABNORMAL LOW (ref 36.0–46.0)
Hemoglobin: 6.8 g/dL — CL (ref 12.0–15.0)
Hemoglobin: 7.9 g/dL — ABNORMAL LOW (ref 12.0–15.0)
MCH: 31.6 pg (ref 26.0–34.0)
MCH: 31.6 pg (ref 26.0–34.0)
MCHC: 32.2 g/dL (ref 30.0–36.0)
MCHC: 34.2 g/dL (ref 30.0–36.0)
MCV: 98.1 fL (ref 80.0–100.0)
MCV: 92.4 fL (ref 80.0–100.0)
Platelets: 186 K/uL (ref 150–400)
Platelets: 201 K/uL (ref 150–400)
RBC: 2.15 MIL/uL — ABNORMAL LOW (ref 3.87–5.11)
RBC: 2.5 MIL/uL — ABNORMAL LOW (ref 3.87–5.11)
RDW: 18.4 % — ABNORMAL HIGH (ref 11.5–15.5)
RDW: 19.6 % — ABNORMAL HIGH (ref 11.5–15.5)
WBC: 8.5 K/uL (ref 4.0–10.5)
WBC: 8.9 K/uL (ref 4.0–10.5)
nRBC: 0 % (ref 0.0–0.2)
nRBC: 0 % (ref 0.0–0.2)

## 2024-06-28 LAB — PREPARE RBC (CROSSMATCH)

## 2024-06-28 MED ORDER — SODIUM CHLORIDE 0.9% IV SOLUTION: Freq: Once | INTRAVENOUS | Status: AC

## 2024-06-28 NOTE — Progress Notes (Addendum)
 PROGRESS NOTE        PATIENT DETAILS Name: Christina Castaneda Age: 68 y.o. Sex: female Date of Birth: 08/17/1956 Admit Date: 06/07/2024 Admitting Physician Orlin Fairly, MD ERE:Floopd, Mariano SQUIBB, DO  Brief Summary: Patient is a 68 y.o.  female with history of perforated sigmoid colon-s/p Hartman's/colostomy 2017-who initially presented to [facility] following a mechanical fall-she was found to have left distal femur fracture-underwent retrograde femoral nail on 6/20, unfortunately-further hospital course complicated by perforated duodenal ulcer with acute blood loss anemia resulting in hypovolemic shock-she was evaluated by general surgery and underwent emergent exploratory laparotomy with Graham's patch-postoperatively-she was transferred to [facility] ICU.  She was stabilized and then subsequently transferred to TRH service.  Unfortunately-further hospital course complicated by recurrent melena through ostomy and acute blood loss anemia-after discussion with [facility] and then with interventional radiology-patient underwent on GDA embolization on 7/4.  See below for further details.   Significant events: 6/15>> mechanical fall-left femur fracture/AKI-admit to TR[facility] at [facility]. 6/20>> retrograde femoral nail by orthopedics at [facility] 6/23>> perforated duodenal ulcer-hypotension-exploratory laparotomy/placement of Arlyss supplies.  Remained intubated postprocedure-transferred to [facility] ICU. 6/24>> extubated 6/25>> off pressors-out of ICU-drop in hemoglobin but nonbloody ostomy output. 6/26>> transferred to TRH service 7/02>> Black tarry stools in ostomy-Hb down to 6.1-General Surgery reconsulted-too early for EGD-CT angio negative for bleed-IR deferred embolization 7/03 >> continues to have black tarry stools-IR performed GDA embolization. 7/06>> drop in Hb to 6.8-continues to have black stools in ostomy-1 unit of blood ordered.  Significant studies: 6/15>> CT left knee: Acute comminuted fracture of  distal left femoral shaft. 6/16>> renal ultrasound: No hydronephrosis 6/23>> CT angiogram abdomen: Perforated and bleeding peptic ulcer disease affecting proximal duodenum-appears to be hematoma in the gastric antrum/proximal duodenum. 6/26>> upper GI series with KUB: No evidence of contrast leak. 7/02>> CT angio GI bleed study: No active bleeding.  Significant microbiology data: 6/23>> blood culture: No growth 6/23>> peritoneal fluid: Abundant Candida albicans/moderate Candida glabrata.  Procedures: 6/20>> retrograde femoral nail by orthopedics at [facility] 6/23>> exploratory laparotomy with omental Graham's patch repair 7/03>> GDA embolization.  Consults: GI at [facility] General Surgery at [facility] and [facility] IR  Subjective: Continues to have black stools-no other complaints.  Objective: Vitals: Blood pressure 111/69, pulse 66, temperature 97.8 F (36.6 C), temperature source Oral, resp. rate 14, height 5' 5 (1.651 m), weight 108.5 kg, last menstrual period 11/18/2012, SpO2 94%.   Exam: Awake/alert Chest: Clear to auscultation CVS: S1-S2 regular Abdomen: Soft nontender nondistended Extremities: Trace edema Nonfocal exam  Pertinent Labs/Radiology:    Latest Ref Rng & Units 06/28/2024    3:45 AM 06/27/2024    5:00 AM 06/27/2024    4:28 AM  CBC  WBC 4.0 - 10.5 K/uL 8.5  11.3  10.5   Hemoglobin 12.0 - 15.0 g/dL 6.8  8.1  8.3   Hematocrit 36.0 - 46.0 % 21.1  25.3  24.8   Platelets 150 - 400 K/uL 186  231  193     Lab Results  Component Value Date   NA 134 (L) 06/27/2024   K 3.6 06/27/2024   CL 102 06/27/2024   CO2 24 06/27/2024     Assessment/Plan: Hemorrhagic and septic shock secondary to perforated bleeding duodenal ulcer with acute blood loss anemia-s/p exploratory laparotomy with Arlyss patch repair on 6/23 at [facility] Recurrent upper GI bleeding with acute  blood loss anemia noted on 7/2-CTA negative for extravasation-s/p GDA embolization by IR on 7/4. Had stabilized after  laparotomy with Arlyss patch repair-however had a recurrent upper GI bleed-with melanotic stool on 7/2-CTA negative for extravasation.  As discussed with general surgery-given recent postop status-not safe for EGD-hence IR consulted-subsequently underwent gastroduodenal artery embolization on 7/3.  Although was still having some melanotic appearing stools (likely old blood)-Hb was stable for the past several days (last transfusion was on 7/3).  Unfortunately-7/6-significant drop in hemoglobin down to 6.8-on 1 unit of PRBC being transfused.  Unclear if there are any other options apart from supportive care at this point-discussed with general surgical PA-seen this morning-Oshane Bent this a.m. to see how soon we can potentially get GI to perform EGD.   Continue IV PPI Await further recommendations from general surgery Follow CBC.   Mechanical fall with left femur fracture-s/p retrograde left femoral nail on 6/20 Per orthopedics-WBAT LLE, knee immobilizer when ambulating, remove staples in 2 weeks.  Follow-up with Dr. Oneil Salvage at [place] 2 weeks from discharge.  AKI Likely hemodynamically mediated Resolved  Acute urinary retention Foley catheter remains in place-will perform voiding trial in the next several days-if no further surgical procedures are planned.  HTN BP soft-continue to hold all antihypertensives  HLD Continue to hold Crestor -resume when able-currently n.p.o.  COPD Stable-no wheezing Continue bronchodilators Stop further steroids at this point.  OSA CPAP nightly  Hypothyroidism Synthroid   History of perforated sigmoid colon-s/p Hartman/colostomy 2017  Debility/deconditioning PT/OT eval-SNF recommended.  Nutrition Status: Nutrition Problem: Increased nutrient needs Etiology: acute illness, wound healing Signs/Symptoms: estimated needs Interventions: Refer to RD note for recommendations   Pressure Ulcer: Agree with assessment and plan as outlined  below. Pressure Injury 05/27/24 Sacrum Stage 2 -  Partial thickness loss of dermis presenting as a shallow open injury with a red, pink wound bed without slough. 4 small stage II present, each measures 0.4x 0.4 x 0.2 (Active)  05/27/24 1815  Location: Sacrum  Location Orientation:   Staging: Stage 2 -  Partial thickness loss of dermis presenting as a shallow open injury with a red, pink wound bed without slough.  Wound Description (Comments): 4 small stage II present, each measures 0.4x 0.4 x 0.2  DO NOT USE:  Present on Admission:   Dressing Type Foam - Lift dressing to assess site every shift;Impregnated gauze (bismuth) 06/27/24 2000    Morbid Obesity: Estimated body mass index is 39.8 kg/m as calculated from the following:   Height as of this encounter: 5' 5 (1.651 m).   Weight as of this encounter: 108.5 kg.   Code status:   Code Status: Full Code   DVT Prophylaxis: Place and maintain sequential compression device Start: 06/18/24 1325 SCDs Start: 06/15/24 1937 Place and maintain sequential compression device Start: 06/15/24 0155 SQ heparin  discontinued 7/2-due to high GI bleeding with ABLA.  Family Communication: Spouse Randall-[phone]-updated 7/3   Disposition Plan: Status is: Inpatient Remains inpatient appropriate because: Severity of illness   Planned Discharge Destination:Skilled nursing facility   Diet: Diet Order             DIET SOFT Fluid consistency: Thin  Diet effective now                     Antimicrobial agents: Anti-infectives (From admission, onward)    Start     Dose/Rate Route Frequency Ordered Stop   06/16/24 1800  vancomycin  (VANCOCIN ) IVPB 1000 mg/200 mL premix  Status:  Discontinued        1,000 mg 200 mL/hr over 60 Minutes Intravenous Every 24 hours 06/15/24 1317 06/15/24 1328   06/15/24 1700  vancomycin  (VANCOREADY) IVPB 2000 mg/400 mL  Status:  Discontinued        2,000 mg 200 mL/hr over 120 Minutes Intravenous  Once  06/15/24 1317 06/15/24 1328   06/15/24 1600  fluconazole  (DIFLUCAN ) IVPB 400 mg  Status:  Discontinued        400 mg 100 mL/hr over 120 Minutes Intravenous Every 24 hours 06/15/24 1317 06/16/24 0914   06/15/24 1500  metroNIDAZOLE  (FLAGYL ) IVPB 500 mg  Status:  Discontinued        500 mg 100 mL/hr over 60 Minutes Intravenous Every 12 hours 06/15/24 1216 06/15/24 1255   06/15/24 1400  ciprofloxacin  (CIPRO ) IVPB 400 mg  Status:  Discontinued        400 mg 200 mL/hr over 60 Minutes Intravenous Every 12 hours 06/15/24 1216 06/15/24 1255   06/15/24 1400  piperacillin -tazobactam (ZOSYN ) IVPB 3.375 g        3.375 g 12.5 mL/hr over 240 Minutes Intravenous Every 8 hours 06/15/24 1317 06/20/24 2359   06/15/24 1400  vancomycin  (VANCOCIN ) IVPB 1000 mg/200 mL premix  Status:  Discontinued        1,000 mg 200 mL/hr over 60 Minutes Intravenous Every 24 hours 06/15/24 1328 06/16/24 0914   06/15/24 1333  vancomycin  (VANCOCIN ) 1-5 GM/200ML-% IVPB       Note to Pharmacy: Romona Shaver E: cabinet override      06/15/24 1333 06/15/24 1410   06/15/24 1221  ciprofloxacin  (CIPRO ) 400 MG/200ML IVPB  Status:  Discontinued       Note to Pharmacy: Dannielle Railing S: cabinet override      06/15/24 1221 06/15/24 1343   06/15/24 1221  metroNIDAZOLE  (FLAGYL ) 500 MG/100ML IVPB  Status:  Discontinued       Note to Pharmacy: Dannielle Railing S: cabinet override      06/15/24 1221 06/15/24 1343   06/12/24 1223  clindamycin  (CLEOCIN ) 900 MG/50ML IVPB       Note to Pharmacy: Elaine Nest L: cabinet override      06/12/24 1223 06/12/24 1332   06/12/24 1131  ceFAZolin  (ANCEF ) 2-4 GM/100ML-% IVPB  Status:  Discontinued       Note to Pharmacy: Billy Sluder H: cabinet override      06/12/24 1131 06/12/24 1434   06/11/24 1300  clindamycin  (CLEOCIN ) IVPB 900 mg  Status:  Discontinued        900 mg 100 mL/hr over 30 Minutes Intravenous On call to O.R. 06/10/24 1041 06/10/24 1447   06/11/24 1300  ceFAZolin  (ANCEF ) IVPB  2g/100 mL premix        2 g 200 mL/hr over 30 Minutes Intravenous On call to O.R. 06/10/24 1447 06/12/24 1908        MEDICATIONS: Scheduled Meds:  arformoterol   15 mcg Nebulization Q12H   budesonide  (PULMICORT ) nebulizer solution  0.5 mg Nebulization BID   Chlorhexidine  Gluconate Cloth  6 each Topical Daily   feeding supplement  237 mL Oral BID BM   levothyroxine   150 mcg Oral Q0600   multivitamin with minerals  1 tablet Oral Daily   nutrition supplement (JUVEN)  1 packet Oral BID AC & HS   nystatin    Topical BID   pantoprazole  (PROTONIX ) IV  40 mg Intravenous Q12H   sodium chloride  flush  10-40 mL Intracatheter Q12H   sodium chloride  flush  3  mL Intravenous Q12H   sodium hypochlorite   Irrigation BID   Continuous Infusions: PRN Meds:.dextrose , iohexol , ipratropium-albuterol , magic mouthwash w/lidocaine , morphine  injection, naLOXone  (NARCAN )  injection, [DISCONTINUED] ondansetron  **OR** ondansetron  (ZOFRAN ) IV, mouth rinse, oxyCODONE , polyethylene glycol, prochlorperazine , sodium chloride  flush, sodium chloride  flush   I have personally reviewed following labs and imaging studies  LABORATORY DATA: CBC: Recent Labs  Lab 06/26/24 1809 06/26/24 2215 06/27/24 0428 06/27/24 0500 06/28/24 0345  WBC 8.3 8.6 10.5 11.3* 8.5  HGB 7.2* 7.3* 8.3* 8.1* 6.8*  HCT 22.0* 22.2* 24.8* 25.3* 21.1*  MCV 94.4 96.9 96.5 97.7 98.1  PLT 175 192 193 231 186    Basic Metabolic Panel: Recent Labs  Lab 06/22/24 0459 06/23/24 0408 06/24/24 1858 06/25/24 0355 06/26/24 0445 06/27/24 0428  NA 134* 132* 133* 133* 136 134*  K 4.3 4.2 4.1 3.8 3.6 3.6  CL 102 101 100 100 103 102  CO2 27 26 25 26 24 24   GLUCOSE 84 83 97 90 76 112*  BUN 26* 32* 49* 52* 40* 36*  CREATININE 1.02* 0.95 1.10* 1.05* 1.18* 1.04*  CALCIUM  8.0* 8.1* 7.8* 8.2* 7.8* 8.1*  MG 1.9  --   --   --   --   --   PHOS 3.1  --   --   --   --   --     GFR: Estimated Creatinine Clearance: 61.7 mL/min (A) (by C-G formula based  on SCr of 1.04 mg/dL (H)).  Liver Function Tests: Recent Labs  Lab 06/22/24 0459  AST 35  ALT 22  ALKPHOS 42  BILITOT 0.6  PROT 4.5*  ALBUMIN <1.5*   No results for input(s): LIPASE, AMYLASE in the last 168 hours. No results for input(s): AMMONIA in the last 168 hours.  Coagulation Profile: No results for input(s): INR, PROTIME in the last 168 hours.  Cardiac Enzymes: No results for input(s): CKTOTAL, CKMB, CKMBINDEX, TROPONINI in the last 168 hours.  BNP (last 3 results) No results for input(s): PROBNP in the last 8760 hours.  Lipid Profile: No results for input(s): CHOL, HDL, LDLCALC, TRIG, CHOLHDL, LDLDIRECT in the last 72 hours.   Thyroid  Function Tests: No results for input(s): TSH, T4TOTAL, FREET4, T3FREE, THYROIDAB in the last 72 hours.  Anemia Panel: No results for input(s): VITAMINB12, FOLATE, FERRITIN, TIBC, IRON, RETICCTPCT in the last 72 hours.  Urine analysis:    Component Value Date/Time   COLORURINE YELLOW 06/07/2024 2026   APPEARANCEUR HAZY (A) 06/07/2024 2026   LABSPEC 1.015 06/07/2024 2026   PHURINE 5.0 06/07/2024 2026   GLUCOSEU NEGATIVE 06/07/2024 2026   HGBUR NEGATIVE 06/07/2024 2026   BILIRUBINUR NEGATIVE 06/07/2024 2026   KETONESUR NEGATIVE 06/07/2024 2026   PROTEINUR NEGATIVE 06/07/2024 2026   NITRITE NEGATIVE 06/07/2024 2026   LEUKOCYTESUR NEGATIVE 06/07/2024 2026    Sepsis Labs: Lactic Acid, Venous    Component Value Date/Time   LATICACIDVEN 1.0 05/27/2024 1945    MICROBIOLOGY: No results found for this or any previous visit (from the past 240 hours).   RADIOLOGY STUDIES/RESULTS: No results found.    LOS: 21 days   Donalda Applebaum, MD  [facility]    To contact the attending provider between 7A-7P or the covering provider during after hours 7P-7A, please log into the web site [email or web address] and access using universal [facility] password for that web site. If  you do not have the password, please call the hospital operator.  06/28/2024, 9:34 AM

## 2024-06-28 NOTE — Progress Notes (Signed)
   06/28/24 2104  BiPAP/CPAP/SIPAP  Reason BIPAP/CPAP not in use Non-compliant  BiPAP/CPAP /SiPAP Vitals  Bilateral Breath Sounds Diminished   Pt refused CPAP for nighttime use. Currently maintaining SpO2 on 3LNC.

## 2024-06-28 NOTE — Plan of Care (Signed)
  Problem: Education: Goal: Knowledge of General Education information will improve Description: Including pain rating scale, medication(s)/side effects and non-pharmacologic comfort measures Outcome: Progressing   Problem: Health Behavior/Discharge Planning: Goal: Ability to manage health-related needs will improve Outcome: Progressing   Problem: Clinical Measurements: Goal: Ability to maintain clinical measurements within normal limits will improve Outcome: Progressing Goal: Will remain free from infection Outcome: Progressing Goal: Diagnostic test results will improve Outcome: Progressing Goal: Respiratory complications will improve Outcome: Progressing Goal: Cardiovascular complication will be avoided Outcome: Progressing   Problem: Activity: Goal: Risk for activity intolerance will decrease Outcome: Progressing   Problem: Nutrition: Goal: Adequate nutrition will be maintained Outcome: Progressing   Problem: Coping: Goal: Level of anxiety will decrease Outcome: Progressing   Problem: Elimination: Goal: Will not experience complications related to bowel motility Outcome: Progressing Goal: Will not experience complications related to urinary retention Outcome: Progressing   Problem: Pain Managment: Goal: General experience of comfort will improve and/or be controlled Outcome: Progressing   Problem: Safety: Goal: Ability to remain free from injury will improve Outcome: Progressing   Problem: Skin Integrity: Goal: Risk for impaired skin integrity will decrease Outcome: Progressing   Problem: Safety: Goal: Non-violent Restraint(s) Outcome: Progressing   Problem: Activity: Goal: Ability to tolerate increased activity will improve Outcome: Progressing   Problem: Respiratory: Goal: Ability to maintain a clear airway and adequate ventilation will improve Outcome: Progressing   Problem: Role Relationship: Goal: Method of communication will improve Outcome:  Progressing   Problem: Education: Goal: Understanding of CV disease, CV risk reduction, and recovery process will improve Outcome: Progressing Goal: Individualized Educational Video(s) Outcome: Progressing   Problem: Activity: Goal: Ability to return to baseline activity level will improve Outcome: Progressing   Problem: Cardiovascular: Goal: Ability to achieve and maintain adequate cardiovascular perfusion will improve Outcome: Progressing Goal: Vascular access site(s) Level 0-1 will be maintained Outcome: Progressing   Problem: Health Behavior/Discharge Planning: Goal: Ability to safely manage health-related needs after discharge will improve Outcome: Progressing

## 2024-06-28 NOTE — Progress Notes (Signed)
 13 Days Post-Op   Subjective/Chief Complaint: Patient has no complaints today. Tolerating clears, now on soft and had ostomy output.    Objective: Vital signs in last 24 hours: Temp:  [97.6 F (36.4 C)-98 F (36.7 C)] 97.8 F (36.6 C) (07/06 0800) Pulse Rate:  [51-66] 66 (07/06 0800) Resp:  [10-16] 14 (07/06 0800) BP: (94-117)/(45-69) 111/69 (07/06 0800) SpO2:  [91 %-98 %] 94 % (07/06 0800) Weight:  [108.5 kg] 108.5 kg (07/06 0404) Last BM Date : 06/27/24  Intake/Output from previous day: 07/05 0701 - 07/06 0700 In: 623 [P.O.:600; I.V.:23] Out: 1150 [Urine:1000; Drains:55; Stool:95] Intake/Output this shift: No intake/output data recorded.  Abdomen: Wound is intact and dressing in place. JP appears more serous.  Ostomy with some melena in bag.  No gross blood noted.   Lab Results:  Recent Labs    06/27/24 0500 06/28/24 0345  WBC 11.3* 8.5  HGB 8.1* 6.8*  HCT 25.3* 21.1*  PLT 231 186   BMET Recent Labs    06/26/24 0445 06/27/24 0428  NA 136 134*  K 3.6 3.6  CL 103 102  CO2 24 24  GLUCOSE 76 112*  BUN 40* 36*  CREATININE 1.18* 1.04*  CALCIUM  7.8* 8.1*   PT/INR No results for input(s): LABPROT, INR in the last 72 hours. ABG No results for input(s): PHART, HCO3 in the last 72 hours.  Invalid input(s): PCO2, PO2  Studies/Results: No results found.   Anti-infectives: Anti-infectives (From admission, onward)    Start     Dose/Rate Route Frequency Ordered Stop   06/16/24 1800  vancomycin  (VANCOCIN ) IVPB 1000 mg/200 mL premix  Status:  Discontinued        1,000 mg 200 mL/hr over 60 Minutes Intravenous Every 24 hours 06/15/24 1317 06/15/24 1328   06/15/24 1700  vancomycin  (VANCOREADY) IVPB 2000 mg/400 mL  Status:  Discontinued        2,000 mg 200 mL/hr over 120 Minutes Intravenous  Once 06/15/24 1317 06/15/24 1328   06/15/24 1600  fluconazole  (DIFLUCAN ) IVPB 400 mg  Status:  Discontinued        400 mg 100 mL/hr over 120 Minutes Intravenous  Every 24 hours 06/15/24 1317 06/16/24 0914   06/15/24 1500  metroNIDAZOLE  (FLAGYL ) IVPB 500 mg  Status:  Discontinued        500 mg 100 mL/hr over 60 Minutes Intravenous Every 12 hours 06/15/24 1216 06/15/24 1255   06/15/24 1400  ciprofloxacin  (CIPRO ) IVPB 400 mg  Status:  Discontinued        400 mg 200 mL/hr over 60 Minutes Intravenous Every 12 hours 06/15/24 1216 06/15/24 1255   06/15/24 1400  piperacillin -tazobactam (ZOSYN ) IVPB 3.375 g        3.375 g 12.5 mL/hr over 240 Minutes Intravenous Every 8 hours 06/15/24 1317 06/20/24 2359   06/15/24 1400  vancomycin  (VANCOCIN ) IVPB 1000 mg/200 mL premix  Status:  Discontinued        1,000 mg 200 mL/hr over 60 Minutes Intravenous Every 24 hours 06/15/24 1328 06/16/24 0914   06/15/24 1333  vancomycin  (VANCOCIN ) 1-5 GM/200ML-% IVPB       Note to Pharmacy: Romona Shaver E: cabinet override      06/15/24 1333 06/15/24 1410   06/15/24 1221  ciprofloxacin  (CIPRO ) 400 MG/200ML IVPB  Status:  Discontinued       Note to Pharmacy: Dannielle Railing S: cabinet override      06/15/24 1221 06/15/24 1343   06/15/24 1221  metroNIDAZOLE  (FLAGYL ) 500 MG/100ML IVPB  Status:  Discontinued       Note to Pharmacy: Dannielle Railing S: cabinet override      06/15/24 1221 06/15/24 1343   06/12/24 1223  clindamycin  (CLEOCIN ) 900 MG/50ML IVPB       Note to Pharmacy: Elaine Nest L: cabinet override      06/12/24 1223 06/12/24 1332   06/12/24 1131  ceFAZolin  (ANCEF ) 2-4 GM/100ML-% IVPB  Status:  Discontinued       Note to Pharmacy: Billy Sluder H: cabinet override      06/12/24 1131 06/12/24 1434   06/11/24 1300  clindamycin  (CLEOCIN ) IVPB 900 mg  Status:  Discontinued        900 mg 100 mL/hr over 30 Minutes Intravenous On call to O.R. 06/10/24 1041 06/10/24 1447   06/11/24 1300  ceFAZolin  (ANCEF ) IVPB 2g/100 mL premix        2 g 200 mL/hr over 30 Minutes Intravenous On call to O.R. 06/10/24 1447 06/12/24 1908       Assessment/Plan:   Expand All  Collapse All 11 Days Post-Op    Subjective/Chief Complaint: PT WANTS SOMETHING TO DRINK  NOT GETTING PAIN MEDS      Objective: Vital signs in last 24 hours: Temp:  [97.5 F (36.4 C)-98.4 F (36.9 C)] 97.6 F (36.4 C) (07/04 0800) Pulse Rate:  [48-68] 62 (07/04 0800) Resp:  [11-24] 20 (07/04 0800) BP: (92-125)/(39-93) 120/62 (07/04 0800) SpO2:  [92 %-100 %] 94 % (07/04 0800) Weight:  [887 kg] 112 kg (07/04 0541) Last BM Date : 06/25/24   Intake/Output from previous day: 07/03 0701 - 07/04 0700 In: 538.4 [I.V.:223.4; Blood:315] Out: 2095 [Urine:1850; Drains:85; Stool:160] Intake/Output this shift: No intake/output data recorded.   Abd: soft, appropriately tender, midline wound clean and honeycomb in place, JP with serosang output, colostomy with melena present.    Lab Results:  Recent Labs (last 2 labs)      Recent Labs    06/25/24 1541 06/26/24 0445  WBC 7.2 8.5  HGB 9.2* 8.5*  HCT 27.8* 26.0*  PLT 195 190      BMET Recent Labs (last 2 labs)      Recent Labs    06/25/24 0355 06/26/24 0445  NA 133* 136  K 3.8 3.6  CL 100 103  CO2 26 24  GLUCOSE 90 76  BUN 52* 40*  CREATININE 1.05* 1.18*  CALCIUM  8.2* 7.8*      PT/INR Recent Labs (last 2 labs)  No results for input(s): LABPROT, INR in the last 72 hours.   ABG  Recent Labs (last 2 labs)  No results for input(s): PHART, HCO3 in the last 72 hours.   Invalid input(s): PCO2, PO2     Studies/Results:  Imaging Results (Last 48 hours)  IR Transcath/Emboliz Result Date: 06/26/2024 INDICATION: Refractory upper GI bleed s/p endoscopy. EXAM: Title; COIL EMBOLIZATION OF GASTRODUODENAL ARTERY Listed procedures; 1.  ULTRASOUND GUIDANCE FOR ARTERIAL ACCESS 2. MESENTERIC ARTERIOGRAPHY, INCLUDING CELIAC, COMMON HEPATIC and GASTRODUODENAL ARTERIOGRAMS 3.  COIL EMBOLIZATION OF GASTRODUODENAL ARTERY COMPARISON:  CTA abdomen pelvis, 06/24/2024 MEDICATIONS: 4 mg Zofran  IV.  50 mg Benadryl  IV.  ANESTHESIA/SEDATION: Moderate (conscious) sedation was employed during this procedure. A total of Versed  1.5 mg and Fentanyl  75 mcg was administered intravenously. Moderate Sedation Time: 89 minutes. The patient's level of consciousness and vital signs were monitored continuously by radiology nursing throughout the procedure under my direct supervision. CONTRAST:  100 mL Omnipaque  300 FLUOROSCOPY: Radiation Exposure Index and estimated peak skin dose (PSD);  Reference air kerma (RAK), 448.5 mGy. Kerma-area product (KAP), 7499.3 uGy*m. COMPLICATIONS: None immediate. PROCEDURE: Informed consent was obtained from the patient and/or patient's representative following explanation of the procedure, risks, benefits and alternatives. All questions were addressed. A time out was performed prior to the initiation of the procedure. Maximal barrier sterile technique utilized including caps, mask, sterile gowns, sterile gloves, large sterile drape, hand hygiene, and chlorhexidine  prep. The RIGHT femoral head was marked fluoroscopically. Under sterile conditions and local anesthesia, the RIGHT common femoral artery access was performed with a micropuncture needle. Under direct ultrasound guidance, the RIGHT common femoral was accessed with a micropuncture kit. An ultrasound image was saved for documentation purposes. This allowed for placement of a 5 Fr 35 cm vascular sheath. A limited arteriogram was performed through the side arm of the sheath confirming appropriate access within the RIGHT common femoral artery. A limited abdominal aortogram was performed to help identify the celiac artery origin. Over a Bentson wire, a C2 catheter was advanced, back bled and flushed. The catheter was then utilized to select the celiac access, then advanced into the common hepatic then gastroduodenal arteries. Selective mesenteric arteriograms were performed at each level. Initial coil deployment was performed with the 5 Fr catheter within the  gastroduodenal artery, then a 6 mm 0.035 inch Terumo Azure was attempted to be deployed, however due deployment mechanism fraying was noted on reposition attempt. The 5 Fr sheath was exchanged for an 8 Fr 35 cm vascular sheath, then a 6 Fr snare was deployed. The frayed deployment mechanism was ensnared, and the mechanism and coil were removed intact. The C2 catheter was readvanced and utilized to select the celiac axis, and finally back into the GDA. Using a 2.4 Fr microcatheter and 0.016 inch Fathom microwire access into the gastroduodenal artery was performed and a selective arteriogram was performed. Selective embolization with multiple 0.018 inch micro coils was performed. The microcatheter was removed and arteriogram with the 5 Fr catheter at the common hepatic artery was performed. Adequate pruning of the GDA feeder arteries was achieved on post embolization arteriogram. Images were reviewed and the procedure was terminated. All wires, catheters and sheaths were removed from the patient. Hemostasis was achieved at the RIGHT groin access site with Angio-Seal closure device. The patient tolerated the procedure well without immediate post procedural complication. FINDINGS: *Celiac and common hepatic arteriograms with normal order and branching. *Gastroduodenal arteriogram without active extravasation noted. *Embolization with adequate pruning of the GDA distribution arteries. *Access via the RIGHT femoral artery. Angio-Seal closure at RIGHT groin. Palpable RLE pulses at the end of the Case IMPRESSION: 1. 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 2. Successful empiric coil embolization of the gastroduodenal artery for refractory upper GI bleed. PLAN: - The patient is to remain flat for 2 hours with RIGHT leg straight. - The patient may continue to experience residual melena however should resolve in the coming days. Thom Hall, MD [facility] Radiology Electronically Signed   By: Thom Hall M.D.   On: 06/26/2024 10:26    IR US  Guide Vasc Access Right Result Date: 06/26/2024 INDICATION: Refractory upper GI bleed s/p endoscopy. EXAM: Title; COIL EMBOLIZATION OF GASTRODUODENAL ARTERY Listed procedures; 1.  ULTRASOUND GUIDANCE FOR ARTERIAL ACCESS 2. MESENTERIC ARTERIOGRAPHY, INCLUDING CELIAC, COMMON HEPATIC and GASTRODUODENAL ARTERIOGRAMS 3.  COIL EMBOLIZATION OF GASTRODUODENAL ARTERY COMPARISON:  CTA abdomen pelvis, 06/24/2024 MEDICATIONS: 4 mg Zofran  IV.  50 mg Benadryl  IV. ANESTHESIA/SEDATION: Moderate (conscious)  sedation was employed during this procedure. A total of Versed  1.5 mg and Fentanyl  75 mcg was administered intravenously. Moderate Sedation Time: 89 minutes. The patient's level of consciousness and vital signs were monitored continuously by radiology nursing throughout the procedure under my direct supervision. CONTRAST:  100 mL Omnipaque  300 FLUOROSCOPY: Radiation Exposure Index and estimated peak skin dose (PSD); Reference air kerma (RAK), 448.5 mGy. Kerma-area product (KAP), 7499.3 uGy*m. COMPLICATIONS: None immediate. PROCEDURE: Informed consent was obtained from the patient and/or patient's representative following explanation of the procedure, risks, benefits and alternatives. All questions were addressed. A time out was performed prior to the initiation of the procedure. Maximal barrier sterile technique utilized including caps, mask, sterile gowns, sterile gloves, large sterile drape, hand hygiene, and chlorhexidine  prep. The RIGHT femoral head was marked fluoroscopically. Under sterile conditions and local anesthesia, the RIGHT common femoral artery access was performed with a micropuncture needle. Under direct ultrasound guidance, the RIGHT common femoral was accessed with a micropuncture kit. An ultrasound image was saved for documentation purposes. This allowed for placement of a 5 Fr 35 cm  vascular sheath. A limited arteriogram was performed through the side arm of the sheath confirming appropriate access within the RIGHT common femoral artery. A limited abdominal aortogram was performed to help identify the celiac artery origin. Over a Bentson wire, a C2 catheter was advanced, back bled and flushed. The catheter was then utilized to select the celiac access, then advanced into the common hepatic then gastroduodenal arteries. Selective mesenteric arteriograms were performed at each level. Initial coil deployment was performed with the 5 Fr catheter within the gastroduodenal artery, then a 6 mm 0.035 inch Terumo Azure was attempted to be deployed, however due deployment mechanism fraying was noted on reposition attempt. The 5 Fr sheath was exchanged for an 8 Fr 35 cm vascular sheath, then a 6 Fr snare was deployed. The frayed deployment mechanism was ensnared, and the mechanism and coil were removed intact. The C2 catheter was readvanced and utilized to select the celiac axis, and finally back into the GDA. Using a 2.4 Fr microcatheter and 0.016 inch Fathom microwire access into the gastroduodenal artery was performed and a selective arteriogram was performed. Selective embolization with multiple 0.018 inch micro coils was performed. The microcatheter was removed and arteriogram with the 5 Fr catheter at the common hepatic artery was performed. Adequate pruning of the GDA feeder arteries was achieved on post embolization arteriogram. Images were reviewed and the procedure was terminated. All wires, catheters and sheaths were removed from the patient. Hemostasis was achieved at the RIGHT groin access site with Angio-Seal closure device. The patient tolerated the procedure well without immediate post procedural complication. FINDINGS: *Celiac and common hepatic arteriograms with normal order and branching. *Gastroduodenal arteriogram without active extravasation noted. *Embolization with adequate pruning  of the GDA distribution arteries. *Access via the RIGHT femoral artery. Angio-Seal closure at RIGHT groin. Palpable RLE pulses at the end of the Case IMPRESSION: 1. 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 2. Successful empiric coil embolization of the gastroduodenal artery for refractory upper GI bleed. PLAN: - The patient is to remain flat for 2 hours with RIGHT leg straight. - The patient may continue to experience residual melena however should resolve in the coming days. Thom Hall, MD [facility] Radiology Electronically Signed   By: Thom Hall M.D.   On: 06/26/2024 10:26    IR Angiogram Visceral Selective Result Date: 06/26/2024  INDICATION: Refractory upper GI bleed s/p endoscopy. EXAM: Title; COIL EMBOLIZATION OF GASTRODUODENAL ARTERY Listed procedures; 1.  ULTRASOUND GUIDANCE FOR ARTERIAL ACCESS 2. MESENTERIC ARTERIOGRAPHY, INCLUDING CELIAC, COMMON HEPATIC and GASTRODUODENAL ARTERIOGRAMS 3.  COIL EMBOLIZATION OF GASTRODUODENAL ARTERY COMPARISON:  CTA abdomen pelvis, 06/24/2024 MEDICATIONS: 4 mg Zofran  IV.  50 mg Benadryl  IV. ANESTHESIA/SEDATION: Moderate (conscious) sedation was employed during this procedure. A total of Versed  1.5 mg and Fentanyl  75 mcg was administered intravenously. Moderate Sedation Time: 89 minutes. The patient's level of consciousness and vital signs were monitored continuously by radiology nursing throughout the procedure under my direct supervision. CONTRAST:  100 mL Omnipaque  300 FLUOROSCOPY: Radiation Exposure Index and estimated peak skin dose (PSD); Reference air kerma (RAK), 448.5 mGy. Kerma-area product (KAP), 7499.3 uGy*m. COMPLICATIONS: None immediate. PROCEDURE: Informed consent was obtained from the patient and/or patient's representative following explanation of the procedure, risks, benefits and alternatives. All questions were addressed. A time out was performed  prior to the initiation of the procedure. Maximal barrier sterile technique utilized including caps, mask, sterile gowns, sterile gloves, large sterile drape, hand hygiene, and chlorhexidine  prep. The RIGHT femoral head was marked fluoroscopically. Under sterile conditions and local anesthesia, the RIGHT common femoral artery access was performed with a micropuncture needle. Under direct ultrasound guidance, the RIGHT common femoral was accessed with a micropuncture kit. An ultrasound image was saved for documentation purposes. This allowed for placement of a 5 Fr 35 cm vascular sheath. A limited arteriogram was performed through the side arm of the sheath confirming appropriate access within the RIGHT common femoral artery. A limited abdominal aortogram was performed to help identify the celiac artery origin. Over a Bentson wire, a C2 catheter was advanced, back bled and flushed. The catheter was then utilized to select the celiac access, then advanced into the common hepatic then gastroduodenal arteries. Selective mesenteric arteriograms were performed at each level. Initial coil deployment was performed with the 5 Fr catheter within the gastroduodenal artery, then a 6 mm 0.035 inch Terumo Azure was attempted to be deployed, however due deployment mechanism fraying was noted on reposition attempt. The 5 Fr sheath was exchanged for an 8 Fr 35 cm vascular sheath, then a 6 Fr snare was deployed. The frayed deployment mechanism was ensnared, and the mechanism and coil were removed intact. The C2 catheter was readvanced and utilized to select the celiac axis, and finally back into the GDA. Using a 2.4 Fr microcatheter and 0.016 inch Fathom microwire access into the gastroduodenal artery was performed and a selective arteriogram was performed. Selective embolization with multiple 0.018 inch micro coils was performed. The microcatheter was removed and arteriogram with the 5 Fr catheter at the common hepatic artery was  performed. Adequate pruning of the GDA feeder arteries was achieved on post embolization arteriogram. Images were reviewed and the procedure was terminated. All wires, catheters and sheaths were removed from the patient. Hemostasis was achieved at the RIGHT groin access site with Angio-Seal closure device. The patient tolerated the procedure well without immediate post procedural complication. FINDINGS: *Celiac and common hepatic arteriograms with normal order and branching. *Gastroduodenal arteriogram without active extravasation noted. *Embolization with adequate pruning of the GDA distribution arteries. *Access via the RIGHT femoral artery. Angio-Seal closure at RIGHT groin. Palpable RLE pulses at the end of the Case IMPRESSION: 1. 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 2. Successful empiric coil embolization of the gastroduodenal artery for refractory upper GI bleed. PLAN: - The patient is to  remain flat for 2 hours with RIGHT leg straight. - The patient may continue to experience residual melena however should resolve in the coming days. Thom Hall, MD [facility] Radiology Electronically Signed   By: Thom Hall M.D.   On: 06/26/2024 10:26    IR Angiogram Visceral Selective Result Date: 06/26/2024 INDICATION: Refractory upper GI bleed s/p endoscopy. EXAM: Title; COIL EMBOLIZATION OF GASTRODUODENAL ARTERY Listed procedures; 1.  ULTRASOUND GUIDANCE FOR ARTERIAL ACCESS 2. MESENTERIC ARTERIOGRAPHY, INCLUDING CELIAC, COMMON HEPATIC and GASTRODUODENAL ARTERIOGRAMS 3.  COIL EMBOLIZATION OF GASTRODUODENAL ARTERY COMPARISON:  CTA abdomen pelvis, 06/24/2024 MEDICATIONS: 4 mg Zofran  IV.  50 mg Benadryl  IV. ANESTHESIA/SEDATION: Moderate (conscious) sedation was employed during this procedure. A total of Versed  1.5 mg and Fentanyl  75 mcg was administered intravenously. Moderate Sedation Time: 89 minutes. The  patient's level of consciousness and vital signs were monitored continuously by radiology nursing throughout the procedure under my direct supervision. CONTRAST:  100 mL Omnipaque  300 FLUOROSCOPY: Radiation Exposure Index and estimated peak skin dose (PSD); Reference air kerma (RAK), 448.5 mGy. Kerma-area product (KAP), 7499.3 uGy*m. COMPLICATIONS: None immediate. PROCEDURE: Informed consent was obtained from the patient and/or patient's representative following explanation of the procedure, risks, benefits and alternatives. All questions were addressed. A time out was performed prior to the initiation of the procedure. Maximal barrier sterile technique utilized including caps, mask, sterile gowns, sterile gloves, large sterile drape, hand hygiene, and chlorhexidine  prep. The RIGHT femoral head was marked fluoroscopically. Under sterile conditions and local anesthesia, the RIGHT common femoral artery access was performed with a micropuncture needle. Under direct ultrasound guidance, the RIGHT common femoral was accessed with a micropuncture kit. An ultrasound image was saved for documentation purposes. This allowed for placement of a 5 Fr 35 cm vascular sheath. A limited arteriogram was performed through the side arm of the sheath confirming appropriate access within the RIGHT common femoral artery. A limited abdominal aortogram was performed to help identify the celiac artery origin. Over a Bentson wire, a C2 catheter was advanced, back bled and flushed. The catheter was then utilized to select the celiac access, then advanced into the common hepatic then gastroduodenal arteries. Selective mesenteric arteriograms were performed at each level. Initial coil deployment was performed with the 5 Fr catheter within the gastroduodenal artery, then a 6 mm 0.035 inch Terumo Azure was attempted to be deployed, however due deployment mechanism fraying was noted on reposition attempt. The 5 Fr sheath was exchanged for an 8 Fr  35 cm vascular sheath, then a 6 Fr snare was deployed. The frayed deployment mechanism was ensnared, and the mechanism and coil were removed intact. The C2 catheter was readvanced and utilized to select the celiac axis, and finally back into the GDA. Using a 2.4 Fr microcatheter and 0.016 inch Fathom microwire access into the gastroduodenal artery was performed and a selective arteriogram was performed. Selective embolization with multiple 0.018 inch micro coils was performed. The microcatheter was removed and arteriogram with the 5 Fr catheter at the common hepatic artery was performed. Adequate pruning of the GDA feeder arteries was achieved on post embolization arteriogram. Images were reviewed and the procedure was terminated. All wires, catheters and sheaths were removed from the patient. Hemostasis was achieved at the RIGHT groin access site with Angio-Seal closure device. The patient tolerated the procedure well without immediate post procedural complication. FINDINGS: *Celiac and common hepatic arteriograms with normal order and branching. *Gastroduodenal arteriogram without active extravasation noted. *Embolization with adequate  pruning of the GDA distribution arteries. *Access via the RIGHT femoral artery. Angio-Seal closure at RIGHT groin. Palpable RLE pulses at the end of the Case IMPRESSION: 1. 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 2. Successful empiric coil embolization of the gastroduodenal artery for refractory upper GI bleed. PLAN: - The patient is to remain flat for 2 hours with RIGHT leg straight. - The patient may continue to experience residual melena however should resolve in the coming days. Thom Hall, MD [facility] Radiology Electronically Signed   By: Thom Hall M.D.   On: 06/26/2024 10:26    IR Angiogram Visceral Selective Result Date: 06/26/2024 INDICATION: Refractory upper GI  bleed s/p endoscopy. EXAM: Title; COIL EMBOLIZATION OF GASTRODUODENAL ARTERY Listed procedures; 1.  ULTRASOUND GUIDANCE FOR ARTERIAL ACCESS 2. MESENTERIC ARTERIOGRAPHY, INCLUDING CELIAC, COMMON HEPATIC and GASTRODUODENAL ARTERIOGRAMS 3.  COIL EMBOLIZATION OF GASTRODUODENAL ARTERY COMPARISON:  CTA abdomen pelvis, 06/24/2024 MEDICATIONS: 4 mg Zofran  IV.  50 mg Benadryl  IV. ANESTHESIA/SEDATION: Moderate (conscious) sedation was employed during this procedure. A total of Versed  1.5 mg and Fentanyl  75 mcg was administered intravenously. Moderate Sedation Time: 89 minutes. The patient's level of consciousness and vital signs were monitored continuously by radiology nursing throughout the procedure under my direct supervision. CONTRAST:  100 mL Omnipaque  300 FLUOROSCOPY: Radiation Exposure Index and estimated peak skin dose (PSD); Reference air kerma (RAK), 448.5 mGy. Kerma-area product (KAP), 7499.3 uGy*m. COMPLICATIONS: None immediate. PROCEDURE: Informed consent was obtained from the patient and/or patient's representative following explanation of the procedure, risks, benefits and alternatives. All questions were addressed. A time out was performed prior to the initiation of the procedure. Maximal barrier sterile technique utilized including caps, mask, sterile gowns, sterile gloves, large sterile drape, hand hygiene, and chlorhexidine  prep. The RIGHT femoral head was marked fluoroscopically. Under sterile conditions and local anesthesia, the RIGHT common femoral artery access was performed with a micropuncture needle. Under direct ultrasound guidance, the RIGHT common femoral was accessed with a micropuncture kit. An ultrasound image was saved for documentation purposes. This allowed for placement of a 5 Fr 35 cm vascular sheath. A limited arteriogram was performed through the side arm of the sheath confirming appropriate access within the RIGHT common femoral artery. A limited abdominal aortogram was performed to  help identify the celiac artery origin. Over a Bentson wire, a C2 catheter was advanced, back bled and flushed. The catheter was then utilized to select the celiac access, then advanced into the common hepatic then gastroduodenal arteries. Selective mesenteric arteriograms were performed at each level. Initial coil deployment was performed with the 5 Fr catheter within the gastroduodenal artery, then a 6 mm 0.035 inch Terumo Azure was attempted to be deployed, however due deployment mechanism fraying was noted on reposition attempt. The 5 Fr sheath was exchanged for an 8 Fr 35 cm vascular sheath, then a 6 Fr snare was deployed. The frayed deployment mechanism was ensnared, and the mechanism and coil were removed intact. The C2 catheter was readvanced and utilized to select the celiac axis, and finally back into the GDA. Using a 2.4 Fr microcatheter and 0.016 inch Fathom microwire access into the gastroduodenal artery was performed and a selective arteriogram was performed. Selective embolization with multiple 0.018 inch micro coils was performed. The microcatheter was removed and arteriogram with the 5 Fr catheter at the common hepatic artery was performed. Adequate pruning of the GDA feeder arteries was achieved on post embolization arteriogram. Images were reviewed  and the procedure was terminated. All wires, catheters and sheaths were removed from the patient. Hemostasis was achieved at the RIGHT groin access site with Angio-Seal closure device. The patient tolerated the procedure well without immediate post procedural complication. FINDINGS: *Celiac and common hepatic arteriograms with normal order and branching. *Gastroduodenal arteriogram without active extravasation noted. *Embolization with adequate pruning of the GDA distribution arteries. *Access via the RIGHT femoral artery. Angio-Seal closure at RIGHT groin. Palpable RLE pulses at the end of the Case IMPRESSION: 1. Mesenteric arteriography without  angiographic evidence of active extravasation at the duodenum. 2. Successful empiric coil embolization of the gastroduodenal artery for refractory upper GI bleed. PLAN: - The patient is to remain flat for 2 hours with RIGHT leg straight. - The patient may continue to experience residual melena however should resolve in the coming days. Thom Hall, MD [facility] Electronically Signed   By: Thom Hall M.D.   On: 06/26/2024 10:26    CT ANGIO GI BLEED Result Date: 06/24/2024 CLINICAL DATA:  Melena with acute blood-loss and anemia. Status post exploratory laparotomy and Arlyss patch placement for duodenitis and duodenal ulcer with perforation on 06/15/2024. Clinical concern for determining if there is active gastrointestinal bleeding. EXAM: CTA ABDOMEN AND PELVIS WITHOUT AND WITH CONTRAST TECHNIQUE: Multidetector CT imaging of the abdomen and pelvis was performed using the standard protocol during bolus administration of intravenous contrast. Multiplanar reconstructed images and MIPs were obtained and reviewed to evaluate the vascular anatomy. RADIATION DOSE REDUCTION: This exam was performed according to the departmental dose-optimization program which includes automated exposure control, adjustment of the mA and/or kV according to patient size and/or use of iterative reconstruction technique. CONTRAST:  OMNIPAQUE  IOHEXOL  350 MG/ML SOLN COMPARISON:  Abdomen and pelvis CT dated 06/15/2024. FINDINGS: VASCULAR Aorta: Atheromatous changes without stenosis, aneurysm or dissection. Celiac: Patent without evidence of aneurysm, dissection, vasculitis or significant stenosis. SMA: Patent without evidence of aneurysm, dissection, vasculitis or significant stenosis. Renals: 2 right renal arteries and 1 left renal artery. There is some plaque formation in the proximal, upper right renal artery producing approximately 50% stenosis. No plaque formation or stenosis  involving the more inferior right renal artery. Calcified plaque at the origin of the left renal artery with a proximally 70% stenosis. IMA: Calcified plaque throughout the inferior aspect of the abdominal aorta without opacification of the inferior mesenteric artery. Inflow: Bilateral calcified plaque formation without aneurysm or significant stenosis. Proximal Outflow: Bilateral calcified plaque formation without aneurysm or significant stenosis. Veins: No obvious venous abnormality within the limitations of this arterial phase study. Review of the MIP images confirms the above findings. NON-VASCULAR Lower chest: Moderate-sized right pleural effusion and small to moderate-sized left pleural effusion. Associated bilateral lower lobe compressive atelectasis. The included portion of the heart is mildly enlarged with no pericardial effusion seen. Hepatobiliary: No focal liver abnormality is seen. Status post cholecystectomy. No biliary dilatation. Pancreas: Unremarkable. No pancreatic ductal dilatation or surrounding inflammatory changes. Spleen: Normal in size without focal abnormality. Adrenals/Urinary Tract: Foley catheter in the urinary bladder. Otherwise, unremarkable bladder and adrenal glands. Simple appearing exophytic cyst arising from the left kidney, not needing imaging follow-up. 7 mm calculus in the upper left renal pelvis. Interval minimal dilatation of the left renal collecting system to the level of the ureteropelvic junction with no obstructing stone or mass seen. No ureteral dilatation. Stomach/Bowel: Interval diffuse low-density gastric wall thickening with mucosal enhancement, most pronounced involving the gastric antrum. Increased high density  material in the 2nd portion of the duodenum on the initial postcontrast images, unchanged on the postcontrast images. No active contrast extravasation seen in the stomach or bowel. Status post partial colectomy including the proximal sigmoid colon. Enteric  contrast in the excluded distal sigmoid colon and rectum, unchanged on the precontrast and postcontrast images. Multiple sigmoid colon diverticula in that region without evidence of diverticulitis. Unremarkable small bowel. Surgically absent appendix. 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 Inferior to the ostomy, large ventral hernia containing herniated stomach, colon and small bowel is again demonstrated. No evidence of obstruction. Lymphatic: No enlarged lymph nodes. Reproductive: Status post hysterectomy. No adnexal masses. Other: Stable large ventral hernia containing herniated stomach, small bowel and colon without obstruction. An upper abdominal surgical drain is in place with no adjacent fluid. Musculoskeletal: Extensive lumbar and thoracic spine degenerative changes. IMPRESSION: 1. No active contrast extravasation seen in the stomach or bowel. 2. Interval diffuse low-density gastric wall thickening with mucosal enhancement, most pronounced involving the gastric antrum. This is consistent with gastritis. 3. Moderate-sized right pleural effusion and small to moderate-sized left pleural effusion with associated bilateral lower lobe compressive atelectasis. 4. Mild cardiomegaly. 5. 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 6. 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 7. Interval minimal dilatation of the left renal collecting system to the level of the ureteropelvic junction with no obstructing stone or mass seen. This could be due to a recently passed stone or a UPJ obstruction. 8. Stable large ventral hernia containing herniated stomach, small bowel and colon without obstruction. 9. Status post partial colectomy including the proximal sigmoid colon. Enteric contrast in the excluded distal sigmoid colon and rectum, unchanged on the precontrast and postcontrast images. Multiple sigmoid colon diverticula in that  region without evidence of diverticulitis. 10. 7 mm nonobstructing calculus in the upper left renal pelvis. 11. Aortic atherosclerosis. Aortic Atherosclerosis (ICD10-I70.0). Electronically Signed   By: Elspeth Bathe M.D.   On: 06/24/2024 15:00       Anti-infectives: Anti-infectives (From admission, onward)        Start     Dose/Rate Route Frequency Ordered Stop    06/16/24 1800   vancomycin  (VANCOCIN ) IVPB 1000 mg/200 mL premix  Status:  Discontinued        1,000 mg 200 mL/hr over 60 Minutes Intravenous Every 24 hours 06/15/24 1317 06/15/24 1328    06/15/24 1700   vancomycin  (VANCOREADY) IVPB 2000 mg/400 mL  Status:  Discontinued        2,000 mg 200 mL/hr over 120 Minutes Intravenous  Once 06/15/24 1317 06/15/24 1328    06/15/24 1600   fluconazole  (DIFLUCAN ) IVPB 400 mg  Status:  Discontinued        400 mg 100 mL/hr over 120 Minutes Intravenous Every 24 hours 06/15/24 1317 06/16/24 0914    06/15/24 1500   metroNIDAZOLE  (FLAGYL ) IVPB 500 mg  Status:  Discontinued        500 mg 100 mL/hr over 60 Minutes Intravenous Every 12 hours 06/15/24 1216 06/15/24 1255    06/15/24 1400   ciprofloxacin  (CIPRO ) IVPB 400 mg  Status:  Discontinued        400 mg 200 mL/hr over 60 Minutes Intravenous Every 12 hours 06/15/24 1216 06/15/24 1255    06/15/24 1400   piperacillin -tazobactam (ZOSYN ) IVPB 3.375 g        3.375 g 12.5 mL/hr over 240 Minutes Intravenous Every 8  hours 06/15/24 1317 06/20/24 2359    06/15/24 1400   vancomycin  (VANCOCIN ) IVPB 1000 mg/200 mL premix  Status:  Discontinued        1,000 mg 200 mL/hr over 60 Minutes Intravenous Every 24 hours 06/15/24 1328 06/16/24 0914    06/15/24 1333   vancomycin  (VANCOCIN ) 1-5 GM/200ML-% IVPB       Note to Pharmacy: Romona Shaver E: cabinet override         06/15/24 1333 06/15/24 1410    06/15/24 1221   ciprofloxacin  (CIPRO ) 400 MG/200ML IVPB  Status:  Discontinued       Note to Pharmacy: Dannielle Railing S: cabinet override         06/15/24 1221  06/15/24 1343    06/15/24 1221   metroNIDAZOLE  (FLAGYL ) 500 MG/100ML IVPB  Status:  Discontinued       Note to Pharmacy: Dannielle Railing S: cabinet override         06/15/24 1221 06/15/24 1343    06/12/24 1223   clindamycin  (CLEOCIN ) 900 MG/50ML IVPB       Note to Pharmacy: Elaine Nest L: cabinet override         06/12/24 1223 06/12/24 1332    06/12/24 1131   ceFAZolin  (ANCEF ) 2-4 GM/100ML-% IVPB  Status:  Discontinued       Note to Pharmacy: Billy Sluder H: cabinet override         06/12/24 1131 06/12/24 1434    06/11/24 1300   clindamycin  (CLEOCIN ) IVPB 900 mg  Status:  Discontinued        900 mg 100 mL/hr over 30 Minutes Intravenous On call to O.R. 06/10/24 1041 06/10/24 1447    06/11/24 1300   ceFAZolin  (ANCEF ) IVPB 2g/100 mL premix        2 g 200 mL/hr over 30 Minutes Intravenous On call to O.R. 06/10/24 1447 06/12/24 1908             Assessment/Plan: POD 13 , s/p ex lap with graham patch repair and JP drain placement for Perforated duodenal ulcer, possible bleeding ulcer as well, Dr. 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  -h. Pylori negative -will actually leave her surgical drain for right now. If she requires embo, we will be able to monitor this to assure no leak from necrosis.  On soft diet -multi-modal pain control -PT, OOB as able pending femur fx recs    FEN -clears advance to soft VTE - on hold due to anemia ID - [email or web address] , completed     LOS: 21 days    Eulah Hammonds, PA-C 06/28/2024

## 2024-06-28 NOTE — Progress Notes (Signed)
 RN unable to complete dressing change. Specialty bed malfunctioning. RN has attempted multiple times. Request has been put in for replacement bed. Per portable equipment, there will be no beds available until dayshift.

## 2024-06-29 ENCOUNTER — Inpatient Hospital Stay (HOSPITAL_COMMUNITY)

## 2024-06-29 ENCOUNTER — Encounter (HOSPITAL_COMMUNITY): Payer: Self-pay | Admitting: Radiology

## 2024-06-29 DIAGNOSIS — G4733 Obstructive sleep apnea (adult) (pediatric): Secondary | ICD-10-CM | POA: Diagnosis not present

## 2024-06-29 DIAGNOSIS — N179 Acute kidney failure, unspecified: Secondary | ICD-10-CM | POA: Diagnosis not present

## 2024-06-29 DIAGNOSIS — J449 Chronic obstructive pulmonary disease, unspecified: Secondary | ICD-10-CM | POA: Diagnosis not present

## 2024-06-29 DIAGNOSIS — K276 Chronic or unspecified peptic ulcer, site unspecified, with both hemorrhage and perforation: Secondary | ICD-10-CM | POA: Diagnosis not present

## 2024-06-29 LAB — BASIC METABOLIC PANEL WITH GFR
Anion gap: 6 (ref 5–15)
BUN: 18 mg/dL (ref 8–23)
CO2: 27 mmol/L (ref 22–32)
Calcium: 7.6 mg/dL — ABNORMAL LOW (ref 8.9–10.3)
Chloride: 100 mmol/L (ref 98–111)
Creatinine, Ser: 0.87 mg/dL (ref 0.44–1.00)
GFR, Estimated: >60 mL/min (ref >60)
Glucose, Bld: 82 mg/dL (ref 70–99)
Potassium: 3.8 mmol/L (ref 3.5–5.1)
Sodium: 133 mmol/L — ABNORMAL LOW (ref 135–145)

## 2024-06-29 LAB — CBC
HCT: 22.4 % — ABNORMAL LOW (ref 36.0–46.0)
HCT: 20.2 % — ABNORMAL LOW (ref 36.0–46.0)
Hemoglobin: 7.3 g/dL — ABNORMAL LOW (ref 12.0–15.0)
Hemoglobin: 6.7 g/dL — CL (ref 12.0–15.0)
MCH: 30.9 pg (ref 26.0–34.0)
MCH: 31.5 pg (ref 26.0–34.0)
MCHC: 32.6 g/dL (ref 30.0–36.0)
MCHC: 33.2 g/dL (ref 30.0–36.0)
MCV: 94.9 fL (ref 80.0–100.0)
MCV: 94.8 fL (ref 80.0–100.0)
Platelets: 197 K/uL (ref 150–400)
Platelets: 214 K/uL (ref 150–400)
RBC: 2.36 MIL/uL — ABNORMAL LOW (ref 3.87–5.11)
RBC: 2.13 MIL/uL — ABNORMAL LOW (ref 3.87–5.11)
RDW: 19.4 % — ABNORMAL HIGH (ref 11.5–15.5)
RDW: 19 % — ABNORMAL HIGH (ref 11.5–15.5)
WBC: 7.5 K/uL (ref 4.0–10.5)
WBC: 9.7 K/uL (ref 4.0–10.5)
nRBC: 0 % (ref 0.0–0.2)
nRBC: 0 % (ref 0.0–0.2)

## 2024-06-29 LAB — PROTIME-INR
INR: 1.4 — ABNORMAL HIGH (ref 0.8–1.2)
Prothrombin Time: 17.8 s — ABNORMAL HIGH (ref 11.4–15.2)

## 2024-06-29 LAB — PREPARE RBC (CROSSMATCH)

## 2024-06-29 MED ORDER — DIPHENHYDRAMINE HCL 25 MG PO CAPS
25.0000 mg | ORAL_CAPSULE | Freq: Once | ORAL | Status: AC
  Administered 2024-06-29: 25 mg via ORAL
  Filled 2024-06-29: qty 1

## 2024-06-29 MED ORDER — ACETAMINOPHEN 325 MG PO TABS
650.0000 mg | ORAL_TABLET | Freq: Once | ORAL | Status: AC
  Administered 2024-06-29: 650 mg via ORAL
  Filled 2024-06-29: qty 2

## 2024-06-29 MED ORDER — SODIUM CHLORIDE 0.9% IV SOLUTION
Freq: Once | INTRAVENOUS | Status: AC
Start: 2024-06-29 — End: 2024-06-30

## 2024-06-29 MED ORDER — IOHEXOL 350 MG/ML SOLN
100.0000 mL | Freq: Once | INTRAVENOUS | Status: AC | PRN
  Administered 2024-06-29: 100 mL via INTRAVENOUS

## 2024-06-29 NOTE — Progress Notes (Addendum)
 PROGRESS NOTE        PATIENT DETAILS Name: Christina Castaneda Age: 68 y.o. Sex: female Date of Birth: 05-Sep-1956 Admit Date: 06/07/2024 Admitting Physician Orlin Fairly, MD ERE:Floopd, Mariano SQUIBB, DO  Brief Summary: Patient is a 68 y.o.  female with history of perforated sigmoid colon-s/p Hartman's/colostomy 2017-who initially presented to [facility] following a mechanical fall-she was found to have left distal femur fracture-underwent retrograde femoral nail on 6/20, unfortunately-further hospital course complicated by perforated duodenal ulcer with acute blood loss anemia resulting in hypovolemic shock-she was evaluated by general surgery and underwent emergent exploratory laparotomy with Graham's patch-postoperatively-she was transferred to [facility] ICU.  She was stabilized and then subsequently transferred to TRH service.  Unfortunately-further hospital course complicated by recurrent melena through ostomy and acute blood loss anemia-after discussion with CCS and then with interventional radiology-patient underwent on GDA embolization on 7/4.  See below for further details.   Significant events: 6/15>> mechanical fall-left femur fracture/AKI-admit to TRH at [facility]. 6/20>> retrograde femoral nail by orthopedics at [facility] 6/23>> perforated duodenal ulcer-hypotension-exploratory laparotomy/placement of Arlyss supplies.  Remained intubated postprocedure-transferred to [facility] ICU. 6/24>> extubated 6/25>> off pressors-out of ICU-drop in hemoglobin but nonbloody ostomy output. 6/26>> transferred to TRH service 7/02>> Black tarry stools in ostomy-Hb down to 6.1-General Surgery reconsulted-too early for EGD-CT angio negative for bleed-IR deferred embolization 7/03 >> continues to have black tarry stools-IR performed GDA embolization. 7/06>> drop in Hb to 6.8-continues to have black stools in ostomy-1 unit of blood ordered.  Significant studies: 6/15>> CT left knee: Acute comminuted fracture of  distal left femoral shaft. 6/16>> renal ultrasound: No hydronephrosis 6/23>> CT angiogram abdomen: Perforated and bleeding peptic ulcer disease affecting proximal duodenum-appears to be hematoma in the gastric antrum/proximal duodenum. 6/26>> upper GI series with KUB: No evidence of contrast leak. 7/02>> CT angio GI bleed study: No active bleeding.  Significant microbiology data: 6/23>> blood culture: No growth 6/23>> peritoneal fluid: Abundant Candida albicans/moderate Candida glabrata.  Procedures: 6/20>> retrograde femoral nail by orthopedics at [facility] 6/23>> exploratory laparotomy with omental Graham's patch repair 7/03>> GDA embolization.  Consults: GI at [facility] at [facility] and [facility] IR  Subjective: Black stools ostomy continues.  No other issues.  Objective: Vitals: Blood pressure (!) 108/59, pulse 66, temperature 98.1 F (36.7 C), temperature source Oral, resp. rate 14, height 5' 5 (1.651 m), weight 113.5 kg, last menstrual period 11/18/2012, SpO2 98%.   Exam: Awake/alert Chest: Clear to auscultation Abdomen: Soft-nontender-Black stool in ostomy Extremity: Trace edema Neurologic: Nonfocal but with generalized weakness.  Pertinent Labs/Radiology:    Latest Ref Rng & Units 06/29/2024    4:12 AM 06/28/2024    4:48 PM 06/28/2024    3:45 AM  CBC  WBC 4.0 - 10.5 K/uL 7.5  8.9  8.5   Hemoglobin 12.0 - 15.0 g/dL 7.3  7.9  6.8   Hematocrit 36.0 - 46.0 % 22.4  23.1  21.1   Platelets 150 - 400 K/uL 197  201  186     Lab Results  Component Value Date   NA 133 (L) 06/29/2024   K 3.8 06/29/2024   CL 100 06/29/2024   CO2 27 06/29/2024     Assessment/Plan: Hemorrhagic and septic shock secondary to perforated bleeding duodenal ulcer with acute blood loss anemia-s/p exploratory laparotomy with Arlyss patch repair on 6/23 at [facility] Recurrent upper  GI bleeding with acute blood loss anemia noted on 7/2-CTA negative for extravasation-s/p GDA embolization by IR on 7/4. Had  stabilized after laparotomy with Arlyss patch repair-however unfortunately developedupper GI bleed-with melanotic stool on 7/2-CTA negative for extravasation.  After discussion with general surgery-given that she is still postop-EGD not safe to perform.  IR subsequently was consulted and patient underwent embolization of the gastroduodenal artery.  Unfortunately even with embolization-she still continues to have black starry stools-Hb slowly trending down-last transfused on 7/6 (has required a total of 9 units since admission).  Will need to be discussed with general surgery regarding potential timing of EGD/GI involvement. Follow CBC-remains on IV PPI.  Mechanical fall with left femur fracture-s/p retrograde left femoral nail on 6/20 Per orthopedics-WBAT LLE, knee immobilizer when ambulating, remove staples in 2 weeks.  Follow-up with Dr. Oneil Salvage at [facility] 2 weeks from discharge.  AKI Likely hemodynamically mediated Resolved  Acute urinary retention Foley catheter remains in place-will perform voiding trial in the next several days-if no further surgical procedures are planned.  HTN BP soft-continue to hold all antihypertensives  HLD Continue to hold Crestor -resume when able-currently n.p.o.  COPD Stable-no wheezing Continue bronchodilators  OSA CPAP nightly  Hypothyroidism Synthroid   History of perforated sigmoid colon-s/p Hartman/colostomy 2017  Debility/deconditioning PT/OT eval-SNF recommended.  Nutrition Status: Nutrition Problem: Increased nutrient needs Etiology: acute illness, wound healing Signs/Symptoms: estimated needs Interventions: Refer to RD note for recommendations   Pressure Ulcer: Agree with assessment and plan as outlined below. Pressure Injury 05/27/24 Sacrum Stage 2 -  Partial thickness loss of dermis presenting as a shallow open injury with a red, pink wound bed without slough. 4 small stage II present, each measures 0.4x 0.4 x 0.2  (Active)  05/27/24 1815  Location: Sacrum  Location Orientation:   Staging: Stage 2 -  Partial thickness loss of dermis presenting as a shallow open injury with a red, pink wound bed without slough.  Wound Description (Comments): 4 small stage II present, each measures 0.4x 0.4 x 0.2  DO NOT USE:  Present on Admission:   Dressing Type Foam - Lift dressing to assess site every shift 06/28/24 0800    Morbid Obesity: Estimated body mass index is 41.64 kg/m as calculated from the following:   Height as of this encounter: 5' 5 (1.651 m).   Weight as of this encounter: 113.5 kg.   Code status:   Code Status: Full Code   DVT Prophylaxis: Place and maintain sequential compression device Start: 06/18/24 1325 SCDs Start: 06/15/24 1937 Place and maintain sequential compression device Start: 06/15/24 0155 SQ heparin  discontinued 7/2-due to high GI bleeding with ABLA.  Family Communication: Spouse [phone]-updated 7/3   Disposition Plan: Status is: Inpatient Remains inpatient appropriate because: Severity of illness   Planned Discharge Destination:Skilled nursing facility   Diet: Diet Order             DIET SOFT Fluid consistency: Thin  Diet effective now                     Antimicrobial agents: Anti-infectives (From admission, onward)    Start     Dose/Rate Route Frequency Ordered Stop   06/16/24 1800  vancomycin  (VANCOCIN ) IVPB 1000 mg/200 mL premix  Status:  Discontinued        1,000 mg 200 mL/hr over 60 Minutes Intravenous Every 24 hours 06/15/24 1317 06/15/24 1328   06/15/24 1700  vancomycin  (VANCOREADY) IVPB 2000 mg/400 mL  Status:  Discontinued  2,000 mg 200 mL/hr over 120 Minutes Intravenous  Once 06/15/24 1317 06/15/24 1328   06/15/24 1600  fluconazole  (DIFLUCAN ) IVPB 400 mg  Status:  Discontinued        400 mg 100 mL/hr over 120 Minutes Intravenous Every 24 hours 06/15/24 1317 06/16/24 0914   06/15/24 1500  metroNIDAZOLE  (FLAGYL ) IVPB 500  mg  Status:  Discontinued        500 mg 100 mL/hr over 60 Minutes Intravenous Every 12 hours 06/15/24 1216 06/15/24 1255   06/15/24 1400  ciprofloxacin  (CIPRO ) IVPB 400 mg  Status:  Discontinued        400 mg 200 mL/hr over 60 Minutes Intravenous Every 12 hours 06/15/24 1216 06/15/24 1255   06/15/24 1400  piperacillin -tazobactam (ZOSYN ) IVPB 3.375 g        3.375 g 12.5 mL/hr over 240 Minutes Intravenous Every 8 hours 06/15/24 1317 06/20/24 2359   06/15/24 1400  vancomycin  (VANCOCIN ) IVPB 1000 mg/200 mL premix  Status:  Discontinued        1,000 mg 200 mL/hr over 60 Minutes Intravenous Every 24 hours 06/15/24 1328 06/16/24 0914   06/15/24 1333  vancomycin  (VANCOCIN ) 1-5 GM/200ML-% IVPB       Note to Pharmacy: Romona Shaver E: cabinet override      06/15/24 1333 06/15/24 1410   06/15/24 1221  ciprofloxacin  (CIPRO ) 400 MG/200ML IVPB  Status:  Discontinued       Note to Pharmacy: Dannielle Railing S: cabinet override      06/15/24 1221 06/15/24 1343   06/15/24 1221  metroNIDAZOLE  (FLAGYL ) 500 MG/100ML IVPB  Status:  Discontinued       Note to Pharmacy: Dannielle Railing S: cabinet override      06/15/24 1221 06/15/24 1343   06/12/24 1223  clindamycin  (CLEOCIN ) 900 MG/50ML IVPB       Note to Pharmacy: Elaine Nest L: cabinet override      06/12/24 1223 06/12/24 1332   06/12/24 1131  ceFAZolin  (ANCEF ) 2-4 GM/100ML-% IVPB  Status:  Discontinued       Note to Pharmacy: Billy Sluder H: cabinet override      06/12/24 1131 06/12/24 1434   06/11/24 1300  clindamycin  (CLEOCIN ) IVPB 900 mg  Status:  Discontinued        900 mg 100 mL/hr over 30 Minutes Intravenous On call to O.R. 06/10/24 1041 06/10/24 1447   06/11/24 1300  ceFAZolin  (ANCEF ) IVPB 2g/100 mL premix        2 g 200 mL/hr over 30 Minutes Intravenous On call to O.R. 06/10/24 1447 06/12/24 1908        MEDICATIONS: Scheduled Meds:  arformoterol   15 mcg Nebulization Q12H   budesonide  (PULMICORT ) nebulizer solution  0.5 mg  Nebulization BID   Chlorhexidine  Gluconate Cloth  6 each Topical Daily   feeding supplement  237 mL Oral BID BM   levothyroxine   150 mcg Oral Q0600   multivitamin with minerals  1 tablet Oral Daily   nutrition supplement (JUVEN)  1 packet Oral BID AC & HS   nystatin    Topical BID   pantoprazole  (PROTONIX ) IV  40 mg Intravenous Q12H   sodium chloride  flush  10-40 mL Intracatheter Q12H   sodium chloride  flush  3 mL Intravenous Q12H   sodium hypochlorite   Irrigation BID   Continuous Infusions: PRN Meds:.dextrose , iohexol , ipratropium-albuterol , magic mouthwash w/lidocaine , morphine  injection, naLOXone  (NARCAN )  injection, [DISCONTINUED] ondansetron  **OR** ondansetron  (ZOFRAN ) IV, mouth rinse, oxyCODONE , polyethylene glycol, prochlorperazine , sodium chloride  flush, sodium chloride   flush   I have personally reviewed following labs and imaging studies  LABORATORY DATA: CBC: Recent Labs  Lab 06/27/24 0428 06/27/24 0500 06/28/24 0345 06/28/24 1648 06/29/24 0412  WBC 10.5 11.3* 8.5 8.9 7.5  HGB 8.3* 8.1* 6.8* 7.9* 7.3*  HCT 24.8* 25.3* 21.1* 23.1* 22.4*  MCV 96.5 97.7 98.1 92.4 94.9  PLT 193 231 186 201 197    Basic Metabolic Panel: Recent Labs  Lab 06/24/24 1858 06/25/24 0355 06/26/24 0445 06/27/24 0428 06/29/24 0412  NA 133* 133* 136 134* 133*  K 4.1 3.8 3.6 3.6 3.8  CL 100 100 103 102 100  CO2 25 26 24 24 27   GLUCOSE 97 90 76 112* 82  BUN 49* 52* 40* 36* 18  CREATININE 1.10* 1.05* 1.18* 1.04* 0.87  CALCIUM  7.8* 8.2* 7.8* 8.1* 7.6*    GFR: Estimated Creatinine Clearance: 75.6 mL/min (by C-G formula based on SCr of 0.87 mg/dL).  Liver Function Tests: No results for input(s): AST, ALT, ALKPHOS, BILITOT, PROT, ALBUMIN in the last 168 hours.  No results for input(s): LIPASE, AMYLASE in the last 168 hours. No results for input(s): AMMONIA in the last 168 hours.  Coagulation Profile: No results for input(s): INR, PROTIME in the last 168  hours.  Cardiac Enzymes: No results for input(s): CKTOTAL, CKMB, CKMBINDEX, TROPONINI in the last 168 hours.  BNP (last 3 results) No results for input(s): PROBNP in the last 8760 hours.  Lipid Profile: No results for input(s): CHOL, HDL, LDLCALC, TRIG, CHOLHDL, LDLDIRECT in the last 72 hours.   Thyroid  Function Tests: No results for input(s): TSH, T4TOTAL, FREET4, T3FREE, THYROIDAB in the last 72 hours.  Anemia Panel: No results for input(s): VITAMINB12, FOLATE, FERRITIN, TIBC, IRON, RETICCTPCT in the last 72 hours.  Urine analysis:    Component Value Date/Time   COLORURINE YELLOW 06/07/2024 2026   APPEARANCEUR HAZY (A) 06/07/2024 2026   LABSPEC 1.015 06/07/2024 2026   PHURINE 5.0 06/07/2024 2026   GLUCOSEU NEGATIVE 06/07/2024 2026   HGBUR NEGATIVE 06/07/2024 2026   BILIRUBINUR NEGATIVE 06/07/2024 2026   KETONESUR NEGATIVE 06/07/2024 2026   PROTEINUR NEGATIVE 06/07/2024 2026   NITRITE NEGATIVE 06/07/2024 2026   LEUKOCYTESUR NEGATIVE 06/07/2024 2026    Sepsis Labs: Lactic Acid, Venous    Component Value Date/Time   LATICACIDVEN 1.0 05/27/2024 1945    MICROBIOLOGY: No results found for this or any previous visit (from the past 240 hours).   RADIOLOGY STUDIES/RESULTS: No results found.    LOS: 22 days   Donalda Applebaum, MD  [facility]    To contact the attending provider between 7A-7P or the covering provider during after hours 7P-7A, please log into the web site [email or web address] and access using universal [place] password for that web site. If you do not have the password, please call the hospital operator.  06/29/2024, 10:07 AM

## 2024-06-29 NOTE — Plan of Care (Signed)
  Problem: Education: Goal: Knowledge of General Education information will improve Description: Including pain rating scale, medication(s)/side effects and non-pharmacologic comfort measures Outcome: Progressing   Problem: Health Behavior/Discharge Planning: Goal: Ability to manage health-related needs will improve Outcome: Progressing   Problem: Clinical Measurements: Goal: Ability to maintain clinical measurements within normal limits will improve Outcome: Progressing Goal: Will remain free from infection Outcome: Progressing Goal: Diagnostic test results will improve Outcome: Progressing Goal: Respiratory complications will improve Outcome: Progressing Goal: Cardiovascular complication will be avoided Outcome: Progressing   Problem: Nutrition: Goal: Adequate nutrition will be maintained Outcome: Progressing   Problem: Elimination: Goal: Will not experience complications related to bowel motility Outcome: Progressing Goal: Will not experience complications related to urinary retention Outcome: Progressing   Problem: Pain Managment: Goal: General experience of comfort will improve and/or be controlled Outcome: Progressing   Problem: Safety: Goal: Ability to remain free from injury will improve Outcome: Progressing   Problem: Safety: Goal: Non-violent Restraint(s) Outcome: Progressing   Problem: Respiratory: Goal: Ability to maintain a clear airway and adequate ventilation will improve Outcome: Progressing   Problem: Role Relationship: Goal: Method of communication will improve Outcome: Progressing   Problem: Education: Goal: Understanding of CV disease, CV risk reduction, and recovery process will improve Outcome: Progressing Goal: Individualized Educational Video(s) Outcome: Progressing   Problem: Activity: Goal: Ability to return to baseline activity level will improve Outcome: Progressing   Problem: Cardiovascular: Goal: Ability to achieve and  maintain adequate cardiovascular perfusion will improve Outcome: Progressing Goal: Vascular access site(s) Level 0-1 will be maintained Outcome: Progressing   Problem: Health Behavior/Discharge Planning: Goal: Ability to safely manage health-related needs after discharge will improve Outcome: Progressing   Problem: Activity: Goal: Risk for activity intolerance will decrease Outcome: Not Progressing   Problem: Coping: Goal: Level of anxiety will decrease Outcome: Not Progressing   Problem: Skin Integrity: Goal: Risk for impaired skin integrity will decrease Outcome: Not Progressing

## 2024-06-29 NOTE — TOC Progression Note (Signed)
 Transition of Care [facility]) - Progression Note    Patient Details  Name: Christina Castaneda MRN: [id number] Date of Birth: 12-23-1956  [facility]) CM/SW Contact  Inocente GORMAN Kindle, LCSW Phone Number: 06/29/2024, 10:28 AM  Clinical Narrative:    CSW continuing to follow and update [place].    Expected Discharge Plan: Skilled Nursing Facility Barriers to Discharge: Continued Medical Work up, Other (must enter comment) (SNF auth pending)  Expected Discharge Plan and Services In-house Referral: Clinical Social Work Discharge Planning Services: CM Consult Post Acute Care Choice: Skilled Nursing Facility Living arrangements for the past 2 months: Single Family Home                                       Social Determinants of Health (SDOH) Interventions SDOH Screenings   Food Insecurity: No Food Insecurity (06/08/2024)  Housing: Low Risk  (06/08/2024)  Transportation Needs: No Transportation Needs (06/08/2024)  Utilities: Not At Risk (06/08/2024)  Social Connections: Unknown (06/08/2024)  Recent Concern: Social Connections - Moderately Isolated (05/27/2024)  Tobacco Use: High Risk (06/25/2024)    Readmission Risk Interventions    05/27/2024   10:01 PM  Readmission Risk Prevention Plan  Post Dischage Appt Complete  Medication Screening Complete  Transportation Screening Complete

## 2024-06-29 NOTE — Plan of Care (Signed)
  Problem: Education: Goal: Knowledge of General Education information will improve Description: Including pain rating scale, medication(s)/side effects and non-pharmacologic comfort measures Outcome: Progressing   Problem: Health Behavior/Discharge Planning: Goal: Ability to manage health-related needs will improve Outcome: Progressing   Problem: Clinical Measurements: Goal: Ability to maintain clinical measurements within normal limits will improve Outcome: Progressing Goal: Will remain free from infection Outcome: Progressing Goal: Diagnostic test results will improve Outcome: Progressing Goal: Respiratory complications will improve Outcome: Progressing Goal: Cardiovascular complication will be avoided Outcome: Progressing   Problem: Activity: Goal: Risk for activity intolerance will decrease Outcome: Progressing   Problem: Nutrition: Goal: Adequate nutrition will be maintained Outcome: Progressing   Problem: Coping: Goal: Level of anxiety will decrease Outcome: Progressing   Problem: Elimination: Goal: Will not experience complications related to bowel motility Outcome: Progressing Goal: Will not experience complications related to urinary retention Outcome: Progressing   Problem: Pain Managment: Goal: General experience of comfort will improve and/or be controlled Outcome: Progressing   Problem: Safety: Goal: Ability to remain free from injury will improve Outcome: Progressing   Problem: Skin Integrity: Goal: Risk for impaired skin integrity will decrease Outcome: Progressing   Problem: Safety: Goal: Non-violent Restraint(s) Outcome: Progressing   Problem: Activity: Goal: Ability to tolerate increased activity will improve Outcome: Progressing   Problem: Respiratory: Goal: Ability to maintain a clear airway and adequate ventilation will improve Outcome: Progressing   Problem: Role Relationship: Goal: Method of communication will improve Outcome:  Progressing   Problem: Education: Goal: Understanding of CV disease, CV risk reduction, and recovery process will improve Outcome: Progressing Goal: Individualized Educational Video(s) Outcome: Progressing   Problem: Activity: Goal: Ability to return to baseline activity level will improve Outcome: Progressing   Problem: Cardiovascular: Goal: Ability to achieve and maintain adequate cardiovascular perfusion will improve Outcome: Progressing Goal: Vascular access site(s) Level 0-1 will be maintained Outcome: Progressing   Problem: Health Behavior/Discharge Planning: Goal: Ability to safely manage health-related needs after discharge will improve Outcome: Progressing

## 2024-06-29 NOTE — Progress Notes (Signed)
 14 Days Post-Op   Subjective/Chief Complaint: Patient has no complaints today. Tolerating clears, now on soft and had ostomy output.    Objective: Vital signs in last 24 hours: Temp:  [97.7 F (36.5 C)-98.1 F (36.7 C)] 98.1 F (36.7 C) (07/07 0808) Pulse Rate:  [50-69] 62 (07/07 1015) Resp:  [11-21] 16 (07/07 1015) BP: (62-121)/(47-61) 121/49 (07/07 1015) SpO2:  [92 %-98 %] 96 % (07/07 1015) Weight:  [113.5 kg] 113.5 kg (07/07 0406) Last BM Date : 06/27/24  Intake/Output from previous day: 07/06 0701 - 07/07 0700 In: -  Out: 1850 [Urine:1850] Intake/Output this shift: Total I/O In: 70 [P.O.:60; I.V.:10] Out: -   Abdomen: Wound is intact and dressing in place. JP appears serous.  Ostomy with some melena in bag.  No gross blood noted.   Lab Results:  Recent Labs    06/28/24 1648 06/29/24 0412  WBC 8.9 7.5  HGB 7.9* 7.3*  HCT 23.1* 22.4*  PLT 201 197   BMET Recent Labs    06/27/24 0428 06/29/24 0412  NA 134* 133*  K 3.6 3.8  CL 102 100  CO2 24 27  GLUCOSE 112* 82  BUN 36* 18  CREATININE 1.04* 0.87  CALCIUM  8.1* 7.6*   PT/INR No results for input(s): LABPROT, INR in the last 72 hours. ABG No results for input(s): PHART, HCO3 in the last 72 hours.  Invalid input(s): PCO2, PO2  Studies/Results: No results found.   Anti-infectives: Anti-infectives (From admission, onward)    Start     Dose/Rate Route Frequency Ordered Stop   06/16/24 1800  vancomycin  (VANCOCIN ) IVPB 1000 mg/200 mL premix  Status:  Discontinued        1,000 mg 200 mL/hr over 60 Minutes Intravenous Every 24 hours 06/15/24 1317 06/15/24 1328   06/15/24 1700  vancomycin  (VANCOREADY) IVPB 2000 mg/400 mL  Status:  Discontinued        2,000 mg 200 mL/hr over 120 Minutes Intravenous  Once 06/15/24 1317 06/15/24 1328   06/15/24 1600  fluconazole  (DIFLUCAN ) IVPB 400 mg  Status:  Discontinued        400 mg 100 mL/hr over 120 Minutes Intravenous Every 24 hours 06/15/24 1317  06/16/24 0914   06/15/24 1500  metroNIDAZOLE  (FLAGYL ) IVPB 500 mg  Status:  Discontinued        500 mg 100 mL/hr over 60 Minutes Intravenous Every 12 hours 06/15/24 1216 06/15/24 1255   06/15/24 1400  ciprofloxacin  (CIPRO ) IVPB 400 mg  Status:  Discontinued        400 mg 200 mL/hr over 60 Minutes Intravenous Every 12 hours 06/15/24 1216 06/15/24 1255   06/15/24 1400  piperacillin -tazobactam (ZOSYN ) IVPB 3.375 g        3.375 g 12.5 mL/hr over 240 Minutes Intravenous Every 8 hours 06/15/24 1317 06/20/24 2359   06/15/24 1400  vancomycin  (VANCOCIN ) IVPB 1000 mg/200 mL premix  Status:  Discontinued        1,000 mg 200 mL/hr over 60 Minutes Intravenous Every 24 hours 06/15/24 1328 06/16/24 0914   06/15/24 1333  vancomycin  (VANCOCIN ) 1-5 GM/200ML-% IVPB       Note to Pharmacy: Romona Shaver E: cabinet override      06/15/24 1333 06/15/24 1410   06/15/24 1221  ciprofloxacin  (CIPRO ) 400 MG/200ML IVPB  Status:  Discontinued       Note to Pharmacy: Dannielle Railing S: cabinet override      06/15/24 1221 06/15/24 1343   06/15/24 1221  metroNIDAZOLE  (FLAGYL ) 500 MG/100ML  IVPB  Status:  Discontinued       Note to Pharmacy: Dannielle Railing S: cabinet override      06/15/24 1221 06/15/24 1343   06/12/24 1223  clindamycin  (CLEOCIN ) 900 MG/50ML IVPB       Note to Pharmacy: Elaine Nest L: cabinet override      06/12/24 1223 06/12/24 1332   06/12/24 1131  ceFAZolin  (ANCEF ) 2-4 GM/100ML-% IVPB  Status:  Discontinued       Note to Pharmacy: Billy Sluder H: cabinet override      06/12/24 1131 06/12/24 1434   06/11/24 1300  clindamycin  (CLEOCIN ) IVPB 900 mg  Status:  Discontinued        900 mg 100 mL/hr over 30 Minutes Intravenous On call to O.R. 06/10/24 1041 06/10/24 1447   06/11/24 1300  ceFAZolin  (ANCEF ) IVPB 2g/100 mL premix        2 g 200 mL/hr over 30 Minutes Intravenous On call to O.R. 06/10/24 1447 06/12/24 1908       Assessment/Plan:   Expand All Collapse All 11 Days Post-Op     Subjective/Chief Complaint: PT WANTS SOMETHING TO DRINK  NOT GETTING PAIN MEDS      Objective: Vital signs in last 24 hours: Temp:  [97.5 F (36.4 C)-98.4 F (36.9 C)] 97.6 F (36.4 C) (07/04 0800) Pulse Rate:  [48-68] 62 (07/04 0800) Resp:  [11-24] 20 (07/04 0800) BP: (92-125)/(39-93) 120/62 (07/04 0800) SpO2:  [92 %-100 %] 94 % (07/04 0800) Weight:  [887 kg] 112 kg (07/04 0541) Last BM Date : 06/25/24   Intake/Output from previous day: 07/03 0701 - 07/04 0700 In: 538.4 [I.V.:223.4; Blood:315] Out: 2095 [Urine:1850; Drains:85; Stool:160] Intake/Output this shift: No intake/output data recorded.   Abd: soft, appropriately tender, midline wound clean and honeycomb in place, JP with serosang output, colostomy with melena present.    Lab Results:  Recent Labs (last 2 labs)      Recent Labs    06/25/24 1541 06/26/24 0445  WBC 7.2 8.5  HGB 9.2* 8.5*  HCT 27.8* 26.0*  PLT 195 190      BMET Recent Labs (last 2 labs)      Recent Labs    06/25/24 0355 06/26/24 0445  NA 133* 136  K 3.8 3.6  CL 100 103  CO2 26 24  GLUCOSE 90 76  BUN 52* 40*  CREATININE 1.05* 1.18*  CALCIUM  8.2* 7.8*      PT/INR Recent Labs (last 2 labs)  No results for input(s): LABPROT, INR in the last 72 hours.   ABG  Recent Labs (last 2 labs)  No results for input(s): PHART, HCO3 in the last 72 hours.   Invalid input(s): PCO2, PO2     Studies/Results:  Imaging Results (Last 48 hours)  IR Transcath/Emboliz Result Date: 06/26/2024 INDICATION: Refractory upper GI bleed s/p endoscopy. EXAM: Title; COIL EMBOLIZATION OF GASTRODUODENAL ARTERY Listed procedures; 1.  ULTRASOUND GUIDANCE FOR ARTERIAL ACCESS 2. MESENTERIC ARTERIOGRAPHY, INCLUDING CELIAC, COMMON HEPATIC and GASTRODUODENAL ARTERIOGRAMS 3.  COIL EMBOLIZATION OF GASTRODUODENAL ARTERY COMPARISON:  CTA abdomen pelvis, 06/24/2024 MEDICATIONS: 4 mg Zofran  IV.  50 mg Benadryl  IV. ANESTHESIA/SEDATION: Moderate (conscious)  sedation was employed during this procedure. A total of Versed  1.5 mg and Fentanyl  75 mcg was administered intravenously. Moderate Sedation Time: 89 minutes. The patient's level of consciousness and vital signs were monitored continuously by radiology nursing throughout the procedure under my direct supervision. CONTRAST:  100 mL Omnipaque  300 FLUOROSCOPY: Radiation Exposure Index and estimated peak skin  dose (PSD); Reference air kerma (RAK), 448.5 mGy. Kerma-area product (KAP), 7499.3 uGy*m. COMPLICATIONS: None immediate. PROCEDURE: Informed consent was obtained from the patient and/or patient's representative following explanation of the procedure, risks, benefits and alternatives. All questions were addressed. A time out was performed prior to the initiation of the procedure. Maximal barrier sterile technique utilized including caps, mask, sterile gowns, sterile gloves, large sterile drape, hand hygiene, and chlorhexidine  prep. The RIGHT femoral head was marked fluoroscopically. Under sterile conditions and local anesthesia, the RIGHT common femoral artery access was performed with a micropuncture needle. Under direct ultrasound guidance, the RIGHT common femoral was accessed with a micropuncture kit. An ultrasound image was saved for documentation purposes. This allowed for placement of a 5 Fr 35 cm vascular sheath. A limited arteriogram was performed through the side arm of the sheath confirming appropriate access within the RIGHT common femoral artery. A limited abdominal aortogram was performed to help identify the celiac artery origin. Over a Bentson wire, a C2 catheter was advanced, back bled and flushed. The catheter was then utilized to select the celiac access, then advanced into the common hepatic then gastroduodenal arteries. Selective mesenteric arteriograms were performed at each level. Initial coil deployment was performed with the 5 Fr catheter within the gastroduodenal artery, then a 6 mm 0.035  inch Terumo Azure was attempted to be deployed, however due deployment mechanism fraying was noted on reposition attempt. The 5 Fr sheath was exchanged for an 8 Fr 35 cm vascular sheath, then a 6 Fr snare was deployed. The frayed deployment mechanism was ensnared, and the mechanism and coil were removed intact. The C2 catheter was readvanced and utilized to select the celiac axis, and finally back into the GDA. Using a 2.4 Fr microcatheter and 0.016 inch Fathom microwire access into the gastroduodenal artery was performed and a selective arteriogram was performed. Selective embolization with multiple 0.018 inch micro coils was performed. The microcatheter was removed and arteriogram with the 5 Fr catheter at the common hepatic artery was performed. Adequate pruning of the GDA feeder arteries was achieved on post embolization arteriogram. Images were reviewed and the procedure was terminated. All wires, catheters and sheaths were removed from the patient. Hemostasis was achieved at the RIGHT groin access site with Angio-Seal closure device. The patient tolerated the procedure well without immediate post procedural complication. FINDINGS: *Celiac and common hepatic arteriograms with normal order and branching. *Gastroduodenal arteriogram without active extravasation noted. *Embolization with adequate pruning of the GDA distribution arteries. *Access via the RIGHT femoral artery. Angio-Seal closure at RIGHT groin. Palpable RLE pulses at the end of the Case IMPRESSION: 1. 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 2. Successful empiric coil embolization of the gastroduodenal artery for refractory upper GI bleed. PLAN: - The patient is to remain flat for 2 hours with RIGHT leg straight. - The patient may continue to experience residual melena however should resolve in the coming days. Thom Hall, MD [facility]  Radiology Electronically Signed   By: Thom Hall M.D.   On: 06/26/2024 10:26    IR US  Guide Vasc Access Right Result Date: 06/26/2024 INDICATION: Refractory upper GI bleed s/p endoscopy. EXAM: Title; COIL EMBOLIZATION OF GASTRODUODENAL ARTERY Listed procedures; 1.  ULTRASOUND GUIDANCE FOR ARTERIAL ACCESS 2. MESENTERIC ARTERIOGRAPHY, INCLUDING CELIAC, COMMON HEPATIC and GASTRODUODENAL ARTERIOGRAMS 3.  COIL EMBOLIZATION OF GASTRODUODENAL ARTERY COMPARISON:  CTA abdomen pelvis, 06/24/2024 MEDICATIONS: 4 mg Zofran  IV.  50 mg Benadryl  IV. ANESTHESIA/SEDATION:  Moderate (conscious) sedation was employed during this procedure. A total of Versed  1.5 mg and Fentanyl  75 mcg was administered intravenously. Moderate Sedation Time: 89 minutes. The patient's level of consciousness and vital signs were monitored continuously by radiology nursing throughout the procedure under my direct supervision. CONTRAST:  100 mL Omnipaque  300 FLUOROSCOPY: Radiation Exposure Index and estimated peak skin dose (PSD); Reference air kerma (RAK), 448.5 mGy. Kerma-area product (KAP), 7499.3 uGy*m. COMPLICATIONS: None immediate. PROCEDURE: Informed consent was obtained from the patient and/or patient's representative following explanation of the procedure, risks, benefits and alternatives. All questions were addressed. A time out was performed prior to the initiation of the procedure. Maximal barrier sterile technique utilized including caps, mask, sterile gowns, sterile gloves, large sterile drape, hand hygiene, and chlorhexidine  prep. The RIGHT femoral head was marked fluoroscopically. Under sterile conditions and local anesthesia, the RIGHT common femoral artery access was performed with a micropuncture needle. Under direct ultrasound guidance, the RIGHT common femoral was accessed with a micropuncture kit. An ultrasound image was saved for documentation purposes. This allowed for placement of a 5 Fr 35 cm vascular sheath. A limited arteriogram  was performed through the side arm of the sheath confirming appropriate access within the RIGHT common femoral artery. A limited abdominal aortogram was performed to help identify the celiac artery origin. Over a Bentson wire, a C2 catheter was advanced, back bled and flushed. The catheter was then utilized to select the celiac access, then advanced into the common hepatic then gastroduodenal arteries. Selective mesenteric arteriograms were performed at each level. Initial coil deployment was performed with the 5 Fr catheter within the gastroduodenal artery, then a 6 mm 0.035 inch Terumo Azure was attempted to be deployed, however due deployment mechanism fraying was noted on reposition attempt. The 5 Fr sheath was exchanged for an 8 Fr 35 cm vascular sheath, then a 6 Fr snare was deployed. The frayed deployment mechanism was ensnared, and the mechanism and coil were removed intact. The C2 catheter was readvanced and utilized to select the celiac axis, and finally back into the GDA. Using a 2.4 Fr microcatheter and 0.016 inch Fathom microwire access into the gastroduodenal artery was performed and a selective arteriogram was performed. Selective embolization with multiple 0.018 inch micro coils was performed. The microcatheter was removed and arteriogram with the 5 Fr catheter at the common hepatic artery was performed. Adequate pruning of the GDA feeder arteries was achieved on post embolization arteriogram. Images were reviewed and the procedure was terminated. All wires, catheters and sheaths were removed from the patient. Hemostasis was achieved at the RIGHT groin access site with Angio-Seal closure device. The patient tolerated the procedure well without immediate post procedural complication. FINDINGS: *Celiac and common hepatic arteriograms with normal order and branching. *Gastroduodenal arteriogram without active extravasation noted. *Embolization with adequate pruning of the GDA distribution arteries.  *Access via the RIGHT femoral artery. Angio-Seal closure at RIGHT groin. Palpable RLE pulses at the end of the Case IMPRESSION: 1. 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 2. Successful empiric coil embolization of the gastroduodenal artery for refractory upper GI bleed. PLAN: - The patient is to remain flat for 2 hours with RIGHT leg straight. - The patient may continue to experience residual melena however should resolve in the coming days. Thom Hall, MD [facility] Radiology Electronically Signed   By: Thom Hall M.D.   On: 06/26/2024 10:26    IR Angiogram Visceral Selective Result  Date: 06/26/2024 INDICATION: Refractory upper GI bleed s/p endoscopy. EXAM: Title; COIL EMBOLIZATION OF GASTRODUODENAL ARTERY Listed procedures; 1.  ULTRASOUND GUIDANCE FOR ARTERIAL ACCESS 2. MESENTERIC ARTERIOGRAPHY, INCLUDING CELIAC, COMMON HEPATIC and GASTRODUODENAL ARTERIOGRAMS 3.  COIL EMBOLIZATION OF GASTRODUODENAL ARTERY COMPARISON:  CTA abdomen pelvis, 06/24/2024 MEDICATIONS: 4 mg Zofran  IV.  50 mg Benadryl  IV. ANESTHESIA/SEDATION: Moderate (conscious) sedation was employed during this procedure. A total of Versed  1.5 mg and Fentanyl  75 mcg was administered intravenously. Moderate Sedation Time: 89 minutes. The patient's level of consciousness and vital signs were monitored continuously by radiology nursing throughout the procedure under my direct supervision. CONTRAST:  100 mL Omnipaque  300 FLUOROSCOPY: Radiation Exposure Index and estimated peak skin dose (PSD); Reference air kerma (RAK), 448.5 mGy. Kerma-area product (KAP), 7499.3 uGy*m. COMPLICATIONS: None immediate. PROCEDURE: Informed consent was obtained from the patient and/or patient's representative following explanation of the procedure, risks, benefits and alternatives. All questions were addressed. A time out was performed prior to the initiation of the  procedure. Maximal barrier sterile technique utilized including caps, mask, sterile gowns, sterile gloves, large sterile drape, hand hygiene, and chlorhexidine  prep. The RIGHT femoral head was marked fluoroscopically. Under sterile conditions and local anesthesia, the RIGHT common femoral artery access was performed with a micropuncture needle. Under direct ultrasound guidance, the RIGHT common femoral was accessed with a micropuncture kit. An ultrasound image was saved for documentation purposes. This allowed for placement of a 5 Fr 35 cm vascular sheath. A limited arteriogram was performed through the side arm of the sheath confirming appropriate access within the RIGHT common femoral artery. A limited abdominal aortogram was performed to help identify the celiac artery origin. Over a Bentson wire, a C2 catheter was advanced, back bled and flushed. The catheter was then utilized to select the celiac access, then advanced into the common hepatic then gastroduodenal arteries. Selective mesenteric arteriograms were performed at each level. Initial coil deployment was performed with the 5 Fr catheter within the gastroduodenal artery, then a 6 mm 0.035 inch Terumo Azure was attempted to be deployed, however due deployment mechanism fraying was noted on reposition attempt. The 5 Fr sheath was exchanged for an 8 Fr 35 cm vascular sheath, then a 6 Fr snare was deployed. The frayed deployment mechanism was ensnared, and the mechanism and coil were removed intact. The C2 catheter was readvanced and utilized to select the celiac axis, and finally back into the GDA. Using a 2.4 Fr microcatheter and 0.016 inch Fathom microwire access into the gastroduodenal artery was performed and a selective arteriogram was performed. Selective embolization with multiple 0.018 inch micro coils was performed. The microcatheter was removed and arteriogram with the 5 Fr catheter at the common hepatic artery was performed. Adequate pruning of  the GDA feeder arteries was achieved on post embolization arteriogram. Images were reviewed and the procedure was terminated. All wires, catheters and sheaths were removed from the patient. Hemostasis was achieved at the RIGHT groin access site with Angio-Seal closure device. The patient tolerated the procedure well without immediate post procedural complication. FINDINGS: *Celiac and common hepatic arteriograms with normal order and branching. *Gastroduodenal arteriogram without active extravasation noted. *Embolization with adequate pruning of the GDA distribution arteries. *Access via the RIGHT femoral artery. Angio-Seal closure at RIGHT groin. Palpable RLE pulses at the end of the Case IMPRESSION: 1. 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 2. Successful empiric coil embolization of the gastroduodenal artery for refractory upper GI bleed. PLAN: - The patient  is to remain flat for 2 hours with RIGHT leg straight. - The patient may continue to experience residual melena however should resolve in the coming days. Thom Hall, MD [facility] [address] [facility] Radiology Electronically Signed   By: Thom Hall M.D.   On: 06/26/2024 10:26    IR Angiogram Visceral Selective Result Date: 06/26/2024 INDICATION: Refractory upper GI bleed s/p endoscopy. EXAM: Title; COIL EMBOLIZATION OF GASTRODUODENAL ARTERY Listed procedures; 1.  ULTRASOUND GUIDANCE FOR ARTERIAL ACCESS 2. MESENTERIC ARTERIOGRAPHY, INCLUDING CELIAC, COMMON HEPATIC and GASTRODUODENAL ARTERIOGRAMS 3.  COIL EMBOLIZATION OF GASTRODUODENAL ARTERY COMPARISON:  CTA abdomen pelvis, 06/24/2024 MEDICATIONS: 4 mg Zofran  IV.  50 mg Benadryl  IV. ANESTHESIA/SEDATION: Moderate (conscious) sedation was employed during this procedure. A total of Versed  1.5 mg and Fentanyl  75 mcg was administered intravenously. Moderate Sedation Time: 89 minutes. The patient's level of consciousness and  vital signs were monitored continuously by radiology nursing throughout the procedure under my direct supervision. CONTRAST:  100 mL Omnipaque  300 FLUOROSCOPY: Radiation Exposure Index and estimated peak skin dose (PSD); Reference air kerma (RAK), 448.5 mGy. Kerma-area product (KAP), 7499.3 uGy*m. COMPLICATIONS: None immediate. PROCEDURE: Informed consent was obtained from the patient and/or patient's representative following explanation of the procedure, risks, benefits and alternatives. All questions were addressed. A time out was performed prior to the initiation of the procedure. Maximal barrier sterile technique utilized including caps, mask, sterile gowns, sterile gloves, large sterile drape, hand hygiene, and chlorhexidine  prep. The RIGHT femoral head was marked fluoroscopically. Under sterile conditions and local anesthesia, the RIGHT common femoral artery access was performed with a micropuncture needle. Under direct ultrasound guidance, the RIGHT common femoral was accessed with a micropuncture kit. An ultrasound image was saved for documentation purposes. This allowed for placement of a 5 Fr 35 cm vascular sheath. A limited arteriogram was performed through the side arm of the sheath confirming appropriate access within the RIGHT common femoral artery. A limited abdominal aortogram was performed to help identify the celiac artery origin. Over a Bentson wire, a C2 catheter was advanced, back bled and flushed. The catheter was then utilized to select the celiac access, then advanced into the common hepatic then gastroduodenal arteries. Selective mesenteric arteriograms were performed at each level. Initial coil deployment was performed with the 5 Fr catheter within the gastroduodenal artery, then a 6 mm 0.035 inch Terumo Azure was attempted to be deployed, however due deployment mechanism fraying was noted on reposition attempt. The 5 Fr sheath was exchanged for an 8 Fr 35 cm vascular sheath, then a 6 Fr  snare was deployed. The frayed deployment mechanism was ensnared, and the mechanism and coil were removed intact. The C2 catheter was readvanced and utilized to select the celiac axis, and finally back into the GDA. Using a 2.4 Fr microcatheter and 0.016 inch Fathom microwire access into the gastroduodenal artery was performed and a selective arteriogram was performed. Selective embolization with multiple 0.018 inch micro coils was performed. The microcatheter was removed and arteriogram with the 5 Fr catheter at the common hepatic artery was performed. Adequate pruning of the GDA feeder arteries was achieved on post embolization arteriogram. Images were reviewed and the procedure was terminated. All wires, catheters and sheaths were removed from the patient. Hemostasis was achieved at the RIGHT groin access site with Angio-Seal closure device. The patient tolerated the procedure well without immediate post procedural complication. FINDINGS: *Celiac and common hepatic arteriograms with normal order and branching. *Gastroduodenal arteriogram without active extravasation noted. *Embolization  with adequate pruning of the GDA distribution arteries. *Access via the RIGHT femoral artery. Angio-Seal closure at RIGHT groin. Palpable RLE pulses at the end of the Case IMPRESSION: 1. 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 2. Successful empiric coil embolization of the gastroduodenal artery for refractory upper GI bleed. PLAN: - The patient is to remain flat for 2 hours with RIGHT leg straight. - The patient may continue to experience residual melena however should resolve in the coming days. Thom Hall, MD [facility] Radiology Electronically Signed   By: Thom Hall M.D.   On: 06/26/2024 10:26    IR Angiogram Visceral Selective Result Date: 06/26/2024 INDICATION: Refractory upper GI bleed s/p endoscopy. EXAM: Title;  COIL EMBOLIZATION OF GASTRODUODENAL ARTERY Listed procedures; 1.  ULTRASOUND GUIDANCE FOR ARTERIAL ACCESS 2. MESENTERIC ARTERIOGRAPHY, INCLUDING CELIAC, COMMON HEPATIC and GASTRODUODENAL ARTERIOGRAMS 3.  COIL EMBOLIZATION OF GASTRODUODENAL ARTERY COMPARISON:  CTA abdomen pelvis, 06/24/2024 MEDICATIONS: 4 mg Zofran  IV.  50 mg Benadryl  IV. ANESTHESIA/SEDATION: Moderate (conscious) sedation was employed during this procedure. A total of Versed  1.5 mg and Fentanyl  75 mcg was administered intravenously. Moderate Sedation Time: 89 minutes. The patient's level of consciousness and vital signs were monitored continuously by radiology nursing throughout the procedure under my direct supervision. CONTRAST:  100 mL Omnipaque  300 FLUOROSCOPY: Radiation Exposure Index and estimated peak skin dose (PSD); Reference air kerma (RAK), 448.5 mGy. Kerma-area product (KAP), 7499.3 uGy*m. COMPLICATIONS: None immediate. PROCEDURE: Informed consent was obtained from the patient and/or patient's representative following explanation of the procedure, risks, benefits and alternatives. All questions were addressed. A time out was performed prior to the initiation of the procedure. Maximal barrier sterile technique utilized including caps, mask, sterile gowns, sterile gloves, large sterile drape, hand hygiene, and chlorhexidine  prep. The RIGHT femoral head was marked fluoroscopically. Under sterile conditions and local anesthesia, the RIGHT common femoral artery access was performed with a micropuncture needle. Under direct ultrasound guidance, the RIGHT common femoral was accessed with a micropuncture kit. An ultrasound image was saved for documentation purposes. This allowed for placement of a 5 Fr 35 cm vascular sheath. A limited arteriogram was performed through the side arm of the sheath confirming appropriate access within the RIGHT common femoral artery. A limited abdominal aortogram was performed to help identify the celiac artery  origin. Over a Bentson wire, a C2 catheter was advanced, back bled and flushed. The catheter was then utilized to select the celiac access, then advanced into the common hepatic then gastroduodenal arteries. Selective mesenteric arteriograms were performed at each level. Initial coil deployment was performed with the 5 Fr catheter within the gastroduodenal artery, then a 6 mm 0.035 inch Terumo Azure was attempted to be deployed, however due deployment mechanism fraying was noted on reposition attempt. The 5 Fr sheath was exchanged for an 8 Fr 35 cm vascular sheath, then a 6 Fr snare was deployed. The frayed deployment mechanism was ensnared, and the mechanism and coil were removed intact. The C2 catheter was readvanced and utilized to select the celiac axis, and finally back into the GDA. Using a 2.4 Fr microcatheter and 0.016 inch Fathom microwire access into the gastroduodenal artery was performed and a selective arteriogram was performed. Selective embolization with multiple 0.018 inch micro coils was performed. The microcatheter was removed and arteriogram with the 5 Fr catheter at the common hepatic artery was performed. Adequate pruning of the GDA feeder arteries was achieved on post embolization arteriogram. Images  were reviewed and the procedure was terminated. All wires, catheters and sheaths were removed from the patient. Hemostasis was achieved at the RIGHT groin access site with Angio-Seal closure device. The patient tolerated the procedure well without immediate post procedural complication. FINDINGS: *Celiac and common hepatic arteriograms with normal order and branching. *Gastroduodenal arteriogram without active extravasation noted. *Embolization with adequate pruning of the GDA distribution arteries. *Access via the RIGHT femoral artery. Angio-Seal closure at RIGHT groin. Palpable RLE pulses at the end of the Case IMPRESSION: 1. Mesenteric arteriography without angiographic evidence of active  extravasation at the duodenum. 2. Successful empiric coil embolization of the gastroduodenal artery for refractory upper GI bleed. PLAN: - The patient is to remain flat for 2 hours with RIGHT leg straight. - The patient may continue to experience residual melena however should resolve in the coming days. Thom Hall, MD [facility] Radiology Electronically Signed   By: Thom Hall M.D.   On: 06/26/2024 10:26    CT ANGIO GI BLEED Result Date: 06/24/2024 CLINICAL DATA:  Melena with acute blood-loss and anemia. Status post exploratory laparotomy and Arlyss patch placement for duodenitis and duodenal ulcer with perforation on 06/15/2024. Clinical concern for determining if there is active gastrointestinal bleeding. EXAM: CTA ABDOMEN AND PELVIS WITHOUT AND WITH CONTRAST TECHNIQUE: Multidetector CT imaging of the abdomen and pelvis was performed using the standard protocol during bolus administration of intravenous contrast. Multiplanar reconstructed images and MIPs were obtained and reviewed to evaluate the vascular anatomy. RADIATION DOSE REDUCTION: This exam was performed according to the departmental dose-optimization program which includes automated exposure control, adjustment of the mA and/or kV according to patient size and/or use of iterative reconstruction technique. CONTRAST:  OMNIPAQUE  IOHEXOL  350 MG/ML SOLN COMPARISON:  Abdomen and pelvis CT dated 06/15/2024. FINDINGS: VASCULAR Aorta: Atheromatous changes without stenosis, aneurysm or dissection. Celiac: Patent without evidence of aneurysm, dissection, vasculitis or significant stenosis. SMA: Patent without evidence of aneurysm, dissection, vasculitis or significant stenosis. Renals: 2 right renal arteries and 1 left renal artery. There is some plaque formation in the proximal, upper right renal artery producing approximately 50% stenosis. No plaque formation or stenosis involving the more inferior  right renal artery. Calcified plaque at the origin of the left renal artery with a proximally 70% stenosis. IMA: Calcified plaque throughout the inferior aspect of the abdominal aorta without opacification of the inferior mesenteric artery. Inflow: Bilateral calcified plaque formation without aneurysm or significant stenosis. Proximal Outflow: Bilateral calcified plaque formation without aneurysm or significant stenosis. Veins: No obvious venous abnormality within the limitations of this arterial phase study. Review of the MIP images confirms the above findings. NON-VASCULAR Lower chest: Moderate-sized right pleural effusion and small to moderate-sized left pleural effusion. Associated bilateral lower lobe compressive atelectasis. The included portion of the heart is mildly enlarged with no pericardial effusion seen. Hepatobiliary: No focal liver abnormality is seen. Status post cholecystectomy. No biliary dilatation. Pancreas: Unremarkable. No pancreatic ductal dilatation or surrounding inflammatory changes. Spleen: Normal in size without focal abnormality. Adrenals/Urinary Tract: Foley catheter in the urinary bladder. Otherwise, unremarkable bladder and adrenal glands. Simple appearing exophytic cyst arising from the left kidney, not needing imaging follow-up. 7 mm calculus in the upper left renal pelvis. Interval minimal dilatation of the left renal collecting system to the level of the ureteropelvic junction with no obstructing stone or mass seen. No ureteral dilatation. Stomach/Bowel: Interval diffuse low-density gastric wall thickening with mucosal enhancement, most pronounced involving the gastric antrum. Increased  high density material in the 2nd portion of the duodenum on the initial postcontrast images, unchanged on the postcontrast images. No active contrast extravasation seen in the stomach or bowel. Status post partial colectomy including the proximal sigmoid colon. Enteric contrast in the excluded  distal sigmoid colon and rectum, unchanged on the precontrast and postcontrast images. Multiple sigmoid colon diverticula in that region without evidence of diverticulitis. Unremarkable small bowel. Surgically absent appendix. 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 Inferior to the ostomy, large ventral hernia containing herniated stomach, colon and small bowel is again demonstrated. No evidence of obstruction. Lymphatic: No enlarged lymph nodes. Reproductive: Status post hysterectomy. No adnexal masses. Other: Stable large ventral hernia containing herniated stomach, small bowel and colon without obstruction. An upper abdominal surgical drain is in place with no adjacent fluid. Musculoskeletal: Extensive lumbar and thoracic spine degenerative changes. IMPRESSION: 1. No active contrast extravasation seen in the stomach or bowel. 2. Interval diffuse low-density gastric wall thickening with mucosal enhancement, most pronounced involving the gastric antrum. This is consistent with gastritis. 3. Moderate-sized right pleural effusion and small to moderate-sized left pleural effusion with associated bilateral lower lobe compressive atelectasis. 4. Mild cardiomegaly. 5. 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 6. 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 7. Interval minimal dilatation of the left renal collecting system to the level of the ureteropelvic junction with no obstructing stone or mass seen. This could be due to a recently passed stone or a UPJ obstruction. 8. Stable large ventral hernia containing herniated stomach, small bowel and colon without obstruction. 9. Status post partial colectomy including the proximal sigmoid colon. Enteric contrast in the excluded distal sigmoid colon and rectum, unchanged on the precontrast and postcontrast images. Multiple sigmoid colon diverticula in that region without evidence of  diverticulitis. 10. 7 mm nonobstructing calculus in the upper left renal pelvis. 11. Aortic atherosclerosis. Aortic Atherosclerosis (ICD10-I70.0). Electronically Signed   By: Elspeth Bathe M.D.   On: 06/24/2024 15:00       Anti-infectives: Anti-infectives (From admission, onward)        Start     Dose/Rate Route Frequency Ordered Stop    06/16/24 1800   vancomycin  (VANCOCIN ) IVPB 1000 mg/200 mL premix  Status:  Discontinued        1,000 mg 200 mL/hr over 60 Minutes Intravenous Every 24 hours 06/15/24 1317 06/15/24 1328    06/15/24 1700   vancomycin  (VANCOREADY) IVPB 2000 mg/400 mL  Status:  Discontinued        2,000 mg 200 mL/hr over 120 Minutes Intravenous  Once 06/15/24 1317 06/15/24 1328    06/15/24 1600   fluconazole  (DIFLUCAN ) IVPB 400 mg  Status:  Discontinued        400 mg 100 mL/hr over 120 Minutes Intravenous Every 24 hours 06/15/24 1317 06/16/24 0914    06/15/24 1500   metroNIDAZOLE  (FLAGYL ) IVPB 500 mg  Status:  Discontinued        500 mg 100 mL/hr over 60 Minutes Intravenous Every 12 hours 06/15/24 1216 06/15/24 1255    06/15/24 1400   ciprofloxacin  (CIPRO ) IVPB 400 mg  Status:  Discontinued        400 mg 200 mL/hr over 60 Minutes Intravenous Every 12 hours 06/15/24 1216 06/15/24 1255    06/15/24 1400   piperacillin -tazobactam (ZOSYN ) IVPB 3.375 g        3.375 g 12.5 mL/hr over 240 Minutes Intravenous  Every 8 hours 06/15/24 1317 06/20/24 2359    06/15/24 1400   vancomycin  (VANCOCIN ) IVPB 1000 mg/200 mL premix  Status:  Discontinued        1,000 mg 200 mL/hr over 60 Minutes Intravenous Every 24 hours 06/15/24 1328 06/16/24 0914    06/15/24 1333   vancomycin  (VANCOCIN ) 1-5 GM/200ML-% IVPB       Note to Pharmacy: Romona Shaver E: cabinet override         06/15/24 1333 06/15/24 1410    06/15/24 1221   ciprofloxacin  (CIPRO ) 400 MG/200ML IVPB  Status:  Discontinued       Note to Pharmacy: Dannielle Railing S: cabinet override         06/15/24 1221 06/15/24 1343    06/15/24  1221   metroNIDAZOLE  (FLAGYL ) 500 MG/100ML IVPB  Status:  Discontinued       Note to Pharmacy: Dannielle Railing S: cabinet override         06/15/24 1221 06/15/24 1343    06/12/24 1223   clindamycin  (CLEOCIN ) 900 MG/50ML IVPB       Note to Pharmacy: Elaine Nest L: cabinet override         06/12/24 1223 06/12/24 1332    06/12/24 1131   ceFAZolin  (ANCEF ) 2-4 GM/100ML-% IVPB  Status:  Discontinued       Note to Pharmacy: Billy Sluder H: cabinet override         06/12/24 1131 06/12/24 1434    06/11/24 1300   clindamycin  (CLEOCIN ) IVPB 900 mg  Status:  Discontinued        900 mg 100 mL/hr over 30 Minutes Intravenous On call to O.R. 06/10/24 1041 06/10/24 1447    06/11/24 1300   ceFAZolin  (ANCEF ) IVPB 2g/100 mL premix        2 g 200 mL/hr over 30 Minutes Intravenous On call to O.R. 06/10/24 1447 06/12/24 1908             Assessment/Plan: POD 13 , s/p ex lap with graham patch repair and JP drain placement for Perforated duodenal ulcer, possible bleeding ulcer as well, Dr. 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 Pylori negative -will actually leave her surgical drain for right now to assure no leak from necrosis after AE - NPO while undergoing GIB workup - CT GI bleed protocol     - Pending results will need to consider further angioembolization v discussion regarding timing of EGD. Ideally would be further out from surgery before any EGD. -multi-modal pain control -PT, OOB as able pending femur fx recs - PT/INR ordered    FEN - NPO VTE - on hold due to anemia ID - [email or web address] , completed     LOS: 22 days    Richerd Silversmith, MD 06/29/2024

## 2024-06-29 NOTE — Progress Notes (Signed)
 Physical Therapy Treatment Patient Details Name: Christina Castaneda MRN: [id number] DOB: 12-19-1956 Today's Date: 06/29/2024   History of Present Illness Pt is a 68 y/o F presenting to [facility] ED on 6/15 after fall, found to have L femoral neck fx and AKI. S/p retrograde IMN on 6/20. Hgb drop on 6/23 with dark ostomy output, transfused, subsequent hypotension requiring pressors. Found to have perforated bleeding duodenal ulcer s/p ex lap on 6/23. Transferred to [facility] for PCCM. 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    PT Comments  Pt with concerns about mobilizing out of bed due to fear of abdominal pain; she is actually pending a CT scan as well, so opted to work on bed level exercises today. Pt tolerated BUE exercises with yellow theraband and BLE AROM/AAROM. Discussed with orthopedic surgeon, who stated pt has no left knee ROM restrictions and to wear immobilizer when ambulating, so this PT removed it while pt was lying in supine position and notified RN. Patient will benefit from continued inpatient follow up therapy, <3 hours/day in order to address strengthening, ROM, and functional mobility.    If plan is discharge home, recommend the following: Two people to help with walking and/or transfers;Two people to help with bathing/dressing/bathroom   Can travel by private vehicle     No  Equipment Recommendations  None recommended by PT    Recommendations for Other Services       Precautions / Restrictions Precautions Precautions: Fall;Other (comment) Recall of Precautions/Restrictions: Intact Precaution/Restrictions Comments: JP drain, colostomy, sacral wound Required Braces or Orthoses: Knee Immobilizer - Left Knee Immobilizer - Left: On when out of bed or walking Restrictions Weight Bearing Restrictions Per Provider Order: Yes LLE Weight Bearing Per Provider Order: Weight bearing as tolerated Other Position/Activity Restrictions: No L  knee ROM restrictions     Mobility  Bed Mobility               General bed mobility comments: deferred by pt    Transfers                   General transfer comment: deferred by pt    Ambulation/Gait                   Stairs             Wheelchair Mobility     Tilt Bed    Modified Rankin (Stroke Patients Only)       Balance                                            Communication Communication Communication: No apparent difficulties  Cognition Arousal: Alert Behavior During Therapy: Flat affect   PT - Cognitive impairments: No apparent impairments                         Following commands: Intact      Cueing Cueing Techniques: Verbal cues  Exercises General Exercises - Upper Extremity Shoulder Flexion: Strengthening, Both, 10 reps, Supine, Theraband Theraband Level (Shoulder Flexion): Level 1 (Yellow) Elbow Flexion: Strengthening, Both, 10 reps, Theraband, Supine Theraband Level (Elbow Flexion): Level 1 (Yellow) General Exercises - Lower Extremity Ankle Circles/Pumps: AROM, Both, 20 reps, Supine Quad Sets: AROM, Both, Supine, 15 reps Short Arc Quad: Both, 10 reps, Supine Heel Slides:  Right, 10 reps, Supine, AROM, Other (comment) (pt unable to tolerate on LLE) Hip ABduction/ADduction: AAROM, Both, 10 reps, Supine    General Comments        Pertinent Vitals/Pain Pain Assessment Pain Assessment: Faces Faces Pain Scale: Hurts even more Pain Location: abdomen Pain Descriptors / Indicators: Guarding, Grimacing, Sore Pain Intervention(s): Limited activity within patient's tolerance, Monitored during session    Home Living                          Prior Function            PT Goals (current goals can now be found in the care plan section) Acute Rehab PT Goals Patient Stated Goal: home Time For Goal Achievement: 07/13/24 Potential to Achieve Goals: Fair    Frequency    Min  2X/week      PT Plan      Co-evaluation              AM-PAC PT 6 Clicks Mobility   Outcome Measure  Help needed turning from your back to your side while in a flat bed without using bedrails?: A Lot Help needed moving from lying on your back to sitting on the side of a flat bed without using bedrails?: Total Help needed moving to and from a bed to a chair (including a wheelchair)?: Total Help needed standing up from a chair using your arms (e.g., wheelchair or bedside chair)?: Total Help needed to walk in hospital room?: Total Help needed climbing 3-5 steps with a railing? : Total 6 Click Score: 7    End of Session Equipment Utilized During Treatment: Oxygen Activity Tolerance: Patient limited by pain Patient left: in bed;with call bell/phone within reach Nurse Communication: Mobility status PT Visit Diagnosis: Other abnormalities of gait and mobility (R26.89);History of falling (Z91.81);Muscle weakness (generalized) (M62.81);Pain Pain - Right/Left: Left Pain - part of body: Hip;Knee;Leg     Time: 8893-8867 PT Time Calculation (min) (ACUTE ONLY): 26 min  Charges:    $Therapeutic Exercise: 23-37 mins PT General Charges $$ ACUTE PT VISIT: 1 Visit                     Aleck Daring, PT, DPT Acute Rehabilitation Services Office [phone]    Alayne ONEIDA Daring 06/29/2024, 1:25 PM

## 2024-06-29 NOTE — Progress Notes (Signed)
   06/29/24 1655  Provider Notification  Provider Name/Title Raenelle Grew, MD  Date Provider Notified 06/29/24  Time Provider Notified 1655  Method of Notification Page (epic chat)  Notification Reason Critical Result  Test performed and critical result hgb 6.7  Date Critical Result Received 06/29/24  Time Critical Result Received 1655  Provider response See new orders  Date of Provider Response 06/29/24  Time of Provider Response 1656

## 2024-06-30 DIAGNOSIS — D62 Acute posthemorrhagic anemia: Secondary | ICD-10-CM | POA: Diagnosis not present

## 2024-06-30 DIAGNOSIS — K921 Melena: Secondary | ICD-10-CM

## 2024-06-30 DIAGNOSIS — J449 Chronic obstructive pulmonary disease, unspecified: Secondary | ICD-10-CM | POA: Diagnosis not present

## 2024-06-30 DIAGNOSIS — G4733 Obstructive sleep apnea (adult) (pediatric): Secondary | ICD-10-CM | POA: Diagnosis not present

## 2024-06-30 DIAGNOSIS — N179 Acute kidney failure, unspecified: Secondary | ICD-10-CM | POA: Diagnosis not present

## 2024-06-30 DIAGNOSIS — K276 Chronic or unspecified peptic ulcer, site unspecified, with both hemorrhage and perforation: Secondary | ICD-10-CM | POA: Diagnosis not present

## 2024-06-30 LAB — CBC
HCT: 20.9 % — ABNORMAL LOW (ref 36.0–46.0)
HCT: 22.3 % — ABNORMAL LOW (ref 36.0–46.0)
HCT: 24.4 % — ABNORMAL LOW (ref 36.0–46.0)
Hemoglobin: 6.9 g/dL — CL (ref 12.0–15.0)
Hemoglobin: 7.5 g/dL — ABNORMAL LOW (ref 12.0–15.0)
Hemoglobin: 8.3 g/dL — ABNORMAL LOW (ref 12.0–15.0)
MCH: 30.8 pg (ref 26.0–34.0)
MCH: 31.3 pg (ref 26.0–34.0)
MCH: 30.9 pg (ref 26.0–34.0)
MCHC: 33 g/dL (ref 30.0–36.0)
MCHC: 33.6 g/dL (ref 30.0–36.0)
MCHC: 34 g/dL (ref 30.0–36.0)
MCV: 93.3 fL (ref 80.0–100.0)
MCV: 92.9 fL (ref 80.0–100.0)
MCV: 90.7 fL (ref 80.0–100.0)
Platelets: 208 K/uL (ref 150–400)
Platelets: 198 K/uL (ref 150–400)
Platelets: 210 K/uL (ref 150–400)
RBC: 2.24 MIL/uL — ABNORMAL LOW (ref 3.87–5.11)
RBC: 2.4 MIL/uL — ABNORMAL LOW (ref 3.87–5.11)
RBC: 2.69 MIL/uL — ABNORMAL LOW (ref 3.87–5.11)
RDW: 19.1 % — ABNORMAL HIGH (ref 11.5–15.5)
RDW: 17.9 % — ABNORMAL HIGH (ref 11.5–15.5)
RDW: 17.8 % — ABNORMAL HIGH (ref 11.5–15.5)
WBC: 8.1 K/uL (ref 4.0–10.5)
WBC: 8.5 K/uL (ref 4.0–10.5)
WBC: 9.4 K/uL (ref 4.0–10.5)
nRBC: 0 % (ref 0.0–0.2)
nRBC: 0.2 % (ref 0.0–0.2)
nRBC: 0.2 % (ref 0.0–0.2)

## 2024-06-30 LAB — GLOBAL TEG PANEL
CFF Max Amplitude: 29 mm (ref 15–32)
CK with Heparinase (R): 5.1 min (ref 4.3–8.3)
Citrated Functional Fibrinogen: 529.2 mg/dL (ref 278–581)
Citrated Kaolin (K): 0.8 min (ref 0.8–2.1)
Citrated Kaolin (MA): 69.3 mm — ABNORMAL HIGH (ref 52–69)
Citrated Kaolin (R): 4.7 min (ref 4.6–9.1)
Citrated Kaolin Angle: 79.5 deg — ABNORMAL HIGH (ref 63–78)
Citrated Rapid TEG (MA): 68.5 mm (ref 52–70)

## 2024-06-30 LAB — PREPARE RBC (CROSSMATCH)

## 2024-06-30 MED ORDER — DIPHENHYDRAMINE HCL 25 MG PO CAPS
25.0000 mg | ORAL_CAPSULE | Freq: Once | ORAL | Status: AC
  Administered 2024-06-30: 25 mg via ORAL
  Filled 2024-06-30: qty 1

## 2024-06-30 MED ORDER — SODIUM CHLORIDE 0.9% IV SOLUTION: Freq: Once | INTRAVENOUS | Status: AC

## 2024-06-30 MED ORDER — FUROSEMIDE 10 MG/ML IJ SOLN
20.0000 mg | Freq: Once | INTRAMUSCULAR | Status: AC
  Administered 2024-06-30: 20 mg via INTRAVENOUS
  Filled 2024-06-30: qty 2

## 2024-06-30 MED ORDER — ACETAMINOPHEN 325 MG PO TABS
650.0000 mg | ORAL_TABLET | Freq: Once | ORAL | Status: AC
  Administered 2024-06-30: 650 mg via ORAL
  Filled 2024-06-30: qty 2

## 2024-06-30 MED FILL — Fentanyl Citrate Preservative Free (PF) Inj 100 MCG/2ML: INTRAMUSCULAR | Qty: 2 | Status: AC

## 2024-06-30 NOTE — Progress Notes (Signed)
 Progress Note  15 Days Post-Op  Subjective: Pt denies worsening abdominal pain. Did report some nausea this AM. Having dark stool output   Objective: Vital signs in last 24 hours: Temp:  [97.8 F (36.6 C)-98.6 F (37 C)] 98.3 F (36.8 C) (07/08 0851) Pulse Rate:  [58-84] 84 (07/08 0851) Resp:  [12-18] 16 (07/08 0851) BP: (97-123)/(53-86) 104/55 (07/08 0851) SpO2:  [94 %-100 %] 100 % (07/08 0851) FiO2 (%):  [32 %] 32 % (07/07 2010) Weight:  [114.6 kg] 114.6 kg (07/08 0500) Last BM Date : 06/30/24  Intake/Output from previous day: 07/07 0701 - 07/08 0700 In: 310 [P.O.:300; I.V.:10] Out: 1340 [Urine:1250; Drains:90] Intake/Output this shift: Total I/O In: 253 [P.O.:240; I.V.:13] Out: -   PE: General: WD, morbidly obese female who is laying in bed in NAD Heart: regular, rate, and rhythm. Lungs: Respiratory effort nonlabored Abd: soft, appropriately ttp, ostomy viable with some melanotic stool present, drain serous, incision C/D/I with staples present    Lab Results:  Recent Labs    06/29/24 1600 06/30/24 0500  WBC 9.7 8.1  HGB 6.7* 6.9*  HCT 20.2* 20.9*  PLT 214 208   BMET Recent Labs    06/29/24 0412  NA 133*  K 3.8  CL 100  CO2 27  GLUCOSE 82  BUN 18  CREATININE 0.87  CALCIUM  7.6*   PT/INR Recent Labs    06/29/24 1538  LABPROT 17.8*  INR 1.4*   CMP     Component Value Date/Time   NA 133 (L) 06/29/2024 0412   K 3.8 06/29/2024 0412   CL 100 06/29/2024 0412   CO2 27 06/29/2024 0412   GLUCOSE 82 06/29/2024 0412   BUN 18 06/29/2024 0412   CREATININE 0.87 06/29/2024 0412   CALCIUM  7.6 (L) 06/29/2024 0412   PROT 4.5 (L) 06/22/2024 0459   ALBUMIN <1.5 (L) 06/22/2024 0459   AST 35 06/22/2024 0459   ALT 22 06/22/2024 0459   ALKPHOS 42 06/22/2024 0459   BILITOT 0.6 06/22/2024 0459   GFRNONAA >60 06/29/2024 0412   GFRAA >60 06/05/2016 1320   Lipase     Component Value Date/Time   LIPASE 64 (H) 03/28/2016 1705        Studies/Results: CT ANGIO GI BLEED Result Date: 06/29/2024 CLINICAL DATA:  Upper GI bleed. EXAM: CTA ABDOMEN AND PELVIS WITHOUT AND WITH CONTRAST TECHNIQUE: Multidetector CT imaging of the abdomen and pelvis was performed using the standard protocol during bolus administration of intravenous contrast. Multiplanar reconstructed images and MIPs were obtained and reviewed to evaluate the vascular anatomy. RADIATION DOSE REDUCTION: This exam was performed according to the departmental dose-optimization program which includes automated exposure control, adjustment of the mA and/or kV according to patient size and/or use of iterative reconstruction technique. CONTRAST:  OMNIPAQUE  IOHEXOL  350 MG/ML SOLN COMPARISON:  CT dated 06/24/2024. FINDINGS: VASCULAR Aorta: Moderate atherosclerotic calcification. No aneurysmal dilatation or dissection. Celiac: The celiac artery, splenic, and left gastric artery branches are patent. Coil embolization of the gastroduodenal artery. SMA: The SMA is patent. Renals: The renal arteries are patent. Duplicated right renal artery anatomy. IMA: Diminished opacification of the IMA. Inflow: Mild atherosclerotic calcification. The iliac arteries are patent. No evidence of dilatation or dissection. Proximal Outflow: The visualized proximal outflow is patent. Veins: The IVC is unremarkable. The SMV, splenic vein, and main portal vein are patent. No portal venous gas. Review of the MIP images confirms the above findings. NON-VASCULAR Lower chest: Partially visualized moderate-sized bilateral pleural effusions with  compressive atelectasis of the adjacent lungs. No intra-abdominal free air or free fluid. Hepatobiliary: Mild irregularity of the liver contour may represent early changes of cirrhosis. There is mild biliary dilatation, post cholecystectomy. No retained calcified stone noted in the central CBD. A drainage catheter noted inferior to the liver. No drainable fluid collection  noted along the catheter. Pancreas: Unremarkable. No pancreatic ductal dilatation or surrounding inflammatory changes. Spleen: Normal in size without focal abnormality. Adrenals/Urinary Tract: The adrenal glands are unremarkable. There is an 8 mm nonobstructing left renal upper pole calculus. Duplicated right renal collecting system. A 2.7 x 2.7 cm hypoenhancing area in the inferior pole of the right kidney is new since the prior CT may represent an area of infarct or a lobar nephronia/developing abscess. There is mild right hydronephrosis. The urinary bladder is decompressed around a Foley catheter. Stomach/Bowel: Retained oral contrast noted in the colon. 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 There is sigmoid diverticulosis. There is no bowel obstruction. Evaluation for active GI bleed is very limited due to presence of oral contrast. No extravasated contrast noted to suggest active GI bleed. There is inflammatory changes and thickening of the distal stomach which may represent gastritis or related to a gastric ulcer. Appendectomy. Lymphatic: No adenopathy. Reproductive: Hysterectomy. Other: Large ventral hernia containing portion of the stomach, loops of small bowel and part of colon. There is mild upper mesentery, and periportal edema. Musculoskeletal: Osteopenia with degenerative changes. No acute osseous pathology. IMPRESSION: 1. No evidence of active GI bleed. 2. Inflammatory changes and thickening of the distal stomach may represent gastritis or related to a gastric ulcer. Sigmoid diverticulosis. No bowel obstruction. 3. Partially visualized moderate-sized bilateral pleural effusions with compressive atelectasis of the adjacent lungs. 4. Large bowel containing ventral hernia.  No bowel obstruction. 5. A 2.7 x 2.7 cm hypoenhancing area in the inferior pole of the right kidney is new since the prior CT may represent an area of infarct or a lobar nephronia/developing abscess.  There is mild right hydronephrosis. 6. An 8 mm nonobstructing left renal upper pole calculus. 7.  Aortic Atherosclerosis (ICD10-I70.0). Electronically Signed   By: Vanetta Chou M.D.   On: 06/29/2024 13:34    Anti-infectives: Anti-infectives (From admission, onward)    Start     Dose/Rate Route Frequency Ordered Stop   06/16/24 1800  vancomycin  (VANCOCIN ) IVPB 1000 mg/200 mL premix  Status:  Discontinued        1,000 mg 200 mL/hr over 60 Minutes Intravenous Every 24 hours 06/15/24 1317 06/15/24 1328   06/15/24 1700  vancomycin  (VANCOREADY) IVPB 2000 mg/400 mL  Status:  Discontinued        2,000 mg 200 mL/hr over 120 Minutes Intravenous  Once 06/15/24 1317 06/15/24 1328   06/15/24 1600  fluconazole  (DIFLUCAN ) IVPB 400 mg  Status:  Discontinued        400 mg 100 mL/hr over 120 Minutes Intravenous Every 24 hours 06/15/24 1317 06/16/24 0914   06/15/24 1500  metroNIDAZOLE  (FLAGYL ) IVPB 500 mg  Status:  Discontinued        500 mg 100 mL/hr over 60 Minutes Intravenous Every 12 hours 06/15/24 1216 06/15/24 1255   06/15/24 1400  ciprofloxacin  (CIPRO ) IVPB 400 mg  Status:  Discontinued        400 mg 200 mL/hr over 60 Minutes Intravenous Every 12 hours 06/15/24 1216 06/15/24 1255   06/15/24 1400  piperacillin -tazobactam (ZOSYN ) IVPB 3.375 g        3.375  g 12.5 mL/hr over 240 Minutes Intravenous Every 8 hours 06/15/24 1317 06/20/24 2359   06/15/24 1400  vancomycin  (VANCOCIN ) IVPB 1000 mg/200 mL premix  Status:  Discontinued        1,000 mg 200 mL/hr over 60 Minutes Intravenous Every 24 hours 06/15/24 1328 06/16/24 0914   06/15/24 1333  vancomycin  (VANCOCIN ) 1-5 GM/200ML-% IVPB       Note to Pharmacy: Romona Shaver E: cabinet override      06/15/24 1333 06/15/24 1410   06/15/24 1221  ciprofloxacin  (CIPRO ) 400 MG/200ML IVPB  Status:  Discontinued       Note to Pharmacy: Dannielle Railing S: cabinet override      06/15/24 1221 06/15/24 1343   06/15/24 1221  metroNIDAZOLE  (FLAGYL ) 500 MG/100ML  IVPB  Status:  Discontinued       Note to Pharmacy: Dannielle Railing S: cabinet override      06/15/24 1221 06/15/24 1343   06/12/24 1223  clindamycin  (CLEOCIN ) 900 MG/50ML IVPB       Note to Pharmacy: Elaine Nest L: cabinet override      06/12/24 1223 06/12/24 1332   06/12/24 1131  ceFAZolin  (ANCEF ) 2-4 GM/100ML-% IVPB  Status:  Discontinued       Note to Pharmacy: Billy Sluder H: cabinet override      06/12/24 1131 06/12/24 1434   06/11/24 1300  clindamycin  (CLEOCIN ) IVPB 900 mg  Status:  Discontinued        900 mg 100 mL/hr over 30 Minutes Intravenous On call to O.R. 06/10/24 1041 06/10/24 1447   06/11/24 1300  ceFAZolin  (ANCEF ) IVPB 2g/100 mL premix        2 g 200 mL/hr over 30 Minutes Intravenous On call to O.R. 06/10/24 1447 06/12/24 1908        Assessment/Plan POD 14 , s/p ex lap with graham patch repair and JP drain placement for Perforated duodenal ulcer, possible bleeding ulcer as well, Dr. Johanna at [facility] 6/23, now with melena - IR embolized (7/4) old blood in colostomy noted   - Hgb down to 6.9 from 7.3 yesterday AM - h. Pylori negative - will actually leave her surgical drain for right now to assure no leak from necrosis after AE - NPO while undergoing GIB workup - CT GI bleed protocol - negative for active bleeding but concern for thickening of distal stomach     - consider asking IR to eval for further angioembolization to eval if GDA fully occluded. Ideally would be further out from surgery before any EGD. GI consulted by primary  - multi-modal pain control - PT, OOB as able pending femur fx recs - TEG ordered      FEN - NPO VTE - on hold due to anemia ID - [email or web address] , completed    LOS: 23 days   I reviewed hospitalist notes, last 24 h vitals and pain scores, last 48 h intake and output, last 24 h labs and trends, and last 24 h imaging results.  This care required high  level of medical decision making.    Burnard JONELLE Louder, [facility]  Surgery 06/30/2024, 11:11 AM Please see Amion for pager number during day hours 7:00am-4:30pm

## 2024-06-30 NOTE — Progress Notes (Signed)
 Physical Therapy Wound Treatment Patient Details  Name: Christina Castaneda MRN: [id number] Date of Birth: [birth year]  Today's Date: 06/30/2024 Time: 8776-8684 Time Calculation (min): 52 min  Subjective  Subjective Assessment Subjective: Pt worried about GI bleeding Patient and Family Stated Goals: go home Date of Onset:  (unknown) Prior Treatments: cleansing, Dakins soaked gauze, covered by sacral foam  Pain Score:  7/10  Wound Assessment  Wound 06/08/24 1104 Pressure Injury Coccyx Medial Stage 3 -  Full thickness tissue loss. Subcutaneous fat may be visible but bone, tendon or muscle are NOT exposed. (Active)  Wound Image   06/24/24 1400  Site / Wound Assessment Pink;Red;Yellow;Friable;Granulation tissue 06/30/24 1600  Peri-wound Assessment Erythema (blanchable);Maceration;Pink;Denuded 06/30/24 1600  Wound Length (cm) 6.1 cm 06/30/24 1600  Wound Width (cm) 3.7 cm 06/30/24 1600  Wound Surface Area (cm^2) 17.73 cm^2 06/30/24 1600  Wound Depth (cm) 3.1 cm 06/30/24 1600  Wound Volume (cm^3) 36.635 cm^3 06/30/24 1600  Drainage Description Serous;Purulent 06/30/24 1600  Drainage Amount Moderate 06/30/24 1600  Treatment Cleansed;Off loading;Irrigation 06/30/24 1600  Dressing Type ABD;Dakin's-soaked gauze;Barrier Film (skin prep) 06/30/24 1600  Dressing Changed Changed 06/29/24 1637  Dressing Status Clean, Dry, Intact 06/30/24 1600  State of Healing Early/partial granulation 06/30/24 1600  % Wound base Red or Granulating 30% 06/30/24 1600  % Wound base Yellow/Fibrinous Exudate 70% 06/30/24 1600  % Wound base Black/Eschar 0% 06/30/24 1600  % Wound base Other/Granulation Tissue (Comment) 0% 06/30/24 1600  Tunneling (cm) 1.5cm at  7:00 06/30/24 1600  Margins Unattached edges (unapproximated) 06/30/24 1600           Wound Assessment and Plan  Wound Therapy - Assess/Plan/Recommendations Wound Therapy - Clinical Statement: KI on L LE has been taken off and pt reports LE can be moved with  care, however increases time as patient only allows slowed movement. Will likely place KI for comfort with mobilization. Pt wound continues to have increased amount of tightly adherant slough to wound bed, which hopefully will respond to Santyl application. With removal of slough in the 5 to 7 oclock positions has reveled a tunnel at 7:00 Pt with continue to benefit from selective debridement for reduction of bioburden to promote wound bed healing. Wound Therapy - Functional Problem List: decrease mobility Factors Delaying/Impairing Wound Healing: Immobility, Multiple medical problems, Polypharmacy, Vascular compromise, Incontinence Hydrotherapy Plan: Debridement, Dressing change, Patient/family education Wound Therapy - Frequency: 2X / week Wound Therapy - Follow Up Recommendations: dressing changes by RN  Wound Therapy Goals- Improve the function of patient's integumentary system by progressing the wound(s) through the phases of wound healing (inflammation - proliferation - remodeling) by: Wound Therapy Goals - Improve the function of patient's integumentary system by progressing the wound(s) through the phases of wound healing by: Decrease Necrotic Tissue to: 50 Decrease Necrotic Tissue - Progress: Progressing toward goal Increase Granulation Tissue to: 50 Increase Granulation Tissue - Progress: Progressing toward goal Improve Drainage Characteristics: Min Improve Drainage Characteristics - Progress: Progressing toward goal Goals/treatment plan/discharge plan were made with and agreed upon by patient/family: Yes Time For Goal Achievement: 7 days Wound Therapy - Potential for Goals: Fair  Goals will be updated until maximal potential achieved or discharge criteria met.  Discharge criteria: when goals achieved, discharge from hospital, MD decision/surgical intervention, no progress towards goals, refusal/missing three consecutive treatments without notification or medical reason.  GP      Charges PT Wound Care Charges $Wound Debridement up to 20 cm: < or equal to 20 cm $PT Hydrotherapy  Dressing: 3 dressings $PT Hydrotherapy Visit: 1 Visit     Aryia Delira B. [name] PT, DPT Acute Rehabilitation Services Please use secure chat or  Call Office [phone]   Almarie KATHEE Fleeta [facility] 06/30/2024, 4:28 PM

## 2024-06-30 NOTE — Progress Notes (Addendum)
 PROGRESS NOTE        PATIENT DETAILS Name: Christina Castaneda Age: 68 y.o. Sex: female Date of Birth: 1956/07/02 Admit Date: 06/07/2024 Admitting Physician Orlin Fairly, MD ERE:Floopd, Mariano SQUIBB, DO  Brief Summary: Patient is a 68 y.o.  female with history of perforated sigmoid colon-s/p Hartman's/colostomy 2017-who initially presented to [facility] following a mechanical fall-she was found to have left distal femur fracture-underwent retrograde femoral nail on 6/20, unfortunately-further hospital course complicated by perforated duodenal ulcer with acute blood loss anemia resulting in hypovolemic shock-she was evaluated by general surgery and underwent emergent exploratory laparotomy with Graham's patch-postoperatively-she was transferred to [facility] ICU.  She was stabilized and then subsequently transferred to [facility] service.  Unfortunately-further hospital course complicated by recurrent melena through ostomy and acute blood loss anemia-after discussion with [facility] and then with interventional radiology-patient underwent on GDA embolization on 7/4.  See below for further details.   Significant events: 6/15>> mechanical fall-left femur fracture/AKI-admit to [facility] at [facility]. 6/20>> retrograde femoral nail by orthopedics at [facility] 6/23>> perforated duodenal ulcer-hypotension-exploratory laparotomy/placement of Arlyss supplies.  Remained intubated postprocedure-transferred to [facility] ICU. 6/24>> extubated 6/25>> off pressors-out of ICU-drop in hemoglobin but nonbloody ostomy output. 6/26>> transferred to [facility] service 7/02>> Black tarry stools in ostomy-Hb down to 6.1-General Surgery reconsulted-too early for EGD-CT angio negative for bleed-IR deferred embolization 7/03 >> continues to have black tarry stools-IR performed GDA embolization. 7/06>>  Hb 6.8-continues to have black stools in ostomy-1 unit of blood ordered. 7/07>> Hb 6.7-CT angiogram negative-1 unit of blood ordered. 7/08>> Hb 6.9-1 unit of  blood ordered.  Significant studies: 6/15>> CT left knee: Acute comminuted fracture of distal left femoral shaft. 6/16>> renal ultrasound: No hydronephrosis 6/23>> CT angiogram abdomen: Perforated and bleeding peptic ulcer disease affecting proximal duodenum-appears to be hematoma in the gastric antrum/proximal duodenum. 6/26>> upper GI series with KUB: No evidence of contrast leak. 7/02>> CT angio GI bleed study: No active bleeding.  Significant microbiology data: 6/23>> blood culture: No growth 6/23>> peritoneal fluid: Abundant Candida albicans/moderate Candida glabrata.  Procedures: 6/20>> retrograde femoral nail by orthopedics at [facility] 6/23>> exploratory laparotomy with omental Graham's patch repair 7/03>> GDA embolization.  Consults: GI at [facility] General Surgery at [facility] and [facility] IR  Subjective: Ostomy bag changed last night-currently no  with no output-but apparently was melanotic appearing last night.  Objective: Vitals: Blood pressure (!) 104/55, pulse 84, temperature 98.3 F (36.8 C), temperature source Oral, resp. rate 16, height 5' 5 (1.651 m), weight 114.6 kg, last menstrual period 11/18/2012, SpO2 100%.   Exam: Awake/alert   Pertinent Labs/Radiology:    Latest Ref Rng & Units 06/30/2024    5:00 AM 06/29/2024    4:00 PM 06/29/2024    4:12 AM  CBC  WBC 4.0 - 10.5 K/uL 8.1  9.7  7.5   Hemoglobin 12.0 - 15.0 g/dL 6.9  6.7  C 7.3   Hematocrit 36.0 - 46.0 % 20.9  20.2  22.4   Platelets 150 - 400 K/uL 208  214  197     C Corrected result    Lab Results  Component Value Date   NA 133 (L) 06/29/2024   K 3.8 06/29/2024   CL 100 06/29/2024   CO2 27 06/29/2024     Assessment/Plan: Hemorrhagic and septic shock secondary to perforated bleeding duodenal ulcer with acute blood loss anemia-s/p  exploratory laparotomy with Arlyss patch repair on 6/23 at [facility] Recurrent upper GI bleeding with acute blood loss anemia noted on 7/2-CTA negative for extravasation-s/p GDA  embolization by IR on 7/4. Had stabilized after laparotomy with Arlyss patch repair-however unfortunately developed upper GI bleed-with melanotic stool on 7/2-required multiple units of PRBC transfusion.  After discussion with general surgery-EGD not advised as patient had recent Midwife.  Subsequently-IR was consulted-patient underwent gastroduodenal artery embolization on 7/3.  Unfortunately-she continues to bleed/ melanotic stools-has had 3 consecutive days where she has required a blood transfusion.  Repeat CTA on 7/7 without any extravasation-question of whether she may have a gastric ulcer based on CTA.  Discussed with general surgery again on 7/8-unclear when EGD can be safely done in the setting-will await general surgical opinion-thankfully patient hemodynamically stable-being transfused 1 additional unit of PRBC today.  (total of 10 units this admission so far).  In the interim-will get GI opinion and have them on board in case patient continues to bleed/become hemodynamically unstable. Follow CBC-remains on IV PPI.  Mechanical fall with left femur fracture-s/p retrograde left femoral nail on 6/20 Per orthopedics-WBAT LLE, knee immobilizer when ambulating, remove staples in 2 weeks.  Follow-up with Dr. Oneil Salvage at [place] 2 weeks from discharge.  AKI Likely hemodynamically mediated Resolved  Acute urinary retention Foley catheter remains in place-will perform voiding trial in the next several days-if no further surgical procedures are planned.  HTN BP soft-continue to hold all antihypertensives  HLD Continue to hold Crestor -resume when able-currently n.p.o.  COPD Stable-no wheezing Continue bronchodilators  OSA CPAP nightly  Hypothyroidism Synthroid   History of perforated sigmoid colon-s/p Hartman/colostomy 2017  Debility/deconditioning PT/OT eval-SNF recommended.  Nutrition Status: Nutrition Problem: Increased nutrient needs Etiology: acute  illness, wound healing Signs/Symptoms: estimated needs Interventions: Refer to RD note for recommendations   Pressure Ulcer: Agree with assessment and plan as outlined below. Pressure Injury 05/27/24 Sacrum Stage 2 -  Partial thickness loss of dermis presenting as a shallow open injury with a red, pink wound bed without slough. 4 small stage II present, each measures 0.4x 0.4 x 0.2 (Active)  05/27/24 1815  Location: Sacrum  Location Orientation:   Staging: Stage 2 -  Partial thickness loss of dermis presenting as a shallow open injury with a red, pink wound bed without slough.  Wound Description (Comments): 4 small stage II present, each measures 0.4x 0.4 x 0.2  DO NOT USE:  Present on Admission:   Dressing Type Foam - Lift dressing to assess site every shift 06/29/24 1637    Morbid Obesity: Estimated body mass index is 42.04 kg/m as calculated from the following:   Height as of this encounter: 5' 5 (1.651 m).   Weight as of this encounter: 114.6 kg.   Code status:   Code Status: Full Code   DVT Prophylaxis: Place and maintain sequential compression device Start: 06/18/24 1325 SCDs Start: 06/15/24 1937 Place and maintain sequential compression device Start: 06/15/24 0155 SQ heparin  discontinued 7/2-due to high GI bleeding with ABLA.  Family Communication: Spouse Christina Castaneda-[phone]-updated 7/3   Disposition Plan: Status is: Inpatient Remains inpatient appropriate because: Severity of illness   Planned Discharge Destination:Skilled nursing facility   Diet: Diet Order             DIET SOFT Room service appropriate? Yes; Fluid consistency: Thin  Diet effective now                     Antimicrobial agents:  Anti-infectives (From admission, onward)    Start     Dose/Rate Route Frequency Ordered Stop   06/16/24 1800  vancomycin  (VANCOCIN ) IVPB 1000 mg/200 mL premix  Status:  Discontinued        1,000 mg 200 mL/hr over 60 Minutes Intravenous Every 24 hours  06/15/24 1317 06/15/24 1328   06/15/24 1700  vancomycin  (VANCOREADY) IVPB 2000 mg/400 mL  Status:  Discontinued        2,000 mg 200 mL/hr over 120 Minutes Intravenous  Once 06/15/24 1317 06/15/24 1328   06/15/24 1600  fluconazole  (DIFLUCAN ) IVPB 400 mg  Status:  Discontinued        400 mg 100 mL/hr over 120 Minutes Intravenous Every 24 hours 06/15/24 1317 06/16/24 0914   06/15/24 1500  metroNIDAZOLE  (FLAGYL ) IVPB 500 mg  Status:  Discontinued        500 mg 100 mL/hr over 60 Minutes Intravenous Every 12 hours 06/15/24 1216 06/15/24 1255   06/15/24 1400  ciprofloxacin  (CIPRO ) IVPB 400 mg  Status:  Discontinued        400 mg 200 mL/hr over 60 Minutes Intravenous Every 12 hours 06/15/24 1216 06/15/24 1255   06/15/24 1400  piperacillin -tazobactam (ZOSYN ) IVPB 3.375 g        3.375 g 12.5 mL/hr over 240 Minutes Intravenous Every 8 hours 06/15/24 1317 06/20/24 2359   06/15/24 1400  vancomycin  (VANCOCIN ) IVPB 1000 mg/200 mL premix  Status:  Discontinued        1,000 mg 200 mL/hr over 60 Minutes Intravenous Every 24 hours 06/15/24 1328 06/16/24 0914   06/15/24 1333  vancomycin  (VANCOCIN ) 1-5 GM/200ML-% IVPB       Note to Pharmacy: Romona Shaver E: cabinet override      06/15/24 1333 06/15/24 1410   06/15/24 1221  ciprofloxacin  (CIPRO ) 400 MG/200ML IVPB  Status:  Discontinued       Note to Pharmacy: Dannielle Railing S: cabinet override      06/15/24 1221 06/15/24 1343   06/15/24 1221  metroNIDAZOLE  (FLAGYL ) 500 MG/100ML IVPB  Status:  Discontinued       Note to Pharmacy: Dannielle Railing S: cabinet override      06/15/24 1221 06/15/24 1343   06/12/24 1223  clindamycin  (CLEOCIN ) 900 MG/50ML IVPB       Note to Pharmacy: Elaine Nest L: cabinet override      06/12/24 1223 06/12/24 1332   06/12/24 1131  ceFAZolin  (ANCEF ) 2-4 GM/100ML-% IVPB  Status:  Discontinued       Note to Pharmacy: Billy Sluder H: cabinet override      06/12/24 1131 06/12/24 1434   06/11/24 1300  clindamycin   (CLEOCIN ) IVPB 900 mg  Status:  Discontinued        900 mg 100 mL/hr over 30 Minutes Intravenous On call to O.R. 06/10/24 1041 06/10/24 1447   06/11/24 1300  ceFAZolin  (ANCEF ) IVPB 2g/100 mL premix        2 g 200 mL/hr over 30 Minutes Intravenous On call to O.R. 06/10/24 1447 06/12/24 1908        MEDICATIONS: Scheduled Meds:  arformoterol   15 mcg Nebulization Q12H   budesonide  (PULMICORT ) nebulizer solution  0.5 mg Nebulization BID   Chlorhexidine  Gluconate Cloth  6 each Topical Daily   feeding supplement  237 mL Oral BID BM   furosemide   20 mg Intravenous Once   levothyroxine   150 mcg Oral Q0600   multivitamin with minerals  1 tablet Oral Daily   nutrition supplement (JUVEN)  1  packet Oral BID AC & HS   nystatin    Topical BID   pantoprazole  (PROTONIX ) IV  40 mg Intravenous Q12H   sodium chloride  flush  10-40 mL Intracatheter Q12H   sodium chloride  flush  3 mL Intravenous Q12H   sodium hypochlorite   Irrigation BID   Continuous Infusions: PRN Meds:.dextrose , iohexol , ipratropium-albuterol , magic mouthwash w/lidocaine , morphine  injection, naLOXone  (NARCAN )  injection, [DISCONTINUED] ondansetron  **OR** ondansetron  (ZOFRAN ) IV, mouth rinse, oxyCODONE , polyethylene glycol, prochlorperazine , sodium chloride  flush, sodium chloride  flush   I have personally reviewed following labs and imaging studies  LABORATORY DATA: CBC: Recent Labs  Lab 06/28/24 0345 06/28/24 1648 06/29/24 0412 06/29/24 1600 06/30/24 0500  WBC 8.5 8.9 7.5 9.7 8.1  HGB 6.8* 7.9* 7.3* 6.7* 6.9*  HCT 21.1* 23.1* 22.4* 20.2* 20.9*  MCV 98.1 92.4 94.9 94.8 93.3  PLT 186 201 197 214 208    Basic Metabolic Panel: Recent Labs  Lab 06/24/24 1858 06/25/24 0355 06/26/24 0445 06/27/24 0428 06/29/24 0412  NA 133* 133* 136 134* 133*  K 4.1 3.8 3.6 3.6 3.8  CL 100 100 103 102 100  CO2 25 26 24 24 27   GLUCOSE 97 90 76 112* 82  BUN 49* 52* 40* 36* 18  CREATININE 1.10* 1.05* 1.18* 1.04* 0.87  CALCIUM  7.8*  8.2* 7.8* 8.1* 7.6*    GFR: Estimated Creatinine Clearance: 76 mL/min (by C-G formula based on SCr of 0.87 mg/dL).  Liver Function Tests: No results for input(s): AST, ALT, ALKPHOS, BILITOT, PROT, ALBUMIN in the last 168 hours.  No results for input(s): LIPASE, AMYLASE in the last 168 hours. No results for input(s): AMMONIA in the last 168 hours.  Coagulation Profile: Recent Labs  Lab 06/29/24 1538  INR 1.4*    Cardiac Enzymes: No results for input(s): CKTOTAL, CKMB, CKMBINDEX, TROPONINI in the last 168 hours.  BNP (last 3 results) No results for input(s): PROBNP in the last 8760 hours.  Lipid Profile: No results for input(s): CHOL, HDL, LDLCALC, TRIG, CHOLHDL, LDLDIRECT in the last 72 hours.   Thyroid  Function Tests: No results for input(s): TSH, T4TOTAL, FREET4, T3FREE, THYROIDAB in the last 72 hours.  Anemia Panel: No results for input(s): VITAMINB12, FOLATE, FERRITIN, TIBC, IRON, RETICCTPCT in the last 72 hours.  Urine analysis:    Component Value Date/Time   COLORURINE YELLOW 06/07/2024 2026   APPEARANCEUR HAZY (A) 06/07/2024 2026   LABSPEC 1.015 06/07/2024 2026   PHURINE 5.0 06/07/2024 2026   GLUCOSEU NEGATIVE 06/07/2024 2026   HGBUR NEGATIVE 06/07/2024 2026   BILIRUBINUR NEGATIVE 06/07/2024 2026   KETONESUR NEGATIVE 06/07/2024 2026   PROTEINUR NEGATIVE 06/07/2024 2026   NITRITE NEGATIVE 06/07/2024 2026   LEUKOCYTESUR NEGATIVE 06/07/2024 2026    Sepsis Labs: Lactic Acid, Venous    Component Value Date/Time   LATICACIDVEN 1.0 05/27/2024 1945    MICROBIOLOGY: No results found for this or any previous visit (from the past 240 hours).   RADIOLOGY STUDIES/RESULTS: CT ANGIO GI BLEED Result Date: 06/29/2024 CLINICAL DATA:  Upper GI bleed. EXAM: CTA ABDOMEN AND PELVIS WITHOUT AND WITH CONTRAST TECHNIQUE: Multidetector CT imaging of the abdomen and pelvis was performed using the standard protocol  during bolus administration of intravenous contrast. Multiplanar reconstructed images and MIPs were obtained and reviewed to evaluate the vascular anatomy. RADIATION DOSE REDUCTION: This exam was performed according to the departmental dose-optimization program which includes automated exposure control, adjustment of the mA and/or kV according to patient size and/or use of iterative reconstruction technique. CONTRAST:  100mL OMNIPAQUE   IOHEXOL  350 MG/ML SOLN COMPARISON:  CT dated 06/24/2024. FINDINGS: VASCULAR Aorta: Moderate atherosclerotic calcification. No aneurysmal dilatation or dissection. Celiac: The celiac artery, splenic, and left gastric artery branches are patent. Coil embolization of the gastroduodenal artery. SMA: The SMA is patent. Renals: The renal arteries are patent. Duplicated right renal artery anatomy. IMA: Diminished opacification of the IMA. Inflow: Mild atherosclerotic calcification. The iliac arteries are patent. No evidence of dilatation or dissection. Proximal Outflow: The visualized proximal outflow is patent. Veins: The IVC is unremarkable. The SMV, splenic vein, and main portal vein are patent. No portal venous gas. Review of the MIP images confirms the above findings. NON-VASCULAR Lower chest: Partially visualized moderate-sized bilateral pleural effusions with compressive atelectasis of the adjacent lungs. No intra-abdominal free air or free fluid. Hepatobiliary: Mild irregularity of the liver contour may represent early changes of cirrhosis. There is mild biliary dilatation, post cholecystectomy. No retained calcified stone noted in the central CBD. A drainage catheter noted inferior to the liver. No drainable fluid collection noted along the catheter. Pancreas: Unremarkable. No pancreatic ductal dilatation or surrounding inflammatory changes. Spleen: Normal in size without focal abnormality. Adrenals/Urinary Tract: The adrenal glands are unremarkable. There is an 8 mm nonobstructing  left renal upper pole calculus. Duplicated right renal collecting system. A 2.7 x 2.7 cm hypoenhancing area in the inferior pole of the right kidney is new since the prior CT may represent an area of infarct or a lobar nephronia/developing abscess. There is mild right hydronephrosis. The urinary bladder is decompressed around a Foley catheter. Stomach/Bowel: Retained oral contrast noted in the colon. 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 There is sigmoid diverticulosis. There is no bowel obstruction. Evaluation for active GI bleed is very limited due to presence of oral contrast. No extravasated contrast noted to suggest active GI bleed. There is inflammatory changes and thickening of the distal stomach which may represent gastritis or related to a gastric ulcer. Appendectomy. Lymphatic: No adenopathy. Reproductive: Hysterectomy. Other: Large ventral hernia containing portion of the stomach, loops of small bowel and part of colon. There is mild upper mesentery, and periportal edema. Musculoskeletal: Osteopenia with degenerative changes. No acute osseous pathology. IMPRESSION: 1. No evidence of active GI bleed. 2. Inflammatory changes and thickening of the distal stomach may represent gastritis or related to a gastric ulcer. Sigmoid diverticulosis. No bowel obstruction. 3. Partially visualized moderate-sized bilateral pleural effusions with compressive atelectasis of the adjacent lungs. 4. Large bowel containing ventral hernia.  No bowel obstruction. 5. A 2.7 x 2.7 cm hypoenhancing area in the inferior pole of the right kidney is new since the prior CT may represent an area of infarct or a lobar nephronia/developing abscess. There is mild right hydronephrosis. 6. An 8 mm nonobstructing left renal upper pole calculus. 7.  Aortic Atherosclerosis (ICD10-I70.0). Electronically Signed   By: Vanetta Chou M.D.   On: 06/29/2024 13:34      LOS: 23 days   Donalda Applebaum,  MD  [facility]    To contact the attending provider between 7A-7P or the covering provider during after hours 7P-7A, please log into the web site [email or web address] and access using universal [place] password for that web site. If you do not have the password, please call the hospital operator.  06/30/2024, 9:46 AM

## 2024-06-30 NOTE — Consult Note (Addendum)
 Consultation Note   Referring Provider:  [facility] PCP: [name], DO Primary Gastroenterologist:   Sampson ( saw [facility] during recent hospitalization)      Reason for Consultation:   DOA: 06/07/2024         Hospital Day: 17   ASSESSMENT    68 year old female with multiple medical problems not limited to asthma/COPD, hypertension, hypothyroidism, OSA , gout , perforated diverticulitis status post sigmoid colectomy with colostomy in 2017. Admitted for femur fracture but prolonged hospitalization complicated by respiratory failure, AKI and perforated duodenal ulcer.   Perforated duodenal ulcer, s/p lap with Arlyss patch repair and JP drain placement on 6/23.   for recurrent bleeding she underwent GDA embolization by IR on 7/4 . Having ongoing melena and transfusion requirements.    Repeat CTA yesterday without any extravasation but did show Inflammatory changes and thickening of the distal stomach possibly gastritis or gastric ulcer.   See PMH for additional history  Principal Problem:   Peptic ulcer perforation and hemorrhage (HCC) Active Problems:   Obesity   Hypothyroid   COPD  GOLD 2/AB   Tobacco abuse   Essential hypertension   Acute blood loss anemia   Generalized weakness   Chronic pain syndrome   OSA (obstructive sleep apnea)   Morbid obesity due to excess calories (HCC)   S/P colostomy (HCC)   Chronic venous insufficiency   Cigarette smoker   AKI (acute kidney injury) (HCC)   Sepsis (HCC)   Femur fracture, left (HCC)   Hemorrhagic shock (HCC)   Leukocytosis   Thrombocytopenia (HCC)   Duodenal ulcer   Intra-abdominal adhesions      PLAN:   --Patient is at very high risk for upper endoscopy, especially given her recent Graham patch repair of perforated duodenal ulcer.  Additionally she has several co-morbid conditions increasing risk for sedation.  This was all discussed extensively with  the patient.  She was not willing to give consent until we spoke with her husband.  We will reach out to husband today.  --Continue BID pantoprazole .  --Trend H/H. She got a 10th unit of RBCs today. Hgb now 7.5.   HPI   Remote history Christina Castaneda has a complicated GI history. In April 2017 she had a Hartman's pouch procedure in [place]  ( ? Reason) and presented to this hospital with stool drainage from her wound.  During that prolonged admission she underwent multiple surgeries including laparotomy, colostomy removal and end stapling of stoma, partial colectomy, omentectomy, debridement of wound, left colectomy and end colostomy then re-exploration of open abdomen and application of wound vac.   Interval history  Last month patient was admitted to [facility] after a fall at home resulting in a left femur fracture for which she underwent surgery.  Hospitalization was complicated by AKI, respiratory failure and then perforated gastric ulcer. On 6/23 she was seen by [facility] for an upper GI bleed requiring resuscitation with pressors and blood transfusions .  CT angiography concerning for   perforated and bleeding peptic ulcer and pneumoperitoneum within the upper abdomen and within the ventral hernia.  Additionally there was a large ventral abdominal hernia containing the gastric body, a portion of the ascending colon and multiple  loops of small bowel as well as a moderate volume of free air. Patient was seen by Surgery. She underwent exploratory laparotomy, omental Arlyss patch repair of the duodenal ulcer, lysis of adhesions  on 6/23. Subsequently transferred to [facility] ICU. She continued to bleeding and subsequently underwent GDA embolization by IR on 7/4. She has continued to bleed with black tarry stool in ostomy and ongoing need for transfusion.  Repeat CTA yesterday without any extravasation but-did show Inflammatory changes and thickening of the distal stomach which represent gastritis or  related to a gastric ulcer. We were asked to see for possible EGD  Labs and Imaging:  Recent Labs    06/29/24 1600 06/30/24 0500 06/30/24 1221  WBC 9.7 8.1 8.5  HGB 6.7* 6.9* 7.5*  HCT 20.2* 20.9* 22.3*  MCV 94.8 93.3 92.9  PLT 214 208 198   Recent Labs    06/29/24 0412  NA 133*  K 3.8  CL 100  CO2 27  GLUCOSE 82  BUN 18  CREATININE 0.87  CALCIUM  7.6*     CT ANGIO GI BLEED CLINICAL DATA:  Upper GI bleed.  EXAM: CTA ABDOMEN AND PELVIS WITHOUT AND WITH CONTRAST  TECHNIQUE: Multidetector CT imaging of the abdomen and pelvis was performed using the standard protocol during bolus administration of intravenous contrast. Multiplanar reconstructed images and MIPs were obtained and reviewed to evaluate the vascular anatomy.  RADIATION DOSE REDUCTION: This exam was performed according to the departmental dose-optimization program which includes automated exposure control, adjustment of the mA and/or kV according to patient size and/or use of iterative reconstruction technique.  CONTRAST:  OMNIPAQUE  IOHEXOL  350 MG/ML SOLN  COMPARISON:  CT dated 06/24/2024.  FINDINGS: VASCULAR  Aorta: Moderate atherosclerotic calcification. No aneurysmal dilatation or dissection.  Celiac: The celiac artery, splenic, and left gastric artery branches are patent. Coil embolization of the gastroduodenal artery.  SMA: The SMA is patent.  Renals: The renal arteries are patent. Duplicated right renal artery anatomy.  IMA: Diminished opacification of the IMA.  Inflow: Mild atherosclerotic calcification. The iliac arteries are patent. No evidence of dilatation or dissection.  Proximal Outflow: The visualized proximal outflow is patent.  Veins: The IVC is unremarkable. The SMV, splenic vein, and main portal vein are patent. No portal venous gas.  Review of the MIP images confirms the above findings.  NON-VASCULAR  Lower chest: Partially visualized moderate-sized bilateral  pleural effusions with compressive atelectasis of the adjacent lungs.  No intra-abdominal free air or free fluid.  Hepatobiliary: Mild irregularity of the liver contour may represent early changes of cirrhosis. There is mild biliary dilatation, post cholecystectomy. No retained calcified stone noted in the central CBD. A drainage catheter noted inferior to the liver. No drainable fluid collection noted along the catheter.  Pancreas: Unremarkable. No pancreatic ductal dilatation or surrounding inflammatory changes.  Spleen: Normal in size without focal abnormality.  Adrenals/Urinary Tract: The adrenal glands are unremarkable. There is an 8 mm nonobstructing left renal upper pole calculus. Duplicated right renal collecting system. A 2.7 x 2.7 cm hypoenhancing area in the inferior pole of the right kidney is new since the prior CT may represent an area of infarct or a lobar nephronia/developing abscess. There is mild right hydronephrosis. The urinary bladder is decompressed around a Foley catheter.  Stomach/Bowel: Retained oral contrast noted in the colon. 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 There is sigmoid diverticulosis. There is no bowel obstruction. Evaluation for active GI bleed is very limited  due to presence of oral contrast. No extravasated contrast noted to suggest active GI bleed. There is inflammatory changes and thickening of the distal stomach which may represent gastritis or related to a gastric ulcer. Appendectomy.  Lymphatic: No adenopathy.  Reproductive: Hysterectomy.  Other: Large ventral hernia containing portion of the stomach, loops of small bowel and part of colon. There is mild upper mesentery, and periportal edema.  Musculoskeletal: Osteopenia with degenerative changes. No acute osseous pathology.  IMPRESSION: 1. No evidence of active GI bleed. 2. Inflammatory changes and thickening of the distal stomach  may represent gastritis or related to a gastric ulcer. Sigmoid diverticulosis. No bowel obstruction. 3. Partially visualized moderate-sized bilateral pleural effusions with compressive atelectasis of the adjacent lungs. 4. Large bowel containing ventral hernia.  No bowel obstruction. 5. A 2.7 x 2.7 cm hypoenhancing area in the inferior pole of the right kidney is new since the prior CT may represent an area of infarct or a lobar nephronia/developing abscess. There is mild right hydronephrosis. 6. An 8 mm nonobstructing left renal upper pole calculus. 7.  Aortic Atherosclerosis (ICD10-I70.0).  Electronically Signed   By: Vanetta Chou M.D.   On: 06/29/2024 13:34 IR EMBO ART  VEN HEMORR LYMPH EXTRAV  INC GUIDE ROADMAPPING INDICATION: Refractory upper GI bleed s/p endoscopy.  EXAM: Title; COIL EMBOLIZATION OF GASTRODUODENAL ARTERY  Listed procedures;  1.  ULTRASOUND GUIDANCE FOR ARTERIAL ACCESS  2. MESENTERIC ARTERIOGRAPHY, INCLUDING CELIAC, COMMON HEPATIC and GASTRODUODENAL ARTERIOGRAMS  3.  COIL EMBOLIZATION OF GASTRODUODENAL ARTERY  COMPARISON:  CTA abdomen pelvis, 06/24/2024  MEDICATIONS: 4 mg Zofran  IV.  50 mg Benadryl  IV.  ANESTHESIA/SEDATION: Moderate (conscious) sedation was employed during this procedure. A total of Versed  1.5 mg and Fentanyl  75 mcg was administered intravenously.  Moderate Sedation Time: 89 minutes. The patient's level of consciousness and vital signs were monitored continuously by radiology nursing throughout the procedure under my direct supervision.  CONTRAST:  100 mL Omnipaque  300  FLUOROSCOPY: Radiation Exposure Index and estimated peak skin dose (PSD);  Reference air kerma (RAK), 448.5 mGy. Kerma-area product (KAP), 7499.3 uGy*m.  COMPLICATIONS: None immediate.  PROCEDURE: Informed consent was obtained from the patient and/or patient's representative following explanation of the procedure, risks, benefits and alternatives.  All questions were addressed. A time out was performed prior to the initiation of the procedure. Maximal barrier sterile technique utilized including caps, mask, sterile gowns, sterile gloves, large sterile drape, hand hygiene, and chlorhexidine  prep.  The RIGHT femoral head was marked fluoroscopically. Under sterile conditions and local anesthesia, the RIGHT common femoral artery access was performed with a micropuncture needle. Under direct ultrasound guidance, the RIGHT common femoral was accessed with a micropuncture kit. An ultrasound image was saved for documentation purposes. This allowed for placement of a 5 Fr 35 cm vascular sheath. A limited arteriogram was performed through the side arm of the sheath confirming appropriate access within the RIGHT common femoral artery.  A limited abdominal aortogram was performed to help identify the celiac artery origin. Over a Bentson wire, a C2 catheter was advanced, back bled and flushed. The catheter was then utilized to select the celiac access, then advanced into the common hepatic then gastroduodenal arteries. Selective mesenteric arteriograms were performed at each level.  Initial coil deployment was performed with the 5 Fr catheter within the gastroduodenal artery, then a 6 mm 0.035 inch Terumo Azure was attempted to be deployed, however due deployment mechanism fraying was noted on reposition attempt. The 5 Fr sheath was exchanged  for an 8 Fr 35 cm vascular sheath, then a 6 Fr snare was deployed. The frayed deployment mechanism was ensnared, and the mechanism and coil were removed intact.  The C2 catheter was readvanced and utilized to select the celiac axis, and finally back into the GDA. Using a 2.4 Fr microcatheter and 0.016 inch Fathom microwire access into the gastroduodenal artery was performed and a selective arteriogram was performed.  Selective embolization with multiple 0.018 inch micro coils was performed. The  microcatheter was removed and arteriogram with the 5 Fr catheter at the common hepatic artery was performed. Adequate pruning of the GDA feeder arteries was achieved on post embolization arteriogram.  Images were reviewed and the procedure was terminated. All wires, catheters and sheaths were removed from the patient. Hemostasis was achieved at the RIGHT groin access site with Angio-Seal closure device.  The patient tolerated the procedure well without immediate post procedural complication.  FINDINGS: *Celiac and common hepatic arteriograms with normal order and branching. *Gastroduodenal arteriogram without active extravasation noted. *Embolization with adequate pruning of the GDA distribution arteries. *Access via the RIGHT femoral artery. Angio-Seal closure at RIGHT groin. Palpable RLE pulses at the end of the Case  IMPRESSION: 1. 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  2. Successful empiric coil embolization of the gastroduodenal artery for refractory upper GI bleed.  PLAN: - The patient is to remain flat for 2 hours with RIGHT leg straight.  - The patient may continue to experience residual melena however should resolve in the coming days.  Thom Hall, MD  [facility]  [facility]  Electronically Signed   By: Thom Hall M.D.   On: 06/26/2024 10:26   Past Medical History:  Diagnosis Date   Asthma    mild   Atypical endometrial hyperplasia    COPD (chronic obstructive pulmonary disease) (HCC)    Goiter    Gout    Heart murmur    Hypertension    Hypothyroidism    Vitamin B12 deficiency    Yeast infection    for last 3 weeks    Past Surgical History:  Procedure Laterality Date   ABDOMINAL WALL DEFECT REPAIR N/A 04/18/2016   Procedure: CLOSURE OF ABDOMEN;  Surgeon: Debby Shipper, MD;  Location: [facility] OR;  Service: General;  Laterality: N/A;    APPENDECTOMY     APPLICATION OF WOUND VAC N/A 04/02/2016   Procedure: application of wound vac+;  Surgeon: Camellia Blush, MD;  Location: [facility] OR;  Service: General;  Laterality: N/A;   CHOLECYSTECTOMY  2009   gallstone removed   COLOSTOMY N/A 03/30/2016   Procedure: COLOSTOMY;  Surgeon: Donnice Bury, MD;  Location: [facility] OR;  Service: General;  Laterality: N/A;   COLOSTOMY REVISION N/A 03/28/2016   Procedure: COLOSTOMY REVISION;  Surgeon: Lynwood Pina, MD;  Location: [facility] OR;  Service: General;  Laterality: N/A;   COLOSTOMY REVISION N/A 03/30/2016   Procedure: COLON RESECTION LEFT;  Surgeon: Donnice Bury, MD;  Location: [facility] OR;  Service: General;  Laterality: N/A;   FEMUR IM NAIL Left 06/12/2024   Procedure: INSERTION, INTRAMEDULLARY ROD, FEMUR, RETROGRADE;  Surgeon: Onesimo Oneil LABOR, MD;  Location: [facility] ORS;  Service: Orthopedics;  Laterality: Left;   IR ANGIOGRAM VISCERAL SELECTIVE  06/25/2024   IR ANGIOGRAM VISCERAL SELECTIVE  06/25/2024   IR ANGIOGRAM VISCERAL SELECTIVE  06/25/2024   IR EMBO ART  VEN HEMORR LYMPH EXTRAV  INC GUIDE ROADMAPPING  06/25/2024   IR US  GUIDE  VASC ACCESS RIGHT  06/25/2024   LAPAROTOMY N/A 03/28/2016   Procedure: EXPLORATORY LAPAROTOMY;  Surgeon: Lynwood Pina, MD;  Location: [facility] OR;  Service: General;  Laterality: N/A;   LAPAROTOMY N/A 04/02/2016   Procedure: Re-exploration of open abdomen, application of abdominal wound vac;  Surgeon: Camellia Blush, MD;  Location: [facility] OR;  Service: General;  Laterality: N/A;   LAPAROTOMY N/A 04/04/2016   Procedure: EXPLORATORY LAPAROTOMY, PLACEMENT OF ABRA ABDOMINAL WALL CLOSURE SET ;  Surgeon: Camellia Blush, MD;  Location: [facility] OR;  Service: General;  Laterality: N/A;   LAPAROTOMY N/A 04/18/2016   Procedure: EXPLORATORY LAPAROTOMY;  Surgeon: Debby Shipper, MD;  Location: [facility] OR;  Service: General;  Laterality: N/A;   LAPAROTOMY N/A 06/15/2024   Procedure: LAPAROTOMY, EXPLORATORY OMENTAL GRAHAM PATCH REPAIR.;  Surgeon: Evonnie Dorothyann LABOR, DO;  Location: [facility]  ORS;  Service: General;  Laterality: N/A;   LYSIS OF ADHESION N/A 06/15/2024   Procedure: LAPAROTOMY, FOR LYSIS OF ADHESIONS;  Surgeon: Evonnie Dorothyann LABOR, DO;  Location: [facility] ORS;  Service: General;  Laterality: N/A;   NECK SURGERY  1994   repair disk   OMENTECTOMY N/A 03/28/2016   Procedure: OMENTECTOMY;  Surgeon: Lynwood Pina, MD;  Location: [facility] OR;  Service: General;  Laterality: N/A;   ROBOTIC ASSISTED TOTAL HYSTERECTOMY WITH BILATERAL SALPINGO OOPHERECTOMY  11/18/2012   Procedure: ROBOTIC ASSISTED TOTAL HYSTERECTOMY WITH BILATERAL SALPINGO OOPHORECTOMY;  Surgeon: Elenore A. Dodie, MD;  Location: [facility] ORS;  Service: Gynecology;  Laterality: N/A;   TONSILLECTOMY     as a child   VACUUM ASSISTED CLOSURE CHANGE N/A 03/30/2016   Procedure: ABDOMINAL VAC CHANGE;  Surgeon: Donnice Bury, MD;  Location: [facility] OR;  Service: General;  Laterality: N/A;   WOUND DEBRIDEMENT N/A 03/28/2016   Procedure: DEBRIDEMENT WOUND;  Surgeon: Lynwood Pina, MD;  Location: [facility] OR;  Service: General;  Laterality: N/A;    Family History  Problem Relation Age of Onset   COPD Mother    Diabetes Mother    Congestive Heart Failure Mother    Diabetes Father    Heart attack Father    Colon cancer Neg Hx    Colon polyps Neg Hx     Prior to Admission medications   Medication Sig Start Date End Date Taking? Authorizing Provider  acetaminophen  (TYLENOL ) 500 MG tablet Take 1,000 mg by mouth every 6 (six) hours.   Yes [provider]  albuterol  (VENTOLIN  HFA) 108 (90 Base) MCG/ACT inhaler Inhale 2 puffs into the lungs every 6 (six) hours as needed. Wheezing and shortness of breath 05/31/24 07/30/24 Yes Shahmehdi, Seyed A, MD  allopurinol  (ZYLOPRIM ) 300 MG tablet Take 300 mg by mouth daily.   Yes [provider]  Cyanocobalamin (VITAMIN B-12 CR PO) Take 1 tablet by mouth daily.   Yes [provider]  Ergocalciferol  50 MCG (2000 UT) TABS Take 2,000 Units by mouth daily.   Yes [provider]   fluticasone -salmeterol (ADVAIR) 500-50 MCG/ACT AEPB Inhale 1 puff into the lungs in the morning and at bedtime. 05/31/24 06/30/24 Yes Shahmehdi, Seyed A, MD  gabapentin  (NEURONTIN ) 300 MG capsule Take 1-2 capsules by mouth at bedtime. 02/27/21  Yes [provider]  levothyroxine  (SYNTHROID ) 137 MCG tablet Take 137 mcg by mouth daily.   Yes [provider]  nystatin  (MYCOSTATIN /NYSTOP ) powder Apply topically 3 (three) times daily. 05/31/24  Yes Shahmehdi, Seyed A, MD  rosuvastatin  (CRESTOR ) 10 MG tablet Take 10 mg by mouth daily.   Yes [provider]  senna-docusate (  SENOKOT-S) 8.6-50 MG tablet Take 2 tablets by mouth at bedtime. 05/31/24 06/30/24 Yes Shahmehdi, Adriana LABOR, MD    Current Facility-Administered Medications  Medication Dose Route Frequency Provider Last Rate Last Admin   arformoterol  (BROVANA ) nebulizer solution 15 mcg  15 mcg Nebulization Q12H Payne, John D, PA-C   15 mcg at 06/30/24 0805   budesonide  (PULMICORT ) nebulizer solution 0.5 mg  0.5 mg Nebulization BID Payne, John D, PA-C   0.5 mg at 06/30/24 9194   Chlorhexidine  Gluconate Cloth 2 % PADS 6 each  6 each Topical Daily Onesimo Oneil LABOR, MD   6 each at 06/30/24 0815   dextrose  50 % solution 50 mL  50 mL Intravenous PRN Shona Laurence N, DO   50 mL at 06/18/24 0441   feeding supplement (ENSURE PLUS HIGH PROTEIN) liquid 237 mL  237 mL Oral BID BM Elgergawy, Dawood S, MD   237 mL at 06/30/24 1257   iohexol  (OMNIPAQUE ) 300 MG/ML solution 100 mL  100 mL Per Tube Once PRN Tammy Sor, PA-C   100 mL at 06/18/24 1012   ipratropium-albuterol  (DUONEB) 0.5-2.5 (3) MG/3ML nebulizer solution 3 mL  3 mL Nebulization Q4H PRN Payne, John D, PA-C   3 mL at 06/28/24 1457   levothyroxine  (SYNTHROID ) tablet 150 mcg  150 mcg Oral Q0600 Elgergawy, Dawood S, MD   150 mcg at 06/30/24 0643   magic mouthwash w/lidocaine   15 mL Oral QID PRN Onesimo Oneil LABOR, MD       morphine  (PF) 2 MG/ML injection 1 mg  1 mg Intravenous Q4H PRN Raenelle Donalda HERO, MD   1 mg at 06/30/24 1104   multivitamin with minerals tablet 1 tablet  1 tablet Oral Daily Elgergawy, Dawood S, MD   1 tablet at 06/30/24 0815   naloxone  (NARCAN ) injection 0.4 mg  0.4 mg Intravenous PRN Rathore, Vasundhra, MD       nutrition supplement (JUVEN) (JUVEN) powder packet 1 packet  1 packet Oral BID AC & HS Elgergawy, Dawood S, MD   1 packet at 06/29/24 2303   nystatin  (MYCOSTATIN /NYSTOP ) topical powder   Topical BID Onesimo Oneil LABOR, MD   Given at 06/30/24 0815   ondansetron  (ZOFRAN ) injection 4 mg  4 mg Intravenous Q6H PRN Onesimo Oneil LABOR, MD   4 mg at 06/26/24 2100   Oral care mouth rinse  15 mL Mouth Rinse PRN Onesimo Oneil LABOR, MD       oxyCODONE  (Oxy IR/ROXICODONE ) immediate release tablet 5 mg  5 mg Oral Q4H PRN Elgergawy, Dawood S, MD   5 mg at 06/30/24 0854   pantoprazole  (PROTONIX ) injection 40 mg  40 mg Intravenous Q12H Raenelle Donalda HERO, MD   40 mg at 06/30/24 0816   polyethylene glycol (MIRALAX  / GLYCOLAX ) packet 17 g  17 g Oral Daily PRN Shona Laurence N, DO       prochlorperazine  (COMPAZINE ) injection 10 mg  10 mg Intravenous Q6H PRN Segars, Jonathan, MD   10 mg at 06/26/24 1512   sodium chloride  flush (NS) 0.9 % injection 10-40 mL  10-40 mL Intracatheter Q12H Elgergawy, Brayton RAMAN, MD   10 mL at 06/30/24 0816   sodium chloride  flush (NS) 0.9 % injection 10-40 mL  10-40 mL Intracatheter PRN Elgergawy, Brayton RAMAN, MD       sodium chloride  flush (NS) 0.9 % injection 3 mL  3 mL Intravenous Q12H Johnson, Clanford L, MD   3 mL at 06/30/24 0816   sodium chloride  flush (NS)  0.9 % injection 3 mL  3 mL Intravenous PRN Johnson, Clanford L, MD       sodium hypochlorite (DAKIN'S 1/4 STRENGTH) topical solution   Irrigation BID Elgergawy, Brayton RAMAN, MD   Given at 06/30/24 [phone]    Allergies as of 06/07/2024 - Review Complete 06/07/2024  Allergen Reaction Noted   Penicillins Anaphylaxis, Hives, Shortness Of Breath, and Swelling 11/12/2012   Biphosphate Other (See Comments) 05/27/2024    Fluticasone  Other (See Comments) 05/27/2024    Social History   Socioeconomic History   Marital status: Married    Spouse name: Not on file   Number of children: Not on file   Years of education: Not on file   Highest education level: Not on file  Occupational History   Not on file  Tobacco Use   Smoking status: Every Day    Current packs/day: 1.50    Average packs/day: 1.5 packs/day for 28.0 years (42.0 ttl pk-yrs)    Types: Cigarettes   Smokeless tobacco: Never  Vaping Use   Vaping status: Never Used  Substance and Sexual Activity   Alcohol use: Yes    Alcohol/week: 1.0 - 3.0 standard drink of alcohol    Types: 1 - 3 Standard drinks or equivalent per week    Comment: 1-2 beers a week.   Drug use: No   Sexual activity: Yes    Partners: Male    Birth control/protection: Surgical    Comment: Hysterectomy  Other Topics Concern   Not on file  Social History Narrative   Not on file   Social Drivers of Health   Financial Resource Strain: Not on file  Food Insecurity: No Food Insecurity (06/08/2024)   Hunger Vital Sign    Worried About Running Out of Food in the Last Year: Never true    Ran Out of Food in the Last Year: Never true  Transportation Needs: No Transportation Needs (06/08/2024)   PRAPARE - Administrator, Civil Service (Medical): No    Lack of Transportation (Non-Medical): No  Physical Activity: Not on file  Stress: Not on file  Social Connections: Unknown (06/08/2024)   Social Connection and Isolation Panel    Frequency of Communication with Friends and Family: Not on file    Frequency of Social Gatherings with Friends and Family: Once a week    Attends Religious Services: 1 to 4 times per year    Active Member of [employer] or Organizations: No    Attends Banker Meetings: Never    Marital Status: Married  Recent Concern: Social Connections - Moderately Isolated (05/27/2024)   Social Connection and Isolation Panel    Frequency of  Communication with Friends and Family: Once a week    Frequency of Social Gatherings with Friends and Family: Once a week    Attends Religious Services: 1 to 4 times per year    Active Member of [employer] or Organizations: No    Attends Banker Meetings: Never    Marital Status: Married  Catering manager Violence: Not At Risk (06/08/2024)   Humiliation, Afraid, Rape, and Kick questionnaire    Fear of Current or Ex-Partner: No    Emotionally Abused: No    Physically Abused: No    Sexually Abused: No     Code Status   Code Status: Full Code  Review of Systems: All systems reviewed and negative except where noted in HPI.  Physical Exam: Vital signs in last 24 hours: Temp:  [97.9  F (36.6 C)-98.6 F (37 C)] 98.5 F (36.9 C) (07/08 1110) Pulse Rate:  [58-87] 87 (07/08 1230) Resp:  [12-18] 14 (07/08 1110) BP: (97-113)/(53-86) 106/60 (07/08 1230) SpO2:  [94 %-100 %] 96 % (07/08 1110) FiO2 (%):  [32 %] 32 % (07/07 2010) Weight:  [114.6 kg] 114.6 kg (07/08 0500) Last BM Date : 06/30/24  General:  Morbidly obese female in NAD.  Psych:  Somewhat agitated Eyes: Pupils equal Ears:  Normal auditory acuity Nose: No deformity, discharge or lesions Neck:  Supple, no masses felt Lungs:  Anterior chest congested breath sounds. On 3 L 02 per [place] [facility]:  Regular rate, regular rhythm.  Abdomen:  LIMITED EXAM. Patient lying on side, didn't want to be repositioned for exam. Has ostomy with moderate amount of dark liquid stool in bag. Surgical drain in upper abdomen with a small amount of light yellow fluid in bulb.  Rectal :  Deferred Neurologic:  Alert, oriented, grossly normal neurologically Extremities : 1-2+ BLE edema    Intake/Output from previous day: 07/07 0701 - 07/08 0700 In: 310 [P.O.:300; I.V.:10] Out: 1340 [Urine:1250; Drains:90] Intake/Output this shift:  Total I/O In: 567.6 [P.O.:240; I.V.:13; Blood:314.6] Out: -    Vina Dasen, NP-C   06/30/2024, 1:49  PM  I have taken an interval history, thoroughly reviewed the chart and examined the patient.  (I saw this patient along with my APP) therefore, I agree with the Advanced Practitioner's note, impression and recommendations, and have recorded additional findings, impressions and recommendations below. I performed a substantive portion of this encounter (>50% time spent), including a complete performance of the medical decision making.  My additional thoughts are as follows:  Complex patient with ongoing GI bleeding after recent patch repair for perforated DU and subsequent IR embolization of GDA last week.  No active extravasation on CTA yesterday, but this patient clearly has melena in her right sided colostomy bag and ongoing transfusion requirements.  So she is still bleeding, and most likely from the upper GI tract but probably just not at a rate that is detectable on CTA. The CTAs from this admission both suggest there may be thickening or some other mucosal irregularity in the distal stomach, so perhaps there is an additional ulcer or ulcers in that area.  Further surgery at this juncture would clearly be greater risk than expected benefits since the source of bleeding is not certain, and it would not appear that interventional radiology has more to offer at this point in the way of additional embolization of other vasculature (they were contacted by the internal medicine and surgical service with this question earlier today).  Therefore, an upper endoscopy appears to be the only option, albeit one that comes with significantly increased risks of perforation given the recent surgery as well as perforation and cardiopulmonary effects of anesthesia.  She is very ill in poor condition, not able to take good lung volumes while bedbound, and she has bronchial breath sounds.  I explained all of this to her in great detail as best I could, though she was at times reluctant to engage in the conversation.   It appears she is both anxious and overwhelmed by all of this.  She said she understood the bleeding is still going on, but she is frustrated and cannot understand why the source of this was not discovered with previous procedures.  I did my best to explain the complexity of this and that I think all appropriate measures have been taken so  far, but she remained unconvinced.  At the end of our conversation, she seemed as if she may be agreeable to an upper endoscopy tomorrow, but wanted us  to speak with her husband to give them more information in hopes that he could at least call here if not come by the hospital later today and help her make a decision. So after consultation I spoke with her husband Sheena on the phone, and he was currently out of town in [place]  as a IT trainer but expected he might be back to this area this evening.  He understood everything I explained to him, questions were answered, and he felt there seemed no other option but to do the upper endoscopy for the bleeding.  He was planning to call his wife later today.  So we will return tomorrow morning to see this patient and get a final answer on whether or not she wishes to proceed with an EGD tomorrow.  We have made tentative plans to do so by holding sometime in the afternoon and we will proceed accordingly based on what we hear tomorrow.  (Complex patient and high degree of medical decision making)  Victory LITTIE Brand III [phone]  Addendum 07/01/2019 5:25 PM  Further communications have occurred among the internal medicine, GI and general surgery service.  Dr. Ann of the surgical service is strongly recommending against the upper endoscopy tomorrow because the patient is hemodynamically stable and she is greatly concerned about potential perforation and the implications of that should it occur.   A TEG panel was ordered this morning to evaluate aspects of this patient's hemostatic function, and based on that Dr. Ann  feels that cryoprecipitate should be administered and she is ordering it.  We appreciate the input, we will cancel the plans for upper endoscopy tomorrow and hope that with further supportive care with blood products and acid suppression that the bleeding may slow or stop.   GLENWOOD Victory Brand, MD    [facility]

## 2024-06-30 NOTE — Plan of Care (Signed)
  Problem: Education: Goal: Knowledge of General Education information will improve Description: Including pain rating scale, medication(s)/side effects and non-pharmacologic comfort measures Outcome: Progressing   Problem: Clinical Measurements: Goal: Respiratory complications will improve Outcome: Progressing Goal: Cardiovascular complication will be avoided Outcome: Progressing   Problem: Nutrition: Goal: Adequate nutrition will be maintained Outcome: Progressing   Problem: Coping: Goal: Level of anxiety will decrease Outcome: Progressing   Problem: Elimination: Goal: Will not experience complications related to bowel motility Outcome: Progressing Goal: Will not experience complications related to urinary retention Outcome: Progressing   Problem: Pain Managment: Goal: General experience of comfort will improve and/or be controlled Outcome: Progressing   Problem: Safety: Goal: Ability to remain free from injury will improve Outcome: Progressing   Problem: Health Behavior/Discharge Planning: Goal: Ability to manage health-related needs will improve Outcome: Not Progressing   Problem: Activity: Goal: Risk for activity intolerance will decrease Outcome: Not Progressing

## 2024-06-30 NOTE — Progress Notes (Signed)
 Occupational Therapy Treatment Patient Details Name: Christina Castaneda MRN: [id number] DOB: 12/17/1956 Today's Date: 06/30/2024   History of present illness Pt is a 68 y/o F presenting to [facility] ED on 6/15 after fall, found to have L femoral neck fx and AKI. S/p retrograde IMN on 6/20. Hgb drop on 6/23 with dark ostomy output, transfused, subsequent hypotension requiring pressors. Found to have perforated bleeding duodenal ulcer s/p ex lap on 6/23. Transferred to [facility] for PCCM. 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   OT comments  Pt with minimal progress towards goals due to reported pain, anxiety and self limiting behaviors. Pt declined ADL retraining, declined HOB elevation/light core strengthening tasks and minimally participatory in LE ROM. Pt with KI off in bed, resting LLE in external rotation posing risk for contractures due to immobility. Attempted to assist in extending LLE though pt declined to allow OT to continue. Recommend continued inpatient follow up therapy, <3 hours/day at DC.      If plan is discharge home, recommend the following:  Two people to help with walking and/or transfers;Two people to help with bathing/dressing/bathroom;Assistance with cooking/housework;Assist for transportation;Help with stairs or ramp for entrance   Equipment Recommendations  Hoyer lift;Hospital bed;Wheelchair cushion (measurements OT);Wheelchair (measurements OT)    Recommendations for Other Services      Precautions / Restrictions Precautions Precautions: Fall;Other (comment) Recall of Precautions/Restrictions: Intact Precaution/Restrictions Comments: JP drain, colostomy, sacral wound Required Braces or Orthoses: Knee Immobilizer - Left Knee Immobilizer - Left: On when out of bed or walking Restrictions Weight Bearing Restrictions Per Provider Order: Yes LLE Weight Bearing Per Provider Order: Weight bearing as tolerated Other  Position/Activity Restrictions: No L knee ROM restrictions       Mobility Bed Mobility               General bed mobility comments: pt declined    Transfers                   General transfer comment: declined     Balance                                           ADL either performed or assessed with clinical judgement   ADL Overall ADL's : Needs assistance/impaired                                       General ADL Comments: Declined ADL retraining, minimally participatory in attempts to stretch LLE (reported likely panic attack, fearful of pain, wanted to attempt herself but unable and declined OT assist). Educated on long sitting in bed with bedrails, raising HOB but pt declined - reports she does these things at night when no one is around. Inquired about pt's reported preference for AM therapy treatments with pt reports still preferring AM despite noted reluctance to participate today    Extremity/Trunk Assessment Upper Extremity Assessment Upper Extremity Assessment: Generalized weakness;Right hand dominant   Lower Extremity Assessment Lower Extremity Assessment: Defer to PT evaluation        Vision   Vision Assessment?: No apparent visual deficits   Perception     Praxis     Communication Communication Communication: No apparent difficulties   Cognition Arousal: Alert Behavior During Therapy: Flat  affect, WFL for tasks assessed/performed Cognition: No apparent impairments             OT - Cognition Comments: though resistant to participate                 Following commands: Intact        Cueing   Cueing Techniques: Verbal cues  Exercises Exercises: Other exercises Other Exercises Other Exercises: L ankle dorsiflexion Other Exercises: attempts for LLE knee extension though pt minimally tolerating    Shoulder Instructions       General Comments      Pertinent Vitals/ Pain       Pain  Assessment Pain Assessment: Faces Faces Pain Scale: Hurts little more Pain Location: abdomen, bottom Pain Descriptors / Indicators: Guarding, Grimacing, Sore Pain Intervention(s): Monitored during session, Limited activity within patient's tolerance  Home Living                                          Prior Functioning/Environment              Frequency  Min 1X/week        Progress Toward Goals  OT Goals(current goals can now be found in the care plan section)  Progress towards OT goals: Not progressing toward goals - comment  Acute Rehab OT Goals Patient Stated Goal: get better, go home OT Goal Formulation: With patient Time For Goal Achievement: 07/01/24 Potential to Achieve Goals: Fair ADL Goals Pt Will Perform Grooming: with modified independence;sitting Pt Will Perform Upper Body Dressing: with supervision;sitting Pt Will Perform Lower Body Dressing: with mod assist;with adaptive equipment;sitting/lateral leans;sit to/from stand Pt Will Transfer to Toilet: regular height toilet;ambulating;with min assist Pt Will Perform Toileting - Clothing Manipulation and hygiene: with modified independence Pt/caregiver will Perform Home Exercise Program: Increased strength;Increased ROM;Independently;Both right and left upper extremity Additional ADL Goal #1: pt will perform bed mobility min A in prep for ADLs  Plan      Co-evaluation                 AM-PAC OT 6 Clicks Daily Activity     Outcome Measure   Help from another person eating meals?: A Little Help from another person taking care of personal grooming?: A Little Help from another person toileting, which includes using toliet, bedpan, or urinal?: Total Help from another person bathing (including washing, rinsing, drying)?: A Lot Help from another person to put on and taking off regular upper body clothing?: A Lot Help from another person to put on and taking off regular lower body  clothing?: Total 6 Click Score: 12    End of Session Equipment Utilized During Treatment: Oxygen  OT Visit Diagnosis: Unsteadiness on feet (R26.81);Other abnormalities of gait and mobility (R26.89);Muscle weakness (generalized) (M62.81)   Activity Tolerance Patient limited by pain;Other (comment) (self limiting)   Patient Left in bed;with call bell/phone within reach   Nurse Communication          Time: [phone] OT Time Calculation (min): 13 min  Charges: OT General Charges $OT Visit: 1 Visit OT Treatments $Therapeutic Activity: 8-22 mins  Christina Castaneda, OTR/L Acute Rehab Services Office: [phone]   Christina Fish 06/30/2024, 10:06 AM

## 2024-06-30 NOTE — Progress Notes (Addendum)
 Nutrition Follow-up  DOCUMENTATION CODES:  Obesity unspecified  INTERVENTION:   Monitor diet advancement and tolerance, recommend DYS3 diet when advanced for ease of consumption Continue Ensure Plus High Protein po daily, each supplement provides 350 kcal and 20 grams of protein now that diet advanced to full liquid Discontinue Juven d/t patient preference Continue MVI w/ minerals Add Magic cup TID with meals, each supplement provides 290 kcal and 9 grams of protein    NUTRITION DIAGNOSIS:  Increased nutrient needs related to acute illness, wound healing as evidenced by estimated needs. - remains applicable  GOAL:  Patient will meet greater than or equal to 90% of their needs - progressing  MONITOR:  Diet advancement, Labs, Weight trends, I & O's, Skin  REASON FOR ASSESSMENT:  Rounds    ASSESSMENT:  Pt admitted after a recent fall leading to L distal femur fracture s/p L femoral nailing. PMH significant for perforated sigmoid colon s/p Hartmann procedure, s/p colostomy revision, HTN.  6/20: s/p L femoral nail 6/23: perforated bleeding duodenal ulcer; s/p exlap with graham patch repair, 1 prbc transfused 6/25: 1 prbc transfused 6/26: NGT removed, UGI: no leak; txr out of ICU, TPN initiated, clear liquid diet initiated 6/27: advanced to full liquid diet 6/29: advanced to GI soft diet; 6/30: TPN discontinued 7/02: black/tarry ostomy output, Hgb 6.1; 2 PRBC transfused; CTA: no active bleeding, deferred embolization; resumed clear liquid diet 7/03: more melena, IR reconsulted and embolization scheduled for today; 1 unit PRBC transfused  7/04: advanced to clear liquid diet 7/05: diet advanced to GI soft diet 7/06 - Hgb 6.8 - 1 unit PRBCs transfused 7/07: Hgb 6.7 - 1 unit PRBCs transfused; CTA repeated: question gastric ulcer 7/08 - Hgb 6.9 - 1 unit PRBCs transfused; GI consulted  Pt continues with melena of unknown source. Transfusions ongoing. Has received 10 units PRBCs thus  far this admission. General surgery advising against EGD as patient had recent Midwife. GI has been consulted.    Average Meal Intake 7/3: 0% x1 documented meal   Will need DYS3 diet once advanced to aid in consumption given that she has lost her dentures. Reports tolerating GI soft diet. Had Ensure and peaches for breakfast and had fruit last night for dinner. Reports consuming ONS with tolerance. Will continue. She is unwilling to receive Juven. Will d/c due to patient preference. Reiterated importance of adequate calorie and protein intake to promote wound healing and prevent loss of lean body mass.   Reports ostomy bag being changed once a day or every other day. No output documented, however patient reports it is not significant.   Given her extensive surgical hx this hospitalization, risk of malnutrition, and presence of wounds complicated by prolonged poor intake, would recommend TPN if unable to sufficiently meet her needs in next few days.    Admit Weight: 122.8 kg Current Weight: 114.6 kg Lowest Weight: 108.5kg on 7/06 +mild, pitting edema to BLEs, sacral as well   Per documentation, edema has improved. Continues with mild, pitting edema to BLEs. Some sacral edema also noted. Bowels stable. Ostomy output not excessive, however intake remains suboptimal. Dose of IV Lasix  ordered today.     Intake/Output Summary (Last 24 hours) at 06/30/2024 1328 Last data filed at 06/30/2024 1110 Gross per 24 hour  Intake 687.58 ml  Output 750 ml  Net -62.42 ml    Net IO Since Admission: -6,008.47 mL [06/30/24 1328]    WBC and Crt have normalized. BUN also trending down to  within range today. Monitoring labs for underlying catabolic etiology given patient's poor intake and significantly increased needs 2/2 inflammation, wounds, and recent surgical procedures. Albumin remains significantly low r/t both poor intake and is a marker of her acute stress response and level of inflammation.     Drains/lines: UOP: x24 hours RUQ colostomy: no output documented  Midline abdominal JP drain: 90ml x24 hours   Medications: nystatin , levothyroxine , MVI, pantoprazole    Labs from 7/07 reviewed:  Sodium 136>134>133 (L) BUN 26>32>49>52 (H) Albumin <1.5 CBG's 82-112 x24 hours   Diet Order:   Diet Order             DIET SOFT Room service appropriate? Yes; Fluid consistency: Thin  Diet effective now             EDUCATION NEEDS:  Not appropriate for education at this time  Skin:  Skin Assessment: Reviewed RN Assessment Skin Integrity Issues:: Stage III, Stage II, Other (Comment) Stage II: sacrum Stage III: mid coccyx Other: IAD L buttock, L groin, abdomen, bilateral sacrum, vagina  Last BM:  7/8 via ostomy  Height:  Ht Readings from Last 1 Encounters:  06/15/24 5' 5 (1.651 m)   Weight:  Wt Readings from Last 1 Encounters:  06/30/24 114.6 kg   Ideal Body Weight:  56.8 kg  BMI:  Body mass index is 42.04 kg/m.  Estimated Nutritional Needs:   Kcal:  1600-1800  Protein:  85-100g  Fluid:  >/=1.6L  Blair Deaner MS, RD, LDN Registered Dietitian Clinical Nutrition RD Inpatient Contact Info in Amion

## 2024-06-30 NOTE — Plan of Care (Signed)
  Problem: Education: Goal: Knowledge of General Education information will improve Description: Including pain rating scale, medication(s)/side effects and non-pharmacologic comfort measures Outcome: Progressing   Problem: Health Behavior/Discharge Planning: Goal: Ability to manage health-related needs will improve Outcome: Progressing   Problem: Clinical Measurements: Goal: Ability to maintain clinical measurements within normal limits will improve Outcome: Progressing Goal: Will remain free from infection Outcome: Progressing Goal: Diagnostic test results will improve Outcome: Progressing Goal: Respiratory complications will improve Outcome: Progressing Goal: Cardiovascular complication will be avoided Outcome: Progressing   Problem: Activity: Goal: Risk for activity intolerance will decrease Outcome: Progressing   Problem: Nutrition: Goal: Adequate nutrition will be maintained Outcome: Progressing   Problem: Coping: Goal: Level of anxiety will decrease Outcome: Progressing   Problem: Elimination: Goal: Will not experience complications related to bowel motility Outcome: Progressing Goal: Will not experience complications related to urinary retention Outcome: Progressing   Problem: Pain Managment: Goal: General experience of comfort will improve and/or be controlled Outcome: Progressing   Problem: Safety: Goal: Ability to remain free from injury will improve Outcome: Progressing   Problem: Skin Integrity: Goal: Risk for impaired skin integrity will decrease Outcome: Progressing   Problem: Safety: Goal: Non-violent Restraint(s) Outcome: Progressing   Problem: Activity: Goal: Ability to tolerate increased activity will improve Outcome: Progressing   Problem: Respiratory: Goal: Ability to maintain a clear airway and adequate ventilation will improve Outcome: Progressing   Problem: Role Relationship: Goal: Method of communication will improve Outcome:  Progressing   Problem: Education: Goal: Understanding of CV disease, CV risk reduction, and recovery process will improve Outcome: Progressing Goal: Individualized Educational Video(s) Outcome: Progressing   Problem: Activity: Goal: Ability to return to baseline activity level will improve Outcome: Progressing   Problem: Cardiovascular: Goal: Ability to achieve and maintain adequate cardiovascular perfusion will improve Outcome: Progressing Goal: Vascular access site(s) Level 0-1 will be maintained Outcome: Progressing   Problem: Health Behavior/Discharge Planning: Goal: Ability to safely manage health-related needs after discharge will improve Outcome: Progressing

## 2024-06-30 NOTE — Progress Notes (Signed)
   06/30/24 1558  Colostomy RUQ  Placement Date/Time: (c) 04/01/16 2000   Location: RUQ  Output (mL) 150 mL  Stool Measurement/Characteristics  Stool Descriptors Black  Stool Amount Large  Stool Source Colostomy

## 2024-07-01 ENCOUNTER — Encounter (HOSPITAL_COMMUNITY)
Admission: EM | Disposition: A | Discharge status: Skilled Nursing Facility | Payer: Self-pay | Source: Home / Self Care | Attending: Internal Medicine

## 2024-07-01 ENCOUNTER — Inpatient Hospital Stay (HOSPITAL_COMMUNITY)

## 2024-07-01 DIAGNOSIS — K269 Duodenal ulcer, unspecified as acute or chronic, without hemorrhage or perforation: Secondary | ICD-10-CM

## 2024-07-01 DIAGNOSIS — K921 Melena: Secondary | ICD-10-CM

## 2024-07-01 DIAGNOSIS — N179 Acute kidney failure, unspecified: Secondary | ICD-10-CM | POA: Diagnosis not present

## 2024-07-01 DIAGNOSIS — J449 Chronic obstructive pulmonary disease, unspecified: Secondary | ICD-10-CM | POA: Diagnosis not present

## 2024-07-01 DIAGNOSIS — D62 Acute posthemorrhagic anemia: Secondary | ICD-10-CM | POA: Diagnosis not present

## 2024-07-01 DIAGNOSIS — G4733 Obstructive sleep apnea (adult) (pediatric): Secondary | ICD-10-CM | POA: Diagnosis not present

## 2024-07-01 DIAGNOSIS — K276 Chronic or unspecified peptic ulcer, site unspecified, with both hemorrhage and perforation: Secondary | ICD-10-CM | POA: Diagnosis not present

## 2024-07-01 LAB — COMPREHENSIVE METABOLIC PANEL WITH GFR
ALT: 11 U/L (ref 0–44)
AST: 15 U/L (ref 15–41)
Albumin: <1.5 g/dL — ABNORMAL LOW (ref 3.5–5.0)
Alkaline Phosphatase: 47 U/L (ref 38–126)
Anion gap: 7 (ref 5–15)
BUN: 31 mg/dL — ABNORMAL HIGH (ref 8–23)
CO2: 27 mmol/L (ref 22–32)
Calcium: 7.6 mg/dL — ABNORMAL LOW (ref 8.9–10.3)
Chloride: 100 mmol/L (ref 98–111)
Creatinine, Ser: 0.99 mg/dL (ref 0.44–1.00)
GFR, Estimated: >60 mL/min (ref >60)
Glucose, Bld: 90 mg/dL (ref 70–99)
Potassium: 3.6 mmol/L (ref 3.5–5.1)
Sodium: 134 mmol/L — ABNORMAL LOW (ref 135–145)
Total Bilirubin: 0.4 mg/dL (ref 0.0–1.2)
Total Protein: 3.9 g/dL — ABNORMAL LOW (ref 6.5–8.1)

## 2024-07-01 LAB — CBC
HCT: 21.9 % — ABNORMAL LOW (ref 36.0–46.0)
HCT: 21.8 % — ABNORMAL LOW (ref 36.0–46.0)
Hemoglobin: 7.2 g/dL — ABNORMAL LOW (ref 12.0–15.0)
Hemoglobin: 7 g/dL — ABNORMAL LOW (ref 12.0–15.0)
MCH: 31 pg (ref 26.0–34.0)
MCH: 30.4 pg (ref 26.0–34.0)
MCHC: 32.9 g/dL (ref 30.0–36.0)
MCHC: 32.1 g/dL (ref 30.0–36.0)
MCV: 94.4 fL (ref 80.0–100.0)
MCV: 94.8 fL (ref 80.0–100.0)
Platelets: 200 K/uL (ref 150–400)
Platelets: 194 K/uL (ref 150–400)
RBC: 2.32 MIL/uL — ABNORMAL LOW (ref 3.87–5.11)
RBC: 2.3 MIL/uL — ABNORMAL LOW (ref 3.87–5.11)
RDW: 18 % — ABNORMAL HIGH (ref 11.5–15.5)
RDW: 18.1 % — ABNORMAL HIGH (ref 11.5–15.5)
WBC: 8.3 K/uL (ref 4.0–10.5)
WBC: 6.4 K/uL (ref 4.0–10.5)
nRBC: 0 % (ref 0.0–0.2)
nRBC: 0 % (ref 0.0–0.2)

## 2024-07-01 LAB — BPAM CRYOPRECIPITATE
Blood Product Expiration Date: 202507082359
ISSUE DATE / TIME: 202507081755
Unit Type and Rh: 5100

## 2024-07-01 LAB — PREPARE CRYOPRECIPITATE: Unit division: 0

## 2024-07-01 LAB — PREPARE RBC (CROSSMATCH)

## 2024-07-01 SURGERY — EGD (ESOPHAGOGASTRODUODENOSCOPY)
Anesthesia: Monitor Anesthesia Care

## 2024-07-01 MED ORDER — SUCRALFATE 1 GM/10ML PO SUSP
1.0000 g | Freq: Three times a day (TID) | ORAL | Status: DC
  Administered 2024-07-01 – 2024-07-11 (×42): 1 g via ORAL
  Filled 2024-07-01 (×45): qty 10

## 2024-07-01 MED ORDER — PANTOPRAZOLE SODIUM 40 MG IV SOLR
80.0000 mg | Freq: Two times a day (BID) | INTRAVENOUS | Status: AC
  Administered 2024-07-01 – 2024-07-04 (×6): 80 mg via INTRAVENOUS
  Filled 2024-07-01 (×6): qty 20

## 2024-07-01 MED ORDER — DIPHENHYDRAMINE HCL 25 MG PO CAPS
25.0000 mg | ORAL_CAPSULE | Freq: Once | ORAL | Status: AC
  Administered 2024-07-01: 25 mg via ORAL
  Filled 2024-07-01: qty 1

## 2024-07-01 MED ORDER — FUROSEMIDE 10 MG/ML IJ SOLN
20.0000 mg | Freq: Once | INTRAMUSCULAR | Status: AC
  Administered 2024-07-01: 20 mg via INTRAVENOUS
  Filled 2024-07-01: qty 2

## 2024-07-01 MED ORDER — ALTEPLASE 2 MG IJ SOLR
2.0000 mg | Freq: Once | INTRAMUSCULAR | Status: AC
  Administered 2024-07-01: 2 mg
  Filled 2024-07-01: qty 2

## 2024-07-01 MED ORDER — SODIUM CHLORIDE 0.9% IV SOLUTION: Freq: Once | INTRAVENOUS | Status: AC

## 2024-07-01 MED ORDER — IOHEXOL 350 MG/ML SOLN
100.0000 mL | Freq: Once | INTRAVENOUS | Status: AC | PRN
  Administered 2024-07-01: 100 mL via INTRAVENOUS

## 2024-07-01 MED ORDER — DIPHENHYDRAMINE HCL 50 MG/ML IJ SOLN: 25.0000 mg | Freq: Once | INTRAMUSCULAR | Status: DC

## 2024-07-01 MED ORDER — PANTOPRAZOLE SODIUM 40 MG IV SOLR
40.0000 mg | Freq: Two times a day (BID) | INTRAVENOUS | Status: DC
  Administered 2024-07-04 – 2024-07-06 (×4): 40 mg via INTRAVENOUS
  Filled 2024-07-01 (×4): qty 10

## 2024-07-01 NOTE — Progress Notes (Addendum)
 PROGRESS NOTE        PATIENT DETAILS Name: Christina Castaneda Age: 68 y.o. Sex: female Date of Birth: 26-Jan-1956 Admit Date: 06/07/2024 Admitting Physician Orlin Fairly, MD ERE:Floopd, Mariano SQUIBB, DO  Brief Summary: Patient is a 68 y.o.  female with history of perforated sigmoid colon-s/p Hartman's/colostomy 2017-who initially presented to [facility] following a mechanical fall-she was found to have left distal femur fracture-underwent retrograde femoral nail on 6/20, unfortunately-further hospital course complicated by perforated duodenal ulcer with acute blood loss anemia resulting in hypovolemic shock-she was evaluated by general surgery and underwent emergent exploratory laparotomy with Graham's patch-postoperatively-she was transferred to [facility] ICU.  She was stabilized and then subsequently transferred to [facility].  Unfortunately-further hospital course complicated by recurrent melena through ostomy and acute blood loss anemia-after discussion with CCS and then with interventional radiology-patient underwent on GDA embolization on 7/4.  See below for further details.   Significant events: 6/15>> mechanical fall-left femur fracture/AKI-admit to [facility] at [facility]. 6/20>> retrograde femoral nail by orthopedics at [facility] 6/23>> perforated duodenal ulcer-hypotension-exploratory laparotomy/placement of Arlyss supplies.  Remained intubated postprocedure-transferred to [facility] ICU. 6/24>> extubated 6/25>> off pressors-out of ICU-drop in hemoglobin but nonbloody ostomy output. 6/26>> transferred to [facility] 7/02>> Black tarry stools in ostomy-Hb down to 6.1-General Surgery reconsulted-too early for EGD-CT angio negative for bleed-IR deferred embolization 7/03 >> continues to have black tarry stools-IR performed GDA embolization. 7/06>>  Hb 6.8-continues to have black stools in ostomy-1 unit of blood ordered. 7/07>> Hb 6.7-CT angiogram negative-1 unit of blood ordered. 7/08>> Hb 6.9-1 unit of  blood ordered.  GI consulted.  CCS discussed with IR-no further embolization options-GDA completely occluded on prior study.  Extensive discussion with CCS/GI-CCS felt, risk of endoscopy was significant-patient felt to be at significant risk of perforation-with no real good operative fix for recurrent perf.  CCS ordered cryoprecipitate.  CCS opined that EGD be reserved only for hemodynamically unstable bleed or consider EGD once patient was at least 1 month out from laparotomy.   Significant studies: 6/15>> CT left knee: Acute comminuted fracture of distal left femoral shaft. 6/16>> renal ultrasound: No hydronephrosis 6/23>> CT angiogram abdomen: Perforated and bleeding peptic ulcer disease affecting proximal duodenum-appears to be hematoma in the gastric antrum/proximal duodenum. 6/26>> upper GI series with KUB: No evidence of contrast leak. 7/02>> CT angio GI bleed study: No active bleeding.  Significant microbiology data: 6/23>> blood culture: No growth 6/23>> peritoneal fluid: Abundant Candida albicans/moderate Candida glabrata.  Procedures: 6/20>> retrograde femoral nail by orthopedics at [facility] 6/23>> exploratory laparotomy with omental Graham's patch repair 7/03>> GDA embolization.  Consults: GI at [facility] General Surgery at [facility] and [facility] IR  Subjective: No major issues-appears unchanged-continues to have melanotic appearing stools in her ostomy bag.  No vomiting.   Objective: Vitals: Blood pressure (!) 101/50, pulse (!) 57, temperature 98 F (36.7 C), temperature source Oral, resp. rate 16, height 5' 5 (1.651 m), weight 114.6 kg, last menstrual period 11/18/2012, SpO2 94%.   Exam: Awake/alert Chest: Clear to auscultation CVS: S1-S2 regular Abdomen: Soft nontender-Black stools in ostomy Extremities: No edema Neurology: Nonfocal exam-but has generalized weakness all over.  Pertinent Labs/Radiology:    Latest Ref Rng & Units 07/01/2024    3:29 AM 06/30/2024    4:30 PM  06/30/2024   12:21 PM  CBC  WBC 4.0 - 10.5 K/uL 8.3  9.4  8.5   Hemoglobin 12.0 - 15.0 g/dL 7.2  8.3  7.5   Hematocrit 36.0 - 46.0 % 21.9  24.4  22.3   Platelets 150 - 400 K/uL 200  210  198     Lab Results  Component Value Date   NA 134 (L) 07/01/2024   K 3.6 07/01/2024   CL 100 07/01/2024   CO2 27 07/01/2024     Assessment/Plan: Hemorrhagic and septic shock secondary to perforated bleeding duodenal ulcer with acute blood loss anemia-s/p exploratory laparotomy with Arlyss patch repair on 6/23 at [facility] Recurrent upper GI bleeding with acute blood loss anemia noted on 7/2-CTA negative for extravasation x2-s/p GDA embolization by IR on 7/4. Had stabilized after laparotomy with Arlyss patch repair-however unfortunately developed upper GI bleed-with melanotic stool on 7/2-multiple units of PRBC transfused-CTA was negative for GI bleed-IR was subsequently consulted-underwent gastroduodenal artery embolization on 7/3. Unfortunately-even with embolization (IR-Dr Vanice reviewed images on 7/8-GDA completely occluded on prior embolization on 7/4-no further embolization options) -patient continues to have melanotic stools-has required multiple units of PRBC (10 units of PRBC so far).  GI opinion was sought on 7/8-tentative plans for EGD were made-however general surgery-Dr. Bruce that it was too early for EGD and that risk of recurrent duodenal perforation was very high with insufflation during EGD.  Unfortunately if she were to perforate, she really did not have good surgical options.  It was felt that EGD was to be reserved for a scenario where patient developed hemodynamically significant bleeding or it potentially could be done for continued bleeding if patient was at least 1 month out from her laparotomy.   As noted above-she continues to have melanotic appearing stools-Hb has downtrended-(last transfusion was on 7/8)- plan at this point is to follow CBC and transfuse as needed Will discuss with  pharmacy and see if we can place her on a higher dose of Protonix -repeat CTA on 7/7 with possible ulcer in her stomach. Follow closely-very difficult situation-really no good options to fix bleeding.  Addendum Hb down to 7-continues to have melena-will transfuse 1 additional unit of PRBC and continue to follow CBC.  Mechanical fall with left femur fracture-s/p retrograde left femoral nail on 6/20 Per orthopedics-WBAT LLE, knee immobilizer when ambulating, remove staples in 2 weeks.  Follow-up with Dr. Oneil Salvage at [place] 2 weeks from discharge. High risk for VTE-unfortunately unable to use pharmacological prophylaxis-due to above situation.  AKI Likely hemodynamically mediated Resolved  Acute urinary retention Foley catheter remains in place-will perform voiding trial in the next several days-if no further surgical procedures are planned.  HTN BP soft-continue to hold all antihypertensives  HLD Continue to hold Crestor -resume when able-currently n.p.o.  COPD Stable-no wheezing Continue bronchodilators  OSA CPAP nightly  Hypothyroidism Synthroid   History of perforated sigmoid colon-s/p Hartman/colostomy 2017  Debility/deconditioning PT/OT eval-SNF recommended.  Nutrition Status: Nutrition Problem: Increased nutrient needs Etiology: acute illness, wound healing Signs/Symptoms: estimated needs Interventions: Refer to RD note for recommendations   Pressure Ulcer: Agree with assessment and plan as outlined below. Pressure Injury 05/27/24 Sacrum Stage 2 -  Partial thickness loss of dermis presenting as a shallow open injury with a red, pink wound bed without slough. 4 small stage II present, each measures 0.4x 0.4 x 0.2 (Active)  05/27/24 1815  Location: Sacrum  Location Orientation:   Staging: Stage 2 -  Partial thickness loss of dermis presenting as a shallow open injury with a red, pink wound bed without slough.  Wound  Description (Comments): 4 small stage  II present, each measures 0.4x 0.4 x 0.2  DO NOT USE:  Present on Admission:   Dressing Type Foam - Lift dressing to assess site every shift 06/29/24 1637    Morbid Obesity: Estimated body mass index is 42.04 kg/m as calculated from the following:   Height as of this encounter: 5' 5 (1.651 m).   Weight as of this encounter: 114.6 kg.   Code status:   Code Status: Full Code   DVT Prophylaxis: Place and maintain sequential compression device Start: 06/18/24 1325 SCDs Start: 06/15/24 1937 Place and maintain sequential compression device Start: 06/15/24 0155 SQ heparin  discontinued 7/2-due to high GI bleeding with ABLA.  Family Communication: Spouse Randall-[phone]-updated 7/9-updated him in great detail over the phone in regards to above situation with ongoing bleed.   Disposition Plan: Status is: Inpatient Remains inpatient appropriate because: Severity of illness   Planned Discharge Destination:Skilled nursing facility   Diet: Diet Order             Diet clear liquid Fluid consistency: Thin  Diet effective now                     Antimicrobial agents: Anti-infectives (From admission, onward)    Start     Dose/Rate Route Frequency Ordered Stop   06/16/24 1800  vancomycin  (VANCOCIN ) IVPB 1000 mg/200 mL premix  Status:  Discontinued        1,000 mg 200 mL/hr over 60 Minutes Intravenous Every 24 hours 06/15/24 1317 06/15/24 1328   06/15/24 1700  vancomycin  (VANCOREADY) IVPB 2000 mg/400 mL  Status:  Discontinued        2,000 mg 200 mL/hr over 120 Minutes Intravenous  Once 06/15/24 1317 06/15/24 1328   06/15/24 1600  fluconazole  (DIFLUCAN ) IVPB 400 mg  Status:  Discontinued        400 mg 100 mL/hr over 120 Minutes Intravenous Every 24 hours 06/15/24 1317 06/16/24 0914   06/15/24 1500  metroNIDAZOLE  (FLAGYL ) IVPB 500 mg  Status:  Discontinued        500 mg 100 mL/hr over 60 Minutes Intravenous Every 12 hours 06/15/24 1216 06/15/24 1255   06/15/24 1400   ciprofloxacin  (CIPRO ) IVPB 400 mg  Status:  Discontinued        400 mg 200 mL/hr over 60 Minutes Intravenous Every 12 hours 06/15/24 1216 06/15/24 1255   06/15/24 1400  piperacillin -tazobactam (ZOSYN ) IVPB 3.375 g        3.375 g 12.5 mL/hr over 240 Minutes Intravenous Every 8 hours 06/15/24 1317 06/20/24 2359   06/15/24 1400  vancomycin  (VANCOCIN ) IVPB 1000 mg/200 mL premix  Status:  Discontinued        1,000 mg 200 mL/hr over 60 Minutes Intravenous Every 24 hours 06/15/24 1328 06/16/24 0914   06/15/24 1333  vancomycin  (VANCOCIN ) 1-5 GM/200ML-% IVPB       Note to Pharmacy: Romona Shaver E: cabinet override      06/15/24 1333 06/15/24 1410   06/15/24 1221  ciprofloxacin  (CIPRO ) 400 MG/200ML IVPB  Status:  Discontinued       Note to Pharmacy: Dannielle Railing S: cabinet override      06/15/24 1221 06/15/24 1343   06/15/24 1221  metroNIDAZOLE  (FLAGYL ) 500 MG/100ML IVPB  Status:  Discontinued       Note to Pharmacy: Dannielle Railing S: cabinet override      06/15/24 1221 06/15/24 1343   06/12/24 1223  clindamycin  (CLEOCIN ) 900 MG/50ML IVPB  Note to Pharmacy: Elaine Nest L: cabinet override      06/12/24 1223 06/12/24 1332   06/12/24 1131  ceFAZolin  (ANCEF ) 2-4 GM/100ML-% IVPB  Status:  Discontinued       Note to Pharmacy: Billy Sluder H: cabinet override      06/12/24 1131 06/12/24 1434   06/11/24 1300  clindamycin  (CLEOCIN ) IVPB 900 mg  Status:  Discontinued        900 mg 100 mL/hr over 30 Minutes Intravenous On call to O.R. 06/10/24 1041 06/10/24 1447   06/11/24 1300  ceFAZolin  (ANCEF ) IVPB 2g/100 mL premix        2 g 200 mL/hr over 30 Minutes Intravenous On call to O.R. 06/10/24 1447 06/12/24 1908        MEDICATIONS: Scheduled Meds:  arformoterol   15 mcg Nebulization Q12H   budesonide  (PULMICORT ) nebulizer solution  0.5 mg Nebulization BID   Chlorhexidine  Gluconate Cloth  6 each Topical Daily   feeding supplement  237 mL Oral BID BM   levothyroxine   150 mcg Oral  Q0600   multivitamin with minerals  1 tablet Oral Daily   nystatin    Topical BID   pantoprazole  (PROTONIX ) IV  40 mg Intravenous Q12H   sodium chloride  flush  10-40 mL Intracatheter Q12H   sodium chloride  flush  3 mL Intravenous Q12H   sodium hypochlorite   Irrigation BID   sucralfate   1 g Oral TID WC & HS   Continuous Infusions: PRN Meds:.dextrose , iohexol , ipratropium-albuterol , magic mouthwash w/lidocaine , morphine  injection, naLOXone  (NARCAN )  injection, [DISCONTINUED] ondansetron  **OR** ondansetron  (ZOFRAN ) IV, mouth rinse, oxyCODONE , polyethylene glycol, prochlorperazine , sodium chloride  flush, sodium chloride  flush   I have personally reviewed following labs and imaging studies  LABORATORY DATA: CBC: Recent Labs  Lab 06/29/24 1600 06/30/24 0500 06/30/24 1221 06/30/24 1630 07/01/24 0329  WBC 9.7 8.1 8.5 9.4 8.3  HGB 6.7* 6.9* 7.5* 8.3* 7.2*  HCT 20.2* 20.9* 22.3* 24.4* 21.9*  MCV 94.8 93.3 92.9 90.7 94.4  PLT 214 208 198 210 200    Basic Metabolic Panel: Recent Labs  Lab 06/25/24 0355 06/26/24 0445 06/27/24 0428 06/29/24 0412 07/01/24 0329  NA 133* 136 134* 133* 134*  K 3.8 3.6 3.6 3.8 3.6  CL 100 103 102 100 100  CO2 26 24 24 27 27   GLUCOSE 90 76 112* 82 90  BUN 52* 40* 36* 18 31*  CREATININE 1.05* 1.18* 1.04* 0.87 0.99  CALCIUM  8.2* 7.8* 8.1* 7.6* 7.6*    GFR: Estimated Creatinine Clearance: 66.8 mL/min (by C-G formula based on SCr of 0.99 mg/dL).  Liver Function Tests: Recent Labs  Lab 07/01/24 0329  AST 15  ALT 11  ALKPHOS 47  BILITOT 0.4  PROT 3.9*  ALBUMIN <1.5*    No results for input(s): LIPASE, AMYLASE in the last 168 hours. No results for input(s): AMMONIA in the last 168 hours.  Coagulation Profile: Recent Labs  Lab 06/29/24 1538  INR 1.4*    Cardiac Enzymes: No results for input(s): CKTOTAL, CKMB, CKMBINDEX, TROPONINI in the last 168 hours.  BNP (last 3 results) No results for input(s): PROBNP in the last  8760 hours.  Lipid Profile: No results for input(s): CHOL, HDL, LDLCALC, TRIG, CHOLHDL, LDLDIRECT in the last 72 hours.   Thyroid  Function Tests: No results for input(s): TSH, T4TOTAL, FREET4, T3FREE, THYROIDAB in the last 72 hours.  Anemia Panel: No results for input(s): VITAMINB12, FOLATE, FERRITIN, TIBC, IRON, RETICCTPCT in the last 72 hours.  Urine analysis:  Component Value Date/Time   COLORURINE YELLOW 06/07/2024 2026   APPEARANCEUR HAZY (A) 06/07/2024 2026   LABSPEC 1.015 06/07/2024 2026   PHURINE 5.0 06/07/2024 2026   GLUCOSEU NEGATIVE 06/07/2024 2026   HGBUR NEGATIVE 06/07/2024 2026   BILIRUBINUR NEGATIVE 06/07/2024 2026   KETONESUR NEGATIVE 06/07/2024 2026   PROTEINUR NEGATIVE 06/07/2024 2026   NITRITE NEGATIVE 06/07/2024 2026   LEUKOCYTESUR NEGATIVE 06/07/2024 2026    Sepsis Labs: Lactic Acid, Venous    Component Value Date/Time   LATICACIDVEN 1.0 05/27/2024 1945    MICROBIOLOGY: No results found for this or any previous visit (from the past 240 hours).   RADIOLOGY STUDIES/RESULTS: CT ANGIO GI BLEED Result Date: 06/29/2024 CLINICAL DATA:  Upper GI bleed. EXAM: CTA ABDOMEN AND PELVIS WITHOUT AND WITH CONTRAST TECHNIQUE: Multidetector CT imaging of the abdomen and pelvis was performed using the standard protocol during bolus administration of intravenous contrast. Multiplanar reconstructed images and MIPs were obtained and reviewed to evaluate the vascular anatomy. RADIATION DOSE REDUCTION: This exam was performed according to the departmental dose-optimization program which includes automated exposure control, adjustment of the mA and/or kV according to patient size and/or use of iterative reconstruction technique. CONTRAST:  OMNIPAQUE  IOHEXOL  350 MG/ML SOLN COMPARISON:  CT dated 06/24/2024. FINDINGS: VASCULAR Aorta: Moderate atherosclerotic calcification. No aneurysmal dilatation or dissection. Celiac: The celiac artery,  splenic, and left gastric artery branches are patent. Coil embolization of the gastroduodenal artery. SMA: The SMA is patent. Renals: The renal arteries are patent. Duplicated right renal artery anatomy. IMA: Diminished opacification of the IMA. Inflow: Mild atherosclerotic calcification. The iliac arteries are patent. No evidence of dilatation or dissection. Proximal Outflow: The visualized proximal outflow is patent. Veins: The IVC is unremarkable. The SMV, splenic vein, and main portal vein are patent. No portal venous gas. Review of the MIP images confirms the above findings. NON-VASCULAR Lower chest: Partially visualized moderate-sized bilateral pleural effusions with compressive atelectasis of the adjacent lungs. No intra-abdominal free air or free fluid. Hepatobiliary: Mild irregularity of the liver contour may represent early changes of cirrhosis. There is mild biliary dilatation, post cholecystectomy. No retained calcified stone noted in the central CBD. A drainage catheter noted inferior to the liver. No drainable fluid collection noted along the catheter. Pancreas: Unremarkable. No pancreatic ductal dilatation or surrounding inflammatory changes. Spleen: Normal in size without focal abnormality. Adrenals/Urinary Tract: The adrenal glands are unremarkable. There is an 8 mm nonobstructing left renal upper pole calculus. Duplicated right renal collecting system. A 2.7 x 2.7 cm hypoenhancing area in the inferior pole of the right kidney is new since the prior CT may represent an area of infarct or a lobar nephronia/developing abscess. There is mild right hydronephrosis. The urinary bladder is decompressed around a Foley catheter. Stomach/Bowel: Retained oral contrast noted in the colon. 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 There is sigmoid diverticulosis. There is no bowel obstruction. Evaluation for active GI bleed is very limited due to presence of oral contrast. No  extravasated contrast noted to suggest active GI bleed. There is inflammatory changes and thickening of the distal stomach which may represent gastritis or related to a gastric ulcer. Appendectomy. Lymphatic: No adenopathy. Reproductive: Hysterectomy. Other: Large ventral hernia containing portion of the stomach, loops of small bowel and part of colon. There is mild upper mesentery, and periportal edema. Musculoskeletal: Osteopenia with degenerative changes. No acute osseous pathology. IMPRESSION: 1. No evidence of active GI bleed. 2. Inflammatory changes and thickening of the  distal stomach may represent gastritis or related to a gastric ulcer. Sigmoid diverticulosis. No bowel obstruction. 3. Partially visualized moderate-sized bilateral pleural effusions with compressive atelectasis of the adjacent lungs. 4. Large bowel containing ventral hernia.  No bowel obstruction. 5. A 2.7 x 2.7 cm hypoenhancing area in the inferior pole of the right kidney is new since the prior CT may represent an area of infarct or a lobar nephronia/developing abscess. There is mild right hydronephrosis. 6. An 8 mm nonobstructing left renal upper pole calculus. 7.  Aortic Atherosclerosis (ICD10-I70.0). Electronically Signed   By: Vanetta Chou M.D.   On: 06/29/2024 13:34      LOS: 24 days   Donalda Applebaum, MD  [facility]    To contact the attending provider between 7A-7P or the covering provider during after hours 7P-7A, please log into the web site [email or web address] and access using universal [place] password for that web site. If you do not have the password, please call the hospital operator.  07/01/2024, 11:25 AM

## 2024-07-01 NOTE — Progress Notes (Signed)
 [facility] Progress Note  Chief Complaint: Melena and blood loss anemia  History:  She reports feeling the same as yesterday and is frustrated that she feels no progress. See my addendum to yesterday's consult note where surgery recommended we now proceed with EGD She continues to have melena today and a drop in hemoglobin after yesterday's transfusion of PRBCs  ROS: Cardiovascular: Denies chest pain Respiratory: Has dyspnea   Objective:   Current Facility-Administered Medications:    arformoterol  (BROVANA ) nebulizer solution 15 mcg, 15 mcg, Nebulization, Q12H, Payne, John D, PA-C, 15 mcg at 07/01/24 0815   budesonide  (PULMICORT ) nebulizer solution 0.5 mg, 0.5 mg, Nebulization, BID, Emilio, John D, PA-C, 0.5 mg at 07/01/24 9185   Chlorhexidine  Gluconate Cloth 2 % PADS 6 each, 6 each, Topical, Daily, Onesimo Oneil LABOR, MD, 6 each at 07/01/24 9077   dextrose  50 % solution 50 mL, 50 mL, Intravenous, PRN, Hall, Carole N, DO, 50 mL at 06/18/24 0441   feeding supplement (ENSURE PLUS HIGH PROTEIN) liquid 237 mL, 237 mL, Oral, BID BM, Elgergawy, Dawood S, MD, 237 mL at 06/30/24 1257   iohexol  (OMNIPAQUE ) 300 MG/ML solution 100 mL, 100 mL, Per Tube, Once PRN, Tammy Sor, PA-C, 100 mL at 06/18/24 1012   ipratropium-albuterol  (DUONEB) 0.5-2.5 (3) MG/3ML nebulizer solution 3 mL, 3 mL, Nebulization, Q4H PRN, Payne, John D, PA-C, 3 mL at 06/30/24 1650   levothyroxine  (SYNTHROID ) tablet 150 mcg, 150 mcg, Oral, Q0600, Elgergawy, Dawood S, MD, 150 mcg at 07/01/24 0549   magic mouthwash w/lidocaine , 15 mL, Oral, QID PRN, Onesimo Oneil LABOR, MD   morphine  (PF) 2 MG/ML injection 1 mg, 1 mg, Intravenous, Q4H PRN, Ghimire, Shanker M, MD, 1 mg at 07/01/24 9078   multivitamin with minerals tablet 1 tablet, 1 tablet, Oral, Daily, Elgergawy, Dawood S, MD, 1 tablet at 07/01/24 9077   naloxone  (NARCAN ) injection 0.4 mg, 0.4 mg, Intravenous, PRN, Rathore, Vasundhra, MD   nystatin  (MYCOSTATIN /NYSTOP ) topical powder, ,  Topical, BID, Onesimo Oneil LABOR, MD, Given at 07/01/24 [phone]   [DISCONTINUED] ondansetron  (ZOFRAN ) tablet 4 mg, 4 mg, Oral, Q6H PRN, 4 mg at 06/11/24 1716 **OR** ondansetron  (ZOFRAN ) injection 4 mg, 4 mg, Intravenous, Q6H PRN, Onesimo Oneil LABOR, MD, 4 mg at 06/26/24 2100   Oral care mouth rinse, 15 mL, Mouth Rinse, PRN, Onesimo Oneil LABOR, MD   oxyCODONE  (Oxy IR/ROXICODONE ) immediate release tablet 5 mg, 5 mg, Oral, Q4H PRN, Elgergawy, Brayton RAMAN, MD, 5 mg at 07/01/24 1431   [START ON 07/04/2024] pantoprazole  (PROTONIX ) injection 40 mg, 40 mg, Intravenous, Q12H, Ghimire, Donalda HERO, MD   pantoprazole  (PROTONIX ) injection 80 mg, 80 mg, Intravenous, Q12H, Ghimire, Donalda HERO, MD   polyethylene glycol (MIRALAX  / GLYCOLAX ) packet 17 g, 17 g, Oral, Daily PRN, Hall, Carole N, DO   prochlorperazine  (COMPAZINE ) injection 10 mg, 10 mg, Intravenous, Q6H PRN, Segars, Jonathan, MD, 10 mg at 06/26/24 1512   sodium chloride  flush (NS) 0.9 % injection 10-40 mL, 10-40 mL, Intracatheter, Q12H, Elgergawy, Brayton RAMAN, MD, 10 mL at 07/01/24 9077   sodium chloride  flush (NS) 0.9 % injection 10-40 mL, 10-40 mL, Intracatheter, PRN, Elgergawy, Brayton RAMAN, MD   sodium chloride  flush (NS) 0.9 % injection 3 mL, 3 mL, Intravenous, Q12H, Johnson, Clanford L, MD, 3 mL at 07/01/24 9077   sodium chloride  flush (NS) 0.9 % injection 3 mL, 3 mL, Intravenous, PRN, Johnson, Clanford L, MD   sucralfate  (CARAFATE ) 1 GM/10ML suspension 1 g, 1 g, Oral, TID WC & HS, Simaan,  Almarie RAMAN, PA-C, 1 g at 07/01/24 1144     Vital signs in last 24 hrs: Vitals:   07/01/24 0814 07/01/24 1203  BP:  (!) 114/103  Pulse:  71  Resp:  18  Temp:  98.6 F (37 C)  SpO2: 94% 96%    Intake/Output Summary (Last 24 hours) at 07/01/2024 1655 Last data filed at 07/01/2024 1435 Gross per 24 hour  Intake 338.75 ml  Output 2030 ml  Net -1691.25 ml     Physical Exam Debilitated woman laying in bed, wakes to stimulation and is interactive, makes variable eye contact but  does not want to talk much HEENT: sclera anicteric, oral mucosa without lesions Neck: supple, no thyromegaly, JVD or lymphadenopathy Cardiac: RRR without murmurs, S1S2 heard, + peripheral edema Pulm: Low inspiratory volumes, no respiratory distress apparent Abdomen: soft, upper tenderness, with active bowel sounds and no distention.  Large upper abdominal wall hernia as before right sided colostomy with melena, upper abdominal JP drain with small amount of serous fluid Skin; warm and dry, pale, no jaundice  Recent Labs:     Latest Ref Rng & Units 07/01/2024    3:29 AM 06/30/2024    4:30 PM 06/30/2024   12:21 PM  CBC  WBC 4.0 - 10.5 K/uL 8.3  9.4  8.5   Hemoglobin 12.0 - 15.0 g/dL 7.2  8.3  7.5   Hematocrit 36.0 - 46.0 % 21.9  24.4  22.3   Platelets 150 - 400 K/uL 200  210  198     Recent Labs  Lab 06/29/24 1538  INR 1.4*      Latest Ref Rng & Units 07/01/2024    3:29 AM 06/29/2024    4:12 AM 06/27/2024    4:28 AM  CMP  Glucose 70 - 99 mg/dL 90  82  887   BUN 8 - 23 mg/dL 31  18  36   Creatinine 0.44 - 1.00 mg/dL 9.00  9.12  8.95   Sodium 135 - 145 mmol/L 134  133  134   Potassium 3.5 - 5.1 mmol/L 3.6  3.8  3.6   Chloride 98 - 111 mmol/L 100  100  102   CO2 22 - 32 mmol/L 27  27  24    Calcium  8.9 - 10.3 mg/dL 7.6  7.6  8.1   Total Protein 6.5 - 8.1 g/dL 3.9     Total Bilirubin 0.0 - 1.2 mg/dL 0.4     Alkaline Phos 38 - 126 U/L 47     AST 15 - 41 U/L 15     ALT 0 - 44 U/L 11        Radiologic studies: CLINICAL DATA:  Evaluate for GI bleed.   EXAM: CTA ABDOMEN AND PELVIS WITHOUT AND WITH CONTRAST   TECHNIQUE: Multidetector CT imaging of the abdomen and pelvis was performed using the standard protocol during bolus administration of intravenous contrast. Multiplanar reconstructed images and MIPs were obtained and reviewed to evaluate the vascular anatomy.   RADIATION DOSE REDUCTION: This exam was performed according to the departmental dose-optimization program which  includes automated exposure control, adjustment of the mA and/or kV according to patient size and/or use of iterative reconstruction technique.   CONTRAST:  OMNIPAQUE  IOHEXOL  350 MG/ML SOLN   COMPARISON:  CT dated 06/29/2024.   FINDINGS: VASCULAR   Aorta: Moderate atherosclerotic calcification. No aneurysmal dilatation or dissection. No periaortic fluid collection.   Celiac: The celiac artery and its major branches are patent. Coil  embolization of the GDA.   SMA: Atherosclerotic calcification of the origin of the SMA. The SMA remains patent.   Renals: Atherosclerotic calcification of the renal arteries. The renal arteries are patent. Duplicated right renal artery anatomy.   IMA: The IMA is patent.   Inflow: Mild atherosclerotic calcification. No aneurysmal dilatation or dissection. The iliac arteries are patent.   Proximal Outflow: The visualized proximal outflows patent.   Veins: The IVC is unremarkable. The SMV, splenic vein, and main portal vein are patent no portal venous gas.   Review of the MIP images confirms the above findings.   NON-VASCULAR   Lower chest: Partially visualized bilateral pleural effusions with associated compressive atelectasis of the adjacent lungs. Pneumonia is not excluded.   No intra-abdominal free air.  Trace perihepatic ascites.   Hepatobiliary: Slight irregularity of the liver contour may represent changes of cirrhosis. Clinical correlation recommended. Cholecystectomy. No retained calcified stone noted in the central CBD. JP drainage catheter in similar position along the inferior surface of the liver. Trace fluid noted adjacent to the catheter.   Pancreas: Unremarkable. No pancreatic ductal dilatation or surrounding inflammatory changes.   Spleen: Normal in size without focal abnormality.   Adrenals/Urinary Tract: The adrenal glands unremarkable. An 8 mm nonobstructing left renal upper pole calculus. Duplicated  right renal collecting system. Similar appearance of a hypoenhancing area in the inferior pole of the right kidney as the prior CT. This may represent an area of infarct or lobar nephronia/developing abscess. Underlying mass is not excluded. Attention on follow-up imaging recommended. Mild right hydronephrosis. The urinary bladder is decompressed around a Foley catheter.   Stomach/Bowel: There is diffuse thickening of the distal stomach, slightly progressed since the prior CT and may represent gastritis. Underlying small gastric ulcer is not excluded. There is contrast in the proximal duodenum which appears relatively unchanged on the precontrast and postcontrast images and may represent retained oral contrast seen within the stomach on the prior CT. No evidence of active GI bleed on today's exam.   Postsurgical changes of the bowel with right anterior colostomy. There is sigmoid diverticulosis. Oral contrast noted in the proximal colon extends into the colostomy. No bowel obstruction. Appendectomy.   Lymphatic: No adenopathy.   Reproductive: Hysterectomy.   Other: Large ventral hernia containing portion of the stomach, large and small bowel.   Musculoskeletal: Osteopenia with degenerative changes. Left femoral nail. No acute osseous pathology. Sacral decubitus ulcer. No fluid collection.   IMPRESSION: 1. No evidence of active GI bleed on today's exam. 2. Diffuse thickening of the distal stomach, slightly progressed since the prior CT and may represent gastritis. Underlying small gastric ulcer is not excluded. 3. Partially visualized bilateral pleural effusions with associated compressive atelectasis of the adjacent lungs. Pneumonia is not excluded. 4. An 8 mm nonobstructing left renal upper pole calculus. 5.  Aortic Atherosclerosis (ICD10-I70.0).     Electronically Signed   By: Vanetta Chou M.D.   On: 07/01/2024 15:52  Assessment & Plan  Assessment:  Melena and  acute blood loss anemia Recent patch repair of a perforated duodenal ulcer at [facility] (further details in yesterday's consult note) Mild coagulopathy with INR 1.42 days ago (probably consumptive) Additional coagulation disorder with abnormal TEG yesterday obtained by surgery and after which she received cryoprecipitate  Bleeding continues, and again no active bleeding seen on the third abdominal CT angiogram today after surgical service asked IR to continue involvement with this workup.  It would seem this patient most likely  has an upper GI bleeding source as indicated in our consult yesterday, and I certainly understand the complexities and risks associated with this entire scenario.  Surgical service is understandably very concerned about the risks of endoscopic insufflation disrupting the duodenal patch repair which could lead to perforation and greater clinical complications.  Therefore at this point, I do not think that our service can be of more assistance at this juncture until enough time is passed that an endoscopy may be considered at more acceptable risk or if she has brisk bleeding with hemodynamic instability.  However, should that occur, there may be a role for another CTA and IR before performing endoscopy.  We will sign off for now but remain available if needed.   Victory LITTIE Brand III Office: [phone]

## 2024-07-01 NOTE — Consult Note (Signed)
 WOC Nurse wound follow up Wound type:unstageable sacral pressure injury Intertriginous breakdown to skin fold on back.  Recent ordes for Dakins moist gauze for debridement and PT for selective sharps debridement. Photo in chart. Splint applied to right leg for comfort with turning during wound assessment and wound care.  Measurement: 6 cm x 5 cm x 3 cm  Wound bed:Red, friable- will stop DAKINS Drainage (amount, consistency, odor) moderate serosanguinous  no odor Periwound:Denuded periwound skin at 8 o'clock consistent with medical adhesive related skin injury.  Intertriginous breakdown to right side back, improving. WIll continue Xeroform.  Dressing procedure/placement/frequency: Cleanse sacral wound with NS and pat dry. Apply Aquacel AG to wound bed.  Cover with dry gauze and sacral silicone foam dressing.  Change every other day and PRN soilage.   Continue Xeroform to skin fold breakdown right back.  WOC Nurse ostomy consult note Stoma type/location: RMQ colostomy Stomal assessment/size: 1 3/8 pink and moist flush  Peristomal assessment: intact Treatment options for stomal/peristomal skin: Barrier ring and 1 piece convex pouch  Output Tarry dark stool Ostomy pouching: 1pc. Convex (LAWSON # [id number]) and barrier ring (LAWSON # [id number])  Education provided: None today.  Will follow.  Darice Cooley MSN, RN, FNP-BC CWON Wound, Ostomy, Continence Nurse Outpatient [facility] [phone] Pager [phone]

## 2024-07-01 NOTE — Plan of Care (Signed)
°  Problem: Health Behavior/Discharge Planning: Goal: Ability to manage health-related needs will improve Outcome: Progressing   Problem: Clinical Measurements: Goal: Ability to maintain clinical measurements within normal limits will improve Outcome: Progressing   Problem: Activity: Goal: Risk for activity intolerance will decrease Outcome: Progressing   Problem: Nutrition: Goal: Adequate nutrition will be maintained Outcome: Progressing   Problem: Elimination: Goal: Will not experience complications related to bowel motility Outcome: Progressing

## 2024-07-01 NOTE — Progress Notes (Signed)
 Pt refused BIPAP at this time. Pt was told if she felt the BIPAP was needed, RT would come back.

## 2024-07-01 NOTE — Progress Notes (Signed)
 Brief IR progress Note:  Request received to review case for need for repeat angiogram/possible repeat embolization GDA due to ongoing bleeding from duodenal ulder.   This patient is s/p ex lap with graham patch repair and JP drain placement for perforated duodenal ulcer, possible bleeding ulcer as well.   S/p 7/3 mesenteric arteriography, including celiac and GDA arteriography, coil embolization of GDA with Dr. Hughes.     Hgb 7.2 from 8.3 yesterday. She did receive a unit of PRBC and cryo on 7/8.   Case review with Dr. Jennefer. Shared with [facility] team that IR plan is to repeat CT angio GI bleed protocol and then make decision.

## 2024-07-01 NOTE — Progress Notes (Signed)
 Progress Note  16 Days Post-Op  Subjective: Pt denies worsening abdominal pain. Ongoing melena per ostomy.   Objective: Vital signs in last 24 hours: Temp:  [97.9 F (36.6 C)-98.6 F (37 C)] 98 F (36.7 C) (07/09 0750) Pulse Rate:  [57-196] 57 (07/09 0750) Resp:  [14-20] 16 (07/09 0750) BP: (87-110)/(45-62) 101/50 (07/09 0750) SpO2:  [94 %-100 %] 94 % (07/09 0814) Weight:  [114.6 kg] 114.6 kg (07/09 0458) Last BM Date : 07/01/24  Intake/Output from previous day: 07/08 0701 - 07/09 0700 In: 1026.3 [P.O.:600; I.V.:13; Blood:413.3] Out: 1705 [Urine:1550; Drains:5; Stool:150] Intake/Output this shift: No intake/output data recorded.  PE: General: WD, morbidly obese female who is laying in bed in NAD Heart: regular, rate, and rhythm. Lungs: Respiratory effort nonlabored Abd: soft, appropriately ttp, ostomy viable with some melanotic stool present, drain serous, incision C/D/I with staples present    Lab Results:  Recent Labs    06/30/24 1630 07/01/24 0329  WBC 9.4 8.3  HGB 8.3* 7.2*  HCT 24.4* 21.9*  PLT 210 200   BMET Recent Labs    06/29/24 0412 07/01/24 0329  NA 133* 134*  K 3.8 3.6  CL 100 100  CO2 27 27  GLUCOSE 82 90  BUN 18 31*  CREATININE 0.87 0.99  CALCIUM  7.6* 7.6*   PT/INR Recent Labs    06/29/24 1538  LABPROT 17.8*  INR 1.4*   CMP     Component Value Date/Time   NA 134 (L) 07/01/2024 0329   K 3.6 07/01/2024 0329   CL 100 07/01/2024 0329   CO2 27 07/01/2024 0329   GLUCOSE 90 07/01/2024 0329   BUN 31 (H) 07/01/2024 0329   CREATININE 0.99 07/01/2024 0329   CALCIUM  7.6 (L) 07/01/2024 0329   PROT 3.9 (L) 07/01/2024 0329   ALBUMIN <1.5 (L) 07/01/2024 0329   AST 15 07/01/2024 0329   ALT 11 07/01/2024 0329   ALKPHOS 47 07/01/2024 0329   BILITOT 0.4 07/01/2024 0329   GFRNONAA >60 07/01/2024 0329   GFRAA >60 06/05/2016 1320   Lipase     Component Value Date/Time   LIPASE 64 (H) 03/28/2016 1705       Studies/Results: CT  ANGIO GI BLEED Result Date: 06/29/2024 CLINICAL DATA:  Upper GI bleed. EXAM: CTA ABDOMEN AND PELVIS WITHOUT AND WITH CONTRAST TECHNIQUE: Multidetector CT imaging of the abdomen and pelvis was performed using the standard protocol during bolus administration of intravenous contrast. Multiplanar reconstructed images and MIPs were obtained and reviewed to evaluate the vascular anatomy. RADIATION DOSE REDUCTION: This exam was performed according to the departmental dose-optimization program which includes automated exposure control, adjustment of the mA and/or kV according to patient size and/or use of iterative reconstruction technique. CONTRAST:  OMNIPAQUE  IOHEXOL  350 MG/ML SOLN COMPARISON:  CT dated 06/24/2024. FINDINGS: VASCULAR Aorta: Moderate atherosclerotic calcification. No aneurysmal dilatation or dissection. Celiac: The celiac artery, splenic, and left gastric artery branches are patent. Coil embolization of the gastroduodenal artery. SMA: The SMA is patent. Renals: The renal arteries are patent. Duplicated right renal artery anatomy. IMA: Diminished opacification of the IMA. Inflow: Mild atherosclerotic calcification. The iliac arteries are patent. No evidence of dilatation or dissection. Proximal Outflow: The visualized proximal outflow is patent. Veins: The IVC is unremarkable. The SMV, splenic vein, and main portal vein are patent. No portal venous gas. Review of the MIP images confirms the above findings. NON-VASCULAR Lower chest: Partially visualized moderate-sized bilateral pleural effusions with compressive atelectasis of the adjacent lungs. No  intra-abdominal free air or free fluid. Hepatobiliary: Mild irregularity of the liver contour may represent early changes of cirrhosis. There is mild biliary dilatation, post cholecystectomy. No retained calcified stone noted in the central CBD. A drainage catheter noted inferior to the liver. No drainable fluid collection noted along the catheter.  Pancreas: Unremarkable. No pancreatic ductal dilatation or surrounding inflammatory changes. Spleen: Normal in size without focal abnormality. Adrenals/Urinary Tract: The adrenal glands are unremarkable. There is an 8 mm nonobstructing left renal upper pole calculus. Duplicated right renal collecting system. A 2.7 x 2.7 cm hypoenhancing area in the inferior pole of the right kidney is new since the prior CT may represent an area of infarct or a lobar nephronia/developing abscess. There is mild right hydronephrosis. The urinary bladder is decompressed around a Foley catheter. Stomach/Bowel: Retained oral contrast noted in the colon. 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 There is sigmoid diverticulosis. There is no bowel obstruction. Evaluation for active GI bleed is very limited due to presence of oral contrast. No extravasated contrast noted to suggest active GI bleed. There is inflammatory changes and thickening of the distal stomach which may represent gastritis or related to a gastric ulcer. Appendectomy. Lymphatic: No adenopathy. Reproductive: Hysterectomy. Other: Large ventral hernia containing portion of the stomach, loops of small bowel and part of colon. There is mild upper mesentery, and periportal edema. Musculoskeletal: Osteopenia with degenerative changes. No acute osseous pathology. IMPRESSION: 1. No evidence of active GI bleed. 2. Inflammatory changes and thickening of the distal stomach may represent gastritis or related to a gastric ulcer. Sigmoid diverticulosis. No bowel obstruction. 3. Partially visualized moderate-sized bilateral pleural effusions with compressive atelectasis of the adjacent lungs. 4. Large bowel containing ventral hernia.  No bowel obstruction. 5. A 2.7 x 2.7 cm hypoenhancing area in the inferior pole of the right kidney is new since the prior CT may represent an area of infarct or a lobar nephronia/developing abscess. There is mild right  hydronephrosis. 6. An 8 mm nonobstructing left renal upper pole calculus. 7.  Aortic Atherosclerosis (ICD10-I70.0). Electronically Signed   By: Vanetta Chou M.D.   On: 06/29/2024 13:34    Anti-infectives: Anti-infectives (From admission, onward)    Start     Dose/Rate Route Frequency Ordered Stop   06/16/24 1800  vancomycin  (VANCOCIN ) IVPB 1000 mg/200 mL premix  Status:  Discontinued        1,000 mg 200 mL/hr over 60 Minutes Intravenous Every 24 hours 06/15/24 1317 06/15/24 1328   06/15/24 1700  vancomycin  (VANCOREADY) IVPB 2000 mg/400 mL  Status:  Discontinued        2,000 mg 200 mL/hr over 120 Minutes Intravenous  Once 06/15/24 1317 06/15/24 1328   06/15/24 1600  fluconazole  (DIFLUCAN ) IVPB 400 mg  Status:  Discontinued        400 mg 100 mL/hr over 120 Minutes Intravenous Every 24 hours 06/15/24 1317 06/16/24 0914   06/15/24 1500  metroNIDAZOLE  (FLAGYL ) IVPB 500 mg  Status:  Discontinued        500 mg 100 mL/hr over 60 Minutes Intravenous Every 12 hours 06/15/24 1216 06/15/24 1255   06/15/24 1400  ciprofloxacin  (CIPRO ) IVPB 400 mg  Status:  Discontinued        400 mg 200 mL/hr over 60 Minutes Intravenous Every 12 hours 06/15/24 1216 06/15/24 1255   06/15/24 1400  piperacillin -tazobactam (ZOSYN ) IVPB 3.375 g        3.375 g 12.5 mL/hr over 240 Minutes Intravenous  Every 8 hours 06/15/24 1317 06/20/24 2359   06/15/24 1400  vancomycin  (VANCOCIN ) IVPB 1000 mg/200 mL premix  Status:  Discontinued        1,000 mg 200 mL/hr over 60 Minutes Intravenous Every 24 hours 06/15/24 1328 06/16/24 0914   06/15/24 1333  vancomycin  (VANCOCIN ) 1-5 GM/200ML-% IVPB       Note to Pharmacy: Romona Shaver E: cabinet override      06/15/24 1333 06/15/24 1410   06/15/24 1221  ciprofloxacin  (CIPRO ) 400 MG/200ML IVPB  Status:  Discontinued       Note to Pharmacy: Dannielle Railing S: cabinet override      06/15/24 1221 06/15/24 1343   06/15/24 1221  metroNIDAZOLE  (FLAGYL ) 500 MG/100ML IVPB  Status:   Discontinued       Note to Pharmacy: Dannielle Railing S: cabinet override      06/15/24 1221 06/15/24 1343   06/12/24 1223  clindamycin  (CLEOCIN ) 900 MG/50ML IVPB       Note to Pharmacy: Elaine Nest L: cabinet override      06/12/24 1223 06/12/24 1332   06/12/24 1131  ceFAZolin  (ANCEF ) 2-4 GM/100ML-% IVPB  Status:  Discontinued       Note to Pharmacy: Billy Sluder H: cabinet override      06/12/24 1131 06/12/24 1434   06/11/24 1300  clindamycin  (CLEOCIN ) IVPB 900 mg  Status:  Discontinued        900 mg 100 mL/hr over 30 Minutes Intravenous On call to O.R. 06/10/24 1041 06/10/24 1447   06/11/24 1300  ceFAZolin  (ANCEF ) IVPB 2g/100 mL premix        2 g 200 mL/hr over 30 Minutes Intravenous On call to O.R. 06/10/24 1447 06/12/24 1908        Assessment/Plan POD 15 , s/p ex lap with graham patch repair and JP drain placement for Perforated duodenal ulcer, possible bleeding ulcer as well, Dr. Johanna at [facility] 6/23, now with melena - IR embolized (7/4) old blood in colostomy noted   - Hgb down 7.2 from 8.3 yesterday,  - h. Pylori negative - will actually leave her surgical drain for right now to assure no leak from necrosis after AE - NPO while undergoing GIB workup - CT GI bleed protocol 7/7- negative for active bleeding but concern for thickening of distal stomach     - I will ask asking IR to re-eval for possible angiogram/repeat embolization to GDA if needed. Ideally would be further out from surgery before any EGD. Appreciate GI eval. - multi-modal pain control - PT, OOB as able pending femur fx recs Coninue BID PPI, I will add carafate .   ABL anemia, due to above - s/p total of 9 u pRBC, got 1 u pRBC 7/8, and 1 cryo 7/8      FEN - NPO VTE - on hold due to anemia ID - [email or web address] , completed    LOS: 24 days   I reviewed hospitalist notes, last 24 h vitals and pain scores, last 48 h intake and output, last 24 h labs and trends, and last 24 h imaging results.  This care  required high  level of medical decision making.    Almarie GORMAN Pringle, [facility] 07/01/2024, 10:51 AM Please see Amion for pager number during day hours 7:00am-4:30pm

## 2024-07-01 NOTE — TOC Progression Note (Signed)
 Transition of Care [facility]) - Progression Note    Patient Details  Name: Christina Castaneda MRN: [id number] Date of Birth: [birth year]  Transition of [facility] CM/SW Contact  Inocente GORMAN Kindle, LCSW Phone Number: 07/01/2024, 8:57 AM  Clinical Narrative:    CSW continuing to follow for medical stability. Patient will require a new insurance authorization for SNF.    Expected Discharge Plan: Skilled Nursing Facility Barriers to Discharge: Continued Medical Work up, Other (must enter comment) (SNF auth pending)  Expected Discharge Plan and Services In-house Referral: Clinical Social Work Discharge Planning Services: CM Consult Post Acute Care Choice: Skilled Nursing Facility Living arrangements for the past 2 months: Single Family Home                                       Social Determinants of Health (SDOH) Interventions SDOH Screenings   Food Insecurity: No Food Insecurity (06/08/2024)  Housing: Low Risk  (06/08/2024)  Transportation Needs: No Transportation Needs (06/08/2024)  Utilities: Not At Risk (06/08/2024)  Social Connections: Unknown (06/08/2024)  Recent Concern: Social Connections - Moderately Isolated (05/27/2024)  Tobacco Use: High Risk (06/25/2024)    Readmission Risk Interventions    05/27/2024   10:01 PM  Readmission Risk Prevention Plan  Post Dischage Appt Complete  Medication Screening Complete  Transportation Screening Complete

## 2024-07-01 NOTE — Progress Notes (Signed)
 Brief IR progress note:  This patient is s/p ex lap with graham patch repair and JP drain placement for perforated duodenal ulcer, possible bleeding ulcer as well.    S/p 7/3 mesenteric arteriography, including celiac and GDA arteriography. There was no obvious active site of bleeding to target,  so empiric coil embolization of GDA with Dr. Hughes was completed.      Hgb 7.2 from 8.3 yesterday. She did receive a unit of PRBC and cryo on 7/8. Continued presence of blood in ostomy.    General surgery contacted IR with concern that arteriogram and possible additional embolization may be needed.   Case review with Dr. Jennefer. Shared with CCS team that IR planned to repeat CT angio GI bleed protocol and then make decision. This was completed this afternoon and does not suggest a current site of active bleeding. It is unlikely at this time that repeat arteriogram would reveal a target site to embolize.   Dr. Jennefer recommends holding this procedure. Communicated with CCS. Please contact IR if blood presence in output increases significantly or the patient become hemodynamically unstable.

## 2024-07-01 NOTE — Progress Notes (Addendum)
 Pt refused CPAP for the 3rd night in a row. CPAP order taken out due to protocol.

## 2024-07-01 NOTE — Plan of Care (Signed)
  Problem: Education: Goal: Knowledge of General Education information will improve Description: Including pain rating scale, medication(s)/side effects and non-pharmacologic comfort measures Outcome: Progressing   Problem: Health Behavior/Discharge Planning: Goal: Ability to manage health-related needs will improve Outcome: Progressing   Problem: Clinical Measurements: Goal: Ability to maintain clinical measurements within normal limits will improve Outcome: Progressing Goal: Will remain free from infection Outcome: Progressing Goal: Diagnostic test results will improve Outcome: Progressing Goal: Respiratory complications will improve Outcome: Progressing Goal: Cardiovascular complication will be avoided Outcome: Progressing   Problem: Activity: Goal: Risk for activity intolerance will decrease Outcome: Progressing   Problem: Nutrition: Goal: Adequate nutrition will be maintained Outcome: Progressing   Problem: Coping: Goal: Level of anxiety will decrease Outcome: Progressing   Problem: Elimination: Goal: Will not experience complications related to bowel motility Outcome: Progressing Goal: Will not experience complications related to urinary retention Outcome: Progressing   Problem: Pain Managment: Goal: General experience of comfort will improve and/or be controlled Outcome: Progressing   Problem: Safety: Goal: Ability to remain free from injury will improve Outcome: Progressing   Problem: Skin Integrity: Goal: Risk for impaired skin integrity will decrease Outcome: Progressing   Problem: Safety: Goal: Non-violent Restraint(s) Outcome: Progressing   Problem: Activity: Goal: Ability to tolerate increased activity will improve Outcome: Progressing   Problem: Respiratory: Goal: Ability to maintain a clear airway and adequate ventilation will improve Outcome: Progressing   Problem: Role Relationship: Goal: Method of communication will improve Outcome:  Progressing   Problem: Education: Goal: Understanding of CV disease, CV risk reduction, and recovery process will improve Outcome: Progressing Goal: Individualized Educational Video(s) Outcome: Progressing   Problem: Activity: Goal: Ability to return to baseline activity level will improve Outcome: Progressing   Problem: Cardiovascular: Goal: Ability to achieve and maintain adequate cardiovascular perfusion will improve Outcome: Progressing Goal: Vascular access site(s) Level 0-1 will be maintained Outcome: Progressing   Problem: Health Behavior/Discharge Planning: Goal: Ability to safely manage health-related needs after discharge will improve Outcome: Progressing

## 2024-07-01 NOTE — Progress Notes (Signed)
 VAST RN to patient's bedside to draw scheduled lab from PICC and provide line care. Lab collected from red lumen of RA DL PICC with great blood return; easy to flush with NS. Purple lumen without blood return and completely occluded. VAST RN changed needleless cap and attempted other troubleshooting measures without success. Upon review of chart and speaking with unit RN, red lumen was used for morning morphine  dose and flushed easily.  Patient received CT scan with Omnipaque  at 1500 this afternoon, but it was not documented which lumen was used. Reached out to CT staff who could not recall which lumen was used, but reported excellent blood return. CT verbalized that their practice is to flush with 40mLs NS prior to Omnipaque  followed by 40mLs NS.  VAST RN contacted pharmacy to determine if another agent should be used to unblock/declot line. Pharmacist reported there is not any special medication to declot with Omnipaque ; suggested alteplase  or heparin . Alteplase  ordered.

## 2024-07-01 NOTE — Progress Notes (Signed)
 Nutrition Brief Note  Given her extensive surgical hx this hospitalization, risk of malnutrition, and presence of wounds complicated by prolonged poor intake, would recommend TPN.  Patient's diet still not advanced beyond clear liquid diet today. Messaged surgery with recommendation for TPN. PA-C to follow up with MD and order, if amicable. Did receive TPN from 6/26-6/30 and has not consistently or substantially met her estimated calorie/protein needs since that time.   Not likely a good candidate for J-tube feeding via Cortrak as it would have to be placed past a recent surgical site. Will monitor for TPN initiation and give recommendations if/when appropriate.   INTERVENTION:   Recommend TPN given her extensive surgical hx this hospitalization, risk of malnutrition, and presence of wounds complicated by prolonged poor intake Monitor diet advancement and tolerance, recommend DYS3 diet when advanced for ease of consumption Modify to Boost Breeze po TID, each supplement provides 250 kcal and 9 grams of protein  Continue MVI w/ minerals When appropriate, continue Magic cup TID with meals, each supplement provides 290 kcal and 9 grams of protein      NUTRITION DIAGNOSIS:  Increased nutrient needs related to acute illness, wound healing as evidenced by estimated needs. - remains applicable   GOAL:  Patient will meet greater than or equal to 90% of their needs - progressing   MONITOR:  Diet advancement, Labs, Weight trends, I & O's, Skin   Blair Deaner MS, RD, LDN Registered Dietitian Clinical Nutrition RD Inpatient Contact Info in Amion

## 2024-07-02 DIAGNOSIS — K276 Chronic or unspecified peptic ulcer, site unspecified, with both hemorrhage and perforation: Secondary | ICD-10-CM | POA: Diagnosis not present

## 2024-07-02 DIAGNOSIS — J449 Chronic obstructive pulmonary disease, unspecified: Secondary | ICD-10-CM | POA: Diagnosis not present

## 2024-07-02 DIAGNOSIS — G4733 Obstructive sleep apnea (adult) (pediatric): Secondary | ICD-10-CM | POA: Diagnosis not present

## 2024-07-02 DIAGNOSIS — N179 Acute kidney failure, unspecified: Secondary | ICD-10-CM | POA: Diagnosis not present

## 2024-07-02 LAB — CBC
HCT: 24.7 % — ABNORMAL LOW (ref 36.0–46.0)
HCT: 25.2 % — ABNORMAL LOW (ref 36.0–46.0)
HCT: 25.6 % — ABNORMAL LOW (ref 36.0–46.0)
Hemoglobin: 8.1 g/dL — ABNORMAL LOW (ref 12.0–15.0)
Hemoglobin: 8.1 g/dL — ABNORMAL LOW (ref 12.0–15.0)
Hemoglobin: 8.3 g/dL — ABNORMAL LOW (ref 12.0–15.0)
MCH: 30.7 pg (ref 26.0–34.0)
MCH: 30.2 pg (ref 26.0–34.0)
MCH: 30.6 pg (ref 26.0–34.0)
MCHC: 32.8 g/dL (ref 30.0–36.0)
MCHC: 32.1 g/dL (ref 30.0–36.0)
MCHC: 32.4 g/dL (ref 30.0–36.0)
MCV: 93.6 fL (ref 80.0–100.0)
MCV: 94 fL (ref 80.0–100.0)
MCV: 94.5 fL (ref 80.0–100.0)
Platelets: 199 K/uL (ref 150–400)
Platelets: 212 K/uL (ref 150–400)
Platelets: 210 K/uL (ref 150–400)
RBC: 2.64 MIL/uL — ABNORMAL LOW (ref 3.87–5.11)
RBC: 2.68 MIL/uL — ABNORMAL LOW (ref 3.87–5.11)
RBC: 2.71 MIL/uL — ABNORMAL LOW (ref 3.87–5.11)
RDW: 17.5 % — ABNORMAL HIGH (ref 11.5–15.5)
RDW: 17.4 % — ABNORMAL HIGH (ref 11.5–15.5)
RDW: 17.2 % — ABNORMAL HIGH (ref 11.5–15.5)
WBC: 5.9 K/uL (ref 4.0–10.5)
WBC: 5.8 K/uL (ref 4.0–10.5)
WBC: 9.1 K/uL (ref 4.0–10.5)
nRBC: 0 % (ref 0.0–0.2)
nRBC: 0 % (ref 0.0–0.2)
nRBC: 0 % (ref 0.0–0.2)

## 2024-07-02 LAB — BPAM RBC
Blood Product Expiration Date: 202507282359
Blood Product Expiration Date: 202508052359
Blood Product Expiration Date: 202508062359
Blood Product Expiration Date: 202508072359
ISSUE DATE / TIME: 202507061214
ISSUE DATE / TIME: 202507071812
ISSUE DATE / TIME: 202507080833
ISSUE DATE / TIME: 202507092013
Unit Type and Rh: 5100
Unit Type and Rh: 5100
Unit Type and Rh: 5100
Unit Type and Rh: 5100

## 2024-07-02 LAB — TYPE AND SCREEN
ABO/RH(D): O POS
Antibody Screen: NEGATIVE
Unit division: 0
Unit division: 0
Unit division: 0
Unit division: 0

## 2024-07-02 LAB — BASIC METABOLIC PANEL WITH GFR
Anion gap: 6 (ref 5–15)
BUN: 19 mg/dL (ref 8–23)
CO2: 28 mmol/L (ref 22–32)
Calcium: 7.6 mg/dL — ABNORMAL LOW (ref 8.9–10.3)
Chloride: 100 mmol/L (ref 98–111)
Creatinine, Ser: 0.98 mg/dL (ref 0.44–1.00)
GFR, Estimated: >60 mL/min (ref >60)
Glucose, Bld: 79 mg/dL (ref 70–99)
Potassium: 3.4 mmol/L — ABNORMAL LOW (ref 3.5–5.1)
Sodium: 134 mmol/L — ABNORMAL LOW (ref 135–145)

## 2024-07-02 LAB — TSH: TSH: 39.53 u[IU]/mL — ABNORMAL HIGH (ref 0.350–4.500)

## 2024-07-02 MED ORDER — POTASSIUM CHLORIDE CRYS ER 20 MEQ PO TBCR
40.0000 meq | EXTENDED_RELEASE_TABLET | Freq: Once | ORAL | Status: AC
  Administered 2024-07-02: 40 meq via ORAL
  Filled 2024-07-02: qty 2

## 2024-07-02 MED ORDER — VASOPRESSIN 20 UNIT/ML IV SOLN
0.2000 [IU]/min | INTRAVENOUS | Status: AC
  Administered 2024-07-02 – 2024-07-03 (×6): 0.2 [IU]/min via INTRAVENOUS
  Filled 2024-07-02 (×6): qty 2.5

## 2024-07-02 MED ORDER — LEVOTHYROXINE SODIUM 100 MCG PO TABS
200.0000 ug | ORAL_TABLET | Freq: Every day | ORAL | Status: DC
  Administered 2024-07-03 – 2024-07-04 (×2): 200 ug via ORAL
  Filled 2024-07-02 (×2): qty 2

## 2024-07-02 NOTE — Progress Notes (Signed)
 PROGRESS NOTE        PATIENT DETAILS Name: Christina Castaneda Age: 68 y.o. Sex: female Date of Birth: August 18, 1956 Admit Date: 06/07/2024 Admitting Physician Orlin Fairly, MD ERE:Floopd, Mariano SQUIBB, DO  Brief Summary: Patient is a 68 y.o.  female with history of perforated sigmoid colon-s/p Hartman's/colostomy 2017-who initially presented to [facility] following a mechanical fall-she was found to have left distal femur fracture-underwent retrograde femoral nail on 6/20, unfortunately-further hospital course complicated by perforated duodenal ulcer with acute blood loss anemia resulting in hypovolemic shock-she was evaluated by general surgery and underwent emergent exploratory laparotomy with Graham's patch-postoperatively-she was transferred to [facility] ICU.  She was stabilized and then subsequently transferred to [facility] service.  Unfortunately-further hospital course complicated by recurrent melena through ostomy and acute blood loss anemia-after discussion with CCS and then with interventional radiology-patient underwent on GDA embolization on 7/4.  See below for further details.   Significant events: 6/15>> mechanical fall-left femur fracture/AKI-admit to [facility] at [facility]. 6/20>> retrograde femoral nail by orthopedics at [facility] 6/23>> perforated duodenal ulcer-hypotension-exploratory laparotomy/placement of Arlyss supplies.  Remained intubated postprocedure-transferred to [facility] ICU. 6/24>> extubated 6/25>> off pressors-out of ICU-drop in hemoglobin but nonbloody ostomy output. 6/26>> transferred to [facility] service 7/02>> Black tarry stools in ostomy-Hb down to 6.1-General Surgery reconsulted-too early for EGD-CT angio negative for bleed-IR deferred embolization 7/03 >> continues to have black tarry stools-IR performed GDA embolization. 7/06>>  Hb 6.8-continues to have black stools in ostomy-1 unit of blood ordered. 7/07>> Hb 6.7-CT angiogram negative-1 unit of blood ordered. 7/08>> Hb 6.9-1 unit of  blood ordered.  GI consulted.  CCS discussed with IR-no further embolization options-GDA completely occluded on prior study.  Extensive discussion with CCS/GI-CCS felt, risk of endoscopy was significant-patient felt to be at significant risk of perforation-with no real good operative fix for recurrent perf.  CCS ordered cryoprecipitate.  CCS opined that EGD be reserved only for hemodynamically unstable bleed or consider EGD once patient was at least 1 month out from laparotomy. 7/09>> IR ordered repeat CTA-no targets for embolization as no obvious bleeding.  1 additional PRBC transfusion for Hb of 7.0.  Protonix  dosage increased to 80 mg IV twice daily x 3 days before switching back to 40 mg twice daily.  Significant studies: 6/15>> CT left knee: Acute comminuted fracture of distal left femoral shaft. 6/16>> renal ultrasound: No hydronephrosis 6/23>> CT angiogram abdomen: Perforated and bleeding peptic ulcer disease affecting proximal duodenum-appears to be hematoma in the gastric antrum/proximal duodenum. 6/26>> upper GI series with KUB: No evidence of contrast leak. 7/02>> CT angio GI bleed study: No active bleeding. 7/07>> CT angio GI bleed study: No active bleeding 7/09>> CT angio GI bleed; no active bleeding  Significant microbiology data: 6/23>> blood culture: No growth 6/23>> peritoneal fluid: Abundant Candida albicans/moderate Candida glabrata.  Procedures: 6/20>> retrograde femoral nail by orthopedics at [facility] 6/23>> exploratory laparotomy with omental Graham's patch repair 7/03>> GDA embolization.  Consults: GI at [facility] at [facility] and [facility]  Subjective: Unchanged-melanotic appearing stools in ostomy bag.  No abdominal pain.  Objective: Vitals: Blood pressure (!) 106/57, pulse (!) 58, temperature 98.8 F (37.1 C), temperature source Oral, resp. rate 16, height 5' 5 (1.651 m), weight 105 kg, last menstrual period 11/18/2012, SpO2 100%.    Exam: Awake/alert Chest: Clear to auscultation CVS: S1-S2 regular Abdomen: Soft nontender-Black  stools in ostomy Extremities: Trace edema Neurology: Nonfocal but with generalized weakness.  Pertinent Labs/Radiology:    Latest Ref Rng & Units 07/02/2024    4:51 AM 07/02/2024    1:37 AM 07/01/2024    5:13 PM  CBC  WBC 4.0 - 10.5 K/uL 5.8  5.9  6.4   Hemoglobin 12.0 - 15.0 g/dL 8.1  8.1  7.0   Hematocrit 36.0 - 46.0 % 25.2  24.7  21.8   Platelets 150 - 400 K/uL 212  199  194     Lab Results  Component Value Date   NA 134 (L) 07/02/2024   K 3.4 (L) 07/02/2024   CL 100 07/02/2024   CO2 28 07/02/2024     Assessment/Plan: Hemorrhagic and septic shock secondary to perforated bleeding duodenal ulcer with acute blood loss anemia-s/p exploratory laparotomy with Arlyss patch repair on 6/23 at [facility] Recurrent upper GI bleeding with acute blood loss anemia noted on 7/2-CTA negative for extravasation x2-s/p GDA embolization by IR on 7/4. Had stabilized after laparotomy with Arlyss patch repair-however unfortunately developed upper GI bleed-with melanotic stool on 7/2-multiple units of PRBC transfused-CTA was negative for GI bleed-IR was subsequently consulted-underwent gastroduodenal artery embolization on 7/3. Unfortunately-even with embolization (IR-Dr Shick reviewed images on 7/8-GDA completely occluded on prior embolization on 7/4-no further embolization options) -patient continues to have melanotic stools-has required multiple units of PRBC (12 units of PRBC so far).  Multiple repeat CT angiograms negative so far  GI opinion was sought on 7/8-tentative plans for EGD were made-however general surgery-Dr. Bruce that it was too early for EGD and that risk of recurrent duodenal perforation was very high with insufflation during EGD.  Unfortunately if she were to perforate, she really did not have good surgical options.  It was felt that EGD was to be reserved for a scenario where patient  developed hemodynamically significant bleeding or it potentially could be done for continued bleeding if patient was at least 1 month out from her laparotomy.   As noted above-she continues to have melanotic appearing stools-Hb has stabilized since last transfusion on 7/9-continue to monitor closely. High-dose PPI-Protonix  80 mg twice daily x 3 days-on 7/9-before switching back to 40 mg twice daily.  Mechanical fall with left femur fracture-s/p retrograde left femoral nail on 6/20 Per orthopedics-WBAT LLE, knee immobilizer when ambulating, remove staples in 2 weeks.  Follow-up with Dr. Oneil Salvage at [place] 2 weeks from discharge. High risk for VTE-unfortunately unable to use pharmacological prophylaxis-due to above situation.  AKI Likely hemodynamically mediated Resolved  Acute urinary retention Foley catheter remains in place-will perform voiding trial in the next several days-if no further surgical procedures are planned.  HTN BP soft-continue to hold all antihypertensives  HLD Continue to hold Crestor -resume when able-currently n.p.o.  COPD Stable-no wheezing Continue bronchodilators  OSA CPAP nightly  Hypothyroidism Synthroid   History of perforated sigmoid colon-s/p Hartman/colostomy 2017  Debility/deconditioning PT/OT eval-SNF recommended.  Nutrition Status: Nutrition Problem: Increased nutrient needs Etiology: acute illness, wound healing Signs/Symptoms: estimated needs Interventions: Refer to RD note for recommendations   Pressure Ulcer: Agree with assessment and plan as outlined below. Pressure Injury 05/27/24 Sacrum Stage 2 -  Partial thickness loss of dermis presenting as a shallow open injury with a red, pink wound bed without slough. 4 small stage II present, each measures 0.4x 0.4 x 0.2 (Active)  05/27/24 1815  Location: Sacrum  Location Orientation:   Staging: Stage 2 -  Partial thickness loss of dermis presenting as a  shallow open injury with  a red, pink wound bed without slough.  Wound Description (Comments): 4 small stage II present, each measures 0.4x 0.4 x 0.2  DO NOT USE:  Present on Admission:   Dressing Type Foam - Lift dressing to assess site every shift 06/29/24 1637    Morbid Obesity: Estimated body mass index is 38.52 kg/m as calculated from the following:   Height as of this encounter: 5' 5 (1.651 m).   Weight as of this encounter: 105 kg.   Code status:   Code Status: Full Code   DVT Prophylaxis: Place and maintain sequential compression device Start: 06/18/24 1325 SCDs Start: 06/15/24 1937 Place and maintain sequential compression device Start: 06/15/24 0155 SQ heparin  discontinued 7/2-due to high GI bleeding with ABLA.  Family Communication: Spouse [phone]-updated 7/9-updated him in great detail over the phone in regards to above situation with ongoing bleed.   Disposition Plan: Status is: Inpatient Remains inpatient appropriate because: Severity of illness   Planned Discharge Destination:Skilled nursing facility   Diet: Diet Order             Diet clear liquid Fluid consistency: Thin  Diet effective now                     Antimicrobial agents: Anti-infectives (From admission, onward)    Start     Dose/Rate Route Frequency Ordered Stop   06/16/24 1800  vancomycin  (VANCOCIN ) IVPB 1000 mg/200 mL premix  Status:  Discontinued        1,000 mg 200 mL/hr over 60 Minutes Intravenous Every 24 hours 06/15/24 1317 06/15/24 1328   06/15/24 1700  vancomycin  (VANCOREADY) IVPB 2000 mg/400 mL  Status:  Discontinued        2,000 mg 200 mL/hr over 120 Minutes Intravenous  Once 06/15/24 1317 06/15/24 1328   06/15/24 1600  fluconazole  (DIFLUCAN ) IVPB 400 mg  Status:  Discontinued        400 mg 100 mL/hr over 120 Minutes Intravenous Every 24 hours 06/15/24 1317 06/16/24 0914   06/15/24 1500  metroNIDAZOLE  (FLAGYL ) IVPB 500 mg  Status:  Discontinued        500 mg 100 mL/hr over 60  Minutes Intravenous Every 12 hours 06/15/24 1216 06/15/24 1255   06/15/24 1400  ciprofloxacin  (CIPRO ) IVPB 400 mg  Status:  Discontinued        400 mg 200 mL/hr over 60 Minutes Intravenous Every 12 hours 06/15/24 1216 06/15/24 1255   06/15/24 1400  piperacillin -tazobactam (ZOSYN ) IVPB 3.375 g        3.375 g 12.5 mL/hr over 240 Minutes Intravenous Every 8 hours 06/15/24 1317 06/20/24 2359   06/15/24 1400  vancomycin  (VANCOCIN ) IVPB 1000 mg/200 mL premix  Status:  Discontinued        1,000 mg 200 mL/hr over 60 Minutes Intravenous Every 24 hours 06/15/24 1328 06/16/24 0914   06/15/24 1333  vancomycin  (VANCOCIN ) 1-5 GM/200ML-% IVPB       Note to Pharmacy: Romona Shaver E: cabinet override      06/15/24 1333 06/15/24 1410   06/15/24 1221  ciprofloxacin  (CIPRO ) 400 MG/200ML IVPB  Status:  Discontinued       Note to Pharmacy: Dannielle Railing S: cabinet override      06/15/24 1221 06/15/24 1343   06/15/24 1221  metroNIDAZOLE  (FLAGYL ) 500 MG/100ML IVPB  Status:  Discontinued       Note to Pharmacy: Dannielle Railing S: cabinet override      06/15/24 1221 06/15/24  1343   06/12/24 1223  clindamycin  (CLEOCIN ) 900 MG/50ML IVPB       Note to Pharmacy: Elaine Nest L: cabinet override      06/12/24 1223 06/12/24 1332   06/12/24 1131  ceFAZolin  (ANCEF ) 2-4 GM/100ML-% IVPB  Status:  Discontinued       Note to Pharmacy: Billy Sluder H: cabinet override      06/12/24 1131 06/12/24 1434   06/11/24 1300  clindamycin  (CLEOCIN ) IVPB 900 mg  Status:  Discontinued        900 mg 100 mL/hr over 30 Minutes Intravenous On call to O.R. 06/10/24 1041 06/10/24 1447   06/11/24 1300  ceFAZolin  (ANCEF ) IVPB 2g/100 mL premix        2 g 200 mL/hr over 30 Minutes Intravenous On call to O.R. 06/10/24 1447 06/12/24 1908        MEDICATIONS: Scheduled Meds:  arformoterol   15 mcg Nebulization Q12H   budesonide  (PULMICORT ) nebulizer solution  0.5 mg Nebulization BID   Chlorhexidine  Gluconate Cloth  6 each  Topical Daily   diphenhydrAMINE   25 mg Intravenous Once   feeding supplement  237 mL Oral BID BM   levothyroxine   150 mcg Oral Q0600   multivitamin with minerals  1 tablet Oral Daily   nystatin    Topical BID   [START ON 07/04/2024] pantoprazole  (PROTONIX ) IV  40 mg Intravenous Q12H   pantoprazole  (PROTONIX ) IV  80 mg Intravenous Q12H   sodium chloride  flush  10-40 mL Intracatheter Q12H   sodium chloride  flush  3 mL Intravenous Q12H   sucralfate   1 g Oral TID WC & HS   Continuous Infusions: PRN Meds:.dextrose , iohexol , ipratropium-albuterol , magic mouthwash w/lidocaine , morphine  injection, naLOXone  (NARCAN )  injection, [DISCONTINUED] ondansetron  **OR** ondansetron  (ZOFRAN ) IV, mouth rinse, oxyCODONE , polyethylene glycol, prochlorperazine , sodium chloride  flush, sodium chloride  flush   I have personally reviewed following labs and imaging studies  LABORATORY DATA: CBC: Recent Labs  Lab 06/30/24 1630 07/01/24 0329 07/01/24 1713 07/02/24 0137 07/02/24 0451  WBC 9.4 8.3 6.4 5.9 5.8  HGB 8.3* 7.2* 7.0* 8.1* 8.1*  HCT 24.4* 21.9* 21.8* 24.7* 25.2*  MCV 90.7 94.4 94.8 93.6 94.0  PLT 210 200 194 199 212    Basic Metabolic Panel: Recent Labs  Lab 06/26/24 0445 06/27/24 0428 06/29/24 0412 07/01/24 0329 07/02/24 0451  NA 136 134* 133* 134* 134*  K 3.6 3.6 3.8 3.6 3.4*  CL 103 102 100 100 100  CO2 24 24 27 27 28   GLUCOSE 76 112* 82 90 79  BUN 40* 36* 18 31* 19  CREATININE 1.18* 1.04* 0.87 0.99 0.98  CALCIUM  7.8* 8.1* 7.6* 7.6* 7.6*    GFR: Estimated Creatinine Clearance: 64.3 mL/min (by C-G formula based on SCr of 0.98 mg/dL).  Liver Function Tests: Recent Labs  Lab 07/01/24 0329  AST 15  ALT 11  ALKPHOS 47  BILITOT 0.4  PROT 3.9*  ALBUMIN <1.5*    No results for input(s): LIPASE, AMYLASE in the last 168 hours. No results for input(s): AMMONIA in the last 168 hours.  Coagulation Profile: Recent Labs  Lab 06/29/24 1538  INR 1.4*    Cardiac  Enzymes: No results for input(s): CKTOTAL, CKMB, CKMBINDEX, TROPONINI in the last 168 hours.  BNP (last 3 results) No results for input(s): PROBNP in the last 8760 hours.  Lipid Profile: No results for input(s): CHOL, HDL, LDLCALC, TRIG, CHOLHDL, LDLDIRECT in the last 72 hours.   Thyroid  Function Tests: No results for input(s): TSH, T4TOTAL, FREET4, T3FREE,  THYROIDAB in the last 72 hours.  Anemia Panel: No results for input(s): VITAMINB12, FOLATE, FERRITIN, TIBC, IRON, RETICCTPCT in the last 72 hours.  Urine analysis:    Component Value Date/Time   COLORURINE YELLOW 06/07/2024 2026   APPEARANCEUR HAZY (A) 06/07/2024 2026   LABSPEC 1.015 06/07/2024 2026   PHURINE 5.0 06/07/2024 2026   GLUCOSEU NEGATIVE 06/07/2024 2026   HGBUR NEGATIVE 06/07/2024 2026   BILIRUBINUR NEGATIVE 06/07/2024 2026   KETONESUR NEGATIVE 06/07/2024 2026   PROTEINUR NEGATIVE 06/07/2024 2026   NITRITE NEGATIVE 06/07/2024 2026   LEUKOCYTESUR NEGATIVE 06/07/2024 2026    Sepsis Labs: Lactic Acid, Venous    Component Value Date/Time   LATICACIDVEN 1.0 05/27/2024 1945    MICROBIOLOGY: No results found for this or any previous visit (from the past 240 hours).   RADIOLOGY STUDIES/RESULTS: CT ANGIO GI BLEED Result Date: 07/01/2024 CLINICAL DATA:  Evaluate for GI bleed. EXAM: CTA ABDOMEN AND PELVIS WITHOUT AND WITH CONTRAST TECHNIQUE: Multidetector CT imaging of the abdomen and pelvis was performed using the standard protocol during bolus administration of intravenous contrast. Multiplanar reconstructed images and MIPs were obtained and reviewed to evaluate the vascular anatomy. RADIATION DOSE REDUCTION: This exam was performed according to the departmental dose-optimization program which includes automated exposure control, adjustment of the mA and/or kV according to patient size and/or use of iterative reconstruction technique. CONTRAST:  OMNIPAQUE  IOHEXOL  350  MG/ML SOLN COMPARISON:  CT dated 06/29/2024. FINDINGS: VASCULAR Aorta: Moderate atherosclerotic calcification. No aneurysmal dilatation or dissection. No periaortic fluid collection. Celiac: The celiac artery and its major branches are patent. Coil embolization of the GDA. SMA: Atherosclerotic calcification of the origin of the SMA. The SMA remains patent. Renals: Atherosclerotic calcification of the renal arteries. The renal arteries are patent. Duplicated right renal artery anatomy. IMA: The IMA is patent. Inflow: Mild atherosclerotic calcification. No aneurysmal dilatation or dissection. The iliac arteries are patent. Proximal Outflow: The visualized proximal outflows patent. Veins: The IVC is unremarkable. The SMV, splenic vein, and main portal vein are patent no portal venous gas. Review of the MIP images confirms the above findings. NON-VASCULAR Lower chest: Partially visualized bilateral pleural effusions with associated compressive atelectasis of the adjacent lungs. Pneumonia is not excluded. No intra-abdominal free air.  Trace perihepatic ascites. Hepatobiliary: Slight irregularity of the liver contour may represent changes of cirrhosis. Clinical correlation recommended. Cholecystectomy. No retained calcified stone noted in the central CBD. JP drainage catheter in similar position along the inferior surface of the liver. Trace fluid noted adjacent to the catheter. Pancreas: Unremarkable. No pancreatic ductal dilatation or surrounding inflammatory changes. Spleen: Normal in size without focal abnormality. Adrenals/Urinary Tract: The adrenal glands unremarkable. An 8 mm nonobstructing left renal upper pole calculus. Duplicated right renal collecting system. Similar appearance of a hypoenhancing area in the inferior pole of the right kidney as the prior CT. This may represent an area of infarct or lobar nephronia/developing abscess. Underlying mass is not excluded. Attention on follow-up imaging recommended.  Mild right hydronephrosis. The urinary bladder is decompressed around a Foley catheter. Stomach/Bowel: There is diffuse thickening of the distal stomach, slightly progressed since the prior CT and may represent gastritis. Underlying small gastric ulcer is not excluded. There is contrast in the proximal duodenum which appears relatively unchanged on the precontrast and postcontrast images and may represent retained oral contrast seen within the stomach on the prior CT. No evidence of active GI bleed on today's exam. Postsurgical changes of the bowel with right anterior colostomy. There  is sigmoid diverticulosis. Oral contrast noted in the proximal colon extends into the colostomy. No bowel obstruction. Appendectomy. Lymphatic: No adenopathy. Reproductive: Hysterectomy. Other: Large ventral hernia containing portion of the stomach, large and small bowel. Musculoskeletal: Osteopenia with degenerative changes. Left femoral nail. No acute osseous pathology. Sacral decubitus ulcer. No fluid collection. IMPRESSION: 1. No evidence of active GI bleed on today's exam. 2. Diffuse thickening of the distal stomach, slightly progressed since the prior CT and may represent gastritis. Underlying small gastric ulcer is not excluded. 3. Partially visualized bilateral pleural effusions with associated compressive atelectasis of the adjacent lungs. Pneumonia is not excluded. 4. An 8 mm nonobstructing left renal upper pole calculus. 5.  Aortic Atherosclerosis (ICD10-I70.0). Electronically Signed   By: Vanetta Chou M.D.   On: 07/01/2024 15:52      LOS: 25 days   Donalda Applebaum, MD  [facility]    To contact the attending provider between 7A-7P or the covering provider during after hours 7P-7A, please log into the web site [email or web address] and access using universal [place] password for that web site. If you do not have the password, please call the hospital operator.  07/02/2024, 11:40 AM

## 2024-07-02 NOTE — Plan of Care (Signed)
   Problem: Education: Goal: Knowledge of General Education information will improve Description: Including pain rating scale, medication(s)/side effects and non-pharmacologic comfort measures Outcome: Progressing   Problem: Clinical Measurements: Goal: Ability to maintain clinical measurements within normal limits will improve Outcome: Progressing Goal: Respiratory complications will improve Outcome: Progressing Goal: Cardiovascular complication will be avoided Outcome: Progressing   Problem: Activity: Goal: Risk for activity intolerance will decrease Outcome: Progressing   Problem: Nutrition: Goal: Adequate nutrition will be maintained Outcome: Progressing

## 2024-07-02 NOTE — Progress Notes (Signed)
 TRH night cross cover note:   Per existing order for nursing notification parameters, I was notified by the patient's RN of the patient's sustained heart rates in the 50's, with HR's occasionally decreasing into the 40's in non-sustained fashion, with low HR over the last 12 hours noted to be 38. RN conveys similar HR's on day shift. Patient is currently on vasopressin  drip for anticipated duration of 24 hours in setting of GIB, with termination of drip  anticipated to occur around 1400 on 07/03/2024. Potential for some reflex bradycardia in setting of vasoconstriction from the vasopressin  drip. However, BP remains stable, with SBP's in the 120's - 130's mmHg. Other VS appear stable, including afebrile, respiratory rate 16-20.  Additionally, patient remains on 4 L nasal cannula, compared to 3-4 L Tensed during day shift, with most recent oxygen saturations in the range of 97 to 98%.   RN conveys that the patient is asleep at this time, but easily awakes to verbal stimuli and is without acute complaint at this time. Labs ordered this afternoon include cbc, which was notable for hemoglobin 8.3, which is increased from most recent prior value of 8.1 on the morning of 07/02/2024.  Additionally, BMP drawn on the morning of 07/02/2024 demonstrated potassium level of 3.4, which prompted supplementation with potassium chloride  40 meq PO x 1 dose.  Additionally, TSH ordered during dayshift was found to be 39.5, up from 12.7 when checked in 2017.  Per my brief chart review, it appears that the patient has been on Synthroid  150 mcg p.o. qam  since at least 6/28.  In light of the persistent elevation in her TSH while on this dose of Synthroid , I subsequently increased dose of Synthroid  to 200 mcg po qam, with first dose to occur tomorrow morning.  It does not appear that the patient is on any dedicated AV nodal blocking agents at this time.  Given stable BP's and the asymptomatic nature of the patient, will continue to monitor  ensuing VS closely.      Eva Pore, DO Hospitalist

## 2024-07-02 NOTE — Progress Notes (Signed)
 Physical Therapy Treatment Patient Details Name: Christina Castaneda MRN: [id number] DOB: 1956/04/30 Today's Date: 07/02/2024   History of Present Illness Pt is a 68 y/o F presenting to [facility] ED on 6/15 after fall, found to have L femoral neck fx and AKI. S/p retrograde IMN on 6/20. Hgb drop on 6/23 with dark ostomy output, transfused, subsequent hypotension requiring pressors. Found to have perforated bleeding duodenal ulcer s/p ex lap on 6/23. Transferred to [facility] for PCCM. 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    PT Comments  Pt with poor tolerance to treatment today. Pt initially only agreeable to bed level exercise however after 1 set of SLR pt requested PT to adjust knee immobilizer. As this therapist adjusted knee immobilizer pt became very anxious and screamed at this therapist that she can't do it anymore and immediately requested termination of session. No change in DC/DME recs at this time. PT will continue to follow.     If plan is discharge home, recommend the following: Two people to help with walking and/or transfers;Two people to help with bathing/dressing/bathroom   Can travel by private vehicle     No  Equipment Recommendations  None recommended by PT    Recommendations for Other Services       Precautions / Restrictions Precautions Precautions: Fall;Other (comment) Recall of Precautions/Restrictions: Intact Precaution/Restrictions Comments: JP drain, colostomy, sacral wound Required Braces or Orthoses: Knee Immobilizer - Left Knee Immobilizer - Left: On when out of bed or walking Restrictions Weight Bearing Restrictions Per Provider Order: Yes LLE Weight Bearing Per Provider Order: Weight bearing as tolerated Other Position/Activity Restrictions: No L knee ROM restrictions     Mobility  Bed Mobility               General bed mobility comments: pt declined    Transfers                   General  transfer comment: declined    Ambulation/Gait                   Stairs             Wheelchair Mobility     Tilt Bed    Modified Rankin (Stroke Patients Only)       Balance                                            Communication Communication Communication: No apparent difficulties  Cognition Arousal: Alert Behavior During Therapy: Flat affect, Anxious, Restless                           PT - Cognition Comments: Pt very anxious with mobility Following commands: Intact      Cueing Cueing Techniques: Verbal cues  Exercises General Exercises - Lower Extremity Straight Leg Raises: AROM, Right, 5 reps, Supine    General Comments General comments (skin integrity, edema, etc.): VSS      Pertinent Vitals/Pain Pain Assessment Pain Assessment: Faces Faces Pain Scale: Hurts even more Pain Location: LLE with movement Pain Descriptors / Indicators: Guarding, Grimacing, Sore Pain Intervention(s): Limited activity within patient's tolerance, Repositioned    Home Living  Prior Function            PT Goals (current goals can now be found in the care plan section) Progress towards PT goals: Not progressing toward goals - comment    Frequency    Min 1X/week      PT Plan      Co-evaluation              AM-PAC PT 6 Clicks Mobility   Outcome Measure  Help needed turning from your back to your side while in a flat bed without using bedrails?: A Lot Help needed moving from lying on your back to sitting on the side of a flat bed without using bedrails?: Total Help needed moving to and from a bed to a chair (including a wheelchair)?: Total Help needed standing up from a chair using your arms (e.g., wheelchair or bedside chair)?: Total Help needed to walk in hospital room?: Total Help needed climbing 3-5 steps with a railing? : Total 6 Click Score: 7    End of Session  Equipment Utilized During Treatment: Oxygen;Left knee immobilizer Activity Tolerance: Patient limited by pain Patient left: in bed;with call bell/phone within reach Nurse Communication: Mobility status PT Visit Diagnosis: Other abnormalities of gait and mobility (R26.89);History of falling (Z91.81);Muscle weakness (generalized) (M62.81);Pain Pain - Right/Left: Left Pain - part of body: Hip;Knee;Leg     Time: 8943-8895 PT Time Calculation (min) (ACUTE ONLY): 8 min  Charges:    $Therapeutic Activity: 8-22 mins PT General Charges $$ ACUTE PT VISIT: 1 Visit                     Sueellen NOVAK, PT, DPT Acute Rehab Services [phone]    Jeovany Huitron 07/02/2024, 1:56 PM

## 2024-07-02 NOTE — Progress Notes (Addendum)
 Nutrition Follow-up  DOCUMENTATION CODES:   Obesity unspecified  INTERVENTION:  Recommend TPN given her extensive surgical hx this hospitalization, risk of malnutrition, and presence of wounds exacerbated by prolonged poor intake  If TPN to be initiated, recommend: Add thiamine to TPN x5 days Daily weights while on TPN MVI and trace minerals added to TPN Monitor magnesium , potassium, and phosphorus daily for at least 3 days, MD to replete as needed, as pt is at risk for refeeding syndrome  Monitor diet advancement and tolerance, recommend DYS3 diet when advanced for ease of consumption  When able to advance diet, recommend: Clear liquids: Add Boost Breeze po TID, each supplement provides 250 kcal and 9 grams of protein (for clear liquid diet) Full liquids: Add Ensure Plus High Protein po BID, each supplement provides 350 kcal and 20 grams of protein (for full liquid diet and above); also Add Magic cup TID with meals, each supplement provides 290 kcal and 9 grams of protein   Continue MVI w/ minerals, either PO or via TPN, if indicated   NUTRITION DIAGNOSIS:  Increased nutrient needs related to acute illness, wound healing as evidenced by estimated needs.  GOAL:  Patient will meet greater than or equal to 90% of their needs  MONITOR:  Diet advancement, Labs, Weight trends, I & O's, Skin  REASON FOR ASSESSMENT:  Rounds    ASSESSMENT:   Pt admitted after a recent fall leading to L distal femur fracture s/p L femoral nailing. PMH significant for perforated sigmoid colon s/p Hartmann procedure, s/p colostomy revision, HTN.  6/20: s/p L femoral nail 6/23: perforated bleeding duodenal ulcer; s/p exlap with graham patch repair, 1 prbc transfused 6/25: 1 prbc transfused 6/26: NGT removed, UGI: no leak; txr out of ICU, TPN initiated, clear liquid diet initiated 6/27: advanced to full liquid diet 6/29: advanced to GI soft diet; 6/30: TPN discontinued 7/02: black/tarry ostomy  output, Hgb 6.1; 2 PRBC transfused; CTA: no active bleeding, deferred embolization; resumed clear liquid diet 7/03: more melena, IR reconsulted and embolization scheduled for today; 1 unit PRBC transfused  7/04: advanced to clear liquid diet 7/05: diet advanced to GI soft diet 7/06 - Hgb 6.8 - 1 unit PRBCs transfused 7/07: Hgb 6.7 - 1 unit PRBCs transfused; CTA repeated: question gastric ulcer 7/08 - Hgb 6.9 - 1 unit PRBCs transfused; GI consulted 7/09 - repeat CT GI bleed: negative; Hgb 7.0 - 1 unit PRBC transfused   Pt continues with melena of unknown source. Transfusions ongoing. Has received >10 units PRBCs thus far this admission. General surgery advising against EGD as patient had recent Midwife. GI has been consulted.   Spoke with surgery PA-C again this morning via secure chat regarding the recommendation for TPN given inability to advance diet. Surgery electing to defer initiation of TPN as her Hgb is stabilizing and may be able to advance diet soon. Discussions ongoing. Per discussion yesterday, likely not candidate for jejunal feedings via Cortrak as it would have to be placed past previous surgical site.    Reported tolerating GI soft diet, however with very poor intake. At time of last interaction (before diet downgraded to clear liquids) patient had consumed Ensure and peaches for breakfast and had fruit the night prior for dinner.    She is unwilling to receive Juven. Have d/c due to patient preference. Reiterating importance of adequate calorie and protein intake to promote wound healing and prevent loss of lean body mass with patient, however not likely to be  able to meet needs while on clear liquid diet.  Of note, need DYS3 diet once advanced to aid in consumption given that she has lost her dentures.    Given her extensive surgical hx this hospitalization, risk of malnutrition, and presence of wounds complicated by prolonged poor intake, would recommend TPN at this  time.   Admit Weight: 122.8 kg Current Weight: 105 kg - ? Accuracy; noted significant drop in weight overnight; likely an error; will assess tomorrow's weight Lowest Weight: 105kg on 7/10 +mild, pitting edema to BLEs   Continues with mild, pitting edema to BLEs. Bowels stable. Ostomy output not excessive, however intake remains suboptimal while on clear liquid diet.     Intake/Output Summary (Last 24 hours) at 07/02/2024 1217 Last data filed at 07/02/2024 0522 Gross per 24 hour  Intake 351.17 ml  Output 2525 ml  Net -2173.83 ml    Net IO Since Admission: -9,428.55 mL [07/02/24 1217]    Nystatin  has been stopped. Remains on high dose PPI. Did require potassium supplementation today. BUN has decreased to within desirable range. Monitoring labs for underlying catabolic etiology given patient's poor intake and significantly increased needs 2/2 inflammation, wounds, and recent surgical procedures. Albumin remains significantly low r/t both poor intake and is a marker of her acute stress response and level of inflammation.    Drains/lines: UOP: x24 hours RUQ colostomy: no output documented  Midline abdominal JP drain: 0 ml x24 hours   Medications: levothyroxine , MVI, pantoprazole    Labs from 7/07 reviewed:  Sodium 133>134>134 (L) K+ 3.4 (L) Hgb 7.0>8.1>8.1 (L) CBG's 79-90 x24 hours  Diet Order:   Diet Order             Diet clear liquid Fluid consistency: Thin  Diet effective now             EDUCATION NEEDS:  Not appropriate for education at this time  Skin:  Skin Assessment: Reviewed RN Assessment Skin Integrity Issues:: Stage III, Stage II, Other (Comment) Stage II: sacrum Stage III: mid coccyx Other: IAD L buttock, L groin, abdomen, bilateral sacrum, vagina  Last BM:  7/9 - type 7 x1 via ostomy  Height:  Ht Readings from Last 1 Encounters:  06/15/24 5' 5 (1.651 m)   Weight:  Wt Readings from Last 1 Encounters:  07/02/24 105 kg   Ideal Body Weight:   56.8 kg  BMI:  Body mass index is 38.52 kg/m.  Estimated Nutritional Needs:   Kcal:  1600-1800  Protein:  85-100g  Fluid:  >/=1.6L  Blair Deaner MS, RD, LDN Registered Dietitian Clinical Nutrition RD Inpatient Contact Info in Amion

## 2024-07-02 NOTE — Plan of Care (Signed)
  Problem: Education: Goal: Knowledge of General Education information will improve Description: Including pain rating scale, medication(s)/side effects and non-pharmacologic comfort measures Outcome: Progressing   Problem: Health Behavior/Discharge Planning: Goal: Ability to manage health-related needs will improve Outcome: Progressing   Problem: Clinical Measurements: Goal: Ability to maintain clinical measurements within normal limits will improve Outcome: Progressing Goal: Will remain free from infection Outcome: Progressing Goal: Diagnostic test results will improve Outcome: Progressing Goal: Respiratory complications will improve Outcome: Progressing Goal: Cardiovascular complication will be avoided Outcome: Progressing   Problem: Activity: Goal: Risk for activity intolerance will decrease Outcome: Progressing   Problem: Nutrition: Goal: Adequate nutrition will be maintained Outcome: Progressing   Problem: Coping: Goal: Level of anxiety will decrease Outcome: Progressing   Problem: Elimination: Goal: Will not experience complications related to bowel motility Outcome: Progressing Goal: Will not experience complications related to urinary retention Outcome: Progressing   Problem: Pain Managment: Goal: General experience of comfort will improve and/or be controlled Outcome: Progressing   Problem: Safety: Goal: Ability to remain free from injury will improve Outcome: Progressing   Problem: Skin Integrity: Goal: Risk for impaired skin integrity will decrease Outcome: Progressing   Problem: Safety: Goal: Non-violent Restraint(s) Outcome: Progressing   Problem: Activity: Goal: Ability to tolerate increased activity will improve Outcome: Progressing   Problem: Respiratory: Goal: Ability to maintain a clear airway and adequate ventilation will improve Outcome: Progressing   Problem: Role Relationship: Goal: Method of communication will improve Outcome:  Progressing   Problem: Education: Goal: Understanding of CV disease, CV risk reduction, and recovery process will improve Outcome: Progressing Goal: Individualized Educational Video(s) Outcome: Progressing   Problem: Activity: Goal: Ability to return to baseline activity level will improve Outcome: Progressing   Problem: Cardiovascular: Goal: Ability to achieve and maintain adequate cardiovascular perfusion will improve Outcome: Progressing Goal: Vascular access site(s) Level 0-1 will be maintained Outcome: Progressing   Problem: Health Behavior/Discharge Planning: Goal: Ability to safely manage health-related needs after discharge will improve Outcome: Progressing

## 2024-07-02 NOTE — Progress Notes (Signed)
 Progress Note  17 Days Post-Op  Subjective: Pt denies worsening abdominal pain. Ongoing melena per ostomy.   Objective: Vital signs in last 24 hours: Temp:  [98.4 F (36.9 C)-98.9 F (37.2 C)] 98.8 F (37.1 C) (07/10 0800) Pulse Rate:  [56-78] 58 (07/10 0800) Resp:  [13-19] 16 (07/10 0800) BP: (92-114)/(43-103) 106/57 (07/10 0800) SpO2:  [94 %-100 %] 100 % (07/10 0913) Weight:  [105 kg] 105 kg (07/10 0521) Last BM Date : 07/01/24  Intake/Output from previous day: 07/09 0701 - 07/10 0700 In: 351.2 [I.V.:25.2; Blood:326] Out: 2525 [Urine:2525] Intake/Output this shift: No intake/output data recorded.  PE: General: WD, morbidly obese female who is laying in bed in NAD Heart: regular, rate, and rhythm. Lungs: Respiratory effort nonlabored Abd: soft, appropriately ttp, ostomy viable with some melanotic stool present, drain serous, incision C/D/I with staples present    Lab Results:  Recent Labs    07/02/24 0137 07/02/24 0451  WBC 5.9 5.8  HGB 8.1* 8.1*  HCT 24.7* 25.2*  PLT 199 212   BMET Recent Labs    07/01/24 0329 07/02/24 0451  NA 134* 134*  K 3.6 3.4*  CL 100 100  CO2 27 28  GLUCOSE 90 79  BUN 31* 19  CREATININE 0.99 0.98  CALCIUM  7.6* 7.6*   PT/INR Recent Labs    06/29/24 1538  LABPROT 17.8*  INR 1.4*   CMP     Component Value Date/Time   NA 134 (L) 07/02/2024 0451   K 3.4 (L) 07/02/2024 0451   CL 100 07/02/2024 0451   CO2 28 07/02/2024 0451   GLUCOSE 79 07/02/2024 0451   BUN 19 07/02/2024 0451   CREATININE 0.98 07/02/2024 0451   CALCIUM  7.6 (L) 07/02/2024 0451   PROT 3.9 (L) 07/01/2024 0329   ALBUMIN <1.5 (L) 07/01/2024 0329   AST 15 07/01/2024 0329   ALT 11 07/01/2024 0329   ALKPHOS 47 07/01/2024 0329   BILITOT 0.4 07/01/2024 0329   GFRNONAA >60 07/02/2024 0451   GFRAA >60 06/05/2016 1320   Lipase     Component Value Date/Time   LIPASE 64 (H) 03/28/2016 1705       Studies/Results: CT ANGIO GI BLEED Result Date:  07/01/2024 CLINICAL DATA:  Evaluate for GI bleed. EXAM: CTA ABDOMEN AND PELVIS WITHOUT AND WITH CONTRAST TECHNIQUE: Multidetector CT imaging of the abdomen and pelvis was performed using the standard protocol during bolus administration of intravenous contrast. Multiplanar reconstructed images and MIPs were obtained and reviewed to evaluate the vascular anatomy. RADIATION DOSE REDUCTION: This exam was performed according to the departmental dose-optimization program which includes automated exposure control, adjustment of the mA and/or kV according to patient size and/or use of iterative reconstruction technique. CONTRAST:  OMNIPAQUE  IOHEXOL  350 MG/ML SOLN COMPARISON:  CT dated 06/29/2024. FINDINGS: VASCULAR Aorta: Moderate atherosclerotic calcification. No aneurysmal dilatation or dissection. No periaortic fluid collection. Celiac: The celiac artery and its major branches are patent. Coil embolization of the GDA. SMA: Atherosclerotic calcification of the origin of the SMA. The SMA remains patent. Renals: Atherosclerotic calcification of the renal arteries. The renal arteries are patent. Duplicated right renal artery anatomy. IMA: The IMA is patent. Inflow: Mild atherosclerotic calcification. No aneurysmal dilatation or dissection. The iliac arteries are patent. Proximal Outflow: The visualized proximal outflows patent. Veins: The IVC is unremarkable. The SMV, splenic vein, and main portal vein are patent no portal venous gas. Review of the MIP images confirms the above findings. NON-VASCULAR Lower chest: Partially visualized bilateral pleural  effusions with associated compressive atelectasis of the adjacent lungs. Pneumonia is not excluded. No intra-abdominal free air.  Trace perihepatic ascites. Hepatobiliary: Slight irregularity of the liver contour may represent changes of cirrhosis. Clinical correlation recommended. Cholecystectomy. No retained calcified stone noted in the central CBD. JP drainage catheter  in similar position along the inferior surface of the liver. Trace fluid noted adjacent to the catheter. Pancreas: Unremarkable. No pancreatic ductal dilatation or surrounding inflammatory changes. Spleen: Normal in size without focal abnormality. Adrenals/Urinary Tract: The adrenal glands unremarkable. An 8 mm nonobstructing left renal upper pole calculus. Duplicated right renal collecting system. Similar appearance of a hypoenhancing area in the inferior pole of the right kidney as the prior CT. This may represent an area of infarct or lobar nephronia/developing abscess. Underlying mass is not excluded. Attention on follow-up imaging recommended. Mild right hydronephrosis. The urinary bladder is decompressed around a Foley catheter. Stomach/Bowel: There is diffuse thickening of the distal stomach, slightly progressed since the prior CT and may represent gastritis. Underlying small gastric ulcer is not excluded. There is contrast in the proximal duodenum which appears relatively unchanged on the precontrast and postcontrast images and may represent retained oral contrast seen within the stomach on the prior CT. No evidence of active GI bleed on today's exam. Postsurgical changes of the bowel with right anterior colostomy. There is sigmoid diverticulosis. Oral contrast noted in the proximal colon extends into the colostomy. No bowel obstruction. Appendectomy. Lymphatic: No adenopathy. Reproductive: Hysterectomy. Other: Large ventral hernia containing portion of the stomach, large and small bowel. Musculoskeletal: Osteopenia with degenerative changes. Left femoral nail. No acute osseous pathology. Sacral decubitus ulcer. No fluid collection. IMPRESSION: 1. No evidence of active GI bleed on today's exam. 2. Diffuse thickening of the distal stomach, slightly progressed since the prior CT and may represent gastritis. Underlying small gastric ulcer is not excluded. 3. Partially visualized bilateral pleural effusions with  associated compressive atelectasis of the adjacent lungs. Pneumonia is not excluded. 4. An 8 mm nonobstructing left renal upper pole calculus. 5.  Aortic Atherosclerosis (ICD10-I70.0). Electronically Signed   By: Vanetta Chou M.D.   On: 07/01/2024 15:52    Anti-infectives: Anti-infectives (From admission, onward)    Start     Dose/Rate Route Frequency Ordered Stop   06/16/24 1800  vancomycin  (VANCOCIN ) IVPB 1000 mg/200 mL premix  Status:  Discontinued        1,000 mg 200 mL/hr over 60 Minutes Intravenous Every 24 hours 06/15/24 1317 06/15/24 1328   06/15/24 1700  vancomycin  (VANCOREADY) IVPB 2000 mg/400 mL  Status:  Discontinued        2,000 mg 200 mL/hr over 120 Minutes Intravenous  Once 06/15/24 1317 06/15/24 1328   06/15/24 1600  fluconazole  (DIFLUCAN ) IVPB 400 mg  Status:  Discontinued        400 mg 100 mL/hr over 120 Minutes Intravenous Every 24 hours 06/15/24 1317 06/16/24 0914   06/15/24 1500  metroNIDAZOLE  (FLAGYL ) IVPB 500 mg  Status:  Discontinued        500 mg 100 mL/hr over 60 Minutes Intravenous Every 12 hours 06/15/24 1216 06/15/24 1255   06/15/24 1400  ciprofloxacin  (CIPRO ) IVPB 400 mg  Status:  Discontinued        400 mg 200 mL/hr over 60 Minutes Intravenous Every 12 hours 06/15/24 1216 06/15/24 1255   06/15/24 1400  piperacillin -tazobactam (ZOSYN ) IVPB 3.375 g        3.375 g 12.5 mL/hr over 240 Minutes Intravenous Every 8 hours 06/15/24  1317 06/20/24 2359   06/15/24 1400  vancomycin  (VANCOCIN ) IVPB 1000 mg/200 mL premix  Status:  Discontinued        1,000 mg 200 mL/hr over 60 Minutes Intravenous Every 24 hours 06/15/24 1328 06/16/24 0914   06/15/24 1333  vancomycin  (VANCOCIN ) 1-5 GM/200ML-% IVPB       Note to Pharmacy: Romona Shaver E: cabinet override      06/15/24 1333 06/15/24 1410   06/15/24 1221  ciprofloxacin  (CIPRO ) 400 MG/200ML IVPB  Status:  Discontinued       Note to Pharmacy: Dannielle Railing S: cabinet override      06/15/24 1221 06/15/24 1343    06/15/24 1221  metroNIDAZOLE  (FLAGYL ) 500 MG/100ML IVPB  Status:  Discontinued       Note to Pharmacy: Dannielle Railing S: cabinet override      06/15/24 1221 06/15/24 1343   06/12/24 1223  clindamycin  (CLEOCIN ) 900 MG/50ML IVPB       Note to Pharmacy: Elaine Nest L: cabinet override      06/12/24 1223 06/12/24 1332   06/12/24 1131  ceFAZolin  (ANCEF ) 2-4 GM/100ML-% IVPB  Status:  Discontinued       Note to Pharmacy: Billy Sluder H: cabinet override      06/12/24 1131 06/12/24 1434   06/11/24 1300  clindamycin  (CLEOCIN ) IVPB 900 mg  Status:  Discontinued        900 mg 100 mL/hr over 30 Minutes Intravenous On call to O.R. 06/10/24 1041 06/10/24 1447   06/11/24 1300  ceFAZolin  (ANCEF ) IVPB 2g/100 mL premix        2 g 200 mL/hr over 30 Minutes Intravenous On call to O.R. 06/10/24 1447 06/12/24 1908        Assessment/Plan POD 17 , s/p ex lap with graham patch repair and JP drain placement for Perforated duodenal ulcer, possible bleeding ulcer as well, Dr. Johanna at [facility] 6/23, now with melena - IR embolized (7/4) old blood in colostomy noted   - Hgb stable at 8.1 this AM, s/p 1 PRBC yesterday - h. Pylori negative - will actually leave her surgical drain for right now to assure no leak from necrosis after AE - CLD - will discuss with MD but I think could advance to FLD - CT GI bleed protocol 7/7- negative for active bleeding but concern for thickening of distal stomach - repeat CT GI bleed 7/9 negative for active bleed - she is too soon post-op to consider EGD at this time due to risk of re-perforation  - in discussions with IR repeat angio not recommended at this time - multi-modal pain control - PT, OOB as able pending femur fx recs - Coninue BID PPI and carafate   ABL anemia, due to above - continue to monitor hgb and transfuse PRN     FEN - CLD VTE - on hold due to anemia ID - zosyn , completed    LOS: 25 days   I reviewed Consultant GI and IR notes, hospitalist  notes, last 24 h vitals and pain scores, last 48 h intake and output, last 24 h labs and trends, and last 24 h imaging results.  This care required moderate level of medical decision making.    Burnard JONELLE Louder, [facility] Surgery 07/02/2024, 10:02 AM Please see Amion for pager number during day hours 7:00am-4:30pm

## 2024-07-03 DIAGNOSIS — K276 Chronic or unspecified peptic ulcer, site unspecified, with both hemorrhage and perforation: Secondary | ICD-10-CM | POA: Diagnosis not present

## 2024-07-03 DIAGNOSIS — N179 Acute kidney failure, unspecified: Secondary | ICD-10-CM | POA: Diagnosis not present

## 2024-07-03 DIAGNOSIS — J449 Chronic obstructive pulmonary disease, unspecified: Secondary | ICD-10-CM | POA: Diagnosis not present

## 2024-07-03 DIAGNOSIS — G4733 Obstructive sleep apnea (adult) (pediatric): Secondary | ICD-10-CM | POA: Diagnosis not present

## 2024-07-03 LAB — HEMOGLOBIN AND HEMATOCRIT, BLOOD
HCT: 23.9 % — ABNORMAL LOW (ref 36.0–46.0)
Hemoglobin: 7.6 g/dL — ABNORMAL LOW (ref 12.0–15.0)

## 2024-07-03 LAB — BASIC METABOLIC PANEL WITH GFR
Anion gap: 6 (ref 5–15)
BUN: 14 mg/dL (ref 8–23)
CO2: 29 mmol/L (ref 22–32)
Calcium: 7.7 mg/dL — ABNORMAL LOW (ref 8.9–10.3)
Chloride: 98 mmol/L (ref 98–111)
Creatinine, Ser: 0.83 mg/dL (ref 0.44–1.00)
GFR, Estimated: >60 mL/min (ref >60)
Glucose, Bld: 164 mg/dL — ABNORMAL HIGH (ref 70–99)
Potassium: 3.6 mmol/L (ref 3.5–5.1)
Sodium: 133 mmol/L — ABNORMAL LOW (ref 135–145)

## 2024-07-03 LAB — CBC
HCT: 24.8 % — ABNORMAL LOW (ref 36.0–46.0)
Hemoglobin: 7.8 g/dL — ABNORMAL LOW (ref 12.0–15.0)
MCH: 30.1 pg (ref 26.0–34.0)
MCHC: 31.5 g/dL (ref 30.0–36.0)
MCV: 95.8 fL (ref 80.0–100.0)
Platelets: 203 K/uL (ref 150–400)
RBC: 2.59 MIL/uL — ABNORMAL LOW (ref 3.87–5.11)
RDW: 16.7 % — ABNORMAL HIGH (ref 11.5–15.5)
WBC: 7.3 K/uL (ref 4.0–10.5)
nRBC: 0 % (ref 0.0–0.2)

## 2024-07-03 MED ORDER — LACTATED RINGERS IV BOLUS
500.0000 mL | Freq: Once | INTRAVENOUS | Status: AC
  Administered 2024-07-03: 500 mL via INTRAVENOUS

## 2024-07-03 NOTE — Progress Notes (Signed)
 Progress Note  18 Days Post-Op  Subjective: Pt denies worsening abdominal pain. Stool today brown and no melena  Objective: Vital signs in last 24 hours: Temp:  [97.7 F (36.5 C)-99.2 F (37.3 C)] 97.7 F (36.5 C) (07/11 0830) Pulse Rate:  [34-64] 58 (07/11 0830) Resp:  [14-26] 18 (07/11 0830) BP: (106-143)/(35-68) 115/59 (07/11 0800) SpO2:  [90 %-100 %] 100 % (07/11 0830) Last BM Date : 07/03/24  Intake/Output from previous day: 07/10 0701 - 07/11 0700 In: 120 [P.O.:20; I.V.:100] Out: 1850 [Urine:1050; Stool:800] Intake/Output this shift: Total I/O In: 1026.6 [I.V.:1026.6] Out: 0   PE: General: WD, morbidly obese female who is laying in bed in NAD Heart: regular, rate, and rhythm. Lungs: Respiratory effort nonlabored Abd: soft, appropriately ttp, ostomy viable with some soft brown stool present, drain serous, incision C/D/I with staples present    Lab Results:  Recent Labs    07/02/24 1810 07/03/24 0400  WBC 9.1 7.3  HGB 8.3* 7.8*  HCT 25.6* 24.8*  PLT 210 203   BMET Recent Labs    07/02/24 0451 07/03/24 0400  NA 134* 133*  K 3.4* 3.6  CL 100 98  CO2 28 29  GLUCOSE 79 164*  BUN 19 14  CREATININE 0.98 0.83  CALCIUM  7.6* 7.7*   PT/INR No results for input(s): LABPROT, INR in the last 72 hours.  CMP     Component Value Date/Time   NA 133 (L) 07/03/2024 0400   K 3.6 07/03/2024 0400   CL 98 07/03/2024 0400   CO2 29 07/03/2024 0400   GLUCOSE 164 (H) 07/03/2024 0400   BUN 14 07/03/2024 0400   CREATININE 0.83 07/03/2024 0400   CALCIUM  7.7 (L) 07/03/2024 0400   PROT 3.9 (L) 07/01/2024 0329   ALBUMIN  <1.5 (L) 07/01/2024 0329   AST 15 07/01/2024 0329   ALT 11 07/01/2024 0329   ALKPHOS 47 07/01/2024 0329   BILITOT 0.4 07/01/2024 0329   GFRNONAA >60 07/03/2024 0400   GFRAA >60 06/05/2016 1320   Lipase     Component Value Date/Time   LIPASE 64 (H) 03/28/2016 1705       Studies/Results: CT ANGIO GI BLEED Result Date:  07/01/2024 CLINICAL DATA:  Evaluate for GI bleed. EXAM: CTA ABDOMEN AND PELVIS WITHOUT AND WITH CONTRAST TECHNIQUE: Multidetector CT imaging of the abdomen and pelvis was performed using the standard protocol during bolus administration of intravenous contrast. Multiplanar reconstructed images and MIPs were obtained and reviewed to evaluate the vascular anatomy. RADIATION DOSE REDUCTION: This exam was performed according to the departmental dose-optimization program which includes automated exposure control, adjustment of the mA and/or kV according to patient size and/or use of iterative reconstruction technique. CONTRAST:  OMNIPAQUE  IOHEXOL  350 MG/ML SOLN COMPARISON:  CT dated 06/29/2024. FINDINGS: VASCULAR Aorta: Moderate atherosclerotic calcification. No aneurysmal dilatation or dissection. No periaortic fluid collection. Celiac: The celiac artery and its major branches are patent. Coil embolization of the GDA. SMA: Atherosclerotic calcification of the origin of the SMA. The SMA remains patent. Renals: Atherosclerotic calcification of the renal arteries. The renal arteries are patent. Duplicated right renal artery anatomy. IMA: The IMA is patent. Inflow: Mild atherosclerotic calcification. No aneurysmal dilatation or dissection. The iliac arteries are patent. Proximal Outflow: The visualized proximal outflows patent. Veins: The IVC is unremarkable. The SMV, splenic vein, and main portal vein are patent no portal venous gas. Review of the MIP images confirms the above findings. NON-VASCULAR Lower chest: Partially visualized bilateral pleural effusions with associated compressive  atelectasis of the adjacent lungs. Pneumonia is not excluded. No intra-abdominal free air.  Trace perihepatic ascites. Hepatobiliary: Slight irregularity of the liver contour may represent changes of cirrhosis. Clinical correlation recommended. Cholecystectomy. No retained calcified stone noted in the central CBD. JP drainage catheter  in similar position along the inferior surface of the liver. Trace fluid noted adjacent to the catheter. Pancreas: Unremarkable. No pancreatic ductal dilatation or surrounding inflammatory changes. Spleen: Normal in size without focal abnormality. Adrenals/Urinary Tract: The adrenal glands unremarkable. An 8 mm nonobstructing left renal upper pole calculus. Duplicated right renal collecting system. Similar appearance of a hypoenhancing area in the inferior pole of the right kidney as the prior CT. This may represent an area of infarct or lobar nephronia/developing abscess. Underlying mass is not excluded. Attention on follow-up imaging recommended. Mild right hydronephrosis. The urinary bladder is decompressed around a Foley catheter. Stomach/Bowel: There is diffuse thickening of the distal stomach, slightly progressed since the prior CT and may represent gastritis. Underlying small gastric ulcer is not excluded. There is contrast in the proximal duodenum which appears relatively unchanged on the precontrast and postcontrast images and may represent retained oral contrast seen within the stomach on the prior CT. No evidence of active GI bleed on today's exam. Postsurgical changes of the bowel with right anterior colostomy. There is sigmoid diverticulosis. Oral contrast noted in the proximal colon extends into the colostomy. No bowel obstruction. Appendectomy. Lymphatic: No adenopathy. Reproductive: Hysterectomy. Other: Large ventral hernia containing portion of the stomach, large and small bowel. Musculoskeletal: Osteopenia with degenerative changes. Left femoral nail. No acute osseous pathology. Sacral decubitus ulcer. No fluid collection. IMPRESSION: 1. No evidence of active GI bleed on today's exam. 2. Diffuse thickening of the distal stomach, slightly progressed since the prior CT and may represent gastritis. Underlying small gastric ulcer is not excluded. 3. Partially visualized bilateral pleural effusions with  associated compressive atelectasis of the adjacent lungs. Pneumonia is not excluded. 4. An 8 mm nonobstructing left renal upper pole calculus. 5.  Aortic Atherosclerosis (ICD10-I70.0). Electronically Signed   By: Vanetta Chou M.D.   On: 07/01/2024 15:52    Anti-infectives: Anti-infectives (From admission, onward)    Start     Dose/Rate Route Frequency Ordered Stop   06/16/24 1800  vancomycin  (VANCOCIN ) IVPB 1000 mg/200 mL premix  Status:  Discontinued        1,000 mg 200 mL/hr over 60 Minutes Intravenous Every 24 hours 06/15/24 1317 06/15/24 1328   06/15/24 1700  vancomycin  (VANCOREADY) IVPB 2000 mg/400 mL  Status:  Discontinued        2,000 mg 200 mL/hr over 120 Minutes Intravenous  Once 06/15/24 1317 06/15/24 1328   06/15/24 1600  fluconazole  (DIFLUCAN ) IVPB 400 mg  Status:  Discontinued        400 mg 100 mL/hr over 120 Minutes Intravenous Every 24 hours 06/15/24 1317 06/16/24 0914   06/15/24 1500  metroNIDAZOLE  (FLAGYL ) IVPB 500 mg  Status:  Discontinued        500 mg 100 mL/hr over 60 Minutes Intravenous Every 12 hours 06/15/24 1216 06/15/24 1255   06/15/24 1400  ciprofloxacin  (CIPRO ) IVPB 400 mg  Status:  Discontinued        400 mg 200 mL/hr over 60 Minutes Intravenous Every 12 hours 06/15/24 1216 06/15/24 1255   06/15/24 1400  piperacillin -tazobactam (ZOSYN ) IVPB 3.375 g        3.375 g 12.5 mL/hr over 240 Minutes Intravenous Every 8 hours 06/15/24 1317 06/20/24 2359  06/15/24 1400  vancomycin  (VANCOCIN ) IVPB 1000 mg/200 mL premix  Status:  Discontinued        1,000 mg 200 mL/hr over 60 Minutes Intravenous Every 24 hours 06/15/24 1328 06/16/24 0914   06/15/24 1333  vancomycin  (VANCOCIN ) 1-5 GM/200ML-% IVPB       Note to Pharmacy: Romona Shaver E: cabinet override      06/15/24 1333 06/15/24 1410   06/15/24 1221  ciprofloxacin  (CIPRO ) 400 MG/200ML IVPB  Status:  Discontinued       Note to Pharmacy: Dannielle Railing S: cabinet override      06/15/24 1221 06/15/24 1343    06/15/24 1221  metroNIDAZOLE  (FLAGYL ) 500 MG/100ML IVPB  Status:  Discontinued       Note to Pharmacy: Dannielle Railing S: cabinet override      06/15/24 1221 06/15/24 1343   06/12/24 1223  clindamycin  (CLEOCIN ) 900 MG/50ML IVPB       Note to Pharmacy: Elaine Nest L: cabinet override      06/12/24 1223 06/12/24 1332   06/12/24 1131  ceFAZolin  (ANCEF ) 2-4 GM/100ML-% IVPB  Status:  Discontinued       Note to Pharmacy: Billy Sluder H: cabinet override      06/12/24 1131 06/12/24 1434   06/11/24 1300  clindamycin  (CLEOCIN ) IVPB 900 mg  Status:  Discontinued        900 mg 100 mL/hr over 30 Minutes Intravenous On call to O.R. 06/10/24 1041 06/10/24 1447   06/11/24 1300  ceFAZolin  (ANCEF ) IVPB 2g/100 mL premix        2 g 200 mL/hr over 30 Minutes Intravenous On call to O.R. 06/10/24 1447 06/12/24 1908        Assessment/Plan POD 18 , s/p ex lap with graham patch repair and JP drain placement for Perforated duodenal ulcer, possible bleeding ulcer as well, Dr. Johanna at [facility] 6/23, now with melena - IR embolized (7/4) old blood in colostomy noted   - Hgb stable at 7.8 this AM from 8.3, continue to monitor closely  - h. Pylori negative - will actually leave her surgical drain for right now to assure no leak from necrosis after AE - FLD today given resolution in melena - CT GI bleed protocol 7/7- negative for active bleeding but concern for thickening of distal stomach - repeat CT GI bleed 7/9 negative for active bleed - she is too soon post-op to consider EGD at this time due to risk of re-perforation  - in discussions with IR repeat angio not recommended at this time - fixed rate vaso started 7/10 - multi-modal pain control - PT, OOB as able pending femur fx recs - Coninue BID PPI and carafate  - ok to remove staples today   ABL anemia, due to above - continue to monitor hgb and transfuse PRN     FEN - FLD VTE - on hold due to anemia ID - zosyn , completed    LOS: 26 days    I reviewed hospitalist notes, last 24 h vitals and pain scores, last 48 h intake and output, last 24 h labs and trends, and last 24 h imaging results.  This care required moderate level of medical decision making.    Burnard JONELLE Louder, [facility] 07/03/2024, 11:49 AM Please see Amion for pager number during day hours 7:00am-4:30pm

## 2024-07-03 NOTE — Progress Notes (Signed)
 Occupational Therapy Treatment Patient Details Name: Christina Castaneda MRN: [id number] DOB: 1956/10/15 Today's Date: 07/03/2024   History of present illness Pt is a 68 y/o F presenting to [facility] ED on 6/15 after fall, found to have L femoral neck fx and AKI. S/p retrograde IMN on 6/20. Hgb drop on 6/23 with dark ostomy output, transfused, subsequent hypotension requiring pressors. Found to have perforated bleeding duodenal ulcer s/p ex lap on 6/23. Transferred to [facility] for PCCM. 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   OT comments  Increased theraband resistance for UB exercises to level 2. Instructed in bed level LE exercises with gait belt, completed dorsiflexion and straight leg raise on L. Total assist to attempt hair combing due to large knot, will likely need a brush and detangler. Pt is eager to eat soft foods. Downgraded OT goals. Patient will benefit from continued inpatient follow up therapy, <3 hours/day.      If plan is discharge home, recommend the following:  Two people to help with walking and/or transfers;Two people to help with bathing/dressing/bathroom;Assistance with cooking/housework;Assist for transportation;Help with stairs or ramp for entrance   Equipment Recommendations  Hoyer lift;Hospital bed;Wheelchair cushion (measurements OT);Wheelchair (measurements OT)    Recommendations for Other Services      Precautions / Restrictions Precautions Precautions: Fall;Other (comment) Recall of Precautions/Restrictions: Intact Precaution/Restrictions Comments: JP drain, colostomy, sacral wound Required Braces or Orthoses: Knee Immobilizer - Left Restrictions Weight Bearing Restrictions Per Provider Order: Yes LLE Weight Bearing Per Provider Order: Weight bearing as tolerated       Mobility Bed Mobility                    Transfers                         Balance                                            ADL either performed or assessed with clinical judgement   ADL Overall ADL's : Needs assistance/impaired     Grooming: Brushing hair;Bed level Grooming Details (indicate cue type and reason): needing assist due to excessive knotting in back of head                                    Extremity/Trunk Assessment              Vision       Perception     Praxis     Communication Communication Communication: No apparent difficulties   Cognition Arousal: Alert Behavior During Therapy: WFL for tasks assessed/performed Cognition: No apparent impairments             OT - Cognition Comments: pt immediately engaging with OT, amenable to exercises                 Following commands: Intact        Cueing   Cueing Techniques: Verbal cues  Exercises Exercises: General Upper Extremity General Exercises - Upper Extremity Shoulder Flexion: Strengthening, Both, 10 reps, Supine, Theraband Theraband Level (Shoulder Flexion): Level 2 (Red) Shoulder Horizontal ABduction: Strengthening, 10 reps, Supine, Theraband Theraband Level (Shoulder Horizontal Abduction): Level 2 (Red) Elbow Flexion: Strengthening, Both, 10 reps,  Theraband, Supine Theraband Level (Elbow Flexion): Level 2 (Red) Elbow Extension: Strengthening, Both, 10 reps, Supine, Theraband Theraband Level (Elbow Extension): Level 2 (Red) Other Exercises Other Exercises: R ankle dorsiflexion and straight leg raises with gait belt    Shoulder Instructions       General Comments      Pertinent Vitals/ Pain       Pain Assessment Pain Assessment: Faces Faces Pain Scale: No hurt  Home Living                                          Prior Functioning/Environment              Frequency  Min 1X/week        Progress Toward Goals  OT Goals(current goals can now be found in the care plan section)  Progress towards OT goals: Progressing toward  goals  Acute Rehab OT Goals OT Goal Formulation: With patient Time For Goal Achievement: 07/17/24 Potential to Achieve Goals: Fair ADL Goals Pt Will Perform Grooming: with set-up;sitting Pt Will Perform Upper Body Dressing: with min assist;sitting Pt/caregiver will Perform Home Exercise Program: Increased strength;Increased ROM;Independently;Both right and left upper extremity Additional ADL Goal #1: Pt will tolerate 1 hour OOB in chair. Additional ADL Goal #2: Pt will roll with moderate assistance for pericare and repositioning. Additional ADL Goal #3: Pt will stand with +2 moderate assistance as a precursor to toilet transfers.  Plan      Co-evaluation                 AM-PAC OT 6 Clicks Daily Activity     Outcome Measure   Help from another person eating meals?: A Little Help from another person taking care of personal grooming?: Total Help from another person toileting, which includes using toliet, bedpan, or urinal?: Total Help from another person bathing (including washing, rinsing, drying)?: A Lot Help from another person to put on and taking off regular upper body clothing?: A Lot Help from another person to put on and taking off regular lower body clothing?: Total 6 Click Score: 10    End of Session Equipment Utilized During Treatment: Oxygen  OT Visit Diagnosis: Muscle weakness (generalized) (M62.81)   Activity Tolerance Patient tolerated treatment well   Patient Left in bed;with call bell/phone within reach   Nurse Communication          Time: 8466-8443 OT Time Calculation (min): 23 min  Charges: OT General Charges $OT Visit: 1 Visit OT Treatments $Self Care/Home Management : 8-22 mins $Therapeutic Exercise: 8-22 mins  Mliss HERO, OTR/L Acute Rehabilitation Services Office: [phone]   Kennth Mliss Helling 07/03/2024, 4:25 PM

## 2024-07-03 NOTE — Progress Notes (Addendum)
 [facility] night cross cover note:   I was notified by the patient's RN that the patient's blood pressure is running slightly lower, with most recent systolic blood pressures in the high 80s to high 90s mmHg.  Other vital signs appear stable, including afebrile, heart rates in the 60s, oxygen saturation 96 to 99% on 4L , upon which the pt has been for over 24 hours.  RN conveys that the patient is currently asymptomatic.  The patient had been on vasopressin  drip over the last 24 hours, before discontinuation at 1620 today.  Leading up to discontinuation of the vasopressin  drip, blood pressure was noted to be 115/64. At 1800, blood pressure had decreased to 82/60, prompting a 500 cc LR bolus, following which systolic blood pressure improved into the low 100s mmHg before subsequently trending down slightly to the current SBP's in the high 80s - 90's mmHg.   Potential contribution from rebound hypotension in setting of recent discontinuation of vasopressin  drip. Additionally, in setting of recent history of acute upper GI bleed complicated by acute blood loss anemia, will also check STAT H&H.  It is noted that the patient has an active type and screen in place.  Most recent PRBC transfusion occurred on 07/01/2024.   Most recent CBC performed this morning showed hemoglobin 7.8, down from 8.3 at 1800 yesterday.  Platelet count this morning was noted to be 210.  BUN trend per this morning's BMP appeared reassuring, with BUN of 14, compared to 19 on the AM of 7/10.   While awaiting result of STAT H&H, I've also ordered another 500 cc LR bolus.    Update: STAT H&H reflects Hgb of 7.6 (compared to 7.8 this AM).    Update: following completion of 500 cc LR bolus, and after exchanging BP cuff for a smaller one, wrist BP shows internal improvement in BP, now 110/50 with MAP 68 mmHg.   I subsequently ordered LR 100 cc/hr x 8 hours.      Eva Pore, DO Hospitalist

## 2024-07-03 NOTE — Plan of Care (Signed)
   Problem: Education: Goal: Knowledge of General Education information will improve Description: Including pain rating scale, medication(s)/side effects and non-pharmacologic comfort measures Outcome: Progressing   Problem: Coping: Goal: Level of anxiety will decrease Outcome: Progressing

## 2024-07-03 NOTE — Plan of Care (Signed)

## 2024-07-03 NOTE — Progress Notes (Signed)
 PROGRESS NOTE        PATIENT DETAILS Name: Christina Castaneda Age: 68 y.o. Sex: female Date of Birth: 1956-08-27 Admit Date: 06/07/2024 Admitting Physician Orlin Fairly, MD ERE:Floopd, Mariano SQUIBB, DO  Brief Summary: Patient is a 68 y.o.  female with history of perforated sigmoid colon-s/p Hartman's/colostomy 2017-who initially presented to [facility] following a mechanical fall-she was found to have left distal femur fracture-underwent retrograde femoral nail on 6/20, unfortunately-further hospital course complicated by perforated duodenal ulcer with acute blood loss anemia resulting in hypovolemic shock-she was evaluated by general surgery and underwent emergent exploratory laparotomy with Graham's patch-postoperatively-she was transferred to [facility] ICU.  She was stabilized and then subsequently transferred to [facility] service.  Unfortunately-further hospital course complicated by recurrent melena through ostomy and acute blood loss anemia-after discussion with CCS and then with interventional radiology-patient underwent on GDA embolization on 7/4.  See below for further details.   Significant events: 6/15>> mechanical fall-left femur fracture/AKI-admit to [facility] at [facility]. 6/20>> retrograde femoral nail by orthopedics at [facility] 6/23>> perforated duodenal ulcer-hypotension-exploratory laparotomy/placement of Arlyss supplies.  Remained intubated postprocedure-transferred to [facility] ICU. 6/24>> extubated 6/25>> off pressors-out of ICU-drop in hemoglobin but nonbloody ostomy output. 6/26>> transferred to [facility] service 7/02>> Black tarry stools in ostomy-Hb down to 6.1-General Surgery reconsulted-too early for EGD-CT angio negative for bleed-IR deferred embolization 7/03 >> continues to have black tarry stools-IR performed GDA embolization. 7/06>>  Hb 6.8-continues to have black stools in ostomy-1 unit of blood ordered. 7/07>> Hb 6.7-CT angiogram negative-1 unit of blood ordered. 7/08>> Hb 6.9-1 unit of  blood ordered.  GI consulted.  CCS discussed with IR-no further embolization options-GDA completely occluded on prior study.  Extensive discussion with CCS/GI-CCS felt, risk of endoscopy was significant-patient felt to be at significant risk of perforation-with no real good operative fix for recurrent perf.  CCS ordered cryoprecipitate.  CCS opined that EGD be reserved only for hemodynamically unstable bleed or consider EGD once patient was at least 1 month out from laparotomy. 7/09>> IR ordered repeat CTA-no targets for embolization as no obvious extravasation.  1 additional PRBC transfusion for Hb of 7.0.  Protonix  dosage increased to 80 mg IV twice daily x 3 days before switching back to 40 mg twice daily. 7/10>> fixed dose vasopressin  infusion started. 7/11>> some brown stools in ostomy  Significant studies: 6/15>> CT left knee: Acute comminuted fracture of distal left femoral shaft. 6/16>> renal ultrasound: No hydronephrosis 6/23>> CT angiogram abdomen: Perforated and bleeding peptic ulcer disease affecting proximal duodenum-appears to be hematoma in the gastric antrum/proximal duodenum. 6/26>> upper GI series with KUB: No evidence of contrast leak. 7/02>> CT angio GI bleed study: No active bleeding. 7/07>> CT angio GI bleed study: No active bleeding 7/09>> CT angio GI bleed; no active bleeding  Significant microbiology data: 6/23>> blood culture: No growth 6/23>> peritoneal fluid: Abundant Candida albicans/moderate Candida glabrata.  Procedures: 6/20>> retrograde femoral nail by orthopedics at [facility] 6/23>> exploratory laparotomy with omental Graham's patch repair 7/03>> GDA embolization.  Consults: GI at [facility] at [facility] and [facility] IR  Subjective: No complaints-brown stools in ostomy today.  Objective: Vitals: Blood pressure (!) 115/59, pulse (!) 58, temperature 97.7 F (36.5 C), resp. rate 18, height 5' 5 (1.651 m), weight 105 kg, last menstrual period 11/18/2012,  SpO2 100%.   Exam: Awake/alert Not in any distress Abdomen:  Soft nontender nondistended-brown stools in ostomy today. Extremities: Trace edema Generalized weakness nonfocal exam.  Pertinent Labs/Radiology:    Latest Ref Rng & Units 07/03/2024    4:00 AM 07/02/2024    6:10 PM 07/02/2024    4:51 AM  CBC  WBC 4.0 - 10.5 K/uL 7.3  9.1  5.8   Hemoglobin 12.0 - 15.0 g/dL 7.8  8.3  8.1   Hematocrit 36.0 - 46.0 % 24.8  25.6  25.2   Platelets 150 - 400 K/uL 203  210  212     Lab Results  Component Value Date   NA 133 (L) 07/03/2024   K 3.6 07/03/2024   CL 98 07/03/2024   CO2 29 07/03/2024     Assessment/Plan: Hemorrhagic and septic shock secondary to perforated bleeding duodenal ulcer with acute blood loss anemia-s/p exploratory laparotomy with Arlyss patch repair on 6/23 at [facility] Recurrent upper GI bleeding with acute blood loss anemia noted on 7/2-CTA negative for extravasation x2-s/p GDA embolization by IR on 7/4. Had stabilized after laparotomy with Arlyss patch repair-however unfortunately developed upper GI bleed-with melanotic stool on 7/2-multiple units of PRBC transfused-multiple CT angiograms were negative-IR performed prophylactic GDA embolization.  Unfortunately patient continued to bleed-per IR-no further embolization targets available as CT angio is negative for extravasation.   GI opinion was sought on 7/8-tentative plans for EGD were made-however general surgery-Dr. Bruce that it was too early for EGD and that risk of recurrent duodenal perforation was very high with insufflation during EGD.  Unfortunately if she were to perforate, she really did not have good surgical options.  It was felt that EGD was to be reserved for a scenario where patient developed hemodynamically significant bleeding or it potentially could be done for continued bleeding if patient was at least 1 month out from her laparotomy.   She was switched to high-dose PPI Protonix  80 mg IV twice daily x 3  days on 7/9, and general surgery started on fixed dose of vasopressin  for 24 hours.  Her hemoglobin has been stable for 48 hours without any requirement for the blood transfusion (last transfusion 7/9).  Thankfully she is now starting to have some brown stools in the ostomy bag.  Continue supportive care-continue to follow CBC and transfuse as needed.  Mechanical fall with left femur fracture-s/p retrograde left femoral nail on 6/20 Per orthopedics-WBAT LLE, knee immobilizer when ambulating, remove staples in 2 weeks.  Follow-up with Dr. Oneil Salvage at [place] 2 weeks from discharge. High risk for VTE-unfortunately unable to use pharmacological prophylaxis-due to above situation.  AKI Likely hemodynamically mediated Resolved  Acute urinary retention Foley catheter remains in place-will perform voiding trial in the next several days-if no further surgical procedures are planned.  HTN BP soft-continue to hold all antihypertensives  HLD Continue to hold Crestor -resume when able-currently n.p.o.  COPD Stable-no wheezing Continue bronchodilators  OSA CPAP nightly  Hypothyroidism Synthroid   History of perforated sigmoid colon-s/p Hartman/colostomy 2017  Debility/deconditioning PT/OT eval-SNF recommended.  Nutrition Status: Nutrition Problem: Increased nutrient needs Etiology: acute illness, wound healing Signs/Symptoms: estimated needs Interventions: Refer to RD note for recommendations   Pressure Ulcer: Agree with assessment and plan as outlined below. Pressure Injury 05/27/24 Sacrum Stage 2 -  Partial thickness loss of dermis presenting as a shallow open injury with a red, pink wound bed without slough. 4 small stage II present, each measures 0.4x 0.4 x 0.2 (Active)  05/27/24 1815  Location: Sacrum  Location Orientation:   Staging: Stage 2 -  Partial thickness loss of dermis presenting as a shallow open injury with a red, pink wound bed without slough.  Wound  Description (Comments): 4 small stage II present, each measures 0.4x 0.4 x 0.2  DO NOT USE:  Present on Admission:   Dressing Type Other (Comment) 07/02/24 2000    Morbid Obesity: Estimated body mass index is 38.52 kg/m as calculated from the following:   Height as of this encounter: 5' 5 (1.651 m).   Weight as of this encounter: 105 kg.   Code status:   Code Status: Full Code   DVT Prophylaxis: Place and maintain sequential compression device Start: 06/18/24 1325 SCDs Start: 06/15/24 1937 Place and maintain sequential compression device Start: 06/15/24 0155 SQ heparin  discontinued 7/2-due to high GI bleeding with ABLA.  Family Communication: Spouse Randall-[phone]-updated 7/9-updated him in great detail over the phone in regards to above situation with ongoing bleed.   Disposition Plan: Status is: Inpatient Remains inpatient appropriate because: Severity of illness   Planned Discharge Destination:Skilled nursing facility   Diet: Diet Order             Diet clear liquid Fluid consistency: Thin  Diet effective now                     Antimicrobial agents: Anti-infectives (From admission, onward)    Start     Dose/Rate Route Frequency Ordered Stop   06/16/24 1800  vancomycin  (VANCOCIN ) IVPB 1000 mg/200 mL premix  Status:  Discontinued        1,000 mg 200 mL/hr over 60 Minutes Intravenous Every 24 hours 06/15/24 1317 06/15/24 1328   06/15/24 1700  vancomycin  (VANCOREADY) IVPB 2000 mg/400 mL  Status:  Discontinued        2,000 mg 200 mL/hr over 120 Minutes Intravenous  Once 06/15/24 1317 06/15/24 1328   06/15/24 1600  fluconazole  (DIFLUCAN ) IVPB 400 mg  Status:  Discontinued        400 mg 100 mL/hr over 120 Minutes Intravenous Every 24 hours 06/15/24 1317 06/16/24 0914   06/15/24 1500  metroNIDAZOLE  (FLAGYL ) IVPB 500 mg  Status:  Discontinued        500 mg 100 mL/hr over 60 Minutes Intravenous Every 12 hours 06/15/24 1216 06/15/24 1255   06/15/24 1400   ciprofloxacin  (CIPRO ) IVPB 400 mg  Status:  Discontinued        400 mg 200 mL/hr over 60 Minutes Intravenous Every 12 hours 06/15/24 1216 06/15/24 1255   06/15/24 1400  piperacillin -tazobactam (ZOSYN ) IVPB 3.375 g        3.375 g 12.5 mL/hr over 240 Minutes Intravenous Every 8 hours 06/15/24 1317 06/20/24 2359   06/15/24 1400  vancomycin  (VANCOCIN ) IVPB 1000 mg/200 mL premix  Status:  Discontinued        1,000 mg 200 mL/hr over 60 Minutes Intravenous Every 24 hours 06/15/24 1328 06/16/24 0914   06/15/24 1333  vancomycin  (VANCOCIN ) 1-5 GM/200ML-% IVPB       Note to Pharmacy: Romona Shaver E: cabinet override      06/15/24 1333 06/15/24 1410   06/15/24 1221  ciprofloxacin  (CIPRO ) 400 MG/200ML IVPB  Status:  Discontinued       Note to Pharmacy: Dannielle Railing S: cabinet override      06/15/24 1221 06/15/24 1343   06/15/24 1221  metroNIDAZOLE  (FLAGYL ) 500 MG/100ML IVPB  Status:  Discontinued       Note to Pharmacy: Dannielle Railing S: cabinet override      06/15/24 1221  06/15/24 1343   06/12/24 1223  clindamycin  (CLEOCIN ) 900 MG/50ML IVPB       Note to Pharmacy: Elaine Nest L: cabinet override      06/12/24 1223 06/12/24 1332   06/12/24 1131  ceFAZolin  (ANCEF ) 2-4 GM/100ML-% IVPB  Status:  Discontinued       Note to Pharmacy: Billy Sluder H: cabinet override      06/12/24 1131 06/12/24 1434   06/11/24 1300  clindamycin  (CLEOCIN ) IVPB 900 mg  Status:  Discontinued        900 mg 100 mL/hr over 30 Minutes Intravenous On call to O.R. 06/10/24 1041 06/10/24 1447   06/11/24 1300  ceFAZolin  (ANCEF ) IVPB 2g/100 mL premix        2 g 200 mL/hr over 30 Minutes Intravenous On call to O.R. 06/10/24 1447 06/12/24 1908        MEDICATIONS: Scheduled Meds:  arformoterol   15 mcg Nebulization Q12H   budesonide  (PULMICORT ) nebulizer solution  0.5 mg Nebulization BID   Chlorhexidine  Gluconate Cloth  6 each Topical Daily   diphenhydrAMINE   25 mg Intravenous Once   feeding supplement  237 mL  Oral BID BM   levothyroxine   200 mcg Oral QAC breakfast   multivitamin with minerals  1 tablet Oral Daily   nystatin    Topical BID   [START ON 07/04/2024] pantoprazole  (PROTONIX ) IV  40 mg Intravenous Q12H   pantoprazole  (PROTONIX ) IV  80 mg Intravenous Q12H   sodium chloride  flush  10-40 mL Intracatheter Q12H   sodium chloride  flush  3 mL Intravenous Q12H   sucralfate   1 g Oral TID WC & HS   Continuous Infusions:  vasopressin  (PITRESSIN) 50 Units in dextrose  5 % 250 mL (0.2 Units/mL) infusion 0.2 Units/min (07/03/24 0800)   PRN Meds:.dextrose , iohexol , ipratropium-albuterol , magic mouthwash w/lidocaine , morphine  injection, naLOXone  (NARCAN )  injection, [DISCONTINUED] ondansetron  **OR** ondansetron  (ZOFRAN ) IV, mouth rinse, oxyCODONE , polyethylene glycol, prochlorperazine , sodium chloride  flush, sodium chloride  flush   I have personally reviewed following labs and imaging studies  LABORATORY DATA: CBC: Recent Labs  Lab 07/01/24 1713 07/02/24 0137 07/02/24 0451 07/02/24 1810 07/03/24 0400  WBC 6.4 5.9 5.8 9.1 7.3  HGB 7.0* 8.1* 8.1* 8.3* 7.8*  HCT 21.8* 24.7* 25.2* 25.6* 24.8*  MCV 94.8 93.6 94.0 94.5 95.8  PLT 194 199 212 210 203    Basic Metabolic Panel: Recent Labs  Lab 06/27/24 0428 06/29/24 0412 07/01/24 0329 07/02/24 0451 07/03/24 0400  NA 134* 133* 134* 134* 133*  K 3.6 3.8 3.6 3.4* 3.6  CL 102 100 100 100 98  CO2 24 27 27 28 29   GLUCOSE 112* 82 90 79 164*  BUN 36* 18 31* 19 14  CREATININE 1.04* 0.87 0.99 0.98 0.83  CALCIUM  8.1* 7.6* 7.6* 7.6* 7.7*    GFR: Estimated Creatinine Clearance: 75.9 mL/min (by C-G formula based on SCr of 0.83 mg/dL).  Liver Function Tests: Recent Labs  Lab 07/01/24 0329  AST 15  ALT 11  ALKPHOS 47  BILITOT 0.4  PROT 3.9*  ALBUMIN  <1.5*    No results for input(s): LIPASE, AMYLASE in the last 168 hours. No results for input(s): AMMONIA in the last 168 hours.  Coagulation Profile: Recent Labs  Lab  06/29/24 1538  INR 1.4*    Cardiac Enzymes: No results for input(s): CKTOTAL, CKMB, CKMBINDEX, TROPONINI in the last 168 hours.  BNP (last 3 results) No results for input(s): PROBNP in the last 8760 hours.  Lipid Profile: No results for input(s): CHOL, HDL,  LDLCALC, TRIG, CHOLHDL, LDLDIRECT in the last 72 hours.   Thyroid  Function Tests: Recent Labs    07/02/24 1810  TSH 39.530*    Anemia Panel: No results for input(s): VITAMINB12, FOLATE, FERRITIN, TIBC, IRON, RETICCTPCT in the last 72 hours.  Urine analysis:    Component Value Date/Time   COLORURINE YELLOW 06/07/2024 2026   APPEARANCEUR HAZY (A) 06/07/2024 2026   LABSPEC 1.015 06/07/2024 2026   PHURINE 5.0 06/07/2024 2026   GLUCOSEU NEGATIVE 06/07/2024 2026   HGBUR NEGATIVE 06/07/2024 2026   BILIRUBINUR NEGATIVE 06/07/2024 2026   KETONESUR NEGATIVE 06/07/2024 2026   PROTEINUR NEGATIVE 06/07/2024 2026   NITRITE NEGATIVE 06/07/2024 2026   LEUKOCYTESUR NEGATIVE 06/07/2024 2026    Sepsis Labs: Lactic Acid, Venous    Component Value Date/Time   LATICACIDVEN 1.0 05/27/2024 1945    MICROBIOLOGY: No results found for this or any previous visit (from the past 240 hours).   RADIOLOGY STUDIES/RESULTS: CT ANGIO GI BLEED Result Date: 07/01/2024 CLINICAL DATA:  Evaluate for GI bleed. EXAM: CTA ABDOMEN AND PELVIS WITHOUT AND WITH CONTRAST TECHNIQUE: Multidetector CT imaging of the abdomen and pelvis was performed using the standard protocol during bolus administration of intravenous contrast. Multiplanar reconstructed images and MIPs were obtained and reviewed to evaluate the vascular anatomy. RADIATION DOSE REDUCTION: This exam was performed according to the departmental dose-optimization program which includes automated exposure control, adjustment of the mA and/or kV according to patient size and/or use of iterative reconstruction technique. CONTRAST:  OMNIPAQUE  IOHEXOL  350 MG/ML SOLN  COMPARISON:  CT dated 06/29/2024. FINDINGS: VASCULAR Aorta: Moderate atherosclerotic calcification. No aneurysmal dilatation or dissection. No periaortic fluid collection. Celiac: The celiac artery and its major branches are patent. Coil embolization of the GDA. SMA: Atherosclerotic calcification of the origin of the SMA. The SMA remains patent. Renals: Atherosclerotic calcification of the renal arteries. The renal arteries are patent. Duplicated right renal artery anatomy. IMA: The IMA is patent. Inflow: Mild atherosclerotic calcification. No aneurysmal dilatation or dissection. The iliac arteries are patent. Proximal Outflow: The visualized proximal outflows patent. Veins: The IVC is unremarkable. The SMV, splenic vein, and main portal vein are patent no portal venous gas. Review of the MIP images confirms the above findings. NON-VASCULAR Lower chest: Partially visualized bilateral pleural effusions with associated compressive atelectasis of the adjacent lungs. Pneumonia is not excluded. No intra-abdominal free air.  Trace perihepatic ascites. Hepatobiliary: Slight irregularity of the liver contour may represent changes of cirrhosis. Clinical correlation recommended. Cholecystectomy. No retained calcified stone noted in the central CBD. JP drainage catheter in similar position along the inferior surface of the liver. Trace fluid noted adjacent to the catheter. Pancreas: Unremarkable. No pancreatic ductal dilatation or surrounding inflammatory changes. Spleen: Normal in size without focal abnormality. Adrenals/Urinary Tract: The adrenal glands unremarkable. An 8 mm nonobstructing left renal upper pole calculus. Duplicated right renal collecting system. Similar appearance of a hypoenhancing area in the inferior pole of the right kidney as the prior CT. This may represent an area of infarct or lobar nephronia/developing abscess. Underlying mass is not excluded. Attention on follow-up imaging recommended. Mild right  hydronephrosis. The urinary bladder is decompressed around a Foley catheter. Stomach/Bowel: There is diffuse thickening of the distal stomach, slightly progressed since the prior CT and may represent gastritis. Underlying small gastric ulcer is not excluded. There is contrast in the proximal duodenum which appears relatively unchanged on the precontrast and postcontrast images and may represent retained oral contrast seen within the stomach on the prior  CT. No evidence of active GI bleed on today's exam. Postsurgical changes of the bowel with right anterior colostomy. There is sigmoid diverticulosis. Oral contrast noted in the proximal colon extends into the colostomy. No bowel obstruction. Appendectomy. Lymphatic: No adenopathy. Reproductive: Hysterectomy. Other: Large ventral hernia containing portion of the stomach, large and small bowel. Musculoskeletal: Osteopenia with degenerative changes. Left femoral nail. No acute osseous pathology. Sacral decubitus ulcer. No fluid collection. IMPRESSION: 1. No evidence of active GI bleed on today's exam. 2. Diffuse thickening of the distal stomach, slightly progressed since the prior CT and may represent gastritis. Underlying small gastric ulcer is not excluded. 3. Partially visualized bilateral pleural effusions with associated compressive atelectasis of the adjacent lungs. Pneumonia is not excluded. 4. An 8 mm nonobstructing left renal upper pole calculus. 5.  Aortic Atherosclerosis (ICD10-I70.0). Electronically Signed   By: Vanetta Chou M.D.   On: 07/01/2024 15:52      LOS: 26 days   Donalda Applebaum, MD  [facility]    To contact the attending provider between 7A-7P or the covering provider during after hours 7P-7A, please log into the web site [email or web address] and access using universal [place] password for that web site. If you do not have the password, please call the hospital operator.  07/03/2024, 9:11 AM

## 2024-07-03 NOTE — TOC Progression Note (Signed)
 Transition of Care [facility]) - Progression Note    Patient Details  Name: NIHARIKA SAVINO MRN: [id number] Date of Birth: 21-Jan-1956  [facility] CM/SW Contact  Inocente GORMAN Kindle, LCSW Phone Number: 07/03/2024, 8:53 AM  Clinical Narrative:    CSW continuing to follow for medical stability.    Expected Discharge Plan: Skilled Nursing Facility Barriers to Discharge: Continued Medical Work up, Other (must enter comment) (SNF auth pending)  Expected Discharge Plan and Services In-house Referral: Clinical Social Work Discharge Planning Services: CM Consult Post Acute Care Choice: Skilled Nursing Facility Living arrangements for the past 2 months: Single Family Home                                       Social Determinants of Health (SDOH) Interventions SDOH Screenings   Food Insecurity: No Food Insecurity (06/08/2024)  Housing: Low Risk  (06/08/2024)  Transportation Needs: No Transportation Needs (06/08/2024)  Utilities: Not At Risk (06/08/2024)  Social Connections: Unknown (06/08/2024)  Recent Concern: Social Connections - Moderately Isolated (05/27/2024)  Tobacco Use: High Risk (06/25/2024)    Readmission Risk Interventions    05/27/2024   10:01 PM  Readmission Risk Prevention Plan  Post Dischage Appt Complete  Medication Screening Complete  Transportation Screening Complete

## 2024-07-03 NOTE — Progress Notes (Signed)
 Pt's has still ongoing vasopressin  at 70ml/hr. HR throughout the night is between 30s-50s with 31 as the lowest non-sustained and 55 as the highest. BP were stable, patient is AO x 4 with limited conversation as she wants to go back to sleep. Haven't did skin check over her bottom as she refused turning to sides because of pain. Had asked for pain pill once d/t abdominal pain. No urine output noted post FC removal. Did bladder scan twice, initially 56ml and 39ml succeedingly. No bladder distention noted.

## 2024-07-03 NOTE — Progress Notes (Signed)
 Foley catheter removed as ordered. Drained 700 initially and before removing FC. Purewick in place. Will monitor for urine output post removal

## 2024-07-03 NOTE — Progress Notes (Signed)
 Physical Therapy Wound Treatment Patient Details  Name: Christina Castaneda MRN: [id number] Date of Birth: 1956/12/22  Today's Date: 07/03/2024 Time: 8888-8849 Time Calculation (min): 39 min  Subjective  Subjective Assessment Subjective: Pt worried about GI bleeding Patient and Family Stated Goals: go home Date of Onset:  (unknown) Prior Treatments: cleansing, Dakins soaked gauze, covered by sacral foam  Pain Score:  9/10 pain medication administered ~3 hours earlier and RN reports she will give pain meds as close to 4 hours as possible  Wound Assessment     Wound 06/08/24 1104 Pressure Injury Coccyx Medial Stage 3 -  Full thickness tissue loss. Subcutaneous fat may be visible but bone, tendon or muscle are NOT exposed. (Active)  Wound Image   06/24/24 1400  Site / Wound Assessment Pink;Red;Yellow;Friable;Granulation tissue 07/03/24 1500  Peri-wound Assessment Erythema (blanchable);Maceration;Pink;Denuded 07/03/24 1500  Wound Length (cm) 6.1 cm 06/30/24 1600  Wound Width (cm) 3.7 cm 06/30/24 1600  Wound Surface Area (cm^2) 17.73 cm^2 06/30/24 1600  Wound Depth (cm) 3.1 cm 06/30/24 1600  Wound Volume (cm^3) 36.635 cm^3 06/30/24 1600  Drainage Description Serous 07/03/24 1500  Drainage Amount Moderate 07/03/24 1500  Treatment Cleansed;Debridement (Selective);Irrigation 07/03/24 1500  Dressing Type ABD;Barrier Film (skin prep);Santyl 07/03/24 1500  Dressing Changed Changed 07/02/24 1622  Dressing Status Clean, Dry, Intact 07/03/24 1500  State of Healing Early/partial granulation 07/03/24 1500  % Wound base Red or Granulating 35% 07/03/24 1500  % Wound base Yellow/Fibrinous Exudate 70% 07/03/24 1500  % Wound base Black/Eschar 0% 06/30/24 1600  % Wound base Other/Granulation Tissue (Comment) 0% 06/30/24 1600  Tunneling (cm) 1.5cm at  7:00 06/30/24 1600  Margins Unattached edges (unapproximated) 07/03/24 1500     Wound 06/12/24 1528 Surgical Closed Surgical Incision Thigh Left (Active)   Site / Wound Assessment Dressing in place / Unable to assess 07/03/24 0800  Closure Staples 07/03/24 0800  Drainage Amount None 07/03/24 0800  Dressing Type Compression wrap 06/30/24 0800  Dressing Status Clean, Dry, Intact 06/30/24 0800        Wound Assessment and Plan  Wound Therapy - Assess/Plan/Recommendations Wound Therapy - Clinical Statement: Pt's air mattress was malfunctioning on entry, able to problem solve how to reinflate and called rental agency to come service. Pt had just had surgical procedure and was very upset that she felt MDs were not telling her what was going on. Once pt settled she was agreeable to wound therapy. Santyl has been effective in breaking down necrotic material, and minor amount of slough rmoved from wound bed especially in the area of tunneling. Pt will continue to benefit in selective debridement to reduce bioburden to promote healing. Wound Therapy - Functional Problem List: decrease mobility Factors Delaying/Impairing Wound Healing: Immobility, Multiple medical problems, Polypharmacy, Vascular compromise, Incontinence Hydrotherapy Plan: Debridement, Dressing change, Patient/family education Wound Therapy - Frequency: 2X / week Wound Therapy - Follow Up Recommendations: dressing changes by RN  Wound Therapy Goals- Improve the function of patient's integumentary system by progressing the wound(s) through the phases of wound healing (inflammation - proliferation - remodeling) by: Wound Therapy Goals - Improve the function of patient's integumentary system by progressing the wound(s) through the phases of wound healing by: Decrease Necrotic Tissue to: 50 Decrease Necrotic Tissue - Progress: Progressing toward goal Increase Granulation Tissue to: 50 Increase Granulation Tissue - Progress: Progressing toward goal Improve Drainage Characteristics: Min Improve Drainage Characteristics - Progress: Progressing toward goal Goals/treatment plan/discharge plan  were made with and agreed upon by patient/family: Yes Time  For Goal Achievement: 7 days Wound Therapy - Potential for Goals: Fair  Goals will be updated until maximal potential achieved or discharge criteria met.  Discharge criteria: when goals achieved, discharge from hospital, MD decision/surgical intervention, no progress towards goals, refusal/missing three consecutive treatments without notification or medical reason.  GP     Charges PT Wound Care Charges $Wound Debridement up to 20 cm: < or equal to 20 cm $PT Hydrotherapy Dressing: 2 dressings $PT Hydrotherapy Visit: 1 Visit     Brita Jurgensen B. [name] PT, DPT Acute Rehabilitation Services Please use secure chat or  Call Office [phone]   Almarie KATHEE Fleeta [facility] 07/03/2024, 3:14 PM

## 2024-07-04 DIAGNOSIS — K276 Chronic or unspecified peptic ulcer, site unspecified, with both hemorrhage and perforation: Secondary | ICD-10-CM | POA: Diagnosis not present

## 2024-07-04 LAB — BASIC METABOLIC PANEL WITH GFR
Anion gap: 6 (ref 5–15)
BUN: 12 mg/dL (ref 8–23)
CO2: 28 mmol/L (ref 22–32)
Calcium: 7.8 mg/dL — ABNORMAL LOW (ref 8.9–10.3)
Chloride: 99 mmol/L (ref 98–111)
Creatinine, Ser: 0.76 mg/dL (ref 0.44–1.00)
GFR, Estimated: >60 mL/min (ref >60)
Glucose, Bld: 76 mg/dL (ref 70–99)
Potassium: 3.2 mmol/L — ABNORMAL LOW (ref 3.5–5.1)
Sodium: 133 mmol/L — ABNORMAL LOW (ref 135–145)

## 2024-07-04 LAB — CBC WITH DIFFERENTIAL/PLATELET
Abs Immature Granulocytes: 0.1 K/uL — ABNORMAL HIGH (ref 0.00–0.07)
Basophils Absolute: 0 K/uL (ref 0.0–0.1)
Basophils Relative: 0 %
Eosinophils Absolute: 0.1 K/uL (ref 0.0–0.5)
Eosinophils Relative: 2 %
HCT: 23.7 % — ABNORMAL LOW (ref 36.0–46.0)
Hemoglobin: 7.5 g/dL — ABNORMAL LOW (ref 12.0–15.0)
Immature Granulocytes: 1 %
Lymphocytes Relative: 21 %
Lymphs Abs: 1.5 K/uL (ref 0.7–4.0)
MCH: 30.5 pg (ref 26.0–34.0)
MCHC: 31.6 g/dL (ref 30.0–36.0)
MCV: 96.3 fL (ref 80.0–100.0)
Monocytes Absolute: 0.6 K/uL (ref 0.1–1.0)
Monocytes Relative: 8 %
Neutro Abs: 5 K/uL (ref 1.7–7.7)
Neutrophils Relative %: 68 %
Platelets: 211 K/uL (ref 150–400)
RBC: 2.46 MIL/uL — ABNORMAL LOW (ref 3.87–5.11)
RDW: 16.5 % — ABNORMAL HIGH (ref 11.5–15.5)
WBC: 7.4 K/uL (ref 4.0–10.5)
nRBC: 0 % (ref 0.0–0.2)

## 2024-07-04 LAB — CORTISOL: Cortisol, Plasma: 21.1 ug/dL

## 2024-07-04 LAB — TSH: TSH: 23.426 u[IU]/mL — ABNORMAL HIGH (ref 0.350–4.500)

## 2024-07-04 LAB — T4, FREE: Free T4: 0.82 ng/dL (ref 0.61–1.12)

## 2024-07-04 LAB — MAGNESIUM: Magnesium: 1.9 mg/dL (ref 1.7–2.4)

## 2024-07-04 LAB — PHOSPHORUS: Phosphorus: 2.6 mg/dL (ref 2.5–4.6)

## 2024-07-04 MED ORDER — MIDODRINE HCL 5 MG PO TABS
10.0000 mg | ORAL_TABLET | Freq: Three times a day (TID) | ORAL | Status: DC
  Administered 2024-07-04 – 2024-07-05 (×3): 10 mg via ORAL
  Filled 2024-07-04 (×3): qty 2

## 2024-07-04 MED ORDER — POTASSIUM CHLORIDE CRYS ER 20 MEQ PO TBCR
40.0000 meq | EXTENDED_RELEASE_TABLET | Freq: Once | ORAL | Status: AC
  Administered 2024-07-04: 40 meq via ORAL
  Filled 2024-07-04: qty 2

## 2024-07-04 MED ORDER — PROSOURCE PLUS PO LIQD
30.0000 mL | Freq: Two times a day (BID) | ORAL | Status: DC
  Administered 2024-07-04 – 2024-07-05 (×2): 30 mL via ORAL
  Filled 2024-07-04 (×3): qty 30

## 2024-07-04 MED ORDER — ALBUMIN HUMAN 25 % IV SOLN
50.0000 g | Freq: Two times a day (BID) | INTRAVENOUS | Status: DC
  Administered 2024-07-04 (×2): 50 g via INTRAVENOUS
  Filled 2024-07-04 (×2): qty 200

## 2024-07-04 MED ORDER — LACTATED RINGERS IV SOLN: INTRAVENOUS | Status: DC

## 2024-07-04 MED ORDER — LEVOTHYROXINE SODIUM 100 MCG/5ML IV SOLN
100.0000 ug | Freq: Every day | INTRAVENOUS | Status: DC
  Administered 2024-07-04 – 2024-07-06 (×3): 100 ug via INTRAVENOUS
  Filled 2024-07-04 (×3): qty 5

## 2024-07-04 MED ORDER — POTASSIUM CHLORIDE IN NACL 40-0.9 MEQ/L-% IV SOLN
INTRAVENOUS | Status: AC
  Filled 2024-07-04 (×3): qty 1000

## 2024-07-04 NOTE — Progress Notes (Signed)
 Progress Note  19 Days Post-Op  Subjective: Pt denies worsening abdominal pain. Stool today brown and no melena  Objective: Vital signs in last 24 hours: Temp:  [97.4 F (36.3 C)-97.8 F (36.6 C)] 97.8 F (36.6 C) (07/12 0825) Pulse Rate:  [45-95] 58 (07/12 0825) Resp:  [13-18] 16 (07/12 0825) BP: (80-115)/(37-72) 114/60 (07/12 0825) SpO2:  [94 %-100 %] 100 % (07/12 0825) Weight:  [103.6 kg] 103.6 kg (07/12 0500) Last BM Date : 07/03/24  Intake/Output from previous day: 07/11 0701 - 07/12 0700 In: 1766.6 [P.O.:220; I.V.:1046.6; IV Piggyback:500] Out: 1000 [Urine:825; Stool:175] Intake/Output this shift: No intake/output data recorded.  PE: General: WD, morbidly obese female who is laying in bed in NAD Heart: regular, rate, and rhythm. Lungs: Respiratory effort nonlabored Abd: soft, appropriately ttp, ostomy viable with some soft brown stool present, drain serous, incision C/D/I with staples present    Lab Results:  Recent Labs    07/03/24 0400 07/03/24 2139 07/04/24 0351  WBC 7.3  --  7.4  HGB 7.8* 7.6* 7.5*  HCT 24.8* 23.9* 23.7*  PLT 203  --  211   BMET Recent Labs    07/03/24 0400 07/04/24 0351  NA 133* 133*  K 3.6 3.2*  CL 98 99  CO2 29 28  GLUCOSE 164* 76  BUN 14 12  CREATININE 0.83 0.76  CALCIUM  7.7* 7.8*   PT/INR No results for input(s): LABPROT, INR in the last 72 hours.  CMP     Component Value Date/Time   NA 133 (L) 07/04/2024 0351   K 3.2 (L) 07/04/2024 0351   CL 99 07/04/2024 0351   CO2 28 07/04/2024 0351   GLUCOSE 76 07/04/2024 0351   BUN 12 07/04/2024 0351   CREATININE 0.76 07/04/2024 0351   CALCIUM  7.8 (L) 07/04/2024 0351   PROT 3.9 (L) 07/01/2024 0329   ALBUMIN  <1.5 (L) 07/01/2024 0329   AST 15 07/01/2024 0329   ALT 11 07/01/2024 0329   ALKPHOS 47 07/01/2024 0329   BILITOT 0.4 07/01/2024 0329   GFRNONAA >60 07/04/2024 0351   GFRAA >60 06/05/2016 1320   Lipase     Component Value Date/Time   LIPASE 64 (H)  03/28/2016 1705       Studies/Results: No results found.   Anti-infectives: Anti-infectives (From admission, onward)    Start     Dose/Rate Route Frequency Ordered Stop   06/16/24 1800  vancomycin  (VANCOCIN ) IVPB 1000 mg/200 mL premix  Status:  Discontinued        1,000 mg 200 mL/hr over 60 Minutes Intravenous Every 24 hours 06/15/24 1317 06/15/24 1328   06/15/24 1700  vancomycin  (VANCOREADY) IVPB 2000 mg/400 mL  Status:  Discontinued        2,000 mg 200 mL/hr over 120 Minutes Intravenous  Once 06/15/24 1317 06/15/24 1328   06/15/24 1600  fluconazole  (DIFLUCAN ) IVPB 400 mg  Status:  Discontinued        400 mg 100 mL/hr over 120 Minutes Intravenous Every 24 hours 06/15/24 1317 06/16/24 0914   06/15/24 1500  metroNIDAZOLE  (FLAGYL ) IVPB 500 mg  Status:  Discontinued        500 mg 100 mL/hr over 60 Minutes Intravenous Every 12 hours 06/15/24 1216 06/15/24 1255   06/15/24 1400  ciprofloxacin  (CIPRO ) IVPB 400 mg  Status:  Discontinued        400 mg 200 mL/hr over 60 Minutes Intravenous Every 12 hours 06/15/24 1216 06/15/24 1255   06/15/24 1400  piperacillin -tazobactam (ZOSYN ) IVPB 3.375  g        3.375 g 12.5 mL/hr over 240 Minutes Intravenous Every 8 hours 06/15/24 1317 06/20/24 2359   06/15/24 1400  vancomycin  (VANCOCIN ) IVPB 1000 mg/200 mL premix  Status:  Discontinued        1,000 mg 200 mL/hr over 60 Minutes Intravenous Every 24 hours 06/15/24 1328 06/16/24 0914   06/15/24 1333  vancomycin  (VANCOCIN ) 1-5 GM/200ML-% IVPB       Note to Pharmacy: Romona Shaver E: cabinet override      06/15/24 1333 06/15/24 1410   06/15/24 1221  ciprofloxacin  (CIPRO ) 400 MG/200ML IVPB  Status:  Discontinued       Note to Pharmacy: Dannielle Railing S: cabinet override      06/15/24 1221 06/15/24 1343   06/15/24 1221  metroNIDAZOLE  (FLAGYL ) 500 MG/100ML IVPB  Status:  Discontinued       Note to Pharmacy: Dannielle Railing S: cabinet override      06/15/24 1221 06/15/24 1343   06/12/24 1223   clindamycin  (CLEOCIN ) 900 MG/50ML IVPB       Note to Pharmacy: Elaine Nest L: cabinet override      06/12/24 1223 06/12/24 1332   06/12/24 1131  ceFAZolin  (ANCEF ) 2-4 GM/100ML-% IVPB  Status:  Discontinued       Note to Pharmacy: Billy Sluder H: cabinet override      06/12/24 1131 06/12/24 1434   06/11/24 1300  clindamycin  (CLEOCIN ) IVPB 900 mg  Status:  Discontinued        900 mg 100 mL/hr over 30 Minutes Intravenous On call to O.R. 06/10/24 1041 06/10/24 1447   06/11/24 1300  ceFAZolin  (ANCEF ) IVPB 2g/100 mL premix        2 g 200 mL/hr over 30 Minutes Intravenous On call to O.R. 06/10/24 1447 06/12/24 1908        Assessment/Plan POD 19 , s/p ex lap with graham patch repair and JP drain placement for Perforated duodenal ulcer, possible bleeding ulcer as well, Dr. Johanna at [facility] 6/23, now with melena - IR embolized (7/4) old blood in colostomy noted   - Hgb stable, continue to monitor closely  - h. Pylori negative - will leave her surgical drain for right now to assure no leak from necrosis after AE - Mech soft diet today  - CT GI bleed protocol 7/7- negative for active bleeding but concern for thickening of distal stomach - repeat CT GI bleed 7/9 negative for active bleed - she is too soon post-op to consider EGD at this time due to risk of re-perforation  - in discussions with IR repeat angio not recommended at this time - fixed rate vaso started 7/10 - multi-modal pain control - PT, OOB as able pending femur fx recs - Coninue BID PPI and carafate    ABL anemia, due to above - continue to monitor hgb and transfuse PRN     FEN - Dys 3 diet VTE - on hold due to anemia/bleeding ID - zosyn , completed    LOS: 27 days   I reviewed hospitalist notes, last 24 h vitals and pain scores, last 48 h intake and output, last 24 h labs and trends, and last 24 h imaging results.  This care required moderate level of medical decision making.    Saryah Loper C Missael Ferrari, MD   [facility] 07/04/2024, 10:59 AM Please see Amion for pager number during day hours 7:00am-4:30pm

## 2024-07-04 NOTE — Plan of Care (Signed)

## 2024-07-04 NOTE — Progress Notes (Addendum)
 PROGRESS NOTE        PATIENT DETAILS Name: Christina Castaneda Age: 68 y.o. Sex: female Date of Birth: 1956-10-24 Admit Date: 06/07/2024 Admitting Physician Orlin Fairly, MD ERE:Floopd, Mariano SQUIBB, DO  Brief Summary: Patient is a 68 y.o.  female with history of perforated sigmoid colon-s/p Hartman's/colostomy 2017-who initially presented to [facility] following a mechanical fall-she was found to have left distal femur fracture-underwent retrograde femoral nail on 6/20, unfortunately-further hospital course complicated by perforated duodenal ulcer with acute blood loss anemia resulting in hypovolemic shock-she was evaluated by general surgery and underwent emergent exploratory laparotomy with Graham's patch-postoperatively-she was transferred to [facility] ICU.  She was stabilized and then subsequently transferred to [facility] service.  Unfortunately-further hospital course complicated by recurrent melena through ostomy and acute blood loss anemia-after discussion with CCS and then with interventional radiology-patient underwent on GDA embolization on 7/4.  See below for further details.   Significant events: 6/15>> mechanical fall-left femur fracture/AKI-admit to [facility] at [facility]. 6/20>> retrograde femoral nail by orthopedics at [facility] 6/23>> perforated duodenal ulcer-hypotension-exploratory laparotomy/placement of Arlyss supplies.  Remained intubated postprocedure-transferred to [facility] ICU. 6/24>> extubated 6/25>> off pressors-out of ICU-drop in hemoglobin but nonbloody ostomy output. 6/26>> transferred to [facility] service 7/02>> Black tarry stools in ostomy-Hb down to 6.1-General Surgery reconsulted-too early for EGD-CT angio negative for bleed-IR deferred embolization 7/03 >> continues to have black tarry stools-IR performed GDA embolization. 7/06>>  Hb 6.8-continues to have black stools in ostomy-1 unit of blood ordered. 7/07>> Hb 6.7-CT angiogram negative-1 unit of blood ordered. 7/08>> Hb 6.9-1 unit of  blood ordered.  GI consulted.  CCS discussed with IR-no further embolization options-GDA completely occluded on prior study.  Extensive discussion with CCS/GI-CCS felt, risk of endoscopy was significant-patient felt to be at significant risk of perforation-with no real good operative fix for recurrent perf.  CCS ordered cryoprecipitate.  CCS opined that EGD be reserved only for hemodynamically unstable bleed or consider EGD once patient was at least 1 month out from laparotomy. 7/09>> IR ordered repeat CTA-no targets for embolization as no obvious extravasation.  1 additional PRBC transfusion for Hb of 7.0.  Protonix  dosage increased to 80 mg IV twice daily x 3 days before switching back to 40 mg twice daily. 7/10>> fixed dose vasopressin  infusion started. 7/11>> some brown stools in ostomy  Significant studies: 6/15>> CT left knee: Acute comminuted fracture of distal left femoral shaft. 6/16>> renal ultrasound: No hydronephrosis 6/23>> CT angiogram abdomen: Perforated and bleeding peptic ulcer disease affecting proximal duodenum-appears to be hematoma in the gastric antrum/proximal duodenum. 6/26>> upper GI series with KUB: No evidence of contrast leak. 7/02>> CT angio GI bleed study: No active bleeding. 7/07>> CT angio GI bleed study: No active bleeding 7/09>> CT angio GI bleed; no active bleeding  Significant microbiology data: 6/23>> blood culture: No growth 6/23>> peritoneal fluid: Abundant Candida albicans/moderate Candida glabrata.  Procedures: 6/20>> retrograde femoral nail by orthopedics at [facility] 6/23>> exploratory laparotomy with omental Graham's patch repair 7/03>> GDA embolization.  Consults: GI at [facility] General Surgery at [facility] IR  Subjective:  Patient in bed denies any headache chest or abdominal pain, no nausea, no focal weakness.  Objective: Vitals: Blood pressure 114/60, pulse (!) 58, temperature 97.8 F (36.6 C), temperature source Oral, resp. rate 16,  height 5' 5 (1.651 m), weight 103.6 kg, last menstrual period  11/18/2012, SpO2 100%.   Exam:  Awake Alert, No new F.N deficits, Normal affect, right arm PICC line, colostomy, JP drain [place].AT,PERRAL Supple Neck, No JVD,   Symmetrical Chest wall movement, Good air movement bilaterally, CTAB RRR,No Gallops, Rubs or new Murmurs,  +ve B.Sounds, Abd Soft, No tenderness,   No Cyanosis, Clubbing or edema    Assessment/Plan:  Hemorrhagic and septic shock secondary to perforated bleeding duodenal ulcer with acute blood loss anemia-s/p exploratory laparotomy with Arlyss patch repair on 6/23 at [facility] Recurrent upper GI bleeding with acute blood loss anemia noted on 7/2-CTA negative for extravasation x2-s/p GDA embolization by IR on 7/4. Had stabilized after laparotomy with Arlyss patch repair-however unfortunately developed upper GI bleed-with melanotic stool on 7/2-multiple units of PRBC transfused-multiple CT angiograms were negative-IR performed prophylactic GDA embolization.  Unfortunately patient continued to bleed-per IR-no further embolization targets available as CT angio is negative for extravasation.   GI opinion was sought on 7/8-tentative plans for EGD were made-however general surgery-Dr. Bruce that it was too early for EGD and that risk of recurrent duodenal perforation was very high with insufflation during EGD.  Unfortunately if she were to perforate, she really did not have good surgical options.  It was felt that EGD was to be reserved for a scenario where patient developed hemodynamically significant bleeding or it potentially could be done for continued bleeding if patient was at least 1 month out from her laparotomy.    She was switched to high-dose PPI Protonix  80 mg IV twice daily x 3 days on 7/9, and general surgery started on fixed dose of vasopressin  for 24 hours which stopped evening of 07/03/2024.  Her hemoglobin has been stable for 48 hours without any requirement for the  blood transfusion (last transfusion 7/9).  Thankfully she is now starting to have some brown stools in the ostomy bag.  Continue supportive care-continue to follow CBC and transfuse as needed.    Mechanical fall with left femur fracture-s/p retrograde left femoral nail on 6/20 Per orthopedics-WBAT LLE, knee immobilizer when ambulating, remove staples in 2 weeks.  Follow-up with Dr. Oneil Salvage at [facility] 2 weeks from discharge. High risk for VTE-unfortunately unable to use pharmacological prophylaxis-due to above situation.  AKI Likely hemodynamically mediated Resolved  Acute urinary retention Foley catheter remains in place-will perform voiding trial in the next several days-if no further surgical procedures are planned.  HTN currently hypotensive TSH as below, add IV albumin , oral protein supplementation, midodrine , TED stockings, hydrate intermittently, monitor.  Severe hypothyroidism with hypotension Switch to IV Synthroid  on 07/04/2024, repeat TSH and free T4 in 5 to 7 days to monitor trend  HLD Continue to hold Crestor -resume when able-currently n.p.o.  COPD Stable-no wheezing Continue bronchodilators  OSA CPAP nightly  History of perforated sigmoid colon-s/p Hartman/colostomy 2017  Debility/deconditioning PT/OT eval-SNF recommended.  Nutrition Status: Nutrition Problem: Increased nutrient needs Etiology: acute illness, wound healing Signs/Symptoms: estimated needs Interventions: Refer to RD note for recommendations   Pressure Ulcer: Agree with assessment and plan as outlined below. Pressure Injury 05/27/24 Sacrum Stage 2 -  Partial thickness loss of dermis presenting as a shallow open injury with a red, pink wound bed without slough. 4 small stage II present, each measures 0.4x 0.4 x 0.2 (Active)  05/27/24 1815  Location: Sacrum  Location Orientation:   Staging: Stage 2 -  Partial thickness loss of dermis presenting as a shallow open injury with a red,  pink wound bed without slough.  Wound Description (Comments):  4 small stage II present, each measures 0.4x 0.4 x 0.2  DO NOT USE:  Present on Admission:   Dressing Type Other (Comment) 07/03/24 2000    Morbid Obesity: Estimated body mass index is 38.01 kg/m as calculated from the following:   Height as of this encounter: 5' 5 (1.651 m).   Weight as of this encounter: 103.6 kg.   Code status:   Code Status: Full Code   DVT Prophylaxis: Place TED hose Start: 07/04/24 1030 Place and maintain sequential compression device Start: 06/18/24 1325 SCDs Start: 06/15/24 1937 Place and maintain sequential compression device Start: 06/15/24 0155 SQ heparin  discontinued 7/2-due to high GI bleeding with ABLA.  Family Communication: Spouse Randall-[phone]-updated 7/9-updated him in great detail over the phone in regards to above situation with ongoing bleed.   Disposition Plan: Status is: Inpatient Remains inpatient appropriate because: Severity of illness   Planned Discharge Destination:Skilled nursing facility   Diet: Diet Order             Diet full liquid Room service appropriate? Yes; Fluid consistency: Thin  Diet effective now                    Data Review:   Inpatient Medications  Scheduled Meds:  arformoterol   15 mcg Nebulization Q12H   budesonide  (PULMICORT ) nebulizer solution  0.5 mg Nebulization BID   Chlorhexidine  Gluconate Cloth  6 each Topical Daily   diphenhydrAMINE   25 mg Intravenous Once   feeding supplement  237 mL Oral BID BM   levothyroxine   200 mcg Oral QAC breakfast   midodrine   10 mg Oral TID WC   multivitamin with minerals  1 tablet Oral Daily   nystatin    Topical BID   pantoprazole  (PROTONIX ) IV  40 mg Intravenous Q12H   sodium chloride  flush  10-40 mL Intracatheter Q12H   sucralfate   1 g Oral TID WC & HS   Continuous Infusions:  0.9 % NaCl with KCl 40 mEq / L     PRN Meds:.dextrose , iohexol , ipratropium-albuterol , magic mouthwash  w/lidocaine , morphine  injection, naLOXone  (NARCAN )  injection, [DISCONTINUED] ondansetron  **OR** ondansetron  (ZOFRAN ) IV, mouth rinse, oxyCODONE , polyethylene glycol, prochlorperazine   DVT Prophylaxis  Place TED hose Start: 07/04/24 1030 Place and maintain sequential compression device Start: 06/18/24 1325 SCDs Start: 06/15/24 1937 Place and maintain sequential compression device Start: 06/15/24 0155   Recent Labs  Lab 07/02/24 0137 07/02/24 0451 07/02/24 1810 07/03/24 0400 07/03/24 2139 07/04/24 0351  WBC 5.9 5.8 9.1 7.3  --  7.4  HGB 8.1* 8.1* 8.3* 7.8* 7.6* 7.5*  HCT 24.7* 25.2* 25.6* 24.8* 23.9* 23.7*  PLT 199 212 210 203  --  211  MCV 93.6 94.0 94.5 95.8  --  96.3  MCH 30.7 30.2 30.6 30.1  --  30.5  MCHC 32.8 32.1 32.4 31.5  --  31.6  RDW 17.5* 17.4* 17.2* 16.7*  --  16.5*  LYMPHSABS  --   --   --   --   --  1.5  MONOABS  --   --   --   --   --  0.6  EOSABS  --   --   --   --   --  0.1  BASOSABS  --   --   --   --   --  0.0    Recent Labs  Lab 06/29/24 0412 06/29/24 1538 07/01/24 0329 07/02/24 0451 07/02/24 1810 07/03/24 0400 07/04/24 0351  NA 133*  --  134* 134*  --  133* 133*  K 3.8  --  3.6 3.4*  --  3.6 3.2*  CL 100  --  100 100  --  98 99  CO2 27  --  27 28  --  29 28  ANIONGAP 6  --  7 6  --  6 6  GLUCOSE 82  --  90 79  --  164* 76  BUN 18  --  31* 19  --  14 12  CREATININE 0.87  --  0.99 0.98  --  0.83 0.76  AST  --   --  15  --   --   --   --   ALT  --   --  11  --   --   --   --   ALKPHOS  --   --  47  --   --   --   --   BILITOT  --   --  0.4  --   --   --   --   ALBUMIN   --   --  <1.5*  --   --   --   --   INR  --  1.4*  --   --   --   --   --   TSH  --   --   --   --  39.530*  --   --   MG  --   --   --   --   --   --  1.9  PHOS  --   --   --   --   --   --  2.6  CALCIUM  7.6*  --  7.6* 7.6*  --  7.7* 7.8*      Recent Labs  Lab 06/29/24 0412 06/29/24 1538 07/01/24 0329 07/02/24 0451 07/02/24 1810 07/03/24 0400 07/04/24 0351  INR   --  1.4*  --   --   --   --   --   TSH  --   --   --   --  39.530*  --   --   MG  --   --   --   --   --   --  1.9  CALCIUM  7.6*  --  7.6* 7.6*  --  7.7* 7.8*    --------------------------------------------------------------------------------------------------------------- Lab Results  Component Value Date   TRIG 116 06/22/2024    No results found for: HGBA1C Recent Labs    07/02/24 1810  TSH 39.530*   No results for input(s): VITAMINB12, FOLATE, FERRITIN, TIBC, IRON, RETICCTPCT in the last 72 hours. ------------------------------------------------------------------------------------------------------------------ Cardiac Enzymes No results for input(s): CKMB, TROPONINI, MYOGLOBIN in the last 168 hours.  Invalid input(s): CK  Micro Results No results found for this or any previous visit (from the past 240 hours).  Radiology Reports  No results found.    Signature  -   Lavada Stank M.D on 07/04/2024 at 10:30 AM   -  To page go to [email or web address]

## 2024-07-04 NOTE — Progress Notes (Signed)
 Blood pressure throughout the night has been soft managed with IV bolus and continuous IVF. Last 2 readings looks good, at 0500 115/59 (76) and at 0600 114/67 (80). No complaints aside from ankle pain managed with oxycodone . Urine output was , no output on JP drain and ostomy noted. K+ 3.2 1 dose KCL tab given.

## 2024-07-04 NOTE — TOC Progression Note (Signed)
 Transition of Care [facility]) - Progression Note    Patient Details  Name: Christina Castaneda MRN: [id number] Date of Birth: [birth year]  [facility] CM/SW Contact  Isaiah Public, LCSWA Phone Number: 07/04/2024, 4:00 PM  Clinical Narrative:     CSW continues to for medical stability. Patient will require a new insurance authorization for SNF.   Expected Discharge Plan: Skilled Nursing Facility Barriers to Discharge: Continued Medical Work up, Other (must enter comment) (SNF auth pending)  Expected Discharge Plan and Services In-house Referral: Clinical Social Work Discharge Planning Services: CM Consult Post Acute Care Choice: Skilled Nursing Facility Living arrangements for the past 2 months: Single Family Home                                       Social Determinants of Health (SDOH) Interventions SDOH Screenings   Food Insecurity: No Food Insecurity (06/08/2024)  Housing: Low Risk  (06/08/2024)  Transportation Needs: No Transportation Needs (06/08/2024)  Utilities: Not At Risk (06/08/2024)  Social Connections: Unknown (06/08/2024)  Recent Concern: Social Connections - Moderately Isolated (05/27/2024)  Tobacco Use: High Risk (06/25/2024)    Readmission Risk Interventions    05/27/2024   10:01 PM  Readmission Risk Prevention Plan  Post Dischage Appt Complete  Medication Screening Complete  Transportation Screening Complete

## 2024-07-04 NOTE — Plan of Care (Signed)
  Problem: Education: Goal: Knowledge of General Education information will improve Description: Including pain rating scale, medication(s)/side effects and non-pharmacologic comfort measures Outcome: Progressing   Problem: Health Behavior/Discharge Planning: Goal: Ability to manage health-related needs will improve Outcome: Progressing   Problem: Clinical Measurements: Goal: Ability to maintain clinical measurements within normal limits will improve Outcome: Progressing Goal: Will remain free from infection Outcome: Progressing Goal: Diagnostic test results will improve Outcome: Progressing Goal: Respiratory complications will improve Outcome: Progressing Goal: Cardiovascular complication will be avoided Outcome: Progressing   Problem: Activity: Goal: Risk for activity intolerance will decrease Outcome: Progressing   Problem: Nutrition: Goal: Adequate nutrition will be maintained Outcome: Progressing   Problem: Coping: Goal: Level of anxiety will decrease Outcome: Progressing   Problem: Elimination: Goal: Will not experience complications related to bowel motility Outcome: Progressing Goal: Will not experience complications related to urinary retention Outcome: Progressing   Problem: Pain Managment: Goal: General experience of comfort will improve and/or be controlled Outcome: Progressing   Problem: Safety: Goal: Ability to remain free from injury will improve Outcome: Progressing   Problem: Skin Integrity: Goal: Risk for impaired skin integrity will decrease Outcome: Progressing   Problem: Activity: Goal: Ability to tolerate increased activity will improve Outcome: Progressing   Problem: Respiratory: Goal: Ability to maintain a clear airway and adequate ventilation will improve Outcome: Progressing   Problem: Activity: Goal: Ability to return to baseline activity level will improve Outcome: Progressing   Problem: Cardiovascular: Goal: Ability to  achieve and maintain adequate cardiovascular perfusion will improve Outcome: Progressing

## 2024-07-05 ENCOUNTER — Inpatient Hospital Stay (HOSPITAL_COMMUNITY)

## 2024-07-05 DIAGNOSIS — K276 Chronic or unspecified peptic ulcer, site unspecified, with both hemorrhage and perforation: Secondary | ICD-10-CM | POA: Diagnosis not present

## 2024-07-05 DIAGNOSIS — J9811 Atelectasis: Secondary | ICD-10-CM | POA: Diagnosis not present

## 2024-07-05 DIAGNOSIS — J9621 Acute and chronic respiratory failure with hypoxia: Secondary | ICD-10-CM | POA: Diagnosis not present

## 2024-07-05 DIAGNOSIS — R1310 Dysphagia, unspecified: Secondary | ICD-10-CM | POA: Diagnosis not present

## 2024-07-05 LAB — CBC WITH DIFFERENTIAL/PLATELET
Abs Immature Granulocytes: 0.06 K/uL (ref 0.00–0.07)
Basophils Absolute: 0 K/uL (ref 0.0–0.1)
Basophils Relative: 0 %
Eosinophils Absolute: 0.1 K/uL (ref 0.0–0.5)
Eosinophils Relative: 3 %
HCT: 22.4 % — ABNORMAL LOW (ref 36.0–46.0)
Hemoglobin: 7.1 g/dL — ABNORMAL LOW (ref 12.0–15.0)
Immature Granulocytes: 1 %
Lymphocytes Relative: 22 %
Lymphs Abs: 1.2 K/uL (ref 0.7–4.0)
MCH: 30.9 pg (ref 26.0–34.0)
MCHC: 31.7 g/dL (ref 30.0–36.0)
MCV: 97.4 fL (ref 80.0–100.0)
Monocytes Absolute: 0.4 K/uL (ref 0.1–1.0)
Monocytes Relative: 7 %
Neutro Abs: 3.5 K/uL (ref 1.7–7.7)
Neutrophils Relative %: 67 %
Platelets: 212 K/uL (ref 150–400)
RBC: 2.3 MIL/uL — ABNORMAL LOW (ref 3.87–5.11)
RDW: 16.9 % — ABNORMAL HIGH (ref 11.5–15.5)
WBC: 5.3 K/uL (ref 4.0–10.5)
nRBC: 0 % (ref 0.0–0.2)

## 2024-07-05 LAB — BASIC METABOLIC PANEL WITH GFR
Anion gap: 4 — ABNORMAL LOW (ref 5–15)
BUN: 12 mg/dL (ref 8–23)
CO2: 27 mmol/L (ref 22–32)
Calcium: 8.1 mg/dL — ABNORMAL LOW (ref 8.9–10.3)
Chloride: 104 mmol/L (ref 98–111)
Creatinine, Ser: 0.82 mg/dL (ref 0.44–1.00)
GFR, Estimated: >60 mL/min (ref >60)
Glucose, Bld: 77 mg/dL (ref 70–99)
Potassium: 4.3 mmol/L (ref 3.5–5.1)
Sodium: 135 mmol/L (ref 135–145)

## 2024-07-05 LAB — GLUCOSE, CAPILLARY
Glucose-Capillary: 114 mg/dL — ABNORMAL HIGH (ref 70–99)
Glucose-Capillary: 84 mg/dL (ref 70–99)
Glucose-Capillary: 91 mg/dL (ref 70–99)

## 2024-07-05 LAB — PREPARE RBC (CROSSMATCH)

## 2024-07-05 LAB — BLOOD GAS, ARTERIAL
Acid-Base Excess: 6.6 mmol/L — ABNORMAL HIGH (ref 0.0–2.0)
Bicarbonate: 31.9 mmol/L — ABNORMAL HIGH (ref 20.0–28.0)
Drawn by: 418751
O2 Saturation: 98.3 %
Patient temperature: 36.9
pCO2 arterial: 47 mmHg (ref 32–48)
pH, Arterial: 7.44 (ref 7.35–7.45)
pO2, Arterial: 68 mmHg — ABNORMAL LOW (ref 83–108)

## 2024-07-05 LAB — PHOSPHORUS: Phosphorus: 2.2 mg/dL — ABNORMAL LOW (ref 2.5–4.6)

## 2024-07-05 LAB — CBC
HCT: 31.8 % — ABNORMAL LOW (ref 36.0–46.0)
Hemoglobin: 10.2 g/dL — ABNORMAL LOW (ref 12.0–15.0)
MCH: 30.5 pg (ref 26.0–34.0)
MCHC: 32.1 g/dL (ref 30.0–36.0)
MCV: 95.2 fL (ref 80.0–100.0)
Platelets: 264 K/uL (ref 150–400)
RBC: 3.34 MIL/uL — ABNORMAL LOW (ref 3.87–5.11)
RDW: 16.7 % — ABNORMAL HIGH (ref 11.5–15.5)
WBC: 8.1 K/uL (ref 4.0–10.5)
nRBC: 0 % (ref 0.0–0.2)

## 2024-07-05 LAB — MAGNESIUM: Magnesium: 2.1 mg/dL (ref 1.7–2.4)

## 2024-07-05 MED ORDER — FUROSEMIDE 10 MG/ML IJ SOLN
20.0000 mg | Freq: Once | INTRAMUSCULAR | Status: AC
  Administered 2024-07-05: 20 mg via INTRAVENOUS
  Filled 2024-07-05: qty 2

## 2024-07-05 MED ORDER — SODIUM CHLORIDE 0.9% IV SOLUTION: Freq: Once | INTRAVENOUS | Status: AC

## 2024-07-05 MED ORDER — SODIUM CHLORIDE 3 % IN NEBU
4.0000 mL | INHALATION_SOLUTION | Freq: Two times a day (BID) | RESPIRATORY_TRACT | Status: AC
  Administered 2024-07-05 – 2024-07-08 (×5): 4 mL via RESPIRATORY_TRACT
  Filled 2024-07-05 (×2): qty 4
  Filled 2024-07-05: qty 15
  Filled 2024-07-05: qty 4
  Filled 2024-07-05: qty 15
  Filled 2024-07-05: qty 4

## 2024-07-05 MED ORDER — GUAIFENESIN ER 600 MG PO TB12
600.0000 mg | ORAL_TABLET | Freq: Two times a day (BID) | ORAL | Status: DC
  Administered 2024-07-05 – 2024-07-11 (×13): 600 mg via ORAL
  Filled 2024-07-05 (×13): qty 1

## 2024-07-05 MED ORDER — IPRATROPIUM-ALBUTEROL 0.5-2.5 (3) MG/3ML IN SOLN
3.0000 mL | Freq: Four times a day (QID) | RESPIRATORY_TRACT | Status: DC
  Administered 2024-07-05 – 2024-07-07 (×6): 3 mL via RESPIRATORY_TRACT
  Filled 2024-07-05 (×6): qty 3

## 2024-07-05 MED ORDER — LACTATED RINGERS IV BOLUS: 500.0000 mL | Freq: Once | INTRAVENOUS | Status: DC

## 2024-07-05 MED ORDER — MIDODRINE HCL 5 MG PO TABS
5.0000 mg | ORAL_TABLET | Freq: Two times a day (BID) | ORAL | Status: DC
  Administered 2024-07-05 – 2024-07-07 (×5): 5 mg via ORAL
  Filled 2024-07-05 (×5): qty 1

## 2024-07-05 MED ORDER — POTASSIUM PHOSPHATES 15 MMOLE/5ML IV SOLN
30.0000 mmol | Freq: Once | INTRAVENOUS | Status: AC
  Administered 2024-07-05: 30 mmol via INTRAVENOUS
  Filled 2024-07-05: qty 10

## 2024-07-05 MED ORDER — FUROSEMIDE 10 MG/ML IJ SOLN
40.0000 mg | Freq: Once | INTRAMUSCULAR | Status: AC
  Administered 2024-07-05: 40 mg via INTRAVENOUS
  Filled 2024-07-05: qty 4

## 2024-07-05 MED ORDER — ALBUTEROL SULFATE (2.5 MG/3ML) 0.083% IN NEBU
2.5000 mg | INHALATION_SOLUTION | RESPIRATORY_TRACT | Status: DC | PRN
  Administered 2024-07-07 – 2024-07-11 (×4): 2.5 mg via RESPIRATORY_TRACT
  Filled 2024-07-05 (×3): qty 3

## 2024-07-05 MED ORDER — MIDODRINE HCL 5 MG PO TABS
5.0000 mg | ORAL_TABLET | Freq: Three times a day (TID) | ORAL | Status: DC
  Filled 2024-07-05: qty 1

## 2024-07-05 NOTE — Progress Notes (Addendum)
 PROGRESS NOTE        PATIENT DETAILS Name: Christina Castaneda Age: 68 y.o. Sex: female Date of Birth: 05/29/1956 Admit Date: 06/07/2024 Admitting Physician Orlin Fairly, MD ERE:Floopd, Mariano SQUIBB, DO  Brief Summary: Patient is a 68 y.o.  female with history of perforated sigmoid colon-s/p Hartman's/colostomy 2017-who initially presented to [facility] following a mechanical fall-she was found to have left distal femur fracture-underwent retrograde femoral nail on 6/20, unfortunately-further hospital course complicated by perforated duodenal ulcer with acute blood loss anemia resulting in hypovolemic shock-she was evaluated by general surgery and underwent emergent exploratory laparotomy with Graham's patch-postoperatively-she was transferred to [facility] ICU.  She was stabilized and then subsequently transferred to TRH service.  Unfortunately-further hospital course complicated by recurrent melena through ostomy and acute blood loss anemia-after discussion with CCS and then with interventional radiology-patient underwent on GDA embolization on 7/4.  See below for further details.   Significant events: 6/15>> mechanical fall-left femur fracture/AKI-admit to TR[facility] at [facility]. 6/20>> retrograde femoral nail by orthopedics at [facility] 6/23>> perforated duodenal ulcer-hypotension-exploratory laparotomy/placement of Arlyss supplies.  Remained intubated postprocedure-transferred to [facility] ICU. 6/24>> extubated 6/25>> off pressors-out of ICU-drop in hemoglobin but nonbloody ostomy output. 6/26>> transferred to TRH service 7/02>> Black tarry stools in ostomy-Hb down to 6.1-General Surgery reconsulted-too early for EGD-CT angio negative for bleed-IR deferred embolization 7/03 >> continues to have black tarry stools-IR performed GDA embolization. 7/06>>  Hb 6.8-continues to have black stools in ostomy-1 unit of blood ordered. 7/07>> Hb 6.7-CT angiogram negative-1 unit of blood ordered. 7/08>> Hb 6.9-1 unit of  blood ordered.  GI consulted.  CCS discussed with IR-no further embolization options-GDA completely occluded on prior study.  Extensive discussion with CCS/GI-CCS felt, risk of endoscopy was significant-patient felt to be at significant risk of perforation-with no real good operative fix for recurrent perf.  CCS ordered cryoprecipitate.  CCS opined that EGD be reserved only for hemodynamically unstable bleed or consider EGD once patient was at least 1 month out from laparotomy. 7/09>> IR ordered repeat CTA-no targets for embolization as no obvious extravasation.  1 additional PRBC transfusion for Hb of 7.0.  Protonix  dosage increased to 80 mg IV twice daily x 3 days before switching back to 40 mg twice daily. 7/10>> fixed dose vasopressin  infusion started. 7/11>> some brown stools in ostomy  Significant studies: 6/15>> CT left knee: Acute comminuted fracture of distal left femoral shaft. 6/16>> renal ultrasound: No hydronephrosis 6/23>> CT angiogram abdomen: Perforated and bleeding peptic ulcer disease affecting proximal duodenum-appears to be hematoma in the gastric antrum/proximal duodenum. 6/26>> upper GI series with KUB: No evidence of contrast leak. 7/02>> CT angio GI bleed study: No active bleeding. 7/07>> CT angio GI bleed study: No active bleeding 7/09>> CT angio GI bleed; no active bleeding  Significant microbiology data: 6/23>> blood culture: No growth 6/23>> peritoneal fluid: Abundant Candida albicans/moderate Candida glabrata.  Procedures: 6/20>> retrograde femoral nail by orthopedics at [facility] 6/23>> exploratory laparotomy with omental Graham's patch repair 7/03>> GDA embolization.  Consults: GI at [facility] General Surgery at [facility] and [facility] PCCM IR  Subjective:  Patient in bed denies any headache chest or abdominal pain, no nausea, no focal weakness.  Slightly more short of breath today.  Objective: Vitals: Blood pressure (!) 126/55, pulse 74, temperature 97.8 F (36.6 C),  temperature source Oral, resp. rate 20, height 5' 5 (1.651  m), weight 104.2 kg, last menstrual period 11/18/2012, SpO2 99%.   Exam:  Awake Alert, No new F.N deficits, Normal affect, right arm PICC line, colostomy, JP drain [place].AT,PERRAL Supple Neck, No JVD,   Symmetrical Chest wall movement, Good air movement bilaterally, reduced left-sided breath sounds RRR,No Gallops, Rubs or new Murmurs,  +ve B.Sounds, Abd Soft, No tenderness,   No Cyanosis, Clubbing or edema    Assessment/Plan:  Hemorrhagic and septic shock secondary to perforated bleeding duodenal ulcer with acute blood loss anemia-s/p exploratory laparotomy with Arlyss patch repair on 6/23 at [facility] by Dr. Dorothyann Pappayliou  Recurrent upper GI bleeding with acute blood loss anemia noted on 7/2-CTA negative for extravasation x2-s/p GDA embolization by IR on 7/4. Had stabilized after laparotomy with Arlyss patch repair-however unfortunately developed upper GI bleed-with melanotic stool on 7/2-multiple units of PRBC transfused-multiple CT angiograms were negative-IR performed prophylactic GDA embolization.  Unfortunately patient continued to bleed-per IR-no further embolization targets available as CT angio is negative for extravasation.   GI opinion was sought on 7/8-tentative plans for EGD were made-however general surgery-Dr. Bruce that it was too early for EGD and that risk of recurrent duodenal perforation was very high with insufflation during EGD.  Unfortunately if she were to perforate, she really did not have good surgical options.  It was felt that EGD was to be reserved for a scenario where patient developed hemodynamically significant bleeding or it potentially could be done for continued bleeding if patient was at least 1 month out from her laparotomy.    She was switched to high-dose PPI Protonix  80 mg IV twice daily x 3 days on 7/9, and general surgery started on fixed dose of vasopressin  for 24 hours which stopped  evening of 07/03/2024.  Her hemoglobin has been stable for 48 hours without any requirement for the blood transfusion (last transfusion 7/9).  Thankfully she is now starting to have some brown stools in the ostomy bag.  Continue supportive care-continue to follow CBC, 2 more units on 07/05/2024 with Lasix     Mechanical fall with left femur fracture-s/p retrograde left femoral nail on 6/20 Per orthopedics-WBAT LLE, knee immobilizer when ambulating, remove staples in 2 weeks.  Follow-up with Dr. Oneil Salvage at [place] 2 weeks from discharge. High risk for VTE-unfortunately unable to use pharmacological prophylaxis-due to above situation.  AKI Likely hemodynamically mediated Resolved  Shortness of breath with reduced left-sided breath sounds on 07/05/2024.  Chest x-ray shows whiteout, could have mucous plugging versus effusion, will consult pulmonary, I-S flutter valve added.    Note pulmonary critical care following the patient, throughout the day on 07/05/2024 she is getting increasingly more short of breath and fatigued, she is on 3 L nasal cannula oxygen but I think due to left lung being compromised she is getting increasingly hypoxic, placed on BiPAP at 2:40 PM, also talked to the ICU team, we will move her to ICU.  Also complaining of some nonspecific abdominal pain, discussed with general surgery Dr Metger they will address surgical issues and any imaging if needed.  Husband informed.  Acute urinary retention Foley catheter remains in place-will perform voiding trial in the next several days-if no further surgical procedures are planned.  HTN currently hypotensive TSH as below, add IV albumin , oral protein supplementation, midodrine , TED stockings, hydrate intermittently, monitor.  Hypophosphatemia.  Replaced.    Severe hypothyroidism with hypotension Switch to IV Synthroid  on 07/04/2024, repeat TSH and free T4 in 5 to 7 days to monitor trend  HLD Continue to hold Crestor -resume  when able-currently n.p.o.  COPD Stable-no wheezing Continue bronchodilators  OSA CPAP nightly  History of perforated sigmoid colon-s/p Hartman/colostomy 2017  Debility/deconditioning PT/OT eval-SNF recommended.  Nutrition Status: Nutrition Problem: Increased nutrient needs Etiology: acute illness, wound healing Signs/Symptoms: estimated needs Interventions: Refer to RD note for recommendations   Pressure Ulcer: Agree with assessment and plan as outlined below. Pressure Injury 05/27/24 Sacrum Stage 2 -  Partial thickness loss of dermis presenting as a shallow open injury with a red, pink wound bed without slough. 4 small stage II present, each measures 0.4x 0.4 x 0.2 (Active)  05/27/24 1815  Location: Sacrum  Location Orientation:   Staging: Stage 2 -  Partial thickness loss of dermis presenting as a shallow open injury with a red, pink wound bed without slough.  Wound Description (Comments): 4 small stage II present, each measures 0.4x 0.4 x 0.2  DO NOT USE:  Present on Admission:   Dressing Type Other (Comment) 07/04/24 0800    Morbid Obesity: Estimated body mass index is 38.23 kg/m as calculated from the following:   Height as of this encounter: 5' 5 (1.651 m).   Weight as of this encounter: 104.2 kg.   Code status:   Code Status: Full Code   DVT Prophylaxis: Place TED hose Start: 07/04/24 1030 Place and maintain sequential compression device Start: 06/18/24 1325 SCDs Start: 06/15/24 1937 Place and maintain sequential compression device Start: 06/15/24 0155 SQ heparin  discontinued 7/2-due to high GI bleeding with ABLA.  Family Communication: Spouse Randall-[phone]-updated 07/05/2024 at 2:45 PM.   Disposition Plan: Status is: Inpatient Remains inpatient appropriate because: Severity of illness   Planned Discharge Destination:Skilled nursing facility   Diet: Diet Order             DIET DYS 3 Room service appropriate? Yes; Fluid consistency: Thin   Diet effective now                    Data Review:   Inpatient Medications  Scheduled Meds:  (feeding supplement) PROSource Plus  30 mL Oral BID BM   arformoterol   15 mcg Nebulization Q12H   budesonide  (PULMICORT ) nebulizer solution  0.5 mg Nebulization BID   Chlorhexidine  Gluconate Cloth  6 each Topical Daily   diphenhydrAMINE   25 mg Intravenous Once   feeding supplement  237 mL Oral BID BM   furosemide   20 mg Intravenous Once   levothyroxine   100 mcg Intravenous Daily   midodrine   5 mg Oral TID WC   multivitamin with minerals  1 tablet Oral Daily   nystatin    Topical BID   pantoprazole  (PROTONIX ) IV  40 mg Intravenous Q12H   sodium chloride  flush  10-40 mL Intracatheter Q12H   sucralfate   1 g Oral TID WC & HS   Continuous Infusions:  potassium PHOSPHATE  IVPB (in mmol)     PRN Meds:.dextrose , iohexol , ipratropium-albuterol , magic mouthwash w/lidocaine , morphine  injection, naLOXone  (NARCAN )  injection, [DISCONTINUED] ondansetron  **OR** ondansetron  (ZOFRAN ) IV, mouth rinse, oxyCODONE , polyethylene glycol, prochlorperazine   DVT Prophylaxis  Place TED hose Start: 07/04/24 1030 Place and maintain sequential compression device Start: 06/18/24 1325 SCDs Start: 06/15/24 1937 Place and maintain sequential compression device Start: 06/15/24 0155   Recent Labs  Lab 07/02/24 0451 07/02/24 1810 07/03/24 0400 07/03/24 2139 07/04/24 0351 07/05/24 0358  WBC 5.8 9.1 7.3  --  7.4 5.3  HGB 8.1* 8.3* 7.8* 7.6* 7.5* 7.1*  HCT 25.2* 25.6* 24.8* 23.9* 23.7* 22.4*  PLT 212  210 203  --  211 212  MCV 94.0 94.5 95.8  --  96.3 97.4  MCH 30.2 30.6 30.1  --  30.5 30.9  MCHC 32.1 32.4 31.5  --  31.6 31.7  RDW 17.4* 17.2* 16.7*  --  16.5* 16.9*  LYMPHSABS  --   --   --   --  1.5 1.2  MONOABS  --   --   --   --  0.6 0.4  EOSABS  --   --   --   --  0.1 0.1  BASOSABS  --   --   --   --  0.0 0.0    Recent Labs  Lab 06/29/24 1538 07/01/24 0329 07/02/24 0451 07/02/24 1810  07/03/24 0400 07/04/24 0351 07/04/24 1041 07/05/24 0358  NA  --  134* 134*  --  133* 133*  --  135  K  --  3.6 3.4*  --  3.6 3.2*  --  4.3  CL  --  100 100  --  98 99  --  104  CO2  --  27 28  --  29 28  --  27  ANIONGAP  --  7 6  --  6 6  --  4*  GLUCOSE  --  90 79  --  164* 76  --  77  BUN  --  31* 19  --  14 12  --  12  CREATININE  --  0.99 0.98  --  0.83 0.76  --  0.82  AST  --  15  --   --   --   --   --   --   ALT  --  11  --   --   --   --   --   --   ALKPHOS  --  47  --   --   --   --   --   --   BILITOT  --  0.4  --   --   --   --   --   --   ALBUMIN   --  <1.5*  --   --   --   --   --   --   INR 1.4*  --   --   --   --   --   --   --   TSH  --   --   --  39.530*  --   --  23.426*  --   MG  --   --   --   --   --  1.9  --  2.1  PHOS  --   --   --   --   --  2.6  --  2.2*  CALCIUM   --  7.6* 7.6*  --  7.7* 7.8*  --  8.1*      Recent Labs  Lab 06/29/24 1538 07/01/24 0329 07/02/24 0451 07/02/24 1810 07/03/24 0400 07/04/24 0351 07/04/24 1041 07/05/24 0358  INR 1.4*  --   --   --   --   --   --   --   TSH  --   --   --  39.530*  --   --  23.426*  --   MG  --   --   --   --   --  1.9  --  2.1  CALCIUM   --  7.6* 7.6*  --  7.7* 7.8*  --  8.1*    ---------------------------------------------------------------------------------------------------------------  Lab Results  Component Value Date   TRIG 116 06/22/2024    No results found for: HGBA1C Recent Labs    07/04/24 1041 07/04/24 1042  TSH 23.426*  --   FREET4  --  0.82   No results for input(s): VITAMINB12, FOLATE, FERRITIN, TIBC, IRON, RETICCTPCT in the last 72 hours. ------------------------------------------------------------------------------------------------------------------ Cardiac Enzymes No results for input(s): CKMB, TROPONINI, MYOGLOBIN in the last 168 hours.  Invalid input(s): CK  Micro Results No results found for this or any previous visit (from the past 240  hours).  Radiology Reports  DG Chest Port 1 View Result Date: 07/05/2024 CLINICAL DATA:  Shortness of breath. EXAM: PORTABLE CHEST 1 VIEW COMPARISON:  06/15/2024 FINDINGS: Interval development of whiteout of the left hemithorax with some shift of cardiomediastinal anatomy to the left. Imaging features suggest underlying component of left lung collapse although air bronchograms in the left lower lung suggest associated consolidative disease. Probable associated left effusion. There is a tiny right pleural effusion. Vascular congestion noted in the right lung. Right PICC line tip overlies the mediastinum. IMPRESSION: 1. Interval development of whiteout of the left hemithorax with some shift of cardiomediastinal anatomy to the left. Imaging features suggest underlying component of left lung collapse although air bronchograms in the left lower lung suggest associated consolidative disease. Probable associated left effusion. 2. Tiny right pleural effusion. 3. Vascular congestion in the right lung. Electronically Signed   By: Camellia Candle M.D.   On: 07/05/2024 08:13      Signature  -   Lavada Stank M.D on 07/05/2024 at 10:19 AM   -  To page go to [email or web address]

## 2024-07-05 NOTE — Plan of Care (Signed)
   Problem: Education: Goal: Knowledge of General Education information will improve Description: Including pain rating scale, medication(s)/side effects and non-pharmacologic comfort measures Outcome: Progressing   Problem: Health Behavior/Discharge Planning: Goal: Ability to manage health-related needs will improve Outcome: Progressing   Problem: Clinical Measurements: Goal: Will remain free from infection Outcome: Progressing

## 2024-07-05 NOTE — Consult Note (Signed)
 NAME:  Christina Castaneda, MRN:  [id number], DOB:  07/19/1956, LOS: 28 ADMISSION DATE:  06/07/2024, CONSULTATION DATE:  06/15/24 REFERRING MD:  Vicci, CHIEF COMPLAINT:  pneumoperitoneum // shock   History of Present Illness:   68 yo F PMH of obesity, perf sigmoid colon s/p hartmann, colostomy revision, HTN, OSA, COPD who was admitted to [facility] at [facility] 6/15 after presenting for a fall. Found to have a L distal femur fx related to the fall.  Underwent retrograde left femoral nail with orthopedics on 6/20.  Then course complicated by hemorrhagic shock in the setting of a perforated duodenal ulcer.  She underwent emergent exploratory laparotomy with Graham's patch 6/23.  She stabilized but has unfortunately continued to have recurrent melena through her ostomy with acute blood loss anemia.  Multiple angiographies to evaluate have been performed and ultimately GDA embolization by IR on 7/4.  GI following but no EGD would carry significant risk for recurrent perforation, deferred for now.  On high-dose PPI twice daily.  Has a dysphagia 3 diet ordered.  PCCM re-consulted on 7/13 for increased shortness of breath.  Chest x-ray revealed complete left-sided atelectasis. She reports that she is having abdominal pain, some nausea.  Maybe some increased shortness of breath although overall comfortable respiratory pattern.  Last time she used her CPAP is unclear.  She says that she used it a few nights ago, but the flowsheet would suggest she has not used it since 6/27  Pertinent  Medical History  Prior sigmoid perf s/p hartmanns Colostomy revision HTN Hypothyroidism   Significant Hospital Events: Including procedures, antibiotic start and stop dates in addition to other pertinent events   6/15 admit to [facility] after fall days earlier, L femur fx and AKI 6/20 OR w ortho 6/23 hgb drop, shock -- found to have perforated bleeding duodenal ulcer  6/24 extubated 6/25 off pressors, txf out of ICU  7/13 PCCM reconsulted  for complete left lung atelectasis  Interim History / Subjective:   She has been on 3 L/min for the last week, now requiring 4 L/min     Objective    Blood pressure (!) 126/55, pulse 74, temperature 97.8 F (36.6 C), temperature source Oral, resp. rate 20, height 5' 5 (1.651 m), weight 104.2 kg, last menstrual period 11/18/2012, SpO2 99%.        Intake/Output Summary (Last 24 hours) at 07/05/2024 1059 Last data filed at 07/05/2024 1004 Gross per 24 hour  Intake 487 ml  Output 1500 ml  Net -1013 ml   Filed Weights   07/02/24 0521 07/04/24 0500 07/05/24 0400  Weight: 105 kg 103.6 kg 104.2 kg    Examination: General: Obese ill-appearing woman laying leaning to the left side.  Comfortable respiratory pattern HEENT: Oropharynx clear, moist.  Strong voice, no secretions, no cough or sputum Neuro: Awake and alert, interacts appropriately, follows commands PULM: Little to no air movement on the left, clear on the right CV: Regular, distant, no murmur GI: Obese, ostomy with brown stool Skin: No rash  Resolved problem list     Assessment and Plan   Active respiratory issues:  Acute on chronic hypoxemic respiratory failure Complete left lung atelectasis, likely mucous plugging, without any overt evidence for pneumonia Dysphagia with risk for aspiration and poor secretion clearance OSA with poor CPAP compliance COPD with continued tobacco use -Need to aggressively push pulmonary hygiene, secretion clearance.  Add chest PT, I-S, flutter valve, guaifenesin  -Suspect that untreated OSA and patient with obesity, debilitation, poor abdominal  compliance all contributing.  Would be optimal if she could tolerate CPAP but she has not been able in the past. - Careful with sedating medications, oxycodone  which will suppress respiratory drive and secretion clearance - On scheduled Brovana /Pulmicort .  Has DuoNeb available as needed.  Will temporarily stop the Brovana , change DuoNeb to 4  times daily on a schedule - No fever, no leukocytosis to support pneumonia. - Follow chest x-ray.  If she fails to open up on the left then would be reasonable to consider bronchoscopy to help with secretion clearance.  Other issues: Chronic hemorrhage and presentation with hemorrhagic shock due to duodenal ulcer.  Multiple interventions Mechanical fall with left femoral fracture repaired on 6/20 Hyperlipidemia Urinary retention AKI resolved   Best Practice (right click and Reselect all SmartList Selections daily)   Diet/type: dysphagia diet (see orders) DVT prophylaxis SCD Pressure ulcer(s): present on admission  GI prophylaxis: PPI Lines: Central line PICC RUE Foley:  N/A Code Status:  full code Last date of multidisciplinary goals of care discussion [pending]    Labs   CBC: Recent Labs  Lab 07/02/24 0451 07/02/24 1810 07/03/24 0400 07/03/24 2139 07/04/24 0351 07/05/24 0358  WBC 5.8 9.1 7.3  --  7.4 5.3  NEUTROABS  --   --   --   --  5.0 3.5  HGB 8.1* 8.3* 7.8* 7.6* 7.5* 7.1*  HCT 25.2* 25.6* 24.8* 23.9* 23.7* 22.4*  MCV 94.0 94.5 95.8  --  96.3 97.4  PLT 212 210 203  --  211 212    Basic Metabolic Panel: Recent Labs  Lab 07/01/24 0329 07/02/24 0451 07/03/24 0400 07/04/24 0351 07/05/24 0358  NA 134* 134* 133* 133* 135  K 3.6 3.4* 3.6 3.2* 4.3  CL 100 100 98 99 104  CO2 27 28 29 28 27   GLUCOSE 90 79 164* 76 77  BUN 31* 19 14 12 12   CREATININE 0.99 0.98 0.83 0.76 0.82  CALCIUM  7.6* 7.6* 7.7* 7.8* 8.1*  MG  --   --   --  1.9 2.1  PHOS  --   --   --  2.6 2.2*   GFR: Estimated Creatinine Clearance: 76.5 mL/min (by C-G formula based on SCr of 0.82 mg/dL). Recent Labs  Lab 07/02/24 1810 07/03/24 0400 07/04/24 0351 07/05/24 0358  WBC 9.1 7.3 7.4 5.3    Liver Function Tests: Recent Labs  Lab 07/01/24 0329  AST 15  ALT 11  ALKPHOS 47  BILITOT 0.4  PROT 3.9*  ALBUMIN  <1.5*   No results for input(s): LIPASE, AMYLASE in the last 168  hours. No results for input(s): AMMONIA in the last 168 hours.  ABG    Component Value Date/Time   PHART 7.365 06/15/2024 2127   PCO2ART 38.2 06/15/2024 2127   PO2ART 134 (H) 06/15/2024 2127   HCO3 22.0 06/15/2024 2127   TCO2 23 06/15/2024 2127   ACIDBASEDEF 3.0 (H) 06/15/2024 2127   O2SAT 99 06/15/2024 2127     Coagulation Profile: Recent Labs  Lab 06/29/24 1538  INR 1.4*     Lamar Chris, MD, PhD 07/05/2024, 11:17 AM [facility] [phone] or if no answer before 7:00PM call [phone] For any issues after 7:00PM please call eLink [phone]

## 2024-07-05 NOTE — Progress Notes (Signed)
 07/05/2024 Briefly on BIPAP Now off Wants people to stop bugging her Abd exam fine She is mentating and ext warm Has large layering effusions on US  along with volume loss on left likely mucus plug left mainstem ABG with normal acid base status and some hypoxemia No role for BIPAP No indication for CT, have reached out to surgery to confirm but their note congruent with this.  Should hemodynamics worsen we can consider. Patient agreeable to plan and would like some rest. Will do some CPT and HTS nebs.   Rolan Claudene HERO

## 2024-07-05 NOTE — Progress Notes (Signed)
 Per Dr. Claudene pt not to be placed on bipap at this time. RT will continue to monitor and be available as needed.

## 2024-07-05 NOTE — Progress Notes (Signed)
 I was notified by the primary team that the patient was complaining of worsening abdominal pain. I arrived to the bedside to evaluate.   On exam, patient is resting in bed. She does not appear to be in acute distress. She reports that she is having diffuse abdominal pain that has been building throughout the day.   PE Gen: female, mildly uncomfortable CV: HR 80s.  Abdomen: soft, mildly distended, with TTP throughout, no rebound/guarding, no peritoneal signs  A/P: 68 y/o F POD 19 from [facility] patch repair of a perforated duodenal ulcer c/b bleeding s/p AE  - She is at risk for developing intra-abdominal complications, including leak and/or perforation related to the embolization. She is not peritonitic on exam and her vitals are reassuring. Review of her CXR from today shows a new white out of the left lung with a possible small effusion. It is hard to know if this is related to a primary pulmonary issue or may be related to an intra-abdominal complication, however, there is no pneumoperitoneum to suggest a large perforation/leak. - Primary team plans to transfer her to the ICU - It would be reasonable to treat her pain and monitor her exam for now. If she develops clinical changes or pain persists then please obtain CT AP with PO and IV contrast - Surgery will continue to follow  Cordella Idler, MD   General Surgeon [facility], [place]

## 2024-07-05 NOTE — Progress Notes (Signed)
 Progress Note  20 Days Post-Op  Subjective: Pt denies abdominal pain. Stool today brown and no melena  Objective: Vital signs in last 24 hours: Temp:  [97.5 F (36.4 C)-98.3 F (36.8 C)] 97.8 F (36.6 C) (07/13 0800) Pulse Rate:  [49-107] 74 (07/13 0800) Resp:  [12-20] 20 (07/13 0800) BP: (95-126)/(42-67) 126/55 (07/13 0800) SpO2:  [94 %-100 %] 99 % (07/13 0815) Weight:  [104.2 kg] 104.2 kg (07/13 0400) Last BM Date : 07/04/24 (stool in ostomy bag)  Intake/Output from previous day: 07/12 0701 - 07/13 0700 In: 487 [P.O.:477; I.V.:10] Out: 700 [Urine:650; Stool:50] Intake/Output this shift: Total I/O In: -  Out: 800 [Urine:800]  PE: General: WD, morbidly obese female who is laying in bed in NAD Heart: regular, rate, and rhythm. Lungs: Respiratory effort nonlabored Abd: soft, appropriately ttp, ostomy viable with some soft brown stool present, drain serous, incision C/D/I with staples present    Lab Results:  Recent Labs    07/04/24 0351 07/05/24 0358  WBC 7.4 5.3  HGB 7.5* 7.1*  HCT 23.7* 22.4*  PLT 211 212   BMET Recent Labs    07/04/24 0351 07/05/24 0358  NA 133* 135  K 3.2* 4.3  CL 99 104  CO2 28 27  GLUCOSE 76 77  BUN 12 12  CREATININE 0.76 0.82  CALCIUM  7.8* 8.1*   PT/INR No results for input(s): LABPROT, INR in the last 72 hours.  CMP     Component Value Date/Time   NA 135 07/05/2024 0358   K 4.3 07/05/2024 0358   CL 104 07/05/2024 0358   CO2 27 07/05/2024 0358   GLUCOSE 77 07/05/2024 0358   BUN 12 07/05/2024 0358   CREATININE 0.82 07/05/2024 0358   CALCIUM  8.1 (L) 07/05/2024 0358   PROT 3.9 (L) 07/01/2024 0329   ALBUMIN  <1.5 (L) 07/01/2024 0329   AST 15 07/01/2024 0329   ALT 11 07/01/2024 0329   ALKPHOS 47 07/01/2024 0329   BILITOT 0.4 07/01/2024 0329   GFRNONAA >60 07/05/2024 0358   GFRAA >60 06/05/2016 1320   Lipase     Component Value Date/Time   LIPASE 64 (H) 03/28/2016 1705       Studies/Results: DG Chest  Port 1 View Result Date: 07/05/2024 CLINICAL DATA:  Shortness of breath. EXAM: PORTABLE CHEST 1 VIEW COMPARISON:  06/15/2024 FINDINGS: Interval development of whiteout of the left hemithorax with some shift of cardiomediastinal anatomy to the left. Imaging features suggest underlying component of left lung collapse although air bronchograms in the left lower lung suggest associated consolidative disease. Probable associated left effusion. There is a tiny right pleural effusion. Vascular congestion noted in the right lung. Right PICC line tip overlies the mediastinum. IMPRESSION: 1. Interval development of whiteout of the left hemithorax with some shift of cardiomediastinal anatomy to the left. Imaging features suggest underlying component of left lung collapse although air bronchograms in the left lower lung suggest associated consolidative disease. Probable associated left effusion. 2. Tiny right pleural effusion. 3. Vascular congestion in the right lung. Electronically Signed   By: Camellia Candle M.D.   On: 07/05/2024 08:13     Anti-infectives: Anti-infectives (From admission, onward)    Start     Dose/Rate Route Frequency Ordered Stop   06/16/24 1800  vancomycin  (VANCOCIN ) IVPB 1000 mg/200 mL premix  Status:  Discontinued        1,000 mg 200 mL/hr over 60 Minutes Intravenous Every 24 hours 06/15/24 1317 06/15/24 1328   06/15/24 1700  vancomycin  (VANCOREADY) IVPB 2000 mg/400 mL  Status:  Discontinued        2,000 mg 200 mL/hr over 120 Minutes Intravenous  Once 06/15/24 1317 06/15/24 1328   06/15/24 1600  fluconazole  (DIFLUCAN ) IVPB 400 mg  Status:  Discontinued        400 mg 100 mL/hr over 120 Minutes Intravenous Every 24 hours 06/15/24 1317 06/16/24 0914   06/15/24 1500  metroNIDAZOLE  (FLAGYL ) IVPB 500 mg  Status:  Discontinued        500 mg 100 mL/hr over 60 Minutes Intravenous Every 12 hours 06/15/24 1216 06/15/24 1255   06/15/24 1400  ciprofloxacin  (CIPRO ) IVPB 400 mg  Status:  Discontinued         400 mg 200 mL/hr over 60 Minutes Intravenous Every 12 hours 06/15/24 1216 06/15/24 1255   06/15/24 1400  piperacillin -tazobactam (ZOSYN ) IVPB 3.375 g        3.375 g 12.5 mL/hr over 240 Minutes Intravenous Every 8 hours 06/15/24 1317 06/20/24 2359   06/15/24 1400  vancomycin  (VANCOCIN ) IVPB 1000 mg/200 mL premix  Status:  Discontinued        1,000 mg 200 mL/hr over 60 Minutes Intravenous Every 24 hours 06/15/24 1328 06/16/24 0914   06/15/24 1333  vancomycin  (VANCOCIN ) 1-5 GM/200ML-% IVPB       Note to Pharmacy: Romona Shaver E: cabinet override      06/15/24 1333 06/15/24 1410   06/15/24 1221  ciprofloxacin  (CIPRO ) 400 MG/200ML IVPB  Status:  Discontinued       Note to Pharmacy: Dannielle Railing S: cabinet override      06/15/24 1221 06/15/24 1343   06/15/24 1221  metroNIDAZOLE  (FLAGYL ) 500 MG/100ML IVPB  Status:  Discontinued       Note to Pharmacy: Dannielle Railing S: cabinet override      06/15/24 1221 06/15/24 1343   06/12/24 1223  clindamycin  (CLEOCIN ) 900 MG/50ML IVPB       Note to Pharmacy: Elaine Nest L: cabinet override      06/12/24 1223 06/12/24 1332   06/12/24 1131  ceFAZolin  (ANCEF ) 2-4 GM/100ML-% IVPB  Status:  Discontinued       Note to Pharmacy: Billy Sluder H: cabinet override      06/12/24 1131 06/12/24 1434   06/11/24 1300  clindamycin  (CLEOCIN ) IVPB 900 mg  Status:  Discontinued        900 mg 100 mL/hr over 30 Minutes Intravenous On call to O.R. 06/10/24 1041 06/10/24 1447   06/11/24 1300  ceFAZolin  (ANCEF ) IVPB 2g/100 mL premix        2 g 200 mL/hr over 30 Minutes Intravenous On call to O.R. 06/10/24 1447 06/12/24 1908        Assessment/Plan POD 19 , s/p ex lap with graham patch repair and JP drain placement for Perforated duodenal ulcer, possible bleeding ulcer as well, Dr. Johanna at [facility] 6/23, now with melena - IR embolized (7/4) old blood in colostomy noted   - Hgb stable, continue to monitor closely  - h. Pylori negative - will  leave her surgical drain for right now to assure no leak from necrosis after AE - Mech soft diet today  - CT GI bleed protocol 7/7- negative for active bleeding but concern for thickening of distal stomach - repeat CT GI bleed 7/9 negative for active bleed - she is too soon post-op to consider EGD at this time due to risk of re-perforation  - IR: repeat angio not recommended at this time - fixed  rate vaso given for 24h - multi-modal pain control - PT, OOB as able pending femur fx recs - Coninue BID PPI and carafate    ABL anemia, due to above - continue to monitor hgb and transfuse PRN     FEN - Dys 3 diet VTE - on hold due to anemia/bleeding ID - zosyn , completed    LOS: 28 days   I reviewed hospitalist notes, last 24 h vitals and pain scores, last 48 h intake and output, last 24 h labs and trends, and last 24 h imaging results.  This care required moderate level of medical decision making.    Bernarda JAYSON Ned, MD  [facility] 07/05/2024, 10:08 AM Please see Amion for pager number during day hours 7:00am-4:30pm

## 2024-07-05 NOTE — Progress Notes (Signed)
 RT called to bedside by RN due to pt having increased WOB and to give neb treatment. RT arrived at bedside pt resp distress, increased WOB, pt complaining of SOB,SpO2 92% on 4L [place]. RT administered duoneb, pt tolerated well. However no improvement in resp distress. MD notifed. Pt continued to complain SOB and pt stated I'm so tired and if feel like I'm not getting enough air. RT expressed concern of PPV with pts CXR to MD and RT asked MD and CCM MD to confirm next steps. MD and CCM MD instructed to place pt on BIPAP. Pt placed on BIPAP and pt became somnolent, and RT obtained ABG. MD at bedside and CCM MD at bedside to assess pt. No new orders given at this time.      07/05/24 1430  Respiratory Assessment  Assessment Type Assess only  Respiratory Pattern Regular;Labored  Chest Assessment Chest expansion symmetrical  Cough Non-productive  Sputum Amount None  Sputum Specimen Source Spontaneous cough  Bilateral Breath Sounds Diminished  Oxygen Therapy/Pulse Ox  O2 Device (S)  Bi-PAP (per MD)  O2 Therapy Oxygen  O2 Flow Rate (L/min) 4 L/min (bleed in)

## 2024-07-05 NOTE — Progress Notes (Signed)
 PCCM Progress Note  Patient with lethargy and progressive respiratory distress after narcotics, superimposed on all of her other issues contributing to restrictive lung disease, poor secretion management and total left atelectasis.  Placed on BiPAP and tolerating.  Plan to move to the ICU.  She has been experiencing progressive abdominal pain - CT scan abd and pelvis ordered. Will work on completing once we insure resp stability.   ABG    Component Value Date/Time   PHART 7.44 07/05/2024 1445   PCO2ART 47 07/05/2024 1445   PO2ART 68 (L) 07/05/2024 1445   HCO3 31.9 (H) 07/05/2024 1445   TCO2 23 06/15/2024 2127   ACIDBASEDEF 3.0 (H) 06/15/2024 2127   O2SAT 98.3 07/05/2024 1445    Lamar Chris, MD, PhD 07/05/2024, 3:03 PM [facility] [phone] or if no answer before 7:00PM call [phone] For any issues after 7:00PM please call eLink [phone]

## 2024-07-06 ENCOUNTER — Inpatient Hospital Stay (HOSPITAL_COMMUNITY)

## 2024-07-06 DIAGNOSIS — J9601 Acute respiratory failure with hypoxia: Secondary | ICD-10-CM

## 2024-07-06 DIAGNOSIS — R1310 Dysphagia, unspecified: Secondary | ICD-10-CM | POA: Diagnosis not present

## 2024-07-06 DIAGNOSIS — J9811 Atelectasis: Secondary | ICD-10-CM | POA: Diagnosis not present

## 2024-07-06 DIAGNOSIS — K276 Chronic or unspecified peptic ulcer, site unspecified, with both hemorrhage and perforation: Secondary | ICD-10-CM | POA: Diagnosis not present

## 2024-07-06 LAB — BPAM RBC
Blood Product Expiration Date: 202508102359
Blood Product Expiration Date: 202508102359
ISSUE DATE / TIME: 202507130803
ISSUE DATE / TIME: 202507131143
Unit Type and Rh: 5100
Unit Type and Rh: 5100

## 2024-07-06 LAB — MAGNESIUM: Magnesium: 2 mg/dL (ref 1.7–2.4)

## 2024-07-06 LAB — CBC WITH DIFFERENTIAL/PLATELET
Abs Immature Granulocytes: 0.08 K/uL — ABNORMAL HIGH (ref 0.00–0.07)
Basophils Absolute: 0 K/uL (ref 0.0–0.1)
Basophils Relative: 1 %
Eosinophils Absolute: 0.1 K/uL (ref 0.0–0.5)
Eosinophils Relative: 2 %
HCT: 31.1 % — ABNORMAL LOW (ref 36.0–46.0)
Hemoglobin: 10 g/dL — ABNORMAL LOW (ref 12.0–15.0)
Immature Granulocytes: 1 %
Lymphocytes Relative: 19 %
Lymphs Abs: 1.2 K/uL (ref 0.7–4.0)
MCH: 30.7 pg (ref 26.0–34.0)
MCHC: 32.2 g/dL (ref 30.0–36.0)
MCV: 95.4 fL (ref 80.0–100.0)
Monocytes Absolute: 0.5 K/uL (ref 0.1–1.0)
Monocytes Relative: 8 %
Neutro Abs: 4.6 K/uL (ref 1.7–7.7)
Neutrophils Relative %: 69 %
Platelets: 236 K/uL (ref 150–400)
RBC: 3.26 MIL/uL — ABNORMAL LOW (ref 3.87–5.11)
RDW: 16.6 % — ABNORMAL HIGH (ref 11.5–15.5)
WBC: 6.6 K/uL (ref 4.0–10.5)
nRBC: 0 % (ref 0.0–0.2)

## 2024-07-06 LAB — TYPE AND SCREEN
ABO/RH(D): O POS
Antibody Screen: NEGATIVE
Unit division: 0
Unit division: 0

## 2024-07-06 LAB — BASIC METABOLIC PANEL WITH GFR
Anion gap: 8 (ref 5–15)
BUN: 9 mg/dL (ref 8–23)
CO2: 31 mmol/L (ref 22–32)
Calcium: 8.2 mg/dL — ABNORMAL LOW (ref 8.9–10.3)
Chloride: 97 mmol/L — ABNORMAL LOW (ref 98–111)
Creatinine, Ser: 0.87 mg/dL (ref 0.44–1.00)
GFR, Estimated: >60 mL/min (ref >60)
Glucose, Bld: 77 mg/dL (ref 70–99)
Potassium: 3.8 mmol/L (ref 3.5–5.1)
Sodium: 136 mmol/L (ref 135–145)

## 2024-07-06 LAB — GLUCOSE, CAPILLARY
Glucose-Capillary: 71 mg/dL (ref 70–99)
Glucose-Capillary: 74 mg/dL (ref 70–99)
Glucose-Capillary: 87 mg/dL (ref 70–99)
Glucose-Capillary: 89 mg/dL (ref 70–99)
Glucose-Capillary: 89 mg/dL (ref 70–99)

## 2024-07-06 LAB — PHOSPHORUS: Phosphorus: 3.5 mg/dL (ref 2.5–4.6)

## 2024-07-06 MED ORDER — ACETYLCYSTEINE 20 % IN SOLN
4.0000 mL | Freq: Three times a day (TID) | RESPIRATORY_TRACT | Status: DC
  Filled 2024-07-06: qty 4

## 2024-07-06 MED ORDER — ACETAMINOPHEN 325 MG PO TABS
650.0000 mg | ORAL_TABLET | Freq: Four times a day (QID) | ORAL | Status: DC
  Administered 2024-07-06 – 2024-07-11 (×22): 650 mg via ORAL
  Filled 2024-07-06 (×22): qty 2

## 2024-07-06 MED ORDER — LEVOTHYROXINE SODIUM 100 MCG PO TABS
200.0000 ug | ORAL_TABLET | Freq: Every day | ORAL | Status: DC
  Administered 2024-07-07 – 2024-07-11 (×5): 200 ug via ORAL
  Filled 2024-07-06 (×5): qty 2

## 2024-07-06 MED ORDER — ENSURE PLUS HIGH PROTEIN PO LIQD
237.0000 mL | Freq: Three times a day (TID) | ORAL | Status: DC
  Administered 2024-07-06 – 2024-07-11 (×15): 237 mL via ORAL

## 2024-07-06 MED ORDER — LIDOCAINE 5 % EX PTCH: 1.0000 | MEDICATED_PATCH | Freq: Every day | CUTANEOUS | Status: DC | PRN

## 2024-07-06 MED ORDER — PANTOPRAZOLE SODIUM 40 MG PO TBEC
40.0000 mg | DELAYED_RELEASE_TABLET | Freq: Two times a day (BID) | ORAL | Status: DC
  Administered 2024-07-06 – 2024-07-11 (×10): 40 mg via ORAL
  Filled 2024-07-06 (×10): qty 1

## 2024-07-06 MED ORDER — SODIUM CHLORIDE 0.9 % IV SOLN: INTRAVENOUS | Status: DC

## 2024-07-06 NOTE — Progress Notes (Signed)
 Progress Note  21 Days Post-Op  Subjective: Pt reports she hasn't been awake long enough to know if she is having abdominal pain. Reports stool has remained tan in color through the weekend. Per RN staples were not removed 7/11 as ordered but can remove today. Patient is eating breakfast and reports this is the first meal she has felt like eating, irritated that I am interrupting.   Objective: Vital signs in last 24 hours: Temp:  [97.4 F (36.3 C)-99.4 F (37.4 C)] 98 F (36.7 C) (07/14 0729) Pulse Rate:  [45-92] 70 (07/14 0800) Resp:  [12-22] 17 (07/14 0800) BP: (109-161)/(41-112) 140/92 (07/14 0800) SpO2:  [86 %-100 %] 94 % (07/14 0818) FiO2 (%):  [36 %] 36 % (07/13 1933) Weight:  [104.2 kg] 104.2 kg (07/14 0212) Last BM Date : 07/05/24  Intake/Output from previous day: 07/13 0701 - 07/14 0700 In: 1820.9 [P.O.:240; I.V.:488.5; Blood:750; IV Piggyback:342.4] Out: 3700 [Urine:3700] Intake/Output this shift: No intake/output data recorded.  PE: General: WD, morbidly obese female who is laying in bed in NAD Heart: regular, rate, and rhythm. Lungs: Respiratory effort nonlabored   Lab Results:  Recent Labs    07/05/24 2055 07/06/24 0331  WBC 8.1 6.6  HGB 10.2* 10.0*  HCT 31.8* 31.1*  PLT 264 236   BMET Recent Labs    07/05/24 0358 07/06/24 0331  NA 135 136  K 4.3 3.8  CL 104 97*  CO2 27 31  GLUCOSE 77 77  BUN 12 9  CREATININE 0.82 0.87  CALCIUM  8.1* 8.2*   PT/INR No results for input(s): LABPROT, INR in the last 72 hours.  CMP     Component Value Date/Time   NA 136 07/06/2024 0331   K 3.8 07/06/2024 0331   CL 97 (L) 07/06/2024 0331   CO2 31 07/06/2024 0331   GLUCOSE 77 07/06/2024 0331   BUN 9 07/06/2024 0331   CREATININE 0.87 07/06/2024 0331   CALCIUM  8.2 (L) 07/06/2024 0331   PROT 3.9 (L) 07/01/2024 0329   ALBUMIN  <1.5 (L) 07/01/2024 0329   AST 15 07/01/2024 0329   ALT 11 07/01/2024 0329   ALKPHOS 47 07/01/2024 0329   BILITOT 0.4  07/01/2024 0329   GFRNONAA >60 07/06/2024 0331   GFRAA >60 06/05/2016 1320   Lipase     Component Value Date/Time   LIPASE 64 (H) 03/28/2016 1705       Studies/Results: DG Chest Port 1 View Result Date: 07/05/2024 CLINICAL DATA:  Shortness of breath. EXAM: PORTABLE CHEST 1 VIEW COMPARISON:  06/15/2024 FINDINGS: Interval development of whiteout of the left hemithorax with some shift of cardiomediastinal anatomy to the left. Imaging features suggest underlying component of left lung collapse although air bronchograms in the left lower lung suggest associated consolidative disease. Probable associated left effusion. There is a tiny right pleural effusion. Vascular congestion noted in the right lung. Right PICC line tip overlies the mediastinum. IMPRESSION: 1. Interval development of whiteout of the left hemithorax with some shift of cardiomediastinal anatomy to the left. Imaging features suggest underlying component of left lung collapse although air bronchograms in the left lower lung suggest associated consolidative disease. Probable associated left effusion. 2. Tiny right pleural effusion. 3. Vascular congestion in the right lung. Electronically Signed   By: Camellia Candle M.D.   On: 07/05/2024 08:13     Anti-infectives: Anti-infectives (From admission, onward)    Start     Dose/Rate Route Frequency Ordered Stop   06/16/24 1800  vancomycin  (VANCOCIN ) IVPB  1000 mg/200 mL premix  Status:  Discontinued        1,000 mg 200 mL/hr over 60 Minutes Intravenous Every 24 hours 06/15/24 1317 06/15/24 1328   06/15/24 1700  vancomycin  (VANCOREADY) IVPB 2000 mg/400 mL  Status:  Discontinued        2,000 mg 200 mL/hr over 120 Minutes Intravenous  Once 06/15/24 1317 06/15/24 1328   06/15/24 1600  fluconazole  (DIFLUCAN ) IVPB 400 mg  Status:  Discontinued        400 mg 100 mL/hr over 120 Minutes Intravenous Every 24 hours 06/15/24 1317 06/16/24 0914   06/15/24 1500  metroNIDAZOLE  (FLAGYL ) IVPB 500 mg   Status:  Discontinued        500 mg 100 mL/hr over 60 Minutes Intravenous Every 12 hours 06/15/24 1216 06/15/24 1255   06/15/24 1400  ciprofloxacin  (CIPRO ) IVPB 400 mg  Status:  Discontinued        400 mg 200 mL/hr over 60 Minutes Intravenous Every 12 hours 06/15/24 1216 06/15/24 1255   06/15/24 1400  piperacillin -tazobactam (ZOSYN ) IVPB 3.375 g        3.375 g 12.5 mL/hr over 240 Minutes Intravenous Every 8 hours 06/15/24 1317 06/20/24 2359   06/15/24 1400  vancomycin  (VANCOCIN ) IVPB 1000 mg/200 mL premix  Status:  Discontinued        1,000 mg 200 mL/hr over 60 Minutes Intravenous Every 24 hours 06/15/24 1328 06/16/24 0914   06/15/24 1333  vancomycin  (VANCOCIN ) 1-5 GM/200ML-% IVPB       Note to Pharmacy: Romona Shaver E: cabinet override      06/15/24 1333 06/15/24 1410   06/15/24 1221  ciprofloxacin  (CIPRO ) 400 MG/200ML IVPB  Status:  Discontinued       Note to Pharmacy: Dannielle Railing S: cabinet override      06/15/24 1221 06/15/24 1343   06/15/24 1221  metroNIDAZOLE  (FLAGYL ) 500 MG/100ML IVPB  Status:  Discontinued       Note to Pharmacy: Dannielle Railing S: cabinet override      06/15/24 1221 06/15/24 1343   06/12/24 1223  clindamycin  (CLEOCIN ) 900 MG/50ML IVPB       Note to Pharmacy: Elaine Nest L: cabinet override      06/12/24 1223 06/12/24 1332   06/12/24 1131  ceFAZolin  (ANCEF ) 2-4 GM/100ML-% IVPB  Status:  Discontinued       Note to Pharmacy: Billy Sluder H: cabinet override      06/12/24 1131 06/12/24 1434   06/11/24 1300  clindamycin  (CLEOCIN ) IVPB 900 mg  Status:  Discontinued        900 mg 100 mL/hr over 30 Minutes Intravenous On call to O.R. 06/10/24 1041 06/10/24 1447   06/11/24 1300  ceFAZolin  (ANCEF ) IVPB 2g/100 mL premix        2 g 200 mL/hr over 30 Minutes Intravenous On call to O.R. 06/10/24 1447 06/12/24 1908        Assessment/Plan POD 21 , s/p ex lap with graham patch repair and JP drain placement for Perforated duodenal ulcer, possible  bleeding ulcer as well, Dr. Johanna at [facility] 6/23 - IR embolized (7/4)  - Hgb stable, continue to monitor closely  - h. Pylori negative - will leave her surgical drain for right now but seems reasonable to consider removal soon - soft diet - CT GI bleed protocol 7/7- negative for active bleeding but concern for thickening of distal stomach - repeat CT GI bleed 7/9 negative for active bleed - she is too soon post-op to  consider EGD at this time due to risk of re-perforation, would like patient to be at least 4-6 weeks post-op - IR: repeat angio not recommended at this time - fixed rate vaso given for 24h - multi-modal pain control - PT, OOB as able pending femur fx recs - [place] BID PPI and carafate  - remove staples today   ABL anemia, due to above - continue to monitor hgb and transfuse PRN     FEN - Dys 3 diet VTE - on hold due to anemia/bleeding ID - zosyn , completed    LOS: 29 days   I reviewed hospitalist notes, last 24 h vitals and pain scores, last 48 h intake and output, last 24 h labs and trends, and last 24 h imaging results.  This care required moderate level of medical decision making.    Burnard JONELLE Louder, [facility] Surgery 07/06/2024, 8:40 AM Please see Amion for pager number during day hours 7:00am-4:30pm

## 2024-07-06 NOTE — Progress Notes (Signed)
 NAME:  Christina Castaneda, MRN:  [id number], DOB:  1956/07/11, LOS: 29 ADMISSION DATE:  06/07/2024, CONSULTATION DATE:  06/15/24 REFERRING MD:  Vicci, CHIEF COMPLAINT:  pneumoperitoneum // shock   History of Present Illness:  68 yo F PMH of obesity, perf sigmoid colon s/p hartmann, colostomy revision, HTN, OSA, COPD who was admitted to [facility] at [facility] 6/15 after presenting for a fall. Found to have a L distal femur fx related to the fall.  Underwent retrograde left femoral nail with orthopedics on 6/20.  Then course complicated by hemorrhagic shock in the setting of a perforated duodenal ulcer.  She underwent emergent exploratory laparotomy with Graham's patch 6/23.  She stabilized but has unfortunately continued to have recurrent melena through her ostomy with acute blood loss anemia.  Multiple angiographies to evaluate have been performed and ultimately GDA embolization by IR on 7/4.  GI following but no EGD would carry significant risk for recurrent perforation, deferred for now.  On high-dose PPI twice daily.  Has a dysphagia 3 diet ordered.   PCCM re-consulted on 7/13 for increased shortness of breath.  Chest x-ray revealed complete left-sided atelectasis. She reports that she is having abdominal pain, some nausea.  Maybe some increased shortness of breath although overall comfortable respiratory pattern.  Last time she used her CPAP is unclear.  She says that she used it a few nights ago, but the flowsheet would suggest she has not used it since 6/27  Pertinent  Medical History  Prior sigmoid perf s/p hartmanns Colostomy revision HTN Hypothyroidism   Significant Hospital Events: Including procedures, antibiotic start and stop dates in addition to other pertinent events   6/15 admit to [facility] after fall days earlier, L femur fx and AKI 6/20 OR w ortho 6/23 hgb drop, shock -- found to have perforated bleeding duodenal ulcer  6/24 extubated 6/25 off pressors, txf out of ICU  7/13 PCCM reconsulted  for complete left lung atelectasis 7/14 CXR with some resolution and she is stable so will return to floor  Interim History / Subjective:  4L oxygen by [place] this am still stable. She complains of L chest wall pain and leg cramping.   Objective    Blood pressure (!) 127/56, pulse 83, temperature 97.6 F (36.4 C), temperature source Oral, resp. rate 17, height 5' 5 (1.651 m), weight 104.2 kg, last menstrual period 11/18/2012, SpO2 97%.    FiO2 (%):  [36 %] 36 %   Intake/Output Summary (Last 24 hours) at 07/06/2024 1220 Last data filed at 07/06/2024 1032 Gross per 24 hour  Intake 2100.86 ml  Output 2500 ml  Net -399.14 ml   Filed Weights   07/04/24 0500 07/05/24 0400 07/06/24 9787  Weight: 103.6 kg 104.2 kg 104.2 kg    Examination: General: Obese ill-appearing woman laying in bed.   HEENT: Oropharynx clear, moist.  Strong voice, no secretions, no cough or sputum Neuro: Awake and alert, interacts appropriately, follows commands PULM: Comfortable respiratory pattern on 4L [place]. Diffuse wheezing and equal air movement L and R side. CV: Regular, distant, no murmur GI: Obese, ostomy with brown stool Skin: No rash  Resolved problem list   Assessment and Plan   Acute hypoxemic respiratory failure - 4L [place] Complete left lung atelectasis - improving Likely mucous plugging, without any overt evidence for pneumonia Dysphagia with risk for aspiration and poor secretion clearance OSA with poor CPAP compliance COPD with continued tobacco use Suspect that untreated OSA and patient with obesity, debilitation, poor abdominal  compliance all contributing.  Would be optimal if she could tolerate CPAP but she has not been able in the past. No fever and no leukocytosis to support pneumonia. Briefly on Bipap yesterday, not needed long. - Responding well to aggressively pulmonary hygiene, secretion clearance, chest PT, I-S, flutter valve, guaifenesin , hypertonic nebs. She does not need bronchoscopy. -  Caution with sedating medications incl oxycodone  which will suppress respiratory drive and secretion clearance - Baseline is scheduled Brovana /Pulmicort .  For now will do DuoNeb to 4 times daily on a schedule, continue budesonide , pause brovana  for now - With repeat CXR showing improvement and otherwise stable respiratory status, will discuss for transfer from ICU  Chronic hemorrhage and presentation with hemorrhagic shock due to duodenal ulcer - POD 21 , s/p ex lap with graham patch repair and JP drain placement for Perforated duodenal ulcer, possible bleeding ulcer as well, Dr. Johanna at [facility] 6/23  Mechanical fall with left femoral fracture repaired on 6/20. Recurrent GI bleeding. Surgery continues to follow. GI has weighed in as well. Plan to continue high dose PPI and monitor Hg. She is too risky for scoping; repeat perforation would not have good surgical options - rec holding this until one month post laparotomy. - Recs per surgery - Stable Hg, continue to monitor - soft diet  Hyperlipidemia Urinary retention AKI resolved  Best Practice (right click and Reselect all SmartList Selections daily)   Diet/type: dysphagia diet DVT prophylaxis SCD Pressure ulcer(s): present on admission  GI prophylaxis: PPI Lines: Central line PICC RUE Foley:  N/A Code Status:  full code Last date of multidisciplinary goals of care discussion [pending]  Labs   CBC: Recent Labs  Lab 07/03/24 0400 07/03/24 2139 07/04/24 0351 07/05/24 0358 07/05/24 2055 07/06/24 0331  WBC 7.3  --  7.4 5.3 8.1 6.6  NEUTROABS  --   --  5.0 3.5  --  4.6  HGB 7.8* 7.6* 7.5* 7.1* 10.2* 10.0*  HCT 24.8* 23.9* 23.7* 22.4* 31.8* 31.1*  MCV 95.8  --  96.3 97.4 95.2 95.4  PLT 203  --  211 212 264 236    Basic Metabolic Panel: Recent Labs  Lab 07/02/24 0451 07/03/24 0400 07/04/24 0351 07/05/24 0358 07/06/24 0331  NA 134* 133* 133* 135 136  K 3.4* 3.6 3.2* 4.3 3.8  CL 100 98 99 104 97*  CO2 28 29 28 27 31    GLUCOSE 79 164* 76 77 77  BUN 19 14 12 12 9   CREATININE 0.98 0.83 0.76 0.82 0.87  CALCIUM  7.6* 7.7* 7.8* 8.1* 8.2*  MG  --   --  1.9 2.1 2.0  PHOS  --   --  2.6 2.2* 3.5   GFR: Estimated Creatinine Clearance: 72.1 mL/min (by C-G formula based on SCr of 0.87 mg/dL). Recent Labs  Lab 07/04/24 0351 07/05/24 0358 07/05/24 2055 07/06/24 0331  WBC 7.4 5.3 8.1 6.6    Liver Function Tests: Recent Labs  Lab 07/01/24 0329  AST 15  ALT 11  ALKPHOS 47  BILITOT 0.4  PROT 3.9*  ALBUMIN  <1.5*   No results for input(s): LIPASE, AMYLASE in the last 168 hours. No results for input(s): AMMONIA in the last 168 hours.  ABG    Component Value Date/Time   PHART 7.44 07/05/2024 1445   PCO2ART 47 07/05/2024 1445   PO2ART 68 (L) 07/05/2024 1445   HCO3 31.9 (H) 07/05/2024 1445   TCO2 23 06/15/2024 2127   ACIDBASEDEF 3.0 (H) 06/15/2024 2127   O2SAT 98.3 07/05/2024  1445     Coagulation Profile: Recent Labs  Lab 06/29/24 1538  INR 1.4*    Cardiac Enzymes: No results for input(s): CKTOTAL, CKMB, CKMBINDEX, TROPONINI in the last 168 hours.  HbA1C: No results found for: HGBA1C  CBG: Recent Labs  Lab 07/05/24 1923 07/05/24 2328 07/06/24 0322 07/06/24 0731 07/06/24 1103  GLUCAP 84 91 71 74 87    Review of Systems:   Complains of pain - reports a low pain tolerance - has pain in L chest wall and cramping in the legs.  Past Medical History:  She,  has a past medical history of Asthma, Atypical endometrial hyperplasia, COPD (chronic obstructive pulmonary disease) (HCC), Goiter, Gout, Heart murmur, Hypertension, Hypothyroidism, Vitamin B12 deficiency, and Yeast infection.   Surgical History:   Past Surgical History:  Procedure Laterality Date   ABDOMINAL WALL DEFECT REPAIR N/A 04/18/2016   Procedure: CLOSURE OF ABDOMEN;  Surgeon: Debby Shipper, MD;  Location: [facility] OR;  Service: General;  Laterality: N/A;   APPENDECTOMY     APPLICATION OF WOUND VAC N/A  04/02/2016   Procedure: application of wound vac+;  Surgeon: Camellia Blush, MD;  Location: [facility] OR;  Service: General;  Laterality: N/A;   CHOLECYSTECTOMY  2009   gallstone removed   COLOSTOMY N/A 03/30/2016   Procedure: COLOSTOMY;  Surgeon: Donnice Bury, MD;  Location: [facility] OR;  Service: General;  Laterality: N/A;   COLOSTOMY REVISION N/A 03/28/2016   Procedure: COLOSTOMY REVISION;  Surgeon: Lynwood Pina, MD;  Location: [facility] OR;  Service: General;  Laterality: N/A;   COLOSTOMY REVISION N/A 03/30/2016   Procedure: COLON RESECTION LEFT;  Surgeon: Donnice Bury, MD;  Location: [facility] OR;  Service: General;  Laterality: N/A;   FEMUR IM NAIL Left 06/12/2024   Procedure: INSERTION, INTRAMEDULLARY ROD, FEMUR, RETROGRADE;  Surgeon: Onesimo Oneil LABOR, MD;  Location: [facility] ORS;  Service: Orthopedics;  Laterality: Left;   IR ANGIOGRAM VISCERAL SELECTIVE  06/25/2024   IR ANGIOGRAM VISCERAL SELECTIVE  06/25/2024   IR ANGIOGRAM VISCERAL SELECTIVE  06/25/2024   IR EMBO ART  VEN HEMORR LYMPH EXTRAV  INC GUIDE ROADMAPPING  06/25/2024   IR US  GUIDE VASC ACCESS RIGHT  06/25/2024   LAPAROTOMY N/A 03/28/2016   Procedure: EXPLORATORY LAPAROTOMY;  Surgeon: Lynwood Pina, MD;  Location: [facility] OR;  Service: General;  Laterality: N/A;   LAPAROTOMY N/A 04/02/2016   Procedure: Re-exploration of open abdomen, application of abdominal wound vac;  Surgeon: Camellia Blush, MD;  Location: [facility] OR;  Service: General;  Laterality: N/A;   LAPAROTOMY N/A 04/04/2016   Procedure: EXPLORATORY LAPAROTOMY, PLACEMENT OF ABRA ABDOMINAL WALL CLOSURE SET ;  Surgeon: Camellia Blush, MD;  Location: [facility] OR;  Service: General;  Laterality: N/A;   LAPAROTOMY N/A 04/18/2016   Procedure: EXPLORATORY LAPAROTOMY;  Surgeon: Debby Shipper, MD;  Location: [facility] OR;  Service: General;  Laterality: N/A;   LAPAROTOMY N/A 06/15/2024   Procedure: LAPAROTOMY, EXPLORATORY OMENTAL GRAHAM PATCH REPAIR.;  Surgeon: Evonnie Dorothyann LABOR, DO;  Location: [facility] ORS;  Service: General;  Laterality: N/A;    LYSIS OF ADHESION N/A 06/15/2024   Procedure: LAPAROTOMY, FOR LYSIS OF ADHESIONS;  Surgeon: Evonnie Dorothyann LABOR, DO;  Location: [facility];  Service: General;  Laterality: N/A;   NECK SURGERY  1994   repair disk   OMENTECTOMY N/A 03/28/2016   Procedure: OMENTECTOMY;  Surgeon: Lynwood Pina, MD;  Location: [facility] OR;  Service: General;  Laterality: N/A;   ROBOTIC ASSISTED TOTAL HYSTERECTOMY WITH BILATERAL SALPINGO OOPHERECTOMY  11/18/2012   Procedure: ROBOTIC  ASSISTED TOTAL HYSTERECTOMY WITH BILATERAL SALPINGO OOPHORECTOMY;  Surgeon: Elenore A. Dodie, MD;  Location: [facility] ORS;  Service: Gynecology;  Laterality: N/A;   TONSILLECTOMY     as a child   VACUUM ASSISTED CLOSURE CHANGE N/A 03/30/2016   Procedure: ABDOMINAL VAC CHANGE;  Surgeon: Donnice Bury, MD;  Location: [facility] OR;  Service: General;  Laterality: N/A;   WOUND DEBRIDEMENT N/A 03/28/2016   Procedure: DEBRIDEMENT WOUND;  Surgeon: Lynwood Pina, MD;  Location: [facility] OR;  Service: General;  Laterality: N/A;    Social History:   reports that she has been smoking cigarettes. She has a 42 pack-year smoking history. She has never used smokeless tobacco. She reports current alcohol use of about 1.0 - 3.0 standard drink of alcohol per week. She reports that she does not use drugs.   Family History:  Her family history includes COPD in her mother; Congestive Heart Failure in her mother; Diabetes in her father and mother; Heart attack in her father. There is no history of Colon cancer or Colon polyps.   Allergies Allergies  Allergen Reactions   Penicillins Anaphylaxis, Hives, Shortness Of Breath and Swelling    Pt given ceftriaxone  05/27/24 in ED and tolerated   Biphosphate Other (See Comments)    Aching joints   Fluticasone  Other (See Comments)    Headaches    Home Medications  Prior to Admission medications   Medication Sig Start Date End Date Taking? Authorizing Provider  acetaminophen  (TYLENOL ) 500 MG tablet Take 1,000 mg by mouth every 6 (six) hours.    Yes [provider]  albuterol  (VENTOLIN  HFA) 108 (90 Base) MCG/ACT inhaler Inhale 2 puffs into the lungs every 6 (six) hours as needed. Wheezing and shortness of breath 05/31/24 07/30/24 Yes Shahmehdi, Seyed A, MD  allopurinol  (ZYLOPRIM ) 300 MG tablet Take 300 mg by mouth daily.   Yes [provider]  Cyanocobalamin (VITAMIN B-12 CR PO) Take 1 tablet by mouth daily.   Yes [provider]  Ergocalciferol  50 MCG (2000 UT) TABS Take 2,000 Units by mouth daily.   Yes [provider]  fluticasone -salmeterol (ADVAIR) 500-50 MCG/ACT AEPB Inhale 1 puff into the lungs in the morning and at bedtime. 05/31/24 06/30/24 Yes Shahmehdi, Adriana LABOR, MD  gabapentin  (NEURONTIN ) 300 MG capsule Take 1-2 capsules by mouth at bedtime. 02/27/21  Yes [provider]  levothyroxine  (SYNTHROID ) 137 MCG tablet Take 137 mcg by mouth daily.   Yes [provider]  nystatin  (MYCOSTATIN /NYSTOP ) powder Apply topically 3 (three) times daily. 05/31/24  Yes Shahmehdi, Seyed A, MD  rosuvastatin  (CRESTOR ) 10 MG tablet Take 10 mg by mouth daily.   Yes [provider]    Critical care time: 35 mins    Lonni Africa, DO IM Resident PGY-2

## 2024-07-06 NOTE — Consult Note (Signed)
 WOC Nurse wound follow up Wound type:  Stage 3 Pressure Injury sacrum Skin tear lower back-intertriginous skin breakdown Measurement: 5.5cm x 3.5cm x 3.0cm   Wound bed: clean, pink, moist; a bit slick in appearance  Drainage (amount, consistency, odor) serosanguinous  Periwound: intact, mild maceration Dressing procedure/placement/frequency Contine POC; cleanse with saline, pack with silver hydrofiber, top with foam. Change daily LALM in place for moisture management and pressure redistribution ADD RD for wound supplementation  Cover skin tear on lower back with single layer of xerform gauze and foam. Change daily    WOC Nurse ostomy consult note Stoma type/location: RLQ, colostomy from 2017; Dr.Wyatt Stomal assessment/size: oval shaped, visualized through pouch Peristomal assessment: NA Treatment options for stomal/peristomal skin: NA Output liquid brown Ostomy pouching:  1pc. Convex [facility] # [id number]) and barrier ring ([facility] # [phone])   Education provided: NA Patient has had stoma since 2012  Will provide ostomy care orders for patient's nursing staff, to change per home routine 1-2x per week.   WOC Nurse team will follow with you and see patient within 10 days for wound assessments.  Please notify WOC nurses of any acute changes in the wounds or any new areas of concern Zareena Willis [facility] MSN, RN,CWOCN, CNS, CWON-AP [phone]

## 2024-07-06 NOTE — Plan of Care (Signed)

## 2024-07-06 NOTE — Progress Notes (Signed)
 Abdominal staples removed per previous order.

## 2024-07-06 NOTE — TOC Progression Note (Signed)
 Transition of Care [facility]) - Progression Note    Patient Details  Name: Christina Castaneda MRN: [id number] Date of Birth: 09-Oct-1956  [facility] of [facility]) CM/SW Contact  Lauraine FORBES Saa, LCSW Phone Number: 07/06/2024, 12:03 PM  Clinical Narrative:     12:03 PM Per chart review, patient is expected to discharge to [facility] in approximately two weeks. CSW to submit SNF insurance authorization upon patient's medical readiness for discharge to SNF.  Expected Discharge Plan: Skilled Nursing Facility Barriers to Discharge: Continued Medical Work up, English as a second language teacher  Expected Discharge Plan and Services In-house Referral: Clinical Social Work Discharge Planning Services: [employer] Consult Post Acute Care Choice: Skilled Nursing Facility Living arrangements for the past 2 months: Single Family Home                                       Social Determinants of Health (SDOH) Interventions SDOH Screenings   Food Insecurity: No Food Insecurity (06/08/2024)  Housing: Low Risk  (06/08/2024)  Transportation Needs: No Transportation Needs (06/08/2024)  Utilities: Not At Risk (06/08/2024)  Social Connections: Unknown (06/08/2024)  Recent Concern: Social Connections - Moderately Isolated (05/27/2024)  Tobacco Use: High Risk (06/25/2024)    Readmission Risk Interventions    05/27/2024   10:01 PM  Readmission Risk Prevention Plan  Post Dischage Appt Complete  Medication Screening Complete  Transportation Screening Complete

## 2024-07-06 NOTE — Progress Notes (Addendum)
 Nutrition Follow-up  DOCUMENTATION CODES:  Obesity unspecified  INTERVENTION:  Continue current diet as ordered Encourage PO intake Discontinue Prosource Plus Increase Ensure Plus High Protein to TID, provides 350kcal and 20g protein per serving MVI with minerals daily  NUTRITION DIAGNOSIS:  Increased nutrient needs related to acute illness, wound healing as evidenced by estimated needs. - progressing  GOAL:  Patient will meet greater than or equal to 90% of their needs - progressing  MONITOR:  Diet advancement, Labs, Weight trends, I & O's, Skin  REASON FOR ASSESSMENT:  Rounds    ASSESSMENT:  Pt admitted after a recent fall leading to L distal femur fracture s/p L femoral nailing. PMH significant for perforated sigmoid colon s/p Hartmann procedure, s/p colostomy revision, HTN.  6/20: s/p L femoral nail 6/23: perforated bleeding duodenal ulcer; s/p exlap with graham patch repair, 1 prbc transfused 6/26: NGT removed, UGI: no leak; txr out of ICU, TPN initiated, clear liquid diet initiated 6/30: TPN discontinued 7/02: black/tarry ostomy output, Hgb 6.1; 2 PRBC transfused; CTA: no active bleeding, deferred embolization; resumed clear liquid diet 7/03: more melena, IR reconsulted and embolization scheduled for today; 1 unit PRBC transfused  7/09 - repeat CT GI bleed: negative; Hgb 7.0 - 1 unit PRBC transfused  Pt resting in bed at the time of assessment. Awake and alert today. States that she can tell her appetite is trying to come back. Does not like the prosource but does like ensure. Will discontinue prosource but increase ensure.  Pt discussed during ICU rounds and with RN and MD. Pt stable to be transferred out, but no bed assignment yet.   Admit weight: 122.8 kg   Current weight: 104.2 kg   Intake/Output Summary (Last 24 hours) at 07/06/2024 9081 Last data filed at 07/06/2024 9146 Gross per 24 hour  Intake 1880.86 ml  Output 2700 ml  Net -819.14 ml  Net IO Since  Admission: -12,424.06 mL [07/06/24 0918]  Drains/Lines: PICC Double Lumen UOP: x 24 hours RUQ colostomy: no output documented  Midline abdominal JP drain: 0 ml x24 hours  Average Meal Intake: 7/1-7/14: 60% intake x 3 recorded meals  Nutritionally Relevant Medications: Scheduled Meds:  PROSource Plus  30 mL Oral BID BM   diphenhydrAMINE   25 mg Intravenous Once   Ensure Plus High Protein  237 mL Oral BID BM   levothyroxine   100 mcg Intravenous Daily   multivitamin with minerals  1 tablet Oral Daily   pantoprazole  IV  40 mg Intravenous Q12H   sucralfate   1 g Oral TID WC & HS   Continuous Infusions:  sodium chloride      PRN Meds: ondansetron , polyethylene glycol, prochlorperazine   Labs Reviewed: Chloride 97 CBG ranges from 71-114 mg/dL over the last 24 hours  NUTRITION - FOCUSED PHYSICAL EXAM: Flowsheet Row Most Recent Value  Orbital Region No depletion  Upper Arm Region No depletion  Thoracic and Lumbar Region No depletion  Buccal Region No depletion  Temple Region No depletion  Clavicle Bone Region No depletion  Clavicle and Acromion Bone Region No depletion  Scapular Bone Region No depletion  Dorsal Hand Unable to assess  [severe edema]  Patellar Region Unable to assess  [moderate pitting edema]  Anterior Thigh Region Unable to assess  Posterior Calf Region Unable to assess  Edema (RD Assessment) Severe  [BUE]  Hair Reviewed  Eyes Reviewed  Mouth Other (Comment)  [poor dentition]  Skin Reviewed  Nails Reviewed    Diet Order:   Diet Order  DIET DYS 3 Room service appropriate? Yes; Fluid consistency: Thin  Diet effective now                   EDUCATION NEEDS:  Not appropriate for education at this time  Skin:  Skin Assessment: Reviewed RN Assessment Skin Integrity Issues:: Stage III, Stage II, Other (Comment) Stage II: sacrum Stage III: mid coccyx Other: IAD L buttock, L groin, abdomen, bilateral sacrum, vagina  Last BM:  7/14 -  type 7  Height:  Ht Readings from Last 1 Encounters:  06/15/24 5' 5 (1.651 m)    Weight:  Wt Readings from Last 1 Encounters:  07/06/24 104.2 kg    Ideal Body Weight:  56.8 kg  BMI:  Body mass index is 38.23 kg/m.  Estimated Nutritional Needs:  Kcal:  1600-1800 Protein:  85-100g Fluid:  >/=1.6L    Vernell Lukes, RD, LDN, CNSC Registered Dietitian II Please reach out via secure chat

## 2024-07-07 DIAGNOSIS — K276 Chronic or unspecified peptic ulcer, site unspecified, with both hemorrhage and perforation: Secondary | ICD-10-CM | POA: Diagnosis not present

## 2024-07-07 LAB — CBC WITH DIFFERENTIAL/PLATELET
Abs Immature Granulocytes: 0.16 K/uL — ABNORMAL HIGH (ref 0.00–0.07)
Basophils Absolute: 0.1 K/uL (ref 0.0–0.1)
Basophils Relative: 1 %
Eosinophils Absolute: 0.1 K/uL (ref 0.0–0.5)
Eosinophils Relative: 2 %
HCT: 32.9 % — ABNORMAL LOW (ref 36.0–46.0)
Hemoglobin: 10.3 g/dL — ABNORMAL LOW (ref 12.0–15.0)
Immature Granulocytes: 3 %
Lymphocytes Relative: 24 %
Lymphs Abs: 1.6 K/uL (ref 0.7–4.0)
MCH: 30.7 pg (ref 26.0–34.0)
MCHC: 31.3 g/dL (ref 30.0–36.0)
MCV: 98.2 fL (ref 80.0–100.0)
Monocytes Absolute: 0.4 K/uL (ref 0.1–1.0)
Monocytes Relative: 6 %
Neutro Abs: 4.2 K/uL (ref 1.7–7.7)
Neutrophils Relative %: 64 %
Platelets: 250 K/uL (ref 150–400)
RBC: 3.35 MIL/uL — ABNORMAL LOW (ref 3.87–5.11)
RDW: 16.2 % — ABNORMAL HIGH (ref 11.5–15.5)
WBC: 6.5 K/uL (ref 4.0–10.5)
nRBC: 0 % (ref 0.0–0.2)

## 2024-07-07 LAB — BASIC METABOLIC PANEL WITH GFR
Anion gap: 6 (ref 5–15)
BUN: 13 mg/dL (ref 8–23)
CO2: 31 mmol/L (ref 22–32)
Calcium: 8.1 mg/dL — ABNORMAL LOW (ref 8.9–10.3)
Chloride: 99 mmol/L (ref 98–111)
Creatinine, Ser: 0.79 mg/dL (ref 0.44–1.00)
GFR, Estimated: >60 mL/min (ref >60)
Glucose, Bld: 81 mg/dL (ref 70–99)
Potassium: 4 mmol/L (ref 3.5–5.1)
Sodium: 136 mmol/L (ref 135–145)

## 2024-07-07 LAB — GLUCOSE, CAPILLARY
Glucose-Capillary: 73 mg/dL (ref 70–99)
Glucose-Capillary: 98 mg/dL (ref 70–99)
Glucose-Capillary: 86 mg/dL (ref 70–99)
Glucose-Capillary: 75 mg/dL (ref 70–99)

## 2024-07-07 LAB — PHOSPHORUS: Phosphorus: 2.6 mg/dL (ref 2.5–4.6)

## 2024-07-07 LAB — MAGNESIUM: Magnesium: 1.9 mg/dL (ref 1.7–2.4)

## 2024-07-07 MED ORDER — IPRATROPIUM-ALBUTEROL 0.5-2.5 (3) MG/3ML IN SOLN
3.0000 mL | Freq: Two times a day (BID) | RESPIRATORY_TRACT | Status: DC
  Administered 2024-07-07 – 2024-07-11 (×7): 3 mL via RESPIRATORY_TRACT
  Filled 2024-07-07 (×8): qty 3

## 2024-07-07 NOTE — Progress Notes (Signed)
 Physical Therapy Wound Treatment Patient Details  Name: Christina Castaneda MRN: [id number] Date of Birth: May 26, 1956  Today's Date: 07/07/2024 Time: 9071-8968 Time Calculation (min): 63 min  Subjective  Subjective Assessment Subjective: Pt worried about GI bleeding Patient and Family Stated Goals: go home Date of Onset:  (unknown) Prior Treatments: cleansing, Dakins soaked gauze, covered by sacral foam  Pain Score:  8/10 premedicated   Wound Assessment  Wound 06/08/24 1104 Pressure Injury Coccyx Medial Stage 3 -  Full thickness tissue loss. Subcutaneous fat may be visible but bone, tendon or muscle are NOT exposed. (Active)  Wound Image   07/07/24 1700  Site / Wound Assessment Pink;Red;Yellow;Friable;Granulation tissue 07/07/24 1700  Peri-wound Assessment Erythema (blanchable);Maceration;Pink;Denuded 07/07/24 1700  Wound Length (cm) 6.2 cm 07/07/24 1700  Wound Width (cm) 5.1 cm 07/07/24 1700  Wound Surface Area (cm^2) 24.83 cm^2 07/07/24 1700  Wound Depth (cm) 2.5 cm 07/07/24 1700  Wound Volume (cm^3) 41.39 cm^3 07/07/24 1700  Drainage Description Serous 07/07/24 1700  Drainage Amount Moderate 07/07/24 1700  Treatment Cleansed;Irrigation;Debridement (Selective) 07/07/24 1700  Dressing Type ABD;Barrier Film (skin prep);Santyl 07/07/24 1700  Dressing Changed Changed 07/07/24 1700  Dressing Status Clean, Dry, Intact 07/07/24 1700  State of Healing Early/partial granulation 07/07/24 1700  % Wound base Red or Granulating 25% 07/07/24 1700  % Wound base Yellow/Fibrinous Exudate 75% 07/07/24 1700  % Wound base Black/Eschar 0% 07/07/24 1700  % Wound base Other/Granulation Tissue (Comment) 0% 07/07/24 1700  Tunneling (cm) 2.1 at 7:00 07/07/24 1700  Margins Unattached edges (unapproximated) 07/07/24 1700           Wound Assessment and Plan  Wound Therapy - Assess/Plan/Recommendations Wound Therapy - Clinical Statement: Pt bed saturated in urine on entry, while cleansing skin blister  noted directly adjacent to tip of Purewick, RN notified and came to see placing a small foam dressing to protect blister. Purewick was removed. PT advised against use of bed pan due to maceration of skin along back and buttocks. Pt wound bed is improving with granulated tissue however there is a tunnel that is evolving at 7 o'clock and from the appearence of the slough along the edge of the wound from 7-9 o'clock it may progress in that direction. PT recommending continued selective debridement to reduce bioburden and improve wound bed healing. Wound Therapy - Functional Problem List: decrease mobility Factors Delaying/Impairing Wound Healing: Immobility, Multiple medical problems, Polypharmacy, Vascular compromise, Incontinence Hydrotherapy Plan: Debridement, Dressing change, Patient/family education Wound Therapy - Frequency: 2X / week Wound Therapy - Follow Up Recommendations: dressing changes by RN  Wound Therapy Goals- Improve the function of patient's integumentary system by progressing the wound(s) through the phases of wound healing (inflammation - proliferation - remodeling) by: Wound Therapy Goals - Improve the function of patient's integumentary system by progressing the wound(s) through the phases of wound healing by: Decrease Necrotic Tissue to: 50 Decrease Necrotic Tissue - Progress: Progressing toward goal Increase Granulation Tissue to: 50 Increase Granulation Tissue - Progress: Progressing toward goal Improve Drainage Characteristics: Min Improve Drainage Characteristics - Progress: Progressing toward goal Goals/treatment plan/discharge plan were made with and agreed upon by patient/family: Yes Time For Goal Achievement: 7 days Wound Therapy - Potential for Goals: Fair  Goals will be updated until maximal potential achieved or discharge criteria met.  Discharge criteria: when goals achieved, discharge from hospital, MD decision/surgical intervention, no progress towards goals,  refusal/missing three consecutive treatments without notification or medical reason.  GP     Charges PT Wound  Care Charges $Wound Debridement up to 20 cm: < or equal to 20 cm $PT Hydrotherapy Dressing: 3 dressings     Khylen Riolo B. [name] PT, DPT Acute Rehabilitation Services Please use secure chat or  Call Office [phone]   Almarie KATHEE Fleeta [facility] 07/07/2024, 5:17 PM

## 2024-07-07 NOTE — Progress Notes (Signed)
 Progress Note  22 Days Post-Op  Subjective: Pt tolerating diet and having brown liquid stool output from colostomy. Staples were removed yesterday and drain output remains minimal. Her primary concern this AM was wanting a foley to protect perineal skin from getting urine on it since purewick was not working well for her overnight and this AM.   Objective: Vital signs in last 24 hours: Temp:  [97.4 F (36.3 C)-98.3 F (36.8 C)] 97.8 F (36.6 C) (07/15 0849) Pulse Rate:  [47-114] 62 (07/15 0849) Resp:  [16-22] 16 (07/15 0849) BP: (111-140)/(56-80) 140/77 (07/15 0849) SpO2:  [36 %-98 %] 98 % (07/15 0849) Last BM Date : 07/06/24 (colostomy bag in place)  Intake/Output from previous day: 07/14 0701 - 07/15 0700 In: 637 [P.O.:637] Out: 730 [Urine:700; Stool:30] Intake/Output this shift: Total I/O In: -  Out: 900 [Urine:900]  PE: General: WD, morbidly obese female who is laying in bed in NAD Heart: regular, rate, and rhythm. Lungs: Respiratory effort nonlabored Abd: soft, appropriately ttp, ostomy viable with small amount liquid brown stool present, drain serous, incision C/D/I s/p staple removal   Lab Results:  Recent Labs    07/06/24 0331 07/07/24 0235  WBC 6.6 6.5  HGB 10.0* 10.3*  HCT 31.1* 32.9*  PLT 236 250   BMET Recent Labs    07/06/24 0331 07/07/24 0235  NA 136 136  K 3.8 4.0  CL 97* 99  CO2 31 31  GLUCOSE 77 81  BUN 9 13  CREATININE 0.87 0.79  CALCIUM  8.2* 8.1*   PT/INR No results for input(s): LABPROT, INR in the last 72 hours.  CMP     Component Value Date/Time   NA 136 07/07/2024 0235   K 4.0 07/07/2024 0235   CL 99 07/07/2024 0235   CO2 31 07/07/2024 0235   GLUCOSE 81 07/07/2024 0235   BUN 13 07/07/2024 0235   CREATININE 0.79 07/07/2024 0235   CALCIUM  8.1 (L) 07/07/2024 0235   PROT 3.9 (L) 07/01/2024 0329   ALBUMIN  <1.5 (L) 07/01/2024 0329   AST 15 07/01/2024 0329   ALT 11 07/01/2024 0329   ALKPHOS 47 07/01/2024 0329    BILITOT 0.4 07/01/2024 0329   GFRNONAA >60 07/07/2024 0235   GFRAA >60 06/05/2016 1320   Lipase     Component Value Date/Time   LIPASE 64 (H) 03/28/2016 1705       Studies/Results: DG CHEST PORT 1 VIEW Result Date: 07/06/2024 CLINICAL DATA:  Shortness of breath EXAM: PORTABLE CHEST 1 VIEW COMPARISON:  July 05, 2024 FINDINGS: Improved aeration of the left lung with persistent opacity at left lung base, likely combination of consolidation and pleural effusion. Small right pleural effusion. Cardiomediastinal silhouette is obscured. IMPRESSION: Improved aeration of the left lung with persistent left lower lung opacity, likely combination of consolidation and pleural effusion. Small right pleural effusion. Electronically Signed   By: Michaeline Blanch M.D.   On: 07/06/2024 11:03     Anti-infectives: Anti-infectives (From admission, onward)    Start     Dose/Rate Route Frequency Ordered Stop   06/16/24 1800  vancomycin  (VANCOCIN ) IVPB 1000 mg/200 mL premix  Status:  Discontinued        1,000 mg 200 mL/hr over 60 Minutes Intravenous Every 24 hours 06/15/24 1317 06/15/24 1328   06/15/24 1700  vancomycin  (VANCOREADY) IVPB 2000 mg/400 mL  Status:  Discontinued        2,000 mg 200 mL/hr over 120 Minutes Intravenous  Once 06/15/24 1317 06/15/24 1328  06/15/24 1600  fluconazole  (DIFLUCAN ) IVPB 400 mg  Status:  Discontinued        400 mg 100 mL/hr over 120 Minutes Intravenous Every 24 hours 06/15/24 1317 06/16/24 0914   06/15/24 1500  metroNIDAZOLE  (FLAGYL ) IVPB 500 mg  Status:  Discontinued        500 mg 100 mL/hr over 60 Minutes Intravenous Every 12 hours 06/15/24 1216 06/15/24 1255   06/15/24 1400  ciprofloxacin  (CIPRO ) IVPB 400 mg  Status:  Discontinued        400 mg 200 mL/hr over 60 Minutes Intravenous Every 12 hours 06/15/24 1216 06/15/24 1255   06/15/24 1400  piperacillin -tazobactam (ZOSYN ) IVPB 3.375 g        3.375 g 12.5 mL/hr over 240 Minutes Intravenous Every 8 hours 06/15/24 1317  06/20/24 2359   06/15/24 1400  vancomycin  (VANCOCIN ) IVPB 1000 mg/200 mL premix  Status:  Discontinued        1,000 mg 200 mL/hr over 60 Minutes Intravenous Every 24 hours 06/15/24 1328 06/16/24 0914   06/15/24 1333  vancomycin  (VANCOCIN ) 1-5 GM/200ML-% IVPB       Note to Pharmacy: Romona Shaver E: cabinet override      06/15/24 1333 06/15/24 1410   06/15/24 1221  ciprofloxacin  (CIPRO ) 400 MG/200ML IVPB  Status:  Discontinued       Note to Pharmacy: Dannielle Railing S: cabinet override      06/15/24 1221 06/15/24 1343   06/15/24 1221  metroNIDAZOLE  (FLAGYL ) 500 MG/100ML IVPB  Status:  Discontinued       Note to Pharmacy: Dannielle Railing S: cabinet override      06/15/24 1221 06/15/24 1343   06/12/24 1223  clindamycin  (CLEOCIN ) 900 MG/50ML IVPB       Note to Pharmacy: Elaine Nest L: cabinet override      06/12/24 1223 06/12/24 1332   06/12/24 1131  ceFAZolin  (ANCEF ) 2-4 GM/100ML-% IVPB  Status:  Discontinued       Note to Pharmacy: Billy Sluder H: cabinet override      06/12/24 1131 06/12/24 1434   06/11/24 1300  clindamycin  (CLEOCIN ) IVPB 900 mg  Status:  Discontinued        900 mg 100 mL/hr over 30 Minutes Intravenous On call to O.R. 06/10/24 1041 06/10/24 1447   06/11/24 1300  ceFAZolin  (ANCEF ) IVPB 2g/100 mL premix        2 g 200 mL/hr over 30 Minutes Intravenous On call to O.R. 06/10/24 1447 06/12/24 1908        Assessment/Plan POD 22 , s/p ex lap with graham patch repair and JP drain placement for Perforated duodenal ulcer, possible bleeding ulcer as well, Dr. Johanna at [facility] 6/23 - IR embolized (7/4)  - Hgb stable - h. Pylori negative - ok to remove surgical drain today - soft diet - CT GI bleed protocol 7/7- negative for active bleeding but concern for thickening of distal stomach - repeat CT GI bleed 7/9 negative for active bleed - she is too soon post-op to consider EGD at this time due to risk of re-perforation, would like patient to be at least 4-6  weeks post-op - IR: repeat angio not recommended at this time - fixed rate vaso given for 24h - multi-modal pain control - PT, OOB as able pending femur fx recs - Coninue BID PPI and carafate  - pt is surgically stable for DC at this time, reached out to Dr. Evonnie to arrange surgical follow up with recommendation for GI also for EGD  around 4-6 weeks post-op - general surgery will sign off at this time but we remain available as needed if questions or concerns arise    FEN - Dys 3 diet VTE - on hold due to anemia/bleeding ID - [email or web address] , completed    LOS: 30 days   I reviewed hospitalist notes, last 24 h vitals and pain scores, last 48 h intake and output, last 24 h labs and trends, and last 24 h imaging results.  This care required moderate level of medical decision making.    Burnard JONELLE Louder, [facility] 07/07/2024, 11:40 AM Please see Amion for pager number during day hours 7:00am-4:30pm

## 2024-07-07 NOTE — Progress Notes (Signed)
 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  She had Christina Castaneda L distal femur fracture.  She underwent L femoral nail with orthopedics on 620.  Her course was complicated by hemorrhagic shock in the setting of Christina Castaneda perforated duodenal ulcer.  She underwent emergent ex lap with graham patch on 6/23.  She was transferred to [facility] for ICU care.  Her post op course was complicated by recurrent melena through ostomy with acute blood loss anemia.  Ultimately had GDA embolization by IR on 7/3.  GI has been following, but no plans for EGD given the significant risk of recurrent perforation.  TRH was   Significant Events 6/15 admit to [facility] after fall days earlier, L femur fx and AKI 6/20 OR w ortho 6/23 hgb drop, shock -- found to have perforated bleeding duodenal ulcer  6/24 extubated 6/25 off pressors, txf out of ICU  6/26 [facility] assumed care 7/2 melena in ostomy 7/3 GDA embolization 7/10 fixed dose vasopressin  infusion 7/11 brown stools in ostomy 7/13 PCCM reconsulted for complete left lung atelectasis 7/14 CXR with some resolution and she is stable so will return to floor 7/15 [facility] assumed care  Assessment & Plan:   Principal Problem:   Peptic ulcer perforation and hemorrhage (HCC) Active Problems:   AKI (acute kidney injury) (HCC)   Leukocytosis   Hypothyroid   COPD  GOLD 2/AB   OSA (obstructive sleep apnea)   Obesity   Tobacco abuse   Essential hypertension   ABLA (acute blood loss anemia)   Generalized weakness   Chronic pain syndrome   Morbid obesity due to excess calories (HCC)   S/P colostomy (HCC)   Chronic venous insufficiency   Cigarette smoker   Sepsis (HCC)   Femur fracture, left  (HCC)   Hemorrhagic shock (HCC)   Thrombocytopenia (HCC)   Duodenal ulcer   Intra-abdominal adhesions   Melena  Acute hypoxemic respiratory failure  Complete left lung atelectasis  Thought due to mucus plug Continue CPT, FV Hypertonic saline nebs  Elevate hob Caution with sedating meds CXR 7/14 with improved aeration of L lung with persistent LLL opacity (suspected combination of consolidation and effusion) Repeat CXR 7/16  Chronic hemorrhage and presentation with hemorrhagic shock due to duodenal ulcer  s/p ex lap with graham patch repair and JP drain placement for Perforated duodenal ulcer, possible bleeding ulcer as well, Dr. Johanna at [facility] 6/23  S/p GDA embolization 7/3 Hb stable today Appreciate general surgery recommendations - ok to remove surgical drain, soft diet, too soon for repeat EGD due to risk of reperforation (defer to at least 4-6 weeks post op).  Dr. Evonnie will follow up outpatient.  Follow up with GI for EGD 4-6 weeks post op. GI signed off 7/9 (see note)  Mechanical fall with left femoral fracture repaired on 6/20. 6/20 retrograde femoral nail WBAT, knee imobilizer in place when ambulating  Will need follow up soon - will ask if orthopedics can see her tmrw (was supposed to have incision check and x ray 14 days post op)  Dysphagia with risk for aspiration and poor secretion clearance SLP eval  OSA with poor CPAP compliance COPD with continued tobacco  use Suspect that untreated OSA and patient with obesity, debilitation, poor abdominal compliance all contributing to respiratory failure.  Would be optimal if she could tolerate CPAP but she has not been able in the past.  Continue pulmonary hygiene as noted before - Baseline is scheduled Brovana /Pulmicort .  For now will do DuoNeb to 4 times daily on Christina Castaneda schedule, continue budesonide . Brovana  currently on hold.   Decubitus Ulcer Stage III, getting wound care by PT Developed wound related to purwick, will  place foley catheter temporarily to help keep her dry and allow appropriate wound healing - goal is for this to be temporary - consider transition back to purwick pending progression of blister suspected related to purwick tip   Urinary retention Resolved, foley currently is with goal for wound healing  Hyperlipidemia crestor   AKI resolved  Gout Currently allopurinol  on hold  Hypothyroidism Synthroid  has been increased based on her elevated TSH  Hypotension Continue midodrine  Her arb is on hold     DVT prophylaxis: SCD Code Status: full Family Communication: none Disposition:   Status is: Inpatient Remains inpatient appropriate because: need for inpatient care   Consultants:  Surgery Ortho IR GI  PCCM   Procedures:  6/20 - retrograde femoral nail 6/23 ex lap, omental graham patch repair of the duodenal ulcer, lysis of adhesions 7/3 coil embolization of GDA for refractory upper GI bleed  Antimicrobials:  Anti-infectives (From admission, onward)    Start     Dose/Rate Route Frequency Ordered Stop   06/16/24 1800  vancomycin  (VANCOCIN ) IVPB 1000 mg/200 mL premix  Status:  Discontinued        1,000 mg 200 mL/hr over 60 Minutes Intravenous Every 24 hours 06/15/24 1317 06/15/24 1328   06/15/24 1700  vancomycin  (VANCOREADY) IVPB 2000 mg/400 mL  Status:  Discontinued        2,000 mg 200 mL/hr over 120 Minutes Intravenous  Once 06/15/24 1317 06/15/24 1328   06/15/24 1600  fluconazole  (DIFLUCAN ) IVPB 400 mg  Status:  Discontinued        400 mg 100 mL/hr over 120 Minutes Intravenous Every 24 hours 06/15/24 1317 06/16/24 0914   06/15/24 1500  metroNIDAZOLE  (FLAGYL ) IVPB 500 mg  Status:  Discontinued        500 mg 100 mL/hr over 60 Minutes Intravenous Every 12 hours 06/15/24 1216 06/15/24 1255   06/15/24 1400  ciprofloxacin  (CIPRO ) IVPB 400 mg  Status:  Discontinued        400 mg 200 mL/hr over 60 Minutes Intravenous Every 12 hours 06/15/24 1216 06/15/24 1255    06/15/24 1400  piperacillin -tazobactam (ZOSYN ) IVPB 3.375 g        3.375 g 12.5 mL/hr over 240 Minutes Intravenous Every 8 hours 06/15/24 1317 06/20/24 2359   06/15/24 1400  vancomycin  (VANCOCIN ) IVPB 1000 mg/200 mL premix  Status:  Discontinued        1,000 mg 200 mL/hr over 60 Minutes Intravenous Every 24 hours 06/15/24 1328 06/16/24 0914   06/15/24 1333  vancomycin  (VANCOCIN ) 1-5 GM/200ML-% IVPB       Note to Pharmacy: Romona Shaver E: cabinet override      06/15/24 1333 06/15/24 1410   06/15/24 1221  ciprofloxacin  (CIPRO ) 400 MG/200ML IVPB  Status:  Discontinued       Note to Pharmacy: Dannielle Railing S: cabinet override      06/15/24 1221 06/15/24 1343   06/15/24 1221  metroNIDAZOLE  (FLAGYL ) 500 MG/100ML IVPB  Status:  Discontinued  Note to Pharmacy: Dannielle Railing S: cabinet override      06/15/24 1221 06/15/24 1343   06/12/24 1223  clindamycin  (CLEOCIN ) 900 MG/50ML IVPB       Note to Pharmacy: Elaine Nest L: cabinet override      06/12/24 1223 06/12/24 1332   06/12/24 1131  ceFAZolin  (ANCEF ) 2-4 GM/100ML-% IVPB  Status:  Discontinued       Note to Pharmacy: Billy Sluder H: cabinet override      06/12/24 1131 06/12/24 1434   06/11/24 1300  clindamycin  (CLEOCIN ) IVPB 900 mg  Status:  Discontinued        900 mg 100 mL/hr over 30 Minutes Intravenous On call to O.R. 06/10/24 1041 06/10/24 1447   06/11/24 1300  ceFAZolin  (ANCEF ) IVPB 2g/100 mL premix        2 g 200 mL/hr over 30 Minutes Intravenous On call to O.R. 06/10/24 1447 06/12/24 1908       Subjective: Asking for foley  Objective: Vitals:   07/07/24 0553 07/07/24 0809 07/07/24 0811 07/07/24 0849  BP:    (!) 140/77  Pulse: 65   62  Resp:    16  Temp:    97.8 F (36.6 C)  TempSrc:      SpO2:  97% 97% 98%  Weight:      Height:        Intake/Output Summary (Last 24 hours) at 07/07/2024 1547 Last data filed at 07/07/2024 1100 Gross per 24 hour  Intake 120 ml  Output 900 ml  Net -780 ml   Filed  Weights   07/04/24 0500 07/05/24 0400 07/06/24 9787  Weight: 103.6 kg 104.2 kg 104.2 kg    Examination:  General exam: Appears calm and comfortable  Respiratory system: unlabored Cardiovascular system: RRR Gastrointestinal system: Abdomen is nondistended, soft and nontender.  Central nervous system: Alert and oriented. No focal neurological deficits. Extremities: no lee    Data Reviewed: I have personally reviewed following labs and imaging studies  CBC: Recent Labs  Lab 07/04/24 0351 07/05/24 0358 07/05/24 2055 07/06/24 0331 07/07/24 0235  WBC 7.4 5.3 8.1 6.6 6.5  NEUTROABS 5.0 3.5  --  4.6 4.2  HGB 7.5* 7.1* 10.2* 10.0* 10.3*  HCT 23.7* 22.4* 31.8* 31.1* 32.9*  MCV 96.3 97.4 95.2 95.4 98.2  PLT 211 212 264 236 250    Basic Metabolic Panel: Recent Labs  Lab 07/03/24 0400 07/04/24 0351 07/05/24 0358 07/06/24 0331 07/07/24 0235  NA 133* 133* 135 136 136  K 3.6 3.2* 4.3 3.8 4.0  CL 98 99 104 97* 99  CO2 29 28 27 31 31   GLUCOSE 164* 76 77 77 81  BUN 14 12 12 9 13   CREATININE 0.83 0.76 0.82 0.87 0.79  CALCIUM  7.7* 7.8* 8.1* 8.2* 8.1*  MG  --  1.9 2.1 2.0 1.9  PHOS  --  2.6 2.2* 3.5 2.6    GFR: Estimated Creatinine Clearance: 78.4 mL/min (by C-G formula based on SCr of 0.79 mg/dL).  Liver Function Tests: Recent Labs  Lab 07/01/24 0329  AST 15  ALT 11  ALKPHOS 47  BILITOT 0.4  PROT 3.9*  ALBUMIN  <1.5*    CBG: Recent Labs  Lab 07/06/24 1103 07/06/24 1703 07/06/24 2130 07/07/24 0612 07/07/24 1152  GLUCAP 87 89 89 73 98     No results found for this or any previous visit (from the past 240 hours).       Radiology Studies: DG CHEST PORT 1 VIEW Result Date: 07/06/2024  CLINICAL DATA:  Shortness of breath EXAM: PORTABLE CHEST 1 VIEW COMPARISON:  July 05, 2024 FINDINGS: Improved aeration of the left lung with persistent opacity at left lung base, likely combination of consolidation and pleural effusion. Small right pleural effusion.  Cardiomediastinal silhouette is obscured. IMPRESSION: Improved aeration of the left lung with persistent left lower lung opacity, likely combination of consolidation and pleural effusion. Small right pleural effusion. Electronically Signed   By: Michaeline Blanch M.D.   On: 07/06/2024 11:03        Scheduled Meds:  acetaminophen   650 mg Oral QID   budesonide  (PULMICORT ) nebulizer solution  0.5 mg Nebulization BID   Chlorhexidine  Gluconate Cloth  6 each Topical Daily   diphenhydrAMINE   25 mg Intravenous Once   feeding supplement  237 mL Oral TID BM   guaiFENesin   600 mg Oral BID   ipratropium-albuterol   3 mL Nebulization BID   levothyroxine   200 mcg Oral Q0600   midodrine   5 mg Oral BID WC   multivitamin with minerals  1 tablet Oral Daily   nystatin    Topical BID   pantoprazole   40 mg Oral BID   sodium chloride  flush  10-40 mL Intracatheter Q12H   sodium chloride  HYPERTONIC  4 mL Nebulization BID   sucralfate   1 g Oral TID WC & HS   Continuous Infusions:  sodium chloride        LOS: 30 days    Time spent: over 30 min     Meliton Monte, MD [facility]   To contact the attending provider between 7A-7P or the covering provider during after hours 7P-7A, please log into the web site [email or web address] and access using universal [place] password for that web site. If you do not have the password, please call the hospital operator.  07/07/2024, 3:47 PM

## 2024-07-08 ENCOUNTER — Inpatient Hospital Stay (HOSPITAL_COMMUNITY)

## 2024-07-08 DIAGNOSIS — K276 Chronic or unspecified peptic ulcer, site unspecified, with both hemorrhage and perforation: Secondary | ICD-10-CM | POA: Diagnosis not present

## 2024-07-08 LAB — CBC WITH DIFFERENTIAL/PLATELET
Abs Immature Granulocytes: 0.05 K/uL (ref 0.00–0.07)
Basophils Absolute: 0 K/uL (ref 0.0–0.1)
Basophils Relative: 1 %
Eosinophils Absolute: 0.2 K/uL (ref 0.0–0.5)
Eosinophils Relative: 3 %
HCT: 34 % — ABNORMAL LOW (ref 36.0–46.0)
Hemoglobin: 10.7 g/dL — ABNORMAL LOW (ref 12.0–15.0)
Immature Granulocytes: 1 %
Lymphocytes Relative: 26 %
Lymphs Abs: 1.4 K/uL (ref 0.7–4.0)
MCH: 30.6 pg (ref 26.0–34.0)
MCHC: 31.5 g/dL (ref 30.0–36.0)
MCV: 97.1 fL (ref 80.0–100.0)
Monocytes Absolute: 0.3 K/uL (ref 0.1–1.0)
Monocytes Relative: 6 %
Neutro Abs: 3.4 K/uL (ref 1.7–7.7)
Neutrophils Relative %: 63 %
Platelets: 249 K/uL (ref 150–400)
RBC: 3.5 MIL/uL — ABNORMAL LOW (ref 3.87–5.11)
RDW: 15.9 % — ABNORMAL HIGH (ref 11.5–15.5)
WBC: 5.3 K/uL (ref 4.0–10.5)
nRBC: 0 % (ref 0.0–0.2)

## 2024-07-08 LAB — BASIC METABOLIC PANEL WITH GFR
Anion gap: 8 (ref 5–15)
BUN: 11 mg/dL (ref 8–23)
CO2: 32 mmol/L (ref 22–32)
Calcium: 8.3 mg/dL — ABNORMAL LOW (ref 8.9–10.3)
Chloride: 95 mmol/L — ABNORMAL LOW (ref 98–111)
Creatinine, Ser: 0.8 mg/dL (ref 0.44–1.00)
GFR, Estimated: >60 mL/min (ref >60)
Glucose, Bld: 77 mg/dL (ref 70–99)
Potassium: 3.9 mmol/L (ref 3.5–5.1)
Sodium: 135 mmol/L (ref 135–145)

## 2024-07-08 LAB — GLUCOSE, CAPILLARY
Glucose-Capillary: 74 mg/dL (ref 70–99)
Glucose-Capillary: 75 mg/dL (ref 70–99)
Glucose-Capillary: 104 mg/dL — ABNORMAL HIGH (ref 70–99)
Glucose-Capillary: 106 mg/dL — ABNORMAL HIGH (ref 70–99)

## 2024-07-08 MED ORDER — MIDODRINE HCL 5 MG PO TABS
2.5000 mg | ORAL_TABLET | Freq: Two times a day (BID) | ORAL | Status: DC
  Administered 2024-07-08 – 2024-07-11 (×8): 2.5 mg via ORAL
  Filled 2024-07-08 (×8): qty 1

## 2024-07-08 NOTE — Progress Notes (Signed)
 [facility] PROGRESS NOTE    Progress Note  Christina Castaneda  FMW:[id number] DOB: 01/18/1956 DOA: 06/07/2024 PCP: [name], DO     Brief Narrative:   Christina Castaneda is an 68 y.o. female past medical history of perforated sigmoid colon status post hamartoma, essential hypertension was admitted to [facility] after a mechanical fall which led to a left distal femur fracture underwent nailing by orthopedic surgery on 06/12/2024.  Her course was complicated by hemorrhagic shock in the setting of a perforated duodenal ulcer underwent emergent exploratory laparotomy with a Arlyss patch on 06/12/2024 transferred to the [facility] ICU, postop she developed melanotic stool through her ostomy leading to acute blood loss anemia, ultimately IR was consulted and she status post GDA embolization on 06/26/2019 5 GI on board no plan for EGD on less risk of perforation.  Significant Events: 6/15 admit to [facility] after fall days earlier, L femur fx and AKI 6/20 OR w ortho 6/23 hgb drop, shock -- found to have perforated bleeding duodenal ulcer  6/24 extubated 6/25 off pressors, txf out of ICU  6/26 [facility] assumed care 7/2 melena in ostomy 7/3 GDA embolization 7/10 fixed dose vasopressin  infusion 7/11 brown stools in ostomy 7/13 PCCM reconsulted for complete left lung atelectasis 7/14 CXR with some resolution and she is stable so will return to floor 7/15 [facility] assumed care Significant studies: 6/15>> CT left knee: Acute comminuted fracture of distal left femoral shaft. 6/16>> renal ultrasound: No hydronephrosis 6/23>> CT angiogram abdomen: Perforated and bleeding peptic ulcer disease affecting proximal duodenum-appears to be hematoma in the gastric antrum/proximal duodenum. 6/26>> upper GI series with KUB: No evidence of contrast leak. 7/02>> CT angio GI bleed study: No active bleeding. 7/07>> CT angio GI bleed study: No active bleeding 7/09>> CT angio GI bleed; no active bleeding   Significant  microbiology data: 6/23>> blood culture: No growth 6/23>> peritoneal fluid: Abundant Candida albicans/moderate Candida glabrata.   Assessment/Plan:   Acute respiratory failure with hypoxia due to complete left lung atelectasis: Pulmonary was consulted due to mucous plugging. Continue CPT and forced vital capacity. She status post hypertonic saline nebs. Will need to be cautious with sedation. Chest x-ray is pending this morning. She has remained afebrile with no leukocytosis. Continue to hold anticoagulation due to high risk of bleeding. Try to encourage incentive spirometry is much as possible patient does not appear to be motivated.  Peptic ulcer perforation and hemorrhage (HCC) status post repair with a Arlyss patch and repair on 06/15/2024/acute hemorrhagic shock due to duodenal ulcer, with a JP drain placed: Status post GDA embolization on 06/25/2024. General surgery remove drain now on a soft diet. GI on board for EGD in 4 to 6 weeks postdischarge. Continue dysphagia 3 diet. Cont. PPI and Carafate   Mechanical fall with a left femoral fracture: Status post repair on 06/12/2024 femoral nailing. Weightbearing as tolerated knee immobilizer in place. Evaluated by PT OT, will need skilled nursing facility placement.  Dysphagia: SLP  Obstructive sleep apnea: Suspect untreated obstructive sleep apnea, the patient is obese and debilitated with poor abdominal compliance contributing to respiratory failure. It would be optimal she would tolerate CPAP which she has not been able to. Continue pulmonary hygiene. At baseline she is on scheduled Brovana  and Pulmicort . Continue incentive spirometry and flutter valve.  Sacral decubitus ulcer stage III: Develop wound related to PureWick Foley in place temporarily. Can discontinue Foley upon discharge.  Urinary retention: Now resolved Foley in place for wound healing.  Hyperlipidemia:  Continue statins.  Acute kidney injury: Likely due  to hemorrhagic shock now resolved.  Gout: Continue to hold allopurinol .  Hypothyroidism Continue Synthroid .  Hypotension/essential hypertension: All antihypertensive medications were held she was started on low-dose midodrine , will continue to titrate down      DVT prophylaxis: SCD's Family Communication:none Status is: Inpatient Remains inpatient appropriate because: Left hip fracture and acute perforated ulcer    Code Status:     Code Status Orders  (From admission, onward)           Start     Ordered   06/15/24 1938  Full code  Continuous       Question:  By:  Answer:  Consent: discussion documented in EHR   06/15/24 1937           Code Status History     Date Active Date Inactive Code Status Order ID Comments User Context   06/07/2024 2035 06/15/2024 1937 Full Code [id number]  [name], MD ED   05/27/2024 1359 05/31/2024 2032 Full Code [id number]  [name] LABOR, MD ED   05/02/2016 1941 05/23/2016 1436 Full Code [id number]  [name], PA-C Inpatient   05/02/2016 1941 05/02/2016 1941 Full Code [id number]  [name] DEVONNA Inpatient   04/08/2016 0803 05/02/2016 1941 Full Code [id number]  [name], MD Inpatient   11/18/2012 1719 11/19/2012 1509 Full Code [id number]  Means, [name], RN Inpatient         IV Access:   Peripheral IV   Procedures and diagnostic studies:   DG CHEST PORT 1 VIEW Result Date: 07/06/2024 CLINICAL DATA:  Shortness of breath EXAM: PORTABLE CHEST 1 VIEW COMPARISON:  July 05, 2024 FINDINGS: Improved aeration of the left lung with persistent opacity at left lung base, likely combination of consolidation and pleural effusion. Small right pleural effusion. Cardiomediastinal silhouette is obscured. IMPRESSION: Improved aeration of the left lung with persistent left lower lung opacity, likely combination of consolidation and pleural effusion. Small right pleural effusion. Electronically Signed   By: Michaeline Blanch M.D.   On:  07/06/2024 11:03     Medical Consultants:   None.   Subjective:    Christina Castaneda she relates she has no abdominal pain no further melanotic stools.  Objective:    Vitals:   07/07/24 1940 07/07/24 2001 07/07/24 2003 07/08/24 0543  BP: (!) 149/86   (!) 148/83  Pulse: (!) 57   84  Resp: 16   16  Temp: (!) 97.5 F (36.4 C)   98.6 F (37 C)  TempSrc: Oral     SpO2: 98% 99% 98% 97%  Weight:      Height:       SpO2: 97 % O2 Flow Rate (L/min): 4 L/min FiO2 (%): 36 %   Intake/Output Summary (Last 24 hours) at 07/08/2024 0658 Last data filed at 07/07/2024 1601 Gross per 24 hour  Intake 120 ml  Output 1200 ml  Net -1080 ml   Filed Weights   07/04/24 0500 07/05/24 0400 07/06/24 0212  Weight: 103.6 kg 104.2 kg 104.2 kg    Exam: General exam: In no acute distress, morbidly obese. Respiratory system: Good air movement and clear to auscultation. Cardiovascular system: S1 & S2 heard, RRR. No JVD. Gastrointestinal system: Abdomen is nondistended, soft and nontender.  Extremities: No pedal edema. Skin: No rashes, lesions or ulcers Psychiatry: Judgement and insight appear normal. Mood & affect appropriate.    Data Reviewed:    Labs: Basic Metabolic Panel:  Recent Labs  Lab 07/04/24 0351 07/05/24 0358 07/06/24 0331 07/07/24 0235 07/08/24 0520  NA 133* 135 136 136 135  K 3.2* 4.3 3.8 4.0 3.9  CL 99 104 97* 99 95*  CO2 28 27 31 31  32  GLUCOSE 76 77 77 81 77  BUN 12 12 9 13 11   CREATININE 0.76 0.82 0.87 0.79 0.80  CALCIUM  7.8* 8.1* 8.2* 8.1* 8.3*  MG 1.9 2.1 2.0 1.9  --   PHOS 2.6 2.2* 3.5 2.6  --    GFR Estimated Creatinine Clearance: 78.4 mL/min (by C-G formula based on SCr of 0.8 mg/dL). Liver Function Tests: No results for input(s): AST, ALT, ALKPHOS, BILITOT, PROT, ALBUMIN  in the last 168 hours. No results for input(s): LIPASE, AMYLASE in the last 168 hours. No results for input(s): AMMONIA in the last 168 hours. Coagulation  profile No results for input(s): INR, PROTIME in the last 168 hours. COVID-19 Labs  No results for input(s): DDIMER, FERRITIN, LDH, CRP in the last 72 hours.  Lab Results  Component Value Date   SARSCOV2NAA NEGATIVE 05/27/2024    CBC: Recent Labs  Lab 07/04/24 0351 07/05/24 0358 07/05/24 2055 07/06/24 0331 07/07/24 0235 07/08/24 0520  WBC 7.4 5.3 8.1 6.6 6.5 5.3  NEUTROABS 5.0 3.5  --  4.6 4.2 3.4  HGB 7.5* 7.1* 10.2* 10.0* 10.3* 10.7*  HCT 23.7* 22.4* 31.8* 31.1* 32.9* 34.0*  MCV 96.3 97.4 95.2 95.4 98.2 97.1  PLT 211 212 264 236 250 249   Cardiac Enzymes: No results for input(s): CKTOTAL, CKMB, CKMBINDEX, TROPONINI in the last 168 hours. BNP (last 3 results) No results for input(s): PROBNP in the last 8760 hours. CBG: Recent Labs  Lab 07/07/24 0612 07/07/24 1152 07/07/24 1702 07/07/24 2155 07/08/24 0525  GLUCAP 73 98 86 75 74   D-Dimer: No results for input(s): DDIMER in the last 72 hours. Hgb A1c: No results for input(s): HGBA1C in the last 72 hours. Lipid Profile: No results for input(s): CHOL, HDL, LDLCALC, TRIG, CHOLHDL, LDLDIRECT in the last 72 hours. Thyroid  function studies: No results for input(s): TSH, T4TOTAL, T3FREE, THYROIDAB in the last 72 hours.  Invalid input(s): FREET3 Anemia work up: No results for input(s): VITAMINB12, FOLATE, FERRITIN, TIBC, IRON, RETICCTPCT in the last 72 hours. Sepsis Labs: Recent Labs  Lab 07/05/24 2055 07/06/24 0331 07/07/24 0235 07/08/24 0520  WBC 8.1 6.6 6.5 5.3   Microbiology No results found for this or any previous visit (from the past 240 hours).   Medications:    acetaminophen   650 mg Oral QID   budesonide  (PULMICORT ) nebulizer solution  0.5 mg Nebulization BID   Chlorhexidine  Gluconate Cloth  6 each Topical Daily   diphenhydrAMINE   25 mg Intravenous Once   feeding supplement  237 mL Oral TID BM   guaiFENesin   600 mg Oral BID    ipratropium-albuterol   3 mL Nebulization BID   levothyroxine   200 mcg Oral Q0600   midodrine   5 mg Oral BID WC   multivitamin with minerals  1 tablet Oral Daily   nystatin    Topical BID   pantoprazole   40 mg Oral BID   sodium chloride  flush  10-40 mL Intracatheter Q12H   sodium chloride  HYPERTONIC  4 mL Nebulization BID   sucralfate   1 g Oral TID WC & HS   Continuous Infusions:  sodium chloride         LOS: 31 days   Christina Castaneda  [facility]  07/08/2024, 6:58 AM

## 2024-07-08 NOTE — Progress Notes (Addendum)
 Physical Therapy Treatment Patient Details Name: Christina Castaneda MRN: [id number] DOB: 1956-07-08 Today's Date: 07/08/2024   History of Present Illness Pt is a 68 y/o F presenting to [facility] ED on 6/15 after fall, found to have L femoral neck fx and AKI. S/p retrograde IMN on 6/20. Hgb drop on 6/23 with dark ostomy output, transfused, subsequent hypotension requiring pressors. Found to have perforated bleeding duodenal ulcer s/p ex lap on 6/23. Transferred to [facility] for PCCM. 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    PT Comments  Pt resting in bed on arrival, agreeable to session with encouragement and giving good effort for functional mobility. Pt seen in conjunction with OT to maximize pt participation and activity tolerance and functional mobility progression. Pt able to roll to L with good UE use on rails and elevate trunk to sitting with mod A +2. Pt needing increased time and hands on assist to lower LLE to stool with pillow placed on top as pt with short stature and unable to reach floor without step stool. Pt able to maintain sitting balance with min A fading to CGA with L lateral lean due to poor trunk control and weakness. Pt able to perform propped sitting with return to midline on R to encourage trunk activation and midline sitting. Pt motivated to ultiametly return home and will benefit from continued inpatient follow up therapy, <3 hours/day to address deficits and maximize functional independence and decrease caregiver burden. Pt continues to benefit from skilled PT services to progress toward functional mobility goals.      If plan is discharge home, recommend the following: Two people to help with walking and/or transfers;Two people to help with bathing/dressing/bathroom   Can travel by private vehicle     No  Equipment Recommendations  None recommended by PT    Recommendations for Other Services       Precautions / Restrictions  Precautions Precautions: Fall;Other (comment) Recall of Precautions/Restrictions: Intact Precaution/Restrictions Comments: JP drain, colostomy, sacral wound Required Braces or Orthoses: Knee Immobilizer - Left Knee Immobilizer - Left: On when out of bed or walking Restrictions Weight Bearing Restrictions Per Provider Order: Yes LLE Weight Bearing Per Provider Order: Weight bearing as tolerated Other Position/Activity Restrictions: No L knee ROM restrictions     Mobility  Bed Mobility Overal bed mobility: Needs Assistance Bed Mobility: Rolling, Sidelying to Sit, Sit to Supine Rolling: +2 for physical assistance, Used rails, Mod assist Sidelying to sit: Mod assist, +2 for physical assistance, HOB elevated, Used rails   Sit to supine: Max assist, +2 for physical assistance   General bed mobility comments: pt able to reach with RUE across to roll to L with mod A +2 to comeplete and mod A +2 needed to elavate trunk into sitting, pt needing max A +2 to maange LEs into bed, able to self lower trunk    Transfers Overall transfer level: Needs assistance                 General transfer comment: nt this session due to pain and nausea    Ambulation/Gait                   Stairs             Wheelchair Mobility     Tilt Bed    Modified Rankin (Stroke Patients Only)       Balance Overall balance assessment: Needs assistance Sitting-balance support: Feet supported, Bilateral  upper extremity supported Sitting balance-Leahy Scale: Poor Sitting balance - Comments: able to maintain sitting balanec with minA - CGA for safety cues for midline posture as pt tiwh tendency for L lateral lean propping on elbow                                    Communication Communication Communication: No apparent difficulties  Cognition Arousal: Alert Behavior During Therapy: WFL for tasks assessed/performed   PT - Cognitive impairments: No apparent impairments                        PT - Cognition Comments: Pt very anxious with mobility, however receptive to encouragement and reassurance Following commands: Intact      Cueing Cueing Techniques: Verbal cues  Exercises General Exercises - Lower Extremity Hip ABduction/ADduction: AAROM, Supine, Left, 5 reps Other Exercises Other Exercises: propped sitting on R elbow to encourage midline sitting and trunk control as pt with tendency for L lateral lean onto L elbow    General Comments General comments (skin integrity, edema, etc.): VSS on RA      Pertinent Vitals/Pain Pain Assessment Pain Assessment: Faces Faces Pain Scale: Hurts little more Pain Location: LLE with movement Pain Descriptors / Indicators: Guarding, Grimacing, Sore Pain Intervention(s): Limited activity within patient's tolerance, Monitored during session, Patient requesting pain meds-RN notified    Home Living                          Prior Function            PT Goals (current goals can now be found in the care plan section) Acute Rehab PT Goals Patient Stated Goal: to go home PT Goal Formulation: With patient Time For Goal Achievement: 07/13/24 Progress towards PT goals: Progressing toward goals    Frequency    Min 1X/week      PT Plan      Co-evaluation PT/OT/SLP Co-Evaluation/Treatment: Yes Reason for Co-Treatment: For patient/therapist safety;To address functional/ADL transfers PT goals addressed during session: Mobility/safety with mobility;Balance        AM-PAC PT 6 Clicks Mobility   Outcome Measure  Help needed turning from your back to your side while in a flat bed without using bedrails?: A Lot Help needed moving from lying on your back to sitting on the side of a flat bed without using bedrails?: Total Help needed moving to and from a bed to a chair (including a wheelchair)?: Total Help needed standing up from a chair using your arms (e.g., wheelchair or bedside  chair)?: Total Help needed to walk in hospital room?: Total Help needed climbing 3-5 steps with a railing? : Total 6 Click Score: 7    End of Session Equipment Utilized During Treatment: Oxygen;Left knee immobilizer Activity Tolerance: Patient tolerated treatment well;Patient limited by fatigue Patient left: in bed;with call bell/phone within reach;with nursing/sitter in room Nurse Communication: Mobility status;Patient requests pain meds PT Visit Diagnosis: Other abnormalities of gait and mobility (R26.89);History of falling (Z91.81);Muscle weakness (generalized) (M62.81);Pain Pain - Right/Left: Left Pain - part of body: Hip;Knee;Leg     Time: 8370-8342 PT Time Calculation (min) (ACUTE ONLY): 28 min  Charges:    $Therapeutic Activity: 8-22 mins PT General Charges $$ ACUTE PT VISIT: 1 Visit  Therisa SAUNDERS. PTA Acute Rehabilitation Services Office: [phone]   Therisa CHRISTELLA Boor 07/08/2024, 5:11 PM

## 2024-07-08 NOTE — Progress Notes (Signed)
 Occupational Therapy Treatment Patient Details Name: Christina Castaneda MRN: [id number] DOB: 06-22-1956 Today's Date: 07/08/2024   History of present illness Pt is a 68 y/o F presenting to [facility] ED on 6/15 after fall, found to have L femoral neck fx and AKI. S/p retrograde IMN on 6/20. Hgb drop on 6/23 with dark ostomy output, transfused, subsequent hypotension requiring pressors. Found to have perforated bleeding duodenal ulcer s/p ex lap on 6/23. Transferred to [facility] for PCCM. 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   OT comments  Pt initially resistant to participation in session, but participated well with encouragement. OT session focused on training in techniques for increased safety and independence with functional tasks. Pt demonstrating ability to complete UB ADLs with Set up/Supervision to Min assist while sitting EOB with CGA to Min assist to maintain sitting balance secondary to L lateral lean in sitting. Pt also demonstrating ability to complete bed mobility during/in preparation for functional tasks with Mod +2 to Max +2 assist. Pt reporting dizziness and nausea sitting EOB and with noted SOB with activity while sitting EOB, but with VSS on 4L continuous O2 through nasal cannula. Pt will benefit from continued acute skilled OT services. Post acute discharge, pt will benefit from intensive inpatient skilled rehab services < 3 hours per day to maximize rehab potential.       If plan is discharge home, recommend the following:  Two people to help with walking and/or transfers;Two people to help with bathing/dressing/bathroom;Assistance with cooking/housework;Assist for transportation;Help with stairs or ramp for entrance   Equipment Recommendations  Hoyer lift;Hospital bed;Wheelchair cushion (measurements OT);Wheelchair (measurements OT)    Recommendations for Other Services      Precautions / Restrictions Precautions Precautions:  Fall;Other (comment) Recall of Precautions/Restrictions: Intact Precaution/Restrictions Comments: JP drain, colostomy, sacral wound Required Braces or Orthoses: Knee Immobilizer - Left Knee Immobilizer - Left: On when out of bed or walking Restrictions Weight Bearing Restrictions Per Provider Order: Yes LLE Weight Bearing Per Provider Order: Weight bearing as tolerated Other Position/Activity Restrictions: No L knee ROM restrictions       Mobility Bed Mobility Overal bed mobility: Needs Assistance Bed Mobility: Rolling, Sidelying to Sit, Sit to Supine Rolling: Mod assist, +2 for physical assistance, Used rails Sidelying to sit: Mod assist, +2 for physical assistance, HOB elevated, Used rails   Sit to supine: Max assist, +2 for physical assistance   General bed mobility comments: pt able to reach with RUE across to roll to L with mod A +2 to comeplete and mod A +2 needed to elavate trunk into sitting, pt needing max A +2 to maange LEs into bed, able to self lower trunk    Transfers Overall transfer level: Needs assistance                 General transfer comment: nt this session due to pain and nausea     Balance Overall balance assessment: Needs assistance Sitting-balance support: Single extremity supported, Bilateral upper extremity supported, Feet supported Sitting balance-Leahy Scale: Poor Sitting balance - Comments: able to maintain static and dynamic sitting balance with minA - CGA for safety cues for midline posture as pt with tendency for L lateral lean propping on elbow Postural control: Left lateral lean                                 ADL either performed or assessed  with clinical judgement   ADL Overall ADL's : Needs assistance/impaired     Grooming: Wash/dry hands;Wash/dry face;Set up;Supervision/safety;Sitting;Cueing for safety Grooming Details (indicate cue type and reason): Sitting EOB with B feet supported with CGA to Min assist to  maintain seated balance         Upper Body Dressing : Contact guard assist;Minimal assistance;Sitting Upper Body Dressing Details (indicate cue type and reason): Sitting EOB with B feet supported with CGA to Min assist to maintain seated balance Lower Body Dressing: Maximal assistance;Bed level                 General ADL Comments: Pt with decreased activity tolerance with pt fatiguing quickly and with noted SOB with VSS with participation in minimal activity at EOB    Extremity/Trunk Assessment Upper Extremity Assessment Upper Extremity Assessment: Right hand dominant;Generalized weakness   Lower Extremity Assessment Lower Extremity Assessment: Defer to PT evaluation        Vision       Perception     Praxis     Communication Communication Communication: No apparent difficulties   Cognition Arousal: Alert Behavior During Therapy: WFL for tasks assessed/performed, Anxious (Overall WFL; signs of anxiety when preparing for and participating in bed mobility) Cognition: No apparent impairments                               Following commands: Intact        Cueing   Cueing Techniques: Verbal cues  Exercises      Shoulder Instructions       General Comments VSS on 4L continuous O2 through nasal cannula; RN present at end of session    Pertinent Vitals/ Pain       Pain Assessment Pain Assessment: Faces Faces Pain Scale: Hurts little more Pain Location: LLE with movement Pain Descriptors / Indicators: Guarding, Grimacing, Sore Pain Intervention(s): Limited activity within patient's tolerance, Monitored during session, Repositioned, Patient requesting pain meds-RN notified  Home Living                                          Prior Functioning/Environment              Frequency  Min 1X/week        Progress Toward Goals  OT Goals(current goals can now be found in the care plan section)  Progress towards OT  goals: Progressing toward goals  Acute Rehab OT Goals Patient Stated Goal: to continue with rehab and get stronger so she can go home  Plan      Co-evaluation    PT/OT/SLP Co-Evaluation/Treatment: Yes Reason for Co-Treatment: For patient/therapist safety;To address functional/ADL transfers PT goals addressed during session: Mobility/safety with mobility;Balance OT goals addressed during session: ADL's and self-care      AM-PAC OT 6 Clicks Daily Activity     Outcome Measure   Help from another person eating meals?: A Little Help from another person taking care of personal grooming?: A Little Help from another person toileting, which includes using toliet, bedpan, or urinal?: Total Help from another person bathing (including washing, rinsing, drying)?: A Lot Help from another person to put on and taking off regular upper body clothing?: A Little Help from another person to put on and taking off regular lower body clothing?: A Lot 6 Click Score: 14  End of Session    OT Visit Diagnosis: Other abnormalities of gait and mobility (R26.89);Muscle weakness (generalized) (M62.81)   Activity Tolerance Patient tolerated treatment well   Patient Left in bed;with call bell/phone within reach;with nursing/sitter in room   Nurse Communication Mobility status;Patient requests pain meds        Time: 8370-8342 OT Time Calculation (min): 28 min  Charges: OT General Charges $OT Visit: 1 Visit OT Treatments $Self Care/Home Management : 8-22 mins  Margarie Rockey HERO., OTR/L, MA Acute Rehab [phone]   Margarie FORBES Horns 07/08/2024, 5:47 PM

## 2024-07-08 NOTE — Evaluation (Addendum)
 Clinical/Bedside Swallow Evaluation Patient Details  Name: Christina Castaneda MRN: [id number] Date of Birth: [birth year]  Today's Date: 07/08/2024 Time: SLP Start Time (ACUTE ONLY): 0935 SLP Stop Time (ACUTE ONLY): 0950 SLP Time Calculation (min) (ACUTE ONLY): 15 min  Past Medical History:  Past Medical History:  Diagnosis Date   Asthma    mild   Atypical endometrial hyperplasia    COPD (chronic obstructive pulmonary disease) (HCC)    Goiter    Gout    Heart murmur    Hypertension    Hypothyroidism    Vitamin B12 deficiency    Yeast infection    for last 3 weeks   Past Surgical History:  Past Surgical History:  Procedure Laterality Date   ABDOMINAL WALL DEFECT REPAIR N/A 04/18/2016   Procedure: CLOSURE OF ABDOMEN;  Surgeon: Debby Shipper, MD;  Location: [facility] OR;  Service: General;  Laterality: N/A;   APPENDECTOMY     APPLICATION OF WOUND VAC N/A 04/02/2016   Procedure: application of wound vac+;  Surgeon: Camellia Blush, MD;  Location: [facility] OR;  Service: General;  Laterality: N/A;   CHOLECYSTECTOMY  2009   gallstone removed   COLOSTOMY N/A 03/30/2016   Procedure: COLOSTOMY;  Surgeon: Donnice Bury, MD;  Location: [facility] OR;  Service: General;  Laterality: N/A;   COLOSTOMY REVISION N/A 03/28/2016   Procedure: COLOSTOMY REVISION;  Surgeon: Lynwood Pina, MD;  Location: [facility] OR;  Service: General;  Laterality: N/A;   COLOSTOMY REVISION N/A 03/30/2016   Procedure: COLON RESECTION LEFT;  Surgeon: Donnice Bury, MD;  Location: [facility] OR;  Service: General;  Laterality: N/A;   FEMUR IM NAIL Left 06/12/2024   Procedure: INSERTION, INTRAMEDULLARY ROD, FEMUR, RETROGRADE;  Surgeon: Onesimo Oneil LABOR, MD;  Location: [facility] ORS;  Service: Orthopedics;  Laterality: Left;   IR ANGIOGRAM VISCERAL SELECTIVE  06/25/2024   IR ANGIOGRAM VISCERAL SELECTIVE  06/25/2024   IR ANGIOGRAM VISCERAL SELECTIVE  06/25/2024   IR EMBO ART  VEN HEMORR LYMPH EXTRAV  INC GUIDE ROADMAPPING  06/25/2024   IR US  GUIDE VASC ACCESS RIGHT  06/25/2024    LAPAROTOMY N/A 03/28/2016   Procedure: EXPLORATORY LAPAROTOMY;  Surgeon: Lynwood Pina, MD;  Location: [facility] OR;  Service: General;  Laterality: N/A;   LAPAROTOMY N/A 04/02/2016   Procedure: Re-exploration of open abdomen, application of abdominal wound vac;  Surgeon: Camellia Blush, MD;  Location: [facility] OR;  Service: General;  Laterality: N/A;   LAPAROTOMY N/A 04/04/2016   Procedure: EXPLORATORY LAPAROTOMY, PLACEMENT OF ABRA ABDOMINAL WALL CLOSURE SET ;  Surgeon: Camellia Blush, MD;  Location: [facility] OR;  Service: General;  Laterality: N/A;   LAPAROTOMY N/A 04/18/2016   Procedure: EXPLORATORY LAPAROTOMY;  Surgeon: Debby Shipper, MD;  Location: [facility] OR;  Service: General;  Laterality: N/A;   LAPAROTOMY N/A 06/15/2024   Procedure: LAPAROTOMY, EXPLORATORY OMENTAL GRAHAM PATCH REPAIR.;  Surgeon: Evonnie Dorothyann LABOR, DO;  Location: [facility] ORS;  Service: General;  Laterality: N/A;   LYSIS OF ADHESION N/A 06/15/2024   Procedure: LAPAROTOMY, FOR LYSIS OF ADHESIONS;  Surgeon: Evonnie Dorothyann LABOR, DO;  Location: [facility] OR[facility];  Service: General;  Laterality: N/A;   NECK SURGERY  1994   repair disk   OMENTECTOMY N/A 03/28/2016   Procedure: OMENTECTOMY;  Surgeon: Lynwood Pina, MD;  Location: [facility] OR;  Service: General;  Laterality: N/A;   ROBOTIC ASSISTED TOTAL HYSTERECTOMY WITH BILATERAL SALPINGO OOPHERECTOMY  11/18/2012   Procedure: ROBOTIC ASSISTED TOTAL HYSTERECTOMY WITH BILATERAL SALPINGO OOPHORECTOMY;  Surgeon: Elenore A. Dodie, MD;  Location: [facility] ORS;  Service: Gynecology;  Laterality: N/A;   TONSILLECTOMY     as a child   VACUUM ASSISTED CLOSURE CHANGE N/A 03/30/2016   Procedure: ABDOMINAL VAC CHANGE;  Surgeon: Donnice Bury, MD;  Location: [facility] OR;  Service: General;  Laterality: N/A;   WOUND DEBRIDEMENT N/A 03/28/2016   Procedure: DEBRIDEMENT WOUND;  Surgeon: Lynwood Pina, MD;  Location: [facility] OR;  Service: General;  Laterality: N/A;   HPI:  Christina Castaneda is an 68 y.o. female past medical history of perforated sigmoid colon status  post hamartoma, essential hypertension was admitted to [facility] after a mechanical fall which led to a left distal femur fracture underwent nailing by orthopedic surgery on 06/12/2024.  Her course was complicated by hemorrhagic shock in the setting of a perforated duodenal ulcer underwent emergent exploratory laparotomy with a Arlyss patch on 06/12/2024 transferred to the [facility] ICU, postop she developed melanotic stool through her ostomy leading to acute blood loss anemia, ultimately IR was consulted and she status post GDA embolization on 06/26/2019 5 GI on board no plan for EGD on less risk of perforation. Recent CXR states Persistent moderate volume left and smaller volume right pleural effusions 2. Continued improvement of left upper lung ventilation since 07/05/2024.    Assessment / Plan / Recommendation  Clinical Impression  A bedside swallow evaluation was completed to assess for s/sx of oropharyngeal dysphagia. Oral mechanism exam WFL. POs administered included thin liquids via cup and straw, puree and solids. Patient with mildly prolonged mastication secondary to edentulous state with dentures unavailable. Complete oral clearance. Patient with x2 instances of delayed cough between boluses of puree PO and subsequent expectoration of phlegm. This may be due to aspiration or due to respiratory status. Of note, patient refused to sit completely upright during PO intake which SLP educated on importance of upright positioning to reduce aspiration risk. Patient verbalized understanding. Recommend continuation of current diet as CXR seemingly improving since diet initiation and no s/sx of aspiration immediately after PO intake. Patient should use of standardized precautions including sitting upright during PO and taking small bites/sips at a slow rate. Recommend intermittent supervision during mealtimes. SLP to follow for tolerance.    SLP Visit Diagnosis: Dysphagia, unspecified (R13.10)    Aspiration  Risk  Mild aspiration risk    Diet Recommendation Dysphagia 3 (Mech soft);Thin liquid    Liquid Administration via: Cup;Straw Medication Administration: Whole meds with liquid Supervision: Intermittent supervision to cue for compensatory strategies Compensations: Slow rate;Small sips/bites Postural Changes: Seated upright at 90 degrees    Other  Recommendations Oral Care Recommendations: Oral care BID    Functional Status Assessment Patient has had a recent decline in their functional status and demonstrates the ability to make significant improvements in function in a reasonable and predictable amount of time.  Frequency and Duration min 2x/week  2 weeks    Prognosis Prognosis for improved oropharyngeal function: Good Barriers/Prognosis Comment: n/a      Swallow Study   General HPI: Christina Castaneda is an 68 y.o. female past medical history of perforated sigmoid colon status post hamartoma, essential hypertension was admitted to [facility] after a mechanical fall which led to a left distal femur fracture underwent nailing by orthopedic surgery on 06/12/2024.  Her course was complicated by hemorrhagic shock in the setting of a perforated duodenal ulcer underwent emergent exploratory laparotomy with a Graham patch on 06/12/2024 transferred to the [facility] ICU, postop she developed melanotic stool through her ostomy leading to acute blood loss  anemia, ultimately IR was consulted and she status post GDA embolization on 06/26/2019 5 GI on board no plan for EGD on less risk of perforation. Recent CXR states Persistent moderate volume left and smaller volume right pleural effusions 2. Continued improvement of left upper lung ventilation since 07/05/2024. Type of Study: Bedside Swallow Evaluation Previous Swallow Assessment: 2017 Diet Prior to this Study: Dysphagia 3 (mechanical soft);Thin liquids (Level 0) Respiratory Status: Nasal cannula Behavior/Cognition: Alert;Cooperative Oral Cavity  Assessment: Within Functional Limits Oral Cavity - Dentition: Dentures, not available Vision: Functional for self-feeding Self-Feeding Abilities: Able to feed self Patient Positioning: Partially reclined (unable to sit completely upright) Volitional Cough: Strong Volitional Swallow: Able to elicit    Oral/Motor/Sensory Function Overall Oral Motor/Sensory Function: Within functional limits   Ice Chips Ice chips: Not tested   Thin Liquid Thin Liquid: Within functional limits Presentation: Cup;Straw;Self Fed    Nectar Thick Nectar Thick Liquid: Not tested   Honey Thick Honey Thick Liquid: Not tested   Puree Puree: Impaired Pharyngeal Phase Impairments: Cough - Delayed   Solid     Solid: Impaired Oral Phase Impairments: Impaired mastication (dentures not avaliable) Oral Phase Functional Implications: Impaired mastication      Consuelo Suthers M.A., CCC-SLP 07/08/2024,10:03 AM

## 2024-07-09 LAB — CBC WITH DIFFERENTIAL/PLATELET
Abs Immature Granulocytes: 0.07 K/uL (ref 0.00–0.07)
Basophils Absolute: 0 K/uL (ref 0.0–0.1)
Basophils Relative: 1 %
Eosinophils Absolute: 0.2 K/uL (ref 0.0–0.5)
Eosinophils Relative: 3 %
HCT: 33.9 % — ABNORMAL LOW (ref 36.0–46.0)
Hemoglobin: 10.8 g/dL — ABNORMAL LOW (ref 12.0–15.0)
Immature Granulocytes: 1 %
Lymphocytes Relative: 29 %
Lymphs Abs: 1.8 K/uL (ref 0.7–4.0)
MCH: 30.7 pg (ref 26.0–34.0)
MCHC: 31.9 g/dL (ref 30.0–36.0)
MCV: 96.3 fL (ref 80.0–100.0)
Monocytes Absolute: 0.4 K/uL (ref 0.1–1.0)
Monocytes Relative: 6 %
Neutro Abs: 3.8 K/uL (ref 1.7–7.7)
Neutrophils Relative %: 60 %
Platelets: 256 K/uL (ref 150–400)
RBC: 3.52 MIL/uL — ABNORMAL LOW (ref 3.87–5.11)
RDW: 15.7 % — ABNORMAL HIGH (ref 11.5–15.5)
WBC: 6.3 K/uL (ref 4.0–10.5)
nRBC: 0 % (ref 0.0–0.2)

## 2024-07-09 LAB — GLUCOSE, CAPILLARY
Glucose-Capillary: 70 mg/dL (ref 70–99)
Glucose-Capillary: 97 mg/dL (ref 70–99)
Glucose-Capillary: 75 mg/dL (ref 70–99)
Glucose-Capillary: 93 mg/dL (ref 70–99)

## 2024-07-09 MED ORDER — ORAL CARE MOUTH RINSE: 15.0000 mL | OROMUCOSAL | Status: DC | PRN

## 2024-07-09 MED ORDER — ORAL CARE MOUTH RINSE
15.0000 mL | OROMUCOSAL | Status: DC | PRN
Start: 2024-07-09 — End: 2024-07-10

## 2024-07-09 MED ORDER — ORAL CARE MOUTH RINSE
15.0000 mL | OROMUCOSAL | Status: DC
  Administered 2024-07-09 (×3): 15 mL via OROMUCOSAL

## 2024-07-09 NOTE — Progress Notes (Signed)
 [facility] PROGRESS NOTE    Progress Note  Christina Castaneda  FMW:[id number] DOB: [birth year] DOA: 06/07/2024 PCP: [name], DO     Brief Narrative:   Christina Castaneda is an 68 y.o. female past medical history of perforated sigmoid colon status post hamartoma, essential hypertension was admitted to [facility] after a mechanical fall which led to a left distal femur fracture underwent nailing by orthopedic surgery on 06/12/2024.  Her course was complicated by hemorrhagic shock in the setting of a perforated duodenal ulcer underwent emergent exploratory laparotomy with a Arlyss patch on 06/12/2024 transferred to the [facility] ICU, postop she developed melanotic stool through her ostomy leading to acute blood loss anemia, ultimately IR was consulted and she status post GDA embolization on 06/26/2019 5 GI on board no plan for EGD on less risk of perforation.  Significant Events: 6/15 admit to [facility] after fall days earlier, L femur fx and AKI 6/20 OR w ortho 6/23 hgb drop, shock -- found to have perforated bleeding duodenal ulcer  6/24 extubated 6/25 off pressors, txf out of ICU  6/26 [facility] assumed care 7/2 melena in ostomy 7/3 GDA embolization 7/10 fixed dose vasopressin  infusion 7/11 brown stools in ostomy 7/13 PCCM reconsulted for complete left lung atelectasis 7/14 CXR with some resolution and she is stable so will return to floor 7/15 [facility] assumed care Significant studies: 6/15>> CT left knee: Acute comminuted fracture of distal left femoral shaft. 6/16>> renal ultrasound: No hydronephrosis 6/23>> CT angiogram abdomen: Perforated and bleeding peptic ulcer disease affecting proximal duodenum-appears to be hematoma in the gastric antrum/proximal duodenum. 6/26>> upper GI series with KUB: No evidence of contrast leak. 7/02>> CT angio GI bleed study: No active bleeding. 7/07>> CT angio GI bleed study: No active bleeding 7/09>> CT angio GI bleed; no active  bleeding  Antibiotics: 06/15/2024 IV Zosyn  stopped on 06/20/2024. Received a single dose of fluconazole  and vancomycin  on 06/15/2024   Significant microbiology data: 6/23>> blood culture: No growth 6/23>> peritoneal fluid: Abundant Candida albicans/moderate Candida glabrata.   Assessment/Plan:   Acute respiratory failure with hypoxia due to complete left lung atelectasis: Pulmonary was consulted due to mucous plugging.  Pulmonary recommended no bronchoscopy on 07/06/2024. Continue CPT and forced vital capacity. She status post hypertonic saline nebs. Will need to be cautious with sedation. Repeated chest x-ray showed bilateral effusions but improving irrigation of the left upper lung She has remained afebrile with no leukocytosis. Continue to hold anticoagulation due to high risk of bleeding. Try to encourage incentive spirometry is much as possible patient does not appear to be motivated. Out of bed to chair try to wean to room air.  Peptic ulcer perforation and hemorrhage (HCC) status post repair with a Arlyss patch and repair on 06/15/2024/acute hemorrhagic shock due to duodenal ulcer, with a JP drain placed: Status post GDA embolization on 06/25/2024. General surgery remove drain now on a soft diet. GI on board, for EGD in 4 to 6 weeks postdischarge. Continue dysphagia 3 diet. Cont. PPI and Carafate  Hemoglobin is trending up.  Mechanical fall with a left femoral fracture: Status post repair on 06/12/2024 femoral nailing. Weightbearing as tolerated knee immobilizer in place. Evaluated by PT OT, will need skilled nursing facility placement.  Dysphagia: SLP  Obstructive sleep apnea: Suspect untreated obstructive sleep apnea, the patient is obese and debilitated with poor abdominal compliance contributing to respiratory failure. It would be optimal she would tolerate CPAP which she has not been able to. Continue pulmonary hygiene,  incentive spirometry and flutter valve. At  baseline she is on scheduled Brovana  and Pulmicort .  Sacral decubitus ulcer stage III: Develop wound related to PureWick Foley in place temporarily. Can discontinue Foley upon discharge.  Urinary retention: Now resolved Foley in place for wound healing.  Hyperlipidemia: Continue statins.  Acute kidney injury: Likely due to hemorrhagic shock now resolved.  Gout: Continue to hold allopurinol .  Hypothyroidism Continue Synthroid .  Hypotension/essential hypertension: All antihypertensive medications were held she was started on low-dose midodrine , will continue to titrate down.      DVT prophylaxis: SCD's Family Communication:none Status is: Inpatient Remains inpatient appropriate because: Left hip fracture and acute perforated ulcer    Code Status:     Code Status Orders  (From admission, onward)           Start     Ordered   06/15/24 1938  Full code  Continuous       Question:  By:  Answer:  Consent: discussion documented in EHR   06/15/24 1937           Code Status History     Date Active Date Inactive Code Status Order ID Comments User Context   06/07/2024 2035 06/15/2024 1937 Full Code [id number]  [name], MD ED   05/27/2024 1359 05/31/2024 2032 Full Code [id number]  [name] LABOR, MD ED   05/02/2016 1941 05/23/2016 1436 Full Code [id number]  [name], PA-C Inpatient   05/02/2016 1941 05/02/2016 1941 Full Code [id number]  [name] DEVONNA Inpatient   04/08/2016 0803 05/02/2016 1941 Full Code [id number]  [name], MD Inpatient   11/18/2012 1719 11/19/2012 1509 Full Code [id number]  Means, [name], RN Inpatient         IV Access:   Peripheral IV   Procedures and diagnostic studies:   DG CHEST PORT 1 VIEW Result Date: 07/08/2024 CLINICAL DATA:  68 year old female with pleural effusions. Shortness of breath. EXAM: PORTABLE CHEST 1 VIEW COMPARISON:  Portable chest 07/06/2024 and earlier. FINDINGS: Portable AP semi upright view at 0546  hours. Moderate volume left and smaller volume veiling right pleural effusions persist. Dense left lung base opacification, but left upper lung ventilation has improved since 07/05/2024. No pneumothorax. Mediastinal contours chronically shifted to the left, probably from a degree of left lung volume loss. Stable right PICC line. Stable visualized osseous structures. Paucity of bowel gas in the visible abdomen. IMPRESSION: 1. Persistent moderate volume left and smaller volume right pleural effusions. 2. Continued improvement of left upper lung ventilation since 07/05/2024. 3. Stable right PICC line. Electronically Signed   By: VEAR Hurst M.D.   On: 07/08/2024 09:36     Medical Consultants:   None.   Subjective:    Christina Castaneda abdominal pain is resolved urology feels much better today.  Objective:    Vitals:   07/08/24 1952 07/08/24 2100 07/09/24 0508 07/09/24 0737  BP:  131/61 130/79 129/76  Pulse:  70 60 (!) 54  Resp:  20 19 16   Temp:  98.2 F (36.8 C) 97.6 F (36.4 C) 97.8 F (36.6 C)  TempSrc:  Oral Oral   SpO2: 95% 96% 99% 99%  Weight:      Height:       SpO2: 99 % O2 Flow Rate (L/min): 4 L/min FiO2 (%): 36 %   Intake/Output Summary (Last 24 hours) at 07/09/2024 0741 Last data filed at 07/09/2024 0600 Gross per 24 hour  Intake 250 ml  Output 2050 ml  Net -1800 ml   Filed Weights   07/04/24 0500 07/05/24 0400 07/06/24 0212  Weight: 103.6 kg 104.2 kg 104.2 kg    Exam: General exam: In no acute distress. Respiratory system: Good air movement and clear to auscultation. Cardiovascular system: S1 & S2 heard, RRR. No JVD. Gastrointestinal system: Abdomen is nondistended, soft and nontender.  Extremities: Left leg in brace Skin: No rashes, lesions or ulcers Psychiatry: Judgement and insight appear normal. Mood & affect appropriate.  Data Reviewed:    Labs: Basic Metabolic Panel: Recent Labs  Lab 07/04/24 0351 07/05/24 0358 07/06/24 0331 07/07/24 0235  07/08/24 0520  NA 133* 135 136 136 135  K 3.2* 4.3 3.8 4.0 3.9  CL 99 104 97* 99 95*  CO2 28 27 31 31  32  GLUCOSE 76 77 77 81 77  BUN 12 12 9 13 11   CREATININE 0.76 0.82 0.87 0.79 0.80  CALCIUM  7.8* 8.1* 8.2* 8.1* 8.3*  MG 1.9 2.1 2.0 1.9  --   PHOS 2.6 2.2* 3.5 2.6  --    GFR Estimated Creatinine Clearance: 78.4 mL/min (by C-G formula based on SCr of 0.8 mg/dL). Liver Function Tests: No results for input(s): AST, ALT, ALKPHOS, BILITOT, PROT, ALBUMIN  in the last 168 hours. No results for input(s): LIPASE, AMYLASE in the last 168 hours. No results for input(s): AMMONIA in the last 168 hours. Coagulation profile No results for input(s): INR, PROTIME in the last 168 hours. COVID-19 Labs  No results for input(s): DDIMER, FERRITIN, LDH, CRP in the last 72 hours.  Lab Results  Component Value Date   SARSCOV2NAA NEGATIVE 05/27/2024    CBC: Recent Labs  Lab 07/05/24 0358 07/05/24 2055 07/06/24 0331 07/07/24 0235 07/08/24 0520 07/09/24 0338  WBC 5.3 8.1 6.6 6.5 5.3 6.3  NEUTROABS 3.5  --  4.6 4.2 3.4 3.8  HGB 7.1* 10.2* 10.0* 10.3* 10.7* 10.8*  HCT 22.4* 31.8* 31.1* 32.9* 34.0* 33.9*  MCV 97.4 95.2 95.4 98.2 97.1 96.3  PLT 212 264 236 250 249 256   Cardiac Enzymes: No results for input(s): CKTOTAL, CKMB, CKMBINDEX, TROPONINI in the last 168 hours. BNP (last 3 results) No results for input(s): PROBNP in the last 8760 hours. CBG: Recent Labs  Lab 07/08/24 0525 07/08/24 1145 07/08/24 1710 07/08/24 2152 07/09/24 0623  GLUCAP 74 75 104* 106* 70   D-Dimer: No results for input(s): DDIMER in the last 72 hours. Hgb A1c: No results for input(s): HGBA1C in the last 72 hours. Lipid Profile: No results for input(s): CHOL, HDL, LDLCALC, TRIG, CHOLHDL, LDLDIRECT in the last 72 hours. Thyroid  function studies: No results for input(s): TSH, T4TOTAL, T3FREE, THYROIDAB in the last 72 hours.  Invalid input(s):  FREET3 Anemia work up: No results for input(s): VITAMINB12, FOLATE, FERRITIN, TIBC, IRON, RETICCTPCT in the last 72 hours. Sepsis Labs: Recent Labs  Lab 07/06/24 0331 07/07/24 0235 07/08/24 0520 07/09/24 0338  WBC 6.6 6.5 5.3 6.3   Microbiology No results found for this or any previous visit (from the past 240 hours).   Medications:    acetaminophen   650 mg Oral QID   budesonide  (PULMICORT ) nebulizer solution  0.5 mg Nebulization BID   Chlorhexidine  Gluconate Cloth  6 each Topical Daily   diphenhydrAMINE   25 mg Intravenous Once   feeding supplement  237 mL Oral TID BM   guaiFENesin   600 mg Oral BID   ipratropium-albuterol   3 mL Nebulization BID   levothyroxine   200 mcg Oral Q0600   midodrine   2.5 mg Oral  BID WC   multivitamin with minerals  1 tablet Oral Daily   nystatin    Topical BID   pantoprazole   40 mg Oral BID   sodium chloride  flush  10-40 mL Intracatheter Q12H   sucralfate   1 g Oral TID WC & HS   Continuous Infusions:  sodium chloride         LOS: 32 days   Christina Castaneda  [facility]  07/09/2024, 7:41 AM

## 2024-07-09 NOTE — TOC Progression Note (Addendum)
 Transition of Care [facility]) - Progression Note    Patient Details  Name: Christina Castaneda MRN: [id number] Date of Birth: July 16, 1956  Transition of Care [facility]) CM/SW Contact  Bridget Cordella Simmonds, LCSW Phone Number: 07/09/2024, 3:44 PM  Clinical Narrative:   CSW message with DON at [facility]: they are full and do not anticipate an available bed for 2 weeks.  1540: CSW spoke with pt regarding the above, she asked CSW to call husband Christina Castaneda, CSW spoke with him by phone.  Discussed bed offers at [facility], they will accept at [facility].  CSW confirmed with [facility] that they can receive pt today.    SNF auth request submitted in [place].   Expected Discharge Plan: Skilled Nursing Facility Barriers to Discharge: Continued Medical Work up, English as a second language teacher  Expected Discharge Plan and Services In-house Referral: Clinical Social Work Discharge Planning Services: [employer] Consult Post Acute Care Choice: Skilled Nursing Facility Living arrangements for the past 2 months: Single Family Home                                       Social Determinants of Health (SDOH) Interventions SDOH Screenings   Food Insecurity: No Food Insecurity (06/08/2024)  Housing: Low Risk  (06/08/2024)  Transportation Needs: No Transportation Needs (06/08/2024)  Utilities: Not At Risk (06/08/2024)  Social Connections: Unknown (06/08/2024)  Recent Concern: Social Connections - Moderately Isolated (05/27/2024)  Tobacco Use: High Risk (06/25/2024)    Readmission Risk Interventions    05/27/2024   10:01 PM  Readmission Risk Prevention Plan  Post Dischage Appt Complete  Medication Screening Complete  Transportation Screening Complete

## 2024-07-09 NOTE — Plan of Care (Signed)
  Problem: Education: Goal: Knowledge of General Education information will improve Description: Including pain rating scale, medication(s)/side effects and non-pharmacologic comfort measures Outcome: Progressing   Problem: Health Behavior/Discharge Planning: Goal: Ability to manage health-related needs will improve Outcome: Progressing   Problem: Clinical Measurements: Goal: Ability to maintain clinical measurements within normal limits will improve Outcome: Progressing Goal: Will remain free from infection Outcome: Progressing Goal: Diagnostic test results will improve Outcome: Progressing Goal: Respiratory complications will improve Outcome: Progressing Goal: Cardiovascular complication will be avoided Outcome: Progressing   Problem: Nutrition: Goal: Adequate nutrition will be maintained Outcome: Progressing   Problem: Coping: Goal: Level of anxiety will decrease Outcome: Progressing   Problem: Elimination: Goal: Will not experience complications related to bowel motility Outcome: Progressing Goal: Will not experience complications related to urinary retention Outcome: Progressing   Problem: Pain Managment: Goal: General experience of comfort will improve and/or be controlled Outcome: Progressing

## 2024-07-09 NOTE — Progress Notes (Signed)
 SLP Cancellation Note  Patient Details Name: Christina Castaneda MRN: [id number] DOB: 04-06-1956   Cancelled treatment:       Reason Eval/Treat Not Completed: Patient declined, no reason specified; pt had eaten breakfast tray; refused further po's, no concerns per pt report with swallowing function.  Continue Dysphagia 3/thin liquid diet. ST f/u at next venue of care prn.  ST will s/o in acute setting.     Pat Elver Stadler,M.S.,CCC-SLP 07/09/2024, 8:53 AM

## 2024-07-09 NOTE — Progress Notes (Signed)
 Nutrition Follow-up  DOCUMENTATION CODES:  Obesity unspecified  INTERVENTION:  Continue current diet as ordered Encourage PO intake Continue Ensure Plus High Protein to TID, provides 350kcal and 20g protein per serving MVI with minerals daily  NUTRITION DIAGNOSIS:  Increased nutrient needs related to acute illness, wound healing as evidenced by estimated needs. - remains applicable  GOAL:  Patient will meet greater than or equal to 90% of their needs - progressing  MONITOR:  Diet advancement, Labs, Weight trends, I & O's, Skin  REASON FOR ASSESSMENT:  Consult Wound healing  ASSESSMENT:  Pt admitted after a recent fall leading to L distal femur fracture s/p L femoral nailing. PMH significant for perforated sigmoid colon s/p Hartmann procedure, s/p colostomy revision, HTN.  6/20: s/p L femoral nail 6/23: perforated bleeding duodenal ulcer; s/p exlap with graham patch repair, 1 prbc transfused 6/26: NGT removed, UGI: no leak; txr out of ICU, TPN initiated, clear liquid diet initiated 6/30: TPN discontinued 7/02: black/tarry ostomy output, Hgb 6.1; 2 PRBC transfused; CTA: no active bleeding, deferred embolization; resumed clear liquid diet 7/03: more melena, IR reconsulted and embolization scheduled for today; 1 unit PRBC transfused  7/09 - repeat CT GI bleed: negative; Hgb 7.0 - 1 unit PRBC transfused   Checked in with pt at bedside. She is in good spirits today. Looking forward to hopeful upcoming discharge.   She states that her appetite is improving. She is consuming protein supplements to augment intake.   Meal completions: 7/14: 5% breakfast, 100% lunch 7/15: 25% lunch 7/17: 100% breakfast  Admit weight: 122.8 kg   Current weight: 104.2 kg (7/14) No updated weight x3 days  Medications: MVI, sucralfate  QID  Labs: reviewed  CBG's 70-106 x24 hours  Diet Order:   Diet Order             DIET DYS 3 Room service appropriate? Yes; Fluid consistency: Thin  Diet  effective now                   EDUCATION NEEDS:   Not appropriate for education at this time  Skin:  Skin Assessment: Reviewed RN Assessment Skin Integrity Issues:: Stage III, Stage II, Other (Comment) Stage II: sacrum Stage III: mid coccyx Other: IAD L buttock, L groin, abdomen, bilateral sacrum, vagina  Last BM:  7/17 per pt  Height:   Ht Readings from Last 1 Encounters:  06/15/24 5' 5 (1.651 m)    Weight:   Wt Readings from Last 1 Encounters:  07/06/24 104.2 kg    Ideal Body Weight:  56.8 kg  BMI:  Body mass index is 38.23 kg/m.  Estimated Nutritional Needs:   Kcal:  1600-1800  Protein:  85-100g  Fluid:  >/=1.6L  Christina Castaneda, RDN, LDN Clinical Nutrition See AMiON for contact information.

## 2024-07-10 LAB — CBC WITH DIFFERENTIAL/PLATELET
Abs Immature Granulocytes: 0.04 K/uL (ref 0.00–0.07)
Basophils Absolute: 0 K/uL (ref 0.0–0.1)
Basophils Relative: 1 %
Eosinophils Absolute: 0.2 K/uL (ref 0.0–0.5)
Eosinophils Relative: 3 %
HCT: 33.2 % — ABNORMAL LOW (ref 36.0–46.0)
Hemoglobin: 10.5 g/dL — ABNORMAL LOW (ref 12.0–15.0)
Immature Granulocytes: 1 %
Lymphocytes Relative: 24 %
Lymphs Abs: 1.4 K/uL (ref 0.7–4.0)
MCH: 30.7 pg (ref 26.0–34.0)
MCHC: 31.6 g/dL (ref 30.0–36.0)
MCV: 97.1 fL (ref 80.0–100.0)
Monocytes Absolute: 0.4 K/uL (ref 0.1–1.0)
Monocytes Relative: 7 %
Neutro Abs: 3.7 K/uL (ref 1.7–7.7)
Neutrophils Relative %: 64 %
Platelets: 240 K/uL (ref 150–400)
RBC: 3.42 MIL/uL — ABNORMAL LOW (ref 3.87–5.11)
RDW: 15.8 % — ABNORMAL HIGH (ref 11.5–15.5)
WBC: 5.8 K/uL (ref 4.0–10.5)
nRBC: 0 % (ref 0.0–0.2)

## 2024-07-10 LAB — GLUCOSE, CAPILLARY
Glucose-Capillary: 74 mg/dL (ref 70–99)
Glucose-Capillary: 108 mg/dL — ABNORMAL HIGH (ref 70–99)
Glucose-Capillary: 109 mg/dL — ABNORMAL HIGH (ref 70–99)
Glucose-Capillary: 127 mg/dL — ABNORMAL HIGH (ref 70–99)
Glucose-Capillary: 101 mg/dL — ABNORMAL HIGH (ref 70–99)

## 2024-07-10 MED ORDER — OXYCODONE HCL 5 MG PO TABS: 5.0000 mg | ORAL_TABLET | ORAL | 0 refills | Status: AC | PRN

## 2024-07-10 MED ORDER — SIMETHICONE 80 MG PO CHEW
80.0000 mg | CHEWABLE_TABLET | Freq: Four times a day (QID) | ORAL | Status: DC | PRN
  Administered 2024-07-10 (×2): 80 mg via ORAL
  Filled 2024-07-10 (×2): qty 1

## 2024-07-10 MED ORDER — PANTOPRAZOLE SODIUM 40 MG PO TBEC: 40.0000 mg | DELAYED_RELEASE_TABLET | Freq: Two times a day (BID) | ORAL | Status: DC

## 2024-07-10 MED ORDER — SUCRALFATE 1 GM/10ML PO SUSP: 1.0000 g | Freq: Three times a day (TID) | ORAL | Status: DC

## 2024-07-10 MED ORDER — ORAL CARE MOUTH RINSE: 15.0000 mL | OROMUCOSAL | Status: DC | PRN

## 2024-07-10 NOTE — Progress Notes (Signed)
 Physical Therapy Wound Treatment Patient Details  Name: Christina Castaneda MRN: [id number] Date of Birth: 05-18-1956  Today's Date: 07/10/2024 Time: 1200-1235 Time Calculation (min): 35 min  Subjective  Subjective Assessment Patient and Family Stated Goals: go home Date of Onset:  (unknown) Prior Treatments: cleansing, Dakins soaked gauze, covered by sacral foam  Pain Score:    Wound Assessment  Wound / Incision (Open or Dehisced) 05/27/24 (IAD) Incontinence Associated Dermatitis;Irritant Dermatitis (Moisture Associated Skin Damage) Abdomen Anterior;Left Redness to buttocks, left groin to hip area under abdomen  , bilateral sacrum , vagina (Active)  Dressing Type None 07/09/24 0814  Dressing Changed New 06/25/24 1948  Dressing Status None 07/03/24 2000  Site / Wound Assessment Pink 07/09/24 0814  Peri-wound Assessment Intact 07/09/24 0814  Wound Length (cm) 10 cm 06/17/24 1000  Wound Width (cm) 5 cm 06/17/24 1000  Wound Depth (cm) 0 cm 06/17/24 1000  Wound Volume (cm^3) 0 cm^3 06/17/24 1000  Wound Surface Area (cm^2) 50 cm^2 06/17/24 1000  Drainage Amount None 07/09/24 0814  Drainage Description Serosanguineous 06/26/24 0400  Treatment Other (Comment) 07/09/24 2000     Wound 06/08/24 1104 Pressure Injury Coccyx Medial Stage 3 -  Full thickness tissue loss. Subcutaneous fat may be visible but bone, tendon or muscle are NOT exposed. (Active)  Wound Image   07/07/24 1700  Site / Wound Assessment Pink;Red;Yellow;Friable;Granulation tissue 07/10/24 1352  Peri-wound Assessment Erythema (blanchable);Maceration;Pink;Denuded 07/10/24 1352  Wound Length (cm) 6.2 cm 07/07/24 1700  Wound Width (cm) 5.1 cm 07/07/24 1700  Wound Surface Area (cm^2) 24.83 cm^2 07/07/24 1700  Wound Depth (cm) 2.5 cm 07/07/24 1700  Wound Volume (cm^3) 41.39 cm^3 07/07/24 1700  Drainage Description Serous 07/10/24 1352  Drainage Amount Moderate 07/10/24 1352  Treatment Off loading 07/08/24 2000  Dressing Type  ABD;Barrier Film (skin prep);Santyl 07/10/24 1352  Dressing Changed Changed 07/07/24 1700  Dressing Status Clean, Dry, Intact 07/10/24 1352  State of Healing Early/partial granulation 07/10/24 1352  % Wound base Red or Granulating 60% 07/10/24 1352  % Wound base Yellow/Fibrinous Exudate 30% 07/10/24 1352  % Wound base Black/Eschar 10% 07/10/24 1352  % Wound base Other/Granulation Tissue (Comment) 0% 07/07/24 1700  Tunneling (cm) ~ 2.) at & oclock 07/10/24 1352  Margins Unattached edges (unapproximated) 07/10/24 1352     Wound 06/12/24 1528 Surgical Closed Surgical Incision Thigh Left (Active)  Site / Wound Assessment Dressing in place / Unable to assess 07/09/24 2000  Closure Staples 07/06/24 1800  Drainage Amount None 07/06/24 2240  Dressing Type Compression wrap 07/09/24 0814  Dressing Status Clean, Dry, Intact 07/09/24 2000  Treatment Off loading 07/09/24 0814        Wound Assessment and Plan  Wound Therapy - Assess/Plan/Recommendations Wound Therapy - Clinical Statement: Pt's wound bed is improving with granulated tissue however there is a tunnel that is evolving at 7 o'clock and from the appearence of the slough along the edge of the wound from 7-9 o'clock it may progress in that direction. PT recommending continued selective debridement to reduce bioburden and improve wound bed healing. Wound Therapy - Functional Problem List: decrease mobility Factors Delaying/Impairing Wound Healing: Immobility, Multiple medical problems, Polypharmacy, Vascular compromise, Incontinence Hydrotherapy Plan: Debridement, Dressing change, Patient/family education Wound Therapy - Frequency: 2X / week Wound Therapy - Follow Up Recommendations: dressing changes by RN  Wound Therapy Goals- Improve the function of patient's integumentary system by progressing the wound(s) through the phases of wound healing (inflammation - proliferation - remodeling) by: Wound Therapy Goals -  Improve the function of  patient's integumentary system by progressing the wound(s) through the phases of wound healing by: Decrease Necrotic Tissue to: 50 Decrease Necrotic Tissue - Progress: Progressing toward goal Increase Granulation Tissue to: 50 Increase Granulation Tissue - Progress: Progressing toward goal Improve Drainage Characteristics: Min Improve Drainage Characteristics - Progress: Progressing toward goal Goals/treatment plan/discharge plan were made with and agreed upon by patient/family: Yes Time For Goal Achievement: 7 days Wound Therapy - Potential for Goals: Fair  Goals will be updated until maximal potential achieved or discharge criteria met.  Discharge criteria: when goals achieved, discharge from hospital, MD decision/surgical intervention, no progress towards goals, refusal/missing three consecutive treatments without notification or medical reason.  GP     Charges PT Wound Care Charges $Wound Debridement up to 20 cm: < or equal to 20 cm $PT Hydrotherapy Dressing: 1 dressing $PT Hydrotherapy Visit: 1 Visit       Vinie GAILS Carlita Whitcomb 07/10/2024, 2:11 PM 07/10/2024  [place] HERO., PT Acute Rehabilitation Services [phone]  (office)

## 2024-07-10 NOTE — Plan of Care (Signed)
  Problem: Clinical Measurements: Goal: Ability to maintain clinical measurements within normal limits will improve Outcome: Progressing Goal: Will remain free from infection Outcome: Progressing Goal: Diagnostic test results will improve Outcome: Progressing Goal: Respiratory complications will improve Outcome: Progressing Goal: Cardiovascular complication will be avoided Outcome: Progressing   Problem: Elimination: Goal: Will not experience complications related to bowel motility Outcome: Progressing Goal: Will not experience complications related to urinary retention Outcome: Progressing   Problem: Pain Managment: Goal: General experience of comfort will improve and/or be controlled Outcome: Progressing   Problem: Safety: Goal: Ability to remain free from injury will improve Outcome: Progressing

## 2024-07-10 NOTE — Discharge Summary (Signed)
 Physician Discharge Summary  Christina Castaneda FMW:[id number] DOB: 1956/02/20 DOA: 06/07/2024  PCP: [name], DO  Admit date: 06/07/2024 Discharge date: 07/10/2024  Admitted From: Home Disposition:  SNF  Recommendations for Outpatient Follow-up:  Follow up with PCP in 1-2 weeks Please obtain BMP/CBC in one week ts:  Home Health:No Equipment/Devices:None  Discharge Condition:Stable CODE STATUS:Full Diet recommendation: Heart Healthy   Brief/Interim Summary: 69 y.o. female past medical history of perforated sigmoid colon status post hamartoma, essential hypertension was admitted to [facility] after a mechanical fall which led to a left distal femur fracture underwent nailing by orthopedic surgery on 06/12/2024.  Her course was complicated by hemorrhagic shock in the setting of a perforated duodenal ulcer underwent emergent exploratory laparotomy with a Arlyss patch on 06/12/2024 transferred to the [facility] ICU, postop she developed melanotic stool through her ostomy leading to acute blood loss anemia, ultimately IR was consulted and she status post GDA embolization on 06/26/2019 5 GI on board no plan for EGD on less risk of perforation.   Significant Events: 6/15 admit to [facility] after fall days earlier, L femur fx and AKI 6/20 OR w ortho 6/23 hgb drop, shock -- found to have perforated bleeding duodenal ulcer  6/24 extubated 6/25 off pressors, txf out of ICU  6/26 [facility] assumed care 7/2 melena in ostomy 7/3 GDA embolization 7/10 fixed dose vasopressin  infusion 7/11 brown stools in ostomy 7/13 PCCM reconsulted for complete left lung atelectasis 7/14 CXR with some resolution and she is stable so will return to floor 7/15 [facility] assumed care Significant studies: 6/15>> CT left knee: Acute comminuted fracture of distal left femoral shaft. 6/16>> renal ultrasound: No hydronephrosis 6/23>> CT angiogram abdomen: Perforated and bleeding peptic ulcer disease affecting proximal duodenum-appears to  be hematoma in the gastric antrum/proximal duodenum. 6/26>> upper GI series with KUB: No evidence of contrast leak. 7/02>> CT angio GI bleed study: No active bleeding. 7/07>> CT angio GI bleed study: No active bleeding 7/09>> CT angio GI bleed; no active bleeding   Antibiotics: 06/15/2024 IV Zosyn  stopped on 06/20/2024. Received a single dose of fluconazole  and vancomycin  on 06/15/2024   Significant microbiology data: 6/23>> blood culture: No growth 6/23>> peritoneal fluid: Abundant Candida albicans/moderate Candida glabrata.    Discharge Diagnoses:  Principal Problem:   Peptic ulcer perforation and hemorrhage (HCC) Active Problems:   AKI (acute kidney injury) (HCC)   Leukocytosis   Hypothyroid   COPD  GOLD 2/AB   OSA (obstructive sleep apnea)   Obesity   Tobacco abuse   Essential hypertension   ABLA (acute blood loss anemia)   Generalized weakness   Chronic pain syndrome   Morbid obesity due to excess calories (HCC)   S/P colostomy (HCC)   Chronic venous insufficiency   Cigarette smoker   Sepsis (HCC)   Femur fracture, left (HCC)   Hemorrhagic shock (HCC)   Thrombocytopenia (HCC)   Duodenal ulcer   Intra-abdominal adhesions   Melena  Acute respiratory failure with hypoxia due to complete left lung collapse: Pulmonary was consulted they thought it was due to mucous plug pulmonary recommended no bronchoscopy. She was continued on CPT and forced vital capacity started on hypertonic saline. Repeat a chest x-ray showed bilateral effusions improving and irrigation of the left lung significantly improved. She remained afebrile with no leukocytosis. She is not a candidate for anticoagulation due to high risk of bleeding. We were able to wean her down to room air. She will continue sensi spirometry out of bed  to chair and continue to work with physical therapy. PT OT evaluated the patient she will need skilled nursing facility.  Perforated ulcer with hemorrhage status post  repair with a Arlyss patch on 06/15/2024/acute hemorrhagic shock due to duodenal ulcer with a JP drain in place. She decompensated acutely General Surgery was consulted they performed surgery on 06/15/2024. She continues to bleed so IR was consulted and GDA embolization was done on 06/25/2024. GI was on board and they recommended to repeat an EGD in 4 to 6 weeks postdischarge. She was transition to a dysphagia 3 diet. GI recommended to continue PPI twice a day and Carafate . Her hemoglobin has remained stable since then.  Mechanical fall with a left femoral fracture: Orthopedic surgery was consulted she status post femoral nailing on 06/12/2024. Weightbearing as tolerated. PT OT evaluated the patient will need skilled nursing facility.  Dysphagia: Now on a dysphagia 3 diet will continue as an outpatient.  Obstructive sleep apnea: Untreated she is debilitated with poor abdominal compliance probably contributing to her respiratory failure. She was placed on CPAP and her mentation improved.  Sacral decubitus ulcer stage II: Discontinue Foley. Foley was placed for wound hygiene.  Urinary retention: Now resolved.  Hyperlipidemia: Continue statins.  Acute kidney injury: Likely due to hemorrhagic shock resolved with conservative management.  Gout: Resume allopurinol  as an outpatient.  Hypothyroidism: Continue Synthroid .  Essential hypertension/hypertension: All antihypertensive medication were discontinued she had to be put on midodrine  temporarily which she has been weaned off. Can consider restarting antihypertensive medication outpatient if her blood pressure starts to rise.   Discharge Instructions  Discharge Instructions     Diet - low sodium heart healthy   Complete by: As directed    Discharge wound care:   Complete by: As directed    Per wound care instructions.   Increase activity slowly   Complete by: As directed       Allergies as of 07/10/2024       Reactions    Penicillins Anaphylaxis, Hives, Shortness Of Breath, Swelling   Pt given ceftriaxone  05/27/24 in ED and tolerated   Biphosphate Other (See Comments)   Aching joints   Fluticasone  Other (See Comments)   Headaches        Medication List     STOP taking these medications    senna-docusate 8.6-50 MG tablet Commonly known as: Senokot-S       TAKE these medications    albuterol  108 (90 Base) MCG/ACT inhaler Commonly known as: VENTOLIN  HFA Inhale 2 puffs into the lungs every 6 (six) hours as needed. Wheezing and shortness of breath   allopurinol  300 MG tablet Commonly known as: ZYLOPRIM  Take 300 mg by mouth daily.   Ergocalciferol  50 MCG (2000 UT) Tabs Take 2,000 Units by mouth daily.   fluticasone -salmeterol 500-50 MCG/ACT Aepb Commonly known as: ADVAIR Inhale 1 puff into the lungs in the morning and at bedtime.   gabapentin  300 MG capsule Commonly known as: NEURONTIN  Take 1-2 capsules by mouth at bedtime.   levothyroxine  137 MCG tablet Commonly known as: SYNTHROID  Take 137 mcg by mouth daily.   nystatin  powder Commonly known as: MYCOSTATIN /NYSTOP  Apply topically 3 (three) times daily.   oxyCODONE  5 MG immediate release tablet Commonly known as: Oxy IR/ROXICODONE  Take 1 tablet (5 mg total) by mouth every 4 (four) hours as needed for up to 3 days for severe pain (pain score 7-10).   pantoprazole  40 MG tablet Commonly known as: PROTONIX  Take 1 tablet (40 mg total)  by mouth 2 (two) times daily.   rosuvastatin  10 MG tablet Commonly known as: CRESTOR  Take 10 mg by mouth daily.   sucralfate  1 GM/10ML suspension Commonly known as: CARAFATE  Take 10 mLs (1 g total) by mouth 4 (four) times daily -  with meals and at bedtime.   TYLENOL  500 MG tablet Generic drug: acetaminophen  Take 1,000 mg by mouth every 6 (six) hours.   VITAMIN B-12 CR PO Take 1 tablet by mouth daily.               Discharge Care Instructions  (From admission, onward)            Start     Ordered   07/10/24 0000  Discharge wound care:       Comments: Per wound care instructions.   07/10/24 0758            Contact information for follow-up providers     Pappayliou, Dorothyann A, DO. Call in 3 week(s).   Specialty: General Surgery Contact information: [address] [place] [address] [phone]              Contact information for after-discharge care     Destination     [facility] .   Service: Skilled Nursing Contact information: [address] [place]  [address] [phone]                    Allergies  Allergen Reactions   Penicillins Anaphylaxis, Hives, Shortness Of Breath and Swelling    Pt given ceftriaxone  05/27/24 in ED and tolerated   Biphosphate Other (See Comments)    Aching joints   Fluticasone  Other (See Comments)    Headaches    Consultations: Gastroenterology General Surgery Interventional radiology Orthopedic surgery PCCM   Procedures/Studies: DG CHEST PORT 1 VIEW Result Date: 07/08/2024 CLINICAL DATA:  68 year old female with pleural effusions. Shortness of breath. EXAM: PORTABLE CHEST 1 VIEW COMPARISON:  Portable chest 07/06/2024 and earlier. FINDINGS: Portable AP semi upright view at 0546 hours. Moderate volume left and smaller volume veiling right pleural effusions persist. Dense left lung base opacification, but left upper lung ventilation has improved since 07/05/2024. No pneumothorax. Mediastinal contours chronically shifted to the left, probably from a degree of left lung volume loss. Stable right PICC line. Stable visualized osseous structures. Paucity of bowel gas in the visible abdomen. IMPRESSION: 1. Persistent moderate volume left and smaller volume right pleural effusions. 2. Continued improvement of left upper lung ventilation since 07/05/2024. 3. Stable right PICC line. Electronically Signed   By: VEAR Hurst M.D.   On: 07/08/2024 09:36   DG CHEST PORT 1 VIEW Result  Date: 07/06/2024 CLINICAL DATA:  Shortness of breath EXAM: PORTABLE CHEST 1 VIEW COMPARISON:  July 05, 2024 FINDINGS: Improved aeration of the left lung with persistent opacity at left lung base, likely combination of consolidation and pleural effusion. Small right pleural effusion. Cardiomediastinal silhouette is obscured. IMPRESSION: Improved aeration of the left lung with persistent left lower lung opacity, likely combination of consolidation and pleural effusion. Small right pleural effusion. Electronically Signed   By: Michaeline Blanch M.D.   On: 07/06/2024 11:03   DG Chest Port 1 View Result Date: 07/05/2024 CLINICAL DATA:  Shortness of breath. EXAM: PORTABLE CHEST 1 VIEW COMPARISON:  06/15/2024 FINDINGS: Interval development of whiteout of the left hemithorax with some shift of cardiomediastinal anatomy to the left. Imaging features suggest underlying component of left lung collapse although air bronchograms in the  left lower lung suggest associated consolidative disease. Probable associated left effusion. There is a tiny right pleural effusion. Vascular congestion noted in the right lung. Right PICC line tip overlies the mediastinum. IMPRESSION: 1. Interval development of whiteout of the left hemithorax with some shift of cardiomediastinal anatomy to the left. Imaging features suggest underlying component of left lung collapse although air bronchograms in the left lower lung suggest associated consolidative disease. Probable associated left effusion. 2. Tiny right pleural effusion. 3. Vascular congestion in the right lung. Electronically Signed   By: Camellia Candle M.D.   On: 07/05/2024 08:13   CT ANGIO GI BLEED Result Date: 07/01/2024 CLINICAL DATA:  Evaluate for GI bleed. EXAM: CTA ABDOMEN AND PELVIS WITHOUT AND WITH CONTRAST TECHNIQUE: Multidetector CT imaging of the abdomen and pelvis was performed using the standard protocol during bolus administration of intravenous contrast. Multiplanar reconstructed  images and MIPs were obtained and reviewed to evaluate the vascular anatomy. RADIATION DOSE REDUCTION: This exam was performed according to the departmental dose-optimization program which includes automated exposure control, adjustment of the mA and/or kV according to patient size and/or use of iterative reconstruction technique. CONTRAST:  OMNIPAQUE  IOHEXOL  350 MG/ML SOLN COMPARISON:  CT dated 06/29/2024. FINDINGS: VASCULAR Aorta: Moderate atherosclerotic calcification. No aneurysmal dilatation or dissection. No periaortic fluid collection. Celiac: The celiac artery and its major branches are patent. Coil embolization of the GDA. SMA: Atherosclerotic calcification of the origin of the SMA. The SMA remains patent. Renals: Atherosclerotic calcification of the renal arteries. The renal arteries are patent. Duplicated right renal artery anatomy. IMA: The IMA is patent. Inflow: Mild atherosclerotic calcification. No aneurysmal dilatation or dissection. The iliac arteries are patent. Proximal Outflow: The visualized proximal outflows patent. Veins: The IVC is unremarkable. The SMV, splenic vein, and main portal vein are patent no portal venous gas. Review of the MIP images confirms the above findings. NON-VASCULAR Lower chest: Partially visualized bilateral pleural effusions with associated compressive atelectasis of the adjacent lungs. Pneumonia is not excluded. No intra-abdominal free air.  Trace perihepatic ascites. Hepatobiliary: Slight irregularity of the liver contour may represent changes of cirrhosis. Clinical correlation recommended. Cholecystectomy. No retained calcified stone noted in the central CBD. JP drainage catheter in similar position along the inferior surface of the liver. Trace fluid noted adjacent to the catheter. Pancreas: Unremarkable. No pancreatic ductal dilatation or surrounding inflammatory changes. Spleen: Normal in size without focal abnormality. Adrenals/Urinary Tract: The adrenal  glands unremarkable. An 8 mm nonobstructing left renal upper pole calculus. Duplicated right renal collecting system. Similar appearance of a hypoenhancing area in the inferior pole of the right kidney as the prior CT. This may represent an area of infarct or lobar nephronia/developing abscess. Underlying mass is not excluded. Attention on follow-up imaging recommended. Mild right hydronephrosis. The urinary bladder is decompressed around a Foley catheter. Stomach/Bowel: There is diffuse thickening of the distal stomach, slightly progressed since the prior CT and may represent gastritis. Underlying small gastric ulcer is not excluded. There is contrast in the proximal duodenum which appears relatively unchanged on the precontrast and postcontrast images and may represent retained oral contrast seen within the stomach on the prior CT. No evidence of active GI bleed on today's exam. Postsurgical changes of the bowel with right anterior colostomy. There is sigmoid diverticulosis. Oral contrast noted in the proximal colon extends into the colostomy. No bowel obstruction. Appendectomy. Lymphatic: No adenopathy. Reproductive: Hysterectomy. Other: Large ventral hernia containing portion of the stomach, large and small bowel. Musculoskeletal: Osteopenia  with degenerative changes. Left femoral nail. No acute osseous pathology. Sacral decubitus ulcer. No fluid collection. IMPRESSION: 1. No evidence of active GI bleed on today's exam. 2. Diffuse thickening of the distal stomach, slightly progressed since the prior CT and may represent gastritis. Underlying small gastric ulcer is not excluded. 3. Partially visualized bilateral pleural effusions with associated compressive atelectasis of the adjacent lungs. Pneumonia is not excluded. 4. An 8 mm nonobstructing left renal upper pole calculus. 5.  Aortic Atherosclerosis (ICD10-I70.0). Electronically Signed   By: Vanetta Chou M.D.   On: 07/01/2024 15:52   CT ANGIO GI  BLEED Addendum Date: 07/01/2024 ADDENDUM REPORT: 07/01/2024 15:00 ADDENDUM: Upon further evaluation there is active GI bleed within the duodenum. Electronically Signed   By: Vanetta Chou M.D.   On: 07/01/2024 15:00   Result Date: 07/01/2024 CLINICAL DATA:  Upper GI bleed. EXAM: CTA ABDOMEN AND PELVIS WITHOUT AND WITH CONTRAST TECHNIQUE: Multidetector CT imaging of the abdomen and pelvis was performed using the standard protocol during bolus administration of intravenous contrast. Multiplanar reconstructed images and MIPs were obtained and reviewed to evaluate the vascular anatomy. RADIATION DOSE REDUCTION: This exam was performed according to the departmental dose-optimization program which includes automated exposure control, adjustment of the mA and/or kV according to patient size and/or use of iterative reconstruction technique. CONTRAST:  OMNIPAQUE  IOHEXOL  350 MG/ML SOLN COMPARISON:  CT dated 06/24/2024. FINDINGS: VASCULAR Aorta: Moderate atherosclerotic calcification. No aneurysmal dilatation or dissection. Celiac: The celiac artery, splenic, and left gastric artery branches are patent. Coil embolization of the gastroduodenal artery. SMA: The SMA is patent. Renals: The renal arteries are patent. Duplicated right renal artery anatomy. IMA: Diminished opacification of the IMA. Inflow: Mild atherosclerotic calcification. The iliac arteries are patent. No evidence of dilatation or dissection. Proximal Outflow: The visualized proximal outflow is patent. Veins: The IVC is unremarkable. The SMV, splenic vein, and main portal vein are patent. No portal venous gas. Review of the MIP images confirms the above findings. NON-VASCULAR Lower chest: Partially visualized moderate-sized bilateral pleural effusions with compressive atelectasis of the adjacent lungs. No intra-abdominal free air or free fluid. Hepatobiliary: Mild irregularity of the liver contour may represent early changes of cirrhosis. There is mild  biliary dilatation, post cholecystectomy. No retained calcified stone noted in the central CBD. A drainage catheter noted inferior to the liver. No drainable fluid collection noted along the catheter. Pancreas: Unremarkable. No pancreatic ductal dilatation or surrounding inflammatory changes. Spleen: Normal in size without focal abnormality. Adrenals/Urinary Tract: The adrenal glands are unremarkable. There is an 8 mm nonobstructing left renal upper pole calculus. Duplicated right renal collecting system. A 2.7 x 2.7 cm hypoenhancing area in the inferior pole of the right kidney is new since the prior CT may represent an area of infarct or a lobar nephronia/developing abscess. There is mild right hydronephrosis. The urinary bladder is decompressed around a Foley catheter. Stomach/Bowel: Retained oral contrast noted in the colon. 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 There is sigmoid diverticulosis. There is no bowel obstruction. Evaluation for active GI bleed is very limited due to presence of oral contrast. No extravasated contrast noted to suggest active GI bleed. There is inflammatory changes and thickening of the distal stomach which may represent gastritis or related to a gastric ulcer. Appendectomy. Lymphatic: No adenopathy. Reproductive: Hysterectomy. Other: Large ventral hernia containing portion of the stomach, loops of small bowel and part of colon. There is mild upper mesentery, and periportal edema. Musculoskeletal:  Osteopenia with degenerative changes. No acute osseous pathology. IMPRESSION: 1. No evidence of active GI bleed. 2. Inflammatory changes and thickening of the distal stomach may represent gastritis or related to a gastric ulcer. Sigmoid diverticulosis. No bowel obstruction. 3. Partially visualized moderate-sized bilateral pleural effusions with compressive atelectasis of the adjacent lungs. 4. Large bowel containing ventral hernia.  No bowel obstruction. 5.  A 2.7 x 2.7 cm hypoenhancing area in the inferior pole of the right kidney is new since the prior CT may represent an area of infarct or a lobar nephronia/developing abscess. There is mild right hydronephrosis. 6. An 8 mm nonobstructing left renal upper pole calculus. 7.  Aortic Atherosclerosis (ICD10-I70.0). Electronically Signed: By: Vanetta Chou M.D. On: 06/29/2024 13:34   IR US  Guide Vasc Access Right Result Date: 06/26/2024 INDICATION: Refractory upper GI bleed s/p endoscopy. EXAM: Title; COIL EMBOLIZATION OF GASTRODUODENAL ARTERY Listed procedures; 1.  ULTRASOUND GUIDANCE FOR ARTERIAL ACCESS 2. MESENTERIC ARTERIOGRAPHY, INCLUDING CELIAC, COMMON HEPATIC and GASTRODUODENAL ARTERIOGRAMS 3.  COIL EMBOLIZATION OF GASTRODUODENAL ARTERY COMPARISON:  CTA abdomen pelvis, 06/24/2024 MEDICATIONS: 4 mg Zofran  IV.  50 mg Benadryl  IV. ANESTHESIA/SEDATION: Moderate (conscious) sedation was employed during this procedure. A total of Versed  1.5 mg and Fentanyl  75 mcg was administered intravenously. Moderate Sedation Time: 89 minutes. The patient's level of consciousness and vital signs were monitored continuously by radiology nursing throughout the procedure under my direct supervision. CONTRAST:  100 mL Omnipaque  300 FLUOROSCOPY: Radiation Exposure Index and estimated peak skin dose (PSD); Reference air kerma (RAK), 448.5 mGy. Kerma-area product (KAP), 7499.3 uGy*m. COMPLICATIONS: None immediate. PROCEDURE: Informed consent was obtained from the patient and/or patient's representative following explanation of the procedure, risks, benefits and alternatives. All questions were addressed. A time out was performed prior to the initiation of the procedure. Maximal barrier sterile technique utilized including caps, mask, sterile gowns, sterile gloves, large sterile drape, hand hygiene, and chlorhexidine  prep. The RIGHT femoral head was marked fluoroscopically. Under sterile conditions and local anesthesia, the RIGHT common  femoral artery access was performed with a micropuncture needle. Under direct ultrasound guidance, the RIGHT common femoral was accessed with a micropuncture kit. An ultrasound image was saved for documentation purposes. This allowed for placement of a 5 Fr 35 cm vascular sheath. A limited arteriogram was performed through the side arm of the sheath confirming appropriate access within the RIGHT common femoral artery. A limited abdominal aortogram was performed to help identify the celiac artery origin. Over a Bentson wire, a C2 catheter was advanced, back bled and flushed. The catheter was then utilized to select the celiac access, then advanced into the common hepatic then gastroduodenal arteries. Selective mesenteric arteriograms were performed at each level. Initial coil deployment was performed with the 5 Fr catheter within the gastroduodenal artery, then a 6 mm 0.035 inch Terumo Azure was attempted to be deployed, however due deployment mechanism fraying was noted on reposition attempt. The 5 Fr sheath was exchanged for an 8 Fr 35 cm vascular sheath, then a 6 Fr snare was deployed. The frayed deployment mechanism was ensnared, and the mechanism and coil were removed intact. The C2 catheter was readvanced and utilized to select the celiac axis, and finally back into the GDA. Using a 2.4 Fr microcatheter and 0.016 inch Fathom microwire access into the gastroduodenal artery was performed and a selective arteriogram was performed. Selective embolization with multiple 0.018 inch micro coils was performed. The microcatheter was removed and arteriogram with the 5 Fr catheter at the common hepatic  artery was performed. Adequate pruning of the GDA feeder arteries was achieved on post embolization arteriogram. Images were reviewed and the procedure was terminated. All wires, catheters and sheaths were removed from the patient. Hemostasis was achieved at the RIGHT groin access site with Angio-Seal closure device. The  patient tolerated the procedure well without immediate post procedural complication. FINDINGS: *Celiac and common hepatic arteriograms with normal order and branching. *Gastroduodenal arteriogram without active extravasation noted. *Embolization with adequate pruning of the GDA distribution arteries. *Access via the RIGHT femoral artery. Angio-Seal closure at RIGHT groin. Palpable RLE pulses at the end of the Case IMPRESSION: 1. 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 2. Successful empiric coil embolization of the gastroduodenal artery for refractory upper GI bleed. PLAN: - The patient is to remain flat for 2 hours with RIGHT leg straight. - The patient may continue to experience residual melena however should resolve in the coming days. Thom Hall, MD [facility] Radiology Electronically Signed   By: Thom Hall M.D.   On: 06/26/2024 10:26   IR Angiogram Visceral Selective Result Date: 06/26/2024 INDICATION: Refractory upper GI bleed s/p endoscopy. EXAM: Title; COIL EMBOLIZATION OF GASTRODUODENAL ARTERY Listed procedures; 1.  ULTRASOUND GUIDANCE FOR ARTERIAL ACCESS 2. MESENTERIC ARTERIOGRAPHY, INCLUDING CELIAC, COMMON HEPATIC and GASTRODUODENAL ARTERIOGRAMS 3.  COIL EMBOLIZATION OF GASTRODUODENAL ARTERY COMPARISON:  CTA abdomen pelvis, 06/24/2024 MEDICATIONS: 4 mg Zofran  IV.  50 mg Benadryl  IV. ANESTHESIA/SEDATION: Moderate (conscious) sedation was employed during this procedure. A total of Versed  1.5 mg and Fentanyl  75 mcg was administered intravenously. Moderate Sedation Time: 89 minutes. The patient's level of consciousness and vital signs were monitored continuously by radiology nursing throughout the procedure under my direct supervision. CONTRAST:  100 mL Omnipaque  300 FLUOROSCOPY: Radiation Exposure Index and estimated peak skin dose (PSD); Reference air kerma (RAK), 448.5 mGy. Kerma-area product  (KAP), 7499.3 uGy*m. COMPLICATIONS: None immediate. PROCEDURE: Informed consent was obtained from the patient and/or patient's representative following explanation of the procedure, risks, benefits and alternatives. All questions were addressed. A time out was performed prior to the initiation of the procedure. Maximal barrier sterile technique utilized including caps, mask, sterile gowns, sterile gloves, large sterile drape, hand hygiene, and chlorhexidine  prep. The RIGHT femoral head was marked fluoroscopically. Under sterile conditions and local anesthesia, the RIGHT common femoral artery access was performed with a micropuncture needle. Under direct ultrasound guidance, the RIGHT common femoral was accessed with a micropuncture kit. An ultrasound image was saved for documentation purposes. This allowed for placement of a 5 Fr 35 cm vascular sheath. A limited arteriogram was performed through the side arm of the sheath confirming appropriate access within the RIGHT common femoral artery. A limited abdominal aortogram was performed to help identify the celiac artery origin. Over a Bentson wire, a C2 catheter was advanced, back bled and flushed. The catheter was then utilized to select the celiac access, then advanced into the common hepatic then gastroduodenal arteries. Selective mesenteric arteriograms were performed at each level. Initial coil deployment was performed with the 5 Fr catheter within the gastroduodenal artery, then a 6 mm 0.035 inch Terumo Azure was attempted to be deployed, however due deployment mechanism fraying was noted on reposition attempt. The 5 Fr sheath was exchanged for an 8 Fr 35 cm vascular sheath, then a 6 Fr snare was deployed. The frayed deployment mechanism was ensnared, and the mechanism and coil were removed intact. The C2 catheter was readvanced and utilized to  select the celiac axis, and finally back into the GDA. Using a 2.4 Fr microcatheter and 0.016 inch Fathom microwire  access into the gastroduodenal artery was performed and a selective arteriogram was performed. Selective embolization with multiple 0.018 inch micro coils was performed. The microcatheter was removed and arteriogram with the 5 Fr catheter at the common hepatic artery was performed. Adequate pruning of the GDA feeder arteries was achieved on post embolization arteriogram. Images were reviewed and the procedure was terminated. All wires, catheters and sheaths were removed from the patient. Hemostasis was achieved at the RIGHT groin access site with Angio-Seal closure device. The patient tolerated the procedure well without immediate post procedural complication. FINDINGS: *Celiac and common hepatic arteriograms with normal order and branching. *Gastroduodenal arteriogram without active extravasation noted. *Embolization with adequate pruning of the GDA distribution arteries. *Access via the RIGHT femoral artery. Angio-Seal closure at RIGHT groin. Palpable RLE pulses at the end of the Case IMPRESSION: 1. 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 2. Successful empiric coil embolization of the gastroduodenal artery for refractory upper GI bleed. PLAN: - The patient is to remain flat for 2 hours with RIGHT leg straight. - The patient may continue to experience residual melena however should resolve in the coming days. Thom Hall, MD [facility] Radiology Electronically Signed   By: Thom Hall M.D.   On: 06/26/2024 10:26   IR Angiogram Visceral Selective Result Date: 06/26/2024 INDICATION: Refractory upper GI bleed s/p endoscopy. EXAM: Title; COIL EMBOLIZATION OF GASTRODUODENAL ARTERY Listed procedures; 1.  ULTRASOUND GUIDANCE FOR ARTERIAL ACCESS 2. MESENTERIC ARTERIOGRAPHY, INCLUDING CELIAC, COMMON HEPATIC and GASTRODUODENAL ARTERIOGRAMS 3.  COIL EMBOLIZATION OF GASTRODUODENAL ARTERY COMPARISON:  CTA abdomen pelvis,  06/24/2024 MEDICATIONS: 4 mg Zofran  IV.  50 mg Benadryl  IV. ANESTHESIA/SEDATION: Moderate (conscious) sedation was employed during this procedure. A total of Versed  1.5 mg and Fentanyl  75 mcg was administered intravenously. Moderate Sedation Time: 89 minutes. The patient's level of consciousness and vital signs were monitored continuously by radiology nursing throughout the procedure under my direct supervision. CONTRAST:  100 mL Omnipaque  300 FLUOROSCOPY: Radiation Exposure Index and estimated peak skin dose (PSD); Reference air kerma (RAK), 448.5 mGy. Kerma-area product (KAP), 7499.3 uGy*m. COMPLICATIONS: None immediate. PROCEDURE: Informed consent was obtained from the patient and/or patient's representative following explanation of the procedure, risks, benefits and alternatives. All questions were addressed. A time out was performed prior to the initiation of the procedure. Maximal barrier sterile technique utilized including caps, mask, sterile gowns, sterile gloves, large sterile drape, hand hygiene, and chlorhexidine  prep. The RIGHT femoral head was marked fluoroscopically. Under sterile conditions and local anesthesia, the RIGHT common femoral artery access was performed with a micropuncture needle. Under direct ultrasound guidance, the RIGHT common femoral was accessed with a micropuncture kit. An ultrasound image was saved for documentation purposes. This allowed for placement of a 5 Fr 35 cm vascular sheath. A limited arteriogram was performed through the side arm of the sheath confirming appropriate access within the RIGHT common femoral artery. A limited abdominal aortogram was performed to help identify the celiac artery origin. Over a Bentson wire, a C2 catheter was advanced, back bled and flushed. The catheter was then utilized to select the celiac access, then advanced into the common hepatic then gastroduodenal arteries. Selective mesenteric arteriograms were performed at each level. Initial coil  deployment was performed with the 5 Fr catheter within the gastroduodenal artery, then a 6 mm 0.035 inch Terumo  Azure was attempted to be deployed, however due deployment mechanism fraying was noted on reposition attempt. The 5 Fr sheath was exchanged for an 8 Fr 35 cm vascular sheath, then a 6 Fr snare was deployed. The frayed deployment mechanism was ensnared, and the mechanism and coil were removed intact. The C2 catheter was readvanced and utilized to select the celiac axis, and finally back into the GDA. Using a 2.4 Fr microcatheter and 0.016 inch Fathom microwire access into the gastroduodenal artery was performed and a selective arteriogram was performed. Selective embolization with multiple 0.018 inch micro coils was performed. The microcatheter was removed and arteriogram with the 5 Fr catheter at the common hepatic artery was performed. Adequate pruning of the GDA feeder arteries was achieved on post embolization arteriogram. Images were reviewed and the procedure was terminated. All wires, catheters and sheaths were removed from the patient. Hemostasis was achieved at the RIGHT groin access site with Angio-Seal closure device. The patient tolerated the procedure well without immediate post procedural complication. FINDINGS: *Celiac and common hepatic arteriograms with normal order and branching. *Gastroduodenal arteriogram without active extravasation noted. *Embolization with adequate pruning of the GDA distribution arteries. *Access via the RIGHT femoral artery. Angio-Seal closure at RIGHT groin. Palpable RLE pulses at the end of the Case IMPRESSION: 1. 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 2. Successful empiric coil embolization of the gastroduodenal artery for refractory upper GI bleed. PLAN: - The patient is to remain flat for 2 hours with RIGHT leg straight. - The patient may continue to experience residual melena however should resolve in  the coming days. Thom Hall, MD [facility] Radiology Electronically Signed   By: Thom Hall M.D.   On: 06/26/2024 10:26   IR Angiogram Visceral Selective Result Date: 06/26/2024 INDICATION: Refractory upper GI bleed s/p endoscopy. EXAM: Title; COIL EMBOLIZATION OF GASTRODUODENAL ARTERY Listed procedures; 1.  ULTRASOUND GUIDANCE FOR ARTERIAL ACCESS 2. MESENTERIC ARTERIOGRAPHY, INCLUDING CELIAC, COMMON HEPATIC and GASTRODUODENAL ARTERIOGRAMS 3.  COIL EMBOLIZATION OF GASTRODUODENAL ARTERY COMPARISON:  CTA abdomen pelvis, 06/24/2024 MEDICATIONS: 4 mg Zofran  IV.  50 mg Benadryl  IV. ANESTHESIA/SEDATION: Moderate (conscious) sedation was employed during this procedure. A total of Versed  1.5 mg and Fentanyl  75 mcg was administered intravenously. Moderate Sedation Time: 89 minutes. The patient's level of consciousness and vital signs were monitored continuously by radiology nursing throughout the procedure under my direct supervision. CONTRAST:  100 mL Omnipaque  300 FLUOROSCOPY: Radiation Exposure Index and estimated peak skin dose (PSD); Reference air kerma (RAK), 448.5 mGy. Kerma-area product (KAP), 7499.3 uGy*m. COMPLICATIONS: None immediate. PROCEDURE: Informed consent was obtained from the patient and/or patient's representative following explanation of the procedure, risks, benefits and alternatives. All questions were addressed. A time out was performed prior to the initiation of the procedure. Maximal barrier sterile technique utilized including caps, mask, sterile gowns, sterile gloves, large sterile drape, hand hygiene, and chlorhexidine  prep. The RIGHT femoral head was marked fluoroscopically. Under sterile conditions and local anesthesia, the RIGHT common femoral artery access was performed with a micropuncture needle. Under direct ultrasound guidance, the RIGHT common femoral was accessed with a micropuncture kit. An ultrasound image was saved for  documentation purposes. This allowed for placement of a 5 Fr 35 cm vascular sheath. A limited arteriogram was performed through the side arm of the sheath confirming appropriate access within the RIGHT common femoral artery. A limited abdominal aortogram was performed to help identify the celiac artery origin. Over a Bentson  wire, a C2 catheter was advanced, back bled and flushed. The catheter was then utilized to select the celiac access, then advanced into the common hepatic then gastroduodenal arteries. Selective mesenteric arteriograms were performed at each level. Initial coil deployment was performed with the 5 Fr catheter within the gastroduodenal artery, then a 6 mm 0.035 inch Terumo Azure was attempted to be deployed, however due deployment mechanism fraying was noted on reposition attempt. The 5 Fr sheath was exchanged for an 8 Fr 35 cm vascular sheath, then a 6 Fr snare was deployed. The frayed deployment mechanism was ensnared, and the mechanism and coil were removed intact. The C2 catheter was readvanced and utilized to select the celiac axis, and finally back into the GDA. Using a 2.4 Fr microcatheter and 0.016 inch Fathom microwire access into the gastroduodenal artery was performed and a selective arteriogram was performed. Selective embolization with multiple 0.018 inch micro coils was performed. The microcatheter was removed and arteriogram with the 5 Fr catheter at the common hepatic artery was performed. Adequate pruning of the GDA feeder arteries was achieved on post embolization arteriogram. Images were reviewed and the procedure was terminated. All wires, catheters and sheaths were removed from the patient. Hemostasis was achieved at the RIGHT groin access site with Angio-Seal closure device. The patient tolerated the procedure well without immediate post procedural complication. FINDINGS: *Celiac and common hepatic arteriograms with normal order and branching. *Gastroduodenal arteriogram  without active extravasation noted. *Embolization with adequate pruning of the GDA distribution arteries. *Access via the RIGHT femoral artery. Angio-Seal closure at RIGHT groin. Palpable RLE pulses at the end of the Case IMPRESSION: 1. 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 2. Successful empiric coil embolization of the gastroduodenal artery for refractory upper GI bleed. PLAN: - The patient is to remain flat for 2 hours with RIGHT leg straight. - The patient may continue to experience residual melena however should resolve in the coming days. Thom Hall, MD [facility] Radiology Electronically Signed   By: Thom Hall M.D.   On: 06/26/2024 10:26   IR EMBO ART  VEN HEMORR LYMPH EXTRAV  INC GUIDE ROADMAPPING Result Date: 06/26/2024 INDICATION: Refractory upper GI bleed s/p endoscopy. EXAM: Title; COIL EMBOLIZATION OF GASTRODUODENAL ARTERY Listed procedures; 1.  ULTRASOUND GUIDANCE FOR ARTERIAL ACCESS 2. MESENTERIC ARTERIOGRAPHY, INCLUDING CELIAC, COMMON HEPATIC and GASTRODUODENAL ARTERIOGRAMS 3.  COIL EMBOLIZATION OF GASTRODUODENAL ARTERY COMPARISON:  CTA abdomen pelvis, 06/24/2024 MEDICATIONS: 4 mg Zofran  IV.  50 mg Benadryl  IV. ANESTHESIA/SEDATION: Moderate (conscious) sedation was employed during this procedure. A total of Versed  1.5 mg and Fentanyl  75 mcg was administered intravenously. Moderate Sedation Time: 89 minutes. The patient's level of consciousness and vital signs were monitored continuously by radiology nursing throughout the procedure under my direct supervision. CONTRAST:  100 mL Omnipaque  300 FLUOROSCOPY: Radiation Exposure Index and estimated peak skin dose (PSD); Reference air kerma (RAK), 448.5 mGy. Kerma-area product (KAP), 7499.3 uGy*m. COMPLICATIONS: None immediate. PROCEDURE: Informed consent was obtained from the patient and/or patient's representative following explanation of the  procedure, risks, benefits and alternatives. All questions were addressed. A time out was performed prior to the initiation of the procedure. Maximal barrier sterile technique utilized including caps, mask, sterile gowns, sterile gloves, large sterile drape, hand hygiene, and chlorhexidine  prep. The RIGHT femoral head was marked fluoroscopically. Under sterile conditions and local anesthesia, the RIGHT common femoral artery access was performed with a micropuncture needle. Under direct ultrasound guidance, the  RIGHT common femoral was accessed with a micropuncture kit. An ultrasound image was saved for documentation purposes. This allowed for placement of a 5 Fr 35 cm vascular sheath. A limited arteriogram was performed through the side arm of the sheath confirming appropriate access within the RIGHT common femoral artery. A limited abdominal aortogram was performed to help identify the celiac artery origin. Over a Bentson wire, a C2 catheter was advanced, back bled and flushed. The catheter was then utilized to select the celiac access, then advanced into the common hepatic then gastroduodenal arteries. Selective mesenteric arteriograms were performed at each level. Initial coil deployment was performed with the 5 Fr catheter within the gastroduodenal artery, then a 6 mm 0.035 inch Terumo Azure was attempted to be deployed, however due deployment mechanism fraying was noted on reposition attempt. The 5 Fr sheath was exchanged for an 8 Fr 35 cm vascular sheath, then a 6 Fr snare was deployed. The frayed deployment mechanism was ensnared, and the mechanism and coil were removed intact. The C2 catheter was readvanced and utilized to select the celiac axis, and finally back into the GDA. Using a 2.4 Fr microcatheter and 0.016 inch Fathom microwire access into the gastroduodenal artery was performed and a selective arteriogram was performed. Selective embolization with multiple 0.018 inch micro coils was performed. The  microcatheter was removed and arteriogram with the 5 Fr catheter at the common hepatic artery was performed. Adequate pruning of the GDA feeder arteries was achieved on post embolization arteriogram. Images were reviewed and the procedure was terminated. All wires, catheters and sheaths were removed from the patient. Hemostasis was achieved at the RIGHT groin access site with Angio-Seal closure device. The patient tolerated the procedure well without immediate post procedural complication. FINDINGS: *Celiac and common hepatic arteriograms with normal order and branching. *Gastroduodenal arteriogram without active extravasation noted. *Embolization with adequate pruning of the GDA distribution arteries. *Access via the RIGHT femoral artery. Angio-Seal closure at RIGHT groin. Palpable RLE pulses at the end of the Case IMPRESSION: 1. 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 2. Successful empiric coil embolization of the gastroduodenal artery for refractory upper GI bleed. PLAN: - The patient is to remain flat for 2 hours with RIGHT leg straight. - The patient may continue to experience residual melena however should resolve in the coming days. Thom Hall, MD [facility] Radiology Electronically Signed   By: Thom Hall M.D.   On: 06/26/2024 10:26   CT ANGIO GI BLEED Result Date: 06/24/2024 CLINICAL DATA:  Melena with acute blood-loss and anemia. Status post exploratory laparotomy and Arlyss patch placement for duodenitis and duodenal ulcer with perforation on 06/15/2024. Clinical concern for determining if there is active gastrointestinal bleeding. EXAM: CTA ABDOMEN AND PELVIS WITHOUT AND WITH CONTRAST TECHNIQUE: Multidetector CT imaging of the abdomen and pelvis was performed using the standard protocol during bolus administration of intravenous contrast. Multiplanar reconstructed images and MIPs were obtained  and reviewed to evaluate the vascular anatomy. RADIATION DOSE REDUCTION: This exam was performed according to the departmental dose-optimization program which includes automated exposure control, adjustment of the mA and/or kV according to patient size and/or use of iterative reconstruction technique. CONTRAST:  OMNIPAQUE  IOHEXOL  350 MG/ML SOLN COMPARISON:  Abdomen and pelvis CT dated 06/15/2024. FINDINGS: VASCULAR Aorta: Atheromatous changes without stenosis, aneurysm or dissection. Celiac: Patent without evidence of aneurysm, dissection, vasculitis or significant stenosis. SMA: Patent without evidence of aneurysm, dissection, vasculitis or significant stenosis.  Renals: 2 right renal arteries and 1 left renal artery. There is some plaque formation in the proximal, upper right renal artery producing approximately 50% stenosis. No plaque formation or stenosis involving the more inferior right renal artery. Calcified plaque at the origin of the left renal artery with a proximally 70% stenosis. IMA: Calcified plaque throughout the inferior aspect of the abdominal aorta without opacification of the inferior mesenteric artery. Inflow: Bilateral calcified plaque formation without aneurysm or significant stenosis. Proximal Outflow: Bilateral calcified plaque formation without aneurysm or significant stenosis. Veins: No obvious venous abnormality within the limitations of this arterial phase study. Review of the MIP images confirms the above findings. NON-VASCULAR Lower chest: Moderate-sized right pleural effusion and small to moderate-sized left pleural effusion. Associated bilateral lower lobe compressive atelectasis. The included portion of the heart is mildly enlarged with no pericardial effusion seen. Hepatobiliary: No focal liver abnormality is seen. Status post cholecystectomy. No biliary dilatation. Pancreas: Unremarkable. No pancreatic ductal dilatation or surrounding inflammatory changes. Spleen: Normal in  size without focal abnormality. Adrenals/Urinary Tract: Foley catheter in the urinary bladder. Otherwise, unremarkable bladder and adrenal glands. Simple appearing exophytic cyst arising from the left kidney, not needing imaging follow-up. 7 mm calculus in the upper left renal pelvis. Interval minimal dilatation of the left renal collecting system to the level of the ureteropelvic junction with no obstructing stone or mass seen. No ureteral dilatation. Stomach/Bowel: Interval diffuse low-density gastric wall thickening with mucosal enhancement, most pronounced involving the gastric antrum. Increased high density material in the 2nd portion of the duodenum on the initial postcontrast images, unchanged on the postcontrast images. No active contrast extravasation seen in the stomach or bowel. Status post partial colectomy including the proximal sigmoid colon. Enteric contrast in the excluded distal sigmoid colon and rectum, unchanged on the precontrast and postcontrast images. Multiple sigmoid colon diverticula in that region without evidence of diverticulitis. Unremarkable small bowel. Surgically absent appendix. 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 Inferior to the ostomy, large ventral hernia containing herniated stomach, colon and small bowel is again demonstrated. No evidence of obstruction. Lymphatic: No enlarged lymph nodes. Reproductive: Status post hysterectomy. No adnexal masses. Other: Stable large ventral hernia containing herniated stomach, small bowel and colon without obstruction. An upper abdominal surgical drain is in place with no adjacent fluid. Musculoskeletal: Extensive lumbar and thoracic spine degenerative changes. IMPRESSION: 1. No active contrast extravasation seen in the stomach or bowel. 2. Interval diffuse low-density gastric wall thickening with mucosal enhancement, most pronounced involving the gastric antrum. This is consistent with gastritis. 3. Moderate-sized right pleural  effusion and small to moderate-sized left pleural effusion with associated bilateral lower lobe compressive atelectasis. 4. Mild cardiomegaly. 5. 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 6. 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 7. Interval minimal dilatation of the left renal collecting system to the level of the ureteropelvic junction with no obstructing stone or mass seen. This could be due to a recently passed stone or a UPJ obstruction. 8. Stable large ventral hernia containing herniated stomach, small bowel and colon without obstruction. 9. Status post partial colectomy including the proximal sigmoid colon. Enteric contrast in the excluded distal sigmoid colon and rectum, unchanged on the precontrast and postcontrast images. Multiple sigmoid colon diverticula in that region without evidence of diverticulitis. 10. 7 mm nonobstructing calculus in the upper left renal pelvis. 11. Aortic atherosclerosis. Aortic Atherosclerosis (ICD10-I70.0). Electronically Signed  By: Elspeth Bathe M.D.   On: 06/24/2024 15:00   DG UGI W SINGLE CM (SOL OR THIN BA) Result Date: 06/18/2024 CLINICAL DATA:  Provided history: Perforated ulcer. Additional history obtained from electronic MEDICAL RECORD NUMBERStatus post Arlyss patch repair of perforated proximal duodenal ulcer. EXAM: UPPER GI SERIES WITH KUB TECHNIQUE: A scout radiograph was acquired. Subsequently, a problem-oriented water -soluble contrast upper GI series was performed to assess for contrast leak status post Arlyss patch repair of a perforated proximal duodenal ulcer. The exam was performed by [place], NP, and was supervised and interpreted by Dr. Rockey Childs. FLUOROSCOPY: FLUOROSCOPY Radiation Exposure Index (as provided by the fluoroscopic device): 27.60 mGy Kerma COMPARISON:  CT angiogram of the abdomen/pelvis with bilateral runoff 06/15/2024. FINDINGS: A scout  radiograph of the abdomen was acquired. Nasogastric tube terminating in the stomach. Abdominal surgical drain. Overlying skin staples. Nonobstructive bowel gas pattern within the visible abdomen. Subsequently under intermittent fluoroscopy, contrast was hand injected via the patient's nasogastric tube. Contrast opacified the stomach and promptly passed into the proximal small bowel. Irregular contrast collection at the level the gastric antrum/proximal duodenum, likely reflecting the ulcer/surgical site. No evidence of contrast leak. IMPRESSION: 1. Problem-oriented water -soluble contrast upper GI series performed to assess for contrast leak status post Arlyss patch repair of a perforated proximal duodenal ulcer. 2. Irregular contrast collection at the level of the gastric antrum/proximal duodenum likely reflecting the ulcer/surgical site. No evidence of contrast leak. Electronically Signed   By: Rockey Childs D.O.   On: 06/18/2024 11:20   US  EKG SITE RITE Result Date: 06/18/2024 If Site Rite image not attached, placement could not be confirmed due to current cardiac rhythm.  DG Chest Port 1 View Result Date: 06/15/2024 CLINICAL DATA:  Endotracheal tube EXAM: PORTABLE CHEST 1 VIEW COMPARISON:  Chest x-ray 06/15/2024 FINDINGS: Right-sided central venous catheter tip projects over the mid SVC. Endotracheal tube tip is 3.5 cm above the carina. Enteric tube extends below the diaphragm. There are left basilar airspace opacities. Costophrenic angles are now clear. There is no pneumothorax. Cardiomediastinal silhouette is stable, the heart is mildly enlarged. Osseous structures are unchanged. IMPRESSION: 1. Left basilar airspace opacities, atelectasis versus pneumonia. 2. Support apparatus as above. Electronically Signed   By: Greig Pique M.D.   On: 06/15/2024 20:00   DG Chest Port 1 View Result Date: 06/15/2024 CLINICAL DATA:  Endotracheal tube EXAM: PORTABLE CHEST 1 VIEW COMPARISON:  Chest x-ray 06/15/2024  FINDINGS: The endotracheal tube tip is 2 cm above the carina. Right-sided central venous catheter tip projects over the distal SVC. Enteric tube extends below the diaphragm. A small left pleural effusion is present with some patchy left basilar opacities. The right lung is clear. There is no pneumothorax or acute fracture. IMPRESSION: 1. Endotracheal tube tip 2 cm above the carina. 2. Small left pleural effusion with patchy left basilar opacities, atelectasis versus pneumonia. Electronically Signed   By: Greig Pique M.D.   On: 06/15/2024 16:54   CT ANGIO AO+BIFEM W & OR WO CONTRAST Result Date: 06/15/2024 CLINICAL DATA:  Hemorrhagic shock.  Evaluate for site of bleeding. EXAM: CT ANGIOGRAPHY OF ABDOMINAL AORTA WITH ILIOFEMORAL RUNOFF TECHNIQUE: Multidetector CT imaging of the abdomen, pelvis and lower extremities was performed using the standard protocol during bolus administration of intravenous contrast. Multiplanar CT image reconstructions and MIPs were obtained to evaluate the vascular anatomy. RADIATION DOSE REDUCTION: This exam was performed according to the departmental dose-optimization program which includes automated exposure control, adjustment of the  mA and/or kV according to patient size and/or use of iterative reconstruction technique. CONTRAST:  OMNIPAQUE  IOHEXOL  300 MG/ML  SOLN COMPARISON:  Prior renal ultrasound 06/08/2024 FINDINGS: VASCULAR Aorta: Normal caliber aorta without aneurysm, dissection, vasculitis or significant stenosis. Scattered calcified atherosclerotic plaque. Celiac: Patent without evidence of aneurysm, dissection, vasculitis or significant stenosis. SMA: Patent without evidence of aneurysm, dissection, vasculitis or significant stenosis. Renals: 2 right-sided renal arteries. Single left renal artery. Calcified atherosclerotic plaque bilaterally with at least moderate stenosis on the left and mild to moderate stenosis on the right. IMA: Atretic, likely chronic high-grade  stenosis or occlusion of the origin. RIGHT Lower Extremity Inflow: Scattered atherosclerotic plaque without significant stenosis or occlusion. No aneurysm or dissection. Outflow: Similarly, mild scattered atherosclerotic plaque without significant stenosis or occlusion involving the common femoral, superficial femoral or profunda femoral arteries. The popliteal artery is widely patent. Runoff: High origin of the anterior tibial artery arising from the proximal P3 segment of the popliteal artery at the level of the knee joint. Patent 3 vessel runoff to the ankle. LEFT Lower Extremity Inflow: Mild scattered calcified atherosclerotic plaque without significant stenosis or occlusion. No aneurysm or dissection. Outflow: Common femoral, profunda femoral and superficial femoral arteries are widely patent. No evidence of active bleeding. Runoff: High origin of the anterior tibial artery arising from the P2 segment of the popliteal artery at the level of the lower pole of the patella. Patent 3 vessel runoff to the ankle. No evidence of active bleeding. Veins: No focal venous abnormality. Review of the MIP images confirms the above findings. NON-VASCULAR Lower chest: Small bilateral pleural effusions and associated bibasilar atelectasis. Hepatobiliary: Normal hepatic contour and morphology. No discrete hepatic lesion. Surgical changes of cholecystectomy. No evidence of biliary ductal dilatation. Pancreas: Unremarkable. No pancreatic ductal dilatation or surrounding inflammatory changes. Spleen: Normal in size without focal abnormality. Adrenals/Urinary Tract: Unremarkable adrenal glands. No hydronephrosis or enhancing renal mass. 1.9 cm water  attenuation simple cyst exophytic from the interpolar right kidney. No imaging follow-up is recommended. 9 mm nonobstructing stone in the interpolar left renal collecting system. The ureters are unremarkable. A Foley catheter is in place. Stomach/Bowel: Limited evaluation for GI bleeding  given scan technique. There is no pre contrast imaging and no delayed imaging. Scattered high attenuation material is present throughout the distal bowel consistent with prior barium administration. There is a small amount of high attenuation material layering dependently within the stomach as well as within the gastric antrum and duodenal bulb. However, there is intermediate attenuation density expanding the distal gastric antrum, duodenal bulb and proximal duodenum highly concerning for hematoma. Additionally, some high attenuation material is present within this presumed hematoma which does not appear to be layering dependently and is concerning for a focus of active arterial extravasation. The gastric antral and proximal duodenal wall are thickened and there is inflammatory stranding in the periduodenal fat. Additionally, several small bubbles are present immediately adjacent to the duodenal bulb. Findings suggest possible perforated peptic ulcer disease with probable bleeding. 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 Lymphatic: No suspicious lymphadenopathy. Reproductive: Status post hysterectomy. No adnexal masses. Other: Large midline ventral hernia containing the anterior body of the stomach, portions of the ascending colon and numerous loops of small bowel. Additionally, there is a moderate volume of free air scattered throughout abdomen. Musculoskeletal: Extensive multilevel degenerative disc disease. Comminuted distal left femoral fracture status post ORIF with an intramedullary nail. There are 2 proximal  and 3 distal interlocking screws. The distal interlocking screws expected small volume hematoma surrounding the comminuted fracture site. No evidence of arterial injury or arterial bleeding. Multilevel degenerative disc disease is present. Bilateral lower lumbar facet arthropathy also noted. IMPRESSION: 1. Overall CT  findings are highly concerning for perforated and bleeding peptic ulcer disease affecting the proximal duodenum, potentially stress related peptic ulcer disease. Additionally, while the study is not optimized to detect GI bleeding (no precontrast or delayed phase imaging) there appears to be hematoma within the gastric antrum and proximal duodenum with some scattered high attenuation material which may represent active extravasation versus ingested oral contrast (there is evidence of oral contrast elsewhere in the bowel). Hemorrhage is favored given the clinical history and the evidence of perforated ulceration. Scattered free air throughout the upper abdomen and within the ventral hernia sac. 2. Large ventral abdominal hernia containing the gastric body, a portion of the ascending colon and multiple loops of small bowel as well as a moderate volume of free air. 3. No evidence of significant hemorrhage related to the comminuted distal femoral fracture or recent ORIF. There is a small amount of expected Peri fracture hematoma but no evidence of arterial injury or active bleeding. 4. Extensive scattered calcified atherosclerotic plaque as detailed above. 5. Bilateral high origins of the anterior tibial arteries. 6. Small bilateral pleural effusions and associated atelectasis. 7. Surgical changes of prior left hemicolectomy with Hartmann's pouch and diverting right lower quadrant colostomy. 8. Surgical changes of prior cholecystectomy. 9. Advanced multilevel degenerative disc disease and lower lumbar facet arthropathy. Critical Value/emergent results were called by telephone at the time of interpretation on 06/15/2024 at 10:40 am to provider [facility] , who verbally acknowledged these results. Electronically Signed   By: Wilkie Lent M.D.   On: 06/15/2024 10:52   DG Chest Port 1 View Result Date: 06/15/2024 EXAM: 1 VIEW XRAY OF THE CHEST 06/15/2024 06:03:00 AM COMPARISON: 1 view chest x-ray dated  06/07/2024. CLINICAL HISTORY: [id number] S/P PICC central line placement [id number]. PICC central line plcmt S/P PICC central line placement PICC central line plcmt FINDINGS: LUNGS AND PLEURA: Asymmetric left basilar airspace disease and effusion is again noted. HEART AND MEDIASTINUM: No acute abnormality of the cardiac and mediastinal silhouettes. BONES AND SOFT TISSUES: No acute osseous abnormality. LINES AND TUBES: A new right IJ line terminates at the cavoatrial junction. No pneumothorax is present. IMPRESSION: 1. New right IJ line terminates at the cavoatrial junction. 2. Asymmetric left basilar airspace disease and effusion, again noted. Electronically signed by: Lonni Necessary MD 06/15/2024 06:18 AM EDT RP Workstation: [id number]   DG Abd 1 View Result Date: 06/15/2024 CLINICAL DATA:  Unspecified abdominal pain EXAM: ABDOMEN - 1 VIEW COMPARISON:  None Available. FINDINGS: The bowel gas pattern is normal. No radio-opaque calculi or other significant radiographic abnormality are seen. Mild gaseous distension of the stomach. Nonobstructive bowel gas pattern. Moderate stool burden. No free intraperitoneal gas. Rounded 6 mm calcification within the left paraspinal region may represent a renal calculus. Coarse calcification within the left lower quadrant is nonspecific, possibly the sequela remote trauma or inflammation. Numerous surgical clips seen within the epigastrium and left lower quadrant. Left femoral ORIF partially visualized. No acute bone abnormality. IMPRESSION: 1. Nonobstructive bowel gas pattern. Moderate stool burden. 2. Possible 6 mm left renal calculus. Electronically Signed   By: Dorethia Molt M.D.   On: 06/15/2024 03:03   DG FEMUR MIN 2 VIEWS LEFT Result Date: 06/12/2024 CLINICAL DATA:  Left femur fracture  EXAM: LEFT FEMUR 2 VIEWS COMPARISON:  06/07/2024 FINDINGS: Eight fluoroscopic images are obtained during the performance of the procedure and are provided for interpretation only.  Intramedullary rod with proximal and distal interlocking screws traverse a comminuted distal left femoral metadiaphyseal junction fracture, with near anatomic alignment on the final images. Please refer to the operative report. Fluoroscopy time: 2 minute 28 seconds, 30.71 mGy IMPRESSION: 1. ORIF left femur fracture as above. Electronically Signed   By: Ozell Daring M.D.   On: 06/12/2024 18:54   DG FEMUR MIN 2 VIEWS LEFT Result Date: 06/12/2024 CLINICAL DATA:  Intramedullary nail fixation EXAM: LEFT FEMUR 2 VIEWS COMPARISON:  None Available. FINDINGS: Intramedullary nail fixation of comminuted fracture of the distal femur. Three distal locking screws. IMPRESSION: Internal fixation of distal femur fracture Electronically Signed   By: Jackquline Boxer M.D.   On: 06/12/2024 18:25   DG C-Arm 1-60 Min-No Report Result Date: 06/12/2024 Fluoroscopy was utilized by the requesting physician.  No radiographic interpretation.   DG C-Arm 1-60 Min-No Report Result Date: 06/12/2024 Fluoroscopy was utilized by the requesting physician.  No radiographic interpretation.     Subjective: No complaints  Discharge Exam: Vitals:   07/10/24 0748 07/10/24 0749  BP:    Pulse: 67   Resp: 18   Temp:    SpO2: 98% 98%   Vitals:   07/10/24 0500 07/10/24 0744 07/10/24 0748 07/10/24 0749  BP:  (!) 140/69    Pulse:  (!) 108 67   Resp:  16 18   Temp:  98 F (36.7 C)    TempSrc:  Oral    SpO2:  97% 98% 98%  Weight: 105 kg     Height:        General: Pt is alert, awake, not in acute distress Cardiovascular: RRR, S1/S2 +, no rubs, no gallops Respiratory: CTA bilaterally, no wheezing, no rhonchi Abdominal: Soft, NT, ND, bowel sounds + Extremities: no edema, no cyanosis    The results of significant diagnostics from this hospitalization (including imaging, microbiology, ancillary and laboratory) are listed below for reference.     Microbiology: No results found for this or any previous visit (from the  past 240 hours).   Labs: BNP (last 3 results) Recent Labs    06/07/24 1710  BNP 36.0   Basic Metabolic Panel: Recent Labs  Lab 07/04/24 0351 07/05/24 0358 07/06/24 0331 07/07/24 0235 07/08/24 0520  NA 133* 135 136 136 135  K 3.2* 4.3 3.8 4.0 3.9  CL 99 104 97* 99 95*  CO2 28 27 31 31  32  GLUCOSE 76 77 77 81 77  BUN 12 12 9 13 11   CREATININE 0.76 0.82 0.87 0.79 0.80  CALCIUM  7.8* 8.1* 8.2* 8.1* 8.3*  MG 1.9 2.1 2.0 1.9  --   PHOS 2.6 2.2* 3.5 2.6  --    Liver Function Tests: No results for input(s): AST, ALT, ALKPHOS, BILITOT, PROT, ALBUMIN  in the last 168 hours. No results for input(s): LIPASE, AMYLASE in the last 168 hours. No results for input(s): AMMONIA in the last 168 hours. CBC: Recent Labs  Lab 07/06/24 0331 07/07/24 0235 07/08/24 0520 07/09/24 0338 07/10/24 0306  WBC 6.6 6.5 5.3 6.3 5.8  NEUTROABS 4.6 4.2 3.4 3.8 3.7  HGB 10.0* 10.3* 10.7* 10.8* 10.5*  HCT 31.1* 32.9* 34.0* 33.9* 33.2*  MCV 95.4 98.2 97.1 96.3 97.1  PLT 236 250 249 256 240   Cardiac Enzymes: No results for input(s): CKTOTAL, CKMB, CKMBINDEX, TROPONINI in the last  168 hours. BNP: Invalid input(s): POCBNP CBG: Recent Labs  Lab 07/09/24 0623 07/09/24 1156 07/09/24 1659 07/09/24 2139 07/10/24 0536  GLUCAP 70 97 75 93 74   D-Dimer No results for input(s): DDIMER in the last 72 hours. Hgb A1c No results for input(s): HGBA1C in the last 72 hours. Lipid Profile No results for input(s): CHOL, HDL, LDLCALC, TRIG, CHOLHDL, LDLDIRECT in the last 72 hours. Thyroid  function studies No results for input(s): TSH, T4TOTAL, T3FREE, THYROIDAB in the last 72 hours.  Invalid input(s): FREET3 Anemia work up No results for input(s): VITAMINB12, FOLATE, FERRITIN, TIBC, IRON, RETICCTPCT in the last 72 hours. Urinalysis    Component Value Date/Time   COLORURINE YELLOW 06/07/2024 2026   APPEARANCEUR HAZY (A) 06/07/2024 2026    LABSPEC 1.015 06/07/2024 2026   PHURINE 5.0 06/07/2024 2026   GLUCOSEU NEGATIVE 06/07/2024 2026   HGBUR NEGATIVE 06/07/2024 2026   BILIRUBINUR NEGATIVE 06/07/2024 2026   KETONESUR NEGATIVE 06/07/2024 2026   PROTEINUR NEGATIVE 06/07/2024 2026   NITRITE NEGATIVE 06/07/2024 2026   LEUKOCYTESUR NEGATIVE 06/07/2024 2026   Sepsis Labs Recent Labs  Lab 07/07/24 0235 07/08/24 0520 07/09/24 0338 07/10/24 0306  WBC 6.5 5.3 6.3 5.8   Microbiology No results found for this or any previous visit (from the past 240 hours).   Time coordinating discharge: Over 35 minutes  SIGNED:   Erle Odell Castor, MD  [facility] 07/10/2024, 7:59 AM Pager   If 7PM-7AM, please contact night-coverage [email or web address] Password [email or web address]

## 2024-07-10 NOTE — Plan of Care (Signed)
  Problem: Coping: Goal: Level of anxiety will decrease Outcome: Progressing   Problem: Respiratory: Goal: Ability to maintain a clear airway and adequate ventilation will improve Outcome: Progressing

## 2024-07-10 NOTE — Progress Notes (Signed)
 Occupational Therapy Treatment Patient Details Name: Christina Castaneda MRN: [id number] DOB: 1956-05-03 Today's Date: 07/10/2024   History of present illness Pt is a 68 y/o F presenting to [facility] ED on 6/15 after fall, found to have L femoral neck fx and AKI. S/p retrograde IMN on 6/20. Hgb drop on 6/23 with dark ostomy output, transfused, subsequent hypotension requiring pressors. Found to have perforated bleeding duodenal ulcer s/p ex lap on 6/23. Transferred to [facility] for PCCM. 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   OT comments  Pt seen with PT to progress functional mobility during/in preparation for functional tasks with OT focusing on ADLs in sitting and from bed level with pt largely requiring Set up-Supervision to Min assist with UB ADLs in sitting and Max assist with LB dressing from bed level. Pt requiring Mod to Max assist +2 for bed mobility and Mod assist +2 with pt using B UE to pull self up on Stedy. Pt participated well in session and is making slow but consistent progress toward OT goals. Post acute discharge, pt will benefit from intensive inpatient skilled rehab services < 3 hours per day to maximize rehab potential.       If plan is discharge home, recommend the following:  Two people to help with walking and/or transfers;Two people to help with bathing/dressing/bathroom;Assistance with cooking/housework;Assist for transportation;Help with stairs or ramp for entrance   Equipment Recommendations  Other (comment) (defer to next level of care)    Recommendations for Other Services      Precautions / Restrictions Precautions Precautions: Fall;Other (comment) Recall of Precautions/Restrictions: Intact Precaution/Restrictions Comments: JP drain, colostomy, sacral wound Required Braces or Orthoses: Knee Immobilizer - Left Knee Immobilizer - Left: On when out of bed or walking Restrictions Weight Bearing Restrictions Per Provider  Order: Yes LLE Weight Bearing Per Provider Order: Weight bearing as tolerated Other Position/Activity Restrictions: No L knee ROM restrictions       Mobility Bed Mobility Overal bed mobility: Needs Assistance Bed Mobility: Rolling, Sidelying to Sit, Sit to Supine Rolling: Mod assist, +2 for physical assistance, Used rails Sidelying to sit: Mod assist, +2 for physical assistance, HOB elevated, Used rails   Sit to supine: Max assist, +2 for physical assistance   General bed mobility comments: Pt participating well throughout; cues for technique    Transfers Overall transfer level: Needs assistance Equipment used: Ambulation equipment used Laurent) Transfers: Sit to/from Stand Sit to Stand: Mod assist, +2 physical assistance (with pt pulling self up with B UE and coming to full stand on Stedy)           General transfer comment: cues for hand placement/technique     Balance Overall balance assessment: Needs assistance Sitting-balance support: Single extremity supported, Bilateral upper extremity supported, Feet supported Sitting balance-Leahy Scale: Poor Sitting balance - Comments: able to maintain static sitting at EOB with unilateral UE support and close Supervision to CGA   Standing balance support: Bilateral upper extremity supported, During functional activity, Reliant on assistive device for balance Standing balance-Leahy Scale: Poor Standing balance comment: reliant on B UE support on Stedy and assist of Mod assist +2 to maintain standing balance                           ADL either performed or assessed with clinical judgement   ADL Overall ADL's : Needs assistance/impaired Eating/Feeding: Set up;Supervision/ safety;Sitting   Grooming: Set up;Supervision/safety;Sitting Grooming  Details (indicate cue type and reason): Supervision-Set up sitting EOB; Set up from bed level         Upper Body Dressing : Contact guard assist;Minimal assistance;Sitting    Lower Body Dressing: Maximal assistance;Bed level                      Extremity/Trunk Assessment Upper Extremity Assessment Upper Extremity Assessment: Right hand dominant;Generalized weakness   Lower Extremity Assessment Lower Extremity Assessment: Defer to PT evaluation        Vision   Vision Assessment?: No apparent visual deficits   Perception     Praxis     Communication Communication Communication: No apparent difficulties   Cognition Arousal: Alert Behavior During Therapy: WFL for tasks assessed/performed, Anxious (Overall WFL with signs of anxiety with mobility) Cognition: No apparent impairments                               Following commands: Intact        Cueing   Cueing Techniques: Verbal cues, Tactile cues, Visual cues  Exercises      Shoulder Instructions       General Comments VSS on 2.5L continuous O2 through nasal cannula    Pertinent Vitals/ Pain       Pain Assessment Pain Assessment: Faces Faces Pain Scale: Hurts little more Pain Location: LLE with movement Pain Descriptors / Indicators: Guarding, Grimacing, Sore Pain Intervention(s): Limited activity within patient's tolerance, Monitored during session, Repositioned  Home Living                                          Prior Functioning/Environment              Frequency  Min 1X/week        Progress Toward Goals  OT Goals(current goals can now be found in the care plan section)  Progress towards OT goals: Progressing toward goals  Acute Rehab OT Goals Patient Stated Goal: to go to intensive inpatient rehab and be able to return home  Plan      Co-evaluation    PT/OT/SLP Co-Evaluation/Treatment: Yes Reason for Co-Treatment: For patient/therapist safety;To address functional/ADL transfers PT goals addressed during session: Mobility/safety with mobility;Balance OT goals addressed during session: ADL's and self-care       AM-PAC OT 6 Clicks Daily Activity     Outcome Measure   Help from another person eating meals?: A Little Help from another person taking care of personal grooming?: A Little Help from another person toileting, which includes using toliet, bedpan, or urinal?: Total Help from another person bathing (including washing, rinsing, drying)?: A Lot Help from another person to put on and taking off regular upper body clothing?: A Little Help from another person to put on and taking off regular lower body clothing?: A Lot 6 Click Score: 14    End of Session Equipment Utilized During Treatment: Oxygen;Other (comment);Gait belt (Stedy)  OT Visit Diagnosis: Other abnormalities of gait and mobility (R26.89);Muscle weakness (generalized) (M62.81)   Activity Tolerance Patient tolerated treatment well   Patient Left in bed;with call bell/phone within reach   Nurse Communication Mobility status;Other (comment) (Pt requests colosotmy bag be emptied)        Time: 8756-8686 OT Time Calculation (min): 30 min  Charges: OT General Charges $OT Visit: 1  Visit OT Treatments $Self Care/Home Management : 8-22 mins  Margarie Rockey HERO., OTR/L, MA Acute Rehab [phone]   Margarie FORBES Horns 07/10/2024, 1:28 PM

## 2024-07-10 NOTE — TOC Progression Note (Addendum)
 Transition of Care [facility]) - Progression Note    Patient Details  Name: Christina Castaneda MRN: [id number] Date of Birth: 1956-10-16  Transition of Care [facility]) CM/SW Contact  Gwenn Frieze Rodri­guez [place], [place] Phone Number: 07/10/2024, 9:26 AM  Clinical Narrative:  Pt with dc order to SNF today. Home and Community/[facility] requesting updated therapy notes showing sit to stand attempt. PT/OT and MD updated re barrier to dc. Isaiah with [facility] updated.   UPDATE 1415: Updated PT/OT notes uploaded to portal. Insurance auth remains pending at this time. Confirmed with Isaiah at [facility] they are able to admit pt over weekend if auth received. MD aware of barrier to dc.   Frieze Gwenn, MSW, LCSW [phone] (coverage)      Expected Discharge Plan: Skilled Nursing Facility Barriers to Discharge: Continued Medical Work up, English as a second language teacher  Expected Discharge Plan and Services In-house Referral: Clinical Social Work Discharge Planning Services: [employer] Consult Post Acute Care Choice: Skilled Nursing Facility Living arrangements for the past 2 months: Single Family Home Expected Discharge Date: 07/10/24                                     Social Determinants of Health (SDOH) Interventions SDOH Screenings   Food Insecurity: No Food Insecurity (06/08/2024)  Housing: Low Risk  (06/08/2024)  Transportation Needs: No Transportation Needs (06/08/2024)  Utilities: Not At Risk (06/08/2024)  Social Connections: Unknown (06/08/2024)  Recent Concern: Social Connections - Moderately Isolated (05/27/2024)  Tobacco Use: High Risk (06/25/2024)    Readmission Risk Interventions    05/27/2024   10:01 PM  Readmission Risk Prevention Plan  Post Dischage Appt Complete  Medication Screening Complete  Transportation Screening Complete

## 2024-07-10 NOTE — Progress Notes (Signed)
 Physical Therapy Treatment Patient Details Name: Christina Castaneda MRN: [id number] DOB: [birth year] Today's Date: 07/10/2024   History of Present Illness Pt is a 68 y/o F presenting [facility] ED on 6/15 after fall, found to have L femoral neck fx and AKI. S/p retrograde IMN on 6/20. Hgb drop on 6/23 with dark ostomy output, transfused, subsequent hypotension requiring pressors. Found to have perforated bleeding duodenal ulcer s/p ex lap on 6/23. Transferred to [facility] for PCCM. 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    PT Comments  Though continues to be anxious about several aspects of therapy, she was able to focus on task given she had a goal to meet and was easily encouraged.  Emphasis today on transitions to EOB, scooting, sitting at EOB with minimal support and STS in the STEDY all with mod A of 2 persons.  Pt needed max +2 to return to supine, needing assist and control of trunk and B LE's   If plan is discharge home, recommend the following: Two people to help with walking and/or transfers;Two people to help with bathing/dressing/bathroom   Can travel by private vehicle     No  Equipment Recommendations   (will reassess in next venue)    Recommendations for Other Services       Precautions / Restrictions Precautions Precautions: Fall;Other (comment) Recall of Precautions/Restrictions: Intact Precaution/Restrictions Comments: JP drain, colostomy, sacral wound Required Braces or Orthoses: Knee Immobilizer - Left Knee Immobilizer - Left: On when out of bed or walking Restrictions Weight Bearing Restrictions Per Provider Order: Yes LLE Weight Bearing Per Provider Order: Weight bearing as tolerated Other Position/Activity Restrictions: No L knee ROM restrictions     Mobility  Bed Mobility Overal bed mobility: Needs Assistance Bed Mobility: Rolling, Sidelying to Sit, Sit to Supine Rolling: Mod assist, +2 for physical assistance,  Used rails Sidelying to sit: Mod assist, +2 for physical assistance, HOB elevated, Used rails   Sit to supine: Max assist, +2 for physical assistance   General bed mobility comments: Pt participating well throughout; cues for technique    Transfers Overall transfer level: Needs assistance Equipment used: Ambulation equipment used Laurent) Transfers: Sit to/from Stand Sit to Stand: Mod assist, +2 physical assistance (with pt pulling self up with B UE and coming to full stand on Stedy)           General transfer comment: cues for hand placement/technique    Ambulation/Gait               General Gait Details: not able   Stairs             Wheelchair Mobility     Tilt Bed    Modified Rankin (Stroke Patients Only)       Balance Overall balance assessment: Needs assistance Sitting-balance support: Single extremity supported, Bilateral upper extremity supported, Feet supported Sitting balance-Leahy Scale: Poor Sitting balance - Comments: able to maintain static sitting at EOB with unilateral UE support and close Supervision to CGA   Standing balance support: Bilateral upper extremity supported, During functional activity, Reliant on assistive device for balance Standing balance-Leahy Scale: Poor Standing balance comment: reliant on B UE support on Stedy and assist of Mod assist +2 to maintain standing balance  Pt was able to stand upright in the [place] and w/bear on both LE's well.  The L knee was in the immobilizer so can not address whether pt could control the L knee with today's  activity.                            Communication Communication Communication: No apparent difficulties  Cognition Arousal: Alert Behavior During Therapy: WFL for tasks assessed/performed, Anxious (Overall WFL with signs of anxiety with mobility)                             Following commands: Intact      Cueing Cueing Techniques: Verbal cues, Tactile  cues, Visual cues  Exercises      General Comments General comments (skin integrity, edema, etc.): VSS on 2.5L continuous O2 through nasal cannula      Pertinent Vitals/Pain Pain Assessment Pain Assessment: Faces Faces Pain Scale: Hurts little more Pain Location: LLE with movement Pain Descriptors / Indicators: Guarding, Grimacing, Sore Pain Intervention(s): Limited activity within patient's tolerance, Monitored during session    Home Living                          Prior Function            PT Goals (current goals can now be found in the care plan section) Acute Rehab PT Goals PT Goal Formulation: With patient Time For Goal Achievement: 07/13/24 Potential to Achieve Goals: Fair Progress towards PT goals: Progressing toward goals    Frequency    Min 1X/week      PT Plan      Co-evaluation PT/OT/SLP Co-Evaluation/Treatment: Yes Reason for Co-Treatment: For patient/therapist safety;To address functional/ADL transfers PT goals addressed during session: Mobility/safety with mobility;Balance OT goals addressed during session: ADL's and self-care      AM-PAC PT 6 Clicks Mobility   Outcome Measure  Help needed turning from your back to your side while in a flat bed without using bedrails?: A Lot Help needed moving from lying on your back to sitting on the side of a flat bed without using bedrails?: Total Help needed moving to and from a bed to a chair (including a wheelchair)?: Total Help needed standing up from a chair using your arms (e.g., wheelchair or bedside chair)?: Total Help needed to walk in hospital room?: Total Help needed climbing 3-5 steps with a railing? : Total 6 Click Score: 7    End of Session Equipment Utilized During Treatment: Gait belt;Left knee immobilizer;Oxygen Activity Tolerance: Patient tolerated treatment well;Patient limited by fatigue Patient left: in bed;with call bell/phone within reach Nurse Communication: Mobility  status PT Visit Diagnosis: Other abnormalities of gait and mobility (R26.89);Pain Pain - Right/Left: Left Pain - part of body: Hip;Knee;Leg     Time: 8757-8686 PT Time Calculation (min) (ACUTE ONLY): 31 min  Charges:    $Therapeutic Activity: 8-22 mins PT General Charges $$ ACUTE PT VISIT: 1 Visit                     07/10/2024  India HERO., PT Acute Rehabilitation Services [phone]  (office)   Vinie GAILS Famous Eisenhardt 07/10/2024, 1:40 PM

## 2024-07-11 LAB — GLUCOSE, CAPILLARY
Glucose-Capillary: 75 mg/dL (ref 70–99)
Glucose-Capillary: 118 mg/dL — ABNORMAL HIGH (ref 70–99)
Glucose-Capillary: 88 mg/dL (ref 70–99)

## 2024-07-11 NOTE — Plan of Care (Signed)
   Problem: Education: Goal: Knowledge of General Education information will improve Description Including pain rating scale, medication(s)/side effects and non-pharmacologic comfort measures Outcome: Progressing

## 2024-07-11 NOTE — Progress Notes (Signed)
 Patient cbg 68's, ensure given to patient.

## 2024-07-11 NOTE — Plan of Care (Signed)
  Problem: Education: Goal: Knowledge of General Education information will improve Description: Including pain rating scale, medication(s)/side effects and non-pharmacologic comfort measures Outcome: Progressing   Problem: Health Behavior/Discharge Planning: Goal: Ability to manage health-related needs will improve Outcome: Progressing   Problem: Clinical Measurements: Goal: Ability to maintain clinical measurements within normal limits will improve Outcome: Progressing Goal: Will remain free from infection Outcome: Progressing Goal: Diagnostic test results will improve Outcome: Progressing Goal: Respiratory complications will improve Outcome: Progressing Goal: Cardiovascular complication will be avoided Outcome: Progressing   Problem: Nutrition: Goal: Adequate nutrition will be maintained Outcome: Progressing   Problem: Coping: Goal: Level of anxiety will decrease Outcome: Progressing   Problem: Elimination: Goal: Will not experience complications related to bowel motility Outcome: Progressing Goal: Will not experience complications related to urinary retention Outcome: Progressing   Problem: Pain Managment: Goal: General experience of comfort will improve and/or be controlled Outcome: Progressing   Problem: Safety: Goal: Ability to remain free from injury will improve Outcome: Progressing   Problem: Skin Integrity: Goal: Risk for impaired skin integrity will decrease Outcome: Progressing   Problem: Activity: Goal: Ability to tolerate increased activity will improve Outcome: Progressing   Problem: Respiratory: Goal: Ability to maintain a clear airway and adequate ventilation will improve Outcome: Progressing   Problem: Role Relationship: Goal: Method of communication will improve Outcome: Progressing   Problem: Education: Goal: Understanding of CV disease, CV risk reduction, and recovery process will improve Outcome: Progressing Goal: Individualized  Educational Video(s) Outcome: Progressing   Problem: Activity: Goal: Ability to return to baseline activity level will improve Outcome: Progressing   Problem: Cardiovascular: Goal: Ability to achieve and maintain adequate cardiovascular perfusion will improve Outcome: Progressing   Problem: Health Behavior/Discharge Planning: Goal: Ability to safely manage health-related needs after discharge will improve Outcome: Progressing

## 2024-07-11 NOTE — Progress Notes (Signed)
 CVAD removed per protocol per MD order. Manual pressure applied for 3 mins. Vaseline gauze, gauze, and Tegaderm applied over insertion site. No bleeding or swelling noted. Instructed patient to remain in bed for thirty mins. Educated patient about S/S of infection and when to call MD; no heavy lifting or pressure on R side for 24 hours; keep dressing dry and intact for 24 hours. Pt verbalized comprehension.

## 2024-07-11 NOTE — Progress Notes (Signed)
 Report given to [place] [facility].

## 2024-07-11 NOTE — Progress Notes (Signed)
 [facility] PROGRESS NOTE    Progress Note  Christina LEAVELLE  FMW:[id number] DOB: Jan 30, 1956 DOA: 06/07/2024 PCP: [name], DO     Brief Narrative:   Christina Castaneda is an 68 y.o. female past medical history of perforated sigmoid colon status post hamartoma, essential hypertension was admitted to [facility] after a mechanical fall which led to a left distal femur fracture underwent nailing by orthopedic surgery on 06/12/2024.  Her course was complicated by hemorrhagic shock in the setting of a perforated duodenal ulcer underwent emergent exploratory laparotomy with a Arlyss patch on 06/12/2024 transferred to the [facility] ICU, postop she developed melanotic stool through her ostomy leading to acute blood loss anemia, ultimately IR was consulted and she status post GDA embolization on 06/26/2019 5 GI on board no plan for EGD on less risk of perforation.  Significant Events: 6/15 admit to [facility] after fall days earlier, L femur fx and AKI 6/20 OR w ortho 6/23 hgb drop, shock -- found to have perforated bleeding duodenal ulcer  6/24 extubated 6/25 off pressors, txf out of ICU  6/26 [facility] assumed care 7/2 melena in ostomy 7/3 GDA embolization 7/10 fixed dose vasopressin  infusion 7/11 brown stools in ostomy 7/13 PCCM reconsulted for complete left lung atelectasis 7/14 CXR with some resolution and she is stable so will return to floor 7/15 [facility] assumed care Significant studies: 6/15>> CT left knee: Acute comminuted fracture of distal left femoral shaft. 6/16>> renal ultrasound: No hydronephrosis 6/23>> CT angiogram abdomen: Perforated and bleeding peptic ulcer disease affecting proximal duodenum-appears to be hematoma in the gastric antrum/proximal duodenum. 6/26>> upper GI series with KUB: No evidence of contrast leak. 7/02>> CT angio GI bleed study: No active bleeding. 7/07>> CT angio GI bleed study: No active bleeding 7/09>> CT angio GI bleed; no active  bleeding  Antibiotics: 06/15/2024 IV Zosyn  stopped on 06/20/2024. Received a single dose of fluconazole  and vancomycin  on 06/15/2024   Significant microbiology data: 6/23>> blood culture: No growth 6/23>> peritoneal fluid: Abundant Candida albicans/moderate Candida glabrata.   Assessment/Plan:   Acute respiratory failure with hypoxia due to complete left lung atelectasis: Pulmonary was consulted due to mucous plugging.  Pulmonary recommended no bronchoscopy on 07/06/2024. Continue CPT and forced vital capacity. She status post hypertonic saline nebs. Will need to be cautious with sedation. Repeated chest x-ray showed bilateral effusions but improving irrigation of the left upper lung She has remained afebrile with no leukocytosis. Continue to hold anticoagulation due to high risk of bleeding. Try to encourage incentive spirometry is much as possible patient does not appear to be motivated. Out of bed to chair try to wean to room air. Awaiting skilled nursing facility placement.  Peptic ulcer perforation and hemorrhage (HCC) status post repair with a Arlyss patch and repair on 06/15/2024/acute hemorrhagic shock due to duodenal ulcer, with a JP drain placed: Status post GDA embolization on 06/25/2024. General surgery remove drain now on a soft diet. GI on board, for EGD in 4 to 6 weeks postdischarge. Continue dysphagia 3 diet. Cont. PPI and Carafate  Hemoglobin is stable  Mechanical fall with a left femoral fracture: Status post repair on 06/12/2024 femoral nailing. Weightbearing as tolerated knee immobilizer in place. Evaluated by PT OT, will need skilled nursing facility placement.  Dysphagia: SLP  Obstructive sleep apnea: Suspect untreated obstructive sleep apnea, the patient is obese and debilitated with poor abdominal compliance contributing to respiratory failure. It would be optimal she would tolerate CPAP which she has not been able  to. Continue pulmonary hygiene, incentive  spirometry and flutter valve. At baseline she is on scheduled Brovana  and Pulmicort .  Sacral decubitus ulcer stage III: Develop wound related to PureWick Foley in place temporarily. Can discontinue Foley upon discharge.  Urinary retention: Now resolved Foley in place for wound healing.  Hyperlipidemia: Continue statins.  Acute kidney injury: Likely due to hemorrhagic shock now resolved.  Gout: Continue to hold allopurinol .  Hypothyroidism Continue Synthroid .  Hypotension/essential hypertension: All antihypertensive medications were held she was started on low-dose midodrine , will continue to titrate down.      DVT prophylaxis: SCD's Family Communication:none Status is: Inpatient Remains inpatient appropriate because: Left hip fracture and acute perforated ulcer    Code Status:     Code Status Orders  (From admission, onward)           Start     Ordered   06/15/24 1938  Full code  Continuous       Question:  By:  Answer:  Consent: discussion documented in EHR   06/15/24 1937           Code Status History     Date Active Date Inactive Code Status Order ID Comments User Context   06/07/2024 2035 06/15/2024 1937 Full Code [id number]  [name], MD ED   05/27/2024 1359 05/31/2024 2032 Full Code [id number]  [name] LABOR, MD ED   05/02/2016 1941 05/23/2016 1436 Full Code [id number]  Christina Sharlet RAMAN, PA-C Inpatient   05/02/2016 1941 05/02/2016 1941 Full Code [id number]  Christina Castaneda DEVONNA Inpatient   04/08/2016 0803 05/02/2016 1941 Full Code [id number]  Christina Oh, MD Inpatient   11/18/2012 1719 11/19/2012 1509 Full Code [id number]  Means, Christina SAILOR, RN Inpatient         IV Access:   Peripheral IV   Procedures and diagnostic studies:   No results found.    Medical Consultants:   None.   Subjective:    Christina Castaneda no complaints.  Objective:    Vitals:   07/11/24 0524 07/11/24 0756 07/11/24 0813 07/11/24 0814  BP: (!) 143/65 136/72     Pulse: (!) 109 73 77   Resp:  18 18   Temp: 97.9 F (36.6 C) (!) 97.5 F (36.4 C)    TempSrc: Oral     SpO2: 99% 100% 100% 100%  Weight:      Height:       SpO2: 100 % O2 Flow Rate (L/min): 5 L/min FiO2 (%): 36 %   Intake/Output Summary (Last 24 hours) at 07/11/2024 0823 Last data filed at 07/11/2024 0400 Gross per 24 hour  Intake 240 ml  Output 1600 ml  Net -1360 ml   Filed Weights   07/06/24 0212 07/10/24 0500 07/11/24 0457  Weight: 104.2 kg 105 kg 115.5 kg    Exam: General exam: In no acute distress. Respiratory system: Good air movement and clear to auscultation. Cardiovascular system: S1 & S2 heard, RRR. No JVD. Gastrointestinal system: Abdomen is nondistended, soft and nontender.  Extremities: Left leg in brace Skin: No rashes, lesions or ulcers Psychiatry: Judgement and insight appear normal. Mood & affect appropriate.  Data Reviewed:    Labs: Basic Metabolic Panel: Recent Labs  Lab 07/05/24 0358 07/06/24 0331 07/07/24 0235 07/08/24 0520  NA 135 136 136 135  K 4.3 3.8 4.0 3.9  CL 104 97* 99 95*  CO2 27 31 31  32  GLUCOSE 77 77 81 77  BUN 12 9 13 11   CREATININE 0.82  0.87 0.79 0.80  CALCIUM  8.1* 8.2* 8.1* 8.3*  MG 2.1 2.0 1.9  --   PHOS 2.2* 3.5 2.6  --    GFR Estimated Creatinine Clearance: 83.1 mL/min (by C-G formula based on SCr of 0.8 mg/dL). Liver Function Tests: No results for input(s): AST, ALT, ALKPHOS, BILITOT, PROT, ALBUMIN  in the last 168 hours. No results for input(s): LIPASE, AMYLASE in the last 168 hours. No results for input(s): AMMONIA in the last 168 hours. Coagulation profile No results for input(s): INR, PROTIME in the last 168 hours. COVID-19 Labs  No results for input(s): DDIMER, FERRITIN, LDH, CRP in the last 72 hours.  Lab Results  Component Value Date   SARSCOV2NAA NEGATIVE 05/27/2024    CBC: Recent Labs  Lab 07/06/24 0331 07/07/24 0235 07/08/24 0520 07/09/24 0338  07/10/24 0306  WBC 6.6 6.5 5.3 6.3 5.8  NEUTROABS 4.6 4.2 3.4 3.8 3.7  HGB 10.0* 10.3* 10.7* 10.8* 10.5*  HCT 31.1* 32.9* 34.0* 33.9* 33.2*  MCV 95.4 98.2 97.1 96.3 97.1  PLT 236 250 249 256 240   Cardiac Enzymes: No results for input(s): CKTOTAL, CKMB, CKMBINDEX, TROPONINI in the last 168 hours. BNP (last 3 results) No results for input(s): PROBNP in the last 8760 hours. CBG: Recent Labs  Lab 07/10/24 0844 07/10/24 1132 07/10/24 1642 07/10/24 2150 07/11/24 0636  GLUCAP 108* 109* 127* 101* 75   D-Dimer: No results for input(s): DDIMER in the last 72 hours. Hgb A1c: No results for input(s): HGBA1C in the last 72 hours. Lipid Profile: No results for input(s): CHOL, HDL, LDLCALC, TRIG, CHOLHDL, LDLDIRECT in the last 72 hours. Thyroid  function studies: No results for input(s): TSH, T4TOTAL, T3FREE, THYROIDAB in the last 72 hours.  Invalid input(s): FREET3 Anemia work up: No results for input(s): VITAMINB12, FOLATE, FERRITIN, TIBC, IRON, RETICCTPCT in the last 72 hours. Sepsis Labs: Recent Labs  Lab 07/07/24 0235 07/08/24 0520 07/09/24 0338 07/10/24 0306  WBC 6.5 5.3 6.3 5.8   Microbiology No results found for this or any previous visit (from the past 240 hours).   Medications:    acetaminophen   650 mg Oral QID   budesonide  (PULMICORT ) nebulizer solution  0.5 mg Nebulization BID   Chlorhexidine  Gluconate Cloth  6 each Topical Daily   diphenhydrAMINE   25 mg Intravenous Once   feeding supplement  237 mL Oral TID BM   guaiFENesin   600 mg Oral BID   levothyroxine   200 mcg Oral Q0600   midodrine   2.5 mg Oral BID WC   multivitamin with minerals  1 tablet Oral Daily   nystatin    Topical BID   pantoprazole   40 mg Oral BID   sodium chloride  flush  10-40 mL Intracatheter Q12H   sucralfate   1 g Oral TID WC & HS   Continuous Infusions:  sodium chloride         LOS: 34 days   Erle Odell Castor  [facility]  07/11/2024, 8:23 AM

## 2024-07-11 NOTE — TOC Transition Note (Signed)
 Transition of Care [facility]) - Discharge Note   Patient Details  Name: Christina Castaneda MRN: [id number] Date of Birth: Oct 01, 1956  Transition of Care [facility]) CM/SW Contact:  [name], LCSW Phone Number: 07/11/2024, 12:49 PM   Clinical Narrative:   Patient received insurance approval, can admit to [facility] today. CSW sent discharge information to [facility], [facility], [facility], confirmed receipt and bed available. Transport arranged with [facility] for next available.  Nurse to call report to [phone] Room 408-2    Final next level of care: Skilled Nursing Facility Barriers to Discharge: Barriers Resolved   Patient Goals and CMS Choice Patient states their goals for this hospitalization and ongoing recovery are:: SNF CMS Medicare.gov Compare Post Acute Care list provided to:: Patient Choice offered to / list presented to : Patient  ownership interest in [facility].provided to:: Patient    Discharge Placement              Patient chooses bed at: [facility] Patient to be transferred to facility by: [facility] Name of family member notified: Self Patient and family notified of of transfer: 07/11/24  Discharge Plan and Services Additional resources added to the After Visit Summary for   In-house Referral: Clinical Social Work Discharge Planning Services: CM Consult Post Acute Care Choice: Skilled Nursing Facility                               Social Drivers of Health (SDOH) Interventions SDOH Screenings   Food Insecurity: No Food Insecurity (06/08/2024)  Housing: Low Risk  (06/08/2024)  Transportation Needs: No Transportation Needs (06/08/2024)  Utilities: Not At Risk (06/08/2024)  Social Connections: Unknown (06/08/2024)  Recent Concern: Social Connections - Moderately Isolated (05/27/2024)  Tobacco Use: High Risk (06/25/2024)     Readmission Risk Interventions    05/27/2024   10:01 PM  Readmission Risk Prevention Plan  Post Dischage Appt Complete   Medication Screening Complete  Transportation Screening Complete

## 2024-07-27 ENCOUNTER — Telehealth: Payer: Self-pay | Admitting: Orthopedic Surgery

## 2024-07-27 NOTE — Telephone Encounter (Signed)
 Per Dr. JAYSON, this pt had surgery 6/20 and needs to be seen, hasn't been in.  Per the op note:  10-14 days for incision check and XR. I spoke w/the spouse, the pt is in [facility] [phone], I have called and lvm for Amy in transportation to call me back to schedule.

## 2024-07-29 ENCOUNTER — Ambulatory Visit (INDEPENDENT_AMBULATORY_CARE_PROVIDER_SITE_OTHER): Admitting: Surgery

## 2024-07-29 VITALS — BP 138/89 | HR 92 | Temp 98.0°F | Resp 14 | Ht 65.0 in | Wt 238.0 lb

## 2024-07-29 DIAGNOSIS — Z09 Encounter for follow-up examination after completed treatment for conditions other than malignant neoplasm: Secondary | ICD-10-CM

## 2024-07-29 NOTE — Progress Notes (Signed)
 [facility] Note   HPI:  68 y.o. Female presents to clinic for post-op follow-up status post hospital admission for a left femur fracture and subsequent surgery for perforated and bleeding duodenal ulcer.  She underwent exploratory laparotomy, omental Arlyss patch repair of duodenal ulcer, and lysis of adhesions on 6/23.  She had a long hospital admission down at [facility], but was tolerating a diet and drain was able to be removed prior to discharge.  Since being out of the hospital, she has been doing well.  She intermittently has nausea associated with anxiety, but this has been doing better.  She denies any vomiting.  She feels that she has been more constipated, but she still is having ostomy output.  She denies any problems with her incision site.  Her skin staples have already been removed.  Review of Systems:  All other review of systems: otherwise negative   Vital Signs:  BP 138/89   Pulse 92   Temp 98 F (36.7 C) (Oral)   Resp 14   Ht 5' 5 (1.651 m)   Wt 238 lb (108 kg)   LMP 11/18/2012   SpO2 95%   BMI 39.61 kg/m    Physical Exam:  Physical Exam Vitals reviewed.  Constitutional:      Appearance: Normal appearance.  Abdominal:     Comments: Abdomen soft, nondistended, no percussion tenderness, nontender to palpation; no rigidity, guarding, rebound tenderness; midline incision well-healed  Neurological:     Mental Status: She is alert.     Laboratory studies: None  Imaging:  None  Assessment:  68 y.o. yo Female who presents for follow-up status post admission for left femur fracture and subsequent need for surgery for a perforated duodenal ulcer.  Plan:  - Patient overall doing well.  Tolerating a diet, moving her bowels, and no significant pain - Advised that she should continue her current medications - Will send referral to GI for patient to follow-up for possible EGD in the future given recent upper GI bleed - Follow up with me as  needed  All of the above recommendations were discussed with the patient and patient's family, and all of patient's and family's questions were answered to their expressed satisfaction.  Note: Portions of this report may have been transcribed using voice recognition software. Every effort has been made to ensure accuracy; however, inadvertent computerized transcription errors may still be present.   Dorothyann Brittle, DO [facility] [address] [place], [place] [address] [phone] (office)

## 2024-07-31 ENCOUNTER — Ambulatory Visit (INDEPENDENT_AMBULATORY_CARE_PROVIDER_SITE_OTHER): Admitting: Orthopedic Surgery

## 2024-07-31 DIAGNOSIS — S72452D Displaced supracondylar fracture without intracondylar extension of lower end of left femur, subsequent encounter for closed fracture with routine healing: Secondary | ICD-10-CM

## 2024-07-31 NOTE — Patient Instructions (Addendum)
 WBAT LLE  DC use of the knee immobilizer  Medications as needed  Will need to obtain left femur XR

## 2024-07-31 NOTE — Progress Notes (Signed)
 Orthopaedic Postop Note  Assessment: Christina Castaneda is a 68 y.o. female s/p retrograde intramedullary nail of the left distal femur fracture  DOS: 06/12/2024  Plan: Kurtis were previously removed.  There were couple of staples remaining proximally.  These were removed.  Incisions are healing well.  It is okay for her to discontinue use of the knee immobilizer.  Okay to initiate range of motion of the left knee as tolerated.  Okay to bear weight on the left leg as tolerated.  I would like to obtain x-rays at the next visit, as it was too difficult to get x-rays today.  Before next visit, we may have her present to the hospital for updated x-rays.  Documentation provided for the nursing facility.  She will follow-up in 6 weeks.  Follow-up: Return in about 6 weeks (around 09/11/2024). XR at next visit: Left femur  Subjective:  Chief Complaint  Patient presents with   Routine Post Op    L Femur DOS 06/12/24    History of Present Illness: Christina Castaneda is a 68 y.o. female who presents following the above stated procedure.  Surgery was approximately 7 weeks ago.  After her admission to [facility], she was transferred to [facility] due to some medical complication.  She remained admitted to their for a couple of weeks.  She was then discharged to a nursing facility.  For some reason, her follow-up was scheduled with Dr. Layman Navy.  He saw her, and remove most of her staples.  No x-rays were obtained.  She has remained in a knee immobilizer.  She remains nonweightbearing.  She has not been working on range of motion of the left knee.  No fevers or chills.  No issues with her surgical incisions.  Review of Systems: No fevers or chills No numbness or tingling No Chest Pain + shortness of breath   Objective: LMP 11/18/2012   Physical Exam:  Alert and oriented.  No acute distress  She is seated in a wheelchair  Surgical incisions are healing well.  No surrounding erythema or  drainage.  No redness is appreciated.  Limited range of motion of the left knee.  She has minimal discomfort with axial loading.  Sensation is intact to the dorsum of the foot.  IMAGING: I personally ordered and reviewed the following images:  No new imaging obtained today.  Oneil DELENA Horde, MD 08/01/2024 11:18 AM

## 2024-08-01 ENCOUNTER — Encounter: Payer: Self-pay | Admitting: Orthopedic Surgery

## 2024-08-03 ENCOUNTER — Encounter: Payer: Self-pay | Admitting: Internal Medicine

## 2024-08-14 ENCOUNTER — Encounter: Payer: Self-pay | Admitting: Internal Medicine

## 2024-09-02 ENCOUNTER — Ambulatory Visit: Admitting: Orthopedic Surgery

## 2024-09-02 ENCOUNTER — Other Ambulatory Visit (INDEPENDENT_AMBULATORY_CARE_PROVIDER_SITE_OTHER): Payer: Self-pay

## 2024-09-02 DIAGNOSIS — S72452D Displaced supracondylar fracture without intracondylar extension of lower end of left femur, subsequent encounter for closed fracture with routine healing: Secondary | ICD-10-CM

## 2024-09-02 DIAGNOSIS — M170 Bilateral primary osteoarthritis of knee: Secondary | ICD-10-CM | POA: Insufficient documentation

## 2024-09-02 DIAGNOSIS — M1000 Idiopathic gout, unspecified site: Secondary | ICD-10-CM | POA: Insufficient documentation

## 2024-09-02 DIAGNOSIS — E538 Deficiency of other specified B group vitamins: Secondary | ICD-10-CM | POA: Insufficient documentation

## 2024-09-02 DIAGNOSIS — R011 Cardiac murmur, unspecified: Secondary | ICD-10-CM | POA: Insufficient documentation

## 2024-09-02 DIAGNOSIS — G629 Polyneuropathy, unspecified: Secondary | ICD-10-CM | POA: Insufficient documentation

## 2024-09-02 DIAGNOSIS — I700 Atherosclerosis of aorta: Secondary | ICD-10-CM | POA: Insufficient documentation

## 2024-09-02 DIAGNOSIS — R911 Solitary pulmonary nodule: Secondary | ICD-10-CM | POA: Insufficient documentation

## 2024-09-02 DIAGNOSIS — Z90710 Acquired absence of both cervix and uterus: Secondary | ICD-10-CM | POA: Insufficient documentation

## 2024-09-02 DIAGNOSIS — Z933 Colostomy status: Secondary | ICD-10-CM | POA: Insufficient documentation

## 2024-09-02 DIAGNOSIS — M8580 Other specified disorders of bone density and structure, unspecified site: Secondary | ICD-10-CM | POA: Insufficient documentation

## 2024-09-02 DIAGNOSIS — E213 Hyperparathyroidism, unspecified: Secondary | ICD-10-CM | POA: Insufficient documentation

## 2024-09-02 DIAGNOSIS — E559 Vitamin D deficiency, unspecified: Secondary | ICD-10-CM | POA: Insufficient documentation

## 2024-09-02 DIAGNOSIS — N183 Chronic kidney disease, stage 3 unspecified: Secondary | ICD-10-CM | POA: Insufficient documentation

## 2024-09-02 NOTE — Progress Notes (Unsigned)
 Orthopaedic Postop Note  Assessment: Christina Castaneda is a 68 y.o. female s/p retrograde intramedullary nail of the left distal femur fracture  DOS: 06/12/2024  Plan: Kurtis were previously removed.  There were couple of staples remaining proximally.  These were removed.  Incisions are healing well.  It is okay for her to discontinue use of the knee immobilizer.  Okay to initiate range of motion of the left knee as tolerated.  Okay to bear weight on the left leg as tolerated.  I would like to obtain x-rays at the next visit, as it was too difficult to get x-rays today.  Before next visit, we may have her present to the hospital for updated x-rays.  Documentation provided for the nursing facility.  She will follow-up in 6 weeks.  Follow-up: No follow-ups on file. XR at next visit: Left femur  Subjective:  Chief Complaint  Patient presents with   Routine Post Op    L Femur DOS: 06/12/24    History of Present Illness: Christina Castaneda is a 68 y.o. female who presents following the above stated procedure.  Surgery was approximately 7 weeks ago.  After her admission to [facility], she was transferred to [facility] due to some medical complication.  She remained admitted to their for a couple of weeks.  She was then discharged to a nursing facility.  For some reason, her follow-up was scheduled with Dr. Layman Navy.  He saw her, and remove most of her staples.  No x-rays were obtained.  She has remained in a knee immobilizer.  She remains nonweightbearing.  She has not been working on range of motion of the left knee.  No fevers or chills.  No issues with her surgical incisions.  Review of Systems: No fevers or chills No numbness or tingling No Chest Pain + shortness of breath   Objective: LMP 11/18/2012   Physical Exam:  Alert and oriented.  No acute distress  She is seated in a wheelchair  Surgical incisions are healing well.  No surrounding erythema or drainage.  No redness is  appreciated.  Limited range of motion of the left knee.  She has minimal discomfort with axial loading.  Sensation is intact to the dorsum of the foot.  IMAGING: I personally ordered and reviewed the following images:  No new imaging obtained today.  Christina DELENA Horde, MD 09/02/2024 2:33 PM

## 2024-09-03 ENCOUNTER — Encounter: Payer: Self-pay | Admitting: Orthopedic Surgery

## 2024-09-11 ENCOUNTER — Encounter: Admitting: Orthopedic Surgery

## 2024-10-05 ENCOUNTER — Inpatient Hospital Stay (HOSPITAL_COMMUNITY)
Admission: EM | Admit: 2024-10-05 | Discharge: 2024-10-24 | DRG: 871 | Disposition: E | Source: Skilled Nursing Facility | Attending: Internal Medicine | Admitting: Internal Medicine

## 2024-10-05 DIAGNOSIS — D649 Anemia, unspecified: Secondary | ICD-10-CM | POA: Diagnosis present

## 2024-10-05 DIAGNOSIS — M109 Gout, unspecified: Secondary | ICD-10-CM | POA: Diagnosis present

## 2024-10-05 DIAGNOSIS — Z9079 Acquired absence of other genital organ(s): Secondary | ICD-10-CM

## 2024-10-05 DIAGNOSIS — R6521 Severe sepsis with septic shock: Secondary | ICD-10-CM | POA: Diagnosis present

## 2024-10-05 DIAGNOSIS — Z1621 Resistance to vancomycin: Secondary | ICD-10-CM | POA: Diagnosis present

## 2024-10-05 DIAGNOSIS — E876 Hypokalemia: Secondary | ICD-10-CM | POA: Diagnosis present

## 2024-10-05 DIAGNOSIS — F419 Anxiety disorder, unspecified: Secondary | ICD-10-CM | POA: Diagnosis present

## 2024-10-05 DIAGNOSIS — Z90710 Acquired absence of both cervix and uterus: Secondary | ICD-10-CM

## 2024-10-05 DIAGNOSIS — I2510 Atherosclerotic heart disease of native coronary artery without angina pectoris: Secondary | ICD-10-CM | POA: Diagnosis present

## 2024-10-05 DIAGNOSIS — J9621 Acute and chronic respiratory failure with hypoxia: Secondary | ICD-10-CM | POA: Diagnosis present

## 2024-10-05 DIAGNOSIS — Z825 Family history of asthma and other chronic lower respiratory diseases: Secondary | ICD-10-CM

## 2024-10-05 DIAGNOSIS — R7989 Other specified abnormal findings of blood chemistry: Secondary | ICD-10-CM

## 2024-10-05 DIAGNOSIS — L8990 Pressure ulcer of unspecified site, unspecified stage: Secondary | ICD-10-CM

## 2024-10-05 DIAGNOSIS — E871 Hypo-osmolality and hyponatremia: Secondary | ICD-10-CM | POA: Diagnosis not present

## 2024-10-05 DIAGNOSIS — I5021 Acute systolic (congestive) heart failure: Secondary | ICD-10-CM | POA: Diagnosis not present

## 2024-10-05 DIAGNOSIS — J869 Pyothorax without fistula: Secondary | ICD-10-CM | POA: Diagnosis present

## 2024-10-05 DIAGNOSIS — I21A1 Myocardial infarction type 2: Secondary | ICD-10-CM | POA: Diagnosis present

## 2024-10-05 DIAGNOSIS — Z515 Encounter for palliative care: Secondary | ICD-10-CM

## 2024-10-05 DIAGNOSIS — R6520 Severe sepsis without septic shock: Secondary | ICD-10-CM | POA: Diagnosis present

## 2024-10-05 DIAGNOSIS — N182 Chronic kidney disease, stage 2 (mild): Secondary | ICD-10-CM | POA: Diagnosis present

## 2024-10-05 DIAGNOSIS — G9341 Metabolic encephalopathy: Secondary | ICD-10-CM | POA: Diagnosis present

## 2024-10-05 DIAGNOSIS — E66812 Obesity, class 2: Secondary | ICD-10-CM | POA: Diagnosis present

## 2024-10-05 DIAGNOSIS — Z66 Do not resuscitate: Secondary | ICD-10-CM | POA: Diagnosis not present

## 2024-10-05 DIAGNOSIS — F05 Delirium due to known physiological condition: Secondary | ICD-10-CM | POA: Diagnosis not present

## 2024-10-05 DIAGNOSIS — I10 Essential (primary) hypertension: Secondary | ICD-10-CM | POA: Diagnosis present

## 2024-10-05 DIAGNOSIS — Z79899 Other long term (current) drug therapy: Secondary | ICD-10-CM

## 2024-10-05 DIAGNOSIS — Z7951 Long term (current) use of inhaled steroids: Secondary | ICD-10-CM

## 2024-10-05 DIAGNOSIS — Z781 Physical restraint status: Secondary | ICD-10-CM

## 2024-10-05 DIAGNOSIS — R1310 Dysphagia, unspecified: Secondary | ICD-10-CM | POA: Diagnosis present

## 2024-10-05 DIAGNOSIS — M21372 Foot drop, left foot: Secondary | ICD-10-CM | POA: Diagnosis present

## 2024-10-05 DIAGNOSIS — E860 Dehydration: Secondary | ICD-10-CM | POA: Diagnosis present

## 2024-10-05 DIAGNOSIS — J918 Pleural effusion in other conditions classified elsewhere: Secondary | ICD-10-CM | POA: Diagnosis present

## 2024-10-05 DIAGNOSIS — E785 Hyperlipidemia, unspecified: Secondary | ICD-10-CM | POA: Diagnosis present

## 2024-10-05 DIAGNOSIS — Z90722 Acquired absence of ovaries, bilateral: Secondary | ICD-10-CM

## 2024-10-05 DIAGNOSIS — J9601 Acute respiratory failure with hypoxia: Secondary | ICD-10-CM | POA: Diagnosis present

## 2024-10-05 DIAGNOSIS — Z933 Colostomy status: Secondary | ICD-10-CM

## 2024-10-05 DIAGNOSIS — Z7401 Bed confinement status: Secondary | ICD-10-CM

## 2024-10-05 DIAGNOSIS — Z888 Allergy status to other drugs, medicaments and biological substances status: Secondary | ICD-10-CM

## 2024-10-05 DIAGNOSIS — G9340 Encephalopathy, unspecified: Secondary | ICD-10-CM

## 2024-10-05 DIAGNOSIS — I130 Hypertensive heart and chronic kidney disease with heart failure and stage 1 through stage 4 chronic kidney disease, or unspecified chronic kidney disease: Secondary | ICD-10-CM | POA: Diagnosis present

## 2024-10-05 DIAGNOSIS — A419 Sepsis, unspecified organism: Principal | ICD-10-CM | POA: Diagnosis present

## 2024-10-05 DIAGNOSIS — Z833 Family history of diabetes mellitus: Secondary | ICD-10-CM

## 2024-10-05 DIAGNOSIS — B952 Enterococcus as the cause of diseases classified elsewhere: Secondary | ICD-10-CM | POA: Diagnosis present

## 2024-10-05 DIAGNOSIS — Z8249 Family history of ischemic heart disease and other diseases of the circulatory system: Secondary | ICD-10-CM

## 2024-10-05 DIAGNOSIS — R531 Weakness: Secondary | ICD-10-CM | POA: Diagnosis present

## 2024-10-05 DIAGNOSIS — J4489 Other specified chronic obstructive pulmonary disease: Secondary | ICD-10-CM | POA: Diagnosis present

## 2024-10-05 DIAGNOSIS — J9811 Atelectasis: Secondary | ICD-10-CM | POA: Diagnosis present

## 2024-10-05 DIAGNOSIS — F17210 Nicotine dependence, cigarettes, uncomplicated: Secondary | ICD-10-CM | POA: Diagnosis present

## 2024-10-05 DIAGNOSIS — R739 Hyperglycemia, unspecified: Secondary | ICD-10-CM

## 2024-10-05 DIAGNOSIS — D75839 Thrombocytosis, unspecified: Secondary | ICD-10-CM | POA: Diagnosis present

## 2024-10-05 DIAGNOSIS — Z6837 Body mass index (BMI) 37.0-37.9, adult: Secondary | ICD-10-CM

## 2024-10-05 DIAGNOSIS — G4733 Obstructive sleep apnea (adult) (pediatric): Secondary | ICD-10-CM | POA: Diagnosis present

## 2024-10-05 DIAGNOSIS — M21371 Foot drop, right foot: Secondary | ICD-10-CM | POA: Diagnosis present

## 2024-10-05 DIAGNOSIS — N390 Urinary tract infection, site not specified: Secondary | ICD-10-CM | POA: Diagnosis present

## 2024-10-05 DIAGNOSIS — J90 Pleural effusion, not elsewhere classified: Secondary | ICD-10-CM

## 2024-10-05 DIAGNOSIS — Z6839 Body mass index (BMI) 39.0-39.9, adult: Secondary | ICD-10-CM

## 2024-10-05 DIAGNOSIS — D631 Anemia in chronic kidney disease: Secondary | ICD-10-CM | POA: Diagnosis present

## 2024-10-05 DIAGNOSIS — K631 Perforation of intestine (nontraumatic): Secondary | ICD-10-CM | POA: Diagnosis present

## 2024-10-05 DIAGNOSIS — Z880 Allergy status to penicillin: Secondary | ICD-10-CM

## 2024-10-05 DIAGNOSIS — E039 Hypothyroidism, unspecified: Secondary | ICD-10-CM | POA: Diagnosis present

## 2024-10-05 DIAGNOSIS — N183 Chronic kidney disease, stage 3 unspecified: Secondary | ICD-10-CM | POA: Diagnosis present

## 2024-10-05 DIAGNOSIS — J449 Chronic obstructive pulmonary disease, unspecified: Secondary | ICD-10-CM | POA: Diagnosis present

## 2024-10-05 DIAGNOSIS — L89153 Pressure ulcer of sacral region, stage 3: Secondary | ICD-10-CM | POA: Diagnosis present

## 2024-10-05 DIAGNOSIS — Z79890 Hormone replacement therapy (postmenopausal): Secondary | ICD-10-CM

## 2024-10-05 DIAGNOSIS — E873 Alkalosis: Secondary | ICD-10-CM | POA: Diagnosis present

## 2024-10-05 DIAGNOSIS — Z9049 Acquired absence of other specified parts of digestive tract: Secondary | ICD-10-CM

## 2024-10-05 MED ORDER — ALBUTEROL SULFATE (2.5 MG/3ML) 0.083% IN NEBU
15.0000 mg/h | INHALATION_SOLUTION | Freq: Once | RESPIRATORY_TRACT | Status: AC
  Administered 2024-10-06: 15 mg/h via RESPIRATORY_TRACT
  Filled 2024-10-05: qty 18

## 2024-10-05 MED ORDER — IPRATROPIUM BROMIDE 0.02 % IN SOLN
0.5000 mg | Freq: Once | RESPIRATORY_TRACT | Status: AC
  Administered 2024-10-06: 0.5 mg via RESPIRATORY_TRACT
  Filled 2024-10-05: qty 2.5

## 2024-10-05 NOTE — ED Provider Notes (Signed)
 [facility] EMERGENCY DEPARTMENT AT [facility] Provider Note   CSN: [id number] Arrival date & time: 10/05/24  2337     Patient presents with: No chief complaint on file.   Christina Castaneda is a 68 y.o. female.  {Add pertinent medical, surgical, social history, OB history to YEP:67052} The history is provided by the EMS personnel and the nursing home. The history is limited by the condition of the patient (Altered mental status).   She has history of hypertension, COPD was transferred from skilled nursing facility because of altered mental status.  She is reportedly on 2 antibiotics although it is not clear what is being treated from the antibiotics.  She was reported to have had a fever.  Also, she has had hypoxia in spite of being on her baseline.  Patient is not able to give any coherent history.    Prior to Admission medications   Medication Sig Start Date End Date Taking? Authorizing Provider  acetaminophen  (TYLENOL ) 500 MG tablet Take 1,000 mg by mouth every 6 (six) hours.    [provider]  albuterol  (VENTOLIN  HFA) 108 (90 Base) MCG/ACT inhaler Inhale 2 puffs into the lungs every 6 (six) hours as needed. Wheezing and shortness of breath 05/31/24 07/30/24  [name] LABOR, MD  allopurinol  (ZYLOPRIM ) 300 MG tablet Take 300 mg by mouth daily.    [provider]  atorvastatin (LIPITOR) 20 MG tablet Take 20 mg by mouth daily.    [provider]  Cyanocobalamin (VITAMIN B-12 CR PO) Take 1 tablet by mouth daily.    [provider]  Ergocalciferol  50 MCG (2000 UT) TABS Take 2,000 Units by mouth daily.    [provider]  fluticasone -salmeterol (ADVAIR) 500-50 MCG/ACT AEPB Inhale 1 puff into the lungs in the morning and at bedtime. 05/31/24 07/29/24  [name] LABOR, MD  gabapentin  (NEURONTIN ) 300 MG capsule Take 1-2 capsules by mouth at bedtime. 02/27/21   [provider]  levothyroxine  (SYNTHROID ) 137 MCG tablet Take 137 mcg by  mouth daily.    [provider]  nystatin  (MYCOSTATIN /NYSTOP ) powder Apply topically 3 (three) times daily. 05/31/24   Shahmehdi, Adriana LABOR, MD  pantoprazole  (PROTONIX ) 40 MG tablet Take 1 tablet (40 mg total) by mouth 2 (two) times daily. 07/10/24   Odell Celinda Balo, MD  sucralfate  (CARAFATE ) 1 GM/10ML suspension Take 10 mLs (1 g total) by mouth 4 (four) times daily -  with meals and at bedtime. 07/10/24   Odell Celinda Balo, MD    Allergies: Penicillins, Biphosphate, and Fluticasone     Review of Systems  Unable to perform ROS: Mental status change    Updated Vital Signs LMP 11/18/2012   Physical Exam Vitals and nursing note reviewed.   68 year old female, awake and alert, and in no acute distress. Vital signs are ***. Oxygen saturation is ***%, which is normal. Head is normocephalic and atraumatic. PERRLA, EOMI. Oropharynx is clear. Neck is nontender and supple without adenopathy. Lungs have mild, diffuse wheezes without rales or rhonchi. Chest is nontender. Heart has regular rate and rhythm without murmur. Abdomen is soft, flat, nontender.  Colostomy present right lower quadrant. Extremities have 2+ edema. Skin is warm and dry without rash. Neurologic: Awake and alert, oriented to person and place but not time.  (all labs ordered are listed, but only abnormal results are displayed) Labs Reviewed - No data to display  EKG: None  Radiology: No results found.  {Document cardiac monitor, telemetry assessment procedure  when appropriate:32947} Procedures   Medications Ordered in the ED - No data to display    {Click here for ABCD2, HEART and other calculators REFRESH Note before signing:1}                              Medical Decision Making  Altered mental status in the setting of reported fever.  This is concerning for sepsis and I have initiated the evolving sepsis pathway.  I have reviewed her old records, and note hospitalization on 06/07/2024 for perforated  ulcer.  Because of evidence of bronchospasm, I have ordered nebulizer treatment with albuterol  and ipratropium.  On further review of past records, she has had CO2 retention with respiratory acidosis, so I have ordered a blood gas to evaluate for this.  {Document critical care time when appropriate  Document review of labs and clinical decision tools ie CHADS2VASC2, etc  Document your independent review of radiology images and any outside records  Document your discussion with family members, caretakers and with consultants  Document social determinants of health affecting pt's care  Document your decision making why or why not admission, treatments were needed:32947:::1}   Final diagnoses:  None    ED Discharge Orders     None

## 2024-10-06 ENCOUNTER — Emergency Department (HOSPITAL_COMMUNITY)

## 2024-10-06 ENCOUNTER — Encounter (HOSPITAL_COMMUNITY): Payer: Self-pay | Admitting: Emergency Medicine

## 2024-10-06 ENCOUNTER — Inpatient Hospital Stay (HOSPITAL_COMMUNITY)

## 2024-10-06 ENCOUNTER — Other Ambulatory Visit: Payer: Self-pay

## 2024-10-06 DIAGNOSIS — I5021 Acute systolic (congestive) heart failure: Secondary | ICD-10-CM | POA: Diagnosis not present

## 2024-10-06 DIAGNOSIS — I21A1 Myocardial infarction type 2: Secondary | ICD-10-CM | POA: Diagnosis present

## 2024-10-06 DIAGNOSIS — Z79899 Other long term (current) drug therapy: Secondary | ICD-10-CM | POA: Diagnosis not present

## 2024-10-06 DIAGNOSIS — L89153 Pressure ulcer of sacral region, stage 3: Secondary | ICD-10-CM | POA: Diagnosis present

## 2024-10-06 DIAGNOSIS — J9600 Acute respiratory failure, unspecified whether with hypoxia or hypercapnia: Secondary | ICD-10-CM

## 2024-10-06 DIAGNOSIS — G9340 Encephalopathy, unspecified: Secondary | ICD-10-CM | POA: Diagnosis not present

## 2024-10-06 DIAGNOSIS — R7989 Other specified abnormal findings of blood chemistry: Secondary | ICD-10-CM | POA: Diagnosis not present

## 2024-10-06 DIAGNOSIS — J918 Pleural effusion in other conditions classified elsewhere: Secondary | ICD-10-CM | POA: Diagnosis present

## 2024-10-06 DIAGNOSIS — E871 Hypo-osmolality and hyponatremia: Secondary | ICD-10-CM | POA: Diagnosis present

## 2024-10-06 DIAGNOSIS — A419 Sepsis, unspecified organism: Secondary | ICD-10-CM | POA: Diagnosis present

## 2024-10-06 DIAGNOSIS — N182 Chronic kidney disease, stage 2 (mild): Secondary | ICD-10-CM

## 2024-10-06 DIAGNOSIS — K631 Perforation of intestine (nontraumatic): Secondary | ICD-10-CM | POA: Diagnosis not present

## 2024-10-06 DIAGNOSIS — D649 Anemia, unspecified: Secondary | ICD-10-CM | POA: Diagnosis present

## 2024-10-06 DIAGNOSIS — D72829 Elevated white blood cell count, unspecified: Secondary | ICD-10-CM

## 2024-10-06 DIAGNOSIS — Z789 Other specified health status: Secondary | ICD-10-CM | POA: Diagnosis not present

## 2024-10-06 DIAGNOSIS — Z7189 Other specified counseling: Secondary | ICD-10-CM

## 2024-10-06 DIAGNOSIS — J449 Chronic obstructive pulmonary disease, unspecified: Secondary | ICD-10-CM

## 2024-10-06 DIAGNOSIS — R6520 Severe sepsis without septic shock: Secondary | ICD-10-CM

## 2024-10-06 DIAGNOSIS — B952 Enterococcus as the cause of diseases classified elsewhere: Secondary | ICD-10-CM | POA: Diagnosis present

## 2024-10-06 DIAGNOSIS — J9621 Acute and chronic respiratory failure with hypoxia: Secondary | ICD-10-CM | POA: Diagnosis not present

## 2024-10-06 DIAGNOSIS — R6521 Severe sepsis with septic shock: Secondary | ICD-10-CM | POA: Diagnosis present

## 2024-10-06 DIAGNOSIS — G4733 Obstructive sleep apnea (adult) (pediatric): Secondary | ICD-10-CM

## 2024-10-06 DIAGNOSIS — I2510 Atherosclerotic heart disease of native coronary artery without angina pectoris: Secondary | ICD-10-CM | POA: Diagnosis not present

## 2024-10-06 DIAGNOSIS — G9341 Metabolic encephalopathy: Secondary | ICD-10-CM | POA: Diagnosis present

## 2024-10-06 DIAGNOSIS — I130 Hypertensive heart and chronic kidney disease with heart failure and stage 1 through stage 4 chronic kidney disease, or unspecified chronic kidney disease: Secondary | ICD-10-CM | POA: Diagnosis present

## 2024-10-06 DIAGNOSIS — J9601 Acute respiratory failure with hypoxia: Secondary | ICD-10-CM | POA: Diagnosis present

## 2024-10-06 DIAGNOSIS — L8990 Pressure ulcer of unspecified site, unspecified stage: Secondary | ICD-10-CM

## 2024-10-06 DIAGNOSIS — I10 Essential (primary) hypertension: Secondary | ICD-10-CM | POA: Diagnosis not present

## 2024-10-06 DIAGNOSIS — F05 Delirium due to known physiological condition: Secondary | ICD-10-CM | POA: Diagnosis not present

## 2024-10-06 DIAGNOSIS — J9811 Atelectasis: Secondary | ICD-10-CM | POA: Diagnosis present

## 2024-10-06 DIAGNOSIS — L89156 Pressure-induced deep tissue damage of sacral region: Secondary | ICD-10-CM | POA: Diagnosis not present

## 2024-10-06 DIAGNOSIS — J9611 Chronic respiratory failure with hypoxia: Secondary | ICD-10-CM | POA: Diagnosis not present

## 2024-10-06 DIAGNOSIS — J90 Pleural effusion, not elsewhere classified: Secondary | ICD-10-CM | POA: Diagnosis not present

## 2024-10-06 DIAGNOSIS — J810 Acute pulmonary edema: Secondary | ICD-10-CM | POA: Diagnosis not present

## 2024-10-06 DIAGNOSIS — Z515 Encounter for palliative care: Secondary | ICD-10-CM

## 2024-10-06 DIAGNOSIS — J4489 Other specified chronic obstructive pulmonary disease: Secondary | ICD-10-CM | POA: Diagnosis present

## 2024-10-06 DIAGNOSIS — I509 Heart failure, unspecified: Secondary | ICD-10-CM | POA: Diagnosis not present

## 2024-10-06 DIAGNOSIS — E039 Hypothyroidism, unspecified: Secondary | ICD-10-CM | POA: Diagnosis present

## 2024-10-06 DIAGNOSIS — N1831 Chronic kidney disease, stage 3a: Secondary | ICD-10-CM | POA: Diagnosis not present

## 2024-10-06 DIAGNOSIS — R1310 Dysphagia, unspecified: Secondary | ICD-10-CM | POA: Diagnosis present

## 2024-10-06 DIAGNOSIS — J869 Pyothorax without fistula: Secondary | ICD-10-CM | POA: Diagnosis present

## 2024-10-06 DIAGNOSIS — R0600 Dyspnea, unspecified: Secondary | ICD-10-CM | POA: Diagnosis not present

## 2024-10-06 DIAGNOSIS — Z66 Do not resuscitate: Secondary | ICD-10-CM | POA: Diagnosis not present

## 2024-10-06 DIAGNOSIS — E873 Alkalosis: Secondary | ICD-10-CM | POA: Diagnosis present

## 2024-10-06 DIAGNOSIS — N390 Urinary tract infection, site not specified: Secondary | ICD-10-CM | POA: Diagnosis present

## 2024-10-06 LAB — COMPREHENSIVE METABOLIC PANEL WITH GFR
ALT: 7 U/L (ref 0–44)
AST: 30 U/L (ref 15–41)
Albumin: 2.5 g/dL — ABNORMAL LOW (ref 3.5–5.0)
Alkaline Phosphatase: 96 U/L (ref 38–126)
Anion gap: 8 (ref 5–15)
BUN: 20 mg/dL (ref 8–23)
CO2: 32 mmol/L (ref 22–32)
Calcium: 10.2 mg/dL (ref 8.9–10.3)
Chloride: 89 mmol/L — ABNORMAL LOW (ref 98–111)
Creatinine, Ser: 1.01 mg/dL — ABNORMAL HIGH (ref 0.44–1.00)
GFR, Estimated: 60 mL/min — ABNORMAL LOW (ref >60)
Glucose, Bld: 106 mg/dL — ABNORMAL HIGH (ref 70–99)
Potassium: 3.8 mmol/L (ref 3.5–5.1)
Sodium: 130 mmol/L — ABNORMAL LOW (ref 135–145)
Total Bilirubin: 0.3 mg/dL (ref 0.0–1.2)
Total Protein: 7.1 g/dL (ref 6.5–8.1)

## 2024-10-06 LAB — URINALYSIS, W/ REFLEX TO CULTURE (INFECTION SUSPECTED)
Bilirubin Urine: NEGATIVE
Glucose, UA: NEGATIVE mg/dL
Ketones, ur: NEGATIVE mg/dL
Nitrite: NEGATIVE
Protein, ur: 100 mg/dL — AB
Specific Gravity, Urine: 1.019 (ref 1.005–1.030)
WBC, UA: >50 WBC/hpf (ref 0–5)
pH: 5 (ref 5.0–8.0)

## 2024-10-06 LAB — BLOOD GAS, ARTERIAL
Acid-Base Excess: 17.3 mmol/L — ABNORMAL HIGH (ref 0.0–2.0)
Bicarbonate: 41.3 mmol/L — ABNORMAL HIGH (ref 20.0–28.0)
Drawn by: 35043
O2 Saturation: 95.6 %
Patient temperature: 37.8
pCO2 arterial: 46 mmHg (ref 32–48)
pH, Arterial: 7.57 — ABNORMAL HIGH (ref 7.35–7.45)
pO2, Arterial: 66 mmHg — ABNORMAL LOW (ref 83–108)

## 2024-10-06 LAB — CBC WITH DIFFERENTIAL/PLATELET
Abs Immature Granulocytes: 0.16 K/uL — ABNORMAL HIGH (ref 0.00–0.07)
Basophils Absolute: 0 K/uL (ref 0.0–0.1)
Basophils Relative: 0 %
Eosinophils Absolute: 0 K/uL (ref 0.0–0.5)
Eosinophils Relative: 0 %
HCT: 34 % — ABNORMAL LOW (ref 36.0–46.0)
Hemoglobin: 10.3 g/dL — ABNORMAL LOW (ref 12.0–15.0)
Immature Granulocytes: 1 %
Lymphocytes Relative: 4 %
Lymphs Abs: 0.8 K/uL (ref 0.7–4.0)
MCH: 27.2 pg (ref 26.0–34.0)
MCHC: 30.3 g/dL (ref 30.0–36.0)
MCV: 89.9 fL (ref 80.0–100.0)
Monocytes Absolute: 0.6 K/uL (ref 0.1–1.0)
Monocytes Relative: 3 %
Neutro Abs: 19 K/uL — ABNORMAL HIGH (ref 1.7–7.7)
Neutrophils Relative %: 92 %
Platelets: 468 K/uL — ABNORMAL HIGH (ref 150–400)
RBC: 3.78 MIL/uL — ABNORMAL LOW (ref 3.87–5.11)
RDW: 15.9 % — ABNORMAL HIGH (ref 11.5–15.5)
WBC: 20.6 K/uL — ABNORMAL HIGH (ref 4.0–10.5)
nRBC: 0 % (ref 0.0–0.2)

## 2024-10-06 LAB — RESPIRATORY PANEL BY PCR

## 2024-10-06 LAB — CBC
HCT: 30.7 % — ABNORMAL LOW (ref 36.0–46.0)
Hemoglobin: 9.4 g/dL — ABNORMAL LOW (ref 12.0–15.0)
MCH: 27.5 pg (ref 26.0–34.0)
MCHC: 30.6 g/dL (ref 30.0–36.0)
MCV: 89.8 fL (ref 80.0–100.0)
Platelets: 445 K/uL — ABNORMAL HIGH (ref 150–400)
RBC: 3.42 MIL/uL — ABNORMAL LOW (ref 3.87–5.11)
RDW: 15.9 % — ABNORMAL HIGH (ref 11.5–15.5)
WBC: 24.2 K/uL — ABNORMAL HIGH (ref 4.0–10.5)
nRBC: 0 % (ref 0.0–0.2)

## 2024-10-06 LAB — D-DIMER, QUANTITATIVE: D-Dimer, Quant: >20 ug{FEU}/mL — ABNORMAL HIGH (ref 0.00–0.50)

## 2024-10-06 LAB — BASIC METABOLIC PANEL WITH GFR
Anion gap: 11 (ref 5–15)
BUN: 22 mg/dL (ref 8–23)
CO2: 28 mmol/L (ref 22–32)
Calcium: 9.7 mg/dL (ref 8.9–10.3)
Chloride: 90 mmol/L — ABNORMAL LOW (ref 98–111)
Creatinine, Ser: 1.21 mg/dL — ABNORMAL HIGH (ref 0.44–1.00)
GFR, Estimated: 48 mL/min — ABNORMAL LOW (ref >60)
Glucose, Bld: 117 mg/dL — ABNORMAL HIGH (ref 70–99)
Potassium: 3.4 mmol/L — ABNORMAL LOW (ref 3.5–5.1)
Sodium: 130 mmol/L — ABNORMAL LOW (ref 135–145)

## 2024-10-06 LAB — GLUCOSE, CAPILLARY: Glucose-Capillary: 124 mg/dL — ABNORMAL HIGH (ref 70–99)

## 2024-10-06 LAB — AMMONIA: Ammonia: <13 umol/L (ref 9–35)

## 2024-10-06 LAB — PROTIME-INR
INR: 1.2 (ref 0.8–1.2)
Prothrombin Time: 16 s — ABNORMAL HIGH (ref 11.4–15.2)

## 2024-10-06 LAB — LACTIC ACID, PLASMA
Lactic Acid, Venous: 1.7 mmol/L (ref 0.5–1.9)
Lactic Acid, Venous: 1.7 mmol/L (ref 0.5–1.9)

## 2024-10-06 LAB — MRSA NEXT GEN BY PCR, NASAL: MRSA by PCR Next Gen: NOT DETECTED

## 2024-10-06 MED ORDER — LACTATED RINGERS IV BOLUS (SEPSIS)
1000.0000 mL | Freq: Once | INTRAVENOUS | Status: AC
  Administered 2024-10-06: 1000 mL via INTRAVENOUS

## 2024-10-06 MED ORDER — METRONIDAZOLE 500 MG/100ML IV SOLN
500.0000 mg | Freq: Two times a day (BID) | INTRAVENOUS | Status: DC
  Administered 2024-10-06 – 2024-10-09 (×7): 500 mg via INTRAVENOUS
  Filled 2024-10-06 (×7): qty 100

## 2024-10-06 MED ORDER — METRONIDAZOLE 500 MG/100ML IV SOLN
500.0000 mg | Freq: Once | INTRAVENOUS | Status: AC
  Administered 2024-10-06: 500 mg via INTRAVENOUS
  Filled 2024-10-06: qty 100

## 2024-10-06 MED ORDER — VANCOMYCIN HCL IN DEXTROSE 1-5 GM/200ML-% IV SOLN
1000.0000 mg | Freq: Once | INTRAVENOUS | Status: AC
  Administered 2024-10-06: 1000 mg via INTRAVENOUS
  Filled 2024-10-06: qty 200

## 2024-10-06 MED ORDER — HYDROMORPHONE HCL 1 MG/ML IJ SOLN
0.5000 mg | INTRAMUSCULAR | Status: DC | PRN
  Administered 2024-10-06 – 2024-10-11 (×12): 0.5 mg via INTRAVENOUS
  Filled 2024-10-06 (×3): qty 0.5
  Filled 2024-10-06: qty 1
  Filled 2024-10-06 (×5): qty 0.5
  Filled 2024-10-06 (×2): qty 1
  Filled 2024-10-06: qty 0.5

## 2024-10-06 MED ORDER — NOREPINEPHRINE 4 MG/250ML-% IV SOLN: 0.0000 ug/min | INTRAVENOUS | Status: DC

## 2024-10-06 MED ORDER — ONDANSETRON HCL 4 MG PO TABS: 4.0000 mg | ORAL_TABLET | Freq: Four times a day (QID) | ORAL | Status: DC | PRN

## 2024-10-06 MED ORDER — ORAL CARE MOUTH RINSE: 15.0000 mL | OROMUCOSAL | Status: DC | PRN

## 2024-10-06 MED ORDER — POTASSIUM CHLORIDE 10 MEQ/100ML IV SOLN
10.0000 meq | INTRAVENOUS | Status: AC
  Administered 2024-10-06 (×3): 10 meq via INTRAVENOUS
  Filled 2024-10-06 (×3): qty 100

## 2024-10-06 MED ORDER — SODIUM CHLORIDE 0.9 % IV SOLN
250.0000 mL | INTRAVENOUS | Status: AC
  Administered 2024-10-06: 250 mL via INTRAVENOUS

## 2024-10-06 MED ORDER — SODIUM CHLORIDE 0.9% FLUSH
10.0000 mL | Freq: Two times a day (BID) | INTRAVENOUS | Status: DC
  Administered 2024-10-07: 10 mL
  Administered 2024-10-07: 20 mL
  Administered 2024-10-08 (×2): 10 mL
  Administered 2024-10-09: 20 mL
  Administered 2024-10-09 – 2024-10-10 (×3): 10 mL

## 2024-10-06 MED ORDER — LACTATED RINGERS IV BOLUS (SEPSIS)
500.0000 mL | Freq: Once | INTRAVENOUS | Status: AC
  Administered 2024-10-06: 500 mL via INTRAVENOUS

## 2024-10-06 MED ORDER — LACTATED RINGERS IV SOLN: INTRAVENOUS | Status: DC

## 2024-10-06 MED ORDER — SODIUM CHLORIDE 0.9 % IV SOLN
2.0000 g | Freq: Two times a day (BID) | INTRAVENOUS | Status: DC
  Administered 2024-10-06 – 2024-10-09 (×7): 2 g via INTRAVENOUS
  Filled 2024-10-06 (×7): qty 12.5

## 2024-10-06 MED ORDER — NOREPINEPHRINE 4 MG/250ML-% IV SOLN
0.0000 ug/min | INTRAVENOUS | Status: AC
Start: 2024-10-06 — End: ?
  Administered 2024-10-06: 4 ug/min via INTRAVENOUS
  Administered 2024-10-06: 2 ug/min via INTRAVENOUS
  Administered 2024-10-07: 5 ug/min via INTRAVENOUS
  Filled 2024-10-06 (×3): qty 250

## 2024-10-06 MED ORDER — ENOXAPARIN SODIUM 60 MG/0.6ML IJ SOSY: 55.0000 mg | PREFILLED_SYRINGE | INTRAMUSCULAR | Status: DC

## 2024-10-06 MED ORDER — ONDANSETRON HCL 4 MG/2ML IJ SOLN: 4.0000 mg | Freq: Four times a day (QID) | INTRAMUSCULAR | Status: DC | PRN

## 2024-10-06 MED ORDER — VANCOMYCIN HCL IN DEXTROSE 1-5 GM/200ML-% IV SOLN
1000.0000 mg | INTRAVENOUS | Status: DC
  Administered 2024-10-06 – 2024-10-09 (×4): 1000 mg via INTRAVENOUS
  Filled 2024-10-06 (×4): qty 200

## 2024-10-06 MED ORDER — HYDROMORPHONE HCL 1 MG/ML IJ SOLN: 0.5000 mg | INTRAMUSCULAR | Status: DC | PRN

## 2024-10-06 MED ORDER — CHLORHEXIDINE GLUCONATE CLOTH 2 % EX PADS
6.0000 | MEDICATED_PAD | Freq: Every day | CUTANEOUS | Status: DC
  Administered 2024-10-06 – 2024-10-11 (×7): 6 via TOPICAL

## 2024-10-06 MED ORDER — PANTOPRAZOLE SODIUM 40 MG IV SOLR
40.0000 mg | INTRAVENOUS | Status: DC
  Administered 2024-10-06 – 2024-10-11 (×6): 40 mg via INTRAVENOUS
  Filled 2024-10-06 (×6): qty 10

## 2024-10-06 MED ORDER — SODIUM CHLORIDE 0.9 % IV SOLN
2.0000 g | Freq: Once | INTRAVENOUS | Status: AC
  Administered 2024-10-06: 2 g via INTRAVENOUS
  Filled 2024-10-06: qty 10

## 2024-10-06 MED ORDER — ENOXAPARIN SODIUM 40 MG/0.4ML IJ SOSY: 40.0000 mg | PREFILLED_SYRINGE | INTRAMUSCULAR | Status: DC

## 2024-10-06 MED ORDER — MORPHINE SULFATE (PF) 2 MG/ML IV SOLN
2.0000 mg | INTRAVENOUS | Status: DC | PRN
  Administered 2024-10-06: 2 mg via INTRAVENOUS
  Filled 2024-10-06: qty 1

## 2024-10-06 MED ORDER — SODIUM CHLORIDE 0.9% FLUSH
10.0000 mL | INTRAVENOUS | Status: DC | PRN
  Administered 2024-10-11: 10 mL

## 2024-10-06 MED ORDER — HYDROMORPHONE HCL 1 MG/ML IJ SOLN
1.0000 mg | INTRAMUSCULAR | Status: DC | PRN
  Administered 2024-10-06: 1 mg via INTRAVENOUS
  Filled 2024-10-06: qty 1

## 2024-10-06 MED ORDER — NALOXONE HCL 0.4 MG/ML IJ SOLN
0.4000 mg | Freq: Once | INTRAMUSCULAR | Status: AC
  Administered 2024-10-06: 0.4 mg via INTRAVENOUS
  Filled 2024-10-06: qty 1

## 2024-10-06 NOTE — Consult Note (Addendum)
 WOC Nurse Consult Note: Consult requested for sacrum and left leg.  Performed remotely after review of photos and progress notes in the EMR.  Sacrum with chronic Stage 4 pressure injury; dark red and moist with small amt bloody drainage. 4.3X3.2X.5cm, according to the bedside nurses' wound care flow sheet.  Pressure Injury POA: Yes   Left posterior thigh with full thickness wound; red-yellow and moist, mod amt yellow drainage    Left leg fold is red, moist and macerated with partial thickness skin loss and mod amt yellow drainage; appearance is consistent with moisture associated skin damage.      Topical treatment orders provided for bedside nurses to perform as follows to absorb drainage and provide antimicrobial benefits:  1. Apply a piece of Aquacel Soila # [phone]) packing to sacrum Q day, using swab to fill, then cover with foam dressing.  Change foam dressing Q 3 days or PRN soiling.  2. Apply a flat piece of Aquacel Soila # [phone]) to left posterior thigh and left leg fold and cover with foam dressing. Change both dressings Q 3 days or PRN soiling  Please re-consult if further assistance is needed.  Thank-you,  Stephane Fought MSN, RN, CWOCN, CWCN-AP, CNS Contact Mon-Fri 0700-1500: [phone]

## 2024-10-06 NOTE — Assessment & Plan Note (Addendum)
 Pleural Effusion  Noted recent admission July 2025 with respiratory failure requiring supplemental oxygen and partial lung collapse-was discharged on room air Coming in with O2 sats in the 80s on 6 L-transition to 10 L high flow nasal cannula to keep O2 sats greater than 95% Unclear general baseline Noted concurrent sepsis as well as left-sided pleural effusion and atelectasis on chest x-ray Given presentation with sepsis, tachycardia and hypoxia, will check D-dimer x 1 Noted baseline contraindication to anticoagulation in setting of history of perforated duodenal ulcer Consider further imaging of the chest including CT of the chest to better assess IV aztreonam, Flagyl , vancomycin  for infectious coverage in the interim Blood and respiratory cultures Consider thoracentesis for pleural effusion as appropriate

## 2024-10-06 NOTE — Assessment & Plan Note (Signed)
 Cr 1 today with GFR of 60  Monitor

## 2024-10-06 NOTE — Assessment & Plan Note (Signed)
 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

## 2024-10-06 NOTE — Assessment & Plan Note (Signed)
 Noted decompensated respiratory status in the setting of sepsis Concurrent pleural effusion as well as left lower lobe atelectasis on imaging today Does not appear to be an active COPD exacerbation at present Will defer steroids and treatment pathway for now As needed DuoNeb On antibiotic coverage  in the setting of sepsis Monitor

## 2024-10-06 NOTE — Progress Notes (Signed)
 Peripherally Inserted Central Catheter Placement  The IV Nurse has discussed with the patient and/or persons authorized to consent for the patient, the purpose of this procedure and the potential benefits and risks involved with this procedure.  The benefits include less needle sticks, lab draws from the catheter, and the patient may be discharged home with the catheter. Risks include, but not limited to, infection, bleeding, blood clot (thrombus formation), and puncture of an artery; nerve damage and irregular heartbeat and possibility to perform a PICC exchange if needed/ordered by physician.  Alternatives to this procedure were also discussed.  Bard Power PICC patient education guide, fact sheet on infection prevention and patient information card has been provided to patient /or left at bedside.    PICC Placement Documentation  PICC Double Lumen 10/06/24 Right Cephalic 36 cm 1 cm (Active)  Indication for Insertion or Continuance of Line Vasoactive infusions 10/06/24 1848  Exposed Catheter (cm) 1 cm 10/06/24 1848  Site Assessment Clean, Dry, Intact 10/06/24 1848  Lumen #1 Status Flushed;Blood return noted 10/06/24 1848  Lumen #2 Status Flushed;Blood return noted;Saline locked 10/06/24 1848  Dressing Type Transparent 10/06/24 1848  Dressing Status Antimicrobial disc/dressing in place 10/06/24 1848  Line Care Connections checked and tightened 10/06/24 1848  Line Adjustment (NICU/IV Team Only) No 10/06/24 1848  Dressing Intervention New dressing 10/06/24 1848  Dressing Change Due 10/13/24 10/06/24 1848       Christina Castaneda 10/06/2024, 6:56 PM

## 2024-10-06 NOTE — Assessment & Plan Note (Signed)
 Mild generalized lethargy and confusion on presentation Suspect likely multifactorial with contributions of toxic metabolic etiologies as well as concurrent sepsis Grossly nonfocal neuroexam Blood gas with mild metabolic alkalosis Continue with sepsis treatment Check ammonia level Consider CT head as appropriate Otherwise follow-up

## 2024-10-06 NOTE — Assessment & Plan Note (Signed)
 -  CPAP prn

## 2024-10-06 NOTE — Assessment & Plan Note (Signed)
 On dysphagia 3 diet from 06/2024 admission  Continue

## 2024-10-06 NOTE — Assessment & Plan Note (Signed)
 Meeting sepsis criteria based on HR 100s, WBC 20.6  Currently with unknown source at present  Lactate WNL  UA pending  CXR w/ noted moderate L sided pleural effusion and lower lobe atelectasis  Pancultured in the ER  Will place on broad spectrum antibiotics including aztreonam, flagyl  and vancomycin  for infectious coverage in the interim  Judicious IVF in setting of pleural effusion

## 2024-10-06 NOTE — Evaluation (Signed)
 Occupational Therapy Evaluation Patient Details Name: Christina Castaneda MRN: [id number] DOB: 10-10-1956 Today's Date: 10/06/2024   History of Present Illness   Christina Castaneda is a 68 y.o. female with medical history significant of perforated sigmoid colon status post hamartoma, essential hypertension, history of hemorrhagic shock secondary to perforated duodenal ulcer status post operative repair and embolization for recurrent bleeding July 2025, COPD, obesity, sleep apnea, stage II CKD presenting with encephalopathy and sepsis.  Limited history in the setting of encephalopathy.  Per report, patient brought in from the [facility] secondary to fever and confusion.  Patient is known to be on multiple antibiotics but unclear for exactly what infection.  Patient reports mild shortness of breath.  No reports of chest pain or cough.  No reports of abdominal pain.  Usually on 3 L nasal cannula at baseline per report.  Patient noted had a very complicated admission during June through July of this year.  Issues during the admission including acute respiratory failure with noted lung collapse and mucous plugging as well as duodenal ulcer requiring emergent surgical evaluation as well as embolization for bleeding postoperatively.  No reports of black or bloody stools.  Have follow-up with general surgery roughly 2 months ago with otherwise normal follow-up and plan for repeat EGD outpatient.  Presented to the ER Tmax of 100, heart rate 100s, BP 70s to 120 systolic, satting 88% on 6 L, transition to 10 L high flow nasal cannula to keep O2 sats greater than 95%.  White count 20.6, hemoglobin 10.3, platelets 468, creatinine 1.01, sodium 130, lactate within normal limits.  Blood gas with a mild metabolic alkalosis.  Chest x-ray with moderate left-sided pleural effusion and left lower lobe atelectasis. (per MD)     Clinical Impressions Pt agreeable to OT and PT co-evaluation. Pt reports she has been at rehab but does  not desire to be there long term. Pt reports she has not sat at the edge of the bed in some time. Pt could not recall the last time she did sit at the bedside. Today she was limited to rolling to L side only. Pt demonstrated WFL A/ROM of B UE while supine indicating at least some functional use of B UE is possible. Pt reports she helps with bathing and dressing at the SNF. She can also complete grooming tasks and feeding at the SNF per her report. Pt left in the bed with call bell within reach. Pt will benefit from continued OT in the hospital to increase strength, balance, and endurance for safe ADL's.        If plan is discharge home, recommend the following:   Two people to help with walking and/or transfers;A lot of help with bathing/dressing/bathroom;Assistance with cooking/housework;Assist for transportation;Help with stairs or ramp for entrance     Functional Status Assessment   Patient has had a recent decline in their functional status and demonstrates the ability to make significant improvements in function in a reasonable and predictable amount of time.     Equipment Recommendations   None recommended by OT             Precautions/Restrictions   Precautions Precautions: Fall Recall of Precautions/Restrictions: Intact Restrictions Weight Bearing Restrictions Per Provider Order: No     Mobility Bed Mobility Overal bed mobility: Needs Assistance Bed Mobility: Rolling Rolling: Mod assist, Max assist, Used rails         General bed mobility comments: Rolling to L side; assist to lift R LE ;  pt able to grasp R rail and roll with assist.    Transfers                   General transfer comment: Pt has not beed transfering at SNF per her report.      Balance Overall balance assessment:  (Only a supine bed level evaluation today. Pt has not sat at EOB in some time.)                                         ADL either performed or  assessed with clinical judgement   ADL Overall ADL's : Needs assistance/impaired Eating/Feeding: Set up;Bed level   Grooming: Set up;Minimal assistance;Bed level   Upper Body Bathing: Moderate assistance;Bed level;Maximal assistance   Lower Body Bathing: Total assistance;Bed level   Upper Body Dressing : Moderate assistance;Maximal assistance;Bed level   Lower Body Dressing: Total assistance;Bed level   Toilet Transfer: Total assistance   Toileting- Clothing Manipulation and Hygiene: Total assistance;Bed level               Vision Baseline Vision/History: 1 Wears glasses Ability to See in Adequate Light: 1 Impaired Patient Visual Report: No change from baseline Vision Assessment?: No apparent visual deficits     Perception Perception: Not tested       Praxis Praxis: Not tested       Pertinent Vitals/Pain Pain Assessment Pain Assessment: 0-10 Pain Score: 8  Pain Location: gluteal region Pain Descriptors / Indicators: Discomfort, Grimacing Pain Intervention(s): Limited activity within patient's tolerance, Monitored during session, Repositioned     Extremity/Trunk Assessment Upper Extremity Assessment Upper Extremity Assessment: Generalized weakness (Full A/ROM while in supine. Limited to bed level testing.)   Lower Extremity Assessment Lower Extremity Assessment: Defer to PT evaluation       Communication Communication Communication: No apparent difficulties   Cognition Arousal: Alert Behavior During Therapy: WFL for tasks assessed/performed Cognition: No apparent impairments                               Following commands: Intact       Cueing  General Comments   Cueing Techniques: Verbal cues;Tactile cues                 Home Living Family/patient expects to be discharged to:: Skilled nursing facility                                 Additional Comments: Pt reports she hopes to return home at some point.       Prior Functioning/Environment Prior Level of Function : Needs assist       Physical Assist : ADLs (physical);Mobility (physical) Mobility (physical): Bed mobility;Gait;Transfers;Stairs ADLs (physical): Bathing;Dressing;Toileting;IADLs Mobility Comments: Pt could not recall the last time she sat at EOB. Reports only rolling in bed for mobility as of late. ADLs Comments: Assisted for bathing, dressing, and toileting. Pt reports she can particpate and help some with dressingand bathing. Able to do grooming and feeding. Assisted by SNF staff.    OT Problem List: Decreased strength;Decreased activity tolerance;Impaired balance (sitting and/or standing);Obesity;Pain   OT Treatment/Interventions: Self-care/ADL training;Therapeutic activities;Patient/family education;Balance training;DME and/or AE instruction;Therapeutic exercise      OT Goals(Current goals can be found in the care plan  section)   Acute Rehab OT Goals Patient Stated Goal: return home OT Goal Formulation: With patient Time For Goal Achievement: 10/20/24 Potential to Achieve Goals: Fair   OT Frequency:  Min 2X/week    Co-evaluation PT/OT/SLP Co-Evaluation/Treatment: Yes Reason for Co-Treatment: To address functional/ADL transfers   OT goals addressed during session: ADL's and self-care                       End of Session    Activity Tolerance: Patient tolerated treatment well Patient left: in bed;with call bell/phone within reach  OT Visit Diagnosis: Unsteadiness on feet (R26.81);Other abnormalities of gait and mobility (R26.89);Muscle weakness (generalized) (M62.81)                Time: 8961-8953 OT Time Calculation (min): 8 min Charges:  OT General Charges $OT Visit: 1 Visit OT Evaluation $OT Eval Low Complexity: 1 Low  Samir Ishaq OT, MOT  Jayson Person 10/06/2024, 1:29 PM

## 2024-10-06 NOTE — Sepsis Progress Note (Signed)
 Following for sepsis monitoring ?

## 2024-10-06 NOTE — Evaluation (Signed)
 Physical Therapy Evaluation Patient Details Name: Christina Castaneda MRN: [id number] DOB: 1956-03-09 Today's Date: 10/06/2024  History of Present Illness  Christina Castaneda is a 68 y.o. female with medical history significant of perforated sigmoid colon status post hamartoma, essential hypertension, history of hemorrhagic shock secondary to perforated duodenal ulcer status post operative repair and embolization for recurrent bleeding July 2025, COPD, obesity, sleep apnea, stage II CKD presenting with encephalopathy and sepsis.  Limited history in the setting of encephalopathy.  Per report, patient brought in from the [facility] secondary to fever and confusion.  Patient is known to be on multiple antibiotics but unclear for exactly what infection.  Patient reports mild shortness of breath.  No reports of chest pain or cough.  No reports of abdominal pain.  Usually on 3 L nasal cannula at baseline per report.  Patient noted had a very complicated admission during June through July of this year.  Issues during the admission including acute respiratory failure with noted lung collapse and mucous plugging as well as duodenal ulcer requiring emergent surgical evaluation as well as embolization for bleeding postoperatively.  No reports of black or bloody stools.  Have follow-up with general surgery roughly 2 months ago with otherwise normal follow-up and plan for repeat EGD outpatient.  Presented to the ER Tmax of 100, heart rate 100s, BP 70s to 120 systolic, satting 88% on 6 L, transition to 10 L high flow nasal cannula to keep O2 sats greater than 95%.  White count 20.6, hemoglobin 10.3, platelets 468, creatinine 1.01, sodium 130, lactate within normal limits.  Blood gas with a mild metabolic alkalosis.  Chest x-ray with moderate left-sided pleural effusion and left lower lobe atelectasis.   Clinical Impression  Patient demonstrates slow labored movement for rolling to side and unable to attempt sitting up at  bedside due to c/o severe pain in LLE and fatigue. Patient will benefit from continued skilled physical therapy in hospital and recommended venue below to increase strength, balance, endurance for safe ADLs and gait.       If plan is discharge home, recommend the following: A lot of help with bathing/dressing/bathroom;A lot of help with walking and/or transfers;Help with stairs or ramp for entrance;Assist for transportation;Assistance with cooking/housework   Can travel by private vehicle   No    Equipment Recommendations None recommended by PT  Recommendations for Other Services       Functional Status Assessment Patient has had a recent decline in their functional status and demonstrates the ability to make significant improvements in function in a reasonable and predictable amount of time.     Precautions / Restrictions Precautions Precautions: Fall Recall of Precautions/Restrictions: Intact Restrictions Weight Bearing Restrictions Per Provider Order: No      Mobility  Bed Mobility Overal bed mobility: Needs Assistance Bed Mobility: Rolling Rolling: Mod assist, Max assist, Used rails         General bed mobility comments: Rolling to L side; assist to lift R LE ; pt able to grasp R rail and roll with assist.    Transfers                        Ambulation/Gait                  Stairs            Wheelchair Mobility     Tilt Bed    Modified Rankin (Stroke Patients Only)  Balance                                             Pertinent Vitals/Pain Pain Assessment Pain Assessment: 0-10 Pain Score: 8  Pain Location: gluteal region Pain Descriptors / Indicators: Discomfort, Grimacing Pain Intervention(s): Limited activity within patient's tolerance, Monitored during session, Repositioned    Home Living Family/patient expects to be discharged to:: Private residence Living Arrangements: Spouse/significant  other Available Help at Discharge: Family;Available PRN/intermittently Type of Home: House Home Access: Ramped entrance       Home Layout: One level Home Equipment: Agricultural consultant (2 wheels);Cane - single point;Shower seat;BSC/3in1 Additional Comments: Pt reports she hopes to return home at some point.    Prior Function Prior Level of Function : Needs assist       Physical Assist : ADLs (physical);Mobility (physical) Mobility (physical): Bed mobility;Gait;Transfers;Stairs ADLs (physical): Bathing;Dressing;Toileting;IADLs Mobility Comments: [place] ambulation using RW or SPC early in year, recently unable to stand or ambulate with SNF staff ADLs Comments: Assisted for bathing, dressing, and toileting. Pt reports she can particpate and help some with dressingand bathing. Able to do grooming and feeding. Assisted by SNF staff.     Extremity/Trunk Assessment   Upper Extremity Assessment Upper Extremity Assessment: Defer to OT evaluation    Lower Extremity Assessment Lower Extremity Assessment: Generalized weakness    Cervical / Trunk Assessment Cervical / Trunk Assessment: Normal  Communication   Communication Communication: No apparent difficulties    Cognition Arousal: Alert Behavior During Therapy: WFL for tasks assessed/performed, Anxious                             Following commands: Intact       Cueing Cueing Techniques: Verbal cues, Tactile cues     General Comments      Exercises     Assessment/Plan    PT Assessment Patient needs continued PT services  PT Problem List Decreased strength;Decreased activity tolerance;Decreased balance;Decreased mobility;Pain       PT Treatment Interventions Gait training;DME instruction;Stair training;Functional mobility training;Therapeutic activities;Therapeutic exercise;Balance training;Patient/family education    PT Goals (Current goals can be found in the Care Plan section)  Acute Rehab PT  Goals Patient Stated Goal: return home after rehab PT Goal Formulation: With patient Time For Goal Achievement: 10/20/24 Potential to Achieve Goals: Fair    Frequency Min 2X/week     Co-evaluation PT/OT/SLP Co-Evaluation/Treatment: Yes Reason for Co-Treatment: To address functional/ADL transfers PT goals addressed during session: Mobility/safety with mobility;Balance OT goals addressed during session: ADL's and self-care       AM-PAC PT 6 Clicks Mobility  Outcome Measure Help needed turning from your back to your side while in a flat bed without using bedrails?: A Lot Help needed moving from lying on your back to sitting on the side of a flat bed without using bedrails?: A Lot Help needed moving to and from a bed to a chair (including a wheelchair)?: Total Help needed standing up from a chair using your arms (e.g., wheelchair or bedside chair)?: Total Help needed to walk in hospital room?: Total Help needed climbing 3-5 steps with a railing? : Total 6 Click Score: 8    End of Session   Activity Tolerance: Patient tolerated treatment well;Patient limited by fatigue;Patient limited by pain Patient left: in bed;with call bell/phone within  reach Nurse Communication: Mobility status PT Visit Diagnosis: Unsteadiness on feet (R26.81);Other abnormalities of gait and mobility (R26.89);Muscle weakness (generalized) (M62.81)    Time: 8964-8954 PT Time Calculation (min) (ACUTE ONLY): 10 min   Charges:   PT Evaluation $PT Eval Low Complexity: 1 Low PT Treatments $Therapeutic Activity: 8-22 mins PT General Charges $$ ACUTE PT VISIT: 1 Visit         2:34 PM, 10/06/24 Lynwood Music, MPT Physical Therapist with [facility] 336 [phone] office [phone] mobile phone

## 2024-10-06 NOTE — Assessment & Plan Note (Addendum)
 LLN BP  Hold BP regimen for now

## 2024-10-06 NOTE — ED Triage Notes (Signed)
 Pt BIB [facility] from [facility] for AMS and fever. Pt currently on 2 different abx but they were unsure for what infection. Pt normally on 3L [place], pt arrives on 6L [place] with sats of 88%.

## 2024-10-06 NOTE — H&P (Signed)
 History and Physical    Patient: Christina Castaneda FMW:[id number] DOB: 1956-08-16 DOA: 10/05/2024 DOS: the patient was seen and examined on 10/06/2024 PCP: [name], DO  Patient coming from: [facility]  Chief Complaint:  Chief Complaint  Patient presents with   Altered Mental Status   HPI: Christina Castaneda is a 68 y.o. female with medical history significant of perforated sigmoid colon status post hamartoma, essential hypertension, history of hemorrhagic shock secondary to perforated duodenal ulcer status post operative repair and embolization for recurrent bleeding July 2025, COPD, obesity, sleep apnea, stage II CKD presenting with encephalopathy and sepsis.  Limited history in the setting of encephalopathy.  Per report, patient brought in from the [facility] secondary to fever and confusion.  Patient is known to be on multiple antibiotics but unclear for exactly what infection.  Patient reports mild shortness of breath.  No reports of chest pain or cough.  No reports of abdominal pain.  Usually on 3 L nasal cannula at baseline per report.  Patient noted had a very complicated admission during June through July of this year.  Issues during the admission including acute respiratory failure with noted lung collapse and mucous plugging as well as duodenal ulcer requiring emergent surgical evaluation as well as embolization for bleeding postoperatively.  No reports of black or bloody stools.  Have follow-up with general surgery roughly 2 months ago with otherwise normal follow-up and plan for repeat EGD outpatient. Presented to the ER Tmax of 100, heart rate 100s, BP 70s to 120 systolic, satting 88% on 6 L, transition to 10 L high flow nasal cannula to keep O2 sats greater than 95%.  White count 20.6, hemoglobin 10.3, platelets 468, creatinine 1.01, sodium 130, lactate within normal limits.  Blood gas with a mild metabolic alkalosis.  Chest x-ray with moderate left-sided pleural effusion and  left lower lobe atelectasis. Review of Systems: As mentioned in the history of present illness. All other systems reviewed and are negative. Past Medical History:  Diagnosis Date   Asthma    mild   Atypical endometrial hyperplasia    COPD (chronic obstructive pulmonary disease) (HCC)    Goiter    Gout    Heart murmur    Hypertension    Hypothyroidism    Vitamin B12 deficiency    Yeast infection    for last 3 weeks   Past Surgical History:  Procedure Laterality Date   ABDOMINAL WALL DEFECT REPAIR N/A 04/18/2016   Procedure: CLOSURE OF ABDOMEN;  Surgeon: Debby Shipper, MD;  Location: [facility] OR;  Service: General;  Laterality: N/A;   APPENDECTOMY     APPLICATION OF WOUND VAC N/A 04/02/2016   Procedure: application of wound vac+;  Surgeon: Camellia Blush, MD;  Location: [facility] OR;  Service: General;  Laterality: N/A;   CHOLECYSTECTOMY  2009   gallstone removed   COLOSTOMY N/A 03/30/2016   Procedure: COLOSTOMY;  Surgeon: Donnice Bury, MD;  Location: [facility] OR;  Service: General;  Laterality: N/A;   COLOSTOMY REVISION N/A 03/28/2016   Procedure: COLOSTOMY REVISION;  Surgeon: Lynwood Pina, MD;  Location: [facility] OR;  Service: General;  Laterality: N/A;   COLOSTOMY REVISION N/A 03/30/2016   Procedure: COLON RESECTION LEFT;  Surgeon: Donnice Bury, MD;  Location: [facility] OR;  Service: General;  Laterality: N/A;   FEMUR IM NAIL Left 06/12/2024   Procedure: INSERTION, INTRAMEDULLARY ROD, FEMUR, RETROGRADE;  Surgeon: Onesimo Oneil LABOR, MD;  Location: [facility] ORS;  Service: Orthopedics;  Laterality: Left;  IR ANGIOGRAM VISCERAL SELECTIVE  06/25/2024   IR ANGIOGRAM VISCERAL SELECTIVE  06/25/2024   IR ANGIOGRAM VISCERAL SELECTIVE  06/25/2024   IR EMBO ART  VEN HEMORR LYMPH EXTRAV  INC GUIDE ROADMAPPING  06/25/2024   IR US  GUIDE VASC ACCESS RIGHT  06/25/2024   LAPAROTOMY N/A 03/28/2016   Procedure: EXPLORATORY LAPAROTOMY;  Surgeon: Lynwood Pina, MD;  Location: [facility] OR;  Service: General;  Laterality: N/A;   LAPAROTOMY N/A 04/02/2016    Procedure: Re-exploration of open abdomen, application of abdominal wound vac;  Surgeon: Camellia Blush, MD;  Location: [facility] OR;  Service: General;  Laterality: N/A;   LAPAROTOMY N/A 04/04/2016   Procedure: EXPLORATORY LAPAROTOMY, PLACEMENT OF ABRA ABDOMINAL WALL CLOSURE SET ;  Surgeon: Camellia Blush, MD;  Location: [facility] OR;  Service: General;  Laterality: N/A;   LAPAROTOMY N/A 04/18/2016   Procedure: EXPLORATORY LAPAROTOMY;  Surgeon: Debby Shipper, MD;  Location: [facility] OR;  Service: General;  Laterality: N/A;   LAPAROTOMY N/A 06/15/2024   Procedure: LAPAROTOMY, EXPLORATORY OMENTAL GRAHAM PATCH REPAIR.;  Surgeon: Evonnie Dorothyann LABOR, DO;  Location: [facility] ORS;  Service: General;  Laterality: N/A;   LYSIS OF ADHESION N/A 06/15/2024   Procedure: LAPAROTOMY, FOR LYSIS OF ADHESIONS;  Surgeon: Evonnie Dorothyann LABOR, DO;  Location: [facility];  Service: General;  Laterality: N/A;   NECK SURGERY  1994   repair disk   OMENTECTOMY N/A 03/28/2016   Procedure: OMENTECTOMY;  Surgeon: Lynwood Pina, MD;  Location: [facility] OR;  Service: General;  Laterality: N/A;   ROBOTIC ASSISTED TOTAL HYSTERECTOMY WITH BILATERAL SALPINGO OOPHERECTOMY  11/18/2012   Procedure: ROBOTIC ASSISTED TOTAL HYSTERECTOMY WITH BILATERAL SALPINGO OOPHORECTOMY;  Surgeon: Elenore A. Dodie, MD;  Location: [facility] ORS;  Service: Gynecology;  Laterality: N/A;   TONSILLECTOMY     as a child   VACUUM ASSISTED CLOSURE CHANGE N/A 03/30/2016   Procedure: ABDOMINAL VAC CHANGE;  Surgeon: Donnice Bury, MD;  Location: [facility] OR;  Service: General;  Laterality: N/A;   WOUND DEBRIDEMENT N/A 03/28/2016   Procedure: DEBRIDEMENT WOUND;  Surgeon: Lynwood Pina, MD;  Location: [facility] OR;  Service: General;  Laterality: N/A;   Social History:  reports that she has been smoking cigarettes. She has a 42 pack-year smoking history. She has never used smokeless tobacco. She reports current alcohol use of about 1.0 - 3.0 standard drink of alcohol per week. She reports that she does not use  drugs.  Allergies  Allergen Reactions   Penicillins Anaphylaxis, Hives, Shortness Of Breath and Swelling    Pt given ceftriaxone  05/27/24 in ED and tolerated   Biphosphate Other (See Comments)    Aching joints   Fluticasone  Other (See Comments)    Headaches    Family History  Problem Relation Age of Onset   COPD Mother    Diabetes Mother    Congestive Heart Failure Mother    Diabetes Father    Heart attack Father    Colon cancer Neg Hx    Colon polyps Neg Hx     Prior to Admission medications   Medication Sig Start Date End Date Taking? Authorizing Provider  acetaminophen  (TYLENOL ) 500 MG tablet Take 1,000 mg by mouth every 6 (six) hours.    [provider]  albuterol  (VENTOLIN  HFA) 108 (90 Base) MCG/ACT inhaler Inhale 2 puffs into the lungs every 6 (six) hours as needed. Wheezing and shortness of breath 05/31/24 07/30/24  [name] LABOR, MD  allopurinol  (ZYLOPRIM ) 300 MG tablet Take 300 mg by mouth daily.  [provider]  atorvastatin (LIPITOR) 20 MG tablet Take 20 mg by mouth daily.    [provider]  Cyanocobalamin (VITAMIN B-12 CR PO) Take 1 tablet by mouth daily.    [provider]  Ergocalciferol  50 MCG (2000 UT) TABS Take 2,000 Units by mouth daily.    [provider]  fluticasone -salmeterol (ADVAIR) 500-50 MCG/ACT AEPB Inhale 1 puff into the lungs in the morning and at bedtime. 05/31/24 07/29/24  [name] LABOR, MD  gabapentin  (NEURONTIN ) 300 MG capsule Take 1-2 capsules by mouth at bedtime. 02/27/21   [provider]  levothyroxine  (SYNTHROID ) 137 MCG tablet Take 137 mcg by mouth daily.    [provider]  nystatin  (MYCOSTATIN /NYSTOP ) powder Apply topically 3 (three) times daily. 05/31/24   Shahmehdi, Adriana LABOR, MD  pantoprazole  (PROTONIX ) 40 MG tablet Take 1 tablet (40 mg total) by mouth 2 (two) times daily. 07/10/24   Odell Celinda Balo, MD  sucralfate  (CARAFATE ) 1 GM/10ML suspension Take 10 mLs (1 g total) by  mouth 4 (four) times daily -  with meals and at bedtime. 07/10/24   Odell Celinda Balo, MD    Physical Exam: Vitals:   10/06/24 0600 10/06/24 0620 10/06/24 0625 10/06/24 0739  BP: (!) 169/107     Pulse: (!) 113 (!) 112 (!) 110   Resp: (!) 25 (!) 24 19   Temp:    99.1 F (37.3 C)  TempSrc:    Oral  SpO2: 98% 97% 97%   Weight:      Height:       Physical Exam Constitutional:      Appearance: She is obese.     Comments: Lethargic    HENT:     Head: Atraumatic.     Nose: Nose normal.  Cardiovascular:     Rate and Rhythm: Normal rate.  Pulmonary:     Effort: Pulmonary effort is normal.  Musculoskeletal:     Comments: + generalized weakness   Skin:    Comments: Noted sacral ulcers    Neurological:     General: No focal deficit present.     Comments: + generalized weakness   Psychiatric:        Mood and Affect: Mood normal.     Data Reviewed:  There are no new results to review at this time.  DG Chest Port 1 View CLINICAL DATA:  Questionable sepsis - evaluate for abnormality  EXAM: PORTABLE CHEST 1 VIEW  COMPARISON:  07/08/2024  FINDINGS: Moderate left pleural effusion, slightly decreased since prior study. Left lower lobe opacity, likely atelectasis. Right lung clear. Heart normal size. No acute bony abnormality.  IMPRESSION: Moderate left pleural effusion with left lower lobe atelectasis.  Electronically Signed   By: Franky Crease M.D.   On: 10/06/2024 00:25  Lab Results  Component Value Date   WBC 24.2 (H) 10/06/2024   HGB 9.4 (L) 10/06/2024   HCT 30.7 (L) 10/06/2024   MCV 89.8 10/06/2024   PLT 445 (H) 10/06/2024   Last metabolic panel Lab Results  Component Value Date   GLUCOSE 117 (H) 10/06/2024   NA 130 (L) 10/06/2024   K 3.4 (L) 10/06/2024   CL 90 (L) 10/06/2024   CO2 28 10/06/2024   BUN 22 10/06/2024   CREATININE 1.21 (H) 10/06/2024   GFRNONAA 48 (L) 10/06/2024   CALCIUM  9.7 10/06/2024   PHOS 2.6 07/07/2024   PROT 7.1 10/05/2024    ALBUMIN  2.5 (L) 10/05/2024   BILITOT 0.3 10/05/2024  ALKPHOS 96 10/05/2024   AST 30 10/05/2024   ALT 7 10/05/2024   ANIONGAP 11 10/06/2024    Assessment and Plan: * Sepsis (HCC) Meeting sepsis criteria based on HR 100s, WBC 20.6  Currently with unknown source at present  Lactate WNL  UA pending  CXR w/ noted moderate L sided pleural effusion and lower lobe atelectasis  Pancultured in the ER  Will place on broad spectrum antibiotics including aztreonam, flagyl  and vancomycin  for infectious coverage in the interim  Judicious IVF in setting of pleural effusion       Acute on chronic respiratory failure with hypoxia (HCC) Pleural Effusion  Noted recent admission July 2025 with respiratory failure requiring supplemental oxygen and partial lung collapse-was discharged on room air Coming in with O2 sats in the 80s on 6 L-transition to 10 L high flow nasal cannula to keep O2 sats greater than 95% Unclear general baseline Noted concurrent sepsis as well as left-sided pleural effusion and atelectasis on chest x-ray Given presentation with sepsis, tachycardia and hypoxia, will check D-dimer x 1 Noted baseline contraindication to anticoagulation in setting of history of perforated duodenal ulcer Consider further imaging of the chest including CT of the chest to better assess IV aztreonam, Flagyl , vancomycin  for infectious coverage in the interim Blood and respiratory cultures Consider thoracentesis for pleural effusion as appropriate   Encephalopathy Mild generalized lethargy and confusion on presentation Suspect likely multifactorial with contributions of toxic metabolic etiologies as well as concurrent sepsis Grossly nonfocal neuroexam Blood gas with mild metabolic alkalosis Continue with sepsis treatment Check ammonia level Consider CT head as appropriate Otherwise follow-up   Pressure injury of skin Noted stage 3-4 sacral ulcer on evaluation  Will consult wound care   Will also check soft tissue imaging to rule as potential source of sepsis  Follow closely   COPD  GOLD 2/AB Noted decompensated respiratory status in the setting of sepsis Concurrent pleural effusion as well as left lower lobe atelectasis on imaging today Does not appear to be an active COPD exacerbation at present Will defer steroids and treatment pathway for now As needed DuoNeb On antibiotic coverage  in the setting of sepsis Monitor     Stage 3 chronic kidney disease (HCC) Cr 1 today with GFR of 60  Monitor   Perforated bowel (HCC) Noted history of perforated duodenal ulcer during admission 05/2024-06/2024  S/p general  surgery on 06/15/2024 and GDA embolization  06/25/2024 No reports of active bleeding at present  Hgb stable at 10.3 (baseline)  Hold AC given history  IV PPI  Monitor  OSA (obstructive sleep apnea) CPAP prn   Dysphagia On dysphagia 3 diet from 06/2024 admission  Continue   Essential hypertension BP stable  Titrate home regimen        Advance Care Planning:   Code Status: Full Code   Consults: Palliative given age, functional status, overall condition and comorbidities   Family Communication: No family at the bedside   Severity of Illness: The appropriate patient status for this patient is OBSERVATION. Observation status is judged to be reasonable and necessary in order to provide the required intensity of service to ensure the patient's safety. The patient's presenting symptoms, physical exam findings, and initial radiographic and laboratory data in the context of their medical condition is felt to place them at decreased risk for further clinical deterioration. Furthermore, it is anticipated that the patient will be medically stable for discharge from the hospital within 2 midnights of admission.  Author: Elspeth JINNY Masters, MD 10/06/2024 10:05 AM  For on call review [email or web address].

## 2024-10-06 NOTE — Progress Notes (Signed)
 Pharmacy Antibiotic Note  Christina Castaneda is a 68 y.o. female admitted on 10/05/2024 with altered mental status.  Pharmacy has been consulted for vancomycin  and aztreonam dosing.  Patient with tmax of 100, wbc elevated at 24. Received initial antibiotics overnight. Patient with noted allergy  to pcn but has tolerated ancef , ceftriaxone , and zosyn  previously; will change aztreonam to cefepime and follow closely.   Vancomycin  1 g IV Q 24 hrs. Goal AUC 400-550. Expected AUC: 504 SCr used: 1.2 Cefepime 2g q12 hours  Height: 5' 5 (165.1 cm) Weight: 108 kg (238 lb 1.6 oz) IBW/kg (Calculated) : 57  Temp (24hrs), Avg:98.9 F (37.2 C), Min:98.2 F (36.8 C), Max:100 F (37.8 C)  Recent Labs  Lab 10/05/24 2353 10/06/24 0038 10/06/24 0447  WBC 20.6*  --  24.2*  CREATININE 1.01*  --  1.21*  LATICACIDVEN 1.7 1.7  --     Estimated Creatinine Clearance: 52.9 mL/min (A) (by C-G formula based on SCr of 1.21 mg/dL (H)).    Allergies  Allergen Reactions   Penicillins Anaphylaxis, Hives, Shortness Of Breath and Swelling    Pt given ceftriaxone  05/27/24 in ED and tolerated   Biphosphate Other (See Comments)    Aching joints   Fluticasone  Other (See Comments)    Headaches    Thank you for allowing pharmacy to be a part of this patient's care.  Dempsey Blush PharmD., BCPS Clinical Pharmacist 10/06/2024 10:42 AM

## 2024-10-06 NOTE — Assessment & Plan Note (Signed)
 Noted history of perforated duodenal ulcer during admission 05/2024-06/2024  S/p general  surgery on 06/15/2024 and GDA embolization  06/25/2024 No reports of active bleeding at present  Hgb stable at 10.3 (baseline)  Hold AC given history  IV PPI  Monitor

## 2024-10-06 NOTE — Plan of Care (Signed)
   Problem: Pain Managment: Goal: General experience of comfort will improve and/or be controlled Outcome: Not Progressing

## 2024-10-06 NOTE — Plan of Care (Signed)
  Problem: Acute Rehab PT Goals(only PT should resolve) Goal: Pt will Roll Supine to Side Outcome: Progressing Flowsheets (Taken 10/06/2024 1437) Pt will Roll Supine to Side: with mod assist Goal: Pt Will Go Supine/Side To Sit Outcome: Progressing Flowsheets (Taken 10/06/2024 1437) Pt will go Supine/Side to Sit: with moderate assist Goal: Pt Will Go Sit To Supine/Side Outcome: Progressing Flowsheets (Taken 10/06/2024 1437) Pt will go Sit to Supine/Side: with moderate assist Goal: Patient Will Perform Sitting Balance Outcome: Progressing Flowsheets (Taken 10/06/2024 1437) Patient will perform sitting balance: with moderate assist Goal: Patient Will Transfer Sit To/From Stand Outcome: Progressing Flowsheets (Taken 10/06/2024 1437) Patient will transfer sit to/from stand: with moderate assist Goal: Pt Will Transfer Bed To Chair/Chair To Bed Outcome: Progressing Flowsheets (Taken 10/06/2024 1437) Pt will Transfer Bed to Chair/Chair to Bed: with mod assist   2:38 PM, 10/06/24 Lynwood Music, MPT Physical Therapist with [facility] [phone] office [phone] mobile phone

## 2024-10-06 NOTE — Plan of Care (Signed)
  Problem: Acute Rehab OT Goals (only OT should resolve) Goal: Pt. Will Perform Grooming Flowsheets (Taken 10/06/2024 1334) Pt Will Perform Grooming:  sitting  with set-up  with contact guard assist Goal: Pt. Will Perform Upper Body Bathing Flowsheets (Taken 10/06/2024 1334) Pt Will Perform Upper Body Bathing:  with contact guard assist  sitting  with min assist Goal: Pt. Will Perform Upper Body Dressing Flowsheets (Taken 10/06/2024 1334) Pt Will Perform Upper Body Dressing:  with contact guard assist  with set-up  sitting Goal: Pt. Will Perform Lower Body Dressing Flowsheets (Taken 10/06/2024 1334) Pt Will Perform Lower Body Dressing:  with mod assist  with adaptive equipment  bed level Goal: Pt. Will Transfer To Toilet Flowsheets (Taken 10/06/2024 1334) Pt Will Transfer to Toilet:  with max assist  stand pivot transfer Goal: Pt. Will Perform Toileting-Clothing Manipulation Flowsheets (Taken 10/06/2024 1334) Pt Will Perform Toileting - Clothing Manipulation and hygiene:  with mod assist  bed level  with max assist Goal: Pt/Caregiver Will Perform Home Exercise Program Flowsheets (Taken 10/06/2024 1334) Pt/caregiver will Perform Home Exercise Program:  Increased strength  Both right and left upper extremity  Independently  Tsuneo Faison OT, MOT

## 2024-10-06 NOTE — Plan of Care (Signed)
  Problem: Pain Managment: Goal: General experience of comfort will improve and/or be controlled Outcome: Progressing   Problem: Safety: Goal: Ability to remain free from injury will improve Outcome: Progressing

## 2024-10-06 NOTE — Plan of Care (Addendum)
 This patient remains on [facility]-CCU as of time of writing. The patient is spontaneously awake and alert, oriented x 4; endorses visual hallucinations of cockroaches climbing into the cracks of the walls. BUE strength 4/5 and equal; BLE strength 2/5 and equal. Severe BLE edema; BLE are elevated and off-loaded. PICC to RUE with levophed  infusion as ordered. The patient is also receiving potassium replacement via PICC. RLQ ostomy. Outstanding orders remain for urinalysis, CT scans. Dr. Charlton notified overnight by this RN of the change in the patient's CXR appearance (per readout); new orders placed for Thoracentesis.   Edit: 2300 hours: Patient transported to CT and back by this RN and Warren, Charity fundraiser. Patient tolerated the transport well; no obvious complications.  Problem: Education: Goal: Knowledge of General Education information will improve Description: Including pain rating scale, medication(s)/side effects and non-pharmacologic comfort measures Outcome: Progressing   Problem: Health Behavior/Discharge Planning: Goal: Ability to manage health-related needs will improve Outcome: Progressing   Problem: Clinical Measurements: Goal: Ability to maintain clinical measurements within normal limits will improve Outcome: Progressing Goal: Will remain free from infection Outcome: Progressing Goal: Diagnostic test results will improve Outcome: Progressing Goal: Respiratory complications will improve Outcome: Progressing Goal: Cardiovascular complication will be avoided Outcome: Progressing   Problem: Activity: Goal: Risk for activity intolerance will decrease Outcome: Progressing   Problem: Nutrition: Goal: Adequate nutrition will be maintained Outcome: Progressing   Problem: Coping: Goal: Level of anxiety will decrease Outcome: Progressing   Problem: Elimination: Goal: Will not experience complications related to bowel motility Outcome: Progressing Goal: Will not experience complications  related to urinary retention Outcome: Progressing   Problem: Pain Managment: Goal: General experience of comfort will improve and/or be controlled Outcome: Progressing   Problem: Safety: Goal: Ability to remain free from injury will improve Outcome: Progressing   Problem: Skin Integrity: Goal: Risk for impaired skin integrity will decrease Outcome: Progressing   Problem: Fluid Volume: Goal: Hemodynamic stability will improve Outcome: Progressing   Problem: Clinical Measurements: Goal: Diagnostic test results will improve Outcome: Progressing Goal: Signs and symptoms of infection will decrease Outcome: Progressing   Problem: Respiratory: Goal: Ability to maintain adequate ventilation will improve Outcome: Progressing   Problem: Education: Goal: Knowledge of disease or condition will improve Outcome: Progressing Goal: Knowledge of the prescribed therapeutic regimen will improve Outcome: Progressing Goal: Individualized Educational Video(s) Outcome: Progressing   Problem: Activity: Goal: Ability to tolerate increased activity will improve Outcome: Progressing Goal: Will verbalize the importance of balancing activity with adequate rest periods Outcome: Progressing   Problem: Respiratory: Goal: Ability to maintain a clear airway will improve Outcome: Progressing Goal: Levels of oxygenation will improve Outcome: Progressing Goal: Ability to maintain adequate ventilation will improve Outcome: Progressing   Problem: Education: Goal: Knowledge of ostomy care will improve Outcome: Progressing Goal: Understanding of discharge needs will improve Outcome: Progressing   Problem: Bowel/Gastric/Urinary: Goal: Gastrointestinal status for postoperative course will improve Outcome: Progressing   Problem: Coping: Goal: Coping ability will improve Outcome: Progressing   Problem: Fluid Volume: Goal: Ability to achieve a balanced intake and output will improve Outcome:  Progressing   Problem: Health Behavior/Discharge Planning: Goal: Ability to manage health-related needs will improve Outcome: Progressing   Problem: Nutrition: Goal: Will attain and maintain optimal nutritional status will improve Outcome: Progressing   Problem: Clinical Measurements: Goal: Postoperative complications will be avoided or minimized Outcome: Progressing   Problem: Skin Integrity: Goal: Will show signs of wound healing Outcome: Progressing Goal: Risk for impaired skin integrity will decrease Outcome:  Progressing

## 2024-10-06 NOTE — Consult Note (Signed)
 Consultation Note Date: 10/06/2024   Patient Name: Christina Castaneda  DOB: [birth year]  MRN: [id number]  Age / Sex: 68 y.o., female  PCP: [name], DO Referring Physician: Eldonna Elspeth PARAS, MD  Reason for Consultation: Establishing goals of care  HPI/Patient Profile: 68 y.o. female  with past medical history of COPD, asthma, atypical endometrial hyperplasia, goiter, gout, hypertension, hypothyroidism, and vitamin B12 deficiency admitted on 10/05/2024 with fever and confusion.   Workup revealed findings concerning for sepsis.  Broad-spectrum antibiotics and aggressive IV hydration initiated origin at present is unclear.  Prior hospital records and outside specialist notes reviewed.  Patient had a prolonged and complicated admission from 06/07/2024 through 07/10/2024 after she sustained a fall with left femur fracture.  Underwent repair on 06/12/2024.  Hospital course was complicated by hemorrhagic shock in the setting of a perforated duodenal ulcer.  Required emergent exploratory laparotomy.  Developed GI bleed for which IR was consulted and status post GDA embolization on 06/25/2024.  This hospital stay was also complicated by acute respiratory failure with hypoxia due to complete left lung collapse secondary to mucous plug for which she was treated with CPT and forced vital capacity as well as hypertonic saline.  She was discharged to skilled nursing facility.  She was seen in outpatient setting by general surgery post exploratory laparotomy procedure.  No concerns were mentioned at that visit.  She was also seen by orthopedic surgery in the outpatient setting and noted to be making improvements.  Here for follow-up as needed.  PMT has been consulted to assist with goals of care conversation.  Today, labs independently reviewed.  Mild hyponatremia noted.  Mild hypokalemia.  Glucose minimally elevated although questionable significance given stress state.  Renal  function trending up.  Would recommend continuing IV fluids and monitoring.  CBC reviewed.  White blood cell count trending up (most recent: 24.2, comparison 20.6), hemoglobin trending down with current 9.4 g/dL and previous 89.6 g/dL.  Platelet count elevated could be reactive.  Would recommend continue to monitor.  Chest x-ray independently reviewed.  Evidence of pleural effusion and opacity to left lower lobe.  Right lung appears clear.  Vital signs reviewed.  Systolic blood pressures in the 80s to 110s.  Mildly febrile.  Tachycardic and tachypneic.  O2 sats being maintained with supplemental O2 at 3 L via high flow nasal cannula.  Medication administration record reviewed.  Patient received total of 2 mg of IV morphine  on 24-hour look back.  Otherwise no symptom meds administered.  On broad-spectrum antibiotics and IV fluids.  Today, patient states she is in quite a bit of pain.  Rates it as an 8 out of 10 to her buttocks.  She states she was given pain medication earlier but it did not help.  She reports some chronic exertional shortness of breath.  Also reports some nausea but denies any vomiting.  Clinical Assessment and Goals of Care:  I have reviewed medical records including EPIC notes, labs and imaging (independently reviewed), prior hospital encounters/specialist notes, MAR, vital signs, assessed the patient and then met with patient to discuss diagnosis prognosis, GOC, EOL wishes, disposition and options. Collaborated directly with attending physician and bedside nursing staff.  Also spoke with patient's husband Nelva Cohron) via phone per her request.  I introduced Palliative Medicine as specialized medical care for people living with serious illness. It focuses on providing relief from the symptoms and stress of a serious illness. The goal is to improve quality of life for  both the patient and the family.  We discussed a brief life review of the patient and then focused on their current  illness.   I attempted to elicit values and goals of care important to the patient.    Medical History Review and Family/Patient Understanding:   With permission, updated patient and her husband regarding our concerns with her current clinical status. Engaged in a detailed conversation with the patient and husband about the seriousness of her current illness, in light of her complex medical history and recent physical/functional declines, and explained the potential implications this may have for her recovery, prognosis, and long-term well-being.  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  They did not have children.  They traveled across the country together and enjoyed doing so until patient had a perforated diverticulitis which required laparotomy, Hartmann procedure, and placement of a colostomy.  She retired after this.  She was living at home with her husband until she sustained the fall and femur fracture in June which led to the cascade of subsequent health issues.  Functional and Nutritional State:  Prior to this hospitalization, patient was at [facility] for rehab since hospital discharge 07/11/2024.  Unfortunately, she has made very little progress with therapy.  She has been bedbound since being admitted there.  Requires assistance for all ADLs other than feeding.  Per husband, she has been in so much pain from the wound on her bottom that she has not been able to participate with therapy.  She has bilateral foot drop.  She reports a fair appetite.  Palliative Symptoms:  Pain, shortness of breath, nausea  Advance Directives/Goals of Care/Anticipatory care planning Discussion:  A detailed discussion regarding GOC, advanced directives, and anticipatory care planning was had with patient in person and husband Nelva Housholder) via phone.  With permission, educated patient on our concerns as a relates to her acute illness in the context of her chronic  comorbidities.  Discussed our concerns that it relates to her long-term prognosis and long-term functional ability.  Elicited patient's goals of care.  Her goal is to get better and eventually go back home with her husband.  She states that she wants to go back to cooking and doing things around the house like she was before.  Patient's husband states that he is also hopeful to bring her home but understands that as we discussed her long-term prognosis for return to significant function is quite poor given prolonged bedbound status, foot drop, and overall burden of disease/debility.  Discussed quality of life.  For her quality of life means being at least somewhat functional.  Patient's husband states at this point he wants her to be able to have good quality of life.  Given goals of care and quality of life, engaged in discussion of advance directives including the limitations and potential burdens of CPR and intubation, particularly in the context of advanced age and serious underlying health conditions. We discussed how the potential consequences of surviving CPR could significantly affect a person's quality of life--particularly if their definition of quality of life involves maintaining independence and functional ability. Patient states she has never considered this before.  She would like to think about this more and speak with her husband about it before making any decisions.  She does ask that I update her husband and get his thoughts.  I spoke with husband regarding this.  He states that if he was in her shoes he would elect to  be DNR/DNI but he does not want to make that decision for her if she is still able to do so.  However, he states if she agrees to DNR/DNI he would absolutely support her wishes in this.  Patient states she does not have a living will or advanced directive but does state that she needs one.  She shares that her husband will be her surrogate decision maker if she was unable to  speak for herself.  For the time being, she would like to continue current plan of care, treat the treatable, and maintain current CODE STATUS.  Discussed the importance of continued conversation with family and the medical providers regarding overall plan of care and treatment options, ensuring decisions are within the context of the patient's values and GOCs.   Questions and concerns were addressed.  Hard Choices booklet left for review. The family was encouraged to call with questions or concerns.  PMT will continue to support holistically.  Primary Decision maker and health care surrogate:  PATIENT, if unable to speak for herself her husband  Code Status:  Full code    SUMMARY OF RECOMMENDATIONS    Full code (continued GOC, husband agreeable to DNR/DNI if wife elects this) Treat the treatable, time for outcomes Will adjust symptom regimen to address intractable pain PT/OT once medically stable to determine mobility status and assist with disposition Palliative medicine team will continue to follow for ongoing goals of care discussion, symptom management, and coordination of care.  Code Status/Advance Care Planning: Full code   Symptom Management:   Discontinue morphine  2 mg IV given suboptimal effect Ordered hydromorphone  0.5 to 1 mg every 3 hours as needed Continue Zofran  4 mg every 6 hours as needed  Prognosis:  Guarded  Discharge Planning: To Be Determined      Primary Diagnoses: Present on Admission:  Sepsis (HCC)  OSA (obstructive sleep apnea)  Stage 3 chronic kidney disease (HCC)  Acute on chronic respiratory failure with hypoxia (HCC)  Essential hypertension  COPD  GOLD 2/AB  Perforated bowel (HCC)    Physical Exam Constitutional:      General: She is not in acute distress.    Comments: Chronically ill-appearing  Pulmonary:     Effort: Pulmonary effort is normal. No respiratory distress.  Skin:    General: Skin is warm and dry.  Neurological:      Comments: Drowsy and oriented to questioning but with some delusional behaviors such as seeing ants that are not there and pills in jelly on the floor     Vital Signs: BP (!) 169/107   Pulse (!) 110   Temp 99.1 F (37.3 C) (Oral)   Resp 19   Ht 5' 5 (1.651 m)   Wt 108 kg   LMP 11/18/2012   SpO2 94%   BMI 39.62 kg/m  Pain Scale: 0-10   Pain Score: 8    SpO2: SpO2: 94 % O2 Device:SpO2: 94 % O2 Flow Rate: .O2 Flow Rate (L/min): 3 L/min   Palliative Assessment/Data: 30%     Billing based on MDM: High  Problems Addressed: One acute or chronic illness or injury that poses a threat to life or bodily function  Amount and/or Complexity of Data: Category 1:Review of prior external note(s) from each unique source, Category 2:Independent interpretation of a test performed by another physician/other qualified health care professional (not separately reported), and Category 3:Discussion of management or test interpretation with external physician/other qualified health care professional/appropriate source (not separately reported)  Risks: Parenteral controlled substances   Bradi Arbuthnot M Stephnie Parlier, NP  Palliative Medicine Team Team phone # [phone]  Thank you for allowing the Palliative Medicine Team to assist in the care of this patient. Please utilize secure chat with additional questions, if there is no response within 30 minutes please call the above phone number.  Palliative Medicine Team providers are available by phone from 7am to 7pm daily and can be reached through the team cell phone.  Should this patient require assistance outside of these hours, please call the patient's attending physician.

## 2024-10-07 ENCOUNTER — Encounter (HOSPITAL_COMMUNITY): Payer: Self-pay | Admitting: Family Medicine

## 2024-10-07 ENCOUNTER — Inpatient Hospital Stay (HOSPITAL_COMMUNITY)

## 2024-10-07 DIAGNOSIS — Z515 Encounter for palliative care: Secondary | ICD-10-CM | POA: Diagnosis not present

## 2024-10-07 DIAGNOSIS — N1831 Chronic kidney disease, stage 3a: Secondary | ICD-10-CM

## 2024-10-07 DIAGNOSIS — Z7189 Other specified counseling: Secondary | ICD-10-CM | POA: Diagnosis not present

## 2024-10-07 DIAGNOSIS — G9341 Metabolic encephalopathy: Secondary | ICD-10-CM | POA: Diagnosis not present

## 2024-10-07 DIAGNOSIS — J9621 Acute and chronic respiratory failure with hypoxia: Secondary | ICD-10-CM

## 2024-10-07 DIAGNOSIS — J449 Chronic obstructive pulmonary disease, unspecified: Secondary | ICD-10-CM | POA: Diagnosis not present

## 2024-10-07 DIAGNOSIS — Z66 Do not resuscitate: Secondary | ICD-10-CM | POA: Diagnosis not present

## 2024-10-07 DIAGNOSIS — Z789 Other specified health status: Secondary | ICD-10-CM | POA: Diagnosis not present

## 2024-10-07 LAB — HEPATIC FUNCTION PANEL
ALT: <5 U/L (ref 0–44)
AST: 17 U/L (ref 15–41)
Albumin: 2.1 g/dL — ABNORMAL LOW (ref 3.5–5.0)
Alkaline Phosphatase: 86 U/L (ref 38–126)
Bilirubin, Direct: 0.2 mg/dL (ref 0.0–0.2)
Indirect Bilirubin: 0.1 mg/dL — ABNORMAL LOW (ref 0.3–0.9)
Total Bilirubin: 0.3 mg/dL (ref 0.0–1.2)
Total Protein: 5.7 g/dL — ABNORMAL LOW (ref 6.5–8.1)

## 2024-10-07 LAB — BODY FLUID CELL COUNT WITH DIFFERENTIAL
Lymphs, Fluid: 1 %
Monocyte-Macrophage-Serous Fluid: 2 % — ABNORMAL LOW (ref 50–90)
Neutrophil Count, Fluid: 97 % — ABNORMAL HIGH (ref 0–25)
Total Nucleated Cell Count, Fluid: 10491 uL — ABNORMAL HIGH (ref 0–1000)

## 2024-10-07 LAB — BASIC METABOLIC PANEL WITH GFR
Anion gap: 7 (ref 5–15)
BUN: 23 mg/dL (ref 8–23)
CO2: 29 mmol/L (ref 22–32)
Calcium: 9.1 mg/dL (ref 8.9–10.3)
Chloride: 93 mmol/L — ABNORMAL LOW (ref 98–111)
Creatinine, Ser: 1.08 mg/dL — ABNORMAL HIGH (ref 0.44–1.00)
GFR, Estimated: 55 mL/min — ABNORMAL LOW (ref >60)
Glucose, Bld: 138 mg/dL — ABNORMAL HIGH (ref 70–99)
Potassium: 4 mmol/L (ref 3.5–5.1)
Sodium: 129 mmol/L — ABNORMAL LOW (ref 135–145)

## 2024-10-07 LAB — CBC
HCT: 26.8 % — ABNORMAL LOW (ref 36.0–46.0)
Hemoglobin: 8.3 g/dL — ABNORMAL LOW (ref 12.0–15.0)
MCH: 28.1 pg (ref 26.0–34.0)
MCHC: 31 g/dL (ref 30.0–36.0)
MCV: 90.8 fL (ref 80.0–100.0)
Platelets: 413 K/uL — ABNORMAL HIGH (ref 150–400)
RBC: 2.95 MIL/uL — ABNORMAL LOW (ref 3.87–5.11)
RDW: 15.9 % — ABNORMAL HIGH (ref 11.5–15.5)
WBC: 25.7 K/uL — ABNORMAL HIGH (ref 4.0–10.5)
nRBC: 0 % (ref 0.0–0.2)

## 2024-10-07 LAB — LACTATE DEHYDROGENASE: LDH: 111 U/L (ref 98–192)

## 2024-10-07 LAB — LACTATE DEHYDROGENASE, PLEURAL OR PERITONEAL FLUID: LD, Fluid: 1047 U/L — ABNORMAL HIGH (ref 3–23)

## 2024-10-07 LAB — GRAM STAIN: Gram Stain: NONE SEEN

## 2024-10-07 LAB — PROTEIN, PLEURAL OR PERITONEAL FLUID: Total protein, fluid: 3.6 g/dL

## 2024-10-07 MED ORDER — LIDOCAINE HCL (PF) 2 % IJ SOLN
10.0000 mL | Freq: Once | INTRAMUSCULAR | Status: AC
Start: 2024-10-07 — End: 2024-10-07
  Administered 2024-10-07: 10 mL
  Filled 2024-10-07: qty 10

## 2024-10-07 MED ORDER — HYDROCORTISONE SOD SUC (PF) 100 MG IJ SOLR: 50.0000 mg | Freq: Four times a day (QID) | INTRAMUSCULAR | Status: DC

## 2024-10-07 MED ORDER — HYDROCORTISONE SOD SUC (PF) 100 MG IJ SOLR
100.0000 mg | Freq: Two times a day (BID) | INTRAMUSCULAR | Status: DC
  Administered 2024-10-07 – 2024-10-09 (×5): 100 mg via INTRAVENOUS
  Filled 2024-10-07 (×7): qty 2

## 2024-10-07 MED ORDER — SODIUM CHLORIDE 0.9 % IV SOLN: INTRAVENOUS | Status: DC

## 2024-10-07 MED ORDER — LIDOCAINE HCL (PF) 2 % IJ SOLN
INTRAMUSCULAR | Status: AC
  Filled 2024-10-07: qty 10

## 2024-10-07 MED ORDER — ORAL CARE MOUTH RINSE
15.0000 mL | OROMUCOSAL | Status: DC | PRN
Start: 2024-10-07 — End: 2024-10-11

## 2024-10-07 MED ORDER — OXYCODONE HCL 5 MG PO TABS
5.0000 mg | ORAL_TABLET | ORAL | Status: DC | PRN
Start: 2024-10-07 — End: 2024-10-11
  Administered 2024-10-07 – 2024-10-09 (×5): 5 mg via ORAL
  Filled 2024-10-07 (×5): qty 1

## 2024-10-07 NOTE — Progress Notes (Addendum)
 Date and time results received: 10/07/24 1810 (use smartphrase .now to insert current time)  Test: blood cultures  Critical Value: positive Aerobic bottle Gram positive rods  Name of Provider Notified: Dr. Rendall Austria  Orders Received? Or Actions Taken?: no new orders at this time.  Currently on IV Antibiotics

## 2024-10-07 NOTE — Progress Notes (Addendum)
 PROGRESS NOTE   Christina Castaneda, is a 68 y.o. female, DOB - [birth year], FMW:[id number]  Admit date - 10/05/2024   Admitting Physician Evalene GORMAN Sprinkles, MD  Outpatient Primary MD for the patient is [name], DO  LOS - 1  Chief Complaint  Patient presents with   Altered Mental Status       Brief Narrative:  68 y.o. female with medical history significant of perforated sigmoid colon status post hamartoma, essential hypertension, history of hemorrhagic shock secondary to perforated duodenal ulcer status post operative repair and embolization for recurrent bleeding July 2025, COPD, obesity, sleep apnea, stage II CKD admitted from SNF facility on 10/06/2024 with severe sepsis septic shock due to empyema with acute hypoxic respiratory failure    -Assessment and Plan: 1)Severe Sepsis with septic shock due to presumed empyema/loculated pleural effusion-POA - Patient met sepsis criteria on admission - patient with loculated pleural effusion with concerns for possible empyema -Pleural fluid LHD 1,047,  serum LDH 111--consistent with exudative pleural effusion by lights criteria -A pleural fluid LDH >1,000 IU/L is also a poor prognostic factor in parapneumonic effusions and empyema, often indicating the need for chest tube drainage --Patient will need CT surgery consult -Cannot rule out underlying malignancy -Lactate WNL  - Continue IV vancomycin  and cefepime as well as Flagyl  AEROBIC BOTTLE ONLY GRAM POSITIVE RODS Gram Stain --await final ID and sensitivity--may be contaminant --Patient underwent diagnostic Thora on 10/07/2024 200 mL removed due to loculated fluid unable to remove large amount of blood fluid--fluid studies still pending --Currently on Levophed  for pressure support due to persistent hypotension - Give hydrocortisone per protocol 50 mg IV every 6 hours for the next 5 days to help accelerate shock reversal and increased vasopressor free days   2)Acute on chronic  respiratory failure with hypoxia (HCC) Pleural Effusion  Noted recent admission July 2025 with respiratory failure requiring supplemental oxygen and partial lung collapse-was discharged on room air Coming in with O2 sats in the 80s on 6 L-transition to 10 L high flow nasal cannula to keep O2 sats greater than 95% Unclear general baseline Noted concurrent sepsis as well as left-sided pleural effusion and atelectasis on chest x-ray Consider further imaging of the chest including CT of the chest to better assess - Antibiotics as above #1  3) metabolic Encephalopathy--due to above Mild generalized lethargy and confusion on presentation Suspect likely multifactorial with contributions of toxic metabolic etiologies as well as concurrent sepsis Grossly nonfocal neuroexam Blood gas with mild metabolic alkalosis -Mentation improving as of 10/07/2024   Pressure injury of skin Noted stage 3-4 sacral ulcer on evaluation  Will consult wound care  Will also check soft tissue imaging to rule as potential source of sepsis  Follow closely   COPD  GOLD 2/AB Noted decompensated respiratory status in the setting of sepsis Concurrent pleural effusion as well as left lower lobe atelectasis on imaging today Does not appear to be an active COPD exacerbation at present -Solu-Cortef as above #1 As needed DuoNeb On antibiotic coverage  in the setting of sepsis and empyema concerns    Stage 3 chronic kidney disease (HCC) Cr 1 today with GFR of 60  Monitor   Perforated bowel (HCC) Noted history of perforated duodenal ulcer during admission 05/2024-06/2024  S/p general  surgery on 06/15/2024 and GDA embolization  06/25/2024 No reports of active bleeding at present  Hgb stable at 10.3 (baseline)  Hold AC given history  IV PPI  Monitor  OSA (obstructive sleep  apnea) CPAP prn and nightly  Dysphagia On dysphagia 3 diet from 06/2024 admission  Continue   Essential hypertension LLN BP  Hold BP regimen  for now   CRITICAL CARE Performed by: Rendall Carwin   Total critical care time: 49  minutes  Critical care time was exclusive of separately billable procedures and treating other patients.  Critical care was necessary to treat or prevent imminent or life-threatening deterioration. - Severe sepsis with septic shock with persistent hypotension despite IV fluids requiring Levophed  for pressure support - Respiratory failure requiring BiPAP/CPAP for pressure support on supplemental oxygen  Critical care was time spent personally by me on the following activities: development of treatment plan with patient and/or surrogate as well as nursing, discussions with consultants, evaluation of patient's response to treatment, examination of patient, obtaining history from patient or surrogate, ordering and performing treatments and interventions, ordering and review of laboratory studies, ordering and review of radiographic studies, pulse oximetry and re-evaluation of patient's condition.   Status is: Inpatient   Disposition: The patient is from: SNF--[facility]              Anticipated d/c is to: SNF              Anticipated d/c date is: > 3 days              Patient currently is not medically stable to d/c. Barriers: Not Clinically Stable-   Code Status :  -  Code Status: Limited: Do not attempt resuscitation (DNR) -DNR-LIMITED -Do Not Intubate/DNI    Family Communication:    NA (patient is alert, awake and coherent)   DVT Prophylaxis  :   - SCDs      Lab Results  Component Value Date   PLT 413 (H) 10/07/2024    Inpatient Medications  Scheduled Meds:  Chlorhexidine  Gluconate Cloth  6 each Topical Daily   pantoprazole  (PROTONIX ) IV  40 mg Intravenous Q24H   sodium chloride  flush  10-40 mL Intracatheter Q12H   Continuous Infusions:  ceFEPime (MAXIPIME) IV Stopped (10/07/24 1051)   metronidazole  Stopped (10/07/24 1203)   norepinephrine  (LEVOPHED ) Adult infusion Stopped (10/07/24  1654)   vancomycin  Stopped (10/07/24 1309)   PRN Meds:.HYDROmorphone  (DILAUDID ) injection, ondansetron  **OR** ondansetron  (ZOFRAN ) IV, mouth rinse, oxyCODONE , sodium chloride  flush   Anti-infectives (From admission, onward)    Start     Dose/Rate Route Frequency Ordered Stop   10/06/24 1200  metroNIDAZOLE  (FLAGYL ) IVPB 500 mg        500 mg 100 mL/hr over 60 Minutes Intravenous Every 12 hours 10/06/24 0933 10/13/24 1159   10/06/24 1200  vancomycin  (VANCOCIN ) IVPB 1000 mg/200 mL premix        1,000 mg 200 mL/hr over 60 Minutes Intravenous Every 24 hours 10/06/24 1039     10/06/24 1130  ceFEPIme (MAXIPIME) 2 g in sodium chloride  0.9 % 100 mL IVPB        2 g 200 mL/hr over 30 Minutes Intravenous Every 12 hours 10/06/24 1039 10/13/24 1129   10/06/24 0145  aztreonam (AZACTAM) 2 g in sodium chloride  0.9 % 100 mL IVPB        2 g 200 mL/hr over 30 Minutes Intravenous  Once 10/06/24 0139 10/06/24 0215   10/06/24 0145  metroNIDAZOLE  (FLAGYL ) IVPB 500 mg        500 mg 100 mL/hr over 60 Minutes Intravenous  Once 10/06/24 0139 10/06/24 0252   10/06/24 0145  vancomycin  (VANCOCIN ) IVPB 1000 mg/200 mL premix  1,000 mg 200 mL/hr over 60 Minutes Intravenous  Once 10/06/24 0139 10/06/24 0251         Subjective: Donny Corsi today has no fevers, no emesis,  No chest pain,    - Cough, dyspnea and hypoxia persist - Denies abdominal pain or chest pains   Objective: Vitals:   10/07/24 1700 10/07/24 1715 10/07/24 1956 10/07/24 2009  BP: (!) 153/72 114/65 (!) 97/55   Pulse: 93 90 70   Resp: 20 (!) 22 15   Temp:    97.7 F (36.5 C)  TempSrc:    Oral  SpO2: 98% 100% 100%   Weight:      Height:        Intake/Output Summary (Last 24 hours) at 10/07/2024 2022 Last data filed at 10/07/2024 1657 Gross per 24 hour  Intake 1428.07 ml  Output 150 ml  Net 1278.07 ml   Filed Weights   10/06/24 0002 10/07/24 0600  Weight: 108 kg 103.1 kg    Physical Exam  Gen:- Awake Alert, no  conversational dyspnea HEENT:- [place].AT, No sclera icterus Neck-Supple Neck,No JVD,.  Lungs-diminished on the left, no wheezing  CV- S1, S2 normal, regular  Abd-  +ve B.Sounds, Abd Soft, No tenderness,    Extremity/Skin:- No  edema, pedal pulses present  Psych-affect is appropriate, oriented x3 Neuro-generalized weakness, no new focal deficits, no tremors  Data Reviewed: I have personally reviewed following labs and imaging studies  CBC: Recent Labs  Lab 10/05/24 2353 10/06/24 0447 10/07/24 0500  WBC 20.6* 24.2* 25.7*  NEUTROABS 19.0*  --   --   HGB 10.3* 9.4* 8.3*  HCT 34.0* 30.7* 26.8*  MCV 89.9 89.8 90.8  PLT 468* 445* 413*   Basic Metabolic Panel: Recent Labs  Lab 10/05/24 2353 10/06/24 0447 10/07/24 0500  NA 130* 130* 129*  K 3.8 3.4* 4.0  CL 89* 90* 93*  CO2 32 28 29  GLUCOSE 106* 117* 138*  BUN 20 22 23   CREATININE 1.01* 1.21* 1.08*  CALCIUM  10.2 9.7 9.1   GFR: Estimated Creatinine Clearance: 57.7 mL/min (A) (by C-G formula based on SCr of 1.08 mg/dL (H)). Liver Function Tests: Recent Labs  Lab 10/05/24 2353 10/07/24 0500  AST 30 17  ALT 7 <5  ALKPHOS 96 86  BILITOT 0.3 0.3  PROT 7.1 5.7*  ALBUMIN  2.5* 2.1*   Cardiac Enzymes: No results for input(s): CKTOTAL, CKMB, CKMBINDEX, TROPONINI in the last 168 hours. BNP (last 3 results) No results for input(s): PROBNP in the last 8760 hours. HbA1C: No results for input(s): HGBA1C in the last 72 hours. Sepsis Labs: @LABRCNTIP (procalcitonin:4,lacticidven:4) ) Recent Results (from the past 240 hours)  Blood Culture (routine x 2)     Status: None (Preliminary result)   Collection Time: 10/05/24 11:53 PM   Specimen: BLOOD  Result Value Ref Range Status   Specimen Description BLOOD BLOOD RIGHT FOREARM  Final   Special Requests   Final    BOTTLES DRAWN AEROBIC AND ANAEROBIC Blood Culture adequate volume   Culture  Setup Time   Final    AEROBIC BOTTLE ONLY GRAM POSITIVE RODS Gram Stain Report  Called to,Read Back By and Verified With: JAYSON AHLE RN [phone] K FORSYTH   Culture   Final    NO GROWTH 1 DAY Performed at [facility], [address] [place], [place] [address]    Report Status PENDING  Incomplete  Blood Culture (routine x 2)     Status: None (Preliminary result)   Collection  Time: 10/06/24 12:38 AM   Specimen: BLOOD  Result Value Ref Range Status   Specimen Description BLOOD LEFT ANTECUBITAL  Final   Special Requests   Final    BOTTLES DRAWN AEROBIC AND ANAEROBIC Blood Culture results may not be optimal due to an inadequate volume of blood received in culture bottles   Culture   Final    NO GROWTH 1 DAY Performed at [facility], [address], [place], [place] [address]    Report Status PENDING  Incomplete  MRSA Next Gen by PCR, Nasal     Status: None   Collection Time: 10/06/24  4:22 AM   Specimen: Nasal Mucosa; Nasal Swab  Result Value Ref Range Status   MRSA by PCR Next Gen NOT DETECTED NOT DETECTED Final    Comment: (NOTE) The GeneXpert MRSA Assay (FDA approved for NASAL specimens only), is one component of a comprehensive MRSA colonization surveillance program. It is not intended to diagnose MRSA infection nor to guide or monitor treatment for MRSA infections. Test performance is not FDA approved in patients less than 64 years old. Performed at [facility], [address], [place], [place] [address]   Respiratory (~20 pathogens) panel by PCR     Status: None   Collection Time: 10/06/24 10:56 AM   Specimen: Nasopharyngeal Swab; Respiratory  Result Value Ref Range Status   Adenovirus NOT DETECTED NOT DETECTED Final   Coronavirus 229E NOT DETECTED NOT DETECTED Final    Comment: (NOTE) The Coronavirus on the Respiratory Panel, DOES NOT test for the novel  Coronavirus (2019 nCoV)    Coronavirus HKU1 NOT DETECTED NOT DETECTED Final   Coronavirus NL63 NOT DETECTED NOT DETECTED Final   Coronavirus OC43 NOT DETECTED NOT DETECTED Final   Metapneumovirus  NOT DETECTED NOT DETECTED Final   Rhinovirus / Enterovirus NOT DETECTED NOT DETECTED Final   Influenza A NOT DETECTED NOT DETECTED Final   Influenza B NOT DETECTED NOT DETECTED Final   Parainfluenza Virus 1 NOT DETECTED NOT DETECTED Final   Parainfluenza Virus 2 NOT DETECTED NOT DETECTED Final   Parainfluenza Virus 3 NOT DETECTED NOT DETECTED Final   Parainfluenza Virus 4 NOT DETECTED NOT DETECTED Final   Respiratory Syncytial Virus NOT DETECTED NOT DETECTED Final   Bordetella pertussis NOT DETECTED NOT DETECTED Final   Bordetella Parapertussis NOT DETECTED NOT DETECTED Final   Chlamydophila pneumoniae NOT DETECTED NOT DETECTED Final   Mycoplasma pneumoniae NOT DETECTED NOT DETECTED Final    Comment: Performed at [facility], [address], [place], [place] [address]  Gram stain     Status: None   Collection Time: 10/07/24 12:45 PM   Specimen: Pleura  Result Value Ref Range Status   Specimen Description PLEURAL  Final   Special Requests PLEURAL  Final   Gram Stain   Final    NO ORGANISMS SEEN WBC PRESENT, PREDOMINANTLY PMN Performed at [facility], [address] [place], [place] [address]    Report Status 10/07/2024 FINAL  Final      Radiology Studies: DG Chest Port 1 View Result Date: 10/07/2024 EXAM: 1 VIEW(S) XRAY OF THE CHEST 10/07/2024 01:16:00 PM COMPARISON: Yesterday's study. CLINICAL HISTORY: [id number] Status post thoracentesis [id number]. S/p thoracentesis. FINDINGS: LUNGS AND PLEURA: Large left pleural effusion is noted which is slightly decreased compared to prior exam. No pneumothorax. HEART AND MEDIASTINUM: No acute abnormality of the cardiac and mediastinal silhouettes. BONES AND SOFT TISSUES: No acute osseous abnormality. IMPRESSION: 1. Large left pleural effusion, slightly  decreased from prior. 2. No pneumothorax. Electronically signed by: Lynwood Seip MD 10/07/2024 01:35 PM EDT RP Workstation: [id number]   US  THORACENTESIS ASP PLEURAL SPACE W/IMG GUIDE Result  Date: 10/07/2024 INDICATION: 68 year old female. Extensive medical history. Found to have a left-sided pleural effusion. Request is for therapeutic and diagnostic thoracentesis EXAM: ULTRASOUND GUIDED THERAPEUTIC AND DIAGNOSTIC LEFT-SIDED THORACENTESIS MEDICATIONS: Lidocaine  1% 10 mL COMPLICATIONS: None immediate. PROCEDURE: An ultrasound guided thoracentesis was thoroughly discussed with the patient and questions answered. The benefits, risks, alternatives and complications were also discussed. The patient understands and wishes to proceed with the procedure. Written consent was obtained. Ultrasound was performed to localize and mark an adequate pocket of fluid in the left chest. The area was then prepped and draped in the normal sterile fashion. 1% Lidocaine  was used for local anesthesia. Under ultrasound guidance a 6 Fr Safe-T-Centesis catheter was introduced. Thoracentesis was performed. The catheter was removed and a dressing applied. FINDINGS: Found to have a loculated pleural effusion. A total of approximately 200 mL of amber colored fluid was removed. Samples were sent to the laboratory as requested by the clinical team. IMPRESSION: Successful ultrasound guided therapeutic and diagnostic right-sided thoracentesis yielding 200 mL of amber colored pleural fluid. Read by: Delon Beagle, NP Electronically Signed   By: Wilkie Lent M.D.   On: 10/07/2024 13:21   CT ABDOMEN PELVIS WO CONTRAST Result Date: 10/06/2024 EXAM: CT ABDOMEN AND PELVIS WITHOUT CONTRAST 10/06/2024 10:52:34 PM TECHNIQUE: CT of the abdomen and pelvis was performed without the administration of intravenous contrast. Multiplanar reformatted images are provided for review. Automated exposure control, iterative reconstruction, and/or weight-based adjustment of the mA/kV was utilized to reduce the radiation dose to as low as reasonably achievable. COMPARISON: CTA abdomen and pelvis 07/01/2024, CTA abdomen and pelvis 06/29/2024.  CLINICAL HISTORY: Sepsis. AMS, SESPSIS. FINDINGS: LOWER CHEST: A large left pleural effusion is again noted, collapsing the lingular base and left lower lobe. A moderate right pleural effusion partially collapses the right lower lobe. Underlying pneumonia would be difficult to exclude. The mid to anterior right lung base is clear. Mild cardiomegaly. The lower mitral ring is heavily calcified. There is no substantial pericardial effusion. LIVER: There is slight nodularity of the left lobe of the liver concerning for early cirrhosis. The liver is mildly steatotic measuring 20 cm in length. No mass is seen without contrast. GALLBLADDER AND BILE DUCTS: The gallbladder is absent. No biliary ductal dilatation. SPLEEN: No focal abnormality of the unenhanced spleen. PANCREAS: No focal abnormality of the unenhanced pancreas. ADRENAL GLANDS: There is no adrenal mass. KIDNEYS, URETERS AND BLADDER: No contour deforming mass of the unenhanced kidneys. Previously there was an irregular 4 cm cystic abnormality of the lower lateral right kidney, which is not seen today, and may have had an infectious etiology. Without contrast, I could not confirm full resolution of the abnormality. There is an 8 mm nonobstructive calculus in a calyceal infundibulum in the mid pole left kidney, smaller stones posterior to it measuring 4 mm and 2 mm. No further nephrolithiasis is seen. No hydronephrosis or ureteral stone. No perinephric or periureteral stranding. There are multiple tiny layering stones along the right posterior bladder wall. The bladder thickness is normal. Unsure if the stones were present previously because there was contrast in the bladder on both prior studies and bladder decompression due to catheterization. GI AND BOWEL: The stomach is contracted, unchanged. There previously was a drain in between the stomach and liver, which has been removed. There is a  small stable duodenal diverticulum. There are embolization coils in the  medial proximal paraduodenal area, probably due to GDA embolization seen previously. The small bowel is normal caliber. 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 A rather large ventral hernia is chronically seen with herniation of mesentery, small and large bowel with wide rectus diastasis. The appendix has been removed. No bowel obstruction or inflammation is suspected. PERITONEUM AND RETROPERITONEUM: Trace ascites in the posterior pelvis similar to prior study. There is no free air, free hemorrhage, or abscess. VASCULATURE: Aorta is normal in caliber. There is moderate to heavy aortoiliac calcification without aneurysm. Pelvic phleboliths. LYMPH NODES: No adenopathy is seen. REPRODUCTIVE ORGANS: No acute abnormality. The uterus is surgically absent. No adnexal mass. BONES AND SOFT TISSUES: Sacral decubitus ulcer is similar to prior exams. There is osteopenia, levoscoliosis, and advanced degenerative change of the lumbar spine. Multilevel lumbar acquired spinal stenosis. No acute osseous abnormality. No other  focal soft tissue abnormality. IMPRESSION: 1. Large left pleural effusion collapsing the lingular base and left lower lobe, and moderate right pleural effusion partially collapsing the right lower lobe. Underlying pneumonia would be difficult to exclude. 2. Slight nodularity of the left hepatic lobe concerning for early cirrhosis; mild hepatic steatosis. No hepatic mass identified on this noncontrast exam. 3. Nonobstructive left renal calculi measuring up to 8 mm; multiple small layering calculi along the right posterior bladder wall. No hydronephrosis or ureteral stone. 4. Advanced degenerative change of the lumbar spine with multilevel acquired spinal stenosis. SABRA 5. Prior finding of a 4 cm complex cystic abnormality of the lower lateral right kidney is not  seen today, but full resolution is not confirmed without contrast. 6. 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 No bowel obstruction or inflammatory process identified. 7. Large ventral hernia containing mesentery and bowel with wide rectus diastasis. 8. Minimal pelvic ascites. Electronically signed by: Francis Quam MD 10/06/2024 11:30 PM EDT RP Workstation: [id number]   CT Head Wo Contrast Result Date: 10/06/2024 EXAM: CT HEAD WITHOUT CONTRAST 10/06/2024 10:52:34 PM TECHNIQUE: CT of the head was performed without the administration of intravenous contrast. Automated exposure control, iterative reconstruction, and/or weight based adjustment of the mA/kV was utilized to reduce the radiation dose to as low as reasonably achievable. COMPARISON: None available. CLINICAL HISTORY: Mental status change, unknown cause. AMS, SESPSIS. FINDINGS: BRAIN AND VENTRICLES: There is mild cerebral atrophy, small vessel disease, and atrophic ventriculomegaly. There are multiple scattered subcentimeter parafalcine lipomas along the frontal falx. Cerebellum and brainstem are unremarkable. No cortical-based acute infarct or hemorrhage. No extra-axial collection. No mass effect or midline shift. Basal cisterns are clear. There are patchy calcifications of the carotid siphons. No hyperdense central vessels. ORBITS: The orbits are unremarkable. SINUSES: The sinuses are clear. SOFT TISSUES AND SKULL: No acute soft tissue abnormality. No skull fractures or focal skull lesions noted. Diffuse bilateral mastoid effusions. Both middle ear cavities remain clear. IMPRESSION: 1. No acute intracranial CT findings. Mild cerebral atrophy and small vessel changes. 2. Multiple subcentimeter parafalcine lipomas. 3. Diffuse bilateral mastoid effusions. Clear sinuses and middle ear cavities. Electronically signed by: Francis Quam MD 10/06/2024 11:02 PM EDT RP Workstation: [id number]    DG CHEST PORT 1 VIEW Result Date: 10/06/2024 CLINICAL DATA:  PICC placement EXAM: PORTABLE CHEST 1 VIEW COMPARISON:  10/06/2024 FINDINGS: Right  PICC line placement with the tip in the SVC. New complete opacification of the left hemithorax. Interstitial prominence throughout the right lung. This may reflect interstitial edema. No acute bony abnormality. IMPRESSION: Right PICC line tip in the SVC. New complete opacification of the left hemithorax. Interstitial prominence in the right lung may reflect interstitial edema. Electronically Signed   By: Franky Crease M.D.   On: 10/06/2024 19:08   US  EKG SITE RITE Result Date: 10/06/2024 If Site Rite image not attached, placement could not be confirmed due to current cardiac rhythm.  DG Chest Port 1 View Result Date: 10/06/2024 CLINICAL DATA:  Questionable sepsis - evaluate for abnormality EXAM: PORTABLE CHEST 1 VIEW COMPARISON:  07/08/2024 FINDINGS: Moderate left pleural effusion, slightly decreased since prior study. Left lower lobe opacity, likely atelectasis. Right lung clear. Heart normal size. No acute bony abnormality. IMPRESSION: Moderate left pleural effusion with left lower lobe atelectasis. Electronically Signed   By: Franky Crease M.D.   On: 10/06/2024 00:25   Scheduled Meds:  Chlorhexidine  Gluconate Cloth  6 each Topical Daily   pantoprazole  (PROTONIX ) IV  40 mg Intravenous Q24H   sodium chloride  flush  10-40 mL Intracatheter Q12H   Continuous Infusions:  ceFEPime (MAXIPIME) IV Stopped (10/07/24 1051)   metronidazole  Stopped (10/07/24 1203)   norepinephrine  (LEVOPHED ) Adult infusion Stopped (10/07/24 1654)   vancomycin  Stopped (10/07/24 1309)     LOS: 1 day    Rendall Carwin M.D on 10/07/2024 at 8:22 PM  Go to [email or web address] - for contact info  [facility] - Office  [phone]  If 7PM-7AM, please contact night-coverage [email or web address] 10/07/2024, 8:22 PM

## 2024-10-07 NOTE — Progress Notes (Signed)
 Patient tolerated left thoracentesis procedure well today and 200 mL of serosanguinous fluid removed and sent to lab for processing. Patient verbalized understanding of post procedure instructions and had no acute distress noted at completion of procedure. RN assigned to patient given report about thoracentesis results. Portable chest xray to be done in the inpatient room.

## 2024-10-07 NOTE — Progress Notes (Signed)
   10/07/24 1956  BiPAP/CPAP/SIPAP  $ Face Mask Small Yes  BiPAP/CPAP/SIPAP Pt Type Adult  BiPAP/CPAP/SIPAP DREAMSTATIOND  Mask Type Nasal mask  Dentures removed? Not applicable  Mask Size Small  Flow Rate 4 lpm  Patient Home Machine No  Patient Home Mask No  Patient Home Tubing No  Auto Titrate Yes  Minimum cmH2O 4 cmH2O  Maximum cmH2O 8 cmH2O  Device Plugged into RED Power Outlet Yes  BiPAP/CPAP /SiPAP Vitals  Pulse Rate 70  Resp 15  BP (!) 97/55  SpO2 100 %  MEWS Score/Color  MEWS Score 1  MEWS Score Color Green   Patient wanted a nasal mask instead of a full face. This RT switched patient to nasal mask and she was ready to wear for the night.

## 2024-10-07 NOTE — Progress Notes (Signed)
 PHARMACIST - PHYSICIAN COMMUNICATION   CONCERNING: IV hydrocortisone   Current order: IV hydrocortisone 50mg  IV every 6 hours     DESCRIPTION: Per [place] Protocol:   IV hydrocortisone will be converted to either a q8h or q12h frequency with the same total daily dose (TDD).   Ordered Dose: 1 to 200 mg TDD; convert to: TDD div q12h.  Ordered Dose: 201 to 300 mg TDD; convert to: TDD div q8h.  Ordered Dose: >300 mg TDD; DAW.    Order has been adjusted to: IV hydrocortisone 100mg  every 12 hours   Estill Christina Castaneda , PharmD, BCPS Clinical Pharmacist  05/28/2024 3:23 PM

## 2024-10-07 NOTE — Consult Note (Signed)
 WOC Nurse ostomy consult note  WOC consult performed remotely utilizing imaging and chart review- request for supply lawson numbers for an established ostomy Stoma type/location: RLQ colostomy from 2017 Treatment options for stomal/peristomal skin: 2 inch barrier ring Output: stool Ostomy pouching: 1pc. Convex [facility] # [id number]) and barrier ring (LAWSON # [phone])  Education provided: NA Enrolled patient in [facility] Secure Start DC program: NA   WOC team will not follow at this time, please re consult if new needs arise.  Thank you,  Doyal Polite, MSN, RN, [facility] WOC Team [phone] (Available Mon-Fri 0700-1500)

## 2024-10-07 NOTE — Plan of Care (Signed)
  Problem: Education: Goal: Knowledge of General Education information will improve Description: Including pain rating scale, medication(s)/side effects and non-pharmacologic comfort measures Outcome: Progressing   Problem: Pain Managment: Goal: General experience of comfort will improve and/or be controlled Outcome: Progressing

## 2024-10-07 NOTE — Plan of Care (Signed)
  Problem: Clinical Measurements: Goal: Cardiovascular complication will be avoided Outcome: Progressing   Problem: Elimination: Goal: Will not experience complications related to bowel motility Outcome: Progressing Goal: Will not experience complications related to urinary retention Outcome: Progressing   Problem: Respiratory: Goal: Ability to maintain adequate ventilation will improve Outcome: Progressing   Problem: Health Behavior/Discharge Planning: Goal: Ability to manage health-related needs will improve Outcome: Not Progressing

## 2024-10-07 NOTE — Procedures (Signed)
 Ultrasound-guided diagnostic and therapeutic left sided thoracentesis performed yielding 200 milliliters of amber colored fluid. No immediate complications.   Diagnostic fluid was sent to the lab for further analysis. Follow-up chest x-ray pending. EBL is < 2 ml.

## 2024-10-07 NOTE — Progress Notes (Signed)
   10/07/24 2256  BiPAP/CPAP/SIPAP  BiPAP/CPAP/SIPAP Pt Type Adult  BiPAP/CPAP/SIPAP DREAMSTATIOND  Mask Type (S)  Full face mask  Dentures removed? Not applicable  Mask Size Medium  Respiratory Rate 19 breaths/min  Pressure Support  (3-6cmH2O automode)  Flow Rate 4 lpm  Patient Home Machine No  Patient Home Mask No  Patient Home Tubing No  Auto Titrate Yes  Minimum cmH2O 5 cmH2O  Maximum cmH2O 20 cmH2O  Device Plugged into RED Power Outlet Yes  BiPAP/CPAP /SiPAP Vitals  Pulse Rate 80  Resp 17  BP (!) 110/45  SpO2 95 %  MEWS Score/Color  MEWS Score 0  MEWS Score Color Green   Patient wanted mask switched back to full face. She continues to state that she isn't getting enough air. MD had placed order to BIPAP at bedtime so this RT changed her dreamstation to [facility] mode. Minimum EPAP 5, MAX 20, PS 3-6cmH2O. Patient tolerating at this time.

## 2024-10-07 NOTE — Progress Notes (Signed)
 Daily Progress Note   Patient Name: Christina Castaneda       Date: 10/07/2024 DOB: 25-Oct-1956  Age: 68 y.o. MRN#: [id number] Attending Physician: Pearlean Manus, MD Primary Care Physician: [name], DO Admit Date: 10/05/2024  Reason for Consultation/Follow-up: Establishing goals of care  Subjective:  68 y.o. female  with past medical history of COPD, asthma, atypical endometrial hyperplasia, goiter, gout, hypertension, hypothyroidism, and vitamin B12 deficiency admitted on 10/05/2024 with fever and confusion.    Workup revealed findings concerning for sepsis.  Broad-spectrum antibiotics and aggressive IV hydration initiated origin at present is unclear.  10/06/2024: initial completed. GOC/ACP discussed. Patient desired additional time to consider code status. Goals are to ultimately return home with husband. Followed up with husband per patient's request. He is in agreement with DNR/DNI as he does not want her suffering but wants her to make the decision. PMT to follow.   Today, Labs independently reviewed.  Patient has some progressive hyponatremia.  Would continue IV fluids and monitoring.  Mild hyperglycemia noted of questionable significance.  Renal function improving with creatinine 1.08 (1.21 yesterday).  CBC reviewed.  White blood cell count trending up from yesterday with reading of 25.7 today compared to 24.2 yesterday and 20.6 on admission.  Hemoglobin continues to trend down (today: 8.3 g/dL<<9.4<<10.3-on admission).  Urine reveals many bacteria, large leuks, and moderate blood.  Urine culture pending.  Vital signs reviewed. BP improved with medication support. O2 sats maintained with CPAP/supplemental O2. Otherwise, vital signs stable. Medication administration record reviewed. Required initiation of vasosuppressor support.  Continues on broad spectrum abx and IV fluids. She has had a total of 2 mg of IV Dilaudid  on 24 hr look back. Had 2 mg of IV morphine  on 24 hr look back (this was ineffective for pain per her reports). Had some hypotension after Dilaudid  1 mg given so updated regimen to Dilaudid  0.5 mg q 3 hrs PRN. Otherwise, no PRN symptom meds administered.   Patient continues to report some pain to her bottom.  Dilaudid  is effective but wears off quickly.  Pain is quite bothersome.  We discussed adjustments to pain regimen and patient is agreeable to adding oral opioid for longer acting pain relief.  Otherwise, are reasonably well-controlled.  Her most bothersome symptom at this time is pain.  Spoke with patient again today regarding ACP/GOC.  Engaged in a detailed conversation with the patient about the severity of her current illness and possibility for further acute physical decline and potential implications this may have on long-term functional status, prognosis, and outcomes. Patient's goal is to ultimately return home although she  recognizes that she is critically ill and this may be difficult.  She states that at this point having good quality of life is important for her.  Given goals of care, engaged in discussion of advance directives including the limitations and potential burdens of CPR and intubation, particularly in the context of advanced age and serious underlying health conditions. We discussed how the potential consequences of surviving CPR could significantly affect a person's quality of life--particularly if their definition of quality of life involves maintaining independence and functional ability. Following a goals-of-care conversation, the patient opted for DNR/DNI status and this has been documented in the medical record.  Patient wishes to continue to treat the treatable and hopes of improving.  However, she states that if she passes she wants to be be allowed to go peacefully. Reviewed the MOST  form in detail with the patient, including how its components reflect her care preferences. Documentation was finalized and scanned into the electronic medical record.  Chart review/care coordination:  Completed extensive chart review including EPIC notes, MAR, vital signs, and labs (independently reviewed). Coordinated care with attending physician, bedside nursing staff, and TOC.  Plan is for patient to go for thoracentesis today.  Attempted to update patient's husband via phone.  Unfortunately, no answer.  Left voicemail requesting return call.  Length of Stay: 1   Physical Exam Constitutional:      General: She is not in acute distress.    Appearance: She is ill-appearing. She is not toxic-appearing.  Pulmonary:     Effort: No respiratory distress.     Breath sounds: Rales present.  Musculoskeletal:     Right lower leg: Edema present.     Left lower leg: Edema present.  Skin:    General: Skin is warm and dry.  Neurological:     Mental Status: She is alert and oriented to person, place, and time.             Vital Signs: BP (!) 134/50   Pulse 98   Temp 99.2 F (37.3 C) (Axillary) Comment (Src): pt was on cpap, fixed pt's TV for her  Resp (!) 25   Ht 5' 5 (1.651 m)   Wt 103.1 kg   LMP 11/18/2012   SpO2 (!) 88%   BMI 37.82 kg/m  SpO2: SpO2: (!) 88 % O2 Device: O2 Device: CPAP O2 Flow Rate: O2 Flow Rate (L/min): 5 L/min      Palliative Assessment/Data: Currently: 20 to 30%   Palliative Care Assessment & Plan   Patient Profile/Assessment:  68 year old female admitted for sepsis secondary now felt to be secondary to sacral decubitus ulcer. She has had significant declines in her physical and functional health over the past few months (prolonged hospitalization, now bed bound, large wound, at SNF for rehab with little progress).  Initial goals of care/ACP completed on 10/14/125.  Plan was to continue full code to allow her time to consider and discussed with her  husband and treat the treatable. Husband in agreement with DNR/DNI if patient is agreeable. Pain regimen updated due to suboptimal pain control.  Patient seen again on 10/07/2024.  Symptoms and goals of care readdressed.  Patient continues to have suboptimally controlled pain with end of dose failure.  Will update symptom regimen.  After ACP discussion, patient elects to transition to DNR/DNI status.  Treat the treatable.  MOST and goldenrod forms completed.   Recommendations/Plan:  CODE STATUS updated to DNR/DNI Continue to treat the treatable Update symptom  regimen for better control of pain Palliative medicine team will continue to follow for ongoing goals of care discussion, symptom management, and coordination of care.   Symptom management:  Ordered oxycodone  5 mg every 4 hours as needed give at least 1 hour apart from IV opioids Continue hydromorphone  0.5 mg every 3 hours as needed Continue Zofran  4 mg every 6 hours as needed   Prognosis: Guarded, remains critically ill    Discharge Planning: To Be Determined    Detailed review of medical records (labs, imaging, vital signs), medically appropriate exam, discussed with treatment team, counseling and education to patient, family, & staff, documenting clinical information, medication management, coordination of care     Billing based on MDM: High  Problems Addressed: One acute or chronic illness or injury that poses a threat to life or bodily function  Amount and/or Complexity of Data: Category 3:Discussion of management or test interpretation with external physician/other qualified health care professional/appropriate source (not separately reported)  Risks: Parenteral controlled substances and Decision not to resuscitate or to de-escalate care because of poor prognosis         Laymon CHRISTELLA Pinal, NP  Palliative Medicine Team Team phone # [phone]  Thank you for allowing the Palliative Medicine Team to assist in  the care of this patient. Please utilize secure chat with additional questions, if there is no response within 30 minutes please call the above phone number.  Palliative Medicine Team providers are available by phone from 7am to 7pm daily and can be reached through the team cell phone.  Should this patient require assistance outside of these hours, please call the patient's attending physician.

## 2024-10-07 NOTE — TOC Initial Note (Signed)
 Transition of Care [facility]) - Initial/Assessment Note    Patient Details  Name: Christina Castaneda MRN: [id number] Date of Birth: Feb 13, 1956  Transition of Care [facility]) CM/SW Contact:    Lucie Lunger, LCSWA Phone Number: 10/07/2024, 10:15 AM  Clinical Narrative:                 Pt is high risk for readmission. CSW notes PT is recommending SNF for pt. CSW notes per chart review that pt arrived from [facility]. CSW spoke with pt at bedside who confirms she has been at [facility] for SNF and plans to return once medically stable. TOC to follow.   Expected Discharge Plan: Skilled Nursing Facility Barriers to Discharge: Continued Medical Work up   Patient Goals and CMS Choice Patient states their goals for this hospitalization and ongoing recovery are:: return to SNF CMS Medicare.gov Compare Post Acute Care list provided to:: Patient Choice offered to / list presented to : Patient      Expected Discharge Plan and Services In-house Referral: Clinical Social Work Discharge Planning Services: CM Consult Post Acute Care Choice: Skilled Nursing Facility                                        Prior Living Arrangements/Services   Lives with:: Facility Resident Patient language and need for interpreter reviewed:: Yes Do you feel safe going back to the place where you live?: Yes      Need for Family Participation in Patient Care: Yes (Comment) Care giver support system in place?: Yes (comment)   Criminal Activity/Legal Involvement Pertinent to Current Situation/Hospitalization: No - Comment as needed  Activities of Daily Living   ADL Screening (condition at time of admission) Independently performs ADLs?: No Does the patient have a NEW difficulty with bathing/dressing/toileting/self-feeding that is expected to last >3 days?: Yes (Initiates electronic notice to provider for possible OT consult) Does the patient have a NEW difficulty with getting in/out of bed, walking, or climbing  stairs that is expected to last >3 days?: Yes (Initiates electronic notice to provider for possible PT consult) Does the patient have a NEW difficulty with communication that is expected to last >3 days?: No Is the patient deaf or have difficulty hearing?: No Does the patient have difficulty seeing, even when wearing glasses/contacts?: No Does the patient have difficulty concentrating, remembering, or making decisions?: No  Permission Sought/Granted                  Emotional Assessment Appearance:: Appears stated age Attitude/Demeanor/Rapport: Engaged Affect (typically observed): Accepting Orientation: : Oriented to Self, Oriented to Place, Oriented to  Time, Oriented to Situation Alcohol / Substance Use: Not Applicable Psych Involvement: No (comment)  Admission diagnosis:  Hyponatremia [E87.1] Thrombocytosis [D75.839] Pleural effusion on left [J90] Normochromic normocytic anemia [D64.9] Elevated random blood glucose level [R73.9] Sepsis (HCC) [A41.9] Sepsis due to undetermined organism (HCC) [A41.9] Pressure injury of sacral region, stage 3 (HCC) [L89.153] Patient Active Problem List   Diagnosis Date Noted   Pressure injury of skin 10/06/2024   Encephalopathy 10/06/2024   Arthritis of both knees 09/02/2024   Colostomy present (HCC) 09/02/2024   H/O: hysterectomy 09/02/2024   Hardening of the aorta (main artery of the heart) 09/02/2024   Hyperparathyroidism 09/02/2024   Neuropathy 09/02/2024   Osteopenia 09/02/2024   Primary gout 09/02/2024   Pulmonary nodule 09/02/2024   Stage 3 chronic  kidney disease (HCC) 09/02/2024   Vitamin D  deficiency 09/02/2024   Vitamin B12 deficiency 09/02/2024   Systolic murmur 09/02/2024   Melena 07/01/2024   Intra-abdominal adhesions 06/16/2024   Hemorrhagic shock (HCC) 06/15/2024   Peptic ulcer perforation and hemorrhage (HCC) 06/15/2024   Leukocytosis 06/15/2024   Thrombocytopenia 06/15/2024   Duodenal ulcer 06/15/2024   Femur  fracture, left (HCC) 06/07/2024   AKI (acute kidney injury) 05/27/2024    Class: Acute   Hyperlipidemia 05/27/2024   Sepsis (HCC) 05/27/2024   Cigarette smoker 01/04/2023   Coronary artery disease 08/22/2022   Chronic venous insufficiency 02/21/2017   Lymphedema 02/21/2017   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   Adjustment disorder with mixed anxiety and depressed mood    Debility 05/02/2016   Edema    Generalized weakness    Chronic pain syndrome    Hypoalbuminemia due to protein-calorie malnutrition    OSA (obstructive sleep apnea)    Morbid obesity due to excess calories (HCC)    S/P colostomy (HCC)    Tracheostomy status (HCC)    Tracheostomy care (HCC)    COPD  GOLD 2/AB    Tobacco abuse    Essential hypertension    Bilateral knee pain    Dysphagia    ABLA (acute blood loss anemia)    Hyponatremia    Thyroid  activity decreased    Acute on chronic respiratory failure with hypoxia (HCC)    Pressure ulcer 04/01/2016   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 - [place], [place] - [address] [place] [address] Phone: [phone] Fax: [phone]     Social Drivers of Health (SDOH) Social History: SDOH Screenings   Food Insecurity: Patient Unable To Answer (10/06/2024)  Housing: Patient Unable To Answer (10/06/2024)  Transportation Needs: Patient Unable To Answer (10/06/2024)  Utilities: Patient Unable To Answer (10/06/2024)  Social Connections: Unknown (10/06/2024)  Tobacco Use: High Risk (10/06/2024)   SDOH Interventions:      Readmission Risk Interventions    10/07/2024   10:14 AM 05/27/2024   10:01 PM  Readmission Risk Prevention Plan  Post Dischage Appt  Complete  Medication Screening  Complete  Transportation Screening Complete Complete  HRI or Home Care Consult Complete   Social Work Consult for Recovery Care Planning/Counseling Complete   Palliative Care Screening Not Applicable   Medication Review Oceanographer) Complete

## 2024-10-08 ENCOUNTER — Inpatient Hospital Stay (HOSPITAL_COMMUNITY)

## 2024-10-08 DIAGNOSIS — J810 Acute pulmonary edema: Secondary | ICD-10-CM | POA: Diagnosis not present

## 2024-10-08 DIAGNOSIS — G9341 Metabolic encephalopathy: Secondary | ICD-10-CM | POA: Diagnosis not present

## 2024-10-08 DIAGNOSIS — J90 Pleural effusion, not elsewhere classified: Secondary | ICD-10-CM

## 2024-10-08 DIAGNOSIS — G4733 Obstructive sleep apnea (adult) (pediatric): Secondary | ICD-10-CM | POA: Diagnosis not present

## 2024-10-08 DIAGNOSIS — J9621 Acute and chronic respiratory failure with hypoxia: Secondary | ICD-10-CM | POA: Diagnosis not present

## 2024-10-08 DIAGNOSIS — N1831 Chronic kidney disease, stage 3a: Secondary | ICD-10-CM | POA: Diagnosis not present

## 2024-10-08 LAB — CBC
HCT: 26.9 % — ABNORMAL LOW (ref 36.0–46.0)
Hemoglobin: 8 g/dL — ABNORMAL LOW (ref 12.0–15.0)
MCH: 27.1 pg (ref 26.0–34.0)
MCHC: 29.7 g/dL — ABNORMAL LOW (ref 30.0–36.0)
MCV: 91.2 fL (ref 80.0–100.0)
Platelets: 288 K/uL (ref 150–400)
RBC: 2.95 MIL/uL — ABNORMAL LOW (ref 3.87–5.11)
RDW: 15.7 % — ABNORMAL HIGH (ref 11.5–15.5)
WBC: 13.6 K/uL — ABNORMAL HIGH (ref 4.0–10.5)
nRBC: 0 % (ref 0.0–0.2)

## 2024-10-08 LAB — BASIC METABOLIC PANEL WITH GFR
Anion gap: 6 (ref 5–15)
BUN: 19 mg/dL (ref 8–23)
CO2: 29 mmol/L (ref 22–32)
Calcium: 8.9 mg/dL (ref 8.9–10.3)
Chloride: 97 mmol/L — ABNORMAL LOW (ref 98–111)
Creatinine, Ser: 0.77 mg/dL (ref 0.44–1.00)
GFR, Estimated: >60 mL/min (ref >60)
Glucose, Bld: 88 mg/dL (ref 70–99)
Potassium: 3.5 mmol/L (ref 3.5–5.1)
Sodium: 131 mmol/L — ABNORMAL LOW (ref 135–145)

## 2024-10-08 MED ORDER — FUROSEMIDE 10 MG/ML IJ SOLN
20.0000 mg | Freq: Once | INTRAMUSCULAR | Status: AC
  Administered 2024-10-08: 20 mg via INTRAVENOUS
  Filled 2024-10-08: qty 2

## 2024-10-08 MED ORDER — IPRATROPIUM-ALBUTEROL 0.5-2.5 (3) MG/3ML IN SOLN
3.0000 mL | Freq: Three times a day (TID) | RESPIRATORY_TRACT | Status: DC
  Administered 2024-10-08 – 2024-10-09 (×6): 3 mL via RESPIRATORY_TRACT
  Filled 2024-10-08 (×8): qty 3

## 2024-10-08 MED ORDER — IPRATROPIUM-ALBUTEROL 0.5-2.5 (3) MG/3ML IN SOLN
3.0000 mL | RESPIRATORY_TRACT | Status: DC | PRN
  Administered 2024-10-08 – 2024-10-10 (×3): 3 mL via RESPIRATORY_TRACT
  Filled 2024-10-08 (×3): qty 3

## 2024-10-08 NOTE — Progress Notes (Addendum)
 PROGRESS NOTE   Christina Castaneda, is a 68 y.o. female, DOB - 21-Feb-1956, FMW:[id number]  Admit date - 10/05/2024   Admitting Physician Evalene GORMAN Sprinkles, MD  Outpatient Primary MD for the patient is [name], DO  LOS - 2  Chief Complaint  Patient presents with   Altered Mental Status       Brief Narrative:  68 y.o. female with medical history significant of perforated sigmoid colon status post hamartoma, essential hypertension, history of hemorrhagic shock secondary to perforated duodenal ulcer status post operative repair and embolization for recurrent bleeding July 2025, COPD, obesity, sleep apnea, stage II CKD admitted from SNF facility on 10/06/2024 with severe sepsis septic shock due to empyema with acute hypoxic respiratory failure- --patient declines transfer to [facility] for CT surgery consult for chest tube and possible intervention for empyema    -Assessment and Plan: 1)Severe Sepsis with septic shock due to presumed empyema/loculated pleural effusion-POA - Patient met sepsis criteria on admission - patient with loculated pleural effusion with concerns for possible empyema --Patient underwent diagnostic Thora on 10/07/2024 200 mL removed due to loculated fluid unable to remove large amount of blood fluid--fluid studies still pending -Pleural fluid LHD 1,047,  serum LDH 111--consistent with exudative pleural effusion by lights criteria -A pleural fluid LDH >1,000 IU/L is also a poor prognostic factor in parapneumonic effusions and empyema, often indicating the need for chest tube drainage ---patient declines transfer to [facility] for CT surgery consult for chest tube and possible intervention for empyema -Pleural fluid Gram stain and culture NGTD -Pleural fluid cell count over 10,000 (97% neutrophils) --Patient will need CT surgery consult -Cannot rule out underlying malignancy -Lactate WNL  - Continue IV vancomycin  and cefepime as well as Flagyl  AEROBIC BOTTLE ONLY  GRAM POSITIVE RODS Gram Stain --await final ID and sensitivity--may be contaminant -weaned off Levophed   --c/n  hydrocortisone per protocol 50 mg IV every 6 hours for the next 5 days to help accelerate shock reversal and increased vasopressor free days WBC 24.2 >> 25.7 >>13.6 ----- WBC may start trending back up again with steroids  2)Acute on chronic respiratory failure with hypoxia (HCC) Pleural Effusion  Noted recent admission July 2025 with respiratory failure requiring supplemental oxygen and partial lung collapse-was discharged on room air -Required nonrebreather bag transition to 10 L of oxygen -As of 10/09/2023 weaned down to 4 L of oxygen - Antibiotics as above #1  3) metabolic Encephalopathy--due to above Mild generalized lethargy and confusion on presentation Suspect likely multifactorial with contributions of toxic metabolic etiologies as well as concurrent sepsis Grossly nonfocal neuroexam Blood gas with mild metabolic alkalosis - As of 10/08/2024 mentation is back to baseline patient is alert and oriented x 3  Acute on chronic anemia--- hemoglobin is down to 8.0 from a baseline usually above 10 - Suspect some component of hemodilution from IV fluid - No bleeding concerns-monitor closely  Pressure injury of skin Noted stage 3-4 sacral ulcer on evaluation  Will consult wound care  Will also check soft tissue imaging to rule as potential source of sepsis  Follow closely   COPD  GOLD 2/AB Noted decompensated respiratory status in the setting of sepsis Concurrent pleural effusion as well as left lower lobe atelectasis on imaging today Does not appear to be an active COPD exacerbation at present -Solu-Cortef as above #1 As needed DuoNeb On antibiotic coverage  in the setting of sepsis and empyema concerns    Stage 3 chronic kidney disease (HCC)  Cr 1 today with GFR of 60  Monitor   Perforated bowel (HCC) Noted history of perforated duodenal ulcer during admission  05/2024-06/2024  S/p general  surgery on 06/15/2024 and GDA embolization  06/25/2024 No reports of active bleeding at present  Hgb stable at 10.3 (baseline)  Hold AC given history  IV PPI  Monitor  OSA (obstructive sleep apnea) CPAP prn and nightly  Dysphagia On dysphagia 3 diet from 06/2024 admission  Continue   Essential hypertension LLN BP  Hold BP regimen for now   Social/Ethics--palliative care consult appreciated - patient husband requested DNR/DNI status ----patient declines transfer to [facility] for CT surgery consult for chest tube and possible intervention for empyema - Patient considering transitioning to hospice with comfort care but she has not made up her mind yet - Chaplain attempted to visit with patient on 10/08/2024 but patient declined chaplain services   Status is: Inpatient   Disposition: The patient is from: SNF--[facility]              Anticipated d/c is to: SNF              Anticipated d/c date is: > 3 days              Patient currently is not medically stable to d/c. Barriers: Not Clinically Stable-   Code Status :  -  Code Status: Limited: Do not attempt resuscitation (DNR) -DNR-LIMITED -Do Not Intubate/DNI    Family Communication:    Discussed with Husband  DVT Prophylaxis  :   - SCDs      Lab Results  Component Value Date   PLT 288 10/08/2024    Inpatient Medications  Scheduled Meds:  Chlorhexidine  Gluconate Cloth  6 each Topical Daily   hydrocortisone sod succinate (SOLU-CORTEF) inj  100 mg Intravenous Q12H   ipratropium-albuterol   3 mL Nebulization TID   pantoprazole  (PROTONIX ) IV  40 mg Intravenous Q24H   sodium chloride  flush  10-40 mL Intracatheter Q12H   Continuous Infusions:  ceFEPime (MAXIPIME) IV Stopped (10/08/24 1007)   metronidazole  Stopped (10/08/24 1524)   norepinephrine  (LEVOPHED ) Adult infusion Stopped (10/07/24 1654)   vancomycin  Stopped (10/08/24 1237)   PRN Meds:.HYDROmorphone  (DILAUDID ) injection,  ipratropium-albuterol , ondansetron  **OR** ondansetron  (ZOFRAN ) IV, mouth rinse, mouth rinse, oxyCODONE , sodium chloride  flush   Anti-infectives (From admission, onward)    Start     Dose/Rate Route Frequency Ordered Stop   10/06/24 1200  metroNIDAZOLE  (FLAGYL ) IVPB 500 mg        500 mg 100 mL/hr over 60 Minutes Intravenous Every 12 hours 10/06/24 0933 10/13/24 1159   10/06/24 1200  vancomycin  (VANCOCIN ) IVPB 1000 mg/200 mL premix        1,000 mg 200 mL/hr over 60 Minutes Intravenous Every 24 hours 10/06/24 1039     10/06/24 1130  ceFEPIme (MAXIPIME) 2 g in sodium chloride  0.9 % 100 mL IVPB        2 g 200 mL/hr over 30 Minutes Intravenous Every 12 hours 10/06/24 1039 10/13/24 1129   10/06/24 0145  aztreonam (AZACTAM) 2 g in sodium chloride  0.9 % 100 mL IVPB        2 g 200 mL/hr over 30 Minutes Intravenous  Once 10/06/24 0139 10/06/24 0215   10/06/24 0145  metroNIDAZOLE  (FLAGYL ) IVPB 500 mg        500 mg 100 mL/hr over 60 Minutes Intravenous  Once 10/06/24 0139 10/06/24 0252   10/06/24 0145  vancomycin  (VANCOCIN ) IVPB 1000 mg/200 mL  premix        1,000 mg 200 mL/hr over 60 Minutes Intravenous  Once 10/06/24 0139 10/06/24 0251      Subjective: Christina Castaneda today has no fevers, no emesis,  No chest pain,    - Complains of buttock pain,  -- Patient had increased cough and dyspnea overnight--repeat chest x-ray overnight noted - Unable to wean off oxygen ----patient declines transfer to [facility] for CT surgery consult for chest tube and possible intervention for empyema   Objective: Vitals:   10/08/24 1400 10/08/24 1500 10/08/24 1600 10/08/24 1618  BP: (!) 129/51 (!) 121/50 (!) 126/53   Pulse: 72 70 76   Resp: 14 14 15    Temp:    97.7 F (36.5 C)  TempSrc:    Oral  SpO2: 97% 97% 96%   Weight:      Height:        Intake/Output Summary (Last 24 hours) at 10/08/2024 1753 Last data filed at 10/08/2024 1726 Gross per 24 hour  Intake 1710.41 ml  Output 2050 ml  Net  -339.59 ml   Filed Weights   10/06/24 0002 10/07/24 0600  Weight: 108 kg 103.1 kg    Physical Exam  Gen:- Awake Alert, no conversational dyspnea HEENT:- [place].AT, No sclera icterus Nose- [place] 4L/min Neck-Supple Neck,No JVD,.  Lungs-diminished on the left, no wheezing  CV- S1, S2 normal, regular  Abd-  +ve B.Sounds, Abd Soft, No tenderness,    Extremity/Skin:- No  edema, pedal pulses present , buttock decubitus Psych-affect is appropriate, oriented x3 Neuro-generalized weakness, no new focal deficits, no tremors  Data Reviewed: I have personally reviewed following labs and imaging studies  CBC: Recent Labs  Lab 10/05/24 2353 10/06/24 0447 10/07/24 0500 10/08/24 0443  WBC 20.6* 24.2* 25.7* 13.6*  NEUTROABS 19.0*  --   --   --   HGB 10.3* 9.4* 8.3* 8.0*  HCT 34.0* 30.7* 26.8* 26.9*  MCV 89.9 89.8 90.8 91.2  PLT 468* 445* 413* 288   Basic Metabolic Panel: Recent Labs  Lab 10/05/24 2353 10/06/24 0447 10/07/24 0500 10/08/24 0443  NA 130* 130* 129* 131*  K 3.8 3.4* 4.0 3.5  CL 89* 90* 93* 97*  CO2 32 28 29 29   GLUCOSE 106* 117* 138* 88  BUN 20 22 23 19   CREATININE 1.01* 1.21* 1.08* 0.77  CALCIUM  10.2 9.7 9.1 8.9   GFR: Estimated Creatinine Clearance: 77.9 mL/min (by C-G formula based on SCr of 0.77 mg/dL). Liver Function Tests: Recent Labs  Lab 10/05/24 2353 10/07/24 0500  AST 30 17  ALT 7 <5  ALKPHOS 96 86  BILITOT 0.3 0.3  PROT 7.1 5.7*  ALBUMIN  2.5* 2.1*   Recent Results (from the past 240 hours)  Blood Culture (routine x 2)     Status: None (Preliminary result)   Collection Time: 10/05/24 11:53 PM   Specimen: BLOOD  Result Value Ref Range Status   Specimen Description BLOOD BLOOD RIGHT FOREARM  Final   Special Requests   Final    BOTTLES DRAWN AEROBIC AND ANAEROBIC Blood Culture adequate volume   Culture  Setup Time   Final    AEROBIC BOTTLE ONLY GRAM POSITIVE RODS Gram Stain Report Called to,Read Back By and Verified WithBETHA JAYSON AHLE RN [phone]  FORSYTH   Culture   Final    NO GROWTH 2 DAYS Performed at [facility], [address], [place], [place] [address]    Report Status PENDING  Incomplete  Blood Culture (routine  x 2)     Status: None (Preliminary result)   Collection Time: 10/06/24 12:38 AM   Specimen: BLOOD  Result Value Ref Range Status   Specimen Description BLOOD LEFT ANTECUBITAL  Final   Special Requests   Final    BOTTLES DRAWN AEROBIC AND ANAEROBIC Blood Culture results may not be optimal due to an inadequate volume of blood received in culture bottles   Culture   Final    NO GROWTH 2 DAYS Performed at [facility], [address], [place], [place] [address]    Report Status PENDING  Incomplete  MRSA Next Gen by PCR, Nasal     Status: None   Collection Time: 10/06/24  4:22 AM   Specimen: Nasal Mucosa; Nasal Swab  Result Value Ref Range Status   MRSA by PCR Next Gen NOT DETECTED NOT DETECTED Final    Comment: (NOTE) The GeneXpert MRSA Assay (FDA approved for NASAL specimens only), is one component of a comprehensive MRSA colonization surveillance program. It is not intended to diagnose MRSA infection nor to guide or monitor treatment for MRSA infections. Test performance is not FDA approved in patients less than 20 years old. Performed at [facility], [address], [place], [place] [address]   Respiratory (~20 pathogens) panel by PCR     Status: None   Collection Time: 10/06/24 10:56 AM   Specimen: Nasopharyngeal Swab; Respiratory  Result Value Ref Range Status   Adenovirus NOT DETECTED NOT DETECTED Final   Coronavirus 229E NOT DETECTED NOT DETECTED Final    Comment: (NOTE) The Coronavirus on the Respiratory Panel, DOES NOT test for the novel  Coronavirus (2019 nCoV)    Coronavirus HKU1 NOT DETECTED NOT DETECTED Final   Coronavirus NL63 NOT DETECTED NOT DETECTED Final   Coronavirus OC43 NOT DETECTED NOT DETECTED Final   Metapneumovirus NOT DETECTED NOT DETECTED Final   Rhinovirus / Enterovirus NOT  DETECTED NOT DETECTED Final   Influenza A NOT DETECTED NOT DETECTED Final   Influenza B NOT DETECTED NOT DETECTED Final   Parainfluenza Virus 1 NOT DETECTED NOT DETECTED Final   Parainfluenza Virus 2 NOT DETECTED NOT DETECTED Final   Parainfluenza Virus 3 NOT DETECTED NOT DETECTED Final   Parainfluenza Virus 4 NOT DETECTED NOT DETECTED Final   Respiratory Syncytial Virus NOT DETECTED NOT DETECTED Final   Bordetella pertussis NOT DETECTED NOT DETECTED Final   Bordetella Parapertussis NOT DETECTED NOT DETECTED Final   Chlamydophila pneumoniae NOT DETECTED NOT DETECTED Final   Mycoplasma pneumoniae NOT DETECTED NOT DETECTED Final    Comment: Performed at [facility], [address] [place], [place] [address]  Urine Culture     Status: Abnormal (Preliminary result)   Collection Time: 10/06/24 11:20 PM   Specimen: Urine, Random  Result Value Ref Range Status   Specimen Description   Final    URINE, RANDOM Performed at [facility], [address] [place], [place] [address]    Special Requests   Final    NONE Reflexed from [id number] Performed at [facility], [address] [place], [place] [address]    Culture 30,000 COLONIES/mL ENTEROCOCCUS FAECALIS (A)  Final   Report Status PENDING  Incomplete  Gram stain     Status: None   Collection Time: 10/07/24 12:45 PM   Specimen: Pleura  Result Value Ref Range Status   Specimen Description PLEURAL  Final   Special Requests PLEURAL  Final   Gram Stain   Final    NO ORGANISMS  SEEN WBC PRESENT, PREDOMINANTLY PMN Performed at [facility], [address] [place], [place] [address]    Report Status 10/07/2024 FINAL  Final  Culture, body fluid w Gram Stain-bottle     Status: None (Preliminary result)   Collection Time: 10/07/24 12:45 PM   Specimen: Pleura  Result Value Ref Range Status   Specimen Description PLEURAL LEFT  Final   Special Requests   Final    BOTTLES DRAWN AEROBIC AND ANAEROBIC Blood Culture adequate volume   Culture    Final    NO GROWTH < 24 HOURS Performed at [facility], [address], [place], [place] [address]    Report Status PENDING  Incomplete      Radiology Studies: DG Chest 1 View Result Date: 10/08/2024 EXAM: 1 VIEW(S) XRAY OF THE CHEST 10/08/2024 04:39:00 AM COMPARISON: 10/07/2024 CLINICAL HISTORY: Shortness of breath FINDINGS: LINES, TUBES AND DEVICES: Stable right PICC line. LUNGS AND PLEURA: Complete opacification of left hemithorax, increased since prior exam. Interstitial opacities in right lung suggesting pulmonary edema. Trace right pleural effusion. No pneumothorax. HEART AND MEDIASTINUM: No acute abnormality of the cardiac and mediastinal silhouettes. BONES AND SOFT TISSUES: No acute osseous abnormality. IMPRESSION: 1. Complete opacification of left hemithorax, increased since prior exam. 2. Trace right pleural effusion. 3. Interstitial opacities in right lung suggesting pulmonary edema. Electronically signed by: Evalene Coho MD 10/08/2024 04:56 AM EDT RP Workstation: [id number]   DG Chest Port 1 View Result Date: 10/07/2024 EXAM: 1 VIEW(S) XRAY OF THE CHEST 10/07/2024 01:16:00 PM COMPARISON: Yesterday's study. CLINICAL HISTORY: [id number] Status post thoracentesis [id number]. S/p thoracentesis. FINDINGS: LUNGS AND PLEURA: Large left pleural effusion is noted which is slightly decreased compared to prior exam. No pneumothorax. HEART AND MEDIASTINUM: No acute abnormality of the cardiac and mediastinal silhouettes. BONES AND SOFT TISSUES: No acute osseous abnormality. IMPRESSION: 1. Large left pleural effusion, slightly decreased from prior. 2. No pneumothorax. Electronically signed by: Lynwood Seip MD 10/07/2024 01:35 PM EDT RP Workstation: [id number]   US  THORACENTESIS ASP PLEURAL SPACE W/IMG GUIDE Result Date: 10/07/2024 INDICATION: 68 year old female. Extensive medical history. Found to have a left-sided pleural effusion. Request is for therapeutic and diagnostic thoracentesis EXAM: ULTRASOUND  GUIDED THERAPEUTIC AND DIAGNOSTIC LEFT-SIDED THORACENTESIS MEDICATIONS: Lidocaine  1% 10 mL COMPLICATIONS: None immediate. PROCEDURE: An ultrasound guided thoracentesis was thoroughly discussed with the patient and questions answered. The benefits, risks, alternatives and complications were also discussed. The patient understands and wishes to proceed with the procedure. Written consent was obtained. Ultrasound was performed to localize and mark an adequate pocket of fluid in the left chest. The area was then prepped and draped in the normal sterile fashion. 1% Lidocaine  was used for local anesthesia. Under ultrasound guidance a 6 Fr Safe-T-Centesis catheter was introduced. Thoracentesis was performed. The catheter was removed and a dressing applied. FINDINGS: Found to have a loculated pleural effusion. A total of approximately 200 mL of amber colored fluid was removed. Samples were sent to the laboratory as requested by the clinical team. IMPRESSION: Successful ultrasound guided therapeutic and diagnostic right-sided thoracentesis yielding 200 mL of amber colored pleural fluid. Read by: Delon Beagle, NP Electronically Signed   By: Wilkie Lent M.D.   On: 10/07/2024 13:21   CT ABDOMEN PELVIS WO CONTRAST Result Date: 10/06/2024 EXAM: CT ABDOMEN AND PELVIS WITHOUT CONTRAST 10/06/2024 10:52:34 PM TECHNIQUE: CT of the abdomen and pelvis was performed without the administration of intravenous contrast. Multiplanar reformatted images are provided for review. Automated exposure control, iterative reconstruction, and/or weight-based adjustment  of the mA/kV was utilized to reduce the radiation dose to as low as reasonably achievable. COMPARISON: CTA abdomen and pelvis 07/01/2024, CTA abdomen and pelvis 06/29/2024. CLINICAL HISTORY: Sepsis. AMS, SESPSIS. FINDINGS: LOWER CHEST: A large left pleural effusion is again noted, collapsing the lingular base and left lower lobe. A moderate right pleural effusion  partially collapses the right lower lobe. Underlying pneumonia would be difficult to exclude. The mid to anterior right lung base is clear. Mild cardiomegaly. The lower mitral ring is heavily calcified. There is no substantial pericardial effusion. LIVER: There is slight nodularity of the left lobe of the liver concerning for early cirrhosis. The liver is mildly steatotic measuring 20 cm in length. No mass is seen without contrast. GALLBLADDER AND BILE DUCTS: The gallbladder is absent. No biliary ductal dilatation. SPLEEN: No focal abnormality of the unenhanced spleen. PANCREAS: No focal abnormality of the unenhanced pancreas. ADRENAL GLANDS: There is no adrenal mass. KIDNEYS, URETERS AND BLADDER: No contour deforming mass of the unenhanced kidneys. Previously there was an irregular 4 cm cystic abnormality of the lower lateral right kidney, which is not seen today, and may have had an infectious etiology. Without contrast, I could not confirm full resolution of the abnormality. There is an 8 mm nonobstructive calculus in a calyceal infundibulum in the mid pole left kidney, smaller stones posterior to it measuring 4 mm and 2 mm. No further nephrolithiasis is seen. No hydronephrosis or ureteral stone. No perinephric or periureteral stranding. There are multiple tiny layering stones along the right posterior bladder wall. The bladder thickness is normal. Unsure if the stones were present previously because there was contrast in the bladder on both prior studies and bladder decompression due to catheterization. GI AND BOWEL: The stomach is contracted, unchanged. There previously was a drain in between the stomach and liver, which has been removed. There is a small stable duodenal diverticulum. 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 The small bowel is normal caliber. 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 A rather large ventral hernia is chronically seen with herniation of mesentery, small and large bowel with wide rectus diastasis. The appendix has been removed. No bowel obstruction or inflammation is suspected. PERITONEUM AND RETROPERITONEUM: Trace ascites in the posterior pelvis similar to prior study. There is no free air, free hemorrhage, or abscess. VASCULATURE: Aorta is normal in caliber. There is moderate to heavy aortoiliac calcification without aneurysm. Pelvic phleboliths. LYMPH NODES: No adenopathy is seen. REPRODUCTIVE ORGANS: No acute abnormality. The uterus is surgically absent. No adnexal mass. BONES AND SOFT TISSUES: Sacral decubitus ulcer is similar to prior exams. There is osteopenia, levoscoliosis, and advanced degenerative change of the lumbar spine. Multilevel lumbar acquired spinal stenosis. No acute osseous abnormality. No other  focal soft tissue abnormality. IMPRESSION: 1. Large left pleural effusion collapsing the lingular base and left lower lobe, and moderate right pleural effusion partially collapsing the right lower lobe. Underlying pneumonia would be difficult to exclude. 2. Slight nodularity of the left hepatic lobe concerning for early cirrhosis; mild hepatic steatosis. No hepatic mass identified on this noncontrast exam. 3. Nonobstructive left renal calculi measuring up to 8 mm; multiple small layering calculi along the right posterior bladder wall. No hydronephrosis or ureteral stone. 4. Advanced degenerative change of the lumbar spine with  multilevel acquired spinal stenosis. SABRA 5. Prior finding of a 4 cm complex cystic abnormality of the lower lateral right kidney is not seen today, but full resolution is not confirmed without contrast. 6. 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 No bowel  obstruction or inflammatory process identified. 7. Large ventral hernia containing mesentery and bowel with wide rectus diastasis. 8. Minimal pelvic ascites. Electronically signed by: Francis Quam MD 10/06/2024 11:30 PM EDT RP Workstation: [id number]   CT Head Wo Contrast Result Date: 10/06/2024 EXAM: CT HEAD WITHOUT CONTRAST 10/06/2024 10:52:34 PM TECHNIQUE: CT of the head was performed without the administration of intravenous contrast. Automated exposure control, iterative reconstruction, and/or weight based adjustment of the mA/kV was utilized to reduce the radiation dose to as low as reasonably achievable. COMPARISON: None available. CLINICAL HISTORY: Mental status change, unknown cause. AMS, SESPSIS. FINDINGS: BRAIN AND VENTRICLES: There is mild cerebral atrophy, small vessel disease, and atrophic ventriculomegaly. There are multiple scattered subcentimeter parafalcine lipomas along the frontal falx. Cerebellum and brainstem are unremarkable. No cortical-based acute infarct or hemorrhage. No extra-axial collection. No mass effect or midline shift. Basal cisterns are clear. There are patchy calcifications of the carotid siphons. No hyperdense central vessels. ORBITS: The orbits are unremarkable. SINUSES: The sinuses are clear. SOFT TISSUES AND SKULL: No acute soft tissue abnormality. No skull fractures or focal skull lesions noted. Diffuse bilateral mastoid effusions. Both middle ear cavities remain clear. IMPRESSION: 1. No acute intracranial CT findings. Mild cerebral atrophy and small vessel changes. 2. Multiple subcentimeter parafalcine lipomas. 3. Diffuse bilateral mastoid effusions. Clear sinuses and middle ear cavities. Electronically signed by: Francis Quam MD 10/06/2024 11:02 PM EDT RP Workstation: [id number]   DG CHEST PORT 1 VIEW Result Date: 10/06/2024 CLINICAL DATA:  PICC placement EXAM: PORTABLE CHEST 1 VIEW COMPARISON:  10/06/2024 FINDINGS: Right PICC line placement with the tip in the  SVC. New complete opacification of the left hemithorax. Interstitial prominence throughout the right lung. This may reflect interstitial edema. No acute bony abnormality. IMPRESSION: Right PICC line tip in the SVC. New complete opacification of the left hemithorax. Interstitial prominence in the right lung may reflect interstitial edema. Electronically Signed   By: Franky Crease M.D.   On: 10/06/2024 19:08   Scheduled Meds:  Chlorhexidine  Gluconate Cloth  6 each Topical Daily   hydrocortisone sod succinate (SOLU-CORTEF) inj  100 mg Intravenous Q12H   ipratropium-albuterol   3 mL Nebulization TID   pantoprazole  (PROTONIX ) IV  40 mg Intravenous Q24H   sodium chloride  flush  10-40 mL Intracatheter Q12H   Continuous Infusions:  ceFEPime (MAXIPIME) IV Stopped (10/08/24 1007)   metronidazole  Stopped (10/08/24 1524)   norepinephrine  (LEVOPHED ) Adult infusion Stopped (10/07/24 1654)   vancomycin  Stopped (10/08/24 1237)    LOS: 2 days   Rendall Carwin M.D on 10/08/2024 at 5:53 PM  Go to [email or web address] - for contact info  [facility] - Office  [phone]  If 7PM-7AM, please contact night-coverage [email or web address] 10/08/2024, 5:53 PM

## 2024-10-08 NOTE — Progress Notes (Signed)
   10/08/24 1000  Spiritual Encounters  Type of Visit Initial  Care provided to: Patient  Referral source Other (comment) (Spiritual Consult)  Reason for visit Routine spiritual support  OnCall Visit No   Chaplain responded to a spiritual consult for prayer. The patient, Azaela stated I didn't ask for prayer  She presented as anxious. I assured her that we did not have to prayer and asked how I could support her. Mariesa declined. I wished her a peaceful day and as I departed she said thank you with much less anxiousness in her voice.   Carley Birmingham [facility]  [phone]

## 2024-10-08 NOTE — Progress Notes (Signed)
 Progress Note  RN called due to patient complaining of increased shortness of breath as well as increased coarse crackles and wheezing with patient requesting for breathing treatment.  Present treatment was provided. Chart was reviewed and patient was noted to have been admitted for severe sepsis with septic shock due to presumed NPM1/loculated pleural effusion POA Due to coarse crackles on auscultation, chest x-ray was ordered and showed: 1. Complete opacification of left hemithorax, increased since prior exam. 2. Trace right pleural effusion. 3. Interstitial opacities in right lung suggesting pulmonary edema.  Patient was on IV fluid at 143 mL/hr, this was held and IV Lasix  20 mg x 1 was given.  Physical Exam  Gen:- Awake Alert, and oriented x 3 HEENT:- [place].AT, No sclera icterus Neck-Supple Neck,No JVD,.  Lungs-diffuse Rales on auscultation CV- S1, S2 normal Abd-  +ve B.Sounds, Abd Soft, No tenderness,    Extremity/Skin:- +3  edema bilaterally Psych-affect is appropriate, oriented x3 Neuro-no new focal deficits, no tremors   Assessment and plan Pulmonary edema Trace right pleural effusion IV Lasix  20 mg x 1 was given with good diuresis of 1,100 ml Continue to monitor outpatient as she may need more diuresis  Total time:  33 minutes This includes time reviewing the chart including progress notes, labs, EKGs, taking medical decisions, ordering labs and documenting findings.  Please refer to admission H&P and progress notes regarding details of care of this patient

## 2024-10-09 ENCOUNTER — Inpatient Hospital Stay (HOSPITAL_COMMUNITY)

## 2024-10-09 DIAGNOSIS — J449 Chronic obstructive pulmonary disease, unspecified: Secondary | ICD-10-CM | POA: Diagnosis not present

## 2024-10-09 DIAGNOSIS — G9341 Metabolic encephalopathy: Secondary | ICD-10-CM | POA: Diagnosis not present

## 2024-10-09 DIAGNOSIS — L89156 Pressure-induced deep tissue damage of sacral region: Secondary | ICD-10-CM | POA: Diagnosis not present

## 2024-10-09 DIAGNOSIS — J9621 Acute and chronic respiratory failure with hypoxia: Secondary | ICD-10-CM | POA: Diagnosis not present

## 2024-10-09 LAB — CBC
HCT: 25.7 % — ABNORMAL LOW (ref 36.0–46.0)
Hemoglobin: 7.8 g/dL — ABNORMAL LOW (ref 12.0–15.0)
MCH: 27.3 pg (ref 26.0–34.0)
MCHC: 30.4 g/dL (ref 30.0–36.0)
MCV: 89.9 fL (ref 80.0–100.0)
Platelets: 263 K/uL (ref 150–400)
RBC: 2.86 MIL/uL — ABNORMAL LOW (ref 3.87–5.11)
RDW: 15.3 % (ref 11.5–15.5)
WBC: 10.8 K/uL — ABNORMAL HIGH (ref 4.0–10.5)
nRBC: 0 % (ref 0.0–0.2)

## 2024-10-09 LAB — RENAL FUNCTION PANEL
Albumin: 2.1 g/dL — ABNORMAL LOW (ref 3.5–5.0)
Anion gap: 9 (ref 5–15)
BUN: 20 mg/dL (ref 8–23)
CO2: 29 mmol/L (ref 22–32)
Calcium: 9.4 mg/dL (ref 8.9–10.3)
Chloride: 97 mmol/L — ABNORMAL LOW (ref 98–111)
Creatinine, Ser: 0.78 mg/dL (ref 0.44–1.00)
GFR, Estimated: >60 mL/min (ref >60)
Glucose, Bld: 87 mg/dL (ref 70–99)
Phosphorus: 3 mg/dL (ref 2.5–4.6)
Potassium: 3.2 mmol/L — ABNORMAL LOW (ref 3.5–5.1)
Sodium: 135 mmol/L (ref 135–145)

## 2024-10-09 LAB — BASIC METABOLIC PANEL WITH GFR
Anion gap: 8 (ref 5–15)
BUN: 19 mg/dL (ref 8–23)
CO2: 29 mmol/L (ref 22–32)
Calcium: 9.3 mg/dL (ref 8.9–10.3)
Chloride: 97 mmol/L — ABNORMAL LOW (ref 98–111)
Creatinine, Ser: 0.78 mg/dL (ref 0.44–1.00)
GFR, Estimated: >60 mL/min (ref >60)
Glucose, Bld: 86 mg/dL (ref 70–99)
Potassium: 3.3 mmol/L — ABNORMAL LOW (ref 3.5–5.1)
Sodium: 134 mmol/L — ABNORMAL LOW (ref 135–145)

## 2024-10-09 LAB — URINE CULTURE: Culture: 30000 — AB

## 2024-10-09 LAB — CYTOLOGY - NON PAP

## 2024-10-09 MED ORDER — LORAZEPAM 2 MG/ML IJ SOLN
0.5000 mg | Freq: Two times a day (BID) | INTRAMUSCULAR | Status: DC | PRN
  Administered 2024-10-09 – 2024-10-11 (×3): 0.5 mg via INTRAVENOUS
  Filled 2024-10-09 (×3): qty 1

## 2024-10-09 MED ORDER — SODIUM CHLORIDE 0.9 % IV SOLN
2.0000 g | Freq: Three times a day (TID) | INTRAVENOUS | Status: DC
  Administered 2024-10-09 – 2024-10-11 (×4): 2 g via INTRAVENOUS
  Filled 2024-10-09 (×5): qty 12.5

## 2024-10-09 MED ORDER — HEPARIN SODIUM (PORCINE) 5000 UNIT/ML IJ SOLN
5000.0000 [IU] | Freq: Three times a day (TID) | INTRAMUSCULAR | Status: DC
  Administered 2024-10-09 – 2024-10-11 (×6): 5000 [IU] via SUBCUTANEOUS
  Filled 2024-10-09 (×6): qty 1

## 2024-10-09 MED ORDER — LEVOTHYROXINE SODIUM 75 MCG PO TABS: 150.0000 ug | ORAL_TABLET | Freq: Every day | ORAL | Status: DC

## 2024-10-09 MED ORDER — LIDOCAINE HCL (PF) 2 % IJ SOLN
INTRAMUSCULAR | Status: AC
  Filled 2024-10-09: qty 10

## 2024-10-09 MED ORDER — LINEZOLID 600 MG/300ML IV SOLN
600.0000 mg | Freq: Two times a day (BID) | INTRAVENOUS | Status: DC
  Administered 2024-10-09 – 2024-10-11 (×4): 600 mg via INTRAVENOUS
  Filled 2024-10-09 (×7): qty 300

## 2024-10-09 MED ORDER — METRONIDAZOLE 500 MG/100ML IV SOLN
500.0000 mg | Freq: Two times a day (BID) | INTRAVENOUS | Status: AC
  Administered 2024-10-09: 500 mg via INTRAVENOUS
  Filled 2024-10-09: qty 100

## 2024-10-09 NOTE — Progress Notes (Signed)
 After radiology moved pt to complete chest x ray, pt became so anxious, c/o SOB.  HR in 130s, RR in upper 20s, SpO2 mid 80s.  Pt c/o pain to her buttocks. Breathing treatment given and Dilaudid  given. Symptoms improved, pt at the baseline, VS stable.  Will continue to monitor pt closely.

## 2024-10-09 NOTE — Procedures (Signed)
 PROCEDURE SUMMARY:  Successful image-guided left thoracentesis. Yielded 350 milliliters of clear, dark yellow fluid. Patient tolerated procedure well. EBL < 1 mL No immediate complications.  Specimen was not sent for labs. Post procedure CXR pending.  Please see imaging section of Epic for full dictation.  Clotilda DELENA Hesselbach PA-C 10/09/2024 4:06 PM

## 2024-10-09 NOTE — Progress Notes (Signed)
 CPAP machine unavailable.

## 2024-10-09 NOTE — Progress Notes (Signed)
 Pharmacy Antibiotic Note  Christina Castaneda is a 68 y.o. female admitted on 10/05/2024 with altered mental status.  Pharmacy has been consulted for vancomycin  and aztreonam dosing.  Patient with tmax of 100, wbc elevated at 24. Received initial antibiotics overnight. Patient with noted allergy  to pcn but has tolerated ancef , ceftriaxone , and zosyn  previously; will change aztreonam to cefepime and follow closely.   Continue Vancomycin  1 g IV Q 24 hrs. Goal AUC 400-550. Cefepime 2g q8h  Height: 5' 5 (165.1 cm) Weight: 103.1 kg (227 lb 4.7 oz) IBW/kg (Calculated) : 57  Temp (24hrs), Avg:98 F (36.7 C), Min:97.6 F (36.4 C), Max:98.4 F (36.9 C)  Recent Labs  Lab 10/05/24 2353 10/06/24 0038 10/06/24 0447 10/07/24 0500 10/08/24 0443 10/09/24 0332 10/09/24 0333  WBC 20.6*  --  24.2* 25.7* 13.6* 10.8*  --   CREATININE 1.01*  --  1.21* 1.08* 0.77 0.78 0.78  LATICACIDVEN 1.7 1.7  --   --   --   --   --     Estimated Creatinine Clearance: 77.9 mL/min (by C-G formula based on SCr of 0.78 mg/dL).    Allergies  Allergen Reactions   Penicillins Anaphylaxis, Hives, Shortness Of Breath and Swelling    Pt given ceftriaxone  05/27/24 in ED and tolerated   Biphosphate Other (See Comments)    Aching joints   Fluticasone  Other (See Comments)    Headaches    Vanc 10/14> Aztreonam/flagyl  x 1 Cefepime 10/14> Flagyl  10/14>  10/13 bldx2: GPR in 1/2 10/14 urine: VRE- contaminant/colonization  10/15 pleural: no org, wbc present, predominatly PMN - DZ says consistent with infxn MRSA PCR: neg   Thank you for allowing pharmacy to be a part of this patient's care.  Elspeth Sour, PharmD Clinical Pharmacist 10/09/2024 11:44 AM

## 2024-10-09 NOTE — Progress Notes (Signed)
 Patient currently refusing for sacral wound to be treated and changed. Ostomy pouch successfully changed. Patient is telling staff she does not want to be touched right now.

## 2024-10-09 NOTE — Progress Notes (Signed)
 Patient prepped and draped for thoracentesis. Family contacted by phone for consent. US  imaging obtained. Local anesthetic admin with no adverse reaction. Access obtained at L post thorax. Clear amber serous fluid retrieved under vacutainer suction. 350cc total removed. Access removed without complication. Bandage in place, no bleeding no hematoma.

## 2024-10-09 NOTE — Progress Notes (Addendum)
 PROGRESS NOTE  Christina Castaneda, is a 68 y.o. female, DOB - Aug 17, 1956, FMW:[id number]  Admit date - 10/05/2024   Admitting Physician Evalene GORMAN Sprinkles, MD  Outpatient Primary MD for the patient is [name], DO  LOS - 3  Chief Complaint  Patient presents with   Altered Mental Status       Brief Narrative:  67 y.o. female with medical history significant of perforated sigmoid colon status post hamartoma, essential hypertension, history of hemorrhagic shock secondary to perforated duodenal ulcer status post operative repair and embolization for recurrent bleeding July 2025, COPD, obesity, sleep apnea, stage II CKD admitted from SNF facility on 10/06/2024 with severe sepsis septic shock due to empyema with acute hypoxic respiratory failure- --Transfer to [facility] for PCCM consult for chest tube and possible intervention for empyema, may need CT surgery consult    -Assessment and Plan: 1)Severe Sepsis with septic shock due to empyema/loculated pleural effusion-POA -pleural fluid cytology from 10/07/2024 persistent with empyema - Patient met sepsis criteria on admission --Patient underwent diagnostic Thora on 10/07/2024 200 mL removed due to loculated fluid unable to remove large amount of blood fluid--and Repeat Thora on 10/09/24 with removal of 350 ml of pleural fluid - -Cytology from 10/07/24 pleural fluid shows --No malignant cells identified, Acute inflammation with necrotic debris compatible with an empyema --Pleural fluid LDH 1,047,  serum LDH 111--consistent with exudative pleural effusion by lights criteria -A pleural fluid LDH >1,000 IU/L is also a poor prognostic factor in parapneumonic effusions and empyema, often indicating the need for chest tube drainage ---Transfer to [facility] for PCCM consult for chest tube and possible intervention for empyema, may need CT surgery consult -Pleural fluid Gram stain and culture NGTD -Pleural fluid cell count over 10,000 (97%  neutrophils) -Lactate WNL  - Treated with IV Vancomycin  and Cefepime as well as Flagyl  Blood cultures from 10/12/2024 Gram stain AEROBIC BOTTLE ONLY GRAM POSITIVE RODS Gram Stain --await final ID and sensitivity--may be contaminant -20 pathogen respiratory culture NGTD --weaned off Levophed   --c/n  hydrocortisone per protocol 50 mg IV every 6 hours for the next 5 days to help accelerate shock reversal and increased vasopressor free days WBC 24.2 >> 25.7 >>13.6>>10.8 ----- WBC may start trending back up again with steroids -- Vancomycin  switched to Zyvox due to VRE on urine culture from 10/05/2024 -Okay to Stop Flagyl  -Continue cefepime  -- On 10/09/2024 patient developed--Worsening respiratory status today, increasing dyspnea increasing oxygen requirement and confusional episodes with disorientation  2)Acute on chronic respiratory failure with hypoxia (HCC) Pleural Effusion  Noted recent admission July 2025 with respiratory failure requiring supplemental oxygen and partial lung collapse-was discharged on room air -Required nonrebreather bag transitioned to 10 L of oxygen --status postthoracentesis on 10/07/2024 and 10/09/2024 -Initially weaned down to 3 L of oxygen but now requiring up to 8 L of oxygen again as of 10/09/2024 -Okay to use BiPAP as needed - Antibiotics as above #1 --Discussed with Dr. Paula Alpghanim---  PCCM Team will see patient when patient arrives at [facility] -Will most likely need chest tube, may need CT surgery consult after initial evaluation by PCCM  3)VRE UTI--urine culture from 10/06/2024 with VRE--patient with severe sepsis and septic shock as above #1 Will Stop IV vancomycin ,  start Zyvox instead - 4)Social/Ethics----She is DNR/DNI without limitations to treatment per se -palliative care consult appreciated - patient and her husband requested DNR/DNI status - After further conversations with Mrs Ether Blush (  the chaplain) on  10/10/2023--- patient  request that we treat the treatable, including chest tube placement and further CT surgery and PCCM interventions if needed  5)Metabolic Encephalopathy--due to above Suspect likely multifactorial with contributions of toxic metabolic etiologies as well as concurrent sepsis Grossly nonfocal neuroexam -Mentation initially improved however on 10/09/2024 patient becoming more confused and more disoriented - 6)Acute on chronic anemia--- hemoglobin is down to around 8.0 from a baseline usually above 10 - Suspect some component of hemodilution from IV fluid - No bleeding concerns-monitor closely  7)Pressure injury of skin Noted stage 3-4 sacral ulcer on evaluation  -Wound care consult appreciated - Antibiotics as above #1  8)COPD  GOLD 2/AB Noted decompensated respiratory status in the setting of sepsis and  Concurrent pleural effusion/Empyema   Does not appear to be an active COPD exacerbation at present -Solu-Cortef as above #1 for sepsis As needed DuoNeb On antibiotic coverage  in the setting of sepsis and empyema concerns as above  9)AKI----acute kidney injury-due to dehydration and sepsis shock with hypotension and poor perfusion -  creatinine on admission= 1.0 , - baseline creatinine = 0.7 to 0.8 in July 2025,  creatinine is now= back to baseline at 0.78 --renally adjust medications, avoid nephrotoxic agents / dehydration  / hypotension  10)H/o Perforated bowel (HCC) Noted history of perforated duodenal ulcer during admission 05/2024-06/2024  S/p general  surgery on 06/15/2024 and GDA embolization  06/25/2024 No reports of active bleeding at present  Hgb stable at 10.3 (baseline)  Hold AC given history  IV PPI  Monitor  11)Class 2 Obesity/OSA (obstructive sleep apnea) -Body mass index is 37.82 kg/m. -- Complicates overall care --CPAP or BiPAP prn and nightly  12)Dysphagia On dysphagia 3 diet from 06/2024 admission  Continue   13) hypothyroidism--TSH was 23 on 07/04/2024,  continue atorvastatin -Recheck TSH as outpatient when patient is no longer acutely ill  14)Mild Hypokalemia--replace and recheck  NB!! 68 yr old female with HTN, HLD, COPD, OSA, chronic hypoxic respiratory failure, hypothyroidism, gout, and prior h/o perforated duodenal ulcer admitted with severe sepsis and acute hypoxic respiratory failure due to loculated left-sided pleural effusion with presumed empyema-- -weaned off Levophed  -She is DNR/DNI without limitations to treatment per se -status postthoracentesis on 10/07/2024 and 10/09/2024 -Discussed with Dr. Paula Alpghanim--- please notify PCCM Team when patient arrives at [facility] -Will most likely need chest tube, may need CT surgery consult after initial evaluation by PCCM  Procedures:- --status postthoracentesis on 10/07/2024 and 10/09/2024  Status is: Inpatient   Disposition: The patient is from: SNF--[facility]              Anticipated d/c is to: SNF              Anticipated d/c date is: > 3 days              Patient currently is not medically stable to d/c. Barriers: Not Clinically Stable-   Code Status :  -  Code Status: Limited: Do not attempt resuscitation (DNR) -DNR-LIMITED -Do Not Intubate/DNI    Family Communication:    Discussed with Husband I Called Husband at [phone]--updated him , questions answered  DVT Prophylaxis  :   - SCDs      Lab Results  Component Value Date   PLT 263 10/09/2024    Inpatient Medications  Scheduled Meds:  Chlorhexidine  Gluconate Cloth  6 each Topical Daily   hydrocortisone sod succinate (SOLU-CORTEF) inj  100 mg Intravenous Q12H   ipratropium-albuterol   3 mL  Nebulization TID   [START ON 10/10/2024] levothyroxine   150 mcg Oral Q0600   pantoprazole  (PROTONIX ) IV  40 mg Intravenous Q24H   sodium chloride  flush  10-40 mL Intracatheter Q12H   Continuous Infusions:  ceFEPime (MAXIPIME) IV     linezolid (ZYVOX) IV     [START ON 10/10/2024] metronidazole      PRN  Meds:.HYDROmorphone  (DILAUDID ) injection, ipratropium-albuterol , LORazepam, ondansetron  **OR** ondansetron  (ZOFRAN ) IV, mouth rinse, mouth rinse, oxyCODONE , sodium chloride  flush   Anti-infectives (From admission, onward)    Start     Dose/Rate Route Frequency Ordered Stop   10/10/24 0000  metroNIDAZOLE  (FLAGYL ) IVPB 500 mg        500 mg 100 mL/hr over 60 Minutes Intravenous Every 12 hours 10/09/24 1841 10/10/24 0300   10/09/24 2200  linezolid (ZYVOX) IVPB 600 mg        600 mg 300 mL/hr over 60 Minutes Intravenous Every 12 hours 10/09/24 1834     10/09/24 2000  ceFEPIme (MAXIPIME) 2 g in sodium chloride  0.9 % 100 mL IVPB        2 g 200 mL/hr over 30 Minutes Intravenous Every 8 hours 10/09/24 1141     10/06/24 1200  metroNIDAZOLE  (FLAGYL ) IVPB 500 mg  Status:  Discontinued        500 mg 100 mL/hr over 60 Minutes Intravenous Every 12 hours 10/06/24 0933 10/09/24 1841   10/06/24 1200  vancomycin  (VANCOCIN ) IVPB 1000 mg/200 mL premix  Status:  Discontinued        1,000 mg 200 mL/hr over 60 Minutes Intravenous Every 24 hours 10/06/24 1039 10/09/24 1834   10/06/24 1130  ceFEPIme (MAXIPIME) 2 g in sodium chloride  0.9 % 100 mL IVPB  Status:  Discontinued        2 g 200 mL/hr over 30 Minutes Intravenous Every 12 hours 10/06/24 1039 10/09/24 1141   10/06/24 0145  aztreonam (AZACTAM) 2 g in sodium chloride  0.9 % 100 mL IVPB        2 g 200 mL/hr over 30 Minutes Intravenous  Once 10/06/24 0139 10/06/24 0215   10/06/24 0145  metroNIDAZOLE  (FLAGYL ) IVPB 500 mg        500 mg 100 mL/hr over 60 Minutes Intravenous  Once 10/06/24 0139 10/06/24 0252   10/06/24 0145  vancomycin  (VANCOCIN ) IVPB 1000 mg/200 mL premix        1,000 mg 200 mL/hr over 60 Minutes Intravenous  Once 10/06/24 0139 10/06/24 0251      Subjective: Donny Hilgeman today has no fevers, no emesis,  No chest pain,    -Worsening respiratory status today, increasing dyspnea increasing oxygen requirement and confusional episodes with  disorientation  -status postthoracentesis on 10/07/2024 and 10/09/2024 -Discussed with Dr. Paula Alpghanim--- please notify PCCM Team when patient arrives at [facility] -Will most likely need chest tube, may need CT surgery consult after initial evaluation by PCCM  I Called Husband at [phone]--updated him , questions answered  Objective: Vitals:   10/09/24 1600 10/09/24 1620 10/09/24 1700 10/09/24 1701  BP: (!) 146/101   107/61  Pulse: (!) 129  94 95  Resp:   18 17  Temp:  98.3 F (36.8 C)    TempSrc:  Axillary    SpO2: 100%  100% 100%  Weight:      Height:        Intake/Output Summary (Last 24 hours) at 10/09/2024 1903 Last data filed at 10/09/2024 1429 Gross per 24 hour  Intake --  Output 400 ml  Net -400 ml   Filed Weights   10/06/24 0002 10/07/24 0600  Weight: 108 kg 103.1 kg    Physical Exam  Gen:- Awake Alert, dyspnea with positional change  HEENT:- [place].AT, No sclera icterus Nose- [place] 8L/min Neck-Supple Neck,No JVD,.  Lungs-diminished on the left, no wheezing, tachypnea and increased work of breathing CV- S1, S2 normal, regular , tachycardia Abd-  +ve B.Sounds, Abd Soft, No tenderness, ostomy with fecal contents    Extremity/Skin:- No significant edema, pedal pulses present , buttock decubitus Psych-affect is anxious, episodes of confusion and disorientation Neuro-generalized weakness, no new focal deficits, no tremors  Data Reviewed: I have personally reviewed following labs and imaging studies  CBC: Recent Labs  Lab 10/05/24 2353 10/06/24 0447 10/07/24 0500 10/08/24 0443 10/09/24 0332  WBC 20.6* 24.2* 25.7* 13.6* 10.8*  NEUTROABS 19.0*  --   --   --   --   HGB 10.3* 9.4* 8.3* 8.0* 7.8*  HCT 34.0* 30.7* 26.8* 26.9* 25.7*  MCV 89.9 89.8 90.8 91.2 89.9  PLT 468* 445* 413* 288 263   Basic Metabolic Panel: Recent Labs  Lab 10/06/24 0447 10/07/24 0500 10/08/24 0443 10/09/24 0332 10/09/24 0333  NA 130* 129* 131* 134* 135  K 3.4* 4.0 3.5  3.3* 3.2*  CL 90* 93* 97* 97* 97*  CO2 28 29 29 29 29   GLUCOSE 117* 138* 88 86 87  BUN 22 23 19 19 20   CREATININE 1.21* 1.08* 0.77 0.78 0.78  CALCIUM  9.7 9.1 8.9 9.3 9.4  PHOS  --   --   --   --  3.0   GFR: Estimated Creatinine Clearance: 77.9 mL/min (by C-G formula based on SCr of 0.78 mg/dL). Liver Function Tests: Recent Labs  Lab 10/05/24 2353 10/07/24 0500 10/09/24 0333  AST 30 17  --   ALT 7 <5  --   ALKPHOS 96 86  --   BILITOT 0.3 0.3  --   PROT 7.1 5.7*  --   ALBUMIN  2.5* 2.1* 2.1*   Recent Results (from the past 240 hours)  Blood Culture (routine x 2)     Status: None (Preliminary result)   Collection Time: 10/05/24 11:53 PM   Specimen: BLOOD  Result Value Ref Range Status   Specimen Description   Final    BLOOD BLOOD RIGHT FOREARM Performed at [facility], [address], [place], [place] [address]    Special Requests   Final    BOTTLES DRAWN AEROBIC AND ANAEROBIC Blood Culture adequate volume Performed at [facility], [address], [place], [place] [address]    Culture  Setup Time   Final    AEROBIC BOTTLE ONLY GRAM POSITIVE RODS Gram Stain Report Called to,Read Back By and Verified WithBETHA JAYSON AHLE RN [phone] MARLA CAROLIN Performed at [facility], [address], [place], [place] [address]    Culture   Final    CULTURE REINCUBATED FOR BETTER GROWTH IDENTIFICATION TO FOLLOW Performed at [facility], [address] [place], [place] [address]    Report Status PENDING  Incomplete  Blood Culture (routine x 2)     Status: None (Preliminary result)   Collection Time: 10/06/24 12:38 AM   Specimen: BLOOD  Result Value Ref Range Status   Specimen Description BLOOD LEFT ANTECUBITAL  Final   Special Requests   Final    BOTTLES DRAWN AEROBIC AND ANAEROBIC Blood Culture results may not be optimal due to an inadequate volume of blood received in culture bottles  Culture   Final    NO GROWTH 3 DAYS Performed at [facility], [address] [place],  [place] [address]    Report Status PENDING  Incomplete  MRSA Next Gen by PCR, Nasal     Status: None   Collection Time: 10/06/24  4:22 AM   Specimen: Nasal Mucosa; Nasal Swab  Result Value Ref Range Status   MRSA by PCR Next Gen NOT DETECTED NOT DETECTED Final    Comment: (NOTE) The GeneXpert MRSA Assay (FDA approved for NASAL specimens only), is one component of a comprehensive MRSA colonization surveillance program. It is not intended to diagnose MRSA infection nor to guide or monitor treatment for MRSA infections. Test performance is not FDA approved in patients less than 61 years old. Performed at [facility], [address] [place], [place] [address]   Respiratory (~20 pathogens) panel by PCR     Status: None   Collection Time: 10/06/24 10:56 AM   Specimen: Nasopharyngeal Swab; Respiratory  Result Value Ref Range Status   Adenovirus NOT DETECTED NOT DETECTED Final   Coronavirus 229E NOT DETECTED NOT DETECTED Final    Comment: (NOTE) The Coronavirus on the Respiratory Panel, DOES NOT test for the novel  Coronavirus (2019 nCoV)    Coronavirus HKU1 NOT DETECTED NOT DETECTED Final   Coronavirus NL63 NOT DETECTED NOT DETECTED Final   Coronavirus OC43 NOT DETECTED NOT DETECTED Final   Metapneumovirus NOT DETECTED NOT DETECTED Final   Rhinovirus / Enterovirus NOT DETECTED NOT DETECTED Final   Influenza A NOT DETECTED NOT DETECTED Final   Influenza B NOT DETECTED NOT DETECTED Final   Parainfluenza Virus 1 NOT DETECTED NOT DETECTED Final   Parainfluenza Virus 2 NOT DETECTED NOT DETECTED Final   Parainfluenza Virus 3 NOT DETECTED NOT DETECTED Final   Parainfluenza Virus 4 NOT DETECTED NOT DETECTED Final   Respiratory Syncytial Virus NOT DETECTED NOT DETECTED Final   Bordetella pertussis NOT DETECTED NOT DETECTED Final   Bordetella Parapertussis NOT DETECTED NOT DETECTED Final   Chlamydophila pneumoniae NOT DETECTED NOT DETECTED Final   Mycoplasma pneumoniae NOT DETECTED NOT DETECTED Final     Comment: Performed at [facility], [address], [place], [place] [address]  Urine Culture     Status: Abnormal   Collection Time: 10/06/24 11:20 PM   Specimen: Urine, Random  Result Value Ref Range Status   Specimen Description   Final    URINE, RANDOM Performed at [facility], [address], [place], [place] [address]    Special Requests   Final    NONE Reflexed from [id number] Performed at [facility], [address] [place], [place] [address]    Culture (A)  Final    30,000 COLONIES/mL ENTEROCOCCUS FAECALIS VANCOMYCIN  RESISTANT ENTEROCOCCUS    Report Status 10/09/2024 FINAL  Final   Organism ID, Bacteria ENTEROCOCCUS FAECALIS (A)  Final      Susceptibility   Enterococcus faecalis - MIC*    AMPICILLIN <=2 SENSITIVE Sensitive     NITROFURANTOIN <=16 SENSITIVE Sensitive     VANCOMYCIN  >=32 RESISTANT Resistant     * 30,000 COLONIES/mL ENTEROCOCCUS FAECALIS  Gram stain     Status: None   Collection Time: 10/07/24 12:45 PM   Specimen: Pleura  Result Value Ref Range Status   Specimen Description PLEURAL  Final   Special Requests PLEURAL  Final   Gram Stain   Final    NO ORGANISMS SEEN WBC PRESENT, PREDOMINANTLY PMN Performed at [facility],  [address] [place], [place] [address]    Report Status 10/07/2024 FINAL  Final  Culture, body fluid w Gram Stain-bottle     Status: None (Preliminary result)   Collection Time: 10/07/24 12:45 PM   Specimen: Pleura  Result Value Ref Range Status   Specimen Description PLEURAL LEFT  Final   Special Requests   Final    BOTTLES DRAWN AEROBIC AND ANAEROBIC Blood Culture adequate volume   Culture   Final    NO GROWTH 2 DAYS Performed at [facility], [address] [place], [place] [address]    Report Status PENDING  Incomplete    Radiology Studies: DG Chest Port 1 View Result Date: 10/09/2024 CLINICAL DATA:  Follow-up left pleural effusion. Recent thoracentesis. EXAM: PORTABLE CHEST 1 VIEW COMPARISON:  10/09/2024  FINDINGS: Moderate left pleural effusion is seen but significantly decreased in size since previous study. A small 5-10% left apical pneumothorax is seen. Cardiomegaly remains stable. Diffuse interstitial infiltrates are again seen, consistent with interstitial edema. IMPRESSION: Significantly decreased size of moderate left pleural effusion since previous study. Small 5-10% left apical pneumothorax. Stable cardiomegaly and diffuse interstitial edema. Electronically Signed   By: Norleen DELENA Kil M.D.   On: 10/09/2024 17:59   US  THORACENTESIS ASP PLEURAL SPACE W/IMG GUIDE Result Date: 10/09/2024 INDICATION: Patient movement with altered mental status, sepsis found to have loculated left pleural effusion status post 200 mL thoracentesis on 10/07/24. Request for therapeutic left thoracentesis EXAM: ULTRASOUND GUIDED LEFT THORACENTESIS MEDICATIONS: 5 mL 1% lidocaine  COMPLICATIONS: None immediate. PROCEDURE: An ultrasound guided thoracentesis was thoroughly discussed with the patient and questions answered. The benefits, risks, alternatives and complications were also discussed. The patient understands and wishes to proceed with the procedure. Written consent was obtained. Ultrasound was performed to localize and mark an adequate pocket of fluid in the left chest. The area was then prepped and draped in the normal sterile fashion. 1% Lidocaine  was used for local anesthesia. Under ultrasound guidance a 6 Fr Safe-T-Centesis catheter was introduced. Thoracentesis was performed. The catheter was removed and a dressing applied. FINDINGS: A total of approximately 350 mL of clear, dark yellow fluid was removed. IMPRESSION: Successful ultrasound guided left thoracentesis yielding 350 mL of pleural fluid. Performed by Clotilda Hesselbach, PA-C Electronically Signed   By: Ester Sides M.D.   On: 10/09/2024 16:14   DG CHEST PORT 1 VIEW Result Date: 10/09/2024 CLINICAL DATA:  Dyspnea. EXAM: PORTABLE CHEST 1 VIEW COMPARISON:   10/08/2024 FINDINGS: Persistent whiteout of the left hemithorax without substantial shift of cardiomediastinal anatomy. Subtle interstitial opacity in the right lung raises the question of edema. Hazy opacity over the right base may reflect layering pleural fluid. Right PICC line remains in place with tip overlying the lower SVC level. Telemetry leads overlie the chest. IMPRESSION: 1. Persistent whiteout of the left hemithorax without substantial shift of cardiomediastinal anatomy. 2. Subtle interstitial opacity in the right lung raises the question of edema. Electronically Signed   By: Camellia Candle M.D.   On: 10/09/2024 07:13   DG Chest 1 View Result Date: 10/08/2024 EXAM: 1 VIEW(S) XRAY OF THE CHEST 10/08/2024 04:39:00 AM COMPARISON: 10/07/2024 CLINICAL HISTORY: Shortness of breath FINDINGS: LINES, TUBES AND DEVICES: Stable right PICC line. LUNGS AND PLEURA: Complete opacification of left hemithorax, increased since prior exam. Interstitial opacities in right lung suggesting pulmonary edema. Trace right pleural effusion. No pneumothorax. HEART AND MEDIASTINUM: No acute abnormality of the cardiac and mediastinal silhouettes. BONES AND SOFT TISSUES: No acute osseous abnormality. IMPRESSION:  1. Complete opacification of left hemithorax, increased since prior exam. 2. Trace right pleural effusion. 3. Interstitial opacities in right lung suggesting pulmonary edema. Electronically signed by: Evalene Coho MD 10/08/2024 04:56 AM EDT RP Workstation: [id number]   Scheduled Meds:  Chlorhexidine  Gluconate Cloth  6 each Topical Daily   hydrocortisone sod succinate (SOLU-CORTEF) inj  100 mg Intravenous Q12H   ipratropium-albuterol   3 mL Nebulization TID   [START ON 10/10/2024] levothyroxine   150 mcg Oral Q0600   pantoprazole  (PROTONIX ) IV  40 mg Intravenous Q24H   sodium chloride  flush  10-40 mL Intracatheter Q12H   Continuous Infusions:  ceFEPime (MAXIPIME) IV     linezolid (ZYVOX) IV     [START ON  10/10/2024] metronidazole       LOS: 3 days   Rendall Carwin M.D on 10/09/2024 at 7:03 PM  Go to [email or web address] - for contact info  [facility] - Office  [phone]  If 7PM-7AM, please contact night-coverage [email or web address] 10/09/2024, 7:03 PM

## 2024-10-09 NOTE — Progress Notes (Signed)
 Nurse to nurse report called to accepting RN at [facility]. [facility] transport en rout.

## 2024-10-09 NOTE — Progress Notes (Signed)
 PT Cancellation Note  Patient Details Name: Christina Castaneda MRN: [id number] DOB: 1956-01-13   Cancelled Treatment:    Reason Eval/Treat Not Completed: Medical issues which prohibited therapy. Patient refusing most care not appropriate for therapy at this time due to medical decline.   2:32 PM, 10/09/24 Lynwood Music, MPT Physical Therapist with [facility] [phone] office [phone] mobile phone

## 2024-10-09 NOTE — Progress Notes (Addendum)
 Pt admitted to rm 25 from [facility]. Initiated tele. Obtained vs. Bed alarm put on.   Pt got yellow MEWS. Initiated yellow MEWs protocol. Passed it on for night shift nurse.   Paged [facility] to notify MD about pt was in the room.  Amado GORMAN Arabia, RN

## 2024-10-09 NOTE — Progress Notes (Signed)
 Patient became distressed, tachycardic, hypertensive, tachypnea, and disoriented . Spo2 mid 80s on 4LNC. Increased to [facility] and patient was given nebulizer. HR sustained in the 140s.  Oxycodone  and ativan given. Patient respirations now regular and unlabored with Spo2 stabilized. HR remains in the 130s; BP 168/130.  MD made aware and came to bedside. See all new orders.

## 2024-10-10 ENCOUNTER — Inpatient Hospital Stay (HOSPITAL_COMMUNITY)

## 2024-10-10 DIAGNOSIS — J449 Chronic obstructive pulmonary disease, unspecified: Secondary | ICD-10-CM | POA: Diagnosis not present

## 2024-10-10 DIAGNOSIS — I2510 Atherosclerotic heart disease of native coronary artery without angina pectoris: Secondary | ICD-10-CM

## 2024-10-10 DIAGNOSIS — R7989 Other specified abnormal findings of blood chemistry: Secondary | ICD-10-CM

## 2024-10-10 DIAGNOSIS — A419 Sepsis, unspecified organism: Secondary | ICD-10-CM | POA: Diagnosis not present

## 2024-10-10 DIAGNOSIS — J9611 Chronic respiratory failure with hypoxia: Secondary | ICD-10-CM

## 2024-10-10 DIAGNOSIS — K631 Perforation of intestine (nontraumatic): Secondary | ICD-10-CM | POA: Diagnosis not present

## 2024-10-10 DIAGNOSIS — I10 Essential (primary) hypertension: Secondary | ICD-10-CM | POA: Diagnosis not present

## 2024-10-10 DIAGNOSIS — J869 Pyothorax without fistula: Secondary | ICD-10-CM

## 2024-10-10 DIAGNOSIS — R1310 Dysphagia, unspecified: Secondary | ICD-10-CM

## 2024-10-10 DIAGNOSIS — G4733 Obstructive sleep apnea (adult) (pediatric): Secondary | ICD-10-CM | POA: Diagnosis not present

## 2024-10-10 LAB — TROPONIN I (HIGH SENSITIVITY)
Troponin I (High Sensitivity): 946 ng/L (ref <18)
Troponin I (High Sensitivity): 995 ng/L (ref <18)

## 2024-10-10 LAB — CBC
HCT: 28.6 % — ABNORMAL LOW (ref 36.0–46.0)
HCT: 28 % — ABNORMAL LOW (ref 36.0–46.0)
Hemoglobin: 8.8 g/dL — ABNORMAL LOW (ref 12.0–15.0)
Hemoglobin: 8.5 g/dL — ABNORMAL LOW (ref 12.0–15.0)
MCH: 27.2 pg (ref 26.0–34.0)
MCH: 27.2 pg (ref 26.0–34.0)
MCHC: 30.8 g/dL (ref 30.0–36.0)
MCHC: 30.4 g/dL (ref 30.0–36.0)
MCV: 88.5 fL (ref 80.0–100.0)
MCV: 89.7 fL (ref 80.0–100.0)
Platelets: 328 K/uL (ref 150–400)
Platelets: 331 K/uL (ref 150–400)
RBC: 3.23 MIL/uL — ABNORMAL LOW (ref 3.87–5.11)
RBC: 3.12 MIL/uL — ABNORMAL LOW (ref 3.87–5.11)
RDW: 15.5 % (ref 11.5–15.5)
RDW: 15.6 % — ABNORMAL HIGH (ref 11.5–15.5)
WBC: 10.4 K/uL (ref 4.0–10.5)
WBC: 9.1 K/uL (ref 4.0–10.5)
nRBC: 0 % (ref 0.0–0.2)
nRBC: 0 % (ref 0.0–0.2)

## 2024-10-10 LAB — BASIC METABOLIC PANEL WITH GFR
Anion gap: 13 (ref 5–15)
BUN: 20 mg/dL (ref 8–23)
CO2: 26 mmol/L (ref 22–32)
Calcium: 9.6 mg/dL (ref 8.9–10.3)
Chloride: 95 mmol/L — ABNORMAL LOW (ref 98–111)
Creatinine, Ser: 0.98 mg/dL (ref 0.44–1.00)
GFR, Estimated: >60 mL/min (ref >60)
Glucose, Bld: 101 mg/dL — ABNORMAL HIGH (ref 70–99)
Potassium: 3 mmol/L — ABNORMAL LOW (ref 3.5–5.1)
Sodium: 134 mmol/L — ABNORMAL LOW (ref 135–145)

## 2024-10-10 LAB — MAGNESIUM: Magnesium: 1.6 mg/dL — ABNORMAL LOW (ref 1.7–2.4)

## 2024-10-10 LAB — RENAL FUNCTION PANEL
Albumin: <1.5 g/dL — ABNORMAL LOW (ref 3.5–5.0)
Anion gap: 10 (ref 5–15)
BUN: 19 mg/dL (ref 8–23)
CO2: 27 mmol/L (ref 22–32)
Calcium: 9.3 mg/dL (ref 8.9–10.3)
Chloride: 96 mmol/L — ABNORMAL LOW (ref 98–111)
Creatinine, Ser: 0.93 mg/dL (ref 0.44–1.00)
GFR, Estimated: >60 mL/min (ref >60)
Glucose, Bld: 91 mg/dL (ref 70–99)
Phosphorus: 2.7 mg/dL (ref 2.5–4.6)
Potassium: 3.1 mmol/L — ABNORMAL LOW (ref 3.5–5.1)
Sodium: 133 mmol/L — ABNORMAL LOW (ref 135–145)

## 2024-10-10 LAB — CULTURE, BLOOD (ROUTINE X 2): Special Requests: ADEQUATE

## 2024-10-10 LAB — CHOLESTEROL, BODY FLUID: Cholesterol, Fluid: 61 mg/dL

## 2024-10-10 LAB — BRAIN NATRIURETIC PEPTIDE: B Natriuretic Peptide: 791 pg/mL — ABNORMAL HIGH (ref 0.0–100.0)

## 2024-10-10 MED ORDER — ARFORMOTEROL TARTRATE 15 MCG/2ML IN NEBU
15.0000 ug | INHALATION_SOLUTION | Freq: Two times a day (BID) | RESPIRATORY_TRACT | Status: DC
  Administered 2024-10-10 – 2024-10-11 (×3): 15 ug via RESPIRATORY_TRACT
  Filled 2024-10-10 (×3): qty 2

## 2024-10-10 MED ORDER — METHYLPREDNISOLONE SODIUM SUCC 125 MG IJ SOLR
80.0000 mg | Freq: Every day | INTRAMUSCULAR | Status: DC
  Administered 2024-10-10 – 2024-10-11 (×2): 80 mg via INTRAVENOUS
  Filled 2024-10-10 (×2): qty 2

## 2024-10-10 MED ORDER — ALBUTEROL SULFATE (2.5 MG/3ML) 0.083% IN NEBU
2.5000 mg | INHALATION_SOLUTION | RESPIRATORY_TRACT | Status: DC
  Administered 2024-10-10 – 2024-10-11 (×6): 2.5 mg via RESPIRATORY_TRACT
  Filled 2024-10-10 (×7): qty 3

## 2024-10-10 MED ORDER — REVEFENACIN 175 MCG/3ML IN SOLN
175.0000 ug | Freq: Every day | RESPIRATORY_TRACT | Status: DC
  Administered 2024-10-10 – 2024-10-11 (×2): 175 ug via RESPIRATORY_TRACT
  Filled 2024-10-10 (×2): qty 3

## 2024-10-10 MED ORDER — BUDESONIDE 0.5 MG/2ML IN SUSP
0.5000 mg | Freq: Two times a day (BID) | RESPIRATORY_TRACT | Status: DC
  Administered 2024-10-10 – 2024-10-11 (×3): 0.5 mg via RESPIRATORY_TRACT
  Filled 2024-10-10 (×3): qty 2

## 2024-10-10 MED ORDER — FUROSEMIDE 10 MG/ML IJ SOLN
40.0000 mg | Freq: Once | INTRAMUSCULAR | Status: AC
  Administered 2024-10-10: 40 mg via INTRAVENOUS
  Filled 2024-10-10: qty 4

## 2024-10-10 MED ORDER — MAGNESIUM SULFATE 2 GM/50ML IV SOLN
2.0000 g | Freq: Once | INTRAVENOUS | Status: AC
  Administered 2024-10-10: 2 g via INTRAVENOUS
  Filled 2024-10-10: qty 50

## 2024-10-10 MED ORDER — POTASSIUM CHLORIDE CRYS ER 20 MEQ PO TBCR: 40.0000 meq | EXTENDED_RELEASE_TABLET | ORAL | Status: AC

## 2024-10-10 MED ORDER — SODIUM CHLORIDE 3 % IN NEBU
4.0000 mL | INHALATION_SOLUTION | Freq: Two times a day (BID) | RESPIRATORY_TRACT | Status: DC
  Administered 2024-10-10 – 2024-10-11 (×3): 4 mL via RESPIRATORY_TRACT
  Filled 2024-10-10 (×4): qty 4

## 2024-10-10 MED FILL — Hydrocortisone Sodium Succinate PF For Inj 100 MG: 50.0000 mg | INTRAMUSCULAR | Qty: 1 | Status: AC

## 2024-10-10 NOTE — TOC Progression Note (Signed)
 Transition of Care [facility]) - Progression Note    Patient Details  Name: Christina Castaneda MRN: [id number] Date of Birth: February 19, 1956  Transition of Care [facility]) CM/SW Contact  Luise JAYSON Pan, LCSWA Phone Number: 10/10/2024, 12:58 PM  Clinical Narrative:  [facility] can accept patient back fro [facility], per Admissions director. CSW will need to submit for auth when patient is medically stable.   Palliative consult placed 10/14. CSW following for discharge plans.   CSW will continue to follow.    Expected Discharge Plan: Skilled Nursing Facility Barriers to Discharge: Continued Medical Work up               Expected Discharge Plan and Services In-house Referral: Clinical Social Work Discharge Planning Services: CM Consult Post Acute Care Choice: Skilled Nursing Facility                                         Social Drivers of Health (SDOH) Interventions SDOH Screenings   Food Insecurity: Patient Unable To Answer (10/06/2024)  Housing: Patient Unable To Answer (10/06/2024)  Transportation Needs: Patient Unable To Answer (10/06/2024)  Utilities: Patient Unable To Answer (10/06/2024)  Social Connections: Unknown (10/06/2024)  Tobacco Use: High Risk (10/07/2024)    Readmission Risk Interventions    10/07/2024   10:14 AM 05/27/2024   10:01 PM  Readmission Risk Prevention Plan  Post Dischage Appt  Complete  Medication Screening  Complete  Transportation Screening Complete Complete  HRI or Home Care Consult Complete   Social Work Consult for Recovery Care Planning/Counseling Complete   Palliative Care Screening Not Applicable   Medication Review Oceanographer) Complete

## 2024-10-10 NOTE — Progress Notes (Signed)
 CCMD called, pt pulled off oxygen sensor. Replaced.

## 2024-10-10 NOTE — Progress Notes (Signed)
 Called by RRT, pt with episode of desaturation and increased WOB with abd accessory use. Repeat CXR shows slight worsening of left pleural effusion with atelectasis, no ptx.  Attempted BiPAP but pt did not tolerate.  Given ativan 0.5mg  and still not allowing bipap, also refused to be turned and took off her neb.  Pt does appeared more labored but still awake, hollering air off.  Is allowing salter HFNC.   - can try HHFNC for more WOB support if she continues to not allow BiPAP - IR consulted for CT placement - ongoing nebs/ CPT as pt allows - ok to remain in current PCU - ongoing GOC with husband if pt continues to not allow therapies    Lyle Pesa, NP [facility] 10/10/2024, 12:41 PM  See Amion for pager If no response to pager , please call [phone] until 7pm After 7:00 pm call Elink  [phone]

## 2024-10-10 NOTE — Progress Notes (Signed)
 RT note. RT attempt to place patient on bipap at a earlier time. Patient kept ripping face mask off. RN also admin. Ativan to try to see if patient would tolerate bipap. Patient was taking it off still. Patient placed on HHFNC at this time 50L-60% sat 100. Labored breathing still noted but patient tolerating better.  Attempt x2 abg, unable to get. Will let RN/MD know.   10/10/24 1316  Therapy Vitals  Pulse Rate (!) 116  Resp (!) 28  MEWS Score/Color  MEWS Score 4  MEWS Score Color Red  Respiratory Assessment  Assessment Type Pre-treatment  Respiratory Pattern Regular;Labored;Accessory muscle use  Chest Assessment Chest expansion symmetrical  Cough Weak  Bilateral Breath Sounds Diminished  Oxygen Therapy/Pulse Ox  SpO2 100 %  O2 Device (S)  HHFNC  O2 Therapy Oxygen humidified  O2 Flow Rate (L/min) 50 L/min  FiO2 (%) 100 %  $ High Flow Nasal Cannula  Yes  Heated High Flow Nasal Cannula  Adult Medium  $ Adult Medium Yes  Heater temperature 87.8 F (31 C)

## 2024-10-10 NOTE — Progress Notes (Addendum)
 PROGRESS NOTE  Christina Castaneda FMW:[id number] DOB: [birth year]   PCP: [name], DO  Patient is from: SNF, [facility]  DOA: 10/05/2024 LOS: 4  Chief complaints Chief Complaint  Patient presents with   Altered Mental Status     Brief Narrative / Interim history: 68 year old F with PMH of COPD, OSA, hypoxic RF on 3 L, perforated sigmoid colon in the setting of diverticulitis s/p colostomy in 2017, duodenal ulcer perforation s/p ex lap and repair, prior tracheostomy, CKD-2 and morbid obesity admitted to [facility] from SNF facility on 10/06/2024 with severe sepsis septic shock due to empyema/loculated pleural effusion with acute hypoxic respiratory failure.   Patient was started on vasopressor, stress dose steroid, broad-spectrum antibiotics and admitted to ICU at [facility].  She underwent right thoracocentesis on 10/15 with removal of 200 cc exudative and culture negative amber-colored fluid She also underwent left thoracocentesis on 10/17 with removal of 350 cc clear of fluid.  No malignant cells in pleural fluid.  Patient was weaned off vasopressor but remained on stress dose steroid.  She has worsening respiratory distress and confusion.  After consultation with PCCM, she was transferred to [facility] for further evaluation.   Subjective: Seen and examined earlier this morning.  Patient has increased work of breathing.  She is also fighting  off the nurse take who was cleaning her.  She has not received any of her nebulizers.  She moves both arms but does not talk or respond to questions.  She is awake and alert.   Rapid response called about 20 to 30 minutes later.  When I returned to evaluate, patient seems to be breathing better than earlier.  There is some confusion among nursing staff.   Assessment and plan: Severe Sepsis with septic shock due to empyema/loculated pleural effusion-POA -Right Thora on 10/15- 200 cc exudative and culture negative fluid.   Cytology negative for malignant cells. -Left Thora on 10/17 -350 cc.  Fluid was not sent for testing -Blood cultures with corynebacterium species in 1 out of 4 bottles. -Pleural fluid culture NGTD. -Urine culture with 30,000 colonies of VRE. -Leukocytosis resolved. -Has been on broad-spectrum antibiotics since 10/14.  Currently on cefepime and Zyvox -Transferred to [facility] after consultation with PCCM - Appreciate help by PCCM.  Acute on chronic respiratory failure with hypoxia:  Bilateral pleural effusion/left empyema COPD exacerbation/OSA Patient had some work of breathing when I evaluated her earlier.  Part of that is because she was also fighting off staff who is caring for her.  Has history of tracheostomy.  I added Brovana , Pulmicort  and Yupelri.  About 30 minutes later, rapid response due to increased work of breathing.  Unclear what changed.  Patient seems to be breathing better when I arrived to reevaluate.  She just received DuoNebs and started on 15 L by HFNC. -Change Solu-Cortef to Solu-Medrol -Antibiotics as above -Brovana , Pulmicort  and Yupelri -DuoNebs as needed -CXR, ABG, BNP, EKG and troponin ordered.   -Advised RN to notify PCCM.   -Trial of BiPAP.  Patient is DNR. - Aspiration precaution  Addendum CXR with stable to slight worsening of large left pleural effusion with associated atelectasis.  PCCM consulted IR for chest tube placement.  IR wants to get a CT chest with contrast before chest tube placement.  RRT not able to obtain ABG.  VBG ordered and yet to be collected.  Troponin elevated to 946 and trended to 995.  EKG seems to show T wave inversions  in lateral leads but poor quality.  BNP elevated to 791.  Ordered a dose of IV Lasix  and echocardiogram.  Cardiology, Dr. Floretta consulted  Delirium-patient pulling off telemetry wires, oxygen sensor... - Restraints applied.  Attempted to call patient's husband for update but no answer.  Did not leave  voicemail   Acute metabolic encephalopathy/hospital delirium: Currently awake and alert but confused and refusing care.  Moving arms.  Does not move legs, likely chronic. - Reorientation and delirium precaution - Treat treatable causes  VRE UTI?  Unclear if she had UTI symptoms.   - On Zyvox.  6)Acute on chronic anemia: H&H stable after initial drop.  No overt bleeding. Recent Labs    07/07/24 0235 07/08/24 0520 07/09/24 0338 07/10/24 0306 10/05/24 2353 10/06/24 0447 10/07/24 0500 10/08/24 0443 10/09/24 0332 10/10/24 0920  HGB 10.3* 10.7* 10.8* 10.5* 10.3* 9.4* 8.3* 8.0* 7.8* 8.5*  - Continue monitor  Colostomy status-history of perforated sigmoid diverticulitis in 2017. -Continue colostomy care  History of perforated duodenal ulcer and bleeding s/p repair and GDA embolization on 06/25/2024 -Continue IV PPI  Dysphagia: On dysphagia 3 diet from 06/2024 admission  -SLP reevaluation  Hypothyroidism: TSH was 23 on 7/12. -Recheck TSH -Continue home Synthroid   Hypokalemia -Monitor replenish K and Mg as appropriate   AKI ruled out.  Has CKD 2.   Goal of care-DNR-Limited -Palliative medicine on board   Morbid obesity Body mass index is 37.82 kg/m.  Pressure skin injury: Present on admission Wound 10/06/24 0430 Pressure Injury Sacrum Mid Stage 4 - Full thickness tissue loss with exposed bone, tendon or muscle. (Active)   DVT prophylaxis:  heparin  injection 5,000 Units Start: 10/09/24 2200  Code Status: DNR-Limited Family Communication: Attempted to call patient's husband but no answer.  Did not leave voicemail. Level of care: Progressive Status is: Inpatient Remains inpatient appropriate because: Respiratory failure, respiratory distress, left lung empyema   Final disposition: Likely back to SNF   55 minutes with more than 50% spent in reviewing records, counseling patient/family and coordinating care.  Consultants:  Pulmonology Interventional  radiology  Procedures: 10/15-left thoracocentesis 10/17-right thoracocentesis  Microbiology summarized: 10/14-full RVP nonreactive 10/14-blood culture with corynebacterium species in 1 out of 4 bottles likely contaminant 10/15-pleural fluid culture NGTD  Objective: Vitals:   10/10/24 0225 10/10/24 0400 10/10/24 0740 10/10/24 0805  BP: 98/73 109/70 107/73   Pulse: 84 80 89 86  Resp: (!) 22 19 (!) 22 (!) 22  Temp:  97.9 F (36.6 C) 97.7 F (36.5 C)   TempSrc:  Axillary Oral   SpO2: 98% 97% 100% 98%  Weight:      Height:        Examination:  GENERAL: No apparent distress.  Nontoxic. HEENT: MMM.  Vision and hearing grossly intact.  NECK: Supple.  No apparent JVD.  RESP: Notable work of breathing.  Diminished aeration bilaterally. CVS:  RRR. Heart sounds normal.  ABD/GI/GU: BS+. Abd soft, NTND.  Colostomy bag with normal-looking stool.  Scars from prior surgeries. MSK/EXT:  Moves upper extremities.  Minimally moves lower extremities.  Difficult to assess fluid status NEURO: AA.  Not answering orientation questions.  Fighting off care.  No facial asymmetry.  Moves both upper extremities.  Minimally moves legs. PSYCH: Appears agitated partly due to nursing care.  Sch Meds:  Scheduled Meds:  arformoterol   15 mcg Nebulization BID   budesonide  (PULMICORT ) nebulizer solution  0.5 mg Nebulization BID   Chlorhexidine  Gluconate Cloth  6 each Topical Daily   heparin   injection (subcutaneous)  5,000 Units Subcutaneous Q8H   hydrocortisone sod succinate (SOLU-CORTEF) inj  50 mg Intravenous Q12H   levothyroxine   150 mcg Oral Q0600   pantoprazole  (PROTONIX ) IV  40 mg Intravenous Q24H   potassium chloride   40 mEq Oral Q3H   revefenacin  175 mcg Nebulization Daily   sodium chloride  flush  10-40 mL Intracatheter Q12H   Continuous Infusions:  ceFEPime (MAXIPIME) IV 2 g (10/10/24 0553)   linezolid (ZYVOX) IV Stopped (10/10/24 0058)   magnesium  sulfate bolus IVPB     PRN  Meds:.HYDROmorphone  (DILAUDID ) injection, ipratropium-albuterol , LORazepam, ondansetron  **OR** ondansetron  (ZOFRAN ) IV, mouth rinse, mouth rinse, oxyCODONE , sodium chloride  flush  Antimicrobials: Anti-infectives (From admission, onward)    Start     Dose/Rate Route Frequency Ordered Stop   10/10/24 0000  metroNIDAZOLE  (FLAGYL ) IVPB 500 mg        500 mg 100 mL/hr over 60 Minutes Intravenous Every 12 hours 10/09/24 1841 10/10/24 0014   10/09/24 2200  linezolid (ZYVOX) IVPB 600 mg        600 mg 300 mL/hr over 60 Minutes Intravenous Every 12 hours 10/09/24 1834     10/09/24 2000  ceFEPIme (MAXIPIME) 2 g in sodium chloride  0.9 % 100 mL IVPB        2 g 200 mL/hr over 30 Minutes Intravenous Every 8 hours 10/09/24 1141     10/06/24 1200  metroNIDAZOLE  (FLAGYL ) IVPB 500 mg  Status:  Discontinued        500 mg 100 mL/hr over 60 Minutes Intravenous Every 12 hours 10/06/24 0933 10/09/24 1841   10/06/24 1200  vancomycin  (VANCOCIN ) IVPB 1000 mg/200 mL premix  Status:  Discontinued        1,000 mg 200 mL/hr over 60 Minutes Intravenous Every 24 hours 10/06/24 1039 10/09/24 1834   10/06/24 1130  ceFEPIme (MAXIPIME) 2 g in sodium chloride  0.9 % 100 mL IVPB  Status:  Discontinued        2 g 200 mL/hr over 30 Minutes Intravenous Every 12 hours 10/06/24 1039 10/09/24 1141   10/06/24 0145  aztreonam (AZACTAM) 2 g in sodium chloride  0.9 % 100 mL IVPB        2 g 200 mL/hr over 30 Minutes Intravenous  Once 10/06/24 0139 10/06/24 0215   10/06/24 0145  metroNIDAZOLE  (FLAGYL ) IVPB 500 mg        500 mg 100 mL/hr over 60 Minutes Intravenous  Once 10/06/24 0139 10/06/24 0252   10/06/24 0145  vancomycin  (VANCOCIN ) IVPB 1000 mg/200 mL premix        1,000 mg 200 mL/hr over 60 Minutes Intravenous  Once 10/06/24 0139 10/06/24 0251        I have personally reviewed the following labs and images: CBC: Recent Labs  Lab 10/05/24 2353 10/06/24 0447 10/07/24 0500 10/08/24 0443 10/09/24 0332 10/10/24 0920  WBC  20.6* 24.2* 25.7* 13.6* 10.8* 9.1  NEUTROABS 19.0*  --   --   --   --   --   HGB 10.3* 9.4* 8.3* 8.0* 7.8* 8.5*  HCT 34.0* 30.7* 26.8* 26.9* 25.7* 28.0*  MCV 89.9 89.8 90.8 91.2 89.9 89.7  PLT 468* 445* 413* 288 263 331   BMP &GFR Recent Labs  Lab 10/07/24 0500 10/08/24 0443 10/09/24 0332 10/09/24 0333 10/10/24 0920  NA 129* 131* 134* 135 133*  K 4.0 3.5 3.3* 3.2* 3.1*  CL 93* 97* 97* 97* 96*  CO2 29 29 29 29 27   GLUCOSE 138* 88 86 87 91  BUN 23 19 19 20 19   CREATININE 1.08* 0.77 0.78 0.78 0.93  CALCIUM  9.1 8.9 9.3 9.4 9.3  MG  --   --   --   --  1.6*  PHOS  --   --   --  3.0 2.7   Estimated Creatinine Clearance: 67 mL/min (by C-G formula based on SCr of 0.93 mg/dL). Liver & Pancreas: Recent Labs  Lab 10/05/24 2353 10/07/24 0500 10/09/24 0333 10/10/24 0920  AST 30 17  --   --   ALT 7 <5  --   --   ALKPHOS 96 86  --   --   BILITOT 0.3 0.3  --   --   PROT 7.1 5.7*  --   --   ALBUMIN  2.5* 2.1* 2.1* <1.5*   No results for input(s): LIPASE, AMYLASE in the last 168 hours. Recent Labs  Lab 10/06/24 1055  AMMONIA <13   Diabetic: No results for input(s): HGBA1C in the last 72 hours. Recent Labs  Lab 10/06/24 0431  GLUCAP 124*   Cardiac Enzymes: No results for input(s): CKTOTAL, CKMB, CKMBINDEX, TROPONINI in the last 168 hours. No results for input(s): PROBNP in the last 8760 hours. Coagulation Profile: Recent Labs  Lab 10/05/24 2353  INR 1.2   Thyroid  Function Tests: No results for input(s): TSH, T4TOTAL, FREET4, T3FREE, THYROIDAB in the last 72 hours. Lipid Profile: No results for input(s): CHOL, HDL, LDLCALC, TRIG, CHOLHDL, LDLDIRECT in the last 72 hours. Anemia Panel: No results for input(s): VITAMINB12, FOLATE, FERRITIN, TIBC, IRON, RETICCTPCT in the last 72 hours. Urine analysis:    Component Value Date/Time   COLORURINE AMBER (A) 10/06/2024 2320   APPEARANCEUR CLOUDY (A) 10/06/2024 2320   LABSPEC  1.019 10/06/2024 2320   PHURINE 5.0 10/06/2024 2320   GLUCOSEU NEGATIVE 10/06/2024 2320   HGBUR MODERATE (A) 10/06/2024 2320   BILIRUBINUR NEGATIVE 10/06/2024 2320   KETONESUR NEGATIVE 10/06/2024 2320   PROTEINUR 100 (A) 10/06/2024 2320   NITRITE NEGATIVE 10/06/2024 2320   LEUKOCYTESUR LARGE (A) 10/06/2024 2320   Sepsis Labs: Invalid input(s): PROCALCITONIN, LACTICIDVEN  Microbiology: Recent Results (from the past 240 hours)  Blood Culture (routine x 2)     Status: Abnormal   Collection Time: 10/05/24 11:53 PM   Specimen: BLOOD  Result Value Ref Range Status   Specimen Description   Final    BLOOD BLOOD RIGHT FOREARM Performed at [facility], [address] [place], [place] [address]    Special Requests   Final    BOTTLES DRAWN AEROBIC AND ANAEROBIC Blood Culture adequate volume Performed at [facility], [address], [place], [place] [address]    Culture  Setup Time   Final    AEROBIC BOTTLE ONLY GRAM POSITIVE RODS Gram Stain Report Called to,Read Back By and Verified With: JAYSON AHLE RN [phone] MARLA CAROLIN Performed at [facility], [address], [place], [place] [address]    Culture (A)  Final    CORYNEBACTERIUM SPECIES CORYNEBACTERIUM OTITIDIS Performed at [facility], [address], [place], [place] [address]    Report Status 10/10/2024 FINAL  Final  Blood Culture (routine x 2)     Status: None (Preliminary result)   Collection Time: 10/06/24 12:38 AM   Specimen: BLOOD  Result Value Ref Range Status   Specimen Description BLOOD LEFT ANTECUBITAL  Final   Special Requests   Final    BOTTLES DRAWN AEROBIC AND ANAEROBIC Blood Culture results may not be optimal due to an  inadequate volume of blood received in culture bottles   Culture   Final    NO GROWTH 4 DAYS Performed at [facility], [address], [place], [place] [address]    Report Status PENDING  Incomplete  MRSA Next Gen by PCR, Nasal     Status: None   Collection Time: 10/06/24  4:22 AM    Specimen: Nasal Mucosa; Nasal Swab  Result Value Ref Range Status   MRSA by PCR Next Gen NOT DETECTED NOT DETECTED Final    Comment: (NOTE) The GeneXpert MRSA Assay (FDA approved for NASAL specimens only), is one component of a comprehensive MRSA colonization surveillance program. It is not intended to diagnose MRSA infection nor to guide or monitor treatment for MRSA infections. Test performance is not FDA approved in patients less than 19 years old. Performed at [facility], [address], [place], [place] [address]   Respiratory (~20 pathogens) panel by PCR     Status: None   Collection Time: 10/06/24 10:56 AM   Specimen: Nasopharyngeal Swab; Respiratory  Result Value Ref Range Status   Adenovirus NOT DETECTED NOT DETECTED Final   Coronavirus 229E NOT DETECTED NOT DETECTED Final    Comment: (NOTE) The Coronavirus on the Respiratory Panel, DOES NOT test for the novel  Coronavirus (2019 nCoV)    Coronavirus HKU1 NOT DETECTED NOT DETECTED Final   Coronavirus NL63 NOT DETECTED NOT DETECTED Final   Coronavirus OC43 NOT DETECTED NOT DETECTED Final   Metapneumovirus NOT DETECTED NOT DETECTED Final   Rhinovirus / Enterovirus NOT DETECTED NOT DETECTED Final   Influenza A NOT DETECTED NOT DETECTED Final   Influenza B NOT DETECTED NOT DETECTED Final   Parainfluenza Virus 1 NOT DETECTED NOT DETECTED Final   Parainfluenza Virus 2 NOT DETECTED NOT DETECTED Final   Parainfluenza Virus 3 NOT DETECTED NOT DETECTED Final   Parainfluenza Virus 4 NOT DETECTED NOT DETECTED Final   Respiratory Syncytial Virus NOT DETECTED NOT DETECTED Final   Bordetella pertussis NOT DETECTED NOT DETECTED Final   Bordetella Parapertussis NOT DETECTED NOT DETECTED Final   Chlamydophila pneumoniae NOT DETECTED NOT DETECTED Final   Mycoplasma pneumoniae NOT DETECTED NOT DETECTED Final    Comment: Performed at [facility], [address] [place], [place] [address]  Urine Culture     Status: Abnormal    Collection Time: 10/06/24 11:20 PM   Specimen: Urine, Random  Result Value Ref Range Status   Specimen Description   Final    URINE, RANDOM Performed at [facility], [address], [place], [place] [address]    Special Requests   Final    NONE Reflexed from [id number] Performed at [facility], [address] [place], [place] [address]    Culture (A)  Final    30,000 COLONIES/mL ENTEROCOCCUS FAECALIS VANCOMYCIN  RESISTANT ENTEROCOCCUS    Report Status 10/09/2024 FINAL  Final   Organism ID, Bacteria ENTEROCOCCUS FAECALIS (A)  Final      Susceptibility   Enterococcus faecalis - MIC*    AMPICILLIN <=2 SENSITIVE Sensitive     NITROFURANTOIN <=16 SENSITIVE Sensitive     VANCOMYCIN  >=32 RESISTANT Resistant     * 30,000 COLONIES/mL ENTEROCOCCUS FAECALIS  Gram stain     Status: None   Collection Time: 10/07/24 12:45 PM   Specimen: Pleura  Result Value Ref Range Status   Specimen Description PLEURAL  Final   Special Requests PLEURAL  Final   Gram Stain   Final    NO ORGANISMS  SEEN WBC PRESENT, PREDOMINANTLY PMN Performed at [facility], [address], [place], [place] [address]    Report Status 10/07/2024 FINAL  Final  Culture, body fluid w Gram Stain-bottle     Status: None (Preliminary result)   Collection Time: 10/07/24 12:45 PM   Specimen: Pleura  Result Value Ref Range Status   Specimen Description PLEURAL LEFT  Final   Special Requests   Final    BOTTLES DRAWN AEROBIC AND ANAEROBIC Blood Culture adequate volume   Culture   Final    NO GROWTH 3 DAYS Performed at [facility], [address], [place], [place] [address]    Report Status PENDING  Incomplete    Radiology Studies: DG CHEST PORT 1 VIEW Result Date: 10/10/2024 CLINICAL DATA:  Respiratory distress. EXAM: PORTABLE CHEST 1 VIEW COMPARISON:  10/09/2024 FINDINGS: Right-sided PICC line with tip over the SVC unchanged. Lungs are adequately inflated and demonstrate stable to slight worsening of a large left pleural  effusion with associated atelectasis. Right lung essentially clear. Cardiomediastinal silhouette and remainder the exam is unchanged. IMPRESSION: Stable to slight worsening of a large left pleural effusion with associated atelectasis. Electronically Signed   By: Toribio Agreste M.D.   On: 10/10/2024 11:48   DG Chest Port 1 View Result Date: 10/09/2024 CLINICAL DATA:  Follow-up left pleural effusion. Recent thoracentesis. EXAM: PORTABLE CHEST 1 VIEW COMPARISON:  10/09/2024 FINDINGS: Moderate left pleural effusion is seen but significantly decreased in size since previous study. A small 5-10% left apical pneumothorax is seen. Cardiomegaly remains stable. Diffuse interstitial infiltrates are again seen, consistent with interstitial edema. IMPRESSION: Significantly decreased size of moderate left pleural effusion since previous study. Small 5-10% left apical pneumothorax. Stable cardiomegaly and diffuse interstitial edema. Electronically Signed   By: Norleen DELENA Kil M.D.   On: 10/09/2024 17:59   US  THORACENTESIS ASP PLEURAL SPACE W/IMG GUIDE Result Date: 10/09/2024 INDICATION: Patient movement with altered mental status, sepsis found to have loculated left pleural effusion status post 200 mL thoracentesis on 10/07/24. Request for therapeutic left thoracentesis EXAM: ULTRASOUND GUIDED LEFT THORACENTESIS MEDICATIONS: 5 mL 1% lidocaine  COMPLICATIONS: None immediate. PROCEDURE: An ultrasound guided thoracentesis was thoroughly discussed with the patient and questions answered. The benefits, risks, alternatives and complications were also discussed. The patient understands and wishes to proceed with the procedure. Written consent was obtained. Ultrasound was performed to localize and mark an adequate pocket of fluid in the left chest. The area was then prepped and draped in the normal sterile fashion. 1% Lidocaine  was used for local anesthesia. Under ultrasound guidance a 6 Fr Safe-T-Centesis catheter was introduced.  Thoracentesis was performed. The catheter was removed and a dressing applied. FINDINGS: A total of approximately 350 mL of clear, dark yellow fluid was removed. IMPRESSION: Successful ultrasound guided left thoracentesis yielding 350 mL of pleural fluid. Performed by Clotilda Hesselbach, PA-C Electronically Signed   By: Ester Sides M.D.   On: 10/09/2024 16:14      Shadeed Colberg T. Dwain Huhn [facility]  If 7PM-7AM, please contact night-coverage [email or web address] 10/10/2024, 11:54 AM

## 2024-10-10 NOTE — Consult Note (Signed)
 Cardiology Consultation   Patient ID: Christina Castaneda MRN: [id number]; DOB: [birth year]  Admit date: 10/05/2024 Date of Consult: 10/10/2024  PCP:  [name], DO   [facility] Providers Cardiologist:  None        Patient Profile: Christina Castaneda is a 68 y.o. female with a hx of  Coronary artery disease CCTA 08/22/2022: CAC score 194 (84th percentile), nonobstructive CAD [LAD 1-24 proximal calcified, 1-24 mid to distal mixed, D1 1-24 next; LCx proximal and mid 1-24 calcified, OM1 1-24 calcified; RCA proximal and mid 1-24 calcified] TTE 04/09/2016: Mild LVH, EF 60-65, mild AI mild LAE Hypertension Hx of perforated sigmoid colon Hx of perforated duodenal ulcer c/b hemorrhagic shock, s/p repair and embolization 06/2024 Chronic Obstructive Pulmonary Disease OSA Chronic kidney disease Limited DNR  who is being seen 10/10/2024 for the evaluation of elevated Troponin at the request of Dr. Kathrin.  History of Present Illness: [name]  had a prolonged hospitalization in June-July 2025 with acute respiratory failure with lung collapse and mucous plugging, duodenal surgery with perforation and emergent surgical repair and embolization.  She was admitted 10/06/2024 to [facility] with septic shock, encephalopathy, hypoxic respiratory failure with O2 sats in the 80s on 6 L.  Chest x-ray demonstrated left-sided pleural effusion/presumed empyema.  She underwent left-sided thoracentesis 10/07/2024.  Patient declined chest tube placement with CT surgery.  She had repeat thoracentesis 10/09/2024. She was transferred to [facility] and seen by CCM. Her L effusion appears to be worsening w worsening respiratory status. She is pending chest tube placement with IR. Her hsTroponins are elevated. Cardiology is asked to evaluate.   Data K 3.1, Na 133, Cr 0.93, Magnesium  1.6, Hgb 8.5 hsTrop 946 >> 995 EKG: ST, HR 103, QTc 450, non-specific ST-TW changes   The patient remains  encephalopathic. She is unable to provide a history. She occasionally shouts, Close the door! She is in 4 point restraints.      Past Medical History:  Diagnosis Date   Asthma    mild   Atypical endometrial hyperplasia    COPD (chronic obstructive pulmonary disease) (HCC)    Goiter    Gout    Heart murmur    Hypertension    Hypothyroidism    Vitamin B12 deficiency    Yeast infection    for last 3 weeks    Past Surgical History:  Procedure Laterality Date   ABDOMINAL WALL DEFECT REPAIR N/A 04/18/2016   Procedure: CLOSURE OF ABDOMEN;  Surgeon: Debby Shipper, MD;  Location: [facility] OR;  Service: General;  Laterality: N/A;   APPENDECTOMY     APPLICATION OF WOUND VAC N/A 04/02/2016   Procedure: application of wound vac+;  Surgeon: Camellia Blush, MD;  Location: [facility] OR;  Service: General;  Laterality: N/A;   CHOLECYSTECTOMY  2009   gallstone removed   COLOSTOMY N/A 03/30/2016   Procedure: COLOSTOMY;  Surgeon: Donnice Bury, MD;  Location: [facility] OR;  Service: General;  Laterality: N/A;   COLOSTOMY REVISION N/A 03/28/2016   Procedure: COLOSTOMY REVISION;  Surgeon: Lynwood Pina, MD;  Location: [facility] OR;  Service: General;  Laterality: N/A;   COLOSTOMY REVISION N/A 03/30/2016   Procedure: COLON RESECTION LEFT;  Surgeon: Donnice Bury, MD;  Location: [facility] OR;  Service: General;  Laterality: N/A;   FEMUR IM NAIL Left 06/12/2024   Procedure: INSERTION, INTRAMEDULLARY ROD, FEMUR, RETROGRADE;  Surgeon: Onesimo Oneil LABOR, MD;  Location: [facility] ORS;  Service: Orthopedics;  Laterality: Left;   IR ANGIOGRAM  VISCERAL SELECTIVE  06/25/2024   IR ANGIOGRAM VISCERAL SELECTIVE  06/25/2024   IR ANGIOGRAM VISCERAL SELECTIVE  06/25/2024   IR EMBO ART  VEN HEMORR LYMPH EXTRAV  INC GUIDE ROADMAPPING  06/25/2024   IR US  GUIDE VASC ACCESS RIGHT  06/25/2024   LAPAROTOMY N/A 03/28/2016   Procedure: EXPLORATORY LAPAROTOMY;  Surgeon: Lynwood Pina, MD;  Location: [facility] OR;  Service: General;  Laterality: N/A;   LAPAROTOMY N/A  04/02/2016   Procedure: Re-exploration of open abdomen, application of abdominal wound vac;  Surgeon: Camellia Blush, MD;  Location: [facility] OR;  Service: General;  Laterality: N/A;   LAPAROTOMY N/A 04/04/2016   Procedure: EXPLORATORY LAPAROTOMY, PLACEMENT OF ABRA ABDOMINAL WALL CLOSURE SET ;  Surgeon: Camellia Blush, MD;  Location: [facility] OR;  Service: General;  Laterality: N/A;   LAPAROTOMY N/A 04/18/2016   Procedure: EXPLORATORY LAPAROTOMY;  Surgeon: Debby Shipper, MD;  Location: [facility] OR;  Service: General;  Laterality: N/A;   LAPAROTOMY N/A 06/15/2024   Procedure: LAPAROTOMY, EXPLORATORY OMENTAL GRAHAM PATCH REPAIR.;  Surgeon: Evonnie Dorothyann LABOR, DO;  Location: [facility];  Service: General;  Laterality: N/A;   LYSIS OF ADHESION N/A 06/15/2024   Procedure: LAPAROTOMY, FOR LYSIS OF ADHESIONS;  Surgeon: Evonnie Dorothyann LABOR, DO;  Location: [facility];  Service: General;  Laterality: N/A;   NECK SURGERY  1994   repair disk   OMENTECTOMY N/A 03/28/2016   Procedure: OMENTECTOMY;  Surgeon: Lynwood Pina, MD;  Location: [facility] OR;  Service: General;  Laterality: N/A;   ROBOTIC ASSISTED TOTAL HYSTERECTOMY WITH BILATERAL SALPINGO OOPHERECTOMY  11/18/2012   Procedure: ROBOTIC ASSISTED TOTAL HYSTERECTOMY WITH BILATERAL SALPINGO OOPHORECTOMY;  Surgeon: Elenore A. Dodie, MD;  Location: [facility] ORS;  Service: Gynecology;  Laterality: N/A;   TONSILLECTOMY     as a child   VACUUM ASSISTED CLOSURE CHANGE N/A 03/30/2016   Procedure: ABDOMINAL VAC CHANGE;  Surgeon: Donnice Bury, MD;  Location: [facility] OR;  Service: General;  Laterality: N/A;   WOUND DEBRIDEMENT N/A 03/28/2016   Procedure: DEBRIDEMENT WOUND;  Surgeon: Lynwood Pina, MD;  Location: [facility] OR;  Service: General;  Laterality: N/A;      Scheduled Meds:  albuterol   2.5 mg Nebulization Q4H   arformoterol   15 mcg Nebulization BID   budesonide  (PULMICORT ) nebulizer solution  0.5 mg Nebulization BID   Chlorhexidine  Gluconate Cloth  6 each Topical Daily   heparin  injection  (subcutaneous)  5,000 Units Subcutaneous Q8H   levothyroxine   150 mcg Oral Q0600   methylPREDNISolone (SOLU-MEDROL) injection  80 mg Intravenous Daily   pantoprazole  (PROTONIX ) IV  40 mg Intravenous Q24H   potassium chloride   40 mEq Oral Q3H   revefenacin  175 mcg Nebulization Daily   sodium chloride  flush  10-40 mL Intracatheter Q12H   sodium chloride  HYPERTONIC  4 mL Nebulization BID   Continuous Infusions:  ceFEPime (MAXIPIME) IV 2 g (10/10/24 0553)   linezolid (ZYVOX) IV 600 mg (10/10/24 1507)   magnesium  sulfate bolus IVPB 2 g (10/10/24 1650)   PRN Meds: HYDROmorphone  (DILAUDID ) injection, LORazepam, ondansetron  **OR** ondansetron  (ZOFRAN ) IV, mouth rinse, mouth rinse, oxyCODONE , sodium chloride  flush  Allergies:    Allergies  Allergen Reactions   Penicillins Anaphylaxis, Hives, Shortness Of Breath and Swelling    Pt given ceftriaxone  05/27/24 in ED and tolerated   Biphosphate Other (See Comments)    Aching joints   Dakin's [Sodium Hypochlorite] Other (See Comments)    Burns the skin   Fluticasone  Other (See Comments)    Headaches  Social History:   Social History   Socioeconomic History   Marital status: Married    Spouse name: Not on file   Number of children: Not on file   Years of education: Not on file   Highest education level: Not on file  Occupational History   Not on file  Tobacco Use   Smoking status: Every Day    Current packs/day: 1.50    Average packs/day: 1.5 packs/day for 28.0 years (42.0 ttl pk-yrs)    Types: Cigarettes   Smokeless tobacco: Never  Vaping Use   Vaping status: Never Used  Substance and Sexual Activity   Alcohol use: Yes    Alcohol/week: 1.0 - 3.0 standard drink of alcohol    Types: 1 - 3 Standard drinks or equivalent per week    Comment: 1-2 beers a week.   Drug use: No   Sexual activity: Yes    Partners: Male    Birth control/protection: Surgical    Comment: Hysterectomy  Other Topics Concern   Not on file  Social  History Narrative   Not on file   Social Drivers of Health   Financial Resource Strain: Not on file  Food Insecurity: Patient Unable To Answer (10/06/2024)   Hunger Vital Sign    Worried About Running Out of Food in the Last Year: Patient unable to answer    Ran Out of Food in the Last Year: Patient unable to answer  Transportation Needs: Patient Unable To Answer (10/06/2024)   PRAPARE - Transportation    Lack of Transportation (Medical): Patient unable to answer    Lack of Transportation (Non-Medical): Patient unable to answer  Physical Activity: Not on file  Stress: Not on file  Social Connections: Unknown (10/06/2024)   Social Connection and Isolation Panel    Frequency of Communication with Friends and Family: Patient unable to answer    Frequency of Social Gatherings with Friends and Family: Patient unable to answer    Attends Religious Services: Patient unable to answer    Active Member of Clubs or Organizations: Patient unable to answer    Attends Banker Meetings: Patient unable to answer    Marital Status: Married  Catering manager Violence: Patient Unable To Answer (10/06/2024)   Humiliation, Afraid, Rape, and Kick questionnaire    Fear of Current or Ex-Partner: Patient unable to answer    Emotionally Abused: Patient unable to answer    Physically Abused: Patient unable to answer    Sexually Abused: Patient unable to answer    Family History:   Family History  Problem Relation Age of Onset   COPD Mother    Diabetes Mother    Congestive Heart Failure Mother    Diabetes Father    Heart attack Father    Colon cancer Neg Hx    Colon polyps Neg Hx     ROS:  Please see the history of present illness.  All other ROS reviewed and negative.        Physical Exam/Data: Vitals:   10/10/24 1230 10/10/24 1316 10/10/24 1320 10/10/24 1600  BP:    113/66  Pulse:  (!) 116  (!) 121  Resp:  (!) 28  (!) 28  Temp: 98.2 F (36.8 C)   98.2 F (36.8 C)  TempSrc:  Oral   Oral  SpO2:  100% 100% 99%  Weight:      Height:        Intake/Output Summary (Last 24 hours) at 10/10/2024 1731 Last data  filed at 10/10/2024 0900 Gross per 24 hour  Intake 500.98 ml  Output 350 ml  Net 150.98 ml      10/07/2024    6:00 AM 10/06/2024   12:02 AM 07/29/2024    1:10 PM  Last 3 Weights  Weight (lbs) 227 lb 4.7 oz 238 lb 1.6 oz 238 lb  Weight (kg) 103.1 kg 108 kg 107.956 kg     Body mass index is 37.82 kg/m.  General:  Confused, restrained HEENT: high flow nasal canula in place Cardiac:  tachycardic, regular rhythm, no obvious murmur Lungs:  decreased breath sounds bilaterally   Abd: soft   Ext: 3+ edema both arms and legs  EKG:  The EKG was personally reviewed and demonstrates:  see HPI    Relevant CV Studies: Echocardiogram pending   Laboratory Data: High Sensitivity Troponin:   Recent Labs  Lab 10/10/24 1210 10/10/24 1323  TROPONINIHS 946* 995*     Chemistry Recent Labs  Lab 10/09/24 0332 10/09/24 0333 10/10/24 0920  NA 134* 135 133*  K 3.3* 3.2* 3.1*  CL 97* 97* 96*  CO2 29 29 27   GLUCOSE 86 87 91  BUN 19 20 19   CREATININE 0.78 0.78 0.93  CALCIUM  9.3 9.4 9.3  MG  --   --  1.6*  GFRNONAA >60 >60 >60  ANIONGAP 8 9 10     Recent Labs  Lab 10/05/24 2353 10/07/24 0500 10/09/24 0333 10/10/24 0920  PROT 7.1 5.7*  --   --   ALBUMIN  2.5* 2.1* 2.1* <1.5*  AST 30 17  --   --   ALT 7 <5  --   --   ALKPHOS 96 86  --   --   BILITOT 0.3 0.3  --   --    Lipids No results for input(s): CHOL, TRIG, HDL, LABVLDL, LDLCALC, CHOLHDL in the last 168 hours.  Hematology Recent Labs  Lab 10/08/24 0443 10/09/24 0332 10/10/24 0920  WBC 13.6* 10.8* 9.1  RBC 2.95* 2.86* 3.12*  HGB 8.0* 7.8* 8.5*  HCT 26.9* 25.7* 28.0*  MCV 91.2 89.9 89.7  MCH 27.1 27.3 27.2  MCHC 29.7* 30.4 30.4  RDW 15.7* 15.3 15.6*  PLT 288 263 331   Thyroid  No results for input(s): TSH, FREET4 in the last 168 hours.  BNP Recent Labs  Lab  10/10/24 1210  BNP 791.0*    DDimer  Recent Labs  Lab 10/06/24 1055  DDIMER >20.00*    Radiology/Studies:  DG CHEST PORT 1 VIEW Result Date: 10/10/2024 CLINICAL DATA:  Respiratory distress. EXAM: PORTABLE CHEST 1 VIEW COMPARISON:  10/09/2024 FINDINGS: Right-sided PICC line with tip over the SVC unchanged. Lungs are adequately inflated and demonstrate stable to slight worsening of a large left pleural effusion with associated atelectasis. Right lung essentially clear. Cardiomediastinal silhouette and remainder the exam is unchanged. IMPRESSION: Stable to slight worsening of a large left pleural effusion with associated atelectasis. Electronically Signed   By: Toribio Agreste M.D.   On: 10/10/2024 11:48   DG Chest Port 1 View Result Date: 10/09/2024 CLINICAL DATA:  Follow-up left pleural effusion. Recent thoracentesis. EXAM: PORTABLE CHEST 1 VIEW COMPARISON:  10/09/2024 FINDINGS: Moderate left pleural effusion is seen but significantly decreased in size since previous study. A small 5-10% left apical pneumothorax is seen. Cardiomegaly remains stable. Diffuse interstitial infiltrates are again seen, consistent with interstitial edema. IMPRESSION: Significantly decreased size of moderate left pleural effusion since previous study. Small 5-10% left apical pneumothorax. Stable cardiomegaly and diffuse interstitial edema.  Electronically Signed   By: Norleen DELENA Kil M.D.   On: 10/09/2024 17:59   US  THORACENTESIS ASP PLEURAL SPACE W/IMG GUIDE Result Date: 10/09/2024 INDICATION: Patient movement with altered mental status, sepsis found to have loculated left pleural effusion status post 200 mL thoracentesis on 10/07/24. Request for therapeutic left thoracentesis EXAM: ULTRASOUND GUIDED LEFT THORACENTESIS MEDICATIONS: 5 mL 1% lidocaine  COMPLICATIONS: None immediate. PROCEDURE: An ultrasound guided thoracentesis was thoroughly discussed with the patient and questions answered. The benefits, risks, alternatives  and complications were also discussed. The patient understands and wishes to proceed with the procedure. Written consent was obtained. Ultrasound was performed to localize and mark an adequate pocket of fluid in the left chest. The area was then prepped and draped in the normal sterile fashion. 1% Lidocaine  was used for local anesthesia. Under ultrasound guidance a 6 Fr Safe-T-Centesis catheter was introduced. Thoracentesis was performed. The catheter was removed and a dressing applied. FINDINGS: A total of approximately 350 mL of clear, dark yellow fluid was removed. IMPRESSION: Successful ultrasound guided left thoracentesis yielding 350 mL of pleural fluid. Performed by Clotilda Hesselbach, PA-C Electronically Signed   By: Ester Sides M.D.   On: 10/09/2024 16:14   DG CHEST PORT 1 VIEW Result Date: 10/09/2024 CLINICAL DATA:  Dyspnea. EXAM: PORTABLE CHEST 1 VIEW COMPARISON:  10/08/2024 FINDINGS: Persistent whiteout of the left hemithorax without substantial shift of cardiomediastinal anatomy. Subtle interstitial opacity in the right lung raises the question of edema. Hazy opacity over the right base may reflect layering pleural fluid. Right PICC line remains in place with tip overlying the lower SVC level. Telemetry leads overlie the chest. IMPRESSION: 1. Persistent whiteout of the left hemithorax without substantial shift of cardiomediastinal anatomy. 2. Subtle interstitial opacity in the right lung raises the question of edema. Electronically Signed   By: Camellia Candle M.D.   On: 10/09/2024 07:13   DG Chest 1 View Result Date: 10/08/2024 EXAM: 1 VIEW(S) XRAY OF THE CHEST 10/08/2024 04:39:00 AM COMPARISON: 10/07/2024 CLINICAL HISTORY: Shortness of breath FINDINGS: LINES, TUBES AND DEVICES: Stable right PICC line. LUNGS AND PLEURA: Complete opacification of left hemithorax, increased since prior exam. Interstitial opacities in right lung suggesting pulmonary edema. Trace right pleural effusion. No  pneumothorax. HEART AND MEDIASTINUM: No acute abnormality of the cardiac and mediastinal silhouettes. BONES AND SOFT TISSUES: No acute osseous abnormality. IMPRESSION: 1. Complete opacification of left hemithorax, increased since prior exam. 2. Trace right pleural effusion. 3. Interstitial opacities in right lung suggesting pulmonary edema. Electronically signed by: Evalene Coho MD 10/08/2024 04:56 AM EDT RP Workstation: [id number]   DG Chest Port 1 View Result Date: 10/07/2024 EXAM: 1 VIEW(S) XRAY OF THE CHEST 10/07/2024 01:16:00 PM COMPARISON: Yesterday's study. CLINICAL HISTORY: [id number] Status post thoracentesis [id number]. S/p thoracentesis. FINDINGS: LUNGS AND PLEURA: Large left pleural effusion is noted which is slightly decreased compared to prior exam. No pneumothorax. HEART AND MEDIASTINUM: No acute abnormality of the cardiac and mediastinal silhouettes. BONES AND SOFT TISSUES: No acute osseous abnormality. IMPRESSION: 1. Large left pleural effusion, slightly decreased from prior. 2. No pneumothorax. Electronically signed by: Lynwood Seip MD 10/07/2024 01:35 PM EDT RP Workstation: [id number]   US  THORACENTESIS ASP PLEURAL SPACE W/IMG GUIDE Result Date: 10/07/2024 INDICATION: 68 year old female. Extensive medical history. Found to have a left-sided pleural effusion. Request is for therapeutic and diagnostic thoracentesis EXAM: ULTRASOUND GUIDED THERAPEUTIC AND DIAGNOSTIC LEFT-SIDED THORACENTESIS MEDICATIONS: Lidocaine  1% 10 mL COMPLICATIONS: None immediate. PROCEDURE: An ultrasound guided thoracentesis was thoroughly discussed with the patient and  questions answered. The benefits, risks, alternatives and complications were also discussed. The patient understands and wishes to proceed with the procedure. Written consent was obtained. Ultrasound was performed to localize and mark an adequate pocket of fluid in the left chest. The area was then prepped and draped in the normal sterile fashion. 1%  Lidocaine  was used for local anesthesia. Under ultrasound guidance a 6 Fr Safe-T-Centesis catheter was introduced. Thoracentesis was performed. The catheter was removed and a dressing applied. FINDINGS: Found to have a loculated pleural effusion. A total of approximately 200 mL of amber colored fluid was removed. Samples were sent to the laboratory as requested by the clinical team. IMPRESSION: Successful ultrasound guided therapeutic and diagnostic right-sided thoracentesis yielding 200 mL of amber colored pleural fluid. Read by: Delon Beagle, NP Electronically Signed   By: Wilkie Lent M.D.   On: 10/07/2024 13:21   CT ABDOMEN PELVIS WO CONTRAST Result Date: 10/06/2024 EXAM: CT ABDOMEN AND PELVIS WITHOUT CONTRAST 10/06/2024 10:52:34 PM TECHNIQUE: CT of the abdomen and pelvis was performed without the administration of intravenous contrast. Multiplanar reformatted images are provided for review. Automated exposure control, iterative reconstruction, and/or weight-based adjustment of the mA/kV was utilized to reduce the radiation dose to as low as reasonably achievable. COMPARISON: CTA abdomen and pelvis 07/01/2024, CTA abdomen and pelvis 06/29/2024. CLINICAL HISTORY: Sepsis. AMS, SESPSIS. FINDINGS: LOWER CHEST: A large left pleural effusion is again noted, collapsing the lingular base and left lower lobe. A moderate right pleural effusion partially collapses the right lower lobe. Underlying pneumonia would be difficult to exclude. The mid to anterior right lung base is clear. Mild cardiomegaly. The lower mitral ring is heavily calcified. There is no substantial pericardial effusion. LIVER: There is slight nodularity of the left lobe of the liver concerning for early cirrhosis. The liver is mildly steatotic measuring 20 cm in length. No mass is seen without contrast. GALLBLADDER AND BILE DUCTS: The gallbladder is absent. No biliary ductal dilatation. SPLEEN: No focal abnormality of the unenhanced  spleen. PANCREAS: No focal abnormality of the unenhanced pancreas. ADRENAL GLANDS: There is no adrenal mass. KIDNEYS, URETERS AND BLADDER: No contour deforming mass of the unenhanced kidneys. Previously there was an irregular 4 cm cystic abnormality of the lower lateral right kidney, which is not seen today, and may have had an infectious etiology. Without contrast, I could not confirm full resolution of the abnormality. There is an 8 mm nonobstructive calculus in a calyceal infundibulum in the mid pole left kidney, smaller stones posterior to it measuring 4 mm and 2 mm. No further nephrolithiasis is seen. No hydronephrosis or ureteral stone. No perinephric or periureteral stranding. There are multiple tiny layering stones along the right posterior bladder wall. The bladder thickness is normal. Unsure if the stones were present previously because there was contrast in the bladder on both prior studies and bladder decompression due to catheterization. GI AND BOWEL: The stomach is contracted, unchanged. There previously was a drain in between the stomach and liver, which has been removed. There is a small stable duodenal diverticulum. 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 The small bowel is normal caliber. 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 A rather large ventral hernia is chronically seen with herniation of mesentery, small and large bowel with wide rectus diastasis.  The appendix has been removed. No bowel obstruction or inflammation is suspected. PERITONEUM AND RETROPERITONEUM: Trace ascites in the posterior pelvis similar to prior study. There is no free air, free hemorrhage, or abscess. VASCULATURE: Aorta is normal in caliber. There is moderate to heavy aortoiliac  calcification without aneurysm. Pelvic phleboliths. LYMPH NODES: No adenopathy is seen. REPRODUCTIVE ORGANS: No acute abnormality. The uterus is surgically absent. No adnexal mass. BONES AND SOFT TISSUES: Sacral decubitus ulcer is similar to prior exams. There is osteopenia, levoscoliosis, and advanced degenerative change of the lumbar spine. Multilevel lumbar acquired spinal stenosis. No acute osseous abnormality. No other  focal soft tissue abnormality. IMPRESSION: 1. Large left pleural effusion collapsing the lingular base and left lower lobe, and moderate right pleural effusion partially collapsing the right lower lobe. Underlying pneumonia would be difficult to exclude. 2. Slight nodularity of the left hepatic lobe concerning for early cirrhosis; mild hepatic steatosis. No hepatic mass identified on this noncontrast exam. 3. Nonobstructive left renal calculi measuring up to 8 mm; multiple small layering calculi along the right posterior bladder wall. No hydronephrosis or ureteral stone. 4. Advanced degenerative change of the lumbar spine with multilevel acquired spinal stenosis. SABRA 5. Prior finding of a 4 cm complex cystic abnormality of the lower lateral right kidney is not seen today, but full resolution is not confirmed without contrast. 6. 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 No bowel obstruction or inflammatory process identified. 7. Large ventral hernia containing mesentery and bowel with wide rectus diastasis. 8. Minimal pelvic ascites. Electronically signed by: Francis Quam MD 10/06/2024 11:30 PM EDT RP Workstation: [id number]   CT Head Wo Contrast Result Date: 10/06/2024 EXAM: CT HEAD WITHOUT CONTRAST 10/06/2024 10:52:34 PM TECHNIQUE: CT of the head was performed without the administration of intravenous contrast. Automated exposure control, iterative reconstruction, and/or weight based adjustment of the mA/kV was utilized to  reduce the radiation dose to as low as reasonably achievable. COMPARISON: None available. CLINICAL HISTORY: Mental status change, unknown cause. AMS, SESPSIS. FINDINGS: BRAIN AND VENTRICLES: There is mild cerebral atrophy, small vessel disease, and atrophic ventriculomegaly. There are multiple scattered subcentimeter parafalcine lipomas along the frontal falx. Cerebellum and brainstem are unremarkable. No cortical-based acute infarct or hemorrhage. No extra-axial collection. No mass effect or midline shift. Basal cisterns are clear. There are patchy calcifications of the carotid siphons. No hyperdense central vessels. ORBITS: The orbits are unremarkable. SINUSES: The sinuses are clear. SOFT TISSUES AND SKULL: No acute soft tissue abnormality. No skull fractures or focal skull lesions noted. Diffuse bilateral mastoid effusions. Both middle ear cavities remain clear. IMPRESSION: 1. No acute intracranial CT findings. Mild cerebral atrophy and small vessel changes. 2. Multiple subcentimeter parafalcine lipomas. 3. Diffuse bilateral mastoid effusions. Clear sinuses and middle ear cavities. Electronically signed by: Francis Quam MD 10/06/2024 11:02 PM EDT RP Workstation: [id number]   DG CHEST PORT 1 VIEW Result Date: 10/06/2024 CLINICAL DATA:  PICC placement EXAM: PORTABLE CHEST 1 VIEW COMPARISON:  10/06/2024 FINDINGS: Right PICC line placement with the tip in the SVC. New complete opacification of the left hemithorax. Interstitial prominence throughout the right lung. This may reflect interstitial edema. No acute bony abnormality. IMPRESSION: Right PICC line tip in the SVC. New complete opacification of the left hemithorax. Interstitial prominence in the right lung may reflect interstitial edema. Electronically Signed   By: Franky Crease M.D.   On: 10/06/2024 19:08      Assessment and Plan: 1. Elevated  Troponin 2. Coronary artery disease  She had a CT in 2023 with mixed calcific and non-calcific plaque but no  obstructive disease. She is currently admitted with sepsis, empyema and hypoxic respiratory failure. She is pending chest tube placement for worsening effusion despite thoracentesis x 2. She is encephalopathic. She is limited DNR (Do Not Resuscitate). Her Troponins are moderately elevated and flat. This is more consistent with demand ischemia or myocardial injury due to acute illness. This does not seem to be c/w ACS or Type 1 MI. She is not a candidate for invasive evaluation at this time. It is reasonable to get an echocardiogram, which is currently pending. Would not pursue other cardiac testing or intervention unless she recovers from her acute illness.  3. Sepsis Per primary service.    Risk Assessment/Risk Scores:    TIMI Risk Score for Unstable Angina or Non-ST Elevation MI:   The patient's TIMI risk score is 2, which indicates a 8% risk of all cause mortality, new or recurrent myocardial infarction or need for urgent revascularization in the next 14 days.    For questions or updates, please contact [facility] Please consult [email or web address] for contact info under      Signed, Glendia Ferrier, PA-C  10/10/2024 5:31 PM

## 2024-10-10 NOTE — Significant Event (Signed)
 Rapid Response Event Note   Reason for Call :  O2 desat into mid 80s on [place] and acute respiratory distress  Initial Focused Assessment:  Upon my arrival staff attempting to hold nebulizer mask for patient. She is restless and agitated.  She has increased work of breathing and is calling out for more air    Lung sounds diminished on Left, Coarse on the right.  BP 141/103  HR 120s  RR 30  O2 sat 97% on 15L [place]  Interventions:  PCXR done Labs drawn  Attempted Bipap, patient could not tolerate mask even after 0.5mg  Ativan IV  Notified Dr Kathrin and Lyle NP, CCM of patient change  Plan of Care:  Heated [place] as tolerated Consult IR for possible CT placement   Event Summary:   MD Notified:  Call Time: 1112 Arrival Time: 1115 End Time:  1245  Elvin [place], RN

## 2024-10-10 NOTE — Consult Note (Signed)
 NAME:  Christina Castaneda, MRN:  [id number], DOB:  10/08/1956, LOS: 4 ADMISSION DATE:  10/05/2024, CONSULTATION DATE:  10/10/24 REFERRING MD:  TRH, CHIEF COMPLAINT:  loculated pleural effusion   History of Present Illness:   28 yoF with PMH significant for chronic hypoxic respiratory failure on 3L [place], COPD, OSA (non-compliant), dysphagia, CKD II, HTN, anemia, obesity, hypothyroidism, HLD, gout, debility, and perforated duodenal ulcer 05/2024 s/p GDA embolization 06/25/24 with hospitalization c/b mucous plugging and left lower lobe atelectasis treated with HTS/ CPT.  Who presented to [facility] 10/13 from SNF/ [place] [facility] with confusion and fever despite being on 2 abx pta (unknown for what) admitted for septic shock secondary with AoC hypoxic respiratory failure, large left loculated pleural effusion, encephalopathy, AKI, and VRE UTI (vanc changed to zyvox 10/17 based off cultures). MRSA PCR neg, RVP neg.  Off pressors 10/15 on hydrocortisone taper.  Remains DNR/ DNI.  Underwent IR thoracentesis 10/15 with 200 ml amber fluid (neg cytology, LDH 1047, 97% neutrophils, GS neg, cx ngtd x 3 days) and again 10/17 given recurrent whiteout of left hemithorax with of dark yellow fluid, fluids not sent for testing.  CXR 10/17 showed residual moderate left pleural effusion and small 5-10% left apical pneumothorax, since weaned back to her baseline 3L.  Transferred to [facility] for further PCCM eval of presumed left sided empyema.   Pt remains encephalopathic.  Answers intermittent questions with short answers or nods.  Currently denies pain or trouble swallowing did not answer for SOB.  She does not recall prior thoracentesis.  Spoke with husband over the phone, states pt has been bed bound since 05/2024 after fall and left femur fx s/p femoral nailing.  She is normally alert and oriented, recently confused over last week.  He also relays he is caring for 2 other sick family members and not able to be at bedside much.    Pertinent  Medical History  chronic hypoxic respiratory failure on 3L [place], COPD, OSA, dysphagia, CKD II, HTN, anemia, obesity, hypothyroidism, HLD, gout,  and perforated duodenal ulcer 05/2024 s/p GDA embolization 06/25/24 with hospitalization c/b mucous plugging and left lower lobe atelectasis, perf sigmoid colon s/p hartmann and colostomy revision, Left femur fx s/p fall s/p repair 05/2024 - DNR/ DNI  Significant Hospital Events: Including procedures, antibiotic start and stop dates in addition to other pertinent events   10/13 [facility] 10/15 left IR thora 10/17 left IR thora 10/18 pulm consult  Interim History / Subjective:   Objective    Blood pressure 109/70, pulse 80, temperature 97.9 F (36.6 C), temperature source Axillary, resp. rate 19, height 5' 5 (1.651 m), weight 103.1 kg, last menstrual period 11/18/2012, SpO2 97%.        Intake/Output Summary (Last 24 hours) at 10/10/2024 0740 Last data filed at 10/10/2024 0600 Gross per 24 hour  Intake 500.98 ml  Output 650 ml  Net -149.02 ml   Filed Weights   10/06/24 0002 10/07/24 0600  Weight: 108 kg 103.1 kg   Examination: General:  chronically ill appearing obese older female lying in bed initially resting in NAD.  HEENT: MM pink/moist, edentulous, glasses Neuro: easily awakes, more anxious and tachypneic but non labored while talking to, confused - answers only to name and will answer intermittent questions, reverberates on trying to answer date, very limited spont movement but says dont touch her toes CV: rr, RUE PICC PULM:  mild tachypnea while awake but no accessory muscle use.  Faint diffuse exp wheeze, very  diminished breath sounds on left GI:  obese, colostomy with brown stool, purwick Extremities: warm/dry, generalized edema  Skin: no rashes, some scattered bruising to LUE   Resolved problem list  Septic shock AKI   Assessment and Plan   Left empyema Left apical PTX Chronic hypoxic respiratory failure   Dysphagia COPD OSA- untreated  IR thoracentesis 10/15 with 200 ml amber fluid (neg cytology, LDH 1047, 97% neutrophils, GS neg, cx ngtd x 3 days) and again 10/17 given recurrent whiteout of left hemithorax with of dark yellow fluid, fluids not sent for testing.   P:  - CT chest w/o contrast for further evaluation of left sided effusion and PTX and then anticipate CT placement and likely pleural lytics.  On heparin  VTE ppx only.  Husband would be agreeable to CT.  - cont abx per primary team- cefepime, flagyl , zyvox (VRE UTI) - follow 10/15 pleural cx, ngtd x 3 - duonebs TID and prn - PPI - aspiration precautions - aggressive pulm hygiene- IS, flutter/ CPT, mucinex   Remainder per primary team. PCCM to follow.   Labs   CBC: Recent Labs  Lab 10/05/24 2353 10/06/24 0447 10/07/24 0500 10/08/24 0443 10/09/24 0332  WBC 20.6* 24.2* 25.7* 13.6* 10.8*  NEUTROABS 19.0*  --   --   --   --   HGB 10.3* 9.4* 8.3* 8.0* 7.8*  HCT 34.0* 30.7* 26.8* 26.9* 25.7*  MCV 89.9 89.8 90.8 91.2 89.9  PLT 468* 445* 413* 288 263    Basic Metabolic Panel: Recent Labs  Lab 10/06/24 0447 10/07/24 0500 10/08/24 0443 10/09/24 0332 10/09/24 0333  NA 130* 129* 131* 134* 135  K 3.4* 4.0 3.5 3.3* 3.2*  CL 90* 93* 97* 97* 97*  CO2 28 29 29 29 29   GLUCOSE 117* 138* 88 86 87  BUN 22 23 19 19 20   CREATININE 1.21* 1.08* 0.77 0.78 0.78  CALCIUM  9.7 9.1 8.9 9.3 9.4  PHOS  --   --   --   --  3.0   GFR: Estimated Creatinine Clearance: 77.9 mL/min (by C-G formula based on SCr of 0.78 mg/dL). Recent Labs  Lab 10/05/24 2353 10/06/24 0038 10/06/24 0447 10/07/24 0500 10/08/24 0443 10/09/24 0332  WBC 20.6*  --  24.2* 25.7* 13.6* 10.8*  LATICACIDVEN 1.7 1.7  --   --   --   --     Liver Function Tests: Recent Labs  Lab 10/05/24 2353 10/07/24 0500 10/09/24 0333  AST 30 17  --   ALT 7 <5  --   ALKPHOS 96 86  --   BILITOT 0.3 0.3  --   PROT 7.1 5.7*  --   ALBUMIN  2.5* 2.1* 2.1*   No  results for input(s): LIPASE, AMYLASE in the last 168 hours. Recent Labs  Lab 10/06/24 1055  AMMONIA <13    ABG    Component Value Date/Time   PHART 7.57 (H) 10/06/2024 0000   PCO2ART 46 10/06/2024 0000   PO2ART 66 (L) 10/06/2024 0000   HCO3 41.3 (H) 10/06/2024 0000   TCO2 23 06/15/2024 2127   ACIDBASEDEF 3.0 (H) 06/15/2024 2127   O2SAT 95.6 10/06/2024 0000     Coagulation Profile: Recent Labs  Lab 10/05/24 2353  INR 1.2    Cardiac Enzymes: No results for input(s): CKTOTAL, CKMB, CKMBINDEX, TROPONINI in the last 168 hours.  HbA1C: No results found for: HGBA1C  CBG: Recent Labs  Lab 10/06/24 0431  GLUCAP 124*    Review of Systems:   Very  limited given pt encephalopathy   Past Medical History:  She,  has a past medical history of Asthma, Atypical endometrial hyperplasia, COPD (chronic obstructive pulmonary disease) (HCC), Goiter, Gout, Heart murmur, Hypertension, Hypothyroidism, Vitamin B12 deficiency, and Yeast infection.   Surgical History:   Past Surgical History:  Procedure Laterality Date   ABDOMINAL WALL DEFECT REPAIR N/A 04/18/2016   Procedure: CLOSURE OF ABDOMEN;  Surgeon: Debby Shipper, MD;  Location: [facility] OR;  Service: General;  Laterality: N/A;   APPENDECTOMY     APPLICATION OF WOUND VAC N/A 04/02/2016   Procedure: application of wound vac+;  Surgeon: Camellia Blush, MD;  Location[facility] OR;  Service: General;  Laterality: N/A;   CHOLECYSTECTOMY  2009   gallstone removed   COLOSTOMY N/A 03/30/2016   Procedure: COLOSTOMY;  Surgeon: Donnice Bury, MD;  Location: [facility] OR;  Service: General;  Laterality: N/A;   COLOSTOMY REVISION N/A 03/28/2016   Procedure: COLOSTOMY REVISION;  Surgeon: Lynwood Pina, MD;  Location: [facility] OR;  Service: General;  Laterality: N/A;   COLOSTOMY REVISION N/A 03/30/2016   Procedure: COLON RESECTION LEFT;  Surgeon: Donnice Bury, MD;  Location: [facility] OR;  Service: General;  Laterality: N/A;   FEMUR IM NAIL Left  06/12/2024   Procedure: INSERTION, INTRAMEDULLARY ROD, FEMUR, RETROGRADE;  Surgeon: Onesimo Oneil LABOR, MD;  Location: [facility] ORS;  Service: Orthopedics;  Laterality: Left;   IR ANGIOGRAM VISCERAL SELECTIVE  06/25/2024   IR ANGIOGRAM VISCERAL SELECTIVE  06/25/2024   IR ANGIOGRAM VISCERAL SELECTIVE  06/25/2024   IR EMBO ART  VEN HEMORR LYMPH EXTRAV  INC GUIDE ROADMAPPING  06/25/2024   IR US  GUIDE VASC ACCESS RIGHT  06/25/2024   LAPAROTOMY N/A 03/28/2016   Procedure: EXPLORATORY LAPAROTOMY;  Surgeon: Lynwood Pina, MD;  Location: [facility] OR;  Service: General;  Laterality: N/A;   LAPAROTOMY N/A 04/02/2016   Procedure: Re-exploration of open abdomen, application of abdominal wound vac;  Surgeon: Camellia Blush, MD;  Location: [facility] OR;  Service: General;  Laterality: N/A;   LAPAROTOMY N/A 04/04/2016   Procedure: EXPLORATORY LAPAROTOMY, PLACEMENT OF ABRA ABDOMINAL WALL CLOSURE SET ;  Surgeon: Camellia Blush, MD;  Location: [facility] OR;  Service: General;  Laterality: N/A;   LAPAROTOMY N/A 04/18/2016   Procedure: EXPLORATORY LAPAROTOMY;  Surgeon: Debby Shipper, MD;  Location: [facility] OR;  Service: General;  Laterality: N/A;   LAPAROTOMY N/A 06/15/2024   Procedure: LAPAROTOMY, EXPLORATORY OMENTAL GRAHAM PATCH REPAIR.;  Surgeon: Evonnie Dorothyann LABOR, DO;  Location: [facility]S;  Service: General;  Laterality: N/A;   LYSIS OF ADHESION N/A 06/15/2024   Procedure: LAPAROTOMY, FOR LYSIS OF ADHESIONS;  Surgeon: Evonnie Dorothyann LABOR, DO;  Location: [facility];  Service: General;  Laterality: N/A;   NECK SURGERY  1994   repair disk   OMENTECTOMY N/A 03/28/2016   Procedure: OMENTECTOMY;  Surgeon: Lynwood Pina, MD;  Location: [facility] OR;  Service: General;  Laterality: N/A;   ROBOTIC ASSISTED TOTAL HYSTERECTOMY WITH BILATERAL SALPINGO OOPHERECTOMY  11/18/2012   Procedure: ROBOTIC ASSISTED TOTAL HYSTERECTOMY WITH BILATERAL SALPINGO OOPHORECTOMY;  Surgeon: Elenore A. Dodie, MD;  Location: [facility] ORS;  Service: Gynecology;  Laterality: N/A;   TONSILLECTOMY      as a child   VACUUM ASSISTED CLOSURE CHANGE N/A 03/30/2016   Procedure: ABDOMINAL VAC CHANGE;  Surgeon: Donnice Bury, MD;  Location: [facility] OR;  Service: General;  Laterality: N/A;   WOUND DEBRIDEMENT N/A 03/28/2016   Procedure: DEBRIDEMENT WOUND;  Surgeon: Lynwood Pina, MD;  Location: [facility] OR;  Service: General;  Laterality: N/A;  Social History:   reports that she has been smoking cigarettes. She has a 42 pack-year smoking history. She has never used smokeless tobacco. She reports current alcohol use of about 1.0 - 3.0 standard drink of alcohol per week. She reports that she does not use drugs.   Family History:  Her family history includes COPD in her mother; Congestive Heart Failure in her mother; Diabetes in her father and mother; Heart attack in her father. There is no history of Colon cancer or Colon polyps.   Allergies Allergies  Allergen Reactions   Penicillins Anaphylaxis, Hives, Shortness Of Breath and Swelling    Pt given ceftriaxone  05/27/24 in ED and tolerated   Biphosphate Other (See Comments)    Aching joints   Fluticasone  Other (See Comments)    Headaches     Home Medications  Prior to Admission medications   Medication Sig Start Date End Date Taking? Authorizing Provider  acetaminophen  (TYLENOL ) 500 MG tablet Take 1,000 mg by mouth every 6 (six) hours as needed for mild pain (pain score 1-3).   Yes [provider]  albuterol  (VENTOLIN  HFA) 108 (90 Base) MCG/ACT inhaler Inhale 2 puffs into the lungs every 6 (six) hours as needed. Wheezing and shortness of breath Patient taking differently: Inhale 2 puffs into the lungs every 6 (six) hours as needed for wheezing or shortness of breath. 05/31/24 10/07/25 Yes Shahmehdi, Seyed A, MD  allopurinol  (ZYLOPRIM ) 300 MG tablet Take 300 mg by mouth daily.   Yes [provider]  ascorbic acid (VITAMIN C) 500 MG tablet Take 500 mg by mouth daily.   Yes [provider]  atorvastatin (LIPITOR) 20 MG tablet  Take 20 mg by mouth at bedtime.   Yes [provider]  Cholecalciferol  (VITAMIN D3) 1.25 MG (50000 UT) CAPS Take 1 capsule by mouth every Friday.   Yes [provider]  ciprofloxacin  (CIPRO ) 750 MG tablet Take 750 mg by mouth 2 (two) times daily. 09/28/24 10/12/24 Yes [provider]  Ergocalciferol  50 MCG (2000 UT) TABS Take 4,000 Units by mouth daily.   Yes [provider]  ferrous sulfate  325 (65 FE) MG tablet Take 325 mg by mouth 2 (two) times daily with a meal.   Yes [provider]  fluticasone -salmeterol (ADVAIR) 500-50 MCG/ACT AEPB Inhale 1 puff into the lungs in the morning and at bedtime. 05/31/24 10/06/25 Yes Shahmehdi, Adriana LABOR, MD  furosemide  (LASIX ) 20 MG tablet Take 20 mg by mouth daily.   Yes [provider]  gabapentin  (NEURONTIN ) 300 MG capsule Take 300 mg by mouth at bedtime. 02/27/21  Yes [provider]  ipratropium-albuterol  (DUONEB) 0.5-2.5 (3) MG/3ML SOLN Take 3 mLs by nebulization every 6 (six) hours as needed (shortness of breath, wheezing).   Yes [provider]  levothyroxine  (SYNTHROID ) 150 MCG tablet Take 150 mcg by mouth daily.   Yes [provider]  Multiple Vitamin (MULTIVITAMIN) tablet Take 1 tablet by mouth daily.   Yes [provider]  Nutritional Supplements (PROMOD) LIQD Take 30 mLs by mouth in the morning and at bedtime.   Yes [provider]  oxyCODONE  (OXY IR/ROXICODONE ) 5 MG immediate release tablet Take 5 mg by mouth every 4 (four) hours as needed for severe pain (pain score 7-10).   Yes [provider]  pantoprazole  (PROTONIX ) 40 MG tablet Take 1 tablet (40 mg total) by mouth 2 (two) times daily. 07/10/24  Yes Odell Celinda Balo, MD  sucralfate  (CARAFATE ) 1 g tablet Take 1  g by mouth 4 (four) times daily -  with meals and at bedtime.   Yes [provider]  sulfamethoxazole-trimethoprim (BACTRIM DS) 800-160 MG tablet Take 1 tablet by mouth 2 (two)  times daily. 09/28/24 10/12/24 Yes [provider]  vitamin B-12 (CYANOCOBALAMIN) 500 MCG tablet Take 500 mcg by mouth daily.   Yes [provider]  zinc gluconate 50 MG tablet Take 50 mg by mouth daily.   Yes [provider]     Critical care time: n/a     Lyle Pesa, NP [facility] 10/10/2024, 8:52 AM  See Amion for pager If no response to pager , please call [phone] until 7pm After 7:00 pm call Elink  [phone]

## 2024-10-10 NOTE — NC FL2 (Signed)
 [place]  MEDICAID FL2 LEVEL OF CARE FORM     IDENTIFICATION  Patient Name: Christina Castaneda Birthdate: 05/10/1956 Sex: female Admission Date (Current Location): 10/05/2024  [facility] and [place] Number:  [employer] and Address:  [facility],  [address], [place] [address]      Provider Number: [id number]  Attending Physician Name and Address:  Kathrin Mignon DASEN, MD  Relative Name and Phone Number:       Current Level of Care: Hospital Recommended Level of Care: Skilled Nursing Facility Prior Approval Number:    Date Approved/Denied:   PASRR Number: [id number]  Discharge Plan: SNF    Current Diagnoses: Patient Active Problem List   Diagnosis Date Noted   Pressure injury of skin 10/06/2024   Encephalopathy 10/06/2024   Arthritis of both knees 09/02/2024   Colostomy present (HCC) 09/02/2024   H/O: hysterectomy 09/02/2024   Hardening of the aorta (main artery of the heart) 09/02/2024   Hyperparathyroidism 09/02/2024   Neuropathy 09/02/2024   Osteopenia 09/02/2024   Primary gout 09/02/2024   Pulmonary nodule 09/02/2024   Stage 3 chronic kidney disease (HCC) 09/02/2024   Vitamin D  deficiency 09/02/2024   Vitamin B12 deficiency 09/02/2024   Systolic murmur 09/02/2024   Melena 07/01/2024   Intra-abdominal adhesions 06/16/2024   Hemorrhagic shock (HCC) 06/15/2024   Peptic ulcer perforation and hemorrhage (HCC) 06/15/2024   Leukocytosis 06/15/2024   Thrombocytopenia 06/15/2024   Duodenal ulcer 06/15/2024   Femur fracture, left (HCC) 06/07/2024   AKI (acute kidney injury) 05/27/2024   Hyperlipidemia 05/27/2024   Sepsis (HCC) 05/27/2024   Cigarette smoker 01/04/2023   Coronary artery disease 08/22/2022   Chronic venous insufficiency 02/21/2017   Lymphedema 02/21/2017   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   Adjustment disorder with mixed anxiety and depressed mood    Debility 05/02/2016   Edema    Generalized weakness    Chronic pain syndrome     Hypoalbuminemia due to protein-calorie malnutrition    OSA (obstructive sleep apnea)    Morbid obesity due to excess calories (HCC)    S/P colostomy (HCC)    Tracheostomy status (HCC)    Tracheostomy care (HCC)    COPD  GOLD 2/AB    Tobacco abuse    Essential hypertension    Bilateral knee pain    Dysphagia    ABLA (acute blood loss anemia)    Hyponatremia    Thyroid  activity decreased    Acute on chronic respiratory failure with hypoxia (HCC)    Pressure ulcer 04/01/2016   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   Hypothyroid 03/29/2016   Open wound of abdomen 03/29/2016   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   Anemia 03/13/2016   Perforated bowel (HCC) 03/13/2016   Septic shock (HCC) 03/13/2016   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   Kidney disease 09/06/2015   Nocturnal hypoxemia 09/06/2015   Complex endometrial hyperplasia 11/12/2012   Obesity 11/12/2012    Orientation RESPIRATION BLADDER Height & Weight     Self, Situation, Place  O2 (3L HFNC (on dreamstation as bipap was unavailble)) Incontinent Weight: 227 lb 4.7 oz (103.1 kg) Height:  5' 5 (165.1 cm)  BEHAVIORAL SYMPTOMS/MOOD NEUROLOGICAL BOWEL NUTRITION STATUS      Continent Diet (See D/C summary)  AMBULATORY STATUS COMMUNICATION OF NEEDS Skin   Extensive Assist Verbally Normal, Other (Comment) (Pressure injury sacrum mid stage 4)  Personal Care Assistance Level of Assistance  Bathing, Feeding, Dressing Bathing Assistance: Limited assistance Feeding assistance: Independent Dressing Assistance: Limited assistance     Functional Limitations Info  Sight, Hearing, Speech Sight Info: Impaired Hearing Info: Adequate Speech Info: Adequate    SPECIAL CARE FACTORS FREQUENCY  PT (By licensed PT), OT (By licensed OT)     PT Frequency: 5 times weekly OT Frequency: 5 times weekly            Contractures Contractures Info: Not present    Additional Factors Info  Code  Status, Allergies Code Status Info: FULL Allergies Info: Penicillins, Biphosphate, Fluticasone            Current Medications (10/10/2024):  This is the current hospital active medication list Current Facility-Administered Medications  Medication Dose Route Frequency Provider Last Rate Last Admin   ceFEPIme (MAXIPIME) 2 g in sodium chloride  0.9 % 100 mL IVPB  2 g Intravenous Q8H Emokpae, Courage, MD 200 mL/hr at 10/10/24 0553 2 g at 10/10/24 0553   Chlorhexidine  Gluconate Cloth 2 % PADS 6 each  6 each Topical Daily Opyd, Timothy S, MD   6 each at 10/09/24 0955   heparin  injection 5,000 Units  5,000 Units Subcutaneous Q8H Emokpae, Courage, MD   5,000 Units at 10/10/24 0554   hydrocortisone sodium succinate (SOLU-CORTEF) 100 MG injection 50 mg  50 mg Intravenous Q12H Gonfa, Taye T, MD       HYDROmorphone  (DILAUDID ) injection 0.5 mg  0.5 mg Intravenous Q3H PRN Cleotilde Laymon HERO, NP   0.5 mg at 10/09/24 2316   ipratropium-albuterol  (DUONEB) 0.5-2.5 (3) MG/3ML nebulizer solution 3 mL  3 mL Nebulization Q4H PRN Adefeso, Oladapo, DO   3 mL at 10/09/24 0531   levothyroxine  (SYNTHROID ) tablet 150 mcg  150 mcg Oral Q0600 Emokpae, Courage, MD       linezolid (ZYVOX) IVPB 600 mg  600 mg Intravenous Q12H Emokpae, Courage, MD   Stopped at 10/10/24 0058   LORazepam (ATIVAN) injection 0.5 mg  0.5 mg Intravenous Q12H PRN Emokpae, Courage, MD   0.5 mg at 10/09/24 1440   ondansetron  (ZOFRAN ) tablet 4 mg  4 mg Oral Q6H PRN Newton, Steven J, MD       Or   ondansetron  (ZOFRAN ) injection 4 mg  4 mg Intravenous Q6H PRN Newton, Steven J, MD       Oral care mouth rinse  15 mL Mouth Rinse PRN Eldonna Elspeth PARAS, MD       Oral care mouth rinse  15 mL Mouth Rinse PRN Emokpae, Courage, MD       oxyCODONE  (Oxy IR/ROXICODONE ) immediate release tablet 5 mg  5 mg Oral Q4H PRN Cleotilde Laymon HERO, NP   5 mg at 10/09/24 1359   pantoprazole  (PROTONIX ) injection 40 mg  40 mg Intravenous Q24H Eldonna Elspeth PARAS, MD   40 mg at 10/09/24  0951   sodium chloride  flush (NS) 0.9 % injection 10-40 mL  10-40 mL Intracatheter Q12H Johnson, Clanford L, MD   10 mL at 10/09/24 2316   sodium chloride  flush (NS) 0.9 % injection 10-40 mL  10-40 mL Intracatheter PRN Vicci Afton CROME, MD         Discharge Medications: Please see discharge summary for a list of discharge medications.  Relevant Imaging Results:  Relevant Lab Results:   Additional Information SSN: [id number]; on dreamstation as bipap was unavailable  Luise JAYSON Pan, LCSWA

## 2024-10-10 NOTE — Progress Notes (Signed)
   10/10/24 0805  Therapy Vitals  Pulse Rate 86  Resp (!) 22  MEWS Score/Color  MEWS Score 1  MEWS Score Color Green  Respiratory Assessment  Assessment Type Assess only  Respiratory Pattern Regular;Unlabored  Chest Assessment Chest expansion symmetrical  Oxygen Therapy/Pulse Ox  SpO2 98 %  O2 Device HFNC  O2 Therapy Oxygen humidified  O2 Flow Rate (L/min) (S)  3 L/min

## 2024-10-10 NOTE — Progress Notes (Signed)
 Patient was put in soft restraints for 1357. MD made aware. No order. Pulled off restraints.

## 2024-10-11 ENCOUNTER — Inpatient Hospital Stay (HOSPITAL_COMMUNITY)

## 2024-10-11 DIAGNOSIS — I509 Heart failure, unspecified: Secondary | ICD-10-CM

## 2024-10-11 DIAGNOSIS — J449 Chronic obstructive pulmonary disease, unspecified: Secondary | ICD-10-CM | POA: Diagnosis not present

## 2024-10-11 DIAGNOSIS — K631 Perforation of intestine (nontraumatic): Secondary | ICD-10-CM | POA: Diagnosis not present

## 2024-10-11 DIAGNOSIS — G4733 Obstructive sleep apnea (adult) (pediatric): Secondary | ICD-10-CM | POA: Diagnosis not present

## 2024-10-11 DIAGNOSIS — A419 Sepsis, unspecified organism: Secondary | ICD-10-CM | POA: Diagnosis not present

## 2024-10-11 DIAGNOSIS — R7989 Other specified abnormal findings of blood chemistry: Secondary | ICD-10-CM | POA: Diagnosis not present

## 2024-10-11 LAB — ECHOCARDIOGRAM COMPLETE
Area-P 1/2: 2.95 cm2
Calc EF: 52.8 %
Height: 65 in
S' Lateral: 2.5 cm
Single Plane A2C EF: 31.8 %
Single Plane A4C EF: 66.6 %
Weight: 3636.71 [oz_av]

## 2024-10-11 LAB — COMPREHENSIVE METABOLIC PANEL WITH GFR
ALT: 11 U/L (ref 0–44)
AST: 21 U/L (ref 15–41)
Albumin: 1.6 g/dL — ABNORMAL LOW (ref 3.5–5.0)
Alkaline Phosphatase: 58 U/L (ref 38–126)
Anion gap: 11 (ref 5–15)
BUN: 20 mg/dL (ref 8–23)
CO2: 27 mmol/L (ref 22–32)
Calcium: 9.4 mg/dL (ref 8.9–10.3)
Chloride: 96 mmol/L — ABNORMAL LOW (ref 98–111)
Creatinine, Ser: 0.99 mg/dL (ref 0.44–1.00)
GFR, Estimated: >60 mL/min (ref >60)
Glucose, Bld: 84 mg/dL (ref 70–99)
Potassium: 2.4 mmol/L — CL (ref 3.5–5.1)
Sodium: 134 mmol/L — ABNORMAL LOW (ref 135–145)
Total Bilirubin: 0.7 mg/dL (ref 0.0–1.2)
Total Protein: 6.1 g/dL — ABNORMAL LOW (ref 6.5–8.1)

## 2024-10-11 LAB — CBC
HCT: 29.4 % — ABNORMAL LOW (ref 36.0–46.0)
Hemoglobin: 9 g/dL — ABNORMAL LOW (ref 12.0–15.0)
MCH: 27.2 pg (ref 26.0–34.0)
MCHC: 30.6 g/dL (ref 30.0–36.0)
MCV: 88.8 fL (ref 80.0–100.0)
Platelets: 345 K/uL (ref 150–400)
RBC: 3.31 MIL/uL — ABNORMAL LOW (ref 3.87–5.11)
RDW: 15.4 % (ref 11.5–15.5)
WBC: 11.5 K/uL — ABNORMAL HIGH (ref 4.0–10.5)
nRBC: 0 % (ref 0.0–0.2)

## 2024-10-11 LAB — MAGNESIUM: Magnesium: 1.8 mg/dL (ref 1.7–2.4)

## 2024-10-11 LAB — CULTURE, BLOOD (ROUTINE X 2): Culture: NO GROWTH

## 2024-10-11 MED ORDER — POTASSIUM CHLORIDE 10 MEQ/100ML IV SOLN
10.0000 meq | INTRAVENOUS | Status: DC
  Administered 2024-10-11 (×2): 10 meq via INTRAVENOUS
  Filled 2024-10-11 (×2): qty 100

## 2024-10-11 MED ORDER — METHYLPREDNISOLONE SODIUM SUCC 40 MG IJ SOLR: 40.0000 mg | Freq: Every day | INTRAMUSCULAR | Status: DC

## 2024-10-11 MED ORDER — MEROPENEM 1 G IV SOLR
1.0000 g | Freq: Three times a day (TID) | INTRAVENOUS | Status: DC
  Filled 2024-10-11 (×2): qty 20

## 2024-10-11 MED ORDER — GLYCOPYRROLATE 0.2 MG/ML IJ SOLN
0.2000 mg | INTRAMUSCULAR | Status: DC | PRN
  Administered 2024-10-11: 0.2 mg via INTRAVENOUS
  Filled 2024-10-11: qty 1

## 2024-10-11 MED ORDER — ONDANSETRON HCL 4 MG/2ML IJ SOLN: 4.0000 mg | Freq: Four times a day (QID) | INTRAMUSCULAR | Status: DC | PRN

## 2024-10-11 MED ORDER — MORPHINE SULFATE (PF) 2 MG/ML IV SOLN: 1.0000 mg | INTRAVENOUS | Status: DC | PRN

## 2024-10-11 MED ORDER — GLYCOPYRROLATE 1 MG PO TABS: 1.0000 mg | ORAL_TABLET | ORAL | Status: DC | PRN

## 2024-10-11 MED ORDER — GLYCOPYRROLATE 0.2 MG/ML IJ SOLN: 0.2000 mg | INTRAMUSCULAR | Status: DC | PRN

## 2024-10-11 MED ORDER — LORAZEPAM 1 MG PO TABS: 1.0000 mg | ORAL_TABLET | ORAL | Status: DC | PRN

## 2024-10-11 MED ORDER — LORAZEPAM 2 MG/ML IJ SOLN
1.0000 mg | INTRAMUSCULAR | Status: DC | PRN
  Administered 2024-10-11 – 2024-10-12 (×3): 1 mg via INTRAVENOUS
  Filled 2024-10-11 (×3): qty 1

## 2024-10-11 MED ORDER — ONDANSETRON 4 MG PO TBDP: 4.0000 mg | ORAL_TABLET | Freq: Four times a day (QID) | ORAL | Status: DC | PRN

## 2024-10-11 MED ORDER — HALOPERIDOL LACTATE 2 MG/ML PO CONC: 0.5000 mg | ORAL | Status: DC | PRN

## 2024-10-11 MED ORDER — IOHEXOL 350 MG/ML SOLN
75.0000 mL | Freq: Once | INTRAVENOUS | Status: AC | PRN
  Administered 2024-10-11: 75 mL via INTRAVENOUS

## 2024-10-11 MED ORDER — PERFLUTREN LIPID MICROSPHERE
1.0000 mL | INTRAVENOUS | Status: AC | PRN
  Administered 2024-10-11: 2 mL via INTRAVENOUS

## 2024-10-11 MED ORDER — LORAZEPAM 2 MG/ML PO CONC: 1.0000 mg | ORAL | Status: DC | PRN

## 2024-10-11 MED ORDER — HALOPERIDOL 0.5 MG PO TABS
0.5000 mg | ORAL_TABLET | ORAL | Status: DC | PRN
Start: 2024-10-11 — End: 2024-10-15

## 2024-10-11 MED ORDER — POLYVINYL ALCOHOL 1.4 % OP SOLN: 1.0000 [drp] | Freq: Four times a day (QID) | OPHTHALMIC | Status: DC | PRN

## 2024-10-11 MED ORDER — MORPHINE SULFATE (PF) 2 MG/ML IV SOLN
1.0000 mg | INTRAVENOUS | Status: DC | PRN
  Administered 2024-10-11: 2 mg via INTRAVENOUS
  Administered 2024-10-11: 4 mg via INTRAVENOUS
  Administered 2024-10-11 – 2024-10-12 (×3): 2 mg via INTRAVENOUS
  Filled 2024-10-11: qty 2
  Filled 2024-10-11: qty 1
  Filled 2024-10-11: qty 2
  Filled 2024-10-11: qty 1

## 2024-10-11 MED ORDER — HALOPERIDOL LACTATE 5 MG/ML IJ SOLN: 0.5000 mg | INTRAMUSCULAR | Status: DC | PRN

## 2024-10-11 NOTE — Plan of Care (Signed)
  Problem: Education: Goal: Knowledge of General Education information will improve Description: Including pain rating scale, medication(s)/side effects and non-pharmacologic comfort measures Outcome: Progressing   Problem: Health Behavior/Discharge Planning: Goal: Ability to manage health-related needs will improve Outcome: Progressing   Problem: Clinical Measurements: Goal: Ability to maintain clinical measurements within normal limits will improve Outcome: Progressing Goal: Will remain free from infection Outcome: Progressing Goal: Diagnostic test results will improve Outcome: Progressing Goal: Respiratory complications will improve Outcome: Progressing Goal: Cardiovascular complication will be avoided Outcome: Progressing   Problem: Activity: Goal: Risk for activity intolerance will decrease Outcome: Progressing   Problem: Nutrition: Goal: Adequate nutrition will be maintained Outcome: Progressing   Problem: Coping: Goal: Level of anxiety will decrease Outcome: Progressing   Problem: Elimination: Goal: Will not experience complications related to bowel motility Outcome: Progressing Goal: Will not experience complications related to urinary retention Outcome: Progressing   Problem: Pain Managment: Goal: General experience of comfort will improve and/or be controlled Outcome: Progressing   Problem: Safety: Goal: Ability to remain free from injury will improve Outcome: Progressing   Problem: Skin Integrity: Goal: Risk for impaired skin integrity will decrease Outcome: Progressing   Problem: Fluid Volume: Goal: Hemodynamic stability will improve Outcome: Progressing   Problem: Clinical Measurements: Goal: Diagnostic test results will improve Outcome: Progressing Goal: Signs and symptoms of infection will decrease Outcome: Progressing   Problem: Respiratory: Goal: Ability to maintain adequate ventilation will improve Outcome: Progressing   Problem:  Education: Goal: Knowledge of disease or condition will improve Outcome: Progressing Goal: Knowledge of the prescribed therapeutic regimen will improve Outcome: Progressing Goal: Individualized Educational Video(s) Outcome: Progressing   Problem: Activity: Goal: Ability to tolerate increased activity will improve Outcome: Progressing Goal: Will verbalize the importance of balancing activity with adequate rest periods Outcome: Progressing   Problem: Respiratory: Goal: Ability to maintain a clear airway will improve Outcome: Progressing Goal: Levels of oxygenation will improve Outcome: Progressing Goal: Ability to maintain adequate ventilation will improve Outcome: Progressing   Problem: Education: Goal: Knowledge of ostomy care will improve Outcome: Progressing Goal: Understanding of discharge needs will improve Outcome: Progressing   Problem: Bowel/Gastric/Urinary: Goal: Gastrointestinal status for postoperative course will improve Outcome: Progressing   Problem: Coping: Goal: Coping ability will improve Outcome: Progressing   Problem: Fluid Volume: Goal: Ability to achieve a balanced intake and output will improve Outcome: Progressing   Problem: Health Behavior/Discharge Planning: Goal: Ability to manage health-related needs will improve Outcome: Progressing   Problem: Nutrition: Goal: Will attain and maintain optimal nutritional status will improve Outcome: Progressing   Problem: Clinical Measurements: Goal: Postoperative complications will be avoided or minimized Outcome: Progressing   Problem: Skin Integrity: Goal: Will show signs of wound healing Outcome: Progressing Goal: Risk for impaired skin integrity will decrease Outcome: Progressing

## 2024-10-11 NOTE — Consult Note (Deleted)
 NAME:  Christina Castaneda, MRN:  [id number], DOB:  27-Jul-1956, LOS: 5 ADMISSION DATE:  10/05/2024, CONSULTATION DATE:  10/10/24 REFERRING MD:  TRH, CHIEF COMPLAINT:  loculated pleural effusion   History of Present Illness:   26 yoF with PMH significant for chronic hypoxic respiratory failure on 3L [place], COPD, OSA (non-compliant), dysphagia, CKD II, HTN, anemia, obesity, hypothyroidism, HLD, gout, debility, and perforated duodenal ulcer 05/2024 s/p GDA embolization 06/25/24 with hospitalization c/b mucous plugging and left lower lobe atelectasis treated with HTS/ CPT.  Who presented to [facility] 10/13 from SNF/ [place] [facility] with confusion and fever despite being on 2 abx pta (unknown for what) admitted for septic shock secondary with AoC hypoxic respiratory failure, large left loculated pleural effusion, encephalopathy, AKI, and VRE UTI (vanc changed to zyvox 10/17 based off cultures). MRSA PCR neg, RVP neg.  Off pressors 10/15 on hydrocortisone taper.  Remains DNR/ DNI.  Underwent IR thoracentesis 10/15 with 200 ml amber fluid (neg cytology, LDH 1047, 97% neutrophils, GS neg, cx ngtd x 3 days) and again 10/17 given recurrent whiteout of left hemithorax with of dark yellow fluid, fluids not sent for testing.  CXR 10/17 showed residual moderate left pleural effusion and small 5-10% left apical pneumothorax, since weaned back to her baseline 3L.  Transferred to [facility] for further PCCM eval of presumed left sided empyema.   Pt remains encephalopathic.  Answers intermittent questions with short answers or nods.  Currently denies pain or trouble swallowing did not answer for SOB.  She does not recall prior thoracentesis.  Spoke with husband over the phone, states pt has been bed bound since 05/2024 after fall and left femur fx s/p femoral nailing.  She is normally alert and oriented, recently confused over last week.  He also relays he is caring for 2 other sick family members and not able to be at bedside much.    Pertinent  Medical History  chronic hypoxic respiratory failure on 3L [place], COPD, OSA, dysphagia, CKD II, HTN, anemia, obesity, hypothyroidism, HLD, gout,  and perforated duodenal ulcer 05/2024 s/p GDA embolization 06/25/24 with hospitalization c/b mucous plugging and left lower lobe atelectasis, perf sigmoid colon s/p hartmann and colostomy revision, Left femur fx s/p fall s/p repair 05/2024 - DNR/ DNI  Significant Hospital Events: Including procedures, antibiotic start and stop dates in addition to other pertinent events   10/13 [facility] 10/15 left IR thora 10/17 left IR thora 10/18 pulm consult  Interim History / Subjective:   On high flow nasal cannula for desaturation.  Objective    Blood pressure 106/85, pulse 96, temperature 98 F (36.7 C), temperature source Oral, resp. rate (!) 23, height 5' 5 (1.651 m), weight 103.1 kg, last menstrual period 11/18/2012, SpO2 100%.    FiO2 (%):  [65 %] 65 %   Intake/Output Summary (Last 24 hours) at 10/11/2024 1433 Last data filed at 10/11/2024 1404 Gross per 24 hour  Intake 1220.98 ml  Output 1150 ml  Net 70.98 ml   Filed Weights   10/06/24 0002 10/07/24 0600  Weight: 108 kg 103.1 kg   Examination: Chronically ill-appearing female Awake, delirious.  Not answering questions Mild tachypnea with scattered rhonchi.  Diminished breath sounds on the left base  Resolved problem list  Septic shock AKI   Assessment and Plan   Left empyema Left apical PTX Chronic hypoxic respiratory failure  Dysphagia COPD OSA- untreated  IR thoracentesis 10/15 with 200 ml amber fluid (neg cytology, LDH 1047, 97% neutrophils, GS neg,  cx ngtd x 3 days) and again 10/17 given recurrent whiteout of left hemithorax with of dark yellow fluid, fluids not sent for testing.   P:  Reviewed CT chest with still a sizable effusion on the left Discussed with IR for chest tube placement.  Recent pleural studies for culture and cytology Continue abx per primary  team- cefepime, flagyl , zyvox (VRE UTI) - duonebs TID and prn - PPI - aspiration precautions - aggressive pulm hygiene- IS, flutter/ CPT, mucinex   Remainder per primary team. PCCM to follow.  Signature:   Keyia Moretto MD [facility] See Amion for pager  If no response to pager , please call [phone] until 7pm After 7:00 pm call Elink  [phone] 10/11/2024, 2:34 PM

## 2024-10-11 NOTE — Progress Notes (Signed)
 PROGRESS NOTE  Christina Castaneda:[id number] DOB: May 12, 1956   PCP: [name], DO  Patient is from: SNF, [facility]  DOA: 10/05/2024 LOS: 5  Chief complaints Chief Complaint  Patient presents with   Altered Mental Status     Brief Narrative / Interim history: 68 year old F with PMH of COPD, OSA, hypoxic RF on 3 L, perforated sigmoid colon in the setting of diverticulitis s/p colostomy in 2017, duodenal ulcer perforation s/p ex lap and repair, prior tracheostomy, CKD-2 and morbid obesity admitted to [facility] from SNF facility on 10/06/2024 with severe sepsis septic shock due to empyema/loculated pleural effusion with acute hypoxic respiratory failure.   Patient was started on vasopressor, stress dose steroid, broad-spectrum antibiotics and admitted to ICU at [facility].  She underwent right thoracocentesis on 10/15 with removal of 200 cc exudative and culture negative amber-colored fluid She also underwent left thoracocentesis on 10/17 with removal of 350 cc clear of fluid.  No malignant cells in pleural fluid.  Patient was weaned off vasopressor but remained on stress dose steroid.  She has worsening respiratory distress and confusion.  After consultation with PCCM, she was transferred to [facility] for further evaluation.  Increased respiratory distress requiring up to 50 L by HFNC.  Also ongoing delirium and agitation requiring restraints.  Extensive discussion with patient's husband, Mr. Rieves over the phone on 10/11/2024.  Updated Mr. Smiley with patient's condition and overall poor prognosis.  We have discussed about treatment options including continuation of current treatment or full comfort care. Per Mr. Veno they have no children.  He stated that she has suffered a lot.  He says I do not want to lose her but I also do not want her to continue suffering.  I think she is ready.  Make her comfortable.  I have explained what comfort care entails including  use of medication such as IV morphine  sev alleviate pain, air hunger, anxiety and agitation.  He voiced understanding and appreciated the call.   Subjective: Seen and examined earlier this morning.  No major events overnight of this morning.  Patient is awake and oriented to self.  He thinks she is at [facility].  She seems upset and agitated.  Requiring bilateral wrist restraints.    Assessment and plan: End-of-life care/full comfort care: Initiated on 10/11/2024 after discussion with patient's husband as above - Comfort meds ordered. - Will reassess in the morning for disposition.  Severe Sepsis with septic shock due to empyema/loculated pleural effusion-POA -Right Thora on 10/15- 200 cc exudative and culture negative fluid.  Cytology negative for malignant cells. -Left Thora on 10/17 -350 cc.  Fluid was not sent for testing -Blood cultures with corynebacterium species in 1 out of 4 bottles. -Pleural fluid culture NGTD. -Urine culture with 30,000 colonies of VRE. -Leukocytosis leukocytosis improved. - Broad-spectrum antibiotics from 10/14-10/19 -Transferred to [facility] after consultation with PCCM on 10/17 -IR consulted by PCCM for left chest tube.  On 10/18 -Transitioned to full comfort care on 10/19  Acute on chronic respiratory failure with hypoxia: Requiring 50 L HFNC mainly due to WOB. Bilateral pleural effusion/left empyema COPD exacerbation/OSA -Transition to full comfort care.  Elevated troponin/type II MI: Troponin elevated to 946 and trended to 995.  EKG seems to show T wave inversions in lateral leads but poor quality.  BNP elevated to 791.  Unlikely ACS per cardiology.  Not a candidate for invasive intervention.  -Transition to full comfort care.  Acute  metabolic encephalopathy/hospital delirium: Currently awake and alert but confused, refusing care and agitated.  Requiring restraints.  Acute on chronic anemia: H&H stable after initial drop.  No overt  bleeding.  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 -Continue colostomy care  History of perforated duodenal ulcer and bleeding s/p repair and GDA embolization on 06/25/2024  Dysphagia: On dysphagia 3 diet from 06/2024 admission  - Comfort feeding  Hypothyroidism: TSH was 23 on 7/12.  VRE UTI?  Unclear if she had UTI symptoms.  Was on Zyvox.  Hyponatremia: Mild.  Improved.  Hypokalemia-full comfort care   AKI ruled out.  Has CKD-2.   Morbid obesity Body mass index is 37.82 kg/m.  Pressure skin injury: Present on admission Wound 10/06/24 0430 Pressure Injury Sacrum Mid Stage 4 - Full thickness tissue loss with exposed bone, tendon or muscle. (Active)   DVT prophylaxis:    Code Status: DNR-comfort. Family Communication: Updated patient's husband over the phone Level of care: Palliative Care Status is: Inpatient Remains inpatient appropriate because: End-of-life care   Final disposition: To be determined   65 minutes with more than 50% spent in reviewing records, counseling patient/family and coordinating care.  Consultants:  Pulmonology Interventional radiology Cardiology  Procedures: 10/15-left thoracocentesis 10/17-right thoracocentesis  Microbiology summarized: 10/14-full RVP nonreactive 10/14-blood culture with corynebacterium species in 1 out of 4 bottles likely contaminant 10/15-pleural fluid culture NGTD  Objective: Vitals:   10/11/24 0810 10/11/24 1000 10/11/24 1200 10/11/24 1400  BP: (!) 146/82 121/74 110/73 106/85  Pulse: (!) 117 92 93 96  Resp: (!) 25 (!) 26 (!) 23 (!) 23  Temp: 98 F (36.7 C)     TempSrc: Oral     SpO2: 100% 96% 98% 100%  Weight:      Height:        Examination:  GENERAL: No apparent distress.  Nontoxic. HEENT: MMM.  Vision and hearing grossly intact.  NECK: Supple.  No apparent JVD.  RESP: Notable work of breathing.  Diminished aeration bilaterally. CVS:  RRR. Heart sounds normal.   ABD/GI/GU: BS+. Abd soft, NTND.  Colostomy bag with normal-looking stool.  Scars from prior surgeries. MSK/EXT: Bilateral wrist restraints.  Minimally moves BLE difficult to assess fluid status NEURO: AA.  Oriented to self and hospital.  No facial asymmetry.  PSYCH: Appears agitated.  Sch Meds:  Scheduled Meds:   Continuous Infusions:   PRN Meds:.artificial tears, glycopyrrolate  **OR** glycopyrrolate  **OR** glycopyrrolate , haloperidol  **OR** haloperidol  **OR** haloperidol  lactate, LORazepam **OR** LORazepam **OR** LORazepam, morphine  injection, ondansetron  **OR** ondansetron  (ZOFRAN ) IV  Antimicrobials: Anti-infectives (From admission, onward)    Start     Dose/Rate Route Frequency Ordered Stop   10/11/24 1400  meropenem (MERREM) 1 g in sodium chloride  0.9 % 100 mL IVPB  Status:  Discontinued        1 g 200 mL/hr over 30 Minutes Intravenous Every 8 hours 10/11/24 0912 10/11/24 1447   10/10/24 0000  metroNIDAZOLE  (FLAGYL ) IVPB 500 mg        500 mg 100 mL/hr over 60 Minutes Intravenous Every 12 hours 10/09/24 1841 10/10/24 0014   10/09/24 2200  linezolid (ZYVOX) IVPB 600 mg  Status:  Discontinued        600 mg 300 mL/hr over 60 Minutes Intravenous Every 12 hours 10/09/24 1834 10/11/24 1447   10/09/24 2000  ceFEPIme (MAXIPIME) 2 g in sodium chloride  0.9 % 100 mL IVPB  Status:  Discontinued        2 g 200 mL/hr over 30  Minutes Intravenous Every 8 hours 10/09/24 1141 10/11/24 0826   10/06/24 1200  metroNIDAZOLE  (FLAGYL ) IVPB 500 mg  Status:  Discontinued        500 mg 100 mL/hr over 60 Minutes Intravenous Every 12 hours 10/06/24 0933 10/09/24 1841   10/06/24 1200  vancomycin  (VANCOCIN ) IVPB 1000 mg/200 mL premix  Status:  Discontinued        1,000 mg 200 mL/hr over 60 Minutes Intravenous Every 24 hours 10/06/24 1039 10/09/24 1834   10/06/24 1130  ceFEPIme (MAXIPIME) 2 g in sodium chloride  0.9 % 100 mL IVPB  Status:  Discontinued        2 g 200 mL/hr over 30 Minutes Intravenous  Every 12 hours 10/06/24 1039 10/09/24 1141   10/06/24 0145  aztreonam (AZACTAM) 2 g in sodium chloride  0.9 % 100 mL IVPB        2 g 200 mL/hr over 30 Minutes Intravenous  Once 10/06/24 0139 10/06/24 0215   10/06/24 0145  metroNIDAZOLE  (FLAGYL ) IVPB 500 mg        500 mg 100 mL/hr over 60 Minutes Intravenous  Once 10/06/24 0139 10/06/24 0252   10/06/24 0145  vancomycin  (VANCOCIN ) IVPB 1000 mg/200 mL premix        1,000 mg 200 mL/hr over 60 Minutes Intravenous  Once 10/06/24 0139 10/06/24 0251        I have personally reviewed the following labs and images: CBC: Recent Labs  Lab 10/05/24 2353 10/06/24 0447 10/07/24 0500 10/08/24 0443 10/09/24 0332 10/10/24 0920 10/11/24 0900  WBC 20.6*   < > 25.7* 13.6* 10.8* 9.1 11.5*  NEUTROABS 19.0*  --   --   --   --   --   --   HGB 10.3*   < > 8.3* 8.0* 7.8* 8.5* 9.0*  HCT 34.0*   < > 26.8* 26.9* 25.7* 28.0* 29.4*  MCV 89.9   < > 90.8 91.2 89.9 89.7 88.8  PLT 468*   < > 413* 288 263 331 345   < > = values in this interval not displayed.   BMP &GFR Recent Labs  Lab 10/08/24 0443 10/09/24 0332 10/09/24 0333 10/10/24 0920 10/11/24 0900  NA 131* 134* 135 133* 134*  K 3.5 3.3* 3.2* 3.1* 2.4*  CL 97* 97* 97* 96* 96*  CO2 29 29 29 27 27   GLUCOSE 88 86 87 91 84  BUN 19 19 20 19 20   CREATININE 0.77 0.78 0.78 0.93 0.99  CALCIUM  8.9 9.3 9.4 9.3 9.4  MG  --   --   --  1.6* 1.8  PHOS  --   --  3.0 2.7  --    Estimated Creatinine Clearance: 62.9 mL/min (by C-G formula based on SCr of 0.99 mg/dL). Liver & Pancreas: Recent Labs  Lab 10/05/24 2353 10/07/24 0500 10/09/24 0333 10/10/24 0920 10/11/24 0900  AST 30 17  --   --  21  ALT 7 <5  --   --  11  ALKPHOS 96 86  --   --  58  BILITOT 0.3 0.3  --   --  0.7  PROT 7.1 5.7*  --   --  6.1*  ALBUMIN  2.5* 2.1* 2.1* <1.5* 1.6*   No results for input(s): LIPASE, AMYLASE in the last 168 hours. Recent Labs  Lab 10/06/24 1055  AMMONIA <13   Diabetic: No results for input(s):  HGBA1C in the last 72 hours. Recent Labs  Lab 10/06/24 0431  GLUCAP 124*   Cardiac Enzymes:  No results for input(s): CKTOTAL, CKMB, CKMBINDEX, TROPONINI in the last 168 hours. No results for input(s): PROBNP in the last 8760 hours. Coagulation Profile: Recent Labs  Lab 10/05/24 2353  INR 1.2   Thyroid  Function Tests: No results for input(s): TSH, T4TOTAL, FREET4, T3FREE, THYROIDAB in the last 72 hours. Lipid Profile: No results for input(s): CHOL, HDL, LDLCALC, TRIG, CHOLHDL, LDLDIRECT in the last 72 hours. Anemia Panel: No results for input(s): VITAMINB12, FOLATE, FERRITIN, TIBC, IRON, RETICCTPCT in the last 72 hours. Urine analysis:    Component Value Date/Time   COLORURINE AMBER (A) 10/06/2024 2320   APPEARANCEUR CLOUDY (A) 10/06/2024 2320   LABSPEC 1.019 10/06/2024 2320   PHURINE 5.0 10/06/2024 2320   GLUCOSEU NEGATIVE 10/06/2024 2320   HGBUR MODERATE (A) 10/06/2024 2320   BILIRUBINUR NEGATIVE 10/06/2024 2320   KETONESUR NEGATIVE 10/06/2024 2320   PROTEINUR 100 (A) 10/06/2024 2320   NITRITE NEGATIVE 10/06/2024 2320   LEUKOCYTESUR LARGE (A) 10/06/2024 2320   Sepsis Labs: Invalid input(s): PROCALCITONIN, LACTICIDVEN  Microbiology: Recent Results (from the past 240 hours)  Blood Culture (routine x 2)     Status: Abnormal   Collection Time: 10/05/24 11:53 PM   Specimen: BLOOD  Result Value Ref Range Status   Specimen Description   Final    BLOOD BLOOD RIGHT FOREARM Performed at [facility], [address] [place], [place] [address]    Special Requests   Final    BOTTLES DRAWN AEROBIC AND ANAEROBIC Blood Culture adequate volume Performed at [facility], [address] [place], [place] [address]    Culture  Setup Time   Final    AEROBIC BOTTLE ONLY GRAM POSITIVE RODS Gram Stain Report Called to,Read Back By and Verified With: JAYSON AHLE RN [phone] MARLA CAROLIN Performed at [facility], [address],  [place], [place] [address]    Culture (A)  Final    CORYNEBACTERIUM SPECIES CORYNEBACTERIUM OTITIDIS Performed at [facility], [address], [place], [place] [address]    Report Status 10/10/2024 FINAL  Final  Blood Culture (routine x 2)     Status: None   Collection Time: 10/06/24 12:38 AM   Specimen: BLOOD  Result Value Ref Range Status   Specimen Description BLOOD LEFT ANTECUBITAL  Final   Special Requests   Final    BOTTLES DRAWN AEROBIC AND ANAEROBIC Blood Culture results may not be optimal due to an inadequate volume of blood received in culture bottles   Culture   Final    NO GROWTH 5 DAYS Performed at [facility], [address], [place], [place] [address]    Report Status 10/11/2024 FINAL  Final  MRSA Next Gen by PCR, Nasal     Status: None   Collection Time: 10/06/24  4:22 AM   Specimen: Nasal Mucosa; Nasal Swab  Result Value Ref Range Status   MRSA by PCR Next Gen NOT DETECTED NOT DETECTED Final    Comment: (NOTE) The GeneXpert MRSA Assay (FDA approved for NASAL specimens only), is one component of a comprehensive MRSA colonization surveillance program. It is not intended to diagnose MRSA infection nor to guide or monitor treatment for MRSA infections. Test performance is not FDA approved in patients less than 49 years old. Performed at [facility], [address], [place], [place] [address]   Respiratory (~20 pathogens) panel by PCR     Status: None   Collection Time: 10/06/24 10:56 AM   Specimen: Nasopharyngeal Swab; Respiratory  Result Value Ref Range  Status   Adenovirus NOT DETECTED NOT DETECTED Final   Coronavirus 229E NOT DETECTED NOT DETECTED Final    Comment: (NOTE) The Coronavirus on the Respiratory Panel, DOES NOT test for the novel  Coronavirus (2019 nCoV)    Coronavirus HKU1 NOT DETECTED NOT DETECTED Final   Coronavirus NL63 NOT DETECTED NOT DETECTED Final   Coronavirus OC43 NOT DETECTED NOT DETECTED Final   Metapneumovirus NOT DETECTED NOT  DETECTED Final   Rhinovirus / Enterovirus NOT DETECTED NOT DETECTED Final   Influenza A NOT DETECTED NOT DETECTED Final   Influenza B NOT DETECTED NOT DETECTED Final   Parainfluenza Virus 1 NOT DETECTED NOT DETECTED Final   Parainfluenza Virus 2 NOT DETECTED NOT DETECTED Final   Parainfluenza Virus 3 NOT DETECTED NOT DETECTED Final   Parainfluenza Virus 4 NOT DETECTED NOT DETECTED Final   Respiratory Syncytial Virus NOT DETECTED NOT DETECTED Final   Bordetella pertussis NOT DETECTED NOT DETECTED Final   Bordetella Parapertussis NOT DETECTED NOT DETECTED Final   Chlamydophila pneumoniae NOT DETECTED NOT DETECTED Final   Mycoplasma pneumoniae NOT DETECTED NOT DETECTED Final    Comment: Performed at [facility], [address] [place], [place] [address]  Urine Culture     Status: Abnormal   Collection Time: 10/06/24 11:20 PM   Specimen: Urine, Random  Result Value Ref Range Status   Specimen Description   Final    URINE, RANDOM Performed at [facility], [address], [place], [place] [address]    Special Requests   Final    NONE Reflexed from [id number] Performed at [facility], [address] [place], [place] [address]    Culture (A)  Final    30,000 COLONIES/mL ENTEROCOCCUS FAECALIS VANCOMYCIN  RESISTANT ENTEROCOCCUS    Report Status 10/09/2024 FINAL  Final   Organism ID, Bacteria ENTEROCOCCUS FAECALIS (A)  Final      Susceptibility   Enterococcus faecalis - MIC*    AMPICILLIN <=2 SENSITIVE Sensitive     NITROFURANTOIN <=16 SENSITIVE Sensitive     VANCOMYCIN  >=32 RESISTANT Resistant     * 30,000 COLONIES/mL ENTEROCOCCUS FAECALIS  Gram stain     Status: None   Collection Time: 10/07/24 12:45 PM   Specimen: Pleura  Result Value Ref Range Status   Specimen Description PLEURAL  Final   Special Requests PLEURAL  Final   Gram Stain   Final    NO ORGANISMS SEEN WBC PRESENT, PREDOMINANTLY PMN Performed at [facility], [address] [place], [place] [address]    Report  Status 10/07/2024 FINAL  Final  Culture, body fluid w Gram Stain-bottle     Status: None (Preliminary result)   Collection Time: 10/07/24 12:45 PM   Specimen: Pleura  Result Value Ref Range Status   Specimen Description PLEURAL LEFT  Final   Special Requests   Final    BOTTLES DRAWN AEROBIC AND ANAEROBIC Blood Culture adequate volume   Culture   Final    NO GROWTH 4 DAYS Performed at [facility], [address], [place], [place] [address]    Report Status PENDING  Incomplete    Radiology Studies: No results found.     Tasheba Henson T. Roselind Klus [facility]  Hospitalist  If 7PM-7AM, please contact night-coverage [email or web address] 10/11/2024, 2:57 PM

## 2024-10-11 NOTE — Progress Notes (Signed)
 PT Cancellation Note  Patient Details Name: Christina Castaneda MRN: [id number] DOB: 11-04-1956   Cancelled Treatment:    Reason Eval/Treat Not Completed: Medical issues which prohibited therapy (Currently pt on HHFNC. Pt respiratory status is tenuous and possibly waiting on procedure for chest tube per RN. RN asking to hold today. Will follow up as able and appropriate.)  Christina Castaneda, DPT, CLT  Acute Rehabilitation Services Office: [phone] (Secure chat preferred)   Christina Castaneda 10/11/2024, 12:17 PM

## 2024-10-11 NOTE — Progress Notes (Signed)
 Pharmacy Antibiotic Note  Christina Castaneda is a 68 y.o. female admitted on 10/05/2024 with HCAP and empyema.  Patient has been treated with cefepime, but there is concern for encephalopathy. Pharmacy has been consulted for meropenem dosing.  Plan: -Start Meropenem 1 g IV Q8H   -Monitor patient renal function and clinical status  Height: 5' 5 (165.1 cm) Weight: 103.1 kg (227 lb 4.7 oz) IBW/kg (Calculated) : 57  Temp (24hrs), Avg:98 F (36.7 C), Min:97.8 F (36.6 C), Max:98.2 F (36.8 C)  Recent Labs  Lab 10/05/24 2353 10/06/24 0038 10/06/24 0447 10/07/24 0500 10/08/24 0443 10/09/24 0332 10/09/24 0333 10/10/24 0920  WBC 20.6*  --  24.2* 25.7* 13.6* 10.8*  --  9.1  CREATININE 1.01*  --  1.21* 1.08* 0.77 0.78 0.78 0.93  LATICACIDVEN 1.7 1.7  --   --   --   --   --   --     Estimated Creatinine Clearance: 67 mL/min (by C-G formula based on SCr of 0.93 mg/dL).    Allergies  Allergen Reactions   Penicillins Anaphylaxis, Hives, Shortness Of Breath and Swelling    Pt given ceftriaxone  05/27/24 in ED and tolerated   Biphosphate Other (See Comments)    Aching joints   Dakin's [Sodium Hypochlorite] Other (See Comments)    Burns the skin   Fluticasone  Other (See Comments)    Headaches    Antimicrobials this admission: Cefepime 10/14 >> 10/19 Aztreonam 10/14 >> 10/14 Metronidazole  10/14>>10/18 Vancomycin  10/14>>10/17 Linezolid 10/17>> Meropenem 10/19>>  Microbiology results: 10/14 MRSA PCR: negative 10/14 Respiratory Panel: negative 10/15 Gram stain: negative 10/14 Urine culture: 30k VRE E faecalis 10/13 Bcx: Corynebacterium otitidis 10/14 Bcx: no growth 5 days 10/15 body fluid culture: no growth 4 days  Thank you for allowing pharmacy to be involved with this patient's care.  Mendel Barter, PharmD PGY1 Clinical Pharmacist [facility]  10/11/2024 9:10 AM

## 2024-10-11 NOTE — Progress Notes (Signed)
 NAME:  Christina Castaneda, MRN:  [id number], DOB:  [birth year], LOS: 5 ADMISSION DATE:  10/05/2024, CONSULTATION DATE:  10/10/24 REFERRING MD:  TRH, CHIEF COMPLAINT:  loculated pleural effusion   History of Present Illness:   26 yoF with PMH significant for chronic hypoxic respiratory failure on 3L [place], COPD, OSA (non-compliant), dysphagia, CKD II, HTN, anemia, obesity, hypothyroidism, HLD, gout, debility, and perforated duodenal ulcer 05/2024 s/p GDA embolization 06/25/24 with hospitalization c/b mucous plugging and left lower lobe atelectasis treated with HTS/ CPT.  Who presented to [facility] 10/13 from SNF/ [place] center with confusion and fever despite being on 2 abx pta (unknown for what) admitted for septic shock secondary with AoC hypoxic respiratory failure, large left loculated pleural effusion, encephalopathy, AKI, and VRE UTI (vanc changed to zyvox 10/17 based off cultures). MRSA PCR neg, RVP neg.  Off pressors 10/15 on hydrocortisone taper.  Remains DNR/ DNI.  Underwent IR thoracentesis 10/15 with 200 ml amber fluid (neg cytology, LDH 1047, 97% neutrophils, GS neg, cx ngtd x 3 days) and again 10/17 given recurrent whiteout of left hemithorax with of dark yellow fluid, fluids not sent for testing.  CXR 10/17 showed residual moderate left pleural effusion and small 5-10% left apical pneumothorax, since weaned back to her baseline 3L.  Transferred to [facility] for further PCCM eval of presumed left sided empyema.   Pt remains encephalopathic.  Answers intermittent questions with short answers or nods.  Currently denies pain or trouble swallowing did not answer for SOB.  She does not recall prior thoracentesis.  Spoke with husband over the phone, states pt has been bed bound since 05/2024 after fall and left femur fx s/p femoral nailing.  She is normally alert and oriented, recently confused over last week.  He also relays he is caring for 2 other sick family members and not able to be at bedside much.    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  Objective    Blood pressure 106/85, pulse 96, temperature 98 F (36.7 C), temperature source Oral, resp. rate (!) 23, height 5' 5 (1.651 m), weight 103.1 kg, last menstrual period 11/18/2012, SpO2 100%.    FiO2 (%):  [65 %] 65 %   Intake/Output Summary (Last 24 hours) at 10/11/2024 1439 Last data filed at 10/11/2024 1404 Gross per 24 hour  Intake 1220.98 ml  Output 1150 ml  Net 70.98 ml   Filed Weights   10/06/24 0002 10/07/24 0600  Weight: 108 kg 103.1 kg   Examination: Chronically ill-appearing female Awake, delirious.  Not answering questions Mild tachypnea with scattered rhonchi.  Diminished breath sounds on the left base  Resolved problem list  Septic shock AKI   Assessment and Plan   Left empyema Left apical PTX Chronic hypoxic respiratory failure  Dysphagia COPD OSA- untreated  IR thoracentesis 10/15 with 200 ml amber fluid (neg cytology, LDH 1047, 97% neutrophils, GS neg,  cx ngtd x 3 days) and again 10/17 given recurrent whiteout of left hemithorax with of dark yellow fluid, fluids not sent for testing.   P:  Reviewed CT chest with still a sizable effusion on the left Discussed with IR for chest tube placement.  Repeat pleural studies for culture and cytology Continue abx per primary  team- cefepime, flagyl , zyvox (VRE UTI) - duonebs TID and prn - PPI - aspiration precautions - aggressive pulm hygiene- IS, flutter/ CPT, mucinex   Remainder per primary team. PCCM to follow.  Signature:   Hence Derrick MD [facility] See Amion for pager  If no response to pager , please call [phone] until 7pm After 7:00 pm call Elink  [phone] 10/11/2024, 2:39 PM

## 2024-10-11 NOTE — Progress Notes (Signed)
 Patient moved to comfort care chest pt discontinued. BiPAP and HHFNC removed per RN order for comfort care only.

## 2024-10-11 NOTE — Progress Notes (Signed)
 Rounding Note   Patient Name: Christina Castaneda Date of Encounter: 10/11/2024  [facility] Cardiologist: None   Subjective - No acute events overnight - Patient encephalopathic  Scheduled Meds:  albuterol   2.5 mg Nebulization Q4H   arformoterol   15 mcg Nebulization BID   budesonide  (PULMICORT ) nebulizer solution  0.5 mg Nebulization BID   Chlorhexidine  Gluconate Cloth  6 each Topical Daily   heparin  injection (subcutaneous)  5,000 Units Subcutaneous Q8H   levothyroxine   150 mcg Oral Q0600   methylPREDNISolone (SOLU-MEDROL) injection  80 mg Intravenous Daily   pantoprazole  (PROTONIX ) IV  40 mg Intravenous Q24H   revefenacin  175 mcg Nebulization Daily   sodium chloride  flush  10-40 mL Intracatheter Q12H   sodium chloride  HYPERTONIC  4 mL Nebulization BID   Continuous Infusions:  linezolid (ZYVOX) IV Stopped (10/11/24 1134)   meropenem (MERREM) IV     PRN Meds: HYDROmorphone  (DILAUDID ) injection, LORazepam, ondansetron  **OR** ondansetron  (ZOFRAN ) IV, mouth rinse, mouth rinse, oxyCODONE , sodium chloride  flush   Vital Signs  Vitals:   10/11/24 0500 10/11/24 0810 10/11/24 1000 10/11/24 1200  BP: 110/85 (!) 146/82 121/74 110/73  Pulse: (!) 107 (!) 117 92 93  Resp: (!) 21 (!) 25 (!) 26 (!) 23  Temp: 97.8 F (36.6 C) 98 F (36.7 C)    TempSrc: Axillary Oral    SpO2: 100% 100% 96% 98%  Weight:      Height:        Intake/Output Summary (Last 24 hours) at 10/11/2024 1235 Last data filed at 10/11/2024 1145 Gross per 24 hour  Intake 1200 ml  Output 1150 ml  Net 50 ml      10/07/2024    6:00 AM 10/06/2024   12:02 AM 07/29/2024    1:10 PM  Last 3 Weights  Weight (lbs) 227 lb 4.7 oz 238 lb 1.6 oz 238 lb  Weight (kg) 103.1 kg 108 kg 107.956 kg      Telemetry Atrial fibrillation, PVCs- Personally Reviewed  ECG  No new ECG  Physical Exam  GEN: Chronically ill-appearing now on Optiflow Neck: Unable to assess JVD due to body habitus Cardiac: Irregularly  irregular rhythm with normal rate, no murmurs, rubs, or gallops.  Respiratory: Clear to auscultation bilaterally on anterior auscultation GI: Soft, nontender, non-distended  MS: Fuhs anasarca Neuro:  Nonfocal  Psych: Normal affect   Labs High Sensitivity Troponin:   Recent Labs  Lab 10/10/24 1210 10/10/24 1323  TROPONINIHS 946* 995*     Chemistry Recent Labs  Lab 10/05/24 2353 10/06/24 0447 10/07/24 0500 10/08/24 0443 10/09/24 0333 10/10/24 0920 10/11/24 0900  NA 130*   < > 129*   < > 135 133* 134*  K 3.8   < > 4.0   < > 3.2* 3.1* 2.4*  CL 89*   < > 93*   < > 97* 96* 96*  CO2 32   < > 29   < > 29 27 27   GLUCOSE 106*   < > 138*   < > 87 91 84  BUN 20   < > 23   < > 20 19 20   CREATININE 1.01*   < > 1.08*   < > 0.78 0.93 0.99  CALCIUM  10.2   < > 9.1   < > 9.4 9.3 9.4  MG  --   --   --   --   --  1.6* 1.8  PROT 7.1  --  5.7*  --   --   --  6.1*  ALBUMIN  2.5*  --  2.1*  --  2.1* <1.5* 1.6*  AST 30  --  17  --   --   --  21  ALT 7  --  <5  --   --   --  11  ALKPHOS 96  --  86  --   --   --  58  BILITOT 0.3  --  0.3  --   --   --  0.7  GFRNONAA 60*   < > 55*   < > >60 >60 >60  ANIONGAP 8   < > 7   < > 9 10 11    < > = values in this interval not displayed.    Lipids No results for input(s): CHOL, TRIG, HDL, LABVLDL, LDLCALC, CHOLHDL in the last 168 hours.  Hematology Recent Labs  Lab 10/09/24 0332 10/10/24 0920 10/11/24 0900  WBC 10.8* 9.1 11.5*  RBC 2.86* 3.12* 3.31*  HGB 7.8* 8.5* 9.0*  HCT 25.7* 28.0* 29.4*  MCV 89.9 89.7 88.8  MCH 27.3 27.2 27.2  MCHC 30.4 30.4 30.6  RDW 15.3 15.6* 15.4  PLT 263 331 345   Thyroid  No results for input(s): TSH, FREET4 in the last 168 hours.  BNP Recent Labs  Lab 10/10/24 1210  BNP 791.0*    DDimer  Recent Labs  Lab 10/06/24 1055  DDIMER >20.00*     Radiology  DG CHEST PORT 1 VIEW Result Date: 10/10/2024 CLINICAL DATA:  Respiratory distress. EXAM: PORTABLE CHEST 1 VIEW COMPARISON:  10/09/2024  FINDINGS: Right-sided PICC line with tip over the SVC unchanged. Lungs are adequately inflated and demonstrate stable to slight worsening of a large left pleural effusion with associated atelectasis. Right lung essentially clear. Cardiomediastinal silhouette and remainder the exam is unchanged. IMPRESSION: Stable to slight worsening of a large left pleural effusion with associated atelectasis. Electronically Signed   By: Toribio Agreste M.D.   On: 10/10/2024 11:48   DG Chest Port 1 View Result Date: 10/09/2024 CLINICAL DATA:  Follow-up left pleural effusion. Recent thoracentesis. EXAM: PORTABLE CHEST 1 VIEW COMPARISON:  10/09/2024 FINDINGS: Moderate left pleural effusion is seen but significantly decreased in size since previous study. A small 5-10% left apical pneumothorax is seen. Cardiomegaly remains stable. Diffuse interstitial infiltrates are again seen, consistent with interstitial edema. IMPRESSION: Significantly decreased size of moderate left pleural effusion since previous study. Small 5-10% left apical pneumothorax. Stable cardiomegaly and diffuse interstitial edema. Electronically Signed   By: Norleen DELENA Kil M.D.   On: 10/09/2024 17:59   US  THORACENTESIS ASP PLEURAL SPACE W/IMG GUIDE Result Date: 10/09/2024 INDICATION: Patient movement with altered mental status, sepsis found to have loculated left pleural effusion status post 200 mL thoracentesis on 10/07/24. Request for therapeutic left thoracentesis EXAM: ULTRASOUND GUIDED LEFT THORACENTESIS MEDICATIONS: 5 mL 1% lidocaine  COMPLICATIONS: None immediate. PROCEDURE: An ultrasound guided thoracentesis was thoroughly discussed with the patient and questions answered. The benefits, risks, alternatives and complications were also discussed. The patient understands and wishes to proceed with the procedure. Written consent was obtained. Ultrasound was performed to localize and mark an adequate pocket of fluid in the left chest. The area was then prepped  and draped in the normal sterile fashion. 1% Lidocaine  was used for local anesthesia. Under ultrasound guidance a 6 Fr Safe-T-Centesis catheter was introduced. Thoracentesis was performed. The catheter was removed and a dressing applied. FINDINGS: A total of approximately 350 mL of clear, dark yellow fluid was removed. IMPRESSION: Successful ultrasound guided left  thoracentesis yielding 350 mL of pleural fluid. Performed by Clotilda Hesselbach, PA-C Electronically Signed   By: Ester Sides M.D.   On: 10/09/2024 16:14    Cardiac Studies   CCTA 08/22/22:  IMPRESSION: 1.  3 vessel calcium  score 194 which is 84 th percentile for age/sex   2.  Normal diameter ascending thoracic aorta 3.5 cm   3.  CAD RADS 1 non obstructive CAD see description above   4.  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 Gulotta is a 68 y/o female with a PMH of nonobstructive CAD, HTN, HLD, COPD with chronic hypoxic respiratory failure, OSA, CKD, and ruptured PUD and was admitted for septic shock in the setting of empyema. Cardiology consulted for elevated troponins.   Assessment & Plan    #Elevated Troponins :: Unfortunately patient is completely encephalopathic and unable to participate in ROS questioning.  No evidence of ischemia at this time.  Pending complete echocardiogram.  If her echocardiogram is not suggestive of an ischemic insult, then cardiology will sign off. - Follow-up echocardiogram       For questions or updates, please contact [facility] Please consult [email or web address] for contact info under       Signed, Georganna Archer, MD  10/11/2024, 12:35 PM

## 2024-10-12 DIAGNOSIS — Z515 Encounter for palliative care: Secondary | ICD-10-CM | POA: Diagnosis not present

## 2024-10-12 DIAGNOSIS — R0600 Dyspnea, unspecified: Secondary | ICD-10-CM | POA: Diagnosis not present

## 2024-10-12 DIAGNOSIS — Z7189 Other specified counseling: Secondary | ICD-10-CM | POA: Diagnosis not present

## 2024-10-12 LAB — CULTURE, BODY FLUID W GRAM STAIN -BOTTLE
Culture: NO GROWTH
Special Requests: ADEQUATE

## 2024-10-12 MED ORDER — LORAZEPAM 2 MG/ML IJ SOLN
1.0000 mg | INTRAMUSCULAR | Status: DC
  Administered 2024-10-12 – 2024-10-14 (×12): 2 mg via INTRAVENOUS
  Filled 2024-10-12 (×12): qty 1

## 2024-10-12 MED ORDER — MORPHINE SULFATE (PF) 2 MG/ML IV SOLN
2.0000 mg | INTRAVENOUS | Status: DC
  Administered 2024-10-12 – 2024-10-14 (×11): 2 mg via INTRAVENOUS
  Filled 2024-10-12 (×12): qty 1

## 2024-10-12 MED ORDER — LORAZEPAM 2 MG/ML IJ SOLN: 1.0000 mg | INTRAMUSCULAR | Status: DC | PRN

## 2024-10-12 MED ORDER — MORPHINE SULFATE (PF) 2 MG/ML IV SOLN
2.0000 mg | INTRAVENOUS | Status: DC | PRN
  Administered 2024-10-13 – 2024-10-14 (×3): 2 mg via INTRAVENOUS
  Filled 2024-10-12 (×2): qty 1

## 2024-10-12 NOTE — Progress Notes (Signed)
 Spoke with patient's husband to update him on patient's status. I held the phone up to the patient's ear so he could speak with her. Right now the patient is comfortable. Scheduled doses of morphine  and ativan recently administered.

## 2024-10-12 NOTE — Progress Notes (Signed)
 Palliative:  HPI: 68 y.o. female  with past medical history of COPD, asthma, atypical endometrial hyperplasia, goiter, gout, hypertension, hypothyroidism, and vitamin B12 deficiency, prolonged and complicated admission from 06/07/2024 through 07/10/2024 after she sustained a fall with left femur fracture admitted on 10/05/2024 with fever and confusion. Ongoing decline over course of hospitalization with respiratory failure with left empyema, COPD, and OSA. Decision made for comfort care 10/19.   I reviewed records and noted transition to comfort care. I agree with comfort care. I met with Veleda and no family at bedside. Tomicka is confused but alert. She is unable to communicate her needs. She is not agitated but does appear tense with ongoing dyspnea although much improved according to RN at bedside.   I called and discussed with husband. He confirms desire for comfort care and agrees with medications to be titrated as needed to ensure comfort. He is accepting that she is at end of life and although this is difficult for him he ultimately does not want her to suffer. He has committed to looking after his disabled nephew while his sister is in rehab after surgery. He is sad that he cannot be here with her but he knows that she would want him to follow through with his commitment and care for his nephew as planned. I assured him that we will continue to take good care of her and keep her comfortable. He is appreciative of the care she has received.   All questions/concerns addressed. Emotional support provided.   Exam: Awake, confused, unable to focus. Tense. Makes brief eye contact. Breathing labored, tachypnea. Abd soft. Warm to touch. BLE edema.  Plan: - DNR - Full comfort care - Medications adjusted to better manage symptoms - Anticipate hospital death  50 min   Bernarda Kitty, NP Palliative Medicine Team Pager [phone] (Please see [email or web address] for schedule) Team Phone [phone]

## 2024-10-12 NOTE — Progress Notes (Signed)
 PCCM note  Patient transition to comfort care yesterday.  We will sign off.  Please call back with any questions  Lonna Coder MD [facility] 10/12/2024, 10:59 AM

## 2024-10-12 NOTE — Progress Notes (Signed)
 PROGRESS NOTE  Christina Castaneda FMW:[id number] DOB: [birth year]   PCP: [name], DO  Patient is from: SNF, [facility]  DOA: 10/05/2024 LOS: 6  Chief complaints Chief Complaint  Patient presents with   Altered Mental Status     Brief Narrative / Interim history: 68 year old F with PMH of COPD, OSA, hypoxic RF on 3 L, perforated sigmoid colon in the setting of diverticulitis s/p colostomy in 2017, duodenal ulcer perforation s/p ex lap and repair, prior tracheostomy, CKD-2 and morbid obesity admitted to [facility] from SNF facility on 10/06/2024 with severe sepsis septic shock due to empyema/loculated pleural effusion with acute hypoxic respiratory failure.   Patient was started on vasopressor, stress dose steroid, broad-spectrum antibiotics and admitted to ICU at [facility]  She underwent right thoracocentesis on 10/15 with removal of 200 cc exudative and culture negative amber-colored fluid She also underwent left thoracocentesis on 10/17 with removal of 350 cc clear of fluid.  No malignant cells in pleural fluid.  Patient was weaned off vasopressor but remained on stress dose steroid.  She has worsening respiratory distress and confusion.  After consultation with PCCM, she was transferred to [facility] for further evaluation.  Increased respiratory distress requiring up to 50 L by HFNC.  Also ongoing delirium and agitation requiring restraints.  Extensive discussion with patient's husband, Mr. Freshour over the phone on 10/11/2024.  Updated Mr. Smiley with patient's condition and overall poor prognosis.  We have discussed about treatment options including continuation of current treatment or full comfort care. Per Mr. Crounse they have no children.  He stated that she has suffered a lot.  He says I do not want to lose her but I also do not want her to continue suffering.  I think she is ready.  Make her comfortable.  I have explained what comfort care entails including  use of medication such as IV morphine  sev alleviate pain, air hunger, anxiety and agitation.  He voiced understanding and appreciated the call.    Patient transition to full comfort care on 10/19.  Anticipate in-hospital death.   Subjective: Seen and examined earlier this morning.  No major events overnight or this morning.  Somewhat sleepy but wakes to voice.  She has work of breathing and rattling sounds.  Last dose of morphine  was at 6 PM.  Requested RN to be generous with comfort meds to achieve optimal comfort.   Assessment and plan: End-of-life care/full comfort care: Initiated on 10/11/2024 after discussion with patient's husband as above.  Patient has some work of breathing and rattling sounds.  Has not received comfort meds overnight. -Discussed with RN to be generous with comfort meds to achieve optimal comfort -Palliative consulted for assistance with end-of-life care -Anticipate in-hospital death.  Severe Sepsis with septic shock due to empyema/loculated pleural effusion-POA -Right Thora on 10/15- 200 cc exudative and culture negative fluid.  Cytology negative for malignant cells. -Left Thora on 10/17 -350 cc.  Fluid was not sent for testing -Blood cultures with corynebacterium species in 1 out of 4 bottles. -Pleural fluid culture NGTD. -Urine culture with 30,000 colonies of VRE. -Leukocytosis leukocytosis improved. - Broad-spectrum antibiotics from 10/14-10/19 -Transferred to [facility] after consultation with PCCM on 10/17 -IR consulted by PCCM for left chest tube.  On 10/18 -Transitioned to full comfort care on 10/19  Acute systolic CHF: Possible Takotsubo cardiomyopathy.  TTE with LVEF of 25 to 30%, indeterminate DD, normal RVSP and moderate AR.  Acute on chronic respiratory  failure with hypoxia: Requiring 50 L HFNC mainly due to WOB. Bilateral pleural effusion/left empyema COPD exacerbation/OSA -Transition to full comfort care.  Elevated troponin/type II MI:  Troponin elevated to 946 and trended to 995.  EKG seems to show T wave inversions in lateral leads but poor quality.  BNP elevated to 791.  Unlikely ACS per cardiology.  Not a candidate for invasive intervention.  -Transition to full comfort care.  Acute metabolic encephalopathy/hospital delirium: Currently awake and alert but confused, refusing care and agitated.  Requiring restraints.  Acute on chronic anemia: H&H stable after initial drop.  No overt bleeding.  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 -Continue colostomy care  History of perforated duodenal ulcer and bleeding s/p repair and GDA embolization on 06/25/2024  Dysphagia: On dysphagia 3 diet from 06/2024 admission  - Comfort feeding  Hypothyroidism: TSH was 23 on 7/12.  VRE UTI?  Unclear if she had UTI symptoms.  Was on Zyvox.  Hyponatremia: Mild.  Improved.  Hypokalemia-full comfort care   AKI ruled out.  Has CKD-2.   Morbid obesity Body mass index is 37.82 kg/m.  Pressure skin injury: Present on admission Wound 10/06/24 0430 Pressure Injury Sacrum Mid Stage 4 - Full thickness tissue loss with exposed bone, tendon or muscle. (Active)   DVT prophylaxis:    Code Status: DNR-comfort. Family Communication: Updated patient's husband who is very appreciative of her care Level of care: Palliative Care Status is: Inpatient Remains inpatient appropriate because: End-of-life care   Final disposition: To be determined   65 minutes with more than 50% spent in reviewing records, counseling patient/family and coordinating care.  Consultants:  Pulmonology Interventional radiology Cardiology  Procedures: 10/15-left thoracocentesis 10/17-right thoracocentesis  Microbiology summarized: 10/14-full RVP nonreactive 10/14-blood culture with corynebacterium species in 1 out of 4 bottles likely contaminant 10/15-pleural fluid culture NGTD  Objective: Vitals:   10/11/24 1600 10/11/24 1900  10/11/24 2245 10/12/24 0730  BP: 101/65 104/66 117/66 114/68  Pulse: 94 90 75 (!) 42  Resp: (!) 25 20 20 18   Temp: 97.8 F (36.6 C) 97.6 F (36.4 C)  (!) 97 F (36.1 C)  TempSrc: Oral Oral  Oral  SpO2: 93% (!) 89% 95% (!) 70%  Weight:      Height:        Examination:  GENERAL: Work of breathing RESP: Notable work of breathing.  Rattly sound. CVS:  RRR. Heart sounds normal.  NEURO: Sleepy but wakes to voice.  Not alert. PSYCH: Calm.  No agitation.  Sch Meds:  Scheduled Meds:   Continuous Infusions:   PRN Meds:.artificial tears, glycopyrrolate  **OR** glycopyrrolate  **OR** glycopyrrolate , haloperidol  **OR** haloperidol  **OR** haloperidol  lactate, LORazepam **OR** LORazepam **OR** LORazepam, morphine  injection, ondansetron  **OR** ondansetron  (ZOFRAN ) IV  Antimicrobials: Anti-infectives (From admission, onward)    Start     Dose/Rate Route Frequency Ordered Stop   10/11/24 1400  meropenem (MERREM) 1 g in sodium chloride  0.9 % 100 mL IVPB  Status:  Discontinued        1 g 200 mL/hr over 30 Minutes Intravenous Every 8 hours 10/11/24 0912 10/11/24 1447   10/10/24 0000  metroNIDAZOLE  (FLAGYL ) IVPB 500 mg        500 mg 100 mL/hr over 60 Minutes Intravenous Every 12 hours 10/09/24 1841 10/10/24 0014   10/09/24 2200  linezolid (ZYVOX) IVPB 600 mg  Status:  Discontinued        600 mg 300 mL/hr over 60 Minutes Intravenous Every 12 hours 10/09/24 1834 10/11/24 1447  10/09/24 2000  ceFEPIme (MAXIPIME) 2 g in sodium chloride  0.9 % 100 mL IVPB  Status:  Discontinued        2 g 200 mL/hr over 30 Minutes Intravenous Every 8 hours 10/09/24 1141 10/11/24 0826   10/06/24 1200  metroNIDAZOLE  (FLAGYL ) IVPB 500 mg  Status:  Discontinued        500 mg 100 mL/hr over 60 Minutes Intravenous Every 12 hours 10/06/24 0933 10/09/24 1841   10/06/24 1200  vancomycin  (VANCOCIN ) IVPB 1000 mg/200 mL premix  Status:  Discontinued        1,000 mg 200 mL/hr over 60 Minutes Intravenous Every 24 hours  10/06/24 1039 10/09/24 1834   10/06/24 1130  ceFEPIme (MAXIPIME) 2 g in sodium chloride  0.9 % 100 mL IVPB  Status:  Discontinued        2 g 200 mL/hr over 30 Minutes Intravenous Every 12 hours 10/06/24 1039 10/09/24 1141   10/06/24 0145  aztreonam (AZACTAM) 2 g in sodium chloride  0.9 % 100 mL IVPB        2 g 200 mL/hr over 30 Minutes Intravenous  Once 10/06/24 0139 10/06/24 0215   10/06/24 0145  metroNIDAZOLE  (FLAGYL ) IVPB 500 mg        500 mg 100 mL/hr over 60 Minutes Intravenous  Once 10/06/24 0139 10/06/24 0252   10/06/24 0145  vancomycin  (VANCOCIN ) IVPB 1000 mg/200 mL premix        1,000 mg 200 mL/hr over 60 Minutes Intravenous  Once 10/06/24 0139 10/06/24 0251        I have personally reviewed the following labs and images: CBC: Recent Labs  Lab 10/05/24 2353 10/06/24 0447 10/08/24 0443 10/09/24 0332 10/10/24 0247 10/10/24 0920 10/11/24 0900  WBC 20.6*   < > 13.6* 10.8* 10.4 9.1 11.5*  NEUTROABS 19.0*  --   --   --   --   --   --   HGB 10.3*   < > 8.0* 7.8* 8.8* 8.5* 9.0*  HCT 34.0*   < > 26.9* 25.7* 28.6* 28.0* 29.4*  MCV 89.9   < > 91.2 89.9 88.5 89.7 88.8  PLT 468*   < > 288 263 328 331 345   < > = values in this interval not displayed.   BMP &GFR Recent Labs  Lab 10/09/24 0332 10/09/24 0333 10/10/24 0247 10/10/24 0920 10/11/24 0900  NA 134* 135 134* 133* 134*  K 3.3* 3.2* 3.0* 3.1* 2.4*  CL 97* 97* 95* 96* 96*  CO2 29 29 26 27 27   GLUCOSE 86 87 101* 91 84  BUN 19 20 20 19 20   CREATININE 0.78 0.78 0.98 0.93 0.99  CALCIUM  9.3 9.4 9.6 9.3 9.4  MG  --   --   --  1.6* 1.8  PHOS  --  3.0  --  2.7  --    Estimated Creatinine Clearance: 62.9 mL/min (by C-G formula based on SCr of 0.99 mg/dL). Liver & Pancreas: Recent Labs  Lab 10/05/24 2353 10/07/24 0500 10/09/24 0333 10/10/24 0920 10/11/24 0900  AST 30 17  --   --  21  ALT 7 <5  --   --  11  ALKPHOS 96 86  --   --  58  BILITOT 0.3 0.3  --   --  0.7  PROT 7.1 5.7*  --   --  6.1*  ALBUMIN  2.5*  2.1* 2.1* <1.5* 1.6*   No results for input(s): LIPASE, AMYLASE in the last 168 hours. Recent Labs  Lab 10/06/24 1055  AMMONIA <13   Diabetic: No results for input(s): HGBA1C in the last 72 hours. Recent Labs  Lab 10/06/24 0431  GLUCAP 124*   Cardiac Enzymes: No results for input(s): CKTOTAL, CKMB, CKMBINDEX, TROPONINI in the last 168 hours. No results for input(s): PROBNP in the last 8760 hours. Coagulation Profile: Recent Labs  Lab 10/05/24 2353  INR 1.2   Thyroid  Function Tests: No results for input(s): TSH, T4TOTAL, FREET4, T3FREE, THYROIDAB in the last 72 hours. Lipid Profile: No results for input(s): CHOL, HDL, LDLCALC, TRIG, CHOLHDL, LDLDIRECT in the last 72 hours. Anemia Panel: No results for input(s): VITAMINB12, FOLATE, FERRITIN, TIBC, IRON, RETICCTPCT in the last 72 hours. Urine analysis:    Component Value Date/Time   COLORURINE AMBER (A) 10/06/2024 2320   APPEARANCEUR CLOUDY (A) 10/06/2024 2320   LABSPEC 1.019 10/06/2024 2320   PHURINE 5.0 10/06/2024 2320   GLUCOSEU NEGATIVE 10/06/2024 2320   HGBUR MODERATE (A) 10/06/2024 2320   BILIRUBINUR NEGATIVE 10/06/2024 2320   KETONESUR NEGATIVE 10/06/2024 2320   PROTEINUR 100 (A) 10/06/2024 2320   NITRITE NEGATIVE 10/06/2024 2320   LEUKOCYTESUR LARGE (A) 10/06/2024 2320   Sepsis Labs: Invalid input(s): PROCALCITONIN, LACTICIDVEN  Microbiology: Recent Results (from the past 240 hours)  Blood Culture (routine x 2)     Status: Abnormal   Collection Time: 10/05/24 11:53 PM   Specimen: BLOOD  Result Value Ref Range Status   Specimen Description   Final    BLOOD BLOOD RIGHT FOREARM Performed at [facility], [address] [place], [place] [address]    Special Requests   Final    BOTTLES DRAWN AEROBIC AND ANAEROBIC Blood Culture adequate volume Performed at [facility], [address], [place], [place] [address]    Culture  Setup Time   Final    AEROBIC  BOTTLE ONLY GRAM POSITIVE RODS Gram Stain Report Called to,Read Back By and Verified With: JAYSON AHLE RN [phone] MARLA CAROLIN Performed at [facility], [address] [place], [place] [address]    Culture (A)  Final    CORYNEBACTERIUM SPECIES CORYNEBACTERIUM OTITIDIS Performed at [facility], [address], [place], [place] [address]    Report Status 10/10/2024 FINAL  Final  Blood Culture (routine x 2)     Status: None   Collection Time: 10/06/24 12:38 AM   Specimen: BLOOD  Result Value Ref Range Status   Specimen Description BLOOD LEFT ANTECUBITAL  Final   Special Requests   Final    BOTTLES DRAWN AEROBIC AND ANAEROBIC Blood Culture results may not be optimal due to an inadequate volume of blood received in culture bottles   Culture   Final    NO GROWTH 5 DAYS Performed at [facility], [address], [place], [place] [address]    Report Status 10/11/2024 FINAL  Final  MRSA Next Gen by PCR, Nasal     Status: None   Collection Time: 10/06/24  4:22 AM   Specimen: Nasal Mucosa; Nasal Swab  Result Value Ref Range Status   MRSA by PCR Next Gen NOT DETECTED NOT DETECTED Final    Comment: (NOTE) The GeneXpert MRSA Assay (FDA approved for NASAL specimens only), is one component of a comprehensive MRSA colonization surveillance program. It is not intended to diagnose MRSA infection nor to guide or monitor treatment for MRSA infections. Test performance is not FDA approved in patients less than 24 years old. Performed at [facility], [address], [place], [place] [address]  Respiratory (~20 pathogens) panel by PCR     Status: None   Collection Time: 10/06/24 10:56 AM   Specimen: Nasopharyngeal Swab; Respiratory  Result Value Ref Range Status   Adenovirus NOT DETECTED NOT DETECTED Final   Coronavirus 229E NOT DETECTED NOT DETECTED Final    Comment: (NOTE) The Coronavirus on the Respiratory Panel, DOES NOT test for the novel  Coronavirus (2019 nCoV)    Coronavirus HKU1 NOT  DETECTED NOT DETECTED Final   Coronavirus NL63 NOT DETECTED NOT DETECTED Final   Coronavirus OC43 NOT DETECTED NOT DETECTED Final   Metapneumovirus NOT DETECTED NOT DETECTED Final   Rhinovirus / Enterovirus NOT DETECTED NOT DETECTED Final   Influenza A NOT DETECTED NOT DETECTED Final   Influenza B NOT DETECTED NOT DETECTED Final   Parainfluenza Virus 1 NOT DETECTED NOT DETECTED Final   Parainfluenza Virus 2 NOT DETECTED NOT DETECTED Final   Parainfluenza Virus 3 NOT DETECTED NOT DETECTED Final   Parainfluenza Virus 4 NOT DETECTED NOT DETECTED Final   Respiratory Syncytial Virus NOT DETECTED NOT DETECTED Final   Bordetella pertussis NOT DETECTED NOT DETECTED Final   Bordetella Parapertussis NOT DETECTED NOT DETECTED Final   Chlamydophila pneumoniae NOT DETECTED NOT DETECTED Final   Mycoplasma pneumoniae NOT DETECTED NOT DETECTED Final    Comment: Performed at [facility], [address], [place], [place] [address]  Urine Culture     Status: Abnormal   Collection Time: 10/06/24 11:20 PM   Specimen: Urine, Random  Result Value Ref Range Status   Specimen Description   Final    URINE, RANDOM Performed at [facility], [address] [place], [place] [address]    Special Requests   Final    NONE Reflexed from [id number] Performed at [facility], [address] [place], [place] [address]    Culture (A)  Final    30,000 COLONIES/mL ENTEROCOCCUS FAECALIS VANCOMYCIN  RESISTANT ENTEROCOCCUS    Report Status 10/09/2024 FINAL  Final   Organism ID, Bacteria ENTEROCOCCUS FAECALIS (A)  Final      Susceptibility   Enterococcus faecalis - MIC*    AMPICILLIN <=2 SENSITIVE Sensitive     NITROFURANTOIN <=16 SENSITIVE Sensitive     VANCOMYCIN  >=32 RESISTANT Resistant     * 30,000 COLONIES/mL ENTEROCOCCUS FAECALIS  Gram stain     Status: None   Collection Time: 10/07/24 12:45 PM   Specimen: Pleura  Result Value Ref Range Status   Specimen Description PLEURAL  Final   Special Requests  PLEURAL  Final   Gram Stain   Final    NO ORGANISMS SEEN WBC PRESENT, PREDOMINANTLY PMN Performed at [facility], [address] [place], [place] [address]    Report Status 10/07/2024 FINAL  Final  Culture, body fluid w Gram Stain-bottle     Status: None   Collection Time: 10/07/24 12:45 PM   Specimen: Pleura  Result Value Ref Range Status   Specimen Description PLEURAL LEFT  Final   Special Requests   Final    BOTTLES DRAWN AEROBIC AND ANAEROBIC Blood Culture adequate volume   Culture   Final    NO GROWTH 5 DAYS Performed at [facility], [address] [place], [place] [address]    Report Status 10/12/2024 FINAL  Final    Radiology Studies: ECHOCARDIOGRAM COMPLETE Result Date: 10/11/2024    ECHOCARDIOGRAM REPORT   Patient Name:   JACELYNN HAYTON Date of Exam: 10/11/2024 Medical Rec #:  [id number]  Height:       65.0 in Accession #:    [id number]      Weight:       227.3 lb Date of Birth:  Apr 22, 1956       BSA:          2.089 m Patient Age:    68 years        BP:           110/85 mmHg Patient Gender: F               HR:           97 bpm. Exam Location:  Inpatient Procedure: 2D Echo and Intracardiac Opacification Agent (Both Spectral and Color            Flow Doppler were utilized during procedure). Indications:    CHF  History:        Patient has prior history of Echocardiogram examinations. CHF.  Sonographer:    Norleen Amour Referring Phys: MIGNON ONEIDA BUMP IMPRESSIONS  1. Takatsubo like ventrical with akinesis of mid/apical walls and hyperdynamic basal function . Left ventricular ejection fraction, by estimation, is 25 to 30%. The left ventricle has severely decreased function. The left ventricle has no regional wall motion abnormalities. The left ventricular internal cavity size was severely dilated. Left ventricular diastolic parameters are indeterminate.  2. Right ventricular systolic function is normal. The right ventricular size is normal.  3. Left atrial size was mildly dilated.   4. The mitral valve is abnormal. Trivial mitral valve regurgitation. No evidence of mitral stenosis. Moderate mitral annular calcification.  5. The aortic valve is tricuspid. There is moderate calcification of the aortic valve. There is moderate thickening of the aortic valve. Aortic valve regurgitation is moderate. Aortic valve sclerosis is present, with no evidence of aortic valve stenosis.  6. The inferior vena cava is dilated in size with >50% respiratory variability, suggesting right atrial pressure of 8 mmHg. FINDINGS  Left Ventricle: Takatsubo like ventrical with akinesis of mid/apical walls and hyperdynamic basal function. Left ventricular ejection fraction, by estimation, is 25 to 30%. The left ventricle has severely decreased function. The left ventricle has no regional wall motion abnormalities. Definity contrast agent was given IV to delineate the left ventricular endocardial borders. Strain was performed and the global longitudinal strain is indeterminate. The left ventricular internal cavity size was severely dilated. There is no left ventricular hypertrophy. Left ventricular diastolic parameters are indeterminate. Right Ventricle: The right ventricular size is normal. No increase in right ventricular wall thickness. Right ventricular systolic function is normal. Left Atrium: Left atrial size was mildly dilated. Right Atrium: Right atrial size was normal in size. Pericardium: There is no evidence of pericardial effusion. Mitral Valve: The mitral valve is abnormal. There is mild thickening of the mitral valve leaflet(s). There is mild calcification of the mitral valve leaflet(s). Moderate mitral annular calcification. Trivial mitral valve regurgitation. No evidence of mitral valve stenosis. Tricuspid Valve: The tricuspid valve is normal in structure. Tricuspid valve regurgitation is mild . No evidence of tricuspid stenosis. Aortic Valve: The aortic valve is tricuspid. There is moderate calcification of  the aortic valve. There is moderate thickening of the aortic valve. Aortic valve regurgitation is moderate. Aortic valve sclerosis is present, with no evidence of aortic valve stenosis. Pulmonic Valve: The pulmonic valve was normal in structure. Pulmonic valve regurgitation is not visualized. No evidence of pulmonic stenosis. Aorta: The aortic root is normal in size and structure. Venous: The  inferior vena cava is dilated in size with greater than 50% respiratory variability, suggesting right atrial pressure of 8 mmHg. IAS/Shunts: No atrial level shunt detected by color flow Doppler. Additional Comments: 3D was performed not requiring image post processing on an independent workstation and was indeterminate.  LEFT VENTRICLE PLAX 2D LVIDd:         4.50 cm      Diastology LVIDs:         2.50 cm      LV e' medial:    8.70 cm/s LV PW:         0.90 cm      LV E/e' medial:  12.5 LV IVS:        0.90 cm      LV e' lateral:   6.53 cm/s LVOT diam:     1.80 cm      LV E/e' lateral: 16.7 LV SV:         47 LV SV Index:   23 LVOT Area:     2.54 cm  LV Volumes (MOD) LV vol d, MOD A2C: 151.0 ml LV vol d, MOD A4C: 165.0 ml LV vol s, MOD A2C: 103.0 ml LV vol s, MOD A4C: 55.1 ml LV SV MOD A2C:     48.0 ml LV SV MOD A4C:     165.0 ml LV SV MOD BP:      84.5 ml RIGHT VENTRICLE             IVC RV Basal diam:  3.60 cm     IVC diam: 2.00 cm RV S prime:     10.20 cm/s TAPSE (M-mode): 1.8 cm LEFT ATRIUM              Index        RIGHT ATRIUM           Index LA diam:        3.90 cm  1.87 cm/m   RA Area:     14.30 cm LA Vol (A2C):   113.0 ml 54.10 ml/m  RA Volume:   35.30 ml  16.90 ml/m LA Vol (A4C):   91.5 ml  43.81 ml/m LA Biplane Vol: 103.0 ml 49.31 ml/m  AORTIC VALVE             PULMONIC VALVE LVOT Vmax:   112.00 cm/s PV Vmax:       0.99 m/s LVOT Vmean:  76.700 cm/s PV Peak grad:  3.9 mmHg LVOT VTI:    0.186 m  AORTA Ao Root diam: 3.10 cm Ao Asc diam:  2.90 cm MITRAL VALVE MV Area (PHT): 2.95 cm     SHUNTS MV Decel Time: 257 msec      Systemic VTI:  0.19 m MV E velocity: 109.00 cm/s  Systemic Diam: 1.80 cm MV A velocity: 103.00 cm/s MV E/A ratio:  1.06 Maude Emmer MD Electronically signed by Maude Emmer MD Signature Date/Time: 10/11/2024/3:42:14 PM    Final        Clora Ohmer T. Ovid Witman [facility]  If 7PM-7AM, please contact night-coverage [email or web address] 10/12/2024, 2:09 PM

## 2024-10-13 DIAGNOSIS — J9621 Acute and chronic respiratory failure with hypoxia: Secondary | ICD-10-CM | POA: Diagnosis not present

## 2024-10-13 DIAGNOSIS — Z515 Encounter for palliative care: Secondary | ICD-10-CM | POA: Diagnosis not present

## 2024-10-13 NOTE — TOC Progression Note (Signed)
 Transition of Care [facility]) - Progression Note    Patient Details  Name: Christina Castaneda MRN: [id number] Date of Birth: 1956/09/19  Transition of Care [facility]) CM/SW Contact  Luise JAYSON Pan, [place] Phone Number: 10/13/2024, 8:19 AM  Clinical Narrative:   Per chart review, patient is an anticipated hospital death.   CSW will continue to follow/monitor.    Expected Discharge Plan: Skilled Nursing Facility Barriers to Discharge: Continued Medical Work up               Expected Discharge Plan and Services In-house Referral: Clinical Social Work Discharge Planning Services: CM Consult Post Acute Care Choice: Skilled Nursing Facility                                         Social Drivers of Health (SDOH) Interventions SDOH Screenings   Food Insecurity: Patient Unable To Answer (10/06/2024)  Housing: Patient Unable To Answer (10/06/2024)  Transportation Needs: Patient Unable To Answer (10/06/2024)  Utilities: Patient Unable To Answer (10/06/2024)  Social Connections: Unknown (10/06/2024)  Tobacco Use: High Risk (10/07/2024)    Readmission Risk Interventions    10/07/2024   10:14 AM 05/27/2024   10:01 PM  Readmission Risk Prevention Plan  Post Dischage Appt  Complete  Medication Screening  Complete  Transportation Screening Complete Complete  HRI or Home Care Consult Complete   Social Work Consult for Recovery Care Planning/Counseling Complete   Palliative Care Screening Not Applicable   Medication Review Oceanographer) Complete

## 2024-10-13 NOTE — Progress Notes (Signed)
 PROGRESS NOTE  Christina Castaneda FMW:[id number] DOB: 30-Dec-1955   PCP: [name], DO  Patient is from: SNF, [facility]  DOA: 10/05/2024 LOS: 7  Chief complaints Chief Complaint  Patient presents with   Altered Mental Status     Brief Narrative / Interim history: 68 year old F with PMH of COPD, OSA, hypoxic RF on 3 L, perforated sigmoid colon in the setting of diverticulitis s/p colostomy in 2017, duodenal ulcer perforation s/p ex lap and repair, prior tracheostomy, CKD-2 and morbid obesity admitted to [facility] from SNF facility on 10/06/2024 with severe sepsis septic shock due to empyema/loculated pleural effusion with acute hypoxic respiratory failure.   Patient was started on vasopressor, stress dose steroid, broad-spectrum antibiotics and admitted to ICU at [facility].  She underwent right thoracocentesis on 10/15 with removal of 200 cc exudative and culture negative amber-colored fluid She also underwent left thoracocentesis on 10/17 with removal of 350 cc clear of fluid.  No malignant cells in pleural fluid.  Patient was weaned off vasopressor but remained on stress dose steroid.  She has worsening respiratory distress and confusion.  After consultation with PCCM, she was transferred to [facility] for further evaluation.  Increased respiratory distress requiring up to 50 L by HFNC.  Also ongoing delirium and agitation requiring restraints.  Extensive discussion with patient's husband, Mr. Bia over the phone on 10/11/2024.  Updated Mr. Smiley with patient's condition and overall poor prognosis.  We have discussed about treatment options including continuation of current treatment or full comfort care. Per Mr. Brightbill they have no children.  He stated that she has suffered a lot.  He says I do not want to lose her but I also do not want her to continue suffering.  I think she is ready.  Make her comfortable.  I have explained what comfort care entails including  use of medication such as IV morphine  sev alleviate pain, air hunger, anxiety and agitation.  He voiced understanding and appreciated the call.    Patient transition to full comfort care on 10/19.  Anticipate in-hospital death.  Palliative following.   Subjective: Seen and examined earlier this morning.  No major events overnight or this morning.  Sleeping.  Looks comfortable.   Assessment and plan: End-of-life care/full comfort care: Initiated on 10/11/2024 after discussion with patient's husband as above.  Appears comfortable today. -Palliative consulted for assistance with end-of-life care-following. -Continue current comfort meds as ordered -Anticipate in-hospital death.  Severe Sepsis with septic shock due to empyema/loculated pleural effusion-POA -Right Thora on 10/15- 200 cc exudative and culture negative fluid.  Cytology negative for malignant cells. -Left Thora on 10/17 -350 cc.  Fluid was not sent for testing -Blood cultures with corynebacterium species in 1 out of 4 bottles. -Pleural fluid culture NGTD. -Urine culture with 30,000 colonies of VRE. -Leukocytosis leukocytosis improved. - Broad-spectrum antibiotics from 10/14-10/19 -Transferred to [facility] after consultation with PCCM on 10/17 -IR consulted by PCCM for left chest tube.  On 10/18 -Transitioned to full comfort care on 10/19  Acute systolic CHF: Possible Takotsubo cardiomyopathy.  TTE with LVEF of 25 to 30%, indeterminate DD, normal RVSP and moderate AR.  Acute on chronic respiratory failure with hypoxia: Requiring 50 L HFNC mainly due to WOB. Bilateral pleural effusion/left empyema COPD exacerbation/OSA -Transition to full comfort care.  Elevated troponin/type II MI: Troponin elevated to 946 and trended to 995.  EKG seems to show T wave inversions in lateral leads but poor quality.  BNP elevated to 791.  Unlikely ACS per cardiology.  Not a candidate for invasive intervention.  -Transition to full comfort  care.  Acute metabolic encephalopathy/hospital delirium: Currently awake and alert but confused, refusing care and agitated.  Requiring restraints.  Acute on chronic anemia: H&H stable after initial drop.  No overt bleeding.  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 -Continue colostomy care  History of perforated duodenal ulcer and bleeding s/p repair and GDA embolization on 06/25/2024  Dysphagia: On dysphagia 3 diet from 06/2024 admission  - Comfort feeding  Hypothyroidism: TSH was 23 on 7/12.  VRE UTI?  Unclear if she had UTI symptoms.  Was on Zyvox.  Hyponatremia: Mild.  Improved.  Hypokalemia-full comfort care   AKI ruled out.  Has CKD-2.   Morbid obesity Body mass index is 37.82 kg/m.  Pressure skin injury: Present on admission Wound 10/06/24 0430 Pressure Injury Sacrum Mid Stage 4 - Full thickness tissue loss with exposed bone, tendon or muscle. (Active)   DVT prophylaxis:  Patient is full comfort care.  Code Status: DNR-comfort. Family Communication: Updated patient's husband over the phone on 10/20.  Palliative updated husband today Level of care: Palliative Care Status is: Inpatient Remains inpatient appropriate because: End-of-life care   Final disposition: Anticipate in-hospital death   35 minutes with more than 50% spent in reviewing records, counseling patient/family and coordinating care.  Consultants:  Pulmonology Interventional radiology Cardiology  Procedures: 10/15-left thoracocentesis 10/17-right thoracocentesis  Microbiology summarized: 10/14-full RVP nonreactive 10/14-blood culture with corynebacterium species in 1 out of 4 bottles likely contaminant 10/15-pleural fluid culture NGTD  Objective: Vitals:   10/11/24 1900 10/11/24 2245 10/12/24 0730 10/13/24 0748  BP: 104/66 117/66 114/68 (!) 163/97  Pulse: 90 75 (!) 42   Resp: 20 20 18  (!) 22  Temp: 97.6 F (36.4 C)  (!) 97 F (36.1 C) 97.6 F (36.4 C)   TempSrc: Oral  Oral Axillary  SpO2: (!) 89% 95% (!) 70% 100%  Weight:      Height:        Examination:  GENERAL: Work of breathing RESP: Notable work of breathing.  Rattly sound. CVS:  RRR. Heart sounds normal.  NEURO: Sleepy but wakes to voice.  Not alert. PSYCH: Calm.  No agitation.  Sch Meds:  Scheduled Meds:  LORazepam  1-2 mg Intravenous Q4H    morphine  injection  2 mg Intravenous Q4H    Continuous Infusions:   PRN Meds:.artificial tears, glycopyrrolate  **OR** glycopyrrolate  **OR** glycopyrrolate , haloperidol  **OR** haloperidol  **OR** haloperidol  lactate, LORazepam **AND** LORazepam, morphine  injection **AND** morphine  injection, ondansetron  **OR** ondansetron  (ZOFRAN ) IV  Antimicrobials: Anti-infectives (From admission, onward)    Start     Dose/Rate Route Frequency Ordered Stop   10/11/24 1400  meropenem (MERREM) 1 g in sodium chloride  0.9 % 100 mL IVPB  Status:  Discontinued        1 g 200 mL/hr over 30 Minutes Intravenous Every 8 hours 10/11/24 0912 10/11/24 1447   10/10/24 0000  metroNIDAZOLE  (FLAGYL ) IVPB 500 mg        500 mg 100 mL/hr over 60 Minutes Intravenous Every 12 hours 10/09/24 1841 10/10/24 0014   10/09/24 2200  linezolid (ZYVOX) IVPB 600 mg  Status:  Discontinued        600 mg 300 mL/hr over 60 Minutes Intravenous Every 12 hours 10/09/24 1834 10/11/24 1447   10/09/24 2000  ceFEPIme (MAXIPIME) 2 g in sodium chloride  0.9 % 100 mL IVPB  Status:  Discontinued  2 g 200 mL/hr over 30 Minutes Intravenous Every 8 hours 10/09/24 1141 10/11/24 0826   10/06/24 1200  metroNIDAZOLE  (FLAGYL ) IVPB 500 mg  Status:  Discontinued        500 mg 100 mL/hr over 60 Minutes Intravenous Every 12 hours 10/06/24 0933 10/09/24 1841   10/06/24 1200  vancomycin  (VANCOCIN ) IVPB 1000 mg/200 mL premix  Status:  Discontinued        1,000 mg 200 mL/hr over 60 Minutes Intravenous Every 24 hours 10/06/24 1039 10/09/24 1834   10/06/24 1130  ceFEPIme (MAXIPIME) 2 g in sodium  chloride 0.9 % 100 mL IVPB  Status:  Discontinued        2 g 200 mL/hr over 30 Minutes Intravenous Every 12 hours 10/06/24 1039 10/09/24 1141   10/06/24 0145  aztreonam (AZACTAM) 2 g in sodium chloride  0.9 % 100 mL IVPB        2 g 200 mL/hr over 30 Minutes Intravenous  Once 10/06/24 0139 10/06/24 0215   10/06/24 0145  metroNIDAZOLE  (FLAGYL ) IVPB 500 mg        500 mg 100 mL/hr over 60 Minutes Intravenous  Once 10/06/24 0139 10/06/24 0252   10/06/24 0145  vancomycin  (VANCOCIN ) IVPB 1000 mg/200 mL premix        1,000 mg 200 mL/hr over 60 Minutes Intravenous  Once 10/06/24 0139 10/06/24 0251        I have personally reviewed the following labs and images: CBC: Recent Labs  Lab 10/08/24 0443 10/09/24 0332 10/10/24 0247 10/10/24 0920 10/11/24 0900  WBC 13.6* 10.8* 10.4 9.1 11.5*  HGB 8.0* 7.8* 8.8* 8.5* 9.0*  HCT 26.9* 25.7* 28.6* 28.0* 29.4*  MCV 91.2 89.9 88.5 89.7 88.8  PLT 288 263 328 331 345   BMP &GFR Recent Labs  Lab 10/09/24 0332 10/09/24 0333 10/10/24 0247 10/10/24 0920 10/11/24 0900  NA 134* 135 134* 133* 134*  K 3.3* 3.2* 3.0* 3.1* 2.4*  CL 97* 97* 95* 96* 96*  CO2 29 29 26 27 27   GLUCOSE 86 87 101* 91 84  BUN 19 20 20 19 20   CREATININE 0.78 0.78 0.98 0.93 0.99  CALCIUM  9.3 9.4 9.6 9.3 9.4  MG  --   --   --  1.6* 1.8  PHOS  --  3.0  --  2.7  --    Estimated Creatinine Clearance: 62.9 mL/min (by C-G formula based on SCr of 0.99 mg/dL). Liver & Pancreas: Recent Labs  Lab 10/07/24 0500 10/09/24 0333 10/10/24 0920 10/11/24 0900  AST 17  --   --  21  ALT <5  --   --  11  ALKPHOS 86  --   --  58  BILITOT 0.3  --   --  0.7  PROT 5.7*  --   --  6.1*  ALBUMIN  2.1* 2.1* <1.5* 1.6*   No results for input(s): LIPASE, AMYLASE in the last 168 hours. No results for input(s): AMMONIA in the last 168 hours.  Diabetic: No results for input(s): HGBA1C in the last 72 hours. No results for input(s): GLUCAP in the last 168 hours.  Cardiac Enzymes: No  results for input(s): CKTOTAL, CKMB, CKMBINDEX, TROPONINI in the last 168 hours. No results for input(s): PROBNP in the last 8760 hours. Coagulation Profile: No results for input(s): INR, PROTIME in the last 168 hours.  Thyroid  Function Tests: No results for input(s): TSH, T4TOTAL, FREET4, T3FREE, THYROIDAB in the last 72 hours. Lipid Profile: No results for input(s): CHOL, HDL, LDLCALC,  TRIG, CHOLHDL, LDLDIRECT in the last 72 hours. Anemia Panel: No results for input(s): VITAMINB12, FOLATE, FERRITIN, TIBC, IRON, RETICCTPCT in the last 72 hours. Urine analysis:    Component Value Date/Time   COLORURINE AMBER (A) 10/06/2024 2320   APPEARANCEUR CLOUDY (A) 10/06/2024 2320   LABSPEC 1.019 10/06/2024 2320   PHURINE 5.0 10/06/2024 2320   GLUCOSEU NEGATIVE 10/06/2024 2320   HGBUR MODERATE (A) 10/06/2024 2320   BILIRUBINUR NEGATIVE 10/06/2024 2320   KETONESUR NEGATIVE 10/06/2024 2320   PROTEINUR 100 (A) 10/06/2024 2320   NITRITE NEGATIVE 10/06/2024 2320   LEUKOCYTESUR LARGE (A) 10/06/2024 2320   Sepsis Labs: Invalid input(s): PROCALCITONIN, LACTICIDVEN  Microbiology: Recent Results (from the past 240 hours)  Blood Culture (routine x 2)     Status: Abnormal   Collection Time: 10/05/24 11:53 PM   Specimen: BLOOD  Result Value Ref Range Status   Specimen Description   Final    BLOOD BLOOD RIGHT FOREARM Performed at [facility], [address] [place], [place] [address]    Special Requests   Final    BOTTLES DRAWN AEROBIC AND ANAEROBIC Blood Culture adequate volume Performed at [facility], [address], [place], [place] [address]    Culture  Setup Time   Final    AEROBIC BOTTLE ONLY GRAM POSITIVE RODS Gram Stain Report Called to,Read Back By and Verified With: JAYSON AHLE RN [id number] MARLA CAROLIN Performed at [facility], [address] [place], [place] [address]    Culture (A)  Final    CORYNEBACTERIUM SPECIES CORYNEBACTERIUM  OTITIDIS Performed at [facility], [address] [place], [place] [address]    Report Status 10/10/2024 FINAL  Final  Blood Culture (routine x 2)     Status: None   Collection Time: 10/06/24 12:38 AM   Specimen: BLOOD  Result Value Ref Range Status   Specimen Description BLOOD LEFT ANTECUBITAL  Final   Special Requests   Final    BOTTLES DRAWN AEROBIC AND ANAEROBIC Blood Culture results may not be optimal due to an inadequate volume of blood received in culture bottles   Culture   Final    NO GROWTH 5 DAYS Performed at [facility], [address], [place], [place] [address]    Report Status 10/11/2024 FINAL  Final  MRSA Next Gen by PCR, Nasal     Status: None   Collection Time: 10/06/24  4:22 AM   Specimen: Nasal Mucosa; Nasal Swab  Result Value Ref Range Status   MRSA by PCR Next Gen NOT DETECTED NOT DETECTED Final    Comment: (NOTE) The GeneXpert MRSA Assay (FDA approved for NASAL specimens only), is one component of a comprehensive MRSA colonization surveillance program. It is not intended to diagnose MRSA infection nor to guide or monitor treatment for MRSA infections. Test performance is not FDA approved in patients less than 35 years old. Performed at [facility], [address] [place], [place] [address]   Respiratory (~20 pathogens) panel by PCR     Status: None   Collection Time: 10/06/24 10:56 AM   Specimen: Nasopharyngeal Swab; Respiratory  Result Value Ref Range Status   Adenovirus NOT DETECTED NOT DETECTED Final   Coronavirus 229E NOT DETECTED NOT DETECTED Final    Comment: (NOTE) The Coronavirus on the Respiratory Panel, DOES NOT test for the novel  Coronavirus (2019 nCoV)    Coronavirus HKU1 NOT DETECTED NOT DETECTED Final   Coronavirus NL63 NOT DETECTED NOT DETECTED Final   Coronavirus OC43  NOT DETECTED NOT DETECTED Final   Metapneumovirus NOT DETECTED NOT DETECTED Final   Rhinovirus / Enterovirus NOT DETECTED NOT DETECTED Final   Influenza A NOT  DETECTED NOT DETECTED Final   Influenza B NOT DETECTED NOT DETECTED Final   Parainfluenza Virus 1 NOT DETECTED NOT DETECTED Final   Parainfluenza Virus 2 NOT DETECTED NOT DETECTED Final   Parainfluenza Virus 3 NOT DETECTED NOT DETECTED Final   Parainfluenza Virus 4 NOT DETECTED NOT DETECTED Final   Respiratory Syncytial Virus NOT DETECTED NOT DETECTED Final   Bordetella pertussis NOT DETECTED NOT DETECTED Final   Bordetella Parapertussis NOT DETECTED NOT DETECTED Final   Chlamydophila pneumoniae NOT DETECTED NOT DETECTED Final   Mycoplasma pneumoniae NOT DETECTED NOT DETECTED Final    Comment: Performed at [facility], [address] [place], [place] [address]  Urine Culture     Status: Abnormal   Collection Time: 10/06/24 11:20 PM   Specimen: Urine, Random  Result Value Ref Range Status   Specimen Description   Final    URINE, RANDOM Performed at [facility], [address] [place], [place] [address]    Special Requests   Final    NONE Reflexed from [id number] Performed at [facility], [address], [place], [place] [address]    Culture (A)  Final    30,000 COLONIES/mL ENTEROCOCCUS FAECALIS VANCOMYCIN  RESISTANT ENTEROCOCCUS    Report Status 10/09/2024 FINAL  Final   Organism ID, Bacteria ENTEROCOCCUS FAECALIS (A)  Final      Susceptibility   Enterococcus faecalis - MIC*    AMPICILLIN <=2 SENSITIVE Sensitive     NITROFURANTOIN <=16 SENSITIVE Sensitive     VANCOMYCIN  >=32 RESISTANT Resistant     * 30,000 COLONIES/mL ENTEROCOCCUS FAECALIS  Gram stain     Status: None   Collection Time: 10/07/24 12:45 PM   Specimen: Pleura  Result Value Ref Range Status   Specimen Description PLEURAL  Final   Special Requests PLEURAL  Final   Gram Stain   Final    NO ORGANISMS SEEN WBC PRESENT, PREDOMINANTLY PMN Performed at [facility], [address], [place], [place] [address]    Report Status 10/07/2024 FINAL  Final  Culture, body fluid w Gram Stain-bottle     Status: None    Collection Time: 10/07/24 12:45 PM   Specimen: Pleura  Result Value Ref Range Status   Specimen Description PLEURAL LEFT  Final   Special Requests   Final    BOTTLES DRAWN AEROBIC AND ANAEROBIC Blood Culture adequate volume   Culture   Final    NO GROWTH 5 DAYS Performed at [facility], [address], [place], [place] [address]    Report Status 10/12/2024 FINAL  Final    Radiology Studies: No results found.      Symiah Nowotny T. Jihan Mellette [facility]  If 7PM-7AM, please contact night-coverage [email or web address] 10/13/2024, 3:53 PM

## 2024-10-13 NOTE — Progress Notes (Addendum)
 Palliative:  HPI: 68 y.o. female  with past medical history of COPD, asthma, atypical endometrial hyperplasia, goiter, gout, hypertension, hypothyroidism, and vitamin B12 deficiency, prolonged and complicated admission from 06/07/2024 through 07/10/2024 after she sustained a fall with left femur fracture admitted on 10/05/2024 with fever and confusion. Ongoing decline over course of hospitalization with respiratory failure with left empyema, COPD, and OSA. Decision made for comfort care 10/19.   Chart notes and MAR reviewed. I met today at [facility] bedside along with RN. Mackenzi has been more comfortable since medication regimen adjusted yesterday. She appears comfortable and no dyspnea and not tense as she was yesterday. She is not agitated either. Her eyes do open briefly when I speak to her and examine her. Nasal cannula 1L removed for comfort at this time. No changes to medication regimen at this time. RN to let me know if any further needs.   I called and spoke with Donelle's husband, Randall. I updated him on her condition and that she is much more comfortable. He was happy to know she is resting more peacefully. He shares that the nurses helped him to hold phone up to her ear so he could speak with her although this was difficult for him he appreciated being able to do this. He has no further questions/concerns. Emotional support provided.   Exam: Lethargic. Briefly opens eyes. No distress. Breathing regular but shallow. Abd soft. BLE edema. Bilateral toes cool to touch.   Plan: - DNR/DNI - Full comfort care - Continue meds as ordered - no changes - Anticipate hospital death  35 min  Bernarda Kitty, NP Palliative Medicine Team Pager [phone] (Please see [email or web address] for schedule) Team Phone [phone]

## 2024-10-14 DIAGNOSIS — A419 Sepsis, unspecified organism: Secondary | ICD-10-CM | POA: Diagnosis not present

## 2024-10-14 DIAGNOSIS — Z515 Encounter for palliative care: Secondary | ICD-10-CM | POA: Diagnosis not present

## 2024-10-24 NOTE — Death Summary Note (Signed)
 DEATH SUMMARY   Patient Details  Name: Christina Castaneda MRN: [id number] DOB: 10-29-1956 ERE:Floopd, Mariano SQUIBB, DO Admission/Discharge Information   Admit Date:  October 15, 2024  Date of Death: Date of Death: Oct 24, 2024  Time of Death: Time of Death: 1556  Length of Stay: 8   Principle Cause of death: Septic [facility] Diagnoses: Principal Problem:   Sepsis due to undetermined organism [facility]) Active Problems:   Acute on chronic respiratory failure with hypoxia (HCC)   Encephalopathy   Pressure injury of skin   COPD  GOLD 2/AB   Essential hypertension   Dysphagia   OSA (obstructive sleep apnea)   S/P colostomy (HCC)   Perforated bowel (HCC)   Stage 3 chronic kidney disease (HCC)   Elevated troponin   Hospital Course: Brief Narrative:   68 year old F with PMH of COPD, OSA, hypoxic RF on 3 L, perforated sigmoid colon in the setting of diverticulitis s/p colostomy in 2017, duodenal ulcer perforation s/p ex lap and repair, prior tracheostomy, CKD-2 and morbid obesity admitted to [facility] from SNF facility on 10/06/2024 with severe sepsis septic shock due to empyema/loculated pleural effusion with acute hypoxic respiratory failure.    Patient was started on vasopressor, stress dose steroid, broad-spectrum antibiotics and admitted to ICU at [facility].  She underwent right thoracocentesis on 10/15 with removal of 200 cc exudative and culture negative amber-colored fluid She also underwent left thoracocentesis on 10/17 with removal of 350 cc clear of fluid.  No malignant cells in pleural fluid.   Patient was weaned off vasopressor but remained on stress dose steroid.  She has worsening respiratory distress and confusion.  After consultation with PCCM, she was transferred to [facility] for further evaluation.   Increased respiratory distress requiring up to 50 L by HFNC.  Also ongoing delirium and agitation requiring restraints.   Extensive discussion with patient's  husband, Mr. Costabile over the phone on 10/21/24.  Updated Mr. Smiley with patient's condition and overall poor prognosis.  We have discussed about treatment options including continuation of current treatment or full comfort care. Per Mr. Levandowski they have no children.  He stated that she has suffered a lot.  He says I do not want to lose her but I also do not want her to continue suffering.  I think she is ready.  Make her comfortable.  I have explained what comfort care entails including use of medication such as IV morphine  sev alleviate pain, air hunger, anxiety and agitation.  He voiced understanding and appreciated the call.     Patient transition to full comfort care on 10/21/2024. Passed away in the hospital  Assessment & Plan:  End of Life/Comfort Measures Septic shock secondary to empyema/loculated pleural effusion, POA Acute congestive heart failure reduced EF, 30% Acute respiratory failure with hypoxia requiring 50L HFNC History of perforated duodenal ulcer Hypothyroidism    The results of significant diagnostics from this hospitalization (including imaging, microbiology, ancillary and laboratory) are listed below for reference.   Significant Diagnostic Studies: CT CHEST W CONTRAST Result Date: 10/12/2024 EXAM: CT CHEST WITH CONTRAST Oct 21, 2024 12:57:26 PM TECHNIQUE: CT of the chest was performed with the administration of 75 mL of iohexol  (OMNIPAQUE ) 350 MG/ML intravenous contrast. Multiplanar reformatted images are provided for review. Automated exposure control, iterative reconstruction, and/or weight based adjustment of the mA/kV was utilized to reduce the radiation dose to as low as reasonably achievable. COMPARISON: Lung cancer screening CT from 01/07/2023. CLINICAL HISTORY: Left pleural effusion. FINDINGS:  MEDIASTINUM: Heart and pericardium show moderate cardiac enlargement. Mild aortic atherosclerotic calcification and coronary artery calcifications. Aberrant right subclavian  artery. The central airways are clear. LYMPH NODES: No mediastinal, hilar or axillary lymphadenopathy. LUNGS AND PLEURA: EMPHYSEMA WITH DIFFUSE BRONCHIAL WALL THICKENING. Moderate to large right pleural effusion is identified with overlying pleural thickening and compressive type atelectasis of the right lower lobe. Large left pleural effusion is identified. There is complete atelectasis of the left lower lobe and partial atelectasis of the left upper lobe. Opacification of the left lower lobe bronchus is identified which may reflect underlying mucus plugging, aspiration, or endobronchial lesion. Subpleural consolidation and/or atelectasis within the medial right. 5 mm subpleural nodule in the anterior right upper lobe, image 52/4. Not present on the previous examination. Right lower lobe lung nodule measures 4 mm, image 94/4. This is unchanged when compared with the previous exam. No pneumothorax. SOFT TISSUES/BONES: No acute abnormality of the bones or soft tissues. UPPER ABDOMEN: Limited images of the upper abdomen demonstrates no acute abnormality. IMPRESSION: 1. Large left pleural effusion with complete atelectasis of the left lower lobe and partial atelectasis of the left upper lobe. 2. Opacification of the left lower lobe bronchus which may reflect mucus plugging, aspiration, or an endobronchial lesion. Follow-up imaging is advised to ensure resolution. If this does not resolve then repeat contrast-enhanced CT of the chest or bronchoscopy is recommended to assess for underlying malignancy in this patient who is at increased risk for lung cancer. 3. Small to moderate right pleural effusion with compressive atelectasis. 4. New 5 mm subpleural nodule in the anterior right upper lobe.In a patient who was at low risk, no further follow-up is indicated. If the patient is at increased risk a follow-up CT of the chest without contrast material in 12 months may be considered 5. Cardiac enlargement, aortic  atherosclerosis, and coronary artery calcifications. Electronically signed by: Waddell Calk MD 10/12/2024 06:40 AM EDT RP Workstation: [id number]   ECHOCARDIOGRAM COMPLETE Result Date: 10/11/2024    ECHOCARDIOGRAM REPORT   Patient Name:   VERDELLA LAIDLAW Date of Exam: 10/11/2024 Medical Rec #:  [id number]       Height:       65.0 in Accession #:    [id number]      Weight:       227.3 lb Date of Birth:  1956-11-21       BSA:          2.089 m Patient Age:    68 years        BP:           110/85 mmHg Patient Gender: F               HR:           97 bpm. Exam Location:  Inpatient Procedure: 2D Echo and Intracardiac Opacification Agent (Both Spectral and Color            Flow Doppler were utilized during procedure). Indications:    CHF  History:        Patient has prior history of Echocardiogram examinations. CHF.  Sonographer:    Norleen Amour Referring Phys: MIGNON ONEIDA BUMP IMPRESSIONS  1. Takatsubo like ventrical with akinesis of mid/apical walls and hyperdynamic basal function . Left ventricular ejection fraction, by estimation, is 25 to 30%. The left ventricle has severely decreased function. The left ventricle has no regional wall motion abnormalities. The left ventricular internal cavity size was severely dilated. Left ventricular diastolic parameters  are indeterminate.  2. Right ventricular systolic function is normal. The right ventricular size is normal.  3. Left atrial size was mildly dilated.  4. The mitral valve is abnormal. Trivial mitral valve regurgitation. No evidence of mitral stenosis. Moderate mitral annular calcification.  5. The aortic valve is tricuspid. There is moderate calcification of the aortic valve. There is moderate thickening of the aortic valve. Aortic valve regurgitation is moderate. Aortic valve sclerosis is present, with no evidence of aortic valve stenosis.  6. The inferior vena cava is dilated in size with >50% respiratory variability, suggesting right atrial pressure of 8 mmHg.  FINDINGS  Left Ventricle: Takatsubo like ventrical with akinesis of mid/apical walls and hyperdynamic basal function. Left ventricular ejection fraction, by estimation, is 25 to 30%. The left ventricle has severely decreased function. The left ventricle has no regional wall motion abnormalities. Definity contrast agent was given IV to delineate the left ventricular endocardial borders. Strain was performed and the global longitudinal strain is indeterminate. The left ventricular internal cavity size was severely dilated. There is no left ventricular hypertrophy. Left ventricular diastolic parameters are indeterminate. Right Ventricle: The right ventricular size is normal. No increase in right ventricular wall thickness. Right ventricular systolic function is normal. Left Atrium: Left atrial size was mildly dilated. Right Atrium: Right atrial size was normal in size. Pericardium: There is no evidence of pericardial effusion. Mitral Valve: The mitral valve is abnormal. There is mild thickening of the mitral valve leaflet(s). There is mild calcification of the mitral valve leaflet(s). Moderate mitral annular calcification. Trivial mitral valve regurgitation. No evidence of mitral valve stenosis. Tricuspid Valve: The tricuspid valve is normal in structure. Tricuspid valve regurgitation is mild . No evidence of tricuspid stenosis. Aortic Valve: The aortic valve is tricuspid. There is moderate calcification of the aortic valve. There is moderate thickening of the aortic valve. Aortic valve regurgitation is moderate. Aortic valve sclerosis is present, with no evidence of aortic valve stenosis. Pulmonic Valve: The pulmonic valve was normal in structure. Pulmonic valve regurgitation is not visualized. No evidence of pulmonic stenosis. Aorta: The aortic root is normal in size and structure. Venous: The inferior vena cava is dilated in size with greater than 50% respiratory variability, suggesting right atrial pressure of 8  mmHg. IAS/Shunts: No atrial level shunt detected by color flow Doppler. Additional Comments: 3D was performed not requiring image post processing on an independent workstation and was indeterminate.  LEFT VENTRICLE PLAX 2D LVIDd:         4.50 cm      Diastology LVIDs:         2.50 cm      LV e' medial:    8.70 cm/s LV PW:         0.90 cm      LV E/e' medial:  12.5 LV IVS:        0.90 cm      LV e' lateral:   6.53 cm/s LVOT diam:     1.80 cm      LV E/e' lateral: 16.7 LV SV:         47 LV SV Index:   23 LVOT Area:     2.54 cm  LV Volumes (MOD) LV vol d, MOD A2C: 151.0 ml LV vol d, MOD A4C: 165.0 ml LV vol s, MOD A2C: 103.0 ml LV vol s, MOD A4C: 55.1 ml LV SV MOD A2C:     48.0 ml LV SV MOD A4C:  165.0 ml LV SV MOD BP:      84.5 ml RIGHT VENTRICLE             IVC RV Basal diam:  3.60 cm     IVC diam: 2.00 cm RV S prime:     10.20 cm/s TAPSE (M-mode): 1.8 cm LEFT ATRIUM              Index        RIGHT ATRIUM           Index LA diam:        3.90 cm  1.87 cm/m   RA Area:     14.30 cm LA Vol (A2C):   113.0 ml 54.10 ml/m  RA Volume:   35.30 ml  16.90 ml/m LA Vol (A4C):   91.5 ml  43.81 ml/m LA Biplane Vol: 103.0 ml 49.31 ml/m  AORTIC VALVE             PULMONIC VALVE LVOT Vmax:   112.00 cm/s PV Vmax:       0.99 m/s LVOT Vmean:  76.700 cm/s PV Peak grad:  3.9 mmHg LVOT VTI:    0.186 m  AORTA Ao Root diam: 3.10 cm Ao Asc diam:  2.90 cm MITRAL VALVE MV Area (PHT): 2.95 cm     SHUNTS MV Decel Time: 257 msec     Systemic VTI:  0.19 m MV E velocity: 109.00 cm/s  Systemic Diam: 1.80 cm MV A velocity: 103.00 cm/s MV E/A ratio:  1.06 Maude Emmer MD Electronically signed by Maude Emmer MD Signature Date/Time: 10/11/2024/3:42:14 PM    Final    DG CHEST PORT 1 VIEW Result Date: 10/10/2024 CLINICAL DATA:  Respiratory distress. EXAM: PORTABLE CHEST 1 VIEW COMPARISON:  10/09/2024 FINDINGS: Right-sided PICC line with tip over the SVC unchanged. Lungs are adequately inflated and demonstrate stable to slight worsening of a  large left pleural effusion with associated atelectasis. Right lung essentially clear. Cardiomediastinal silhouette and remainder the exam is unchanged. IMPRESSION: Stable to slight worsening of a large left pleural effusion with associated atelectasis. Electronically Signed   By: Toribio Agreste M.D.   On: 10/10/2024 11:48   DG Chest Port 1 View Result Date: 10/09/2024 CLINICAL DATA:  Follow-up left pleural effusion. Recent thoracentesis. EXAM: PORTABLE CHEST 1 VIEW COMPARISON:  10/09/2024 FINDINGS: Moderate left pleural effusion is seen but significantly decreased in size since previous study. A small 5-10% left apical pneumothorax is seen. Cardiomegaly remains stable. Diffuse interstitial infiltrates are again seen, consistent with interstitial edema. IMPRESSION: Significantly decreased size of moderate left pleural effusion since previous study. Small 5-10% left apical pneumothorax. Stable cardiomegaly and diffuse interstitial edema. Electronically Signed   By: Norleen DELENA Kil M.D.   On: 10/09/2024 17:59   US  THORACENTESIS ASP PLEURAL SPACE W/IMG GUIDE Result Date: 10/09/2024 INDICATION: Patient movement with altered mental status, sepsis found to have loculated left pleural effusion status post 200 mL thoracentesis on 10/07/24. Request for therapeutic left thoracentesis EXAM: ULTRASOUND GUIDED LEFT THORACENTESIS MEDICATIONS: 5 mL 1% lidocaine  COMPLICATIONS: None immediate. PROCEDURE: An ultrasound guided thoracentesis was thoroughly discussed with the patient and questions answered. The benefits, risks, alternatives and complications were also discussed. The patient understands and wishes to proceed with the procedure. Written consent was obtained. Ultrasound was performed to localize and mark an adequate pocket of fluid in the left chest. The area was then prepped and draped in the normal sterile fashion. 1% Lidocaine  was used for local anesthesia. Under ultrasound guidance a  6 Fr Safe-T-Centesis catheter  was introduced. Thoracentesis was performed. The catheter was removed and a dressing applied. FINDINGS: A total of approximately 350 mL of clear, dark yellow fluid was removed. IMPRESSION: Successful ultrasound guided left thoracentesis yielding 350 mL of pleural fluid. Performed by Clotilda Hesselbach, PA-C Electronically Signed   By: Ester Sides M.D.   On: 10/09/2024 16:14   DG CHEST PORT 1 VIEW Result Date: 10/09/2024 CLINICAL DATA:  Dyspnea. EXAM: PORTABLE CHEST 1 VIEW COMPARISON:  10/08/2024 FINDINGS: Persistent whiteout of the left hemithorax without substantial shift of cardiomediastinal anatomy. Subtle interstitial opacity in the right lung raises the question of edema. Hazy opacity over the right base may reflect layering pleural fluid. Right PICC line remains in place with tip overlying the lower SVC level. Telemetry leads overlie the chest. IMPRESSION: 1. Persistent whiteout of the left hemithorax without substantial shift of cardiomediastinal anatomy. 2. Subtle interstitial opacity in the right lung raises the question of edema. Electronically Signed   By: Camellia Candle M.D.   On: 10/09/2024 07:13   DG Chest 1 View Result Date: 10/08/2024 EXAM: 1 VIEW(S) XRAY OF THE CHEST 10/08/2024 04:39:00 AM COMPARISON: 10/07/2024 CLINICAL HISTORY: Shortness of breath FINDINGS: LINES, TUBES AND DEVICES: Stable right PICC line. LUNGS AND PLEURA: Complete opacification of left hemithorax, increased since prior exam. Interstitial opacities in right lung suggesting pulmonary edema. Trace right pleural effusion. No pneumothorax. HEART AND MEDIASTINUM: No acute abnormality of the cardiac and mediastinal silhouettes. BONES AND SOFT TISSUES: No acute osseous abnormality. IMPRESSION: 1. Complete opacification of left hemithorax, increased since prior exam. 2. Trace right pleural effusion. 3. Interstitial opacities in right lung suggesting pulmonary edema. Electronically signed by: Evalene Coho MD 10/08/2024 04:56  AM EDT RP Workstation: [id number]   DG Chest Port 1 View Result Date: 10/07/2024 EXAM: 1 VIEW(S) XRAY OF THE CHEST 10/07/2024 01:16:00 PM COMPARISON: Yesterday's study. CLINICAL HISTORY: [id number] Status post thoracentesis [id number]. S/p thoracentesis. FINDINGS: LUNGS AND PLEURA: Large left pleural effusion is noted which is slightly decreased compared to prior exam. No pneumothorax. HEART AND MEDIASTINUM: No acute abnormality of the cardiac and mediastinal silhouettes. BONES AND SOFT TISSUES: No acute osseous abnormality. IMPRESSION: 1. Large left pleural effusion, slightly decreased from prior. 2. No pneumothorax. Electronically signed by: Lynwood Seip MD 10/07/2024 01:35 PM EDT RP Workstation: [id number]   US  THORACENTESIS ASP PLEURAL SPACE W/IMG GUIDE Result Date: 10/07/2024 INDICATION: 68 year old female. Extensive medical history. Found to have a left-sided pleural effusion. Request is for therapeutic and diagnostic thoracentesis EXAM: ULTRASOUND GUIDED THERAPEUTIC AND DIAGNOSTIC LEFT-SIDED THORACENTESIS MEDICATIONS: Lidocaine  1% 10 mL COMPLICATIONS: None immediate. PROCEDURE: An ultrasound guided thoracentesis was thoroughly discussed with the patient and questions answered. The benefits, risks, alternatives and complications were also discussed. The patient understands and wishes to proceed with the procedure. Written consent was obtained. Ultrasound was performed to localize and mark an adequate pocket of fluid in the left chest. The area was then prepped and draped in the normal sterile fashion. 1% Lidocaine  was used for local anesthesia. Under ultrasound guidance a 6 Fr Safe-T-Centesis catheter was introduced. Thoracentesis was performed. The catheter was removed and a dressing applied. FINDINGS: Found to have a loculated pleural effusion. A total of approximately 200 mL of amber colored fluid was removed. Samples were sent to the laboratory as requested by the clinical team. IMPRESSION: Successful  ultrasound guided therapeutic and diagnostic right-sided thoracentesis yielding 200 mL of amber colored pleural fluid. Read by: Delon Beagle, NP Electronically Signed   By: Wilkie  Karalee M.D.   On: 10/07/2024 13:21   CT ABDOMEN PELVIS WO CONTRAST Result Date: 10/06/2024 EXAM: CT ABDOMEN AND PELVIS WITHOUT CONTRAST 10/06/2024 10:52:34 PM TECHNIQUE: CT of the abdomen and pelvis was performed without the administration of intravenous contrast. Multiplanar reformatted images are provided for review. Automated exposure control, iterative reconstruction, and/or weight-based adjustment of the mA/kV was utilized to reduce the radiation dose to as low as reasonably achievable. COMPARISON: CTA abdomen and pelvis 07/01/2024, CTA abdomen and pelvis 06/29/2024. CLINICAL HISTORY: Sepsis. AMS, SESPSIS. FINDINGS: LOWER CHEST: A large left pleural effusion is again noted, collapsing the lingular base and left lower lobe. A moderate right pleural effusion partially collapses the right lower lobe. Underlying pneumonia would be difficult to exclude. The mid to anterior right lung base is clear. Mild cardiomegaly. The lower mitral ring is heavily calcified. There is no substantial pericardial effusion. LIVER: There is slight nodularity of the left lobe of the liver concerning for early cirrhosis. The liver is mildly steatotic measuring 20 cm in length. No mass is seen without contrast. GALLBLADDER AND BILE DUCTS: The gallbladder is absent. No biliary ductal dilatation. SPLEEN: No focal abnormality of the unenhanced spleen. PANCREAS: No focal abnormality of the unenhanced pancreas. ADRENAL GLANDS: There is no adrenal mass. KIDNEYS, URETERS AND BLADDER: No contour deforming mass of the unenhanced kidneys. Previously there was an irregular 4 cm cystic abnormality of the lower lateral right kidney, which is not seen today, and may have had an infectious etiology. Without contrast, I could not confirm full resolution of the  abnormality. There is an 8 mm nonobstructive calculus in a calyceal infundibulum in the mid pole left kidney, smaller stones posterior to it measuring 4 mm and 2 mm. No further nephrolithiasis is seen. No hydronephrosis or ureteral stone. No perinephric or periureteral stranding. There are multiple tiny layering stones along the right posterior bladder wall. The bladder thickness is normal. Unsure if the stones were present previously because there was contrast in the bladder on both prior studies and bladder decompression due to catheterization. GI AND BOWEL: The stomach is contracted, unchanged. There previously was a drain in between the stomach and liver, which has been removed. There is a small stable duodenal diverticulum. 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 The small bowel is normal caliber. 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 A rather large ventral hernia is chronically seen with herniation of mesentery, small and large bowel with wide rectus diastasis. The appendix has been removed. No bowel obstruction or inflammation is suspected. PERITONEUM AND RETROPERITONEUM: Trace ascites in the posterior pelvis similar to prior study. There is no free air, free hemorrhage, or abscess. VASCULATURE: Aorta is normal in caliber. There is moderate to heavy aortoiliac calcification without aneurysm. Pelvic phleboliths. LYMPH NODES: No adenopathy is seen. REPRODUCTIVE ORGANS: No acute abnormality. The uterus is surgically absent. No adnexal mass. BONES AND SOFT TISSUES: Sacral decubitus ulcer is similar to prior exams. There is osteopenia, levoscoliosis, and advanced degenerative change of the lumbar spine. Multilevel lumbar acquired spinal stenosis. No acute osseous abnormality. No  other  focal soft tissue abnormality. IMPRESSION: 1. Large left pleural effusion collapsing the lingular base and left lower lobe, and moderate right pleural effusion partially collapsing the right lower lobe. Underlying pneumonia would be difficult to exclude.  2. Slight nodularity of the left hepatic lobe concerning for early cirrhosis; mild hepatic steatosis. No hepatic mass identified on this noncontrast exam. 3. Nonobstructive left renal calculi measuring up to 8 mm; multiple small layering calculi along the right posterior bladder wall. No hydronephrosis or ureteral stone. 4. Advanced degenerative change of the lumbar spine with multilevel acquired spinal stenosis. SABRA 5. Prior finding of a 4 cm complex cystic abnormality of the lower lateral right kidney is not seen today, but full resolution is not confirmed without contrast. 6. 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 No bowel obstruction or inflammatory process identified. 7. Large ventral hernia containing mesentery and bowel with wide rectus diastasis. 8. Minimal pelvic ascites. Electronically signed by: Francis Quam MD 10/06/2024 11:30 PM EDT RP Workstation: [id number]   CT Head Wo Contrast Result Date: 10/06/2024 EXAM: CT HEAD WITHOUT CONTRAST 10/06/2024 10:52:34 PM TECHNIQUE: CT of the head was performed without the administration of intravenous contrast. Automated exposure control, iterative reconstruction, and/or weight based adjustment of the mA/kV was utilized to reduce the radiation dose to as low as reasonably achievable. COMPARISON: None available. CLINICAL HISTORY: Mental status change, unknown cause. AMS, SESPSIS. FINDINGS: BRAIN AND VENTRICLES: There is mild cerebral atrophy, small vessel disease, and atrophic ventriculomegaly. There are multiple scattered subcentimeter parafalcine lipomas along the frontal falx. Cerebellum and brainstem are unremarkable. No cortical-based  acute infarct or hemorrhage. No extra-axial collection. No mass effect or midline shift. Basal cisterns are clear. There are patchy calcifications of the carotid siphons. No hyperdense central vessels. ORBITS: The orbits are unremarkable. SINUSES: The sinuses are clear. SOFT TISSUES AND SKULL: No acute soft tissue abnormality. No skull fractures or focal skull lesions noted. Diffuse bilateral mastoid effusions. Both middle ear cavities remain clear. IMPRESSION: 1. No acute intracranial CT findings. Mild cerebral atrophy and small vessel changes. 2. Multiple subcentimeter parafalcine lipomas. 3. Diffuse bilateral mastoid effusions. Clear sinuses and middle ear cavities. Electronically signed by: Francis Quam MD 10/06/2024 11:02 PM EDT RP Workstation: [id number]   DG CHEST PORT 1 VIEW Result Date: 10/06/2024 CLINICAL DATA:  PICC placement EXAM: PORTABLE CHEST 1 VIEW COMPARISON:  10/06/2024 FINDINGS: Right PICC line placement with the tip in the SVC. New complete opacification of the left hemithorax. Interstitial prominence throughout the right lung. This may reflect interstitial edema. No acute bony abnormality. IMPRESSION: Right PICC line tip in the SVC. New complete opacification of the left hemithorax. Interstitial prominence in the right lung may reflect interstitial edema. Electronically Signed   By: Franky Crease M.D.   On: 10/06/2024 19:08   US  EKG SITE RITE Result Date: 10/06/2024 If Site Rite image not attached, placement could not be confirmed due to current cardiac rhythm.  DG Chest Port 1 View Result Date: 10/06/2024 CLINICAL DATA:  Questionable sepsis - evaluate for abnormality EXAM: PORTABLE CHEST 1 VIEW COMPARISON:  07/08/2024 FINDINGS: Moderate left pleural effusion, slightly decreased since prior study. Left lower lobe opacity, likely atelectasis. Right lung clear. Heart normal size. No acute bony abnormality. IMPRESSION: Moderate left pleural effusion with left lower lobe atelectasis.  Electronically Signed   By: Franky Crease M.D.   On: 10/06/2024 00:25    Microbiology: Recent Results (from the past 240 hours)  Urine Culture     Status: Abnormal   Collection Time: 10/06/24 11:20 PM   Specimen: Urine, Random  Result Value Ref Range Status   Specimen Description   Final    URINE, RANDOM Performed  at [facility], [address], [place], [place] [address]    Special Requests   Final    NONE Reflexed from [id number] Performed at [facility], [address], [place], [place] [address]    Culture (A)  Final    30,000 COLONIES/mL ENTEROCOCCUS FAECALIS VANCOMYCIN  RESISTANT ENTEROCOCCUS    Report Status 10/09/2024 FINAL  Final   Organism ID, Bacteria ENTEROCOCCUS FAECALIS (A)  Final      Susceptibility   Enterococcus faecalis - MIC*    AMPICILLIN <=2 SENSITIVE Sensitive     NITROFURANTOIN <=16 SENSITIVE Sensitive     VANCOMYCIN  >=32 RESISTANT Resistant     * 30,000 COLONIES/mL ENTEROCOCCUS FAECALIS  Gram stain     Status: None   Collection Time: 10/07/24 12:45 PM   Specimen: Pleura  Result Value Ref Range Status   Specimen Description PLEURAL  Final   Special Requests PLEURAL  Final   Gram Stain   Final    NO ORGANISMS SEEN WBC PRESENT, PREDOMINANTLY PMN Performed at [facility], [address] [place], [place] [address]    Report Status 10/07/2024 FINAL  Final  Culture, body fluid w Gram Stain-bottle     Status: None   Collection Time: 10/07/24 12:45 PM   Specimen: Pleura  Result Value Ref Range Status   Specimen Description PLEURAL LEFT  Final   Special Requests   Final    BOTTLES DRAWN AEROBIC AND ANAEROBIC Blood Culture adequate volume   Culture   Final    NO GROWTH 5 DAYS Performed at [facility], [address] [place], [place] [address]    Report Status 10/12/2024 FINAL  Final    Time spent: 15 minutes  Signed: Burgess JAYSON Dare, MD 10/27/2024

## 2024-10-24 NOTE — Progress Notes (Signed)
 PROGRESS NOTE    Christina Castaneda  FMW:[id number] DOB: 12/31/1955 DOA: 10/05/2024 PCP: [name], DO    Brief Narrative:   68 year old F with PMH of COPD, OSA, hypoxic RF on 3 L, perforated sigmoid colon in the setting of diverticulitis s/p colostomy in 2017, duodenal ulcer perforation s/p ex lap and repair, prior tracheostomy, CKD-2 and morbid obesity admitted to [facility] from SNF facility on 10/06/2024 with severe sepsis septic shock due to empyema/loculated pleural effusion with acute hypoxic respiratory failure.    Patient was started on vasopressor, stress dose steroid, broad-spectrum antibiotics and admitted to ICU at [facility].  She underwent right thoracocentesis on 10/15 with removal of 200 cc exudative and culture negative amber-colored fluid She also underwent left thoracocentesis on 10/17 with removal of 350 cc clear of fluid.  No malignant cells in pleural fluid.   Patient was weaned off vasopressor but remained on stress dose steroid.  She has worsening respiratory distress and confusion.  After consultation with PCCM, she was transferred to [facility] for further evaluation.   Increased respiratory distress requiring up to 50 L by HFNC.  Also ongoing delirium and agitation requiring restraints.   Extensive discussion with patient's husband, Mr. Mowers over the phone on 10/11/2024.  Updated Mr. Smiley with patient's condition and overall poor prognosis.  We have discussed about treatment options including continuation of current treatment or full comfort care. Per Mr. Sean they have no children.  He stated that she has suffered a lot.  He says I do not want to lose her but I also do not want her to continue suffering.  I think she is ready.  Make her comfortable.  I have explained what comfort care entails including use of medication such as IV morphine  sev alleviate pain, air hunger, anxiety and agitation.  He voiced understanding and appreciated the call.      Patient transition to full comfort care on 10/19.  Anticipate in-hospital death.  Palliative following.  Assessment & Plan:  End of Life/Comfort Measures Septic shock secondary to empyema/loculated pleural effusion, POA Acute congestive heart failure reduced EF, 30% Acute respiratory failure with hypoxia requiring 50L HFNC History of perforated duodenal ulcer Hypothyroidism  -Unfortunately patient's condition has significantly progressed and continues to do very poorly.  Currently full comfort care anticipating in-hospital death.    Patient is comfort care  Subjective: Resting no complaints   Examination:  General exam: No meaningful interaction Respiratory system: Clear to auscultation. Respiratory effort normal. Cardiovascular system: S1 & S2 heard, RRR. No JVD, murmurs, rubs, gallops or clicks. No pedal edema. Gastrointestinal system: Abdomen is nondistended, soft and nontender. No organomegaly or masses felt. Normal bowel sounds heard. Central nervous system: Alert and oriented. No focal neurological deficits. Extremities: Symmetric 5 x 5 power. Skin: No rashes, lesions or ulcers Psychiatry: Judgement and insight appear poor            Wound 10/06/24 0430 Pressure Injury Sacrum Mid Stage 4 - Full thickness tissue loss with exposed bone, tendon or muscle. (Active)       Objective: Vitals:   10/11/24 2245 10/12/24 0730 10/13/24 0748 2024-10-25 0709  BP: 117/66 114/68 (!) 163/97   Pulse: 75 (!) 42  (P) 62  Resp: 20 18 (!) 22 (!) (P) 22  Temp:  (!) 97 F (36.1 C) 97.6 F (36.4 C)   TempSrc:  Oral Axillary (P) Axillary  SpO2: 95% (!) 70% 100% (P) 95%  Weight:  Height:       No intake or output data in the 24 hours ending 11/07/2024 1230 Filed Weights   10/06/24 0002 10/07/24 0600  Weight: 108 kg 103.1 kg    Scheduled Meds:  LORazepam  1-2 mg Intravenous Q4H    morphine  injection  2 mg Intravenous Q4H   Continuous Infusions:  Nutritional  status     Body mass index is 37.82 kg/m.  Data Reviewed:   CBC: Recent Labs  Lab 10/08/24 0443 10/09/24 0332 10/10/24 0247 10/10/24 0920 10/11/24 0900  WBC 13.6* 10.8* 10.4 9.1 11.5*  HGB 8.0* 7.8* 8.8* 8.5* 9.0*  HCT 26.9* 25.7* 28.6* 28.0* 29.4*  MCV 91.2 89.9 88.5 89.7 88.8  PLT 288 263 328 331 345   Basic Metabolic Panel: Recent Labs  Lab 10/09/24 0332 10/09/24 0333 10/10/24 0247 10/10/24 0920 10/11/24 0900  NA 134* 135 134* 133* 134*  K 3.3* 3.2* 3.0* 3.1* 2.4*  CL 97* 97* 95* 96* 96*  CO2 29 29 26 27 27   GLUCOSE 86 87 101* 91 84  BUN 19 20 20 19 20   CREATININE 0.78 0.78 0.98 0.93 0.99  CALCIUM  9.3 9.4 9.6 9.3 9.4  MG  --   --   --  1.6* 1.8  PHOS  --  3.0  --  2.7  --    GFR: Estimated Creatinine Clearance: 62.9 mL/min (by C-G formula based on SCr of 0.99 mg/dL). Liver Function Tests: Recent Labs  Lab 10/09/24 0333 10/10/24 0920 10/11/24 0900  AST  --   --  21  ALT  --   --  11  ALKPHOS  --   --  58  BILITOT  --   --  0.7  PROT  --   --  6.1*  ALBUMIN  2.1* <1.5* 1.6*   No results for input(s): LIPASE, AMYLASE in the last 168 hours. No results for input(s): AMMONIA in the last 168 hours. Coagulation Profile: No results for input(s): INR, PROTIME in the last 168 hours. Cardiac Enzymes: No results for input(s): CKTOTAL, CKMB, CKMBINDEX, TROPONINI in the last 168 hours. BNP (last 3 results) No results for input(s): PROBNP in the last 8760 hours. HbA1C: No results for input(s): HGBA1C in the last 72 hours. CBG: No results for input(s): GLUCAP in the last 168 hours. Lipid Profile: No results for input(s): CHOL, HDL, LDLCALC, TRIG, CHOLHDL, LDLDIRECT in the last 72 hours. Thyroid  Function Tests: No results for input(s): TSH, T4TOTAL, FREET4, T3FREE, THYROIDAB in the last 72 hours. Anemia Panel: No results for input(s): VITAMINB12, FOLATE, FERRITIN, TIBC, IRON, RETICCTPCT in the last 72  hours. Sepsis Labs: No results for input(s): PROCALCITON, LATICACIDVEN in the last 168 hours.  Recent Results (from the past 240 hours)  Blood Culture (routine x 2)     Status: Abnormal   Collection Time: 10/05/24 11:53 PM   Specimen: BLOOD  Result Value Ref Range Status   Specimen Description   Final    BLOOD BLOOD RIGHT FOREARM Performed at [facility], [address] [place], [place] [address]    Special Requests   Final    BOTTLES DRAWN AEROBIC AND ANAEROBIC Blood Culture adequate volume Performed at [facility], [address] [place], [place] [address]    Culture  Setup Time   Final    AEROBIC BOTTLE ONLY GRAM POSITIVE RODS Gram Stain Report Called to,Read Back By and Verified With: JAYSON AHLE RN [phone] MARLA CAROLIN Performed at [facility], [address] [place], [place]  [address]    Culture (A)  Final    CORYNEBACTERIUM SPECIES CORYNEBACTERIUM OTITIDIS Performed at [facility], [address], [place], [place] [address]    Report Status 10/10/2024 FINAL  Final  Blood Culture (routine x 2)     Status: None   Collection Time: 10/06/24 12:38 AM   Specimen: BLOOD  Result Value Ref Range Status   Specimen Description BLOOD LEFT ANTECUBITAL  Final   Special Requests   Final    BOTTLES DRAWN AEROBIC AND ANAEROBIC Blood Culture results may not be optimal due to an inadequate volume of blood received in culture bottles   Culture   Final    NO GROWTH 5 DAYS Performed at [facility], [address] [place], [place] [address]    Report Status 10/11/2024 FINAL  Final  MRSA Next Gen by PCR, Nasal     Status: None   Collection Time: 10/06/24  4:22 AM   Specimen: Nasal Mucosa; Nasal Swab  Result Value Ref Range Status   MRSA by PCR Next Gen NOT DETECTED NOT DETECTED Final    Comment: (NOTE) The GeneXpert MRSA Assay (FDA approved for NASAL specimens only), is one component of a comprehensive MRSA colonization surveillance program. It is not intended to diagnose  MRSA infection nor to guide or monitor treatment for MRSA infections. Test performance is not FDA approved in patients less than 1 years old. Performed at [facility], [address], [place], [place] [address]   Respiratory (~20 pathogens) panel by PCR     Status: None   Collection Time: 10/06/24 10:56 AM   Specimen: Nasopharyngeal Swab; Respiratory  Result Value Ref Range Status   Adenovirus NOT DETECTED NOT DETECTED Final   Coronavirus 229E NOT DETECTED NOT DETECTED Final    Comment: (NOTE) The Coronavirus on the Respiratory Panel, DOES NOT test for the novel  Coronavirus (2019 nCoV)    Coronavirus HKU1 NOT DETECTED NOT DETECTED Final   Coronavirus NL63 NOT DETECTED NOT DETECTED Final   Coronavirus OC43 NOT DETECTED NOT DETECTED Final   Metapneumovirus NOT DETECTED NOT DETECTED Final   Rhinovirus / Enterovirus NOT DETECTED NOT DETECTED Final   Influenza A NOT DETECTED NOT DETECTED Final   Influenza B NOT DETECTED NOT DETECTED Final   Parainfluenza Virus 1 NOT DETECTED NOT DETECTED Final   Parainfluenza Virus 2 NOT DETECTED NOT DETECTED Final   Parainfluenza Virus 3 NOT DETECTED NOT DETECTED Final   Parainfluenza Virus 4 NOT DETECTED NOT DETECTED Final   Respiratory Syncytial Virus NOT DETECTED NOT DETECTED Final   Bordetella pertussis NOT DETECTED NOT DETECTED Final   Bordetella Parapertussis NOT DETECTED NOT DETECTED Final   Chlamydophila pneumoniae NOT DETECTED NOT DETECTED Final   Mycoplasma pneumoniae NOT DETECTED NOT DETECTED Final    Comment: Performed at [facility], [address] [place], [place] [address]  Urine Culture     Status: Abnormal   Collection Time: 10/06/24 11:20 PM   Specimen: Urine, Random  Result Value Ref Range Status   Specimen Description   Final    URINE, RANDOM Performed at [facility], [address], [place], [place] [address]    Special Requests   Final    NONE Reflexed from [phone] [id number] Performed at [facility], [address]  [place], [place] [address]    Culture (A)  Final    30,000 COLONIES/mL ENTEROCOCCUS FAECALIS VANCOMYCIN  RESISTANT ENTEROCOCCUS    Report Status 10/09/2024 FINAL  Final   Organism ID, Bacteria  ENTEROCOCCUS FAECALIS (A)  Final      Susceptibility   Enterococcus faecalis - MIC*    AMPICILLIN <=2 SENSITIVE Sensitive     NITROFURANTOIN <=16 SENSITIVE Sensitive     VANCOMYCIN  >=32 RESISTANT Resistant     * 30,000 COLONIES/mL ENTEROCOCCUS FAECALIS  Gram stain     Status: None   Collection Time: 10/07/24 12:45 PM   Specimen: Pleura  Result Value Ref Range Status   Specimen Description PLEURAL  Final   Special Requests PLEURAL  Final   Gram Stain   Final    NO ORGANISMS SEEN WBC PRESENT, PREDOMINANTLY PMN Performed at [facility], [address], [place], [place] [address]    Report Status 10/07/2024 FINAL  Final  Culture, body fluid w Gram Stain-bottle     Status: None   Collection Time: 10/07/24 12:45 PM   Specimen: Pleura  Result Value Ref Range Status   Specimen Description PLEURAL LEFT  Final   Special Requests   Final    BOTTLES DRAWN AEROBIC AND ANAEROBIC Blood Culture adequate volume   Culture   Final    NO GROWTH 5 DAYS Performed at [facility], [address] [place], [place] [address]    Report Status 10/12/2024 FINAL  Final         Radiology Studies: No results found.         LOS: 8 days   Time spent= 35 mins    Burgess JAYSON Dare, MD [facility]  If 7PM-7AM, please contact night-coverage  2024/10/20, 12:30 PM

## 2024-10-24 NOTE — Hospital Course (Addendum)
 Brief Narrative:   68 year old F with PMH of COPD, OSA, hypoxic RF on 3 L, perforated sigmoid colon in the setting of diverticulitis s/p colostomy in 2017, duodenal ulcer perforation s/p ex lap and repair, prior tracheostomy, CKD-2 and morbid obesity admitted to [facility] from SNF facility on 10/06/2024 with severe sepsis septic shock due to empyema/loculated pleural effusion with acute hypoxic respiratory failure.    Patient was started on vasopressor, stress dose steroid, broad-spectrum antibiotics and admitted to ICU at [facility].  She underwent right thoracocentesis on 10/15 with removal of 200 cc exudative and culture negative amber-colored fluid She also underwent left thoracocentesis on 10/17 with removal of 350 cc clear of fluid.  No malignant cells in pleural fluid.   Patient was weaned off vasopressor but remained on stress dose steroid.  She has worsening respiratory distress and confusion.  After consultation with PCCM, she was transferred to [facility] for further evaluation.   Increased respiratory distress requiring up to 50 L by HFNC.  Also ongoing delirium and agitation requiring restraints.   Extensive discussion with patient's husband, Mr. Rawlinson over the phone on 11-01-2024.  Updated Mr. Smiley with patient's condition and overall poor prognosis.  We have discussed about treatment options including continuation of current treatment or full comfort care. Per Mr. Belnap they have no children.  He stated that she has suffered a lot.  He says I do not want to lose her but I also do not want her to continue suffering.  I think she is ready.  Make her comfortable.  I have explained what comfort care entails including use of medication such as IV morphine  sev alleviate pain, air hunger, anxiety and agitation.  He voiced understanding and appreciated the call.     Patient transition to full comfort care on 01-Nov-2024. Passed away in the hospital  Assessment & Plan:  End of  Life/Comfort Measures Septic shock secondary to empyema/loculated pleural effusion, POA Acute congestive heart failure reduced EF, 30% Acute respiratory failure with hypoxia requiring 50L HFNC History of perforated duodenal ulcer Hypothyroidism  -Unfortunately patient's condition has significantly progressed and continues to do very poorly.  Currently full comfort care anticipating in-hospital death.    Patient is comfort care  Subjective: Resting no complaints   Examination:  General exam: No meaningful interaction Respiratory system: Clear to auscultation. Respiratory effort normal. Cardiovascular system: S1 & S2 heard, RRR. No JVD, murmurs, rubs, gallops or clicks. No pedal edema. Gastrointestinal system: Abdomen is nondistended, soft and nontender. No organomegaly or masses felt. Normal bowel sounds heard. Central nervous system: Alert and oriented. No focal neurological deficits. Extremities: Symmetric 5 x 5 power. Skin: No rashes, lesions or ulcers Psychiatry: Judgement and insight appear poor

## 2024-10-24 NOTE — Progress Notes (Signed)
 Palliative:  HPI: 68 y.o. female with past medical history of COPD, asthma, atypical endometrial hyperplasia, goiter, gout, hypertension, hypothyroidism, and vitamin B12 deficiency, prolonged and complicated admission from 06/07/2024 through 07/10/2024 after she sustained a fall with left femur fracture admitted on 10/05/2024 with fever and confusion. Ongoing decline over course of hospitalization with respiratory failure with left empyema, COPD, and OSA. Decision made for comfort care 10/19.   I met today at [facility] bedside. No family/visitors present. Discussed with RN. Yazmen appears comfortable with current medication regimen - no changes made. She is exhibiting signs of progression at end of life. Jaw/facial muscles more withdrawn and relaxed. Hypotensive. Feet cool to touch. Breathing pattern with some accessory muscle use - low threshold to provide PRN medication to further ensure comfort if worsens. Breathing regular with no apnea noted. Although breathing does appear to becoming more agonal in nature. I would not be surprised if she died over the next hours to 24 hours. Will be available as needed. Continue full comfort care. Anticipate hospital death.   Exam: Progressing towards EOL. Regular but agonal breathes. More relaxed - not tense. Only response was slight flinch to touch.   Plan: - DNR - Full comfort care - Anticipate hospital death  25 min  Bernarda Kitty, NP Palliative Medicine Team Pager [phone] (Please see [email or web address] for schedule) Team Phone [phone]

## 2024-10-24 DEATH — deceased
